# Patient Record
Sex: Female | Born: 1960
Health system: Southern US, Community
[De-identification: ages and names within clinical notes are randomized; demographics above are authoritative.]

## PROBLEM LIST (undated history)

## (undated) DIAGNOSIS — K219 Gastro-esophageal reflux disease without esophagitis: Secondary | ICD-10-CM

## (undated) DIAGNOSIS — G35 Multiple sclerosis: Secondary | ICD-10-CM

## (undated) DIAGNOSIS — K859 Acute pancreatitis without necrosis or infection, unspecified: Secondary | ICD-10-CM

## (undated) DIAGNOSIS — H9190 Unspecified hearing loss, unspecified ear: Secondary | ICD-10-CM

## (undated) DIAGNOSIS — H539 Unspecified visual disturbance: Secondary | ICD-10-CM

## (undated) DIAGNOSIS — K209 Esophagitis, unspecified without bleeding: Secondary | ICD-10-CM

## (undated) DIAGNOSIS — J45909 Unspecified asthma, uncomplicated: Secondary | ICD-10-CM

## (undated) DIAGNOSIS — R51 Headache: Secondary | ICD-10-CM

## (undated) DIAGNOSIS — I10 Essential (primary) hypertension: Secondary | ICD-10-CM

## (undated) DIAGNOSIS — G35D Multiple sclerosis, unspecified: Secondary | ICD-10-CM

## (undated) DIAGNOSIS — F1010 Alcohol abuse, uncomplicated: Secondary | ICD-10-CM

## (undated) DIAGNOSIS — R519 Headache, unspecified: Secondary | ICD-10-CM

## (undated) HISTORY — DX: Essential (primary) hypertension: I10

## (undated) HISTORY — DX: Gastro-esophageal reflux disease without esophagitis: K21.9

## (undated) HISTORY — DX: Multiple sclerosis: G35

## (undated) HISTORY — PX: CHOLECYSTECTOMY: SHX55

## (undated) HISTORY — PX: CARPAL TUNNEL RELEASE: SHX101

## (undated) HISTORY — DX: Unspecified hearing loss, unspecified ear: H91.90

## (undated) HISTORY — PX: ULNAR NERVE REPAIR: SHX2594

## (undated) HISTORY — DX: Multiple sclerosis, unspecified: G35.D

## (undated) HISTORY — DX: Headache, unspecified: R51.9

## (undated) HISTORY — PX: ANKLE FRACTURE SURGERY: SHX122

## (undated) HISTORY — DX: Unspecified visual disturbance: H53.9

## (undated) HISTORY — DX: Headache: R51

---

## 2009-08-20 HISTORY — PX: LAPAROSCOPIC GASTRIC SLEEVE RESECTION: SHX5895

## 2014-01-13 DIAGNOSIS — H9190 Unspecified hearing loss, unspecified ear: Secondary | ICD-10-CM | POA: Insufficient documentation

## 2014-01-13 DIAGNOSIS — M62838 Other muscle spasm: Secondary | ICD-10-CM | POA: Insufficient documentation

## 2014-02-16 DIAGNOSIS — G35 Multiple sclerosis: Secondary | ICD-10-CM | POA: Insufficient documentation

## 2014-10-06 ENCOUNTER — Encounter: Payer: Self-pay | Admitting: *Deleted

## 2014-10-06 ENCOUNTER — Telehealth: Payer: Self-pay | Admitting: Neurology

## 2014-10-06 ENCOUNTER — Encounter: Payer: Self-pay | Admitting: Neurology

## 2014-10-06 ENCOUNTER — Telehealth: Payer: Self-pay | Admitting: *Deleted

## 2014-10-06 ENCOUNTER — Ambulatory Visit (INDEPENDENT_AMBULATORY_CARE_PROVIDER_SITE_OTHER): Payer: BLUE CROSS/BLUE SHIELD | Admitting: Neurology

## 2014-10-06 VITALS — BP 166/104 | HR 70 | Resp 14 | Ht 65.0 in | Wt 183.0 lb

## 2014-10-06 DIAGNOSIS — Z79899 Other long term (current) drug therapy: Secondary | ICD-10-CM | POA: Insufficient documentation

## 2014-10-06 DIAGNOSIS — R200 Anesthesia of skin: Secondary | ICD-10-CM | POA: Insufficient documentation

## 2014-10-06 DIAGNOSIS — G2581 Restless legs syndrome: Secondary | ICD-10-CM | POA: Insufficient documentation

## 2014-10-06 DIAGNOSIS — G35 Multiple sclerosis: Secondary | ICD-10-CM | POA: Insufficient documentation

## 2014-10-06 DIAGNOSIS — Z9884 Bariatric surgery status: Secondary | ICD-10-CM | POA: Insufficient documentation

## 2014-10-06 DIAGNOSIS — R260 Ataxic gait: Secondary | ICD-10-CM | POA: Insufficient documentation

## 2014-10-06 DIAGNOSIS — Z9889 Other specified postprocedural states: Secondary | ICD-10-CM | POA: Insufficient documentation

## 2014-10-06 DIAGNOSIS — F418 Other specified anxiety disorders: Secondary | ICD-10-CM | POA: Insufficient documentation

## 2014-10-06 DIAGNOSIS — G4700 Insomnia, unspecified: Secondary | ICD-10-CM

## 2014-10-06 DIAGNOSIS — R4189 Other symptoms and signs involving cognitive functions and awareness: Secondary | ICD-10-CM | POA: Insufficient documentation

## 2014-10-06 DIAGNOSIS — I10 Essential (primary) hypertension: Secondary | ICD-10-CM | POA: Insufficient documentation

## 2014-10-06 MED ORDER — ROPINIROLE HCL 0.5 MG PO TABS: ORAL_TABLET | ORAL | Status: DC

## 2014-10-06 MED ORDER — FLUOXETINE HCL 20 MG PO CAPS: 20.0000 mg | ORAL_CAPSULE | Freq: Every day | ORAL | Status: DC

## 2014-10-06 MED ORDER — MODAFINIL 200 MG PO TABS: 200.0000 mg | ORAL_TABLET | Freq: Every day | ORAL | Status: DC

## 2014-10-06 MED ORDER — INTERFERON BETA-1A 44 MCG/0.5ML ~~LOC~~ SOSY: 44.0000 ug | PREFILLED_SYRINGE | SUBCUTANEOUS | Status: DC

## 2014-10-06 MED ORDER — VALSARTAN 80 MG PO TABS: 80.0000 mg | ORAL_TABLET | Freq: Every day | ORAL | Status: DC

## 2014-10-06 NOTE — Telephone Encounter (Signed)
Patient called and stated she uses [facility] at [address] [place].  FYI

## 2014-10-06 NOTE — Telephone Encounter (Signed)
Request fax to [facility]  On 10/06/14 requesting records and MRi CD.

## 2014-10-06 NOTE — Telephone Encounter (Signed)
Noted in [facility] [address] in [place]/fim

## 2014-10-06 NOTE — Progress Notes (Signed)
[facility]  PATIENT: Lucita Lora DOB: [birth year]  REFERRING DOCTOR OR PCP:  No PCP, transferring care SOURCE: Patient  _________________________________   HISTORICAL  CHIEF COMPLAINT:  Chief Complaint  Patient presents with  . Multiple Sclerosis    Sts. dx. with MS approx. 2009--first followed by neuro in [place], most recently followed by Dr. Renne Crigler at [facility].  Presenting sx. were random numbness, intermittent dizziness.  MS was an incidental finding when she had an mri to investigate hearing loss.  Sts. mri an lp confirmed dx.  She initially started Avonex--she stopped this due to se (flu-like sx), and she just didn't feel any better on Avonex.  She then tried Copaxone, doesn't remember why she stopped this.  Next she tried Rebif--sts. ? ins.   . Numbness    stopped covering this just before Christmas 2015, so she has not been on any MS therapy since December.   Sts. no exacerbation of sx. since she has been off of Rebif./fim    HISTORY OF PRESENT ILLNESS:  [name] is a 54 year old woman who was diagnosed with multiple sclerosis in 2009. Before that time, she had noted some gait ataxia but had not had any imaging studies. In 2009, due to progressive hearing loss, she had an MRI of the brain. It was abnormal and consistent with multiple sclerosis. She then had a lumbar puncture that also confirmed the diagnosis. She was in [place] at the time. I have some of her early MRI reports there are several foci noted in the cervical and thoracic spinal cord in the brain, there were multiple hyperintense foci, mostly in the periventricular deep white matter many of these were contrast enhancing on 07/26/2008. There was another enhancing lesion adjacent to C2 in the spinal cord. The spinal fluid showed an increased IgG synthesis rate and oligoclonal bands. Initially, she was placed on Avonex. However, she did not feel good on that and was still in some denial about the MS  diagnosis so she stopped. She then tried Copaxone but also stopped after a while. [place] and saw Dr. Renne Crigler at [facility]. She was placed on Rebif. She tolerates Rebif well. She had her last shot in December as she is having some insurance difficulties getting her reauthorization. She has not had any shots for the past 6 weeks. She thinks back over the last 15 years she has noted several episodes where she would have numbness for a few days. Some of these episodes would be in the abdomen and upper legs. She also noted some episodes where her walking was more clumsy. I have MRI reports from 12/04/2012 from [place]. It showed multiple foci in the spinal cord at C2, C3-C4, and C4-C5. There were also foci at T4, T7, T9 and T10. The MRI of the brain showed 2 nonenhancing foci not present from studies in 2011. She reports having an MRI at [facility] sometime last year but we do not have those films.  Currently, she has several symptoms related to her multiple sclerosis. She reports gait ataxia, right leg weakness, tingling in her hands and face, fatigue, visual changes and mild cognitive impairment.  For many years she has felt her gait has been clumsy. At times this is worse. Her right leg seems to be weak at times and that contributes to some of her difficulties with her walking. She notes that sometimes she feels like she is being pushed to the left or the right  as she walks and may hit a piece of furniture or doorframe. Other times, she has noted that she has a right foot drop. Her left leg seems stronger. She notes some mild generalized weakness in her arms. Specifically, lifting something like a gallon of milk is more difficult than it used to be  She has noted tingling in her hands and face. The current numbness started yesterday and she gets episodes lasting a few days at a time several times a year. She does not have any painful numbness.  She has urinary incontinence and often  wears pads. She gets some frequency but also notes that there can be hesitancy. Often, after she urinates she feels she has to urinate again. She does not think that she completely empties the bladder. She has some nocturia.  She has noted some visual blurring that is usually worse in the afternoons. She might note difficulty with vision but then the next day she feels vision is better again. This is fairly symmetric. She has had some episodes of eye pain but it does not appear to be related to the visual changes.  She reports a lot of difficulty with cognition. Specifically she has some short-term memory issues and also has quite a bit of verbal fluency problems having difficulty coming up with the right word. She feels in a "fog" and notes that she is apathetic and has little ambition. She does not always complete tasks that she starts.  She notes problems with fatigue. This appears to be both cognitive and physical. Provigil in the past and felt that it helped both her cognitive and physical fatigue. However, she has had some difficulty getting it with insurance. She was tried on Ritalin once. She felt it helped her focus but she did not like the way that she felt when she took it.  She notes difficulty falling asleep and notes that she has restless legs. This will keep her up many nights. Gabapentin was not well tolerated and made her irritable. She was placed on a small pill recently specifically for restless legs and found that it helped some. She does not know the name of the medication.  She reports having depression and anxiety. She is very apathetic and worries about a lot of things.  She has been tried on Effexor and Celexa without much benefit.   She drinks alcohol about 2-8 drinks a day. She was drinking more after her son died a few years ago and feels she is doing better.  She has a history of a gastric bypass surgery in 2012.   She had hypertension before the gastric bypass surgery that  improved afterwards. She has not been on blood pressure medicine for several years.    REVIEW OF SYSTEMS: Constitutional: No fevers, chills, sweats, or change in appetite.    She reports fatigue and insomnia Eyes: see above.  No eye pain Ear, nose and throat: No hearing loss, ear pain, nasal congestion, sore throat Cardiovascular: No chest pain, palpitations Respiratory: No shortness of breath at rest or with exertion.   Some wheezes, once on asthma med's Gastrointestinal: No nausea, vomiting, diarrhea, abdominal pain, fecal incontinence. Genitourinary: see above Musculoskeletal: No neck pain, back pain Integumentary: No rash, pruritus, skin lesions Neurological: as above Psychiatric: as above Endocrine: No palpitations, diaphoresis, change in appetite, change in weigh or increased thirst.  Often feels cold Hematologic/Lymphatic: No anemia, purpura, petechiae. Allergic/Immunologic: No itchy/runny eyes, nasal congestion, recent allergic reactions, rashes  ALLERGIES: Allergies  Allergen  Reactions  . Percocet [Oxycodone-Acetaminophen] Nausea And Vomiting    HOME MEDICATIONS: No current outpatient prescriptions on file.  PAST MEDICAL HISTORY: Past Medical History  Diagnosis Date  . Multiple sclerosis   . Vision abnormalities   . Hypertension   . Headache   . Hearing loss     PAST SURGICAL HISTORY: Past Surgical History  Procedure Laterality Date  . Carpal tunnel release Bilateral   . Ulnar nerve repair Bilateral   . Bariatric sleeve    . Ankle fracture surgery Bilateral     FAMILY HISTORY: Family History  Problem Relation Age of Onset  . Cancer Mother   . Stroke Mother   . Cancer Father     SOCIAL HISTORY:  History   Social History  . Marital Status: Married    Spouse Name: N/A  . Number of Children: N/A  . Years of Education: N/A   Occupational History  . Not on file.   Social History Main Topics  . Smoking status: Former Games developer  . Smokeless  tobacco: Not on file  . Alcohol Use: Not on file     Comment: Sts. she drinks beer, liquor daily, varying amts/fim  . Drug Use: No  . Sexual Activity: No   Other Topics Concern  . Not on file   Social History Narrative  . No narrative on file     PHYSICAL EXAM  Filed Vitals:   10/06/14 1007  BP: 166/104  Pulse: 70  Resp: 14  Height: 5\' 5"  (1.651 m)  Weight: 183 lb (83.008 kg)    Body mass index is 30.45 kg/(m^2).   General: The patient is well-developed and well-nourished and in no acute distress  Eyes:  Funduscopic exam shows no papilledema or optic nerve pallor and retinal vessels are normal.  Neck: The neck is supple, no carotid bruits are noted.  The neck is non tender.  Cardiovascular: The heart has a regular rate and rhythm with a normal S1 and S2. There were no murmurs, gallops or rubs. Lungs are clear to auscultation.  Skin: Extremities are without significant edema.  Musculoskeletal:  Back is non tender  Neurologic Exam  Mental status: The patient is alert and oriented x 3 at the time of the examination. The patient has apparent normal recent and remote memory, with an apparently normal attention span and concentration ability.   Speech is normal.  Cranial nerves: Extraocular movements are full. Pupils are equal, round, and reactive to light and accomodation.  Visual fields are full.  Facial symmetry is present. There is reduced right facial sensation to soft touch.Facial strength is normal.  Trapezius and sternocleidomastoid strength is normal. No dysarthria is noted.  The tongue is midline, and the patient has symmetric elevation of the soft palate. She has reduced hearing on the right and Weber lateralizes right.  .  Motor:  Muscle bulk is normal.   Tone is normal. Strength is  5 / 5 in all 4 extremities.   Sensory: Sensory testing is intact to pinprick, soft touch sensation in all 4 extremities.  In arms, decreased vib sensation on the right and in legs,  decreased sensation on the right.   Further mild decrease vib superimposed both feet.    Coordination: Cerebellar testing reveals good finger-nose-finger and heel-to-shin bilaterally.  Gait and station: Station is normal.   Gait is minimally wide. Tandem gait is mildly wide. Romberg is negative.   Reflexes: Deep tendon reflexes are symmetric and normal bilaterally.   Plantar  responses are flexor.    DIAGNOSTIC DATA (LABS, IMAGING, TESTING) - I reviewed patient records, labs, notes, testing and imaging myself where available.     ASSESSMENT AND PLAN  Multiple sclerosis - Plan: Vitamin B12, Copper, Serum, CMP  Numbness  Ataxic gait - Plan: Vitamin B12, Copper, Serum  High risk medication use  Gastric bypass status for obesity - Plan: Vitamin B12, Copper, Serum, CMP, Ferritin  Cognitive changes  Depression with anxiety  Restless leg - Plan: Ferritin  Insomnia  Essential hypertension - Plan: CMP   In summary, [name] is a 54 year old woman with multiple sclerosis who was diagnosed in 2009 but likely had disease for at least a decade earlier. She feels she has been stable on Rebif and would like to get back on that medication. We will have her fill out a service request form and try to get this authorized. Any symptoms related to her MS that were addressed today. Her main problems are fatigue, cognitive impairment, gait disorder and numbness. I will have her get back on Provigil as she has benefited from this in the past. Over this will help both the fatigue and the cognitive impairment. Her gait disorder is not bad enough to recommend use of a cane or walker at this time but we will continue to evaluate this. She is advised to be careful special on uneven surfaces. He has some bladder dysfunction but would prefer not to start another medication at this time. She also has restless leg syndrome which might or might not be associated with her MS. I will check a ferritin level and  have her try ropinirole. She has mood disturbances and mild alcohol abuse. We addressed the alcohol issue and I advised her trying to limit her drinking more. I'll start Prozac and adjust as needed. Her numbness could be related to MS or to other factors. I will try to get the actual images of her MRI's from last year. Additionally because of her gastric bypass surgery in the past, she is at higher risk for deficiencies and we will check a B12 and copper today. She has untreated essential hypertension and I will start Diovan. I strongly encouraged her to get a local primary care provider to follow her hypertension is well as her history of asthma.  70 minute face-to-face evaluation with greater than half of the time counseling and coordinating care about her MS and related symptoms.  She will return to see me in 3-4 months or sooner if she has new or worsening neurologic symptoms.   Tennyson Wacha A. Epimenio Foot, MD, PhD 10/06/2014, 10:16 AM Certified in Neurology, Clinical Neurophysiology, Sleep Medicine, Pain Medicine and Neuroimaging  [facility] [address], [address] [place], [place] [address] [phone]   .

## 2014-10-09 LAB — COMPREHENSIVE METABOLIC PANEL
ALK PHOS: 70 IU/L (ref 39–117)
ALT: 13 IU/L (ref 0–32)
AST: 22 IU/L (ref 0–40)
Albumin/Globulin Ratio: 1.7 (ref 1.1–2.5)
Albumin: 4.3 g/dL (ref 3.5–5.5)
BUN / CREAT RATIO: 20 (ref 9–23)
BUN: 13 mg/dL (ref 6–24)
Bilirubin Total: 0.7 mg/dL (ref 0.0–1.2)
CHLORIDE: 100 mmol/L (ref 97–108)
CO2: 29 mmol/L (ref 18–29)
Calcium: 9.6 mg/dL (ref 8.7–10.2)
Creatinine, Ser: 0.66 mg/dL (ref 0.57–1.00)
GFR calc Af Amer: 116 mL/min/{1.73_m2} (ref >59)
GFR calc non Af Amer: 100 mL/min/{1.73_m2} (ref >59)
GLOBULIN, TOTAL: 2.5 g/dL (ref 1.5–4.5)
GLUCOSE: 87 mg/dL (ref 65–99)
Potassium: 4.2 mmol/L (ref 3.5–5.2)
SODIUM: 141 mmol/L (ref 134–144)
Total Protein: 6.8 g/dL (ref 6.0–8.5)

## 2014-10-09 LAB — VITAMIN B12

## 2014-10-09 LAB — FERRITIN: Ferritin: 57 ng/mL (ref 15–150)

## 2014-10-09 LAB — COPPER, SERUM: COPPER: 149 ug/dL (ref 72–166)

## 2014-10-11 ENCOUNTER — Telehealth: Payer: Self-pay | Admitting: Neurology

## 2014-10-11 ENCOUNTER — Telehealth: Payer: Self-pay | Admitting: *Deleted

## 2014-10-11 NOTE — Telephone Encounter (Signed)
LMTC./fim 

## 2014-10-11 NOTE — Telephone Encounter (Signed)
Spoke with Corrie Dandy and per RAS, adivised labwork is normal.  She verbalized understanding of same/fim

## 2014-10-11 NOTE — Telephone Encounter (Signed)
Info has been sent to ins.  Request is currently pending.  I called back. Got no answer.

## 2014-10-11 NOTE — Telephone Encounter (Signed)
Patient is returning Faith's call in regard to test results.  Please call.  Thanks!

## 2014-10-11 NOTE — Telephone Encounter (Signed)
Patient is calling in regard to Rx Provigal 20mg  once daily. Needs authoriztion from doctor for insurance [employer] to fill. Please call.

## 2014-10-12 ENCOUNTER — Telehealth: Payer: Self-pay

## 2014-10-12 NOTE — Telephone Encounter (Signed)
[employer] FEP has approved the request for coverage on Provigil effective until 10/12/2015 Ref Key: [id number] I called patient to advise.  Got no answer.  Left message.

## 2014-10-27 ENCOUNTER — Telehealth: Payer: Self-pay | Admitting: Neurology

## 2014-10-27 MED ORDER — MODAFINIL 200 MG PO TABS: ORAL_TABLET | ORAL | Status: DC

## 2014-10-27 NOTE — Telephone Encounter (Signed)
Spoke with Catherine Hubbard and per RAS, advised she increase Modafinil to 1 tab in the am and 1/2 tab at noon.  She is agreeable--rx. printed, signed, in Cassandra's office to go up front for pick up/fim

## 2014-10-27 NOTE — Telephone Encounter (Signed)
Try one in am and 1/2 at noon

## 2014-10-27 NOTE — Telephone Encounter (Signed)
Patient stated Rx modafinil (PROVIGIL) 200 MG tablet isn't helping.  Requesting an increase in dosage.  Please call and advise.

## 2014-12-25 ENCOUNTER — Emergency Department (HOSPITAL_COMMUNITY)
Admission: EM | Admit: 2014-12-25 | Discharge: 2014-12-25 | Disposition: A | Discharge status: Home / Self Care | Payer: Federal, State, Local not specified - PPO | Attending: Emergency Medicine | Admitting: Emergency Medicine

## 2014-12-25 ENCOUNTER — Encounter (HOSPITAL_COMMUNITY): Payer: Self-pay | Admitting: *Deleted

## 2014-12-25 ENCOUNTER — Emergency Department (HOSPITAL_COMMUNITY): Payer: Federal, State, Local not specified - PPO

## 2014-12-25 DIAGNOSIS — H9190 Unspecified hearing loss, unspecified ear: Secondary | ICD-10-CM | POA: Diagnosis not present

## 2014-12-25 DIAGNOSIS — Z79899 Other long term (current) drug therapy: Secondary | ICD-10-CM | POA: Diagnosis not present

## 2014-12-25 DIAGNOSIS — I10 Essential (primary) hypertension: Secondary | ICD-10-CM | POA: Diagnosis not present

## 2014-12-25 DIAGNOSIS — Z87891 Personal history of nicotine dependence: Secondary | ICD-10-CM | POA: Diagnosis not present

## 2014-12-25 DIAGNOSIS — M79672 Pain in left foot: Secondary | ICD-10-CM | POA: Diagnosis not present

## 2014-12-25 DIAGNOSIS — G35 Multiple sclerosis: Secondary | ICD-10-CM | POA: Diagnosis not present

## 2014-12-25 MED ORDER — HYDROCODONE-ACETAMINOPHEN 5-325 MG PO TABS: 1.0000 | ORAL_TABLET | Freq: Four times a day (QID) | ORAL | Status: DC | PRN

## 2014-12-25 NOTE — ED Notes (Signed)
The pt is c/o  Pain in her lt foot for 1-2 weeks after wearing  A size smaller flip-flop.  She cannot get rid of the pain

## 2014-12-25 NOTE — ED Provider Notes (Signed)
CSN: [id number]     Arrival date & time 12/25/14  1714 History  This chart was scribed for a non-physician practitioner, Roxy Horseman, PA-C working with Donnetta Hutching, MD by [place] Peace, ED Scribe. The patient was seen in [facility]. The patient's care was started at 5:42 PM.    Chief Complaint  Patient presents with  . Foot Pain      Patient is a 54 y.o. female presenting with lower extremity pain. The history is provided by the patient. No language interpreter was used.  Foot Pain    HPI Comments: Catherine Hubbard is a 54 y.o. female who presents to the Emergency Department complaining of intermittent left foot pain x 1-2 weeks that has gotten progressively worse. She notes pain is specifically to the top her foot and exacerbated with applied pressure and flexion of affected area. She denies any recent traumas or known mechanisms of injury. Pt explains that she has tried using heat and ice compressions, as well as different stretches and massages of affected area without relief. She reports history of bone spur to affected foot. Pt is former smoker.    Past Medical History  Diagnosis Date  . Multiple sclerosis   . Vision abnormalities   . Hypertension   . Headache   . Hearing loss    Past Surgical History  Procedure Laterality Date  . Carpal tunnel release Bilateral   . Ulnar nerve repair Bilateral   . Bariatric sleeve    . Ankle fracture surgery Bilateral    Family History  Problem Relation Age of Onset  . Cancer Mother   . Stroke Mother   . Cancer Father    History  Substance Use Topics  . Smoking status: Former Games developer  . Smokeless tobacco: Not on file  . Alcohol Use: Not on file     Comment: Sts. she drinks beer, liquor daily, varying amts/fim   OB History    No data available     Review of Systems  Constitutional: Negative for fever and chills.  Gastrointestinal: Negative for nausea and vomiting.  Musculoskeletal: Positive for arthralgias.       Left  foot pain.       Allergies  Percocet  Home Medications   Prior to Admission medications   Medication Sig Start Date End Date Taking? Authorizing Provider  FLUoxetine (PROZAC) 20 MG capsule Take 1 capsule (20 mg total) by mouth daily. 10/06/14   Asa Lente, MD  interferon beta-1a (REBIF) 44 MCG/0.5ML SOSY injection Inject 0.5 mLs (44 mcg total) into the skin 3 (three) times a week. 10/06/14   Asa Lente, MD  Interferon Beta-1a 44 MCG/0.5ML SOAJ Inject 44 mcg into the skin.    Historical Provider, MD  modafinil (PROVIGIL) 200 MG tablet Take 1 tablet in the morning and 1/2 tablet at noon. 10/27/14   Asa Lente, MD  rOPINIRole (REQUIP) 0.5 MG tablet One or two at bedtime 10/06/14   Asa Lente, MD  valsartan (DIOVAN) 80 MG tablet Take 1 tablet (80 mg total) by mouth daily. 10/06/14   Asa Lente, MD   BP 96/66 mmHg  Pulse 86  Temp(Src) 98.9 F (37.2 C) (Oral)  Resp 18  SpO2 97% Physical Exam  Constitutional: She is oriented to person, place, and time. She appears well-developed and well-nourished. No distress.  HENT:  Head: Normocephalic and atraumatic.  Eyes: Conjunctivae and EOM are normal.  Neck: Neck supple. No tracheal deviation present.  Cardiovascular: Normal rate  and intact distal pulses.   Intact distal pulses with brisk capillary refill  Pulmonary/Chest: Effort normal. No respiratory distress.  Musculoskeletal: Normal range of motion.  Left foot nontender to palpation, no bony abnormality or deformity, range of motion strength 5/5  Neurological: She is alert and oriented to person, place, and time.  Sensation and strength intact  Skin: Skin is warm and dry.  No erythema, no evidence of cellulitis or infectious process  Psychiatric: She has a normal mood and affect. Her behavior is normal.  Nursing note and vitals reviewed.   ED Course  Procedures (including critical care time) Labs Review Labs Reviewed - No data to display  Imaging Review Dg  Foot Complete Left  12/25/2014   CLINICAL DATA:  Generalized LEFT foot pain for 5 days since wearing new flipflops, no specific injury  EXAM: LEFT FOOT - COMPLETE 3+ VIEW  COMPARISON:  None  FINDINGS: Osseous mineralization grossly normal.  Joint spaces preserved.  Question prior surgery or trauma to medial malleolus.  On AP view curvilinear lucency is identified at the shaft of the first metatarsal, uncertain etiology ; no definite cortical disruption is identified.  No definite acute fracture, dislocation or bone destruction.  Degenerative changes at the TMT joints.  Plantar calcaneal spur.  IMPRESSION: Calcaneal spurring.  Degenerative changes at TMT joints.  Curvilinear lucency at the first metatarsal shaft, without cortical disruption, favor artifact of patient has pain at this site, consider MR imaging or radiographic followup.  No additional focal bony abnormalities.   Electronically Signed   By: Ulyses Southward M.D.   On: 12/25/2014 19:07     EKG Interpretation None     Medications - No data to display  5:45 PM- Treatment plan was discussed with patient who verbalizes understanding and agrees.   MDM   Final diagnoses:  Left foot pain    Patient with left foot pain. Plain films are negative for acute process, but does show degenerative changes. Could be nerve or overuse injury. Will give postop shoe, treat pain, and recommend orthopedic follow-up. Patient understands and agrees the plan. She is stable and ready for discharge.  I personally performed the services described in this documentation, which was scribed in my presence. The recorded information has been reviewed and is accurate.     Roxy Horseman, PA-C 12/25/14 1924  Donnetta Hutching, MD 12/26/14 [phone]

## 2014-12-25 NOTE — Discharge Instructions (Signed)

## 2015-01-11 ENCOUNTER — Encounter: Payer: Self-pay | Admitting: Neurology

## 2015-01-11 ENCOUNTER — Ambulatory Visit (INDEPENDENT_AMBULATORY_CARE_PROVIDER_SITE_OTHER): Payer: Federal, State, Local not specified - PPO | Admitting: Neurology

## 2015-01-11 VITALS — BP 136/90 | HR 82 | Resp 14 | Ht 65.0 in | Wt 173.6 lb

## 2015-01-11 DIAGNOSIS — R208 Other disturbances of skin sensation: Secondary | ICD-10-CM | POA: Diagnosis not present

## 2015-01-11 DIAGNOSIS — G2581 Restless legs syndrome: Secondary | ICD-10-CM

## 2015-01-11 DIAGNOSIS — G35 Multiple sclerosis: Secondary | ICD-10-CM

## 2015-01-11 DIAGNOSIS — R3915 Urgency of urination: Secondary | ICD-10-CM

## 2015-01-11 DIAGNOSIS — F418 Other specified anxiety disorders: Secondary | ICD-10-CM | POA: Diagnosis not present

## 2015-01-11 DIAGNOSIS — G4700 Insomnia, unspecified: Secondary | ICD-10-CM | POA: Diagnosis not present

## 2015-01-11 DIAGNOSIS — R5383 Other fatigue: Secondary | ICD-10-CM | POA: Diagnosis not present

## 2015-01-11 MED ORDER — AMITRIPTYLINE HCL 25 MG PO TABS: ORAL_TABLET | ORAL | Status: DC

## 2015-01-11 MED ORDER — HYDROCODONE-ACETAMINOPHEN 5-325 MG PO TABS: 1.0000 | ORAL_TABLET | Freq: Every day | ORAL | Status: DC | PRN

## 2015-01-11 NOTE — Progress Notes (Signed)
[facility]  PATIENT: Catherine Hubbard DOB: 04-04-1961  REFERRING DOCTOR OR PCP:  No PCP, transferring care SOURCE: Patient  _________________________________   HISTORICAL  CHIEF COMPLAINT:  Chief Complaint  Patient presents with  . Multiple Sclerosis    Sts. she tolerates Rebif well.  Today she has new c/o left foot pain with sudden onset in early May.  She thought pain was due to ill-fitting flip flops.  She was seen at [facility] ED for same,  X-rays were neg.  she was referred to Ortho--Dr. Roda Shutters, who rx'd Prednisone 10mg  dose pk and told her pain was probably neuropathic and referred her back to Dr. Epimenio Foot.  Mary sts. no relief of pain with Prednisone.  She also sts. h/a'a are more frequent and more severe.   Associated with visual disturbance,   . Foot Pain    photophobia.  Sts. she has been under more stress at home, knows she is staying tense in shoulders and neck.  Sts. no relief with Tylenol but Vicodin helps./fim  . Headaches    HISTORY OF PRESENT ILLNESS:  Catherine Hubbard is a 54 year old woman with MS.   Since the last visit, she has had a lot of left foot pain and she went to the ER.      Pain started one month ago and has progressively worsened.     Pain is on the top of the foot.   More recently, she has also noted buttock and back pain.    Pain worsens randomly.  No definite trigger and there was no definite preceding event.    Heat and ice have not helped.   Nothing really makes it better or worse.,     She went to the ER 12/25/14 and was referred to orthopedics and they told her to come back to see me as it was probably a nerve issue.        She has several other symptoms related to her multiple sclerosis. She reports gait ataxia, right leg weakness, tingling in her hands and face, fatigue, visual changes and mild cognitive impairment.  Gait/strength/sensation:   Gait is  Clumsy and at times the right leg seems to be weak .  Sometimes she feels like she  is being pushed to the left or the right as she walks.  Sometimes, she has noted that she has a right foot drop. Her left leg seems stronger. She notes some mild generalized weakness in her arms.  She has tingling in her hands and face.   More recently, the eft foot has been numb and painful.    Bladder:   She has urinary frequency and occasional incontinence.  Often, after she urinates she feels she has to urinate agai. She has some nocturia.  Vision:   She has noted some visual blurring that is usually worse in the afternoons. No diplopia  Mood/cognition:   She reports depression and anxiety. She is very apathetic and worries about a lot of things.  She gets some benefit from Prozac.    She drinks alcohol several drinks a day. She was drinking more after her son died a few years ago and feels she is doing better..  She reports difficulty with cognition. Specifically she has some short-term memory issues and also has quite a bit of verbal fluency problems having difficulty coming up with the right word.  She does not always complete tasks that she starts.  Fatigue/sleep:   She has a lot of  fatigue. This appears to be both cognitive and physical. Provigil helps some and she increased the dose to 1 1/2 a day.  She was tried on Ritalin once. She felt it helped her focus but she did not like the way that she felt when she took it.  She notes difficulty falling asleep and notes that she has restless legs. This will keep her up many nights. Gabapentin was not well tolerated and made her irritable. Ropinirole helps RLS some  MS History:   She was diagnosed with multiple sclerosis in 2009. Before that time, she had noted some gait ataxia but had not had any imaging studies. In 2009, due to progressive hearing loss, she had an MRI of the brain and  then had a lumbar puncture that also confirmed the diagnosis.  I have some of her early MRI reports there are several foci noted in the cervical and thoracic spinal  cord in the brain, there were multiple hyperintense foci, mostly in the periventricular deep white matter many of these were contrast enhancing on 07/26/2008. There was another enhancing lesion adjacent to C2 in the spinal cord.  Initially, she was placed on Avonex. She did not feel good and she stopped. She then tried Copaxone but also stopped after a while. She moved here in 2014 and saw Dr. Renne Crigler at [facility]. She was placed on Rebif. She tolerates Rebif well.  MRI reports from 12/04/2012  showed multiple foci in the spinal cord at C2, C3-C4, and C4-C5. There were also foci at T4, T7, T9 and T10. The MRI of the brain showed 2 nonenhancing foci not present from studies in 2011. She reports having an MRI at [facility] 2015 but we do not have those films.   REVIEW OF SYSTEMS: Constitutional: No fevers, chills, sweats, or change in appetite.    She reports fatigue and insomnia Eyes: see above.  No eye pain Ear, nose and throat: No hearing loss, ear pain, nasal congestion, sore throat Cardiovascular: No chest pain, palpitations Respiratory: No shortness of breath at rest or with exertion.   Some wheezes, once on asthma med's Gastrointestinal: No nausea, vomiting, diarrhea, abdominal pain, fecal incontinence. Genitourinary: see above Musculoskeletal: No neck pain, back pain Integumentary: No rash, pruritus, skin lesions Neurological: as above Psychiatric: as above Endocrine: No palpitations, diaphoresis, change in appetite, change in weigh or increased thirst.  Often feels cold Hematologic/Lymphatic: No anemia, purpura, petechiae. Allergic/Immunologic: No itchy/runny eyes, nasal congestion, recent allergic reactions, rashes  ALLERGIES: Allergies  Allergen Reactions  . Percocet [Oxycodone-Acetaminophen] Nausea And Vomiting    HOME MEDICATIONS:  Current outpatient prescriptions:  .  FLUoxetine (PROZAC) 20 MG capsule, Take 1 capsule (20 mg total) by mouth daily., Disp: 30 capsule, Rfl:  11 .  HYDROcodone-acetaminophen (NORCO/VICODIN) 5-325 MG per tablet, Take 1-2 tablets by mouth every 6 (six) hours as needed., Disp: 13 tablet, Rfl: 0 .  Interferon Beta-1a 44 MCG/0.5ML SOAJ, Inject 44 mcg into the skin., Disp: , Rfl:  .  modafinil (PROVIGIL) 200 MG tablet, Take 1 tablet in the morning and 1/2 tablet at noon., Disp: 45 tablet, Rfl: 5 .  rOPINIRole (REQUIP) 0.5 MG tablet, One or two at bedtime, Disp: 60 tablet, Rfl: 11 .  valsartan (DIOVAN) 80 MG tablet, Take 1 tablet (80 mg total) by mouth daily., Disp: 30 tablet, Rfl: 11  PAST MEDICAL HISTORY: Past Medical History  Diagnosis Date  . Multiple sclerosis   . Vision abnormalities   . Hypertension   . Headache   .  Hearing loss     PAST SURGICAL HISTORY: Past Surgical History  Procedure Laterality Date  . Carpal tunnel release Bilateral   . Ulnar nerve repair Bilateral   . Bariatric sleeve    . Ankle fracture surgery Bilateral     FAMILY HISTORY: Family History  Problem Relation Age of Onset  . Cancer Mother   . Stroke Mother   . Cancer Father     SOCIAL HISTORY:  History   Social History  . Marital Status: Married    Spouse Name: N/A  . Number of Children: N/A  . Years of Education: N/A   Occupational History  . Not on file.   Social History Main Topics  . Smoking status: Former Games developer  . Smokeless tobacco: Not on file  . Alcohol Use: Not on file     Comment: Sts. she drinks beer, liquor daily, varying amts/fim  . Drug Use: No  . Sexual Activity: No   Other Topics Concern  . Not on file   Social History Narrative     PHYSICAL EXAM  Filed Vitals:   01/11/15 1308  BP: 136/90  Pulse: 82  Resp: 14  Height:  (1.651 m)  Weight: 173 lb 9.6 oz (78.744 kg)    Body mass index is 28.89 kg/(m^2).   General: The patient is well-developed and well-nourished and in no acute distress  Neck: The neck is supple, no carotid bruits are noted.  The neck is non tender.  Skin: Extremities are  without significant edema.  Musculoskeletal:  Back is non tender  Neurologic Exam  Mental status: The patient is alert and oriented x 3 at the time of the examination. The patient has apparent normal recent and remote memory, with an apparently normal attention span and concentration ability.   Speech is normal.  Cranial nerves: Extraocular movements are full. Pupils are equal, round, and reactive to light and accomodation.  Visual fields are full.  Facial symmetry is present. There is reduced right facial sensation to soft touch.Facial strength is normal.  Trapezius and sternocleidomastoid strength is normal. No dysarthria is noted.  The tongue is midline, and the patient has symmetric elevation of the soft palate. She has reduced hearing on the right and Weber lateralizes right.  .  Motor:  Muscle bulk is normal.   Tone is normal. Strength is  5 / 5 in all 4 extremities.   Sensory:  In arms, she has decreased vibration sensation on the right.  In legs, mild decreased sensation to touch in a peroneal distribution on the right.   Further mild decrease vib superimposed both feet.    Coordination: Cerebellar testing reveals good finger-nose-finger and heel-to-shin bilaterally.  Gait and station: Station is normal.   Gait is minimally wide. Tandem gait is mildly wide. Romberg is negative.   Reflexes: Deep tendon reflexes are symmetric and normal bilaterally.   Plantar responses are flexor.    DIAGNOSTIC DATA (LABS, IMAGING, TESTING) - I reviewed patient records, labs, notes, testing and imaging myself where available.     ASSESSMENT AND PLAN  Multiple sclerosis  Insomnia  Depression with anxiety  Other fatigue  Restless leg  Dysesthesia  Urinary urgency   1.   Add amitriptyline 25 mg and increase to 50 mg if well tolerated.   Hopefully will help dysesthetic pain, sleep and bladder at night.  If she does not improve, consider any MRI of the lumbar spine and EMG/NCV testing.  I'll also write for a few  more hydrocodone tablets since it may take 2 weeks for the amitriptyline to kick in. 2.    Continue Rebif.   We will need to get an MRI to assess for subclinical progression later in year.    3.    Continue modafinil for fatigue/sleepiness and ropinirole for RLS (prn) She will return to see me in 3-4 months or sooner if she has new or worsening neurologic symptoms.   Karanveer Ramakrishnan A. Epimenio Foot, MD, PhD 01/11/2015, 1:33 PM Certified in Neurology, Clinical Neurophysiology, Sleep Medicine, Pain Medicine and Neuroimaging  [facility] [address] [place], [place] [address] [phone]   .

## 2015-02-04 ENCOUNTER — Ambulatory Visit: Payer: BLUE CROSS/BLUE SHIELD | Admitting: Neurology

## 2015-03-08 ENCOUNTER — Telehealth: Payer: Self-pay | Admitting: *Deleted

## 2015-03-08 NOTE — Telephone Encounter (Signed)
LMTC.  I spoke with Ezzie Dural (Rebif nurse) yesterday--she saw Catherine Hubbard for f/u yesterday and sts. Catherine Hubbard still feels depressed, despite taking Prozac and Amitriptyline.  Catherine Hubbard is scheduled for a f/u with Dr. Epimenio Foot, but I would like to offer her a sooner appt. to discuss depression./fim

## 2015-03-10 NOTE — Telephone Encounter (Signed)
Patient returned call and requested to speak with Faith RN. Please call and advise.  °

## 2015-03-10 NOTE — Telephone Encounter (Signed)
LMTC./fim 

## 2015-03-11 NOTE — Telephone Encounter (Signed)
LMTC./fim 

## 2015-03-14 NOTE — Telephone Encounter (Signed)
LMTC./fim 

## 2015-03-15 NOTE — Telephone Encounter (Signed)
I have spoken with Catherine Hubbard.  She sts. her depression is worse, partly due to children and grandchildren moving out of state. No SI or HI expressed.   She is agreeable to appt. with RAS to assess need for med change or psych referral.  I offered appt. with RAS tomorrow, but she is working. (part time Theatre stage manager at [employer]).   Appt. given for 03-21-15, and she will call me if she needs anything before then/fim

## 2015-03-21 ENCOUNTER — Ambulatory Visit (INDEPENDENT_AMBULATORY_CARE_PROVIDER_SITE_OTHER): Payer: Federal, State, Local not specified - PPO | Admitting: Neurology

## 2015-03-21 ENCOUNTER — Encounter: Payer: Self-pay | Admitting: Neurology

## 2015-03-21 VITALS — BP 128/91 | HR 86 | Temp 97.8°F | Ht 65.0 in | Wt 172.4 lb

## 2015-03-21 DIAGNOSIS — R260 Ataxic gait: Secondary | ICD-10-CM

## 2015-03-21 DIAGNOSIS — R208 Other disturbances of skin sensation: Secondary | ICD-10-CM | POA: Diagnosis not present

## 2015-03-21 DIAGNOSIS — G2581 Restless legs syndrome: Secondary | ICD-10-CM | POA: Diagnosis not present

## 2015-03-21 DIAGNOSIS — M5416 Radiculopathy, lumbar region: Secondary | ICD-10-CM

## 2015-03-21 DIAGNOSIS — R5383 Other fatigue: Secondary | ICD-10-CM

## 2015-03-21 DIAGNOSIS — F418 Other specified anxiety disorders: Secondary | ICD-10-CM | POA: Diagnosis not present

## 2015-03-21 DIAGNOSIS — G35 Multiple sclerosis: Secondary | ICD-10-CM | POA: Diagnosis not present

## 2015-03-21 DIAGNOSIS — R4189 Other symptoms and signs involving cognitive functions and awareness: Secondary | ICD-10-CM

## 2015-03-21 DIAGNOSIS — R3915 Urgency of urination: Secondary | ICD-10-CM

## 2015-03-21 MED ORDER — BUPROPION HCL ER (XL) 300 MG PO TB24: 300.0000 mg | ORAL_TABLET | Freq: Every day | ORAL | Status: DC

## 2015-03-21 NOTE — Progress Notes (Signed)
[facility]  PATIENT: Catherine Hubbard DOB: 11/03/1960  REFERRING DOCTOR OR PCP:  No PCP, transferring care SOURCE: Patient  _________________________________   HISTORICAL  CHIEF COMPLAINT:  Chief Complaint  Patient presents with  . Follow-up    Here by herself in Room 5. Rebif is working well per pt. Wants to discuss worsening depression. No motivation and does not feel like doing anything. She did clean her house before coming to appt today.     HISTORY OF PRESENT ILLNESS:  Catherine Hubbard is a 54 year old woman with MS ho is reporting more depression.    She also has dysesthetic left foot pain.  Amitriptyline has helped the dysesthetic pain some but she has noted strange dreams since starting.     Pain started 3 months ago.     Pain is on the top of the foot.   She now has more LBP and upper back pain.   More recently, she has also noted buttock and back pain.    Heat and ice have not helped.  It worsens randomly.     She is on Rebif for the MS and notes more pain in her mid back after her shots.      Gait/strength/sensation:   Gait is clumsy and at times the right leg has mild foot drop and she occasionally stumbles.    Sometimes she feels like she is being pushed to the left or the right as she walks.   She notes some mild generalized weakness in her arms.  She has tingling in her hands and face.   More recently, the left foot has been numb and painful.    Bladder:   She has urinary frequency and no recent incontinence.  Often, after she urinates she feels she has to urinate agai. She has some nocturia.  Vision:   She has noted some visual blurring that is usually worse in the afternoons. No diplopia  Mood/cognition:   She reports more depression and anxiety. She is having a lot more apathy and worries about a lot of things.  She gets some benefit from Prozac.   We discussed considering counseling and she might be interested.   She drinks alcohol (6 beers or  more)  drinks a day.   She reports difficulty with cognition. Specifically she has some short-term memory issues and also has quite a bit of verbal fluency problems having difficulty coming up with the right word.  She does not always complete tasks that she starts.  Fatigue/sleep:   She has a lot of  fatigue. This appears to be both cognitive and physical. Provigil helps some and she increased the dose to 1 1/2 a day.  She was tried on Ritalin or Adderall once but she did not like the way that she felt when she took it.  She notes difficulty falling asleep and notes that she has restless legs. This will keep her up many nights. Gabapentin was not well tolerated and made her irritable. Ropinirole helps RLS some  Right ear pain:  She hears a ringing sensation in the right ear and also notes  MS History:   She was diagnosed with multiple sclerosis in 2009. Before that time, she had noted some gait ataxia but had not had any imaging studies. In 2009, due to progressive hearing loss, she had an MRI of the brain and  then had a lumbar puncture that also confirmed the diagnosis.  I have some of her early MRI reports  there are several foci noted in the cervical and thoracic spinal cord in the brain, there were multiple hyperintense foci, mostly in the periventricular deep white matter many of these were contrast enhancing on 07/26/2008. There was another enhancing lesion adjacent to C2 in the spinal cord.  Initially, she was placed on Avonex. She did not feel good and she stopped. She then tried Copaxone but also stopped after a while. She moved here in 2014 and saw Dr. Renne Crigler at [facility]. She was placed on Rebif. She tolerates Rebif well.      MRI reports from 12/04/2012  showed multiple foci in the spinal cord at C2, C3-C4, and C4-C5. There were also foci at T4, T7, T9 and T10. The MRI of the brain showed 2 nonenhancing foci not present from studies in 2011. She reports having an MRI at [facility] 2015 but we do  not have those films.   REVIEW OF SYSTEMS: Constitutional: No fevers, chills, sweats, or change in appetite.    She reports fatigue and insomnia Eyes: see above.  No eye pain Ear, nose and throat: No hearing loss, ear pain, nasal congestion, sore throat Cardiovascular: No chest pain, palpitations Respiratory: No shortness of breath at rest or with exertion.   Some wheezes, once on asthma med's Gastrointestinal: No nausea, vomiting, diarrhea, abdominal pain, fecal incontinence. Genitourinary: see above Musculoskeletal: No neck pain, back pain Integumentary: No rash, pruritus, skin lesions Neurological: as above Psychiatric: as above Endocrine: No palpitations, diaphoresis, change in appetite, change in weigh or increased thirst.  Often feels cold Hematologic/Lymphatic: No anemia, purpura, petechiae. Allergic/Immunologic: No itchy/runny eyes, nasal congestion, recent allergic reactions, rashes  ALLERGIES: Allergies  Allergen Reactions  . Percocet [Oxycodone-Acetaminophen] Nausea And Vomiting    HOME MEDICATIONS:  Current outpatient prescriptions:  .  amitriptyline (ELAVIL) 25 MG tablet, One or two at bedtime, Disp: 60 tablet, Rfl: 5 .  FLUoxetine (PROZAC) 20 MG capsule, Take 1 capsule (20 mg total) by mouth daily., Disp: 30 capsule, Rfl: 11 .  HYDROcodone-acetaminophen (NORCO/VICODIN) 5-325 MG per tablet, Take 1 tablet by mouth daily as needed., Disp: 30 tablet, Rfl: 0 .  Interferon Beta-1a 44 MCG/0.5ML SOAJ, Inject 44 mcg into the skin., Disp: , Rfl:  .  modafinil (PROVIGIL) 200 MG tablet, Take 1 tablet in the morning and 1/2 tablet at noon., Disp: 45 tablet, Rfl: 5 .  rOPINIRole (REQUIP) 0.5 MG tablet, One or two at bedtime, Disp: 60 tablet, Rfl: 11 .  valsartan (DIOVAN) 80 MG tablet, Take 1 tablet (80 mg total) by mouth daily., Disp: 30 tablet, Rfl: 11  PAST MEDICAL HISTORY: Past Medical History  Diagnosis Date  . Multiple sclerosis   . Vision abnormalities   .  Hypertension   . Headache   . Hearing loss     PAST SURGICAL HISTORY: Past Surgical History  Procedure Laterality Date  . Carpal tunnel release Bilateral   . Ulnar nerve repair Bilateral   . Bariatric sleeve    . Ankle fracture surgery Bilateral     FAMILY HISTORY: Family History  Problem Relation Age of Onset  . Cancer Mother   . Stroke Mother   . Cancer Father     SOCIAL HISTORY:  History   Social History  . Marital Status: Married    Spouse Name: N/A  . Number of Children: N/A  . Years of Education: N/A   Occupational History  . Not on file.   Social History Main Topics  . Smoking status: Former  Smoker  . Smokeless tobacco: Not on file  . Alcohol Use: Not on file     Comment: Sts. she drinks beer, liquor daily, varying amts/fim  . Drug Use: No  . Sexual Activity: No   Other Topics Concern  . Not on file   Social History Narrative     PHYSICAL EXAM  Filed Vitals:   03/21/15 1053  BP: 128/91  Pulse: 86  Temp: 97.8 F (36.6 C)  TempSrc: Oral  Height:  (1.651 m)  Weight: 172 lb 6.4 oz (78.2 kg)    Body mass index is 28.69 kg/(m^2).   General: The patient is well-developed and well-nourished and in no acute distress  Neck/Head:  The neck is non tender.  Tympanic membranes intact   Musculoskeletal:   Mild lumbar tenderness.  Neurologic Exam  Mental status: The patient is alert and oriented x 3 at the time of the examination. The patient has apparent normal recent and remote memory, with an apparently normal attention span and concentration ability.   Speech is normal.  Cranial nerves: Extraocular movements are full.  . There is reduced right facial sensation to soft touch.Facial strength is normal.  Trapezius and sternocleidomastoid strength is normal. No dysarthria is noted.    She has reduced hearing on the right and Weber lateralizes right.  .  Motor:  Muscle bulk is normal.   Tone is normal. Strength is  5 / 5 in all 4 extremities.     Sensory:  In arms, she has decreased vibration sensation on the right.  In legs, mild decreased sensation to touch in a peroneal or L5 distribution on the right.      Coordination: Cerebellar testing reveals good finger-nose-finger  bilaterally.  Gait and station: Station is normal.   Gait is minimally wide. Tandem gait is mildly wide.   Reflexes: Deep tendon reflexes are symmetric and normal bilaterally.       DIAGNOSTIC DATA (LABS, IMAGING, TESTING) - I reviewed patient records, labs, notes, testing and imaging myself where available.     ASSESSMENT AND PLAN  Multiple sclerosis  Lumbar radicular pain  Depression with anxiety  Dysesthesia  Restless leg  Urinary urgency  Other fatigue  Cognitive changes  Ataxic gait    1.    Add buproprion for mood.   Referral to Behavioral Health - Dr. Andee Poles 2.    Continue Rebif.   If mood worsens further, consider another treatment.  We will need to get an MRI to assess for subclinical progression later in year.    3.    MRI lumbar spine for radiculopathy 4.   Continue modafinil for fatigue/sleepiness and ropinirole for RLS (prn) She will return to see me in 3-4 months or sooner if she has new or worsening neurologic symptoms.   Koty Anctil A. Epimenio Foot, MD, PhD 03/21/2015, 11:11 AM Certified in Neurology, Clinical Neurophysiology, Sleep Medicine, Pain Medicine and Neuroimaging  [facility] [address] [place], [place] [address] [phone]   .

## 2015-03-22 ENCOUNTER — Telehealth: Payer: Self-pay

## 2015-03-22 NOTE — Telephone Encounter (Signed)
Left message for patient. (Per last office visit with Dr. Epimenio Foot, patient wanted a psychiatry referral to Dr. Loralie Champagne office.) I stated on vm that their office requires her to call them and make the appointment. I left our call back number just incase she has any further questions.   I called Dr. Loralie Champagne office and they state that a referral is not required, they just require that patient calls them to make appt. I will send them some records.

## 2015-04-01 DIAGNOSIS — M5416 Radiculopathy, lumbar region: Secondary | ICD-10-CM | POA: Diagnosis not present

## 2015-04-04 ENCOUNTER — Telehealth: Payer: Self-pay | Admitting: *Deleted

## 2015-04-04 ENCOUNTER — Ambulatory Visit (INDEPENDENT_AMBULATORY_CARE_PROVIDER_SITE_OTHER): Payer: Self-pay

## 2015-04-04 DIAGNOSIS — Z0289 Encounter for other administrative examinations: Secondary | ICD-10-CM

## 2015-04-04 DIAGNOSIS — M5416 Radiculopathy, lumbar region: Secondary | ICD-10-CM

## 2015-04-04 NOTE — Telephone Encounter (Signed)
-----   Message from Asa Lente, MD sent at 04/04/2015  3:14 PM EDT ----- MRI of the lumbar spine shows degenerative changes that might put some pressure on the left L5 nerve root. If she is still having pain please call in a 6 day steroid pack and also etodolac 400 mg by mouth twice a day #60 #2  to take at the end of the steroids

## 2015-04-04 NOTE — Telephone Encounter (Signed)
LMTC./fim 

## 2015-04-05 MED ORDER — METHYLPREDNISOLONE 4 MG PO TBPK: ORAL_TABLET | ORAL | Status: DC

## 2015-04-05 NOTE — Telephone Encounter (Signed)
I have spoken with Catherine Hubbard this afternoon and advised that degen. changes noted on mri lumbar spine may be putting pressure on a nerve.  She sts. she still has lbp/spasms, some foot pain.  She is agreeable to medrol dose pk. and I have escribed this to [facility] in [place] per her request.  She also requests Modafinil be increased from 1.5 to 2 daily, and asks for a r/f of Hydrocodone.  I will check with RAS and let  her know tomorrow.  She is not able to take NSAIDS due to gi issues/fim

## 2015-04-05 NOTE — Telephone Encounter (Signed)
Pt returned your call.  

## 2015-04-06 MED ORDER — MODAFINIL 200 MG PO TABS: ORAL_TABLET | ORAL | Status: DC

## 2015-04-06 MED ORDER — HYDROCODONE-ACETAMINOPHEN 5-325 MG PO TABS: 1.0000 | ORAL_TABLET | Freq: Every day | ORAL | Status: DC | PRN

## 2015-04-06 NOTE — Telephone Encounter (Signed)
I spoke with Catherine Hubbard yesterday, and per RAS, advised that degen changes in lumbar spine may be putting pressure on a nerve--she sts. she still has intermittent foot pain, spasms.  She sts. she can't take NSAIDS due to gi issues (has had bariatric surgery and can't tolerate nsaids).  She is agreeable to trying a Medrol dose pk and I have [facility]rib[facility] [place] per her request.  She also requests r/f of Vicodin--per RAS, ok for #30.  She also requests increase in Modafinil from 1.5 daily to 2 daily, and per RAS, ok for this increase.  Vicodin and Modafinil rx's printed, up front for her to pick up/fim

## 2015-04-06 NOTE — Telephone Encounter (Signed)
-----   Message from Richard A Sater, MD sent at 04/04/2015  3:14 PM EDT ----- MRI of the lumbar spine shows degenerative changes that might put some pressure on the left L5 nerve root. If she is still having pain please call in a 6 day steroid pack and also etodolac 400 mg by mouth twice a day #60 #2  to take at the end of the steroids 

## 2015-04-24 ENCOUNTER — Encounter (HOSPITAL_COMMUNITY): Payer: Self-pay | Admitting: *Deleted

## 2015-04-24 DIAGNOSIS — Z87891 Personal history of nicotine dependence: Secondary | ICD-10-CM | POA: Insufficient documentation

## 2015-04-24 DIAGNOSIS — I10 Essential (primary) hypertension: Secondary | ICD-10-CM | POA: Insufficient documentation

## 2015-04-24 DIAGNOSIS — M545 Low back pain: Secondary | ICD-10-CM | POA: Diagnosis present

## 2015-04-24 DIAGNOSIS — M5416 Radiculopathy, lumbar region: Secondary | ICD-10-CM | POA: Insufficient documentation

## 2015-04-24 DIAGNOSIS — Z79899 Other long term (current) drug therapy: Secondary | ICD-10-CM | POA: Diagnosis not present

## 2015-04-24 DIAGNOSIS — H9190 Unspecified hearing loss, unspecified ear: Secondary | ICD-10-CM | POA: Diagnosis not present

## 2015-04-24 DIAGNOSIS — R42 Dizziness and giddiness: Secondary | ICD-10-CM | POA: Insufficient documentation

## 2015-04-24 DIAGNOSIS — R200 Anesthesia of skin: Secondary | ICD-10-CM | POA: Diagnosis not present

## 2015-04-24 LAB — I-STAT CHEM 8, ED
BUN: 16 mg/dL (ref 6–20)
CALCIUM ION: 1.15 mmol/L (ref 1.12–1.23)
Chloride: 99 mmol/L — ABNORMAL LOW (ref 101–111)
Creatinine, Ser: 0.8 mg/dL (ref 0.44–1.00)
Glucose, Bld: 90 mg/dL (ref 65–99)
HEMATOCRIT: 40 % (ref 36.0–46.0)
HEMOGLOBIN: 13.6 g/dL (ref 12.0–15.0)
Potassium: 3.4 mmol/L — ABNORMAL LOW (ref 3.5–5.1)
SODIUM: 137 mmol/L (ref 135–145)
TCO2: 23 mmol/L (ref 0–100)

## 2015-04-24 LAB — PROTIME-INR
INR: 1.19 (ref 0.00–1.49)
PROTHROMBIN TIME: 15.2 s (ref 11.6–15.2)

## 2015-04-24 LAB — APTT: aPTT: 27 seconds (ref 24–37)

## 2015-04-24 LAB — I-STAT TROPONIN, ED: TROPONIN I, POC: 0 ng/mL (ref 0.00–0.08)

## 2015-04-24 NOTE — ED Notes (Signed)
Per Suszanne Conners  Dr Ranae Palms wants the pt to get a stroke workup

## 2015-04-24 NOTE — ED Notes (Signed)
Patient states it feels like her whole body is "cramping" with burning and pain coming from her feet

## 2015-04-24 NOTE — ED Notes (Addendum)
Patient stated she was playing pool and went to take a shot started leaning to the left and her legs got weak  Stated it took 2 people to get her into the car.  C/o back spasms now.  Equal hand grips and equal feeling in face.  States she has trouble with her feet the left more than the right.

## 2015-04-25 ENCOUNTER — Encounter (HOSPITAL_COMMUNITY): Payer: Self-pay | Admitting: Radiology

## 2015-04-25 ENCOUNTER — Emergency Department (HOSPITAL_COMMUNITY): Payer: Federal, State, Local not specified - PPO

## 2015-04-25 ENCOUNTER — Emergency Department (HOSPITAL_COMMUNITY)
Admission: EM | Admit: 2015-04-25 | Discharge: 2015-04-25 | Disposition: A | Discharge status: Home / Self Care | Payer: Federal, State, Local not specified - PPO | Attending: Emergency Medicine | Admitting: Emergency Medicine

## 2015-04-25 DIAGNOSIS — M5416 Radiculopathy, lumbar region: Secondary | ICD-10-CM

## 2015-04-25 LAB — CBC
HCT: 37.6 % (ref 36.0–46.0)
Hemoglobin: 12.8 g/dL (ref 12.0–15.0)
MCH: 32.1 pg (ref 26.0–34.0)
MCHC: 34 g/dL (ref 30.0–36.0)
MCV: 94.2 fL (ref 78.0–100.0)
Platelets: 255 10*3/uL (ref 150–400)
RBC: 3.99 MIL/uL (ref 3.87–5.11)
RDW: 12.3 % (ref 11.5–15.5)
WBC: 7.8 10*3/uL (ref 4.0–10.5)

## 2015-04-25 LAB — DIFFERENTIAL
BASOS ABS: 0 10*3/uL (ref 0.0–0.1)
BASOS PCT: 0 % (ref 0–1)
EOS ABS: 0.1 10*3/uL (ref 0.0–0.7)
Eosinophils Relative: 1 % (ref 0–5)
Lymphocytes Relative: 33 % (ref 12–46)
Lymphs Abs: 2.6 10*3/uL (ref 0.7–4.0)
MONO ABS: 0.7 10*3/uL (ref 0.1–1.0)
Monocytes Relative: 9 % (ref 3–12)
Neutro Abs: 4.5 10*3/uL (ref 1.7–7.7)
Neutrophils Relative %: 57 % (ref 43–77)

## 2015-04-25 LAB — COMPREHENSIVE METABOLIC PANEL
ALT: 24 U/L (ref 14–54)
AST: 35 U/L (ref 15–41)
Albumin: 4.1 g/dL (ref 3.5–5.0)
Alkaline Phosphatase: 85 U/L (ref 38–126)
Anion gap: 11 (ref 5–15)
BUN: 14 mg/dL (ref 6–20)
CHLORIDE: 99 mmol/L — AB (ref 101–111)
CO2: 25 mmol/L (ref 22–32)
Calcium: 9.3 mg/dL (ref 8.9–10.3)
Creatinine, Ser: 0.55 mg/dL (ref 0.44–1.00)
Glucose, Bld: 96 mg/dL (ref 65–99)
POTASSIUM: 3.5 mmol/L (ref 3.5–5.1)
Sodium: 135 mmol/L (ref 135–145)
Total Bilirubin: 0.3 mg/dL (ref 0.3–1.2)
Total Protein: 7.2 g/dL (ref 6.5–8.1)

## 2015-04-25 LAB — CBG MONITORING, ED: GLUCOSE-CAPILLARY: 76 mg/dL (ref 65–99)

## 2015-04-25 MED ORDER — HYDROMORPHONE HCL 1 MG/ML IJ SOLN
1.0000 mg | Freq: Once | INTRAMUSCULAR | Status: AC
  Administered 2015-04-25: 1 mg via INTRAMUSCULAR
  Filled 2015-04-25: qty 1

## 2015-04-25 MED ORDER — HYDROCODONE-ACETAMINOPHEN 5-325 MG PO TABS: 1.0000 | ORAL_TABLET | ORAL | Status: DC | PRN

## 2015-04-25 MED ORDER — HYDROMORPHONE HCL 1 MG/ML IJ SOLN: 1.0000 mg | Freq: Once | INTRAMUSCULAR | Status: DC

## 2015-04-25 NOTE — ED Notes (Signed)
Ambulated pt in room. Pt took small steps but pt did well walking on her own. Pt stated that the Dilaudid helped her pain

## 2015-04-25 NOTE — Discharge Instructions (Signed)

## 2015-04-25 NOTE — ED Provider Notes (Signed)
CSN: [id number]   Arrival date & time 04/24/15 2236  History  This chart was scribed for Loren Racer, MD by Bethel Born, ED Scribe. This patient was seen in room A02C/A02C and the patient's care was started at 1:37 AM.  Chief Complaint  Patient presents with  . Painful to Stand     HPI The history is provided by the patient. No language interpreter was used.   Catherine Hubbard is a 54 y.o. female with PMHx of multiple sclerosis who presents to the Emergency Department complaining of an episode of light-headedness with sudden onset last night while shooting pool. The sensation has resolved. Pt states that she went to take a shot when she suddenly became dizzy before her legs got weak and she bent to the left side. Associated symptoms include diffuse right sided pain that is elicited with leaning left. She has chronic back and right foot pain. Pt denies change in vision and abdominal pain. Her last MRI was at [facility] last month that showed severe degenerative L5 disease.   Past Medical History  Diagnosis Date  . Multiple sclerosis   . Vision abnormalities   . Hypertension   . Headache   . Hearing loss     Past Surgical History  Procedure Laterality Date  . Carpal tunnel release Bilateral   . Ulnar nerve repair Bilateral   . Bariatric sleeve    . Ankle fracture surgery Bilateral     Family History  Problem Relation Age of Onset  . Cancer Mother   . Stroke Mother   . Cancer Father     Social History  Substance Use Topics  . Smoking status: Former Games developer  . Smokeless tobacco: Never Used  . Alcohol Use: 0.0 oz/week    0 Standard drinks or equivalent per week     Comment: Sts. she drinks beer, liquor daily, varying amts/fim     Review of Systems  Constitutional: Negative for fever and chills.  Eyes: Negative for visual disturbance.  Respiratory: Negative for shortness of breath.   Cardiovascular: Negative for chest pain.  Gastrointestinal: Positive for  abdominal pain. Negative for nausea, vomiting, diarrhea and constipation.  Genitourinary: Negative for dysuria, frequency and flank pain.  Musculoskeletal: Positive for back pain. Negative for neck pain and neck stiffness.  Skin: Negative for rash and wound.  Neurological: Positive for dizziness, light-headedness and numbness. Negative for speech difficulty, weakness and headaches.  All other systems reviewed and are negative.   Home Medications   Prior to Admission medications   Medication Sig Start Date End Date Taking? Authorizing Provider  amitriptyline (ELAVIL) 25 MG tablet One or two at bedtime 01/11/15   Asa Lente, MD  buPROPion (WELLBUTRIN XL) 300 MG 24 hr tablet Take 1 tablet (300 mg total) by mouth daily. 03/21/15   Asa Lente, MD  FLUoxetine (PROZAC) 20 MG capsule Take 1 capsule (20 mg total) by mouth daily. 10/06/14   Asa Lente, MD  HYDROcodone-acetaminophen (NORCO/VICODIN) 5-325 MG per tablet Take 1-2 tablets by mouth every 4 (four) hours as needed for severe pain. 04/25/15   Loren Racer, MD  Interferon Beta-1a 44 MCG/0.5ML SOAJ Inject 44 mcg into the skin.    Historical Provider, MD  methylPREDNISolone (MEDROL DOSEPAK) 4 MG TBPK tablet Take as directed 04/05/15   Asa Lente, MD  modafinil (PROVIGIL) 200 MG tablet Take 1 tablet in the morning and 1 tablet at noon. 04/06/15   Asa Lente, MD  rOPINIRole (REQUIP)  0.5 MG tablet One or two at bedtime 10/06/14   Asa Lente, MD  valsartan (DIOVAN) 80 MG tablet Take 1 tablet (80 mg total) by mouth daily. 10/06/14   Asa Lente, MD    Allergies  Percocet  Triage Vitals: BP 104/73 mmHg  Pulse 105  Temp(Src) 98 F (36.7 C) (Oral)  Resp 18  Ht 5\' 5"  (1.651 m)  Wt 170 lb (77.111 kg)  BMI 28.29 kg/m2  SpO2 96%  Physical Exam  Constitutional: She is oriented to person, place, and time. She appears well-developed and well-nourished. No distress.  HENT:  Head: Normocephalic and atraumatic.   Mouth/Throat: Oropharynx is clear and moist.  Eyes: EOM are normal. Pupils are equal, round, and reactive to light.  Neck: Normal range of motion. Neck supple.  Cardiovascular: Normal rate and regular rhythm.  Exam reveals no gallop and no friction rub.   No murmur heard. Pulmonary/Chest: Effort normal and breath sounds normal. No respiratory distress. She has no wheezes. She has no rales.  Abdominal: Soft. Bowel sounds are normal. She exhibits no distension and no mass. There is no tenderness. There is no rebound and no guarding.  Musculoskeletal: Normal range of motion. She exhibits no edema or tenderness.  No lower extremity swelling or pain. Distal pulses intact.  Neurological: She is alert and oriented to person, place, and time.  Patient is alert and oriented x3 with clear, goal oriented speech. Patient has 5/5 motor in all extremities. Sensation is intact to light touch. Bilateral finger-to-nose is normal with no signs of dysmetria.   Skin: Skin is warm and dry. No rash noted. No erythema.  Psychiatric: She has a normal mood and affect. Her behavior is normal.  Nursing note and vitals reviewed.   ED Course  Procedures   DIAGNOSTIC STUDIES: Oxygen Saturation is 96% on RA, normal by my interpretation.    COORDINATION OF CARE: 1:41 AM Discussed treatment plan which includes lab work, CT head without contrast, and EKG  with pt at bedside and pt agreed to plan.  Labs Reviewed  COMPREHENSIVE METABOLIC PANEL - Abnormal; Notable for the following:    Chloride 99 (*)    All other components within normal limits  I-STAT CHEM 8, ED - Abnormal; Notable for the following:    Potassium 3.4 (*)    Chloride 99 (*)    All other components within normal limits  PROTIME-INR  APTT  CBC  DIFFERENTIAL  I-STAT TROPOININ, ED  CBG MONITORING, ED    I, Loren Racer, MD, personally reviewed and evaluated these images and lab results as part of my medical decision-making.  Imaging Review  Ct Head Wo Contrast  04/25/2015   CLINICAL DATA:  Stroke symptoms. Sudden onset leaning to the left and leg weakness. Back spasms.  EXAM: CT HEAD WITHOUT CONTRAST  TECHNIQUE: Contiguous axial images were obtained from the base of the skull through the vertex without intravenous contrast.  COMPARISON:  None.  FINDINGS: Mild diffuse cerebral atrophy. Mild ventricular dilatation consistent with central atrophy. Patchy low-attenuation changes throughout the deep white matter bilaterally likely due to small vessel ischemia. No mass effect or midline shift. No abnormal extra-axial fluid collections. Gray-white matter junctions are distinct. Basal cisterns are not effaced. No evidence of acute intracranial hemorrhage. No depressed skull fractures. Visualized paranasal sinuses and mastoid air cells are not opacified.  IMPRESSION: No acute intracranial abnormalities. Chronic atrophy and small vessel ischemic changes.   Electronically Signed   By: Marisa Cyphers.D.  On: 04/25/2015 02:38    EKG Interpretation  Date/Time:    Ventricular Rate:    PR Interval:    QRS Duration:   QT Interval:    QTC Calculation:   R Axis:     Text Interpretation:         MDM   Final diagnoses:  Lumbar radicular pain     I personally performed the services described in this documentation, which was scribed in my presence. The recorded information has been reviewed and is accurate.   Patient's with improved pain after IM Dilaudid. Ambulatory in the emergency department. Reviewed recent MRI which showed severe left impingement of L5. Patient referred to spinal surgeon. Return precautions given.  Loren Racer, MD 04/25/15 [phone]

## 2015-04-25 NOTE — ED Notes (Signed)
Attempted to ambulate pt, unable to walk due to weakness and pain.

## 2015-04-25 NOTE — ED Notes (Signed)
Patient transported to CT 

## 2015-05-16 ENCOUNTER — Ambulatory Visit: Payer: Federal, State, Local not specified - PPO | Admitting: Neurology

## 2015-05-26 ENCOUNTER — Ambulatory Visit (INDEPENDENT_AMBULATORY_CARE_PROVIDER_SITE_OTHER): Payer: Federal, State, Local not specified - PPO | Admitting: Neurology

## 2015-05-26 ENCOUNTER — Encounter: Payer: Self-pay | Admitting: Neurology

## 2015-05-26 VITALS — BP 122/88 | HR 86 | Resp 14 | Ht 65.0 in | Wt 172.8 lb

## 2015-05-26 DIAGNOSIS — G2581 Restless legs syndrome: Secondary | ICD-10-CM | POA: Diagnosis not present

## 2015-05-26 DIAGNOSIS — R4189 Other symptoms and signs involving cognitive functions and awareness: Secondary | ICD-10-CM | POA: Diagnosis not present

## 2015-05-26 DIAGNOSIS — R3915 Urgency of urination: Secondary | ICD-10-CM

## 2015-05-26 DIAGNOSIS — R208 Other disturbances of skin sensation: Secondary | ICD-10-CM

## 2015-05-26 DIAGNOSIS — F418 Other specified anxiety disorders: Secondary | ICD-10-CM

## 2015-05-26 DIAGNOSIS — G35 Multiple sclerosis: Secondary | ICD-10-CM | POA: Diagnosis not present

## 2015-05-26 DIAGNOSIS — R5383 Other fatigue: Secondary | ICD-10-CM

## 2015-05-26 DIAGNOSIS — M5416 Radiculopathy, lumbar region: Secondary | ICD-10-CM

## 2015-05-26 DIAGNOSIS — G4700 Insomnia, unspecified: Secondary | ICD-10-CM

## 2015-05-26 DIAGNOSIS — R260 Ataxic gait: Secondary | ICD-10-CM | POA: Diagnosis not present

## 2015-05-26 DIAGNOSIS — Z79899 Other long term (current) drug therapy: Secondary | ICD-10-CM | POA: Diagnosis not present

## 2015-05-26 MED ORDER — BACLOFEN 10 MG PO TABS: ORAL_TABLET | ORAL | Status: DC

## 2015-05-26 NOTE — Progress Notes (Signed)
[facility]  PATIENT: Catherine Hubbard DOB: 11-04-1960  REFERRING DOCTOR OR PCP:  No PCP, transferring care SOURCE: Patient  _________________________________   HISTORICAL  CHIEF COMPLAINT:  Chief Complaint  Patient presents with  . Multiple Sclerosis    Sts. Catherine Hubbard continues to tolerate Rebif well.  Thinks fatigue is a little better since Modafinil was increased at last ov.  Also sts. depression is some better since starting Wellbutrin.  Catherine Hubbard has not f/u with behavioral health.  Sts. lbp, left foot is about the same--intermittent depending on activity.  Sts. Medrol dose pk. did not help./fim  . Depression  . Back Pain    HISTORY OF PRESENT ILLNESS:  Catherine Hubbard is a 54 year old woman with MS.   At the last visit Wellbutrin was added and Catherine Hubbard feels mood is better.    Catherine Hubbard canceled [facility] appointment.     Catherine Hubbard is on Rebif for the MS and notes more pain in Catherine Hubbard mid back after Catherine Hubbard shots.      LBP and leg pain:    Pain is worse in left foot but has occurred on right as well lately.  Pain started earlier this year.     Amitriptyline only helped slightly and Catherine Hubbard had strange dreams with it so Catherine Hubbard stopped.     Catherine Hubbard has LBP but this is better this month than at the last visit.     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    Catherine Hubbard has had right hip pain, as well.    This pain worsened after using the '[employer]' > one hor recently.   Gait/strength/sensation:   Gait is stable with stumbling at times but no falls.   Catherine Hubbard is clumsy and has mild right foot drop   Sometimes Catherine Hubbard feels like Catherine Hubbard is being pushed to the left or the right as Catherine Hubbard walks.   Catherine Hubbard notes some minimal generalized weakness in Catherine Hubbard arms.  Catherine Hubbard has tingling in Catherine Hubbard hands and face.    Bladder:   Catherine Hubbard has mild urinary frequency and no recent incontinence.  Often, after Catherine Hubbard urinates Catherine Hubbard feels Catherine Hubbard  has to urinate again --  bBut usually only in the morning.  Catherine Hubbard has occasional nocturia.  Vision:   Catherine Hubbard feels this is stable.    When hse lays on Catherine Hubbard side watching TV Catherine Hubbard sometimes has mild diplopia and/or blurry vision.    Mood:   Catherine Hubbard reports imprpoved depression and anxiety.    Apathy is only slightly better.    Catherine Hubbard gets some benefit from Wellbutrin with  Prozac.   Catherine Hubbard drinks alcohol (6 beers or more) drinks a day but some nights does not drink.     Cognition:   Catherine Hubbard reports mild difficulty with cognition, better since mood improved. Specifically Catherine Hubbard has some difficulty with short-term memory and verbal fluency.  Catherine Hubbard does not always complete tasks that Catherine Hubbard starts.  Fatigue/sleep:   Catherine Hubbard has fatigue but feels better with cooler weather and since starting Wellbutrin.   . This appears to be both cognitive and physical. Provigil helps some also.  Catherine Hubbard was tried on Ritalin or Adderall once but Catherine Hubbard did not like the way that Catherine Hubbard felt when Catherine Hubbard took it.  Catherine Hubbard had insomnia.   Catherine Hubbard notes difficulty falling asleep more than with sleep maintenance.    Catherine Hubbard notes that Catherine Hubbard has restless  legs. This will keep Catherine Hubbard up many nights. Gabapentin was not well tolerated and made Catherine Hubbard irritable. Ropinirole helps RLS some.    Baclofen helped some in the past.  MS History:   Catherine Hubbard was diagnosed with multiple sclerosis in 2009. Before that time, Catherine Hubbard had noted some gait ataxia but had not had any imaging studies. In 2009, due to progressive hearing loss, Catherine Hubbard had an MRI of the brain and  then had a lumbar puncture that also confirmed the diagnosis.  I have some of Catherine Hubbard early MRI reports there are several foci noted in the cervical and thoracic spinal cord in the brain, there were multiple hyperintense foci, mostly in the periventricular deep white matter many of these were contrast enhancing on 07/26/2008. There was another enhancing lesion adjacent to C2 in the spinal cord.  Initially, Catherine Hubbard was placed on Avonex. Catherine Hubbard did not feel good and Catherine Hubbard  stopped. Catherine Hubbard then tried Copaxone but also stopped after a while. Catherine Hubbard moved here in 2014 and saw Dr. Renne Crigler at [facility]. Catherine Hubbard was placed on Rebif. Catherine Hubbard tolerates Rebif well.      MRI reports from 12/04/2012  showed multiple foci in the spinal cord at C2, C3-C4, and C4-C5. There were also foci at T4, T7, T9 and T10. The MRI of the brain showed 2 nonenhancing foci not present from studies in 2011. Catherine Hubbard reports having an MRI at [facility] 2015 but we do not have those films.   REVIEW OF SYSTEMS: Constitutional: No fevers, chills, sweats, or change in appetite.    Catherine Hubbard reports fatigue and insomnia Eyes: see above.  No eye pain Ear, nose and throat: No hearing loss, ear pain, nasal congestion, sore throat Cardiovascular: No chest pain, palpitations Respiratory: No shortness of breath at rest or with exertion.   Some wheezes, once on asthma med's Gastrointestinal: No nausea, vomiting, diarrhea, abdominal pain, fecal incontinence. Genitourinary: see above Musculoskeletal: No neck pain, back pain Integumentary: No rash, pruritus, skin lesions Neurological: as above Psychiatric: as above Endocrine: No palpitations, diaphoresis, change in appetite, change in weigh or increased thirst.  Often feels cold Hematologic/Lymphatic: No anemia, purpura, petechiae. Allergic/Immunologic: No itchy/runny eyes, nasal congestion, recent allergic reactions, rashes  ALLERGIES: Allergies  Allergen Reactions  . Percocet [Oxycodone-Acetaminophen] Nausea And Vomiting    HOME MEDICATIONS:  Current outpatient prescriptions:  .  buPROPion (WELLBUTRIN XL) 300 MG 24 hr tablet, Take 1 tablet (300 mg total) by mouth daily., Disp: 30 tablet, Rfl: 5 .  FLUoxetine (PROZAC) 20 MG capsule, Take 1 capsule (20 mg total) by mouth daily., Disp: 30 capsule, Rfl: 11 .  HYDROcodone-acetaminophen (NORCO/VICODIN) 5-325 MG per tablet, Take 1-2 tablets by mouth every 4 (four) hours as needed for severe pain., Disp: 15 tablet, Rfl: 0 .   Interferon Beta-1a 44 MCG/0.5ML SOAJ, Inject 44 mcg into the skin., Disp: , Rfl:  .  modafinil (PROVIGIL) 200 MG tablet, Take 1 tablet in the morning and 1 tablet at noon., Disp: 60 tablet, Rfl: 5 .  valsartan (DIOVAN) 80 MG tablet, Take 1 tablet (80 mg total) by mouth daily., Disp: 30 tablet, Rfl: 11 .  amitriptyline (ELAVIL) 25 MG tablet, One or two at bedtime (Patient not taking: Reported on 05/26/2015), Disp: 60 tablet, Rfl: 5 .  methylPREDNISolone (MEDROL DOSEPAK) 4 MG TBPK tablet, Take as directed (Patient not taking: Reported on 05/26/2015), Disp: 21 tablet, Rfl: 0 .  rOPINIRole (REQUIP) 0.5 MG tablet, One or two at bedtime (Patient not taking: Reported on 05/26/2015), Disp:  60 tablet, Rfl: 11  PAST MEDICAL HISTORY: Past Medical History  Diagnosis Date  . Multiple sclerosis (HCC)   . Vision abnormalities   . Hypertension   . Headache   . Hearing loss     PAST SURGICAL HISTORY: Past Surgical History  Procedure Laterality Date  . Carpal tunnel release Bilateral   . Ulnar nerve repair Bilateral   . Bariatric sleeve    . Ankle fracture surgery Bilateral     FAMILY HISTORY: Family History  Problem Relation Age of Onset  . Cancer Mother   . Stroke Mother   . Cancer Father     SOCIAL HISTORY:  Social History   Social History  . Marital Status: Married    Spouse Name: N/A  . Number of Children: N/A  . Years of Education: N/A   Occupational History  . Not on file.   Social History Main Topics  . Smoking status: Former Games developer  . Smokeless tobacco: Never Used  . Alcohol Use: 0.0 oz/week    0 Standard drinks or equivalent per week     Comment: Sts. Catherine Hubbard drinks beer, liquor daily, varying amts/fim  . Drug Use: No  . Sexual Activity: No   Other Topics Concern  . Not on file   Social History Narrative     PHYSICAL EXAM  Filed Vitals:   05/26/15 1059  BP: 122/88  Pulse: 86  Resp: 14  Height:  (1.651 m)  Weight: 172 lb 12.8 oz (78.382 kg)    Body mass  index is 28.76 kg/(m^2).   General: The patient is well-developed and well-nourished and in no acute distress  Neck/Head:  The neck is non tender.     Musculoskeletal:   Mild lumbar tenderness.    Moderate right trochanteric tenderness  Neurologic Exam  Mental status: The patient is alert and oriented x 3 at the time of the examination. The patient has apparent normal recent and remote memory, with an apparently normal attention span and concentration ability.   Speech is normal.  Cranial nerves: Extraocular movements are full.  . There is reduced right facial sensation to soft touch. Facial strength is normal.  Trapezius and sternocleidomastoid strength is normal. No dysarthria is noted.    Catherine Hubbard has reduced hearing on the right and Weber lateralizes right.  .  Motor:  Muscle bulk is normal.   Tone is normal. Strength is  5 / 5 in all 4 extremities.   Sensory:  In arms, Catherine Hubbard has decreased vibration sensation on the right.  In legs, mild decreased sensation to touch in right L5 distribution    Coordination: Cerebellar testing reveals good finger-nose-finger  bilaterally.  Gait and station: Station is normal.   Gait is minimally wide. Tandem gait is mildly wide.   Reflexes: Deep tendon reflexes are symmetric and normal bilaterally.       DIAGNOSTIC DATA (LABS, IMAGING, TESTING) - I reviewed patient records, labs, notes, testing and imaging myself where available.     ASSESSMENT AND PLAN  Multiple sclerosis (HCC)  Ataxic gait  Cognitive changes  Depression with anxiety  Dysesthesia  High risk medication use  Insomnia  Lumbar radicular pain  Other fatigue  Restless leg  Urinary urgency   1.   Continue Rebif. I will check T's and CBC to make sure that there is no hepatotoxicity or lymphopenia. 2.   Catherine Hubbard will continue Provigil for Catherine Hubbard fatigue. 3.   Continue fluoxetine for mood issues.  Catherine Hubbard will return to  see me in 5-6 months or sooner if Catherine Hubbard has new or worsening  neurologic symptoms.   Savonna Birchmeier A. Epimenio Foot, MD, PhD 05/26/2015, 11:12 AM Certified in Neurology, Clinical Neurophysiology, Sleep Medicine, Pain Medicine and Neuroimaging  [facility] [address], [address] [place], [place] [address] [phone]   .

## 2015-05-27 ENCOUNTER — Telehealth: Payer: Self-pay | Admitting: *Deleted

## 2015-05-27 LAB — HEPATIC FUNCTION PANEL
ALBUMIN: 4.2 g/dL (ref 3.5–5.5)
ALK PHOS: 84 IU/L (ref 39–117)
ALT: 23 IU/L (ref 0–32)
AST: 45 IU/L — AB (ref 0–40)
BILIRUBIN TOTAL: 0.4 mg/dL (ref 0.0–1.2)
BILIRUBIN, DIRECT: 0.13 mg/dL (ref 0.00–0.40)
TOTAL PROTEIN: 6.8 g/dL (ref 6.0–8.5)

## 2015-05-27 LAB — CBC WITH DIFFERENTIAL/PLATELET
Basophils Absolute: 0 10*3/uL (ref 0.0–0.2)
Basos: 1 %
EOS (ABSOLUTE): 0.2 10*3/uL (ref 0.0–0.4)
EOS: 3 %
HEMATOCRIT: 38 % (ref 34.0–46.6)
HEMOGLOBIN: 12.6 g/dL (ref 11.1–15.9)
IMMATURE GRANS (ABS): 0 10*3/uL (ref 0.0–0.1)
Immature Granulocytes: 0 %
LYMPHS ABS: 1.6 10*3/uL (ref 0.7–3.1)
LYMPHS: 29 %
MCH: 31.7 pg (ref 26.6–33.0)
MCHC: 33.2 g/dL (ref 31.5–35.7)
MCV: 96 fL (ref 79–97)
MONOCYTES: 14 %
Monocytes Absolute: 0.8 10*3/uL (ref 0.1–0.9)
Neutrophils Absolute: 3 10*3/uL (ref 1.4–7.0)
Neutrophils: 53 %
Platelets: 278 10*3/uL (ref 150–379)
RBC: 3.98 x10E6/uL (ref 3.77–5.28)
RDW: 13.7 % (ref 12.3–15.4)
WBC: 5.6 10*3/uL (ref 3.4–10.8)

## 2015-05-27 NOTE — Telephone Encounter (Signed)
LMTC./fim 

## 2015-05-27 NOTE — Telephone Encounter (Signed)
-----   Message from Asa Lente, MD sent at 05/27/2015  8:22 AM EDT ----- Please let her know that the labs look okay

## 2015-06-06 ENCOUNTER — Telehealth: Payer: Self-pay | Admitting: *Deleted

## 2015-06-06 NOTE — Telephone Encounter (Signed)
I have spoken with Catherine Hubbard this afternoon and per RAS, advised that labwork was ok.  She verbalized understanding of same/fim

## 2015-06-06 NOTE — Telephone Encounter (Signed)
-----   Message from Richard A Sater, MD sent at 05/27/2015  8:22 AM EDT ----- Please let her know that the labs look okay 

## 2015-06-06 NOTE — Telephone Encounter (Signed)
Ok to refill #30

## 2015-06-07 MED ORDER — HYDROCODONE-ACETAMINOPHEN 5-325 MG PO TABS: 1.0000 | ORAL_TABLET | ORAL | Status: DC | PRN

## 2015-06-07 NOTE — Addendum Note (Signed)
Addended by: Candis Schatz I on: 06/07/2015 08:06 AM   Modules accepted: Orders

## 2015-06-07 NOTE — Telephone Encounter (Signed)
LMOM that RAS will r/f Hydrocodone, and rx. will be available to be picked up at [facility] today.  Rx. printed, signed, up front [facility]/fim

## 2015-06-24 ENCOUNTER — Other Ambulatory Visit: Payer: Self-pay | Admitting: Neurology

## 2015-07-07 ENCOUNTER — Telehealth: Payer: Self-pay | Admitting: Neurology

## 2015-07-07 NOTE — Telephone Encounter (Signed)
I have spoken with Catherine Hubbard who c/o worsening neck and lbp.  Sts. saw a chiropractor last week, sts. no relief with his treatment and would like to see RAS; discuss tx. options.  Appt. given for 07-13-15/fim

## 2015-07-07 NOTE — Telephone Encounter (Signed)
Patient called to advise she had a really bad episode last week and thinks it's time to go back on epidurals. Would like to talk to [facility] about this.

## 2015-07-13 ENCOUNTER — Encounter: Payer: Self-pay | Admitting: Neurology

## 2015-07-13 ENCOUNTER — Other Ambulatory Visit: Payer: Self-pay | Admitting: Neurology

## 2015-07-13 ENCOUNTER — Ambulatory Visit (INDEPENDENT_AMBULATORY_CARE_PROVIDER_SITE_OTHER): Payer: Federal, State, Local not specified - PPO | Admitting: Neurology

## 2015-07-13 VITALS — BP 159/99 | HR 75 | Wt 173.4 lb

## 2015-07-13 DIAGNOSIS — R200 Anesthesia of skin: Secondary | ICD-10-CM | POA: Diagnosis not present

## 2015-07-13 DIAGNOSIS — M5441 Lumbago with sciatica, right side: Secondary | ICD-10-CM | POA: Diagnosis not present

## 2015-07-13 DIAGNOSIS — R4189 Other symptoms and signs involving cognitive functions and awareness: Secondary | ICD-10-CM

## 2015-07-13 DIAGNOSIS — R260 Ataxic gait: Secondary | ICD-10-CM | POA: Diagnosis not present

## 2015-07-13 DIAGNOSIS — G2581 Restless legs syndrome: Secondary | ICD-10-CM | POA: Diagnosis not present

## 2015-07-13 DIAGNOSIS — F418 Other specified anxiety disorders: Secondary | ICD-10-CM | POA: Diagnosis not present

## 2015-07-13 DIAGNOSIS — M5416 Radiculopathy, lumbar region: Secondary | ICD-10-CM

## 2015-07-13 DIAGNOSIS — M25551 Pain in right hip: Secondary | ICD-10-CM | POA: Insufficient documentation

## 2015-07-13 DIAGNOSIS — G35 Multiple sclerosis: Secondary | ICD-10-CM

## 2015-07-13 DIAGNOSIS — M5442 Lumbago with sciatica, left side: Secondary | ICD-10-CM

## 2015-07-13 DIAGNOSIS — G4700 Insomnia, unspecified: Secondary | ICD-10-CM

## 2015-07-13 DIAGNOSIS — R5383 Other fatigue: Secondary | ICD-10-CM

## 2015-07-13 MED ORDER — CYCLOBENZAPRINE HCL 5 MG PO TABS: 5.0000 mg | ORAL_TABLET | Freq: Three times a day (TID) | ORAL | Status: DC | PRN

## 2015-07-13 NOTE — Progress Notes (Signed)
Catherine Hubbard  PATIENT: Catherine Hubbard DOB: 03-10-1961   _________________________________   HISTORICAL  CHIEF COMPLAINT:  Chief Complaint  Patient presents with  . Multiple sclerosis    rm  . Follow-up    last OV 05/26/2015    HISTORY OF PRESENT ILLNESS:  Catherine Hubbard is a 54 year old woman with MS.     She feels the MS si mostly stable.    She is on Rebif and tolerates it well.     LBP and leg pain:  She woke up one day a couple weeks ago with a lot more pain in her back and down her legs (bilateraleft but worse on left).    Compared to 2 weeks ago, she feels pain is about the pain.    Leg pain is up to the knees bilaterally.     She also feels both legs are weak.  Pain is worse than the LB and peg pain she had when I saw her last.   Sitting makes it worse and shifting around makes the pain better.    She neds to also move more at night to get comfortable.     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      Gait/strength/sensation:   Gait seems slightly worsewith stumbling at times but no falls. She is limping due tto pain in her left foot.     She is clumsy and has mild right foot drop.   Right hand also seems clumsy.      Sometimes she feels like she is being pushed to the left or the right as she walks.   She has tingling in her hands and face.    Bladder:   She has mild urinary frequency and no recent incontinence.  She feels it has not changed as the LBP worsened.  She has occasional incomplete emptying.   She has occasional nocturia.  Vision:   She feels this is stable with rare mild diplopia and/or blurry vision.    Mood:   She reported improved depression and anxiety on Wellbutrin and Prozac but the last 2 weeks she feels more irritable and mildly depressed.  She has some apathy.   She drinks alcohol most days (3-4 beers or more) but some  nights does not drink.     Cognition:   She reports mild difficulty with cognition, better since mood improved. Specifically she has some difficulty with short-term memory and verbal fluency.  She does not always complete tasks that she starts.  Fatigue:   She has more fatigue. Provigil helps some but seemed to help more in past .  She was tried on Ritalin or Adderall once but she did not like the way that she felt when she took it.    Sleep:   She had insomnia.   She notes difficulty falling asleep more than with sleep maintenance.    She notes that she has restless legs. This will keep her up many nights. Gabapentin was not well tolerated and made her irritable. Ropinirole helps RLS some.    Baclofen helped some in the past.  MS History:   She was diagnosed with multiple sclerosis in 2009. Before that time, she had noted some gait ataxia but had not had any imaging studies. In 2009, due to progressive hearing loss, she had an MRI of the brain and  then  had a lumbar puncture that also confirmed the diagnosis.  I have some of her early MRI reports there are several foci noted in the cervical and thoracic spinal cord in the brain, there were multiple hyperintense foci, mostly in the periventricular deep white matter many of these were contrast enhancing on 07/26/2008. There was another enhancing lesion adjacent to C2 in the spinal cord.  Initially, she was placed on Avonex. She did not feel good and she stopped. She then tried Copaxone but also stopped after a while. She moved here in 2014 and saw Dr. Renne Crigler at [facility]. She was placed on Rebif. She tolerates Rebif well.      MRI reports from 12/04/2012  showed multiple foci in the spinal cord at C2, C3-C4, and C4-C5. There were also foci at T4, T7, T9 and T10. The MRI of the brain showed 2 nonenhancing foci not present from studies in 2011. She reports having an MRI at [facility] 2015 but we do not have those films.   REVIEW OF  SYSTEMS: Constitutional: No fevers, chills, sweats, or change in appetite.    She reports fatigue and insomnia Eyes: see above.  No eye pain  But some light sensitivity. Ear, nose and throat: No hearing loss, ear pain, nasal congestion, sore throat.   tinnitus Cardiovascular: No chest pain, palpitations Respiratory: No shortness of breath at rest or with exertion.   Some wheezes, once on asthma med's Gastrointestinal: No nausea, vomiting, diarrhea, abdominal pain, fecal incontinence. Genitourinary: see above Musculoskeletal: see above  She also notes neck pain Integumentary: No rash, pruritus, skin lesions Neurological: as above Psychiatric: as above Endocrine: No palpitations, diaphoresis, change in appetite, change in weigh or increased thirst.  Often feels cold Hematologic/Lymphatic: No anemia, purpura, petechiae. Allergic/Immunologic: No itchy/runny eyes, nasal congestion, recent allergic reactions, rashes  ALLERGIES: Allergies  Allergen Reactions  . Percocet [Oxycodone-Acetaminophen] Nausea And Vomiting    HOME MEDICATIONS:  Current outpatient prescriptions:  .  baclofen (LIORESAL) 10 MG tablet, TAKE UP TO 3 TABLETS BY MOUTH AT NIGHT, Disp: 90 tablet, Rfl: 3 .  buPROPion (WELLBUTRIN XL) 300 MG 24 hr tablet, Take 1 tablet (300 mg total) by mouth daily., Disp: 30 tablet, Rfl: 5 .  FLUoxetine (PROZAC) 20 MG capsule, Take 1 capsule (20 mg total) by mouth daily., Disp: 30 capsule, Rfl: 11 .  HYDROcodone-acetaminophen (NORCO/VICODIN) 5-325 MG tablet, Take 1-2 tablets by mouth every 4 (four) hours as needed for severe pain., Disp: 30 tablet, Rfl: 0 .  Interferon Beta-1a 44 MCG/0.5ML SOAJ, Inject 44 mcg into the skin., Disp: , Rfl:  .  modafinil (PROVIGIL) 200 MG tablet, Take 1 tablet in the morning and 1 tablet at noon., Disp: 60 tablet, Rfl: 5 .  rOPINIRole (REQUIP) 0.5 MG tablet, One or two at bedtime, Disp: 60 tablet, Rfl: 11 .  valsartan (DIOVAN) 80 MG tablet, Take 1 tablet (80  mg total) by mouth daily., Disp: 30 tablet, Rfl: 11 .  amitriptyline (ELAVIL) 25 MG tablet, One or two at bedtime (Patient not taking: Reported on 05/26/2015), Disp: 60 tablet, Rfl: 5  PAST MEDICAL HISTORY: Past Medical History  Diagnosis Date  . Multiple sclerosis (HCC)   . Vision abnormalities   . Hypertension   . Headache   . Hearing loss     PAST SURGICAL HISTORY: Past Surgical History  Procedure Laterality Date  . Carpal tunnel release Bilateral   . Ulnar nerve repair Bilateral   . Bariatric sleeve    . Ankle  fracture surgery Bilateral     FAMILY HISTORY: Family History  Problem Relation Age of Onset  . Cancer Mother   . Stroke Mother   . Cancer Father     SOCIAL HISTORY:  Social History   Social History  . Marital Status: Married    Spouse Name: N/A  . Number of Children: N/A  . Years of Education: N/A   Occupational History  . Not on file.   Social History Main Topics  . Smoking status: Former Games developer  . Smokeless tobacco: Never Used  . Alcohol Use: 0.0 oz/week    0 Standard drinks or equivalent per week     Comment: Sts. she drinks beer, liquor daily, varying amts/fim  . Drug Use: No  . Sexual Activity: No   Other Topics Concern  . Not on file   Social History Narrative     PHYSICAL EXAM  Filed Vitals:   07/13/15 0909  BP: 159/99  Pulse: 75  Weight: 173 lb 6.4 oz (78.654 kg)    Body mass index is 28.86 kg/(m^2).   General: The patient is well-developed and well-nourished and in no acute distress  Neck/Head:  The neck is non tender.  Good ROM     Musculoskeletal:   Mild lumbar tenderness.    Moderate right trochanteric tenderness.   Moderate bilateral piriformis tenderness  Neurologic Exam  Mental status: The patient is alert and oriented x 3 at the time of the examination. The patient has apparent normal recent and remote memory, with an apparently normal attention span and concentration ability.   Speech is normal.  Cranial  nerves: Extraocular movements are full.  . There is reduced right facial sensation to soft touch. Facial strength is normal.  Trapezius and sternocleidomastoid strength is normal. No dysarthria is noted.    She has reduced hearing on the right and Weber lateralizes right.  .  Motor:  Muscle bulk is normal.   Tone is normal. Strength is  5 / 5 in all 4 extremities.   Sensory:  In arms, sensation was symmetric.  In legs, mild decreased sensation to touch in left L5 distribution +/- on right   Coordination: Cerebellar testing reveals good finger-nose-finger  bilaterally.  Gait and station: Station is normal.   Gait is minimally wide and arthritic. Tandem gait is mildly wide.   Reflexes: Deep tendon reflexes are symmetric and normal bilaterally.       DIAGNOSTIC DATA (LABS, IMAGING, TESTING) - I reviewed patient records, labs, notes, testing and imaging myself where available.     ASSESSMENT AND PLAN  Multiple sclerosis (HCC)  Lumbar radicular pain  Ataxic gait  Depression with anxiety  Cognitive changes  Insomnia  Other fatigue  Numbness  Restless leg  Bilateral low back pain with bilateral sciatica, unspecified chronicity  Right hip pain   1.   Right trochanteric bursa injection with 40 mg Depo-Medrol in Marcaine using sterile technique. 2.   Bilateral piriformis trigger point injections with 40 mg Depo-Medrol in Marcaine using sterile technique. 3.   Flexeril up to tid 4.   She will continue other medications.   Increase ropinirole to 2 at night 3.   She will return to see me in 4 months or sooner if she has new or worsening neurologic symptoms.   Carlise Stofer A. Epimenio Foot, MD, PhD 07/13/2015, 9:18 AM Certified in Neurology, Clinical Neurophysiology, Sleep Medicine, Pain Medicine and Neuroimaging  [facility] [address], [address] [place], [place] [address] [phone]   .

## 2015-07-20 ENCOUNTER — Encounter (HOSPITAL_COMMUNITY): Payer: Self-pay | Admitting: Emergency Medicine

## 2015-07-20 ENCOUNTER — Emergency Department (HOSPITAL_COMMUNITY): Payer: Federal, State, Local not specified - PPO

## 2015-07-20 ENCOUNTER — Emergency Department (HOSPITAL_COMMUNITY)
Admission: EM | Admit: 2015-07-20 | Discharge: 2015-07-20 | Disposition: A | Discharge status: Home / Self Care | Payer: Federal, State, Local not specified - PPO | Attending: Emergency Medicine | Admitting: Emergency Medicine

## 2015-07-20 DIAGNOSIS — Y92512 Supermarket, store or market as the place of occurrence of the external cause: Secondary | ICD-10-CM | POA: Insufficient documentation

## 2015-07-20 DIAGNOSIS — S2239XA Fracture of one rib, unspecified side, initial encounter for closed fracture: Secondary | ICD-10-CM

## 2015-07-20 DIAGNOSIS — W01198A Fall on same level from slipping, tripping and stumbling with subsequent striking against other object, initial encounter: Secondary | ICD-10-CM | POA: Diagnosis not present

## 2015-07-20 DIAGNOSIS — S299XXA Unspecified injury of thorax, initial encounter: Secondary | ICD-10-CM | POA: Diagnosis present

## 2015-07-20 DIAGNOSIS — Y998 Other external cause status: Secondary | ICD-10-CM | POA: Diagnosis not present

## 2015-07-20 DIAGNOSIS — G35 Multiple sclerosis: Secondary | ICD-10-CM | POA: Diagnosis not present

## 2015-07-20 DIAGNOSIS — Z87891 Personal history of nicotine dependence: Secondary | ICD-10-CM | POA: Insufficient documentation

## 2015-07-20 DIAGNOSIS — I10 Essential (primary) hypertension: Secondary | ICD-10-CM | POA: Diagnosis not present

## 2015-07-20 DIAGNOSIS — Z79899 Other long term (current) drug therapy: Secondary | ICD-10-CM | POA: Insufficient documentation

## 2015-07-20 DIAGNOSIS — Y9389 Activity, other specified: Secondary | ICD-10-CM | POA: Insufficient documentation

## 2015-07-20 MED ORDER — ONDANSETRON 4 MG PO TBDP: ORAL_TABLET | ORAL | Status: DC

## 2015-07-20 MED ORDER — OXYCODONE-ACETAMINOPHEN 5-325 MG PO TABS: 1.0000 | ORAL_TABLET | ORAL | Status: DC | PRN

## 2015-07-20 MED ORDER — KETOROLAC TROMETHAMINE 60 MG/2ML IM SOLN
60.0000 mg | Freq: Once | INTRAMUSCULAR | Status: AC
  Administered 2015-07-20: 60 mg via INTRAMUSCULAR
  Filled 2015-07-20: qty 2

## 2015-07-20 MED ORDER — DIAZEPAM 5 MG PO TABS
5.0000 mg | ORAL_TABLET | Freq: Once | ORAL | Status: AC
  Administered 2015-07-20: 5 mg via ORAL
  Filled 2015-07-20: qty 1

## 2015-07-20 NOTE — ED Notes (Addendum)
Per pt, pt states she fell at the store at 1115am this morning, tripped over something, fell face down on a hard floor. Denies LOC. Pt c/o R posterior rib/back pain and tenderness and sore all over. Denies hitting head, not on blood thinners. AAOX4, ambulatory with steady gait.

## 2015-07-20 NOTE — Discharge Instructions (Signed)
Your evaluated in the ED today for your back pain and found to have 2 broken ribs on your right side, the sixth in the eighth. You'll be treated for this with pain medicines, do not take these medications before driving or operating machinery. Please follow-up with your doctor next week for reevaluation. Return to ED for any new or worsening symptoms including shortness of breath, worsening chest pain, difficulties coughing, fever.  Rib Fracture A rib fracture is a break or crack in one of the bones of the ribs. The ribs are a group of long, curved bones that wrap around your chest and attach to your spine. They protect your lungs and other organs in the chest cavity. A broken or cracked rib is often painful, but most do not cause other problems. Most rib fractures heal on their own over time. However, rib fractures can be more serious if multiple ribs are broken or if broken ribs move out of place and push against other structures. CAUSES   A direct blow to the chest. For example, this could happen during contact sports, a car accident, or a fall against a hard object.  Repetitive movements with high force, such as pitching a baseball or having severe coughing spells. SYMPTOMS   Pain when you breathe in or cough.  Pain when someone presses on the injured area. DIAGNOSIS  Your caregiver will perform a physical exam. Various imaging tests may be ordered to confirm the diagnosis and to look for related injuries. These tests may include a chest X-ray, computed tomography (CT), magnetic resonance imaging (MRI), or a bone scan. TREATMENT  Rib fractures usually heal on their own in 1-3 months. The longer healing period is often associated with a continued cough or other aggravating activities. During the healing period, pain control is very important. Medication is usually given to control pain. Hospitalization or surgery may be needed for more severe injuries, such as those in which multiple ribs are  broken or the ribs have moved out of place.  HOME CARE INSTRUCTIONS   Avoid strenuous activity and any activities or movements that cause pain. Be careful during activities and avoid bumping the injured rib.  Gradually increase activity as directed by your caregiver.  Only take over-the-counter or prescription medications as directed by your caregiver. Do not take other medications without asking your caregiver first.  Apply ice to the injured area for the first 1-2 days after you have been treated or as directed by your caregiver. Applying ice helps to reduce inflammation and pain.  Put ice in a plastic bag.  Place a towel between your skin and the bag.   Leave the ice on for 15-20 minutes at a time, every 2 hours while you are awake.  Perform deep breathing as directed by your caregiver. This will help prevent pneumonia, which is a common complication of a broken rib. Your caregiver may instruct you to:  Take deep breaths several times a day.  Try to cough several times a day, holding a pillow against the injured area.  Use a device called an incentive spirometer to practice deep breathing several times a day.  Drink enough fluids to keep your urine clear or pale yellow. This will help you avoid constipation.   Do not wear a rib belt or binder. These restrict breathing, which can lead to pneumonia.  SEEK IMMEDIATE MEDICAL CARE IF:   You have a fever.   You have difficulty breathing or shortness of breath.   You  develop a continual cough, or you cough up thick or bloody sputum.  You feel sick to your stomach (nausea), throw up (vomit), or have abdominal pain.   You have worsening pain not controlled with medications.  MAKE SURE YOU:  Understand these instructions.  Will watch your condition.  Will get help right away if you are not doing well or get worse.   This information is not intended to replace advice given to you by your health care provider. Make sure  you discuss any questions you have with your health care provider.   Document Released: 08/06/2005 Document Revised: 04/08/2013 Document Reviewed: 10/08/2012 Elsevier Interactive Patient Education [employer]

## 2015-07-20 NOTE — ED Provider Notes (Signed)
CSN: [id number]     Arrival date & time 07/20/15  2128 History  By signing my name below, I, Catherine Hubbard, attest that this documentation has been prepared under the direction and in the presence of [employer], PA-C. Electronically Signed: Tanda Hubbard, ED Scribe. 07/20/2015. 10:04 PM.  Chief Complaint  Patient presents with  . Back Pain   The history is provided by the patient. No language interpreter was used.     HPI Comments: Catherine Hubbard is a 54 y.o. female who presents to the Emergency Department complaining of gradual onset, constant, stabbing, mid back pain and anterior rib pain bilaterally s/p ground level fall that occurred this morning at 11:15 AM. Pt states that she tripped and fell, landing flat onto her stomach, causing the rib pain. No LOC. The pain is exacerbated with deep breathing. She took Tylenol and Vicodin without relief.  Pain reported as moderate now.Denies shortness of breath or any other associated symptoms.   Past Medical History  Diagnosis Date  . Multiple sclerosis (HCC)   . Vision abnormalities   . Hypertension   . Headache   . Hearing loss    Past Surgical History  Procedure Laterality Date  . Carpal tunnel release Bilateral   . Ulnar nerve repair Bilateral   . Bariatric sleeve    . Ankle fracture surgery Bilateral    Family History  Problem Relation Age of Onset  . Cancer Mother   . Stroke Mother   . Cancer Father    Social History  Substance Use Topics  . Smoking status: Former Games developer  . Smokeless tobacco: Never Used  . Alcohol Use: 0.0 oz/week    0 Standard drinks or equivalent per week     Comment: Sts. she drinks beer, liquor daily, varying amts/fim   OB History    No data available     Review of Systems  Respiratory: Negative for shortness of breath.   Cardiovascular: Negative for chest pain.  Musculoskeletal: Positive for back pain and arthralgias (Anterior rib pain).  All other systems reviewed and are  negative.     Allergies  Percocet  Home Medications   Prior to Admission medications   Medication Sig Start Date End Date Taking? Authorizing Provider  amitriptyline (ELAVIL) 25 MG tablet One or two at bedtime Patient not taking: Reported on 05/26/2015 01/11/15   Asa Lente, MD  baclofen (LIORESAL) 10 MG tablet TAKE UP TO 3 TABLETS BY MOUTH AT NIGHT 06/24/15   Asa Lente, MD  buPROPion (WELLBUTRIN XL) 300 MG 24 hr tablet TAKE 1 TABLET (300 MG TOTAL) BY MOUTH DAILY. 07/13/15   Asa Lente, MD  cyclobenzaprine (FLEXERIL) 5 MG tablet Take 1 tablet (5 mg total) by mouth every 8 (eight) hours as needed for muscle spasms. 07/13/15   Asa Lente, MD  FLUoxetine (PROZAC) 20 MG capsule Take 1 capsule (20 mg total) by mouth daily. 10/06/14   Asa Lente, MD  HYDROcodone-acetaminophen (NORCO/VICODIN) 5-325 MG tablet Take 1-2 tablets by mouth every 4 (four) hours as needed for severe pain. 06/07/15   Asa Lente, MD  Interferon Beta-1a 44 MCG/0.5ML SOAJ Inject 44 mcg into the skin.    Historical Provider, MD  modafinil (PROVIGIL) 200 MG tablet Take 1 tablet in the morning and 1 tablet at noon. 04/06/15   Asa Lente, MD  ondansetron (ZOFRAN ODT) 4 MG disintegrating tablet 4mg  ODT q4 hours prn nausea/vomit 07/20/15   Joycie Peek, PA-C  oxyCODONE-acetaminophen (  PERCOCET) 5-325 MG tablet Take 1-2 tablets by mouth every 4 (four) hours as needed. 07/20/15   Joycie Peek, PA-C  rOPINIRole (REQUIP) 0.5 MG tablet One or two at bedtime 10/06/14   Asa Lente, MD  valsartan (DIOVAN) 80 MG tablet Take 1 tablet (80 mg total) by mouth daily. 10/06/14   Asa Lente, MD   Triage Vitals: BP 116/88 mmHg  Pulse 86  Temp(Src) 97.9 F (36.6 C) (Oral)  Resp 16  Wt 175 lb 9.6 oz (79.652 kg)  SpO2 96%   Physical Exam  Constitutional: She is oriented to person, place, and time. She appears well-developed and well-nourished. No distress.  HENT:  Head: Normocephalic and  atraumatic.  Eyes: Conjunctivae and EOM are normal.  Neck: Neck supple. No tracheal deviation present.  Cardiovascular: Normal rate, regular rhythm and normal heart sounds.   Pulmonary/Chest: Effort normal and breath sounds normal. No respiratory distress. She has no wheezes. She has no rhonchi. She has no rales. She exhibits tenderness.  TTP anterior inferior rib cage  Abdominal: Soft. There is no tenderness.  Musculoskeletal: Normal range of motion. She exhibits tenderness.  Diffuse tenderness throughout para spinal thoracic musculature   Neurological: She is alert and oriented to person, place, and time.  Skin: Skin is warm and dry.  Psychiatric: She has a normal mood and affect. Her behavior is normal.  Nursing note and vitals reviewed.   ED Course  Procedures (including critical care time)  DIAGNOSTIC STUDIES: Oxygen Saturation is 96% on RA, normal by my interpretation.    COORDINATION OF CARE: 10:01 PM-Discussed treatment plan which includes CXR with pt at bedside and pt agreed to plan.   Imaging Review Dg Chest 2 View  07/20/2015  CLINICAL DATA:  Status post fall. No shortness of breath. Lower chest pain. EXAM: CHEST  2 VIEW COMPARISON:  None. FINDINGS: There is no focal parenchymal opacity. There is no pleural effusion or pneumothorax. The heart and mediastinal contours are unremarkable. Right lateral eighth and left lateral sixth rib deformity concerning for a nondisplaced fractures. IMPRESSION: No active cardiopulmonary disease. Right lateral eighth and left lateral sixth rib deformity concerning for a nondisplaced fractures. Electronically Signed   By: Elige Ko   On: 07/20/2015 22:43   I have personally reviewed and evaluated these images as part of my medical decision-making.   EKG Interpretation None      Meds given in ED:  Medications  ketorolac (TORADOL) injection 60 mg (60 mg Intramuscular Given 07/20/15 2214)  diazepam (VALIUM) tablet 5 mg (5 mg Oral Given  07/20/15 2214)    New Prescriptions   ONDANSETRON (ZOFRAN ODT) 4 MG DISINTEGRATING TABLET     ODT q4 hours prn nausea/vomit   OXYCODONE-ACETAMINOPHEN (PERCOCET) 5-325 MG TABLET    Take 1-2 tablets by mouth every 4 (four) hours as needed.   Filed Vitals:   07/20/15 2133 07/20/15 2137  BP:  116/88  Pulse:  86  Temp:  97.9 F (36.6 C)  TempSrc:  Oral  Resp:  16  Weight: 79.652 kg   SpO2:  96%    MDM  Evelena Asa is a 54 y.o. female who presents for evaluation of back pain after a mechanical fall forward. Patient with diffuse tenderness throughout right ribs. Benign cardiopulmonary exam. X-ray shows concern for lateral sixth and eighth rib fracture. No pneumothorax. Patient overall appears healthy and is appropriate for outpatient follow-up. Will DC with pain medicines. No evidence of other acute or emergent pathology at  this time.  Final diagnoses:  Closed rib fracture, unspecified laterality, initial encounter   I personally performed the services described in this documentation, which was scribed in my presence. The recorded information has been reviewed and is accurate.    Joycie Peek, PA-C 07/20/15 2303  Rolland Porter, MD 07/27/15 [phone]

## 2015-07-30 ENCOUNTER — Emergency Department (HOSPITAL_COMMUNITY)
Admission: EM | Admit: 2015-07-30 | Discharge: 2015-07-30 | Disposition: A | Discharge status: Home / Self Care | Payer: Federal, State, Local not specified - PPO | Attending: Emergency Medicine | Admitting: Emergency Medicine

## 2015-07-30 ENCOUNTER — Encounter (HOSPITAL_COMMUNITY): Payer: Self-pay | Admitting: Adult Health

## 2015-07-30 ENCOUNTER — Emergency Department (HOSPITAL_COMMUNITY): Payer: Federal, State, Local not specified - PPO

## 2015-07-30 DIAGNOSIS — I10 Essential (primary) hypertension: Secondary | ICD-10-CM | POA: Diagnosis not present

## 2015-07-30 DIAGNOSIS — R0789 Other chest pain: Secondary | ICD-10-CM | POA: Insufficient documentation

## 2015-07-30 DIAGNOSIS — R079 Chest pain, unspecified: Secondary | ICD-10-CM | POA: Diagnosis present

## 2015-07-30 DIAGNOSIS — G35 Multiple sclerosis: Secondary | ICD-10-CM | POA: Diagnosis not present

## 2015-07-30 DIAGNOSIS — W19XXXD Unspecified fall, subsequent encounter: Secondary | ICD-10-CM | POA: Insufficient documentation

## 2015-07-30 DIAGNOSIS — Z87891 Personal history of nicotine dependence: Secondary | ICD-10-CM | POA: Insufficient documentation

## 2015-07-30 DIAGNOSIS — Z79899 Other long term (current) drug therapy: Secondary | ICD-10-CM | POA: Diagnosis not present

## 2015-07-30 DIAGNOSIS — S2231XD Fracture of one rib, right side, subsequent encounter for fracture with routine healing: Secondary | ICD-10-CM | POA: Insufficient documentation

## 2015-07-30 LAB — CBC
HCT: 35.8 % — ABNORMAL LOW (ref 36.0–46.0)
Hemoglobin: 12.5 g/dL (ref 12.0–15.0)
MCH: 32.9 pg (ref 26.0–34.0)
MCHC: 34.9 g/dL (ref 30.0–36.0)
MCV: 94.2 fL (ref 78.0–100.0)
PLATELETS: 227 10*3/uL (ref 150–400)
RBC: 3.8 MIL/uL — AB (ref 3.87–5.11)
RDW: 11.9 % (ref 11.5–15.5)
WBC: 5.7 10*3/uL (ref 4.0–10.5)

## 2015-07-30 LAB — BASIC METABOLIC PANEL
ANION GAP: 11 (ref 5–15)
BUN: 11 mg/dL (ref 6–20)
CALCIUM: 9.4 mg/dL (ref 8.9–10.3)
CO2: 25 mmol/L (ref 22–32)
CREATININE: 0.49 mg/dL (ref 0.44–1.00)
Chloride: 95 mmol/L — ABNORMAL LOW (ref 101–111)
GLUCOSE: 85 mg/dL (ref 65–99)
Potassium: 3.6 mmol/L (ref 3.5–5.1)
Sodium: 131 mmol/L — ABNORMAL LOW (ref 135–145)

## 2015-07-30 LAB — I-STAT TROPONIN, ED: TROPONIN I, POC: 0 ng/mL (ref 0.00–0.08)

## 2015-07-30 MED ORDER — HYDROMORPHONE HCL 1 MG/ML IJ SOLN
0.5000 mg | Freq: Once | INTRAMUSCULAR | Status: AC
  Administered 2015-07-30: 0.5 mg via INTRAVENOUS
  Filled 2015-07-30: qty 1

## 2015-07-30 MED ORDER — ONDANSETRON HCL 4 MG PO TABS: 4.0000 mg | ORAL_TABLET | Freq: Four times a day (QID) | ORAL | Status: DC

## 2015-07-30 MED ORDER — OXYCODONE-ACETAMINOPHEN 5-325 MG PO TABS: 1.0000 | ORAL_TABLET | ORAL | Status: DC | PRN

## 2015-07-30 MED ORDER — HYDROCODONE-ACETAMINOPHEN 5-325 MG PO TABS: 1.0000 | ORAL_TABLET | ORAL | Status: DC | PRN

## 2015-07-30 MED ORDER — FENTANYL CITRATE (PF) 100 MCG/2ML IJ SOLN
INTRAMUSCULAR | Status: AC
  Filled 2015-07-30: qty 2

## 2015-07-30 MED ORDER — FENTANYL CITRATE (PF) 100 MCG/2ML IJ SOLN
50.0000 ug | Freq: Once | INTRAMUSCULAR | Status: AC
  Administered 2015-07-30: 50 ug via INTRAVENOUS

## 2015-07-30 NOTE — ED Provider Notes (Signed)
CSN: [id number]     Arrival date & time 07/30/15  0033 History   First MD Initiated Contact with Patient 07/30/15 0124     Chief Complaint  Patient presents with  . Chest Pain     (Consider location/radiation/quality/duration/timing/severity/associated sxs/prior Treatment) HPI Comments: Patient here for evaluation of right posterior shoulder pain that is sharp and severe, and extends to right lateral chest wall. She was seen on 07/20/15 after a fall with diagnosis of right rib fracture, but reports current pain is "different and worse." No fever. The pain is worse with certain movements and with deep breathing and has episodes of intense, sharp pain that takes away her breath. No nausea or vomiting.   Patient is a 54 y.o. female presenting with chest pain. The history is provided by the patient. No language interpreter was used.  Chest Pain Associated symptoms: no abdominal pain, no cough, no fever, no nausea, no shortness of breath and not vomiting     Past Medical History  Diagnosis Date  . Multiple sclerosis (HCC)   . Vision abnormalities   . Hypertension   . Headache   . Hearing loss    Past Surgical History  Procedure Laterality Date  . Carpal tunnel release Bilateral   . Ulnar nerve repair Bilateral   . Bariatric sleeve    . Ankle fracture surgery Bilateral    Family History  Problem Relation Age of Onset  . Cancer Mother   . Stroke Mother   . Cancer Father    Social History  Substance Use Topics  . Smoking status: Former Games developer  . Smokeless tobacco: Never Used  . Alcohol Use: 0.0 oz/week    0 Standard drinks or equivalent per week     Comment: Sts. she drinks beer, liquor daily, varying amts/fim   OB History    No data available     Review of Systems  Constitutional: Negative for fever.  Respiratory: Negative for cough and shortness of breath.   Cardiovascular: Positive for chest pain.  Gastrointestinal: Negative for nausea, vomiting and abdominal pain.   Musculoskeletal:       See HPI.  Skin: Negative for color change.      Allergies  Percocet  Home Medications   Prior to Admission medications   Medication Sig Start Date End Date Taking? Authorizing Provider  amitriptyline (ELAVIL) 25 MG tablet One or two at bedtime Patient not taking: Reported on 05/26/2015 01/11/15   Asa Lente, MD  baclofen (LIORESAL) 10 MG tablet TAKE UP TO 3 TABLETS BY MOUTH AT NIGHT 06/24/15   Asa Lente, MD  buPROPion (WELLBUTRIN XL) 300 MG 24 hr tablet TAKE 1 TABLET (300 MG TOTAL) BY MOUTH DAILY. 07/13/15   Asa Lente, MD  cyclobenzaprine (FLEXERIL) 5 MG tablet Take 1 tablet (5 mg total) by mouth every 8 (eight) hours as needed for muscle spasms. 07/13/15   Asa Lente, MD  FLUoxetine (PROZAC) 20 MG capsule Take 1 capsule (20 mg total) by mouth daily. 10/06/14   Asa Lente, MD  HYDROcodone-acetaminophen (NORCO/VICODIN) 5-325 MG tablet Take 1-2 tablets by mouth every 4 (four) hours as needed for severe pain. 06/07/15   Asa Lente, MD  Interferon Beta-1a 44 MCG/0.5ML SOAJ Inject 44 mcg into the skin.    Historical Provider, MD  modafinil (PROVIGIL) 200 MG tablet Take 1 tablet in the morning and 1 tablet at noon. 04/06/15   Asa Lente, MD  ondansetron (ZOFRAN ODT) 4 MG disintegrating  tablet 4mg  ODT q4 hours prn nausea/vomit 07/20/15   Joycie Peek, PA-C  oxyCODONE-acetaminophen (PERCOCET) 5-325 MG tablet Take 1-2 tablets by mouth every 4 (four) hours as needed. 07/20/15   Joycie Peek, PA-C  rOPINIRole (REQUIP) 0.5 MG tablet One or two at bedtime 10/06/14   Asa Lente, MD  valsartan (DIOVAN) 80 MG tablet Take 1 tablet (80 mg total) by mouth daily. 10/06/14   Asa Lente, MD   BP 127/86 mmHg  Pulse 75  Temp(Src) 98.3 F (36.8 C) (Oral)  Resp 16  SpO2 99% Physical Exam  Constitutional: She is oriented to person, place, and time. She appears well-developed and well-nourished. No distress.  Alert, awake, non-toxic,  ambulatory.  HENT:  Head: Atraumatic.  Eyes: Conjunctivae are normal.  Neck: Normal range of motion.  Cardiovascular: Normal rate.   No murmur heard. Pulmonary/Chest: Effort normal. She has no wheezes. She has no rales.  Musculoskeletal: Normal range of motion.  Neurological: She is alert and oriented to person, place, and time.  Skin: Skin is warm and dry.    ED Course  Procedures (including critical care time) Labs Review Labs Reviewed  BASIC METABOLIC PANEL - Abnormal; Notable for the following:    Sodium 131 (*)    Chloride 95 (*)    All other components within normal limits  CBC - Abnormal; Notable for the following:    RBC 3.80 (*)    HCT 35.8 (*)    All other components within normal limits  I-STAT TROPOININ, ED   Results for orders placed or performed during the hospital encounter of 07/30/15  Basic metabolic panel  Result Value Ref Range   Sodium 131 (L) 135 - 145 mmol/L   Potassium 3.6 3.5 - 5.1 mmol/L   Chloride 95 (L) 101 - 111 mmol/L   CO2 25 22 - 32 mmol/L   Glucose, Bld 85 65 - 99 mg/dL   BUN 11 6 - 20 mg/dL   Creatinine, Ser 0.72 0.44 - 1.00 mg/dL   Calcium 9.4 8.9 - 25.7 mg/dL   GFR calc non Af Amer >60 >60 mL/min   GFR calc Af Amer >60 >60 mL/min   Anion gap 11 5 - 15  CBC  Result Value Ref Range   WBC 5.7 4.0 - 10.5 K/uL   RBC 3.80 (L) 3.87 - 5.11 MIL/uL   Hemoglobin 12.5 12.0 - 15.0 g/dL   HCT 50.5 (L) 18.3 - 35.8 %   MCV 94.2 78.0 - 100.0 fL   MCH 32.9 26.0 - 34.0 pg   MCHC 34.9 30.0 - 36.0 g/dL   RDW 25.1 89.8 - 42.1 %   Platelets 227 150 - 400 K/uL  I-stat troponin, ED (not at [facility], [facility])  Result Value Ref Range   Troponin i, poc 0.00 0.00 - 0.08 ng/mL   Comment 3           Dg Chest 2 View  07/30/2015  CLINICAL DATA:  Right-sided chest pain. EXAM: CHEST  2 VIEW COMPARISON:  Chest radiographs 07/20/2015 FINDINGS: Cardiomediastinal contours are unchanged with mild tortuosity of the thoracic aorta. Pulmonary vasculature is normal. No  consolidation, pleural effusion, or pneumothorax. No acute osseous abnormalities are seen. Unchanged cortical irregularity about right lateral eighth and left sixth ribs. Chronic change about the right shoulder. IMPRESSION: 1.  No acute pulmonary process. 2. Bilateral rib fractures are unchanged from prior exam. Electronically Signed   By: Rubye Oaks M.D.   On: 07/30/2015 02:05   Dg  Chest 2 View  07/20/2015  CLINICAL DATA:  Status post fall. No shortness of breath. Lower chest pain. EXAM: CHEST  2 VIEW COMPARISON:  None. FINDINGS: There is no focal parenchymal opacity. There is no pleural effusion or pneumothorax. The heart and mediastinal contours are unremarkable. Right lateral eighth and left lateral sixth rib deformity concerning for a nondisplaced fractures. IMPRESSION: No active cardiopulmonary disease. Right lateral eighth and left lateral sixth rib deformity concerning for a nondisplaced fractures. Electronically Signed   By: Elige Ko   On: 07/20/2015 22:43    Imaging Review No results found. I have personally reviewed and evaluated these images and lab results as part of my medical decision-making.   EKG Interpretation   Date/Time:  Saturday July 30 2015 00:59:53 EST Ventricular Rate:  82 PR Interval:  164 QRS Duration: 101 QT Interval:  439 QTC Calculation: 513 R Axis:   -55 Text Interpretation:  Sinus rhythm Ventricular premature complex Left axis  deviation Prolonged QT interval No significant change since last tracing  Sept 2016 Confirmed by WARD,  DO, KRISTEN ([id number]) on 07/30/2015 1:59:28 AM      MDM   Final diagnoses:  None    1. Chest wall pain 2. Known rib fractures  The patient returns to the emergency department with sharp, episodic chest pain since yesterday. Known rib fractures, diagnosed on 11/30 when seen and evaluated after a fall. Repeat CXR negative, troponin and lab tests tonight are unremarkable. Suspect spasm type pain associated with  fracture injury. She reports she is out of medication for pain. Will refill and encourage PCP follow up.    Elpidio Anis, PA-C 07/30/15 0235  Layla Maw Ward, DO 07/30/15 [phone]

## 2015-07-30 NOTE — Discharge Instructions (Signed)

## 2015-07-30 NOTE — ED Notes (Signed)
Presents with right sided shoulder blade and chest pain that took breath away and occurred 2-3 times over the past 24 hours. Pain is described as intense and associated with SOB and dizziness-hx of fractured ribs on rigth side-but this pain feels different and is more intense.

## 2015-10-05 ENCOUNTER — Telehealth: Payer: Self-pay | Admitting: Neurology

## 2015-10-05 MED ORDER — BUPROPION HCL ER (XL) 300 MG PO TB24: ORAL_TABLET | ORAL | Status: DC

## 2015-10-05 MED ORDER — FLUOXETINE HCL 20 MG PO CAPS: 20.0000 mg | ORAL_CAPSULE | Freq: Every day | ORAL | Status: DC

## 2015-10-05 NOTE — Telephone Encounter (Signed)
Pt called requesting refill for FLUoxetine (PROZAC) 20 MG capsule, buPROPion (WELLBUTRIN XL) 300 MG 24 hr tablet to [facility] [address]

## 2015-10-24 ENCOUNTER — Telehealth: Payer: Self-pay | Admitting: Neurology

## 2015-10-24 ENCOUNTER — Encounter: Payer: Self-pay | Admitting: Neurology

## 2015-10-24 ENCOUNTER — Ambulatory Visit (INDEPENDENT_AMBULATORY_CARE_PROVIDER_SITE_OTHER): Payer: Federal, State, Local not specified - PPO | Admitting: Neurology

## 2015-10-24 DIAGNOSIS — R3915 Urgency of urination: Secondary | ICD-10-CM | POA: Diagnosis not present

## 2015-10-24 DIAGNOSIS — G35 Multiple sclerosis: Secondary | ICD-10-CM | POA: Diagnosis not present

## 2015-10-24 DIAGNOSIS — G2581 Restless legs syndrome: Secondary | ICD-10-CM

## 2015-10-24 DIAGNOSIS — R260 Ataxic gait: Secondary | ICD-10-CM | POA: Diagnosis not present

## 2015-10-24 DIAGNOSIS — R5383 Other fatigue: Secondary | ICD-10-CM

## 2015-10-24 DIAGNOSIS — G4700 Insomnia, unspecified: Secondary | ICD-10-CM

## 2015-10-24 DIAGNOSIS — F418 Other specified anxiety disorders: Secondary | ICD-10-CM

## 2015-10-24 DIAGNOSIS — R200 Anesthesia of skin: Secondary | ICD-10-CM | POA: Diagnosis not present

## 2015-10-24 MED ORDER — CYCLOBENZAPRINE HCL 5 MG PO TABS: 5.0000 mg | ORAL_TABLET | Freq: Three times a day (TID) | ORAL | Status: DC | PRN

## 2015-10-24 MED ORDER — ALPRAZOLAM 0.5 MG PO TABS: ORAL_TABLET | ORAL | Status: DC

## 2015-10-24 MED ORDER — FLUOXETINE HCL 20 MG PO CAPS: 20.0000 mg | ORAL_CAPSULE | Freq: Every day | ORAL | Status: DC

## 2015-10-24 MED ORDER — HYDROCODONE-ACETAMINOPHEN 5-325 MG PO TABS: 1.0000 | ORAL_TABLET | Freq: Every day | ORAL | Status: DC | PRN

## 2015-10-24 MED ORDER — BUPROPION HCL ER (XL) 300 MG PO TB24: ORAL_TABLET | ORAL | Status: DC

## 2015-10-24 NOTE — Telephone Encounter (Signed)
LMOM that rx. faxed to [facility] in [place]/fim

## 2015-10-24 NOTE — Progress Notes (Signed)
[facility]  PATIENT: Catherine Hubbard DOB: [birth year]   _________________________________   HISTORICAL  CHIEF COMPLAINT:  Chief Complaint  Patient presents with  . Multiple Sclerosis    Sts. she continues to tolerate Rebif well.  Is fighting recurrent uti right now--on her 2nd antibiotic.  Sts. balance is off; she thinks due to lights.  No more falls than usual--last was a month ago.  Sts. she also fell in November 2016 and sts. she suffered 2 rib fx's (right side) with that fall/fim  . RLS    Sts. for the last 2 weeks she has only been taking Benadryl at night because she sleeps well with this.  Sts.  her husband tells her she still moves her legs in her sleep, but movement doesn't wake her up/fim    HISTORY OF PRESENT ILLNESS:  Catherine Hubbard is a 55 year old woman with MS.    She is on Rebif and tolerates it well.     She denies any exacerbations or significant changes in her neurologic status.     LBP and leg pain:  Last visit, she had a lot of lower back/buttock/hip pain and we did piriformis muscle injecitons and right trochanteric bursa injection.    She felt much better  Afterwards and pain is still doing better.     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      Gait/strength/sensation:   Gait is usually okay though she stumbles at times.    She tripped and broke 2 ribs recently.     Sometimes she feels like she is being pushed to the right as she walks.   She has tingling in her hands and face.  She denies weakness but has had some spasticity and rarely takes baclofen.     Bladder:   She has a recent bad UTI and is on her second antibiotic course and finally doing better.   At baseline, she has mild urinary frequency.   With the UTI, she had one episode of incontinence. .  She has occasional incomplete emptying.   She has occasional  nocturia.  Vision:   She feels this is stable with rare mild diplopia and/or blurry vision when she reads a while or when very tired.  Mood:   She has less depression and anxiety on Wellbutrin and Prozac.   She still sometimes feels mildly depressed.  She was without her antidepressants x 1 week and felt much worse.   She has some apathy.   She drinks alcohol most days (3-4 beers or more) but some nights does not drink.     Cognition:   She reports mild difficulty with cognition, mostly occasional difficulty with short-term memory and verbal fluency.  She does not always complete tasks that she starts.  Fatigue:   She has fatigue and takes Provigil has needed .  She was tried on Ritalin or Adderall once but she did not like the way that she felt when she took it.    Sleep:   She had insomnia and RLS.  Ropinirole helps RLS some when it is worse (rarely takes it as mild most of the time).   Gabapentin did not help She notes difficulty falling asleep more than with sleep maintenance.  Baclofen helped some in the past.  MS History:   She was diagnosed with multiple sclerosis in 2009. Before that  time, she had noted some gait ataxia but had not had any imaging studies. In 2009, due to progressive hearing loss, she had an MRI of the brain and  then had a lumbar puncture that also confirmed the diagnosis.  I have some of her early MRI reports there are several foci noted in the cervical and thoracic spinal cord in the brain, there were multiple hyperintense foci, mostly in the periventricular deep white matter many of these were contrast enhancing on 07/26/2008. There was another enhancing lesion adjacent to C2 in the spinal cord.  Initially, she was placed on Avonex. She did not feel good and she stopped. She then tried Copaxone but also stopped after a while. She moved here in 2014 and saw Dr. Renne Crigler at [facility]. She was placed on Rebif. She tolerates Rebif well.      MRI reports from 12/04/2012  showed  multiple foci in the spinal cord at C2, C3-C4, and C4-C5. There were also foci at T4, T7, T9 and T10. The MRI of the brain showed 2 nonenhancing foci not present from studies in 2011. She reports having an MRI at [facility] 2015 but we do not have those films.   REVIEW OF SYSTEMS: Constitutional: No fevers, chills, sweats, or change in appetite.    She reports fatigue and insomnia Eyes: see above.  No eye pain  But some light sensitivity. Ear, nose and throat: No hearing loss, ear pain, nasal congestion, sore throat.   tinnitus Cardiovascular: No chest pain, palpitations Respiratory: No shortness of breath at rest or with exertion.   Some wheezes, once on asthma med's Gastrointestinal: No nausea, vomiting, diarrhea, abdominal pain, fecal incontinence. Genitourinary: see above Musculoskeletal: see above  She also notes neck pain Integumentary: No rash, pruritus, skin lesions Neurological: as above Psychiatric: as above Endocrine: No palpitations, diaphoresis, change in appetite, change in weigh or increased thirst.  Often feels cold Hematologic/Lymphatic: No anemia, purpura, petechiae. Allergic/Immunologic: No itchy/runny eyes, nasal congestion, recent allergic reactions, rashes  ALLERGIES: Allergies  Allergen Reactions  . Percocet [Oxycodone-Acetaminophen] Nausea And Vomiting    HOME MEDICATIONS:  Current outpatient prescriptions:  .  amitriptyline (ELAVIL) 25 MG tablet, One or two at bedtime, Disp: 60 tablet, Rfl: 5 .  baclofen (LIORESAL) 10 MG tablet, TAKE UP TO 3 TABLETS BY MOUTH AT NIGHT, Disp: 90 tablet, Rfl: 3 .  buPROPion (WELLBUTRIN XL) 300 MG 24 hr tablet, TAKE 1 TABLET (300 MG TOTAL) BY MOUTH DAILY., Disp: 30 tablet, Rfl: 11 .  cyclobenzaprine (FLEXERIL) 5 MG tablet, Take 1 tablet (5 mg total) by mouth every 8 (eight) hours as needed for muscle spasms., Disp: 90 tablet, Rfl: 1 .  FLUoxetine (PROZAC) 20 MG capsule, Take 1 capsule (20 mg total) by mouth daily., Disp: 30  capsule, Rfl: 11 .  HYDROcodone-acetaminophen (NORCO/VICODIN) 5-325 MG tablet, Take 1-2 tablets by mouth daily as needed., Disp: 30 tablet, Rfl: 0 .  Interferon Beta-1a 44 MCG/0.5ML SOAJ, Inject 44 mcg into the skin., Disp: , Rfl:  .  modafinil (PROVIGIL) 200 MG tablet, Take 1 tablet in the morning and 1 tablet at noon., Disp: 60 tablet, Rfl: 5 .  rOPINIRole (REQUIP) 0.5 MG tablet, One or two at bedtime, Disp: 60 tablet, Rfl: 11 .  valsartan (DIOVAN) 80 MG tablet, Take 1 tablet (80 mg total) by mouth daily., Disp: 30 tablet, Rfl: 11  PAST MEDICAL HISTORY: Past Medical History  Diagnosis Date  . Multiple sclerosis (HCC)   . Vision abnormalities   .  Hypertension   . Headache   . Hearing loss     PAST SURGICAL HISTORY: Past Surgical History  Procedure Laterality Date  . Carpal tunnel release Bilateral   . Ulnar nerve repair Bilateral   . Bariatric sleeve    . Ankle fracture surgery Bilateral     FAMILY HISTORY: Family History  Problem Relation Age of Onset  . Cancer Mother   . Stroke Mother   . Cancer Father     SOCIAL HISTORY:  Social History   Social History  . Marital Status: Married    Spouse Name: N/A  . Number of Children: N/A  . Years of Education: N/A   Occupational History  . Not on file.   Social History Main Topics  . Smoking status: Former Games developer  . Smokeless tobacco: Never Used  . Alcohol Use: 0.0 oz/week    0 Standard drinks or equivalent per week     Comment: Sts. she drinks beer, liquor daily, varying amts/fim  . Drug Use: No  . Sexual Activity: No   Other Topics Concern  . Not on file   Social History Narrative     PHYSICAL EXAM  There were no vitals filed for this visit.  There is no weight on file to calculate BMI.   General: The patient is well-developed and well-nourished and in no acute distress  Neck/HEENT:  The neck is non tender.  Good ROM  In neck.    Funduscopic examination shows normal optic discs and retinal vessels.    Oropharynx is normal   Musculoskeletal:   Mild lumbar tenderness.    No trochanteric tenderness.   Mild bilateral piriformis tenderness  Neurologic Exam  Mental status: The patient is alert and oriented x 3 at the time of the examination. The patient has apparent normal recent and remote memory, with an apparently normal attention span and concentration ability.   Speech is normal.  Cranial nerves: Extraocular movements are full.  . There is reduced right facial sensation to soft touch. Facial strength is normal.  Trapezius and sternocleidomastoid strength is normal. No dysarthria is noted.    She has reduced hearing on the right and Weber lateralizes right.  .  Motor:  Muscle bulk is normal.   Tone is normal. Strength is  5 / 5 in all 4 extremities.   Sensory:  She has symmetric sensation to touch and vibration in all 4 limbs.   Coordination: Cerebellar testing reveals good finger-nose-finger  bilaterally.  Gait and station: Station is normal.   Gait is minimally wide and arthritic. Tandem gait is mildly wide.   Reflexes: Deep tendon reflexes are symmetric and mildly increased in legs bilaterally.       DIAGNOSTIC DATA (LABS, IMAGING, TESTING) - I reviewed patient records, labs, notes, testing and imaging myself where available.     ASSESSMENT AND PLAN  Multiple sclerosis (HCC) - Plan: MR Brain W Wo Contrast, Ambulatory referral to Physical Therapy  Ataxic gait - Plan: MR Brain W Wo Contrast, Ambulatory referral to Physical Therapy  Numbness  Depression with anxiety  Restless leg  Insomnia  Other fatigue  Urinary urgency    1.   Renew Flexeril and hydrocodone. 2.   Continue  Rebif, check MRI to make sure no subclinical progression.   If present, consider change in DMT.   3.   Take combination of fluoxetine with bupropion for mood.   We discussed her alcohol use. This is better than in the past.  If possible, she should try to wean herself off alcohol further 4.    Continue ropinirole at night 5.   She will return to see me in 4 months or sooner if she has new or worsening neurologic symptoms.   Dechelle Attaway A. Epimenio Foot, MD, PhD 10/24/2015, 11:51 AM Certified in Neurology, Clinical Neurophysiology, Sleep Medicine, Pain Medicine and Neuroimaging  [facility] [address] [place], [place] [address] [phone]   .

## 2015-10-24 NOTE — Telephone Encounter (Signed)
Pt is requesting medication to assist with her MRI for claustrophobia.

## 2015-11-05 ENCOUNTER — Emergency Department (HOSPITAL_COMMUNITY): Payer: Federal, State, Local not specified - PPO

## 2015-11-05 ENCOUNTER — Emergency Department (HOSPITAL_COMMUNITY)
Admission: EM | Admit: 2015-11-05 | Discharge: 2015-11-06 | Disposition: A | Discharge status: Home / Self Care | Payer: Federal, State, Local not specified - PPO | Attending: Emergency Medicine | Admitting: Emergency Medicine

## 2015-11-05 ENCOUNTER — Encounter (HOSPITAL_COMMUNITY): Payer: Self-pay | Admitting: *Deleted

## 2015-11-05 DIAGNOSIS — I10 Essential (primary) hypertension: Secondary | ICD-10-CM | POA: Diagnosis not present

## 2015-11-05 DIAGNOSIS — M62830 Muscle spasm of back: Secondary | ICD-10-CM | POA: Diagnosis not present

## 2015-11-05 DIAGNOSIS — M549 Dorsalgia, unspecified: Secondary | ICD-10-CM | POA: Diagnosis present

## 2015-11-05 DIAGNOSIS — M940 Chondrocostal junction syndrome [Tietze]: Secondary | ICD-10-CM

## 2015-11-05 DIAGNOSIS — Z8781 Personal history of (healed) traumatic fracture: Secondary | ICD-10-CM | POA: Diagnosis not present

## 2015-11-05 DIAGNOSIS — Z8659 Personal history of other mental and behavioral disorders: Secondary | ICD-10-CM | POA: Diagnosis not present

## 2015-11-05 DIAGNOSIS — Z79899 Other long term (current) drug therapy: Secondary | ICD-10-CM | POA: Insufficient documentation

## 2015-11-05 DIAGNOSIS — R110 Nausea: Secondary | ICD-10-CM | POA: Diagnosis not present

## 2015-11-05 DIAGNOSIS — Z8669 Personal history of other diseases of the nervous system and sense organs: Secondary | ICD-10-CM | POA: Diagnosis not present

## 2015-11-05 DIAGNOSIS — Z87891 Personal history of nicotine dependence: Secondary | ICD-10-CM | POA: Insufficient documentation

## 2015-11-05 MED ORDER — KETOROLAC TROMETHAMINE 60 MG/2ML IM SOLN
60.0000 mg | Freq: Once | INTRAMUSCULAR | Status: AC
  Administered 2015-11-06: 60 mg via INTRAMUSCULAR
  Filled 2015-11-05: qty 2

## 2015-11-05 NOTE — ED Notes (Signed)
The pt is c/o rt low ribs since she fell in November.  Today the pain has increased in that area  No new injury

## 2015-11-06 MED ORDER — IBUPROFEN 600 MG PO TABS: 600.0000 mg | ORAL_TABLET | Freq: Four times a day (QID) | ORAL | Status: DC | PRN

## 2015-11-06 MED ORDER — DIAZEPAM 5 MG PO TABS: 5.0000 mg | ORAL_TABLET | Freq: Two times a day (BID) | ORAL | Status: DC | PRN

## 2015-11-06 NOTE — ED Provider Notes (Signed)
CSN: [id number]     Arrival date & time 11/05/15  2134 History   First MD Initiated Contact with Patient 11/05/15 2326     Chief Complaint  Patient presents with  . Rib Injury     (Consider location/radiation/quality/duration/timing/severity/associated sxs/prior Treatment) HPI Comments: 55 year old female with a history of MS, hypertension, and headaches presents to the emergency department for evaluation of back pain. Patient states that she feels pain in the right midportion of her back which radiates to the left and around to her left upper quadrant. She also feels radiation of the pain up towards her neck and down to her hips. She states that her pain has been present since yesterday after cutting some wood with a saw. Pain is waxing and waning in severity and will occasionally "pinch" her at the site where the pain originates. She states that deep breathing will occasionally aggravate her pain, though she denies any shortness of breath. Pain has not been relieved with Norco and Flexeril. She does report a history of similar symptoms following diagnosis of right rib fractures in November. She denies any new trauma or injury. No associated fevers or syncope. Patient denies vomiting or bowel changes. No abdominal pain. Patient has follow-up with a primary care doctor in 4 days. She has not yet established care with a PCP since moving to the area 3 years ago.  The history is provided by the patient. No language interpreter was used.    Past Medical History  Diagnosis Date  . Multiple sclerosis (HCC)   . Vision abnormalities   . Hypertension   . Headache   . Hearing loss    Past Surgical History  Procedure Laterality Date  . Carpal tunnel release Bilateral   . Ulnar nerve repair Bilateral   . Bariatric sleeve    . Ankle fracture surgery Bilateral    Family History  Problem Relation Age of Onset  . Cancer Mother   . Stroke Mother   . Cancer Father    Social History  Substance Use  Topics  . Smoking status: Former Games developer  . Smokeless tobacco: Never Used  . Alcohol Use: 0.0 oz/week    0 Standard drinks or equivalent per week     Comment: Sts. she drinks beer, liquor daily, varying amts/fim   OB History    No data available      Review of Systems  Constitutional: Negative for fever.  Respiratory: Negative for shortness of breath.   Gastrointestinal: Positive for nausea. Negative for vomiting.  Musculoskeletal: Positive for back pain.  Neurological: Negative for syncope and weakness.  All other systems reviewed and are negative. Ten systems reviewed and are negative for acute change, except as noted in the HPI.    Allergies  Percocet  Home Medications   Prior to Admission medications   Medication Sig Start Date End Date Taking? Authorizing Provider  ALPRAZolam Prudy Feeler) 0.5 MG tablet Take one tablet 30 minutes prior to mri.  May repeat once if needed.  You may not drive, operate dangerous equipment or drink alcohol while taking this medication. 10/24/15   Asa Lente, MD  amitriptyline (ELAVIL) 25 MG tablet One or two at bedtime 01/11/15   Asa Lente, MD  baclofen (LIORESAL) 10 MG tablet TAKE UP TO 3 TABLETS BY MOUTH AT NIGHT 06/24/15   Asa Lente, MD  buPROPion (WELLBUTRIN XL) 300 MG 24 hr tablet TAKE 1 TABLET (300 MG TOTAL) BY MOUTH DAILY. 10/24/15   Richard Aida Puffer,  MD  cyclobenzaprine (FLEXERIL) 5 MG tablet Take 1 tablet (5 mg total) by mouth every 8 (eight) hours as needed for muscle spasms. 10/24/15   Asa Lente, MD  FLUoxetine (PROZAC) 20 MG capsule Take 1 capsule (20 mg total) by mouth daily. 10/24/15   Asa Lente, MD  HYDROcodone-acetaminophen (NORCO/VICODIN) 5-325 MG tablet Take 1-2 tablets by mouth daily as needed. 10/24/15   Asa Lente, MD  Interferon Beta-1a 44 MCG/0.5ML SOAJ Inject 44 mcg into the skin.    Historical Provider, MD  modafinil (PROVIGIL) 200 MG tablet Take 1 tablet in the morning and 1 tablet at noon. 04/06/15    Asa Lente, MD  rOPINIRole (REQUIP) 0.5 MG tablet One or two at bedtime 10/06/14   Asa Lente, MD  valsartan (DIOVAN) 80 MG tablet Take 1 tablet (80 mg total) by mouth daily. 10/06/14   Asa Lente, MD   BP 137/95 mmHg  Pulse 85  Temp(Src) 98 F (36.7 C) (Oral)  Resp 11  SpO2 96%   Physical Exam  Constitutional: She is oriented to person, place, and time. She appears well-developed and well-nourished. No distress.  Nontoxic/nonseptic appearing  HENT:  Head: Normocephalic and atraumatic.  Eyes: Conjunctivae and EOM are normal. No scleral icterus.  Neck: Normal range of motion.  Cardiovascular: Normal rate, regular rhythm and intact distal pulses.   Pulmonary/Chest: Effort normal. No respiratory distress. She has no wheezes.  Chest expansion symmetric. Lungs CTAB.   Abdominal: Soft. She exhibits no distension. There is no tenderness. There is no rebound.  Soft, nontender abdomen. No masses or peritoneal signs.  Musculoskeletal: Normal range of motion.       Cervical back: Normal.       Thoracic back: She exhibits tenderness and pain. She exhibits normal range of motion, no swelling, no deformity and no spasm.       Lumbar back: Normal.       Back:  No C/T/L midline TTP. No bony deformities, step offs, or crepitus.  Neurological: She is alert and oriented to person, place, and time. She exhibits normal muscle tone. Coordination normal.  Patient moving all extremities.  Skin: Skin is warm and dry. No rash noted. She is not diaphoretic. No erythema. No pallor.  Psychiatric: She has a normal mood and affect. Her behavior is normal.  Nursing note and vitals reviewed.   ED Course  Procedures (including critical care time) Labs Review Labs Reviewed - No data to display  Imaging Review Dg Chest 2 View  11/05/2015  CLINICAL DATA:  The pt is c/o rt low ribs since she fell in November. Yesterday the pain had increased in that area after using a power saw to cut fire wood.  No new injury EXAM: CHEST  2 VIEW COMPARISON:  07/30/2015 FINDINGS: Heart size is normal. Lungs are clear. No pulmonary edema. Remote bilateral rib fractures are noted. Mild mid thoracic spondylosis. IMPRESSION: No evidence for acute  abnormality. Electronically Signed   By: Norva Pavlov M.D.   On: 11/05/2015 22:52   I have personally reviewed and evaluated these images and lab results as part of my medical decision-making.   EKG Interpretation None      MDM   Final diagnoses:  Acute costochondritis  Muscle spasm of back    55 year old female presents to the emergency department for evaluation of right mid back pain. Pain is reproducible on palpation of b/l shoulder blades; R>L. Onset was after patient was sawing wood yesterday.  No hx of recent trauma/injury. No c/o dyspnea. No hypoxia today. No tachycardia since roomed in ED. Suspect MSK etiology. Patient with PCP f/u in 4 days. I do not believe further emergent work up is indicated at this time. Have advised continued use of Norco PRN which was recently prescribed by the patient's neurologist. Will add Valium for spasm as Flexeril providing little relief. Return precautions discussed and provided. Patient agreeable to plan with no unaddressed concerns; discharged in good condition.    Antony Madura, PA-C 11/06/15 0050  Raeford Razor, MD 11/10/15 [place] [facility]

## 2015-11-06 NOTE — Discharge Instructions (Signed)
Muscle Cramps and Spasms Muscle cramps and spasms occur when a muscle or muscles tighten and you have no control over this tightening (involuntary muscle contraction). They are a common problem and can develop in any muscle. The most common place is in the calf muscles of the leg. Both muscle cramps and muscle spasms are involuntary muscle contractions, but they also have differences:   Muscle cramps are sporadic and painful. They may last a few seconds to a quarter of an hour. Muscle cramps are often more forceful and last longer than muscle spasms.  Muscle spasms may or may not be painful. They may also last just a few seconds or much longer. CAUSES  It is uncommon for cramps or spasms to be due to a serious underlying problem. In many cases, the cause of cramps or spasms is unknown. Some common causes are:   Overexertion.   Overuse from repetitive motions (doing the same thing over and over).   Remaining in a certain position for a long period of time.   Improper preparation, form, or technique while performing a sport or activity.   Dehydration.   Injury.   Side effects of some medicines.   Abnormally low levels of the salts and ions in your blood (electrolytes), especially potassium and calcium. This could happen if you are taking water pills (diuretics) or you are pregnant.  Some underlying medical problems can make it more likely to develop cramps or spasms. These include, but are not limited to:   Diabetes.   Parkinson disease.   Hormone disorders, such as thyroid problems.   Alcohol abuse.   Diseases specific to muscles, joints, and bones.   Blood vessel disease where not enough blood is getting to the muscles.  HOME CARE INSTRUCTIONS   Stay well hydrated. Drink enough water and fluids to keep your urine clear or pale yellow.  It may be helpful to massage, stretch, and relax the affected muscle.  For tight or tense muscles, use a warm towel, heating  pad, or hot shower water directed to the affected area.  If you are sore or have pain after a cramp or spasm, applying ice to the affected area may relieve discomfort.  Put ice in a plastic bag.  Place a towel between your skin and the bag.  Leave the ice on for 15-20 minutes, 03-04 times a day.  Medicines used to treat a known cause of cramps or spasms may help reduce their frequency or severity. Only take over-the-counter or prescription medicines as directed by your caregiver. SEEK MEDICAL CARE IF:  Your cramps or spasms get more severe, more frequent, or do not improve over time.  MAKE SURE YOU:   Understand these instructions.  Will watch your condition.  Will get help right away if you are not doing well or get worse.   This information is not intended to replace advice given to you by your health care provider. Make sure you discuss any questions you have with your health care provider.   Document Released: 01/26/2002 Document Revised: 12/01/2012 Document Reviewed: 07/23/2012 Elsevier Interactive Patient Education 2016 [employer]. Costochondritis Costochondritis is a condition in which the tissue (cartilage) that connects your ribs with your breastbone (sternum) becomes irritated. It causes pain in the chest and rib area. It usually goes away on its own over time. HOME CARE  Avoid activities that wear you out.  Do not strain your ribs. Avoid activities that use your:  Chest.  Belly.  Side muscles.  Put ice on the area for the first 2 days after the pain starts.  Put ice in a plastic bag.  Place a towel between your skin and the bag.  Leave the ice on for 20 minutes, 2-3 times a day.  Only take medicine as told by your doctor. GET HELP IF:  You have redness or puffiness (swelling) in the rib area.  Your pain does not go away with rest or medicine. GET HELP RIGHT AWAY IF:   Your pain gets worse.  You are very uncomfortable.  You have trouble  breathing.  You cough up blood.  You start sweating or throwing up (vomiting).  You have a fever or lasting symptoms for more than 2-3 days.  You have a fever and your symptoms suddenly get worse. MAKE SURE YOU:   Understand these instructions.  Will watch your condition.  Will get help right away if you are not doing well or get worse.   This information is not intended to replace advice given to you by your health care provider. Make sure you discuss any questions you have with your health care provider.   Document Released: 01/23/2008 Document Revised: 04/08/2013 Document Reviewed: 03/10/2013 Elsevier Interactive Patient Education [employer]

## 2015-11-10 DIAGNOSIS — Z1211 Encounter for screening for malignant neoplasm of colon: Secondary | ICD-10-CM | POA: Insufficient documentation

## 2015-11-10 DIAGNOSIS — Z0000 Encounter for general adult medical examination without abnormal findings: Secondary | ICD-10-CM | POA: Insufficient documentation

## 2015-11-10 DIAGNOSIS — J4520 Mild intermittent asthma, uncomplicated: Secondary | ICD-10-CM | POA: Insufficient documentation

## 2015-11-15 ENCOUNTER — Ambulatory Visit
Admission: RE | Admit: 2015-11-15 | Discharge: 2015-11-15 | Disposition: A | Discharge status: Home / Self Care | Payer: Federal, State, Local not specified - PPO | Source: Ambulatory Visit | Attending: Neurology | Admitting: Neurology

## 2015-11-15 DIAGNOSIS — R260 Ataxic gait: Secondary | ICD-10-CM

## 2015-11-15 DIAGNOSIS — G35 Multiple sclerosis: Secondary | ICD-10-CM | POA: Diagnosis not present

## 2015-11-15 MED ORDER — GADOBENATE DIMEGLUMINE 529 MG/ML IV SOLN
15.0000 mL | Freq: Once | INTRAVENOUS | Status: AC | PRN
  Administered 2015-11-15: 15 mL via INTRAVENOUS

## 2015-11-18 ENCOUNTER — Telehealth: Payer: Self-pay | Admitting: *Deleted

## 2015-11-18 NOTE — Telephone Encounter (Signed)
-----   Message from Asa Lente, MD sent at 11/18/2015  9:00 AM EDT ----- Please let her know the MRI looks unchanged compared to prior MRI

## 2015-11-18 NOTE — Telephone Encounter (Signed)
LMOM that per RAS,  mri showed no new MS changes when compared to last mri.  She does not need to return this call unless she has questions/fim

## 2015-11-22 ENCOUNTER — Ambulatory Visit: Payer: Federal, State, Local not specified - PPO | Attending: Neurology | Admitting: Physical Therapy

## 2015-11-22 ENCOUNTER — Encounter: Payer: Self-pay | Admitting: Physical Therapy

## 2015-11-22 DIAGNOSIS — Z9181 History of falling: Secondary | ICD-10-CM | POA: Diagnosis present

## 2015-11-22 DIAGNOSIS — R2681 Unsteadiness on feet: Secondary | ICD-10-CM | POA: Diagnosis present

## 2015-11-22 DIAGNOSIS — R2689 Other abnormalities of gait and mobility: Secondary | ICD-10-CM | POA: Diagnosis not present

## 2015-11-22 DIAGNOSIS — M6281 Muscle weakness (generalized): Secondary | ICD-10-CM | POA: Insufficient documentation

## 2015-11-23 NOTE — Therapy (Signed)
[facility] [address] [place], [place], [address] Phone: [phone]   Fax:  [phone]  Physical Therapy Evaluation  Patient Details  Name: Catherine Hubbard MRN: [id number] Date of Birth: 10-30-1960 Referring Provider: Despina Arias, MD  Encounter Date: 11/22/2015      PT End of Session - 11/23/15 0806    Visit Number 1   Number of Visits 17   Authorization Type [employer] Federal   Authorization Time Period 50 visit combo limit   PT Start Time 1015   PT Stop Time 1100   PT Time Calculation (min) 45 min   Equipment Utilized During Treatment Gait belt   Activity Tolerance Patient tolerated treatment well   Behavior During Therapy WFL for tasks assessed/performed      Past Medical History  Diagnosis Date  . Multiple sclerosis (HCC)   . Vision abnormalities   . Hypertension   . Headache   . Hearing loss     Past Surgical History  Procedure Laterality Date  . Carpal tunnel release Bilateral   . Ulnar nerve repair Bilateral   . Bariatric sleeve    . Ankle fracture surgery Bilateral     There were no vitals filed for this visit.  Visit Diagnosis:  Other abnormalities of gait and mobility  Muscle weakness (generalized)  Unsteadiness on feet  History of falling     11/22/15 1025  Symptoms/Limitations  Subjective This 54yo was diagnosed with Multiple Sclerosis in 2009. She has had decline in strength & balance. She was referred to PT for evaluation.   Pertinent History HTN, carpal tunnel release, Ulnar nerve repair bilaterally, bariatric sleeve, bil. ankle fx surgery, restless leg syndrome  Limitations Standing;Walking  Patient Stated Goals to get stronger, flexibility and improve balance, walk straight  Pain Assessment  Currently in Pain? Yes  Pain Score 4 (in last week, worst 10/10, best 4-5/10)  Pain Location Back  Pain Orientation Mid;Lower (thoracic to lumbar regions)  Pain Descriptors /  Indicators Spasm;Burning;Aching  Pain Type Chronic pain  Pain Radiating Towards usually right leg if it does  Pain Onset More than a month ago  Pain Frequency Constant  Aggravating Factors  heavy lifting  Pain Relieving Factors rest & medication  Effect of Pain on Daily Activities limits standing & moving  Multiple Pain Sites Yes  2nd Pain Site  Pain Score 7 (in last week, worst 10/10, best 1/10)  Pain Location Other (Comment) (hands & feet)  Pain Orientation Left;Right  Pain Descriptors / Indicators Pins and needles;Numbness;Aching  Pain Type Chronic pain  Pain Onset More than a month ago  Pain Frequency Constant  Aggravating Factors  unknown  Pain Relieving Factors unknown     11/22/15 1015  Assessment  Medical Diagnosis MS  Referring Provider Despina Arias, MD  Onset Date/Surgical Date 10/24/15 (MD appt w/ PT need identified)  Precautions  Precautions Fall  Restrictions  Weight Bearing Restrictions No  Balance Screen  Has the patient fallen in the past 6 months Yes  How many times? 1 (multiple balance losses)  Has the patient had a decrease in activity level because of a fear of falling?  No  Is the patient reluctant to leave their home because of a fear of falling?  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  Leisure hiking & walking  Posture/Postural Control  Posture/Postural Control Postural limitations  Postural Limitations Rounded Shoulders;Forward head;Flexed trunk  ROM / Strength  AROM / PROM / Strength AROM;Strength  AROM   Overall AROM  Within functional limits for tasks performed  Strength  Overall Strength Deficits  Strength Assessment Site Hip;Knee;Ankle  Right/Left Hip Right;Left  Right/Left Knee Right;Left  Right/Left Ankle Right;Left  Right Hip Flexion 4-/5  Right Hip Extension 3-/5  Right Hip External Rotation  4/5  Right Hip Internal Rotation 4/5  Right Hip ABduction 3+/5  Left Hip Flexion 3+/5  Left Hip Extension 3-/5  Left Hip External Rotation 4-/5  Left Hip Internal Rotation 4-/5  Left Hip ABduction 3/5  Right Knee Flexion 4/5  Right Knee Extension 5/5  Left Knee Flexion 4/5  Left Knee Extension 5/5  Right Ankle Dorsiflexion 4/5  Right Ankle Plantar Flexion 3/5  Left Ankle Dorsiflexion 4/5  Left Ankle Plantar Flexion 3/5  Transfers  Transfers Sit to Stand;Stand to Sit  Sit to Stand 5: Supervision;From chair/3-in-1 (if no UE use pushes back of legs against mat or needs UE use)  Stand to Sit 5: Supervision;To chair/3-in-1 (uncontrolled descent & back of legs against chair if no UEs)  Ambulation/Gait  Ambulation/Gait Yes  Ambulation/Gait Assistance 5: Supervision  Ambulation Distance (Feet) 250 Feet  Assistive device None  Gait Pattern Step-through pattern;Decreased stride length  Ambulation Surface Indoor;Level  Gait velocity 2.83 ft/sec  Stairs Yes  Stairs Assistance 5: Supervision  Stair Management Technique One rail Right;Alternating pattern;Forwards;Step to pattern (ascend alternating, descend step-to)  Standardized Balance Assessment  Standardized Balance Assessment Berg Balance Test;TUG  Berg Balance Test  Sit to Stand 3  Standing Unsupported 4  Sitting with Back Unsupported but Feet Supported on Floor or Stool 4  Stand to Sit 3  Transfers 4  Standing Unsupported with Eyes Closed 3  Standing Ubsupported with Feet Together 3  From Standing, Reach Forward with Outstretched Arm 3  From Standing Position, Pick up Object from Floor 4  From Standing Position, Turn to  Look Behind Over each Shoulder 2  Turn 360 Degrees 2  Standing Unsupported, Alternately Place Feet on Step/Stool 2  Standing Unsupported, One Foot in Front 1  Standing on One Leg 1  Total Score 39  Timed Up and Go Test  Normal TUG (seconds) 11.4  Cognitive TUG (seconds) 12.38 (<10% increase & <15 sec indicate low fall risk)  Functional Gait  Assessment  Gait assessed  Yes  Gait Level Surface 1  Change in Gait Speed 1  Gait with Horizontal Head Turns 1  Gait with Vertical Head Turns 1  Gait and Pivot Turn 2  Step Over Obstacle 1  Gait with Narrow Base of Support 0  Gait with Eyes Closed 1  Ambulating Backwards 1  Steps 1  Total Score 10                         11/22/15 1100  Plan  Clinical Impression Statement This 55yo female with MS has history of weakness, falls and pain. She has increased weakness which has led to falls. Berg Balance 39/56 indicates fall risk. Functional Gait Assessment of 10/30 also indicates high fall risk. Patient's condition is evolving and plan  of care is moderate complexity.   Pt will benefit from skilled therapeutic intervention in order to improve on the following deficits Abnormal gait;Decreased activity tolerance;Decreased balance;Decreased mobility;Decreased strength;Impaired flexibility;Postural dysfunction  Rehab Potential Good  PT Frequency 2x / week  PT Duration Other (comment) (10 weeks)  PT Treatment/Interventions ADLs/Self Care Home Management;DME Instruction;Gait training;Stair training;Functional mobility training;Therapeutic activities;Therapeutic exercise;Balance training;Neuromuscular re-education;Patient/family education  PT Next Visit Plan HEP for core stabilization and flexibility, hip strength, balance. Give info on MS including MS society.   Consulted and Agree with Plan of Care Patient           PT Short Term Goals - 11/22/15 1100    PT SHORT TERM GOAL #1   Title Patient demonstrates understanding of  initial HEP.  (Target Date: 12/22/2015)   Time 1   Period Months   Status New   PT SHORT TERM GOAL #2   Title Berg Balance >42/56.  (Target Date: 12/22/2015)   Time 1   Period Months   Status New   PT SHORT TERM GOAL #3   Title Patient ambulates with head turns to scan environment with ability to maintain path.  (Target Date: 12/22/2015)   Time 1   Period Months   Status New   PT SHORT TERM GOAL #4   Title Patient verbalizes understanding of fall prevention strategies.  (Target Date: 12/22/2015)   Time 1   Period Months   Status New           PT Long Term Goals - 11/22/15 1100    PT LONG TERM GOAL #1   Title Patient verbalizes & demonstrates understanding of ongoing HEP / fitness plan. (Target Date: 01/27/2016)   Time 10   Period Weeks   Status New   PT LONG TERM GOAL #2   Title Berg Balance > 45/56 to indicate lower fall risk.  (Target Date: 01/27/2016)   Time 10   Period Weeks   Status New   PT LONG TERM GOAL #3   Title Functional Gait Assessment >/= 19/30 to indicate fall risk.  (Target Date: 01/27/2016)   Time 10   Period Weeks   Status New   PT LONG TERM GOAL #4   Title Patient ambulates 1000' including grass, ramps, curbs without device independently to enable community access.  (Target Date: 01/27/2016)   Time 10   Period Weeks   Status New   PT LONG TERM GOAL #5   Title Patient verbalizes energy expenditure & Multiple Sclerosis recommendations for activity tolerance.  (Target Date: 01/27/2016)   Time 10   Period Weeks   Status New                Problem List Patient Active Problem List   Diagnosis Date Noted  . Bilateral low back pain with bilateral sciatica 07/13/2015  . Right hip pain 07/13/2015  . Lumbar radicular pain 03/21/2015  . Other fatigue 01/11/2015  . Dysesthesia 01/11/2015  . Urinary urgency 01/11/2015  . Multiple sclerosis (HCC) 10/06/2014  . Numbness 10/06/2014  . Ataxic gait 10/06/2014  . High risk medication use 10/06/2014  . Gastric  bypass status for obesity 10/06/2014  . Cognitive changes 10/06/2014  . Depression with anxiety 10/06/2014  . Restless leg 10/06/2014  . Insomnia 10/06/2014  . Essential hypertension 10/06/2014  . Difficulty hearing 01/13/2014  . Other muscle spasm 01/13/2014    Marcia Lepera PT, DPT 11/23/2015, 10:30 PM  [facility] [address] [place],  [place], [address] Phone: [phone]   Fax:  [phone]  Name: Catherine Hubbard MRN: [id number] Date of Birth: 1961/06/13

## 2015-11-28 ENCOUNTER — Ambulatory Visit: Payer: Federal, State, Local not specified - PPO

## 2015-11-28 DIAGNOSIS — R2689 Other abnormalities of gait and mobility: Secondary | ICD-10-CM

## 2015-11-28 DIAGNOSIS — M6281 Muscle weakness (generalized): Secondary | ICD-10-CM

## 2015-11-28 NOTE — Patient Instructions (Addendum)
Bridge    Lie back, legs bent. Tuck in stomach and then lift hips up towards ceiling, hold for 2 seconds and then slowly lower back down. Repeat _10___ times, perform 2 sets. Do __3-4__ sessions per week. Progress to 3 sets when 2 sets become easy.  http://pm.exer.us/55   Copyright  VHI. All rights reserved.   Abduction: Clam- Side-Lying    Lie on side with knees bent. Lift top knee, keeping feet together. Keep trunk steady. Slowly lower back down. __10_ reps per set, __2_ sets per day, _3-4__ days per week. Progress to 3 sets when 2 sets becomes easy.  http://ecce.exer.us/65   Copyright  VHI. All rights reserved.   AMBULATION: Walk Backward    Walk 10 feet backward, holding counter as needed.. Take large steps, do not drag feet. __4_ reps per set, _1__ sets per day, __7_ days per week.  Copyright  VHI. All rights reserved.   Energy Conservation Techniques   Sit for as many activities as possible.  Use slow, smooth movements.  Rushing increases discomfort.  Determine the necessity of performing the task.  Simplify those tasks that are necessary.  (Get clothes out of the dryer when they are warm instead of ironing, let dishes air dry, etc.)  Take frequent rests both during and between activities.  Avoid repetitive tasks.  Pre-plan your activities; try a daily and/or weekly schedule.  Spread out the activities that are most fatiguing (break up cleaning tasks over multiple days).  Remember to plan a balance of work, rest and recreation.  Consider the best time for each activity.  Do the most exertive task when you have the most energy.  Don't carry items if you can push them.  Slide, don't lift. Push, don't pull.  Utilize two hands when appropriate.  Maintain good posture and use proper body mechanics.  Avoid remaining in one position for too long.  When lifting, bend at the knees, not at the waist.  Exhale when bending down, inhale when straightening  up.  Carry objects as close to your body and as near to the center of the pelvis.  11. Avoid wasted body movements (position yourself for the task so that you avoid bending, twisting, etc. when possible). 12. Select the best working environment.  Consider lighting, ventilation, clothing, and equipment. 13. Organize your storage areas, making the items you use daily convenient.  Store heaviest items at waist height.  Store frequently used items between shoulders and knee height.  Consider leaving frequently used       items on countertops.  (You can organize in storage baskets based on time used/purpose). 14. Feelings and emotions can be real causes of fatigue.  Try to avoid unnecessary worry, irritation, or frustration.  Avoid stress, it can also be a source of fatigue. 15. Get help from other people for difficult tasks. 16. Explore equipment or items that may be able to do the job for you with greater ease.  (Electric can openers, blenders, lightweight items for cleaning, etc.)  MS Society website: Become a member and receive newsletter. https://www.villegas.net/    Fall Prevention in the Home  Falls can cause injuries and can affect people from all age groups. There are many simple things that you can do to make your home safe and to help prevent falls. WHAT CAN I DO ON THE OUTSIDE OF MY HOME?  Regularly repair the edges of walkways and driveways and fix any cracks.  Remove high doorway thresholds.  Trim any shrubbery  on the main path into your home.  Use bright outdoor lighting.  Clear walkways of debris and clutter, including tools and rocks.  Regularly check that handrails are securely fastened and in good repair. Both sides of any steps should have handrails.  Install guardrails along the edges of any raised decks or porches.  Have leaves, snow, and ice cleared regularly.  Use sand or salt on walkways during winter months.  In the garage, clean up any spills right  away, including grease or oil spills. WHAT CAN I DO IN THE BATHROOM?  Use night lights.  Install grab bars by the toilet and in the tub and shower. Do not use towel bars as grab bars.  Use non-skid mats or decals on the floor of the tub or shower.  If you need to sit down while you are in the shower, use a plastic, non-slip stool.Marland Kitchen  Keep the floor dry. Immediately clean up any water that spills on the floor.  Remove soap buildup in the tub or shower on a regular basis.  Attach bath mats securely with double-sided non-slip rug tape.  Remove throw rugs and other tripping hazards from the floor. WHAT CAN I DO IN THE BEDROOM?  Use night lights.  Make sure that a bedside light is easy to reach.  Do not use oversized bedding that drapes onto the floor.  Have a firm chair that has side arms to use for getting dressed.  Remove throw rugs and other tripping hazards from the floor. WHAT CAN I DO IN THE KITCHEN?   Clean up any spills right away.  Avoid walking on wet floors.  Place frequently used items in easy-to-reach places.  If you need to reach for something above you, use a sturdy step stool that has a grab bar.  Keep electrical cables out of the way.  Do not use floor polish or wax that makes floors slippery. If you have to use wax, make sure that it is non-skid floor wax.  Remove throw rugs and other tripping hazards from the floor. WHAT CAN I DO IN THE STAIRWAYS?  Do not leave any items on the stairs.  Make sure that there are handrails on both sides of the stairs. Fix handrails that are broken or loose. Make sure that handrails are as long as the stairways.  Check any carpeting to make sure that it is firmly attached to the stairs. Fix any carpet that is loose or worn.  Avoid having throw rugs at the top or bottom of stairways, or secure the rugs with carpet tape to prevent them from moving.  Make sure that you have a light switch at the top of the stairs and the  bottom of the stairs. If you do not have them, have them installed. WHAT ARE SOME OTHER FALL PREVENTION TIPS?  Wear closed-toe shoes that fit well and support your feet. Wear shoes that have rubber soles or low heels.  When you use a stepladder, make sure that it is completely opened and that the sides are firmly locked. Have someone hold the ladder while you are using it. Do not climb a closed stepladder.  Add color or contrast paint or tape to grab bars and handrails in your home. Place contrasting color strips on the first and last steps.  Use mobility aids as needed, such as canes, walkers, scooters, and crutches.  Turn on lights if it is dark. Replace any light bulbs that burn out.  Set up furniture so  that there are clear paths. Keep the furniture in the same spot.  Fix any uneven floor surfaces.  Choose a carpet design that does not hide the edge of steps of a stairway.  Be aware of any and all pets.  Review your medicines with your healthcare provider. Some medicines can cause dizziness or changes in blood pressure, which increase your risk of falling. Talk with your health care provider about other ways that you can decrease your risk of falls. This may include working with a physical therapist or trainer to improve your strength, balance, and endurance.   This information is not intended to replace advice given to you by your health care provider. Make sure you discuss any questions you have with your health care provider.   Document Released: 07/27/2002 Document Revised: 12/21/2014 Document Reviewed: 09/10/2014 Elsevier Interactive Patient Education [employer]

## 2015-11-28 NOTE — Therapy (Addendum)
[facility] [address] [place], [place], [address] Phone: [phone]   Fax:  [phone]  Physical Therapy Treatment  Patient Details  Name: Catherine Hubbard MRN: [id number] Date of Birth: January 23, 1961 Referring Provider: Despina Arias, MD  Encounter Date: 11/28/2015      PT End of Session - 11/28/15 1147    Visit Number 2   Number of Visits 17   Date for PT Re-Evaluation 01/31/16   Authorization Type [employer] Federal   Authorization Time Period 50 visit combo limit   PT Start Time 1101   PT Stop Time 1142   PT Time Calculation (min) 41 min   Activity Tolerance Patient tolerated treatment well   Behavior During Therapy [facility] for tasks assessed/performed      Past Medical History  Diagnosis Date  . Multiple sclerosis (HCC)   . Vision abnormalities   . Hypertension   . Headache   . Hearing loss     Past Surgical History  Procedure Laterality Date  . Carpal tunnel release Bilateral   . Ulnar nerve repair Bilateral   . Bariatric sleeve    . Ankle fracture surgery Bilateral     There were no vitals filed for this visit.      Subjective Assessment - 11/28/15 1104    Subjective Pt denied falls or changes since last visit.    Pertinent History HTN, carpal tunnel release, Ulnar nerve repair bilaterally, bariatric sleeve, bil. ankle fx surgery, restless leg syndrome   Patient Stated Goals to get stronger, flexibility and improve balance, walk straight   Currently in Pain? Yes   Pain Score 5    Pain Location Leg   Pain Orientation Right;Left  (R>L)   Pain Descriptors / Indicators Aching;Burning   Pain Type Chronic pain   Pain Onset More than a month ago   Pain Frequency Constant   Aggravating Factors  walking   Pain Relieving Factors rest and medication          Neuro re-ed: Amb. Backwards at counter with cues for technique. Please see pt instructions for details.  Therex: Pt performed  strengthening HEP and standing B hip abd. 2x10sets. Cues for technique. Please see pt instructions for details.                  Self care:     PT Education - 11/28/15 1140    Education provided Yes   Education Details PT initiated strengthening/balance HEP. PT educated pt on falls prevention handout, energy conservation handout, and MS society (including website). PT educated pt on using stepper at gym for now vs. treadmill, as pt reports her foot catches on treadmill. Pt stated she uses heat for pain, and PT educated pt that with MS, heat could incr. Sx's. PT recommended ice after HEP to reduce soreness, for no greater then 10 minutes, with layers between skin and ice.   Person(s) Educated Patient   Methods Explanation;Demonstration;Tactile cues;Verbal cues;Handout   Comprehension Returned demonstration;Verbalized understanding          PT Short Term Goals - 11/28/15 1152    PT SHORT TERM GOAL #1   Title Patient demonstrates understanding of initial HEP.  (Target Date: 12/22/2015)   Time 1   Period Months   Status On-going   PT SHORT TERM GOAL #2   Title Programmer, systems >42/56.  (Target Date: 12/22/2015)   Time 1   Period Months   Status On-going   PT SHORT TERM GOAL #  3   Title Patient ambulates with head turns to scan environment with ability to maintain path.  (Target Date: 12/22/2015)   Time 1   Period Months   Status On-going   PT SHORT TERM GOAL #4   Title Patient verbalizes understanding of fall prevention strategies.  (Target Date: 12/22/2015)   Time 1   Period Months   Status On-going           PT Long Term Goals - 11/28/15 1153    PT LONG TERM GOAL #1   Title Patient verbalizes & demonstrates understanding of ongoing HEP / fitness plan. (Target Date: 01/27/2016)   Time 10   Period Weeks   Status On-going   PT LONG TERM GOAL #2   Title Berg Balance > 45/56 to indicate lower fall risk.  (Target Date: 01/27/2016)   Time 10   Period Weeks   Status On-going    PT LONG TERM GOAL #3   Title Functional Gait Assessment >/= 19/30 to indicate fall risk.  (Target Date: 01/27/2016)   Time 10   Period Weeks   Status On-going   PT LONG TERM GOAL #4   Title Patient ambulates 1000' including grass, ramps, curbs without device independently to enable community access.  (Target Date: 01/27/2016)   Time 10   Period Weeks   Status On-going   PT LONG TERM GOAL #5   Title Patient verbalizes energy expenditure & Multiple Sclerosis recommendations for activity tolerance.  (Target Date: 01/27/2016)   Time 10   Period Weeks   Status On-going               Plan - 11/28/15 1148    Clinical Impression Statement Pt tolerated HEP well, but did require rest breaks in between sets to ensure pt did not fatigue 2/2 MS. PT will assess walking endurance next session, to establish walking program. PT had pt perform 2 sets of strengthening HEP vs. 3 sets, as research states 2 sets is 83% as effective when compared to 3 sets, and pt does fatigue easily 2/2 MS. continue with POC.    Rehab Potential Good   PT Frequency 2x / week   PT Duration Other (comment)  10 weeks   PT Treatment/Interventions ADLs/Self Care Home Management;DME Instruction;Gait training;Stair training;Functional mobility training;Therapeutic activities;Therapeutic exercise;Balance training;Neuromuscular re-education;Patient/family education   PT Next Visit Plan Add core stab. and flexibility exercises to HEP. Add to balance HEP. Establish walking program.   PT Home Exercise Plan Strengthening and balance HEP   Consulted and Agree with Plan of Care Patient      Patient will benefit from skilled therapeutic intervention in order to improve the following deficits and impairments:  Abnormal gait, Decreased activity tolerance, Decreased balance, Decreased mobility, Decreased strength, Impaired flexibility, Postural dysfunction  Visit Diagnosis: Muscle weakness (generalized)  Other abnormalities of gait  and mobility     Problem List Patient Active Problem List   Diagnosis Date Noted  . Bilateral low back pain with bilateral sciatica 07/13/2015  . Right hip pain 07/13/2015  . Lumbar radicular pain 03/21/2015  . Other fatigue 01/11/2015  . Dysesthesia 01/11/2015  . Urinary urgency 01/11/2015  . Multiple sclerosis (HCC) 10/06/2014  . Numbness 10/06/2014  . Ataxic gait 10/06/2014  . High risk medication use 10/06/2014  . Gastric bypass status for obesity 10/06/2014  . Cognitive changes 10/06/2014  . Depression with anxiety 10/06/2014  . Restless leg 10/06/2014  . Insomnia 10/06/2014  . Essential hypertension 10/06/2014  . Difficulty  hearing 01/13/2014  . Other muscle spasm 01/13/2014    Oral Hallgren L 11/28/2015, 11:54 AM  [facility] [address] [place], [place], [address] Phone: [phone]   Fax:  [phone]  Name: Catherine Hubbard MRN: [id number] Date of Birth: 28-Feb-1961    Zerita Boers, PT,DPT 11/28/2015 11:54 AM Phone: [phone] Fax: [phone]

## 2015-11-29 ENCOUNTER — Ambulatory Visit: Payer: Federal, State, Local not specified - PPO

## 2015-12-05 ENCOUNTER — Ambulatory Visit: Payer: Federal, State, Local not specified - PPO | Admitting: Physical Therapy

## 2015-12-05 DIAGNOSIS — R2689 Other abnormalities of gait and mobility: Secondary | ICD-10-CM | POA: Diagnosis not present

## 2015-12-05 NOTE — Therapy (Signed)
[facility] [address] [place], [place], [address] Phone: [phone]   Fax:  [phone]  Physical Therapy Treatment  Patient Details  Name: Catherine Hubbard MRN: [id number] Date of Birth: 22-Apr-1961 Referring Provider: Despina Arias, MD  Encounter Date: 12/05/2015    Past Medical History  Diagnosis Date  . Multiple sclerosis (HCC)   . Vision abnormalities   . Hypertension   . Headache   . Hearing loss     Past Surgical History  Procedure Laterality Date  . Carpal tunnel release Bilateral   . Ulnar nerve repair Bilateral   . Bariatric sleeve    . Ankle fracture surgery Bilateral     There were no vitals filed for this visit.      Subjective Assessment - 12/05/15 1024    Subjective Pr requested going down to 1 visit per week due to finances.  " I know the exercises are working." Able to get up from bed and chairs easier. Started to walk 2x last week.   Currently in Pain? Yes   Pain Score 5    Pain Location Back   Pain Orientation Lower   Pain Descriptors / Indicators Aching;Burning   Pain Type Chronic pain   Pain Radiating Towards Down LEG   Pain Onset More than a month ago   Pain Frequency Constant                         [facility] Adult PT Treatment/Exercise - 12/05/15 0001    Ambulation/Gait   Ambulation/Gait Yes   Ambulation/Gait Assistance 7: Independent   Ambulation Distance (Feet) 600 Feet   Assistive device None   Gait Pattern Step-through pattern  no toe drag, steady   Ambulation Surface Level;Indoor   Gait Comments  walking test= 133.5 meters     Knee/Hip Exercises: Aerobic   Elliptical L1.0 5 min   very challanging for pt; stopped and rested when reported activity level as "hard".   Ankle Exercises: Seated   Heel Raises 20 reps;3 seconds   Toe Raise 20 reps;3 seconds   Ankle Exercises: Standing   Heel Raises 20 reps;3 seconds   Toe Raise 20 reps;3 seconds                 PT Education - 12/05/15 1128    Education provided Yes   Education Details Initiated Walking Program   Person(s) Educated Patient   Methods Explanation;Handout   Comprehension Verbalized understanding;Need further instruction          PT Short Term Goals - 11/28/15 1152    PT SHORT TERM GOAL #1   Title Patient demonstrates understanding of initial HEP.  (Target Date: 12/22/2015)   Time 1   Period Months   Status On-going   PT SHORT TERM GOAL #2   Title Programmer, systems >42/56.  (Target Date: 12/22/2015)   Time 1   Period Months   Status On-going   PT SHORT TERM GOAL #3   Title Patient ambulates with head turns to scan environment with ability to maintain path.  (Target Date: 12/22/2015)   Time 1   Period Months   Status On-going   PT SHORT TERM GOAL #4   Title Patient verbalizes understanding of fall prevention strategies.  (Target Date: 12/22/2015)   Time 1   Period Months   Status On-going           PT Long Term Goals - 11/28/15 1153  PT LONG TERM GOAL #1   Title Patient verbalizes & demonstrates understanding of ongoing HEP / fitness plan. (Target Date: 01/27/2016)   Time 10   Period Weeks   Status On-going   PT LONG TERM GOAL #2   Title Berg Balance > 45/56 to indicate lower fall risk.  (Target Date: 01/27/2016)   Time 10   Period Weeks   Status On-going   PT LONG TERM GOAL #3   Title Functional Gait Assessment >/= 19/30 to indicate fall risk.  (Target Date: 01/27/2016)   Time 10   Period Weeks   Status On-going   PT LONG TERM GOAL #4   Title Patient ambulates 1000' including grass, ramps, curbs without device independently to enable community access.  (Target Date: 01/27/2016)   Time 10   Period Weeks   Status On-going   PT LONG TERM GOAL #5   Title Patient verbalizes energy expenditure & Multiple Sclerosis recommendations for activity tolerance.  (Target Date: 01/27/2016)   Time 10   Period Weeks   Status On-going                Plan - 12/05/15 1103    Clinical Impression Statement Pt verbalized  understanding of HEP given 11/23/15 and reports compliance.  Initiated Walking Program- pt had started to walk 2x last week.  Pt feels encouraged with progress to functional activity tolerance already made.   Rehab Potential Good   PT Frequency 2x / week   PT Duration Other (comment)  10 weeks   PT Treatment/Interventions ADLs/Self Care Home Management;DME Instruction;Gait training;Stair training;Functional mobility training;Therapeutic activities;Therapeutic exercise;Balance training;Neuromuscular re-education;Patient/family education   PT Next Visit Plan Add core stab. and flexibility exercises to HEP. Add to balance HEP. Review walking HEP; balance on negotiating inclines.   PT Home Exercise Plan Strengthening and balance HEP   Consulted and Agree with Plan of Care Patient      Patient will benefit from skilled therapeutic intervention in order to improve the following deficits and impairments:  Abnormal gait, Decreased activity tolerance, Decreased balance, Decreased mobility, Decreased strength, Impaired flexibility, Postural dysfunction  Visit Diagnosis: Other abnormalities of gait and mobility     Problem List Patient Active Problem List   Diagnosis Date Noted  . Bilateral low back pain with bilateral sciatica 07/13/2015  . Right hip pain 07/13/2015  . Lumbar radicular pain 03/21/2015  . Other fatigue 01/11/2015  . Dysesthesia 01/11/2015  . Urinary urgency 01/11/2015  . Multiple sclerosis (HCC) 10/06/2014  . Numbness 10/06/2014  . Ataxic gait 10/06/2014  . High risk medication use 10/06/2014  . Gastric bypass status for obesity 10/06/2014  . Cognitive changes 10/06/2014  . Depression with anxiety 10/06/2014  . Restless leg 10/06/2014  . Insomnia 10/06/2014  . Essential hypertension 10/06/2014  . Difficulty hearing 01/13/2014  . Other muscle spasm 01/13/2014    Hortencia Conradi, PTA  12/05/2015, 12:16  PM [facility] [address] [place], [place], [address] Phone: [phone]   Fax:  [phone]  Name: Catherine Hubbard MRN: [id number] Date of Birth: Mar 13, 1961

## 2015-12-05 NOTE — Patient Instructions (Signed)
Walking Program:  Begin walking for exercise for *20** minutes, *1** times/day, **3* days/week.   Progress your walking program by adding *1-2** minutes to your routine each week, as tolerated. Be sure to wear good walking shoes, walk in a safe environment and only progress to your tolerance.      Speed should challenging but not hard Perform heel-toe raises seated and standing 10-20x with 3 second holds.

## 2015-12-07 ENCOUNTER — Ambulatory Visit: Payer: Federal, State, Local not specified - PPO | Admitting: Physical Therapy

## 2015-12-14 ENCOUNTER — Ambulatory Visit: Payer: Federal, State, Local not specified - PPO | Admitting: Physical Therapy

## 2015-12-15 ENCOUNTER — Ambulatory Visit: Payer: Federal, State, Local not specified - PPO | Admitting: Physical Therapy

## 2015-12-19 ENCOUNTER — Encounter: Payer: Self-pay | Admitting: Physical Therapy

## 2015-12-19 ENCOUNTER — Ambulatory Visit: Payer: Federal, State, Local not specified - PPO | Attending: Neurology | Admitting: Physical Therapy

## 2015-12-19 DIAGNOSIS — Z9181 History of falling: Secondary | ICD-10-CM

## 2015-12-19 DIAGNOSIS — M6281 Muscle weakness (generalized): Secondary | ICD-10-CM | POA: Diagnosis present

## 2015-12-19 DIAGNOSIS — R2681 Unsteadiness on feet: Secondary | ICD-10-CM | POA: Diagnosis present

## 2015-12-19 DIAGNOSIS — R2689 Other abnormalities of gait and mobility: Secondary | ICD-10-CM

## 2015-12-19 NOTE — Therapy (Signed)
[facility] [address] [place], [place], [address] Phone: [phone]   Fax:  [phone]  Physical Therapy Treatment  Patient Details  Name: Catherine Hubbard MRN: [id number] Date of Birth: July 15, 1961 Referring Provider: Despina Arias, MD  Encounter Date: 12/19/2015      PT End of Session - 12/19/15 1158    Visit Number 4   Number of Visits 17   Date for PT Re-Evaluation 01/31/16   Authorization Type [employer] Federal   Authorization Time Period 50 visit combo limit   PT Start Time 1021   PT Stop Time 1100   PT Time Calculation (min) 39 min   Activity Tolerance Patient tolerated treatment well   Behavior During Therapy [facility] for tasks assessed/performed      Past Medical History  Diagnosis Date  . Multiple sclerosis (HCC)   . Vision abnormalities   . Hypertension   . Headache   . Hearing loss     Past Surgical History  Procedure Laterality Date  . Carpal tunnel release Bilateral   . Ulnar nerve repair Bilateral   . Bariatric sleeve    . Ankle fracture surgery Bilateral     There were no vitals filed for this visit.      Subjective Assessment - 12/19/15 1026    Subjective Patient fell 6 days ago. She went to Urgent Care and reports she has hairline fracture of distal humerus.    Pertinent History HTN, carpal tunnel release, Ulnar nerve repair bilaterally, bariatric sleeve, bil. ankle fx surgery, restless leg syndrome   Limitations Standing;Walking   Patient Stated Goals to get stronger, flexibility and improve balance, walk straight   Currently in Pain? Yes   Pain Score 4    Pain Location Back   Pain Orientation Mid   Pain Descriptors / Indicators Burning   Pain Type Chronic pain   Pain Onset More than a month ago   Pain Frequency Constant   Aggravating Factors  walking   Pain Relieving Factors     Multiple Pain Sites Yes   Pain Score 8   Pain Location Hip   Pain Orientation Right   Pain  Descriptors / Indicators Stabbing   Pain Type Acute pain   Pain Onset In the past 7 days   Pain Frequency Intermittent   Aggravating Factors  fall    Pain Relieving Factors rest     Therapeutic Exercise: PT instructed in components of fitness program: endurance, strength, flexibility & balance PT instructed to modify reps depending on energy level & activity level planned for day with MS. Pt verbalized understanding. Back on Track program: Posterior pelvic tilt (Ppt) 10 reps Single knee to chest 2 reps each LE Double knee to chest 2 reps Trunk rotation 2 reps each Bridging 10 reps Abdominal crunch 10 reps Ppt with hooklying rotation 10 reps Ppt with heel slides 10 reps Ppt with marching / hip flexion 10 reps Prone on elbows upper back push-up 10 reps Seated hamstring stretch with sheet 2 reps each  Pelvic rocking seated 10 reps Squats as if "Dirty Toilet" 10 reps Back bends 10 reps Standing calf stretch 2 reps each leg                             PT Short Term Goals - 11/28/15 1152    PT SHORT TERM GOAL #1   Title Patient demonstrates understanding of initial HEP.  (Target Date:  12/22/2015)   Time 1   Period Months   Status On-going   PT SHORT TERM GOAL #2   Title Berg Balance >42/56.  (Target Date: 12/22/2015)   Time 1   Period Months   Status On-going   PT SHORT TERM GOAL #3   Title Patient ambulates with head turns to scan environment with ability to maintain path.  (Target Date: 12/22/2015)   Time 1   Period Months   Status On-going   PT SHORT TERM GOAL #4   Title Patient verbalizes understanding of fall prevention strategies.  (Target Date: 12/22/2015)   Time 1   Period Months   Status On-going           PT Long Term Goals - 11/28/15 1153    PT LONG TERM GOAL #1   Title Patient verbalizes & demonstrates understanding of ongoing HEP / fitness plan. (Target Date: 01/27/2016)   Time 10   Period Weeks   Status On-going   PT LONG TERM GOAL  #2   Title Berg Balance > 45/56 to indicate lower fall risk.  (Target Date: 01/27/2016)   Time 10   Period Weeks   Status On-going   PT LONG TERM GOAL #3   Title Functional Gait Assessment >/= 19/30 to indicate fall risk.  (Target Date: 01/27/2016)   Time 10   Period Weeks   Status On-going   PT LONG TERM GOAL #4   Title Patient ambulates 1000' including grass, ramps, curbs without device independently to enable community access.  (Target Date: 01/27/2016)   Time 10   Period Weeks   Status On-going   PT LONG TERM GOAL #5   Title Patient verbalizes energy expenditure & Multiple Sclerosis recommendations for activity tolerance.  (Target Date: 01/27/2016)   Time 10   Period Weeks   Status On-going               Plan - 12/19/15 1200    Clinical Impression Statement Patient seems to understand Back On Track HEP. She verbalizes understanding of adjusting exercises & activities with energy conservation with MS.   Rehab Potential Good   PT Frequency 2x / week   PT Duration Other (comment)  10 weeks   PT Treatment/Interventions ADLs/Self Care Home Management;DME Instruction;Gait training;Stair training;Functional mobility training;Therapeutic activities;Therapeutic exercise;Balance training;Neuromuscular re-education;Patient/family education   PT Next Visit Plan check current HEP. Add to balance HEP. Review walking HEP; balance on negotiating inclines.   PT Home Exercise Plan Strengthening and balance HEP   Consulted and Agree with Plan of Care Patient      Patient will benefit from skilled therapeutic intervention in order to improve the following deficits and impairments:  Abnormal gait, Decreased activity tolerance, Decreased balance, Decreased mobility, Decreased strength, Impaired flexibility, Postural dysfunction  Visit Diagnosis: Other abnormalities of gait and mobility  Muscle weakness (generalized)  Unsteadiness on feet  History of falling     Problem List Patient  Active Problem List   Diagnosis Date Noted  . Bilateral low back pain with bilateral sciatica 07/13/2015  . Right hip pain 07/13/2015  . Lumbar radicular pain 03/21/2015  . Other fatigue 01/11/2015  . Dysesthesia 01/11/2015  . Urinary urgency 01/11/2015  . Multiple sclerosis (HCC) 10/06/2014  . Numbness 10/06/2014  . Ataxic gait 10/06/2014  . High risk medication use 10/06/2014  . Gastric bypass status for obesity 10/06/2014  . Cognitive changes 10/06/2014  . Depression with anxiety 10/06/2014  . Restless leg 10/06/2014  . Insomnia 10/06/2014  .  Essential hypertension 10/06/2014  . Difficulty hearing 01/13/2014  . Other muscle spasm 01/13/2014    Korban Shearer PT, DPT 12/19/2015, 12:03 PM  [facility] [address] [place], [place], [address] Phone: [phone]   Fax:  [phone]  Name: Catherine Hubbard MRN: [id number] Date of Birth: 11-18-1960

## 2015-12-21 ENCOUNTER — Ambulatory Visit: Payer: Federal, State, Local not specified - PPO | Admitting: Physical Therapy

## 2015-12-26 ENCOUNTER — Ambulatory Visit: Payer: Federal, State, Local not specified - PPO | Admitting: Physical Therapy

## 2015-12-28 ENCOUNTER — Ambulatory Visit: Payer: Federal, State, Local not specified - PPO | Admitting: Physical Therapy

## 2016-01-02 ENCOUNTER — Ambulatory Visit: Payer: Federal, State, Local not specified - PPO | Admitting: Physical Therapy

## 2016-01-04 ENCOUNTER — Ambulatory Visit: Payer: Federal, State, Local not specified - PPO | Admitting: Physical Therapy

## 2016-01-09 ENCOUNTER — Ambulatory Visit: Payer: Federal, State, Local not specified - PPO | Admitting: Physical Therapy

## 2016-01-11 ENCOUNTER — Ambulatory Visit: Payer: Federal, State, Local not specified - PPO | Admitting: Physical Therapy

## 2016-01-17 ENCOUNTER — Ambulatory Visit: Payer: Federal, State, Local not specified - PPO | Admitting: Physical Therapy

## 2016-01-19 ENCOUNTER — Ambulatory Visit: Payer: Federal, State, Local not specified - PPO | Admitting: Physical Therapy

## 2016-01-23 ENCOUNTER — Ambulatory Visit: Payer: Federal, State, Local not specified - PPO | Admitting: Physical Therapy

## 2016-01-25 ENCOUNTER — Telehealth: Payer: Self-pay | Admitting: Neurology

## 2016-01-25 ENCOUNTER — Ambulatory Visit: Payer: Federal, State, Local not specified - PPO | Admitting: Physical Therapy

## 2016-01-25 NOTE — Telephone Encounter (Signed)
LMTC.  Need more details regarding her pain. If it is not MS related, she should f/u with her pcp for eval of rib pain/fim

## 2016-01-25 NOTE — Telephone Encounter (Signed)
Pt called requesting appt to be seen for pain in the ribs, starting on the rt side and is now on the left side. She said it is severe pain. Appt has been scheduled for 6/8 @ 3pm.

## 2016-01-25 NOTE — Telephone Encounter (Signed)
I have spoken with Catherine Hubbard this afternoon.  She c/o intermittent sharp bilat rib pain, positional, some relief with ice. No resp. distress.   Hx. of rib fx's. No new injury that she is aware of, but sts. she was told by the ER that there is damage to the "ligaments that attach the ribs to the sternum."  Advised she f/u with her pcp or ortho, as they would be in a better position to manage rib pain.  She is agreeable.  Appt. with RAS tomorrow cancelled/fim

## 2016-01-26 ENCOUNTER — Ambulatory Visit: Payer: Federal, State, Local not specified - PPO | Admitting: Neurology

## 2016-02-07 ENCOUNTER — Other Ambulatory Visit: Payer: Self-pay

## 2016-02-07 ENCOUNTER — Emergency Department (HOSPITAL_COMMUNITY): Payer: Federal, State, Local not specified - PPO

## 2016-02-07 ENCOUNTER — Encounter (HOSPITAL_COMMUNITY): Payer: Self-pay | Admitting: Emergency Medicine

## 2016-02-07 DIAGNOSIS — M62838 Other muscle spasm: Secondary | ICD-10-CM | POA: Insufficient documentation

## 2016-02-07 DIAGNOSIS — Z87891 Personal history of nicotine dependence: Secondary | ICD-10-CM | POA: Insufficient documentation

## 2016-02-07 DIAGNOSIS — M79621 Pain in right upper arm: Secondary | ICD-10-CM | POA: Diagnosis not present

## 2016-02-07 DIAGNOSIS — I10 Essential (primary) hypertension: Secondary | ICD-10-CM | POA: Insufficient documentation

## 2016-02-07 DIAGNOSIS — R079 Chest pain, unspecified: Secondary | ICD-10-CM | POA: Diagnosis present

## 2016-02-07 LAB — BASIC METABOLIC PANEL
Anion gap: 11 (ref 5–15)
BUN: <5 mg/dL — ABNORMAL LOW (ref 6–20)
CALCIUM: 9.6 mg/dL (ref 8.9–10.3)
CO2: 26 mmol/L (ref 22–32)
CREATININE: 0.48 mg/dL (ref 0.44–1.00)
Chloride: 99 mmol/L — ABNORMAL LOW (ref 101–111)
GFR calc Af Amer: >60 mL/min (ref >60)
GFR calc non Af Amer: >60 mL/min (ref >60)
GLUCOSE: 93 mg/dL (ref 65–99)
Potassium: 3.6 mmol/L (ref 3.5–5.1)
Sodium: 136 mmol/L (ref 135–145)

## 2016-02-07 LAB — CBC
HCT: 36.1 % (ref 36.0–46.0)
Hemoglobin: 12.3 g/dL (ref 12.0–15.0)
MCH: 31.8 pg (ref 26.0–34.0)
MCHC: 34.1 g/dL (ref 30.0–36.0)
MCV: 93.3 fL (ref 78.0–100.0)
PLATELETS: 267 10*3/uL (ref 150–400)
RBC: 3.87 MIL/uL (ref 3.87–5.11)
RDW: 12.3 % (ref 11.5–15.5)
WBC: 6.3 10*3/uL (ref 4.0–10.5)

## 2016-02-07 LAB — I-STAT TROPONIN, ED: TROPONIN I, POC: 0.02 ng/mL (ref 0.00–0.08)

## 2016-02-07 NOTE — ED Notes (Signed)
Pt states CP on R side of her chest that travels down R side of rib to her back. Pt states it also travels down her right arm. Denies vomiting.

## 2016-02-08 ENCOUNTER — Emergency Department (HOSPITAL_COMMUNITY)
Admission: EM | Admit: 2016-02-08 | Discharge: 2016-02-08 | Disposition: A | Discharge status: Home / Self Care | Payer: Federal, State, Local not specified - PPO | Attending: Emergency Medicine | Admitting: Emergency Medicine

## 2016-02-08 DIAGNOSIS — M62838 Other muscle spasm: Secondary | ICD-10-CM

## 2016-02-08 MED ORDER — ACETAMINOPHEN 325 MG PO TABS
650.0000 mg | ORAL_TABLET | Freq: Once | ORAL | Status: AC
  Administered 2016-02-08: 650 mg via ORAL
  Filled 2016-02-08: qty 2

## 2016-02-08 MED ORDER — DIAZEPAM 5 MG PO TABS: 5.0000 mg | ORAL_TABLET | Freq: Two times a day (BID) | ORAL | Status: DC | PRN

## 2016-02-08 MED ORDER — IBUPROFEN 200 MG PO TABS
400.0000 mg | ORAL_TABLET | Freq: Once | ORAL | Status: DC
  Filled 2016-02-08: qty 2

## 2016-02-08 MED ORDER — DIAZEPAM 5 MG PO TABS
5.0000 mg | ORAL_TABLET | Freq: Once | ORAL | Status: AC
  Administered 2016-02-08: 5 mg via ORAL
  Filled 2016-02-08: qty 1

## 2016-02-08 NOTE — ED Provider Notes (Signed)
CSN: [id number]     Arrival date & time 02/07/16  2219 History  By signing my name below, I, Arianna Nassar, attest that this documentation has been prepared under the direction and in the presence of Shon Baton, MD.  Electronically Signed: Octavia Heir, ED Scribe. 02/08/2016. 1:32 AM.    Chief Complaint  Patient presents with  . Chest Pain      The history is provided by the patient. No language interpreter was used.   HPI Comments: Catherine Hubbard is a 55 y.o. female who has a PMhx of multiple sclerosis, HTN, headache and hearing loss presents to the Emergency Department complaining of waxing and waning, gradual worsening, 5-6/10 right scapula pain onset last night. She notes associated right sided chest pain below the breast. Pt says she has had similar pain in the past. Pt says she typically gets chest pain when she has back spasm episodes. Pt says the area are tender when touched. There are no aggravating or modifying factors. Pt has not taken any medication to alleviate her pain. She denies rash.  The patient was seen and evaluated in March with similar complaints. She improved with Valium and muscle relaxant.  Past Medical History  Diagnosis Date  . Multiple sclerosis (HCC)   . Vision abnormalities   . Hypertension   . Headache   . Hearing loss    Past Surgical History  Procedure Laterality Date  . Carpal tunnel release Bilateral   . Ulnar nerve repair Bilateral   . Bariatric sleeve    . Ankle fracture surgery Bilateral    Family History  Problem Relation Age of Onset  . Cancer Mother   . Stroke Mother   . Cancer Father    Social History  Substance Use Topics  . Smoking status: Former Games developer  . Smokeless tobacco: Never Used  . Alcohol Use: 0.0 oz/week    0 Standard drinks or equivalent per week     Comment: Sts. she drinks beer, liquor daily, varying amts/fim   OB History    No data available     Review of Systems  Constitutional: Negative for  fever.  Respiratory: Negative for shortness of breath.   Cardiovascular: Positive for chest pain (right sided).  Musculoskeletal: Positive for arthralgias.  Skin: Negative for rash.  All other systems reviewed and are negative.     Allergies  Percocet  Home Medications   Prior to Admission medications   Medication Sig Start Date End Date Taking? Authorizing Provider  buPROPion (WELLBUTRIN XL) 300 MG 24 hr tablet TAKE 1 TABLET (300 MG TOTAL) BY MOUTH DAILY. 10/24/15  Yes Asa Lente, MD  FLUoxetine (PROZAC) 20 MG capsule Take 1 capsule (20 mg total) by mouth daily. 10/24/15  Yes Asa Lente, MD  valsartan (DIOVAN) 80 MG tablet Take 80 mg by mouth daily.   Yes Historical Provider, MD  cyclobenzaprine (FLEXERIL) 5 MG tablet Take 1 tablet (5 mg total) by mouth every 8 (eight) hours as needed for muscle spasms. Patient not taking: Reported on 02/08/2016 10/24/15   Asa Lente, MD  diazepam (VALIUM) 5 MG tablet Take 1 tablet (5 mg total) by mouth every 12 (twelve) hours as needed for muscle spasms. 02/08/16   Shon Baton, MD  HYDROcodone-acetaminophen (NORCO/VICODIN) 5-325 MG tablet Take 1-2 tablets by mouth daily as needed. Patient not taking: Reported on 02/08/2016 10/24/15   Asa Lente, MD  modafinil (PROVIGIL) 200 MG tablet Take 1 tablet in the morning  and 1 tablet at noon. Patient not taking: Reported on 02/08/2016 04/06/15   Asa Lente, MD   Triage vitals: BP 118/90 mmHg  Pulse 88  Temp(Src) 98.5 F (36.9 C) (Oral)  Resp 18  SpO2 99% Physical Exam  Constitutional: She is oriented to person, place, and time. She appears well-developed and well-nourished. No distress.  HENT:  Head: Normocephalic and atraumatic.  Cardiovascular: Normal rate, regular rhythm and normal heart sounds.   Pulmonary/Chest: Effort normal. No respiratory distress. She has no wheezes. She exhibits tenderness.  Abdominal: Soft. Bowel sounds are normal. There is no tenderness. There is no  rebound.  Musculoskeletal:  Tenderness palpation right scapula, no overlying skin changes  Neurological: She is alert and oriented to person, place, and time.  Skin: Skin is warm and dry.  Psychiatric: She has a normal mood and affect.  Nursing note and vitals reviewed.   ED Course  Procedures  DIAGNOSTIC STUDIES: Oxygen Saturation is 99% on RA, normal by my interpretation.  COORDINATION OF CARE:  1:31 AM Discussed treatment plan  with pt at bedside and pt agreed to plan.  Labs Review Labs Reviewed  BASIC METABOLIC PANEL - Abnormal; Notable for the following:    Chloride 99 (*)    BUN <5 (*)    All other components within normal limits  CBC  I-STAT TROPOININ, ED    Imaging Review Dg Chest 2 View  02/07/2016  CLINICAL DATA:  55 year old female with chest pain EXAM: CHEST  2 VIEW COMPARISON:  Chest radiograph dated 11/05/2015 FINDINGS: Two views of chest demonstrates emphysematous changes of the lungs. There is no focal consolidation, pleural effusion, or pneumothorax. The cardiac silhouette is within normal limits. Stable old healed fracture of the lateral aspect of the right eighth rib. No acute fracture. IMPRESSION: No active cardiopulmonary disease. Electronically Signed   By: Elgie Collard M.D.   On: 02/07/2016 22:58   I have personally reviewed and evaluated these images and lab results as part of my medical decision-making.   EKG Interpretation   Date/Time:  Tuesday February 07 2016 22:23:46 EDT Ventricular Rate:  88 PR Interval:  154 QRS Duration: 94 QT Interval:  410 QTC Calculation: 496 R Axis:   33 Text Interpretation:  Normal sinus rhythm Prolonged QT Abnormal ECG  Confirmed by HORTON  MD, COURTNEY ([id number]) on 02/08/2016 1:12:37 AM      MDM   Final diagnoses:  Muscle spasm    Patient presents with pain over the right scapula and right chest. It is reproducible on exam. Low risk ACS.  EKG is reassuring. Basic lab work including troponin is negative. Patient  was given Valium and Tylenol with improvement of her symptoms. Will discharge home with a short course of Valium for muscle relaxation. Follow-up with primary physician.  After history, exam, and medical workup I feel the patient has been appropriately medically screened and is safe for discharge home. Pertinent diagnoses were discussed with the patient. Patient was given return precautions.  I personally performed the services described in this documentation, which was scribed in my presence. The recorded information has been reviewed and is accurate.   Shon Baton, MD 02/08/16 [phone]

## 2016-02-08 NOTE — Discharge Instructions (Signed)

## 2016-02-09 ENCOUNTER — Telehealth: Payer: Self-pay | Admitting: Neurology

## 2016-02-09 MED ORDER — MODAFINIL 200 MG PO TABS: ORAL_TABLET | ORAL | Status: DC

## 2016-02-09 NOTE — Telephone Encounter (Signed)
Modafinil rx. faxed to [facility]/fim

## 2016-02-09 NOTE — Telephone Encounter (Signed)
Rx. awaiting RAS sig/fim 

## 2016-02-09 NOTE — Telephone Encounter (Signed)
Patient called to request renewal/refill of modafinil (PROVIGIL) 200 MG tablet, current Rx has expired.

## 2016-02-13 NOTE — Telephone Encounter (Signed)
Patient is calling and says the [facility] states she needs prior-authorization for the Rx Modafinil. Thanks!

## 2016-02-13 NOTE — Telephone Encounter (Signed)
Noted, will watch for pa form/fim

## 2016-03-13 ENCOUNTER — Telehealth: Payer: Self-pay | Admitting: Neurology

## 2016-03-13 NOTE — Telephone Encounter (Signed)
[facility] [phone] called to request Prior Auth for modafinil (PROVIGIL) 200 MG tablet. Please call [phone].

## 2016-03-13 NOTE — Telephone Encounter (Signed)
PA for Modafinil completed by phone, and has been approved for dates 01-13-16 t hru 03-13-17.  No PA# given, pharmacy should just rerun rx.  Confirmation fax to follow w/i 72 hours.  I have spoken with Selena Batten and advised this has been done/fim

## 2016-03-15 NOTE — Telephone Encounter (Signed)
Provigil PA approved by [employer] FEP [phone]) through 03/13/17 - pt [id number].

## 2016-03-28 ENCOUNTER — Ambulatory Visit (INDEPENDENT_AMBULATORY_CARE_PROVIDER_SITE_OTHER): Payer: Federal, State, Local not specified - PPO | Admitting: Neurology

## 2016-03-28 ENCOUNTER — Encounter: Payer: Self-pay | Admitting: Neurology

## 2016-03-28 VITALS — BP 136/94 | Resp 16 | Ht 65.0 in | Wt 160.0 lb

## 2016-03-28 DIAGNOSIS — M549 Dorsalgia, unspecified: Secondary | ICD-10-CM | POA: Insufficient documentation

## 2016-03-28 DIAGNOSIS — M7062 Trochanteric bursitis, left hip: Secondary | ICD-10-CM

## 2016-03-28 DIAGNOSIS — R260 Ataxic gait: Secondary | ICD-10-CM | POA: Diagnosis not present

## 2016-03-28 DIAGNOSIS — M7061 Trochanteric bursitis, right hip: Secondary | ICD-10-CM | POA: Insufficient documentation

## 2016-03-28 DIAGNOSIS — G35 Multiple sclerosis: Secondary | ICD-10-CM | POA: Diagnosis not present

## 2016-03-28 DIAGNOSIS — F418 Other specified anxiety disorders: Secondary | ICD-10-CM | POA: Diagnosis not present

## 2016-03-28 DIAGNOSIS — M5442 Lumbago with sciatica, left side: Secondary | ICD-10-CM | POA: Diagnosis not present

## 2016-03-28 DIAGNOSIS — M546 Pain in thoracic spine: Secondary | ICD-10-CM | POA: Diagnosis not present

## 2016-03-28 DIAGNOSIS — R5383 Other fatigue: Secondary | ICD-10-CM | POA: Diagnosis not present

## 2016-03-28 DIAGNOSIS — R208 Other disturbances of skin sensation: Secondary | ICD-10-CM

## 2016-03-28 DIAGNOSIS — M5441 Lumbago with sciatica, right side: Secondary | ICD-10-CM | POA: Diagnosis not present

## 2016-03-28 DIAGNOSIS — G4700 Insomnia, unspecified: Secondary | ICD-10-CM

## 2016-03-28 DIAGNOSIS — G2581 Restless legs syndrome: Secondary | ICD-10-CM | POA: Diagnosis not present

## 2016-03-28 MED ORDER — DIAZEPAM 5 MG PO TABS: 5.0000 mg | ORAL_TABLET | Freq: Two times a day (BID) | ORAL | 3 refills | Status: DC | PRN

## 2016-03-28 MED ORDER — BACLOFEN 10 MG PO TABS: 10.0000 mg | ORAL_TABLET | Freq: Three times a day (TID) | ORAL | 11 refills | Status: DC

## 2016-03-28 NOTE — Progress Notes (Signed)
[facility]  PATIENT: Catherine Hubbard DOB: 1960/09/02   _________________________________   HISTORICAL  CHIEF COMPLAINT:  Chief Complaint  Patient presents with  . Multiple Sclerosis    Sts. she continues to tolerate Rebif well.  She c/o increased spasms in bilat legs, worse at  night.  She also c/o right shoulder pain onset about 5 mos. ago with no known injury.  No relief with chiropractic tx's.  She also c/o worsening depression due to pain/fim    HISTORY OF PRESENT ILLNESS:  Catherine Hubbard is a 55 year old woman with MS.    She is on Rebif and tolerates it well.     She denies any exacerbations But is feeling worse..   She is reporting more spasticity and pain.  Spasticity:   She is having more leg spasms.   They occur night and day but are worse at night.   She feels the right leg is dragging more.    The worse spasms are in her legs but she has spasms in her back as well (lower and shoulder lade region).   She has difficulty getting comfortable at night.    Sometimes ice pack as or heating pads help.    Flexeril has not helped.    She has not tried baclofen recently  LBP and leg pain:  She also has buttock pain radiating into the legs.   In the past, we did piriformis muscle injecitons and right trochanteric bursa injection and pain was much better.   Right proximal leg worse than left but left foot worse than right.   She felt much better  Afterwards and pain is still doing better.     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      Gait/strength/sensation:   Gait is worse with more pain recently.    She feels slightly weaker.    Sometimes she feels like she is being pushed to the right as she walks.   She has tingling in her hands and face.  She denies weakness but has had some spasticity and rarely takes baclofen.     Bladder:   She feels  bladder function is stable.,   No recent UTI    At baseline, she has mild urinary frequency.    She has occasional incomplete emptying.   She has occasional nocturia.  Vision:   She feels this is stable with rare mild diplopia and/or blurry vision when she reads a while or when very tired.  Mood/EtOH:   She has less depression and anxiety on Wellbutrin and Prozac.   She still sometimes feels mildly depressed.  She was without her antidepressants x 1 week and felt much worse.   She has some apathy.   She drinks alcohol less (3 beers 2-3 days a weeknow)      Cognition:   She reports mild difficulty with cognition, mostly occasional difficulty with short-term memory and verbal fluency.  She does not always complete tasks that she starts.  Fatigue:   She has fatigue and takes Provigil has needed .  She was tried on Ritalin or Adderall once but she did not like the way that she felt when she took it.    Sleep:   She is sleeping worse due to more pain.   She has some RLS but has not taken anything in maonths for it.  She notes difficulty falling asleep more than with sleep maintenance.  Baclofen helped some in the past.  MS History:   She was diagnosed with multiple sclerosis in 2009. Before that time, she had noted some gait ataxia but had not had any imaging studies. In 2009, due to progressive hearing loss, she had an MRI of the brain and  then had a lumbar puncture that also confirmed the diagnosis.  I have some of her early MRI reports there are several foci noted in the cervical and thoracic spinal cord in the brain, there were multiple hyperintense foci, mostly in the periventricular deep white matter many of these were contrast enhancing on 07/26/2008. There was another enhancing lesion adjacent to C2 in the spinal cord.  Initially, she was placed on Avonex. She did not feel good and she stopped. She then tried Copaxone but also stopped after a while. She moved here in 2014 and saw Dr. Renne Crigler at [facility]. She was placed on Rebif. She tolerates Rebif well.      MRI reports from 12/04/2012  showed multiple foci in the spinal cord at C2, C3-C4, and C4-C5. There were also foci at T4, T7, T9 and T10. The MRI of the brain showed 2 nonenhancing foci not present from studies in 2011. She reports having an MRI at [facility] 2015 but we do not have those films.   REVIEW OF SYSTEMS: Constitutional: No fevers, chills, sweats, or change in appetite.    She reports fatigue and insomnia Eyes: see above.  No eye pain  But some light sensitivity. Ear, nose and throat: No hearing loss, ear pain, nasal congestion, sore throat.   tinnitus Cardiovascular: No chest pain, palpitations Respiratory: No shortness of breath at rest or with exertion.   Some wheezes, once on asthma med's Gastrointestinal: No nausea, vomiting, diarrhea, abdominal pain, fecal incontinence. Genitourinary: see above Musculoskeletal: see above  She also notes neck pain Integumentary: No rash, pruritus, skin lesions Neurological: as above Psychiatric: as above Endocrine: No palpitations, diaphoresis, change in appetite, change in weigh or increased thirst.  Often feels cold Hematologic/Lymphatic: No anemia, purpura, petechiae. Allergic/Immunologic: No itchy/runny eyes, nasal congestion, recent allergic reactions, rashes  ALLERGIES: Allergies  Allergen Reactions  . Percocet [Oxycodone-Acetaminophen] Nausea And Vomiting    HOME MEDICATIONS:  Current Outpatient Prescriptions:  .  buPROPion (WELLBUTRIN XL) 300 MG 24 hr tablet, TAKE 1 TABLET (300 MG TOTAL) BY MOUTH DAILY., Disp: 30 tablet, Rfl: 11 .  FLUoxetine (PROZAC) 20 MG capsule, Take 1 capsule (20 mg total) by mouth daily., Disp: 30 capsule, Rfl: 11 .  modafinil (PROVIGIL) 200 MG tablet, Take 1 tablet in the morning and 1 tablet at noon., Disp: 60 tablet, Rfl: 5 .  valsartan (DIOVAN) 80 MG tablet, Take 80 mg by mouth daily., Disp: , Rfl:  .  baclofen (LIORESAL) 10 MG  tablet, Take 1 tablet (10 mg total) by mouth 3 (three) times daily., Disp: 90 each, Rfl: 11 .  cyclobenzaprine (FLEXERIL) 5 MG tablet, Take 1 tablet (5 mg total) by mouth every 8 (eight) hours as needed for muscle spasms. (Patient not taking: Reported on 02/08/2016), Disp: 90 tablet, Rfl: 1 .  diazepam (VALIUM) 5 MG tablet, Take 1 tablet (5 mg total) by mouth every 12 (twelve) hours as needed for muscle spasms., Disp: 30 tablet, Rfl: 3 .  HYDROcodone-acetaminophen (NORCO/VICODIN) 5-325 MG tablet, Take 1-2 tablets by mouth daily as needed. (Patient not taking: Reported on 02/08/2016), Disp: 30 tablet, Rfl: 0  PAST MEDICAL HISTORY: Past Medical History:  Diagnosis Date  . Headache   . Hearing loss   . Hypertension   . Multiple sclerosis (HCC)   . Vision abnormalities     PAST SURGICAL HISTORY: Past Surgical History:  Procedure Laterality Date  . ANKLE FRACTURE SURGERY Bilateral   . bariatric sleeve    . CARPAL TUNNEL RELEASE Bilateral   . ULNAR NERVE REPAIR Bilateral     FAMILY HISTORY: Family History  Problem Relation Age of Onset  . Cancer Mother   . Stroke Mother   . Cancer Father     SOCIAL HISTORY:  Social History   Social History  . Marital status: Married    Spouse name: N/A  . Number of children: N/A  . Years of education: N/A   Occupational History  . Not on file.   Social History Main Topics  . Smoking status: Former Games developer  . Smokeless tobacco: Never Used  . Alcohol use 0.0 oz/week     Comment: Sts. she drinks beer, liquor daily, varying amts/fim  . Drug use: No  . Sexual activity: No   Other Topics Concern  . Not on file   Social History Narrative  . No narrative on file     PHYSICAL EXAM  Vitals:   03/28/16 1508  BP: (!) 136/94  Resp: 16  Weight: 160 lb (72.6 kg)  Height:  (1.651 m)    Body mass index is 26.63 kg/m.   General: The patient is well-developed and well-nourished and in no acute distress  Neck/HEENT:  The neck is  mildly tender.  Good ROM  In neck.     Oropharynx is normal   Musculoskeletal:   Mild lumbar tenderness.    R > L  trochanteric bursa tenderness.   Moderate bilateral piriformis tenderness.   Moderate upper back/rhomboid tenderness.   Shoulders with good ROM.  Shoulders nontendr  Neurologic Exam  Mental status: The patient is alert and oriented x 3 at the time of the examination. The patient has apparent normal recent and remote memory, with an apparently normal attention span and concentration ability.   Speech is normal.  Cranial nerves: Extraocular movements are full.  . There is reduced right facial sensation to soft touch. Facial strength is normal.  Trapezius and sternocleidomastoid strength is normal. No dysarthria is noted.    She has reduced hearing on the right and Weber lateralizes right.  .  Motor:  Muscle bulk is normal.   Tone is normal. Strength is  5 / 5 in all 4 extremities.   Sensory:  She has symmetric sensation to touch and vibration in all 4 limbs.   Coordination: Cerebellar testing reveals good finger-nose-finger  bilaterally.  Gait and station: Station is normal.   Gait is minimally wide and arthritic. Tandem gait is mildly wide.   Reflexes: Deep tendon reflexes are symmetric and mildly increased in legs bilaterally.       DIAGNOSTIC DATA (LABS, IMAGING, TESTING) - I reviewed patient records, labs, notes, testing and imaging myself where available.     ASSESSMENT AND PLAN  Multiple sclerosis (HCC)  Ataxic gait  Bilateral low back pain with bilateral sciatica, unspecified chronicity  Trochanteric bursitis of both hips  Upper back pain  Dysesthesia  Other fatigue  Restless leg  Insomnia  Depression with anxiety   1.    Continue  Rebif 2.    Bilateral trochanteric bursa injections (hip) with a total of 30 mg Depo-Medrol in  5 mL Marcaine using sterile technique. She tolerated the procedure well and there were no complications. 3.     Bilateral  piriformis, bilateral rhomboid and bilateral T3-T4 paraspinal muscle trigger point injections with 50 mg Depo-Medrol in 6 mL Marcaine using sterile technique. She tolerated the procedure well and there were no complications. 4.   Add baclofen 10 mg po tid for spasticity.    Diazepam 5 mg po nightly  5.   Continue fluoxetine with bupropion for mood. Try to cut alcohol use down further (much better tan last year) 6.   She will return to see me in 4 months or sooner if she has new or worsening neurologic symptoms.   Richard A. Epimenio Foot, MD, PhD 03/28/2016, 3:45 PM Certified in Neurology, Clinical Neurophysiology, Sleep Medicine, Pain Medicine and Neuroimaging  [facility] [address], [address] [place], [place] [address] [phone]   .

## 2016-04-05 ENCOUNTER — Telehealth: Payer: Self-pay | Admitting: Neurology

## 2016-04-05 NOTE — Telephone Encounter (Signed)
Pt called said the pain in her chest is getting worse, not getting better. It is now in both sides. Please call

## 2016-04-05 NOTE — Telephone Encounter (Signed)
LMTC./fim 

## 2016-04-11 ENCOUNTER — Ambulatory Visit: Payer: Federal, State, Local not specified - PPO | Admitting: Neurology

## 2016-04-11 ENCOUNTER — Encounter (HOSPITAL_COMMUNITY): Payer: Self-pay | Admitting: Emergency Medicine

## 2016-04-11 DIAGNOSIS — R0789 Other chest pain: Secondary | ICD-10-CM | POA: Insufficient documentation

## 2016-04-11 DIAGNOSIS — Z87891 Personal history of nicotine dependence: Secondary | ICD-10-CM | POA: Diagnosis not present

## 2016-04-11 DIAGNOSIS — I10 Essential (primary) hypertension: Secondary | ICD-10-CM | POA: Insufficient documentation

## 2016-04-11 DIAGNOSIS — Z79899 Other long term (current) drug therapy: Secondary | ICD-10-CM | POA: Diagnosis not present

## 2016-04-11 DIAGNOSIS — R079 Chest pain, unspecified: Secondary | ICD-10-CM | POA: Diagnosis present

## 2016-04-11 NOTE — ED Triage Notes (Signed)
Patient with 6 month history of chest pain that comes and goes.  She states that it completely goes away and it has come back out of the blue.  She does have some shortness of breath with the pain, but she states she does have asthma also.  Patient is CAOx4 in triage, speaking in full sentences.

## 2016-04-12 ENCOUNTER — Emergency Department (HOSPITAL_COMMUNITY): Payer: Federal, State, Local not specified - PPO

## 2016-04-12 ENCOUNTER — Emergency Department (HOSPITAL_COMMUNITY)
Admission: EM | Admit: 2016-04-12 | Discharge: 2016-04-12 | Disposition: A | Discharge status: Home / Self Care | Payer: Federal, State, Local not specified - PPO | Attending: Emergency Medicine | Admitting: Emergency Medicine

## 2016-04-12 DIAGNOSIS — R0789 Other chest pain: Secondary | ICD-10-CM

## 2016-04-12 LAB — BASIC METABOLIC PANEL
Anion gap: 9 (ref 5–15)
BUN: 7 mg/dL (ref 6–20)
CO2: 25 mmol/L (ref 22–32)
CREATININE: 0.47 mg/dL (ref 0.44–1.00)
Calcium: 9.7 mg/dL (ref 8.9–10.3)
Chloride: 105 mmol/L (ref 101–111)
GFR calc Af Amer: >60 mL/min (ref >60)
GLUCOSE: 106 mg/dL — AB (ref 65–99)
POTASSIUM: 3.5 mmol/L (ref 3.5–5.1)
SODIUM: 139 mmol/L (ref 135–145)

## 2016-04-12 LAB — HEPATIC FUNCTION PANEL
ALK PHOS: 90 U/L (ref 38–126)
ALT: 28 U/L (ref 14–54)
AST: 43 U/L — ABNORMAL HIGH (ref 15–41)
Albumin: 3.9 g/dL (ref 3.5–5.0)
Total Bilirubin: 0.6 mg/dL (ref 0.3–1.2)
Total Protein: 7.4 g/dL (ref 6.5–8.1)

## 2016-04-12 LAB — I-STAT TROPONIN, ED: Troponin i, poc: 0 ng/mL (ref 0.00–0.08)

## 2016-04-12 LAB — CBC
HEMATOCRIT: 39.7 % (ref 36.0–46.0)
Hemoglobin: 13.6 g/dL (ref 12.0–15.0)
MCH: 33.6 pg (ref 26.0–34.0)
MCHC: 34.3 g/dL (ref 30.0–36.0)
MCV: 98 fL (ref 78.0–100.0)
PLATELETS: 291 10*3/uL (ref 150–400)
RBC: 4.05 MIL/uL (ref 3.87–5.11)
RDW: 12.1 % (ref 11.5–15.5)
WBC: 6.1 10*3/uL (ref 4.0–10.5)

## 2016-04-12 MED ORDER — TRAMADOL HCL 50 MG PO TABS: 50.0000 mg | ORAL_TABLET | Freq: Two times a day (BID) | ORAL | 0 refills | Status: DC | PRN

## 2016-04-12 MED ORDER — KETOROLAC TROMETHAMINE 60 MG/2ML IM SOLN
60.0000 mg | Freq: Once | INTRAMUSCULAR | Status: AC
  Administered 2016-04-12: 60 mg via INTRAMUSCULAR
  Filled 2016-04-12: qty 2

## 2016-04-12 MED ORDER — TRAMADOL HCL 50 MG PO TABS
50.0000 mg | ORAL_TABLET | Freq: Once | ORAL | Status: AC
  Administered 2016-04-12: 50 mg via ORAL
  Filled 2016-04-12: qty 1

## 2016-04-12 NOTE — ED Provider Notes (Signed)
[facility]-EMERGENCY DEPT Provider Note   CSN: [id number] Arrival date & time: 04/11/16  2352  By signing my name below, I, Catherine Hubbard, attest that this documentation has been prepared under the direction and in the presence of Tomasita CrumbleAdeleke Anthon Harpole, MD. Electronically Signed: Angelene GiovanniEmmanuella Hubbard, ED Scribe. 04/12/16. 3:25 AM.   History   Chief Complaint Chief Complaint  Patient presents with  . Chest Pain    HPI Comments: Catherine Hubbard is a 55 y.o. female with a hx of MS and hypertension who presents to the Emergency Department complaining of gradually worsening intermittent cramping, burning pain to the middle of her right breast that radiates to her right back onset 5 months ago. She notes her pain as if someone "stabbing and twisting the knife". She reports associated right shoulder pain that is now beginning in her left shoulder and intermittent SOB. She states that she is unsure of anything specific that exacerbates the pain. She states that she has tried heating pads with no relief. Pt has tried Baclofen with no relief. [place] to [place] onset of her pain. No alleviating factors noted. Pt has not tried any medications PTA. She denies any fever, generalized rash, or any open wounds.    The history is provided by the patient. No language interpreter was used.  Chest Pain  Associated symptoms include back pain and shortness of breath. Pertinent negatives include no fever.    Past Medical History:  Diagnosis Date  . Headache   . Hearing loss   . Hypertension   . Multiple sclerosis (HCC)   . Vision abnormalities     Patient Active Problem List   Diagnosis Date Noted  . Trochanteric bursitis of both hips 03/28/2016  . Upper back pain 03/28/2016  . Bilateral low back pain with bilateral sciatica 07/13/2015  . Right hip pain 07/13/2015  . Lumbar radicular pain 03/21/2015  . Other fatigue 01/11/2015  . Dysesthesia 01/11/2015  . Urinary  urgency 01/11/2015  . Multiple sclerosis (HCC) 10/06/2014  . Numbness 10/06/2014  . Ataxic gait 10/06/2014  . High risk medication use 10/06/2014  . Gastric bypass status for obesity 10/06/2014  . Cognitive changes 10/06/2014  . Depression with anxiety 10/06/2014  . Restless leg 10/06/2014  . Insomnia 10/06/2014  . Essential hypertension 10/06/2014  . Difficulty hearing 01/13/2014  . Other muscle spasm 01/13/2014    Past Surgical History:  Procedure Laterality Date  . ANKLE FRACTURE SURGERY Bilateral   . bariatric sleeve    . CARPAL TUNNEL RELEASE Bilateral   . ULNAR NERVE REPAIR Bilateral     OB History    No data available       Home Medications    Prior to Admission medications   Medication Sig Start Date End Date Taking? Authorizing Provider  baclofen (LIORESAL) 10 MG tablet Take 1 tablet (10 mg total) by mouth 3 (three) times daily. 03/28/16  Yes Richard Aida PufferA Sater, MD  buPROPion (WELLBUTRIN XL) 300 MG 24 hr tablet TAKE 1 TABLET (300 MG TOTAL) BY MOUTH DAILY. 10/24/15  Yes Asa Lenteichard A Sater, MD  diazepam (VALIUM) 5 MG tablet Take 1 tablet (5 mg total) by mouth every 12 (twelve) hours as needed for muscle spasms. 03/28/16  Yes Asa Lenteichard A Sater, MD  FLUoxetine (PROZAC) 20 MG capsule Take 1 capsule (20 mg total) by mouth daily. 10/24/15  Yes Asa Lenteichard A Sater, MD  valsartan (DIOVAN) 80 MG tablet Take 80 mg by mouth daily.  Yes Historical Provider, MD  cyclobenzaprine (FLEXERIL) 5 MG tablet Take 1 tablet (5 mg total) by mouth every 8 (eight) hours as needed for muscle spasms. Patient not taking: Reported on 02/08/2016 10/24/15   Asa Lente, MD  HYDROcodone-acetaminophen (NORCO/VICODIN) 5-325 MG tablet Take 1-2 tablets by mouth daily as needed. Patient not taking: Reported on 02/08/2016 10/24/15   Asa Lente, MD  modafinil (PROVIGIL) 200 MG tablet Take 1 tablet in the morning and 1 tablet at noon. Patient not taking: Reported on 04/12/2016 02/09/16   Asa Lente, MD    Family  History Family History  Problem Relation Age of Onset  . Cancer Mother   . Stroke Mother   . Cancer Father     Social History Social History  Substance Use Topics  . Smoking status: Former Games developer  . Smokeless tobacco: Never Used  . Alcohol use 0.0 oz/week     Comment: Sts. she drinks beer, liquor daily, varying amts/fim     Allergies   Percocet [oxycodone-acetaminophen]   Review of Systems Review of Systems  Constitutional: Negative for fever.  Respiratory: Positive for shortness of breath.   Musculoskeletal: Positive for arthralgias, back pain and myalgias.  Skin: Negative for rash and wound.  All other systems reviewed and are negative.    Physical Exam Updated Vital Signs BP 143/84   Pulse 89   Temp 97.9 F (36.6 C) (Oral)   Resp 17   SpO2 98%   Physical Exam  Constitutional: She is oriented to person, place, and time. She appears well-developed and well-nourished. No distress.  HENT:  Head: Normocephalic and atraumatic.  Nose: Nose normal.  Mouth/Throat: Oropharynx is clear and moist. No oropharyngeal exudate.  Eyes: Conjunctivae and EOM are normal. Pupils are equal, round, and reactive to light. No scleral icterus.  Neck: Normal range of motion. Neck supple. No JVD present. No tracheal deviation present. No thyromegaly present.  Cardiovascular: Normal rate, regular rhythm and normal heart sounds.  Exam reveals no gallop and no friction rub.   No murmur heard. Pulmonary/Chest: Effort normal and breath sounds normal. No respiratory distress. She has no wheezes. She exhibits no tenderness.  Abdominal: Soft. Bowel sounds are normal. She exhibits no distension and no mass. There is no tenderness. There is no rebound and no guarding.  Musculoskeletal: Normal range of motion. She exhibits no edema or tenderness.  Lymphadenopathy:    She has no cervical adenopathy.  Neurological: She is alert and oriented to person, place, and time. No cranial nerve deficit. She  exhibits normal muscle tone.  Skin: Skin is warm and dry. No rash noted. No erythema. No pallor.  Nursing note and vitals reviewed.    ED Treatments / Results  DIAGNOSTIC STUDIES: Oxygen Saturation is 98% on RA, normal by my interpretation.    COORDINATION OF CARE: 3:22 AM- Pt advised of plan for treatment and pt agrees. Pt informed of her lab results and x-ray results. She will receive additional lab work for further evaluation.    Labs (all labs ordered are listed, but only abnormal results are displayed) Labs Reviewed  BASIC METABOLIC PANEL - Abnormal; Notable for the following:       Result Value   Glucose, Bld 106 (*)    All other components within normal limits  CBC  I-STAT TROPOININ, ED    EKG  EKG Interpretation None       Radiology Dg Chest 2 View  Result Date: 04/12/2016 CLINICAL DATA:  55 y/o  F; six-month history of chest pain that comes in close with shortness of breath. History of asthma and broken ribs. EXAM: CHEST  2 VIEW COMPARISON:  Chest radiograph 02/07/2016. FINDINGS: Stable cardiomediastinal silhouette given positioning and technique. Clear lungs. No pneumothorax. No pleural effusion. Chronic right lateral rib fractures. No acute osseous abnormality is identified. IMPRESSION: No active cardiopulmonary disease. Electronically Signed   By: Mitzi Hansen M.D.   On: 04/12/2016 00:34    Procedures Procedures (including critical care time)  Medications Ordered in ED Medications - No data to display   Initial Impression / Assessment and Plan / ED Course  Tomasita Crumble, MD has reviewed the triage vital signs and the nursing notes.  Pertinent labs & imaging results that were available during my care of the patient were reviewed by me and considered in my medical decision making (see chart for details).  Clinical Course    Patient presents to the ED for R chest wall pain under her breast with radiation to her bilateral shoulders for 5 months.   History is not consistent with ACS or PE.  Likely chest wall pain.  EKG here does not show ischemic changes. Labs unremarkable. She was given tramadol and toradol for pain control.  Will Dc with tramadol for severe pain. PCP fu advised within 3 days. She appears well and in NAD. VS remain within her normal limits and she is safe for DC.  Final Clinical Impressions(s) / ED Diagnoses   Final diagnoses:  None    New Prescriptions New Prescriptions   No medications on file     I personally performed the services described in this documentation, which was scribed in my presence. The recorded information has been reviewed and is accurate.      Tomasita Crumble, MD 04/12/16 [phone]

## 2016-04-17 DIAGNOSIS — G8929 Other chronic pain: Secondary | ICD-10-CM | POA: Insufficient documentation

## 2016-04-20 ENCOUNTER — Emergency Department (HOSPITAL_COMMUNITY)
Admission: EM | Admit: 2016-04-20 | Discharge: 2016-04-20 | Disposition: A | Discharge status: Home / Self Care | Payer: Federal, State, Local not specified - PPO | Attending: Emergency Medicine | Admitting: Emergency Medicine

## 2016-04-20 ENCOUNTER — Encounter (HOSPITAL_COMMUNITY): Payer: Self-pay

## 2016-04-20 ENCOUNTER — Emergency Department (HOSPITAL_COMMUNITY): Payer: Federal, State, Local not specified - PPO

## 2016-04-20 DIAGNOSIS — R079 Chest pain, unspecified: Secondary | ICD-10-CM | POA: Diagnosis present

## 2016-04-20 DIAGNOSIS — I10 Essential (primary) hypertension: Secondary | ICD-10-CM | POA: Diagnosis not present

## 2016-04-20 DIAGNOSIS — Z79899 Other long term (current) drug therapy: Secondary | ICD-10-CM | POA: Diagnosis not present

## 2016-04-20 DIAGNOSIS — Z87891 Personal history of nicotine dependence: Secondary | ICD-10-CM | POA: Diagnosis not present

## 2016-04-20 DIAGNOSIS — R0789 Other chest pain: Secondary | ICD-10-CM | POA: Diagnosis not present

## 2016-04-20 LAB — CBC
HEMATOCRIT: 36.2 % (ref 36.0–46.0)
HEMOGLOBIN: 12.3 g/dL (ref 12.0–15.0)
MCH: 33.2 pg (ref 26.0–34.0)
MCHC: 34 g/dL (ref 30.0–36.0)
MCV: 97.8 fL (ref 78.0–100.0)
Platelets: 283 10*3/uL (ref 150–400)
RBC: 3.7 MIL/uL — ABNORMAL LOW (ref 3.87–5.11)
RDW: 12 % (ref 11.5–15.5)
WBC: 5.1 10*3/uL (ref 4.0–10.5)

## 2016-04-20 LAB — BASIC METABOLIC PANEL
ANION GAP: 9 (ref 5–15)
BUN: 7 mg/dL (ref 6–20)
CHLORIDE: 100 mmol/L — AB (ref 101–111)
CO2: 26 mmol/L (ref 22–32)
Calcium: 9 mg/dL (ref 8.9–10.3)
Creatinine, Ser: 0.47 mg/dL (ref 0.44–1.00)
GFR calc Af Amer: >60 mL/min (ref >60)
GLUCOSE: 99 mg/dL (ref 65–99)
POTASSIUM: 3.7 mmol/L (ref 3.5–5.1)
Sodium: 135 mmol/L (ref 135–145)

## 2016-04-20 LAB — I-STAT TROPONIN, ED: Troponin i, poc: 0.03 ng/mL (ref 0.00–0.08)

## 2016-04-20 MED ORDER — TRAMADOL HCL 50 MG PO TABS: 50.0000 mg | ORAL_TABLET | Freq: Four times a day (QID) | ORAL | 0 refills | Status: DC | PRN

## 2016-04-20 MED ORDER — KETOROLAC TROMETHAMINE 60 MG/2ML IM SOLN
60.0000 mg | Freq: Once | INTRAMUSCULAR | Status: AC
  Administered 2016-04-20: 60 mg via INTRAMUSCULAR
  Filled 2016-04-20: qty 2

## 2016-04-20 NOTE — ED Triage Notes (Signed)
Pt c/o of CP that has been going on and off for about six months, seen last week for the same and followed up with her PCP who told her she had costochondritis. Pt states CP came back this afternoon, r sided and radiated around the R side and down R arm. Pt also c/o of SOB, denies n/v.

## 2016-04-20 NOTE — ED Provider Notes (Signed)
[facility]-EMERGENCY DEPT Provider Note   CSN: [id number] Arrival date & time: 04/20/16  0006     History   Chief Complaint Chief Complaint  Patient presents with  . Chest Pain    HPI Catherine AsaMary J Moehle is a 55 y.o. female.  HPI   Catherine Hubbard is a 55 y.o. female who has a PMhx of multiple sclerosis, HTN, headache and hearing loss presents to the Emergency Department complaining of waxing and waning, gradual worsening, 5-6/10 right scapula pain onset last night. She notes associated right sided chest pain below the breast. Pt says she has had similar pain in the past. Pt says she typically gets chest pain when she has back spasm episodes. Pt says the area are tender when touched. There are no aggravating or modifying factors. Pt has not taken any medication to alleviate her pain. She denies rash.  The patient was seen and evaluated in March with similar complaints. She improved with Valium and muscle relaxant.   Past Medical History:  Diagnosis Date  . Headache   . Hearing loss   . Hypertension   . Multiple sclerosis (HCC)   . Vision abnormalities     Patient Active Problem List   Diagnosis Date Noted  . Trochanteric bursitis of both hips 03/28/2016  . Upper back pain 03/28/2016  . Bilateral low back pain with bilateral sciatica 07/13/2015  . Right hip pain 07/13/2015  . Lumbar radicular pain 03/21/2015  . Other fatigue 01/11/2015  . Dysesthesia 01/11/2015  . Urinary urgency 01/11/2015  . Multiple sclerosis (HCC) 10/06/2014  . Numbness 10/06/2014  . Ataxic gait 10/06/2014  . High risk medication use 10/06/2014  . Gastric bypass status for obesity 10/06/2014  . Cognitive changes 10/06/2014  . Depression with anxiety 10/06/2014  . Restless leg 10/06/2014  . Insomnia 10/06/2014  . Essential hypertension 10/06/2014  . Difficulty hearing 01/13/2014  . Other muscle spasm 01/13/2014    Past Surgical History:  Procedure Laterality Date  . ANKLE FRACTURE SURGERY  Bilateral   . bariatric sleeve    . CARPAL TUNNEL RELEASE Bilateral   . ULNAR NERVE REPAIR Bilateral     OB History    No data available       Home Medications    Prior to Admission medications   Medication Sig Start Date End Date Taking? Authorizing Provider  baclofen (LIORESAL) 10 MG tablet Take 1 tablet (10 mg total) by mouth 3 (three) times daily. 03/28/16   Asa Lenteichard A Sater, MD  buPROPion (WELLBUTRIN XL) 300 MG 24 hr tablet TAKE 1 TABLET (300 MG TOTAL) BY MOUTH DAILY. 10/24/15   Asa Lenteichard A Sater, MD  diazepam (VALIUM) 5 MG tablet Take 1 tablet (5 mg total) by mouth every 12 (twelve) hours as needed for muscle spasms. 03/28/16   Asa Lenteichard A Sater, MD  FLUoxetine (PROZAC) 20 MG capsule Take 1 capsule (20 mg total) by mouth daily. 10/24/15   Asa Lenteichard A Sater, MD  traMADol (ULTRAM) 50 MG tablet Take 1 tablet (50 mg total) by mouth every 12 (twelve) hours as needed. 04/12/16   Tomasita CrumbleAdeleke Oni, MD  traMADol (ULTRAM) 50 MG tablet Take 1 tablet (50 mg total) by mouth every 6 (six) hours as needed. 04/20/16   Deaaron Fulghum Neva SeatGreene, PA-C  valsartan (DIOVAN) 80 MG tablet Take 80 mg by mouth daily.    Historical Provider, MD    Family History Family History  Problem Relation Age of Onset  . Cancer Mother   . Stroke Mother   .  Cancer Father     Social History Social History  Substance Use Topics  . Smoking status: Former Games developer  . Smokeless tobacco: Never Used  . Alcohol use 0.0 oz/week     Comment: Sts. she drinks beer, liquor daily, varying amts/fim     Allergies   Percocet [oxycodone-acetaminophen]   Review of Systems Review of Systems Review of Systems All other systems negative except as documented in the HPI. All pertinent positives and negatives as reviewed in the HPI.   Physical Exam Updated Vital Signs BP 120/85 (BP Location: Right Arm)   Pulse 98   Temp 98.6 F (37 C) (Oral)   Resp 20   Ht 5\' 5"  (1.651 m)   Wt 68.9 kg   SpO2 98%   BMI 25.29 kg/m   Physical Exam    Constitutional: She appears well-developed and well-nourished. No distress.  HENT:  Head: Normocephalic and atraumatic.  Right Ear: Tympanic membrane and ear canal normal.  Left Ear: Tympanic membrane and ear canal normal.  Nose: Nose normal.  Mouth/Throat: Uvula is midline, oropharynx is clear and moist and mucous membranes are normal.  Eyes: Pupils are equal, round, and reactive to light.  Neck: Normal range of motion. Neck supple.  Cardiovascular: Normal rate and regular rhythm.   Pulmonary/Chest: Effort normal and breath sounds normal. No accessory muscle usage. No tachypnea and no bradypnea. No respiratory distress. She exhibits tenderness (pin point tenderness between right lower ribs). She exhibits no swelling.    Abdominal: Soft.  No signs of abdominal distention  Musculoskeletal:  No LE swelling  Neurological: She is alert.  Acting at baseline  Skin: Skin is warm and dry. No rash noted.  Nursing note and vitals reviewed.    ED Treatments / Results  Labs (all labs ordered are listed, but only abnormal results are displayed) Labs Reviewed  BASIC METABOLIC PANEL - Abnormal; Notable for the following:       Result Value   Chloride 100 (*)    All other components within normal limits  CBC - Abnormal; Notable for the following:    RBC 3.70 (*)    All other components within normal limits  I-STAT TROPOININ, ED    EKG  EKG Interpretation None       Radiology Dg Chest 2 View  Result Date: 04/20/2016 CLINICAL DATA:  Chest pain tonight. Symptoms intermittently for 6 months. History of hypertension. Smoker. History of asthma. EXAM: CHEST  2 VIEW COMPARISON:  04/12/2016 FINDINGS: Normal heart size and pulmonary vascularity. No focal airspace disease or consolidation in the lungs. No blunting of costophrenic angles. No pneumothorax. Mediastinal contours appear intact. Tortuous aorta. Degenerative changes in the shoulders. Old bilateral rib fractures. IMPRESSION: No active  cardiopulmonary disease. Electronically Signed   By: Burman Nieves M.D.   On: 04/20/2016 00:55    Procedures Procedures (including critical care time)  Medications Ordered in ED Medications  ketorolac (TORADOL) injection 60 mg (60 mg Intramuscular Given 04/20/16 0148)     Initial Impression / Assessment and Plan / ED Course  I have reviewed the triage vital signs and the nursing notes.  Pertinent labs & imaging results that were available during my care of the patient were reviewed by me and considered in my medical decision making (see chart for details).  Clinical Course    Patient is to be discharged with recommendation to follow up with PCP in regards to today's hospital visit. Chest pain is not likely of cardiac or pulmonary etiology  d/t presentation, perc negative, VSS, no tracheal deviation, no JVD or new murmur, RRR, breath sounds equal bilaterally, EKG without acute abnormalities, negative troponin, and negative CXR. Pt has been advised start Tramadol and return to the ED is CP becomes exertional, associated with diaphoresis or nausea, radiates to left jaw/arm, worsens or becomes concerning in any way. Pt appears reliable for follow up and is agreeable to discharge.     Final Clinical Impressions(s) / ED Diagnoses   Final diagnoses:  Chest wall pain    New Prescriptions New Prescriptions   TRAMADOL (ULTRAM) 50 MG TABLET    Take 1 tablet (50 mg total) by mouth every 6 (six) hours as needed.     Marlon Pel, PA-C 04/20/16 0304    Cy Blamer, MD 04/20/16 [phone]

## 2016-04-24 ENCOUNTER — Telehealth: Payer: Self-pay | Admitting: Neurology

## 2016-04-24 NOTE — Telephone Encounter (Signed)
Patient called to advise, had cortisone injections in back and hips at August 9th visit with Dr. Epimenio Foot, initially did okay, last week went to Emergency Room for spasms and pain, saw PCP Friday who recommended she call Dr. Epimenio Foot.

## 2016-04-24 NOTE — Telephone Encounter (Signed)
LMOM for pt. to call for appt. with RAS if she would like to be seen for back pain again/fim

## 2016-04-25 ENCOUNTER — Encounter: Payer: Self-pay | Admitting: *Deleted

## 2016-04-25 NOTE — Telephone Encounter (Signed)
I believe I have lmom for Catherine Hubbard to call.  It sounds as if her ans. machine may be broken, or either cutting off part of my message, so I am not sure how much of it, if any, Catherine Hubbard is getting.  For this reason, I have mailed an unable to contact letter to her  home address on file/fim

## 2016-04-26 ENCOUNTER — Ambulatory Visit: Payer: Federal, State, Local not specified - PPO | Admitting: Neurology

## 2016-05-09 NOTE — Telephone Encounter (Signed)
Pt returned RN's call °

## 2016-05-09 NOTE — Telephone Encounter (Signed)
I have spoken with Catherine Hubbard this afternoon.  She sts. she was seen in the ER 2 weeks ago, dx. with costrochondritis, and told to f/u with RAS.  Sts. back pain is no better over the last 2 weeks.  Appt. given with RAS on 05-11-16 at 0920/fim

## 2016-05-11 ENCOUNTER — Encounter: Payer: Self-pay | Admitting: Neurology

## 2016-05-11 ENCOUNTER — Ambulatory Visit (INDEPENDENT_AMBULATORY_CARE_PROVIDER_SITE_OTHER): Payer: Federal, State, Local not specified - PPO | Admitting: Neurology

## 2016-05-11 VITALS — BP 148/92 | HR 86 | Resp 16 | Ht 65.0 in | Wt 159.5 lb

## 2016-05-11 DIAGNOSIS — G35 Multiple sclerosis: Secondary | ICD-10-CM | POA: Diagnosis not present

## 2016-05-11 DIAGNOSIS — M62838 Other muscle spasm: Secondary | ICD-10-CM

## 2016-05-11 DIAGNOSIS — R5383 Other fatigue: Secondary | ICD-10-CM | POA: Diagnosis not present

## 2016-05-11 DIAGNOSIS — R208 Other disturbances of skin sensation: Secondary | ICD-10-CM

## 2016-05-11 DIAGNOSIS — M940 Chondrocostal junction syndrome [Tietze]: Secondary | ICD-10-CM | POA: Diagnosis not present

## 2016-05-11 DIAGNOSIS — F418 Other specified anxiety disorders: Secondary | ICD-10-CM

## 2016-05-11 DIAGNOSIS — R260 Ataxic gait: Secondary | ICD-10-CM

## 2016-05-11 MED ORDER — PREDNISONE 10 MG (21) PO TBPK: ORAL_TABLET | ORAL | 0 refills | Status: DC

## 2016-05-11 MED ORDER — NABUMETONE 500 MG PO TABS: 500.0000 mg | ORAL_TABLET | Freq: Two times a day (BID) | ORAL | 2 refills | Status: DC

## 2016-05-11 NOTE — Progress Notes (Signed)
[facility]  PATIENT: Catherine Hubbard DOB: 19-Feb-1961   _________________________________   HISTORICAL  CHIEF COMPLAINT:  Chief Complaint  Patient presents with  . Multiple Sclerosis    Sts. she continues to toleratee Rebif well.  Sts. she was in the ER on 04-20-16 with increased right sided chest and right sided upper back pain, and was dx. with Costochondritis; advised to f/u with RAS.  Sts. she was given rx. for Tramadol but is out of this/fim  . Back Pain    HISTORY OF PRESENT ILLNESS:  Catherine Hubbard is a 55 year old woman with MS.      MS:   She is on Rebif and tolerates it well.     She denies any exacerbations.  She feels stable.     LBP and hip pain are better after she received injections at the last visit.   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  Chest pain:   She reports chest pain on the right.   Pain is worse with a deep breath.   She was diagnosed with costochondritis and given a Toradol shot (with benefit) and was prescribed tramadol.   Pain was better x 2 days then returned.   Motrin did not help  Spasticity:  he has difficulty getting comfortable at night.    Sometimes ice pack as or heating pads help.    Flexeril has not helped.    She has not tried baclofen recently      Gait/strength/sensation:   Gait is stable with occasional stumbles.  She has spasticity.   Spasms occur night and day but are worse at night.   She feels the right leg is dragging more.   .    She feels slightly weaker.    Sometimes she feels like she is being pushed to the right as she walks.   She has tingling in her hands and face.  Baclofen has helped spasticity some.     Bladder:   She feels bladder function is stable.,   No recent UTI    At baseline, she has mild urinary frequency.    She has occasional incomplete emptying.   She has occasional  nocturia.  Vision:   She feels this is stable with rare mild diplopia and/or blurry vision when she reads a while or when very tired.  Mood/EtOH:   Ddepression and anxiety are worse with her increased pain.    She is on Wellbutrin and Prozac.   She still sometimes feels mildly depressed.     She has some apathy.   She drinks alcohol more with her increased paon    Cognition:   She reports mild difficulty with cognition, mostly occasional difficulty with short-term memory and verbal fluency.  She does not always complete tasks that she starts.  Fatigue:   She has fatigue and takes Provigil has needed .  She was tried on Ritalin or Adderall once but she did not like the way that she felt when she took it.    Sleep:   She is sleeping poorly due to more pain.   She has some RLS but has not taken anything in maonths for it.   She notes difficulty falling asleep more than with sleep maintenance.  B  MS History:   She was diagnosed with multiple sclerosis in 2009. Before that time, she had noted some  gait ataxia but had not had any imaging studies. In 2009, due to progressive hearing loss, she had an MRI of the brain and  then had a lumbar puncture that also confirmed the diagnosis.  I have some of her early MRI reports there are several foci noted in the cervical and thoracic spinal cord in the brain, there were multiple hyperintense foci, mostly in the periventricular deep white matter many of these were contrast enhancing on 07/26/2008. There was another enhancing lesion adjacent to C2 in the spinal cord.  Initially, she was placed on Avonex. She did not feel good and she stopped. She then tried Copaxone but also stopped after a while. She moved here in 2014 and saw Dr. Renne Crigler at [facility]. She was placed on Rebif. She tolerates Rebif well.      MRI reports from 12/04/2012  showed multiple foci in the spinal cord at C2, C3-C4, and C4-C5. There were also foci at T4, T7, T9 and T10. The MRI of the brain  showed 2 nonenhancing foci not present from studies in 2011. She reports having an MRI at [facility] 2015 but we do not have those films.   REVIEW OF SYSTEMS: Constitutional: No fevers, chills, sweats, or change in appetite.    She reports fatigue and insomnia Eyes: see above.  No eye pain  But some light sensitivity. Ear, nose and throat: No hearing loss, ear pain, nasal congestion, sore throat.   tinnitus Cardiovascular: No chest pain, palpitations Respiratory: No shortness of breath at rest or with exertion.   Some wheezes, once on asthma med's Gastrointestinal: No nausea, vomiting, diarrhea, abdominal pain, fecal incontinence. Genitourinary: see above Musculoskeletal: see above  She also notes neck pain Integumentary: No rash, pruritus, skin lesions Neurological: as above Psychiatric: as above Endocrine: No palpitations, diaphoresis, change in appetite, change in weigh or increased thirst.  Often feels cold Hematologic/Lymphatic: No anemia, purpura, petechiae. Allergic/Immunologic: No itchy/runny eyes, nasal congestion, recent allergic reactions, rashes  ALLERGIES: Allergies  Allergen Reactions  . Percocet [Oxycodone-Acetaminophen] Nausea And Vomiting    HOME MEDICATIONS:  Current Outpatient Prescriptions:  .  baclofen (LIORESAL) 10 MG tablet, Take 1 tablet (10 mg total) by mouth 3 (three) times daily., Disp: 90 each, Rfl: 11 .  buPROPion (WELLBUTRIN XL) 300 MG 24 hr tablet, TAKE 1 TABLET (300 MG TOTAL) BY MOUTH DAILY., Disp: 30 tablet, Rfl: 11 .  diazepam (VALIUM) 5 MG tablet, Take 1 tablet (5 mg total) by mouth every 12 (twelve) hours as needed for muscle spasms., Disp: 30 tablet, Rfl: 3 .  FLUoxetine (PROZAC) 20 MG capsule, Take 1 capsule (20 mg total) by mouth daily., Disp: 30 capsule, Rfl: 11 .  valsartan (DIOVAN) 80 MG tablet, Take 80 mg by mouth daily., Disp: , Rfl:  .  nabumetone (RELAFEN) 500 MG tablet, Take 1 tablet (500 mg total) by mouth 2 (two) times daily.,  Disp: 60 tablet, Rfl: 2 .  predniSONE (STERAPRED UNI-PAK 21 TAB) 10 MG (21) TBPK tablet, 6 pills the first day po, 5 pills po next day, 4 pills po next day, 3 pills po next day, 2 pills po the next day, 1 pill po next day, Disp: 21 tablet, Rfl: 0 .  traMADol (ULTRAM) 50 MG tablet, Take 1 tablet (50 mg total) by mouth every 12 (twelve) hours as needed. (Patient not taking: Reported on 05/11/2016), Disp: 10 tablet, Rfl: 0 .  traMADol (ULTRAM) 50 MG tablet, Take 1 tablet (50 mg total) by mouth every 6 (  six) hours as needed. (Patient not taking: Reported on 05/11/2016), Disp: 10 tablet, Rfl: 0  PAST MEDICAL HISTORY: Past Medical History:  Diagnosis Date  . Headache   . Hearing loss   . Hypertension   . Multiple sclerosis (HCC)   . Vision abnormalities     PAST SURGICAL HISTORY: Past Surgical History:  Procedure Laterality Date  . ANKLE FRACTURE SURGERY Bilateral   . bariatric sleeve    . CARPAL TUNNEL RELEASE Bilateral   . ULNAR NERVE REPAIR Bilateral     FAMILY HISTORY: Family History  Problem Relation Age of Onset  . Cancer Mother   . Stroke Mother   . Cancer Father     SOCIAL HISTORY:  Social History   Social History  . Marital status: Married    Spouse name: N/A  . Number of children: N/A  . Years of education: N/A   Occupational History  . Not on file.   Social History Main Topics  . Smoking status: Former Games developer  . Smokeless tobacco: Never Used  . Alcohol use 0.0 oz/week     Comment: Sts. she drinks beer, liquor daily, varying amts/fim  . Drug use: No  . Sexual activity: No   Other Topics Concern  . Not on file   Social History Narrative  . No narrative on file     PHYSICAL EXAM  Vitals:   05/11/16 0931  BP: (!) 148/92  Pulse: 86  Resp: 16  Weight: 159 lb 8 oz (72.3 kg)  Height: 5\' 5"  (1.651 m)    Body mass index is 26.54 kg/m.   General: The patient is well-developed and well-nourished and in no acute distress  Neck/HEENT:  The neck is  mildly tender.  Good ROM  In neck.       Musculoskeletal:   Moderately severe sternal/costochondral pain on the right.  Mild lumbar tenderness.    Shoulders with good ROM.  Shoulders nontender  Neurologic Exam  Mental status: The patient is alert and oriented x 3 at the time of the examination. The patient has apparent normal recent and remote memory, with an apparently normal attention span and concentration ability.   Speech is normal.  Cranial nerves: Extraocular movements are full.  . There is reduced right facial sensation to soft touch. Facial strength is normal.  Trapezius and sternocleidomastoid strength is normal. No dysarthria is noted.    She has reduced hearing on the right and Weber lateralizes right.  .  Motor:  Muscle bulk is normal.   Tone is normal. Strength is  5 / 5 in all 4 extremities.   Sensory:  She has symmetric sensation to touch and vibration in all 4 limbs.   Coordination: Cerebellar testing reveals good finger-nose-finger  bilaterally.  Gait and station: Station is normal.   Gait is minimally wide and arthritic. Tandem gait is mildly wide.   Reflexes: Deep tendon reflexes are symmetric and mildly increased in legs bilaterally.       DIAGNOSTIC DATA (LABS, IMAGING, TESTING) - I reviewed patient records, labs, notes, testing and imaging myself where available.     ASSESSMENT AND PLAN  Multiple sclerosis (HCC)  Costochondritis  Ataxic gait  Depression with anxiety  Other fatigue  Dysesthesia  Other muscle spasm   1.    Continue  Rebif 2.    Steroid pack and nabumetone for costochondritis.  Add OTC pepcid bid 3.   Continue baclofen 10 mg po tid for spasticity.    Diazepam 5  mg po nightly  4.   Continue fluoxetine with bupropion for mood. Try to cut alcohol use down further (much better tan last year) 5.   Toradol 60 mg IM x 1 She will return to see me in 4 months or sooner if she has new or worsening neurologic symptoms.   Edwing Figley A. Epimenio FootSater,  MD, PhD 05/11/2016, 10:08 AM Certified in Neurology, Clinical Neurophysiology, Sleep Medicine, Pain Medicine and Neuroimaging  [facility] [address], [address] [place], [place] [address] [phone]   .

## 2016-05-21 DIAGNOSIS — M5136 Other intervertebral disc degeneration, lumbar region: Secondary | ICD-10-CM | POA: Diagnosis not present

## 2016-05-21 DIAGNOSIS — M9904 Segmental and somatic dysfunction of sacral region: Secondary | ICD-10-CM | POA: Diagnosis not present

## 2016-05-21 DIAGNOSIS — M5417 Radiculopathy, lumbosacral region: Secondary | ICD-10-CM | POA: Diagnosis not present

## 2016-05-21 DIAGNOSIS — M9903 Segmental and somatic dysfunction of lumbar region: Secondary | ICD-10-CM | POA: Diagnosis not present

## 2016-05-22 ENCOUNTER — Emergency Department (HOSPITAL_COMMUNITY)
Admission: EM | Admit: 2016-05-22 | Discharge: 2016-05-22 | Disposition: A | Discharge status: Home / Self Care | Payer: Federal, State, Local not specified - PPO | Attending: Emergency Medicine | Admitting: Emergency Medicine

## 2016-05-22 ENCOUNTER — Emergency Department (HOSPITAL_COMMUNITY): Payer: Federal, State, Local not specified - PPO

## 2016-05-22 ENCOUNTER — Encounter (HOSPITAL_COMMUNITY): Payer: Self-pay

## 2016-05-22 DIAGNOSIS — I10 Essential (primary) hypertension: Secondary | ICD-10-CM | POA: Diagnosis not present

## 2016-05-22 DIAGNOSIS — R079 Chest pain, unspecified: Secondary | ICD-10-CM | POA: Insufficient documentation

## 2016-05-22 DIAGNOSIS — Z5321 Procedure and treatment not carried out due to patient leaving prior to being seen by health care provider: Secondary | ICD-10-CM | POA: Diagnosis not present

## 2016-05-22 DIAGNOSIS — Z87891 Personal history of nicotine dependence: Secondary | ICD-10-CM | POA: Insufficient documentation

## 2016-05-22 LAB — I-STAT TROPONIN, ED: TROPONIN I, POC: 0.02 ng/mL (ref 0.00–0.08)

## 2016-05-22 LAB — BASIC METABOLIC PANEL
Anion gap: 12 (ref 5–15)
BUN: 6 mg/dL (ref 6–20)
CALCIUM: 9.4 mg/dL (ref 8.9–10.3)
CHLORIDE: 98 mmol/L — AB (ref 101–111)
CO2: 24 mmol/L (ref 22–32)
CREATININE: 0.42 mg/dL — AB (ref 0.44–1.00)
GFR calc non Af Amer: >60 mL/min (ref >60)
Glucose, Bld: 101 mg/dL — ABNORMAL HIGH (ref 65–99)
Potassium: 3.6 mmol/L (ref 3.5–5.1)
SODIUM: 134 mmol/L — AB (ref 135–145)

## 2016-05-22 LAB — CBC
HCT: 35.7 % — ABNORMAL LOW (ref 36.0–46.0)
Hemoglobin: 11.9 g/dL — ABNORMAL LOW (ref 12.0–15.0)
MCH: 32.6 pg (ref 26.0–34.0)
MCHC: 33.3 g/dL (ref 30.0–36.0)
MCV: 97.8 fL (ref 78.0–100.0)
PLATELETS: 317 10*3/uL (ref 150–400)
RBC: 3.65 MIL/uL — AB (ref 3.87–5.11)
RDW: 11.9 % (ref 11.5–15.5)
WBC: 3.7 10*3/uL — ABNORMAL LOW (ref 4.0–10.5)

## 2016-05-22 NOTE — ED Notes (Signed)
Pt leaving at this time, Pt was told and informed about wait time and encouraged to stay, pt was next to go back but declined further wait and left.

## 2016-05-22 NOTE — ED Triage Notes (Signed)
Pt states that about 2-3 hours ago, started having sharp R sided CP, under her breast, radiation to back, with n/v. Denies SOB.

## 2016-05-29 DIAGNOSIS — M9903 Segmental and somatic dysfunction of lumbar region: Secondary | ICD-10-CM | POA: Diagnosis not present

## 2016-05-29 DIAGNOSIS — M9904 Segmental and somatic dysfunction of sacral region: Secondary | ICD-10-CM | POA: Diagnosis not present

## 2016-05-29 DIAGNOSIS — M5417 Radiculopathy, lumbosacral region: Secondary | ICD-10-CM | POA: Diagnosis not present

## 2016-05-29 DIAGNOSIS — M5136 Other intervertebral disc degeneration, lumbar region: Secondary | ICD-10-CM | POA: Diagnosis not present

## 2016-06-18 ENCOUNTER — Encounter (HOSPITAL_COMMUNITY): Payer: Self-pay | Admitting: Emergency Medicine

## 2016-06-18 DIAGNOSIS — J45909 Unspecified asthma, uncomplicated: Secondary | ICD-10-CM | POA: Diagnosis not present

## 2016-06-18 DIAGNOSIS — W01198A Fall on same level from slipping, tripping and stumbling with subsequent striking against other object, initial encounter: Secondary | ICD-10-CM | POA: Diagnosis not present

## 2016-06-18 DIAGNOSIS — S20222A Contusion of left back wall of thorax, initial encounter: Secondary | ICD-10-CM | POA: Insufficient documentation

## 2016-06-18 DIAGNOSIS — Y939 Activity, unspecified: Secondary | ICD-10-CM | POA: Diagnosis not present

## 2016-06-18 DIAGNOSIS — S2242XA Multiple fractures of ribs, left side, initial encounter for closed fracture: Secondary | ICD-10-CM | POA: Insufficient documentation

## 2016-06-18 DIAGNOSIS — R0789 Other chest pain: Secondary | ICD-10-CM | POA: Diagnosis not present

## 2016-06-18 DIAGNOSIS — Y92009 Unspecified place in unspecified non-institutional (private) residence as the place of occurrence of the external cause: Secondary | ICD-10-CM | POA: Insufficient documentation

## 2016-06-18 DIAGNOSIS — Y999 Unspecified external cause status: Secondary | ICD-10-CM | POA: Insufficient documentation

## 2016-06-18 DIAGNOSIS — Z87891 Personal history of nicotine dependence: Secondary | ICD-10-CM | POA: Insufficient documentation

## 2016-06-18 DIAGNOSIS — S299XXA Unspecified injury of thorax, initial encounter: Secondary | ICD-10-CM | POA: Diagnosis present

## 2016-06-18 DIAGNOSIS — I10 Essential (primary) hypertension: Secondary | ICD-10-CM | POA: Insufficient documentation

## 2016-06-18 DIAGNOSIS — S4992XA Unspecified injury of left shoulder and upper arm, initial encounter: Secondary | ICD-10-CM | POA: Diagnosis not present

## 2016-06-18 MED ORDER — ACETAMINOPHEN 325 MG PO TABS
650.0000 mg | ORAL_TABLET | Freq: Once | ORAL | Status: AC
  Administered 2016-06-19: 650 mg via ORAL

## 2016-06-18 NOTE — ED Triage Notes (Signed)
Pt. slipped and fell at home this evening , she hit her back against a dresser , denies LOC / ambulatory , reports left upper/lower back pain , no hematuria , drank  2 beers this evening .

## 2016-06-19 ENCOUNTER — Emergency Department (HOSPITAL_COMMUNITY): Payer: Federal, State, Local not specified - PPO

## 2016-06-19 ENCOUNTER — Emergency Department (HOSPITAL_COMMUNITY)
Admission: EM | Admit: 2016-06-19 | Discharge: 2016-06-19 | Disposition: A | Discharge status: Home / Self Care | Payer: Federal, State, Local not specified - PPO | Attending: Emergency Medicine | Admitting: Emergency Medicine

## 2016-06-19 DIAGNOSIS — R0789 Other chest pain: Secondary | ICD-10-CM | POA: Diagnosis not present

## 2016-06-19 DIAGNOSIS — S2242XA Multiple fractures of ribs, left side, initial encounter for closed fracture: Secondary | ICD-10-CM

## 2016-06-19 DIAGNOSIS — S4992XA Unspecified injury of left shoulder and upper arm, initial encounter: Secondary | ICD-10-CM | POA: Diagnosis not present

## 2016-06-19 DIAGNOSIS — W19XXXA Unspecified fall, initial encounter: Secondary | ICD-10-CM

## 2016-06-19 HISTORY — DX: Unspecified asthma, uncomplicated: J45.909

## 2016-06-19 LAB — URINALYSIS, ROUTINE W REFLEX MICROSCOPIC
BILIRUBIN URINE: NEGATIVE
Glucose, UA: NEGATIVE mg/dL
Hgb urine dipstick: NEGATIVE
Ketones, ur: NEGATIVE mg/dL
Leukocytes, UA: NEGATIVE
NITRITE: NEGATIVE
PROTEIN: NEGATIVE mg/dL
SPECIFIC GRAVITY, URINE: 1.004 — AB (ref 1.005–1.030)
pH: 5.5 (ref 5.0–8.0)

## 2016-06-19 MED ORDER — OXYCODONE-ACETAMINOPHEN 5-325 MG PO TABS
1.0000 | ORAL_TABLET | Freq: Once | ORAL | Status: AC
  Administered 2016-06-19: 1 via ORAL
  Filled 2016-06-19: qty 1

## 2016-06-19 MED ORDER — ACETAMINOPHEN 325 MG PO TABS
ORAL_TABLET | ORAL | Status: AC
  Filled 2016-06-19: qty 2

## 2016-06-19 MED ORDER — OXYCODONE-ACETAMINOPHEN 5-325 MG PO TABS: 1.0000 | ORAL_TABLET | ORAL | 0 refills | Status: DC | PRN

## 2016-06-19 MED ORDER — ONDANSETRON HCL 4 MG PO TABS: 4.0000 mg | ORAL_TABLET | Freq: Four times a day (QID) | ORAL | 0 refills | Status: DC

## 2016-06-19 MED ORDER — ONDANSETRON 4 MG PO TBDP
8.0000 mg | ORAL_TABLET | Freq: Once | ORAL | Status: AC
  Administered 2016-06-19: 8 mg via ORAL
  Filled 2016-06-19: qty 2

## 2016-06-19 MED ORDER — OXYCODONE HCL 5 MG PO TABS
5.0000 mg | ORAL_TABLET | Freq: Once | ORAL | Status: AC
  Administered 2016-06-19: 5 mg via ORAL
  Filled 2016-06-19: qty 1

## 2016-06-19 NOTE — ED Provider Notes (Signed)
[facility]-EM[facility]ENCY DEPT Provider Note   CSN: [id number] Arrival date & time: 06/18/16  2119     History   Chief Complaint Chief Complaint  Patient presents with  . Fall    HPI Catherine Hubbard is a 55 y.o. female.  Slipped while wearing socks on slick surface and fell backward, hitting her left upper back on corner of dresser and sliding down to sitting position. She complains of severe pain to left lateral upper back that is worse with breathing or any movement. No SOB, abdominal pain, head injury, nausea/vomiting. No neck pain or LE involvement.    The history is provided by the patient. No language interpreter was used.  Fall  Pertinent negatives include no chest pain, no abdominal pain, no headaches and no shortness of breath.    Past Medical History:  Diagnosis Date  . Asthma   . Headache   . Hearing loss   . Hypertension   . Multiple sclerosis (HCC)   . Vision abnormalities     Patient Active Problem List   Diagnosis Date Noted  . Costochondritis 05/11/2016  . Trochanteric bursitis of both hips 03/28/2016  . Upper back pain 03/28/2016  . Bilateral low back pain with bilateral sciatica 07/13/2015  . Right hip pain 07/13/2015  . Lumbar radicular pain 03/21/2015  . Other fatigue 01/11/2015  . Dysesthesia 01/11/2015  . Urinary urgency 01/11/2015  . Multiple sclerosis (HCC) 10/06/2014  . Numbness 10/06/2014  . Ataxic gait 10/06/2014  . High risk medication use 10/06/2014  . Gastric bypass status for obesity 10/06/2014  . Cognitive changes 10/06/2014  . Depression with anxiety 10/06/2014  . Restless leg 10/06/2014  . Insomnia 10/06/2014  . Essential hypertension 10/06/2014  . Difficulty hearing 01/13/2014  . Other muscle spasm 01/13/2014    Past Surgical History:  Procedure Laterality Date  . ANKLE FRACTURE SURGERY Bilateral   . bariatric sleeve    . CARPAL TUNNEL RELEASE Bilateral   . ULNAR NERVE REPAIR Bilateral     OB History    No data  available       Home Medications    Prior to Admission medications   Medication Sig Start Date End Date Taking? Authorizing Provider  albuterol (PROVENTIL HFA;VENTOLIN HFA) 108 (90 Base) MCG/ACT inhaler Inhale 2 puffs into the lungs every 6 (six) hours as needed for wheezing or shortness of breath.   Yes Historical Provider, MD  buPROPion (WELLBUTRIN XL) 300 MG 24 hr tablet TAKE 1 TABLET (300 MG TOTAL) BY MOUTH DAILY. Patient taking differently: Take 300 mg by mouth daily.  10/24/15  Yes Hubbard Lente, MD  cyclobenzaprine (FLEXERIL) 5 MG tablet Take 5 mg by mouth every 8 (eight) hours as needed for muscle spasms.   Yes Historical Provider, MD  diazepam (VALIUM) 5 MG tablet Take 1 tablet (5 mg total) by mouth every 12 (twelve) hours as needed for muscle spasms. 03/28/16  Yes Hubbard Lente, MD  FLUoxetine (PROZAC) 20 MG capsule Take 1 capsule (20 mg total) by mouth daily. 10/24/15  Yes Hubbard Lente, MD  Interferon Beta-1a (REBIF) 22 MCG/0.5ML SOSY Inject 22 mcg into the skin every Monday, Wednesday, and Friday.    Yes Historical Provider, MD  Liniments (DEEP BLUE RELIEF EX) Apply 1 application topically 2 (two) times daily as needed (pain).   Yes Historical Provider, MD  valsartan (DIOVAN) 80 MG tablet Take 80 mg by mouth daily.   Yes Historical Provider, MD  baclofen (LIORESAL) 10 MG tablet Take  1 tablet (10 mg total) by mouth 3 (three) times daily. Patient not taking: Reported on 06/18/2016 03/28/16   Hubbard Lente, MD  nabumetone (RELAFEN) 500 MG tablet Take 1 tablet (500 mg total) by mouth 2 (two) times daily. Patient not taking: Reported on 06/18/2016 05/11/16   Hubbard Lente, MD  traMADol (ULTRAM) 50 MG tablet Take 1 tablet (50 mg total) by mouth every 12 (twelve) hours as needed. Patient not taking: Reported on 06/18/2016 04/12/16   Tomasita Crumble, MD  traMADol (ULTRAM) 50 MG tablet Take 1 tablet (50 mg total) by mouth every 6 (six) hours as needed. Patient not taking: Reported on  06/18/2016 04/20/16   Marlon Pel, PA-C    Family History Family History  Problem Relation Age of Onset  . Cancer Mother   . Stroke Mother   . Cancer Father     Social History Social History  Substance Use Topics  . Smoking status: Former Games developer  . Smokeless tobacco: Never Used  . Alcohol use 0.0 oz/week     Comment: Sts. she drinks beer, liquor daily, varying amts/fim     Allergies   Ibuprofen and Percocet [oxycodone-acetaminophen]   Review of Systems Review of Systems  Constitutional: Negative for chills and fever.  Respiratory: Negative for shortness of breath.        See HPI.  Cardiovascular: Negative.  Negative for chest pain.  Gastrointestinal: Negative.  Negative for abdominal pain and nausea.  Musculoskeletal: Positive for back pain. Negative for neck pain.       See HPI.  Skin: Negative.  Negative for wound.  Neurological: Negative.  Negative for syncope, weakness, light-headedness and headaches.     Physical Exam Updated Vital Signs BP (!) 132/102 (BP Location: Right Arm)   Pulse 102   Temp 98.3 F (36.8 C) (Oral)   SpO2 99%   Physical Exam  Constitutional: She is oriented to person, place, and time. She appears well-developed and well-nourished.  HENT:  Head: Normocephalic and atraumatic.  Neck: Normal range of motion. Neck supple.  Cardiovascular: Normal rate and regular rhythm.   Pulmonary/Chest: Breath sounds normal.  Splinting.  Abdominal: Soft. Bowel sounds are normal. There is no tenderness (specifically, no LUQ tenderness. ). There is no rebound and no guarding.  Genitourinary:  Genitourinary Comments: There is left CVA area tenderness.  Musculoskeletal: Normal range of motion.  Neurological: She is alert and oriented to person, place, and time.  Skin: Skin is warm and dry. No rash noted.  Ecchymotic changes to left lateral upper back without laceration.  Psychiatric: She has a normal mood and affect.     ED Treatments / Results    Labs (all labs ordered are listed, but only abnormal results are displayed) Labs Reviewed - No data to display  EKG  EKG Interpretation None       Radiology No results found. Dg Chest 2 View  Result Date: 05/22/2016 CLINICAL DATA:  55 year old female with right-sided chest pain. EXAM: CHEST  2 VIEW COMPARISON:  Chest radiograph dated 04/20/2016 FINDINGS: Two views of the chest do not demonstrate a focal consolidation. There is no pleural effusion or pneumothorax. The cardiac silhouette is within normal limits. Old appearing right lateral rib fracture similar to prior radiograph. No acute fracture. IMPRESSION: No active cardiopulmonary disease. Electronically Signed   By: Elgie Collard M.D.   On: 05/22/2016 01:15   Dg Ribs Unilateral W/chest Left  Result Date: 06/19/2016 CLINICAL DATA:  Larey Seat, striking a cabinet. Shoulder and  chest wall pain on the left. EXAM: LEFT RIBS AND CHEST - 3+ VIEW COMPARISON:  05/22/2016 FINDINGS: Healed fracture deformity of the left sixth rib. Acute mildly displaced fractures of the left seventh and eighth ribs posterolaterally. No pneumothorax. No significant effusion. The lungs are clear. Mediastinal contours are normal. IMPRESSION: Left seventh and eighth rib fractures, mildly displaced. No pneumothorax or effusion. The lungs are clear. Electronically Signed   By: Ellery Plunkaniel R Mitchell M.D.   On: 06/19/2016 02:35   Dg Shoulder Left  Result Date: 06/19/2016 CLINICAL DATA:  Larey SeatFell, striking a cabinet. Shoulder and chest wall pain on the left. EXAM: LEFT SHOULDER - 2+ VIEW COMPARISON:  None. FINDINGS: There is no evidence of fracture or dislocation. There is no evidence of arthropathy or other focal bone abnormality. Soft tissues are unremarkable. IMPRESSION: Negative. Electronically Signed   By: Ellery Plunkaniel R Mitchell M.D.   On: 06/19/2016 02:33    Procedures Procedures (including critical care time)  Medications Ordered in ED Medications  acetaminophen (TYLENOL)  325 MG tablet (not administered)  acetaminophen (TYLENOL) tablet 650 mg (650 mg Oral Given 06/19/16 0005)     Initial Impression / Assessment and Plan / ED Course  I have reviewed the triage vital signs and the nursing notes.  Pertinent labs & imaging results that were available during my care of the patient were reviewed by me and considered in my medical decision making (see chart for details).  Clinical Course   1. Left 7th and 8th rib fractures  Patient presents for evaluation of back pain after a fall earlier tonight. She is found to have fractures of ribs 7 and 8, mild displacement, negative PTX. Urine is negative for blood. Suspect injuries limited to rib fractures.   Incentive spirometer provided. Will management pain with Percocet - listed on her allergy list but only causes N/V.   Final Clinical Impressions(s) / ED Diagnoses   Final diagnoses:  None    New Prescriptions New Prescriptions   No medications on file     Elpidio AnisShari Talita Recht, PA-C 06/19/16 [id number]    Shon Batonourtney F Horton, MD 06/20/16 [phone]

## 2016-06-21 ENCOUNTER — Other Ambulatory Visit: Payer: Self-pay | Admitting: Neurology

## 2016-06-29 DIAGNOSIS — W010XXD Fall on same level from slipping, tripping and stumbling without subsequent striking against object, subsequent encounter: Secondary | ICD-10-CM | POA: Diagnosis not present

## 2016-06-29 DIAGNOSIS — S2242XD Multiple fractures of ribs, left side, subsequent encounter for fracture with routine healing: Secondary | ICD-10-CM | POA: Diagnosis not present

## 2016-07-09 ENCOUNTER — Encounter: Payer: Self-pay | Admitting: Neurology

## 2016-07-10 ENCOUNTER — Other Ambulatory Visit: Payer: Self-pay | Admitting: Neurology

## 2016-07-11 ENCOUNTER — Other Ambulatory Visit: Payer: Self-pay | Admitting: Neurology

## 2016-07-17 DIAGNOSIS — G35 Multiple sclerosis: Secondary | ICD-10-CM | POA: Diagnosis not present

## 2016-07-17 DIAGNOSIS — J4520 Mild intermittent asthma, uncomplicated: Secondary | ICD-10-CM | POA: Diagnosis not present

## 2016-07-17 DIAGNOSIS — I10 Essential (primary) hypertension: Secondary | ICD-10-CM | POA: Diagnosis not present

## 2016-07-17 DIAGNOSIS — S2242XA Multiple fractures of ribs, left side, initial encounter for closed fracture: Secondary | ICD-10-CM | POA: Diagnosis not present

## 2016-07-30 ENCOUNTER — Ambulatory Visit: Payer: Federal, State, Local not specified - PPO | Admitting: Neurology

## 2016-08-09 ENCOUNTER — Encounter: Payer: Self-pay | Admitting: Physical Therapy

## 2016-08-09 NOTE — Therapy (Signed)
[facility] [address] [place], [place], [address] Phone: [phone]   Fax:  [phone]  Patient Details  Name: Catherine Hubbard MRN: [id number] Date of Birth: 1961/02/05 Referring Provider:  Arlice Colt, MD  Encounter Date: 08/09/2016  PHYSICAL THERAPY DISCHARGE SUMMARY  Visits from Start of Care: 4  Current functional level related to goals / functional outcomes:     PT Long Term Goals - 11/28/15 1153      PT LONG TERM GOAL #1   Title Patient verbalizes & demonstrates understanding of ongoing HEP / fitness plan. (Target Date: 01/27/2016)   Time 10   Period Weeks   Status On-going     PT LONG TERM GOAL #2   Title Berg Balance > 45/56 to indicate lower fall risk.  (Target Date: 01/27/2016)   Time 10   Period Weeks   Status On-going     PT LONG TERM GOAL #3   Title Functional Gait Assessment >/= 19/30 to indicate fall risk.  (Target Date: 01/27/2016)   Time 10   Period Weeks   Status On-going     PT LONG TERM GOAL #4   Title Patient ambulates 1000' including grass, ramps, curbs without device independently to enable community access.  (Target Date: 01/27/2016)   Time 10   Period Weeks   Status On-going     PT LONG TERM GOAL #5   Title Patient verbalizes energy expenditure & Multiple Sclerosis recommendations for activity tolerance.  (Target Date: 01/27/2016)   Time 10   Period Weeks   Status On-going        Remaining deficits: Patient did not return so unknown deficits.   Education / Equipment: HEP Plan: Patient agrees to discharge.  Patient goals were not met. Patient is being discharged due to not returning since the last visit.  ?????         Jaquitta Dupriest PT, DPT 08/09/2016, 12:12 PM  [facility] [address] [place], [place], [address] Phone: [phone]   Fax:  [phone]

## 2016-08-10 DIAGNOSIS — M5417 Radiculopathy, lumbosacral region: Secondary | ICD-10-CM | POA: Diagnosis not present

## 2016-08-10 DIAGNOSIS — M9903 Segmental and somatic dysfunction of lumbar region: Secondary | ICD-10-CM | POA: Diagnosis not present

## 2016-08-10 DIAGNOSIS — M9904 Segmental and somatic dysfunction of sacral region: Secondary | ICD-10-CM | POA: Diagnosis not present

## 2016-08-10 DIAGNOSIS — M5136 Other intervertebral disc degeneration, lumbar region: Secondary | ICD-10-CM | POA: Diagnosis not present

## 2016-08-15 ENCOUNTER — Telehealth: Payer: Self-pay | Admitting: Neurology

## 2016-08-15 NOTE — Telephone Encounter (Signed)
Pt request refill for Interferon Beta-1a (REBIF) 22 MCG/0.5ML SOSY sent to [facility] (she does not remember the name)

## 2016-08-28 ENCOUNTER — Telehealth: Payer: Self-pay | Admitting: Neurology

## 2016-08-28 MED ORDER — INTERFERON BETA-1A 22 MCG/0.5ML ~~LOC~~ SOSY: 22.0000 ug | PREFILLED_SYRINGE | SUBCUTANEOUS | 3 refills | Status: DC

## 2016-08-28 NOTE — Telephone Encounter (Signed)
Rebif escribed to [facility] as requested/fim

## 2016-08-28 NOTE — Telephone Encounter (Signed)
Catherine Hubbard with [facility] is calling in reference to Interferon Beta-1a (REBIF) 22 MCG/0.5ML SOSY needing clarification on dosage.  Order needs to be faxed to [facility] [phone]

## 2016-08-28 NOTE — Addendum Note (Signed)
Addended by: Candis Schatz I on: 08/28/2016 04:19 PM   Modules accepted: Orders

## 2016-09-04 ENCOUNTER — Other Ambulatory Visit: Payer: Self-pay | Admitting: Neurology

## 2016-09-10 ENCOUNTER — Ambulatory Visit (INDEPENDENT_AMBULATORY_CARE_PROVIDER_SITE_OTHER): Payer: Federal, State, Local not specified - PPO | Admitting: Neurology

## 2016-09-10 ENCOUNTER — Other Ambulatory Visit: Payer: Self-pay | Admitting: *Deleted

## 2016-09-10 ENCOUNTER — Encounter: Payer: Self-pay | Admitting: Neurology

## 2016-09-10 VITALS — BP 112/84 | HR 78 | Resp 16 | Ht 65.0 in | Wt 160.0 lb

## 2016-09-10 DIAGNOSIS — Z79899 Other long term (current) drug therapy: Secondary | ICD-10-CM | POA: Diagnosis not present

## 2016-09-10 DIAGNOSIS — R200 Anesthesia of skin: Secondary | ICD-10-CM | POA: Diagnosis not present

## 2016-09-10 DIAGNOSIS — Z9884 Bariatric surgery status: Secondary | ICD-10-CM | POA: Diagnosis not present

## 2016-09-10 DIAGNOSIS — G35 Multiple sclerosis: Secondary | ICD-10-CM | POA: Diagnosis not present

## 2016-09-10 DIAGNOSIS — F418 Other specified anxiety disorders: Secondary | ICD-10-CM

## 2016-09-10 DIAGNOSIS — R260 Ataxic gait: Secondary | ICD-10-CM | POA: Diagnosis not present

## 2016-09-10 DIAGNOSIS — G2581 Restless legs syndrome: Secondary | ICD-10-CM | POA: Diagnosis not present

## 2016-09-10 MED ORDER — HYDROCODONE-ACETAMINOPHEN 5-325 MG PO TABS: 1.0000 | ORAL_TABLET | Freq: Every day | ORAL | 0 refills | Status: DC | PRN

## 2016-09-10 MED ORDER — ETODOLAC 400 MG PO TABS: 400.0000 mg | ORAL_TABLET | Freq: Two times a day (BID) | ORAL | 5 refills | Status: DC

## 2016-09-10 MED ORDER — DULOXETINE HCL 60 MG PO CPEP: 60.0000 mg | ORAL_CAPSULE | Freq: Every day | ORAL | 11 refills | Status: DC

## 2016-09-10 MED ORDER — INTERFERON BETA-1A 44 MCG/0.5ML ~~LOC~~ SOAJ: 44.0000 ug | SUBCUTANEOUS | 3 refills | Status: DC

## 2016-09-10 NOTE — Progress Notes (Signed)
[facility]  PATIENT: Catherine Hubbard DOB: 11-08-1960   _________________________________   HISTORICAL  CHIEF COMPLAINT:  Chief Complaint  Patient presents with  . Multiple Sclerosis    Sts. has been out of Rebif since 08-17-16.  Thinks left leg is weaker since then./fim    HISTORY OF PRESENT ILLNESS:  Catherine Hubbard is a 56 year old woman with MS.    MS:   She is on Rebif and tolerates it well.    However, her last injection was 08/17/2016 and she has not yet gotten refill.  She denies any definite exacerbations but the left leg feels weaker.   Gait/strength/sensation:   Gait is about the same but she feels the left leg is doing worse than least visit (right leg continues to bother her more).   She has spasticity in both legs, worse on the right and she sometimes gets spasms that are worse at night.      Sometimes she feels like she is being pushed to the right as she walks.   She has tingling in her hands and face.  Baclofen has helped spasticity some.   Cyclobenzaprine has not helped spasms.    LBP/ Costochondritis:   LBP and hip pain are better after she received injections at the last visit.   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   Tramadol did not help much.   She has costochondritis, worse with cough.   Last visit a Toradol sot helped a few days.    Motrin did not help  Bladder:   She feels bladder function is stable.,   No recent UTI    At baseline, she has mild urinary frequency.    She has occasional incomplete emptying.   She has occasional nocturia.  Vision/swallow:   She feels this is stable.   She has diplopia laying on either side but not when sitting up.   Also some she reads a while or when very tired.   She notes dysphagia, especially with 'sticky' pills like fish oil.  Mood/EtOH/Cognition:   Ddepression and anxiety are worse since the last visit.    She is on Wellbutrin and Prozac.   She  still sometimes feels mildly depressed.     She has some apathy.   She drinks less alcohol than last year   She has mild difficulty with cognition, mostly occasional difficulty with short-term memory and verbal fluency (often gets syllables reversed).  She has trouble completing complicated tasks.  .  Fatigue:   She has fatigue and takes Provigil but it is helping less .  She was tried on Ritalin or Adderall once but she did not like the way that she felt when she took it.    Sleep:   She is sleeping poorly due to more pain.   She has some RLS but has not taken anything in maonths for it.   She notes difficulty falling asleep more than with sleep maintenance.   MS History:   She was diagnosed with multiple sclerosis in 2009. Before that time, she had noted some gait ataxia but had not had any imaging studies. In 2009, due to progressive hearing loss, she had an MRI of the brain and  then had a lumbar puncture that also confirmed the diagnosis.  I have some of her early MRI reports there are several foci noted in the cervical and thoracic spinal cord in the brain, there were  multiple hyperintense foci, mostly in the periventricular deep white matter many of these were contrast enhancing on 07/26/2008. There was another enhancing lesion adjacent to C2 in the spinal cord.  Initially, she was placed on Avonex. She did not feel good and she stopped. She then tried Copaxone but also stopped after a while. She moved here in 2014 and saw Dr. Renne Crigler at [facility]. She was placed on Rebif. She tolerates Rebif well.      MRI reports from 12/04/2012  showed multiple foci in the spinal cord at C2, C3-C4, and C4-C5. There were also foci at T4, T7, T9 and T10. The MRI of the brain showed 2 nonenhancing foci not present from studies in 2011. She reports having an MRI at [facility] 2015 but we do not have those films.   REVIEW OF SYSTEMS: Constitutional: No fevers, chills, sweats, or change in appetite.    She reports  fatigue and insomnia Eyes: see above.  No eye pain  But some light sensitivity. Ear, nose and throat: No hearing loss, ear pain, nasal congestion, sore throat.   tinnitus Cardiovascular: No chest pain, palpitations Respiratory: No shortness of breath at rest or with exertion.   Some wheezes, once on asthma med's Gastrointestinal: No nausea, vomiting, diarrhea, abdominal pain, fecal incontinence. Genitourinary: see above Musculoskeletal: see above  She also notes neck pain Integumentary: No rash, pruritus, skin lesions Neurological: as above Psychiatric: as above Endocrine: No palpitations, diaphoresis, change in appetite, change in weigh or increased thirst.  Often feels cold Hematologic/Lymphatic: No anemia, purpura, petechiae. Allergic/Immunologic: No itchy/runny eyes, nasal congestion, recent allergic reactions, rashes  ALLERGIES: Allergies  Allergen Reactions  . 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 Percocet [Oxycodone-Acetaminophen] Nausea And Vomiting    Tolerates with nausea medication    HOME MEDICATIONS:  Current Outpatient Prescriptions:  .  albuterol (PROVENTIL HFA;VENTOLIN HFA) 108 (90 Base) MCG/ACT inhaler, Inhale 2 puffs into the lungs every 6 (six) hours as needed for wheezing or shortness of breath., Disp: , Rfl:  .  baclofen (LIORESAL) 10 MG tablet, Take 1 tablet (10 mg total) by mouth 3 (three) times daily., Disp: 90 each, Rfl: 11 .  buPROPion (WELLBUTRIN XL) 300 MG 24 hr tablet, TAKE 1 TABLET (300 MG TOTAL) BY MOUTH DAILY. (Patient taking differently: Take 300 mg by mouth daily. ), Disp: 30 tablet, Rfl: 11 .  cyclobenzaprine (FLEXERIL) 5 MG tablet, Take 5 mg by mouth every 8 (eight) hours as needed for muscle spasms., Disp: , Rfl:  .  diazepam (VALIUM) 5 MG tablet, TAKE 1 TABLET BY MOUTH EVERY 12 HOURS AS NEEDED MUSCLE SPASMS, Disp: 30 tablet, Rfl: 1 .  nabumetone (RELAFEN) 500 MG tablet, Take 1 tablet (500 mg total) by mouth 2  (two) times daily., Disp: 60 tablet, Rfl: 2 .  valsartan (DIOVAN) 80 MG tablet, Take 80 mg by mouth daily., Disp: , Rfl:  .  DULoxetine (CYMBALTA) 60 MG capsule, Take 1 capsule (60 mg total) by mouth daily., Disp: 30 capsule, Rfl: 11 .  etodolac (LODINE) 400 MG tablet, Take 1 tablet (400 mg total) by mouth 2 (two) times daily., Disp: 60 tablet, Rfl: 5 .  HYDROcodone-acetaminophen (NORCO) 5-325 MG tablet, Take 1 tablet by mouth daily as needed for moderate pain., Disp: 30 tablet, Rfl: 0 .  Interferon Beta-1a (REBIF REBIDOSE) 44 MCG/0.5ML SOAJ, Inject 44 mcg into the skin 3 (three) times a week., Disp: 36 Syringe, Rfl: 3  PAST  MEDICAL HISTORY: Past Medical History:  Diagnosis Date  . Asthma   . Headache   . Hearing loss   . Hypertension   . Multiple sclerosis (HCC)   . Vision abnormalities     PAST SURGICAL HISTORY: Past Surgical History:  Procedure Laterality Date  . ANKLE FRACTURE SURGERY Bilateral   . bariatric sleeve    . CARPAL TUNNEL RELEASE Bilateral   . ULNAR NERVE REPAIR Bilateral     FAMILY HISTORY: Family History  Problem Relation Age of Onset  . Cancer Mother   . Stroke Mother   . Cancer Father     SOCIAL HISTORY:  Social History   Social History  . Marital status: Married    Spouse name: N/A  . Number of children: N/A  . Years of education: N/A   Occupational History  . Not on file.   Social History Main Topics  . Smoking status: Former Games developer  . Smokeless tobacco: Never Used  . Alcohol use 0.0 oz/week     Comment: Sts. she drinks beer, liquor daily, varying amts/fim  . Drug use: No  . Sexual activity: No   Other Topics Concern  . Not on file   Social History Narrative  . No narrative on file     PHYSICAL EXAM  Vitals:   09/10/16 1114  BP: 112/84  Pulse: 78  Resp: 16  Weight: 160 lb (72.6 kg)  Height: 5\' 5"  (1.651 m)    Body mass index is 26.63 kg/m.   General: The patient is well-developed and well-nourished and in no acute  distress  Neck/HEENT:  The neck is mildly tender.  Good ROM  In neck.       Musculoskeletal:   Moderately severe sternal/costochondral pain on the right.  Mild lumbar tenderness.    Shoulders with good ROM.  Shoulders nontender  Neurologic Exam  Mental status: The patient is alert and oriented x 3 at the time of the examination. The patient has apparent normal recent and remote memory, with an apparently normal attention span and concentration ability.   Speech is normal.  Cranial nerves: Extraocular movements are full.  . There is reduced right facial sensation to soft touch. Facial strength is normal.  Trapezius and sternocleidomastoid strength is normal. No dysarthria is noted.    She has reduced hearing on the right and Weber lateralizes right.  .  Motor:  Muscle bulk is normal.   Tone is normal. Strength is  5 / 5 in all 4 extremities.   Sensory:  She has symmetric sensation to touch and vibration in all 4 limbs.   Coordination: Cerebellar testing reveals good finger-nose-finger  bilaterally.  Gait and station: Station is normal.   Gait is minimally wide and arthritic. Tandem gait is mildly wide.   Reflexes: Deep tendon reflexes are symmetric and mildly increased in legs bilaterally.       DIAGNOSTIC DATA (LABS, IMAGING, TESTING) - I reviewed patient records, labs, notes, testing and imaging myself where available.     ASSESSMENT AND PLAN  Multiple sclerosis (HCC)  Ataxic gait  High risk medication use  Gastric bypass status for obesity  Depression with anxiety  Restless leg  Numbness   1.    Continue  Rebif.    Our office will contact EMD Serono to try to get next shipment rapidly 2.    Etodolac for costochondritis.  Add OTC pepcid bid 3.   Continue baclofen 10 mg po tid for spasticity.  Diazepam 5 mg po nightly .    Hydrocodone, up to one a day prn spasticity pain or LBP 4.   Change fluoxetine to duloxetine (continue bupropion) for mood. Try to cut alcohol  use down further  5.   She will return to see me in 4 months or sooner if she has new or worsening neurologic symptoms.  40 minutes face-to-face evaluation with greater than one half the time counseling or coordinating care about her multiple sclerosis and related symptoms.  Richard A. Epimenio Foot, MD, PhD 09/10/2016, 1:03 PM Certified in Neurology, Clinical Neurophysiology, Sleep Medicine, Pain Medicine and Neuroimaging  [facility] [address] [place], [place] [address] [phone]   .

## 2016-09-13 ENCOUNTER — Ambulatory Visit: Payer: Federal, State, Local not specified - PPO | Admitting: Neurology

## 2016-09-19 ENCOUNTER — Encounter: Payer: Self-pay | Admitting: *Deleted

## 2016-09-20 ENCOUNTER — Telehealth: Payer: Self-pay | Admitting: Neurology

## 2016-09-20 NOTE — Telephone Encounter (Signed)
I have spoken with Catherine Hubbard this morning and advised that pt. does not need to retitrate/fim

## 2016-09-20 NOTE — Telephone Encounter (Signed)
Victorino Dike with [facility] called in reference to patients Interferon Beta-1a (REBIF REBIDOSE) 44 MCG/0.5ML SOAJ has questions if patient is needing to re-titrate due to being off medication since December.  Please call

## 2016-09-23 ENCOUNTER — Other Ambulatory Visit: Payer: Self-pay | Admitting: Neurology

## 2016-10-16 DIAGNOSIS — M9903 Segmental and somatic dysfunction of lumbar region: Secondary | ICD-10-CM | POA: Diagnosis not present

## 2016-10-16 DIAGNOSIS — M5136 Other intervertebral disc degeneration, lumbar region: Secondary | ICD-10-CM | POA: Diagnosis not present

## 2016-10-16 DIAGNOSIS — M5417 Radiculopathy, lumbosacral region: Secondary | ICD-10-CM | POA: Diagnosis not present

## 2016-10-16 DIAGNOSIS — M9904 Segmental and somatic dysfunction of sacral region: Secondary | ICD-10-CM | POA: Diagnosis not present

## 2016-10-19 ENCOUNTER — Other Ambulatory Visit: Payer: Self-pay | Admitting: Neurology

## 2016-10-22 ENCOUNTER — Emergency Department (HOSPITAL_COMMUNITY): Payer: Federal, State, Local not specified - PPO

## 2016-10-22 ENCOUNTER — Encounter (HOSPITAL_COMMUNITY): Payer: Self-pay | Admitting: Oncology

## 2016-10-22 ENCOUNTER — Emergency Department (HOSPITAL_COMMUNITY)
Admission: EM | Admit: 2016-10-22 | Discharge: 2016-10-22 | Disposition: A | Discharge status: Home / Self Care | Payer: Federal, State, Local not specified - PPO | Attending: Emergency Medicine | Admitting: Emergency Medicine

## 2016-10-22 DIAGNOSIS — I10 Essential (primary) hypertension: Secondary | ICD-10-CM | POA: Diagnosis not present

## 2016-10-22 DIAGNOSIS — R0789 Other chest pain: Secondary | ICD-10-CM | POA: Diagnosis not present

## 2016-10-22 DIAGNOSIS — R1013 Epigastric pain: Secondary | ICD-10-CM | POA: Diagnosis not present

## 2016-10-22 DIAGNOSIS — R0781 Pleurodynia: Secondary | ICD-10-CM | POA: Diagnosis not present

## 2016-10-22 DIAGNOSIS — R072 Precordial pain: Secondary | ICD-10-CM | POA: Insufficient documentation

## 2016-10-22 DIAGNOSIS — Z87891 Personal history of nicotine dependence: Secondary | ICD-10-CM | POA: Diagnosis not present

## 2016-10-22 DIAGNOSIS — R1010 Upper abdominal pain, unspecified: Secondary | ICD-10-CM | POA: Diagnosis not present

## 2016-10-22 DIAGNOSIS — J45909 Unspecified asthma, uncomplicated: Secondary | ICD-10-CM | POA: Insufficient documentation

## 2016-10-22 DIAGNOSIS — Z79899 Other long term (current) drug therapy: Secondary | ICD-10-CM | POA: Diagnosis not present

## 2016-10-22 DIAGNOSIS — R109 Unspecified abdominal pain: Secondary | ICD-10-CM | POA: Diagnosis not present

## 2016-10-22 LAB — URINALYSIS, ROUTINE W REFLEX MICROSCOPIC
Bilirubin Urine: NEGATIVE
Glucose, UA: NEGATIVE mg/dL
Hgb urine dipstick: NEGATIVE
KETONES UR: NEGATIVE mg/dL
Nitrite: NEGATIVE
PH: 6 (ref 5.0–8.0)
Protein, ur: NEGATIVE mg/dL
SQUAMOUS EPITHELIAL / LPF: NONE SEEN
Specific Gravity, Urine: 1.002 — ABNORMAL LOW (ref 1.005–1.030)

## 2016-10-22 LAB — COMPREHENSIVE METABOLIC PANEL
ALBUMIN: 3.7 g/dL (ref 3.5–5.0)
ALT: 20 U/L (ref 14–54)
AST: 31 U/L (ref 15–41)
Alkaline Phosphatase: 104 U/L (ref 38–126)
Anion gap: 14 (ref 5–15)
BILIRUBIN TOTAL: 0.6 mg/dL (ref 0.3–1.2)
BUN: 10 mg/dL (ref 6–20)
CHLORIDE: 95 mmol/L — AB (ref 101–111)
CO2: 22 mmol/L (ref 22–32)
CREATININE: 0.52 mg/dL (ref 0.44–1.00)
Calcium: 9.3 mg/dL (ref 8.9–10.3)
GFR calc Af Amer: >60 mL/min (ref >60)
GLUCOSE: 98 mg/dL (ref 65–99)
POTASSIUM: 3.2 mmol/L — AB (ref 3.5–5.1)
Sodium: 131 mmol/L — ABNORMAL LOW (ref 135–145)
TOTAL PROTEIN: 6.8 g/dL (ref 6.5–8.1)

## 2016-10-22 LAB — LIPASE, BLOOD: Lipase: 33 U/L (ref 11–51)

## 2016-10-22 LAB — CBC
HEMATOCRIT: 34.1 % — AB (ref 36.0–46.0)
Hemoglobin: 11.6 g/dL — ABNORMAL LOW (ref 12.0–15.0)
MCH: 31.8 pg (ref 26.0–34.0)
MCHC: 34 g/dL (ref 30.0–36.0)
MCV: 93.4 fL (ref 78.0–100.0)
PLATELETS: 271 10*3/uL (ref 150–400)
RBC: 3.65 MIL/uL — ABNORMAL LOW (ref 3.87–5.11)
RDW: 12.3 % (ref 11.5–15.5)
WBC: 6.4 10*3/uL (ref 4.0–10.5)

## 2016-10-22 LAB — I-STAT BETA HCG BLOOD, ED (MC, WL, AP ONLY): I-stat hCG, quantitative: <5 m[IU]/mL (ref <5)

## 2016-10-22 MED ORDER — ONDANSETRON HCL 4 MG/2ML IJ SOLN
4.0000 mg | Freq: Once | INTRAMUSCULAR | Status: AC
  Administered 2016-10-22: 4 mg via INTRAVENOUS
  Filled 2016-10-22: qty 2

## 2016-10-22 MED ORDER — MORPHINE SULFATE (PF) 4 MG/ML IV SOLN
4.0000 mg | Freq: Once | INTRAVENOUS | Status: AC
  Administered 2016-10-22: 4 mg via INTRAVENOUS
  Filled 2016-10-22: qty 1

## 2016-10-22 MED ORDER — OXYCODONE-ACETAMINOPHEN 5-325 MG PO TABS: 1.0000 | ORAL_TABLET | Freq: Four times a day (QID) | ORAL | 0 refills | Status: DC | PRN

## 2016-10-22 MED ORDER — SODIUM CHLORIDE 0.9 % IV BOLUS (SEPSIS)
1000.0000 mL | Freq: Once | INTRAVENOUS | Status: AC
Start: 2016-10-22 — End: 2016-10-22
  Administered 2016-10-22: 1000 mL via INTRAVENOUS

## 2016-10-22 MED ORDER — IOPAMIDOL (ISOVUE-300) INJECTION 61%
INTRAVENOUS | Status: AC
  Administered 2016-10-22: 100 mL
  Filled 2016-10-22: qty 100

## 2016-10-22 MED ORDER — PANTOPRAZOLE SODIUM 40 MG PO TBEC: 40.0000 mg | DELAYED_RELEASE_TABLET | Freq: Every day | ORAL | 0 refills | Status: DC

## 2016-10-22 MED ORDER — ONDANSETRON HCL 4 MG PO TABS: 4.0000 mg | ORAL_TABLET | Freq: Three times a day (TID) | ORAL | 0 refills | Status: DC | PRN

## 2016-10-22 MED ORDER — POTASSIUM CHLORIDE CRYS ER 20 MEQ PO TBCR
40.0000 meq | EXTENDED_RELEASE_TABLET | Freq: Once | ORAL | Status: AC
  Administered 2016-10-22: 40 meq via ORAL
  Filled 2016-10-22: qty 2

## 2016-10-22 NOTE — ED Notes (Signed)
Patient transported to CT 

## 2016-10-22 NOTE — Discharge Instructions (Addendum)
There was evidence of inflammation to the esophagus. This will need to be evaluated further by a gastroenterologist. Call the number provided to set up an appointment. Please also follow-up with your primary care provider.  Avoid these things: Do not take any NSAIDs such as ibuprofen or naproxen, or medications such as aspirin. Avoid alcohol and tobacco use. Avoid spicy or irritating foods.  Pain management: May take Tylenol for pain. Alternatively, you may take Percocet for severe pain. Do not drive or perform other dangerous activities while taking the Percocet. Zofran: Take the Zofran, as needed, for nausea and vomiting.  Protonix: This medication is meant to provide protection to your stomach and esophagus. Take this medication daily, regardless of how you feel. This medication is best taken 20-30 minutes before your first meal of the day.   SEEK IMMEDIATE MEDICAL ATTENTION IF: The pain does not go away or becomes severe.  A temperature above 101 develops.  Repeated vomiting occurs (multiple episodes).  The pain becomes localized to portions of the abdomen. The right side could possibly be appendicitis. In an adult, the left lower portion of the abdomen could be colitis or diverticulitis.  Blood is being passed in stools or vomit (bright red or black tarry stools).  Return also if you develop chest pain, difficulty breathing, dizziness or fainting, or become confused, poorly responsive, or inconsolable (young children).

## 2016-10-22 NOTE — ED Notes (Signed)
Pt sleeping soundly at this time.  Will hold morphine and zofran until pt is awake.

## 2016-10-22 NOTE — ED Notes (Signed)
CT notified that pts HCG was resulted and negative

## 2016-10-22 NOTE — ED Notes (Signed)
Patient transported to X-ray 

## 2016-10-22 NOTE — ED Triage Notes (Signed)
Pt c/o upper abdominal pain that radiates around to her back.  Pt rates the pain 5/10.  +nausea.  Last BM yesterday and was normal.
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
## 2016-11-03 ENCOUNTER — Other Ambulatory Visit: Payer: Self-pay | Admitting: Neurology

## 2016-11-18 HISTORY — PX: ESOPHAGOGASTRODUODENOSCOPY: SHX1529

## 2016-11-18 HISTORY — PX: COLONOSCOPY: SHX174

## 2016-11-28 DIAGNOSIS — K8012 Calculus of gallbladder with acute and chronic cholecystitis without obstruction: Secondary | ICD-10-CM | POA: Insufficient documentation

## 2016-11-28 DIAGNOSIS — R1011 Right upper quadrant pain: Secondary | ICD-10-CM | POA: Diagnosis not present

## 2016-11-28 DIAGNOSIS — K429 Umbilical hernia without obstruction or gangrene: Secondary | ICD-10-CM | POA: Insufficient documentation

## 2016-11-28 DIAGNOSIS — R112 Nausea with vomiting, unspecified: Secondary | ICD-10-CM | POA: Diagnosis not present

## 2016-11-28 DIAGNOSIS — R300 Dysuria: Secondary | ICD-10-CM | POA: Diagnosis not present

## 2016-11-28 DIAGNOSIS — R1310 Dysphagia, unspecified: Secondary | ICD-10-CM | POA: Diagnosis not present

## 2016-11-28 DIAGNOSIS — R3915 Urgency of urination: Secondary | ICD-10-CM | POA: Diagnosis not present

## 2016-11-29 ENCOUNTER — Telehealth: Payer: Self-pay | Admitting: Neurology

## 2016-11-29 DIAGNOSIS — R3915 Urgency of urination: Secondary | ICD-10-CM | POA: Diagnosis not present

## 2016-11-29 DIAGNOSIS — R300 Dysuria: Secondary | ICD-10-CM | POA: Diagnosis not present

## 2016-11-29 MED ORDER — HYDROCODONE-ACETAMINOPHEN 5-325 MG PO TABS: 1.0000 | ORAL_TABLET | Freq: Every day | ORAL | 0 refills | Status: DC | PRN

## 2016-11-29 NOTE — Telephone Encounter (Signed)
Rx. awaiting RAS sig/fim 

## 2016-11-29 NOTE — Addendum Note (Signed)
Addended by: Candis Schatz I on: 11/29/2016 04:53 PM   Modules accepted: Orders

## 2016-11-29 NOTE — Telephone Encounter (Signed)
Pt said that she would like to see if Dr Epimenio Foot will give her another Rx for Vicodin

## 2016-11-29 NOTE — Telephone Encounter (Signed)
Pt requesting Faith gives her a call to discuss medications, pt mentioned wanting to know if some are available in a liquid form.  Pt said that she has been vomiting for some time now.

## 2016-11-29 NOTE — Telephone Encounter (Deleted)
error 

## 2016-11-29 NOTE — Telephone Encounter (Signed)
LMOM that [facility] will fill Hydrocodone. She may pick rx. up tomorrow am between 0900-1200/fim

## 2016-11-29 NOTE — Telephone Encounter (Signed)
error 

## 2016-11-30 DIAGNOSIS — R1084 Generalized abdominal pain: Secondary | ICD-10-CM | POA: Diagnosis not present

## 2016-11-30 DIAGNOSIS — R110 Nausea: Secondary | ICD-10-CM | POA: Diagnosis not present

## 2016-11-30 DIAGNOSIS — K8020 Calculus of gallbladder without cholecystitis without obstruction: Secondary | ICD-10-CM | POA: Diagnosis not present

## 2016-11-30 DIAGNOSIS — R1310 Dysphagia, unspecified: Secondary | ICD-10-CM | POA: Diagnosis not present

## 2016-11-30 NOTE — Telephone Encounter (Signed)
Rx. up front [facility]/fim 

## 2016-12-04 DIAGNOSIS — R1314 Dysphagia, pharyngoesophageal phase: Secondary | ICD-10-CM | POA: Diagnosis not present

## 2016-12-04 DIAGNOSIS — Z86010 Personal history of colonic polyps: Secondary | ICD-10-CM | POA: Diagnosis not present

## 2016-12-04 DIAGNOSIS — N390 Urinary tract infection, site not specified: Secondary | ICD-10-CM | POA: Diagnosis not present

## 2016-12-05 DIAGNOSIS — K8020 Calculus of gallbladder without cholecystitis without obstruction: Secondary | ICD-10-CM | POA: Diagnosis not present

## 2016-12-05 DIAGNOSIS — K8012 Calculus of gallbladder with acute and chronic cholecystitis without obstruction: Secondary | ICD-10-CM | POA: Diagnosis not present

## 2016-12-05 NOTE — Telephone Encounter (Signed)
error 

## 2016-12-08 DIAGNOSIS — M5417 Radiculopathy, lumbosacral region: Secondary | ICD-10-CM | POA: Diagnosis not present

## 2016-12-08 DIAGNOSIS — M9904 Segmental and somatic dysfunction of sacral region: Secondary | ICD-10-CM | POA: Diagnosis not present

## 2016-12-08 DIAGNOSIS — M9903 Segmental and somatic dysfunction of lumbar region: Secondary | ICD-10-CM | POA: Diagnosis not present

## 2016-12-08 DIAGNOSIS — M5136 Other intervertebral disc degeneration, lumbar region: Secondary | ICD-10-CM | POA: Diagnosis not present

## 2016-12-13 DIAGNOSIS — K5730 Diverticulosis of large intestine without perforation or abscess without bleeding: Secondary | ICD-10-CM | POA: Diagnosis not present

## 2016-12-13 DIAGNOSIS — D125 Benign neoplasm of sigmoid colon: Secondary | ICD-10-CM | POA: Diagnosis not present

## 2016-12-13 DIAGNOSIS — K648 Other hemorrhoids: Secondary | ICD-10-CM | POA: Diagnosis not present

## 2016-12-13 DIAGNOSIS — K2210 Ulcer of esophagus without bleeding: Secondary | ICD-10-CM | POA: Diagnosis not present

## 2016-12-13 DIAGNOSIS — Z1211 Encounter for screening for malignant neoplasm of colon: Secondary | ICD-10-CM | POA: Diagnosis not present

## 2016-12-13 DIAGNOSIS — K209 Esophagitis, unspecified: Secondary | ICD-10-CM | POA: Diagnosis not present

## 2016-12-13 DIAGNOSIS — R1314 Dysphagia, pharyngoesophageal phase: Secondary | ICD-10-CM | POA: Diagnosis not present

## 2016-12-13 DIAGNOSIS — Z86010 Personal history of colonic polyps: Secondary | ICD-10-CM | POA: Diagnosis not present

## 2016-12-17 DIAGNOSIS — K8020 Calculus of gallbladder without cholecystitis without obstruction: Secondary | ICD-10-CM | POA: Diagnosis not present

## 2017-01-05 ENCOUNTER — Other Ambulatory Visit: Payer: Self-pay | Admitting: Neurology

## 2017-01-08 ENCOUNTER — Ambulatory Visit: Payer: Federal, State, Local not specified - PPO | Admitting: Neurology

## 2017-01-08 DIAGNOSIS — K811 Chronic cholecystitis: Secondary | ICD-10-CM | POA: Diagnosis not present

## 2017-01-08 DIAGNOSIS — K8020 Calculus of gallbladder without cholecystitis without obstruction: Secondary | ICD-10-CM | POA: Diagnosis not present

## 2017-01-23 ENCOUNTER — Ambulatory Visit: Payer: Federal, State, Local not specified - PPO | Admitting: Neurology

## 2017-01-24 ENCOUNTER — Encounter: Payer: Self-pay | Admitting: Neurology

## 2017-01-29 ENCOUNTER — Emergency Department (HOSPITAL_COMMUNITY): Payer: Federal, State, Local not specified - PPO

## 2017-01-29 ENCOUNTER — Encounter (HOSPITAL_COMMUNITY): Payer: Self-pay

## 2017-01-29 ENCOUNTER — Inpatient Hospital Stay (HOSPITAL_COMMUNITY)
Admission: EM | Admit: 2017-01-29 | Discharge: 2017-02-04 | DRG: 391 | Disposition: A | Discharge status: Home / Self Care | Payer: Federal, State, Local not specified - PPO | Attending: Family Medicine | Admitting: Family Medicine

## 2017-01-29 DIAGNOSIS — E878 Other disorders of electrolyte and fluid balance, not elsewhere classified: Secondary | ICD-10-CM

## 2017-01-29 DIAGNOSIS — Z9884 Bariatric surgery status: Secondary | ICD-10-CM | POA: Diagnosis not present

## 2017-01-29 DIAGNOSIS — R1310 Dysphagia, unspecified: Secondary | ICD-10-CM | POA: Diagnosis not present

## 2017-01-29 DIAGNOSIS — F419 Anxiety disorder, unspecified: Secondary | ICD-10-CM | POA: Diagnosis present

## 2017-01-29 DIAGNOSIS — T18198A Other foreign object in esophagus causing other injury, initial encounter: Secondary | ICD-10-CM | POA: Diagnosis not present

## 2017-01-29 DIAGNOSIS — K2210 Ulcer of esophagus without bleeding: Secondary | ICD-10-CM | POA: Diagnosis present

## 2017-01-29 DIAGNOSIS — E43 Unspecified severe protein-calorie malnutrition: Secondary | ICD-10-CM | POA: Diagnosis not present

## 2017-01-29 DIAGNOSIS — M940 Chondrocostal junction syndrome [Tietze]: Secondary | ICD-10-CM | POA: Diagnosis present

## 2017-01-29 DIAGNOSIS — R1110 Vomiting, unspecified: Secondary | ICD-10-CM

## 2017-01-29 DIAGNOSIS — E860 Dehydration: Secondary | ICD-10-CM

## 2017-01-29 DIAGNOSIS — I10 Essential (primary) hypertension: Secondary | ICD-10-CM | POA: Diagnosis present

## 2017-01-29 DIAGNOSIS — G35 Multiple sclerosis: Secondary | ICD-10-CM | POA: Diagnosis not present

## 2017-01-29 DIAGNOSIS — K222 Esophageal obstruction: Secondary | ICD-10-CM | POA: Diagnosis not present

## 2017-01-29 DIAGNOSIS — Z87891 Personal history of nicotine dependence: Secondary | ICD-10-CM

## 2017-01-29 DIAGNOSIS — E876 Hypokalemia: Secondary | ICD-10-CM

## 2017-01-29 DIAGNOSIS — G8929 Other chronic pain: Secondary | ICD-10-CM | POA: Diagnosis present

## 2017-01-29 DIAGNOSIS — N179 Acute kidney failure, unspecified: Secondary | ICD-10-CM

## 2017-01-29 DIAGNOSIS — R1030 Lower abdominal pain, unspecified: Secondary | ICD-10-CM | POA: Diagnosis not present

## 2017-01-29 DIAGNOSIS — R109 Unspecified abdominal pain: Secondary | ICD-10-CM | POA: Diagnosis not present

## 2017-01-29 DIAGNOSIS — Z79899 Other long term (current) drug therapy: Secondary | ICD-10-CM

## 2017-01-29 DIAGNOSIS — X58XXXA Exposure to other specified factors, initial encounter: Secondary | ICD-10-CM | POA: Diagnosis not present

## 2017-01-29 DIAGNOSIS — N390 Urinary tract infection, site not specified: Secondary | ICD-10-CM

## 2017-01-29 DIAGNOSIS — N3090 Cystitis, unspecified without hematuria: Secondary | ICD-10-CM | POA: Diagnosis present

## 2017-01-29 DIAGNOSIS — Z6822 Body mass index (BMI) 22.0-22.9, adult: Secondary | ICD-10-CM

## 2017-01-29 DIAGNOSIS — H9190 Unspecified hearing loss, unspecified ear: Secondary | ICD-10-CM | POA: Diagnosis not present

## 2017-01-29 DIAGNOSIS — J45909 Unspecified asthma, uncomplicated: Secondary | ICD-10-CM | POA: Diagnosis present

## 2017-01-29 DIAGNOSIS — R51 Headache: Secondary | ICD-10-CM | POA: Diagnosis not present

## 2017-01-29 DIAGNOSIS — G43A1 Cyclical vomiting, intractable: Secondary | ICD-10-CM | POA: Diagnosis not present

## 2017-01-29 DIAGNOSIS — K210 Gastro-esophageal reflux disease with esophagitis: Secondary | ICD-10-CM | POA: Diagnosis not present

## 2017-01-29 DIAGNOSIS — R112 Nausea with vomiting, unspecified: Secondary | ICD-10-CM | POA: Diagnosis present

## 2017-01-29 DIAGNOSIS — Y92239 Unspecified place in hospital as the place of occurrence of the external cause: Secondary | ICD-10-CM | POA: Diagnosis not present

## 2017-01-29 DIAGNOSIS — T18108A Unspecified foreign body in esophagus causing other injury, initial encounter: Secondary | ICD-10-CM | POA: Diagnosis not present

## 2017-01-29 DIAGNOSIS — F329 Major depressive disorder, single episode, unspecified: Secondary | ICD-10-CM | POA: Diagnosis not present

## 2017-01-29 DIAGNOSIS — S2243XA Multiple fractures of ribs, bilateral, initial encounter for closed fracture: Secondary | ICD-10-CM | POA: Diagnosis not present

## 2017-01-29 HISTORY — DX: Esophagitis, unspecified without bleeding: K20.90

## 2017-01-29 HISTORY — DX: Esophagitis, unspecified: K20.9

## 2017-01-29 LAB — BASIC METABOLIC PANEL WITH GFR
Anion gap: 17 — ABNORMAL HIGH (ref 5–15)
BUN: 21 mg/dL — ABNORMAL HIGH (ref 6–20)
CO2: 27 mmol/L (ref 22–32)
Calcium: 9.7 mg/dL (ref 8.9–10.3)
Chloride: 90 mmol/L — ABNORMAL LOW (ref 101–111)
Creatinine, Ser: 1.46 mg/dL — ABNORMAL HIGH (ref 0.44–1.00)
GFR calc Af Amer: 45 mL/min — ABNORMAL LOW (ref >60)
GFR calc non Af Amer: 39 mL/min — ABNORMAL LOW (ref >60)
Glucose, Bld: 122 mg/dL — ABNORMAL HIGH (ref 65–99)
Potassium: 2.4 mmol/L — CL (ref 3.5–5.1)
Sodium: 134 mmol/L — ABNORMAL LOW (ref 135–145)

## 2017-01-29 LAB — HEPATIC FUNCTION PANEL
ALT: 13 U/L — ABNORMAL LOW (ref 14–54)
AST: 27 U/L (ref 15–41)
Albumin: 3.9 g/dL (ref 3.5–5.0)
Alkaline Phosphatase: 62 U/L (ref 38–126)
BILIRUBIN TOTAL: 1.2 mg/dL (ref 0.3–1.2)
Total Protein: 7.8 g/dL (ref 6.5–8.1)

## 2017-01-29 LAB — CBC
HCT: 45.2 % (ref 36.0–46.0)
Hemoglobin: 15.5 g/dL — ABNORMAL HIGH (ref 12.0–15.0)
MCH: 32.3 pg (ref 26.0–34.0)
MCHC: 34.3 g/dL (ref 30.0–36.0)
MCV: 94.2 fL (ref 78.0–100.0)
PLATELETS: 392 10*3/uL (ref 150–400)
RBC: 4.8 MIL/uL (ref 3.87–5.11)
RDW: 12.4 % (ref 11.5–15.5)
WBC: 7.4 10*3/uL (ref 4.0–10.5)

## 2017-01-29 LAB — CBG MONITORING, ED: GLUCOSE-CAPILLARY: 106 mg/dL — AB (ref 65–99)

## 2017-01-29 LAB — LIPASE, BLOOD: Lipase: 59 U/L — ABNORMAL HIGH (ref 11–51)

## 2017-01-29 LAB — MAGNESIUM: Magnesium: 1.7 mg/dL (ref 1.7–2.4)

## 2017-01-29 MED ORDER — SODIUM CHLORIDE 0.9 % IV BOLUS (SEPSIS)
1000.0000 mL | Freq: Once | INTRAVENOUS | Status: AC
  Administered 2017-01-29: 1000 mL via INTRAVENOUS

## 2017-01-29 MED ORDER — POTASSIUM CHLORIDE 10 MEQ/100ML IV SOLN
10.0000 meq | INTRAVENOUS | Status: AC
  Administered 2017-01-29 – 2017-01-30 (×3): 10 meq via INTRAVENOUS
  Filled 2017-01-29 (×3): qty 100

## 2017-01-29 NOTE — ED Notes (Signed)
Patient transported to X-ray 

## 2017-01-29 NOTE — ED Triage Notes (Signed)
Pt presents with 5 month h/o nausea and vomiting, reports x 3 days, she has had dizziness and lightheadedness with syncopal episode today while coming in from her garden.

## 2017-01-29 NOTE — ED Provider Notes (Signed)
[facility]-EMERGENCY DEPT Provider Note   CSN: [id number] Arrival date & time: 01/29/17  4098     History   Chief Complaint Chief Complaint  Patient presents with  . Emesis    HPI Catherine Hubbard is a 56 y.o. female.  HPI  Patient presents to ED for persistent nausea, vomiting and generalized weakness for the past 2 months and an episode of syncope earlier today. She states that she has been experiencing lightheadedness and dizziness. Her episode of syncope occurred when she was trying to sit on her couch and states that "found myself half on the floor." She has tried Zofran and scopolamine patch with no improvement in nausea. States that she has been trying to drink water but has still been having vomiting. She also complains of foul-smelling urine and dysuria for the past few months as well. States she is scheduled for an appointment with a urologist this month. She denies chest pain, trouble breathing, hemoptysis, leg swelling, bowel changes, vaginal complaints, head injury, vision changes.  Past Medical History:  Diagnosis Date  . Asthma   . Headache   . Hearing loss   . Hypertension   . Multiple sclerosis (HCC)   . Vision abnormalities     Patient Active Problem List   Diagnosis Date Noted  . Costochondritis 05/11/2016  . Trochanteric bursitis of both hips 03/28/2016  . Upper back pain 03/28/2016  . Bilateral low back pain with bilateral sciatica 07/13/2015  . Right hip pain 07/13/2015  . Lumbar radicular pain 03/21/2015  . Other fatigue 01/11/2015  . Dysesthesia 01/11/2015  . Urinary urgency 01/11/2015  . Multiple sclerosis (HCC) 10/06/2014  . Numbness 10/06/2014  . Ataxic gait 10/06/2014  . High risk medication use 10/06/2014  . Gastric bypass status for obesity 10/06/2014  . Cognitive changes 10/06/2014  . Depression with anxiety 10/06/2014  . Restless leg 10/06/2014  . Insomnia 10/06/2014  . Essential hypertension 10/06/2014  . Difficulty hearing 01/13/2014   . Other muscle spasm 01/13/2014    Past Surgical History:  Procedure Laterality Date  . ANKLE FRACTURE SURGERY Bilateral   . bariatric sleeve    . CARPAL TUNNEL RELEASE Bilateral   . ULNAR NERVE REPAIR Bilateral     OB History    No data available       Home Medications    Prior to Admission medications   Medication Sig Start Date End Date Taking? Authorizing Provider  albuterol (PROVENTIL HFA;VENTOLIN HFA) 108 (90 Base) MCG/ACT inhaler Inhale 2 puffs into the lungs every 6 (six) hours as needed for wheezing or shortness of breath.   Yes [provider]  buPROPion (WELLBUTRIN XL) 300 MG 24 hr tablet TAKE 1 TABLET BY MOUTH DAILY 11/05/16  Yes Sater, Pearletha Furl, MD  diazepam (VALIUM) 5 MG tablet TAKE 1 TABLET BY MOUTH EVERY 12 HOURS AS NEEDED MUSCLE SPASMS 10/22/16  Yes Sater, Pearletha Furl, MD  DULoxetine (CYMBALTA) 60 MG capsule Take 1 capsule (60 mg total) by mouth daily. 09/10/16  Yes Sater, Pearletha Furl, MD  HYDROcodone-acetaminophen (NORCO) 5-325 MG tablet Take 1 tablet by mouth daily as needed for moderate pain. 11/29/16  Yes Sater, Pearletha Furl, MD  Interferon Beta-1a (REBIF REBIDOSE) 44 MCG/0.5ML SOAJ Inject 44 mcg into the skin 3 (three) times a week. 09/10/16  Yes Sater, Pearletha Furl, MD  omeprazole (PRILOSEC) 20 MG capsule Take 40 mg by mouth 2 (two) times daily.   Yes [provider]  scopolamine (TRANSDERM-SCOP) 1 MG/3DAYS Place 1 patch onto  the skin every 3 (three) days.   Yes [provider]  valsartan (DIOVAN) 80 MG tablet Take 80 mg by mouth daily.   Yes [provider]  baclofen (LIORESAL) 10 MG tablet Take 1 tablet (10 mg total) by mouth 3 (three) times daily. Patient not taking: Reported on 01/29/2017 03/28/16   Sater, Pearletha Furl, MD  cyclobenzaprine (FLEXERIL) 5 MG tablet TAKE 1 TABLET BY MOUTH EVERY 8 HOURS AS NEEDED FOR MUSCLE SPASMS Patient not taking: Reported on 01/29/2017 09/24/16   Sater, Pearletha Furl, MD  etodolac (LODINE) 400 MG tablet Take 1  tablet (400 mg total) by mouth 2 (two) times daily. Patient not taking: Reported on 01/29/2017 09/10/16   Sater, Pearletha Furl, MD  modafinil (PROVIGIL) 200 MG tablet TAKE 1 TABLET BY MOUTH IN THE MORNING, AND TAKE 1 TABLET AT NOON Patient not taking: Reported on 01/29/2017 01/07/17   Sater, Pearletha Furl, MD  nabumetone (RELAFEN) 500 MG tablet Take 1 tablet (500 mg total) by mouth 2 (two) times daily. 05/11/16   Sater, Pearletha Furl, MD  ondansetron (ZOFRAN) 4 MG tablet Take 1 tablet (4 mg total) by mouth every 8 (eight) hours as needed for nausea or vomiting. Patient not taking: Reported on 01/29/2017 10/22/16   Arthor Captain, PA-C  oxyCODONE-acetaminophen (PERCOCET) 5-325 MG tablet Take 1-2 tablets by mouth every 6 (six) hours as needed. Patient not taking: Reported on 01/29/2017 10/22/16   Arthor Captain, PA-C  pantoprazole (PROTONIX) 40 MG tablet Take 1 tablet (40 mg total) by mouth daily. Take this medication 20-30 minutes before the first meal of the day. Patient not taking: Reported on 01/29/2017 10/22/16   Anselm Pancoast, PA-C    Family History Family History  Problem Relation Age of Onset  . Cancer Mother   . Stroke Mother   . Cancer Father     Social History Social History  Substance Use Topics  . Smoking status: Former Games developer  . Smokeless tobacco: Never Used  . Alcohol use 0.0 oz/week     Comment: Sts. she drinks beer, liquor daily, varying amts/fim     Allergies   Ibuprofen and Percocet [oxycodone-acetaminophen]   Review of Systems Review of Systems  Constitutional: Positive for appetite change, chills and fatigue. Negative for fever.  HENT: Negative for ear pain, rhinorrhea, sneezing and sore throat.   Eyes: Negative for photophobia and visual disturbance.  Respiratory: Positive for shortness of breath. Negative for cough, chest tightness and wheezing.   Cardiovascular: Negative for chest pain and palpitations.  Gastrointestinal: Positive for abdominal pain, nausea and vomiting.  Negative for blood in stool, constipation and diarrhea.  Genitourinary: Positive for dysuria, flank pain and hematuria. Negative for urgency.  Musculoskeletal: Negative for myalgias.  Skin: Negative for rash.  Neurological: Positive for syncope and light-headedness. Negative for dizziness, weakness, numbness and headaches.     Physical Exam Updated Vital Signs BP 125/84   Pulse 64   Temp 98.1 F (36.7 C) (Oral)   Resp 12   Ht 5\' 5"  (1.651 m)   Wt 61.2 kg (135 lb)   SpO2 100%   BMI 22.47 kg/m   Physical Exam  Constitutional: She appears well-developed and well-nourished. No distress.  HENT:  Head: Normocephalic and atraumatic.  Nose: Nose normal.  Dry mucous membranes  Eyes: Conjunctivae and EOM are normal. Right eye exhibits discharge. Left eye exhibits no discharge. No scleral icterus.  Neck: Normal range of motion. Neck supple.  Cardiovascular: Normal rate, regular rhythm, normal heart sounds  and intact distal pulses.  Exam reveals no gallop and no friction rub.   No murmur heard. Pulmonary/Chest: Effort normal and breath sounds normal. No respiratory distress.  Abdominal: Soft. Bowel sounds are normal. She exhibits no distension. There is tenderness (Suprapubic). There is no guarding.  Bilateral CVA tenderness  Musculoskeletal: Normal range of motion. She exhibits no edema.  Neurological: She is alert. She exhibits normal muscle tone. Coordination normal.  Skin: Skin is warm and dry. No rash noted.  Psychiatric: She has a normal mood and affect.  Nursing note and vitals reviewed.    ED Treatments / Results  Labs (all labs ordered are listed, but only abnormal results are displayed) Labs Reviewed  BASIC METABOLIC PANEL - Abnormal; Notable for the following:       Result Value   Sodium 134 (*)    Potassium 2.4 (*)    Chloride 90 (*)    Glucose, Bld 122 (*)    BUN 21 (*)    Creatinine, Ser 1.46 (*)    GFR calc non Af Amer 39 (*)    GFR calc Af Amer 45 (*)     Anion gap 17 (*)    All other components within normal limits  CBC - Abnormal; Notable for the following:    Hemoglobin 15.5 (*)    All other components within normal limits  URINALYSIS, ROUTINE W REFLEX MICROSCOPIC - Abnormal; Notable for the following:    Color, Urine AMBER (*)    APPearance CLOUDY (*)    Bilirubin Urine SMALL (*)    Ketones, ur 5 (*)    Protein, ur 100 (*)    Leukocytes, UA MODERATE (*)    Bacteria, UA MANY (*)    Squamous Epithelial / LPF 0-5 (*)    Non Squamous Epithelial 0-5 (*)    All other components within normal limits  LIPASE, BLOOD - Abnormal; Notable for the following:    Lipase 59 (*)    All other components within normal limits  HEPATIC FUNCTION PANEL - Abnormal; Notable for the following:    ALT 13 (*)    Bilirubin, Direct <0.1 (*)    All other components within normal limits  CBG MONITORING, ED - Abnormal; Notable for the following:    Glucose-Capillary 106 (*)    All other components within normal limits  URINE CULTURE  MAGNESIUM  POC URINE PREG, ED    EKG  EKG Interpretation  Date/Time:  Tuesday January 29 2017 19:18:26 EDT Ventricular Rate:  109 PR Interval:  144 QRS Duration: 90 QT Interval:  366 QTC Calculation: 492 R Axis:   85 Text Interpretation:  Sinus tachycardia Right atrial enlargement Borderline ECG Since last tracing rate faster nonspecific t wave changes Confirmed by Linwood Dibbles [phone]) on 01/29/2017 8:47:36 PM       Radiology Dg Chest 2 View  Result Date: 01/29/2017 CLINICAL DATA:  Nausea and vomiting over the past 3 days with dizziness. Syncope. EXAM: CHEST  2 VIEW COMPARISON:  Multiple exams, including 10/22/2016 FINDINGS: Numerous old bilateral rib deformities related to healed fractures. Cardiac and mediastinal margins appear normal. The lungs appear clear. IMPRESSION: 1.  No active cardiopulmonary disease is radiographically apparent. 2. Multiple bilateral rib fractures. Electronically Signed   By: Gaylyn Rong  M.D.   On: 01/29/2017 21:16   Ct Abdomen Pelvis W Contrast  Result Date: 01/30/2017 CLINICAL DATA:  Abdominal pain. History of gastric bypass and multiple sclerosis. EXAM: CT ABDOMEN AND PELVIS WITH CONTRAST TECHNIQUE: Multidetector  CT imaging of the abdomen and pelvis was performed using the standard protocol following bolus administration of intravenous contrast. CONTRAST:  80mL ISOVUE-300 IOPAMIDOL (ISOVUE-300) INJECTION 61% COMPARISON:  CT abdomen and pelvis October 22, 2016 FINDINGS: LOWER CHEST: Lingular atelectasis/scarring. Heart size is normal. No pericardial effusions HEPATOBILIARY: Interval cholecystectomy. Similar focal fatty infiltration about the falciform ligament, liver is otherwise unremarkable. PANCREAS: Normal. SPLEEN: Normal. ADRENALS/URINARY TRACT: Kidneys are orthotopic, demonstrating symmetric enhancement. No nephrolithiasis, hydronephrosis or solid renal masses. The unopacified ureters are normal in course and caliber. Delayed imaging through the kidneys demonstrates symmetric prompt contrast excretion within the proximal urinary collecting system. Urinary bladder is decompressed with greater than expected wall thickening and intraluminal gas. Normal adrenal glands. STOMACH/BOWEL: Similar distal esophageal wall thickening, cyst status post gastric sleeve with small hiatal hernia. Moderate sigmoid diverticulosis. VASCULAR/LYMPHATIC: Aortoiliac vessels are normal in course and caliber. No lymphadenopathy by CT size criteria. REPRODUCTIVE: 2.8 cm homogeneously hypodense benign appearing RIGHT ovarian cyst, no indicated follow-up. OTHER: No intraperitoneal free fluid or free air. MUSCULOSKELETAL: Nonacute. Old Rib fracture deformities in the included chest. Old LEFT superior and inferior pubic rami fractures. Surgical clips LEFT inguinal soft tissues most compatible with prior vascular access. Small fat containing paraumbilical hernia. Mild scoliosis. Severe L5-S1 degenerative disc. IMPRESSION:  Bladder wall thickening concerning for cystitis with intraluminal gas, recommend correlation with instrumentation. No CT findings of pyelonephritis. Chronic esophageal wall thickening, status post gastric sleeve. Endoscopy again recommended on a nonemergent basis. Electronically Signed   By: Awilda Metro M.D.   On: 01/30/2017 01:44    Procedures Procedures (including critical care time)  Medications Ordered in ED Medications  iopamidol (ISOVUE-M) 61 % intrathecal injection 15 mL (not administered)  sodium chloride 0.9 % bolus 1,000 mL (0 mLs Intravenous Stopped 01/30/17 0024)  potassium chloride 10 mEq in 100 mL IVPB (10 mEq Intravenous New Bag/Given 01/30/17 0024)  iopamidol (ISOVUE-300) 61 % injection 80 mL (80 mLs Intravenous Contrast Given 01/30/17 0059)     Initial Impression / Assessment and Plan / ED Course  I have reviewed the triage vital signs and the nursing notes.  Pertinent labs & imaging results that were available during my care of the patient were reviewed by me and considered in my medical decision making (see chart for details).     Patient presents to ED for complaints of nausea and vomiting and generalized weakness for the past 2 months. She also had an episode of syncope earlier today. She states that despite her use of Zofran and scopolamine patch she continues to have vomiting. She reports generalized weakness and lightheadedness and less energy than usual. On physical exam patient is tender to palpation in the suprapubic region. CBC unremarkable. BMP revealed a potassium level of 2.4, an increase in creatinine to 1.46. Prior to this her most recent creatinine was 0.5 to approximately 3 months ago. This was repleted both orally and IV. Magnesium level normal. UA showed evidence of leukocytes and bacteria. CT of abdomen and pelvis was obtained and showed bladder wall thickening concerning for cystitis with intraluminal gas. Patient continues to have abdominal pain. We  will admit her for management of her AKI, nausea and vomiting and generalized weakness. Spoke to hospitalist Toniann Fail) who states that we should consult urology regarding the patient's CT findings. Care signed out to Harolyn Rutherford PA-C pending urology consult.  Final Clinical Impressions(s) / ED Diagnoses   Final diagnoses:  Acute kidney injury (HCC)  Dehydration  Lower abdominal pain  Lower urinary  tract infectious disease  Hypokalemia    New Prescriptions New Prescriptions   No medications on file     Dietrich Pates, Cordelia Poche 01/30/17 0226    Linwood Dibbles, MD 02/01/17 [phone]

## 2017-01-30 ENCOUNTER — Emergency Department (HOSPITAL_COMMUNITY): Payer: Federal, State, Local not specified - PPO

## 2017-01-30 ENCOUNTER — Encounter (HOSPITAL_COMMUNITY): Payer: Self-pay | Admitting: Internal Medicine

## 2017-01-30 DIAGNOSIS — I10 Essential (primary) hypertension: Secondary | ICD-10-CM

## 2017-01-30 DIAGNOSIS — N179 Acute kidney failure, unspecified: Secondary | ICD-10-CM

## 2017-01-30 DIAGNOSIS — R109 Unspecified abdominal pain: Secondary | ICD-10-CM | POA: Diagnosis not present

## 2017-01-30 DIAGNOSIS — R112 Nausea with vomiting, unspecified: Secondary | ICD-10-CM | POA: Diagnosis present

## 2017-01-30 DIAGNOSIS — E860 Dehydration: Secondary | ICD-10-CM

## 2017-01-30 DIAGNOSIS — E878 Other disorders of electrolyte and fluid balance, not elsewhere classified: Secondary | ICD-10-CM

## 2017-01-30 DIAGNOSIS — G43A1 Cyclical vomiting, intractable: Secondary | ICD-10-CM | POA: Diagnosis not present

## 2017-01-30 DIAGNOSIS — N3090 Cystitis, unspecified without hematuria: Secondary | ICD-10-CM | POA: Diagnosis not present

## 2017-01-30 DIAGNOSIS — E876 Hypokalemia: Secondary | ICD-10-CM

## 2017-01-30 LAB — RAPID URINE DRUG SCREEN, HOSP PERFORMED
AMPHETAMINES: NOT DETECTED
BENZODIAZEPINES: POSITIVE — AB
Barbiturates: NOT DETECTED
Cocaine: NOT DETECTED
OPIATES: NOT DETECTED
Tetrahydrocannabinol: NOT DETECTED

## 2017-01-30 LAB — URINALYSIS, ROUTINE W REFLEX MICROSCOPIC
GLUCOSE, UA: NEGATIVE mg/dL
Hgb urine dipstick: NEGATIVE
KETONES UR: 5 mg/dL — AB
Nitrite: NEGATIVE
PH: 5 (ref 5.0–8.0)
Protein, ur: 100 mg/dL — AB
SPECIFIC GRAVITY, URINE: 1.018 (ref 1.005–1.030)

## 2017-01-30 LAB — BASIC METABOLIC PANEL
Anion gap: 9 (ref 5–15)
BUN: 20 mg/dL (ref 6–20)
CO2: 27 mmol/L (ref 22–32)
CREATININE: 1.11 mg/dL — AB (ref 0.44–1.00)
Calcium: 8.7 mg/dL — ABNORMAL LOW (ref 8.9–10.3)
Chloride: 100 mmol/L — ABNORMAL LOW (ref 101–111)
GFR calc Af Amer: >60 mL/min (ref >60)
GFR, EST NON AFRICAN AMERICAN: 54 mL/min — AB (ref >60)
GLUCOSE: 63 mg/dL — AB (ref 65–99)
POTASSIUM: 3.2 mmol/L — AB (ref 3.5–5.1)
SODIUM: 136 mmol/L (ref 135–145)

## 2017-01-30 LAB — CREATININE, SERUM
Creatinine, Ser: 1.31 mg/dL — ABNORMAL HIGH (ref 0.44–1.00)
GFR calc non Af Amer: 45 mL/min — ABNORMAL LOW (ref >60)
GFR, EST AFRICAN AMERICAN: 52 mL/min — AB (ref >60)

## 2017-01-30 LAB — HIV ANTIBODY (ROUTINE TESTING W REFLEX): HIV Screen 4th Generation wRfx: NONREACTIVE

## 2017-01-30 LAB — POC URINE PREG, ED: PREG TEST UR: NEGATIVE

## 2017-01-30 LAB — CBC
HEMATOCRIT: 38.8 % (ref 36.0–46.0)
Hemoglobin: 13.3 g/dL (ref 12.0–15.0)
MCH: 32.3 pg (ref 26.0–34.0)
MCHC: 34.3 g/dL (ref 30.0–36.0)
MCV: 94.2 fL (ref 78.0–100.0)
Platelets: 290 10*3/uL (ref 150–400)
RBC: 4.12 MIL/uL (ref 3.87–5.11)
RDW: 12.3 % (ref 11.5–15.5)
WBC: 5.7 10*3/uL (ref 4.0–10.5)

## 2017-01-30 MED ORDER — ENOXAPARIN SODIUM 40 MG/0.4ML ~~LOC~~ SOLN
40.0000 mg | Freq: Every day | SUBCUTANEOUS | Status: DC
  Administered 2017-01-30 – 2017-01-31 (×2): 40 mg via SUBCUTANEOUS
  Filled 2017-01-30 (×2): qty 0.4

## 2017-01-30 MED ORDER — POTASSIUM CHLORIDE 10 MEQ/100ML IV SOLN
10.0000 meq | INTRAVENOUS | Status: AC
  Administered 2017-01-30 (×3): 10 meq via INTRAVENOUS
  Filled 2017-01-30 (×2): qty 100

## 2017-01-30 MED ORDER — DIAZEPAM 5 MG PO TABS
5.0000 mg | ORAL_TABLET | Freq: Two times a day (BID) | ORAL | Status: DC | PRN
  Administered 2017-02-03: 5 mg via ORAL
  Filled 2017-01-30: qty 1

## 2017-01-30 MED ORDER — SCOPOLAMINE 1 MG/3DAYS TD PT72
1.0000 | MEDICATED_PATCH | TRANSDERMAL | Status: DC
  Administered 2017-01-31 – 2017-02-03 (×2): 1.5 mg via TRANSDERMAL
  Filled 2017-01-30 (×2): qty 1

## 2017-01-30 MED ORDER — ALBUTEROL SULFATE (2.5 MG/3ML) 0.083% IN NEBU: 2.5000 mg | INHALATION_SOLUTION | Freq: Four times a day (QID) | RESPIRATORY_TRACT | Status: DC | PRN

## 2017-01-30 MED ORDER — IOPAMIDOL (ISOVUE-M 300) INJECTION 61%: 15.0000 mL | Freq: Once | INTRAMUSCULAR | Status: DC | PRN

## 2017-01-30 MED ORDER — BUPROPION HCL ER (XL) 150 MG PO TB24
300.0000 mg | ORAL_TABLET | Freq: Every day | ORAL | Status: DC
  Administered 2017-01-30 – 2017-02-03 (×5): 300 mg via ORAL
  Filled 2017-01-30 (×5): qty 2

## 2017-01-30 MED ORDER — HYDRALAZINE HCL 20 MG/ML IJ SOLN: 10.0000 mg | INTRAMUSCULAR | Status: DC | PRN

## 2017-01-30 MED ORDER — DULOXETINE HCL 60 MG PO CPEP
60.0000 mg | ORAL_CAPSULE | Freq: Every day | ORAL | Status: DC
  Administered 2017-01-30 – 2017-02-04 (×6): 60 mg via ORAL
  Filled 2017-01-30 (×6): qty 1

## 2017-01-30 MED ORDER — DEXTROSE 5 % IV SOLN
1.0000 g | Freq: Every day | INTRAVENOUS | Status: DC
  Administered 2017-01-30 – 2017-02-04 (×6): 1 g via INTRAVENOUS
  Filled 2017-01-30 (×7): qty 10

## 2017-01-30 MED ORDER — PANTOPRAZOLE SODIUM 40 MG PO TBEC
40.0000 mg | DELAYED_RELEASE_TABLET | Freq: Two times a day (BID) | ORAL | Status: DC
  Administered 2017-01-30: 40 mg via ORAL
  Filled 2017-01-30: qty 1

## 2017-01-30 MED ORDER — ONDANSETRON HCL 4 MG PO TABS: 4.0000 mg | ORAL_TABLET | Freq: Four times a day (QID) | ORAL | Status: DC | PRN

## 2017-01-30 MED ORDER — POTASSIUM CHLORIDE 10 MEQ/100ML IV SOLN
INTRAVENOUS | Status: AC
  Administered 2017-01-30: 10 meq
  Filled 2017-01-30: qty 100

## 2017-01-30 MED ORDER — ONDANSETRON HCL 4 MG/2ML IJ SOLN
4.0000 mg | Freq: Four times a day (QID) | INTRAMUSCULAR | Status: DC | PRN
  Administered 2017-01-30: 4 mg via INTRAVENOUS
  Filled 2017-01-30: qty 2

## 2017-01-30 MED ORDER — IOPAMIDOL (ISOVUE-300) INJECTION 61%
80.0000 mL | Freq: Once | INTRAVENOUS | Status: AC | PRN
  Administered 2017-01-30: 80 mL via INTRAVENOUS

## 2017-01-30 MED ORDER — POTASSIUM CHLORIDE IN NACL 20-0.9 MEQ/L-% IV SOLN
INTRAVENOUS | Status: AC
  Administered 2017-01-30 – 2017-01-31 (×2): via INTRAVENOUS
  Filled 2017-01-30 (×3): qty 1000

## 2017-01-30 MED ORDER — PANTOPRAZOLE SODIUM 40 MG PO TBEC
40.0000 mg | DELAYED_RELEASE_TABLET | Freq: Once | ORAL | Status: AC
  Administered 2017-01-30: 40 mg via ORAL

## 2017-01-30 MED ORDER — PANTOPRAZOLE SODIUM 40 MG PO TBEC
80.0000 mg | DELAYED_RELEASE_TABLET | Freq: Two times a day (BID) | ORAL | Status: DC
  Administered 2017-01-30 – 2017-01-31 (×3): 80 mg via ORAL
  Filled 2017-01-30 (×3): qty 2

## 2017-01-30 NOTE — Progress Notes (Signed)
I was asked to review the imaging by Harolyn Rutherford, PA-C at the time of admission at the request of the admitting team. Urinalysis suggested UTI and a CT was pursued to mainly evaluate GI system which revealed locules of air in the bladder.   I reviewed the CT imaging around 2:30AM and discussed it with Dr. Ronne Binning this morning. There are only a few tiny locules of possible intraluminal air. I do not suspect emphysematous cystitis based on this finding. Recommend routine treatment of UTI following cultures. If patient does not improve expectedly on antibiotics, at that time would recommend formal urology consultation, likely to include repeat imaging and foley catheterization.  Buck Mam, MD PGY4 Urologic Surgery

## 2017-01-30 NOTE — Progress Notes (Signed)
Patient seen and examined at bedside, patient admitted after midnight, please see earlier detailed admission note by Eduard Clos, MD. Briefly, patient presented with intractable nausea and vomiting with associated AKI and hypokalemia. IV fluids in addition to potassium given. Antiemetics as needed. Will consider a gastric emptying study if no improvement today. She is also being treated for a UTI.   Jacquelin Hawking, MD [facility] 01/30/2017, 12:11 PM Pager: [phone]

## 2017-01-30 NOTE — H&P (Addendum)
History and Physical    Catherine Hubbard GNF:[id number] DOB: 1960-09-29 DOA: 01/29/2017  PCP: Darrick Grinder, [facility]  Patient coming from: Home.  Chief Complaint: Nausea vomiting.  HPI: Catherine Hubbard is a 56 y.o. female with history of hypertension, asthma, multiple sclerosis presented to the ER because of worsening nausea vomiting. Patient states she has been having persistent nausea vomiting since February of this year. It started happening after she went to [place]. Patient had a cholecystectomy for the same reason last month despite which patient is still having vomiting. Patient vomits every time she eats. Denies any diarrhea. Has been having crampy epigastric abdominal pain along with vomiting. Since patient's symptoms acutely worsened patient came to the ER. Patient states she is originally scheduled for gastroenterology consult in 2 weeks from now with Dr. Lonna Cobb.   ED Course: In the ER labs reveal possible UTI with acute renal failure, hypokalemia with mildly elevated lipase. CT of the abdomen and pelvis shows cystitis with intraluminal air. ER physician had discussed with Dr. Judie Grieve on-call urologist who at this time advised no surgical indications. Patient is being admitted for persistent nausea vomiting with hypokalemia and acute renal failure.  Review of Systems: As per HPI, rest all negative.   Past Medical History:  Diagnosis Date  . Asthma   . Headache   . Hearing loss   . Hypertension   . Multiple sclerosis (HCC)   . Vision abnormalities     Past Surgical History:  Procedure Laterality Date  . ANKLE FRACTURE SURGERY Bilateral   . bariatric sleeve    . CARPAL TUNNEL RELEASE Bilateral   . ULNAR NERVE REPAIR Bilateral      reports that she has quit smoking. She has never used smokeless tobacco. She reports that she drinks alcohol. She reports that she does not use drugs.  Allergies  Allergen Reactions  . Ibuprofen Other (See  Comments)    Cannot take due to gastric bypass  . Percocet [Oxycodone-Acetaminophen] Nausea And Vomiting    Tolerates with nausea medication    Family History  Problem Relation Age of Onset  . Cancer Mother   . Stroke Mother   . Cancer Father     Prior to Admission medications   Medication Sig Start Date End Date Taking? Authorizing Provider  albuterol (PROVENTIL HFA;VENTOLIN HFA) 108 (90 Base) MCG/ACT inhaler Inhale 2 puffs into the lungs every 6 (six) hours as needed for wheezing or shortness of breath.   Yes [provider]  buPROPion (WELLBUTRIN XL) 300 MG 24 hr tablet TAKE 1 TABLET BY MOUTH DAILY 11/05/16  Yes Sater, Pearletha Furl, MD  diazepam (VALIUM) 5 MG tablet TAKE 1 TABLET BY MOUTH EVERY 12 HOURS AS NEEDED MUSCLE SPASMS 10/22/16  Yes Sater, Pearletha Furl, MD  DULoxetine (CYMBALTA) 60 MG capsule Take 1 capsule (60 mg total) by mouth daily. 09/10/16  Yes Sater, Pearletha Furl, MD  HYDROcodone-acetaminophen (NORCO) 5-325 MG tablet Take 1 tablet by mouth daily as needed for moderate pain. 11/29/16  Yes Sater, Pearletha Furl, MD  Interferon Beta-1a (REBIF REBIDOSE) 44 MCG/0.5ML SOAJ Inject 44 mcg into the skin 3 (three) times a week. 09/10/16  Yes Sater, Pearletha Furl, MD  omeprazole (PRILOSEC) 20 MG capsule Take 40 mg by mouth 2 (two) times daily.   Yes [provider]  scopolamine (TRANSDERM-SCOP) 1 MG/3DAYS Place 1 patch onto the skin every 3 (three) days.   Yes [provider]  valsartan (DIOVAN) 80 MG tablet Take 80 mg  by mouth daily.   Yes [provider]  baclofen (LIORESAL) 10 MG tablet Take 1 tablet (10 mg total) by mouth 3 (three) times daily. Patient not taking: Reported on 01/29/2017 03/28/16   Sater, Pearletha Furl, MD  cyclobenzaprine (FLEXERIL) 5 MG tablet TAKE 1 TABLET BY MOUTH EVERY 8 HOURS AS NEEDED FOR MUSCLE SPASMS Patient not taking: Reported on 01/29/2017 09/24/16   Sater, Pearletha Furl, MD  etodolac (LODINE) 400 MG tablet Take 1 tablet (400 mg total) by mouth 2 (two)  times daily. Patient not taking: Reported on 01/29/2017 09/10/16   Sater, Pearletha Furl, MD  modafinil (PROVIGIL) 200 MG tablet TAKE 1 TABLET BY MOUTH IN THE MORNING, AND TAKE 1 TABLET AT NOON Patient not taking: Reported on 01/29/2017 01/07/17   Sater, Pearletha Furl, MD  nabumetone (RELAFEN) 500 MG tablet Take 1 tablet (500 mg total) by mouth 2 (two) times daily. 05/11/16   Sater, Pearletha Furl, MD  ondansetron (ZOFRAN) 4 MG tablet Take 1 tablet (4 mg total) by mouth every 8 (eight) hours as needed for nausea or vomiting. Patient not taking: Reported on 01/29/2017 10/22/16   Arthor Captain, PA-C  oxyCODONE-acetaminophen (PERCOCET) 5-325 MG tablet Take 1-2 tablets by mouth every 6 (six) hours as needed. Patient not taking: Reported on 01/29/2017 10/22/16   Arthor Captain, PA-C  pantoprazole (PROTONIX) 40 MG tablet Take 1 tablet (40 mg total) by mouth daily. Take this medication 20-30 minutes before the first meal of the day. Patient not taking: Reported on 01/29/2017 10/22/16   Anselm Pancoast, PA-C    Physical Exam: Vitals:   01/30/17 0200 01/30/17 0230 01/30/17 0300 01/30/17 0346  BP: 127/80 135/87 114/68 (!) 135/91  Pulse: (!) 55 (!) 55 (!) 137 60  Resp: 15 13 12 16   Temp:    98 F (36.7 C)  TempSrc:    Oral  SpO2: 100% 100% 98% 100%  Weight:    60.9 kg (134 lb 4.8 oz)  Height:          Constitutional: Moderately built and nourished. Vitals:   01/30/17 0200 01/30/17 0230 01/30/17 0300 01/30/17 0346  BP: 127/80 135/87 114/68 (!) 135/91  Pulse: (!) 55 (!) 55 (!) 137 60  Resp: 15 13 12 16   Temp:    98 F (36.7 C)  TempSrc:    Oral  SpO2: 100% 100% 98% 100%  Weight:    60.9 kg (134 lb 4.8 oz)  Height:       Eyes: Anicteric no pallor. ENMT: No discharge from the ears eyes nose and mouth. Neck: No mass felt. No neck rigidity. Respiratory: No rhonchi or crepitations. Cardiovascular: S1-S2 heard no murmurs appreciated. Abdomen: Soft nontender bowel sounds present. Musculoskeletal: No edema. No joint  effusion. Skin: No rash. Skin appears warm. Neurologic: Alert awake oriented to time place and person. Moves all extremities. Psychiatric: Appears normal. Normal affect.   Labs on Admission: I have personally reviewed following labs and imaging studies  CBC:  Recent Labs Lab 01/29/17 1932  WBC 7.4  HGB 15.5*  HCT 45.2  MCV 94.2  PLT 392   Basic Metabolic Panel:  Recent Labs Lab 01/29/17 1932  NA 134*  K 2.4*  CL 90*  CO2 27  GLUCOSE 122*  BUN 21*  CREATININE 1.46*  CALCIUM 9.7  MG 1.7   GFR: Estimated Creatinine Clearance: 38.7 mL/min (A) (by C-G formula based on SCr of 1.46 mg/dL (H)). Liver Function Tests:  Recent Labs Lab 01/29/17 1932  AST 27  ALT 13*  ALKPHOS 62  BILITOT 1.2  PROT 7.8  ALBUMIN 3.9    Recent Labs Lab 01/29/17 1932  LIPASE 59*   No results for input(s): AMMONIA in the last 168 hours. Coagulation Profile: No results for input(s): INR, PROTIME in the last 168 hours. Cardiac Enzymes: No results for input(s): CKTOTAL, CKMB, CKMBINDEX, TROPONINI in the last 168 hours. BNP (last 3 results) No results for input(s): PROBNP in the last 8760 hours. HbA1C: No results for input(s): HGBA1C in the last 72 hours. CBG:  Recent Labs Lab 01/29/17 2002  GLUCAP 106*   Lipid Profile: No results for input(s): CHOL, HDL, LDLCALC, TRIG, CHOLHDL, LDLDIRECT in the last 72 hours. Thyroid Function Tests: No results for input(s): TSH, T4TOTAL, FREET4, T3FREE, THYROIDAB in the last 72 hours. Anemia Panel: No results for input(s): VITAMINB12, FOLATE, FERRITIN, TIBC, IRON, RETICCTPCT in the last 72 hours. Urine analysis:    Component Value Date/Time   COLORURINE AMBER (A) 01/30/2017 0017   APPEARANCEUR CLOUDY (A) 01/30/2017 0017   LABSPEC 1.018 01/30/2017 0017   PHURINE 5.0 01/30/2017 0017   GLUCOSEU NEGATIVE 01/30/2017 0017   HGBUR NEGATIVE 01/30/2017 0017   BILIRUBINUR SMALL (A) 01/30/2017 0017   KETONESUR 5 (A) 01/30/2017 0017   PROTEINUR  100 (A) 01/30/2017 0017   NITRITE NEGATIVE 01/30/2017 0017   LEUKOCYTESUR MODERATE (A) 01/30/2017 0017   Sepsis Labs: @LABRCNTIP (procalcitonin:4,lacticidven:4) )No results found for this or any previous visit (from the past 240 hour(s)).   Radiological Exams on Admission: Dg Chest 2 View  Result Date: 01/29/2017 CLINICAL DATA:  Nausea and vomiting over the past 3 days with dizziness. Syncope. EXAM: CHEST  2 VIEW COMPARISON:  Multiple exams, including 10/22/2016 FINDINGS: Numerous old bilateral rib deformities related to healed fractures. Cardiac and mediastinal margins appear normal. The lungs appear clear. IMPRESSION: 1.  No active cardiopulmonary disease is radiographically apparent. 2. Multiple bilateral rib fractures. Electronically Signed   By: Gaylyn Rong M.D.   On: 01/29/2017 21:16   Ct Abdomen Pelvis W Contrast  Result Date: 01/30/2017 CLINICAL DATA:  Abdominal pain. History of gastric bypass and multiple sclerosis. EXAM: CT ABDOMEN AND PELVIS WITH CONTRAST TECHNIQUE: Multidetector CT imaging of the abdomen and pelvis was performed using the standard protocol following bolus administration of intravenous contrast. CONTRAST:  80mL ISOVUE-300 IOPAMIDOL (ISOVUE-300) INJECTION 61% COMPARISON:  CT abdomen and pelvis October 22, 2016 FINDINGS: LOWER CHEST: Lingular atelectasis/scarring. Heart size is normal. No pericardial effusions HEPATOBILIARY: Interval cholecystectomy. Similar focal fatty infiltration about the falciform ligament, liver is otherwise unremarkable. PANCREAS: Normal. SPLEEN: Normal. ADRENALS/URINARY TRACT: Kidneys are orthotopic, demonstrating symmetric enhancement. No nephrolithiasis, hydronephrosis or solid renal masses. The unopacified ureters are normal in course and caliber. Delayed imaging through the kidneys demonstrates symmetric prompt contrast excretion within the proximal urinary collecting system. Urinary bladder is decompressed with greater than expected wall  thickening and intraluminal gas. Normal adrenal glands. STOMACH/BOWEL: Similar distal esophageal wall thickening, cyst status post gastric sleeve with small hiatal hernia. Moderate sigmoid diverticulosis. VASCULAR/LYMPHATIC: Aortoiliac vessels are normal in course and caliber. No lymphadenopathy by CT size criteria. REPRODUCTIVE: 2.8 cm homogeneously hypodense benign appearing RIGHT ovarian cyst, no indicated follow-up. OTHER: No intraperitoneal free fluid or free air. MUSCULOSKELETAL: Nonacute. Old Rib fracture deformities in the included chest. Old LEFT superior and inferior pubic rami fractures. Surgical clips LEFT inguinal soft tissues most compatible with prior vascular access. Small fat containing paraumbilical hernia. Mild scoliosis. Severe L5-S1 degenerative disc. IMPRESSION: Bladder wall thickening concerning for cystitis with  intraluminal gas, recommend correlation with instrumentation. No CT findings of pyelonephritis. Chronic esophageal wall thickening, status post gastric sleeve. Endoscopy again recommended on a nonemergent basis. Electronically Signed   By: Awilda Metro M.D.   On: 01/30/2017 01:44    EKG: Independently reviewed. Normal sinus rhythm with nonspecific ST-T changes.  Assessment/Plan Active Problems:   Essential hypertension   Nausea & vomiting   Hypokalemia   Acute kidney injury (HCC)   Cystitis    1. Intractable nausea vomiting - cause not clear. Lipase is mildly elevated but CAT scan does not show any definite evidence of pancreatitis. UA showing possible UTI which could cause his symptoms but has been going on for a few months now. If symptoms persist consider GI consult in a.m. For now we will keep patient on clear liquid diet and antiemetics and IV fluids. 2. Severe hypokalemia probably from vomiting - I have ordered for more doses of IV KCl following which please order a metabolic panel to recheck potassium and also check medication levels. 3. Cystitis with  intraluminal air - patient is placed on ceftriaxone and follow urine cultures. ER physician did discussed with urologist who advised at this time no surgical indications. 4. Acute kidney injury is secondary to vomiting. Will hold ARB for now. Continue hydration. Follow metabolic panel. 5. Hypertension - since patient has been having persistent vomiting and also acute renal failure we'll hold ARB and keep patient on when necessary IV hydralazine. 6. History of multiple sclerosis on Rebif. 7. History of depression on bupropion and Cymbalta.   DVT prophylaxis: Lovenox. Code Status: Full code.  Family Communication: Discussed patient.  Disposition Plan: Home.  Consults called: None.  Admission status: Observation.    Eduard Clos MD [facility] Pager [phone].  If 7PM-7AM, please contact night-coverage [email or web address] Password [email or web address]  01/30/2017, 3:54 AM

## 2017-01-30 NOTE — ED Provider Notes (Signed)
Took patient care handoff report from [facility], [place]. Patient is being admitted via hospitalist for intractable nausea and vomiting as well as signs of cystitis on UA. CT confirms cystitis, but also notes intraluminal gas. Hospitalist requested that we consult urology for their opinion.   Results for orders placed or performed during the hospital encounter of 01/29/17  Basic metabolic panel  Result Value Ref Range   Sodium 134 (L) 135 - 145 mmol/L   Potassium 2.4 (LL) 3.5 - 5.1 mmol/L   Chloride 90 (L) 101 - 111 mmol/L   CO2 27 22 - 32 mmol/L   Glucose, Bld 122 (H) 65 - 99 mg/dL   BUN 21 (H) 6 - 20 mg/dL   Creatinine, Ser 1.61 (H) 0.44 - 1.00 mg/dL   Calcium 9.7 8.9 - 09.6 mg/dL   GFR calc non Af Amer 39 (L) >60 mL/min   GFR calc Af Amer 45 (L) >60 mL/min   Anion gap 17 (H) 5 - 15  CBC  Result Value Ref Range   WBC 7.4 4.0 - 10.5 K/uL   RBC 4.80 3.87 - 5.11 MIL/uL   Hemoglobin 15.5 (H) 12.0 - 15.0 g/dL   HCT 04.5 40.9 - 81.1 %   MCV 94.2 78.0 - 100.0 fL   MCH 32.3 26.0 - 34.0 pg   MCHC 34.3 30.0 - 36.0 g/dL   RDW 91.4 78.2 - 95.6 %   Platelets 392 150 - 400 K/uL  Urinalysis, Routine w reflex microscopic  Result Value Ref Range   Color, Urine AMBER (A) YELLOW   APPearance CLOUDY (A) CLEAR   Specific Gravity, Urine 1.018 1.005 - 1.030   pH 5.0 5.0 - 8.0   Glucose, UA NEGATIVE NEGATIVE mg/dL   Hgb urine dipstick NEGATIVE NEGATIVE   Bilirubin Urine SMALL (A) NEGATIVE   Ketones, ur 5 (A) NEGATIVE mg/dL   Protein, ur 213 (A) NEGATIVE mg/dL   Nitrite NEGATIVE NEGATIVE   Leukocytes, UA MODERATE (A) NEGATIVE   RBC / HPF 0-5 0 - 5 RBC/hpf   WBC, UA TOO NUMEROUS TO COUNT 0 - 5 WBC/hpf   Bacteria, UA MANY (A) NONE SEEN   Squamous Epithelial / LPF 0-5 (A) NONE SEEN   WBC Clumps PRESENT    Mucous PRESENT    Hyaline Casts, UA PRESENT    Non Squamous Epithelial 0-5 (A) NONE SEEN  Lipase, blood  Result Value Ref Range   Lipase 59 (H) 11 - 51 U/L  Hepatic function panel  Result  Value Ref Range   Total Protein 7.8 6.5 - 8.1 g/dL   Albumin 3.9 3.5 - 5.0 g/dL   AST 27 15 - 41 U/L   ALT 13 (L) 14 - 54 U/L   Alkaline Phosphatase 62 38 - 126 U/L   Total Bilirubin 1.2 0.3 - 1.2 mg/dL   Bilirubin, Direct <0.8 (L) 0.1 - 0.5 mg/dL   Indirect Bilirubin NOT CALCULATED 0.3 - 0.9 mg/dL  Magnesium  Result Value Ref Range   Magnesium 1.7 1.7 - 2.4 mg/dL  CBG monitoring, ED  Result Value Ref Range   Glucose-Capillary 106 (H) 65 - 99 mg/dL  POC Urine Pregnancy, ED (do NOT order at [facility])  Result Value Ref Range   Preg Test, Ur NEGATIVE NEGATIVE   Dg Chest 2 View  Result Date: 01/29/2017 CLINICAL DATA:  Nausea and vomiting over the past 3 days with dizziness. Syncope. EXAM: CHEST  2 VIEW COMPARISON:  Multiple exams, including 10/22/2016 FINDINGS: Numerous old bilateral rib  deformities related to healed fractures. Cardiac and mediastinal margins appear normal. The lungs appear clear. IMPRESSION: 1.  No active cardiopulmonary disease is radiographically apparent. 2. Multiple bilateral rib fractures. Electronically Signed   By: Gaylyn Rong M.D.   On: 01/29/2017 21:16   Ct Abdomen Pelvis W Contrast  Result Date: 01/30/2017 CLINICAL DATA:  Abdominal pain. History of gastric bypass and multiple sclerosis. EXAM: CT ABDOMEN AND PELVIS WITH CONTRAST TECHNIQUE: Multidetector CT imaging of the abdomen and pelvis was performed using the standard protocol following bolus administration of intravenous contrast. CONTRAST:  19mL ISOVUE-300 IOPAMIDOL (ISOVUE-300) INJECTION 61% COMPARISON:  CT abdomen and pelvis October 22, 2016 FINDINGS: LOWER CHEST: Lingular atelectasis/scarring. Heart size is normal. No pericardial effusions HEPATOBILIARY: Interval cholecystectomy. Similar focal fatty infiltration about the falciform ligament, liver is otherwise unremarkable. PANCREAS: Normal. SPLEEN: Normal. ADRENALS/URINARY TRACT: Kidneys are orthotopic, demonstrating symmetric enhancement. No nephrolithiasis,  hydronephrosis or solid renal masses. The unopacified ureters are normal in course and caliber. Delayed imaging through the kidneys demonstrates symmetric prompt contrast excretion within the proximal urinary collecting system. Urinary bladder is decompressed with greater than expected wall thickening and intraluminal gas. Normal adrenal glands. STOMACH/BOWEL: Similar distal esophageal wall thickening, cyst status post gastric sleeve with small hiatal hernia. Moderate sigmoid diverticulosis. VASCULAR/LYMPHATIC: Aortoiliac vessels are normal in course and caliber. No lymphadenopathy by CT size criteria. REPRODUCTIVE: 2.8 cm homogeneously hypodense benign appearing RIGHT ovarian cyst, no indicated follow-up. OTHER: No intraperitoneal free fluid or free air. MUSCULOSKELETAL: Nonacute. Old Rib fracture deformities in the included chest. Old LEFT superior and inferior pubic rami fractures. Surgical clips LEFT inguinal soft tissues most compatible with prior vascular access. Small fat containing paraumbilical hernia. Mild scoliosis. Severe L5-S1 degenerative disc. IMPRESSION: Bladder wall thickening concerning for cystitis with intraluminal gas, recommend correlation with instrumentation. No CT findings of pyelonephritis. Chronic esophageal wall thickening, status post gastric sleeve. Endoscopy again recommended on a nonemergent basis. Electronically Signed   By: Awilda Metro M.D.   On: 01/30/2017 01:44    Clinical Course as of Jan 31 236  Wed Jan 30, 2017  0228 Spoke with Dr. Judie Grieve, urologist, who states that he will consult with the admitting team, if necessary. Admitting team can coordinate this consult in the morning as long as patient is stable.   [SJ]  0236 Dr. Toniann Fail, hospitalist, was updated.  [SJ]    Clinical Course User Index [SJ] Anselm Pancoast, [place]   Vitals:   01/29/17 2300 01/29/17 2330 01/30/17 0000 01/30/17 0130  BP: 128/87 121/86 126/81 125/84  Pulse: 66 73 64   Resp: 12 (!) 25 15  12   Temp:      TempSrc:      SpO2: (!) 86% 100% 100%   Weight:      Height:          Anselm Pancoast, PA-C 01/30/17 0237    Dione Booze, MD 01/30/17 [phone]

## 2017-01-30 NOTE — Progress Notes (Signed)
New Admission Note: Pt admitted to room 6E29 from [facility]  Arrival Method: via stretcher Mental Orientation: Alert and oriented x 4 Telemetry: Placed on box 29 Assessment: Completed Skin: Intact IV: R AC  Pain: Denies Tubes: None Safety Measures: Safety Fall Prevention Plan has been discussed  Admission: to be completed [address] [place] Orientation: Patient has been orientated to the room, unit and staff.  Family: None at bedside at this time  Orders to be reviewed and implemented. Will continue to monitor the patient. Call light has been placed within reach and bed alarm has been activated.   Burley Saver, BSN, RN-BC Phone: [phone]

## 2017-01-30 NOTE — Progress Notes (Signed)
Patient stated to this nurse that she was having increased episodes of "vomiting".  She was not having nausea prior to vomiting episodes.  Zofran was ineffective.  Regurgitation of large bolus of chewed food was accompanied by mod to large amts of belching.  It appears that patient is unable to pass bolus into the stomach for digestion.  Advised patient to hold off of further dietary intake until seen by MD in am.  MD notified.  Patient had soft stool x 1 accompanied by large amts of flatus.

## 2017-01-31 ENCOUNTER — Encounter (HOSPITAL_COMMUNITY): Payer: Self-pay | Admitting: Internal Medicine

## 2017-01-31 DIAGNOSIS — Z87891 Personal history of nicotine dependence: Secondary | ICD-10-CM | POA: Diagnosis not present

## 2017-01-31 DIAGNOSIS — R112 Nausea with vomiting, unspecified: Secondary | ICD-10-CM | POA: Diagnosis not present

## 2017-01-31 DIAGNOSIS — J45909 Unspecified asthma, uncomplicated: Secondary | ICD-10-CM | POA: Diagnosis present

## 2017-01-31 DIAGNOSIS — E860 Dehydration: Secondary | ICD-10-CM | POA: Diagnosis not present

## 2017-01-31 DIAGNOSIS — E876 Hypokalemia: Secondary | ICD-10-CM | POA: Diagnosis not present

## 2017-01-31 DIAGNOSIS — T18108A Unspecified foreign body in esophagus causing other injury, initial encounter: Secondary | ICD-10-CM | POA: Diagnosis not present

## 2017-01-31 DIAGNOSIS — T18198A Other foreign object in esophagus causing other injury, initial encounter: Secondary | ICD-10-CM | POA: Diagnosis not present

## 2017-01-31 DIAGNOSIS — K222 Esophageal obstruction: Secondary | ICD-10-CM | POA: Diagnosis not present

## 2017-01-31 DIAGNOSIS — N179 Acute kidney failure, unspecified: Secondary | ICD-10-CM | POA: Diagnosis not present

## 2017-01-31 DIAGNOSIS — Z6822 Body mass index (BMI) 22.0-22.9, adult: Secondary | ICD-10-CM | POA: Diagnosis not present

## 2017-01-31 DIAGNOSIS — F329 Major depressive disorder, single episode, unspecified: Secondary | ICD-10-CM | POA: Diagnosis present

## 2017-01-31 DIAGNOSIS — M940 Chondrocostal junction syndrome [Tietze]: Secondary | ICD-10-CM | POA: Diagnosis present

## 2017-01-31 DIAGNOSIS — G35 Multiple sclerosis: Secondary | ICD-10-CM | POA: Diagnosis present

## 2017-01-31 DIAGNOSIS — G43A1 Cyclical vomiting, intractable: Secondary | ICD-10-CM | POA: Diagnosis not present

## 2017-01-31 DIAGNOSIS — G8929 Other chronic pain: Secondary | ICD-10-CM | POA: Diagnosis present

## 2017-01-31 DIAGNOSIS — Z79899 Other long term (current) drug therapy: Secondary | ICD-10-CM | POA: Diagnosis not present

## 2017-01-31 DIAGNOSIS — E43 Unspecified severe protein-calorie malnutrition: Secondary | ICD-10-CM | POA: Diagnosis not present

## 2017-01-31 DIAGNOSIS — N3090 Cystitis, unspecified without hematuria: Secondary | ICD-10-CM | POA: Diagnosis not present

## 2017-01-31 DIAGNOSIS — R1310 Dysphagia, unspecified: Secondary | ICD-10-CM | POA: Diagnosis not present

## 2017-01-31 DIAGNOSIS — Z9884 Bariatric surgery status: Secondary | ICD-10-CM | POA: Diagnosis not present

## 2017-01-31 DIAGNOSIS — K2210 Ulcer of esophagus without bleeding: Secondary | ICD-10-CM | POA: Diagnosis present

## 2017-01-31 DIAGNOSIS — F419 Anxiety disorder, unspecified: Secondary | ICD-10-CM | POA: Diagnosis present

## 2017-01-31 DIAGNOSIS — I10 Essential (primary) hypertension: Secondary | ICD-10-CM | POA: Diagnosis not present

## 2017-01-31 DIAGNOSIS — K210 Gastro-esophageal reflux disease with esophagitis: Secondary | ICD-10-CM | POA: Diagnosis present

## 2017-01-31 DIAGNOSIS — H9190 Unspecified hearing loss, unspecified ear: Secondary | ICD-10-CM | POA: Diagnosis present

## 2017-01-31 DIAGNOSIS — X58XXXA Exposure to other specified factors, initial encounter: Secondary | ICD-10-CM | POA: Diagnosis not present

## 2017-01-31 DIAGNOSIS — Y92239 Unspecified place in hospital as the place of occurrence of the external cause: Secondary | ICD-10-CM | POA: Diagnosis not present

## 2017-01-31 LAB — BASIC METABOLIC PANEL
ANION GAP: 8 (ref 5–15)
BUN: 15 mg/dL (ref 6–20)
CALCIUM: 8.7 mg/dL — AB (ref 8.9–10.3)
CO2: 27 mmol/L (ref 22–32)
Chloride: 104 mmol/L (ref 101–111)
Creatinine, Ser: 0.85 mg/dL (ref 0.44–1.00)
Glucose, Bld: 75 mg/dL (ref 65–99)
Potassium: 3.5 mmol/L (ref 3.5–5.1)
SODIUM: 139 mmol/L (ref 135–145)

## 2017-01-31 MED ORDER — PROMETHAZINE HCL 25 MG/ML IJ SOLN
12.5000 mg | Freq: Four times a day (QID) | INTRAMUSCULAR | Status: DC
  Administered 2017-01-31 – 2017-02-02 (×7): 12.5 mg via INTRAVENOUS
  Filled 2017-01-31 (×9): qty 1

## 2017-01-31 MED ORDER — SODIUM CHLORIDE 0.9 % IV SOLN
INTRAVENOUS | Status: DC
  Administered 2017-01-31 – 2017-02-02 (×3): via INTRAVENOUS

## 2017-01-31 NOTE — Plan of Care (Signed)
Problem: Education: Goal: Knowledge of [facility] Education information/materials will improve Outcome: Progressing POC reviewed with pt.   

## 2017-01-31 NOTE — Consult Note (Addendum)
[facility] Consult: 5:13 PM 01/31/2017  LOS: 0 days    Referring Provider: Dr Caleb Popp  Primary Care Physician:  Brandon Melnick at [facility].  Primary Gastroenterologist:   [facility].  Dr Elveria Rising.      Reason for Consultation:  Nausea and vomiting   IMPRESSION:   *  Nausea and vomiting, chronic Ongoing for 5 months. Severe esophagitis on EGD. Status post laparoscopic cholecystectomy. Symptoms have not improved since the surgery nor with the addition of twice daily PPI. Previous sleeve gastrectomy bypass procedure in 2011 but had no problems until this year. Question gastroparesis, question esophageal dysmotility in a patient with well controlled multiple sclerosis..    *  Urinary tract infection, on Rocephin.  PLAN:     *  Dr. Leone Payor will see the patient. At this point question should we repeat the EGD versus pursue a gastric emptying study versus pursue barium esophagram/upper GI series.  *  Zofran hasn't been helping  We'll add scheduled so we will trial scheduled Phenergan.   Jennye Moccasin  01/31/2017, 2:21 PM Pager: [phone]     [facility] Attending   I have taken an interval history, reviewed the chart and examined the patient. I agree with the Advanced Practitioner's note, impression and recommendations.   Its a bit difficult to sort this out I.e. When what sxs started. Sounds like dysphagia preceded things and she still has that but also vomits. I am going to repeat an EGD and possibly dilate the esophagus. The risks and benefits as well as alternatives of endoscopic procedure(s) have been discussed and reviewed. All questions answered. The patient agrees to proceed.  Don't think this is related to MS  She alsop has a severe protein calorie malnutrition  - will have dietitian see her  DC Lovenox for tomorrowand will restart after  Iva Boop, MD, [facility] [phone] (pager) [phone] after 5 PM, weekends and holidays  01/31/2017 5:13 PM    HPI: Catherine Hubbard is a 56 y.o. female.  PMH multiple sclerosis, on beta interferon.  Hearing loss.  Anxiety/depression.  Costochondritis with chronic chest pain.  Dysphagia and esophageal stricture.  s/p sleeve gastrectomy. S/p hernia repair.  s/p Lap chole 01/08/17 for symptomatic cholelithiasis.  S/p multiple orthopedic surgeries.  Colon polyps ~ 2011 (in [place]) 12/05/16 HIDA scan:   1. Initial lack of filling of the gallbladder which resolved with morphine administration. The cystic duct and common bile duct are patent. 2. Normal hepatocellular uptake of radiopharmaceutical from the blood pool. 12/14/16 Colonoscopy: reported as normal, actual op note not found.    12/14/16 EGD: grade D esophagitis for which she was placed on Protonix twice a day.  Bxs neg fo CMV  Since 10/07/16, patient has had chronic nausea and vomiting along with dysphagia. It's gotten to the point where she can't keep any by mouth down, including her medications. If she is not eating or drinking she does not have symptoms. She was referred to GI and ultimately to surgery and underwent laparoscopic cholecystectomy but the symptoms  persist. Since the endoscopy, when she started on omeprazole twice a day, she has been compliant but it, along with Zofran (po as well as oral) and scopolamine patches have been of no benefit. Prior to February she never had symptoms like this although she does have a history of dysphagia and apparently of esophageal stricture but is not aware that she's never had esophageal dilatation.  She denies new medicines or changes in doses of medicine near the time that her symptoms came on. Her gastric bypass was performed in [place] in 06/2010. Her weight prior to surgery had  reached into the 2 fifties. She lost weight down to the 1 fifties/1 sixties but in recent months has dropped 25-35 pounds due to inability to PO.  List of recent GI studies below. She has not undergone gastric emptying study or barium esophagram. She lives in [place] and she came to [facility] ED for unrelenting, nonbloody, nausea and vomiting. What she describes is a sense that she has burning in the lower esophagus and it makes it difficult for PO to pass beyond this area. No bloody stools, no diarrhea.  Does not use NSAIDs as she was warned not to use these following her gastric bypass.  Laboratories initially showed a low potassium of 3.2, it has now corrected. AK AI present which has resolved with hydration.  No elevated white blood cell count. No anemia, no microcytosis. Urine confirms a UTI and she has been started on Rocephin. Patient describes her MS as well controlled. When it is symptomatic it manifests as slight confusion and word finding difficulty as well as weakness in the hands. She is not in a wheelchair or dependent on cane or walker in order to ambulate. Family hx negative for colorectal cancer or polyps, no IBD, no liver dz.  her grandmother did have swallowing issues and required dilation of the esophagus many many years ago 18 cans beer per week but in recent months she hasn't been drinking any beer or other alcohol.    Past Medical History:  Diagnosis Date  . Asthma   . Esophagitis   . Headache   . Hearing loss   . Hypertension   . Multiple sclerosis (HCC)   . Vision abnormalities     Past Surgical History:  Procedure Laterality Date  . ANKLE FRACTURE SURGERY Bilateral   . CARPAL TUNNEL RELEASE Bilateral   . COLONOSCOPY  11/2016   multiple  . ESOPHAGOGASTRODUODENOSCOPY  11/2016  . LAPAROSCOPIC GASTRIC SLEEVE RESECTION  2011  . ULNAR NERVE REPAIR Bilateral     Prior to Admission medications   Medication Sig Start Date End Date Taking? Authorizing Provider    albuterol (PROVENTIL HFA;VENTOLIN HFA) 108 (90 Base) MCG/ACT inhaler Inhale 2 puffs into the lungs every 6 (six) hours as needed for wheezing or shortness of breath.   Yes [provider]  buPROPion (WELLBUTRIN XL) 300 MG 24 hr tablet TAKE 1 TABLET BY MOUTH DAILY 11/05/16  Yes Sater, Pearletha Furl, MD  diazepam (VALIUM) 5 MG tablet TAKE 1 TABLET BY MOUTH EVERY 12 HOURS AS NEEDED MUSCLE SPASMS 10/22/16  Yes Sater, Pearletha Furl, MD  DULoxetine (CYMBALTA) 60 MG capsule Take 1 capsule (60 mg total) by mouth daily. 09/10/16  Yes Sater, Pearletha Furl, MD  HYDROcodone-acetaminophen (NORCO) 5-325 MG tablet Take 1 tablet by mouth daily as needed for moderate pain. 11/29/16  Yes Sater, Pearletha Furl, MD  Interferon Beta-1a (REBIF REBIDOSE) 44 MCG/0.5ML SOAJ Inject 44 mcg into the skin 3 (three) times  a week. 09/10/16  Yes Sater, Pearletha Furl, MD  omeprazole (PRILOSEC) 20 MG capsule Take 40 mg by mouth 2 (two) times daily.   Yes [provider]  scopolamine (TRANSDERM-SCOP) 1 MG/3DAYS Place 1 patch onto the skin every 3 (three) days.   Yes [provider]  valsartan (DIOVAN) 80 MG tablet Take 80 mg by mouth daily.   Yes [provider]  baclofen (LIORESAL) 10 MG tablet Take 1 tablet (10 mg total) by mouth 3 (three) times daily. Patient not taking: Reported on 01/29/2017 03/28/16   Sater, Pearletha Furl, MD  cyclobenzaprine (FLEXERIL) 5 MG tablet TAKE 1 TABLET BY MOUTH EVERY 8 HOURS AS NEEDED FOR MUSCLE SPASMS Patient not taking: Reported on 01/29/2017 09/24/16   Sater, Pearletha Furl, MD  etodolac (LODINE) 400 MG tablet Take 1 tablet (400 mg total) by mouth 2 (two) times daily. Patient not taking: Reported on 01/29/2017 09/10/16   Sater, Pearletha Furl, MD  modafinil (PROVIGIL) 200 MG tablet TAKE 1 TABLET BY MOUTH IN THE MORNING, AND TAKE 1 TABLET AT NOON Patient not taking: Reported on 01/29/2017 01/07/17   Sater, Pearletha Furl, MD  nabumetone (RELAFEN) 500 MG tablet Take 1 tablet (500 mg total) by mouth 2 (two) times  daily. 05/11/16   Sater, Pearletha Furl, MD  ondansetron (ZOFRAN) 4 MG tablet Take 1 tablet (4 mg total) by mouth every 8 (eight) hours as needed for nausea or vomiting. Patient not taking: Reported on 01/29/2017 10/22/16   Arthor Captain, PA-C  oxyCODONE-acetaminophen (PERCOCET) 5-325 MG tablet Take 1-2 tablets by mouth every 6 (six) hours as needed. Patient not taking: Reported on 01/29/2017 10/22/16   Arthor Captain, PA-C  pantoprazole (PROTONIX) 40 MG tablet Take 1 tablet (40 mg total) by mouth daily. Take this medication 20-30 minutes before the first meal of the day. Patient not taking: Reported on 01/29/2017 10/22/16   Anselm Pancoast, PA-C    Scheduled Meds: . buPROPion  300 mg Oral Daily  . DULoxetine  60 mg Oral Daily  . enoxaparin (LOVENOX) injection  40 mg Subcutaneous Daily  . pantoprazole  80 mg Oral BID  . promethazine  12.5 mg Intravenous Q6H  . scopolamine  1 patch Transdermal Q72H   Infusions: . sodium chloride 100 mL/hr at 01/31/17 1604  . cefTRIAXone (ROCEPHIN)  IV Stopped (01/31/17 0617)   PRN Meds: albuterol, diazepam, hydrALAZINE, ondansetron **OR** ondansetron (ZOFRAN) IV   Allergies as of 01/29/2017 - Review Complete 01/29/2017  Allergen Reaction Noted  . Ibuprofen Other (See Comments) 06/18/2016  . Percocet [oxycodone-acetaminophen] Nausea And Vomiting 10/06/2014    Family History  Problem Relation Age of Onset  . Cancer Mother   . Stroke Mother   . Cancer Father     Social History   Social History  . Marital status: Married    Spouse name: N/A  . Number of children: N/A  . Years of education: N/A   Occupational History  . Not on file.   Social History Main Topics  . Smoking status: Former Games developer  . Smokeless tobacco: Never Used  . Alcohol use 0.0 oz/week     Comment: Sts. she drinks beer, liquor daily, varying amts/fim  . Drug use: No  . Sexual activity: No   Other Topics Concern  . Not on file   Social History Narrative  . No narrative on file     REVIEW OF SYSTEMS: Constitutional:  Generally feels tired and weak due to lack of oral nutrition and hydration. ENT:  No nose bleeds Pulm:  No cough or dyspnea. CV:  No palpitations, no LE edema.  No chest pain although the GI symptoms are in the epigastrium and sometimes she'll feel the same burning pain in her lower sternum  GU:  Burning on urination. Some frequency. GI:  Per HPI Heme:  She bruises fairly easily but she has no instances of excessive bleeding.   No previous diagnosis of anemia. Transfusions:  No previous transfusions. Neuro:  No headaches, no peripheral tingling or numbness Derm:  No itching, no rash or sores.  Endocrine:  No sweats or chills.   Immunization:  Did not inquire as to recent immunizations. Travel:  None beyond local counties in last few months.    PHYSICAL EXAM: Vital signs in last 24 hours: Vitals:   01/31/17 0427 01/31/17 0855  BP: (!) 143/72 (!) 131/94  Pulse: (!) 58 67  Resp: 18 19  Temp: 97.9 F (36.6 C) 97.9 F (36.6 C)   Wt Readings from Last 3 Encounters:  01/30/17 134 lb 4.8 oz (60.9 kg)  10/22/16 155 lb (70.3 kg)  09/10/16 160 lb (72.6 kg)    General: Pleasant, a bit pale looking and mildly chronically ill appearing white female who is fully alert and comfortable. Head:  No facial asymmetry or swelling. No signs of head trauma.  Eyes:  No scleral icterus, no conjunctival pallor. EOMI. Ears:  Not hard of hearing.  Nose:  No congestion, no discharge. Mouth:  His membranes are moist and clear. Tongue is midline. Neck:  No JVD, no masses, no thyromegaly. Lungs:  Clear bilaterally. No cough. No labored breathing. No dyspnea with speech. Heart: RRR. No MRG. S1, S2 present. Abdomen:  Soft. Not tender or distended.   Rectal: Deferred   Musc/Skeltl: No gross joint deformities, redness or swelling. Extremities:  No CCE.  Neurologic:  Alert. Oriented times 3. Good historian. No tremor. Moves all 4 limbs, limb strength was not  tested. Skin:  No telangiectasia or rash. No sores or suspicious lesions.   Psych:  Pleasant, calm. Does not appear depressed.   LAB RESULTS:  Recent Labs  01/29/17 1932 01/30/17 0423  WBC 7.4 5.7  HGB 15.5* 13.3  HCT 45.2 38.8  PLT 392 290   BMET Lab Results  Component Value Date   NA 139 01/31/2017   NA 136 01/30/2017   NA 134 (L) 01/29/2017   K 3.5 01/31/2017   K 3.2 (L) 01/30/2017   K 2.4 (LL) 01/29/2017   CL 104 01/31/2017   CL 100 (L) 01/30/2017   CL 90 (L) 01/29/2017   CO2 27 01/31/2017   CO2 27 01/30/2017   CO2 27 01/29/2017   GLUCOSE 75 01/31/2017   GLUCOSE 63 (L) 01/30/2017   GLUCOSE 122 (H) 01/29/2017   BUN 15 01/31/2017   BUN 20 01/30/2017   BUN 21 (H) 01/29/2017   CREATININE 0.85 01/31/2017   CREATININE 1.11 (H) 01/30/2017   CREATININE 1.31 (H) 01/30/2017   CALCIUM 8.7 (L) 01/31/2017   CALCIUM 8.7 (L) 01/30/2017   CALCIUM 9.7 01/29/2017   LFT  Recent Labs  01/29/17 1932  PROT 7.8  ALBUMIN 3.9  AST 27  ALT 13*  ALKPHOS 62  BILITOT 1.2  BILIDIR <0.1*  IBILI NOT CALCULATED   Lipase     Component Value Date/Time   LIPASE 59 (H) 01/29/2017 1932    Drugs of Abuse     Component Value Date/Time   LABOPIA NONE DETECTED 01/30/2017 0541   COCAINSCRNUR  NONE DETECTED 01/30/2017 0541   LABBENZ POSITIVE (A) 01/30/2017 0541   AMPHETMU NONE DETECTED 01/30/2017 0541   THCU NONE DETECTED 01/30/2017 0541   LABBARB NONE DETECTED 01/30/2017 0541     RADIOLOGY STUDIES: Dg Chest 2 View  Result Date: 01/29/2017 CLINICAL DATA:  Nausea and vomiting over the past 3 days with dizziness. Syncope. EXAM: CHEST  2 VIEW COMPARISON:  Multiple exams, including 10/22/2016 FINDINGS: Numerous old bilateral rib deformities related to healed fractures. Cardiac and mediastinal margins appear normal. The lungs appear clear. IMPRESSION: 1.  No active cardiopulmonary disease is radiographically apparent. 2. Multiple bilateral rib fractures. Electronically Signed   By:  Gaylyn Rong M.D.   On: 01/29/2017 21:16   Ct Abdomen Pelvis W Contrast  Result Date: 01/30/2017 CLINICAL DATA:  Abdominal pain. History of gastric bypass and multiple sclerosis. EXAM: CT ABDOMEN AND PELVIS WITH CONTRAST TECHNIQUE: Multidetector CT imaging of the abdomen and pelvis was performed using the standard protocol following bolus administration of intravenous contrast. CONTRAST:  80mL ISOVUE-300 IOPAMIDOL (ISOVUE-300) INJECTION 61% COMPARISON:  CT abdomen and pelvis October 22, 2016 FINDINGS: LOWER CHEST: Lingular atelectasis/scarring. Heart size is normal. No pericardial effusions HEPATOBILIARY: Interval cholecystectomy. Similar focal fatty infiltration about the falciform ligament, liver is otherwise unremarkable. PANCREAS: Normal. SPLEEN: Normal. ADRENALS/URINARY TRACT: Kidneys are orthotopic, demonstrating symmetric enhancement. No nephrolithiasis, hydronephrosis or solid renal masses. The unopacified ureters are normal in course and caliber. Delayed imaging through the kidneys demonstrates symmetric prompt contrast excretion within the proximal urinary collecting system. Urinary bladder is decompressed with greater than expected wall thickening and intraluminal gas. Normal adrenal glands. STOMACH/BOWEL: Similar distal esophageal wall thickening, cyst status post gastric sleeve with small hiatal hernia. Moderate sigmoid diverticulosis. VASCULAR/LYMPHATIC: Aortoiliac vessels are normal in course and caliber. No lymphadenopathy by CT size criteria. REPRODUCTIVE: 2.8 cm homogeneously hypodense benign appearing RIGHT ovarian cyst, no indicated follow-up. OTHER: No intraperitoneal free fluid or free air. MUSCULOSKELETAL: Nonacute. Old Rib fracture deformities in the included chest. Old LEFT superior and inferior pubic rami fractures. Surgical clips LEFT inguinal soft tissues most compatible with prior vascular access. Small fat containing paraumbilical hernia. Mild scoliosis. Severe L5-S1 degenerative  disc. IMPRESSION: Bladder wall thickening concerning for cystitis with intraluminal gas, recommend correlation with instrumentation. No CT findings of pyelonephritis. Chronic esophageal wall thickening, status post gastric sleeve. Endoscopy again recommended on a nonemergent basis. Electronically Signed   By: Awilda Metro M.D.   On: 01/30/2017 01:44

## 2017-01-31 NOTE — Progress Notes (Signed)
PROGRESS NOTE    Catherine Hubbard  UEA:[id number] DOB: 1960/09/20 DOA: 01/29/2017 PCP: Darrick Grinder, [facility]   Brief Narrative: Catherine Hubbard is a 56 y.o. female with a history of hypertension, asthma, MS. She presented with worsening nausea and vomiting. She is being treated for intractable nausea and vomiting. She has been worked up by GI as an outpatient and found to have an esophageal ulcer with negative biopsies. She was also found to have evidence of cystitis with intraluminal air; urology consulted with no recommendations. Vomiting persistent so GI was consulted.   Assessment & Plan:   Active Problems:   Essential hypertension   Nausea & vomiting   Hypokalemia   Acute kidney injury (HCC)   Cystitis   Intractable nausea/vomiting Chronic issue with significant exacerbation. Still having significant emesis -GI consultation -hold on gastric emptying study until GI evaluates -continue antiemetics -continue scopolamine -NPO  Essential hypertension Moderately controlled. -continue to hold antihypertensives  Esophageal ulcer Seen on EGD per chart review from [facility]. Patient started on high dose PPI. -continue Protonix  Hypokalemia Resolved with repletion  Cystitis With associated intraluminal air. Urology consulted in the ED and advised no surgical interventions -continue ceftriaxone -urine culture pending  Multiple sclerosis -continue Rebif  Depression -continue Bupropion and Cymbalta   DVT prophylaxis: Lovenox Code Status: Full code Family Communication: None at bedside Disposition Plan: Discharge pending improvement of emesis and ability to tolerate diet   Consultants:   Gastroenterologist  Procedures:   None  Antimicrobials:   Ceftriaxone  (6/13>>   Subjective: Patient reports continued nausea and vomiting. She has an associated feeling of stuck food. She reports some burning pain in her  epigastrium.  Objective: Vitals:   01/30/17 1700 01/30/17 2109 01/31/17 0427 01/31/17 0855  BP: 135/90 126/79 (!) 143/72 (!) 131/94  Pulse: 71 64 (!) 58 67  Resp: 18 18 18 19   Temp: 97.4 F (36.3 C) 98.1 F (36.7 C) 97.9 F (36.6 C) 97.9 F (36.6 C)  TempSrc:  Oral Oral Oral  SpO2: 100% 97% 99% 99%  Weight:      Height:        Intake/Output Summary (Last 24 hours) at 01/31/17 1203 Last data filed at 01/31/17 1143  Gross per 24 hour  Intake             1850 ml  Output              450 ml  Net             1400 ml   Filed Weights   01/29/17 1906 01/30/17 0346  Weight: 61.2 kg (135 lb) 60.9 kg (134 lb 4.8 oz)    Examination:  General exam: Appears calm and comfortable  Respiratory system: Clear to auscultation. Respiratory effort normal. Cardiovascular system: S1 & S2 heard, RRR. No murmurs. Gastrointestinal system: Abdomen is nondistended, soft and nontender. Normal bowel sounds heard. Central nervous system: Alert and oriented. No focal neurological deficits. Extremities: No edema. No calf tenderness Skin: No cyanosis. No rashes Psychiatry: Judgement and insight appear normal. Mood & affect appropriate.     Data Reviewed: I have personally reviewed following labs and imaging studies  CBC:  Recent Labs Lab 01/29/17 1932 01/30/17 0423  WBC 7.4 5.7  HGB 15.5* 13.3  HCT 45.2 38.8  MCV 94.2 94.2  PLT 392 290   Basic Metabolic Panel:  Recent Labs Lab 01/29/17 1932 01/30/17 0423 01/30/17 1123 01/31/17 0243  NA 134*  --  136 139  K 2.4*  --  3.2* 3.5  CL 90*  --  100* 104  CO2 27  --  27 27  GLUCOSE 122*  --  63* 75  BUN 21*  --  20 15  CREATININE 1.46* 1.31* 1.11* 0.85  CALCIUM 9.7  --  8.7* 8.7*  MG 1.7  --   --   --    GFR: Estimated Creatinine Clearance: 66.5 mL/min (by C-G formula based on SCr of 0.85 mg/dL). Liver Function Tests:  Recent Labs Lab 01/29/17 1932  AST 27  ALT 13*  ALKPHOS 62  BILITOT 1.2  PROT 7.8  ALBUMIN 3.9     Recent Labs Lab 01/29/17 1932  LIPASE 59*   No results for input(s): AMMONIA in the last 168 hours. Coagulation Profile: No results for input(s): INR, PROTIME in the last 168 hours. Cardiac Enzymes: No results for input(s): CKTOTAL, CKMB, CKMBINDEX, TROPONINI in the last 168 hours. BNP (last 3 results) No results for input(s): PROBNP in the last 8760 hours. HbA1C: No results for input(s): HGBA1C in the last 72 hours. CBG:  Recent Labs Lab 01/29/17 2002  GLUCAP 106*   Lipid Profile: No results for input(s): CHOL, HDL, LDLCALC, TRIG, CHOLHDL, LDLDIRECT in the last 72 hours. Thyroid Function Tests: No results for input(s): TSH, T4TOTAL, FREET4, T3FREE, THYROIDAB in the last 72 hours. Anemia Panel: No results for input(s): VITAMINB12, FOLATE, FERRITIN, TIBC, IRON, RETICCTPCT in the last 72 hours. Sepsis Labs: No results for input(s): PROCALCITON, LATICACIDVEN in the last 168 hours.  Recent Results (from the past 240 hour(s))  Urine culture     Status: Abnormal (Preliminary result)   Collection Time: 01/30/17  5:41 AM  Result Value Ref Range Status   Specimen Description URINE, RANDOM  Final   Special Requests NONE  Final   Culture 80,000 COLONIES/mL GRAM NEGATIVE RODS (A)  Final   Report Status PENDING  Incomplete         Radiology Studies: Dg Chest 2 View  Result Date: 01/29/2017 CLINICAL DATA:  Nausea and vomiting over the past 3 days with dizziness. Syncope. EXAM: CHEST  2 VIEW COMPARISON:  Multiple exams, including 10/22/2016 FINDINGS: Numerous old bilateral rib deformities related to healed fractures. Cardiac and mediastinal margins appear normal. The lungs appear clear. IMPRESSION: 1.  No active cardiopulmonary disease is radiographically apparent. 2. Multiple bilateral rib fractures. Electronically Signed   By: Gaylyn Rong M.D.   On: 01/29/2017 21:16   Ct Abdomen Pelvis W Contrast  Result Date: 01/30/2017 CLINICAL DATA:  Abdominal pain. History of  gastric bypass and multiple sclerosis. EXAM: CT ABDOMEN AND PELVIS WITH CONTRAST TECHNIQUE: Multidetector CT imaging of the abdomen and pelvis was performed using the standard protocol following bolus administration of intravenous contrast. CONTRAST:  80mL ISOVUE-300 IOPAMIDOL (ISOVUE-300) INJECTION 61% COMPARISON:  CT abdomen and pelvis October 22, 2016 FINDINGS: LOWER CHEST: Lingular atelectasis/scarring. Heart size is normal. No pericardial effusions HEPATOBILIARY: Interval cholecystectomy. Similar focal fatty infiltration about the falciform ligament, liver is otherwise unremarkable. PANCREAS: Normal. SPLEEN: Normal. ADRENALS/URINARY TRACT: Kidneys are orthotopic, demonstrating symmetric enhancement. No nephrolithiasis, hydronephrosis or solid renal masses. The unopacified ureters are normal in course and caliber. Delayed imaging through the kidneys demonstrates symmetric prompt contrast excretion within the proximal urinary collecting system. Urinary bladder is decompressed with greater than expected wall thickening and intraluminal gas. Normal adrenal glands. STOMACH/BOWEL: Similar distal esophageal wall thickening, cyst status post gastric sleeve with small hiatal hernia. Moderate sigmoid diverticulosis. VASCULAR/LYMPHATIC: Aortoiliac vessels  are normal in course and caliber. No lymphadenopathy by CT size criteria. REPRODUCTIVE: 2.8 cm homogeneously hypodense benign appearing RIGHT ovarian cyst, no indicated follow-up. OTHER: No intraperitoneal free fluid or free air. MUSCULOSKELETAL: Nonacute. Old Rib fracture deformities in the included chest. Old LEFT superior and inferior pubic rami fractures. Surgical clips LEFT inguinal soft tissues most compatible with prior vascular access. Small fat containing paraumbilical hernia. Mild scoliosis. Severe L5-S1 degenerative disc. IMPRESSION: Bladder wall thickening concerning for cystitis with intraluminal gas, recommend correlation with instrumentation. No CT findings  of pyelonephritis. Chronic esophageal wall thickening, status post gastric sleeve. Endoscopy again recommended on a nonemergent basis. Electronically Signed   By: Awilda Metro M.D.   On: 01/30/2017 01:44        Scheduled Meds: . buPROPion  300 mg Oral Daily  . DULoxetine  60 mg Oral Daily  . enoxaparin (LOVENOX) injection  40 mg Subcutaneous Daily  . pantoprazole  80 mg Oral BID  . scopolamine  1 patch Transdermal Q72H   Continuous Infusions: . cefTRIAXone (ROCEPHIN)  IV 1 g (01/31/17 0547)     LOS: 0 days     Jacquelin Hawking, MD [facility] 01/31/2017, 12:03 PM Pager: [phone]  If 7PM-7AM, please contact night-coverage [email or web address] Password  [facility] 01/31/2017, 12:03 PM

## 2017-02-01 ENCOUNTER — Encounter (HOSPITAL_COMMUNITY)
Admission: EM | Disposition: A | Discharge status: Home / Self Care | Payer: Self-pay | Source: Home / Self Care | Attending: Family Medicine

## 2017-02-01 ENCOUNTER — Encounter (HOSPITAL_COMMUNITY): Payer: Self-pay

## 2017-02-01 DIAGNOSIS — N390 Urinary tract infection, site not specified: Secondary | ICD-10-CM

## 2017-02-01 DIAGNOSIS — K222 Esophageal obstruction: Secondary | ICD-10-CM

## 2017-02-01 HISTORY — PX: ESOPHAGOGASTRODUODENOSCOPY: SHX5428

## 2017-02-01 LAB — URINE CULTURE: Culture: 80000 — AB

## 2017-02-01 LAB — BASIC METABOLIC PANEL
Anion gap: 8 (ref 5–15)
BUN: 6 mg/dL (ref 6–20)
CALCIUM: 8.6 mg/dL — AB (ref 8.9–10.3)
CO2: 28 mmol/L (ref 22–32)
CREATININE: 0.74 mg/dL (ref 0.44–1.00)
Chloride: 104 mmol/L (ref 101–111)
GFR calc non Af Amer: >60 mL/min (ref >60)
Glucose, Bld: 82 mg/dL (ref 65–99)
Potassium: 2.8 mmol/L — ABNORMAL LOW (ref 3.5–5.1)
SODIUM: 140 mmol/L (ref 135–145)

## 2017-02-01 LAB — MAGNESIUM: Magnesium: 1.3 mg/dL — ABNORMAL LOW (ref 1.7–2.4)

## 2017-02-01 SURGERY — EGD (ESOPHAGOGASTRODUODENOSCOPY)
Anesthesia: Moderate Sedation

## 2017-02-01 MED ORDER — PREMIER PROTEIN SHAKE
11.0000 [oz_av] | Freq: Three times a day (TID) | ORAL | Status: DC
  Administered 2017-02-02 – 2017-02-04 (×3): 11 [oz_av] via ORAL
  Filled 2017-02-01 (×4): qty 325.31

## 2017-02-01 MED ORDER — FENTANYL CITRATE (PF) 100 MCG/2ML IJ SOLN
INTRAMUSCULAR | Status: AC
  Filled 2017-02-01: qty 2

## 2017-02-01 MED ORDER — BUTAMBEN-TETRACAINE-BENZOCAINE 2-2-14 % EX AERO
INHALATION_SPRAY | CUTANEOUS | Status: DC | PRN
  Administered 2017-02-01: 2 via TOPICAL

## 2017-02-01 MED ORDER — HYDROMORPHONE HCL 1 MG/ML IJ SOLN
0.5000 mg | Freq: Once | INTRAMUSCULAR | Status: AC | PRN
  Administered 2017-02-01: 1 mg via INTRAVENOUS
  Filled 2017-02-01: qty 1

## 2017-02-01 MED ORDER — PANTOPRAZOLE SODIUM 40 MG IV SOLR
40.0000 mg | Freq: Two times a day (BID) | INTRAVENOUS | Status: DC
  Administered 2017-02-01 – 2017-02-04 (×7): 40 mg via INTRAVENOUS
  Filled 2017-02-01 (×7): qty 40

## 2017-02-01 MED ORDER — MIDAZOLAM HCL 10 MG/2ML IJ SOLN
INTRAMUSCULAR | Status: DC | PRN
  Administered 2017-02-01: 2 mg via INTRAVENOUS
  Administered 2017-02-01: 1 mg via INTRAVENOUS

## 2017-02-01 MED ORDER — FENTANYL CITRATE (PF) 100 MCG/2ML IJ SOLN
INTRAMUSCULAR | Status: DC | PRN
  Administered 2017-02-01: 25 ug via INTRAVENOUS
  Administered 2017-02-01: 12.5 ug via INTRAVENOUS

## 2017-02-01 MED ORDER — LACTATED RINGERS IV SOLN: INTRAVENOUS | Status: DC

## 2017-02-01 MED ORDER — POTASSIUM CHLORIDE 10 MEQ/100ML IV SOLN
10.0000 meq | INTRAVENOUS | Status: AC
  Administered 2017-02-01 (×6): 10 meq via INTRAVENOUS
  Filled 2017-02-01 (×6): qty 100

## 2017-02-01 MED ORDER — SUCRALFATE 1 GM/10ML PO SUSP
1.0000 g | Freq: Three times a day (TID) | ORAL | Status: DC
  Administered 2017-02-01 – 2017-02-04 (×12): 1 g via ORAL
  Filled 2017-02-01 (×13): qty 10

## 2017-02-01 MED ORDER — ENOXAPARIN SODIUM 40 MG/0.4ML ~~LOC~~ SOLN
40.0000 mg | SUBCUTANEOUS | Status: AC
  Administered 2017-02-01 – 2017-02-03 (×3): 40 mg via SUBCUTANEOUS
  Filled 2017-02-01 (×3): qty 0.4

## 2017-02-01 MED ORDER — MIDAZOLAM HCL 5 MG/ML IJ SOLN
INTRAMUSCULAR | Status: AC
  Filled 2017-02-01: qty 2

## 2017-02-01 MED ORDER — BOOST / RESOURCE BREEZE PO LIQD
1.0000 | Freq: Three times a day (TID) | ORAL | Status: AC
  Administered 2017-02-01: 1 via ORAL

## 2017-02-01 MED ORDER — SODIUM CHLORIDE 0.9 % IV SOLN
INTRAVENOUS | Status: DC
  Administered 2017-02-01: 04:00:00 via INTRAVENOUS

## 2017-02-01 NOTE — H&P (View-Only) (Signed)
[facility] Consult: 5:13 PM 01/31/2017  LOS: 0 days    Referring Provider: Dr Caleb Popp  Primary Care Physician:  Brandon Melnick at [facility].  Primary Gastroenterologist:   [facility].  Dr Elveria Rising.      Reason for Consultation:  Nausea and vomiting   IMPRESSION:   *  Nausea and vomiting, chronic Ongoing for 5 months. Severe esophagitis on EGD. Status post laparoscopic cholecystectomy. Symptoms have not improved since the surgery nor with the addition of twice daily PPI. Previous sleeve gastrectomy bypass procedure in 2011 but had no problems until this year. Question gastroparesis, question esophageal dysmotility in a patient with well controlled multiple sclerosis..    *  Urinary tract infection, on Rocephin.  PLAN:     *  Dr. Leone Payor will see the patient. At this point question should we repeat the EGD versus pursue a gastric emptying study versus pursue barium esophagram/upper GI series.  *  Zofran hasn't been helping  We'll add scheduled so we will trial scheduled Phenergan.   Jennye Moccasin  01/31/2017, 2:21 PM Pager: [phone]     [facility] Attending   I have taken an interval history, reviewed the chart and examined the patient. I agree with the Advanced Practitioner's note, impression and recommendations.   Its a bit difficult to sort this out I.e. When what sxs started. Sounds like dysphagia preceded things and she still has that but also vomits. I am going to repeat an EGD and possibly dilate the esophagus. The risks and benefits as well as alternatives of endoscopic procedure(s) have been discussed and reviewed. All questions answered. The patient agrees to proceed.  Don't think this is related to MS  She alsop has a severe protein calorie malnutrition  - will have dietitian see her  DC Lovenox for tomorrowand will restart after  Iva Boop, MD, [facility] [phone] (pager) [phone] after 5 PM, weekends and holidays  01/31/2017 5:13 PM    HPI: Catherine Hubbard is a 56 y.o. female.  PMH multiple sclerosis, on beta interferon.  Hearing loss.  Anxiety/depression.  Costochondritis with chronic chest pain.  Dysphagia and esophageal stricture.  s/p sleeve gastrectomy. S/p hernia repair.  s/p Lap chole 01/08/17 for symptomatic cholelithiasis.  S/p multiple orthopedic surgeries.  Colon polyps ~ 2011 (in [place]) 12/05/16 HIDA scan:   1. Initial lack of filling of the gallbladder which resolved with morphine administration. The cystic duct and common bile duct are patent. 2. Normal hepatocellular uptake of radiopharmaceutical from the blood pool. 12/14/16 Colonoscopy: reported as normal, actual op note not found.    12/14/16 EGD: grade D esophagitis for which she was placed on Protonix twice a day.  Bxs neg fo CMV  Since 10/07/16, patient has had chronic nausea and vomiting along with dysphagia. It's gotten to the point where she can't keep any by mouth down, including her medications. If she is not eating or drinking she does not have symptoms. She was referred to GI and ultimately to surgery and underwent laparoscopic cholecystectomy but the symptoms  persist. Since the endoscopy, when she started on omeprazole twice a day, she has been compliant but it, along with Zofran (po as well as oral) and scopolamine patches have been of no benefit. Prior to February she never had symptoms like this although she does have a history of dysphagia and apparently of esophageal stricture but is not aware that she's never had esophageal dilatation.  She denies new medicines or changes in doses of medicine near the time that her symptoms came on. Her gastric bypass was performed in [place] in 06/2010. Her weight prior to surgery had  reached into the 2 fifties. She lost weight down to the 1 fifties/1 sixties but in recent months has dropped 25-35 pounds due to inability to PO.  List of recent GI studies below. She has not undergone gastric emptying study or barium esophagram. She lives in [place] and she came to [facility] ED for unrelenting, nonbloody, nausea and vomiting. What she describes is a sense that she has burning in the lower esophagus and it makes it difficult for PO to pass beyond this area. No bloody stools, no diarrhea.  Does not use NSAIDs as she was warned not to use these following her gastric bypass.  Laboratories initially showed a low potassium of 3.2, it has now corrected. AK AI present which has resolved with hydration.  No elevated white blood cell count. No anemia, no microcytosis. Urine confirms a UTI and she has been started on Rocephin. Patient describes her MS as well controlled. When it is symptomatic it manifests as slight confusion and word finding difficulty as well as weakness in the hands. She is not in a wheelchair or dependent on cane or walker in order to ambulate. Family hx negative for colorectal cancer or polyps, no IBD, no liver dz.  her grandmother did have swallowing issues and required dilation of the esophagus many many years ago 18 cans beer per week but in recent months she hasn't been drinking any beer or other alcohol.    Past Medical History:  Diagnosis Date  . Asthma   . Esophagitis   . Headache   . Hearing loss   . Hypertension   . Multiple sclerosis (HCC)   . Vision abnormalities     Past Surgical History:  Procedure Laterality Date  . ANKLE FRACTURE SURGERY Bilateral   . CARPAL TUNNEL RELEASE Bilateral   . COLONOSCOPY  11/2016   multiple  . ESOPHAGOGASTRODUODENOSCOPY  11/2016  . LAPAROSCOPIC GASTRIC SLEEVE RESECTION  2011  . ULNAR NERVE REPAIR Bilateral     Prior to Admission medications   Medication Sig Start Date End Date Taking? Authorizing Provider    albuterol (PROVENTIL HFA;VENTOLIN HFA) 108 (90 Base) MCG/ACT inhaler Inhale 2 puffs into the lungs every 6 (six) hours as needed for wheezing or shortness of breath.   Yes [provider]  buPROPion (WELLBUTRIN XL) 300 MG 24 hr tablet TAKE 1 TABLET BY MOUTH DAILY 11/05/16  Yes Sater, Pearletha Furl, MD  diazepam (VALIUM) 5 MG tablet TAKE 1 TABLET BY MOUTH EVERY 12 HOURS AS NEEDED MUSCLE SPASMS 10/22/16  Yes Sater, Pearletha Furl, MD  DULoxetine (CYMBALTA) 60 MG capsule Take 1 capsule (60 mg total) by mouth daily. 09/10/16  Yes Sater, Pearletha Furl, MD  HYDROcodone-acetaminophen (NORCO) 5-325 MG tablet Take 1 tablet by mouth daily as needed for moderate pain. 11/29/16  Yes Sater, Pearletha Furl, MD  Interferon Beta-1a (REBIF REBIDOSE) 44 MCG/0.5ML SOAJ Inject 44 mcg into the skin 3 (three) times  a week. 09/10/16  Yes Sater, Pearletha Furl, MD  omeprazole (PRILOSEC) 20 MG capsule Take 40 mg by mouth 2 (two) times daily.   Yes [provider]  scopolamine (TRANSDERM-SCOP) 1 MG/3DAYS Place 1 patch onto the skin every 3 (three) days.   Yes [provider]  valsartan (DIOVAN) 80 MG tablet Take 80 mg by mouth daily.   Yes [provider]  baclofen (LIORESAL) 10 MG tablet Take 1 tablet (10 mg total) by mouth 3 (three) times daily. Patient not taking: Reported on 01/29/2017 03/28/16   Sater, Pearletha Furl, MD  cyclobenzaprine (FLEXERIL) 5 MG tablet TAKE 1 TABLET BY MOUTH EVERY 8 HOURS AS NEEDED FOR MUSCLE SPASMS Patient not taking: Reported on 01/29/2017 09/24/16   Sater, Pearletha Furl, MD  etodolac (LODINE) 400 MG tablet Take 1 tablet (400 mg total) by mouth 2 (two) times daily. Patient not taking: Reported on 01/29/2017 09/10/16   Sater, Pearletha Furl, MD  modafinil (PROVIGIL) 200 MG tablet TAKE 1 TABLET BY MOUTH IN THE MORNING, AND TAKE 1 TABLET AT NOON Patient not taking: Reported on 01/29/2017 01/07/17   Sater, Pearletha Furl, MD  nabumetone (RELAFEN) 500 MG tablet Take 1 tablet (500 mg total) by mouth 2 (two) times  daily. 05/11/16   Sater, Pearletha Furl, MD  ondansetron (ZOFRAN) 4 MG tablet Take 1 tablet (4 mg total) by mouth every 8 (eight) hours as needed for nausea or vomiting. Patient not taking: Reported on 01/29/2017 10/22/16   Arthor Captain, PA-C  oxyCODONE-acetaminophen (PERCOCET) 5-325 MG tablet Take 1-2 tablets by mouth every 6 (six) hours as needed. Patient not taking: Reported on 01/29/2017 10/22/16   Arthor Captain, PA-C  pantoprazole (PROTONIX) 40 MG tablet Take 1 tablet (40 mg total) by mouth daily. Take this medication 20-30 minutes before the first meal of the day. Patient not taking: Reported on 01/29/2017 10/22/16   Anselm Pancoast, PA-C    Scheduled Meds: . buPROPion  300 mg Oral Daily  . DULoxetine  60 mg Oral Daily  . enoxaparin (LOVENOX) injection  40 mg Subcutaneous Daily  . pantoprazole  80 mg Oral BID  . promethazine  12.5 mg Intravenous Q6H  . scopolamine  1 patch Transdermal Q72H   Infusions: . sodium chloride 100 mL/hr at 01/31/17 1604  . cefTRIAXone (ROCEPHIN)  IV Stopped (01/31/17 0617)   PRN Meds: albuterol, diazepam, hydrALAZINE, ondansetron **OR** ondansetron (ZOFRAN) IV   Allergies as of 01/29/2017 - Review Complete 01/29/2017  Allergen Reaction Noted  . Ibuprofen Other (See Comments) 06/18/2016  . Percocet [oxycodone-acetaminophen] Nausea And Vomiting 10/06/2014    Family History  Problem Relation Age of Onset  . Cancer Mother   . Stroke Mother   . Cancer Father     Social History   Social History  . Marital status: Married    Spouse name: N/A  . Number of children: N/A  . Years of education: N/A   Occupational History  . Not on file.   Social History Main Topics  . Smoking status: Former Games developer  . Smokeless tobacco: Never Used  . Alcohol use 0.0 oz/week     Comment: Sts. she drinks beer, liquor daily, varying amts/fim  . Drug use: No  . Sexual activity: No   Other Topics Concern  . Not on file   Social History Narrative  . No narrative on file     REVIEW OF SYSTEMS: Constitutional:  Generally feels tired and weak due to lack of oral nutrition and hydration. ENT:  No nose bleeds Pulm:  No cough or dyspnea. CV:  No palpitations, no LE edema.  No chest pain although the GI symptoms are in the epigastrium and sometimes she'll feel the same burning pain in her lower sternum  GU:  Burning on urination. Some frequency. GI:  Per HPI Heme:  She bruises fairly easily but she has no instances of excessive bleeding.   No previous diagnosis of anemia. Transfusions:  No previous transfusions. Neuro:  No headaches, no peripheral tingling or numbness Derm:  No itching, no rash or sores.  Endocrine:  No sweats or chills.   Immunization:  Did not inquire as to recent immunizations. Travel:  None beyond local counties in last few months.    PHYSICAL EXAM: Vital signs in last 24 hours: Vitals:   01/31/17 0427 01/31/17 0855  BP: (!) 143/72 (!) 131/94  Pulse: (!) 58 67  Resp: 18 19  Temp: 97.9 F (36.6 C) 97.9 F (36.6 C)   Wt Readings from Last 3 Encounters:  01/30/17 134 lb 4.8 oz (60.9 kg)  10/22/16 155 lb (70.3 kg)  09/10/16 160 lb (72.6 kg)    General: Pleasant, a bit pale looking and mildly chronically ill appearing white female who is fully alert and comfortable. Head:  No facial asymmetry or swelling. No signs of head trauma.  Eyes:  No scleral icterus, no conjunctival pallor. EOMI. Ears:  Not hard of hearing.  Nose:  No congestion, no discharge. Mouth:  His membranes are moist and clear. Tongue is midline. Neck:  No JVD, no masses, no thyromegaly. Lungs:  Clear bilaterally. No cough. No labored breathing. No dyspnea with speech. Heart: RRR. No MRG. S1, S2 present. Abdomen:  Soft. Not tender or distended.   Rectal: Deferred   Musc/Skeltl: No gross joint deformities, redness or swelling. Extremities:  No CCE.  Neurologic:  Alert. Oriented times 3. Good historian. No tremor. Moves all 4 limbs, limb strength was not  tested. Skin:  No telangiectasia or rash. No sores or suspicious lesions.   Psych:  Pleasant, calm. Does not appear depressed.   LAB RESULTS:  Recent Labs  01/29/17 1932 01/30/17 0423  WBC 7.4 5.7  HGB 15.5* 13.3  HCT 45.2 38.8  PLT 392 290   BMET Lab Results  Component Value Date   NA 139 01/31/2017   NA 136 01/30/2017   NA 134 (L) 01/29/2017   K 3.5 01/31/2017   K 3.2 (L) 01/30/2017   K 2.4 (LL) 01/29/2017   CL 104 01/31/2017   CL 100 (L) 01/30/2017   CL 90 (L) 01/29/2017   CO2 27 01/31/2017   CO2 27 01/30/2017   CO2 27 01/29/2017   GLUCOSE 75 01/31/2017   GLUCOSE 63 (L) 01/30/2017   GLUCOSE 122 (H) 01/29/2017   BUN 15 01/31/2017   BUN 20 01/30/2017   BUN 21 (H) 01/29/2017   CREATININE 0.85 01/31/2017   CREATININE 1.11 (H) 01/30/2017   CREATININE 1.31 (H) 01/30/2017   CALCIUM 8.7 (L) 01/31/2017   CALCIUM 8.7 (L) 01/30/2017   CALCIUM 9.7 01/29/2017   LFT  Recent Labs  01/29/17 1932  PROT 7.8  ALBUMIN 3.9  AST 27  ALT 13*  ALKPHOS 62  BILITOT 1.2  BILIDIR <0.1*  IBILI NOT CALCULATED   Lipase     Component Value Date/Time   LIPASE 59 (H) 01/29/2017 1932    Drugs of Abuse     Component Value Date/Time   LABOPIA NONE DETECTED 01/30/2017 0541   COCAINSCRNUR  NONE DETECTED 01/30/2017 0541   LABBENZ POSITIVE (A) 01/30/2017 0541   AMPHETMU NONE DETECTED 01/30/2017 0541   THCU NONE DETECTED 01/30/2017 0541   LABBARB NONE DETECTED 01/30/2017 0541     RADIOLOGY STUDIES: Dg Chest 2 View  Result Date: 01/29/2017 CLINICAL DATA:  Nausea and vomiting over the past 3 days with dizziness. Syncope. EXAM: CHEST  2 VIEW COMPARISON:  Multiple exams, including 10/22/2016 FINDINGS: Numerous old bilateral rib deformities related to healed fractures. Cardiac and mediastinal margins appear normal. The lungs appear clear. IMPRESSION: 1.  No active cardiopulmonary disease is radiographically apparent. 2. Multiple bilateral rib fractures. Electronically Signed   By:  Gaylyn Rong M.D.   On: 01/29/2017 21:16   Ct Abdomen Pelvis W Contrast  Result Date: 01/30/2017 CLINICAL DATA:  Abdominal pain. History of gastric bypass and multiple sclerosis. EXAM: CT ABDOMEN AND PELVIS WITH CONTRAST TECHNIQUE: Multidetector CT imaging of the abdomen and pelvis was performed using the standard protocol following bolus administration of intravenous contrast. CONTRAST:  80mL ISOVUE-300 IOPAMIDOL (ISOVUE-300) INJECTION 61% COMPARISON:  CT abdomen and pelvis October 22, 2016 FINDINGS: LOWER CHEST: Lingular atelectasis/scarring. Heart size is normal. No pericardial effusions HEPATOBILIARY: Interval cholecystectomy. Similar focal fatty infiltration about the falciform ligament, liver is otherwise unremarkable. PANCREAS: Normal. SPLEEN: Normal. ADRENALS/URINARY TRACT: Kidneys are orthotopic, demonstrating symmetric enhancement. No nephrolithiasis, hydronephrosis or solid renal masses. The unopacified ureters are normal in course and caliber. Delayed imaging through the kidneys demonstrates symmetric prompt contrast excretion within the proximal urinary collecting system. Urinary bladder is decompressed with greater than expected wall thickening and intraluminal gas. Normal adrenal glands. STOMACH/BOWEL: Similar distal esophageal wall thickening, cyst status post gastric sleeve with small hiatal hernia. Moderate sigmoid diverticulosis. VASCULAR/LYMPHATIC: Aortoiliac vessels are normal in course and caliber. No lymphadenopathy by CT size criteria. REPRODUCTIVE: 2.8 cm homogeneously hypodense benign appearing RIGHT ovarian cyst, no indicated follow-up. OTHER: No intraperitoneal free fluid or free air. MUSCULOSKELETAL: Nonacute. Old Rib fracture deformities in the included chest. Old LEFT superior and inferior pubic rami fractures. Surgical clips LEFT inguinal soft tissues most compatible with prior vascular access. Small fat containing paraumbilical hernia. Mild scoliosis. Severe L5-S1 degenerative  disc. IMPRESSION: Bladder wall thickening concerning for cystitis with intraluminal gas, recommend correlation with instrumentation. No CT findings of pyelonephritis. Chronic esophageal wall thickening, status post gastric sleeve. Endoscopy again recommended on a nonemergent basis. Electronically Signed   By: Awilda Metro M.D.   On: 01/30/2017 01:44

## 2017-02-01 NOTE — Progress Notes (Signed)
Initial Nutrition Assessment  DOCUMENTATION CODES:   Severe malnutrition in context of acute illness/injury  INTERVENTION:   -Boost Breeze po TID, each supplement provides 250 kcal and 9 grams of protein -Once diet is advanced, add: Premier Protein TID  NUTRITION DIAGNOSIS:   Malnutrition (severe) related to acute illness (esophageal stricture) as evidenced by moderate depletion of body fat, severe depletion of body fat, moderate depletions of muscle mass, severe depletion of muscle mass, percent weight loss, energy intake < 75% for > or equal to 1 month.  GOAL:   Patient will meet greater than or equal to 90% of their needs  MONITOR:   PO intake, Supplement acceptance, Diet advancement, Labs, Weight trends, Skin, I & O's  REASON FOR ASSESSMENT:   Consult Assessment of nutrition requirement/status  ASSESSMENT:   Catherine Hubbard is a 56 y.o. female with history of hypertension, asthma, multiple sclerosis presented to the ER because of worsening nausea vomiting.  Pt admitted with persistent nausea and vomiting.   6/15- s/p EGD, which revealed severe stricture at GD junction, per GI notes, plan repeat EGD on 02/04/17  Spoke with pt at bedside, who reports she has a hx of bariatric surgery (done approximately 7 years ago). She generally has a good appetite and is very compliant with protein and vitamin intake. She shares she consumes 2-3 Premier Protein shakes at home and orders specialty high protein soups and food products online ([email or web address].[email or web address]). She reports UBW is around 175#.   Pt reports a general decline in health over the past 4 months. She has been experiencing nausea and vomiting with intake. She shares that it is has become so severe that she has been unable to take vitamins or medications. She reports improvement since EGD, noted pt consumed water, Boost Breeze, gingerale, and ice pop without difficulty.   Reviewed wt hx. Pt has experienced a 10.3% wt loss  over the past 3 months.   Nutrition-Focused physical exam completed. Findings are moderate to severe fat depletion, moderate to severe muscle depletion, and no edema. Pt describes herself as very active at baseline- she was assisting a friend in [place] recover from surgery prior to acute illness. Now, she has mainly bedbound due to weakness.   Discussed supplement options as well as potential for diet progression. Pt very knowledgeable about protein intake, need to take vitamin and mineral supplements, and importance of nutrition for healing.   Labs reviewed: K: 2.8.   Diet Order:  Diet bariatric full liquid Room service appropriate? Yes; Fluid consistency: Thin Diet NPO time specified Diet bariatric clear liquid Room service appropriate? Yes; Fluid consistency: Thin  Skin:  Reviewed, no issues  Last BM:  01/30/17  Height:   Ht Readings from Last 1 Encounters:  02/01/17 5\' 5"  (1.651 m)    Weight:   Wt Readings from Last 1 Encounters:  02/01/17 139 lb (63 kg)    Ideal Body Weight:  56.8 kg  BMI:  Body mass index is 23.13 kg/m.  Estimated Nutritional Needs:   Kcal:  1700-1900  Protein:  95-110 grams  Fluid:  1.7-1.9 L  EDUCATION NEEDS:   Education needs addressed  Catherine Hubbard A. Mayford Knife, RD, LDN, CDE Pager: [phone] After hours Pager: [phone]

## 2017-02-01 NOTE — Op Note (Signed)
[facility] Patient Name: Catherine Hubbard Procedure Date : 02/01/2017 MRN: [id number] Attending MD: Iva Boop , MD Date of Birth: 12-Apr-1961 CSN: [id number] Age: 56 Admit Type: Outpatient Procedure:                Upper GI endoscopy Indications:              Dysphagia Providers:                Iva Boop, MD, Jacquiline Doe, RN, Jacqulyn Liner, Technician Referring MD:              Medicines:                Midazolam 3 mg IV, Fentanyl 37.5 micrograms IV,                            Cetacaine spray Complications:            No immediate complications. Estimated Blood Loss:     Estimated blood loss was minimal. Procedure:                Pre-Anesthesia Assessment:                           - Prior to the procedure, a History and Physical                            was performed, and patient medications and                            allergies were reviewed. The patient's tolerance of                            previous anesthesia was also reviewed. The risks                            and benefits of the procedure and the sedation                            options and risks were discussed with the patient.                            All questions were answered, and informed consent                            was obtained. Prior Anticoagulants: The patient                            last took Lovenox (enoxaparin) 1 day prior to the                            procedure. ASA Grade Assessment: II - A patient  with mild systemic disease. After reviewing the                            risks and benefits, the patient was deemed in                            satisfactory condition to undergo the procedure.                           After obtaining informed consent, the endoscope was                            passed under direct vision. Throughout the                            procedure, the patient's blood pressure,  pulse, and                            oxygen saturations were monitored continuously. The                            was introduced through the mouth, and advanced to                            the lower third of esophagus. The patient tolerated                            the procedure well. The upper GI endoscopy was                            technically difficult and complex due to stricture. Scope In: Scope Out: Findings:      One severe benign-appearing, intrinsic stenosis was found at the       gastroesophageal junction. This measured 5 mm (inner diameter) and was       not traversed. A TTS dilator was passed through the scope. Dilation with       a 6-7-8 mm balloon and an 03-28-09 mm balloon dilator was performed to 9       mm. The dilation site was examined and showed moderate improvement in       luminal narrowing. Estimated blood loss was minimal. Impression:               - Benign-appearing esophageal stenosis. Dilated.                            IOnfallamtory-appearing severe stricture - dilated                            to 9 mm. Cause not clear presune reflux but why a                            problem after years of doing ok after surgery - Up  to Date says can be early or late complication and                            if refractory to medical Tx conversion to lap                            roux-en-y is answer. I cannot say she has failed                            medical Tx as was on capsules of omeprazole and she                            herself started opening them into applesauce when                            dysphagia worsened.                           - No specimens collected. Moderate Sedation:      Moderate (conscious) sedation was administered by the endoscopy nurse       and supervised by the endoscopist. The following parameters were       monitored: oxygen saturation, heart rate, blood pressure, respiratory       rate, EKG,  adequacy of pulmonary ventilation, and response to care.       Total physician intraservice time was 19 minutes. Recommendation:           - Return patient to hospital ward for ongoing care.                           - Clear liquid diet.                           - Switch to IV PPI                           Try bariatric full liquid tomorrow                           Add som carafate slurry also                           repeat dilation 6./18 and hopefully home after that                            and would anticipate an outpatient dilation again                            1-2 weeks after that                           If we can I would put her on Zegerid poqwder 40 mg                            bid as outpatient or Prevacid solutab 30  mg bid                           - Continue present medications.                           - Resume Eliquis (apixaban) at prior dose today.                            Last dose again Sunday 6/17 in anticipation of                            repeat dilation 6/18 Procedure Code(s):        --- Professional ---                           43220, Esophagoscopy, flexible, transoral; with                            transendoscopic balloon dilation (less than 30 mm                            diameter)                           99 152, Moderate sedation services provided by the                            same physician or other qualified health care                            professional performing the diagnostic or                            therapeutic service that the sedation supports,                            requiring the presence of an independent trained                            observer to assist in the monitoring of the                            patient's level of consciousness and physiological                            status; initial 15 minutes of intraservice time,                            patient age 44 years or older Diagnosis Code(s):         --- Professional ---                           K22.2, Esophageal obstruction  R13.10, Dysphagia, unspecified CPT copyright 2016 [employer]. All rights reserved. The codes documented in this report are preliminary and upon coder review may  be revised to meet current compliance requirements. Iva Boop, MD 02/01/2017 10:17:43 AM This report has been signed electronically. Number of Addenda: 0

## 2017-02-01 NOTE — Progress Notes (Signed)
PROGRESS NOTE    Catherine Hubbard  GSU:[id number] DOB: 05-08-1961 DOA: 01/29/2017 PCP: Darrick Grinder, [facility]   Brief Narrative: Catherine Hubbard is a 56 y.o. female with a history of hypertension, asthma, MS. She presented with worsening nausea and vomiting. She is being treated for intractable nausea and vomiting. She has been worked up by GI as an outpatient and found to have an esophageal ulcer with negative biopsies. She was also found to have evidence of cystitis with intraluminal air; urology consulted with no recommendations. Vomiting persistent so GI was consulted. Underwent EGD with dilatation on 6/15 with plan for repeat on 6/18.   Assessment & Plan:   Active Problems:   Essential hypertension   Nausea & vomiting   Hypokalemia   Acute kidney injury (HCC)   Cystitis   Intractable nausea and vomiting   Dysphagia   Severe protein-calorie malnutrition (HCC)   Esophageal stricture   Intractable nausea/vomiting Chronic issue with significant exacerbation. S/p dilatation 6/15.  -GI recommendations: dilatation today and repeat planned for 6/18 -continue antiemetics -continue scopolamine -diet per GI  Essential hypertension Moderately controlled. -continue to hold antihypertensives  Esophageal ulcer Seen on EGD per chart review from [facility]. Patient started on high dose PPI. -continue Protonix  Hypokalemia Recurrent. -replete -check magnesium  Cystitis With associated intraluminal air. Urology consulted in the ED and advised no surgical interventions. Urine culture significant for pan-sensitive e. coli -continue ceftriaxone until able to consistently tolerate oral nutrition  Multiple sclerosis -continue Rebif  Depression -continue Bupropion and Cymbalta   DVT prophylaxis: Lovenox Code Status: Full code Family Communication: None at bedside Disposition Plan: Discharge pending GI workup   Consultants:    Gastroenterology  Procedures:   EGD w/ dilatation (6/15)  Antimicrobials:   Ceftriaxone  (6/13>>   Subjective: Patient reports no nausea today with limited clear intake. Some mild throat discomfort.  Objective: Vitals:   02/01/17 0859 02/01/17 0900 02/01/17 0910 02/01/17 0915  BP: (!) 151/91 (!) 153/91 (!) 139/96 (!) 137/94  Pulse: 66 66 65 79  Resp: 15 12 15  (!) 24  Temp:      TempSrc:  Oral    SpO2: (!) 85% 93% 91% 94%  Weight:      Height:        Intake/Output Summary (Last 24 hours) at 02/01/17 1030 Last data filed at 02/01/17 0643  Gross per 24 hour  Intake          1597.66 ml  Output              500 ml  Net          1097.66 ml   Filed Weights   01/30/17 0346 01/31/17 2107 02/01/17 0735  Weight: 60.9 kg (134 lb 4.8 oz) 63.5 kg (139 lb 14.4 oz) 63 kg (139 lb)    Examination:  General exam: Appears calm and comfortable  Respiratory system: Clear to auscultation. Respiratory effort normal. Cardiovascular system: S1 & S2 heard, RRR. No murmurs. Gastrointestinal system: Abdomen is nondistended, soft and nontender. Normal bowel sounds heard. Central nervous system: Alert and oriented. No focal neurological deficits. Extremities: No edema. No calf tenderness Skin: No cyanosis. No rashes Psychiatry: Judgement and insight appear normal. Mood & affect appropriate.     Data Reviewed: I have personally reviewed following labs and imaging studies  CBC:  Recent Labs Lab 01/29/17 1932 01/30/17 0423  WBC 7.4 5.7  HGB 15.5* 13.3  HCT 45.2 38.8  MCV 94.2 94.2  PLT 392 290   Basic Metabolic Panel:  Recent Labs Lab 01/29/17 1932 01/30/17 0423 01/30/17 1123 01/31/17 0243 02/01/17 0430  NA 134*  --  136 139 140  K 2.4*  --  3.2* 3.5 2.8*  CL 90*  --  100* 104 104  CO2 27  --  27 27 28   GLUCOSE 122*  --  63* 75 82  BUN 21*  --  20 15 6   CREATININE 1.46* 1.31* 1.11* 0.85 0.74  CALCIUM 9.7  --  8.7* 8.7* 8.6*  MG 1.7  --   --   --   --     GFR: Estimated Creatinine Clearance: 70.7 mL/min (by C-G formula based on SCr of 0.74 mg/dL). Liver Function Tests:  Recent Labs Lab 01/29/17 1932  AST 27  ALT 13*  ALKPHOS 62  BILITOT 1.2  PROT 7.8  ALBUMIN 3.9    Recent Labs Lab 01/29/17 1932  LIPASE 59*   No results for input(s): AMMONIA in the last 168 hours. Coagulation Profile: No results for input(s): INR, PROTIME in the last 168 hours. Cardiac Enzymes: No results for input(s): CKTOTAL, CKMB, CKMBINDEX, TROPONINI in the last 168 hours. BNP (last 3 results) No results for input(s): PROBNP in the last 8760 hours. HbA1C: No results for input(s): HGBA1C in the last 72 hours. CBG:  Recent Labs Lab 01/29/17 2002  GLUCAP 106*   Lipid Profile: No results for input(s): CHOL, HDL, LDLCALC, TRIG, CHOLHDL, LDLDIRECT in the last 72 hours. Thyroid Function Tests: No results for input(s): TSH, T4TOTAL, FREET4, T3FREE, THYROIDAB in the last 72 hours. Anemia Panel: No results for input(s): VITAMINB12, FOLATE, FERRITIN, TIBC, IRON, RETICCTPCT in the last 72 hours. Sepsis Labs: No results for input(s): PROCALCITON, LATICACIDVEN in the last 168 hours.  Recent Results (from the past 240 hour(s))  Urine culture     Status: Abnormal   Collection Time: 01/30/17  5:41 AM  Result Value Ref Range Status   Specimen Description URINE, RANDOM  Final   Special Requests NONE  Final   Culture 80,000 COLONIES/mL ESCHERICHIA COLI (A)  Final   Report Status 02/01/2017 FINAL  Final   Organism ID, Bacteria ESCHERICHIA COLI (A)  Final      Susceptibility   Escherichia coli - MIC*    AMPICILLIN 4 SENSITIVE Sensitive     CEFAZOLIN <=4 SENSITIVE Sensitive     CEFTRIAXONE <=1 SENSITIVE Sensitive     CIPROFLOXACIN <=0.25 SENSITIVE Sensitive     GENTAMICIN <=1 SENSITIVE Sensitive     IMIPENEM <=0.25 SENSITIVE Sensitive     NITROFURANTOIN <=16 SENSITIVE Sensitive     TRIMETH/SULFA <=20 SENSITIVE Sensitive     AMPICILLIN/SULBACTAM <=2  SENSITIVE Sensitive     PIP/TAZO <=4 SENSITIVE Sensitive     Extended ESBL NEGATIVE Sensitive     * 80,000 COLONIES/mL ESCHERICHIA COLI         Radiology Studies: No results found.      Scheduled Meds: . buPROPion  300 mg Oral Daily  . DULoxetine  60 mg Oral Daily  . enoxaparin (LOVENOX) injection  40 mg Subcutaneous Q24H  . pantoprazole (PROTONIX) IV  40 mg Intravenous Q12H  . promethazine  12.5 mg Intravenous Q6H  . scopolamine  1 patch Transdermal Q72H  . sucralfate  1 g Oral TID WC & HS   Continuous Infusions: . sodium chloride 100 mL/hr at 02/01/17 1006  . cefTRIAXone (ROCEPHIN)  IV Stopped (02/01/17 0532)     LOS: 1 day  Jacquelin Hawking, MD [facility] 02/01/2017, 10:30 AM Pager: [phone]  If 7PM-7AM, please contact night-coverage [email or web address] Password [email or web address] 02/01/2017, 10:30 AM

## 2017-02-01 NOTE — Brief Op Note (Signed)
01/29/2017 - 02/01/2017  8:57 AM  PATIENT:  Catherine Hubbard  56 y.o. female  PRE-OPERATIVE DIAGNOSIS:  dysphagia and vomiting  POST-OPERATIVE DIAGNOSIS:  Severe stricture, dilated up to 9.  Unable to pass stricture  PROCEDURE:  Procedure(s): ESOPHAGOGASTRODUODENOSCOPY (EGD) (N/A)  SURGEON:  Surgeon(s) and Role:    * Iva Boop, MD - Primary   ANESTHESIA:   IV sedation and topical  Cetacaine spray, Fentanyl 37.5 ug and Versed 3 mg IV - 19 mins sedation time  EBL:  No intake/output data recorded.   Severe inflammatory stricture at GE junction - dilated 6,7,8,9 mm w/ balloon. Sig better but still tight. Could then see into sleeve/stomach but could not pass even w/ pediatric gastroscope.  Plan for IV PPI May need Zegerid powder as outpt Repeat dilation 6/18 Dr. Russella Dar Clears today ? Full liquid bariatric tomorrow

## 2017-02-01 NOTE — Interval H&P Note (Signed)
History and Physical Interval Note:  02/01/2017 8:02 AM  Catherine Hubbard  has presented today for surgery, with the diagnosis of dysphagia and vomiting  The various methods of treatment have been discussed with the patient and family. After consideration of risks, benefits and other options for treatment, the patient has consented to  Procedure(s): ESOPHAGOGASTRODUODENOSCOPY (EGD) (N/A) as a surgical intervention .  The patient's history has been reviewed, patient examined, no change in status, stable for surgery.  I have reviewed the patient's chart and labs.  Questions were answered to the patient's satisfaction.     Stan Head

## 2017-02-02 ENCOUNTER — Encounter (HOSPITAL_COMMUNITY): Payer: Self-pay | Admitting: Internal Medicine

## 2017-02-02 DIAGNOSIS — R51 Headache: Secondary | ICD-10-CM

## 2017-02-02 LAB — BASIC METABOLIC PANEL
Anion gap: 4 — ABNORMAL LOW (ref 5–15)
CALCIUM: 8.1 mg/dL — AB (ref 8.9–10.3)
CO2: 28 mmol/L (ref 22–32)
CREATININE: 0.77 mg/dL (ref 0.44–1.00)
Chloride: 107 mmol/L (ref 101–111)
GFR calc Af Amer: >60 mL/min (ref >60)
Glucose, Bld: 86 mg/dL (ref 65–99)
POTASSIUM: 3.5 mmol/L (ref 3.5–5.1)
Sodium: 139 mmol/L (ref 135–145)

## 2017-02-02 MED ORDER — SODIUM CHLORIDE 0.45 % IV SOLN
INTRAVENOUS | Status: DC
  Administered 2017-02-02 – 2017-02-04 (×5): via INTRAVENOUS

## 2017-02-02 MED ORDER — TRAMADOL HCL 50 MG PO TABS
50.0000 mg | ORAL_TABLET | Freq: Four times a day (QID) | ORAL | Status: DC | PRN
  Administered 2017-02-02 (×2): 50 mg via ORAL
  Filled 2017-02-02 (×3): qty 1

## 2017-02-02 MED ORDER — MAGNESIUM SULFATE 2 GM/50ML IV SOLN
2.0000 g | Freq: Once | INTRAVENOUS | Status: AC
  Administered 2017-02-02: 2 g via INTRAVENOUS
  Filled 2017-02-02: qty 50

## 2017-02-02 NOTE — Progress Notes (Signed)
Patient is complaining of H/A. No current PRN order. MD notified. Will continue to monitor.

## 2017-02-02 NOTE — Progress Notes (Signed)
PROGRESS NOTE    Catherine Cole Dinino  ZOX:[id number] DOB: Mar 10, 1961 DOA: 01/29/2017 PCP: Darrick Grinder, [facility]   Brief Narrative: Catherine Hubbard is a 56 y.o. female with a history of hypertension, asthma, MS. She presented with worsening nausea and vomiting. She is being treated for intractable nausea and vomiting. She has been worked up by GI as an outpatient and found to have an esophageal ulcer with negative biopsies. She was also found to have evidence of cystitis with intraluminal air; urology consulted with no recommendations. Vomiting persistent so GI was consulted. Underwent EGD with dilatation on 6/15 with plan for repeat on 6/18.   Assessment & Plan:   Active Problems:   Essential hypertension   Nausea & vomiting   Hypokalemia   Acute kidney injury (HCC)   Cystitis   Intractable nausea and vomiting   Dysphagia   Severe protein-calorie malnutrition (HCC)   Esophageal stricture   Intractable nausea/vomiting Chronic issue with significant exacerbation. S/p dilatation 6/15. Symptoms improved. -GI recommendations: repeat dilatation planned for 6/18 -continue antiemetics -continue scopolamine -diet per GI  Essential hypertension Mostly controlled. -continue to hold antihypertensives  Esophageal ulcer Seen on EGD per chart review from [facility]. Patient started on high dose PPI. -continue Protonix  Hypokalemia Hypomagnesemia Replace magnesium  Cystitis With associated intraluminal air. Urology consulted in the ED and advised no surgical interventions. Urine culture significant for pan-sensitive e. coli -continue ceftriaxone  Multiple sclerosis -continue Rebif  Depression -continue Bupropion and Cymbalta  Headache -Tramadol prn   DVT prophylaxis: Lovenox Code Status: Full code Family Communication: None at bedside Disposition Plan: Discharge home likely in 48-72 hours pending GI workup   Consultants:    Gastroenterology  Procedures:   EGD w/ dilatation (6/15)  Antimicrobials:   Ceftriaxone  (6/13>>   Subjective: Mild headache. No nausea or vomiting today. No abdominal pain  Objective: Vitals:   02/01/17 0915 02/01/17 2130 02/02/17 0449 02/02/17 0848  BP: (!) 137/94 (!) 145/90 115/77 (!) 122/41  Pulse: 79 71 75 81  Resp: (!) 24 17 16 16   Temp:  98.8 F (37.1 C) 97.5 F (36.4 C) 98.1 F (36.7 C)  TempSrc:  Oral Oral Oral  SpO2: 94% 100% 97% 97%  Weight:  64.1 kg (141 lb 5 oz)    Height:        Intake/Output Summary (Last 24 hours) at 02/02/17 1133 Last data filed at 02/02/17 8119  Gross per 24 hour  Intake             2290 ml  Output              700 ml  Net             1590 ml   Filed Weights   01/31/17 2107 02/01/17 0735 02/01/17 2130  Weight: 63.5 kg (139 lb 14.4 oz) 63 kg (139 lb) 64.1 kg (141 lb 5 oz)    Examination:  General exam: Appears calm and comfortable  Gastrointestinal system: Abdomen is nondistended, soft and nontender. Normal bowel sounds heard. Central nervous system: Alert and oriented. No focal neurological deficits. Extremities: No edema. No calf tenderness Skin: No cyanosis. No rashes Psychiatry: Judgement and insight appear normal. Mood & affect appropriate.     Data Reviewed: I have personally reviewed following labs and imaging studies  CBC:  Recent Labs Lab 01/29/17 1932 01/30/17 0423  WBC 7.4 5.7  HGB 15.5* 13.3  HCT 45.2 38.8  MCV 94.2 94.2  PLT 392  290   Basic Metabolic Panel:  Recent Labs Lab 01/29/17 1932 01/30/17 0423 01/30/17 1123 01/31/17 0243 02/01/17 0430 02/01/17 1039 02/02/17 0437  NA 134*  --  136 139 140  --  139  K 2.4*  --  3.2* 3.5 2.8*  --  3.5  CL 90*  --  100* 104 104  --  107  CO2 27  --  27 27 28   --  28  GLUCOSE 122*  --  63* 75 82  --  86  BUN 21*  --  20 15 6   --  <5*  CREATININE 1.46* 1.31* 1.11* 0.85 0.74  --  0.77  CALCIUM 9.7  --  8.7* 8.7* 8.6*  --  8.1*  MG 1.7  --   --    --   --  1.3*  --    GFR: Estimated Creatinine Clearance: 70.7 mL/min (by C-G formula based on SCr of 0.77 mg/dL). Liver Function Tests:  Recent Labs Lab 01/29/17 1932  AST 27  ALT 13*  ALKPHOS 62  BILITOT 1.2  PROT 7.8  ALBUMIN 3.9    Recent Labs Lab 01/29/17 1932  LIPASE 59*   No results for input(s): AMMONIA in the last 168 hours. Coagulation Profile: No results for input(s): INR, PROTIME in the last 168 hours. Cardiac Enzymes: No results for input(s): CKTOTAL, CKMB, CKMBINDEX, TROPONINI in the last 168 hours. BNP (last 3 results) No results for input(s): PROBNP in the last 8760 hours. HbA1C: No results for input(s): HGBA1C in the last 72 hours. CBG:  Recent Labs Lab 01/29/17 2002  GLUCAP 106*   Lipid Profile: No results for input(s): CHOL, HDL, LDLCALC, TRIG, CHOLHDL, LDLDIRECT in the last 72 hours. Thyroid Function Tests: No results for input(s): TSH, T4TOTAL, FREET4, T3FREE, THYROIDAB in the last 72 hours. Anemia Panel: No results for input(s): VITAMINB12, FOLATE, FERRITIN, TIBC, IRON, RETICCTPCT in the last 72 hours. Sepsis Labs: No results for input(s): PROCALCITON, LATICACIDVEN in the last 168 hours.  Recent Results (from the past 240 hour(s))  Urine culture     Status: Abnormal   Collection Time: 01/30/17  5:41 AM  Result Value Ref Range Status   Specimen Description URINE, RANDOM  Final   Special Requests NONE  Final   Culture 80,000 COLONIES/mL ESCHERICHIA COLI (A)  Final   Report Status 02/01/2017 FINAL  Final   Organism ID, Bacteria ESCHERICHIA COLI (A)  Final      Susceptibility   Escherichia coli - MIC*    AMPICILLIN 4 SENSITIVE Sensitive     CEFAZOLIN <=4 SENSITIVE Sensitive     CEFTRIAXONE <=1 SENSITIVE Sensitive     CIPROFLOXACIN <=0.25 SENSITIVE Sensitive     GENTAMICIN <=1 SENSITIVE Sensitive     IMIPENEM <=0.25 SENSITIVE Sensitive     NITROFURANTOIN <=16 SENSITIVE Sensitive     TRIMETH/SULFA <=20 SENSITIVE Sensitive      AMPICILLIN/SULBACTAM <=2 SENSITIVE Sensitive     PIP/TAZO <=4 SENSITIVE Sensitive     Extended ESBL NEGATIVE Sensitive     * 80,000 COLONIES/mL ESCHERICHIA COLI         Radiology Studies: No results found.      Scheduled Meds: . buPROPion  300 mg Oral Daily  . DULoxetine  60 mg Oral Daily  . enoxaparin (LOVENOX) injection  40 mg Subcutaneous Q24H  . feeding supplement  1 Container Oral TID BM  . pantoprazole (PROTONIX) IV  40 mg Intravenous Q12H  . promethazine  12.5 mg Intravenous Q6H  .  protein supplement shake  11 oz Oral TID BM  . scopolamine  1 patch Transdermal Q72H  . sucralfate  1 g Oral TID WC & HS   Continuous Infusions: . sodium chloride    . cefTRIAXone (ROCEPHIN)  IV Stopped (02/02/17 0703)  . magnesium sulfate 1 - 4 g bolus IVPB       LOS: 2 days     Jacquelin Hawking, MD [facility] 02/02/2017, 11:33 AM Pager: [phone]  If 7PM-7AM, please contact night-coverage [email or web address] Password [email or web address] 02/02/2017, 11:33 AM

## 2017-02-02 NOTE — Progress Notes (Signed)
Patient complained of headache - rating as 8/10.  She felt that she could not take any medications by mouth.  MD called.  Received order for Dilaudid IV x 1.  This was given at 2247 with excellent relief.  Will continue to monitor patient.  Bernie Covey RN-BC, [employer]

## 2017-02-03 MED ORDER — BUTALBITAL-APAP-CAFFEINE 50-325-40 MG PO TABS
1.0000 | ORAL_TABLET | Freq: Four times a day (QID) | ORAL | Status: DC | PRN
  Administered 2017-02-03: 1 via ORAL
  Filled 2017-02-03: qty 1

## 2017-02-03 MED ORDER — BUTALBITAL-APAP-CAFFEINE 50-325-40 MG PO TABS
1.0000 | ORAL_TABLET | ORAL | Status: AC | PRN
  Administered 2017-02-03 (×2): 1 via ORAL
  Filled 2017-02-03 (×2): qty 1

## 2017-02-03 NOTE — Progress Notes (Signed)
PROGRESS NOTE    Catherine Hubbard  ZOX:[id number] DOB: 1960/09/16 DOA: 01/29/2017 PCP: Darrick Grinder, [facility]   Brief Narrative: Catherine Hubbard is a 56 y.o. female with a history of hypertension, asthma, MS. She presented with worsening nausea and vomiting. She is being treated for intractable nausea and vomiting. She has been worked up by GI as an outpatient and found to have an esophageal ulcer with negative biopsies. She was also found to have evidence of cystitis with intraluminal air; urology consulted with no recommendations. Vomiting persistent so GI was consulted. Underwent EGD with dilatation on 6/15 with plan for repeat on 6/18.   Assessment & Plan:   Active Problems:   Essential hypertension   Nausea & vomiting   Hypokalemia   Acute kidney injury (HCC)   Cystitis   Intractable nausea and vomiting   Dysphagia   Severe protein-calorie malnutrition (HCC)   Esophageal stricture   Intractable nausea/vomiting Chronic issue with significant exacerbation. S/p dilatation 6/15. Symptoms improved. -GI recommendations: repeat dilatation planned for 6/18 -continue antiemetics -continue scopolamine -diet per GI  Essential hypertension Mostly controlled. -continue to hold antihypertensives  Esophageal ulcer Seen on EGD per chart review from [facility]. Patient started on high dose PPI. -continue Protonix  Hypokalemia Hypomagnesemia Replace magnesium  Cystitis With associated intraluminal air. Urology consulted in the ED and advised no surgical interventions. Urine culture significant for pan-sensitive e. coli -continue ceftriaxone  Multiple sclerosis -continue Rebif  Depression -continue Bupropion and Cymbalta  Headache -Tramadol prn   DVT prophylaxis: Lovenox Code Status: Full code Family Communication: husband at bedside Disposition Plan: Discharge home likely in 24-48 hours pending GI workup   Consultants:    Gastroenterology  Procedures:   EGD w/ dilatation (6/15)  Antimicrobials:   Ceftriaxone  (6/13>>   Subjective: Resolution of nausea. Tolerating more by mouth.  Objective: Vitals:   02/02/17 1851 02/02/17 2148 02/03/17 0617 02/03/17 0939  BP: (!) 144/89 (!) 139/92 123/79 138/85  Pulse: 77 72 63 61  Resp: 16 17 18 18   Temp: 98.4 F (36.9 C) 98.1 F (36.7 C) 98.2 F (36.8 C) 98.4 F (36.9 C)  TempSrc: Oral Oral Oral Oral  SpO2: 98% 99% 99% 99%  Weight:  64.5 kg (142 lb 3.2 oz)    Height:        Intake/Output Summary (Last 24 hours) at 02/03/17 1418 Last data filed at 02/03/17 1210  Gross per 24 hour  Intake          3821.67 ml  Output             1125 ml  Net          2696.67 ml   Filed Weights   02/01/17 0735 02/01/17 2130 02/02/17 2148  Weight: 63 kg (139 lb) 64.1 kg (141 lb 5 oz) 64.5 kg (142 lb 3.2 oz)    Examination:  General exam: Appears calm and comfortable  Central nervous system: Alert and oriented. No focal neurological deficits. Skin: No cyanosis. No rashes Psychiatry: Judgement and insight appear normal. Mood & affect appropriate.     Data Reviewed: I have personally reviewed following labs and imaging studies  CBC:  Recent Labs Lab 01/29/17 1932 01/30/17 0423  WBC 7.4 5.7  HGB 15.5* 13.3  HCT 45.2 38.8  MCV 94.2 94.2  PLT 392 290   Basic Metabolic Panel:  Recent Labs Lab 01/29/17 1932 01/30/17 0423 01/30/17 1123 01/31/17 0243 02/01/17 0430 02/01/17 1039 02/02/17 0437  NA 134*  --  136 139 140  --  139  K 2.4*  --  3.2* 3.5 2.8*  --  3.5  CL 90*  --  100* 104 104  --  107  CO2 27  --  27 27 28   --  28  GLUCOSE 122*  --  63* 75 82  --  86  BUN 21*  --  20 15 6   --  <5*  CREATININE 1.46* 1.31* 1.11* 0.85 0.74  --  0.77  CALCIUM 9.7  --  8.7* 8.7* 8.6*  --  8.1*  MG 1.7  --   --   --   --  1.3*  --    GFR: Estimated Creatinine Clearance: 70.7 mL/min (by C-G formula based on SCr of 0.77 mg/dL). Liver Function  Tests:  Recent Labs Lab 01/29/17 1932  AST 27  ALT 13*  ALKPHOS 62  BILITOT 1.2  PROT 7.8  ALBUMIN 3.9    Recent Labs Lab 01/29/17 1932  LIPASE 59*   No results for input(s): AMMONIA in the last 168 hours. Coagulation Profile: No results for input(s): INR, PROTIME in the last 168 hours. Cardiac Enzymes: No results for input(s): CKTOTAL, CKMB, CKMBINDEX, TROPONINI in the last 168 hours. BNP (last 3 results) No results for input(s): PROBNP in the last 8760 hours. HbA1C: No results for input(s): HGBA1C in the last 72 hours. CBG:  Recent Labs Lab 01/29/17 2002  GLUCAP 106*   Lipid Profile: No results for input(s): CHOL, HDL, LDLCALC, TRIG, CHOLHDL, LDLDIRECT in the last 72 hours. Thyroid Function Tests: No results for input(s): TSH, T4TOTAL, FREET4, T3FREE, THYROIDAB in the last 72 hours. Anemia Panel: No results for input(s): VITAMINB12, FOLATE, FERRITIN, TIBC, IRON, RETICCTPCT in the last 72 hours. Sepsis Labs: No results for input(s): PROCALCITON, LATICACIDVEN in the last 168 hours.  Recent Results (from the past 240 hour(s))  Urine culture     Status: Abnormal   Collection Time: 01/30/17  5:41 AM  Result Value Ref Range Status   Specimen Description URINE, RANDOM  Final   Special Requests NONE  Final   Culture 80,000 COLONIES/mL ESCHERICHIA COLI (A)  Final   Report Status 02/01/2017 FINAL  Final   Organism ID, Bacteria ESCHERICHIA COLI (A)  Final      Susceptibility   Escherichia coli - MIC*    AMPICILLIN 4 SENSITIVE Sensitive     CEFAZOLIN <=4 SENSITIVE Sensitive     CEFTRIAXONE <=1 SENSITIVE Sensitive     CIPROFLOXACIN <=0.25 SENSITIVE Sensitive     GENTAMICIN <=1 SENSITIVE Sensitive     IMIPENEM <=0.25 SENSITIVE Sensitive     NITROFURANTOIN <=16 SENSITIVE Sensitive     TRIMETH/SULFA <=20 SENSITIVE Sensitive     AMPICILLIN/SULBACTAM <=2 SENSITIVE Sensitive     PIP/TAZO <=4 SENSITIVE Sensitive     Extended ESBL NEGATIVE Sensitive     * 80,000  COLONIES/mL ESCHERICHIA COLI         Radiology Studies: No results found.      Scheduled Meds: . buPROPion  300 mg Oral Daily  . DULoxetine  60 mg Oral Daily  . pantoprazole (PROTONIX) IV  40 mg Intravenous Q12H  . promethazine  12.5 mg Intravenous Q6H  . protein supplement shake  11 oz Oral TID BM  . scopolamine  1 patch Transdermal Q72H  . sucralfate  1 g Oral TID WC & HS   Continuous Infusions: . sodium chloride 100 mL/hr at 02/03/17 0916  . cefTRIAXone (ROCEPHIN)  IV Stopped (02/03/17 0854)  LOS: 3 days     Jacquelin Hawking, MD [facility] 02/03/2017, 2:18 PM Pager: [phone]  If 7PM-7AM, please contact night-coverage [email or web address] Password [email or web address] 02/03/2017, 2:18 PM

## 2017-02-03 NOTE — Progress Notes (Signed)
Patient is complaining of headache unrelieved by Tramadol. MD notified X2. Orders placed. Will continue to monitor.

## 2017-02-03 NOTE — Progress Notes (Signed)
Subjective: Swallowing is much better, but she had some problems with a pill today.  Objective: Vital signs in last 24 hours: Temp:  [98.1 F (36.7 C)-98.4 F (36.9 C)] 98.4 F (36.9 C) (06/17 0939) Pulse Rate:  [61-77] 61 (06/17 0939) Resp:  [16-18] 18 (06/17 0939) BP: (123-144)/(79-92) 138/85 (06/17 0939) SpO2:  [98 %-99 %] 99 % (06/17 0939) Weight:  [64.5 kg (142 lb 3.2 oz)] 64.5 kg (142 lb 3.2 oz) (06/16 2148) Last BM Date: 02/02/17  Intake/Output from previous day: 06/16 0701 - 06/17 0700 In: 3345 [P.O.:1500; I.V.:1845] Out: 1125 [Urine:1125] Intake/Output this shift: Total I/O In: 1203.3 [P.O.:240; I.V.:963.3] Out: -   General appearance: alert and no distress GI: soft, non-tender; bowel sounds normal; no masses,  no organomegaly  Lab Results: No results for input(s): WBC, HGB, HCT, PLT in the last 72 hours. BMET  Recent Labs  02/01/17 0430 02/02/17 0437  NA 140 139  K 2.8* 3.5  CL 104 107  CO2 28 28  GLUCOSE 82 86  BUN 6 <5*  CREATININE 0.74 0.77  CALCIUM 8.6* 8.1*   LFT No results for input(s): PROT, ALBUMIN, AST, ALT, ALKPHOS, BILITOT, BILIDIR, IBILI in the last 72 hours. PT/INR No results for input(s): LABPROT, INR in the last 72 hours. Hepatitis Panel No results for input(s): HEPBSAG, HCVAB, HEPAIGM, HEPBIGM in the last 72 hours. C-Diff No results for input(s): CDIFFTOX in the last 72 hours. Fecal Lactopherrin No results for input(s): FECLLACTOFRN in the last 72 hours.  Studies/Results: No results found.  Medications:  Scheduled: . buPROPion  300 mg Oral Daily  . DULoxetine  60 mg Oral Daily  . pantoprazole (PROTONIX) IV  40 mg Intravenous Q12H  . promethazine  12.5 mg Intravenous Q6H  . protein supplement shake  11 oz Oral TID BM  . scopolamine  1 patch Transdermal Q72H  . sucralfate  1 g Oral TID WC & HS   Continuous: . sodium chloride 100 mL/hr at 02/03/17 0916  . cefTRIAXone (ROCEPHIN)  IV Stopped (02/03/17 0854)     Assessment/Plan: 1) Distal esophageal stricture. 2) Dysphagia.   She is clinically improved, but further dilation is required.  Plan: 1) EGD tomorrow as scheduled.  LOS: 3 days   Loghan Subia D 02/03/2017, 4:03 PM  

## 2017-02-04 ENCOUNTER — Inpatient Hospital Stay (HOSPITAL_COMMUNITY): Payer: Federal, State, Local not specified - PPO | Admitting: Anesthesiology

## 2017-02-04 ENCOUNTER — Encounter (HOSPITAL_COMMUNITY): Payer: Self-pay

## 2017-02-04 ENCOUNTER — Encounter (HOSPITAL_COMMUNITY)
Admission: EM | Disposition: A | Discharge status: Home / Self Care | Payer: Self-pay | Source: Home / Self Care | Attending: Family Medicine

## 2017-02-04 DIAGNOSIS — T18108A Unspecified foreign body in esophagus causing other injury, initial encounter: Secondary | ICD-10-CM

## 2017-02-04 HISTORY — PX: ESOPHAGOGASTRODUODENOSCOPY (EGD) WITH PROPOFOL: SHX5813

## 2017-02-04 SURGERY — ESOPHAGOGASTRODUODENOSCOPY (EGD) WITH PROPOFOL
Anesthesia: Monitor Anesthesia Care

## 2017-02-04 MED ORDER — SUCRALFATE 1 GM/10ML PO SUSP: 1.0000 g | Freq: Three times a day (TID) | ORAL | 0 refills | Status: DC

## 2017-02-04 MED ORDER — LACTATED RINGERS IV SOLN
INTRAVENOUS | Status: DC | PRN
  Administered 2017-02-04: 10:00:00 via INTRAVENOUS

## 2017-02-04 MED ORDER — BUTAMBEN-TETRACAINE-BENZOCAINE 2-2-14 % EX AERO
INHALATION_SPRAY | CUTANEOUS | Status: DC | PRN
  Administered 2017-02-04: 2 via TOPICAL

## 2017-02-04 MED ORDER — PROPOFOL 500 MG/50ML IV EMUL
INTRAVENOUS | Status: DC | PRN
  Administered 2017-02-04: 100 ug/kg/min via INTRAVENOUS

## 2017-02-04 MED ORDER — PREMIER PROTEIN SHAKE: 11.0000 [oz_av] | Freq: Three times a day (TID) | ORAL | 0 refills | Status: DC

## 2017-02-04 MED ORDER — BUPROPION HCL 100 MG PO TABS: 100.0000 mg | ORAL_TABLET | Freq: Three times a day (TID) | ORAL | 0 refills | Status: DC

## 2017-02-04 MED ORDER — PROPOFOL 10 MG/ML IV BOLUS
INTRAVENOUS | Status: DC | PRN
  Administered 2017-02-04 (×3): 20 mg via INTRAVENOUS

## 2017-02-04 MED ORDER — FLUOXETINE HCL 20 MG PO TABS: 20.0000 mg | ORAL_TABLET | Freq: Every day | ORAL | 0 refills | Status: DC

## 2017-02-04 SURGICAL SUPPLY — 14 items

## 2017-02-04 NOTE — Discharge Instructions (Signed)
Catherine Hubbard,  You were admitted for trouble swallowing. Your esophagus was dilated. Please stick to a full liquid diet in addition to crushing tablets and opening capsule medications until you see your gastroenterologist.

## 2017-02-04 NOTE — Op Note (Addendum)
[facility] Patient Name: Catherine Hubbard Procedure Date : 02/04/2017 MRN: [id number] Attending MD: Ladene Artist , MD Date of Birth: 01/22/1961 CSN: [id number] Age: 56 Admit Type: Inpatient Procedure:                Upper GI endoscopy Indications:              Dysphagia, For therapy of esophageal stricture Providers:                Pricilla Riffle. Fuller Plan, MD, Cleda Daub, RN, Alan Mulder, Technician Referring MD:             [facility] Medicines:                Monitored Anesthesia Care Complications:            No immediate complications. Estimated blood loss:                            None. Estimated Blood Loss:     Estimated blood loss was minimal. Procedure:                Pre-Anesthesia Assessment:                           - Prior to the procedure, a History and Physical                            was performed, and patient medications and                            allergies were reviewed. The patient's tolerance of                            previous anesthesia was also reviewed. The risks                            and benefits of the procedure and the sedation                            options and risks were discussed with the patient.                            All questions were answered, and informed consent                            was obtained. Prior Anticoagulants: The patient has                            taken Eliquis (apixaban), last dose was 4 days                            prior to procedure. ASA Grade Assessment: II - A  patient with mild systemic disease. After reviewing                            the risks and benefits, the patient was deemed in                            satisfactory condition to undergo the procedure.                           After obtaining informed consent, the endoscope was                            passed under direct vision. Throughout the                 procedure, the patient's blood pressure, pulse, and                            oxygen saturations were monitored continuously. The                            Endosonoscope was introduced through the mouth, and                            advanced to the lower third of esophagus. The upper                            GI endoscopy was technically difficult and complex                            due to stricture. The patient tolerated the                            procedure well. Scope In: Scope Out: Findings:      LA Grade C (one or more mucosal breaks continuous between tops of 2 or       more mucosal folds, less than 75% circumference) esophagitis with no       bleeding was found in the distal esophagus.      One severe benign-appearing, intrinsic stenosis was found at the       gastroesophageal junction. This measured 7 mm (inner diameter) and was       not traversed. A TTS dilator was passed through the scope. Dilation with       a 05-31-11 mm balloon dilator was performed to 12 mm. Small amount of       heme noted. The stricture measured about 9 mm post dilation however the       adult endoscope would not easily pass.      Pills were found in the distal esophagus. Removal was accomplished with       a Roth net.      The exam of the esophagus was otherwise normal. Impression:               - LA Grade C reflux esophagitis.                           -  Benign-appearing esophageal stenosis. Dilated.                           - Pills were found in the esophagus. Removal was                            successful. Moderate Sedation:      none/MAC Recommendation:           - Return patient to hospital ward for ongoing care.                           - Clear liquid diet for 2 hours, then advance as                            tolerated to full liquids. Do not advance diet                            beyond full liquids until cleared by her                            [facility], Dr. Margaretmary Bayley                           - Return to GI clinic in 1 week for repeat                            dilation, per Dr. Rolm Bookbinder                           - PPI bid, need to open capsule in applesauce, or                            use Prevacid Solutabs                           - NO pill medications until cleared by her                            gastroenterologist - use only opened capsules or                            liquids medications.                           - OK resume Eliquis or another anticoagulant as                            indicated in 2 days..                           - OK for discharge later today.                           - OK to resume present medications in opened  capsule or liquid form. Procedure Code(s):        --- Professional ---                           [phone], Esophagoscopy, flexible, transoral; with                            removal of foreign body(s)                           43220, Esophagoscopy, flexible, transoral; with                            transendoscopic balloon dilation (less than 30 mm                            diameter) Diagnosis Code(s):        --- Professional ---                           K21.0, Gastro-esophageal reflux disease with                            esophagitis                           K22.2, Esophageal obstruction                           T18.198A, Other foreign object in esophagus causing                            other injury, initial encounter                           R13.10, Dysphagia, unspecified CPT copyright 2016 [employer]. All rights reserved. The codes documented in this report are preliminary and upon coder review may  be revised to meet current compliance requirements. Ladene Artist, MD 02/04/2017 10:37:48 AM This report has been signed electronically. Number of Addenda: 0

## 2017-02-04 NOTE — Progress Notes (Signed)
Patient to be discharged home. Discharge papers and prescription given to patient. Patient made aware to call MD for follow-up,phone number provided in the d/c paper. Prescriptions given. Patient verbalized understanding. 18:40 Patient discharged home accompanied by husband. Jream Broyles, Drinda Butts, Charity fundraiser

## 2017-02-04 NOTE — Progress Notes (Deleted)
Called tele # [phone].  Pt alert and oriented.  BP 177 systollic,  Amlodipine 10 mg po given.  Denies pain. Pleasant.

## 2017-02-04 NOTE — H&P (View-Only) (Signed)
Subjective: Swallowing is much better, but she had some problems with a pill today.  Objective: Vital signs in last 24 hours: Temp:  [98.1 F (36.7 C)-98.4 F (36.9 C)] 98.4 F (36.9 C) (06/17 0939) Pulse Rate:  [61-77] 61 (06/17 0939) Resp:  [16-18] 18 (06/17 0939) BP: (123-144)/(79-92) 138/85 (06/17 0939) SpO2:  [98 %-99 %] 99 % (06/17 0939) Weight:  [64.5 kg (142 lb 3.2 oz)] 64.5 kg (142 lb 3.2 oz) (06/16 2148) Last BM Date: 02/02/17  Intake/Output from previous day: 06/16 0701 - 06/17 0700 In: 3345 [P.O.:1500; I.V.:1845] Out: 1125 [Urine:1125] Intake/Output this shift: Total I/O In: 1203.3 [P.O.:240; I.V.:963.3] Out: -   General appearance: alert and no distress GI: soft, non-tender; bowel sounds normal; no masses,  no organomegaly  Lab Results: No results for input(s): WBC, HGB, HCT, PLT in the last 72 hours. BMET  Recent Labs  02/01/17 0430 02/02/17 0437  NA 140 139  K 2.8* 3.5  CL 104 107  CO2 28 28  GLUCOSE 82 86  BUN 6 <5*  CREATININE 0.74 0.77  CALCIUM 8.6* 8.1*   LFT No results for input(s): PROT, ALBUMIN, AST, ALT, ALKPHOS, BILITOT, BILIDIR, IBILI in the last 72 hours. PT/INR No results for input(s): LABPROT, INR in the last 72 hours. Hepatitis Panel No results for input(s): HEPBSAG, HCVAB, HEPAIGM, HEPBIGM in the last 72 hours. C-Diff No results for input(s): CDIFFTOX in the last 72 hours. Fecal Lactopherrin No results for input(s): FECLLACTOFRN in the last 72 hours.  Studies/Results: No results found.  Medications:  Scheduled: . buPROPion  300 mg Oral Daily  . DULoxetine  60 mg Oral Daily  . pantoprazole (PROTONIX) IV  40 mg Intravenous Q12H  . promethazine  12.5 mg Intravenous Q6H  . protein supplement shake  11 oz Oral TID BM  . scopolamine  1 patch Transdermal Q72H  . sucralfate  1 g Oral TID WC & HS   Continuous: . sodium chloride 100 mL/hr at 02/03/17 0916  . cefTRIAXone (ROCEPHIN)  IV Stopped (02/03/17 0973)     Assessment/Plan: 1) Distal esophageal stricture. 2) Dysphagia.   She is clinically improved, but further dilation is required.  Plan: 1) EGD tomorrow as scheduled.  LOS: 3 days   Catherine Hubbard D 02/03/2017, 4:03 PM

## 2017-02-04 NOTE — Progress Notes (Signed)
Pt gone down for EGD dilatation.

## 2017-02-04 NOTE — Anesthesia Preprocedure Evaluation (Signed)
Anesthesia Evaluation  Patient identified by MRN, date of birth, ID band Patient awake    Reviewed: Allergy & Precautions, H&P , Patient's Chart, lab work & pertinent test results, reviewed documented beta blocker date and time   Airway Mallampati: II  TM Distance: >3 FB Neck ROM: full    Dental no notable dental hx.    Pulmonary former smoker,    Pulmonary exam normal breath sounds clear to auscultation       Cardiovascular hypertension,  Rhythm:regular Rate:Normal     Neuro/Psych    GI/Hepatic   Endo/Other    Renal/GU      Musculoskeletal   Abdominal   Peds  Hematology   Anesthesia Other Findings Multiple sclerosis     Hypertension      Asthma      Reproductive/Obstetrics                             Anesthesia Physical Anesthesia Plan  ASA: II  Anesthesia Plan: MAC   Post-op Pain Management:    Induction: Intravenous  PONV Risk Score and Plan:   Airway Management Planned: Mask and Natural Airway  Additional Equipment:   Intra-op Plan:   Post-operative Plan:   Informed Consent: I have reviewed the patients History and Physical, chart, labs and discussed the procedure including the risks, benefits and alternatives for the proposed anesthesia with the patient or authorized representative who has indicated his/her understanding and acceptance.   Dental Advisory Given  Plan Discussed with: CRNA and Surgeon  Anesthesia Plan Comments:         Anesthesia Quick Evaluation

## 2017-02-04 NOTE — Progress Notes (Signed)
Paged Dr. Mal Misty for medications need to be changed to liquid or crushable form.  Dr. Mal Misty is working with pharmacy to change forms of medications.

## 2017-02-04 NOTE — Interval H&P Note (Signed)
History and Physical Interval Note:  02/04/2017 9:35 AM  Catherine Hubbard  has presented today for surgery, with the diagnosis of Esophageal stricture  The various methods of treatment have been discussed with the patient and family. After consideration of risks, benefits and other options for treatment, the patient has consented to  Procedure(s): ESOPHAGOGASTRODUODENOSCOPY (EGD) WITH PROPOFOL (N/A) as a surgical intervention .  The patient's history has been reviewed, patient examined, no change in status, stable for surgery.  I have reviewed the patient's chart and labs.  Questions were answered to the patient's satisfaction.     Venita Lick. Russella Dar

## 2017-02-04 NOTE — Transfer of Care (Signed)
Immediate Anesthesia Transfer of Care Note  Patient: Catherine Hubbard  Procedure(s) Performed: Procedure(s): ESOPHAGOGASTRODUODENOSCOPY (EGD) WITH PROPOFOL (N/A)  Patient Location: Endoscopy Unit  Anesthesia Type:MAC  Level of Consciousness: awake, alert  and oriented  Airway & Oxygen Therapy: Patient Spontanous Breathing and Patient connected to nasal cannula oxygen  Post-op Assessment: Report given to RN and Post -op Vital signs reviewed and stable  Post vital signs: Reviewed and stable  Last Vitals:  Vitals:   02/03/17 2025 02/04/17 0856  BP: (!) 143/93 (!) 164/97  Pulse: 83 (!) 56  Resp: 18 16  Temp: 37.2 C 36.8 C    Last Pain:  Vitals:   02/04/17 0856  TempSrc: Oral  PainSc:       Patients Stated Pain Goal: 0 (27/67/01 1003)  Complications: No apparent anesthesia complications

## 2017-02-04 NOTE — Discharge Summary (Signed)
Physician Discharge Summary  Catherine Hubbard ZOX:[id number] DOB: 05/25/1961 DOA: 01/29/2017  PCP: Darrick Grinder, [facility]  Admit date: 01/29/2017 Discharge date: 02/06/2017  Admitted From: Home Disposition: Home  Recommendations for Outpatient Follow-up:  1. Follow up with gastroenterologist in one week 2. Full liquid diet 3. No whole pills or capsule medications  Home Health: None Equipment/Devices: None  Discharge Condition: Stable CODE STATUS: Full code Diet recommendation: Full liquid   Brief/Interim Summary:  Admission HPI written by Eduard Clos, MD   Chief Complaint: Nausea vomiting.  HPI: Catherine Hubbard is a 56 y.o. female with history of hypertension, asthma, multiple sclerosis presented to the ER because of worsening nausea vomiting. Patient states she has been having persistent nausea vomiting since February of this year. It started happening after she went to [place]. Patient had a cholecystectomy for the same reason last month despite which patient is still having vomiting. Patient vomits every time she eats. Denies any diarrhea. Has been having crampy epigastric abdominal pain along with vomiting. Since patient's symptoms acutely worsened patient came to the ER. Patient states she is originally scheduled for gastroenterology consult in 2 weeks from now with Dr. Lonna Cobb.   ED Course: In the ER labs reveal possible UTI with acute renal failure, hypokalemia with mildly elevated lipase. CT of the abdomen and pelvis shows cystitis with intraluminal air. ER physician had discussed with Dr. Judie Grieve on-call urologist who at this time advised no surgical indications. Patient is being admitted for persistent nausea vomiting with hypokalemia and acute renal failure.    Hospital course:  Intractable nausea/vomiting Chronic issue with significant exacerbation. S/p dilatation 6/15. Symptoms improved. Underwent subsequent dilatation with foreign  body retrieval (medication pills) on 6/18. Full liquid diet until follows up with her outpatient gastroenterologist.  Essential hypertension Mostly controlled. Continued on discharge.  Esophageal ulcer Seen on EGD per chart review from [facility]. Patient started on high dose PPI. Continued PPI.  Hypokalemia Hypomagnesemia Supplementation given.  Cystitis With associated intraluminal air. Urology consulted in the ED and advised no surgical interventions. Urine culture significant for pan-sensitive e. Coli. Treated with 5 days of ceftriaxone.  Multiple sclerosis Continued Rebif  Depression Continued Bupropion and Cymbalta. Cymbalta discontinued on discharge and switched to Prozac. Bupropion switched to immediate release formulation.  Headache Tramadol as needed. Fioricet added.  Discharge Diagnoses:  Active Problems:   Essential hypertension   Nausea & vomiting   Hypokalemia   Acute kidney injury (HCC)   Cystitis   Intractable nausea and vomiting   Dysphagia   Severe protein-calorie malnutrition (HCC)   Esophageal stricture   Foreign body in esophagus    Discharge Instructions  Discharge Instructions    Call MD for:  difficulty breathing, headache or visual disturbances    Complete by:  As directed    Call MD for:  persistant dizziness or light-headedness    Complete by:  As directed    Call MD for:  persistant nausea and vomiting    Complete by:  As directed    Call MD for:  severe uncontrolled pain    Complete by:  As directed    Increase activity slowly    Complete by:  As directed      Allergies as of 02/04/2017      Reactions   Ibuprofen Other (See Comments)   Cannot take due to gastric bypass   Percocet [oxycodone-acetaminophen] Nausea And Vomiting   Tolerates with nausea medication      Medication  List    STOP taking these medications   baclofen 10 MG tablet Commonly known as:  LIORESAL   buPROPion 300 MG 24 hr tablet Commonly  known as:  WELLBUTRIN XL Replaced by:  buPROPion 100 MG tablet   cyclobenzaprine 5 MG tablet Commonly known as:  FLEXERIL   DULoxetine 60 MG capsule Commonly known as:  CYMBALTA   etodolac 400 MG tablet Commonly known as:  LODINE   modafinil 200 MG tablet Commonly known as:  PROVIGIL   ondansetron 4 MG tablet Commonly known as:  ZOFRAN   oxyCODONE-acetaminophen 5-325 MG tablet Commonly known as:  PERCOCET   pantoprazole 40 MG tablet Commonly known as:  PROTONIX     TAKE these medications   albuterol 108 (90 Base) MCG/ACT inhaler Commonly known as:  PROVENTIL HFA;VENTOLIN HFA Inhale 2 puffs into the lungs every 6 (six) hours as needed for wheezing or shortness of breath.   buPROPion 100 MG tablet Commonly known as:  WELLBUTRIN Take 1 tablet (100 mg total) by mouth 3 (three) times daily. Crush pills Replaces:  buPROPion 300 MG 24 hr tablet   diazepam 5 MG tablet Commonly known as:  VALIUM TAKE 1 TABLET BY MOUTH EVERY 12 HOURS AS NEEDED MUSCLE SPASMS   FLUoxetine 20 MG tablet Commonly known as:  PROZAC Take 1 tablet (20 mg total) by mouth daily.   HYDROcodone-acetaminophen 5-325 MG tablet Commonly known as:  NORCO Take 1 tablet by mouth daily as needed for moderate pain.   Interferon Beta-1a 44 MCG/0.5ML Soaj Commonly known as:  REBIF REBIDOSE Inject 44 mcg into the skin 3 (three) times a week.   nabumetone 500 MG tablet Commonly known as:  RELAFEN Take 1 tablet (500 mg total) by mouth 2 (two) times daily.   omeprazole 20 MG capsule Commonly known as:  PRILOSEC Take 40 mg by mouth 2 (two) times daily.   protein supplement shake Liqd Commonly known as:  PREMIER PROTEIN Take 325 mLs (11 oz total) by mouth 3 (three) times daily between meals.   scopolamine 1 MG/3DAYS Commonly known as:  TRANSDERM-SCOP Place 1 patch onto the skin every 3 (three) days.   sucralfate 1 GM/10ML suspension Commonly known as:  CARAFATE Take 10 mLs (1 g total) by mouth 4 (four)  times daily -  with meals and at bedtime.   valsartan 80 MG tablet Commonly known as:  DIOVAN Take 80 mg by mouth daily.      Follow-up Information    Dorna Leitz, MD Follow up.   Specialty:  Internal Medicine Why:  [phone].  for follow up of swallowing issues Contact information: [address] [place] [address] [phone]          Allergies  Allergen Reactions  . 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 Percocet [Oxycodone-Acetaminophen] Nausea And Vomiting    Tolerates with nausea medication    Consultations:  Gastroenterology   Procedures/Studies: Dg Chest 2 View  Result Date: 01/29/2017 CLINICAL DATA:  Nausea and vomiting over the past 3 days with dizziness. Syncope. EXAM: CHEST  2 VIEW COMPARISON:  Multiple exams, including 10/22/2016 FINDINGS: Numerous old bilateral rib deformities related to healed fractures. Cardiac and mediastinal margins appear normal. The lungs appear clear. IMPRESSION: 1.  No active cardiopulmonary disease is radiographically apparent. 2. Multiple bilateral rib fractures. Electronically Signed   By: Gaylyn Rong M.D.   On: 01/29/2017 21:16   Ct Abdomen Pelvis  W Contrast  Result Date: 01/30/2017 CLINICAL DATA:  Abdominal pain. History of gastric bypass and multiple sclerosis. EXAM: CT ABDOMEN AND PELVIS WITH CONTRAST TECHNIQUE: Multidetector CT imaging of the abdomen and pelvis was performed using the standard protocol following bolus administration of intravenous contrast. CONTRAST:  80mL ISOVUE-300 IOPAMIDOL (ISOVUE-300) INJECTION 61% COMPARISON:  CT abdomen and pelvis October 22, 2016 FINDINGS: LOWER CHEST: Lingular atelectasis/scarring. Heart size is normal. No pericardial effusions HEPATOBILIARY: Interval cholecystectomy. Similar focal fatty infiltration about the falciform ligament, liver is otherwise unremarkable. PANCREAS: Normal. SPLEEN: Normal. ADRENALS/URINARY TRACT: Kidneys  are orthotopic, demonstrating symmetric enhancement. No nephrolithiasis, hydronephrosis or solid renal masses. The unopacified ureters are normal in course and caliber. Delayed imaging through the kidneys demonstrates symmetric prompt contrast excretion within the proximal urinary collecting system. Urinary bladder is decompressed with greater than expected wall thickening and intraluminal gas. Normal adrenal glands. STOMACH/BOWEL: Similar distal esophageal wall thickening, cyst status post gastric sleeve with small hiatal hernia. Moderate sigmoid diverticulosis. VASCULAR/LYMPHATIC: Aortoiliac vessels are normal in course and caliber. No lymphadenopathy by CT size criteria. REPRODUCTIVE: 2.8 cm homogeneously hypodense benign appearing RIGHT ovarian cyst, no indicated follow-up. OTHER: No intraperitoneal free fluid or free air. MUSCULOSKELETAL: Nonacute. Old Rib fracture deformities in the included chest. Old LEFT superior and inferior pubic rami fractures. Surgical clips LEFT inguinal soft tissues most compatible with prior vascular access. Small fat containing paraumbilical hernia. Mild scoliosis. Severe L5-S1 degenerative disc. IMPRESSION: Bladder wall thickening concerning for cystitis with intraluminal gas, recommend correlation with instrumentation. No CT findings of pyelonephritis. Chronic esophageal wall thickening, status post gastric sleeve. Endoscopy again recommended on a nonemergent basis. Electronically Signed   By: Awilda Metro M.D.   On: 01/30/2017 01:44    EGD (02/01/17)  Findings:      One severe benign-appearing, intrinsic stenosis was found at the       gastroesophageal junction. This measured 5 mm (inner diameter) and was       not traversed. A TTS dilator was passed through the scope. Dilation with       a 6-7-8 mm balloon and an 03-28-09 mm balloon dilator was performed to 9       mm. The dilation site was examined and showed moderate improvement in       luminal narrowing.  Estimated blood loss was minimal. Impression:               - Benign-appearing esophageal stenosis. Dilated.                            IOnfallamtory-appearing severe stricture - dilated                            to 9 mm. Cause not clear presune reflux but why a                            problem after years of doing ok after surgery - Up                            to Date says can be early or late complication and                            if refractory to medical Tx conversion to lap  roux-en-y is answer. I cannot say she has failed                            medical Tx as was on capsules of omeprazole and she                            herself started opening them into applesauce when                            dysphagia worsened.                           - No specimens collected.   EGD (02/04/17)  Findings:      LA Grade C (one or more mucosal breaks continuous between tops of 2 or       more mucosal folds, less than 75% circumference) esophagitis with no       bleeding was found in the distal esophagus.      One severe benign-appearing, intrinsic stenosis was found at the       gastroesophageal junction. This measured 7 mm (inner diameter) and was       not traversed. A TTS dilator was passed through the scope. Dilation with       a 05-31-11 mm balloon dilator was performed to 12 mm. Small amount of       heme noted. The stricture measured about 9 mm post dilation however the       adult endoscope would not easily pass.      Pills were found in the distal esophagus. Removal was accomplished with       a Roth net.      The exam of the esophagus was otherwise normal. Impression:               - LA Grade C reflux esophagitis.                           - Benign-appearing esophageal stenosis. Dilated.                           - Pills were found in the esophagus. Removal was                            successful.   Subjective: Patient reports no nausea  or vomiting with her meals.  Discharge Exam: Vitals:   02/04/17 1131 02/04/17 1704  BP: (!) 155/85 136/83  Pulse: (!) 58 65  Resp: 16 17  Temp: 97.6 F (36.4 C) 98.7 F (37.1 C)   Vitals:   02/04/17 1040 02/04/17 1050 02/04/17 1131 02/04/17 1704  BP: (!) 162/92 (!) 157/81 (!) 155/85 136/83  Pulse: 67 (!) 59 (!) 58 65  Resp: 16 13 16 17   Temp:   97.6 F (36.4 C) 98.7 F (37.1 C)  TempSrc:   Oral Oral  SpO2: 96% 97% 95% 95%  Weight:      Height:        General: Pt is alert, awake, not in acute distress Cardiovascular: RRR, S1/S2 +, no rubs, no gallops Respiratory: CTA bilaterally, no wheezing, no rhonchi Abdominal: Soft, NT, ND, bowel sounds + Extremities: no edema, no  cyanosis    The results of significant diagnostics from this hospitalization (including imaging, microbiology, ancillary and laboratory) are listed below for reference.     Microbiology: Recent Results (from the past 240 hour(s))  Urine culture     Status: Abnormal   Collection Time: 01/30/17  5:41 AM  Result Value Ref Range Status   Specimen Description URINE, RANDOM  Final   Special Requests NONE  Final   Culture 80,000 COLONIES/mL ESCHERICHIA COLI (A)  Final   Report Status 02/01/2017 FINAL  Final   Organism ID, Bacteria ESCHERICHIA COLI (A)  Final      Susceptibility   Escherichia coli - MIC*    AMPICILLIN 4 SENSITIVE Sensitive     CEFAZOLIN <=4 SENSITIVE Sensitive     CEFTRIAXONE <=1 SENSITIVE Sensitive     CIPROFLOXACIN <=0.25 SENSITIVE Sensitive     GENTAMICIN <=1 SENSITIVE Sensitive     IMIPENEM <=0.25 SENSITIVE Sensitive     NITROFURANTOIN <=16 SENSITIVE Sensitive     TRIMETH/SULFA <=20 SENSITIVE Sensitive     AMPICILLIN/SULBACTAM <=2 SENSITIVE Sensitive     PIP/TAZO <=4 SENSITIVE Sensitive     Extended ESBL NEGATIVE Sensitive     * 80,000 COLONIES/mL ESCHERICHIA COLI     Labs: BNP (last 3 results) No results for input(s): BNP in the last 8760 hours. Basic Metabolic  Panel:  Recent Labs Lab 01/30/17 1123 01/31/17 0243 02/01/17 0430 02/01/17 1039 02/02/17 0437  NA 136 139 140  --  139  K 3.2* 3.5 2.8*  --  3.5  CL 100* 104 104  --  107  CO2 27 27 28   --  28  GLUCOSE 63* 75 82  --  86  BUN 20 15 6   --  <5*  CREATININE 1.11* 0.85 0.74  --  0.77  CALCIUM 8.7* 8.7* 8.6*  --  8.1*  MG  --   --   --  1.3*  --    Liver Function Tests: No results for input(s): AST, ALT, ALKPHOS, BILITOT, PROT, ALBUMIN in the last 168 hours. No results for input(s): LIPASE, AMYLASE in the last 168 hours. No results for input(s): AMMONIA in the last 168 hours. CBC: No results for input(s): WBC, NEUTROABS, HGB, HCT, MCV, PLT in the last 168 hours. Cardiac Enzymes: No results for input(s): CKTOTAL, CKMB, CKMBINDEX, TROPONINI in the last 168 hours. BNP: Invalid input(s): POCBNP CBG: No results for input(s): GLUCAP in the last 168 hours. D-Dimer No results for input(s): DDIMER in the last 72 hours. Hgb A1c No results for input(s): HGBA1C in the last 72 hours. Lipid Profile No results for input(s): CHOL, HDL, LDLCALC, TRIG, CHOLHDL, LDLDIRECT in the last 72 hours. Thyroid function studies No results for input(s): TSH, T4TOTAL, T3FREE, THYROIDAB in the last 72 hours.  Invalid input(s): FREET3 Anemia work up No results for input(s): VITAMINB12, FOLATE, FERRITIN, TIBC, IRON, RETICCTPCT in the last 72 hours. Urinalysis    Component Value Date/Time   COLORURINE AMBER (A) 01/30/2017 0017   APPEARANCEUR CLOUDY (A) 01/30/2017 0017   LABSPEC 1.018 01/30/2017 0017   PHURINE 5.0 01/30/2017 0017   GLUCOSEU NEGATIVE 01/30/2017 0017   HGBUR NEGATIVE 01/30/2017 0017   BILIRUBINUR SMALL (A) 01/30/2017 0017   KETONESUR 5 (A) 01/30/2017 0017   PROTEINUR 100 (A) 01/30/2017 0017   NITRITE NEGATIVE 01/30/2017 0017   LEUKOCYTESUR MODERATE (A) 01/30/2017 0017   Sepsis Labs Invalid input(s): PROCALCITONIN,  WBC,  LACTICIDVEN Microbiology Recent Results (from the past 240  hour(s))  Urine culture  Status: Abnormal   Collection Time: 01/30/17  5:41 AM  Result Value Ref Range Status   Specimen Description URINE, RANDOM  Final   Special Requests NONE  Final   Culture 80,000 COLONIES/mL ESCHERICHIA COLI (A)  Final   Report Status 02/01/2017 FINAL  Final   Organism ID, Bacteria ESCHERICHIA COLI (A)  Final      Susceptibility   Escherichia coli - MIC*    AMPICILLIN 4 SENSITIVE Sensitive     CEFAZOLIN <=4 SENSITIVE Sensitive     CEFTRIAXONE <=1 SENSITIVE Sensitive     CIPROFLOXACIN <=0.25 SENSITIVE Sensitive     GENTAMICIN <=1 SENSITIVE Sensitive     IMIPENEM <=0.25 SENSITIVE Sensitive     NITROFURANTOIN <=16 SENSITIVE Sensitive     TRIMETH/SULFA <=20 SENSITIVE Sensitive     AMPICILLIN/SULBACTAM <=2 SENSITIVE Sensitive     PIP/TAZO <=4 SENSITIVE Sensitive     Extended ESBL NEGATIVE Sensitive     * 80,000 COLONIES/mL ESCHERICHIA COLI     Time coordinating discharge: Over 30 minutes  SIGNED:   Jacquelin Hawking, MD [facility] 02/06/2017, 9:18 AM Pager [phone]  If 7PM-7AM, please contact night-coverage [email or web address] Password [email or web address]

## 2017-02-06 ENCOUNTER — Encounter (HOSPITAL_COMMUNITY): Payer: Self-pay | Admitting: Gastroenterology

## 2017-02-11 NOTE — Anesthesia Postprocedure Evaluation (Signed)
Anesthesia Post Note  Patient: Cresenciano Lick Gramlich  Procedure(s) Performed: Procedure(s) (LRB): ESOPHAGOGASTRODUODENOSCOPY (EGD) WITH PROPOFOL (N/A)     Patient location during evaluation: PACU Anesthesia Type: MAC Level of consciousness: awake and alert Pain management: pain level controlled Vital Signs Assessment: post-procedure vital signs reviewed and stable Respiratory status: spontaneous breathing, nonlabored ventilation, respiratory function stable and patient connected to nasal cannula oxygen Cardiovascular status: stable and blood pressure returned to baseline Anesthetic complications: no    Last Vitals:  Vitals:   02/04/17 1131 02/04/17 1704  BP: (!) 155/85 136/83  Pulse: (!) 58 65  Resp: 16 17  Temp: 36.4 C 37.1 C    Last Pain:  Vitals:   02/04/17 1704  TempSrc: Oral  PainSc:                  Riccardo Dubin

## 2017-02-22 ENCOUNTER — Other Ambulatory Visit: Payer: Self-pay | Admitting: Neurology

## 2017-02-22 ENCOUNTER — Telehealth: Payer: Self-pay | Admitting: Neurology

## 2017-02-22 NOTE — Telephone Encounter (Signed)
LMTC./fim 

## 2017-02-22 NOTE — Telephone Encounter (Signed)
Patient called and requested a follow up apt. I offered her the first available but she stated that she would really like to be seen before then. She has recently been in the hospitalized and has changed some of her meds so she wants to discuss this with him. Please call and advise.

## 2017-02-26 DIAGNOSIS — R350 Frequency of micturition: Secondary | ICD-10-CM | POA: Diagnosis not present

## 2017-02-26 DIAGNOSIS — R300 Dysuria: Secondary | ICD-10-CM | POA: Diagnosis not present

## 2017-02-26 DIAGNOSIS — G35 Multiple sclerosis: Secondary | ICD-10-CM | POA: Diagnosis not present

## 2017-02-26 NOTE — Telephone Encounter (Signed)
I have spoken with  Catherine Hubbard this afternoon.  She sts. she was recently hospitalized, meds were changed (not MS dmt).  She is confused and would like to see RAS to discuss.  Appt. given tomorrow am/fim

## 2017-02-27 ENCOUNTER — Ambulatory Visit: Payer: Self-pay | Admitting: Neurology

## 2017-02-28 ENCOUNTER — Encounter: Payer: Self-pay | Admitting: Neurology

## 2017-02-28 ENCOUNTER — Other Ambulatory Visit: Payer: Self-pay

## 2017-02-28 ENCOUNTER — Emergency Department (HOSPITAL_COMMUNITY): Payer: Federal, State, Local not specified - PPO

## 2017-02-28 ENCOUNTER — Encounter (HOSPITAL_COMMUNITY): Payer: Self-pay

## 2017-02-28 ENCOUNTER — Emergency Department (HOSPITAL_COMMUNITY)
Admission: EM | Admit: 2017-02-28 | Discharge: 2017-03-01 | Disposition: A | Discharge status: Home / Self Care | Payer: Federal, State, Local not specified - PPO | Attending: Emergency Medicine | Admitting: Emergency Medicine

## 2017-02-28 DIAGNOSIS — S7001XA Contusion of right hip, initial encounter: Secondary | ICD-10-CM | POA: Insufficient documentation

## 2017-02-28 DIAGNOSIS — W19XXXA Unspecified fall, initial encounter: Secondary | ICD-10-CM | POA: Insufficient documentation

## 2017-02-28 DIAGNOSIS — R202 Paresthesia of skin: Secondary | ICD-10-CM | POA: Insufficient documentation

## 2017-02-28 DIAGNOSIS — Y93H2 Activity, gardening and landscaping: Secondary | ICD-10-CM | POA: Insufficient documentation

## 2017-02-28 DIAGNOSIS — N390 Urinary tract infection, site not specified: Secondary | ICD-10-CM | POA: Diagnosis not present

## 2017-02-28 DIAGNOSIS — S20211A Contusion of right front wall of thorax, initial encounter: Secondary | ICD-10-CM | POA: Insufficient documentation

## 2017-02-28 DIAGNOSIS — W1839XA Other fall on same level, initial encounter: Secondary | ICD-10-CM | POA: Diagnosis not present

## 2017-02-28 DIAGNOSIS — I10 Essential (primary) hypertension: Secondary | ICD-10-CM | POA: Insufficient documentation

## 2017-02-28 DIAGNOSIS — R079 Chest pain, unspecified: Secondary | ICD-10-CM | POA: Diagnosis not present

## 2017-02-28 DIAGNOSIS — Y998 Other external cause status: Secondary | ICD-10-CM | POA: Insufficient documentation

## 2017-02-28 DIAGNOSIS — S79911A Unspecified injury of right hip, initial encounter: Secondary | ICD-10-CM | POA: Diagnosis not present

## 2017-02-28 DIAGNOSIS — Z8739 Personal history of other diseases of the musculoskeletal system and connective tissue: Secondary | ICD-10-CM | POA: Diagnosis not present

## 2017-02-28 DIAGNOSIS — R0789 Other chest pain: Secondary | ICD-10-CM | POA: Diagnosis present

## 2017-02-28 DIAGNOSIS — S0990XA Unspecified injury of head, initial encounter: Secondary | ICD-10-CM | POA: Diagnosis not present

## 2017-02-28 DIAGNOSIS — J45909 Unspecified asthma, uncomplicated: Secondary | ICD-10-CM | POA: Diagnosis not present

## 2017-02-28 DIAGNOSIS — Y92007 Garden or yard of unspecified non-institutional (private) residence as the place of occurrence of the external cause: Secondary | ICD-10-CM | POA: Insufficient documentation

## 2017-02-28 DIAGNOSIS — Z87891 Personal history of nicotine dependence: Secondary | ICD-10-CM | POA: Insufficient documentation

## 2017-02-28 DIAGNOSIS — S299XXA Unspecified injury of thorax, initial encounter: Secondary | ICD-10-CM | POA: Diagnosis not present

## 2017-02-28 DIAGNOSIS — S199XXA Unspecified injury of neck, initial encounter: Secondary | ICD-10-CM | POA: Diagnosis not present

## 2017-02-28 LAB — BASIC METABOLIC PANEL
ANION GAP: 11 (ref 5–15)
BUN: 8 mg/dL (ref 6–20)
CALCIUM: 9.2 mg/dL (ref 8.9–10.3)
CO2: 25 mmol/L (ref 22–32)
CREATININE: 0.5 mg/dL (ref 0.44–1.00)
Chloride: 97 mmol/L — ABNORMAL LOW (ref 101–111)
Glucose, Bld: 99 mg/dL (ref 65–99)
Potassium: 3.5 mmol/L (ref 3.5–5.1)
SODIUM: 133 mmol/L — AB (ref 135–145)

## 2017-02-28 LAB — CBC
HCT: 37.5 % (ref 36.0–46.0)
Hemoglobin: 12.8 g/dL (ref 12.0–15.0)
MCH: 31.9 pg (ref 26.0–34.0)
MCHC: 34.1 g/dL (ref 30.0–36.0)
MCV: 93.5 fL (ref 78.0–100.0)
PLATELETS: 222 10*3/uL (ref 150–400)
RBC: 4.01 MIL/uL (ref 3.87–5.11)
RDW: 13.2 % (ref 11.5–15.5)
WBC: 5.3 10*3/uL (ref 4.0–10.5)

## 2017-02-28 LAB — URINALYSIS, ROUTINE W REFLEX MICROSCOPIC
BILIRUBIN URINE: NEGATIVE
Glucose, UA: NEGATIVE mg/dL
Ketones, ur: NEGATIVE mg/dL
Nitrite: POSITIVE — AB
PROTEIN: NEGATIVE mg/dL
SQUAMOUS EPITHELIAL / LPF: NONE SEEN
Specific Gravity, Urine: 1.004 — ABNORMAL LOW (ref 1.005–1.030)
pH: 5 (ref 5.0–8.0)

## 2017-02-28 LAB — I-STAT TROPONIN, ED: TROPONIN I, POC: 0.02 ng/mL (ref 0.00–0.08)

## 2017-02-28 MED ORDER — DEXTROSE 5 % IV SOLN
1.0000 g | Freq: Once | INTRAVENOUS | Status: AC
  Administered 2017-03-01: 1 g via INTRAVENOUS
  Filled 2017-02-28: qty 10

## 2017-02-28 MED ORDER — HYDROMORPHONE HCL 1 MG/ML IJ SOLN
1.0000 mg | Freq: Once | INTRAMUSCULAR | Status: AC
  Administered 2017-02-28: 1 mg via INTRAMUSCULAR
  Filled 2017-02-28: qty 1

## 2017-02-28 NOTE — ED Notes (Signed)
Patient feels two front teeth are loose from fall

## 2017-02-28 NOTE — ED Triage Notes (Signed)
PerPt, Pt is coming from home with complaints of fall earlier today. Pt reports that she was gardening and fell over some material that she didn't see. Pt hit mouth and chest. Unsure if she hit her head. Complains of right chest pain and numbness to the right face with right arm numbness. Pt reports being told one pupil is larger than the other many years ago, but hasn't been told since then.

## 2017-02-28 NOTE — ED Provider Notes (Signed)
[facility]-EMERGENCY DEPT Provider Note   CSN: [id number] Arrival date & time: 02/28/17  1827     History   Chief Complaint Chief Complaint  Patient presents with  . Fall    HPI Catherine Hubbard is a 56 y.o. female with a hx of MS presents to the Emergency Department complaining of gradual, persistent, progressively worsening central and right sided chest pain worsened with movement, palpation and deep inspiration onset around noon today after a mechanical fall.  Pt reports she was out watering her garden when she stumbled and fell.  Unknown LOC.  Pt reports she awoke on the ground.  She was able to get up and ambulate home, but has had increasing pain since that time.  Pt also reports paresthesias of the right face and bilateral hands.  She states her incisors feel "loose."  No treatments PTA.  Nothing seems to make her symptoms better.  She reports hx of multiple broken ribs.  Pt denies SOB, fever, chills, cough, abd pain, N/V/D.    Pt also c/o foul smelling and dark urine.  She has had some increased frequency and reports this feels similar to previous bladder infections. Pt reports seeing her PCP yesterday who called in an abx tonight which she has not yet filled.  She denies back pain, flank pain.     The history is provided by the patient and medical records. No language interpreter was used.    Past Medical History:  Diagnosis Date  . Asthma   . Esophagitis   . Headache   . Hearing loss   . Hypertension   . Multiple sclerosis (HCC)   . Vision abnormalities     Patient Active Problem List   Diagnosis Date Noted  . Foreign body in esophagus   . Esophageal stricture   . Intractable nausea and vomiting 01/31/2017  . Dysphagia   . Severe protein-calorie malnutrition (HCC)   . Nausea & vomiting 01/30/2017  . Hypokalemia 01/30/2017  . Acute kidney injury (HCC) 01/30/2017  . Cystitis 01/30/2017  . Costochondritis 05/11/2016  . Trochanteric bursitis of both hips 03/28/2016    . Upper back pain 03/28/2016  . Bilateral low back pain with bilateral sciatica 07/13/2015  . Right hip pain 07/13/2015  . Lumbar radicular pain 03/21/2015  . Other fatigue 01/11/2015  . Dysesthesia 01/11/2015  . Urinary urgency 01/11/2015  . Multiple sclerosis (HCC) 10/06/2014  . Numbness 10/06/2014  . Ataxic gait 10/06/2014  . High risk medication use 10/06/2014  . Gastric bypass status for obesity 10/06/2014  . Cognitive changes 10/06/2014  . Depression with anxiety 10/06/2014  . Restless leg 10/06/2014  . Insomnia 10/06/2014  . Essential hypertension 10/06/2014  . Difficulty hearing 01/13/2014  . Other muscle spasm 01/13/2014    Past Surgical History:  Procedure Laterality Date  . ANKLE FRACTURE SURGERY Bilateral   . CARPAL TUNNEL RELEASE Bilateral   . COLONOSCOPY  11/2016   multiple  . ESOPHAGOGASTRODUODENOSCOPY  11/2016  . ESOPHAGOGASTRODUODENOSCOPY N/A 02/01/2017   Procedure: ESOPHAGOGASTRODUODENOSCOPY (EGD);  Surgeon: Iva Boop, MD;  Location[facility] ENDOSCOPY;  Service: Endoscopy;  Laterality: N/A;  . ESOPHAGOGASTRODUODENOSCOPY (EGD) WITH PROPOFOL N/A 02/04/2017   Procedure: ESOPHAGOGASTRODUODENOSCOPY (EGD) WITH PROPOFOL;  Surgeon: Meryl Dare, MD;  Location: [facility] ENDOSCOPY;  Service: Endoscopy;  Laterality: N/A;  . LAPAROSCOPIC GASTRIC SLEEVE RESECTION  2011  . ULNAR NERVE REPAIR Bilateral     OB History    No data available       Home Medications  Prior to Admission medications   Medication Sig Start Date End Date Taking? Authorizing Provider  albuterol (PROVENTIL HFA;VENTOLIN HFA) 108 (90 Base) MCG/ACT inhaler Inhale 2 puffs into the lungs every 6 (six) hours as needed for wheezing or shortness of breath.   Yes [provider]  buPROPion (WELLBUTRIN) 100 MG tablet Take 1 tablet (100 mg total) by mouth 3 (three) times daily. Crush pills 02/04/17 02/04/18 Yes Narda Bonds, MD  diazepam (VALIUM) 5 MG tablet TAKE 1 TABLET BY MOUTH EVERY 12  HOURS AS NEEDED FOR MUSCLE SPASMS 02/26/17  Yes Sater, Pearletha Furl, MD  FLUoxetine (PROZAC) 20 MG tablet Take 1 tablet (20 mg total) by mouth daily. 02/04/17  Yes Narda Bonds, MD  Interferon Beta-1a (REBIF REBIDOSE) 44 MCG/0.5ML SOAJ Inject 44 mcg into the skin 3 (three) times a week. 09/10/16  Yes Sater, Pearletha Furl, MD  nabumetone (RELAFEN) 500 MG tablet Take 1 tablet (500 mg total) by mouth 2 (two) times daily. 05/11/16  Yes Sater, Pearletha Furl, MD  omeprazole (PRILOSEC) 20 MG capsule Take 40 mg by mouth 2 (two) times daily.   Yes [provider]  protein supplement shake (PREMIER PROTEIN) LIQD Take 325 mLs (11 oz total) by mouth 3 (three) times daily between meals. 02/04/17  Yes Narda Bonds, MD  scopolamine (TRANSDERM-SCOP) 1 MG/3DAYS Place 1 patch onto the skin every 3 (three) days.   Yes [provider]  sucralfate (CARAFATE) 1 GM/10ML suspension Take 10 mLs (1 g total) by mouth 4 (four) times daily -  with meals and at bedtime. 02/04/17  Yes Narda Bonds, MD  valsartan (DIOVAN) 80 MG tablet Take 80 mg by mouth daily.   Yes [provider]  cephALEXin (KEFLEX) 500 MG capsule Take 1 capsule (500 mg total) by mouth 4 (four) times daily. 03/01/17   Carsen Leaf, Dahlia Client, PA-C  HYDROcodone-acetaminophen (NORCO/VICODIN) 5-325 MG tablet Take 1-2 tablets by mouth every 6 (six) hours as needed for severe pain. 03/01/17   Carnetta Losada, Dahlia Client, PA-C    Family History Family History  Problem Relation Age of Onset  . Cancer Mother   . Stroke Mother   . Cancer Father     Social History Social History  Substance Use Topics  . Smoking status: Former Games developer  . Smokeless tobacco: Never Used  . Alcohol use 0.0 oz/week     Comment: Sts. she drinks beer, liquor daily, varying amts/fim     Allergies   Ibuprofen and Percocet [oxycodone-acetaminophen]   Review of Systems Review of Systems  Constitutional: Negative for chills and fever.  HENT: Positive for dental problem  (tooth pain). Negative for facial swelling.   Eyes: Negative for redness.  Respiratory: Negative for shortness of breath.   Cardiovascular: Positive for chest pain.  Gastrointestinal: Negative for abdominal pain, nausea and vomiting.  Endocrine: Negative for polydipsia, polyphagia and polyuria.  Genitourinary: Negative for hematuria.  Musculoskeletal: Positive for arthralgias (left knee and elbow) and back pain.  Skin: Negative for wound.  Neurological: Negative for facial asymmetry, weakness and light-headedness.  Hematological: Negative for adenopathy.  Psychiatric/Behavioral: The patient is not nervous/anxious.   All other systems reviewed and are negative.    Physical Exam Updated Vital Signs BP (!) 156/98 (BP Location: Right Arm)   Pulse 93   Temp 98.1 F (36.7 C) (Oral)   Resp 16   Ht 5\' 5"  (1.651 m)   Wt 61.2 kg (135 lb)   SpO2 99%   BMI 22.47 kg/m  Physical Exam  Constitutional: She is oriented to person, place, and time. She appears well-developed and well-nourished. No distress.  Awake, alert, nontoxic appearance  HENT:  Head: Normocephalic and atraumatic.  Mouth/Throat: Oropharynx is clear and moist. Abnormal dentition (incisors and tooth #10 are loose). No oropharyngeal exudate.  No intraoral lacerations No trismus or malocclusion  Eyes: Pupils are equal, round, and reactive to light. Conjunctivae and EOM are normal. No scleral icterus.  No horizontal, vertical or rotational nystagmus  Neck: Normal range of motion. Neck supple.  Full active and passive ROM without pain No midline or paraspinal tenderness No nuchal rigidity or meningeal signs  Cardiovascular: Normal rate, regular rhythm and intact distal pulses.   Pulmonary/Chest: Effort normal and breath sounds normal. No respiratory distress. She has no wheezes. She has no rales. She exhibits tenderness.    Equal chest expansion Significant tenderness along the right chest without palpable deformity;  contusion noted to that site  Abdominal: Soft. Bowel sounds are normal. She exhibits no mass. There is no tenderness. There is no rebound and no guarding.  Musculoskeletal: Normal range of motion. She exhibits no edema.  FROM of the bialateral hips, but large contusion to the right trochanter with significant TTP. Small abrasions to the bilateral elbows, but FROM of bilateral elbows without pain  Lymphadenopathy:    She has no cervical adenopathy.  Neurological: She is alert and oriented to person, place, and time. No cranial nerve deficit. She exhibits normal muscle tone. Coordination normal.  Mental Status:  Alert, oriented, thought content appropriate. Speech fluent without evidence of aphasia. Able to follow 2 step commands without difficulty.  Cranial Nerves:  II:  Left pupil greater than right (pt reports baseline anisocoria), pupils reactive to light III,IV, VI: ptosis not present, extra-ocular motions intact bilaterally  V,VII: smile symmetric, facial light touch sensation equal VIII: hearing grossly normal bilaterally  IX,X: midline uvula rise  XI: bilateral shoulder shrug equal and strong XII: midline tongue extension  Motor:  5/5 in upper and lower extremities bilaterally including strong and equal grip strength and dorsiflexion/plantar flexion Sensory: light touch normal in all extremities.  Gait: normal gait and balance CV: distal pulses palpable throughout   Skin: Skin is warm and dry. No rash noted. She is not diaphoretic.  Psychiatric: She has a normal mood and affect. Her behavior is normal. Judgment and thought content normal.  Nursing note and vitals reviewed.    ED Treatments / Results  Labs (all labs ordered are listed, but only abnormal results are displayed) Labs Reviewed  BASIC METABOLIC PANEL - Abnormal; Notable for the following:       Result Value   Sodium 133 (*)    Chloride 97 (*)    All other components within normal limits  URINALYSIS, ROUTINE W  REFLEX MICROSCOPIC - Abnormal; Notable for the following:    APPearance HAZY (*)    Specific Gravity, Urine 1.004 (*)    Hgb urine dipstick SMALL (*)    Nitrite POSITIVE (*)    Leukocytes, UA LARGE (*)    Bacteria, UA MANY (*)    All other components within normal limits  URINE CULTURE  CBC  I-STAT TROPOININ, ED    EKG  EKG Interpretation  Date/Time:  Thursday February 28 2017 21:47:10 EDT Ventricular Rate:  86 PR Interval:    QRS Duration: 102 QT Interval:  409 QTC Calculation: 490 R Axis:   117 Text Interpretation:  Sinus rhythm Ventricular premature complex Right axis deviation Abnrm  T, consider ischemia, anterolateral lds since previous tracing, rate has slowed, occasional PVC Confirmed by Frederick Peers [phone]) on 02/28/2017 11:16:14 PM        Radiology Dg Chest 2 View  Result Date: 02/28/2017 CLINICAL DATA:  Fall today.  Severe right-sided chest pain. EXAM: CHEST  2 VIEW COMPARISON:  01/29/2017 FINDINGS: The heart size and mediastinal contours are within normal limits. Both lungs are clear. No evidence of pneumothorax or hemothorax. Stable mild hyperinflation. Old bilateral rib fracture deformities are again seen. IMPRESSION: No active cardiopulmonary disease. Electronically Signed   By: Myles Rosenthal M.D.   On: 02/28/2017 19:44   Ct Head Wo Contrast  Result Date: 02/28/2017 CLINICAL DATA:  Fall during gardening today. Head injury. Initial encounter. EXAM: CT HEAD WITHOUT CONTRAST TECHNIQUE: Contiguous axial images were obtained from the base of the skull through the vertex without intravenous contrast. COMPARISON:  04/25/2015 FINDINGS: Brain: No evidence of acute infarction, hemorrhage, hydrocephalus, extra-axial collection, or mass lesion/mass effect. Stable appearance of mild diffuse cerebral atrophy and moderate chronic small vessel disease. Vascular:  No hyperdense vessel or other acute findings. Skull: No evidence of fracture or other significant bone abnormality.  Sinuses/Orbits:  No acute findings. Other: None. IMPRESSION: No acute intracranial abnormality. Stable cerebral atrophy and chronic small vessel disease. Electronically Signed   By: Myles Rosenthal M.D.   On: 02/28/2017 20:00   Ct Cervical Spine Wo Contrast  Result Date: 02/28/2017 CLINICAL DATA:  Fall EXAM: CT CERVICAL SPINE WITHOUT CONTRAST TECHNIQUE: Multidetector CT imaging of the cervical spine was performed without intravenous contrast. Multiplanar CT image reconstructions were also generated. COMPARISON:  CT brain 02/28/2017 FINDINGS: Alignment: No subluxation.  Facet alignment within normal limits. Skull base and vertebrae: No acute fracture. No primary bone lesion or focal pathologic process. Soft tissues and spinal canal: No prevertebral fluid or swelling. No visible canal hematoma. Disc levels: Moderate degenerative disc changes at C5-C6, moderate severe changes at C6-C7 and C7-T1. Upper chest: Lung apices clear. Small hypodense nodules in the thyroid gland Other: None IMPRESSION: Degenerative changes.  No definite acute osseous abnormality. Electronically Signed   By: Jasmine Pang M.D.   On: 02/28/2017 23:49   Dg Hip Unilat W Or Wo Pelvis 2-3 Views Right  Result Date: 02/28/2017 CLINICAL DATA:  56 year old female with fall and right hip hematoma. EXAM: DG HIP (WITH OR WITHOUT PELVIS) 2-3V RIGHT COMPARISON:  None. FINDINGS: There is no acute fracture or dislocation. The bones are mildly osteopenic. Mild arthritic changes of the hips. Surgical clips noted in the left groin. The soft tissues appear unremarkable. IMPRESSION: No fracture or dislocation. Electronically Signed   By: Elgie Collard M.D.   On: 02/28/2017 23:53    Procedures Procedures (including critical care time)  Medications Ordered in ED Medications  HYDROmorphone (DILAUDID) injection 1 mg (1 mg Intramuscular Given 02/28/17 2316)  cefTRIAXone (ROCEPHIN) 1 g in dextrose 5 % 50 mL IVPB (1 g Intravenous New Bag/Given 03/01/17 0005)      Initial Impression / Assessment and Plan / ED Course  I have reviewed the triage vital signs and the nursing notes.  Pertinent labs & imaging results that were available during my care of the patient were reviewed by me and considered in my medical decision making (see chart for details).     Patient presents with a mechanical fall. She does complain of some paresthesias to the upper extremities however has a normal neurologic exam. Patient with complaints of urinary tract symptoms. UA consistent with  same. CT neck and head are without acute abnormalities. Plain films are reassuring. Suspect paresthesias are secondary to MS in the setting of infection. With normal neurologic exam will hold on steroids at this time however request patient to see her neurologist and 3-5 days or return to emergency department for worsening symptoms. She states understanding and is in agreement with the plan. Urine culture pending. Patient has received Rocephin here in the emergency department.   The patient was discussed with and seen by Dr. Clarene Duke who agrees with the treatment plan.  Final Clinical Impressions(s) / ED Diagnoses   Final diagnoses:  Fall, initial encounter  Contusion of right chest wall, initial encounter  Contusion of right hip, initial encounter  Urinary tract infection without hematuria, site unspecified    New Prescriptions New Prescriptions   CEPHALEXIN (KEFLEX) 500 MG CAPSULE    Take 1 capsule (500 mg total) by mouth 4 (four) times daily.   HYDROCODONE-ACETAMINOPHEN (NORCO/VICODIN) 5-325 MG TABLET    Take 1-2 tablets by mouth every 6 (six) hours as needed for severe pain.     Morrell Fluke, Boyd Kerbs 03/01/17 0207    Little, Ambrose Finland, MD 03/01/17 [id number]

## 2017-03-01 MED ORDER — HYDROCODONE-ACETAMINOPHEN 5-325 MG PO TABS: 1.0000 | ORAL_TABLET | Freq: Four times a day (QID) | ORAL | 0 refills | Status: DC | PRN

## 2017-03-01 MED ORDER — CEPHALEXIN 500 MG PO CAPS: 500.0000 mg | ORAL_CAPSULE | Freq: Four times a day (QID) | ORAL | 0 refills | Status: DC

## 2017-03-01 NOTE — ED Notes (Signed)
Patient able to ambulate independently  

## 2017-03-01 NOTE — Discharge Instructions (Signed)
1. Medications: keflex, vicodin for severe pain, usual home medications 2. Treatment: rest, drink plenty of fluids, incentive spirometry 3. Follow Up: Please followup with your primary doctor and/or neurology in 3-5 days for discussion of your diagnoses and further evaluation after today's visit; if you do not have a primary care doctor use the resource guide provided to find one; Please return to the ER as needed if you develop shortness of breath, fever, worsening pain or other concerns

## 2017-03-03 LAB — URINE CULTURE: Culture: >=100000 — AB

## 2017-03-04 ENCOUNTER — Telehealth: Payer: Self-pay | Admitting: *Deleted

## 2017-03-04 NOTE — Telephone Encounter (Signed)
Post ED Visit - Positive Culture Follow-up  Culture report reviewed by antimicrobial stewardship pharmacist:  []  Enzo Bi, Pharm.D. []  Celedonio Miyamoto, Pharm.D., BCPS AQ-ID []  Garvin Fila, Pharm.D., BCPS [x]  Georgina Pillion, Pharm.D., BCPS []  [place], [place].D., BCPS, AAHIVP []  Estella Husk, Pharm.D., BCPS, AAHIVP []  Lysle Pearl, PharmD, BCPS []  Casilda Carls, PharmD, BCPS []  Pollyann Samples, PharmD, BCPS  Positive urine culture Treated with Cephalexin, organism sensitive to the same and no further patient follow-up is required at this time.  Virl Axe [facility] 03/04/2017, 9:53 AM

## 2017-03-11 DIAGNOSIS — K222 Esophageal obstruction: Secondary | ICD-10-CM | POA: Diagnosis not present

## 2017-03-13 DIAGNOSIS — K222 Esophageal obstruction: Secondary | ICD-10-CM | POA: Diagnosis not present

## 2017-03-20 DIAGNOSIS — M9905 Segmental and somatic dysfunction of pelvic region: Secondary | ICD-10-CM | POA: Diagnosis not present

## 2017-03-20 DIAGNOSIS — M5414 Radiculopathy, thoracic region: Secondary | ICD-10-CM | POA: Diagnosis not present

## 2017-03-20 DIAGNOSIS — M9902 Segmental and somatic dysfunction of thoracic region: Secondary | ICD-10-CM | POA: Diagnosis not present

## 2017-03-20 DIAGNOSIS — M9903 Segmental and somatic dysfunction of lumbar region: Secondary | ICD-10-CM | POA: Diagnosis not present

## 2017-03-21 ENCOUNTER — Ambulatory Visit (INDEPENDENT_AMBULATORY_CARE_PROVIDER_SITE_OTHER): Payer: Federal, State, Local not specified - PPO | Admitting: Neurology

## 2017-03-21 ENCOUNTER — Other Ambulatory Visit: Payer: Self-pay | Admitting: Neurology

## 2017-03-21 ENCOUNTER — Encounter: Payer: Self-pay | Admitting: Neurology

## 2017-03-21 VITALS — BP 141/85 | HR 68 | Resp 16 | Ht 65.0 in | Wt 142.0 lb

## 2017-03-21 DIAGNOSIS — G35 Multiple sclerosis: Secondary | ICD-10-CM

## 2017-03-21 DIAGNOSIS — R3915 Urgency of urination: Secondary | ICD-10-CM | POA: Diagnosis not present

## 2017-03-21 DIAGNOSIS — R1310 Dysphagia, unspecified: Secondary | ICD-10-CM

## 2017-03-21 DIAGNOSIS — Z79899 Other long term (current) drug therapy: Secondary | ICD-10-CM

## 2017-03-21 DIAGNOSIS — R260 Ataxic gait: Secondary | ICD-10-CM

## 2017-03-21 DIAGNOSIS — R1319 Other dysphagia: Secondary | ICD-10-CM

## 2017-03-21 DIAGNOSIS — N3090 Cystitis, unspecified without hematuria: Secondary | ICD-10-CM

## 2017-03-21 DIAGNOSIS — F418 Other specified anxiety disorders: Secondary | ICD-10-CM

## 2017-03-21 DIAGNOSIS — R200 Anesthesia of skin: Secondary | ICD-10-CM | POA: Diagnosis not present

## 2017-03-21 DIAGNOSIS — R208 Other disturbances of skin sensation: Secondary | ICD-10-CM | POA: Diagnosis not present

## 2017-03-21 DIAGNOSIS — K222 Esophageal obstruction: Secondary | ICD-10-CM

## 2017-03-21 MED ORDER — VENLAFAXINE HCL ER 75 MG PO CP24: 75.0000 mg | ORAL_CAPSULE | Freq: Every day | ORAL | 11 refills | Status: DC

## 2017-03-21 NOTE — Progress Notes (Signed)
[facility]  PATIENT: Catherine Hubbard DOB: [birth year]   _________________________________   HISTORICAL  CHIEF COMPLAINT:  Chief Complaint  Patient presents with  . Multiple Sclerosis    Sts. she continues to tolerate Rebif well. Falling more, she thinks because she gets too fatigued, moving to fast, not paying enough attention to what she'd dong.  Recent hospitalization for fall.    HISTORY OF PRESENT ILLNESS:  Catherine Hubbard is a 56 y.o. woman with MS.    MS:   She is on Rebif and tolerates it well.    However, her last injection was 08/17/2016 and she has not yet gotten refill.  She denies any definite exacerbations but she feels balance, footdrop and writing are worse.   Gait/strength/sensation:   She notes a right foot drop and over last year the left leg is also bothering her more.   She reports poor balance .   She does not need a cane.  She has spasticity in both legs, worse on the right and she sometimes gets spasms that are worse at night.      Sometimes she feels like she is being pushed to the right as she walks.   She has tingling in her hands and face.  Baclofen has helped spasticity some.   Cyclobenzaprine has not helped spasms.     Bladder:   She reports urinary frequency and occasional incontinence.   She feels bladder function is worse with recent bladder infection.   stable.,  She was placed on Cephalexin and then switched to doxycycline.   She felt she did better at first on cephalexin but then did worse again.   Cx and Sensitivity shows E. Coli resistance to cephalosporines (also to Bactrim and Cipro) and she is sensitive to tetracycline and nitrofurantoin and gentamicin and meropenem.       Vision:swallow:   She feels this is stable.   She has diplopia laying on either side but not when sitting up.   Also some she reads a while or when very tired.     Dysphagia:   She notes dysphagia, especially with 'sticky' pills.   With EGD, she had some  capsules stucjk in esophagus and some med's were discontinued.    Strictures are being stretched.    Mood/EtOH/Cognition:   Cymbalta was stopped and she was placed on Wellbutrin and Prozac but is doing worse.   The switch was made due to difficulty swallowing.    She still sometimes feels moderately depressed.     She has some apathy.   She drinks less alcohol than last year   She has mild difficulty with cognition, mostly occasional difficulty with short-term memory and verbal fluency (often gets syllables reversed).  She has trouble completing complicated tasks.  .  Fatigue:   She has fatigue and takes Provigil but it was stopped.  She was tried on Ritalin or Adderall once but she did not like the way that she felt when she took it.    Sleep:   She is sleeping poorly due to more pain.   She has some RLS but has not taken anything in maonths for it.   She notes difficulty falling asleep more than with sleep maintenance.   MS History:   She was diagnosed with multiple sclerosis in 2009. Before that time, she had noted some gait ataxia but had not had any imaging studies. In 2009, due to progressive hearing loss, she had an MRI of the brain  and  then had a lumbar puncture that also confirmed the diagnosis.  I have some of her early MRI reports there are several foci noted in the cervical and thoracic spinal cord in the brain, there were multiple hyperintense foci, mostly in the periventricular deep white matter many of these were contrast enhancing on 07/26/2008. There was another enhancing lesion adjacent to C2 in the spinal cord.  Initially, she was placed on Avonex. She did not feel good and she stopped. She then tried Copaxone but also stopped after a while. She moved here in 2014 and saw Dr. Renne Crigler at [facility]. She was placed on Rebif. She tolerates Rebif well.      MRI reports from 12/04/2012  showed multiple foci in the spinal cord at C2, C3-C4, and C4-C5. There were also foci at T4, T7, T9 and T10.  The MRI of the brain showed 2 nonenhancing foci not present from studies in 2011. She reports having an MRI at [facility] 2015 but we do not have those films.   REVIEW OF SYSTEMS: Constitutional: No fevers, chills, sweats, or change in appetite.    She reports fatigue and insomnia Eyes: see above.  No eye pain  But some light sensitivity. Ear, nose and throat: No hearing loss, ear pain, nasal congestion, sore throat.   tinnitus Cardiovascular: No chest pain, palpitations Respiratory: No shortness of breath at rest or with exertion.   Some wheezes, once on asthma med's Gastrointestinal: No nausea, vomiting, diarrhea, abdominal pain, fecal incontinence. Genitourinary: see above Musculoskeletal: see above  She also notes neck pain Integumentary: No rash, pruritus, skin lesions Neurological: as above Psychiatric: as above Endocrine: No palpitations, diaphoresis, change in appetite, change in weigh or increased thirst.  Often feels cold Hematologic/Lymphatic: No anemia, purpura, petechiae. Allergic/Immunologic: No itchy/runny eyes, nasal congestion, recent allergic reactions, rashes  ALLERGIES: Allergies  Allergen Reactions  . 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 Percocet [Oxycodone-Acetaminophen] Nausea And Vomiting    Tolerates with nausea medication    HOME MEDICATIONS:  Current Outpatient Prescriptions:  .  albuterol (PROVENTIL HFA;VENTOLIN HFA) 108 (90 Base) MCG/ACT inhaler, Inhale 2 puffs into the lungs every 6 (six) hours as needed for wheezing or shortness of breath., Disp: , Rfl:  .  buPROPion (WELLBUTRIN) 100 MG tablet, Take 1 tablet (100 mg total) by mouth 3 (three) times daily. Crush pills, Disp: 90 tablet, Rfl: 0 .  Interferon Beta-1a (REBIF REBIDOSE) 44 MCG/0.5ML SOAJ, Inject 44 mcg into the skin 3 (three) times a week., Disp: 36 Syringe, Rfl: 3 .  nabumetone (RELAFEN) 500 MG tablet, Take 1 tablet (500 mg total) by mouth 2 (two) times  daily., Disp: 60 tablet, Rfl: 2 .  omeprazole (PRILOSEC) 20 MG capsule, Take 40 mg by mouth 2 (two) times daily., Disp: , Rfl:  .  protein supplement shake (PREMIER PROTEIN) LIQD, Take 325 mLs (11 oz total) by mouth 3 (three) times daily between meals., Disp: 30 Can, Rfl: 0 .  valsartan (DIOVAN) 80 MG tablet, Take 80 mg by mouth daily., Disp: , Rfl:  .  diazepam (VALIUM) 5 MG tablet, TAKE 1 TABLET BY MOUTH EVERY 12 HOURS AS NEEDED FOR MUSCLE SPASMS (Patient not taking: Reported on 03/21/2017), Disp: 30 tablet, Rfl: 1 .  HYDROcodone-acetaminophen (NORCO/VICODIN) 5-325 MG tablet, Take 1-2 tablets by mouth every 6 (six) hours as needed for severe pain. (Patient not taking: Reported on 03/21/2017), Disp: 6 tablet, Rfl: 0 .  venlafaxine  XR (EFFEXOR XR) 75 MG 24 hr capsule, Take 1 capsule (75 mg total) by mouth daily with breakfast., Disp: 30 capsule, Rfl: 11  PAST MEDICAL HISTORY: Past Medical History:  Diagnosis Date  . Asthma   . Esophagitis   . Headache   . Hearing loss   . Hypertension   . Multiple sclerosis (HCC)   . Vision abnormalities     PAST SURGICAL HISTORY: Past Surgical History:  Procedure Laterality Date  . ANKLE FRACTURE SURGERY Bilateral   . CARPAL TUNNEL RELEASE Bilateral   . COLONOSCOPY  11/2016   multiple  . ESOPHAGOGASTRODUODENOSCOPY  11/2016  . ESOPHAGOGASTRODUODENOSCOPY N/A 02/01/2017   Procedure: ESOPHAGOGASTRODUODENOSCOPY (EGD);  Surgeon: Iva Boop, MD;  Location[facility] ENDOSCOPY;  Service: Endoscopy;  Laterality: N/A;  . ESOPHAGOGASTRODUODENOSCOPY (EGD) WITH PROPOFOL N/A 02/04/2017   Procedure: ESOPHAGOGASTRODUODENOSCOPY (EGD) WITH PROPOFOL;  Surgeon: Meryl Dare, MD;  Location: [facility] ENDOSCOPY;  Service: Endoscopy;  Laterality: N/A;  . LAPAROSCOPIC GASTRIC SLEEVE RESECTION  2011  . ULNAR NERVE REPAIR Bilateral     FAMILY HISTORY: Family History  Problem Relation Age of Onset  . Cancer Mother   . Stroke Mother   . Cancer Father     SOCIAL  HISTORY:  Social History   Social History  . Marital status: Married    Spouse name: N/A  . Number of children: N/A  . Years of education: N/A   Occupational History  . Not on file.   Social History Main Topics  . Smoking status: Former Games developer  . Smokeless tobacco: Never Used  . Alcohol use 0.0 oz/week     Comment: Sts. she drinks beer, liquor daily, varying amts/fim  . Drug use: No  . Sexual activity: No   Other Topics Concern  . Not on file   Social History Narrative  . No narrative on file     PHYSICAL EXAM  Vitals:   03/21/17 1059  BP: (!) 141/85  Pulse: 68  Resp: 16  Weight: 142 lb (64.4 kg)  Height: 5\' 5"  (1.651 m)    Body mass index is 23.63 kg/m.   General: The patient is well-developed and well-nourished and in no acute distress  Neck/HEENT:  The neck is mildly tender.  Good ROM  In neck.       Musculoskeletal:   Moderately severe sternal/costochondral pain on the right.  Mild lumbar tenderness.    Shoulders with good ROM.  Shoulders nontender  Neurologic Exam  Mental status: The patient is alert and oriented x 3 at the time of the examination. The patient has apparent normal recent and remote memory, with an apparently normal attention span and concentration ability.   Speech is normal.  Cranial nerves: Extraocular movements are full.  . There is reduced right facial sensation to soft touch. Facial strength is normal.  Trapezius and sternocleidomastoid strength is normal. No dysarthria is noted.    She has reduced hearing on the right and Weber lateralizes right.  .  Motor:  Muscle bulk is normal.   Tone is normal. Strength is  5 / 5 in all 4 extremities.   Sensory:  She has symmetric sensation to touch and vibration in all 4 limbs.   Coordination: Cerebellar testing reveals good finger-nose-finger  bilaterally.  Gait and station: Station is normal.   Gait is minimally wide and arthritic. She has a mild right foot drop. Tandem gait is mildly  wide.   Reflexes: Deep tendon reflexes are symmetric and mildly increased in  legs bilaterally.       DIAGNOSTIC DATA (LABS, IMAGING, TESTING) - I reviewed patient records, labs, notes, testing and imaging myself where available.     ASSESSMENT AND PLAN  Multiple sclerosis (HCC) - Plan: CBC with Differential/Platelet, Hepatitis B core antibody, total, Hepatitis B surface antigen, Quantiferon tb gold assay (blood), Stratify JCV Antibody Test ([facility]), Comprehensive metabolic panel  High risk medication use - Plan: CBC with Differential/Platelet, Hepatitis B core antibody, total, Hepatitis B surface antigen, Quantiferon tb gold assay (blood), Stratify JCV Antibody Test ([facility]), Comprehensive metabolic panel  Ataxic gait - Plan: Vitamin B12  Depression with anxiety  Dysesthesia  Urinary urgency  Esophageal stricture  Esophageal dysphagia  Cystitis  Numbness - Plan: Vitamin B12   1.   She asked about alternatives to Rebif. We went over options as she was most interested in either ocrelizumab or Tysabri as a may be more efficacious. Over the risks and benefits of both. She will give this further thought and we will check the necessary blood work to determine if she is JCV antibody positive or negative and also to make sure that she does not have hepatitis B or TB.  Continue  Rebif until decision 2.   Take the Precribed doxycycline.   If not better nitrofurantoin.   If not better reculture, may need IV.   3.   Continue baclofen  Diazepam and other med's for spasticity and pain 4.   Change fluoxetine to venlafaxine ER 150 mg (continue bupropion) for mood.   5.   She will return to see me in 4 months or sooner if she has new or worsening neurologic symptoms.  40 minutes face-to-face evaluation with greater than one half the time counseling or coordinating care about her multiple sclerosis and related symptoms.  Richard A. Epimenio Foot, MD, PhD 03/21/2017, 12:52 PM Certified in Neurology,  Clinical Neurophysiology, Sleep Medicine, Pain Medicine and Neuroimaging  [facility] [address] [place], [place] [address] [phone]   .

## 2017-03-21 NOTE — Patient Instructions (Signed)
Stop the Prozac. Start venlafaxine instead.  Make sure to take the doxycycline as prescribed.  If the urinary tract infection is not better in a few days let us know.  We discussed switching from Rebif to either Tysabri or ocrelizumab we will be checking some blood work.

## 2017-03-22 LAB — CBC WITH DIFFERENTIAL/PLATELET
BASOS ABS: 0.1 10*3/uL (ref 0.0–0.2)
Basos: 1 %
EOS (ABSOLUTE): 0.2 10*3/uL (ref 0.0–0.4)
EOS: 3 %
HEMOGLOBIN: 13.1 g/dL (ref 11.1–15.9)
Hematocrit: 39.2 % (ref 34.0–46.6)
IMMATURE GRANULOCYTES: 0 %
Immature Grans (Abs): 0 10*3/uL (ref 0.0–0.1)
LYMPHS ABS: 1.2 10*3/uL (ref 0.7–3.1)
LYMPHS: 28 %
MCH: 31.3 pg (ref 26.6–33.0)
MCHC: 33.4 g/dL (ref 31.5–35.7)
MCV: 94 fL (ref 79–97)
Monocytes Absolute: 1 10*3/uL — ABNORMAL HIGH (ref 0.1–0.9)
Monocytes: 22 %
NEUTROS PCT: 46 %
Neutrophils Absolute: 2.1 10*3/uL (ref 1.4–7.0)
PLATELETS: 243 10*3/uL (ref 150–379)
RBC: 4.18 x10E6/uL (ref 3.77–5.28)
RDW: 14 % (ref 12.3–15.4)
WBC: 4.5 10*3/uL (ref 3.4–10.8)

## 2017-03-22 LAB — COMPREHENSIVE METABOLIC PANEL
A/G RATIO: 1.5 (ref 1.2–2.2)
ALK PHOS: 106 IU/L (ref 39–117)
ALT: 17 IU/L (ref 0–32)
AST: 32 IU/L (ref 0–40)
Albumin: 4.3 g/dL (ref 3.5–5.5)
BUN/Creatinine Ratio: 15 (ref 9–23)
BUN: 8 mg/dL (ref 6–24)
Bilirubin Total: 0.7 mg/dL (ref 0.0–1.2)
CALCIUM: 9.5 mg/dL (ref 8.7–10.2)
CO2: 28 mmol/L (ref 20–29)
Chloride: 96 mmol/L (ref 96–106)
Creatinine, Ser: 0.54 mg/dL — ABNORMAL LOW (ref 0.57–1.00)
GFR, EST AFRICAN AMERICAN: 122 mL/min/{1.73_m2} (ref >59)
GFR, EST NON AFRICAN AMERICAN: 106 mL/min/{1.73_m2} (ref >59)
GLOBULIN, TOTAL: 2.9 g/dL (ref 1.5–4.5)
Glucose: 81 mg/dL (ref 65–99)
POTASSIUM: 4.7 mmol/L (ref 3.5–5.2)
SODIUM: 139 mmol/L (ref 134–144)
Total Protein: 7.2 g/dL (ref 6.0–8.5)

## 2017-03-22 LAB — VITAMIN B12: Vitamin B-12: 541 pg/mL (ref 232–1245)

## 2017-03-22 LAB — HEPATITIS B SURFACE ANTIGEN: HEP B S AG: NEGATIVE

## 2017-03-22 LAB — HEPATITIS B CORE ANTIBODY, TOTAL: HEP B C TOTAL AB: NEGATIVE

## 2017-03-27 LAB — QUANTIFERON IN TUBE
QFT TB AG MINUS NIL VALUE: 0.01 IU/mL
QUANTIFERON NIL VALUE: 0.12 [IU]/mL
QUANTIFERON TB AG VALUE: 0.13 IU/mL
QUANTIFERON TB GOLD: NEGATIVE

## 2017-03-27 LAB — QUANTIFERON TB GOLD ASSAY (BLOOD)

## 2017-03-29 ENCOUNTER — Telehealth: Payer: Self-pay | Admitting: Neurology

## 2017-03-29 NOTE — Telephone Encounter (Signed)
I reach voicemail when I tried to call her with the lab results.  ------------------------------------------------------------------------------ She is JCV antibody negative. Hep B and TB labs were fine.  Since she is JCV antibody negative, Tysabri may be her best option for her MS and we can send in that form if she likes. Ocrelizumab will be ok if she prefers.  I will try to reach her one more time today. If I cannot reach her please call her on Monday.   Please turn in the appropriate service request form after she makes her decision.

## 2017-03-29 NOTE — Telephone Encounter (Signed)
She would like to go on ocrelizumab. We consented the service request form.

## 2017-04-01 ENCOUNTER — Encounter: Payer: Self-pay | Admitting: *Deleted

## 2017-04-01 NOTE — Telephone Encounter (Signed)
Ocrevus srf completed and given to [facility] in the infusion suite/fim

## 2017-04-10 ENCOUNTER — Other Ambulatory Visit: Payer: Self-pay | Admitting: Neurology

## 2017-04-16 DIAGNOSIS — Z9884 Bariatric surgery status: Secondary | ICD-10-CM | POA: Diagnosis not present

## 2017-04-16 DIAGNOSIS — R1314 Dysphagia, pharyngoesophageal phase: Secondary | ICD-10-CM | POA: Diagnosis not present

## 2017-04-16 DIAGNOSIS — K222 Esophageal obstruction: Secondary | ICD-10-CM | POA: Diagnosis not present

## 2017-04-16 DIAGNOSIS — K449 Diaphragmatic hernia without obstruction or gangrene: Secondary | ICD-10-CM | POA: Diagnosis not present

## 2017-04-18 ENCOUNTER — Telehealth: Payer: Self-pay | Admitting: *Deleted

## 2017-04-18 NOTE — Telephone Encounter (Signed)
Fax received from [place] [employer] [place].  Ocrevus PA approved beginning 03/17/17 and will remain in effect for as long as the patient is eligible under this plan./fim

## 2017-05-07 ENCOUNTER — Ambulatory Visit: Payer: Federal, State, Local not specified - PPO | Admitting: Neurology

## 2017-05-28 DIAGNOSIS — K222 Esophageal obstruction: Secondary | ICD-10-CM | POA: Diagnosis not present

## 2017-06-06 DIAGNOSIS — G35 Multiple sclerosis: Secondary | ICD-10-CM | POA: Diagnosis not present

## 2017-06-21 DIAGNOSIS — G35 Multiple sclerosis: Secondary | ICD-10-CM | POA: Diagnosis not present

## 2017-07-04 ENCOUNTER — Other Ambulatory Visit: Payer: Self-pay | Admitting: Neurology

## 2017-07-22 ENCOUNTER — Other Ambulatory Visit: Payer: Self-pay

## 2017-07-22 ENCOUNTER — Encounter: Payer: Self-pay | Admitting: Neurology

## 2017-07-22 ENCOUNTER — Ambulatory Visit: Payer: Federal, State, Local not specified - PPO | Admitting: Neurology

## 2017-07-22 VITALS — BP 146/93 | HR 73 | Resp 14 | Ht 65.0 in | Wt 141.5 lb

## 2017-07-22 DIAGNOSIS — R260 Ataxic gait: Secondary | ICD-10-CM

## 2017-07-22 DIAGNOSIS — R5383 Other fatigue: Secondary | ICD-10-CM | POA: Diagnosis not present

## 2017-07-22 DIAGNOSIS — Z79899 Other long term (current) drug therapy: Secondary | ICD-10-CM

## 2017-07-22 DIAGNOSIS — R200 Anesthesia of skin: Secondary | ICD-10-CM

## 2017-07-22 DIAGNOSIS — G35 Multiple sclerosis: Secondary | ICD-10-CM

## 2017-07-22 DIAGNOSIS — F418 Other specified anxiety disorders: Secondary | ICD-10-CM | POA: Diagnosis not present

## 2017-07-22 NOTE — Progress Notes (Signed)
[facility]  PATIENT: Catherine Hubbard DOB: June 17, 1961   _________________________________   HISTORICAL  CHIEF COMPLAINT:  Chief Complaint  Patient presents with  . Multiple Sclerosis    Has had parts A and B of first Ocrelizumab infusion (in November).  Next infusion due in April, not yet scheduled.  Sts. she had difficulty with word finding last week, but that has resolved./fim    HISTORY OF PRESENT ILLNESS:  Catherine Hubbard is a 56 y.o. woman with MS.    Update 07/22/2017:   She had her first dose of ocrelizumab in November. She tolerated it well and there were no complications. Her next infusion will be at the end of April. For the most part, she feels the MS has been stable. However, she did note some word finding difficulties last week. This is better this week.    She felt mildly stronger since the infusion.   She feels off balanced, especially if she turns fast or someone bumps into her.   She needs to hold on in the shower.  Her right leg is weaker than her left and sometimes has a foot drop, esp if walking longer distances.     She is doing Geophysicist/field seismologist yoga.   She notes spasticity in legs at night, helped by valium.    Late last month,  she noted a few days where she had trouble coming up with the right word.   She feels she is doing better than last week ans almost back to baseline.    Her bladder is about the same.     Vision is mostly stable.   She lost her prescription glasses and uses readers to read.    She notes pain, mostly in the mid back.   Yoga has not helped the pain much.    She notes sometimes bruising more easily, especially after scratching.    Mood is doing ok, helped by duloxetine and Wellbutrin.   Drinking is better.       From 10/09/2016:  MS:   She is on Rebif and tolerates it well.    However, her last injection was 08/17/2016 and she has not yet gotten refill.  She denies any definite exacerbations but she feels balance,  footdrop and writing are worse.   Gait/strength/sensation:   She notes a right foot drop and over last year the left leg is also bothering her more.   She reports poor balance .   She does not need a cane.  She has spasticity in both legs, worse on the right and she sometimes gets spasms that are worse at night.      Sometimes she feels like she is being pushed to the right as she walks.   She has tingling in her hands and face.  Baclofen has helped spasticity some.   Cyclobenzaprine has not helped spasms.     Bladder:   She reports urinary frequency and occasional incontinence.   She feels bladder function is worse with recent bladder infection.   stable.,  She was placed on Cephalexin and then switched to doxycycline.   She felt she did better at first on cephalexin but then did worse again.   Cx and Sensitivity shows E. Coli resistance to cephalosporines (also to Bactrim and Cipro) and she is sensitive to tetracycline and nitrofurantoin and gentamicin and meropenem.       Vision:swallow:   She feels this is stable.   She has diplopia laying on either  side but not when sitting up.   Also some she reads a while or when very tired.     Dysphagia:   She notes dysphagia, especially with 'sticky' pills.   With EGD, she had some capsules stucjk in esophagus and some med's were discontinued.    Strictures are being stretched.    Mood/EtOH/Cognition:   Cymbalta was stopped and she was placed on Wellbutrin and Prozac but is doing worse.   The switch was made due to difficulty swallowing.    She still sometimes feels moderately depressed.     She has some apathy.   She drinks less alcohol than last year   She has mild difficulty with cognition, mostly occasional difficulty with short-term memory and verbal fluency (often gets syllables reversed).  She has trouble completing complicated tasks.  .  Fatigue:   She has fatigue and takes Provigil but it was stopped.  She was tried on Ritalin or Adderall once but she  did not like the way that she felt when she took it.    Sleep:   She is sleeping poorly due to more pain.   She has some RLS but has not taken anything in maonths for it.   She notes difficulty falling asleep more than with sleep maintenance.   MS History:   She was diagnosed with multiple sclerosis in 2009. Before that time, she had noted some gait ataxia but had not had any imaging studies. In 2009, due to progressive hearing loss, she had an MRI of the brain and  then had a lumbar puncture that also confirmed the diagnosis.  I have some of her early MRI reports there are several foci noted in the cervical and thoracic spinal cord in the brain, there were multiple hyperintense foci, mostly in the periventricular deep white matter many of these were contrast enhancing on 07/26/2008. There was another enhancing lesion adjacent to C2 in the spinal cord.  Initially, she was placed on Avonex. She did not feel good and she stopped. She then tried Copaxone but also stopped after a while. She moved here in 2014 and saw Dr. Renne Crigler at [facility]. She was placed on Rebif. She tolerates Rebif well.      MRI reports from 12/04/2012  showed multiple foci in the spinal cord at C2, C3-C4, and C4-C5. There were also foci at T4, T7, T9 and T10. The MRI of the brain showed 2 nonenhancing foci not present from studies in 2011. She reports having an MRI at [facility] 2015 but we do not have those films.   REVIEW OF SYSTEMS: Constitutional: No fevers, chills, sweats, or change in appetite.    She reports fatigue and insomnia Eyes: see above.  No eye pain  But some light sensitivity. Ear, nose and throat: No hearing loss, ear pain, nasal congestion, sore throat.   tinnitus Cardiovascular: No chest pain, palpitations Respiratory: No shortness of breath at rest or with exertion.   Some wheezes, once on asthma med's Gastrointestinal: No nausea, vomiting, diarrhea, abdominal pain, fecal incontinence. Genitourinary: see  above Musculoskeletal: see above  She also notes neck pain Integumentary: No rash, pruritus, skin lesions Neurological: as above Psychiatric: as above Endocrine: No palpitations, diaphoresis, change in appetite, change in weigh or increased thirst.  Often feels cold Hematologic/Lymphatic: No anemia, purpura, petechiae. Allergic/Immunologic: No itchy/runny eyes, nasal congestion, recent allergic reactions, rashes  ALLERGIES: Allergies  Allergen Reactions  . Ibuprofen Other (See Comments)    Cannot take due to  gastric bypass  . Percocet [Oxycodone-Acetaminophen] Nausea And Vomiting    Tolerates with nausea medication    HOME MEDICATIONS:  Current Outpatient Medications:  .  albuterol (PROVENTIL HFA;VENTOLIN HFA) 108 (90 Base) MCG/ACT inhaler, Inhale 2 puffs into the lungs every 6 (six) hours as needed for wheezing or shortness of breath., Disp: , Rfl:  .  buPROPion (WELLBUTRIN) 100 MG tablet, Take 1 tablet (100 mg total) by mouth 3 (three) times daily. Crush pills, Disp: 90 tablet, Rfl: 0 .  diazepam (VALIUM) 5 MG tablet, TAKE 1 TABLET BY MOUTH EVERY 12 HOURS AS NEEDED FOR MUSCLE SPASMS, Disp: 30 tablet, Rfl: 1 .  ocrelizumab 600 mg in sodium chloride 0.9 % 500 mL, Inject 600 mg into the vein every 6 (six) months., Disp: , Rfl:  .  omeprazole (PRILOSEC) 20 MG capsule, Take 40 mg by mouth 2 (two) times daily., Disp: , Rfl:  .  protein supplement shake (PREMIER PROTEIN) LIQD, Take 325 mLs (11 oz total) by mouth 3 (three) times daily between meals., Disp: 30 Can, Rfl: 0 .  valsartan (DIOVAN) 80 MG tablet, Take 80 mg by mouth daily., Disp: , Rfl:  .  DULoxetine (CYMBALTA) 60 MG capsule, Take 60 mg by mouth., Disp: , Rfl:  .  etodolac (LODINE) 400 MG tablet, Take 400 mg by mouth., Disp: , Rfl:  .  HYDROcodone-acetaminophen (NORCO/VICODIN) 5-325 MG tablet, Take 1-2 tablets by mouth every 6 (six) hours as needed for severe pain. (Patient not taking: Reported on 03/21/2017), Disp: 6 tablet, Rfl:  0  PAST MEDICAL HISTORY: Past Medical History:  Diagnosis Date  . Asthma   . Esophagitis   . Headache   . Hearing loss   . Hypertension   . Multiple sclerosis (HCC)   . Vision abnormalities     PAST SURGICAL HISTORY: Past Surgical History:  Procedure Laterality Date  . ANKLE FRACTURE SURGERY Bilateral   . CARPAL TUNNEL RELEASE Bilateral   . COLONOSCOPY  11/2016   multiple  . ESOPHAGOGASTRODUODENOSCOPY  11/2016  . ESOPHAGOGASTRODUODENOSCOPY N/A 02/01/2017   Procedure: ESOPHAGOGASTRODUODENOSCOPY (EGD);  Surgeon: Iva Boop, MD;  Location: [facility] ENDOSCOPY;  Service: Endoscopy;  Laterality: N/A;  . ESOPHAGOGASTRODUODENOSCOPY (EGD) WITH PROPOFOL N/A 02/04/2017   Procedure: ESOPHAGOGASTRODUODENOSCOPY (EGD) WITH PROPOFOL;  Surgeon: Meryl Dare, MD;  Location: [facility] ENDOSCOPY;  Service: Endoscopy;  Laterality: N/A;  . LAPAROSCOPIC GASTRIC SLEEVE RESECTION  2011  . ULNAR NERVE REPAIR Bilateral     FAMILY HISTORY: Family History  Problem Relation Age of Onset  . Cancer Mother   . Stroke Mother   . Cancer Father     SOCIAL HISTORY:  Social History   Socioeconomic History  . Marital status: Married    Spouse name: Not on file  . Number of children: Not on file  . Years of education: Not on file  . Highest education level: Not on file  Social Needs  . Financial resource strain: Not on file  . Food insecurity - worry: Not on file  . Food insecurity - inability: Not on file  . Transportation needs - medical: Not on file  . Transportation needs - non-medical: Not on file  Occupational History  . Not on file  Tobacco Use  . Smoking status: Former Games developer  . Smokeless tobacco: Never Used  Substance and Sexual Activity  . Alcohol use: Yes    Alcohol/week: 0.0 oz    Comment: Sts. she drinks beer, liquor daily, varying amts/fim  . Drug use: No  .  Sexual activity: No  Other Topics Concern  . Not on file  Social History Narrative  . Not on file     PHYSICAL  EXAM  Vitals:   07/22/17 1124  BP: (!) 146/93  Pulse: 73  Resp: 14  Weight: 141 lb 8 oz (64.2 kg)  Height: 5\' 5"  (1.651 m)    Body mass index is 23.55 kg/m.   General: The patient is well-developed and well-nourished and in no acute distress  Neck/HEENT:  The neck is mildly tender.  Good ROM  In neck.       Musculoskeletal:   Moderately severe sternal/costochondral pain on the right.  Mild lumbar tenderness.    Shoulders with good ROM.  Shoulders nontender  Neurologic Exam  Mental status: The patient is alert and oriented x 3 at the time of the examination. The patient has apparent normal recent and remote memory, with an apparently normal attention span and concentration ability.   Speech is normal.  Cranial nerves: Extraocular movements are full.  . There is reduced right facial sensation to soft touch. Facial strength is normal.  Trapezius and sternocleidomastoid strength is normal. No dysarthria is noted.    She has reduced hearing on the right and Weber lateralizes right.  .  Motor:  Muscle bulk is normal.   Tone is normal. Strength is  5 / 5 in all 4 extremities.   Sensory:  She has symmetric sensation to touch and vibration in all 4 limbs.   Coordination: Cerebellar testing reveals good finger-nose-finger  bilaterally.  Gait and station: Station is normal.  The gait is slightly wide. There is a mild foot drop.. Tandem gait is mildly wide.   Reflexes: Deep tendon reflexes are symmetric and mildly increased in legs bilaterally.       DIAGNOSTIC DATA (LABS, IMAGING, TESTING) - I reviewed patient records, labs, notes, testing and imaging myself where available.     ASSESSMENT AND PLAN  Multiple sclerosis (HCC) - Plan: CBC with Differential/Platelet, Comprehensive metabolic panel, MR BRAIN W WO CONTRAST  Numbness - Plan: Vitamin B12  Ataxic gait - Plan: Vitamin B12  High risk medication use  Depression with anxiety  Other fatigue - Plan: TSH   1.     Continue ocrelizumab. Check labs.    MRI of the brain to see if any subclinical MS progression.   If present, consider a different DMT. 2.    Continue Diazepam and other med's for spasticity and pain 3.    Try to stay active and exercise 4.    Cymbalta and bupropion for mood.   5.   She will return to see me in 4 months or sooner if she has new or worsening neurologic symptoms.  40 minutes face-to-face evaluation with greater than one half the time counseling or coordinating care about her multiple sclerosis and related symptoms.  Karel Mowers A. Epimenio FootSater, MD, PhD 07/22/2017, 2:20 PM Certified in Neurology, Clinical Neurophysiology, Sleep Medicine, Pain Medicine and Neuroimaging  [facility] [address], [address] [place], [place] [address] [phone]   .

## 2017-07-23 ENCOUNTER — Telehealth: Payer: Self-pay | Admitting: *Deleted

## 2017-07-23 LAB — CBC WITH DIFFERENTIAL/PLATELET
Basophils Absolute: 0.1 10*3/uL (ref 0.0–0.2)
Basos: 2 %
EOS (ABSOLUTE): 0.1 10*3/uL (ref 0.0–0.4)
Eos: 3 %
HEMOGLOBIN: 11.9 g/dL (ref 11.1–15.9)
Hematocrit: 35.6 % (ref 34.0–46.6)
Immature Grans (Abs): 0 10*3/uL (ref 0.0–0.1)
Immature Granulocytes: 0 %
LYMPHS ABS: 1 10*3/uL (ref 0.7–3.1)
LYMPHS: 32 %
MCH: 32.8 pg (ref 26.6–33.0)
MCHC: 33.4 g/dL (ref 31.5–35.7)
MCV: 98 fL — AB (ref 79–97)
MONOS ABS: 0.6 10*3/uL (ref 0.1–0.9)
Monocytes: 21 %
NEUTROS PCT: 42 %
Neutrophils Absolute: 1.3 10*3/uL — ABNORMAL LOW (ref 1.4–7.0)
Platelets: 269 10*3/uL (ref 150–379)
RBC: 3.63 x10E6/uL — ABNORMAL LOW (ref 3.77–5.28)
RDW: 12.8 % (ref 12.3–15.4)
WBC: 3 10*3/uL — ABNORMAL LOW (ref 3.4–10.8)

## 2017-07-23 LAB — COMPREHENSIVE METABOLIC PANEL
A/G RATIO: 1.6 (ref 1.2–2.2)
ALBUMIN: 4.1 g/dL (ref 3.5–5.5)
ALT: 16 IU/L (ref 0–32)
AST: 36 IU/L (ref 0–40)
Alkaline Phosphatase: 89 IU/L (ref 39–117)
BILIRUBIN TOTAL: 0.6 mg/dL (ref 0.0–1.2)
BUN / CREAT RATIO: 9 (ref 9–23)
BUN: 5 mg/dL — AB (ref 6–24)
CHLORIDE: 94 mmol/L — AB (ref 96–106)
CO2: 33 mmol/L — ABNORMAL HIGH (ref 20–29)
Calcium: 9.6 mg/dL (ref 8.7–10.2)
Creatinine, Ser: 0.56 mg/dL — ABNORMAL LOW (ref 0.57–1.00)
GFR calc non Af Amer: 104 mL/min/{1.73_m2} (ref >59)
GFR, EST AFRICAN AMERICAN: 120 mL/min/{1.73_m2} (ref >59)
GLOBULIN, TOTAL: 2.5 g/dL (ref 1.5–4.5)
Glucose: 84 mg/dL (ref 65–99)
POTASSIUM: 5.1 mmol/L (ref 3.5–5.2)
SODIUM: 141 mmol/L (ref 134–144)
TOTAL PROTEIN: 6.6 g/dL (ref 6.0–8.5)

## 2017-07-23 LAB — TSH: TSH: 1.9 u[IU]/mL (ref 0.450–4.500)

## 2017-07-23 LAB — VITAMIN B12: Vitamin B-12: 871 pg/mL (ref 232–1245)

## 2017-07-23 NOTE — Telephone Encounter (Signed)
-----   Message from Asa Lente, MD sent at 07/23/2017 10:50 AM EST ----- Please let her know that the lab work is okay. Her white blood cells were a little low, but not low enough to worry about.   Thyroid function and B12 were normal

## 2017-07-23 NOTE — Telephone Encounter (Signed)
Spoke with Catherine Hubbard ClientMary Jo and reviewed below lab results with her.  She verbalized understanding of same/fim

## 2017-08-04 ENCOUNTER — Emergency Department (HOSPITAL_COMMUNITY)
Admission: EM | Admit: 2017-08-04 | Discharge: 2017-08-04 | Disposition: A | Discharge status: Home / Self Care | Payer: Federal, State, Local not specified - PPO | Attending: Emergency Medicine | Admitting: Emergency Medicine

## 2017-08-04 ENCOUNTER — Emergency Department (HOSPITAL_COMMUNITY): Payer: Federal, State, Local not specified - PPO

## 2017-08-04 ENCOUNTER — Other Ambulatory Visit: Payer: Self-pay

## 2017-08-04 ENCOUNTER — Encounter (HOSPITAL_COMMUNITY): Payer: Self-pay | Admitting: *Deleted

## 2017-08-04 DIAGNOSIS — J45909 Unspecified asthma, uncomplicated: Secondary | ICD-10-CM | POA: Insufficient documentation

## 2017-08-04 DIAGNOSIS — Z79899 Other long term (current) drug therapy: Secondary | ICD-10-CM | POA: Insufficient documentation

## 2017-08-04 DIAGNOSIS — S0990XA Unspecified injury of head, initial encounter: Secondary | ICD-10-CM | POA: Insufficient documentation

## 2017-08-04 DIAGNOSIS — W01198A Fall on same level from slipping, tripping and stumbling with subsequent striking against other object, initial encounter: Secondary | ICD-10-CM | POA: Diagnosis not present

## 2017-08-04 DIAGNOSIS — Y999 Unspecified external cause status: Secondary | ICD-10-CM | POA: Insufficient documentation

## 2017-08-04 DIAGNOSIS — Y939 Activity, unspecified: Secondary | ICD-10-CM | POA: Diagnosis not present

## 2017-08-04 DIAGNOSIS — Z885 Allergy status to narcotic agent status: Secondary | ICD-10-CM | POA: Diagnosis not present

## 2017-08-04 DIAGNOSIS — R110 Nausea: Secondary | ICD-10-CM | POA: Diagnosis not present

## 2017-08-04 DIAGNOSIS — I10 Essential (primary) hypertension: Secondary | ICD-10-CM | POA: Insufficient documentation

## 2017-08-04 DIAGNOSIS — F0781 Postconcussional syndrome: Secondary | ICD-10-CM | POA: Insufficient documentation

## 2017-08-04 DIAGNOSIS — R4182 Altered mental status, unspecified: Secondary | ICD-10-CM | POA: Diagnosis not present

## 2017-08-04 DIAGNOSIS — R4020 Unspecified coma: Secondary | ICD-10-CM | POA: Diagnosis not present

## 2017-08-04 DIAGNOSIS — Z8669 Personal history of other diseases of the nervous system and sense organs: Secondary | ICD-10-CM | POA: Insufficient documentation

## 2017-08-04 DIAGNOSIS — Z87891 Personal history of nicotine dependence: Secondary | ICD-10-CM | POA: Insufficient documentation

## 2017-08-04 DIAGNOSIS — Y929 Unspecified place or not applicable: Secondary | ICD-10-CM | POA: Insufficient documentation

## 2017-08-04 DIAGNOSIS — G35 Multiple sclerosis: Secondary | ICD-10-CM

## 2017-08-04 DIAGNOSIS — R51 Headache: Secondary | ICD-10-CM | POA: Diagnosis not present

## 2017-08-04 LAB — I-STAT CHEM 8, ED
BUN: 11 mg/dL (ref 6–20)
CREATININE: 0.7 mg/dL (ref 0.44–1.00)
Calcium, Ion: 1.08 mmol/L — ABNORMAL LOW (ref 1.15–1.40)
Chloride: 99 mmol/L — ABNORMAL LOW (ref 101–111)
GLUCOSE: 90 mg/dL (ref 65–99)
HEMATOCRIT: 34 % — AB (ref 36.0–46.0)
HEMOGLOBIN: 11.6 g/dL — AB (ref 12.0–15.0)
Potassium: 3.1 mmol/L — ABNORMAL LOW (ref 3.5–5.1)
Sodium: 141 mmol/L (ref 135–145)
TCO2: 30 mmol/L (ref 22–32)

## 2017-08-04 LAB — CBC
HEMATOCRIT: 34.7 % — AB (ref 36.0–46.0)
HEMOGLOBIN: 12.1 g/dL (ref 12.0–15.0)
MCH: 33.7 pg (ref 26.0–34.0)
MCHC: 34.9 g/dL (ref 30.0–36.0)
MCV: 96.7 fL (ref 78.0–100.0)
Platelets: 256 10*3/uL (ref 150–400)
RBC: 3.59 MIL/uL — ABNORMAL LOW (ref 3.87–5.11)
RDW: 12.4 % (ref 11.5–15.5)
WBC: 4.3 10*3/uL (ref 4.0–10.5)

## 2017-08-04 LAB — RAPID URINE DRUG SCREEN, HOSP PERFORMED
Amphetamines: NOT DETECTED
BARBITURATES: NOT DETECTED
Benzodiazepines: POSITIVE — AB
Cocaine: NOT DETECTED
OPIATES: NOT DETECTED
Tetrahydrocannabinol: NOT DETECTED

## 2017-08-04 LAB — DIFFERENTIAL
BASOS ABS: 0 10*3/uL (ref 0.0–0.1)
BASOS PCT: 1 %
EOS ABS: 0.1 10*3/uL (ref 0.0–0.7)
EOS PCT: 2 %
LYMPHS ABS: 1 10*3/uL (ref 0.7–4.0)
Lymphocytes Relative: 24 %
MONO ABS: 0.4 10*3/uL (ref 0.1–1.0)
MONOS PCT: 9 %
Neutro Abs: 2.8 10*3/uL (ref 1.7–7.7)
Neutrophils Relative %: 64 %

## 2017-08-04 LAB — COMPREHENSIVE METABOLIC PANEL
ALT: 22 U/L (ref 14–54)
AST: 56 U/L — AB (ref 15–41)
Albumin: 3.2 g/dL — ABNORMAL LOW (ref 3.5–5.0)
Alkaline Phosphatase: 81 U/L (ref 38–126)
Anion gap: 8 (ref 5–15)
BILIRUBIN TOTAL: 0.7 mg/dL (ref 0.3–1.2)
BUN: 9 mg/dL (ref 6–20)
CO2: 28 mmol/L (ref 22–32)
Calcium: 8.1 mg/dL — ABNORMAL LOW (ref 8.9–10.3)
Chloride: 102 mmol/L (ref 101–111)
Creatinine, Ser: 0.45 mg/dL (ref 0.44–1.00)
Glucose, Bld: 87 mg/dL (ref 65–99)
POTASSIUM: 3 mmol/L — AB (ref 3.5–5.1)
Sodium: 138 mmol/L (ref 135–145)
TOTAL PROTEIN: 5.9 g/dL — AB (ref 6.5–8.1)

## 2017-08-04 LAB — I-STAT BETA HCG BLOOD, ED (MC, WL, AP ONLY)

## 2017-08-04 LAB — I-STAT TROPONIN, ED: TROPONIN I, POC: 0.01 ng/mL (ref 0.00–0.08)

## 2017-08-04 LAB — APTT: aPTT: 32 seconds (ref 24–36)

## 2017-08-04 LAB — ETHANOL: Alcohol, Ethyl (B): 76 mg/dL — ABNORMAL HIGH (ref <10)

## 2017-08-04 LAB — PROTIME-INR
INR: 1.37
Prothrombin Time: 16.8 seconds — ABNORMAL HIGH (ref 11.4–15.2)

## 2017-08-04 MED ORDER — GADOBENATE DIMEGLUMINE 529 MG/ML IV SOLN: 15.0000 mL | Freq: Once | INTRAVENOUS | Status: DC | PRN

## 2017-08-04 MED ORDER — ONDANSETRON HCL 4 MG PO TABS: 4.0000 mg | ORAL_TABLET | Freq: Three times a day (TID) | ORAL | 0 refills | Status: DC | PRN

## 2017-08-04 MED ORDER — HYDROCODONE-ACETAMINOPHEN 5-325 MG PO TABS: 1.0000 | ORAL_TABLET | Freq: Three times a day (TID) | ORAL | 0 refills | Status: DC | PRN

## 2017-08-04 MED ORDER — ONDANSETRON 4 MG PO TBDP
4.0000 mg | ORAL_TABLET | Freq: Once | ORAL | Status: AC
  Administered 2017-08-04: 4 mg via ORAL
  Filled 2017-08-04: qty 1

## 2017-08-04 MED ORDER — POTASSIUM CHLORIDE CRYS ER 20 MEQ PO TBCR
40.0000 meq | EXTENDED_RELEASE_TABLET | Freq: Once | ORAL | Status: AC
  Administered 2017-08-04: 40 meq via ORAL
  Filled 2017-08-04: qty 2

## 2017-08-04 MED ORDER — OXYCODONE-ACETAMINOPHEN 5-325 MG PO TABS
1.0000 | ORAL_TABLET | Freq: Once | ORAL | Status: AC
  Administered 2017-08-04: 1 via ORAL
  Filled 2017-08-04: qty 1

## 2017-08-04 NOTE — ED Notes (Signed)
Equal grips unsteady on her feet  Needs a little assistance

## 2017-08-04 NOTE — ED Notes (Signed)
Patient transported to CT 

## 2017-08-04 NOTE — ED Triage Notes (Signed)
The pt has multiple complaints hx ms  Since yesterday she has had a headache speech has been different  Stumbling more than usual  She fel several days ago and she thinks some of her symptoms started  Then   Headache for over a week  Her speech is slow but able to understand her.  I smell alcohol in the room both husband and the pt denies that she has been drinking alcohol

## 2017-08-04 NOTE — ED Provider Notes (Signed)
[facility] Provider Note   CSN: [id number] Arrival date & time: 08/04/17  1523     History   Chief Complaint Chief Complaint  Patient presents with  . Altered Mental Status    HPI Catherine Hubbard is a 56 y.o. female with a history of MS who presents the emergency department today for multiple complaints.  Patient is followed by Dr. Epimenio [facility]ater [facility].  Last visit was on 07/22/2017.  Per chart review vision first diagnosed with MS in 2009.  Patient's MS has been stable since new medication of ocrelizumab that she started in novemeber.  At that time the patient admitted to 1 week of difficulty finding words, gait imbalances, weakness of the right leg, intermittent foot drop after a long distances walking.  She is currently on baclofen for leg spasms.  She has a history of diplopia when lying on either side but not with sitting up.  She has a MRI of the brain scheduled to see if any subclinical MS progression has occurred.  She has not had this.  Patient is present with husband.  He notes that the patient fell hitting her head on bricks on Friday, 08/02/2017.  No loss of consciousness.  Since then the patient has been having a dull, achy headache.  She is not on any blood thinners.  She notes that this morning when she awoke she had increased slurred speech.  She reports difficulty in finding words.  She notes that she has poor gait and when driving as she could not stay in her lean due to blurring of vision and ongoing diplopia of her vision (horizontal).  While at work she had episodes of difficulty finding words.  She admits to intermittent bouts of numbness/tingling of her face including the left and right side.  She states she has some twitching of her right lip corner.  No current numbness or tingling of the extremities.  She denies any increased weakness from baseline.  No facial asymmetry.  She denies any alcohol or drug use.  She does  have benzos as needed but has not taken this recently.  No other new medications then ocrelizumab.  She denies any urinary symptoms.  No fever, chills, chest pain, shortness of breath, abdominal pain, nausea/vomiting/diarrhea.  HPI  Past Medical History:  Diagnosis Date  . Asthma   . Esophagitis   . Headache   . Hearing loss   . Hypertension   . Multiple sclerosis (HCC)   . Vision abnormalities     Patient Active Problem List   Diagnosis Date Noted  . Foreign body in esophagus   . Esophageal stricture   . Intractable nausea and vomiting 01/31/2017  . Dysphagia   . Severe protein-calorie malnutrition (HCC)   . Nausea & vomiting 01/30/2017  . Hypokalemia 01/30/2017  . Acute kidney injury (HCC) 01/30/2017  . Cystitis 01/30/2017  . Costochondritis 05/11/2016  . Trochanteric bursitis of both hips 03/28/2016  . Upper back pain 03/28/2016  . Bilateral low back pain with bilateral sciatica 07/13/2015  . Right hip pain 07/13/2015  . Lumbar radicular pain 03/21/2015  . Other fatigue 01/11/2015  . Dysesthesia 01/11/2015  . Urinary urgency 01/11/2015  . Multiple sclerosis (HCC) 10/06/2014  . Numbness 10/06/2014  . Ataxic gait 10/06/2014  . High risk medication use 10/06/2014  . Gastric bypass status for obesity 10/06/2014  . Cognitive changes 10/06/2014  . Depression with anxiety 10/06/2014  . Restless leg 10/06/2014  . Insomnia  10/06/2014  . Essential hypertension 10/06/2014  . Difficulty hearing 01/13/2014  . Other muscle spasm 01/13/2014    Past Surgical History:  Procedure Laterality Date  . ANKLE FRACTURE SURGERY Bilateral   . CARPAL TUNNEL RELEASE Bilateral   . COLONOSCOPY  11/2016   multiple  . ESOPHAGOGASTRODUODENOSCOPY  11/2016  . ESOPHAGOGASTRODUODENOSCOPY N/A 02/01/2017   Procedure: ESOPHAGOGASTRODUODENOSCOPY (EGD);  Surgeon: Iva Boop, MD;  Location: [facility] ENDOSCOPY;  Service: Endoscopy;  Laterality: N/A;  . ESOPHAGOGASTRODUODENOSCOPY (EGD) WITH PROPOFOL  N/A 02/04/2017   Procedure: ESOPHAGOGASTRODUODENOSCOPY (EGD) WITH PROPOFOL;  Surgeon: Meryl Dare, MD;  Location: [facility] ENDOSCOPY;  Service: Endoscopy;  Laterality: N/A;  . LAPAROSCOPIC GASTRIC SLEEVE RESECTION  2011  . ULNAR NERVE REPAIR Bilateral     OB History    No data available       Home Medications    Prior to Admission medications   Medication Sig Start Date End Date Taking? Authorizing Provider  albuterol (PROVENTIL HFA;VENTOLIN HFA) 108 (90 Base) MCG/ACT inhaler Inhale 2 puffs into the lungs every 6 (six) hours as needed for wheezing or shortness of breath.    [provider]  buPROPion (WELLBUTRIN) 100 MG tablet Take 1 tablet (100 mg total) by mouth 3 (three) times daily. Crush pills 02/04/17 02/04/18  Narda Bonds, MD  diazepam (VALIUM) 5 MG tablet TAKE 1 TABLET BY MOUTH EVERY 12 HOURS AS NEEDED FOR MUSCLE SPASMS 07/04/17   Sater, Pearletha Furl, MD  DULoxetine (CYMBALTA) 60 MG capsule Take 60 mg by mouth.    [provider]  etodolac (LODINE) 400 MG tablet Take 400 mg by mouth.    [provider]  HYDROcodone-acetaminophen (NORCO/VICODIN) 5-325 MG tablet Take 1-2 tablets by mouth every 6 (six) hours as needed for severe pain. Patient not taking: Reported on 03/21/2017 03/01/17   Muthersbaugh, Dahlia Client, PA-C  ocrelizumab 600 mg in sodium chloride 0.9 % 500 mL Inject 600 mg into the vein every 6 (six) months.    [provider]  omeprazole (PRILOSEC) 20 MG capsule Take 40 mg by mouth 2 (two) times daily.    [provider]  protein supplement shake (PREMIER PROTEIN) LIQD Take 325 mLs (11 oz total) by mouth 3 (three) times daily between meals. 02/04/17   Narda Bonds, MD  valsartan (DIOVAN) 80 MG tablet Take 80 mg by mouth daily.    [provider]    Family History Family History  Problem Relation Age of Onset  . Cancer Mother   . Stroke Mother   . Cancer Father     Social History Social History   Tobacco Use  .  Smoking status: Former Games developer  . Smokeless tobacco: Never Used  Substance Use Topics  . Alcohol use: Yes    Alcohol/week: 0.0 oz    Comment: Sts. she drinks beer, liquor daily, varying amts/fim  . Drug use: No     Allergies   Ibuprofen and Percocet [oxycodone-acetaminophen]   Review of Systems Review of Systems  All other systems reviewed and are negative.    Physical Exam Updated Vital Signs BP (!) 133/98 (BP Location: Right Arm)   Pulse 89   Temp 98.2 F (36.8 C) (Oral)   Resp 16   Ht 5\' 5"  (1.651 m)   Wt 63.5 kg (140 lb)   SpO2 98%   BMI 23.30 kg/m   Physical Exam  Constitutional: She appears well-developed and well-nourished.  HENT:  Head: Normocephalic and atraumatic.  Right Ear: External ear normal.  Left Ear: External ear normal.  Nose: Nose normal.  Mouth/Throat: Uvula is midline, oropharynx is clear and moist and mucous membranes are normal. No tonsillar exudate.  Eyes: Pupils are equal, round, and reactive to light. Right eye exhibits no discharge. Left eye exhibits no discharge. No scleral icterus.  Neck: Trachea normal. Neck supple. No spinous process tenderness present. No neck rigidity. Normal range of motion present.  Cardiovascular: Normal rate, regular rhythm and intact distal pulses.  No murmur heard. Pulses:      Radial pulses are 2+ on the right side, and 2+ on the left side.       Dorsalis pedis pulses are 2+ on the right side, and 2+ on the left side.       Posterior tibial pulses are 2+ on the right side, and 2+ on the left side.  No lower extremity swelling or edema. Calves symmetric in size bilaterally.  Pulmonary/Chest: Effort normal and breath sounds normal. She exhibits no tenderness.  Abdominal: Soft. Bowel sounds are normal. There is no tenderness. There is no rebound and no guarding.  Musculoskeletal: She exhibits no edema.  Lymphadenopathy:    She has no cervical adenopathy.  Neurological: She is alert.  Mental Status:  Alert,  oriented, thought content appropriate, able to give a coherent history. Speech fluent, possible slurred but without evidence of aphasia. Able to follow 2 step commands without difficulty.  Cranial Nerves:  II:  Peripheral visual fields grossly normal, pupils equal, round, reactive to light. No Afferent Pupillary Defect. Does not horizontal diplopia and blurring. There is  2-3 beats of horizontal Nystagmus.  III,IV, VI: ptosis not present, extra-ocular motions intact bilaterally. V,VII: smile symmetric, eyebrows raise symmetric, facial light touch sensation equal VIII: hearing grossly normal to voice  X: uvula elevates symmetrically  XI: bilateral shoulder shrug symmetric and strong XII: midline tongue extension without fassiculations Motor:  Normal tone. 5/5 in upper and lower extremities bilaterally including strong and equal grip strength and dorsiflexion/plantar flexion Sensory: Sensation intact to light touch in all extremities. Negative Romberg.  Deep Tendon Reflexes: Increased DTR of the lower extremities compared to upper. No clonus.   Cerebellar: normal finger-to-nose with bilateral upper extremities. Normal heel-to -shin balance bilaterally of the lower extremity. No pronator drift.  Gait: wide gait but able. No obvious foot drop.  CV: distal pulses palpable throughout   Skin: Skin is warm and dry. No rash noted. She is not diaphoretic.  Psychiatric: She has a normal mood and affect.  Nursing note and vitals reviewed.    ED Treatments / Results  Labs (all labs ordered are listed, but only abnormal results are displayed) Labs Reviewed  PROTIME-INR - Abnormal; Notable for the following components:      Result Value   Prothrombin Time 16.8 (*)    All other components within normal limits  CBC - Abnormal; Notable for the following components:   RBC 3.59 (*)    HCT 34.7 (*)    All other components within normal limits  COMPREHENSIVE METABOLIC PANEL - Abnormal; Notable for the  following components:   Potassium 3.0 (*)    Calcium 8.1 (*)    Total Protein 5.9 (*)    Albumin 3.2 (*)    AST 56 (*)    All other components within normal limits  RAPID URINE DRUG SCREEN, HOSP PERFORMED - Abnormal; Notable for the following components:   Benzodiazepines POSITIVE (*)    All other components within normal limits  ETHANOL - Abnormal;  Notable for the following components:   Alcohol, Ethyl (B) 76 (*)    All other components within normal limits  I-STAT CHEM 8, ED - Abnormal; Notable for the following components:   Potassium 3.1 (*)    Chloride 99 (*)    Calcium, Ion 1.08 (*)    Hemoglobin 11.6 (*)    HCT 34.0 (*)    All other components within normal limits  APTT  DIFFERENTIAL  I-STAT TROPONIN, ED  CBG MONITORING, ED  I-STAT BETA HCG BLOOD, ED ([facility], [facility], [facility] ONLY)    EKG  EKG Interpretation None       Radiology Ct Head Wo Contrast  Result Date: 08/04/2017 CLINICAL DATA:  Altered level of consciousness. EXAM: CT HEAD WITHOUT CONTRAST TECHNIQUE: Contiguous axial images were obtained from the base of the skull through the vertex without intravenous contrast. COMPARISON:  02/28/2017. FINDINGS: Brain: No evidence of acute infarction, hemorrhage, hydrocephalus, extra-axial collection or mass lesion/mass effect. Multifocal areas of low attenuation within the subcortical and periventricular white matter compatible with chronic small vessel ischemic disease. Mild prominence of the sulci and ventricles noted. Vascular: No hyperdense vessel or unexpected calcification. Skull: Normal. Negative for fracture or focal lesion. Sinuses/Orbits: No acute finding. Other: None. IMPRESSION: 1. No acute intracranial abnormalities. 2. Chronic small vessel ischemic change and brain atrophy. Electronically Signed   By: Signa Kell M.D.   On: 08/04/2017 17:32    Procedures Procedures (including critical care time)  Medications Ordered in ED Medications  gadobenate dimeglumine  (MULTIHANCE) injection 15 mL (not administered)  potassium chloride SA (K-DUR,KLOR-CON) CR tablet 40 mEq (40 mEq Oral Given 08/04/17 2057)  oxyCODONE-acetaminophen (PERCOCET/ROXICET) 5-325 MG per tablet 1 tablet (1 tablet Oral Given 08/04/17 2204)  ondansetron (ZOFRAN-ODT) disintegrating tablet 4 mg (4 mg Oral Given 08/04/17 2204)     Initial Impression / Assessment and Plan / ED Course  I have reviewed the triage vital signs and the nursing notes.  Pertinent labs & imaging results that were available during my care of the patient were reviewed by me and considered in my medical decision making (see chart for details).      56 year old female with a history of MS diagnosed in 2009 and followed by Dr. Epimenio Foot at [facility] neurology presenting to the emergency department today for headache, nausea, slurred speech, difficulty with gait, difficulty finding words, diplopia/visual blurring that is worsened from baseline this a.m.  Patient does admit to fall on Friday, 08/02/2017, with head trauma (without LOC) and has had a headache and nausea since then.  Patient's vital signs are reassuring on presentation.  Her exam is at baseline when compared to previous neurology notes.  No focal neurologic deficits.  Labs are reassuring besides hypokalemia of 3.1.  This was replaced with oral potassium.  CT head showed no acute intracranial abnormalities.  There was chronic small vessel ischemic changes and brain atrophy.  Case discussed with Dr. Amada Jupiter of Neurology who feels this may be related to MS or post concussive in nature.  He asked [place] to obtain a MRI brain with and without contrast.    In the meantime of MRI ethanol did return and was elevated.  MRI shows extensive white matter disease consistent with advanced multiple sclerosis.  It says that this is stable to mildly progressed from 2017.  There is no evidence of acute demyelination.  Spoke with Dr. Wilford Corner who personally looked at the MRI and feels the  symptoms are more consistent with postconcussive syndrome.  Will  treat as so.  Patient cannot have any NSAIDs so will give short supply of Percocet for breakthrough pain and have her take Tylenol as needed otherwise for pain.  She is to follow with her neurologist this week.  I advised brain rest.  Advised her to avoid alcohol, activities that could involve head injury and reasons to return.  Return precautions discussed.  Patient appears safe for discharge.  Final Clinical Impressions(s) / ED Diagnoses   Final diagnoses:  Post concussion syndrome  History of multiple sclerosis    ED Discharge Orders        Ordered    ondansetron (ZOFRAN) 4 MG tablet  Every 8 hours PRN     08/04/17 2155    HYDROcodone-acetaminophen (NORCO/VICODIN) 5-325 MG tablet  Every 8 hours PRN     08/04/17 2203       Princella Pellegrini 08/04/17 2326    Mesner, Barbara Cower, MD 08/05/17 [phone]

## 2017-08-04 NOTE — ED Notes (Addendum)
Patient transported to MRI 

## 2017-08-04 NOTE — ED Notes (Signed)
Delay in collecting etoh,  Pt not in room at this time.

## 2017-08-04 NOTE — ED Notes (Signed)
Delay in lab draw,  Pt not in room 

## 2017-08-04 NOTE — Discharge Instructions (Signed)
Your MRI did not show any evidence of MS Flare. Your symptoms are consistent with post-concussive syndrome. I discussed this with the neurologists here.  Please follow attached handout on your syndrome.  Please follow-up with your neurologist this week. If you develop worsening or new concerning symptoms you can return to the emergency department for re-evaluation.   For pain control you may take: Acetaminophen 650mg  (this is 2 normal strength, 325mg , over the counter pills) up to four times a day. Please do not take more than this. Do not drink alcohol or combine with other medications that have acetaminophen or Ibuprofen as an ingredient (Read the labels!).    For breakthrough pain you may take Norco. Do not drink alcohol drive or operate heavy machinery when taking. You are being provided a prescription for opiates (also known as narcotics) for pain control on an ?as needed? basis.  Opiates can be addictive and should only be used when absolutely necessary for pain control when other alternatives do not work.  We recommend you only use them for the recommended amount of time and only as prescribed.  Please do not take with other sedative medications or alcohol.  Please do not drive, operate machinery, or make important decisions while taking opiates.  Please note that these medications can be addictive and have high abuse potential.  Please keep these medications locked away from children, teenagers or any family members with history of substance abuse. Additionally, these medications may cause constipation - take over the counter stool softeners or add fiber to your diet to treat this (Metamucil, Psyllium Fiber, Colace, Miralax) Further refills will need to be obtained from your primary care doctor and will not be prescribed through the Emergency Department. You will test positive on most drug tests while taking this medication.

## 2017-08-06 ENCOUNTER — Telehealth: Payer: Self-pay | Admitting: Neurology

## 2017-08-06 NOTE — Telephone Encounter (Signed)
LMTC, but her phone made a couple of odd beeping sounds, so I can't be sure if my message actually recorded/fim

## 2017-08-06 NOTE — Telephone Encounter (Signed)
Pt has called and is requesting a call back from RN Faith, pt declined to go into detail re: reason for call.

## 2017-08-08 ENCOUNTER — Other Ambulatory Visit: Payer: Self-pay | Admitting: Neurology

## 2017-08-08 NOTE — Telephone Encounter (Signed)
2nd message left--again, odd beeping noise, so I am not sure message is recording./fim

## 2017-08-22 ENCOUNTER — Telehealth: Payer: Self-pay

## 2017-08-22 NOTE — Telephone Encounter (Signed)
I received a prior auth request for modafinil. I have completed and submitted the PA and should have a determination within 48-72 hours.   Key: [id number]

## 2017-08-22 NOTE — Telephone Encounter (Signed)
Fax received from [facility]EP.(phone# [phone]).  Modafinil PA has beenapproved for dates 07/23/17 thru 08/22/18. No case id or pa # given/fim

## 2017-08-22 NOTE — Telephone Encounter (Signed)
Your PA request has been approved. Additional information will be provided in the approval communication.

## 2017-09-18 DIAGNOSIS — M9905 Segmental and somatic dysfunction of pelvic region: Secondary | ICD-10-CM | POA: Diagnosis not present

## 2017-09-18 DIAGNOSIS — M9902 Segmental and somatic dysfunction of thoracic region: Secondary | ICD-10-CM | POA: Diagnosis not present

## 2017-09-18 DIAGNOSIS — M9903 Segmental and somatic dysfunction of lumbar region: Secondary | ICD-10-CM | POA: Diagnosis not present

## 2017-09-18 DIAGNOSIS — M5414 Radiculopathy, thoracic region: Secondary | ICD-10-CM | POA: Diagnosis not present

## 2017-09-23 ENCOUNTER — Other Ambulatory Visit: Payer: Self-pay | Admitting: Neurology

## 2017-09-23 NOTE — Telephone Encounter (Signed)
I believe I lmom for pt. to call back, but her ans. machine made an odd repetitive beeping noise, so I am not sure a message recorded/fim

## 2017-09-23 NOTE — Telephone Encounter (Signed)
Patient calling to discuss medication Modafinil and also an appointment. She would not go into detail.

## 2017-09-25 DIAGNOSIS — M9903 Segmental and somatic dysfunction of lumbar region: Secondary | ICD-10-CM | POA: Diagnosis not present

## 2017-09-25 DIAGNOSIS — M9902 Segmental and somatic dysfunction of thoracic region: Secondary | ICD-10-CM | POA: Diagnosis not present

## 2017-09-25 DIAGNOSIS — M5414 Radiculopathy, thoracic region: Secondary | ICD-10-CM | POA: Diagnosis not present

## 2017-09-25 DIAGNOSIS — M9905 Segmental and somatic dysfunction of pelvic region: Secondary | ICD-10-CM | POA: Diagnosis not present

## 2017-10-09 ENCOUNTER — Ambulatory Visit: Payer: Federal, State, Local not specified - PPO | Admitting: Neurology

## 2017-10-10 ENCOUNTER — Encounter: Payer: Self-pay | Admitting: Neurology

## 2017-11-08 DIAGNOSIS — M9902 Segmental and somatic dysfunction of thoracic region: Secondary | ICD-10-CM | POA: Diagnosis not present

## 2017-11-08 DIAGNOSIS — M5414 Radiculopathy, thoracic region: Secondary | ICD-10-CM | POA: Diagnosis not present

## 2017-11-08 DIAGNOSIS — M9903 Segmental and somatic dysfunction of lumbar region: Secondary | ICD-10-CM | POA: Diagnosis not present

## 2017-11-08 DIAGNOSIS — M9905 Segmental and somatic dysfunction of pelvic region: Secondary | ICD-10-CM | POA: Diagnosis not present

## 2017-11-09 ENCOUNTER — Other Ambulatory Visit: Payer: Self-pay | Admitting: Neurology

## 2017-11-24 ENCOUNTER — Emergency Department (HOSPITAL_COMMUNITY): Payer: Federal, State, Local not specified - PPO

## 2017-11-24 ENCOUNTER — Emergency Department (HOSPITAL_BASED_OUTPATIENT_CLINIC_OR_DEPARTMENT_OTHER): Payer: Federal, State, Local not specified - PPO

## 2017-11-24 ENCOUNTER — Emergency Department (HOSPITAL_BASED_OUTPATIENT_CLINIC_OR_DEPARTMENT_OTHER)
Admission: EM | Admit: 2017-11-24 | Discharge: 2017-11-24 | Disposition: A | Discharge status: Home / Self Care | Payer: Federal, State, Local not specified - PPO | Attending: Emergency Medicine | Admitting: Emergency Medicine

## 2017-11-24 ENCOUNTER — Other Ambulatory Visit: Payer: Self-pay

## 2017-11-24 ENCOUNTER — Encounter (HOSPITAL_BASED_OUTPATIENT_CLINIC_OR_DEPARTMENT_OTHER): Payer: Self-pay

## 2017-11-24 DIAGNOSIS — Y999 Unspecified external cause status: Secondary | ICD-10-CM | POA: Insufficient documentation

## 2017-11-24 DIAGNOSIS — S299XXA Unspecified injury of thorax, initial encounter: Secondary | ICD-10-CM | POA: Diagnosis not present

## 2017-11-24 DIAGNOSIS — M549 Dorsalgia, unspecified: Secondary | ICD-10-CM | POA: Diagnosis not present

## 2017-11-24 DIAGNOSIS — J45909 Unspecified asthma, uncomplicated: Secondary | ICD-10-CM | POA: Diagnosis not present

## 2017-11-24 DIAGNOSIS — Y9389 Activity, other specified: Secondary | ICD-10-CM | POA: Diagnosis not present

## 2017-11-24 DIAGNOSIS — S3210XA Unspecified fracture of sacrum, initial encounter for closed fracture: Secondary | ICD-10-CM | POA: Insufficient documentation

## 2017-11-24 DIAGNOSIS — S79911A Unspecified injury of right hip, initial encounter: Secondary | ICD-10-CM | POA: Diagnosis not present

## 2017-11-24 DIAGNOSIS — Z79899 Other long term (current) drug therapy: Secondary | ICD-10-CM | POA: Insufficient documentation

## 2017-11-24 DIAGNOSIS — W06XXXA Fall from bed, initial encounter: Secondary | ICD-10-CM | POA: Insufficient documentation

## 2017-11-24 DIAGNOSIS — I10 Essential (primary) hypertension: Secondary | ICD-10-CM | POA: Insufficient documentation

## 2017-11-24 DIAGNOSIS — Y92003 Bedroom of unspecified non-institutional (private) residence as the place of occurrence of the external cause: Secondary | ICD-10-CM | POA: Diagnosis not present

## 2017-11-24 DIAGNOSIS — S79912A Unspecified injury of left hip, initial encounter: Secondary | ICD-10-CM | POA: Diagnosis not present

## 2017-11-24 DIAGNOSIS — S22080A Wedge compression fracture of T11-T12 vertebra, initial encounter for closed fracture: Secondary | ICD-10-CM | POA: Diagnosis not present

## 2017-11-24 DIAGNOSIS — S3992XA Unspecified injury of lower back, initial encounter: Secondary | ICD-10-CM | POA: Diagnosis not present

## 2017-11-24 DIAGNOSIS — Z87891 Personal history of nicotine dependence: Secondary | ICD-10-CM | POA: Insufficient documentation

## 2017-11-24 LAB — CBC WITH DIFFERENTIAL/PLATELET
Basophils Absolute: 0 10*3/uL (ref 0.0–0.1)
Basophils Relative: 1 %
Eosinophils Absolute: 0.1 10*3/uL (ref 0.0–0.7)
Eosinophils Relative: 2 %
HCT: 37.1 % (ref 36.0–46.0)
HEMOGLOBIN: 12.5 g/dL (ref 12.0–15.0)
LYMPHS ABS: 1.3 10*3/uL (ref 0.7–4.0)
Lymphocytes Relative: 28 %
MCH: 32.2 pg (ref 26.0–34.0)
MCHC: 33.7 g/dL (ref 30.0–36.0)
MCV: 95.6 fL (ref 78.0–100.0)
MONOS PCT: 17 %
Monocytes Absolute: 0.8 10*3/uL (ref 0.1–1.0)
NEUTROS PCT: 52 %
Neutro Abs: 2.4 10*3/uL (ref 1.7–7.7)
Platelets: 343 10*3/uL (ref 150–400)
RBC: 3.88 MIL/uL (ref 3.87–5.11)
RDW: 11.5 % (ref 11.5–15.5)
WBC: 4.7 10*3/uL (ref 4.0–10.5)

## 2017-11-24 LAB — BASIC METABOLIC PANEL
Anion gap: 9 (ref 5–15)
BUN: 8 mg/dL (ref 6–20)
CHLORIDE: 95 mmol/L — AB (ref 101–111)
CO2: 30 mmol/L (ref 22–32)
Calcium: 9.2 mg/dL (ref 8.9–10.3)
Creatinine, Ser: 0.53 mg/dL (ref 0.44–1.00)
GFR calc non Af Amer: >60 mL/min (ref >60)
Glucose, Bld: 90 mg/dL (ref 65–99)
Potassium: 4.1 mmol/L (ref 3.5–5.1)
Sodium: 134 mmol/L — ABNORMAL LOW (ref 135–145)

## 2017-11-24 MED ORDER — ONDANSETRON HCL 4 MG/2ML IJ SOLN
4.0000 mg | Freq: Once | INTRAMUSCULAR | Status: AC
  Administered 2017-11-24: 4 mg via INTRAVENOUS
  Filled 2017-11-24: qty 2

## 2017-11-24 MED ORDER — MORPHINE SULFATE (PF) 4 MG/ML IV SOLN
4.0000 mg | Freq: Once | INTRAVENOUS | Status: AC
  Administered 2017-11-24: 4 mg via INTRAVENOUS
  Filled 2017-11-24: qty 1

## 2017-11-24 MED ORDER — HYDROCODONE-ACETAMINOPHEN 5-325 MG PO TABS
2.0000 | ORAL_TABLET | Freq: Once | ORAL | Status: AC
  Administered 2017-11-24: 2 via ORAL
  Filled 2017-11-24: qty 2

## 2017-11-24 MED ORDER — HYDROCODONE-ACETAMINOPHEN 5-325 MG PO TABS: 2.0000 | ORAL_TABLET | ORAL | 0 refills | Status: DC | PRN

## 2017-11-24 MED ORDER — FENTANYL CITRATE (PF) 100 MCG/2ML IJ SOLN
50.0000 ug | Freq: Once | INTRAMUSCULAR | Status: AC
  Administered 2017-11-24: 50 ug via INTRAVENOUS
  Filled 2017-11-24: qty 2

## 2017-11-24 NOTE — ED Notes (Signed)
Called [facility] and spoke with Cassie for transport to [facility] ED.  Accepting Physician Dr. Daiva Huge

## 2017-11-24 NOTE — ED Notes (Signed)
Report given to Asher Muir, Forensic scientist. Charge Nurse is very pleasant.

## 2017-11-24 NOTE — ED Triage Notes (Addendum)
PT reports bilateral hip pain since Tuesday. Greater on the right. Denies injury. Feels similar to previous pinched nerves with associated associated paraesthesias in bilateral legs. PT does have MS and reports incontinence related to that. Last fall 2 weeks ago (fell out of bed after trying to get up).

## 2017-11-24 NOTE — Discharge Instructions (Signed)
Your Mri shows an old fracture to your lumbar spine.  You have a new fracture to your sacrum that is most likely the cause of your pain.  Schedule to Dr Dion Saucier for evaluation and treatment.  Pain medication as needed.

## 2017-11-24 NOTE — ED Notes (Signed)
Patient sent by [facility] for MRI due to hip pain/spinal pain. Hx of MS. No acute trauma

## 2017-11-24 NOTE — ED Notes (Signed)
Attempted to call for report to the charge nurse.

## 2017-11-24 NOTE — ED Provider Notes (Signed)
Pt reports she fell and has pain in her lower back and into the buttock area. Pt was seen at [facility] and sent here for evaluation.  Pt reports some increased pain from transport in ambulance.    Results for orders placed or performed during the hospital encounter of 11/24/17  Basic metabolic panel  Result Value Ref Range   Sodium 134 (L) 135 - 145 mmol/L   Potassium 4.1 3.5 - 5.1 mmol/L   Chloride 95 (L) 101 - 111 mmol/L   CO2 30 22 - 32 mmol/L   Glucose, Bld 90 65 - 99 mg/dL   BUN 8 6 - 20 mg/dL   Creatinine, Ser 1.61 0.44 - 1.00 mg/dL   Calcium 9.2 8.9 - 09.6 mg/dL   GFR calc non Af Amer >60 >60 mL/min   GFR calc Af Amer >60 >60 mL/min   Anion gap 9 5 - 15  CBC with Differential  Result Value Ref Range   WBC 4.7 4.0 - 10.5 K/uL   RBC 3.88 3.87 - 5.11 MIL/uL   Hemoglobin 12.5 12.0 - 15.0 g/dL   HCT 04.5 40.9 - 81.1 %   MCV 95.6 78.0 - 100.0 fL   MCH 32.2 26.0 - 34.0 pg   MCHC 33.7 30.0 - 36.0 g/dL   RDW 91.4 78.2 - 95.6 %   Platelets 343 150 - 400 K/uL   Neutrophils Relative % 52 %   Neutro Abs 2.4 1.7 - 7.7 K/uL   Lymphocytes Relative 28 %   Lymphs Abs 1.3 0.7 - 4.0 K/uL   Monocytes Relative 17 %   Monocytes Absolute 0.8 0.1 - 1.0 K/uL   Eosinophils Relative 2 %   Eosinophils Absolute 0.1 0.0 - 0.7 K/uL   Basophils Relative 1 %   Basophils Absolute 0.0 0.0 - 0.1 K/uL   Ct Thoracic Spine Wo Contrast  Result Date: 11/24/2017 CLINICAL DATA:  Back pain for 2 weeks after a fall. Left posterior hip pain and left leg pain. History of multiple sclerosis. EXAM: CT THORACIC AND LUMBAR SPINE WITHOUT CONTRAST TECHNIQUE: Multidetector CT imaging of the thoracic and lumbar spine was performed without contrast. Multiplanar CT image reconstructions were also generated. COMPARISON:  Multiple exams, including abdomen CT from 01/30/2017 and chest radiograph from 02/28/2017 FINDINGS: CT THORACIC SPINE FINDINGS Alignment: No abnormal subluxation.  Gentle midthoracic kyphosis. Vertebrae: No  thoracic spine fracture is identified. Lower cervical spondylosis and degenerative disc disease at C6-7 and C7-T1. Paraspinal and other soft tissues: Postoperative findings in the stomach. Cholecystectomy. Old healed fractures of the left eighth, ninth, tenth, and eleventh ribs posteriorly. Disc levels: No appreciable bony impingement in the thoracic spine. CT LUMBAR SPINE FINDINGS Segmentation: The lowest lumbar type non-rib-bearing vertebra is labeled as L5. Alignment: There is mild levoconvex lumbar scoliosis but no appreciable subluxation. Vertebrae: 25% superior endplate compression fracture at T12 with 7 mm posterior retropulsion of the superior endplate shown on image 37/12. There is a transverse fracture at the S2-3 level in the sacrum on image 30/12, likely subacute or acute, but without a significant degree of presacral edema. This was not present on 01/30/2017. Paraspinal and other soft tissues: No significant paraspinal hematoma is identified. 3.2 by 2.8 cm cystic lesion of the left ovary. Disc levels: Mild bilateral foraminal impingement at the L5-S1 level due to facet and intervertebral spurring. Loss of disc height at this level. IMPRESSION: CT THORACIC SPINE IMPRESSION 1. No fracture in the thoracic spine is identified. There are old healed fractures of  the left eighth, ninth, tenth, and eleventh ribs. CT LUMBAR SPINE IMPRESSION 1. 25% superior endplate compression fracture at T12 with 7 mm of posterior bony retropulsion along the superior endplate. This is likely subacute although there is little in the way of surrounding paraspinal hematoma. 2. Transverse fracture at the S2-3 level, also likely subacute. 3. 3.2 cm cystic lesion of the left ovary. When clinically feasible, follow up sonography of the pelvis is recommended to further assess. This recommendation follows ACR consensus guidelines: White Paper of the ACR Incidental Findings Committee II on Adnexal Findings. J Am Coll Radiol  2013:10:675-681. 4. Mild bilateral foraminal impingement at L5-S1 primarily due to spurring. Electronically Signed   By: Gaylyn Rong M.D.   On: 11/24/2017 11:16   Ct Lumbar Spine Wo Contrast  Result Date: 11/24/2017 CLINICAL DATA:  Back pain for 2 weeks after a fall. Left posterior hip pain and left leg pain. History of multiple sclerosis. EXAM: CT THORACIC AND LUMBAR SPINE WITHOUT CONTRAST TECHNIQUE: Multidetector CT imaging of the thoracic and lumbar spine was performed without contrast. Multiplanar CT image reconstructions were also generated. COMPARISON:  Multiple exams, including abdomen CT from 01/30/2017 and chest radiograph from 02/28/2017 FINDINGS: CT THORACIC SPINE FINDINGS Alignment: No abnormal subluxation.  Gentle midthoracic kyphosis. Vertebrae: No thoracic spine fracture is identified. Lower cervical spondylosis and degenerative disc disease at C6-7 and C7-T1. Paraspinal and other soft tissues: Postoperative findings in the stomach. Cholecystectomy. Old healed fractures of the left eighth, ninth, tenth, and eleventh ribs posteriorly. Disc levels: No appreciable bony impingement in the thoracic spine. CT LUMBAR SPINE FINDINGS Segmentation: The lowest lumbar type non-rib-bearing vertebra is labeled as L5. Alignment: There is mild levoconvex lumbar scoliosis but no appreciable subluxation. Vertebrae: 25% superior endplate compression fracture at T12 with 7 mm posterior retropulsion of the superior endplate shown on image 37/12. There is a transverse fracture at the S2-3 level in the sacrum on image 30/12, likely subacute or acute, but without a significant degree of presacral edema. This was not present on 01/30/2017. Paraspinal and other soft tissues: No significant paraspinal hematoma is identified. 3.2 by 2.8 cm cystic lesion of the left ovary. Disc levels: Mild bilateral foraminal impingement at the L5-S1 level due to facet and intervertebral spurring. Loss of disc height at this level.  IMPRESSION: CT THORACIC SPINE IMPRESSION 1. No fracture in the thoracic spine is identified. There are old healed fractures of the left eighth, ninth, tenth, and eleventh ribs. CT LUMBAR SPINE IMPRESSION 1. 25% superior endplate compression fracture at T12 with 7 mm of posterior bony retropulsion along the superior endplate. This is likely subacute although there is little in the way of surrounding paraspinal hematoma. 2. Transverse fracture at the S2-3 level, also likely subacute. 3. 3.2 cm cystic lesion of the left ovary. When clinically feasible, follow up sonography of the pelvis is recommended to further assess. This recommendation follows ACR consensus guidelines: White Paper of the ACR Incidental Findings Committee II on Adnexal Findings. J Am Coll Radiol 2013:10:675-681. 4. Mild bilateral foraminal impingement at L5-S1 primarily due to spurring. Electronically Signed   By: Gaylyn Rong M.D.   On: 11/24/2017 11:16   Mr Thoracic Spine Wo Contrast  Result Date: 11/24/2017 CLINICAL DATA:  Bilateral hip pain beginning 5 days ago. Paresthesias in the legs. History of multiple sclerosis. Fell 2 weeks ago. EXAM: MRI THORACIC AND LUMBAR SPINE WITHOUT CONTRAST TECHNIQUE: Multiplanar and multiecho pulse sequences of the thoracic and lumbar spine were obtained without intravenous contrast.  COMPARISON:  Lumbar spine CT and thoracic spine CT same day. FINDINGS: MRI THORACIC SPINE FINDINGS Alignment:  Normal Vertebrae: Normal.  No thoracic fracture. Cord: No thoracic cord abnormality. Spinal canal is quite large with ample subarachnoid space surrounding the cord. Paraspinal and other soft tissues: No acute finding. Disc levels: No significant disc degeneration in the thoracic region. Minimal non-compressive bulging of the disc at T11-12. MRI LUMBAR SPINE FINDINGS Segmentation:  5 lumbar type vertebral bodies. Alignment:  Normal Vertebrae: There is a superior endplate fracture at L1 which does not look acute by  MRI. There is loss of height of about 20%. There is retropulsion of the posterosuperior corner of the vertebral body by about 6 mm, indenting thecal sac but not causing apparent neural compression. There is only minimal residual edema, indicating this is nearly completely healed. No other lumbar fracture. There is a sacral fracture which on the CT looked healed, but on the MRI shows pronounced edema, extending to both sides of the sacrum, right more than left. This is quite likely to be the cause of the patient's pain. This could be an insufficiency fracture with chronic and acute components. Conus medullaris and cauda equina: Conus extends to the L1 level. Conus and cauda equina appear normal. Paraspinal and other soft tissues: 2.8 cm right ovarian cyst. Disc levels: No significant disc pathology in the lumbar region. Mild disc bulges. Mild lower lumbar facet arthritis. No stenosis or neural compression. IMPRESSION: MR THORACIC SPINE IMPRESSION Negative evaluation of the thoracic spine. MR LUMBAR SPINE IMPRESSION I think the superior endplate fracture at L1 is not acute but late subacute and nearly completely healed. Retropulsion of the posterosuperior corner of the vertebral body by 6 mm, indenting the thecal sac but not causing neural compression. The significant finding is probably a sacral fracture with extension to both sacral ala regions, right more than left. There is extensive abnormal edema associated with this so there is certainly an acute component, possibly superimposed upon a chronic component. The finding is most consistent with sacral insufficiency fracture. Electronically Signed   By: Paulina Fusi M.D.   On: 11/24/2017 17:36   Mr Lumbar Spine Wo Contrast  Result Date: 11/24/2017 CLINICAL DATA:  Bilateral hip pain beginning 5 days ago. Paresthesias in the legs. History of multiple sclerosis. Fell 2 weeks ago. EXAM: MRI THORACIC AND LUMBAR SPINE WITHOUT CONTRAST TECHNIQUE: Multiplanar and  multiecho pulse sequences of the thoracic and lumbar spine were obtained without intravenous contrast. COMPARISON:  Lumbar spine CT and thoracic spine CT same day. FINDINGS: MRI THORACIC SPINE FINDINGS Alignment:  Normal Vertebrae: Normal.  No thoracic fracture. Cord: No thoracic cord abnormality. Spinal canal is quite large with ample subarachnoid space surrounding the cord. Paraspinal and other soft tissues: No acute finding. Disc levels: No significant disc degeneration in the thoracic region. Minimal non-compressive bulging of the disc at T11-12. MRI LUMBAR SPINE FINDINGS Segmentation:  5 lumbar type vertebral bodies. Alignment:  Normal Vertebrae: There is a superior endplate fracture at L1 which does not look acute by MRI. There is loss of height of about 20%. There is retropulsion of the posterosuperior corner of the vertebral body by about 6 mm, indenting thecal sac but not causing apparent neural compression. There is only minimal residual edema, indicating this is nearly completely healed. No other lumbar fracture. There is a sacral fracture which on the CT looked healed, but on the MRI shows pronounced edema, extending to both sides of the sacrum, right more than left.  This is quite likely to be the cause of the patient's pain. This could be an insufficiency fracture with chronic and acute components. Conus medullaris and cauda equina: Conus extends to the L1 level. Conus and cauda equina appear normal. Paraspinal and other soft tissues: 2.8 cm right ovarian cyst. Disc levels: No significant disc pathology in the lumbar region. Mild disc bulges. Mild lower lumbar facet arthritis. No stenosis or neural compression. IMPRESSION: MR THORACIC SPINE IMPRESSION Negative evaluation of the thoracic spine. MR LUMBAR SPINE IMPRESSION I think the superior endplate fracture at L1 is not acute but late subacute and nearly completely healed. Retropulsion of the posterosuperior corner of the vertebral body by 6 mm,  indenting the thecal sac but not causing neural compression. The significant finding is probably a sacral fracture with extension to both sacral ala regions, right more than left. There is extensive abnormal edema associated with this so there is certainly an acute component, possibly superimposed upon a chronic component. The finding is most consistent with sacral insufficiency fracture. Electronically Signed   By: Paulina Fusi M.D.   On: 11/24/2017 17:36   Dg Hips Bilat W Or Wo Pelvis 3-4 Views  Result Date: 11/24/2017 CLINICAL DATA:  Patient fell out of bed approximately 2 weeks ago, falling on her buttocks. Persistent BILATERAL hip pain. Initial imaging encounter. EXAM: DG HIP (WITH OR WITHOUT PELVIS) 3-4V BILAT COMPARISON:  Bone window images from CT abdomen and pelvis 10/22/2016. FINDINGS: No acute or subacute fractures involving the bony pelvis or the proximal RIGHT or LEFT femur. Old healed fracture involving the LEFT pubic symphysis, unchanged. Joint spaces in both hips well preserved. Bone mineral density well-preserved. Sacroiliac joints and symphysis pubis intact. Degenerative changes at the lumbosacral junction. IMPRESSION: No acute or subacute osseous abnormality. Old healed fracture involving the LEFT pubic symphysis. Electronically Signed   By: Hulan Saas M.D.   On: 11/24/2017 11:14   Pt counseled on results. Pt given hydrocodone for pain.  Pt advised to follow up with Dr. Dion Saucier for evavaluation.  Pt does have a walker at home.  Current Meds  Medication Sig  . albuterol (PROVENTIL HFA;VENTOLIN HFA) 108 (90 Base) MCG/ACT inhaler Inhale 2 puffs into the lungs every 6 (six) hours as needed for wheezing or shortness of breath.  Marland Kitchen buPROPion (WELLBUTRIN XL) 300 MG 24 hr tablet TAKE 1 TABLET BY MOUTH DAILY  . DULoxetine (CYMBALTA) 60 MG capsule TAKE 1 CAPSULE (60 MG TOTAL) BY MOUTH DAILY.  . modafinil (PROVIGIL) 200 MG tablet TAKE 1 TABLET BY MOUTH EVERY MORNING AND 1 AT NOON  .  ocrelizumab 600 mg in sodium chloride 0.9 % 500 mL Inject 600 mg into the vein every 6 (six) months.  Marland Kitchen omeprazole (PRILOSEC) 20 MG capsule Take 40 mg by mouth 2 (two) times daily.  . ondansetron (ZOFRAN) 4 MG tablet Take 1 tablet (4 mg total) by mouth every 8 (eight) hours as needed for nausea or vomiting.  . protein supplement shake (PREMIER PROTEIN) LIQD Take 325 mLs (11 oz total) by mouth 3 (three) times daily between meals.  An After Visit Summary was printed and given to the patient.    Elson Areas, [place] 11/24/17 Jerene Bears    Arby Barrette, MD 11/29/17 1524

## 2017-11-24 NOTE — ED Provider Notes (Signed)
[facility] EMERGENCY DEPARTMENT Provider Note   CSN: [id number] Arrival date & time: 11/24/17  0913     History   Chief Complaint Chief Complaint  Patient presents with  . Hip Pain    HPI Catherine Hubbard is a 57 y.o. female with PMH/o MS, Asthma, Esophageal stricture who presents for evaluation of lower back and bilateral hip pain that has been progressively worsening over the last 2 weeks. Patient reports that pain initially began after she was trying to get out of bed and slipped, causing her to slide off the bed and hit the ground. Patient reports that she did not her hit her head or have any LOC at the time. Patient reports that since then, she has experienced pain to the lower back and hip. She states that the pain is worsened with movement, with attempting to stand up from a sitting position, with movement of BLE. She has been taking tylenol with minimal improvement in symptoms. She states that she has been able to ambulate but states that the pain is significantly worsened with ambulating and bearing weight. Patient also reports that she has been having some shooting pains down the LLE. She also has been having some intermittent numbness/tingling sensation to the LLE. Patient reports that she has some mild episodes of incontinence that are baseline from her MS. She does report that these episodes have been more frequent since the fall. She has not had any saddle anesthesia. Patient denies any fever, SOB, CP, abdominal pain, nausea/vomiting.   The history is provided by the patient.    Past Medical History:  Diagnosis Date  . Asthma   . Esophagitis   . Headache   . Hearing loss   . Hypertension   . Multiple sclerosis (HCC)   . Vision abnormalities     Patient Active Problem List   Diagnosis Date Noted  . Foreign body in esophagus   . Esophageal stricture   . Intractable nausea and vomiting 01/31/2017  . Dysphagia   . Severe protein-calorie  malnutrition (HCC)   . Nausea & vomiting 01/30/2017  . Hypokalemia 01/30/2017  . Acute kidney injury (HCC) 01/30/2017  . Cystitis 01/30/2017  . Costochondritis 05/11/2016  . Trochanteric bursitis of both hips 03/28/2016  . Upper back pain 03/28/2016  . Bilateral low back pain with bilateral sciatica 07/13/2015  . Right hip pain 07/13/2015  . Lumbar radicular pain 03/21/2015  . Other fatigue 01/11/2015  . Dysesthesia 01/11/2015  . Urinary urgency 01/11/2015  . Multiple sclerosis (HCC) 10/06/2014  . Numbness 10/06/2014  . Ataxic gait 10/06/2014  . High risk medication use 10/06/2014  . Gastric bypass status for obesity 10/06/2014  . Cognitive changes 10/06/2014  . Depression with anxiety 10/06/2014  . Restless leg 10/06/2014  . Insomnia 10/06/2014  . Essential hypertension 10/06/2014  . Difficulty hearing 01/13/2014  . Other muscle spasm 01/13/2014    Past Surgical History:  Procedure Laterality Date  . ANKLE FRACTURE SURGERY Bilateral   . CARPAL TUNNEL RELEASE Bilateral   . COLONOSCOPY  11/2016   multiple  . ESOPHAGOGASTRODUODENOSCOPY  11/2016  . ESOPHAGOGASTRODUODENOSCOPY N/A 02/01/2017   Procedure: ESOPHAGOGASTRODUODENOSCOPY (EGD);  Surgeon: Iva Boop, MD;  Location: [facility] ENDOSCOPY;  Service: Endoscopy;  Laterality: N/A;  . ESOPHAGOGASTRODUODENOSCOPY (EGD) WITH PROPOFOL N/A 02/04/2017   Procedure: ESOPHAGOGASTRODUODENOSCOPY (EGD) WITH PROPOFOL;  Surgeon: Meryl Dare, MD;  Location: [facility] ENDOSCOPY;  Service: Endoscopy;  Laterality: N/A;  . LAPAROSCOPIC GASTRIC SLEEVE RESECTION  2011  .  ULNAR NERVE REPAIR Bilateral      OB History   None      Home Medications    Prior to Admission medications   Medication Sig Start Date End Date Taking? Authorizing Provider  albuterol (PROVENTIL HFA;VENTOLIN HFA) 108 (90 Base) MCG/ACT inhaler Inhale 2 puffs into the lungs every 6 (six) hours as needed for wheezing or shortness of breath.   Yes [provider]    buPROPion (WELLBUTRIN XL) 300 MG 24 hr tablet TAKE 1 TABLET BY MOUTH DAILY 11/11/17  Yes Sater, Pearletha Furl, MD  DULoxetine (CYMBALTA) 60 MG capsule TAKE 1 CAPSULE (60 MG TOTAL) BY MOUTH DAILY. 09/24/17  Yes Sater, Pearletha Furl, MD  modafinil (PROVIGIL) 200 MG tablet TAKE 1 TABLET BY MOUTH EVERY MORNING AND 1 AT NOON 08/09/17  Yes Sater, Pearletha Furl, MD  ocrelizumab 600 mg in sodium chloride 0.9 % 500 mL Inject 600 mg into the vein every 6 (six) months.   Yes [provider]  omeprazole (PRILOSEC) 20 MG capsule Take 40 mg by mouth 2 (two) times daily.   Yes [provider]  ondansetron (ZOFRAN) 4 MG tablet Take 1 tablet (4 mg total) by mouth every 8 (eight) hours as needed for nausea or vomiting. 08/04/17  Yes Maczis, Elmer Sow, PA-C  protein supplement shake (PREMIER PROTEIN) LIQD Take 325 mLs (11 oz total) by mouth 3 (three) times daily between meals. 02/04/17  Yes Narda Bonds, MD  diazepam (VALIUM) 5 MG tablet TAKE 1 TABLET BY MOUTH EVERY 12 HOURS AS NEEDED FOR MUSCLE SPASMS 07/04/17   Sater, Pearletha Furl, MD  HYDROcodone-acetaminophen (NORCO/VICODIN) 5-325 MG tablet Take 2 tablets by mouth every 4 (four) hours as needed. 11/24/17   Elson Areas, PA-C  valsartan (DIOVAN) 80 MG tablet Take 80 mg by mouth daily.    [provider]    Family History Family History  Problem Relation Age of Onset  . Cancer Mother   . Stroke Mother   . Cancer Father     Social History Social History   Tobacco Use  . Smoking status: Former Games developer  . Smokeless tobacco: Never Used  Substance Use Topics  . Alcohol use: Yes    Alcohol/week: 0.0 oz    Comment: Sts. she drinks beer, liquor daily, varying amts/fim  . Drug use: No     Allergies   Ibuprofen and Percocet [oxycodone-acetaminophen]   Review of Systems Review of Systems  Constitutional: Negative for chills and fever.  HENT: Negative for congestion.   Eyes: Negative for visual disturbance.  Respiratory: Negative for cough  and shortness of breath.   Cardiovascular: Negative for chest pain.  Gastrointestinal: Negative for abdominal pain, diarrhea, nausea and vomiting.  Genitourinary: Negative for dysuria and hematuria.  Musculoskeletal: Positive for back pain. Negative for neck pain.       Hip Pain  Skin: Negative for rash.  Neurological: Positive for numbness. Negative for dizziness, weakness and headaches.  Psychiatric/Behavioral: Negative for confusion.     Physical Exam Updated Vital Signs BP (!) 149/101   Pulse 68   Temp 98.3 F (36.8 C) (Oral)   Resp 17   Ht 5\' 5"  (1.651 m)   Wt 58.1 kg (128 lb)   SpO2 98%   BMI 21.30 kg/m   Physical Exam  Constitutional: She is oriented to person, place, and time. She appears well-developed and well-nourished.  HENT:  Head: Normocephalic and atraumatic.  Mouth/Throat: Oropharynx is clear and moist and mucous membranes are  normal.  Eyes: Pupils are equal, round, and reactive to light. Conjunctivae, EOM and lids are normal.  Neck: Full passive range of motion without pain.  Cardiovascular: Normal rate, regular rhythm, normal heart sounds and normal pulses. Exam reveals no gallop and no friction rub.  No murmur heard. Pulses:      Dorsalis pedis pulses are 2+ on the right side, and 2+ on the left side.  Pulmonary/Chest: Effort normal and breath sounds normal.  Abdominal: Soft. Normal appearance. There is no tenderness. There is no rigidity and no guarding.  Musculoskeletal: Normal range of motion.       Arms:      Legs: Tenderness noted to the anterior and posterior left hip. No deformity or crepitus. No overlying ecchymosis, soft tissue swelling, warmth, erythema. Flexion/extension intact but with pain. Pain with internal ROM of left hip. Tenderness noted to the anterior and posterior right hip. No deformity or crepitus. No overlying ecchymosis, soft tissue swelling, warmth, erythema. Flexion/extension intact but with pain. Pain with internal ROM of right  hip.   Neurological: She is alert and oriented to person, place, and time.  Cranial nerves III-XII intact Follows commands, Moves all extremities  5/5 strength to BUE. Limited strength BLE, with left being worse than right. Difficulty ascertaining if this secondary to patient's pain as movement of her extremities causes pain of if this is true neuro deficits.  Patient does reports some decreased sensation from the mid left tib/fib area that extends distally.  Normal finger to nose. No dysdiadochokinesia. No pronator drift. Unable to assess gait secondary to patients pain.  No slurred speech. No facial droop.   Skin: Skin is warm and dry. Capillary refill takes less than 2 seconds.  Good distal cap refill. BLE are not dusky in appearance or cool to touch.  Psychiatric: She has a normal mood and affect. Her speech is normal.  Nursing note and vitals reviewed.    ED Treatments / Results  Labs (all labs ordered are listed, but only abnormal results are displayed) Labs Reviewed  BASIC METABOLIC PANEL - Abnormal; Notable for the following components:      Result Value   Sodium 134 (*)    Chloride 95 (*)    All other components within normal limits  CBC WITH DIFFERENTIAL/PLATELET    EKG None  Radiology Ct Thoracic Spine Wo Contrast  Result Date: 11/24/2017 CLINICAL DATA:  Back pain for 2 weeks after a fall. Left posterior hip pain and left leg pain. History of multiple sclerosis. EXAM: CT THORACIC AND LUMBAR SPINE WITHOUT CONTRAST TECHNIQUE: Multidetector CT imaging of the thoracic and lumbar spine was performed without contrast. Multiplanar CT image reconstructions were also generated. COMPARISON:  Multiple exams, including abdomen CT from 01/30/2017 and chest radiograph from 02/28/2017 FINDINGS: CT THORACIC SPINE FINDINGS Alignment: No abnormal subluxation.  Gentle midthoracic kyphosis. Vertebrae: No thoracic spine fracture is identified. Lower cervical spondylosis and degenerative disc  disease at C6-7 and C7-T1. Paraspinal and other soft tissues: Postoperative findings in the stomach. Cholecystectomy. Old healed fractures of the left eighth, ninth, tenth, and eleventh ribs posteriorly. Disc levels: No appreciable bony impingement in the thoracic spine. CT LUMBAR SPINE FINDINGS Segmentation: The lowest lumbar type non-rib-bearing vertebra is labeled as L5. Alignment: There is mild levoconvex lumbar scoliosis but no appreciable subluxation. Vertebrae: 25% superior endplate compression fracture at T12 with 7 mm posterior retropulsion of the superior endplate shown on image 37/12. There is a transverse fracture at the S2-3 level in the  sacrum on image 30/12, likely subacute or acute, but without a significant degree of presacral edema. This was not present on 01/30/2017. Paraspinal and other soft tissues: No significant paraspinal hematoma is identified. 3.2 by 2.8 cm cystic lesion of the left ovary. Disc levels: Mild bilateral foraminal impingement at the L5-S1 level due to facet and intervertebral spurring. Loss of disc height at this level. IMPRESSION: CT THORACIC SPINE IMPRESSION 1. No fracture in the thoracic spine is identified. There are old healed fractures of the left eighth, ninth, tenth, and eleventh ribs. CT LUMBAR SPINE IMPRESSION 1. 25% superior endplate compression fracture at T12 with 7 mm of posterior bony retropulsion along the superior endplate. This is likely subacute although there is little in the way of surrounding paraspinal hematoma. 2. Transverse fracture at the S2-3 level, also likely subacute. 3. 3.2 cm cystic lesion of the left ovary. When clinically feasible, follow up sonography of the pelvis is recommended to further assess. This recommendation follows ACR consensus guidelines: White Paper of the ACR Incidental Findings Committee II on Adnexal Findings. J Am Coll Radiol 2013:10:675-681. 4. Mild bilateral foraminal impingement at L5-S1 primarily due to spurring.  Electronically Signed   By: Gaylyn Rong M.D.   On: 11/24/2017 11:16   Ct Lumbar Spine Wo Contrast  Result Date: 11/24/2017 CLINICAL DATA:  Back pain for 2 weeks after a fall. Left posterior hip pain and left leg pain. History of multiple sclerosis. EXAM: CT THORACIC AND LUMBAR SPINE WITHOUT CONTRAST TECHNIQUE: Multidetector CT imaging of the thoracic and lumbar spine was performed without contrast. Multiplanar CT image reconstructions were also generated. COMPARISON:  Multiple exams, including abdomen CT from 01/30/2017 and chest radiograph from 02/28/2017 FINDINGS: CT THORACIC SPINE FINDINGS Alignment: No abnormal subluxation.  Gentle midthoracic kyphosis. Vertebrae: No thoracic spine fracture is identified. Lower cervical spondylosis and degenerative disc disease at C6-7 and C7-T1. Paraspinal and other soft tissues: Postoperative findings in the stomach. Cholecystectomy. Old healed fractures of the left eighth, ninth, tenth, and eleventh ribs posteriorly. Disc levels: No appreciable bony impingement in the thoracic spine. CT LUMBAR SPINE FINDINGS Segmentation: The lowest lumbar type non-rib-bearing vertebra is labeled as L5. Alignment: There is mild levoconvex lumbar scoliosis but no appreciable subluxation. Vertebrae: 25% superior endplate compression fracture at T12 with 7 mm posterior retropulsion of the superior endplate shown on image 37/12. There is a transverse fracture at the S2-3 level in the sacrum on image 30/12, likely subacute or acute, but without a significant degree of presacral edema. This was not present on 01/30/2017. Paraspinal and other soft tissues: No significant paraspinal hematoma is identified. 3.2 by 2.8 cm cystic lesion of the left ovary. Disc levels: Mild bilateral foraminal impingement at the L5-S1 level due to facet and intervertebral spurring. Loss of disc height at this level. IMPRESSION: CT THORACIC SPINE IMPRESSION 1. No fracture in the thoracic spine is identified.  There are old healed fractures of the left eighth, ninth, tenth, and eleventh ribs. CT LUMBAR SPINE IMPRESSION 1. 25% superior endplate compression fracture at T12 with 7 mm of posterior bony retropulsion along the superior endplate. This is likely subacute although there is little in the way of surrounding paraspinal hematoma. 2. Transverse fracture at the S2-3 level, also likely subacute. 3. 3.2 cm cystic lesion of the left ovary. When clinically feasible, follow up sonography of the pelvis is recommended to further assess. This recommendation follows ACR consensus guidelines: White Paper of the ACR Incidental Findings Committee II on Adnexal Findings. J Am  Coll Radiol 2013:10:675-681. 4. Mild bilateral foraminal impingement at L5-S1 primarily due to spurring. Electronically Signed   By: Gaylyn Rong M.D.   On: 11/24/2017 11:16   Mr Thoracic Spine Wo Contrast  Result Date: 11/24/2017 CLINICAL DATA:  Bilateral hip pain beginning 5 days ago. Paresthesias in the legs. History of multiple sclerosis. Fell 2 weeks ago. EXAM: MRI THORACIC AND LUMBAR SPINE WITHOUT CONTRAST TECHNIQUE: Multiplanar and multiecho pulse sequences of the thoracic and lumbar spine were obtained without intravenous contrast. COMPARISON:  Lumbar spine CT and thoracic spine CT same day. FINDINGS: MRI THORACIC SPINE FINDINGS Alignment:  Normal Vertebrae: Normal.  No thoracic fracture. Cord: No thoracic cord abnormality. Spinal canal is quite large with ample subarachnoid space surrounding the cord. Paraspinal and other soft tissues: No acute finding. Disc levels: No significant disc degeneration in the thoracic region. Minimal non-compressive bulging of the disc at T11-12. MRI LUMBAR SPINE FINDINGS Segmentation:  5 lumbar type vertebral bodies. Alignment:  Normal Vertebrae: There is a superior endplate fracture at L1 which does not look acute by MRI. There is loss of height of about 20%. There is retropulsion of the posterosuperior corner  of the vertebral body by about 6 mm, indenting thecal sac but not causing apparent neural compression. There is only minimal residual edema, indicating this is nearly completely healed. No other lumbar fracture. There is a sacral fracture which on the CT looked healed, but on the MRI shows pronounced edema, extending to both sides of the sacrum, right more than left. This is quite likely to be the cause of the patient's pain. This could be an insufficiency fracture with chronic and acute components. Conus medullaris and cauda equina: Conus extends to the L1 level. Conus and cauda equina appear normal. Paraspinal and other soft tissues: 2.8 cm right ovarian cyst. Disc levels: No significant disc pathology in the lumbar region. Mild disc bulges. Mild lower lumbar facet arthritis. No stenosis or neural compression. IMPRESSION: MR THORACIC SPINE IMPRESSION Negative evaluation of the thoracic spine. MR LUMBAR SPINE IMPRESSION I think the superior endplate fracture at L1 is not acute but late subacute and nearly completely healed. Retropulsion of the posterosuperior corner of the vertebral body by 6 mm, indenting the thecal sac but not causing neural compression. The significant finding is probably a sacral fracture with extension to both sacral ala regions, right more than left. There is extensive abnormal edema associated with this so there is certainly an acute component, possibly superimposed upon a chronic component. The finding is most consistent with sacral insufficiency fracture. Electronically Signed   By: Paulina Fusi M.D.   On: 11/24/2017 17:36   Mr Lumbar Spine Wo Contrast  Result Date: 11/24/2017 CLINICAL DATA:  Bilateral hip pain beginning 5 days ago. Paresthesias in the legs. History of multiple sclerosis. Fell 2 weeks ago. EXAM: MRI THORACIC AND LUMBAR SPINE WITHOUT CONTRAST TECHNIQUE: Multiplanar and multiecho pulse sequences of the thoracic and lumbar spine were obtained without intravenous contrast.  COMPARISON:  Lumbar spine CT and thoracic spine CT same day. FINDINGS: MRI THORACIC SPINE FINDINGS Alignment:  Normal Vertebrae: Normal.  No thoracic fracture. Cord: No thoracic cord abnormality. Spinal canal is quite large with ample subarachnoid space surrounding the cord. Paraspinal and other soft tissues: No acute finding. Disc levels: No significant disc degeneration in the thoracic region. Minimal non-compressive bulging of the disc at T11-12. MRI LUMBAR SPINE FINDINGS Segmentation:  5 lumbar type vertebral bodies. Alignment:  Normal Vertebrae: There is a superior endplate fracture  at L1 which does not look acute by MRI. There is loss of height of about 20%. There is retropulsion of the posterosuperior corner of the vertebral body by about 6 mm, indenting thecal sac but not causing apparent neural compression. There is only minimal residual edema, indicating this is nearly completely healed. No other lumbar fracture. There is a sacral fracture which on the CT looked healed, but on the MRI shows pronounced edema, extending to both sides of the sacrum, right more than left. This is quite likely to be the cause of the patient's pain. This could be an insufficiency fracture with chronic and acute components. Conus medullaris and cauda equina: Conus extends to the L1 level. Conus and cauda equina appear normal. Paraspinal and other soft tissues: 2.8 cm right ovarian cyst. Disc levels: No significant disc pathology in the lumbar region. Mild disc bulges. Mild lower lumbar facet arthritis. No stenosis or neural compression. IMPRESSION: MR THORACIC SPINE IMPRESSION Negative evaluation of the thoracic spine. MR LUMBAR SPINE IMPRESSION I think the superior endplate fracture at L1 is not acute but late subacute and nearly completely healed. Retropulsion of the posterosuperior corner of the vertebral body by 6 mm, indenting the thecal sac but not causing neural compression. The significant finding is probably a sacral  fracture with extension to both sacral ala regions, right more than left. There is extensive abnormal edema associated with this so there is certainly an acute component, possibly superimposed upon a chronic component. The finding is most consistent with sacral insufficiency fracture. Electronically Signed   By: Paulina Fusi M.D.   On: 11/24/2017 17:36   Dg Hips Bilat W Or Wo Pelvis 3-4 Views  Result Date: 11/24/2017 CLINICAL DATA:  Patient fell out of bed approximately 2 weeks ago, falling on her buttocks. Persistent BILATERAL hip pain. Initial imaging encounter. EXAM: DG HIP (WITH OR WITHOUT PELVIS) 3-4V BILAT COMPARISON:  Bone window images from CT abdomen and pelvis 10/22/2016. FINDINGS: No acute or subacute fractures involving the bony pelvis or the proximal RIGHT or LEFT femur. Old healed fracture involving the LEFT pubic symphysis, unchanged. Joint spaces in both hips well preserved. Bone mineral density well-preserved. Sacroiliac joints and symphysis pubis intact. Degenerative changes at the lumbosacral junction. IMPRESSION: No acute or subacute osseous abnormality. Old healed fracture involving the LEFT pubic symphysis. Electronically Signed   By: Hulan Saas M.D.   On: 11/24/2017 11:14    Procedures Procedures (including critical care time)  Medications Ordered in ED Medications  ondansetron (ZOFRAN) injection 4 mg (4 mg Intravenous Given 11/24/17 1106)  morphine 4 MG/ML injection 4 mg (4 mg Intravenous Given 11/24/17 1106)  fentaNYL (SUBLIMAZE) injection 50 mcg (50 mcg Intravenous Given 11/24/17 1412)  HYDROcodone-acetaminophen (NORCO/VICODIN) 5-325 MG per tablet 2 tablet (2 tablets Oral Given 11/24/17 1832)     Initial Impression / Assessment and Plan / ED Course  I have reviewed the triage vital signs and the nursing notes.  Pertinent labs & imaging results that were available during my care of the patient were reviewed by me and considered in my medical decision making (see chart for  details).     57 year old female with past medical history of MS who presents for evaluation of bilateral hip and lower back pain that began 2 weeks after mechanical fall where she slid off her bed.  Has been having worsening pain since then.  Patient also reports that she has been having worsening urinary incontinence.  She states she has incontinence secondary to  her MS but states that over the last 2 weeks since the incident, and has become more frequent and more severe.  Also reports she is having some left lower extremity numbness.  No fevers, chest pain, difficulty breathing. Patient is afebrile, non-toxic appearing, sitting comfortably on examination table. Vital signs reviewed and stable.  On exam, patient does exhibit some left lower extremity weakness but this is hard to determine if it is secondary to true neuro deficit or because of the pain she is experiencing in her right hip.  She does experience pain whenever she tries to flex and extend the left lower extremity.  She does report some decreased sensation noted to the distal left lower extremity, that ranges all the way down to her toes.  Concern for fracture versus dislocation versus spinal injury.  Will plan for CT L and T-spine, hip x-ray and basic labs.  Hip x-ray negative for any acute fracture or dislocation.  Given that patient has been able to ambulate and bear weight, do not feel that further imaging is indicated for evaluation of occult fracture.  CT is reviewed.  There is a compression fracture noted at T12 that appears to be subacute.  There is also a transverse fracture at the S2-S3 level also likely subacute.  There is bilateral foraminal impingement at the L5-S1 area that appears to be primarily due to sprain.  Given that patient's fall, happened 2 weeks ago, his fractures could be a result of it.  Concern if symptoms are from trauma or MS flare.    Discussed patient with Dr. Amada Jupiter (Neuro) regarding patient's symptoms.  He  recommends getting an MRI of T and L-spine with and without contrast for evaluation of traumatic injury.  If patient's MRIs of L and T-spine are completely negative, he would then evaluate the MRI brain for evaluation of MS flare.  If MRI of T and L-spine are positive, no indication that she would need further MRI of brain for eval of MS flare.  Discussed with Dr. Juleen China (ED).  Accepts patient to transfer to [facility] emergency department for further MRI testing.   Final Clinical Impressions(s) / ED Diagnoses   Final diagnoses:  Closed fracture of sacrum, unspecified portion of sacrum, initial encounter [facility])    ED Discharge Orders        Ordered    HYDROcodone-acetaminophen (NORCO/VICODIN) 5-325 MG tablet  Every 4 hours PRN     11/24/17 1836       Maxwell Caul, PA-C 11/24/17 2011    Tegeler, Canary Brim, MD 11/24/17 2016

## 2017-12-05 ENCOUNTER — Other Ambulatory Visit: Payer: Self-pay

## 2017-12-05 ENCOUNTER — Encounter (HOSPITAL_BASED_OUTPATIENT_CLINIC_OR_DEPARTMENT_OTHER): Payer: Self-pay | Admitting: *Deleted

## 2017-12-05 ENCOUNTER — Emergency Department (HOSPITAL_BASED_OUTPATIENT_CLINIC_OR_DEPARTMENT_OTHER)
Admission: EM | Admit: 2017-12-05 | Discharge: 2017-12-05 | Disposition: A | Discharge status: Home / Self Care | Payer: Federal, State, Local not specified - PPO | Attending: Emergency Medicine | Admitting: Emergency Medicine

## 2017-12-05 DIAGNOSIS — M545 Low back pain, unspecified: Secondary | ICD-10-CM

## 2017-12-05 DIAGNOSIS — Z87891 Personal history of nicotine dependence: Secondary | ICD-10-CM | POA: Diagnosis not present

## 2017-12-05 DIAGNOSIS — J45909 Unspecified asthma, uncomplicated: Secondary | ICD-10-CM | POA: Insufficient documentation

## 2017-12-05 DIAGNOSIS — Z79899 Other long term (current) drug therapy: Secondary | ICD-10-CM | POA: Diagnosis not present

## 2017-12-05 MED ORDER — CYCLOBENZAPRINE HCL 10 MG PO TABS: 10.0000 mg | ORAL_TABLET | Freq: Two times a day (BID) | ORAL | 0 refills | Status: DC | PRN

## 2017-12-05 MED ORDER — HYDROCODONE-ACETAMINOPHEN 5-325 MG PO TABS: 1.0000 | ORAL_TABLET | Freq: Four times a day (QID) | ORAL | 0 refills | Status: DC | PRN

## 2017-12-05 MED ORDER — HYDROCODONE-ACETAMINOPHEN 5-325 MG PO TABS
1.0000 | ORAL_TABLET | Freq: Once | ORAL | Status: AC
  Administered 2017-12-05: 1 via ORAL
  Filled 2017-12-05: qty 1

## 2017-12-05 MED FILL — CYCLOBENZAPRINE HCL 10 MG T: 10 | 5 days supply | Qty: 10 | Fill #0

## 2017-12-05 MED FILL — HYDROCODON-APAP 5-325: 5-325 | 4 days supply | Qty: 16 | Fill #0

## 2017-12-05 NOTE — Discharge Instructions (Addendum)
You may take hydrocodone as needed for severe pain but do not drive, drink alcohol, or operate heavy machinery on this medicine.  You may take Flexeril up to twice daily as needed for muscle spasm but the same restrictions applied.  Do not take any other muscle relaxers while on this medicine including Valium.  Apply ice or heat for comfort, 20 minutes on, 20 minutes off.  Continue to use your walker to get around.  Follow-up with the orthopedic physician for reevaluation of your symptoms as scheduled on the 24th.  We will not be able to refill any more prescriptions for pain medicine, and your primary care physician or the orthopedic physician can continue prescribing this medicine as needed.  Return to the emergency department if any concerning signs or symptoms develop such as fevers, worsening pain or weakness, or loss of pulses

## 2017-12-05 NOTE — ED Provider Notes (Signed)
[facility] EMERGENCY DEPARTMENT Provider Note   CSN: [id number] Arrival date & time: 12/05/17  1517     History   Chief Complaint Chief Complaint  Patient presents with  . Back Pain    HPI Catherine Hubbard is a 57 y.o. female with history of asthma, multiple sclerosis, hypertension presents for evaluation of acute onset,  progressively worsening low back and sacral pain.  She was seen and evaluated on 11/24/17 status post fall with low back and hip pain.  She required MRI at that time which showed subacute L1 superior endplate fracture with 6 mm of retropulsion of the posterior-superior corner of the vertebral body which indented the thecal sac but no neural compression.  More significantly, it showed an acute on chronic sacral fracture with extension of both the sacral alla regions, right more than left.  This is consistent with sacral insufficiency fracture.  She was discharged with 16 tablets of hydrocodone-acetaminophen 5/325 mg and was instructed to follow-up with neurosurgery/spine surgery.  She called Dr. Shelba Flake office with [facility] and has an appointment scheduled with Dr. Charlett Blake on 12/11/17.  She states that she had some relief in pain for 3 days after her visit but pain then worsened.  She states pain is constant, sharp and radiates from both sides of the hips up to the low back.  Pain worsens with bending and improves with laying and ice.  She has been using a walker to ambulate.  She has tried icy hot with mild relief of symptoms as well.  No medications other than hydrocodone.  She states she feels very tired and does not want to move around.  She notes chronic numbness of bilateral feet but states this is unchanged from her baseline.  She does have urinary incontinence and saddle anesthesia at baseline due to her MS which is also chronic and unchanged.  No stool incontinence.  No fevers, IV drug use, or history of cancer.  The history is provided by the patient.     Past Medical History:  Diagnosis Date  . Asthma   . Esophagitis   . Headache   . Hearing loss   . Hypertension   . Multiple sclerosis (HCC)   . Vision abnormalities     Patient Active Problem List   Diagnosis Date Noted  . Foreign body in esophagus   . Esophageal stricture   . Intractable nausea and vomiting 01/31/2017  . Dysphagia   . Severe protein-calorie malnutrition (HCC)   . Nausea & vomiting 01/30/2017  . Hypokalemia 01/30/2017  . Acute kidney injury (HCC) 01/30/2017  . Cystitis 01/30/2017  . Costochondritis 05/11/2016  . Trochanteric bursitis of both hips 03/28/2016  . Upper back pain 03/28/2016  . Bilateral low back pain with bilateral sciatica 07/13/2015  . Right hip pain 07/13/2015  . Lumbar radicular pain 03/21/2015  . Other fatigue 01/11/2015  . Dysesthesia 01/11/2015  . Urinary urgency 01/11/2015  . Multiple sclerosis (HCC) 10/06/2014  . Numbness 10/06/2014  . Ataxic gait 10/06/2014  . High risk medication use 10/06/2014  . Gastric bypass status for obesity 10/06/2014  . Cognitive changes 10/06/2014  . Depression with anxiety 10/06/2014  . Restless leg 10/06/2014  . Insomnia 10/06/2014  . Essential hypertension 10/06/2014  . Difficulty hearing 01/13/2014  . Other muscle spasm 01/13/2014    Past Surgical History:  Procedure Laterality Date  . ANKLE FRACTURE SURGERY Bilateral   . CARPAL TUNNEL RELEASE Bilateral   . COLONOSCOPY  11/2016  multiple  . ESOPHAGOGASTRODUODENOSCOPY  11/2016  . ESOPHAGOGASTRODUODENOSCOPY N/A 02/01/2017   Procedure: ESOPHAGOGASTRODUODENOSCOPY (EGD);  Surgeon: Iva Boop, MD;  Location: [facility] ENDOSCOPY;  Service: Endoscopy;  Laterality: N/A;  . ESOPHAGOGASTRODUODENOSCOPY (EGD) WITH PROPOFOL N/A 02/04/2017   Procedure: ESOPHAGOGASTRODUODENOSCOPY (EGD) WITH PROPOFOL;  Surgeon: Meryl Dare, MD;  Location: [facility] ENDOSCOPY;  Service: Endoscopy;  Laterality: N/A;  . LAPAROSCOPIC GASTRIC SLEEVE RESECTION  2011  . ULNAR  NERVE REPAIR Bilateral      OB History   None      Home Medications    Prior to Admission medications   Medication Sig Start Date End Date Taking? Authorizing Provider  albuterol (PROVENTIL HFA;VENTOLIN HFA) 108 (90 Base) MCG/ACT inhaler Inhale 2 puffs into the lungs every 6 (six) hours as needed for wheezing or shortness of breath.    [provider]  buPROPion (WELLBUTRIN XL) 300 MG 24 hr tablet TAKE 1 TABLET BY MOUTH DAILY 11/11/17   Sater, Pearletha Furl, MD  cyclobenzaprine (FLEXERIL) 10 MG tablet Take 1 tablet (10 mg total) by mouth 2 (two) times daily as needed for muscle spasms. 12/05/17   Porcha Deblanc A, PA-C  diazepam (VALIUM) 5 MG tablet TAKE 1 TABLET BY MOUTH EVERY 12 HOURS AS NEEDED FOR MUSCLE SPASMS 07/04/17   Sater, Pearletha Furl, MD  DULoxetine (CYMBALTA) 60 MG capsule TAKE 1 CAPSULE (60 MG TOTAL) BY MOUTH DAILY. 09/24/17   Sater, Pearletha Furl, MD  HYDROcodone-acetaminophen (NORCO/VICODIN) 5-325 MG tablet Take 1-2 tablets by mouth every 6 (six) hours as needed for severe pain. 12/05/17   Nera Haworth A, PA-C  modafinil (PROVIGIL) 200 MG tablet TAKE 1 TABLET BY MOUTH EVERY MORNING AND 1 AT NOON 08/09/17   Sater, Pearletha Furl, MD  ocrelizumab 600 mg in sodium chloride 0.9 % 500 mL Inject 600 mg into the vein every 6 (six) months.    [provider]  omeprazole (PRILOSEC) 20 MG capsule Take 40 mg by mouth 2 (two) times daily.    [provider]  ondansetron (ZOFRAN) 4 MG tablet Take 1 tablet (4 mg total) by mouth every 8 (eight) hours as needed for nausea or vomiting. 08/04/17   Maczis, Elmer Sow, PA-C  protein supplement shake (PREMIER PROTEIN) LIQD Take 325 mLs (11 oz total) by mouth 3 (three) times daily between meals. 02/04/17   Narda Bonds, MD  valsartan (DIOVAN) 80 MG tablet Take 80 mg by mouth daily.    [provider]    Family History Family History  Problem Relation Age of Onset  . Cancer Mother   . Stroke Mother   . Cancer Father     Social  History Social History   Tobacco Use  . Smoking status: Former Games developer  . Smokeless tobacco: Never Used  Substance Use Topics  . Alcohol use: Yes    Alcohol/week: 0.0 oz    Comment: Sts. she drinks beer, liquor daily, varying amts/fim  . Drug use: No     Allergies   Ibuprofen and Percocet [oxycodone-acetaminophen]   Review of Systems Review of Systems  Constitutional: Negative for chills and fever.  Musculoskeletal: Positive for back pain.  Neurological: Positive for numbness (Chronic, unchanged). Negative for syncope and weakness.     Physical Exam Updated Vital Signs BP (!) 135/105 (BP Location: Right Arm)   Pulse 97   Temp 98.1 F (36.7 C) (Oral)   Resp 20   Ht 5\' 5"  (1.651 m)   Wt 58.1 kg (128 lb)   SpO2  99%   BMI 21.30 kg/m   Physical Exam  Constitutional: She appears well-developed and well-nourished. No distress.  HENT:  Head: Normocephalic and atraumatic.  Eyes: Conjunctivae are normal. Right eye exhibits no discharge. Left eye exhibits no discharge.  Neck: No JVD present. No tracheal deviation present.  Cardiovascular: Normal rate and intact distal pulses.  2+ DP/PT pulses bilaterally, no lower extremity edema  Pulmonary/Chest: Effort normal.  Abdominal: She exhibits no distension.  Musculoskeletal: She exhibits no edema.  Diffuse nonspecific tenderness to palpation of the lower thoracic and lumbar spine and para spinal musculature.  No deformity, crepitus, or step-off noted.  There is also generalized tenderness to palpation of the sacrum, the SI joints, and the ischial tuberosities.  5/5 strength of BUE major muscle groups.  Pain worsens with flexion and extension of the lumbar spine.  Neurological: She is alert.  Fluent speech with no evidence of dysarthria or aphasia, no facial droop, sensation intact to soft touch of bilateral lower extremities.  Slow, antalgic gait with a walker but patient able to Heel Walk and Toe Walk without difficulty.  Skin:  Skin is warm and dry. No erythema.  No skin breakdown or decubitus ulcers noted  Psychiatric: She has a normal mood and affect. Her behavior is normal.  Nursing note and vitals reviewed.    ED Treatments / Results  Labs (all labs ordered are listed, but only abnormal results are displayed) Labs Reviewed - No data to display  EKG None  Radiology No results found.  Procedures Procedures (including critical care time)  Medications Ordered in ED Medications  HYDROcodone-acetaminophen (NORCO/VICODIN) 5-325 MG per tablet 1 tablet (has no administration in time range)     Initial Impression / Assessment and Plan / ED Course  I have reviewed the triage vital signs and the nursing notes.  Pertinent labs & imaging results that were available during my care of the patient were reviewed by me and considered in my medical decision making (see chart for details).     Patient presented with ongoing back pain from known sacral fracture secondary to fall.  She is afebrile, vital signs are stable.  She is nontoxic in appearance.  No red flag signs concerning for cauda equina or spinal abscess.  Doubt MS flare or dissection.  Her neurological status is at her baseline she has no focal deficits on my examination today.  She is ambulatory with the assistance of her walker.  She does have follow-up scheduled with orthopedics in 6 days.  We will refill her Norco which she tolerated and give her Flexeril which she states has been helpful in the past.  Instructed her to discontinue her Valium if she is going to take the Flexeril.  I advised her of the appropriate use of these medications and encouraged her to continue with ice and heat therapy.  I explained to the patient that we cannot continue to refill her pain medicine as this may be a chronic problem and that she must follow-up with orthopedics for reevaluation and further medication refills if needed.  Discussed strict ED return precautions. Pt  verbalized understanding of and agreement with plan and is safe for discharge home at this time. discussed case with Dr. Fayrene Fearing who agrees with assessment and plan at this time.  Final Clinical Impressions(s) / ED Diagnoses   Final diagnoses:  Acute bilateral low back pain without sciatica    ED Discharge Orders        Ordered  HYDROcodone-acetaminophen (NORCO/VICODIN) 5-325 MG tablet  Every 6 hours PRN     12/05/17 1719    cyclobenzaprine (FLEXERIL) 10 MG tablet  2 times daily PRN     12/05/17 1719       Jeanie Sewer, PA-C 12/05/17 1733    Rolland Porter, MD 12/06/17 2022

## 2017-12-05 NOTE — ED Triage Notes (Signed)
Back pain. Hx of fx in her lower back as a result of a fall 10 days ago.

## 2017-12-11 DIAGNOSIS — S32010A Wedge compression fracture of first lumbar vertebra, initial encounter for closed fracture: Secondary | ICD-10-CM | POA: Diagnosis not present

## 2017-12-11 DIAGNOSIS — S3210XA Unspecified fracture of sacrum, initial encounter for closed fracture: Secondary | ICD-10-CM | POA: Diagnosis not present

## 2017-12-14 ENCOUNTER — Emergency Department (HOSPITAL_BASED_OUTPATIENT_CLINIC_OR_DEPARTMENT_OTHER): Payer: Federal, State, Local not specified - PPO

## 2017-12-14 ENCOUNTER — Other Ambulatory Visit: Payer: Self-pay

## 2017-12-14 ENCOUNTER — Emergency Department (HOSPITAL_BASED_OUTPATIENT_CLINIC_OR_DEPARTMENT_OTHER)
Admission: EM | Admit: 2017-12-14 | Discharge: 2017-12-14 | Disposition: A | Discharge status: Home / Self Care | Payer: Federal, State, Local not specified - PPO | Attending: Emergency Medicine | Admitting: Emergency Medicine

## 2017-12-14 ENCOUNTER — Encounter (HOSPITAL_BASED_OUTPATIENT_CLINIC_OR_DEPARTMENT_OTHER): Payer: Self-pay | Admitting: Emergency Medicine

## 2017-12-14 DIAGNOSIS — K209 Esophagitis, unspecified without bleeding: Secondary | ICD-10-CM

## 2017-12-14 DIAGNOSIS — Z79899 Other long term (current) drug therapy: Secondary | ICD-10-CM | POA: Diagnosis not present

## 2017-12-14 DIAGNOSIS — J45909 Unspecified asthma, uncomplicated: Secondary | ICD-10-CM | POA: Diagnosis not present

## 2017-12-14 DIAGNOSIS — N3000 Acute cystitis without hematuria: Secondary | ICD-10-CM | POA: Diagnosis not present

## 2017-12-14 DIAGNOSIS — R1013 Epigastric pain: Secondary | ICD-10-CM | POA: Diagnosis not present

## 2017-12-14 DIAGNOSIS — N3001 Acute cystitis with hematuria: Secondary | ICD-10-CM | POA: Diagnosis not present

## 2017-12-14 DIAGNOSIS — I10 Essential (primary) hypertension: Secondary | ICD-10-CM | POA: Insufficient documentation

## 2017-12-14 DIAGNOSIS — E876 Hypokalemia: Secondary | ICD-10-CM | POA: Insufficient documentation

## 2017-12-14 DIAGNOSIS — R1110 Vomiting, unspecified: Secondary | ICD-10-CM | POA: Insufficient documentation

## 2017-12-14 DIAGNOSIS — Z87891 Personal history of nicotine dependence: Secondary | ICD-10-CM | POA: Insufficient documentation

## 2017-12-14 DIAGNOSIS — K210 Gastro-esophageal reflux disease with esophagitis: Secondary | ICD-10-CM | POA: Diagnosis not present

## 2017-12-14 DIAGNOSIS — R109 Unspecified abdominal pain: Secondary | ICD-10-CM | POA: Diagnosis not present

## 2017-12-14 DIAGNOSIS — R112 Nausea with vomiting, unspecified: Secondary | ICD-10-CM | POA: Diagnosis not present

## 2017-12-14 LAB — CBC WITH DIFFERENTIAL/PLATELET
BASOS PCT: 1 %
Basophils Absolute: 0.1 10*3/uL (ref 0.0–0.1)
EOS PCT: 2 %
Eosinophils Absolute: 0.1 10*3/uL (ref 0.0–0.7)
HEMATOCRIT: 35.7 % — AB (ref 36.0–46.0)
Hemoglobin: 13.1 g/dL (ref 12.0–15.0)
LYMPHS ABS: 1.6 10*3/uL (ref 0.7–4.0)
Lymphocytes Relative: 22 %
MCH: 33.9 pg (ref 26.0–34.0)
MCHC: 36.7 g/dL — ABNORMAL HIGH (ref 30.0–36.0)
MCV: 92.2 fL (ref 78.0–100.0)
MONO ABS: 1 10*3/uL (ref 0.1–1.0)
MONOS PCT: 14 %
Neutro Abs: 4.6 10*3/uL (ref 1.7–7.7)
Neutrophils Relative %: 61 %
PLATELETS: 297 10*3/uL (ref 150–400)
RBC: 3.87 MIL/uL (ref 3.87–5.11)
RDW: 11.3 % — AB (ref 11.5–15.5)
WBC: 7.4 10*3/uL (ref 4.0–10.5)

## 2017-12-14 LAB — LIPASE, BLOOD: Lipase: 74 U/L — ABNORMAL HIGH (ref 11–51)

## 2017-12-14 LAB — URINALYSIS, MICROSCOPIC (REFLEX): RBC / HPF: NONE SEEN RBC/hpf (ref 0–5)

## 2017-12-14 LAB — URINALYSIS, ROUTINE W REFLEX MICROSCOPIC
Bilirubin Urine: NEGATIVE
GLUCOSE, UA: NEGATIVE mg/dL
HGB URINE DIPSTICK: NEGATIVE
Ketones, ur: NEGATIVE mg/dL
Nitrite: NEGATIVE
PROTEIN: NEGATIVE mg/dL
Specific Gravity, Urine: <1.005 — ABNORMAL LOW (ref 1.005–1.030)
pH: 6.5 (ref 5.0–8.0)

## 2017-12-14 LAB — COMPREHENSIVE METABOLIC PANEL
ALT: 15 U/L (ref 14–54)
AST: 27 U/L (ref 15–41)
Albumin: 3.8 g/dL (ref 3.5–5.0)
Alkaline Phosphatase: 133 U/L — ABNORMAL HIGH (ref 38–126)
Anion gap: 10 (ref 5–15)
BILIRUBIN TOTAL: 0.7 mg/dL (ref 0.3–1.2)
BUN: 12 mg/dL (ref 6–20)
CHLORIDE: 90 mmol/L — AB (ref 101–111)
CO2: 30 mmol/L (ref 22–32)
CREATININE: 0.41 mg/dL — AB (ref 0.44–1.00)
Calcium: 9.2 mg/dL (ref 8.9–10.3)
Glucose, Bld: 99 mg/dL (ref 65–99)
POTASSIUM: 3.3 mmol/L — AB (ref 3.5–5.1)
Sodium: 130 mmol/L — ABNORMAL LOW (ref 135–145)
TOTAL PROTEIN: 6.6 g/dL (ref 6.5–8.1)

## 2017-12-14 LAB — TROPONIN I: Troponin I: <0.03 ng/mL (ref <0.03)

## 2017-12-14 MED ORDER — POTASSIUM CHLORIDE 20 MEQ/15ML (10%) PO SOLN
40.0000 meq | Freq: Once | ORAL | Status: AC
  Administered 2017-12-14: 40 meq via ORAL

## 2017-12-14 MED ORDER — FAMOTIDINE IN NACL 20-0.9 MG/50ML-% IV SOLN
20.0000 mg | Freq: Once | INTRAVENOUS | Status: AC
  Administered 2017-12-14: 20 mg via INTRAVENOUS
  Filled 2017-12-14: qty 50

## 2017-12-14 MED ORDER — FAMOTIDINE 20 MG PO TABS: 20.0000 mg | ORAL_TABLET | Freq: Two times a day (BID) | ORAL | 0 refills | Status: DC | PRN

## 2017-12-14 MED ORDER — IOPAMIDOL (ISOVUE-300) INJECTION 61%
100.0000 mL | Freq: Once | INTRAVENOUS | Status: AC | PRN
  Administered 2017-12-14: 100 mL via INTRAVENOUS

## 2017-12-14 MED ORDER — CIPROFLOXACIN HCL 500 MG PO TABS
500.0000 mg | ORAL_TABLET | Freq: Once | ORAL | Status: AC
  Administered 2017-12-14: 500 mg via ORAL
  Filled 2017-12-14: qty 1

## 2017-12-14 MED ORDER — ONDANSETRON 4 MG PO TBDP: ORAL_TABLET | ORAL | 0 refills | Status: DC

## 2017-12-14 MED ORDER — POTASSIUM CHLORIDE CRYS ER 20 MEQ PO TBCR
40.0000 meq | EXTENDED_RELEASE_TABLET | Freq: Once | ORAL | Status: DC
  Filled 2017-12-14: qty 2

## 2017-12-14 MED ORDER — PANTOPRAZOLE SODIUM 40 MG PO TBEC
40.0000 mg | DELAYED_RELEASE_TABLET | Freq: Once | ORAL | Status: AC
  Administered 2017-12-14: 40 mg via ORAL
  Filled 2017-12-14: qty 1

## 2017-12-14 MED ORDER — CIPROFLOXACIN HCL 500 MG PO TABS: 500.0000 mg | ORAL_TABLET | Freq: Two times a day (BID) | ORAL | 0 refills | Status: DC

## 2017-12-14 MED ORDER — MORPHINE SULFATE (PF) 4 MG/ML IV SOLN
4.0000 mg | Freq: Once | INTRAVENOUS | Status: AC
  Administered 2017-12-14: 4 mg via INTRAVENOUS
  Filled 2017-12-14: qty 1

## 2017-12-14 MED ORDER — POTASSIUM CHLORIDE 20 MEQ/15ML (10%) PO SOLN
ORAL | Status: AC
  Filled 2017-12-14: qty 30

## 2017-12-14 MED ORDER — ONDANSETRON HCL 4 MG/2ML IJ SOLN
4.0000 mg | Freq: Once | INTRAMUSCULAR | Status: AC
  Administered 2017-12-14: 4 mg via INTRAVENOUS
  Filled 2017-12-14: qty 2

## 2017-12-14 MED ORDER — SODIUM CHLORIDE 0.9 % IV BOLUS
1000.0000 mL | Freq: Once | INTRAVENOUS | Status: AC
  Administered 2017-12-14: 1000 mL via INTRAVENOUS

## 2017-12-14 NOTE — ED Provider Notes (Signed)
[facility] EMERGENCY DEPARTMENT Provider Note   CSN: [id number] Arrival date & time: 12/14/17  0554     History   Chief Complaint Chief Complaint  Patient presents with  . Abdominal Pain    HPI Catherine Hubbard is a 57 y.o. female hx of multiple sclerosis, previous esophageal stricture and esophagitis, HTN, previous gastric sleeve, here with abdominal pain, vomiting. Patient states that she had acute onset of epigastric pain that started yesterday. Pain is crampy and radiate to RUQ. Associated with about 8 episodes of dark brown vomit that is not bilious or frankly bloody. Denies being on blood thinners or taking NSAIDs and she is taking prilosec at baseline. She denies any fevers or diarrhea. Denies sick contact or recent travels or bad food. Patient has hx of multiple sclerosis but denies being weaker than usual. Had a fall a month ago and had spinal fractures on MRI and had seen ortho outpatient already and is managed conservatively.    The history is provided by the patient.    Past Medical History:  Diagnosis Date  . Asthma   . Esophagitis   . Headache   . Hearing loss   . Hypertension   . Multiple sclerosis (HCC)   . Vision abnormalities     Patient Active Problem List   Diagnosis Date Noted  . Foreign body in esophagus   . Esophageal stricture   . Intractable nausea and vomiting 01/31/2017  . Dysphagia   . Severe protein-calorie malnutrition (HCC)   . Nausea & vomiting 01/30/2017  . Hypokalemia 01/30/2017  . Acute kidney injury (HCC) 01/30/2017  . Cystitis 01/30/2017  . Costochondritis 05/11/2016  . Trochanteric bursitis of both hips 03/28/2016  . Upper back pain 03/28/2016  . Bilateral low back pain with bilateral sciatica 07/13/2015  . Right hip pain 07/13/2015  . Lumbar radicular pain 03/21/2015  . Other fatigue 01/11/2015  . Dysesthesia 01/11/2015  . Urinary urgency 01/11/2015  . Multiple sclerosis (HCC) 10/06/2014  . Numbness  10/06/2014  . Ataxic gait 10/06/2014  . High risk medication use 10/06/2014  . Gastric bypass status for obesity 10/06/2014  . Cognitive changes 10/06/2014  . Depression with anxiety 10/06/2014  . Restless leg 10/06/2014  . Insomnia 10/06/2014  . Essential hypertension 10/06/2014  . Difficulty hearing 01/13/2014  . Other muscle spasm 01/13/2014    Past Surgical History:  Procedure Laterality Date  . ANKLE FRACTURE SURGERY Bilateral   . CARPAL TUNNEL RELEASE Bilateral   . COLONOSCOPY  11/2016   multiple  . ESOPHAGOGASTRODUODENOSCOPY  11/2016  . ESOPHAGOGASTRODUODENOSCOPY N/A 02/01/2017   Procedure: ESOPHAGOGASTRODUODENOSCOPY (EGD);  Surgeon: Iva Boop, MD;  Location: [facility] ENDOSCOPY;  Service: Endoscopy;  Laterality: N/A;  . ESOPHAGOGASTRODUODENOSCOPY (EGD) WITH PROPOFOL N/A 02/04/2017   Procedure: ESOPHAGOGASTRODUODENOSCOPY (EGD) WITH PROPOFOL;  Surgeon: Meryl Dare, MD;  Location: [facility] ENDOSCOPY;  Service: Endoscopy;  Laterality: N/A;  . LAPAROSCOPIC GASTRIC SLEEVE RESECTION  2011  . ULNAR NERVE REPAIR Bilateral      OB History   None      Home Medications    Prior to Admission medications   Medication Sig Start Date End Date Taking? Authorizing Provider  albuterol (PROVENTIL HFA;VENTOLIN HFA) 108 (90 Base) MCG/ACT inhaler Inhale 2 puffs into the lungs every 6 (six) hours as needed for wheezing or shortness of breath.    [provider]  buPROPion (WELLBUTRIN XL) 300 MG 24 hr tablet TAKE 1 TABLET BY MOUTH DAILY 11/11/17   Sater, Pearletha Furl,  MD  cyclobenzaprine (FLEXERIL) 10 MG tablet Take 1 tablet (10 mg total) by mouth 2 (two) times daily as needed for muscle spasms. 12/05/17   Fawze, Mina A, PA-C  diazepam (VALIUM) 5 MG tablet TAKE 1 TABLET BY MOUTH EVERY 12 HOURS AS NEEDED FOR MUSCLE SPASMS 07/04/17   Sater, Pearletha Furl, MD  DULoxetine (CYMBALTA) 60 MG capsule TAKE 1 CAPSULE (60 MG TOTAL) BY MOUTH DAILY. 09/24/17   Sater, Pearletha Furl, MD  HYDROcodone-acetaminophen  (NORCO/VICODIN) 5-325 MG tablet Take 1-2 tablets by mouth every 6 (six) hours as needed for severe pain. 12/05/17   Fawze, Mina A, PA-C  modafinil (PROVIGIL) 200 MG tablet TAKE 1 TABLET BY MOUTH EVERY MORNING AND 1 AT NOON 08/09/17   Sater, Pearletha Furl, MD  ocrelizumab 600 mg in sodium chloride 0.9 % 500 mL Inject 600 mg into the vein every 6 (six) months.    [provider]  omeprazole (PRILOSEC) 20 MG capsule Take 40 mg by mouth 2 (two) times daily.    [provider]  ondansetron (ZOFRAN) 4 MG tablet Take 1 tablet (4 mg total) by mouth every 8 (eight) hours as needed for nausea or vomiting. 08/04/17   Maczis, Elmer Sow, PA-C  protein supplement shake (PREMIER PROTEIN) LIQD Take 325 mLs (11 oz total) by mouth 3 (three) times daily between meals. 02/04/17   Narda Bonds, MD  valsartan (DIOVAN) 80 MG tablet Take 80 mg by mouth daily.    [provider]    Family History Family History  Problem Relation Age of Onset  . Cancer Mother   . Stroke Mother   . Cancer Father     Social History Social History   Tobacco Use  . Smoking status: Former Games developer  . Smokeless tobacco: Never Used  Substance Use Topics  . Alcohol use: Yes    Alcohol/week: 0.0 oz    Comment: Sts. she drinks beer, liquor daily, varying amts/fim  . Drug use: No     Allergies   Ibuprofen   Review of Systems Review of Systems  Gastrointestinal: Positive for abdominal pain and vomiting.  All other systems reviewed and are negative.    Physical Exam Updated Vital Signs BP (!) 139/97   Pulse 80   Temp 98.3 F (36.8 C) (Oral)   Resp 17   Ht 5\' 5"  (1.651 m)   Wt 58.1 kg (128 lb)   SpO2 94%   BMI 21.30 kg/m   Physical Exam  Constitutional:  Uncomfortable, vomiting   HENT:  Head: Normocephalic.  MM slightly dry   Eyes: Pupils are equal, round, and reactive to light. EOM are normal.  Cardiovascular: Normal rate and regular rhythm.  Pulmonary/Chest: Effort normal and breath  sounds normal.  Abdominal: Normal appearance and bowel sounds are normal. There is tenderness in the right upper quadrant and epigastric area.  + epigastric and mild RUQ tenderness, no rebound   Neurological: She is alert.  Skin: Skin is warm. Capillary refill takes less than 2 seconds.  Psychiatric: She has a normal mood and affect.  Nursing note and vitals reviewed.    ED Treatments / Results  Labs (all labs ordered are listed, but only abnormal results are displayed) Labs Reviewed  CBC WITH DIFFERENTIAL/PLATELET - Abnormal; Notable for the following components:      Result Value   HCT 35.7 (*)    MCHC 36.7 (*)    RDW 11.3 (*)    All other components within normal limits  COMPREHENSIVE  METABOLIC PANEL - Abnormal; Notable for the following components:   Sodium 130 (*)    Potassium 3.3 (*)    Chloride 90 (*)    Creatinine, Ser 0.41 (*)    Alkaline Phosphatase 133 (*)    All other components within normal limits  LIPASE, BLOOD - Abnormal; Notable for the following components:   Lipase 74 (*)    All other components within normal limits  URINALYSIS, ROUTINE W REFLEX MICROSCOPIC - Abnormal; Notable for the following components:   Specific Gravity, Urine <1.005 (*)    Leukocytes, UA SMALL (*)    All other components within normal limits  URINALYSIS, MICROSCOPIC (REFLEX) - Abnormal; Notable for the following components:   Bacteria, UA MANY (*)    All other components within normal limits  TROPONIN I    EKG EKG Interpretation  Date/Time:  Saturday December 14 2017 07:21:26 EDT Ventricular Rate:  83 PR Interval:    QRS Duration: 113 QT Interval:  444 QTC Calculation: 522 R Axis:   -14 Text Interpretation:  Sinus rhythm Borderline intraventricular conduction delay Minimal ST elevation, inferior leads No significant change since last tracing Confirmed by Richardean Canal [phone]) on 12/14/2017 7:26:45 AM   Radiology Dg Chest 2 View  Result Date: 12/14/2017 CLINICAL DATA:   Epigastric pain with nausea and vomiting. EXAM: CHEST - 2 VIEW COMPARISON:  Chest x-ray dated February 28, 2017. FINDINGS: The heart size and mediastinal contours are within normal limits. Normal pulmonary vascularity. No focal consolidation, pleural effusion, or pneumothorax. No acute osseous abnormality. IMPRESSION: No active cardiopulmonary disease. Electronically Signed   By: Obie Dredge M.D.   On: 12/14/2017 08:37   Ct Abdomen Pelvis W Contrast  Result Date: 12/14/2017 CLINICAL DATA:  Epigastric pain for 1 day.  Nausea and vomiting. EXAM: CT ABDOMEN AND PELVIS WITH CONTRAST TECHNIQUE: Multidetector CT imaging of the abdomen and pelvis was performed using the standard protocol following bolus administration of intravenous contrast. CONTRAST:  ISOVUE-300 IOPAMIDOL (ISOVUE-300) INJECTION 61% COMPARISON:  01/30/2017. FINDINGS: Lower chest: Distal esophageal wall thickening. No pleural effusion or consolidation. Gastric sleeve. Lingular atelectasis/scarring is improved. Hepatobiliary: Cholecystectomy.  Focal fatty infiltration. Pancreas: Normal. Spleen: Normal. Adrenals/Urinary Tract: Adrenal glands are unremarkable. Kidneys are normal, without renal calculi, focal lesion, or hydronephrosis. Bladder is moderately distended, but otherwise unremarkable. Stomach/Bowel: Gastric sleeve. Hiatal hernia. Sigmoid diverticulosis. No appendiceal inflammation. Moderate stool burden. Vascular/Lymphatic: No significant vascular findings are present. No enlarged abdominal or pelvic lymph nodes. Reproductive: Approximate 3 cm RIGHT ovarian cyst is stable. Other: No free fluid or free air.  Fat containing umbilical hernia. Musculoskeletal: Spondylosis. Surgical clips LEFT groin stable. Old pelvic fractures. IMPRESSION: No bowel obstruction or air. Status post gastric sleeve with distal esophageal thickening, and hiatal hernia, stable from priors. Status post cholecystectomy. Electronically Signed   By: Elsie Stain M.D.    On: 12/14/2017 08:42    Procedures Procedures (including critical care time)  Medications Ordered in ED Medications  sodium chloride 0.9 % bolus 1,000 mL (0 mLs Intravenous Stopped 12/14/17 0846)  ondansetron (ZOFRAN) injection 4 mg (4 mg Intravenous Given 12/14/17 0713)  pantoprazole (PROTONIX) EC tablet 40 mg (40 mg Oral Given 12/14/17 0804)  famotidine (PEPCID) IVPB 20 mg premix (0 mg Intravenous Stopped 12/14/17 0804)  morphine 4 MG/ML injection 4 mg (4 mg Intravenous Given 12/14/17 0713)  iopamidol (ISOVUE-300) 61 % injection 100 mL (100 mLs Intravenous Contrast Given 12/14/17 0743)  ciprofloxacin (CIPRO) tablet 500 mg (500 mg Oral Given 12/14/17  6789)  potassium chloride 20 MEQ/15ML (10%) solution 40 mEq (40 mEq Oral Given 12/14/17 0900)     Initial Impression / Assessment and Plan / ED Course  I have reviewed the triage vital signs and the nursing notes.  Pertinent labs & imaging results that were available during my care of the patient were reviewed by me and considered in my medical decision making (see chart for details).     Catherine Hubbard is a 57 y.o. female here with epigastric pain and RUQ pain. Had previous gastric sleeve and cholecystectomy. Consider gastro vs gastric sleeve dysfunction vs partial SBO vs duodenitis vs pancreatitis. Will get labs, CT ab/pel. Will give IVF, PPI, zofran.   9:01 AM Labs showed K 3.3, supplemented. Lipase 74. CT showed no pancreatitis or SBO. There is mild esophageal thickening. She has hx of esophagitis in the past and esophageal strictures. She is able to tolerate PO and follows up with [facility]. Will increase prilosec to 40 mg daily, add pepcid prn. UA + UTI so will give cipro. Will have her call her GI doctor for repeat endoscopy.    Final Clinical Impressions(s) / ED Diagnoses   Final diagnoses:  None    ED Discharge Orders    None       Charlynne Pander, MD 12/14/17 [phone]

## 2017-12-14 NOTE — ED Triage Notes (Signed)
Pt reports intermittent upper abd pain with vomiting pt. States vomiting reliefs pain. Pt reports emesis is dark brown in color.

## 2017-12-14 NOTE — Discharge Instructions (Signed)
Your esophagus is slightly inflamed.  Increase prilosec to 40 mg daily.   Take pepcid twice daily as needed.   Take zofran for nausea.   You have mild urinary tract infection so take cipro 500 mg twice daily for 5 days.   Call your GI doctor for repeat endoscopy   Return to ER if you have severe abdominal pain, vomiting, fever, dehydration, severe chest pain

## 2017-12-23 ENCOUNTER — Telehealth: Payer: Self-pay | Admitting: Neurology

## 2017-12-23 DIAGNOSIS — G35 Multiple sclerosis: Secondary | ICD-10-CM | POA: Diagnosis not present

## 2017-12-23 MED ORDER — LOSARTAN POTASSIUM 50 MG PO TABS: 50.0000 mg | ORAL_TABLET | Freq: Every day | ORAL | 2 refills | Status: DC

## 2017-12-23 NOTE — Telephone Encounter (Signed)
She is here today for her Ocrevus infusion.  Blood pressure is elevated at 165/110.  She had been on valsartan 80 mg daily but it was recalled and she never got an alternative medication.  I will call in losartan 50 mg.   I did advise her to try to find a primary care physician to better manage her hypertension.  We will give her clonidine before the Ocrevus infusion.

## 2018-01-20 ENCOUNTER — Ambulatory Visit: Payer: Federal, State, Local not specified - PPO | Admitting: Neurology

## 2018-01-20 ENCOUNTER — Encounter: Payer: Self-pay | Admitting: Neurology

## 2018-01-20 ENCOUNTER — Other Ambulatory Visit: Payer: Self-pay

## 2018-01-20 VITALS — BP 142/92 | HR 91 | Resp 16 | Ht 65.0 in | Wt 140.0 lb

## 2018-01-20 DIAGNOSIS — G35 Multiple sclerosis: Secondary | ICD-10-CM | POA: Diagnosis not present

## 2018-01-20 DIAGNOSIS — M549 Dorsalgia, unspecified: Secondary | ICD-10-CM

## 2018-01-20 DIAGNOSIS — M542 Cervicalgia: Secondary | ICD-10-CM | POA: Diagnosis not present

## 2018-01-20 DIAGNOSIS — R260 Ataxic gait: Secondary | ICD-10-CM

## 2018-01-20 DIAGNOSIS — Z79899 Other long term (current) drug therapy: Secondary | ICD-10-CM

## 2018-01-20 DIAGNOSIS — N3090 Cystitis, unspecified without hematuria: Secondary | ICD-10-CM

## 2018-01-20 DIAGNOSIS — R200 Anesthesia of skin: Secondary | ICD-10-CM

## 2018-01-20 MED ORDER — MODAFINIL 200 MG PO TABS: 200.0000 mg | ORAL_TABLET | Freq: Every day | ORAL | 5 refills | Status: DC

## 2018-01-20 MED ORDER — DULOXETINE HCL 60 MG PO CPEP: 60.0000 mg | ORAL_CAPSULE | Freq: Every day | ORAL | 3 refills | Status: DC

## 2018-01-20 MED ORDER — ETODOLAC 400 MG PO TABS: 400.0000 mg | ORAL_TABLET | Freq: Two times a day (BID) | ORAL | 2 refills | Status: DC | PRN

## 2018-01-20 MED ORDER — BUPROPION HCL ER (XL) 300 MG PO TB24: 300.0000 mg | ORAL_TABLET | Freq: Every day | ORAL | 1 refills | Status: DC

## 2018-01-20 NOTE — Progress Notes (Signed)
[facility]  PATIENT: Catherine Hubbard DOB: 12/05/1960   _________________________________   HISTORICAL  CHIEF COMPLAINT:  Chief Complaint  Patient presents with  . Multiple Sclerosis    Last Ocrevus infusion was 12/23/17.  Sts. she has had more trouble with falls in the last few months, but thinks this is due to inattention (for ex.--she wasn't paying attention and tripped over a trash can).  Has been seen in the ER after a couple of falls. Sts. she is having more trouble with  urinary incontinence without warning.  Thinks she has a UTI--c/o strong odor to urine/fim  . Nicotine Dependence    HISTORY OF PRESENT ILLNESS:  Catherine Hubbard is a 57 y.o. woman with MS.    Update 01/20/2018: She has been on ocrelizumab and just had her second dose of the medication.   She has tolerated it well and has not had any exacerbations.    She feels stable for the most part but has had more falls, usually due to stumbling or tripping.   Gait does not seem that different to her.    She denies any change in strength or sensation.  She is noting urinary frequency with some burning and is concerned about a urinary tract infection.  She has had these in the past.  She gets LBP helped by muscle relaxants.  The pain is midline and radiates to the proximal legs.  She also has pain in her shoulders and arms.   She notes spasms in hr neck.       She has esophagitis and is supposed to have a stretch procedure soon.      She also has a h/o gastric bypass  Update 07/22/2017:   She had her first dose of ocrelizumab in November. She tolerated it well and there were no complications. Her next infusion will be at the end of April. For the most part, she feels the MS has been stable. However, she did note some word finding difficulties last week. This is better this week.    She felt mildly stronger since the infusion.   She feels off balanced, especially if she turns fast or someone bumps into her.    She needs to hold on in the shower.  Her right leg is weaker than her left and sometimes has a foot drop, esp if walking longer distances.     She is doing Geophysicist/field seismologist yoga.   She notes spasticity in legs at night, helped by valium.    Late last month,  she noted a few days where she had trouble coming up with the right word.   She feels she is doing better than last week ans almost back to baseline.    Her bladder is about the same.     Vision is mostly stable.   She lost her prescription glasses and uses readers to read.    She notes pain, mostly in the mid back.   Yoga has not helped the pain much.    She notes sometimes bruising more easily, especially after scratching.    Mood is doing ok, helped by duloxetine and Wellbutrin.   Drinking is better.       From 10/09/2016:  MS:   She is on Rebif and tolerates it well.    However, her last injection was 08/17/2016 and she has not yet gotten refill.  She denies any definite exacerbations but she feels balance, footdrop and writing are worse.   Gait/strength/sensation:  She notes a right foot drop and over last year the left leg is also bothering her more.   She reports poor balance .   She does not need a cane.  She has spasticity in both legs, worse on the right and she sometimes gets spasms that are worse at night.      Sometimes she feels like she is being pushed to the right as she walks.   She has tingling in her hands and face.  Baclofen has helped spasticity some.   Cyclobenzaprine has not helped spasms.     Bladder:   She reports urinary frequency and occasional incontinence.   She feels bladder function is worse with recent bladder infection.   stable.,  She was placed on Cephalexin and then switched to doxycycline.   She felt she did better at first on cephalexin but then did worse again.   Cx and Sensitivity shows E. Coli resistance to cephalosporines (also to Bactrim and Cipro) and she is sensitive to tetracycline and nitrofurantoin  and gentamicin and meropenem.       Vision:swallow:   She feels this is stable.   She has diplopia laying on either side but not when sitting up.   Also some she reads a while or when very tired.     Dysphagia:   She notes dysphagia, especially with 'sticky' pills.   With EGD, she had some capsules stucjk in esophagus and some med's were discontinued.    Strictures are being stretched.    Mood/EtOH/Cognition:   Cymbalta was stopped and she was placed on Wellbutrin and Prozac but is doing worse.   The switch was made due to difficulty swallowing.    She still sometimes feels moderately depressed.     She has some apathy.   She drinks less alcohol than last year   She has mild difficulty with cognition, mostly occasional difficulty with short-term memory and verbal fluency (often gets syllables reversed).  She has trouble completing complicated tasks.  .  Fatigue:   She has fatigue and takes Provigil but it was stopped.  She was tried on Ritalin or Adderall once but she did not like the way that she felt when she took it.    Sleep:   She is sleeping poorly due to more pain.   She has some RLS but has not taken anything in maonths for it.   She notes difficulty falling asleep more than with sleep maintenance.   MS History:   She was diagnosed with multiple sclerosis in 2009. Before that time, she had noted some gait ataxia but had not had any imaging studies. In 2009, due to progressive hearing loss, she had an MRI of the brain and  then had a lumbar puncture that also confirmed the diagnosis.  I have some of her early MRI reports there are several foci noted in the cervical and thoracic spinal cord in the brain, there were multiple hyperintense foci, mostly in the periventricular deep white matter many of these were contrast enhancing on 07/26/2008. There was another enhancing lesion adjacent to C2 in the spinal cord.  Initially, she was placed on Avonex. She did not feel good and she stopped. She then  tried Copaxone but also stopped after a while. She moved here in 2014 and saw Dr. Renne Crigler at [facility]. She was placed on Rebif. She tolerates Rebif well.      MRI reports from 12/04/2012  showed multiple foci in the spinal cord at  C2, C3-C4, and C4-C5. There were also foci at T4, T7, T9 and T10. The MRI of the brain showed 2 nonenhancing foci not present from studies in 2011. She reports having an MRI at [facility] 2015 but we do not have those films.   REVIEW OF SYSTEMS:  Constitutional: No fevers, chills, sweats, or change in appetite.    She reports fatigue and insomnia Eyes: see above.  No eye pain  But some light sensitivity. Ear, nose and throat: No hearing loss, ear pain, nasal congestion, sore throat.   tinnitus Cardiovascular: No chest pain, palpitations Respiratory: No shortness of breath at rest or with exertion.   Some wheezes, once on asthma med's Gastrointestinal: No nausea, vomiting, diarrhea, abdominal pain, fecal incontinence.  She has esophagitis Genitourinary: see above Musculoskeletal: see above  She also notes neck pain Integumentary: No rash, pruritus, skin lesions Neurological: as above Psychiatric: as above Endocrine: No palpitations, diaphoresis, change in appetite, change in weigh or increased thirst.  Often feels cold Hematologic/Lymphatic: No anemia, purpura, petechiae. Allergic/Immunologic: No itchy/runny eyes, nasal congestion, recent allergic reactions, rashes  ALLERGIES: Allergies  Allergen Reactions  . Ibuprofen Other (See Comments)    Cannot take due to gastric bypass    HOME MEDICATIONS:  Current Outpatient Medications:  .  albuterol (PROVENTIL HFA;VENTOLIN HFA) 108 (90 Base) MCG/ACT inhaler, Inhale 2 puffs into the lungs every 6 (six) hours as needed for wheezing or shortness of breath., Disp: , Rfl:  .  buPROPion (WELLBUTRIN XL) 300 MG 24 hr tablet, Take 1 tablet (300 mg total) by mouth daily., Disp: 90 tablet, Rfl: 1 .  cyclobenzaprine  (FLEXERIL) 10 MG tablet, Take 1 tablet (10 mg total) by mouth 2 (two) times daily as needed for muscle spasms., Disp: 10 tablet, Rfl: 0 .  DULoxetine (CYMBALTA) 60 MG capsule, Take 1 capsule (60 mg total) by mouth daily., Disp: 90 capsule, Rfl: 3 .  losartan (COZAAR) 50 MG tablet, Take 1 tablet (50 mg total) by mouth daily., Disp: 30 tablet, Rfl: 2 .  modafinil (PROVIGIL) 200 MG tablet, Take 1 tablet (200 mg total) by mouth daily., Disp: 60 tablet, Rfl: 5 .  Multiple Vitamin (MULTIVITAMIN) tablet, Take 1 tablet by mouth daily., Disp: , Rfl:  .  ocrelizumab 600 mg in sodium chloride 0.9 % 500 mL, Inject 600 mg into the vein every 6 (six) months., Disp: , Rfl:  .  omeprazole (PRILOSEC) 20 MG capsule, Take 40 mg by mouth 2 (two) times daily., Disp: , Rfl:  .  protein supplement shake (PREMIER PROTEIN) LIQD, Take 325 mLs (11 oz total) by mouth 3 (three) times daily between meals., Disp: 30 Can, Rfl: 0 .  diazepam (VALIUM) 5 MG tablet, TAKE 1 TABLET BY MOUTH EVERY 12 HOURS AS NEEDED FOR MUSCLE SPASMS (Patient not taking: Reported on 01/20/2018), Disp: 30 tablet, Rfl: 1 .  etodolac (LODINE) 400 MG tablet, Take 1 tablet (400 mg total) by mouth 2 (two) times daily as needed., Disp: 60 tablet, Rfl: 2  PAST MEDICAL HISTORY: Past Medical History:  Diagnosis Date  . Asthma   . Esophagitis   . Headache   . Hearing loss   . Hypertension   . Multiple sclerosis (HCC)   . Vision abnormalities     PAST SURGICAL HISTORY: Past Surgical History:  Procedure Laterality Date  . ANKLE FRACTURE SURGERY Bilateral   . CARPAL TUNNEL RELEASE Bilateral   . COLONOSCOPY  11/2016   multiple  . ESOPHAGOGASTRODUODENOSCOPY  11/2016  . ESOPHAGOGASTRODUODENOSCOPY  N/A 02/01/2017   Procedure: ESOPHAGOGASTRODUODENOSCOPY (EGD);  Surgeon: Iva Boop, MD;  Location: [facility] ENDOSCOPY;  Service: Endoscopy;  Laterality: N/A;  . ESOPHAGOGASTRODUODENOSCOPY (EGD) WITH PROPOFOL N/A 02/04/2017   Procedure: ESOPHAGOGASTRODUODENOSCOPY  (EGD) WITH PROPOFOL;  Surgeon: Meryl Dare, MD;  Location: [facility] ENDOSCOPY;  Service: Endoscopy;  Laterality: N/A;  . LAPAROSCOPIC GASTRIC SLEEVE RESECTION  2011  . ULNAR NERVE REPAIR Bilateral     FAMILY HISTORY: Family History  Problem Relation Age of Onset  . Cancer Mother   . Stroke Mother   . Cancer Father     SOCIAL HISTORY:  Social History   Socioeconomic History  . Marital status: Married    Spouse name: Not on file  . Number of children: Not on file  . Years of education: Not on file  . Highest education level: Not on file  Occupational History  . Not on file  Social Needs  . Financial resource strain: Not on file  . Food insecurity:    Worry: Not on file    Inability: Not on file  . Transportation needs:    Medical: Not on file    Non-medical: Not on file  Tobacco Use  . Smoking status: Former Games developer  . Smokeless tobacco: Never Used  Substance and Sexual Activity  . Alcohol use: Yes    Alcohol/week: 0.0 oz    Comment: Sts. she drinks beer, liquor daily, varying amts/fim  . Drug use: No  . Sexual activity: Never  Lifestyle  . Physical activity:    Days per week: Not on file    Minutes per session: Not on file  . Stress: Not on file  Relationships  . Social connections:    Talks on phone: Not on file    Gets together: Not on file    Attends religious service: Not on file    Active member of club or organization: Not on file    Attends meetings of clubs or organizations: Not on file    Relationship status: Not on file  . Intimate partner violence:    Fear of current or ex partner: Not on file    Emotionally abused: Not on file    Physically abused: Not on file    Forced sexual activity: Not on file  Other Topics Concern  . Not on file  Social History Narrative  . Not on file     PHYSICAL EXAM  Vitals:   01/20/18 1122  BP: (!) 142/92  Pulse: 91  Resp: 16  Weight: 140 lb (63.5 kg)  Height: 5\' 5"  (1.651 m)    Body mass index is 23.3  kg/m.   General: The patient is well-developed and well-nourished and in no acute distress.   Mild pedal edema  Neck/HEENT:  The neck is moderately severely  Tender to palpation.  Good ROM  In neck.       Musculoskeletal:   She has sacral tenderness .   Shoulders with good ROM.  Shoulders mildly ttender  Neurologic Exam  Mental status: The patient is alert and oriented x 3 at the time of the examination. The patient has apparent normal recent and remote memory, with an apparently normal attention span and concentration ability.   Speech is normal.  Cranial nerves: Extraocular movements are full.  . There is reduced right facial sensation to soft touch facial strength is normal.  The trapezius strength is normal.    She has reduced hearing on the right and Weber lateralizes right.  Marland Kitchen  Motor:  Muscle bulk is normal.   Tone is normal. Strength is  5 / 5 in all 4 extremities.   Sensory: She has normal sensation to touch and vibration in the arms and legs.  Coordination: Cerebellar testing shows good finger-nose-finger.  Gait and station: Station is normal.  The gait is slightly wide.  The tandem gait is wide.and difficult for her.   Romberg is negative..  Reflexes: Deep tendon reflexes are symmetric and mildly increased in legs bilaterally.       DIAGNOSTIC DATA (LABS, IMAGING, TESTING) - I reviewed patient records, labs, notes, testing and imaging myself where available.     ASSESSMENT AND PLAN  Multiple sclerosis (HCC) - Plan: CBC with Differential/Platelet, IgG, IgA, IgM, Urinalysis, Routine w reflex microscopic, Culture, Urine  Ataxic gait  Numbness  High risk medication use - Plan: CBC with Differential/Platelet, IgG, IgA, IgM  Cystitis - Plan: Urinalysis, Routine w reflex microscopic, Culture, Urine  Upper back pain  Neck pain   1.    Continue ocrelizumab.  We will check a CBC with differential and the IgG/IgM  2.    Continue Diazepam and other med's for  spasticity and pain.   Continue Cymbalta and bupropion for mood.    Modafinil for fatigue 3.    Trigger point injection of bilateral trapezius, rhomboid and C7-T1 paraspinal muscles with 80 mg Depo-Medrol and Marcaine.  She tolerated the procedure well and pain was better afterwards. 4.    Urinalysis and urine culture for probable urinary tract infection. 5.   She will return to see me in 4 months or sooner if she has new or worsening neurologic symptoms.  40 minutes face-to-face evaluation with greater than one half the time counseling or coordinating care about her multiple sclerosis and related symptoms.  Richard A. Epimenio Foot, MD, PhD 01/20/2018, 1:30 PM Certified in Neurology, Clinical Neurophysiology, Sleep Medicine, Pain Medicine and Neuroimaging  [facility] [address] [place], [place] [address] [phone]   .

## 2018-01-21 LAB — CBC WITH DIFFERENTIAL/PLATELET
BASOS: 1 %
Basophils Absolute: 0.1 10*3/uL (ref 0.0–0.2)
EOS (ABSOLUTE): 0.1 10*3/uL (ref 0.0–0.4)
EOS: 1 %
HEMOGLOBIN: 11.1 g/dL (ref 11.1–15.9)
Hematocrit: 33.6 % — ABNORMAL LOW (ref 34.0–46.6)
IMMATURE GRANS (ABS): 0 10*3/uL (ref 0.0–0.1)
Immature Granulocytes: 0 %
Lymphocytes Absolute: 1.6 10*3/uL (ref 0.7–3.1)
Lymphs: 33 %
MCH: 32.6 pg (ref 26.6–33.0)
MCHC: 33 g/dL (ref 31.5–35.7)
MCV: 99 fL — AB (ref 79–97)
MONOCYTES: 13 %
Monocytes Absolute: 0.6 10*3/uL (ref 0.1–0.9)
Neutrophils Absolute: 2.5 10*3/uL (ref 1.4–7.0)
Neutrophils: 52 %
Platelets: 251 10*3/uL (ref 150–450)
RBC: 3.4 x10E6/uL — ABNORMAL LOW (ref 3.77–5.28)
RDW: 12.9 % (ref 12.3–15.4)
WBC: 4.8 10*3/uL (ref 3.4–10.8)

## 2018-01-21 LAB — URINALYSIS, ROUTINE W REFLEX MICROSCOPIC

## 2018-01-21 LAB — IGG, IGA, IGM
IGG (IMMUNOGLOBIN G), SERUM: 1121 mg/dL (ref 700–1600)
IgA/Immunoglobulin A, Serum: 313 mg/dL (ref 87–352)
IgM (Immunoglobulin M), Srm: 201 mg/dL (ref 26–217)

## 2018-01-22 ENCOUNTER — Telehealth: Payer: Self-pay | Admitting: Neurology

## 2018-01-22 NOTE — Telephone Encounter (Signed)
Message printed and given to Tosha in the infusion suite/fim

## 2018-01-22 NOTE — Telephone Encounter (Signed)
Noreene Larsson with [employer] called stating they are currently out of stock for Ocrevus, stating they will call later this will to schedule delivery. FYI

## 2018-01-23 ENCOUNTER — Telehealth: Payer: Self-pay | Admitting: *Deleted

## 2018-01-23 NOTE — Telephone Encounter (Signed)
LMOM with below lab results, and to please call if she has any questions/fim

## 2018-01-23 NOTE — Telephone Encounter (Signed)
-----   Message from Asa Lente, MD sent at 01/22/2018  5:25 PM EDT ----- Please let the patient know that the lab work is fine.

## 2018-01-30 LAB — URINE CULTURE

## 2018-02-07 ENCOUNTER — Other Ambulatory Visit: Payer: Self-pay

## 2018-02-07 ENCOUNTER — Emergency Department (HOSPITAL_COMMUNITY): Payer: Federal, State, Local not specified - PPO

## 2018-02-07 ENCOUNTER — Encounter (HOSPITAL_COMMUNITY): Payer: Self-pay | Admitting: Emergency Medicine

## 2018-02-07 ENCOUNTER — Inpatient Hospital Stay (HOSPITAL_COMMUNITY)
Admission: EM | Admit: 2018-02-07 | Discharge: 2018-02-12 | DRG: 641 | Disposition: A | Discharge status: Home / Self Care | Payer: Federal, State, Local not specified - PPO | Attending: Internal Medicine | Admitting: Internal Medicine

## 2018-02-07 DIAGNOSIS — E878 Other disorders of electrolyte and fluid balance, not elsewhere classified: Secondary | ICD-10-CM | POA: Diagnosis present

## 2018-02-07 DIAGNOSIS — Z79899 Other long term (current) drug therapy: Secondary | ICD-10-CM

## 2018-02-07 DIAGNOSIS — E876 Hypokalemia: Secondary | ICD-10-CM | POA: Diagnosis not present

## 2018-02-07 DIAGNOSIS — R55 Syncope and collapse: Secondary | ICD-10-CM | POA: Diagnosis not present

## 2018-02-07 DIAGNOSIS — R197 Diarrhea, unspecified: Secondary | ICD-10-CM | POA: Diagnosis present

## 2018-02-07 DIAGNOSIS — R402413 Glasgow coma scale score 13-15, at hospital admission: Secondary | ICD-10-CM | POA: Diagnosis not present

## 2018-02-07 DIAGNOSIS — N390 Urinary tract infection, site not specified: Secondary | ICD-10-CM | POA: Diagnosis not present

## 2018-02-07 DIAGNOSIS — Z9884 Bariatric surgery status: Secondary | ICD-10-CM | POA: Diagnosis not present

## 2018-02-07 DIAGNOSIS — G35 Multiple sclerosis: Secondary | ICD-10-CM | POA: Diagnosis not present

## 2018-02-07 DIAGNOSIS — S199XXA Unspecified injury of neck, initial encounter: Secondary | ICD-10-CM | POA: Diagnosis not present

## 2018-02-07 DIAGNOSIS — I951 Orthostatic hypotension: Secondary | ICD-10-CM | POA: Diagnosis not present

## 2018-02-07 DIAGNOSIS — W19XXXA Unspecified fall, initial encounter: Secondary | ICD-10-CM | POA: Diagnosis present

## 2018-02-07 DIAGNOSIS — I10 Essential (primary) hypertension: Secondary | ICD-10-CM | POA: Diagnosis not present

## 2018-02-07 DIAGNOSIS — S299XXA Unspecified injury of thorax, initial encounter: Secondary | ICD-10-CM | POA: Diagnosis not present

## 2018-02-07 DIAGNOSIS — M542 Cervicalgia: Secondary | ICD-10-CM | POA: Diagnosis not present

## 2018-02-07 DIAGNOSIS — E871 Hypo-osmolality and hyponatremia: Secondary | ICD-10-CM | POA: Diagnosis present

## 2018-02-07 DIAGNOSIS — J45909 Unspecified asthma, uncomplicated: Secondary | ICD-10-CM | POA: Diagnosis present

## 2018-02-07 DIAGNOSIS — Z886 Allergy status to analgesic agent status: Secondary | ICD-10-CM

## 2018-02-07 DIAGNOSIS — Y9269 Other specified industrial and construction area as the place of occurrence of the external cause: Secondary | ICD-10-CM

## 2018-02-07 DIAGNOSIS — R0902 Hypoxemia: Secondary | ICD-10-CM | POA: Diagnosis not present

## 2018-02-07 DIAGNOSIS — S0003XA Contusion of scalp, initial encounter: Secondary | ICD-10-CM | POA: Diagnosis present

## 2018-02-07 DIAGNOSIS — S0990XA Unspecified injury of head, initial encounter: Secondary | ICD-10-CM | POA: Diagnosis not present

## 2018-02-07 DIAGNOSIS — H9190 Unspecified hearing loss, unspecified ear: Secondary | ICD-10-CM | POA: Diagnosis not present

## 2018-02-07 DIAGNOSIS — F329 Major depressive disorder, single episode, unspecified: Secondary | ICD-10-CM | POA: Diagnosis not present

## 2018-02-07 DIAGNOSIS — G35A Relapsing-remitting multiple sclerosis: Secondary | ICD-10-CM | POA: Diagnosis present

## 2018-02-07 DIAGNOSIS — E162 Hypoglycemia, unspecified: Secondary | ICD-10-CM | POA: Diagnosis not present

## 2018-02-07 DIAGNOSIS — Z87891 Personal history of nicotine dependence: Secondary | ICD-10-CM

## 2018-02-07 DIAGNOSIS — E860 Dehydration: Principal | ICD-10-CM | POA: Diagnosis present

## 2018-02-07 LAB — CBC WITH DIFFERENTIAL/PLATELET
ABS IMMATURE GRANULOCYTES: 0 10*3/uL (ref 0.0–0.1)
Basophils Absolute: 0.1 10*3/uL (ref 0.0–0.1)
Basophils Relative: 1 %
EOS PCT: 1 %
Eosinophils Absolute: 0 10*3/uL (ref 0.0–0.7)
HEMATOCRIT: 33.2 % — AB (ref 36.0–46.0)
HEMOGLOBIN: 11.3 g/dL — AB (ref 12.0–15.0)
Immature Granulocytes: 1 %
LYMPHS ABS: 1.4 10*3/uL (ref 0.7–4.0)
LYMPHS PCT: 18 %
MCH: 33.4 pg (ref 26.0–34.0)
MCHC: 34 g/dL (ref 30.0–36.0)
MCV: 98.2 fL (ref 78.0–100.0)
Monocytes Absolute: 0.9 10*3/uL (ref 0.1–1.0)
Monocytes Relative: 12 %
NEUTROS ABS: 5.5 10*3/uL (ref 1.7–7.7)
Neutrophils Relative %: 69 %
Platelets: 327 10*3/uL (ref 150–400)
RBC: 3.38 MIL/uL — AB (ref 3.87–5.11)
RDW: 12.5 % (ref 11.5–15.5)
WBC: 8 10*3/uL (ref 4.0–10.5)

## 2018-02-07 LAB — BASIC METABOLIC PANEL
ANION GAP: 11 (ref 5–15)
BUN: 11 mg/dL (ref 6–20)
CHLORIDE: 95 mmol/L — AB (ref 101–111)
CO2: 24 mmol/L (ref 22–32)
Calcium: 8.5 mg/dL — ABNORMAL LOW (ref 8.9–10.3)
Creatinine, Ser: 0.71 mg/dL (ref 0.44–1.00)
GFR calc Af Amer: >60 mL/min (ref >60)
GFR calc non Af Amer: >60 mL/min (ref >60)
GLUCOSE: 64 mg/dL — AB (ref 65–99)
POTASSIUM: 3.2 mmol/L — AB (ref 3.5–5.1)
Sodium: 130 mmol/L — ABNORMAL LOW (ref 135–145)

## 2018-02-07 LAB — TROPONIN I

## 2018-02-07 LAB — CBG MONITORING, ED: Glucose-Capillary: 64 mg/dL — ABNORMAL LOW (ref 65–99)

## 2018-02-07 LAB — I-STAT BETA HCG BLOOD, ED (MC, WL, AP ONLY): HCG, QUANTITATIVE: 6.9 m[IU]/mL — AB (ref <5)

## 2018-02-07 MED ORDER — SODIUM CHLORIDE 0.9 % IV SOLN
INTRAVENOUS | Status: DC
  Administered 2018-02-07: 23:00:00 via INTRAVENOUS

## 2018-02-07 MED ORDER — ONDANSETRON HCL 4 MG/2ML IJ SOLN
4.0000 mg | Freq: Once | INTRAMUSCULAR | Status: AC
  Administered 2018-02-07: 4 mg via INTRAVENOUS
  Filled 2018-02-07: qty 2

## 2018-02-07 MED ORDER — POTASSIUM CHLORIDE CRYS ER 20 MEQ PO TBCR
40.0000 meq | EXTENDED_RELEASE_TABLET | Freq: Once | ORAL | Status: AC
  Administered 2018-02-08: 40 meq via ORAL
  Filled 2018-02-07: qty 2

## 2018-02-07 MED ORDER — SODIUM CHLORIDE 0.9 % IV BOLUS
1000.0000 mL | Freq: Once | INTRAVENOUS | Status: AC
  Administered 2018-02-07: 1000 mL via INTRAVENOUS

## 2018-02-07 MED ORDER — MORPHINE SULFATE (PF) 4 MG/ML IV SOLN
4.0000 mg | Freq: Once | INTRAVENOUS | Status: AC
  Administered 2018-02-07: 4 mg via INTRAVENOUS
  Filled 2018-02-07: qty 1

## 2018-02-07 NOTE — ED Notes (Signed)
Pt is asking for pain meds for head.  Pain scale is 7 when on ice, 10 when not in contact with ice.  RN notified

## 2018-02-07 NOTE — ED Notes (Signed)
Pt. Ambulated to bathroom with steady gait.  

## 2018-02-07 NOTE — ED Provider Notes (Signed)
[facility] EMERGENCY DEPARTMENT Provider Note   CSN: [id number] Arrival date & time: 02/07/18  1951     History   Chief Complaint Chief Complaint  Patient presents with  . Near Syncope    HPI Catherine Hubbard is a 57 y.o. female.  57 year old female brought in by EMS for syncopal episode.  Patient states that she was out shopping today and stopped to look at a flower and states the next thing she remembers is lying on the ground staring up the people waving at her.  Patient denies feeling weak, dizzy, lightheaded prior to the event.  Denies chest pain or shortness of breath.  Reports pain and swelling to the back of her head with mild neck pain, notes that her neck pain his chronic nature due to her MS.  No history of prior syncopal episodes, does not take blood thinners.  Patient reports diarrhea x1 week described as loose green stools.  No other injuries or complaints or concerns.     Past Medical History:  Diagnosis Date  . Asthma   . Esophagitis   . Headache   . Hearing loss   . Hypertension   . Multiple sclerosis (HCC)   . Vision abnormalities     Patient Active Problem List   Diagnosis Date Noted  . Syncope 02/07/2018  . Neck pain 01/20/2018  . Foreign body in esophagus   . Esophageal stricture   . Intractable nausea and vomiting 01/31/2017  . Dysphagia   . Severe protein-calorie malnutrition (HCC)   . Nausea & vomiting 01/30/2017  . Hypokalemia 01/30/2017  . Acute kidney injury (HCC) 01/30/2017  . Cystitis 01/30/2017  . Costochondritis 05/11/2016  . Trochanteric bursitis of both hips 03/28/2016  . Upper back pain 03/28/2016  . Bilateral low back pain with bilateral sciatica 07/13/2015  . Right hip pain 07/13/2015  . Lumbar radicular pain 03/21/2015  . Other fatigue 01/11/2015  . Dysesthesia 01/11/2015  . Urinary urgency 01/11/2015  . Multiple sclerosis (HCC) 10/06/2014  . Numbness 10/06/2014  . Ataxic gait 10/06/2014  . High  risk medication use 10/06/2014  . Gastric bypass status for obesity 10/06/2014  . Cognitive changes 10/06/2014  . Depression with anxiety 10/06/2014  . Restless leg 10/06/2014  . Insomnia 10/06/2014  . Essential hypertension 10/06/2014  . Difficulty hearing 01/13/2014  . Other muscle spasm 01/13/2014    Past Surgical History:  Procedure Laterality Date  . ANKLE FRACTURE SURGERY Bilateral   . CARPAL TUNNEL RELEASE Bilateral   . COLONOSCOPY  11/2016   multiple  . ESOPHAGOGASTRODUODENOSCOPY  11/2016  . ESOPHAGOGASTRODUODENOSCOPY N/A 02/01/2017   Procedure: ESOPHAGOGASTRODUODENOSCOPY (EGD);  Surgeon: Iva Boop, MD;  Location[facility] ENDOSCOPY;  Service: Endoscopy;  Laterality: N/A;  . ESOPHAGOGASTRODUODENOSCOPY (EGD) WITH PROPOFOL N/A 02/04/2017   Procedure: ESOPHAGOGASTRODUODENOSCOPY (EGD) WITH PROPOFOL;  Surgeon: Meryl Dare, MD;  Location: [facility] ENDOSCOPY;  Service: Endoscopy;  Laterality: N/A;  . LAPAROSCOPIC GASTRIC SLEEVE RESECTION  2011  . ULNAR NERVE REPAIR Bilateral      OB History   None      Home Medications    Prior to Admission medications   Medication Sig Start Date End Date Taking? Authorizing Provider  albuterol (PROVENTIL HFA;VENTOLIN HFA) 108 (90 Base) MCG/ACT inhaler Inhale 2 puffs into the lungs every 6 (six) hours as needed for wheezing or shortness of breath.   Yes [provider]  buPROPion (WELLBUTRIN XL) 300 MG 24 hr tablet Take 1 tablet (300 mg total) by  mouth daily. 01/20/18  Yes Sater, Pearletha Furl, MD  cyclobenzaprine (FLEXERIL) 10 MG tablet Take 1 tablet (10 mg total) by mouth 2 (two) times daily as needed for muscle spasms. 12/05/17  Yes Fawze, Mina A, PA-C  DULoxetine (CYMBALTA) 60 MG capsule Take 1 capsule (60 mg total) by mouth daily. 01/20/18  Yes Sater, Pearletha Furl, MD  etodolac (LODINE) 400 MG tablet Take 1 tablet (400 mg total) by mouth 2 (two) times daily as needed. Patient taking differently: Take 400 mg by mouth daily as needed (pain).   01/20/18  Yes Sater, Pearletha Furl, MD  HYDROcodone-acetaminophen (NORCO/VICODIN) 5-325 MG tablet Take 0.5 tablets by mouth every 6 (six) hours as needed (pain).   Yes [provider]  losartan (COZAAR) 50 MG tablet Take 1 tablet (50 mg total) by mouth daily. 12/23/17  Yes Sater, Pearletha Furl, MD  modafinil (PROVIGIL) 200 MG tablet Take 1 tablet (200 mg total) by mouth daily. Patient taking differently: Take 100-200 mg by mouth See admin instructions. Take one tablet (200 mg) by mouth in the morning or 1/2 tablet (100 mg) in the afternoon - as needed to focus 01/20/18  Yes Sater, Pearletha Furl, MD  Multiple Vitamin (MULTIVITAMIN WITH MINERALS) TABS tablet Take 2 tablets by mouth daily.   Yes [provider]  Multiple Vitamins-Minerals (HAIR/SKIN/NAILS/BIOTIN) TABS Take 2 tablets by mouth daily.   Yes [provider]  ocrelizumab 600 mg in sodium chloride 0.9 % 500 mL Inject 600 mg into the vein every 6 (six) months. Last infusion May 2019 - due in December 2019   Yes [provider]  omeprazole (PRILOSEC) 20 MG capsule Take 40 mg by mouth 2 (two) times daily.   Yes [provider]  protein supplement shake (PREMIER PROTEIN) LIQD Take 325 mLs (11 oz total) by mouth 3 (three) times daily between meals. 02/04/17  Yes Narda Bonds, MD  venlafaxine XR (EFFEXOR-XR) 75 MG 24 hr capsule Take 75 mg by mouth daily with breakfast. 01/24/18  Yes [provider]  diazepam (VALIUM) 5 MG tablet TAKE 1 TABLET BY MOUTH EVERY 12 HOURS AS NEEDED FOR MUSCLE SPASMS Patient not taking: Reported on 01/20/2018 07/04/17   Asa Lente, MD    Family History Family History  Problem Relation Age of Onset  . Cancer Mother   . Stroke Mother   . Cancer Father     Social History Social History   Tobacco Use  . Smoking status: Former Games developer  . Smokeless tobacco: Never Used  Substance Use Topics  . Alcohol use: Yes    Alcohol/week: 0.0 oz    Comment: Sts. she drinks beer, liquor  daily, varying amts/fim  . Drug use: No     Allergies   Ibuprofen   Review of Systems Review of Systems  Constitutional: Negative for chills, diaphoresis and fever.  Eyes: Negative for visual disturbance.  Respiratory: Negative for shortness of breath.   Cardiovascular: Negative for chest pain.  Gastrointestinal: Positive for diarrhea. Negative for abdominal pain, blood in stool, nausea and vomiting.  Genitourinary: Negative for dysuria, frequency and urgency.  Musculoskeletal: Positive for neck pain. Negative for gait problem and joint swelling.  Skin: Negative for rash and wound.  Allergic/Immunologic: Positive for immunocompromised state.  Neurological: Positive for headaches. Negative for dizziness, speech difficulty, weakness and light-headedness.  Hematological: Does not bruise/bleed easily.  Psychiatric/Behavioral: Negative for confusion.  All other systems reviewed and are negative.    Physical Exam Updated Vital Signs BP Marland Kitchen)  141/90   Pulse (!) 102   Temp 98 F (36.7 C) (Oral)   Resp (!) 30   SpO2 100%   Physical Exam  Constitutional: She is oriented to person, place, and time. She appears well-developed and well-nourished.  HENT:  Head: Normocephalic.    Right Ear: External ear normal.  Left Ear: External ear normal.  Mouth/Throat: Oropharynx is clear and moist.  Eyes: Pupils are equal, round, and reactive to light. Conjunctivae and EOM are normal.  Neck: No JVD present.  Cardiovascular: Normal rate, regular rhythm, normal heart sounds and intact distal pulses.  No murmur heard. Pulmonary/Chest: Effort normal and breath sounds normal. No respiratory distress.  Abdominal: Soft. She exhibits no distension. There is no tenderness.  Musculoskeletal:  Vague posterior neck pain, no crepitus or step offs   Neurological: She is alert and oriented to person, place, and time. She has normal strength. No cranial nerve deficit or sensory deficit. GCS eye subscore is  4. GCS verbal subscore is 5. GCS motor subscore is 6.  Skin: Skin is warm and dry. No rash noted.  Psychiatric: She has a normal mood and affect. Her behavior is normal.  Nursing note and vitals reviewed.    ED Treatments / Results  Labs (all labs ordered are listed, but only abnormal results are displayed) Labs Reviewed  BASIC METABOLIC PANEL - Abnormal; Notable for the following components:      Result Value   Sodium 130 (*)    Potassium 3.2 (*)    Chloride 95 (*)    Glucose, Bld 64 (*)    Calcium 8.5 (*)    All other components within normal limits  CBC WITH DIFFERENTIAL/PLATELET - Abnormal; Notable for the following components:   RBC 3.38 (*)    Hemoglobin 11.3 (*)    HCT 33.2 (*)    All other components within normal limits  CBG MONITORING, ED - Abnormal; Notable for the following components:   Glucose-Capillary 64 (*)    All other components within normal limits  I-STAT BETA HCG BLOOD, ED ([facility], [facility], [facility] ONLY) - Abnormal; Notable for the following components:   I-stat hCG, quantitative 6.9 (*)    All other components within normal limits  TROPONIN I  URINALYSIS, ROUTINE W REFLEX MICROSCOPIC    EKG EKG Interpretation  Date/Time:  Friday February 07 2018 20:03:39 EDT Ventricular Rate:  86 PR Interval:    QRS Duration: 100 QT Interval:  403 QTC Calculation: 482 R Axis:   -23 Text Interpretation:  Sinus rhythm Ventricular premature complex Probable left ventricular hypertrophy Anterior Q waves, possibly due to LVH since last tracing no significant change Confirmed by Eber Hong ([id number]) on 02/07/2018 8:23:42 PM   Radiology Ct Head Wo Contrast  Result Date: 02/07/2018 CLINICAL DATA:  Pt BIB [facility] after a near syncopal episode and fall tonight. Pt can recall the whole incident, was leaving a restaurant when she felt dizzy, pt denies LOC, did hit her head EXAM: CT HEAD WITHOUT CONTRAST CT CERVICAL SPINE WITHOUT CONTRAST TECHNIQUE: Multidetector CT imaging of the head and  cervical spine was performed following the standard protocol without intravenous contrast. Multiplanar CT image reconstructions of the cervical spine were also generated. COMPARISON:  08/04/2017 FINDINGS: CT HEAD FINDINGS Brain: No evidence of acute infarction, hemorrhage, hydrocephalus, extra-axial collection or mass lesion/mass effect. There is ventricular and sulcal enlargement reflecting atrophy advanced for age. Patchy white matter hypoattenuation is noted bilaterally consistent with mild to moderate chronic microvascular ischemic change. Vascular: No  hyperdense vessel or unexpected calcification. Skull: Normal. Negative for fracture or focal lesion. Sinuses/Orbits: Globes and orbits are unremarkable. Sinuses and mastoid air cells are clear. Other: None. CT CERVICAL SPINE FINDINGS Alignment: Normal. Skull base and vertebrae: No acute fracture. No primary bone lesion or focal pathologic process. Soft tissues and spinal canal: No prevertebral fluid or swelling. No visible canal hematoma. Disc levels: Moderate loss of disc height at C5-C6, C6-C7 and C7-T1. Mild spondylotic disc bulging and endplate spurring at these levels. No convincing disc herniation. Facet degenerative changes are noted, greater on the right. Upper chest: No acute findings. No masses or enlarged lymph nodes. Clear lung apices. Other: None. IMPRESSION: HEAD CT 1. No acute intracranial abnormalities. 2. Atrophy advanced for age. Mild to moderate chronic microvascular ischemic change. CERVICAL CT 1. No fracture or acute finding. Electronically Signed   By: Amie Portland M.D.   On: 02/07/2018 20:57   Ct Cervical Spine Wo Contrast  Result Date: 02/07/2018 CLINICAL DATA:  Pt BIB [facility] after a near syncopal episode and fall tonight. Pt can recall the whole incident, was leaving a restaurant when she felt dizzy, pt denies LOC, did hit her head EXAM: CT HEAD WITHOUT CONTRAST CT CERVICAL SPINE WITHOUT CONTRAST TECHNIQUE: Multidetector CT imaging of  the head and cervical spine was performed following the standard protocol without intravenous contrast. Multiplanar CT image reconstructions of the cervical spine were also generated. COMPARISON:  08/04/2017 FINDINGS: CT HEAD FINDINGS Brain: No evidence of acute infarction, hemorrhage, hydrocephalus, extra-axial collection or mass lesion/mass effect. There is ventricular and sulcal enlargement reflecting atrophy advanced for age. Patchy white matter hypoattenuation is noted bilaterally consistent with mild to moderate chronic microvascular ischemic change. Vascular: No hyperdense vessel or unexpected calcification. Skull: Normal. Negative for fracture or focal lesion. Sinuses/Orbits: Globes and orbits are unremarkable. Sinuses and mastoid air cells are clear. Other: None. CT CERVICAL SPINE FINDINGS Alignment: Normal. Skull base and vertebrae: No acute fracture. No primary bone lesion or focal pathologic process. Soft tissues and spinal canal: No prevertebral fluid or swelling. No visible canal hematoma. Disc levels: Moderate loss of disc height at C5-C6, C6-C7 and C7-T1. Mild spondylotic disc bulging and endplate spurring at these levels. No convincing disc herniation. Facet degenerative changes are noted, greater on the right. Upper chest: No acute findings. No masses or enlarged lymph nodes. Clear lung apices. Other: None. IMPRESSION: HEAD CT 1. No acute intracranial abnormalities. 2. Atrophy advanced for age. Mild to moderate chronic microvascular ischemic change. CERVICAL CT 1. No fracture or acute finding. Electronically Signed   By: Amie Portland M.D.   On: 02/07/2018 20:57   Dg Chest Port 1 View  Result Date: 02/07/2018 CLINICAL DATA:  Pt BIB [facility] after a near syncopal episode and fall tonight. Pt can recall the whole incident, was leaving a restaurant when she felt dizzy, pt denies LOC, did hit her head. Pt reports that she fell on Tuesday, hitting her head then as well. AANDO x 4 EXAM: PORTABLE CHEST 1  VIEW COMPARISON:  12/14/2017 FINDINGS: Cardiac silhouette is normal in size. No mediastinal or hilar masses. No evidence of adenopathy. Clear lungs.  No pleural effusion or pneumothorax. Bilateral old healed rib fractures. Skeletal structures are diffusely demineralized. IMPRESSION: No acute cardiopulmonary disease. Electronically Signed   By: Amie Portland M.D.   On: 02/07/2018 20:51    Procedures Procedures (including critical care time)  Medications Ordered in ED Medications  sodium chloride 0.9 % bolus 1,000 mL (0 mLs Intravenous Stopped  02/07/18 2207)    And  0.9 %  sodium chloride infusion ( Intravenous New Bag/Given 02/07/18 2325)  morphine 4 MG/ML injection 4 mg (4 mg Intravenous Given 02/07/18 2146)  ondansetron (ZOFRAN) injection 4 mg (4 mg Intravenous Given 02/07/18 2146)     Initial Impression / Assessment and Plan / ED Course  I have reviewed the triage vital signs and the nursing notes.  Pertinent labs & imaging results that were available during my care of the patient were reviewed by me and considered in my medical decision making (see chart for details).  Clinical Course as of Feb 07 2342  Fri Feb 07, 2018  2321 57 yo female brought in by EMS for syncope. Patient states she was shopping today and stopped to look at a flower and then woke up lying on the floor. Patient was not assisted to the ground, fell and struck the back of her head on the hard flooring. Patient's husband is here with her, he did not witness the event but states it occurred around 6:45PM today. Denies any symptoms leading up to the syncopal event, no prior history of syncope. Complains of a headache and slight neck pain, no other complaints or concerns. Review of labs, patient has a mild hypokalemia at 3.2, hypoglycemia with glucose of 64 (plan is to repeat her glucose level and give her juice if still low). UA pending. CT head and c-spine negative for traumatic injury. Troponin negative. Mild QT prolongation  on EKG. [place] Syncope Risk score of 4 (QTC >480 and cardiac syncope), high risk. Discussed with Dr. Hyacinth Meeker, ER attending. Dr. Hyacinth Meeker has seen the patient and recommends consult to hospitalist for further evaluation/monitoring. Discussed plan of care with patient, agreeable to stay, no current PCP (her [facility] PCP retired and she has not seen a new PCP).    [LM]  2343 Discussed with the hospitalist who will see the patient.    [LM]    Clinical Course User Index [LM] Jeannie Fend, PA-C    Final Clinical Impressions(s) / ED Diagnoses   Final diagnoses:  Syncope and collapse  Hypoglycemia  Hematoma of scalp, initial encounter    ED Discharge Orders    None       Alden Hipp 02/07/18 Ouida Sills    Eber Hong, MD 02/07/18 [phone]

## 2018-02-07 NOTE — ED Triage Notes (Signed)
Pt BIB [facility] after a near syncopal episode and fall tonight. Pt can recall the whole incident, was leaving a restaurant when she felt dizzy, pt denies LOC, did hit her head. Pt reports that she fell on Tuesday, hitting her head then as well. A&O x 4.

## 2018-02-07 NOTE — ED Notes (Signed)
Pt walked to and from restroom and to nursing station multiple times without difficulty

## 2018-02-08 ENCOUNTER — Encounter (HOSPITAL_COMMUNITY): Payer: Self-pay | Admitting: Internal Medicine

## 2018-02-08 ENCOUNTER — Other Ambulatory Visit: Payer: Self-pay

## 2018-02-08 DIAGNOSIS — R55 Syncope and collapse: Secondary | ICD-10-CM | POA: Diagnosis not present

## 2018-02-08 DIAGNOSIS — G35 Multiple sclerosis: Secondary | ICD-10-CM | POA: Diagnosis not present

## 2018-02-08 DIAGNOSIS — S0003XA Contusion of scalp, initial encounter: Secondary | ICD-10-CM | POA: Diagnosis not present

## 2018-02-08 DIAGNOSIS — R197 Diarrhea, unspecified: Secondary | ICD-10-CM | POA: Diagnosis present

## 2018-02-08 DIAGNOSIS — Z9884 Bariatric surgery status: Secondary | ICD-10-CM | POA: Diagnosis not present

## 2018-02-08 DIAGNOSIS — I10 Essential (primary) hypertension: Secondary | ICD-10-CM

## 2018-02-08 DIAGNOSIS — N390 Urinary tract infection, site not specified: Secondary | ICD-10-CM | POA: Diagnosis present

## 2018-02-08 DIAGNOSIS — E876 Hypokalemia: Secondary | ICD-10-CM | POA: Diagnosis not present

## 2018-02-08 LAB — HEPATIC FUNCTION PANEL
ALBUMIN: 3.1 g/dL — AB (ref 3.5–5.0)
ALT: 30 U/L (ref 14–54)
AST: 62 U/L — AB (ref 15–41)
Alkaline Phosphatase: 80 U/L (ref 38–126)
BILIRUBIN TOTAL: 0.9 mg/dL (ref 0.3–1.2)
Bilirubin, Direct: 0.2 mg/dL (ref 0.1–0.5)
Indirect Bilirubin: 0.7 mg/dL (ref 0.3–0.9)
Total Protein: 5.5 g/dL — ABNORMAL LOW (ref 6.5–8.1)

## 2018-02-08 LAB — CBC
HCT: 32.3 % — ABNORMAL LOW (ref 36.0–46.0)
HEMOGLOBIN: 11 g/dL — AB (ref 12.0–15.0)
MCH: 33.2 pg (ref 26.0–34.0)
MCHC: 34.1 g/dL (ref 30.0–36.0)
MCV: 97.6 fL (ref 78.0–100.0)
PLATELETS: 282 10*3/uL (ref 150–400)
RBC: 3.31 MIL/uL — AB (ref 3.87–5.11)
RDW: 12.9 % (ref 11.5–15.5)
WBC: 6 10*3/uL (ref 4.0–10.5)

## 2018-02-08 LAB — HIV ANTIBODY (ROUTINE TESTING W REFLEX): HIV SCREEN 4TH GENERATION: NONREACTIVE

## 2018-02-08 LAB — URINALYSIS, ROUTINE W REFLEX MICROSCOPIC
BILIRUBIN URINE: NEGATIVE
GLUCOSE, UA: NEGATIVE mg/dL
HGB URINE DIPSTICK: NEGATIVE
Ketones, ur: NEGATIVE mg/dL
NITRITE: NEGATIVE
PH: 5 (ref 5.0–8.0)
Protein, ur: NEGATIVE mg/dL
SPECIFIC GRAVITY, URINE: 1.006 (ref 1.005–1.030)

## 2018-02-08 LAB — BASIC METABOLIC PANEL
ANION GAP: 8 (ref 5–15)
BUN: 9 mg/dL (ref 6–20)
CALCIUM: 8.5 mg/dL — AB (ref 8.9–10.3)
CO2: 28 mmol/L (ref 22–32)
CREATININE: 0.56 mg/dL (ref 0.44–1.00)
Chloride: 102 mmol/L (ref 101–111)
GFR calc Af Amer: >60 mL/min (ref >60)
Glucose, Bld: 88 mg/dL (ref 65–99)
Potassium: 4 mmol/L (ref 3.5–5.1)
Sodium: 138 mmol/L (ref 135–145)

## 2018-02-08 LAB — D-DIMER, QUANTITATIVE (NOT AT ARMC): D DIMER QUANT: 0.39 ug{FEU}/mL (ref 0.00–0.50)

## 2018-02-08 LAB — TROPONIN I

## 2018-02-08 LAB — MAGNESIUM: MAGNESIUM: 1.7 mg/dL (ref 1.7–2.4)

## 2018-02-08 MED ORDER — PANTOPRAZOLE SODIUM 40 MG PO TBEC
40.0000 mg | DELAYED_RELEASE_TABLET | Freq: Every day | ORAL | Status: DC
  Administered 2018-02-08 – 2018-02-12 (×5): 40 mg via ORAL
  Filled 2018-02-08 (×5): qty 1

## 2018-02-08 MED ORDER — CYCLOBENZAPRINE HCL 10 MG PO TABS: 10.0000 mg | ORAL_TABLET | Freq: Two times a day (BID) | ORAL | Status: DC | PRN

## 2018-02-08 MED ORDER — LOSARTAN POTASSIUM 50 MG PO TABS
50.0000 mg | ORAL_TABLET | Freq: Every day | ORAL | Status: DC
  Administered 2018-02-08 – 2018-02-12 (×5): 50 mg via ORAL
  Filled 2018-02-08 (×5): qty 1

## 2018-02-08 MED ORDER — POTASSIUM CHLORIDE IN NACL 20-0.9 MEQ/L-% IV SOLN
INTRAVENOUS | Status: AC
  Administered 2018-02-08 – 2018-02-09 (×3): via INTRAVENOUS
  Filled 2018-02-08 (×3): qty 1000

## 2018-02-08 MED ORDER — SODIUM CHLORIDE 0.9 % IV SOLN
1.0000 g | Freq: Every day | INTRAVENOUS | Status: AC
  Administered 2018-02-08 – 2018-02-11 (×5): 1 g via INTRAVENOUS
  Filled 2018-02-08 (×5): qty 10

## 2018-02-08 MED ORDER — ACETAMINOPHEN 650 MG RE SUPP: 650.0000 mg | Freq: Four times a day (QID) | RECTAL | Status: DC | PRN

## 2018-02-08 MED ORDER — ACETAMINOPHEN 325 MG PO TABS
650.0000 mg | ORAL_TABLET | Freq: Four times a day (QID) | ORAL | Status: DC | PRN
  Filled 2018-02-08: qty 2

## 2018-02-08 MED ORDER — HYDROCODONE-ACETAMINOPHEN 5-325 MG PO TABS
1.0000 | ORAL_TABLET | ORAL | Status: DC | PRN
  Administered 2018-02-08 – 2018-02-12 (×10): 1 via ORAL
  Filled 2018-02-08 (×10): qty 1

## 2018-02-08 MED ORDER — DULOXETINE HCL 60 MG PO CPEP
60.0000 mg | ORAL_CAPSULE | Freq: Every day | ORAL | Status: DC
  Administered 2018-02-08 – 2018-02-12 (×5): 60 mg via ORAL
  Filled 2018-02-08 (×5): qty 1

## 2018-02-08 MED ORDER — ADULT MULTIVITAMIN W/MINERALS CH
1.0000 | ORAL_TABLET | Freq: Every day | ORAL | Status: DC
  Administered 2018-02-08 – 2018-02-12 (×5): 1 via ORAL
  Filled 2018-02-08 (×5): qty 1

## 2018-02-08 MED ORDER — VENLAFAXINE HCL ER 75 MG PO CP24
75.0000 mg | ORAL_CAPSULE | Freq: Every day | ORAL | Status: DC
  Administered 2018-02-08 – 2018-02-12 (×5): 75 mg via ORAL
  Filled 2018-02-08 (×6): qty 1

## 2018-02-08 MED ORDER — BUPROPION HCL ER (XL) 150 MG PO TB24
300.0000 mg | ORAL_TABLET | Freq: Every day | ORAL | Status: DC
  Administered 2018-02-08 – 2018-02-12 (×5): 300 mg via ORAL
  Filled 2018-02-08 (×5): qty 2

## 2018-02-08 MED ORDER — PREMIER PROTEIN SHAKE
11.0000 [oz_av] | Freq: Three times a day (TID) | ORAL | Status: DC
  Administered 2018-02-08 – 2018-02-11 (×9): 11 [oz_av] via ORAL
  Filled 2018-02-08 (×26): qty 325.31

## 2018-02-08 MED ORDER — ALBUTEROL SULFATE (2.5 MG/3ML) 0.083% IN NEBU: 2.5000 mg | INHALATION_SOLUTION | Freq: Four times a day (QID) | RESPIRATORY_TRACT | Status: DC | PRN

## 2018-02-08 MED ORDER — KETOROLAC TROMETHAMINE 30 MG/ML IJ SOLN
30.0000 mg | Freq: Four times a day (QID) | INTRAMUSCULAR | Status: AC | PRN
  Administered 2018-02-08 – 2018-02-09 (×2): 30 mg via INTRAVENOUS
  Filled 2018-02-08 (×2): qty 1

## 2018-02-08 NOTE — H&P (Signed)
History and Physical    Catherine Hubbard ZDG:[id number] DOB: [birth year] DOA: 02/07/2018  PCP: Patient, No Pcp Per  Patient coming from: Home.  Chief Complaint: Loss of consciousness.  HPI: Catherine Hubbard is a 57 y.o. female with history of multiple sclerosis, hypertension, hearing loss presents to the ER after patient had a syncopal episode.  Patient states she was at her workplace and was about to come home when she suddenly lost consciousness without any prodrome.  Denies any palpitation chest pain shortness of breath.  Patient states she has been having some diarrhea about 3 episodes every day for last 3 weeks.  Denies any abdominal pain or nausea vomiting.  Patient states she may have lost consciousness for about a minute but not sure.  Did not have any incontinence of urine or tongue bite.  ED Course: In the ER on exam patient has scalp hematoma.  CT head and C-spine did not show anything acute.  EKG shows normal sinus rhythm with LVH and QTC of 482 ms.  UA shows possibility of UTI.  On exam patient appears nonfocal.  Patient admitted for further work-up of syncope.  Review of Systems: As per HPI, rest all negative.   Past Medical History:  Diagnosis Date  . Asthma   . Esophagitis   . Headache   . Hearing loss   . Hypertension   . Multiple sclerosis (HCC)   . Vision abnormalities     Past Surgical History:  Procedure Laterality Date  . ANKLE FRACTURE SURGERY Bilateral   . CARPAL TUNNEL RELEASE Bilateral   . COLONOSCOPY  11/2016   multiple  . ESOPHAGOGASTRODUODENOSCOPY  11/2016  . ESOPHAGOGASTRODUODENOSCOPY N/A 02/01/2017   Procedure: ESOPHAGOGASTRODUODENOSCOPY (EGD);  Surgeon: Iva Boop, MD;  Location: [facility] ENDOSCOPY;  Service: Endoscopy;  Laterality: N/A;  . ESOPHAGOGASTRODUODENOSCOPY (EGD) WITH PROPOFOL N/A 02/04/2017   Procedure: ESOPHAGOGASTRODUODENOSCOPY (EGD) WITH PROPOFOL;  Surgeon: Meryl Dare, MD;  Location: [facility] ENDOSCOPY;  Service:  Endoscopy;  Laterality: N/A;  . LAPAROSCOPIC GASTRIC SLEEVE RESECTION  2011  . ULNAR NERVE REPAIR Bilateral      reports that she has quit smoking. She has never used smokeless tobacco. She reports that she drinks alcohol. She reports that she does not use drugs.  Allergies  Allergen Reactions  . Ibuprofen Other (See Comments)    Cannot take due to gastric bypass    Family History  Problem Relation Age of Onset  . Cancer Mother   . Stroke Mother   . Cancer Father     Prior to Admission medications   Medication Sig Start Date End Date Taking? Authorizing Provider  albuterol (PROVENTIL HFA;VENTOLIN HFA) 108 (90 Base) MCG/ACT inhaler Inhale 2 puffs into the lungs every 6 (six) hours as needed for wheezing or shortness of breath.   Yes [provider]  buPROPion (WELLBUTRIN XL) 300 MG 24 hr tablet Take 1 tablet (300 mg total) by mouth daily. 01/20/18  Yes Sater, Pearletha Furl, MD  cyclobenzaprine (FLEXERIL) 10 MG tablet Take 1 tablet (10 mg total) by mouth 2 (two) times daily as needed for muscle spasms. 12/05/17  Yes Fawze, Mina A, PA-C  DULoxetine (CYMBALTA) 60 MG capsule Take 1 capsule (60 mg total) by mouth daily. 01/20/18  Yes Sater, Pearletha Furl, MD  etodolac (LODINE) 400 MG tablet Take 1 tablet (400 mg total) by mouth 2 (two) times daily as needed. Patient taking differently: Take 400 mg by mouth daily as needed (pain).  01/20/18  Yes Sater,  Pearletha Furl, MD  HYDROcodone-acetaminophen (NORCO/VICODIN) 5-325 MG tablet Take 0.5 tablets by mouth every 6 (six) hours as needed (pain).   Yes [provider]  losartan (COZAAR) 50 MG tablet Take 1 tablet (50 mg total) by mouth daily. 12/23/17  Yes Sater, Pearletha Furl, MD  modafinil (PROVIGIL) 200 MG tablet Take 1 tablet (200 mg total) by mouth daily. Patient taking differently: Take 100-200 mg by mouth See admin instructions. Take one tablet (200 mg) by mouth in the morning or 1/2 tablet (100 mg) in the afternoon - as needed to focus 01/20/18  Yes  Sater, Pearletha Furl, MD  Multiple Vitamin (MULTIVITAMIN WITH MINERALS) TABS tablet Take 2 tablets by mouth daily.   Yes [provider]  Multiple Vitamins-Minerals (HAIR/SKIN/NAILS/BIOTIN) TABS Take 2 tablets by mouth daily.   Yes [provider]  ocrelizumab 600 mg in sodium chloride 0.9 % 500 mL Inject 600 mg into the vein every 6 (six) months. Last infusion May 2019 - due in December 2019   Yes [provider]  omeprazole (PRILOSEC) 20 MG capsule Take 40 mg by mouth 2 (two) times daily.   Yes [provider]  protein supplement shake (PREMIER PROTEIN) LIQD Take 325 mLs (11 oz total) by mouth 3 (three) times daily between meals. 02/04/17  Yes Narda Bonds, MD  venlafaxine XR (EFFEXOR-XR) 75 MG 24 hr capsule Take 75 mg by mouth daily with breakfast. 01/24/18  Yes [provider]  diazepam (VALIUM) 5 MG tablet TAKE 1 TABLET BY MOUTH EVERY 12 HOURS AS NEEDED FOR MUSCLE SPASMS Patient not taking: Reported on 01/20/2018 07/04/17   Asa Lente, MD    Physical Exam: Vitals:   02/07/18 2215 02/07/18 2230 02/07/18 2345 02/08/18 0033  BP: (!) 141/90  116/84 111/79  Pulse:   91 86  Resp: 15 (!) 30 17 18   Temp:    99.1 F (37.3 C)  TempSrc:    Oral  SpO2:   100% 98%      Constitutional: Moderately built and nourished. Vitals:   02/07/18 2215 02/07/18 2230 02/07/18 2345 02/08/18 0033  BP: (!) 141/90  116/84 111/79  Pulse:   91 86  Resp: 15 (!) 30 17 18   Temp:    99.1 F (37.3 C)  TempSrc:    Oral  SpO2:   100% 98%   Eyes: Anicteric no pallor. ENMT: No discharge from the ears eyes nose or mouth. Neck: No mass felt.  No neck rigidity. Respiratory: No rhonchi or crepitations. Cardiovascular: S1-S2 heard no murmurs appreciated. Abdomen: Soft nontender bowel sounds present. Musculoskeletal: No edema.  No joint effusion. Skin: No rash. Neurologic: Alert awake oriented to time place and person.  Moves all extremities. Psychiatric: Appears normal  per normal affect.   Labs on Admission: I have personally reviewed following labs and imaging studies  CBC: Recent Labs  Lab 02/07/18 2018  WBC 8.0  NEUTROABS 5.5  HGB 11.3*  HCT 33.2*  MCV 98.2  PLT 327   Basic Metabolic Panel: Recent Labs  Lab 02/07/18 2018  NA 130*  K 3.2*  CL 95*  CO2 24  GLUCOSE 64*  BUN 11  CREATININE 0.71  CALCIUM 8.5*   GFR: CrCl cannot be calculated (Unknown ideal weight.). Liver Function Tests: No results for input(s): AST, ALT, ALKPHOS, BILITOT, PROT, ALBUMIN in the last 168 hours. No results for input(s): LIPASE, AMYLASE in the last 168 hours. No results for input(s): AMMONIA in the last 168 hours. Coagulation Profile: No  results for input(s): INR, PROTIME in the last 168 hours. Cardiac Enzymes: Recent Labs  Lab 02/07/18 2029  TROPONINI <0.03   BNP (last 3 results) No results for input(s): PROBNP in the last 8760 hours. HbA1C: No results for input(s): HGBA1C in the last 72 hours. CBG: Recent Labs  Lab 02/07/18 2338  GLUCAP 64*   Lipid Profile: No results for input(s): CHOL, HDL, LDLCALC, TRIG, CHOLHDL, LDLDIRECT in the last 72 hours. Thyroid Function Tests: No results for input(s): TSH, T4TOTAL, FREET4, T3FREE, THYROIDAB in the last 72 hours. Anemia Panel: No results for input(s): VITAMINB12, FOLATE, FERRITIN, TIBC, IRON, RETICCTPCT in the last 72 hours. Urine analysis:    Component Value Date/Time   COLORURINE YELLOW 02/07/2018 2019   APPEARANCEUR HAZY (A) 02/07/2018 2019   LABSPEC 1.006 02/07/2018 2019   PHURINE 5.0 02/07/2018 2019   GLUCOSEU NEGATIVE 02/07/2018 2019   HGBUR NEGATIVE 02/07/2018 2019   BILIRUBINUR NEGATIVE 02/07/2018 2019   KETONESUR NEGATIVE 02/07/2018 2019   PROTEINUR NEGATIVE 02/07/2018 2019   NITRITE NEGATIVE 02/07/2018 2019   LEUKOCYTESUR TRACE (A) 02/07/2018 2019   Sepsis Labs: @LABRCNTIP (procalcitonin:4,lacticidven:4) )No results found for this or any previous visit (from the past 240  hour(s)).   Radiological Exams on Admission: Ct Head Wo Contrast  Result Date: 02/07/2018 CLINICAL DATA:  Pt BIB [facility] after a near syncopal episode and fall tonight. Pt can recall the whole incident, was leaving a restaurant when she felt dizzy, pt denies LOC, did hit her head EXAM: CT HEAD WITHOUT CONTRAST CT CERVICAL SPINE WITHOUT CONTRAST TECHNIQUE: Multidetector CT imaging of the head and cervical spine was performed following the standard protocol without intravenous contrast. Multiplanar CT image reconstructions of the cervical spine were also generated. COMPARISON:  08/04/2017 FINDINGS: CT HEAD FINDINGS Brain: No evidence of acute infarction, hemorrhage, hydrocephalus, extra-axial collection or mass lesion/mass effect. There is ventricular and sulcal enlargement reflecting atrophy advanced for age. Patchy white matter hypoattenuation is noted bilaterally consistent with mild to moderate chronic microvascular ischemic change. Vascular: No hyperdense vessel or unexpected calcification. Skull: Normal. Negative for fracture or focal lesion. Sinuses/Orbits: Globes and orbits are unremarkable. Sinuses and mastoid air cells are clear. Other: None. CT CERVICAL SPINE FINDINGS Alignment: Normal. Skull base and vertebrae: No acute fracture. No primary bone lesion or focal pathologic process. Soft tissues and spinal canal: No prevertebral fluid or swelling. No visible canal hematoma. Disc levels: Moderate loss of disc height at C5-C6, C6-C7 and C7-T1. Mild spondylotic disc bulging and endplate spurring at these levels. No convincing disc herniation. Facet degenerative changes are noted, greater on the right. Upper chest: No acute findings. No masses or enlarged lymph nodes. Clear lung apices. Other: None. IMPRESSION: HEAD CT 1. No acute intracranial abnormalities. 2. Atrophy advanced for age. Mild to moderate chronic microvascular ischemic change. CERVICAL CT 1. No fracture or acute finding. Electronically Signed    By: Amie Portland M.D.   On: 02/07/2018 20:57   Ct Cervical Spine Wo Contrast  Result Date: 02/07/2018 CLINICAL DATA:  Pt BIB [facility] after a near syncopal episode and fall tonight. Pt can recall the whole incident, was leaving a restaurant when she felt dizzy, pt denies LOC, did hit her head EXAM: CT HEAD WITHOUT CONTRAST CT CERVICAL SPINE WITHOUT CONTRAST TECHNIQUE: Multidetector CT imaging of the head and cervical spine was performed following the standard protocol without intravenous contrast. Multiplanar CT image reconstructions of the cervical spine were also generated. COMPARISON:  08/04/2017 FINDINGS: CT HEAD FINDINGS Brain: No evidence of  acute infarction, hemorrhage, hydrocephalus, extra-axial collection or mass lesion/mass effect. There is ventricular and sulcal enlargement reflecting atrophy advanced for age. Patchy white matter hypoattenuation is noted bilaterally consistent with mild to moderate chronic microvascular ischemic change. Vascular: No hyperdense vessel or unexpected calcification. Skull: Normal. Negative for fracture or focal lesion. Sinuses/Orbits: Globes and orbits are unremarkable. Sinuses and mastoid air cells are clear. Other: None. CT CERVICAL SPINE FINDINGS Alignment: Normal. Skull base and vertebrae: No acute fracture. No primary bone lesion or focal pathologic process. Soft tissues and spinal canal: No prevertebral fluid or swelling. No visible canal hematoma. Disc levels: Moderate loss of disc height at C5-C6, C6-C7 and C7-T1. Mild spondylotic disc bulging and endplate spurring at these levels. No convincing disc herniation. Facet degenerative changes are noted, greater on the right. Upper chest: No acute findings. No masses or enlarged lymph nodes. Clear lung apices. Other: None. IMPRESSION: HEAD CT 1. No acute intracranial abnormalities. 2. Atrophy advanced for age. Mild to moderate chronic microvascular ischemic change. CERVICAL CT 1. No fracture or acute finding.  Electronically Signed   By: Amie Portland M.D.   On: 02/07/2018 20:57   Dg Chest Port 1 View  Result Date: 02/07/2018 CLINICAL DATA:  Pt BIB [facility] after a near syncopal episode and fall tonight. Pt can recall the whole incident, was leaving a restaurant when she felt dizzy, pt denies LOC, did hit her head. Pt reports that she fell on Tuesday, hitting her head then as well. AANDO x 4 EXAM: PORTABLE CHEST 1 VIEW COMPARISON:  12/14/2017 FINDINGS: Cardiac silhouette is normal in size. No mediastinal or hilar masses. No evidence of adenopathy. Clear lungs.  No pleural effusion or pneumothorax. Bilateral old healed rib fractures. Skeletal structures are diffusely demineralized. IMPRESSION: No acute cardiopulmonary disease. Electronically Signed   By: Amie Portland M.D.   On: 02/07/2018 20:51    EKG: Independently reviewed.  Normal sinus rhythm with LVH and QTC of 42 ms.  Assessment/Plan Principal Problem:   Syncope Active Problems:   Multiple sclerosis (HCC)   Gastric bypass status for obesity   Essential hypertension   Hypokalemia   Diarrhea   Acute lower UTI   Hematoma of scalp    1. Syncope -cause not clear.  Patient did not have any prodrome so concerning for any cardiac event.  We will closely monitor in telemetry check 2D echo cycle cardiac markers check d-dimer.  Check orthostatics in a.m.  Patient is mildly hypokalemic so replace and recheck.  Check magnesium levels. 2. Hypokalemia from diarrhea.  Replace recheck.  Check magnesium levels. 3. Diarrhea -has been going on for last 3 weeks.  If there is any further episodes we will order stool studies. 4. Hypertension on ARB. 5. History of depression on Cymbalta Effexor Wellbutrin.  None of which are new medications. 6. History of multiple sclerosis on Orcelizumab.  Follows with Dr. Epimenio Foot.   DVT prophylaxis: SCDs since patient has scalp hematoma. Code Status: Full code. Family Communication: Discussed with patient. Disposition Plan:  Home. Consults called: Physical therapy. Admission status: Observation.   Eduard Clos MD [facility] Pager [phone].  If 7PM-7AM, please contact night-coverage [email or web address] Password [facility]  02/08/2018, 2:48 AM

## 2018-02-08 NOTE — Evaluation (Addendum)
Physical Therapy Evaluation Patient Details Name: Catherine Hubbard MRN: [id number] DOB: May 02, 1961 Today's Date: 02/08/2018   History of Present Illness  Pt is a 57 y.o. F with significant PMH of multiple sclerosis, hypertension, hearing loss who presents following a syncopal episode. States she has been having some diarrhea about 3 episodes every day for last 3 weeks.  Clinical Impression  Pt admitted with above diagnosis. Pt currently with functional limitations due to the deficits listed below (see PT Problem List). Patient presenting at baseline with functional mobility. However, does present with high balance deficits secondary to Multiple Sclerosis. Reports history of frequent falling. Scored 14/24 on the Dynamic Gait Index, indicating she is at risk for falls. Also has orthostatic hypotension and complaints of mild dizziness in standing: Supine - 157/96, Sitting: 143/98, Standing: 135/92.  Pt will benefit from skilled PT to increase their independence and safety with mobility to allow discharge to the venue listed below.       Follow Up Recommendations Outpatient PT;Supervision for mobility/OOB   Patient concerned about cost of outpatient PT so provided with information on [facility] clinic. Phone: [phone], Fax for referrals: [phone]    Equipment Recommendations  None recommended by PT    Recommendations for Other Services       Precautions / Restrictions Precautions Precautions: Fall Restrictions Weight Bearing Restrictions: No      Mobility  Bed Mobility Overal bed mobility: Modified Independent                Transfers Overall transfer level: Modified independent                  Ambulation/Gait Ambulation/Gait assistance: Min guard;Min assist Gait Distance (Feet): 400 Feet Assistive device: None Gait Pattern/deviations: Step-through pattern;Decreased dorsiflexion - right;Decreased dorsiflexion - left;Decreased stride  length Gait velocity: decreased   General Gait Details: Patient requiring up to min assist for dynamic gait. Displays unsteadiness with high level balance tasks including head turns, pivots, and stepping over/around obstacles  Stairs            Wheelchair Mobility    Modified Rankin (Stroke Patients Only)       Balance Overall balance assessment: Needs assistance Sitting-balance support: No upper extremity supported;Feet supported Sitting balance-Leahy Scale: Normal     Standing balance support: No upper extremity supported;During functional activity Standing balance-Leahy Scale: Good Standing balance comment: Able to statically stand independently but needs additional support for dynamic balance                 Standardized Balance Assessment Standardized Balance Assessment : Dynamic Gait Index   Dynamic Gait Index Level Surface: Mild Impairment Change in Gait Speed: Mild Impairment Gait with Horizontal Head Turns: Mild Impairment Gait with Vertical Head Turns: Mild Impairment Gait and Pivot Turn: Moderate Impairment Step Over Obstacle: Moderate Impairment Step Around Obstacles: Mild Impairment Steps: Mild Impairment Total Score: 14       Pertinent Vitals/Pain Pain Assessment: No/denies pain    Home Living Family/patient expects to be discharged to:: Private residence Living Arrangements: Spouse/significant other Available Help at Discharge: Family Type of Home: House Home Access: Stairs to enter Entrance Stairs-Rails: None Entrance Stairs-Number of Steps: 2 Home Layout: One level Home Equipment: Environmental consultant - 2 wheels      Prior Function Level of Independence: Independent         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 from deficits related to MS  Hand Dominance        Extremity/Trunk Assessment   Upper Extremity Assessment Upper Extremity Assessment: Overall WFL for tasks assessed    Lower  Extremity Assessment Lower Extremity Assessment: Overall WFL for tasks assessed    Cervical / Trunk Assessment Cervical / Trunk Assessment: Normal  Communication   Communication: No difficulties  Cognition Arousal/Alertness: Awake/alert Behavior During Therapy: WFL for tasks assessed/performed Overall Cognitive Status: Within Functional Limits for tasks assessed                                        General Comments      Exercises     Assessment/Plan    PT Assessment Patient needs continued PT services  PT Problem List Decreased balance;Decreased mobility       PT Treatment Interventions Gait training;Functional mobility training;Stair training;Therapeutic activities;Therapeutic exercise;Balance training;Patient/family education    PT Goals (Current goals can be found in the Care Plan section)  Acute Rehab PT Goals Patient Stated Goal: go home PT Goal Formulation: With patient Time For Goal Achievement: 02/22/18 Potential to Achieve Goals: Good    Frequency Min 3X/week   Barriers to discharge        Co-evaluation               AM-PAC PT "6 Clicks" Daily Activity  Outcome Measure Difficulty turning over in bed (including adjusting bedclothes, sheets and blankets)?: None Difficulty moving from lying on back to sitting on the side of the bed? : None Difficulty sitting down on and standing up from a chair with arms (e.g., wheelchair, bedside commode, etc,.)?: None Help needed moving to and from a bed to chair (including a wheelchair)?: A Little Help needed walking in hospital room?: A Little Help needed climbing 3-5 steps with a railing? : A Little 6 Click Score: 21    End of Session Equipment Utilized During Treatment: Gait belt Activity Tolerance: Patient tolerated treatment well Patient left: in bed;with call bell/phone within reach;with bed alarm set Nurse Communication: Mobility status PT Visit Diagnosis: Unsteadiness on feet  (R26.81);Difficulty in walking, not elsewhere classified (R26.2)    Time: 9147-8295 PT Time Calculation (min) (ACUTE ONLY): 33 min   Charges:   PT Evaluation $PT Eval Low Complexity: 1 Low PT Treatments $Therapeutic Activity: 8-22 mins   PT G Codes:        Laurina Bustle, PT, DPT Acute Rehabilitation Services  Pager: [phone]   Vanetta Mulders 02/08/2018, 1:16 PM

## 2018-02-08 NOTE — Progress Notes (Signed)
PROGRESS NOTE    MAYANI HATTAN  XNA:[id number] DOB: 10/17/1960 DOA: 02/07/2018 PCP: Patient, No Pcp Per    Brief Narrative:  57 y.o. female with history of multiple sclerosis, hypertension, hearing loss presents to the ER after patient had a syncopal episode.  Patient states she was at her workplace and was about to come home when she suddenly lost consciousness without any prodrome.  Denies any palpitation chest pain shortness of breath.  Patient states she has been having some diarrhea about 3 episodes every day for last 3 weeks.  Denies any abdominal pain or nausea vomiting.  Patient states she may have lost consciousness for about a minute but not sure.  Did not have any incontinence of urine or tongue bite.  ED Course: In the ER on exam patient has scalp hematoma.  CT head and C-spine did not show anything acute.  EKG shows normal sinus rhythm with LVH and QTC of 482 ms.  UA shows possibility of UTI.  On exam patient appears nonfocal.  Patient admitted for further work-up of syncope  Assessment & Plan:   Principal Problem:   Syncope Active Problems:   Multiple sclerosis (HCC)   Gastric bypass status for obesity   Essential hypertension   Hypokalemia   Diarrhea   Acute lower UTI   Hematoma of scalp  1. Orthostatic Syncope  1. Orthostatics reviewed. Patient is orthostatic 2. Patient with several week hx of diarrhea, likely etiology of dehydration 3. Clinically improved with IVF hydration, however still mildly orthostatic. 4. Will continue IVF hydration 5. Repeat BMET and orthostatics in AM 2. Hypokalemia from diarrhea.   1. Replaced.   2. Check magnesium levels. 3. Diarrhea  1. Patient stable thus far 2. No stool reported thus far 4. Hypertension 1. BP stable on ARB 2. Will continue 5. History of depression  1. Will continue on Cymbalta Effexor Wellbutrin. 6. History of multiple sclerosis on Orcelizumab.   1. Patient is followed by Dr. Epimenio Foot. 2. Appears  stable  DVT prophylaxis: SCD's Code Status: Full Family Communication: Pt in room, family at bedside Disposition Plan: Possible d/c in 24-48hrs  Consultants:     Procedures:     Antimicrobials: Anti-infectives (From admission, onward)   Start     Dose/Rate Route Frequency Ordered Stop   02/08/18 0345  cefTRIAXone (ROCEPHIN) 1 g in sodium chloride 0.9 % 100 mL IVPB     1 g 200 mL/hr over 30 Minutes Intravenous Daily at bedtime 02/08/18 0249         Subjective: Reports feeling better. Tolerating regular diet  Objective: Vitals:   02/08/18 0441 02/08/18 0603 02/08/18 0604 02/08/18 0745  BP: 119/85   (!) 141/92  Pulse: 79   70  Resp: 18   18  Temp: 98.3 F (36.8 C)   97.9 F (36.6 C)  TempSrc: Oral   Oral  SpO2: 97%   97%  Weight:   63 kg (138 lb 14.2 oz)   Height:  5\' 5"  (1.651 m)      Intake/Output Summary (Last 24 hours) at 02/08/2018 1450 Last data filed at 02/08/2018 1308 Gross per 24 hour  Intake 883.97 ml  Output -  Net 883.97 ml   Filed Weights   02/08/18 0604  Weight: 63 kg (138 lb 14.2 oz)    Examination:  General exam: Appears calm and comfortable  Respiratory system: Clear to auscultation. Respiratory effort normal. Cardiovascular system: S1 & S2 heard, RRR. Gastrointestinal system: Abdomen is nondistended, soft and  nontender. No organomegaly or masses felt. Normal bowel sounds heard. Central nervous system: Alert and oriented. No focal neurological deficits. Extremities: Symmetric 5 x 5 power. Skin: No rashes, lesions  Psychiatry: Judgement and insight appear normal. Mood & affect appropriate.   Data Reviewed: I have personally reviewed following labs and imaging studies  CBC: Recent Labs  Lab 02/07/18 2018 02/08/18 0433  WBC 8.0 6.0  NEUTROABS 5.5  --   HGB 11.3* 11.0*  HCT 33.2* 32.3*  MCV 98.2 97.6  PLT 327 282   Basic Metabolic Panel: Recent Labs  Lab 02/07/18 2018 02/08/18 0433  NA 130* 138  K 3.2* 4.0  CL 95* 102    CO2 24 28  GLUCOSE 64* 88  BUN 11 9  CREATININE 0.71 0.56  CALCIUM 8.5* 8.5*  MG  --  1.7   GFR: Estimated Creatinine Clearance: 69.8 mL/min (by C-G formula based on SCr of 0.56 mg/dL). Liver Function Tests: Recent Labs  Lab 02/08/18 0433  AST 62*  ALT 30  ALKPHOS 80  BILITOT 0.9  PROT 5.5*  ALBUMIN 3.1*   No results for input(s): LIPASE, AMYLASE in the last 168 hours. No results for input(s): AMMONIA in the last 168 hours. Coagulation Profile: No results for input(s): INR, PROTIME in the last 168 hours. Cardiac Enzymes: Recent Labs  Lab 02/07/18 2029 02/08/18 0433 02/08/18 1008  TROPONINI <0.03 <0.03 <0.03   BNP (last 3 results) No results for input(s): PROBNP in the last 8760 hours. HbA1C: No results for input(s): HGBA1C in the last 72 hours. CBG: Recent Labs  Lab 02/07/18 2338  GLUCAP 64*   Lipid Profile: No results for input(s): CHOL, HDL, LDLCALC, TRIG, CHOLHDL, LDLDIRECT in the last 72 hours. Thyroid Function Tests: No results for input(s): TSH, T4TOTAL, FREET4, T3FREE, THYROIDAB in the last 72 hours. Anemia Panel: No results for input(s): VITAMINB12, FOLATE, FERRITIN, TIBC, IRON, RETICCTPCT in the last 72 hours. Sepsis Labs: No results for input(s): PROCALCITON, LATICACIDVEN in the last 168 hours.  No results found for this or any previous visit (from the past 240 hour(s)).   Radiology Studies: Ct Head Wo Contrast  Result Date: 02/07/2018 CLINICAL DATA:  Pt BIB [facility] after a near syncopal episode and fall tonight. Pt can recall the whole incident, was leaving a restaurant when she felt dizzy, pt denies LOC, did hit her head EXAM: CT HEAD WITHOUT CONTRAST CT CERVICAL SPINE WITHOUT CONTRAST TECHNIQUE: Multidetector CT imaging of the head and cervical spine was performed following the standard protocol without intravenous contrast. Multiplanar CT image reconstructions of the cervical spine were also generated. COMPARISON:  08/04/2017 FINDINGS: CT HEAD  FINDINGS Brain: No evidence of acute infarction, hemorrhage, hydrocephalus, extra-axial collection or mass lesion/mass effect. There is ventricular and sulcal enlargement reflecting atrophy advanced for age. Patchy white matter hypoattenuation is noted bilaterally consistent with mild to moderate chronic microvascular ischemic change. Vascular: No hyperdense vessel or unexpected calcification. Skull: Normal. Negative for fracture or focal lesion. Sinuses/Orbits: Globes and orbits are unremarkable. Sinuses and mastoid air cells are clear. Other: None. CT CERVICAL SPINE FINDINGS Alignment: Normal. Skull base and vertebrae: No acute fracture. No primary bone lesion or focal pathologic process. Soft tissues and spinal canal: No prevertebral fluid or swelling. No visible canal hematoma. Disc levels: Moderate loss of disc height at C5-C6, C6-C7 and C7-T1. Mild spondylotic disc bulging and endplate spurring at these levels. No convincing disc herniation. Facet degenerative changes are noted, greater on the right. Upper chest: No acute findings. No  masses or enlarged lymph nodes. Clear lung apices. Other: None. IMPRESSION: HEAD CT 1. No acute intracranial abnormalities. 2. Atrophy advanced for age. Mild to moderate chronic microvascular ischemic change. CERVICAL CT 1. No fracture or acute finding. Electronically Signed   By: Amie Portland M.D.   On: 02/07/2018 20:57   Ct Cervical Spine Wo Contrast  Result Date: 02/07/2018 CLINICAL DATA:  Pt BIB [facility] after a near syncopal episode and fall tonight. Pt can recall the whole incident, was leaving a restaurant when she felt dizzy, pt denies LOC, did hit her head EXAM: CT HEAD WITHOUT CONTRAST CT CERVICAL SPINE WITHOUT CONTRAST TECHNIQUE: Multidetector CT imaging of the head and cervical spine was performed following the standard protocol without intravenous contrast. Multiplanar CT image reconstructions of the cervical spine were also generated. COMPARISON:  08/04/2017  FINDINGS: CT HEAD FINDINGS Brain: No evidence of acute infarction, hemorrhage, hydrocephalus, extra-axial collection or mass lesion/mass effect. There is ventricular and sulcal enlargement reflecting atrophy advanced for age. Patchy white matter hypoattenuation is noted bilaterally consistent with mild to moderate chronic microvascular ischemic change. Vascular: No hyperdense vessel or unexpected calcification. Skull: Normal. Negative for fracture or focal lesion. Sinuses/Orbits: Globes and orbits are unremarkable. Sinuses and mastoid air cells are clear. Other: None. CT CERVICAL SPINE FINDINGS Alignment: Normal. Skull base and vertebrae: No acute fracture. No primary bone lesion or focal pathologic process. Soft tissues and spinal canal: No prevertebral fluid or swelling. No visible canal hematoma. Disc levels: Moderate loss of disc height at C5-C6, C6-C7 and C7-T1. Mild spondylotic disc bulging and endplate spurring at these levels. No convincing disc herniation. Facet degenerative changes are noted, greater on the right. Upper chest: No acute findings. No masses or enlarged lymph nodes. Clear lung apices. Other: None. IMPRESSION: HEAD CT 1. No acute intracranial abnormalities. 2. Atrophy advanced for age. Mild to moderate chronic microvascular ischemic change. CERVICAL CT 1. No fracture or acute finding. Electronically Signed   By: Amie Portland M.D.   On: 02/07/2018 20:57   Dg Chest Port 1 View  Result Date: 02/07/2018 CLINICAL DATA:  Pt BIB [facility] after a near syncopal episode and fall tonight. Pt can recall the whole incident, was leaving a restaurant when she felt dizzy, pt denies LOC, did hit her head. Pt reports that she fell on Tuesday, hitting her head then as well. AANDO x 4 EXAM: PORTABLE CHEST 1 VIEW COMPARISON:  12/14/2017 FINDINGS: Cardiac silhouette is normal in size. No mediastinal or hilar masses. No evidence of adenopathy. Clear lungs.  No pleural effusion or pneumothorax. Bilateral old healed  rib fractures. Skeletal structures are diffusely demineralized. IMPRESSION: No acute cardiopulmonary disease. Electronically Signed   By: Amie Portland M.D.   On: 02/07/2018 20:51    Scheduled Meds: . buPROPion  300 mg Oral Daily  . DULoxetine  60 mg Oral Daily  . losartan  50 mg Oral Daily  . multivitamin with minerals  1 tablet Oral Daily  . pantoprazole  40 mg Oral Daily  . protein supplement shake  11 oz Oral TID BM  . venlafaxine XR  75 mg Oral Q breakfast   Continuous Infusions: . 0.9 % NaCl with KCl 20 mEq / L 100 mL/hr at 02/08/18 0659  . cefTRIAXone (ROCEPHIN)  IV Stopped (02/08/18 0444)     LOS: 0 days   Rickey Barbara, MD [facility] Pager [phone]  If 7PM-7AM, please contact night-coverage [email or web address] Password [email or web address] 02/08/2018, 2:50 PM

## 2018-02-08 NOTE — Progress Notes (Signed)
Called ER RN for report. Room ready.  

## 2018-02-09 DIAGNOSIS — N390 Urinary tract infection, site not specified: Secondary | ICD-10-CM | POA: Diagnosis not present

## 2018-02-09 DIAGNOSIS — I10 Essential (primary) hypertension: Secondary | ICD-10-CM | POA: Diagnosis not present

## 2018-02-09 DIAGNOSIS — S0003XA Contusion of scalp, initial encounter: Secondary | ICD-10-CM

## 2018-02-09 DIAGNOSIS — Z9884 Bariatric surgery status: Secondary | ICD-10-CM | POA: Diagnosis not present

## 2018-02-09 LAB — BASIC METABOLIC PANEL
Anion gap: 6 (ref 5–15)
CALCIUM: 9.1 mg/dL (ref 8.9–10.3)
CO2: 32 mmol/L (ref 22–32)
CREATININE: 0.54 mg/dL (ref 0.44–1.00)
Chloride: 102 mmol/L (ref 101–111)
GFR calc Af Amer: >60 mL/min (ref >60)
GFR calc non Af Amer: >60 mL/min (ref >60)
GLUCOSE: 86 mg/dL (ref 65–99)
POTASSIUM: 4.2 mmol/L (ref 3.5–5.1)
Sodium: 140 mmol/L (ref 135–145)

## 2018-02-09 MED ORDER — LACTATED RINGERS IV SOLN
INTRAVENOUS | Status: DC
Start: 2018-02-09 — End: 2018-02-12
  Administered 2018-02-09 – 2018-02-11 (×5): via INTRAVENOUS

## 2018-02-09 NOTE — Progress Notes (Signed)
Physical Therapy Treatment Patient Details Name: Catherine Hubbard MRN: [id number] DOB: 1960-11-19 Today's Date: 02/09/2018    History of Present Illness Pt is a 57 y.o. F with significant PMH of multiple sclerosis, hypertension, hearing loss who presents following a syncopal episode. States she has been having some diarrhea about 3 episodes every day for last 3 weeks.    PT Comments    Patient with improvement in dynamic balance, only requiring supervision for ambulation in hallway with high balance activities including side stepping, backwards walking, and turning. No complaints of dizziness, but did note patient was orthostatic this AM. Rest of session focused on standing therapeutic exercise HEP for proximal strengthening. Patient remains an excellent candidate for outpatient PT.    Follow Up Recommendations  Outpatient PT;Supervision for mobility/OOB     Equipment Recommendations  None recommended by PT    Recommendations for Other Services       Precautions / Restrictions Precautions Precautions: Fall Restrictions Weight Bearing Restrictions: No    Mobility  Bed Mobility Overal bed mobility: Independent                Transfers Overall transfer level: Modified independent                  Ambulation/Gait Ambulation/Gait assistance: Supervision Gait Distance (Feet): 200 Feet Assistive device: None Gait Pattern/deviations: Step-through pattern;Decreased dorsiflexion - right;Decreased dorsiflexion - left;Decreased stride length Gait velocity: decreased   General Gait Details: patient requiring supervision for safety with dynamic ambulation   Stairs             Wheelchair Mobility    Modified Rankin (Stroke Patients Only)       Balance Overall balance assessment: Needs assistance Sitting-balance support: No upper extremity supported;Feet supported Sitting balance-Leahy Scale: Normal     Standing balance support: No upper  extremity supported;During functional activity Standing balance-Leahy Scale: Good Standing balance comment: Able to statically stand independently but needs additional support for dynamic balance                 Standardized Balance Assessment Standardized Balance Assessment : Dynamic Gait Index   Dynamic Gait Index Level Surface: Mild Impairment Change in Gait Speed: Mild Impairment Gait with Horizontal Head Turns: Mild Impairment Gait with Vertical Head Turns: Mild Impairment Gait and Pivot Turn: Moderate Impairment Step Over Obstacle: Moderate Impairment Step Around Obstacles: Mild Impairment Steps: Mild Impairment Total Score: 14      Cognition Arousal/Alertness: Awake/alert Behavior During Therapy: WFL for tasks assessed/performed Overall Cognitive Status: Within Functional Limits for tasks assessed                                        Exercises Other Exercises Other Exercises: Standing bilateral hip abduction x 10 Other Exercises: Standing bilateral hip extension x 10 Other Exercises: Standing marching x 40 Other Exercises: Standing calf "runner's" stretch x 1 minute bilaterally Other Exercises: dynamic gait including side stepping, turns, and backwards walking    General Comments        Pertinent Vitals/Pain Pain Assessment: No/denies pain    Home Living                      Prior Function            PT Goals (current goals can now be found in the care plan section) Acute Rehab PT Goals  Patient Stated Goal: go home PT Goal Formulation: With patient Time For Goal Achievement: 02/22/18 Potential to Achieve Goals: Good Progress towards PT goals: Progressing toward goals    Frequency    Min 3X/week      PT Plan Current plan remains appropriate    Co-evaluation              AM-PAC PT "6 Clicks" Daily Activity  Outcome Measure  Difficulty turning over in bed (including adjusting bedclothes, sheets and  blankets)?: None Difficulty moving from lying on back to sitting on the side of the bed? : None Difficulty sitting down on and standing up from a chair with arms (e.g., wheelchair, bedside commode, etc,.)?: None Help needed moving to and from a bed to chair (including a wheelchair)?: A Little Help needed walking in hospital room?: A Little Help needed climbing 3-5 steps with a railing? : A Little 6 Click Score: 21    End of Session Equipment Utilized During Treatment: Gait belt Activity Tolerance: Patient tolerated treatment well Patient left: in bed;with call bell/phone within reach;with bed alarm set Nurse Communication: Mobility status PT Visit Diagnosis: Unsteadiness on feet (R26.81);Difficulty in walking, not elsewhere classified (R26.2)     Time: 1624-4695 PT Time Calculation (min) (ACUTE ONLY): 16 min  Charges:  $Therapeutic Exercise: 8-22 mins                    G Codes:     Laurina Bustle, PT, DPT Acute Rehabilitation Services  Pager: [phone]    Vanetta Mulders 02/09/2018, 12:25 PM

## 2018-02-09 NOTE — Progress Notes (Signed)
PROGRESS NOTE    Catherine Hubbard  ZOX:[id number] DOB: Jan 03, 1961 DOA: 02/07/2018 PCP: Patient, No Pcp Per    Brief Narrative:  57 y.o. female with history of multiple sclerosis, hypertension, hearing loss presents to the ER after patient had a syncopal episode.  Patient states she was at her workplace and was about to come home when she suddenly lost consciousness without any prodrome.  Denies any palpitation chest pain shortness of breath.  Patient states she has been having some diarrhea about 3 episodes every day for last 3 weeks.  Denies any abdominal pain or nausea vomiting.  Patient states she may have lost consciousness for about a minute but not sure.  Did not have any incontinence of urine or tongue bite.  ED Course: In the ER on exam patient has scalp hematoma.  CT head and C-spine did not show anything acute.  EKG shows normal sinus rhythm with LVH and QTC of 482 ms.  UA shows possibility of UTI.  On exam patient appears nonfocal.  Patient admitted for further work-up of syncope  Assessment & Plan:   Principal Problem:   Syncope Active Problems:   Multiple sclerosis (HCC)   Gastric bypass status for obesity   Essential hypertension   Hypokalemia   Diarrhea   Acute lower UTI   Hematoma of scalp  1. Orthostatic Syncope  1. Orthostatics reviewed. Patient remains orthostatic this AM (sbp drop from 120's to 99 from sitting to standing associated with dizziness) 2. Patient with several week hx of diarrhea, likely etiology of dehydration 3. Clinically improved with IVF hydration, however still mildly orthostatic per above. 4. Patient continued on IVF, tolerating thus far 5. Will repeat bmet in AM 2. Hypokalemia from diarrhea.   1. Normalized   2. Check magnesium levels. 3. Diarrhea  1. Patient stable thus far 2. No further episodes since hospital visit 4. Hypertension 1. Will continue on cozaar as tolerated 2. bp stable, albeit suboptimally controlled 5. History  of depression  1. Will continue on Cymbalta Effexor Wellbutrin as tolerated. 6. History of multiple sclerosis on Orcelizumab.   1. Patient is followed by Dr. Epimenio Foot as outpatient. 2. Presently stable  DVT prophylaxis: SCD's Code Status: Full Family Communication: Pt in room, family at bedside Disposition Plan: Possible d/c in 24-48hrs  Consultants:     Procedures:     Antimicrobials: Anti-infectives (From admission, onward)   Start     Dose/Rate Route Frequency Ordered Stop   02/08/18 0345  cefTRIAXone (ROCEPHIN) 1 g in sodium chloride 0.9 % 100 mL IVPB     1 g 200 mL/hr over 30 Minutes Intravenous Daily at bedtime 02/08/18 0249        Subjective: Tolerating diet well. Voiding well. Somewhat dizzy upon sitting up this AM  Objective: Vitals:   02/08/18 2149 02/08/18 2151 02/09/18 0444 02/09/18 0836  BP: (!) 145/98 (!) 149/99 (!) 155/106 136/85  Pulse: 75 78 74 78  Resp: 16   18  Temp: 98.2 F (36.8 C)  98.1 F (36.7 C) 98 F (36.7 C)  TempSrc: Oral  Oral Oral  SpO2: 99% 100% 99% 100%  Weight:  63.5 kg (139 lb 15.9 oz)    Height:        Intake/Output Summary (Last 24 hours) at 02/09/2018 1437 Last data filed at 02/09/2018 1431 Gross per 24 hour  Intake 1445 ml  Output 200 ml  Net 1245 ml   Filed Weights   02/08/18 0604 02/08/18 2151  Weight: 63  kg (138 lb 14.2 oz) 63.5 kg (139 lb 15.9 oz)    Examination: General exam: Awake, laying in bed, in nad Respiratory system: Normal respiratory effort, no wheezing Cardiovascular system: regular rate, s1, s2 Gastrointestinal system: Soft, nondistended, positive BS Central nervous system: CN2-12 grossly intact, strength intact Extremities: Perfused, no clubbing Skin: Normal skin turgor, no notable skin lesions seen Psychiatry: Mood normal // no visual hallucinations   Data Reviewed: I have personally reviewed following labs and imaging studies  CBC: Recent Labs  Lab 02/07/18 2018 02/08/18 0433  WBC 8.0 6.0    NEUTROABS 5.5  --   HGB 11.3* 11.0*  HCT 33.2* 32.3*  MCV 98.2 97.6  PLT 327 282   Basic Metabolic Panel: Recent Labs  Lab 02/07/18 2018 02/08/18 0433 02/09/18 0656  NA 130* 138 140  K 3.2* 4.0 4.2  CL 95* 102 102  CO2 24 28 32  GLUCOSE 64* 88 86  BUN 11 9 <5*  CREATININE 0.71 0.56 0.54  CALCIUM 8.5* 8.5* 9.1  MG  --  1.7  --    GFR: Estimated Creatinine Clearance: 69.8 mL/min (by C-G formula based on SCr of 0.54 mg/dL). Liver Function Tests: Recent Labs  Lab 02/08/18 0433  AST 62*  ALT 30  ALKPHOS 80  BILITOT 0.9  PROT 5.5*  ALBUMIN 3.1*   No results for input(s): LIPASE, AMYLASE in the last 168 hours. No results for input(s): AMMONIA in the last 168 hours. Coagulation Profile: No results for input(s): INR, PROTIME in the last 168 hours. Cardiac Enzymes: Recent Labs  Lab 02/07/18 2029 02/08/18 0433 02/08/18 1008 02/08/18 2119  TROPONINI <0.03 <0.03 <0.03 <0.03   BNP (last 3 results) No results for input(s): PROBNP in the last 8760 hours. HbA1C: No results for input(s): HGBA1C in the last 72 hours. CBG: Recent Labs  Lab 02/07/18 2338  GLUCAP 64*   Lipid Profile: No results for input(s): CHOL, HDL, LDLCALC, TRIG, CHOLHDL, LDLDIRECT in the last 72 hours. Thyroid Function Tests: No results for input(s): TSH, T4TOTAL, FREET4, T3FREE, THYROIDAB in the last 72 hours. Anemia Panel: No results for input(s): VITAMINB12, FOLATE, FERRITIN, TIBC, IRON, RETICCTPCT in the last 72 hours. Sepsis Labs: No results for input(s): PROCALCITON, LATICACIDVEN in the last 168 hours.  No results found for this or any previous visit (from the past 240 hour(s)).   Radiology Studies: Ct Head Wo Contrast  Result Date: 02/07/2018 CLINICAL DATA:  Pt BIB [facility] after a near syncopal episode and fall tonight. Pt can recall the whole incident, was leaving a restaurant when she felt dizzy, pt denies LOC, did hit her head EXAM: CT HEAD WITHOUT CONTRAST CT CERVICAL SPINE WITHOUT  CONTRAST TECHNIQUE: Multidetector CT imaging of the head and cervical spine was performed following the standard protocol without intravenous contrast. Multiplanar CT image reconstructions of the cervical spine were also generated. COMPARISON:  08/04/2017 FINDINGS: CT HEAD FINDINGS Brain: No evidence of acute infarction, hemorrhage, hydrocephalus, extra-axial collection or mass lesion/mass effect. There is ventricular and sulcal enlargement reflecting atrophy advanced for age. Patchy white matter hypoattenuation is noted bilaterally consistent with mild to moderate chronic microvascular ischemic change. Vascular: No hyperdense vessel or unexpected calcification. Skull: Normal. Negative for fracture or focal lesion. Sinuses/Orbits: Globes and orbits are unremarkable. Sinuses and mastoid air cells are clear. Other: None. CT CERVICAL SPINE FINDINGS Alignment: Normal. Skull base and vertebrae: No acute fracture. No primary bone lesion or focal pathologic process. Soft tissues and spinal canal: No prevertebral fluid or swelling.  No visible canal hematoma. Disc levels: Moderate loss of disc height at C5-C6, C6-C7 and C7-T1. Mild spondylotic disc bulging and endplate spurring at these levels. No convincing disc herniation. Facet degenerative changes are noted, greater on the right. Upper chest: No acute findings. No masses or enlarged lymph nodes. Clear lung apices. Other: None. IMPRESSION: HEAD CT 1. No acute intracranial abnormalities. 2. Atrophy advanced for age. Mild to moderate chronic microvascular ischemic change. CERVICAL CT 1. No fracture or acute finding. Electronically Signed   By: Amie Portland M.D.   On: 02/07/2018 20:57   Ct Cervical Spine Wo Contrast  Result Date: 02/07/2018 CLINICAL DATA:  Pt BIB [facility] after a near syncopal episode and fall tonight. Pt can recall the whole incident, was leaving a restaurant when she felt dizzy, pt denies LOC, did hit her head EXAM: CT HEAD WITHOUT CONTRAST CT CERVICAL  SPINE WITHOUT CONTRAST TECHNIQUE: Multidetector CT imaging of the head and cervical spine was performed following the standard protocol without intravenous contrast. Multiplanar CT image reconstructions of the cervical spine were also generated. COMPARISON:  08/04/2017 FINDINGS: CT HEAD FINDINGS Brain: No evidence of acute infarction, hemorrhage, hydrocephalus, extra-axial collection or mass lesion/mass effect. There is ventricular and sulcal enlargement reflecting atrophy advanced for age. Patchy white matter hypoattenuation is noted bilaterally consistent with mild to moderate chronic microvascular ischemic change. Vascular: No hyperdense vessel or unexpected calcification. Skull: Normal. Negative for fracture or focal lesion. Sinuses/Orbits: Globes and orbits are unremarkable. Sinuses and mastoid air cells are clear. Other: None. CT CERVICAL SPINE FINDINGS Alignment: Normal. Skull base and vertebrae: No acute fracture. No primary bone lesion or focal pathologic process. Soft tissues and spinal canal: No prevertebral fluid or swelling. No visible canal hematoma. Disc levels: Moderate loss of disc height at C5-C6, C6-C7 and C7-T1. Mild spondylotic disc bulging and endplate spurring at these levels. No convincing disc herniation. Facet degenerative changes are noted, greater on the right. Upper chest: No acute findings. No masses or enlarged lymph nodes. Clear lung apices. Other: None. IMPRESSION: HEAD CT 1. No acute intracranial abnormalities. 2. Atrophy advanced for age. Mild to moderate chronic microvascular ischemic change. CERVICAL CT 1. No fracture or acute finding. Electronically Signed   By: Amie Portland M.D.   On: 02/07/2018 20:57   Dg Chest Port 1 View  Result Date: 02/07/2018 CLINICAL DATA:  Pt BIB [facility] after a near syncopal episode and fall tonight. Pt can recall the whole incident, was leaving a restaurant when she felt dizzy, pt denies LOC, did hit her head. Pt reports that she fell on Tuesday,  hitting her head then as well. AANDO x 4 EXAM: PORTABLE CHEST 1 VIEW COMPARISON:  12/14/2017 FINDINGS: Cardiac silhouette is normal in size. No mediastinal or hilar masses. No evidence of adenopathy. Clear lungs.  No pleural effusion or pneumothorax. Bilateral old healed rib fractures. Skeletal structures are diffusely demineralized. IMPRESSION: No acute cardiopulmonary disease. Electronically Signed   By: Amie Portland M.D.   On: 02/07/2018 20:51    Scheduled Meds: . buPROPion  300 mg Oral Daily  . DULoxetine  60 mg Oral Daily  . losartan  50 mg Oral Daily  . multivitamin with minerals  1 tablet Oral Daily  . pantoprazole  40 mg Oral Daily  . protein supplement shake  11 oz Oral TID BM  . venlafaxine XR  75 mg Oral Q breakfast   Continuous Infusions: . cefTRIAXone (ROCEPHIN)  IV 1 g (02/08/18 2140)  . lactated ringers 75 mL/hr  at 02/09/18 1214     LOS: 0 days   Rickey Barbara, MD [facility] Pager [phone]  If 7PM-7AM, please contact night-coverage [email or web address] Password [email or web address] 02/09/2018, 2:37 PM

## 2018-02-10 ENCOUNTER — Ambulatory Visit (HOSPITAL_COMMUNITY): Payer: Federal, State, Local not specified - PPO

## 2018-02-10 ENCOUNTER — Observation Stay (HOSPITAL_COMMUNITY): Payer: Federal, State, Local not specified - PPO

## 2018-02-10 DIAGNOSIS — E860 Dehydration: Secondary | ICD-10-CM | POA: Diagnosis present

## 2018-02-10 DIAGNOSIS — Z79899 Other long term (current) drug therapy: Secondary | ICD-10-CM | POA: Diagnosis not present

## 2018-02-10 DIAGNOSIS — R197 Diarrhea, unspecified: Secondary | ICD-10-CM | POA: Diagnosis not present

## 2018-02-10 DIAGNOSIS — Y9269 Other specified industrial and construction area as the place of occurrence of the external cause: Secondary | ICD-10-CM | POA: Diagnosis not present

## 2018-02-10 DIAGNOSIS — Z886 Allergy status to analgesic agent status: Secondary | ICD-10-CM | POA: Diagnosis not present

## 2018-02-10 DIAGNOSIS — I951 Orthostatic hypotension: Secondary | ICD-10-CM | POA: Diagnosis not present

## 2018-02-10 DIAGNOSIS — Z9884 Bariatric surgery status: Secondary | ICD-10-CM | POA: Diagnosis not present

## 2018-02-10 DIAGNOSIS — F329 Major depressive disorder, single episode, unspecified: Secondary | ICD-10-CM | POA: Diagnosis present

## 2018-02-10 DIAGNOSIS — Z87891 Personal history of nicotine dependence: Secondary | ICD-10-CM | POA: Diagnosis not present

## 2018-02-10 DIAGNOSIS — H9190 Unspecified hearing loss, unspecified ear: Secondary | ICD-10-CM | POA: Diagnosis present

## 2018-02-10 DIAGNOSIS — N390 Urinary tract infection, site not specified: Secondary | ICD-10-CM | POA: Diagnosis not present

## 2018-02-10 DIAGNOSIS — R55 Syncope and collapse: Secondary | ICD-10-CM | POA: Diagnosis not present

## 2018-02-10 DIAGNOSIS — E162 Hypoglycemia, unspecified: Secondary | ICD-10-CM | POA: Diagnosis present

## 2018-02-10 DIAGNOSIS — I10 Essential (primary) hypertension: Secondary | ICD-10-CM | POA: Diagnosis not present

## 2018-02-10 DIAGNOSIS — G35 Multiple sclerosis: Secondary | ICD-10-CM | POA: Diagnosis not present

## 2018-02-10 DIAGNOSIS — J45909 Unspecified asthma, uncomplicated: Secondary | ICD-10-CM | POA: Diagnosis present

## 2018-02-10 DIAGNOSIS — W19XXXA Unspecified fall, initial encounter: Secondary | ICD-10-CM | POA: Diagnosis present

## 2018-02-10 DIAGNOSIS — E876 Hypokalemia: Secondary | ICD-10-CM | POA: Diagnosis present

## 2018-02-10 DIAGNOSIS — S0003XA Contusion of scalp, initial encounter: Secondary | ICD-10-CM | POA: Diagnosis not present

## 2018-02-10 DIAGNOSIS — R402413 Glasgow coma scale score 13-15, at hospital admission: Secondary | ICD-10-CM | POA: Diagnosis present

## 2018-02-10 DIAGNOSIS — E871 Hypo-osmolality and hyponatremia: Secondary | ICD-10-CM | POA: Diagnosis present

## 2018-02-10 LAB — BASIC METABOLIC PANEL
ANION GAP: 7 (ref 5–15)
BUN: 8 mg/dL (ref 6–20)
CALCIUM: 8.7 mg/dL — AB (ref 8.9–10.3)
CO2: 30 mmol/L (ref 22–32)
CREATININE: 0.46 mg/dL (ref 0.44–1.00)
Chloride: 101 mmol/L (ref 101–111)
GFR calc Af Amer: >60 mL/min (ref >60)
GFR calc non Af Amer: >60 mL/min (ref >60)
GLUCOSE: 86 mg/dL (ref 65–99)
Potassium: 4.9 mmol/L (ref 3.5–5.1)
Sodium: 138 mmol/L (ref 135–145)

## 2018-02-10 LAB — URINE CULTURE: CULTURE: NO GROWTH

## 2018-02-10 MED ORDER — LACTATED RINGERS IV BOLUS
500.0000 mL | Freq: Once | INTRAVENOUS | Status: AC
  Administered 2018-02-10: 500 mL via INTRAVENOUS

## 2018-02-10 MED ORDER — HYDRALAZINE HCL 20 MG/ML IJ SOLN: 5.0000 mg | INTRAMUSCULAR | Status: DC | PRN

## 2018-02-10 NOTE — Plan of Care (Signed)
  Problem: Education: Goal: Knowledge of General Education information will improve Outcome: Progressing   Problem: Health Behavior/Discharge Planning: Goal: Ability to manage health-related needs will improve Outcome: Progressing   Problem: Clinical Measurements: Goal: Ability to maintain clinical measurements within normal limits will improve Outcome: Progressing Goal: Will remain free from infection Outcome: Progressing Goal: Diagnostic test results will improve Outcome: Progressing Goal: Respiratory complications will improve Outcome: Progressing Goal: Cardiovascular complication will be avoided Outcome: Progressing   Problem: Activity: Goal: Risk for activity intolerance will decrease Outcome: Progressing   Problem: Coping: Goal: Level of anxiety will decrease Outcome: Progressing   Problem: Elimination: Goal: Will not experience complications related to bowel motility Outcome: Progressing Goal: Will not experience complications related to urinary retention Outcome: Progressing   Problem: Pain Managment: Goal: General experience of comfort will improve Outcome: Progressing   Problem: Safety: Goal: Ability to remain free from injury will improve Outcome: Progressing   Problem: Skin Integrity: Goal: Risk for impaired skin integrity will decrease Outcome: Progressing   

## 2018-02-10 NOTE — Progress Notes (Signed)
PROGRESS NOTE    Catherine Hubbard  ZDG:[id number] DOB: 03-31-1961 DOA: 02/07/2018 PCP: Patient, No Pcp Per    Brief Narrative:  57 y.o. female with history of multiple sclerosis, hypertension, hearing loss presents to the ER after patient had a syncopal episode.  Patient states she was at her workplace and was about to come home when she suddenly lost consciousness without any prodrome.  Denies any palpitation chest pain shortness of breath.  Patient states she has been having some diarrhea about 3 episodes every day for last 3 weeks.  Denies any abdominal pain or nausea vomiting.  Patient states she may have lost consciousness for about a minute but not sure.  Did not have any incontinence of urine or tongue bite.  ED Course: In the ER on exam patient has scalp hematoma.  CT head and C-spine did not show anything acute.  EKG shows normal sinus rhythm with LVH and QTC of 482 ms.  UA shows possibility of UTI.  On exam patient appears nonfocal.  Patient admitted for further work-up of syncope  Assessment & Plan:   Principal Problem:   Syncope Active Problems:   Multiple sclerosis (HCC)   Gastric bypass status for obesity   Essential hypertension   Hypokalemia   Diarrhea   Acute lower UTI   Hematoma of scalp  1. Orthostatic Syncope  1. Remains orthostatic this morning despite basal IVF overnight (see vital signs) 2. Ordered 500cc LR bolus x1 followed by increasing basal fluid rate to 100cc/hr 3. Skin turgor is poor. Patient reports dizziness with standing 4. Will continue with above hydration 5. Repeat orthostatics in AM 6. Patient with several week hx of diarrhea, likely etiology of dehydration 7. Will repeat bmet in AM 2. Hypokalemia from diarrhea.   1. Normalized   2. Follow bmet 3. Diarrhea  1. Patient stable thus far 2. No further episodes since current hospital visit 4. Hypertension 1. Will continue on cozaar as tolerated 2. BP remains stable 5. History of  depression  1. Will continue on Cymbalta Effexor Wellbutrin as tolerated. 6. History of multiple sclerosis on Orcelizumab.   1. Patient is followed by Dr. Epimenio Foot as outpatient. 2. Currently stable  DVT prophylaxis: SCD's Code Status: Full Family Communication: Pt in room, family at bedside Disposition Plan: Possible d/c in 24-48hrs when no longer orthostatic  Consultants:     Procedures:     Antimicrobials: Anti-infectives (From admission, onward)   Start     Dose/Rate Route Frequency Ordered Stop   02/08/18 0345  cefTRIAXone (ROCEPHIN) 1 g in sodium chloride 0.9 % 100 mL IVPB     1 g 200 mL/hr over 30 Minutes Intravenous Daily at bedtime 02/08/18 0249        Subjective: Reports feeling dizzy when standing  Objective: Vitals:   02/09/18 1606 02/09/18 2136 02/10/18 0326 02/10/18 0747  BP: (!) 151/101 (!) 124/98 120/84 (!) 128/95  Pulse: 90 93 86 77  Resp: 18 18 17 18   Temp: 98.3 F (36.8 C) 98.1 F (36.7 C) 98 F (36.7 C)   TempSrc:  Oral Oral   SpO2: 100% 100% 99% 99%  Weight:  62.2 kg (137 lb 2 oz)    Height:        Intake/Output Summary (Last 24 hours) at 02/10/2018 1521 Last data filed at 02/10/2018 5956 Gross per 24 hour  Intake 1331.63 ml  Output -  Net 1331.63 ml   Filed Weights   02/08/18 0604 02/08/18 2151 02/09/18 2136  Weight: 63 kg (138 lb 14.2 oz) 63.5 kg (139 lb 15.9 oz) 62.2 kg (137 lb 2 oz)    Examination: General exam: Conversant, in no acute distress Respiratory system: normal chest rise, clear, no audible wheezing Cardiovascular system: regular rhythm, s1-s2 Gastrointestinal system: Nondistended, nontender, pos BS Central nervous system: No seizures, no tremors Extremities: No cyanosis, no joint deformities Skin: No rashes, no pallor, decreased skin turgor Psychiatry: Affect normal // no auditory hallucinations    Data Reviewed: I have personally reviewed following labs and imaging studies  CBC: Recent Labs  Lab 02/07/18 2018  02/08/18 0433  WBC 8.0 6.0  NEUTROABS 5.5  --   HGB 11.3* 11.0*  HCT 33.2* 32.3*  MCV 98.2 97.6  PLT 327 282   Basic Metabolic Panel: Recent Labs  Lab 02/07/18 2018 02/08/18 0433 02/09/18 0656 02/10/18 0356  NA 130* 138 140 138  K 3.2* 4.0 4.2 4.9  CL 95* 102 102 101  CO2 24 28 32 30  GLUCOSE 64* 88 86 86  BUN 11 9 <5* 8  CREATININE 0.71 0.56 0.54 0.46  CALCIUM 8.5* 8.5* 9.1 8.7*  MG  --  1.7  --   --    GFR: Estimated Creatinine Clearance: 69.8 mL/min (by C-G formula based on SCr of 0.46 mg/dL). Liver Function Tests: Recent Labs  Lab 02/08/18 0433  AST 62*  ALT 30  ALKPHOS 80  BILITOT 0.9  PROT 5.5*  ALBUMIN 3.1*   No results for input(s): LIPASE, AMYLASE in the last 168 hours. No results for input(s): AMMONIA in the last 168 hours. Coagulation Profile: No results for input(s): INR, PROTIME in the last 168 hours. Cardiac Enzymes: Recent Labs  Lab 02/07/18 2029 02/08/18 0433 02/08/18 1008 02/08/18 2119  TROPONINI <0.03 <0.03 <0.03 <0.03   BNP (last 3 results) No results for input(s): PROBNP in the last 8760 hours. HbA1C: No results for input(s): HGBA1C in the last 72 hours. CBG: Recent Labs  Lab 02/07/18 2338  GLUCAP 64*   Lipid Profile: No results for input(s): CHOL, HDL, LDLCALC, TRIG, CHOLHDL, LDLDIRECT in the last 72 hours. Thyroid Function Tests: No results for input(s): TSH, T4TOTAL, FREET4, T3FREE, THYROIDAB in the last 72 hours. Anemia Panel: No results for input(s): VITAMINB12, FOLATE, FERRITIN, TIBC, IRON, RETICCTPCT in the last 72 hours. Sepsis Labs: No results for input(s): PROCALCITON, LATICACIDVEN in the last 168 hours.  Recent Results (from the past 240 hour(s))  Culture, Urine     Status: None   Collection Time: 02/09/18  8:45 AM  Result Value Ref Range Status   Specimen Description URINE, RANDOM  Final   Special Requests NONE  Final   Culture   Final    NO GROWTH Performed at [facility], [address]  [place], [place] [address]    Report Status 02/10/2018 FINAL  Final     Radiology Studies: No results found.  Scheduled Meds: . buPROPion  300 mg Oral Daily  . DULoxetine  60 mg Oral Daily  . losartan  50 mg Oral Daily  . multivitamin with minerals  1 tablet Oral Daily  . pantoprazole  40 mg Oral Daily  . protein supplement shake  11 oz Oral TID BM  . venlafaxine XR  75 mg Oral Q breakfast   Continuous Infusions: . cefTRIAXone (ROCEPHIN)  IV Stopped (02/09/18 2124)  . lactated ringers 100 mL/hr at 02/10/18 1308     LOS: 0 days   Rickey Barbara, MD [facility] Pager [phone]  If 7PM-7AM, please contact night-coverage [email or web address] Password [email or web address] 02/10/2018, 3:21 PM

## 2018-02-11 ENCOUNTER — Inpatient Hospital Stay (HOSPITAL_COMMUNITY): Payer: Federal, State, Local not specified - PPO

## 2018-02-11 DIAGNOSIS — R55 Syncope and collapse: Secondary | ICD-10-CM

## 2018-02-11 LAB — ECHOCARDIOGRAM COMPLETE
Height: 65 in
WEIGHTICAEL: 2194.02 [oz_av]

## 2018-02-11 LAB — CORTISOL: Cortisol, Plasma: 13.5 ug/dL

## 2018-02-11 LAB — BASIC METABOLIC PANEL
ANION GAP: 9 (ref 5–15)
BUN: <5 mg/dL — ABNORMAL LOW (ref 6–20)
CHLORIDE: 98 mmol/L (ref 98–111)
CO2: 33 mmol/L — AB (ref 22–32)
Calcium: 9 mg/dL (ref 8.9–10.3)
Creatinine, Ser: 0.48 mg/dL (ref 0.44–1.00)
GFR calc Af Amer: >60 mL/min (ref >60)
GFR calc non Af Amer: >60 mL/min (ref >60)
GLUCOSE: 89 mg/dL (ref 70–99)
Potassium: 3.9 mmol/L (ref 3.5–5.1)
Sodium: 140 mmol/L (ref 135–145)

## 2018-02-11 LAB — TSH: TSH: 1.83 u[IU]/mL (ref 0.350–4.500)

## 2018-02-11 NOTE — Care Management Note (Signed)
Case Management Note  Patient Details  Name: MAESA PAINE MRN: [id number] Date of Birth: [birth year]  Subjective/Objective:                 Patient from home admitted w syncope. Referral placed to OP PT, neuro rehab center. Instructions provided on AVS for patient to contact insurance for providers who are accepting new patients.    Action/Plan:   Expected Discharge Date:                  Expected Discharge Plan:  Home/Self Care  In-House Referral:     Discharge planning Services  CM Consult  Post Acute Care Choice:    Choice offered to:     DME Arranged:    DME Agency:     HH Arranged:    HH Agency:     Status of Service:  Completed, signed off  If discussed at [employer] of Stay Meetings, dates discussed:    Additional Comments:  Lawerance Sabal, RN 02/11/2018, 8:27 AM

## 2018-02-11 NOTE — Progress Notes (Signed)
PROGRESS NOTE    Catherine Hubbard  CZY:[id number] DOB: Dec 13, 1960 DOA: 02/07/2018 PCP: Patient, No Pcp Per    Brief Narrative:  57 y.o. female with history of multiple sclerosis, hypertension, hearing loss presents to the ER after patient had a syncopal episode.  Patient states she was at her workplace and was about to come home when she suddenly lost consciousness without any prodrome.  Denies any palpitation chest pain shortness of breath.  Patient states she has been having some diarrhea about 3 episodes every day for last 3 weeks.  Denies any abdominal pain or nausea vomiting.  Patient states she may have lost consciousness for about a minute but not sure.  Did not have any incontinence of urine or tongue bite.  ED Course: In the ER on exam patient has scalp hematoma.  CT head and C-spine did not show anything acute.  EKG shows normal sinus rhythm with LVH and QTC of 482 ms.  UA shows possibility of UTI.  On exam patient appears nonfocal.  Patient admitted for further work-up of syncope  Assessment & Plan:   Principal Problem:   Syncope Active Problems:   Multiple sclerosis (HCC)   Gastric bypass status for obesity   Essential hypertension   Hypokalemia   Diarrhea   Acute lower UTI   Hematoma of scalp  1. Orthostatic Syncope  1. Continues to be orthostatic this morning despite continued IVF 2. Pt is continued on basal ivf and is s/p 500cc bolus yesterday 3. Skin turgor appears improved although mucus membranes are dry. Patient complains of increased thirst 4. Patient with several week hx of diarrhea, likely etiology of dehydration 5. Recheck bmet and orthostatics in AM 2. Hypokalemia from diarrhea.   1. Normalized   2. Continue to follow bmet 3. Diarrhea  1. Patient stable thus far 2. No further episodes since admission 4. Hypertension 1. Will continue on cozaar as tolerated 2. BP presently stable 5. History of depression  1. Will continue on Cymbalta Effexor  Wellbutrin as tolerated. 6. History of multiple sclerosis on Orcelizumab.   1. Patient is followed by Dr. Epimenio Foot as outpatient. 2. Currently stable  DVT prophylaxis: SCD's Code Status: Full Family Communication: Pt in room, family not at bedside Disposition Plan: Possible d/c in 24-48hrs when no longer orthostatic  Consultants:     Procedures:     Antimicrobials: Anti-infectives (From admission, onward)   Start     Dose/Rate Route Frequency Ordered Stop   02/08/18 0345  cefTRIAXone (ROCEPHIN) 1 g in sodium chloride 0.9 % 100 mL IVPB     1 g 200 mL/hr over 30 Minutes Intravenous Daily at bedtime 02/08/18 0249        Subjective: Reports feeling thirsty, dizzy when standing  Objective: Vitals:   02/10/18 1614 02/10/18 2033 02/11/18 0501 02/11/18 0829  BP: (!) 155/113 (!) 155/100 134/81 (!) 146/106  Pulse: 80 79 76 81  Resp: 18 16 16 18   Temp: 98.4 F (36.9 C) 98 F (36.7 C) 98.1 F (36.7 C) 97.9 F (36.6 C)  TempSrc: Oral Oral Oral Oral  SpO2: 100% 100% 100% 100%  Weight:      Height:        Intake/Output Summary (Last 24 hours) at 02/11/2018 1323 Last data filed at 02/11/2018 1221 Gross per 24 hour  Intake 1604.4 ml  Output 300 ml  Net 1304.4 ml   Filed Weights   02/08/18 0604 02/08/18 2151 02/09/18 2136  Weight: 63 kg (138 lb 14.2 oz)  63.5 kg (139 lb 15.9 oz) 62.2 kg (137 lb 2 oz)    Examination: General exam: Awake, laying in bed, in nad, dry mucus membranes Respiratory system: Normal respiratory effort, no wheezing Cardiovascular system: regular rate, s1, s2 Gastrointestinal system: Soft, nondistended, positive BS Central nervous system: CN2-12 grossly intact, strength intact Extremities: Perfused, no clubbing Skin: Normal skin turgor, no notable skin lesions seen Psychiatry: Mood normal // no visual hallucinations    Data Reviewed: I have personally reviewed following labs and imaging studies  CBC: Recent Labs  Lab 02/07/18 2018  02/08/18 0433  WBC 8.0 6.0  NEUTROABS 5.5  --   HGB 11.3* 11.0*  HCT 33.2* 32.3*  MCV 98.2 97.6  PLT 327 282   Basic Metabolic Panel: Recent Labs  Lab 02/07/18 2018 02/08/18 0433 02/09/18 0656 02/10/18 0356 02/11/18 0340  NA 130* 138 140 138 140  K 3.2* 4.0 4.2 4.9 3.9  CL 95* 102 102 101 98  CO2 24 28 32 30 33*  GLUCOSE 64* 88 86 86 89  BUN 11 9 <5* 8 <5*  CREATININE 0.71 0.56 0.54 0.46 0.48  CALCIUM 8.5* 8.5* 9.1 8.7* 9.0  MG  --  1.7  --   --   --    GFR: Estimated Creatinine Clearance: 69.8 mL/min (by C-G formula based on SCr of 0.48 mg/dL). Liver Function Tests: Recent Labs  Lab 02/08/18 0433  AST 62*  ALT 30  ALKPHOS 80  BILITOT 0.9  PROT 5.5*  ALBUMIN 3.1*   No results for input(s): LIPASE, AMYLASE in the last 168 hours. No results for input(s): AMMONIA in the last 168 hours. Coagulation Profile: No results for input(s): INR, PROTIME in the last 168 hours. Cardiac Enzymes: Recent Labs  Lab 02/07/18 2029 02/08/18 0433 02/08/18 1008 02/08/18 2119  TROPONINI <0.03 <0.03 <0.03 <0.03   BNP (last 3 results) No results for input(s): PROBNP in the last 8760 hours. HbA1C: No results for input(s): HGBA1C in the last 72 hours. CBG: Recent Labs  Lab 02/07/18 2338  GLUCAP 64*   Lipid Profile: No results for input(s): CHOL, HDL, LDLCALC, TRIG, CHOLHDL, LDLDIRECT in the last 72 hours. Thyroid Function Tests: Recent Labs    02/11/18 1203  TSH 1.830   Anemia Panel: No results for input(s): VITAMINB12, FOLATE, FERRITIN, TIBC, IRON, RETICCTPCT in the last 72 hours. Sepsis Labs: No results for input(s): PROCALCITON, LATICACIDVEN in the last 168 hours.  Recent Results (from the past 240 hour(s))  Culture, Urine     Status: None   Collection Time: 02/09/18  8:45 AM  Result Value Ref Range Status   Specimen Description URINE, RANDOM  Final   Special Requests NONE  Final   Culture   Final    NO GROWTH Performed at [facility], [address] [place], [place] [address]    Report Status 02/10/2018 FINAL  Final     Radiology Studies: No results found.  Scheduled Meds: . buPROPion  300 mg Oral Daily  . DULoxetine  60 mg Oral Daily  . losartan  50 mg Oral Daily  . multivitamin with minerals  1 tablet Oral Daily  . pantoprazole  40 mg Oral Daily  . protein supplement shake  11 oz Oral TID BM  . venlafaxine XR  75 mg Oral Q breakfast   Continuous Infusions: . cefTRIAXone (ROCEPHIN)  IV 1 g (02/10/18 2105)  . lactated ringers 100 mL/hr at 02/11/18 1221     LOS: 1 day  Rickey Barbara, MD [facility] Pager [phone]  If 7PM-7AM, please contact night-coverage [email or web address] Password [email or web address] 02/11/2018, 1:23 PM

## 2018-02-11 NOTE — Progress Notes (Signed)
  Echocardiogram 2D Echocardiogram has been performed.  Catherine Hubbard 02/11/2018, 9:05 AM

## 2018-02-12 DIAGNOSIS — N390 Urinary tract infection, site not specified: Secondary | ICD-10-CM

## 2018-02-12 DIAGNOSIS — R55 Syncope and collapse: Secondary | ICD-10-CM

## 2018-02-12 DIAGNOSIS — R197 Diarrhea, unspecified: Secondary | ICD-10-CM

## 2018-02-12 DIAGNOSIS — E876 Hypokalemia: Secondary | ICD-10-CM

## 2018-02-12 DIAGNOSIS — I951 Orthostatic hypotension: Secondary | ICD-10-CM

## 2018-02-12 LAB — BASIC METABOLIC PANEL
ANION GAP: 7 (ref 5–15)
BUN: 6 mg/dL (ref 6–20)
CALCIUM: 9.1 mg/dL (ref 8.9–10.3)
CO2: 33 mmol/L — ABNORMAL HIGH (ref 22–32)
Chloride: 102 mmol/L (ref 98–111)
Creatinine, Ser: 0.51 mg/dL (ref 0.44–1.00)
GFR calc Af Amer: >60 mL/min (ref >60)
GLUCOSE: 84 mg/dL (ref 70–99)
POTASSIUM: 4.1 mmol/L (ref 3.5–5.1)
Sodium: 142 mmol/L (ref 135–145)

## 2018-02-12 NOTE — Discharge Summary (Signed)
Physician Discharge Summary  MABEL ROLL ZOX:[id number] DOB: 01/08/1961 DOA: 02/07/2018  PCP: Patient, No Pcp Per  Admit date: 02/07/2018 Discharge date: 02/12/2018  Admitted From: home Disposition:  home  Recommendations for Outpatient Follow-up:  -Follow up with neurology in 2 weeks.  Please evaluate adjusting dose for Provigil or discontinuing it if patient still orthostatic during outpatient visit. -Outpatient PT.  Home Health: Outpatient PT Equipment/Devices: None  Discharge Condition: Fair CODE STATUS: Full code Diet recommendation: Regular   Discharge Diagnoses:  Principal Problem:   Syncope due to orthostatic hypotension  Active Problems:   Multiple sclerosis (HCC)   Gastric bypass status for obesity   Essential hypertension   Hypokalemia   Diarrhea   Acute lower UTI   Hematoma of scalp  Brief narrative/HPI Please refer to admission H&P for details, in brief, [age]-year-old female with history of multiple sclerosis, hypertension, hearing loss presented after syncopal episode.  Patient suddenly lost consciousness while at work without a prodrome.  Denies any chest pain, shortness of breath, palpitations.  Reports having some diarrhea for the past 3 weeks of almost 3 episodes daily.  No bowel or urinary incontinence or tongue bite during the episode.  In the ED she was found to have a scalp hematoma CT of the head and cervical spine was negative for acute findings.  EKG unremarkable.  UA suggestive of UTI.  Patient was found to be significantly orthostatic and monitored.  Hospital course Orthostatic hypotension  Suspect due to dehydration with recent diarrhea and UTI.  Given IV hydration.  Still orthostatic but patient is asymptomatic.  Ambulated with PT who recommended outpatient therapy. We will discontinue her losartan which was started recently by her neurologist. TSH and a.m. cortisol were normal.  Patient is also on Modafinil  for fatigue which I will hold  since it can contribute to orthostasis.  2D echo with normal EF, no wall motion abnormality or valvular dysfunction. Encouraged hydration.  Should follow-up with her neurologist.  UTI Asymptomatic at present.  Received 5 days of IV Rocephin.  Diarrhea Now resolved.  Essential hypertension We will discontinue losartan given orthostasis.  Depression Continue Cymbalta, Effexor and Wellbutrin.  History of multiple sclerosis on orceluzimab Follows with Dr. Epimenio Foot.  Procedure: CT head, cervical spine and 2D echo  Consults: None Disposition: Home  Discharge Instructions  Discharge Instructions    Ambulatory referral to Physical Therapy   Complete by:  As directed      Allergies as of 02/12/2018      Reactions   Ibuprofen Other (See Comments)   Cannot take due to gastric bypass      Medication List    STOP taking these medications   diazepam 5 MG tablet Commonly known as:  VALIUM   losartan 50 MG tablet Commonly known as:  COZAAR modafinil 200 MG tablet Commonly known as:  PROVIGIL      TAKE these medications   albuterol 108 (90 Base) MCG/ACT inhaler Commonly known as:  PROVENTIL HFA;VENTOLIN HFA Inhale 2 puffs into the lungs every 6 (six) hours as needed for wheezing or shortness of breath.   buPROPion 300 MG 24 hr tablet Commonly known as:  WELLBUTRIN XL Take 1 tablet (300 mg total) by mouth daily.   cyclobenzaprine 10 MG tablet Commonly known as:  FLEXERIL Take 1 tablet (10 mg total) by mouth 2 (two) times daily as needed for muscle spasms.   DULoxetine 60 MG capsule Commonly known as:  CYMBALTA Take 1 capsule (60 mg total)  by mouth daily.   etodolac 400 MG tablet Commonly known as:  LODINE Take 1 tablet (400 mg total) by mouth 2 (two) times daily as needed. What changed:    when to take this  reasons to take this   HAIR/SKIN/NAILS/BIOTIN Tabs Take 2 tablets by mouth daily.   HYDROcodone-acetaminophen 5-325 MG tablet Commonly known as:   NORCO/VICODIN Take 0.5 tablets by mouth every 6 (six) hours as needed (pain).       multivitamin with minerals Tabs tablet Take 2 tablets by mouth daily.   ocrelizumab 600 mg in sodium chloride 0.9 % 500 mL Inject 600 mg into the vein every 6 (six) months. Last infusion May 2019 - due in December 2019   omeprazole 20 MG capsule Commonly known as:  PRILOSEC Take 40 mg by mouth 2 (two) times daily.   protein supplement shake Liqd Commonly known as:  PREMIER PROTEIN Take 325 mLs (11 oz total) by mouth 3 (three) times daily between meals.   venlafaxine XR 75 MG 24 hr capsule Commonly known as:  EFFEXOR-XR Take 75 mg by mouth daily with breakfast.      Follow-up Information    [facility] Follow up.   Why:  Call this number, the number on your insurance card, or your insurance website to be connected to a primary care provider in your area who is accepting new patients.  Contact information: [phone]       [facility] Follow up.   Specialty:  Rehabilitation Why:  They will be in contact with you in approx 5 business days. If you do not hear from them please call to schedule your follow up appointment. A referral has been placed for outpatient therapy. Contact information: [address] [id number] [place] [address] [phone]       Sater, Pearletha Furl, MD. Schedule an appointment as soon as possible for a visit in 2 week(s).   Specialty:  Neurology Contact information: [address] [place] [address] [phone]          Allergies  Allergen Reactions  . Ibuprofen Other (See Comments)    Cannot take due to gastric bypass      Procedures/Studies: Ct Head Wo Contrast  Result Date: 02/07/2018 CLINICAL DATA:  Pt BIB [facility] after a near syncopal episode and fall tonight. Pt can recall the whole incident, was leaving a restaurant when she felt dizzy, pt denies LOC, did hit her  head EXAM: CT HEAD WITHOUT CONTRAST CT CERVICAL SPINE WITHOUT CONTRAST TECHNIQUE: Multidetector CT imaging of the head and cervical spine was performed following the standard protocol without intravenous contrast. Multiplanar CT image reconstructions of the cervical spine were also generated. COMPARISON:  08/04/2017 FINDINGS: CT HEAD FINDINGS Brain: No evidence of acute infarction, hemorrhage, hydrocephalus, extra-axial collection or mass lesion/mass effect. There is ventricular and sulcal enlargement reflecting atrophy advanced for age. Patchy white matter hypoattenuation is noted bilaterally consistent with mild to moderate chronic microvascular ischemic change. Vascular: No hyperdense vessel or unexpected calcification. Skull: Normal. Negative for fracture or focal lesion. Sinuses/Orbits: Globes and orbits are unremarkable. Sinuses and mastoid air cells are clear. Other: None. CT CERVICAL SPINE FINDINGS Alignment: Normal. Skull base and vertebrae: No acute fracture. No primary bone lesion or focal pathologic process. Soft tissues and spinal canal: No prevertebral fluid or swelling. No visible canal hematoma. Disc levels: Moderate loss of disc height at C5-C6, C6-C7 and C7-T1. Mild spondylotic disc bulging and endplate spurring at these levels.  No convincing disc herniation. Facet degenerative changes are noted, greater on the right. Upper chest: No acute findings. No masses or enlarged lymph nodes. Clear lung apices. Other: None. IMPRESSION: HEAD CT 1. No acute intracranial abnormalities. 2. Atrophy advanced for age. Mild to moderate chronic microvascular ischemic change. CERVICAL CT 1. No fracture or acute finding. Electronically Signed   By: Amie Portland M.D.   On: 02/07/2018 20:57   Ct Cervical Spine Wo Contrast  Result Date: 02/07/2018 CLINICAL DATA:  Pt BIB [facility] after a near syncopal episode and fall tonight. Pt can recall the whole incident, was leaving a restaurant when she felt dizzy, pt denies LOC,  did hit her head EXAM: CT HEAD WITHOUT CONTRAST CT CERVICAL SPINE WITHOUT CONTRAST TECHNIQUE: Multidetector CT imaging of the head and cervical spine was performed following the standard protocol without intravenous contrast. Multiplanar CT image reconstructions of the cervical spine were also generated. COMPARISON:  08/04/2017 FINDINGS: CT HEAD FINDINGS Brain: No evidence of acute infarction, hemorrhage, hydrocephalus, extra-axial collection or mass lesion/mass effect. There is ventricular and sulcal enlargement reflecting atrophy advanced for age. Patchy white matter hypoattenuation is noted bilaterally consistent with mild to moderate chronic microvascular ischemic change. Vascular: No hyperdense vessel or unexpected calcification. Skull: Normal. Negative for fracture or focal lesion. Sinuses/Orbits: Globes and orbits are unremarkable. Sinuses and mastoid air cells are clear. Other: None. CT CERVICAL SPINE FINDINGS Alignment: Normal. Skull base and vertebrae: No acute fracture. No primary bone lesion or focal pathologic process. Soft tissues and spinal canal: No prevertebral fluid or swelling. No visible canal hematoma. Disc levels: Moderate loss of disc height at C5-C6, C6-C7 and C7-T1. Mild spondylotic disc bulging and endplate spurring at these levels. No convincing disc herniation. Facet degenerative changes are noted, greater on the right. Upper chest: No acute findings. No masses or enlarged lymph nodes. Clear lung apices. Other: None. IMPRESSION: HEAD CT 1. No acute intracranial abnormalities. 2. Atrophy advanced for age. Mild to moderate chronic microvascular ischemic change. CERVICAL CT 1. No fracture or acute finding. Electronically Signed   By: Amie Portland M.D.   On: 02/07/2018 20:57   Dg Chest Port 1 View  Result Date: 02/07/2018 CLINICAL DATA:  Pt BIB [facility] after a near syncopal episode and fall tonight. Pt can recall the whole incident, was leaving a restaurant when she felt dizzy, pt denies  LOC, did hit her head. Pt reports that she fell on Tuesday, hitting her head then as well. AANDO x 4 EXAM: PORTABLE CHEST 1 VIEW COMPARISON:  12/14/2017 FINDINGS: Cardiac silhouette is normal in size. No mediastinal or hilar masses. No evidence of adenopathy. Clear lungs.  No pleural effusion or pneumothorax. Bilateral old healed rib fractures. Skeletal structures are diffusely demineralized. IMPRESSION: No acute cardiopulmonary disease. Electronically Signed   By: Amie Portland M.D.   On: 02/07/2018 20:51    2D echo Study Conclusions  - Left ventricle: The cavity size was normal. Wall thickness was   increased in a pattern of moderate LVH. Systolic function was   vigorous. The estimated ejection fraction was in the range of 65%   to 70%. Wall motion was normal; there were no regional wall   motion abnormalities. Doppler parameters are consistent with   abnormal left ventricular relaxation (grade 1 diastolic   dysfunction). - Aortic valve: There was mild regurgitation. Valve area (VTI):   2.59 cm^2. Valve area (Vmax): 2.5 cm^2. Valve area (Vmean): 2.53   cm^2.   Subjective: Feels better.  Denies dizziness  or lightheadedness.  No overnight events.  Discharge Exam: Vitals:   02/12/18 0544 02/12/18 0755  BP: 128/84 126/80  Pulse: 74 70  Resp: 18 18  Temp: 98.5 F (36.9 C) 98.2 F (36.8 C)  SpO2: 97% 100%   Vitals:   02/11/18 1546 02/11/18 2157 02/12/18 0544 02/12/18 0755  BP: 137/88 (!) 149/101 128/84 126/80  Pulse: 84 87 74 70  Resp: 18 18 18 18   Temp: 97.8 F (36.6 C) 98.5 F (36.9 C) 98.5 F (36.9 C) 98.2 F (36.8 C)  TempSrc: Oral Oral Oral Oral  SpO2: 100% 99% 97% 100%  Weight:      Height:        General: Middle-aged female not in distress HEENT: Moist mucosa, supple neck Chest: Clear bilaterally CVS: Normal S1-S2, no murmurs GI: Soft, nondistended, nontender Musculoskeletal: Warm, no edema    The results of significant diagnostics from this  hospitalization (including imaging, microbiology, ancillary and laboratory) are listed below for reference.     Microbiology: Recent Results (from the past 240 hour(s))  Culture, Urine     Status: None   Collection Time: 02/09/18  8:45 AM  Result Value Ref Range Status   Specimen Description URINE, RANDOM  Final   Special Requests NONE  Final   Culture   Final    NO GROWTH Performed at [facility], [address] [place], [place] [address]    Report Status 02/10/2018 FINAL  Final     Labs: BNP (last 3 results) No results for input(s): BNP in the last 8760 hours. Basic Metabolic Panel: Recent Labs  Lab 02/08/18 0433 02/09/18 0656 02/10/18 0356 02/11/18 0340 02/12/18 0605  NA 138 140 138 140 142  K 4.0 4.2 4.9 3.9 4.1  CL 102 102 101 98 102  CO2 28 32 30 33* 33*  GLUCOSE 88 86 86 89 84  BUN 9 <5* 8 <5* 6  CREATININE 0.56 0.54 0.46 0.48 0.51  CALCIUM 8.5* 9.1 8.7* 9.0 9.1  MG 1.7  --   --   --   --    Liver Function Tests: Recent Labs  Lab 02/08/18 0433  AST 62*  ALT 30  ALKPHOS 80  BILITOT 0.9  PROT 5.5*  ALBUMIN 3.1*   No results for input(s): LIPASE, AMYLASE in the last 168 hours. No results for input(s): AMMONIA in the last 168 hours. CBC: Recent Labs  Lab 02/07/18 2018 02/08/18 0433  WBC 8.0 6.0  NEUTROABS 5.5  --   HGB 11.3* 11.0*  HCT 33.2* 32.3*  MCV 98.2 97.6  PLT 327 282   Cardiac Enzymes: Recent Labs  Lab 02/07/18 2029 02/08/18 0433 02/08/18 1008 02/08/18 2119  TROPONINI <0.03 <0.03 <0.03 <0.03   BNP: Invalid input(s): POCBNP CBG: Recent Labs  Lab 02/07/18 2338  GLUCAP 64*   D-Dimer No results for input(s): DDIMER in the last 72 hours. Hgb A1c No results for input(s): HGBA1C in the last 72 hours. Lipid Profile No results for input(s): CHOL, HDL, LDLCALC, TRIG, CHOLHDL, LDLDIRECT in the last 72 hours. Thyroid function studies Recent Labs    02/11/18 1203  TSH 1.830   Anemia work up No results for input(s):  VITAMINB12, FOLATE, FERRITIN, TIBC, IRON, RETICCTPCT in the last 72 hours. Urinalysis    Component Value Date/Time   COLORURINE YELLOW 02/07/2018 2019   APPEARANCEUR HAZY (A) 02/07/2018 2019   LABSPEC 1.006 02/07/2018 2019   PHURINE 5.0 02/07/2018 2019   GLUCOSEU NEGATIVE 02/07/2018 2019  HGBUR NEGATIVE 02/07/2018 2019   BILIRUBINUR NEGATIVE 02/07/2018 2019   KETONESUR NEGATIVE 02/07/2018 2019   PROTEINUR NEGATIVE 02/07/2018 2019   NITRITE NEGATIVE 02/07/2018 2019   LEUKOCYTESUR TRACE (A) 02/07/2018 2019   Sepsis Labs Invalid input(s): PROCALCITONIN,  WBC,  LACTICIDVEN Microbiology Recent Results (from the past 240 hour(s))  Culture, Urine     Status: None   Collection Time: 02/09/18  8:45 AM  Result Value Ref Range Status   Specimen Description URINE, RANDOM  Final   Special Requests NONE  Final   Culture   Final    NO GROWTH Performed at [facility], [address] [place], [place] [address]    Report Status 02/10/2018 FINAL  Final     Time coordinating discharge: <30 minutes  SIGNED:   Eddie North, MD  [facility] 02/12/2018, 12:01 PM Pager   If 7PM-7AM, please contact night-coverage [email or web address] Password [email or web address]

## 2018-02-12 NOTE — Progress Notes (Signed)
Discharge instructions given.  Pt verbalized understanding.  VSS.  Denies pain.  Pt waiting for transportation home.

## 2018-02-12 NOTE — Progress Notes (Signed)
Physical Therapy Treatment Patient Details Name: Catherine Hubbard MRN: [id number] DOB: [birth year] Today's Date: 02/12/2018    History of Present Illness Pt is a 57 y.o. F with significant PMH of multiple sclerosis, hypertension, hearing loss who presents following a syncopal episode. States she has been having some diarrhea about 3 episodes every day for last 3 weeks.    PT Comments    Patient progressing towards goals, although still remains orthostatic (orthostatics listed below). Denies dizziness but does state she has "blurry vision" and diplopia which she associated with multiple sclerosis symptoms. Session focused on continued high level, dynamic gait activities including tandem walking and varying gait speeds. Requiring mostly supervision for safety. Encouraged patient to sit up in recliner for extended period, as patient reports she has been mostly lying in bed since admission.  Orthostatic vital signs:  Supine: 129/88, HR 76 Sitting: 108/86, HR 85 Standing: 96/82, HR 99   Follow Up Recommendations  Outpatient PT;Supervision for mobility/OOB     Equipment Recommendations  None recommended by PT    Recommendations for Other Services       Precautions / Restrictions Precautions Precautions: Fall Restrictions Weight Bearing Restrictions: No    Mobility  Bed Mobility Overal bed mobility: Independent                Transfers Overall transfer level: Independent               General transfer comment: Independent with transfers from edge of bed and toilet  Ambulation/Gait Ambulation/Gait assistance: Supervision;Min guard Gait Distance (Feet): 350 Feet Assistive device: None Gait Pattern/deviations: Step-through pattern;Decreased dorsiflexion - right;Decreased dorsiflexion - left;Decreased stride length Gait velocity: decreased   General Gait Details: patient requiring supervision to min guard for safety with dynamic ambulation   Stairs             Wheelchair Mobility    Modified Rankin (Stroke Patients Only)       Balance Overall balance assessment: Needs assistance Sitting-balance support: No upper extremity supported;Feet supported Sitting balance-Leahy Scale: Normal     Standing balance support: No upper extremity supported;During functional activity Standing balance-Leahy Scale: Good Standing balance comment: Able to statically stand independently but needs additional support for dynamic balance                 Standardized Balance Assessment Standardized Balance Assessment : Dynamic Gait Index   Dynamic Gait Index Level Surface: Mild Impairment Change in Gait Speed: Mild Impairment Gait with Horizontal Head Turns: Mild Impairment Gait with Vertical Head Turns: Mild Impairment Gait and Pivot Turn: Moderate Impairment Step Over Obstacle: Moderate Impairment Step Around Obstacles: Mild Impairment Steps: Mild Impairment Total Score: 14      Cognition Arousal/Alertness: Awake/alert Behavior During Therapy: WFL for tasks assessed/performed Overall Cognitive Status: Within Functional Limits for tasks assessed                                        Exercises Other Exercises Other Exercises: Dynamic gait including tandem walking and varying gait speeds  Other Exercises: Dynamic balance activiites at sink including brushing teeth and hair     General Comments        Pertinent Vitals/Pain Pain Assessment: No/denies pain    Home Living                      Prior Function  PT Goals (current goals can now be found in the care plan section) Acute Rehab PT Goals Patient Stated Goal: go home PT Goal Formulation: With patient Time For Goal Achievement: 02/22/18 Potential to Achieve Goals: Good Progress towards PT goals: Progressing toward goals    Frequency    Min 3X/week      PT Plan Current plan remains appropriate    Co-evaluation               AM-PAC PT "6 Clicks" Daily Activity  Outcome Measure  Difficulty turning over in bed (including adjusting bedclothes, sheets and blankets)?: None Difficulty moving from lying on back to sitting on the side of the bed? : None Difficulty sitting down on and standing up from a chair with arms (e.g., wheelchair, bedside commode, etc,.)?: None Help needed moving to and from a bed to chair (including a wheelchair)?: A Little Help needed walking in hospital room?: A Little Help needed climbing 3-5 steps with a railing? : A Little 6 Click Score: 21    End of Session Equipment Utilized During Treatment: Gait belt Activity Tolerance: Patient tolerated treatment well Patient left: in bed;with call bell/phone within reach;with bed alarm set Nurse Communication: Mobility status PT Visit Diagnosis: Unsteadiness on feet (R26.81);Difficulty in walking, not elsewhere classified (R26.2)     Time: 1610-9604 PT Time Calculation (min) (ACUTE ONLY): 34 min  Charges:  $Therapeutic Activity: 23-37 mins                    G Codes:      Laurina Bustle, PT, DPT Acute Rehabilitation Services  Pager: [phone]    Vanetta Mulders 02/12/2018, 10:08 AM

## 2018-02-12 NOTE — Discharge Instructions (Signed)
Orthostatic Hypotension Orthostatic hypotension is a sudden drop in blood pressure that happens when you quickly change positions, such as when you get up from a seated or lying position. Blood pressure is a measurement of how strongly, or weakly, your blood is pressing against the walls of your arteries. Arteries are blood vessels that carry blood from your heart throughout your body. When blood pressure is too low, you may not get enough blood to your brain or to the rest of your organs. This can cause weakness, light-headedness, rapid heartbeat, and fainting. This can last for just a few seconds or for up to a few minutes. Orthostatic hypotension is usually not a serious problem. However, if it happens frequently or gets worse, it may be a sign of something more serious. What are the causes? This condition may be caused by:  Sudden changes in posture, such as standing up quickly after you have been sitting or lying down.  Blood loss.  Loss of body fluids (dehydration).  Heart problems.  Hormone (endocrine) problems.  Pregnancy.  Severe infection.  Lack of certain nutrients.  Severe allergic reactions (anaphylaxis).  Certain medicines, such as blood pressure medicine or medicines that make the body lose excess fluids (diuretics). Sometimes, this condition can be caused by not taking medicine as directed, such as taking too much of a certain medicine.  What increases the risk? Certain factors can make you more likely to develop orthostatic hypotension, including:  Age. Risk increases as you get older.  Conditions that affect the heart or the central nervous system.  Taking certain medicines, such as blood pressure medicine or diuretics.  Being pregnant.  What are the signs or symptoms? Symptoms of this condition may include:  Weakness.  Light-headedness.  Dizziness.  Blurred vision.  Fatigue.  Rapid heartbeat.  Fainting, in severe cases.  How is this  diagnosed? This condition is diagnosed based on:  Your medical history.  Your symptoms.  Your blood pressure measurement. Your health care provider will check your blood pressure when you are: ? Lying down. ? Sitting. ? Standing.  A blood pressure reading is recorded as two numbers, such as "120 over 80" (or 120/80). The first ("top") number is called the systolic pressure. It is a measure of the pressure in your arteries as your heart beats. The second ("bottom") number is called the diastolic pressure. It is a measure of the pressure in your arteries when your heart relaxes between beats. Blood pressure is measured in a unit called mm Hg. Healthy blood pressure for adults is 120/80. If your blood pressure is below 90/60, you may be diagnosed with hypotension. Other information or tests that may be used to diagnose orthostatic hypotension include:  Your other vital signs, such as your heart rate and temperature.  Blood tests.  Tilt table test. For this test, you will be safely secured to a table that moves you from a lying position to an upright position. Your heart rhythm and blood pressure will be monitored during the test.  How is this treated? Treatment for this condition may include:  Changing your diet. This may involve eating more salt (sodium) or drinking more water.  Taking medicines to raise your blood pressure.  Changing the dosage of certain medicines you are taking that might be lowering your blood pressure.  Wearing compression stockings. These stockings help to prevent blood clots and reduce swelling in your legs.  In some cases, you may need to go to the hospital for:    Fluid replacement. This means you will receive fluids through an IV tube.  Blood replacement. This means you will receive donated blood through an IV tube (transfusion).  Treating an infection or heart problems, if this applies.  Monitoring. You may need to be monitored while medicines that you  are taking wear off.  Follow these instructions at home: Eating and drinking   Drink enough fluid to keep your urine clear or pale yellow.  Eat a healthy diet and follow instructions from your health care provider about eating or drinking restrictions. A healthy diet includes: ? Fresh fruits and vegetables. ? Whole grains. ? Lean meats. ? Low-fat dairy products.  Eat extra salt only as directed. Do not add extra salt to your diet unless your health care provider told you to do that.  Eat frequent, small meals.  Avoid standing up suddenly after eating. Medicines  Take over-the-counter and prescription medicines only as told by your health care provider. ? Follow instructions from your health care provider about changing the dosage of your current medicines, if this applies. ? Do not stop or adjust any of your medicines on your own. General instructions  Wear compression stockings as told by your health care provider.  Get up slowly from lying down or sitting positions. This gives your blood pressure a chance to adjust.  Avoid hot showers and excessive heat as directed by your health care provider.  Return to your normal activities as told by your health care provider. Ask your health care provider what activities are safe for you.  Do not use any products that contain nicotine or tobacco, such as cigarettes and e-cigarettes. If you need help quitting, ask your health care provider.  Keep all follow-up visits as told by your health care provider. This is important. Contact a health care provider if:  You vomit.  You have diarrhea.  You have a fever for more than 2-3 days.  You feel more thirsty than usual.  You feel weak and tired. Get help right away if:  You have chest pain.  You have a fast or irregular heartbeat.  You develop numbness in any part of your body.  You cannot move your arms or your legs.  You have trouble speaking.  You become sweaty or feel  lightheaded.  You faint.  You feel short of breath.  You have trouble staying awake.  You feel confused. This information is not intended to replace advice given to you by your health care provider. Make sure you discuss any questions you have with your health care provider. Document Released: 07/27/2002 Document Revised: 04/24/2016 Document Reviewed: 01/27/2016 Elsevier Interactive Patient Education  2018 Elsevier Inc.  

## 2018-02-12 NOTE — Progress Notes (Signed)
Pt worked with patient ambulated in hall with gait belt.  Orthos positive with PT.  Paged Dr. Callie Fielding.

## 2018-02-17 ENCOUNTER — Telehealth: Payer: Self-pay | Admitting: Neurology

## 2018-02-17 NOTE — Telephone Encounter (Signed)
Spoke with Catherine Hubbard. She sts. she had diarrhea for 2 wks, became dehydrated and had a syncopal episode.  Was hospitalized for several days, rehydrated and dc/.  Still sometimes has dizziness with position changes but continues to improve.  Diarrhea resolved and she continues to push fluids, including electrolyte solutions like Propel. She will keep the 8/1 appt. with RAS and I will call her if he has a sooner cancellation/fim

## 2018-02-17 NOTE — Telephone Encounter (Signed)
Pt was just released from the hospital and told to f/u in 2 weeks. The 1st available appointment is 8-8 for 3:30(check in 3:00) pt has accepted and is on wait list.  Pt is asking if there is a way to come in before to please call

## 2018-03-01 ENCOUNTER — Emergency Department (HOSPITAL_COMMUNITY)
Admission: EM | Admit: 2018-03-01 | Discharge: 2018-03-02 | Disposition: A | Discharge status: Home / Self Care | Payer: Federal, State, Local not specified - PPO | Attending: Emergency Medicine | Admitting: Emergency Medicine

## 2018-03-01 ENCOUNTER — Emergency Department (HOSPITAL_COMMUNITY): Payer: Federal, State, Local not specified - PPO

## 2018-03-01 ENCOUNTER — Other Ambulatory Visit: Payer: Self-pay

## 2018-03-01 ENCOUNTER — Encounter (HOSPITAL_COMMUNITY): Payer: Self-pay | Admitting: Emergency Medicine

## 2018-03-01 DIAGNOSIS — W19XXXA Unspecified fall, initial encounter: Secondary | ICD-10-CM

## 2018-03-01 DIAGNOSIS — J45909 Unspecified asthma, uncomplicated: Secondary | ICD-10-CM | POA: Diagnosis not present

## 2018-03-01 DIAGNOSIS — Y999 Unspecified external cause status: Secondary | ICD-10-CM | POA: Diagnosis not present

## 2018-03-01 DIAGNOSIS — Z79899 Other long term (current) drug therapy: Secondary | ICD-10-CM | POA: Diagnosis not present

## 2018-03-01 DIAGNOSIS — I10 Essential (primary) hypertension: Secondary | ICD-10-CM | POA: Diagnosis not present

## 2018-03-01 DIAGNOSIS — Y92014 Private driveway to single-family (private) house as the place of occurrence of the external cause: Secondary | ICD-10-CM | POA: Diagnosis not present

## 2018-03-01 DIAGNOSIS — Y939 Activity, unspecified: Secondary | ICD-10-CM | POA: Diagnosis not present

## 2018-03-01 DIAGNOSIS — Z87891 Personal history of nicotine dependence: Secondary | ICD-10-CM | POA: Insufficient documentation

## 2018-03-01 DIAGNOSIS — S2241XA Multiple fractures of ribs, right side, initial encounter for closed fracture: Secondary | ICD-10-CM | POA: Diagnosis not present

## 2018-03-01 DIAGNOSIS — W01198A Fall on same level from slipping, tripping and stumbling with subsequent striking against other object, initial encounter: Secondary | ICD-10-CM | POA: Diagnosis not present

## 2018-03-01 DIAGNOSIS — R0789 Other chest pain: Secondary | ICD-10-CM | POA: Diagnosis not present

## 2018-03-01 NOTE — ED Provider Notes (Signed)
[facility] EMERGENCY DEPARTMENT Provider Note   CSN: [id number] Arrival date & time: 03/01/18  2100     History   Chief Complaint Chief Complaint  Patient presents with  . Fall    HPI Catherine Hubbard is a 57 y.o. female.  58yo female presents with right chest wall pain after a fall. Patient states she went to get something out of her car and fell, landing on the driveway at the edge of the grass injuring her ribs. Reports multiple rib fractures in the past from falls, cough, denies known osteoporosis. Patient is not on blood thinners, states she bruises easily and has thin skin, is concerned she has a broken rib again. Denies abdominal pain, nausea, vomiting, LOC, shortness of breath, did not hit head. No other injuries or concerns.      Past Medical History:  Diagnosis Date  . Asthma   . Esophagitis   . Headache   . Hearing loss   . Hypertension   . Multiple sclerosis (HCC)   . Vision abnormalities     Patient Active Problem List   Diagnosis Date Noted  . Syncope due to orthostatic hypotension 02/12/2018  . Diarrhea 02/08/2018  . Acute lower UTI 02/08/2018  . Hematoma of scalp 02/08/2018  . Neck pain 01/20/2018  . Foreign body in esophagus   . Esophageal stricture   . Intractable nausea and vomiting 01/31/2017  . Dysphagia   . Severe protein-calorie malnutrition (HCC)   . Nausea & vomiting 01/30/2017  . Hypokalemia 01/30/2017  . Acute kidney injury (HCC) 01/30/2017  . Cystitis 01/30/2017  . Costochondritis 05/11/2016  . Trochanteric bursitis of both hips 03/28/2016  . Upper back pain 03/28/2016  . Bilateral low back pain with bilateral sciatica 07/13/2015  . Right hip pain 07/13/2015  . Lumbar radicular pain 03/21/2015  . Other fatigue 01/11/2015  . Dysesthesia 01/11/2015  . Urinary urgency 01/11/2015  . Multiple sclerosis (HCC) 10/06/2014  . Numbness 10/06/2014  . Ataxic gait 10/06/2014  . High risk medication use 10/06/2014    . Gastric bypass status for obesity 10/06/2014  . Cognitive changes 10/06/2014  . Depression with anxiety 10/06/2014  . Restless leg 10/06/2014  . Insomnia 10/06/2014  . Essential hypertension 10/06/2014  . Difficulty hearing 01/13/2014  . Other muscle spasm 01/13/2014    Past Surgical History:  Procedure Laterality Date  . ANKLE FRACTURE SURGERY Bilateral   . CARPAL TUNNEL RELEASE Bilateral   . COLONOSCOPY  11/2016   multiple  . ESOPHAGOGASTRODUODENOSCOPY  11/2016  . ESOPHAGOGASTRODUODENOSCOPY N/A 02/01/2017   Procedure: ESOPHAGOGASTRODUODENOSCOPY (EGD);  Surgeon: Iva Boop, MD;  [facility];  Service: Endoscopy;  Laterality: N/A;  . ESOPHAGOGASTRODUODENOSCOPY (EGD) WITH PROPOFOL N/A 02/04/2017   Procedure: ESOPHAGOGASTRODUODENOSCOPY (EGD) WITH PROPOFOL;  Surgeon: Meryl Dare, MD;  Location[facility] ENDOSCOPY;  Service: Endoscopy;  Laterality: N/A;  . LAPAROSCOPIC GASTRIC SLEEVE RESECTION  2011  . ULNAR NERVE REPAIR Bilateral      OB History   None      Home Medications    Prior to Admission medications   Medication Sig Start Date End Date Taking? Authorizing Provider  albuterol (PROVENTIL HFA;VENTOLIN HFA) 108 (90 Base) MCG/ACT inhaler Inhale 2 puffs into the lungs every 6 (six) hours as needed for wheezing or shortness of breath.    [provider]  buPROPion (WELLBUTRIN XL) 300 MG 24 hr tablet Take 1 tablet (300 mg total) by mouth daily. 01/20/18   Sater, Pearletha Furl, MD  cyclobenzaprine (FLEXERIL) 10 MG tablet Take 1 tablet (10 mg total) by mouth 2 (two) times daily as needed for muscle spasms. 12/05/17   Fawze, Mina A, PA-C  DULoxetine (CYMBALTA) 60 MG capsule Take 1 capsule (60 mg total) by mouth daily. 01/20/18   Sater, Pearletha Furl, MD  HYDROcodone-acetaminophen (NORCO/VICODIN) 5-325 MG tablet Take 1 tablet by mouth every 4 (four) hours as needed. 03/02/18   Jeannie Fend, PA-C  Multiple Vitamin (MULTIVITAMIN WITH MINERALS) TABS tablet Take 2 tablets  by mouth daily.    [provider]  Multiple Vitamins-Minerals (HAIR/SKIN/NAILS/BIOTIN) TABS Take 2 tablets by mouth daily.    [provider]  ocrelizumab 600 mg in sodium chloride 0.9 % 500 mL Inject 600 mg into the vein every 6 (six) months. Last infusion May 2019 - due in December 2019    [provider]  omeprazole (PRILOSEC) 20 MG capsule Take 40 mg by mouth 2 (two) times daily.    [provider]  protein supplement shake (PREMIER PROTEIN) LIQD Take 325 mLs (11 oz total) by mouth 3 (three) times daily between meals. 02/04/17   Narda Bonds, MD  venlafaxine XR (EFFEXOR-XR) 75 MG 24 hr capsule Take 75 mg by mouth daily with breakfast. 01/24/18   [provider]    Family History Family History  Problem Relation Age of Onset  . Cancer Mother   . Stroke Mother   . Cancer Father     Social History Social History   Tobacco Use  . Smoking status: Former Games developer  . Smokeless tobacco: Never Used  Substance Use Topics  . Alcohol use: Yes    Alcohol/week: 0.0 oz    Comment: Sts. she drinks beer, liquor daily, varying amts/fim  . Drug use: No     Allergies   Ibuprofen   Review of Systems Review of Systems  Constitutional: Negative for chills and fever.  Respiratory: Negative for cough and shortness of breath.   Cardiovascular: Negative for chest pain.  Gastrointestinal: Negative for abdominal pain, nausea and vomiting.  Musculoskeletal: Negative for back pain and neck pain.  Skin: Negative for rash and wound.  Allergic/Immunologic: Positive for immunocompromised state.  Neurological: Negative for dizziness and weakness.  Hematological: Bruises/bleeds easily.  Psychiatric/Behavioral: Negative for confusion.  All other systems reviewed and are negative.    Physical Exam Updated Vital Signs BP (!) 144/102 (BP Location: Right Arm)   Pulse 99   Temp 97.9 F (36.6 C) (Oral)   Resp 18   Ht 5\' 5"  (1.651 m)   Wt 59 kg (130 lb)    LMP 08/13/2014 Comment: very few periods  SpO2 100%   BMI 21.63 kg/m   Physical Exam  Constitutional: She is oriented to person, place, and time. She appears well-developed and well-nourished.  HENT:  Head: Normocephalic and atraumatic.  Cardiovascular: Normal rate, regular rhythm, normal heart sounds and intact distal pulses.  Pulmonary/Chest: Effort normal and breath sounds normal. No respiratory distress.     She exhibits tenderness.  Right mid to lower chest wall pain mid clavicular to posterior     Abdominal: Soft. She exhibits no distension. There is no tenderness.  Musculoskeletal:       Cervical back: She exhibits no bony tenderness.       Thoracic back: She exhibits no bony tenderness.       Lumbar back: She exhibits no bony tenderness.  Scoliosis   Neurological: She is alert and oriented to person, place, and time.  Skin: Skin is warm and dry. No rash noted.  Psychiatric: She has a normal mood and affect. Her behavior is normal.  Nursing note and vitals reviewed.    ED Treatments / Results  Labs (all labs ordered are listed, but only abnormal results are displayed) Labs Reviewed - No data to display  EKG None  Radiology Dg Ribs Unilateral W/chest Right  Result Date: 03/01/2018 CLINICAL DATA:  57 year old female with fall and trauma to the right chest. EXAM: RIGHT RIBS AND CHEST - 3+ VIEW COMPARISON:  Chest radiograph dated 02/07/2018 FINDINGS: The lungs are clear. There is no pleural effusion or pneumothorax. The cardiac silhouette is within normal limits. The aorta is tortuous. Acute fractures of the right posterior fifth, sixth ribs and old-appearing fracture of the lateral seventh rib. IMPRESSION: Right posterior fifth and sixth rib fractures.  No pneumothorax. Electronically Signed   By: Elgie Collard M.D.   On: 03/01/2018 23:50    Procedures Procedures (including critical care time)  Medications Ordered in ED Medications  HYDROcodone-acetaminophen  (NORCO/VICODIN) 5-325 MG per tablet 1 tablet (has no administration in time range)     Initial Impression / Assessment and Plan / ED Course  I have reviewed the triage vital signs and the nursing notes.  Pertinent labs & imaging results that were available during my care of the patient were reviewed by me and considered in my medical decision making (see chart for details).  Clinical Course as of Mar 02 8  Sun Mar 02, 2018  1928 57 year old female presents with complaint of right-sided rib pain after fall tonight landing on her driveway.  Patient denies feeling weak or dizzy prior to the fall, did not hit head, no loss of consciousness, normal blood pressures.  Patient reports multiple rib fractures in the past and is concerned she has never fractured tonight.  On exam patient has diffuse right-sided chest wall tenderness specifically from the back to her mid axillary right chest, no open wounds, no ecchymosis, no crepitus.  Lung sounds are normal.  X-ray shows posterior right fifth and sixth rib fractures.  Patient states that she took half of a leftover hydrocodone before coming tonight which helped with her pain somewhat however she is now out of this prescription.  I reviewed the narcotics database, prescription last filled for 5/325 hydrocodone on December 13, 2017.  Patient is familiar with rib fracture treatment, she has incentive spirometer at home that she will use, I will give her a prescription for Norco.  Patient states her PCP recently retired and does not have a provider currently.  Advised her to call her insurance and find in network provider, given referrals to local community resources.  Return to the urgent care or emergency room at any point as needed.  Patient and spouse verbalized understanding of discharge instructions and plan, patient is ready for discharge home.   [LM]    Clinical Course User Index [LM] Jeannie Fend, PA-C    Final Clinical Impressions(s) / ED Diagnoses     Final diagnoses:  Fall, initial encounter  Closed fracture of multiple ribs of right side, initial encounter    ED Discharge Orders        Ordered    HYDROcodone-acetaminophen (NORCO/VICODIN) 5-325 MG tablet  Every 4 hours PRN     03/02/18 0003       Jeannie Fend, PA-C 03/02/18 0010    Dione Booze, MD 03/02/18 [phone]

## 2018-03-01 NOTE — ED Notes (Signed)
Pt to xray at this time.

## 2018-03-01 NOTE — ED Notes (Signed)
Called x's 3 without response 

## 2018-03-01 NOTE — ED Notes (Signed)
Pt st's she fell earlier tonight hitting her right side on the sidewalk.  Pt st's she does not know what caused her to fall.  Pt denies LOC

## 2018-03-01 NOTE — ED Triage Notes (Signed)
Patient fell while getting things out of her car this evening. States she fell on her right side and is complaining of rib pain. Pain 10/10, intermittent. VSS in triage. No visible injuries noted during assessment.

## 2018-03-01 NOTE — Discharge Instructions (Addendum)
Recheck with your PCP Monday, return to ER for worsening or concerning symptoms. Take Norco as needed as prescribed for pain, do not drive or operate machinery while taking Norco. Use your incentive spirometer throughout the day to be sure to get pulmonary expansion to prevent pneumonia. Referrals given for local PCP, you may also check with your insurance to find a provider in network.  Recheck with urgent care or ER as needed.

## 2018-03-02 MED ORDER — HYDROCODONE-ACETAMINOPHEN 5-325 MG PO TABS: 1.0000 | ORAL_TABLET | ORAL | 0 refills | Status: DC | PRN

## 2018-03-02 MED ORDER — HYDROCODONE-ACETAMINOPHEN 5-325 MG PO TABS
1.0000 | ORAL_TABLET | Freq: Once | ORAL | Status: AC
  Administered 2018-03-02: 1 via ORAL
  Filled 2018-03-02: qty 1

## 2018-03-06 ENCOUNTER — Telehealth: Payer: Self-pay | Admitting: Neurology

## 2018-03-06 NOTE — Telephone Encounter (Signed)
Spoke with Catherine Hubbard.  She saw a specialist after her 4/7 fall.  Sts. that physician didn't take her out of work, but Catherine Hubbard told her employer that the doctor preferred her not work.  Now the employer is requesting clearance to return to work.  She will get this from the ? orthopaedist that she saw/fim

## 2018-03-06 NOTE — Telephone Encounter (Signed)
Pt fell on 4/7, was seen at [facility] and then tranferred to [facility]. Pt said she fx her tailbone and was unable to work and her job understood she would be out until she felt better. Pt has called today stating she feels better is now wanting to return to work but her job is needing a letter stating she is released to work. Pt said she did not see Dr Epimenio Foot but is wanting a note from any doctor to return to work. Pt said a doctor did not take her out of work and she really did not see a doctor outside of the hospital. She was referred to a doctor "but it was the wrong kind and let it go after that". Phone rep did advise her Dr Epimenio Foot may not be able to provide this letter of release as he has not seen for this condition. Please call to advise

## 2018-03-13 DIAGNOSIS — S32010D Wedge compression fracture of first lumbar vertebra, subsequent encounter for fracture with routine healing: Secondary | ICD-10-CM | POA: Diagnosis not present

## 2018-03-13 DIAGNOSIS — S3210XD Unspecified fracture of sacrum, subsequent encounter for fracture with routine healing: Secondary | ICD-10-CM | POA: Diagnosis not present

## 2018-03-19 ENCOUNTER — Encounter: Payer: Self-pay | Admitting: Medical

## 2018-03-19 ENCOUNTER — Telehealth: Payer: Self-pay | Admitting: Medical

## 2018-03-19 ENCOUNTER — Ambulatory Visit (HOSPITAL_BASED_OUTPATIENT_CLINIC_OR_DEPARTMENT_OTHER)
Admission: RE | Admit: 2018-03-19 | Discharge: 2018-03-19 | Disposition: A | Discharge status: Home / Self Care | Payer: Federal, State, Local not specified - PPO | Source: Ambulatory Visit | Attending: Medical | Admitting: Medical

## 2018-03-19 ENCOUNTER — Ambulatory Visit: Payer: Federal, State, Local not specified - PPO | Admitting: Medical

## 2018-03-19 VITALS — BP 158/93 | HR 98 | Temp 98.4°F | Resp 16 | Ht 65.0 in | Wt 147.2 lb

## 2018-03-19 DIAGNOSIS — I10 Essential (primary) hypertension: Secondary | ICD-10-CM

## 2018-03-19 DIAGNOSIS — R062 Wheezing: Secondary | ICD-10-CM | POA: Diagnosis not present

## 2018-03-19 DIAGNOSIS — I771 Stricture of artery: Secondary | ICD-10-CM | POA: Insufficient documentation

## 2018-03-19 DIAGNOSIS — R0600 Dyspnea, unspecified: Secondary | ICD-10-CM | POA: Insufficient documentation

## 2018-03-19 DIAGNOSIS — I8391 Asymptomatic varicose veins of right lower extremity: Secondary | ICD-10-CM | POA: Diagnosis not present

## 2018-03-19 DIAGNOSIS — I700 Atherosclerosis of aorta: Secondary | ICD-10-CM | POA: Diagnosis not present

## 2018-03-19 DIAGNOSIS — J4520 Mild intermittent asthma, uncomplicated: Secondary | ICD-10-CM

## 2018-03-19 DIAGNOSIS — R600 Localized edema: Secondary | ICD-10-CM | POA: Diagnosis not present

## 2018-03-19 DIAGNOSIS — M7989 Other specified soft tissue disorders: Secondary | ICD-10-CM | POA: Diagnosis not present

## 2018-03-19 DIAGNOSIS — K219 Gastro-esophageal reflux disease without esophagitis: Secondary | ICD-10-CM

## 2018-03-19 LAB — CBC WITH DIFFERENTIAL/PLATELET
BASOS ABS: 0.1 10*3/uL (ref 0.0–0.1)
Basophils Relative: 1.1 % (ref 0.0–3.0)
Eosinophils Absolute: 0.2 10*3/uL (ref 0.0–0.7)
Eosinophils Relative: 3 % (ref 0.0–5.0)
HEMATOCRIT: 36.1 % (ref 36.0–46.0)
Hemoglobin: 12.1 g/dL (ref 12.0–15.0)
LYMPHS PCT: 22.2 % (ref 12.0–46.0)
Lymphs Abs: 1.3 10*3/uL (ref 0.7–4.0)
MCHC: 33.6 g/dL (ref 30.0–36.0)
MCV: 100.9 fl — AB (ref 78.0–100.0)
MONOS PCT: 13.4 % — AB (ref 3.0–12.0)
Monocytes Absolute: 0.8 10*3/uL (ref 0.1–1.0)
NEUTROS ABS: 3.6 10*3/uL (ref 1.4–7.7)
Neutrophils Relative %: 60.3 % (ref 43.0–77.0)
PLATELETS: 370 10*3/uL (ref 150.0–400.0)
RBC: 3.58 Mil/uL — AB (ref 3.87–5.11)
RDW: 12.9 % (ref 11.5–15.5)
WBC: 6.1 10*3/uL (ref 4.0–10.5)

## 2018-03-19 LAB — COMPREHENSIVE METABOLIC PANEL
ALK PHOS: 120 U/L — AB (ref 39–117)
ALT: 12 U/L (ref 0–35)
AST: 25 U/L (ref 0–37)
Albumin: 3.9 g/dL (ref 3.5–5.2)
BILIRUBIN TOTAL: 0.6 mg/dL (ref 0.2–1.2)
BUN: 12 mg/dL (ref 6–23)
CALCIUM: 9.6 mg/dL (ref 8.4–10.5)
CO2: 34 mEq/L — ABNORMAL HIGH (ref 19–32)
Chloride: 103 mEq/L (ref 96–112)
Creatinine, Ser: 0.51 mg/dL (ref 0.40–1.20)
GFR: 131.77 mL/min (ref >60.00)
Glucose, Bld: 98 mg/dL (ref 70–99)
Potassium: 5.3 mEq/L — ABNORMAL HIGH (ref 3.5–5.1)
Sodium: 142 mEq/L (ref 135–145)
TOTAL PROTEIN: 6.9 g/dL (ref 6.0–8.3)

## 2018-03-19 LAB — TROPONIN I: TNIDX: 0.01 ug/L (ref 0.00–0.06)

## 2018-03-19 LAB — BRAIN NATRIURETIC PEPTIDE: PRO B NATRI PEPTIDE: 93 pg/mL (ref 0.0–100.0)

## 2018-03-19 MED ORDER — HYDROCHLOROTHIAZIDE 25 MG PO TABS: 25.0000 mg | ORAL_TABLET | Freq: Every day | ORAL | 0 refills | Status: DC

## 2018-03-19 NOTE — Progress Notes (Signed)
Subjective:    Patient ID: Catherine Hubbard, female    DOB: [birth year], 57 y.o.   MRN: [id number]  HPI  Pt in for first time.  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   Pt states over last weeks her left foot and calf has gradually swollen. Worse swelling at end of the day. Pt states nylon socks have been very tight. No fall or trauma. Left lower ext swelling of leg is worse than rt side. Pt reports mild intermittent sob. More relates to heat and humidity. She uses inhaler and seems to help. On patient flow sheet it looks like she has gained 17 pounds. When walks she not really reporting sob. No orthopnea. No chest pain.  Pt has history of asthma. She states uses inhaler during very hot weather, spring with allergies or following colds. Usually states at most when has to use albuterol will use twice in one day.    Hx of reflux and she does use prilosec.  She does have history esophageal stenosis. Pt had dilation in past.   History of hypertension. She was seen in ED. She fell and thought was related to orhostatic hypertension. She had rib fractures and given norco. Pt state bp meds stopped when in ED.  Pt had admission for syncope end of June. She states end of June is when bp meds stopped. But she did have 3 weeks of diarrhea before syncopal episode. Also had uti at time of syncope.   On discharge losartan was discontinued.  She also has history of MS. See neurologist.  Pt heavy second hand smoker exposure. Moderate alcohol use every other day.   Review of Systems  Constitutional: Positive for unexpected weight change. Negative for chills, fatigue and fever.  HENT: Negative for congestion and ear discharge.   Respiratory: Positive for shortness of breath and wheezing. Negative for cough.   Cardiovascular: Negative for chest pain and palpitations.  Gastrointestinal: Negative for abdominal pain.    Genitourinary: Negative for dysuria.  Musculoskeletal: Negative for back pain.       Pedal edema  Skin: Negative for rash.  Neurological: Negative for dizziness, speech difficulty, weakness, light-headedness and headaches.  Hematological: Negative for adenopathy. Does not bruise/bleed easily.  Psychiatric/Behavioral: Negative for behavioral problems, confusion, sleep disturbance and suicidal ideas.   Past Medical History:  Diagnosis Date  . Asthma   . Esophagitis   . GERD (gastroesophageal reflux disease)   . Headache   . Hearing loss   . Hypertension   . Multiple sclerosis (HCC)   . Vision abnormalities      Social History   Socioeconomic History  . Marital status: Married    Spouse name: Not on file  . Number of children: Not on file  . Years of education: Not on file  . Highest education level: Not on file  Occupational History  . Not on file  Social Needs  . Financial resource strain: Not on file  . Food insecurity:    Worry: Not on file    Inability: Not on file  . Transportation needs:    Medical: Not on file    Non-medical: Not on file  Tobacco Use  . Smoking status: Never Smoker  . Smokeless tobacco: Never Used  . Tobacco comment: second hand smoker when young. Heavy exposure.  Substance and Sexual Activity  . Alcohol use: Yes    Alcohol/week: 0.0 oz  Comment: Sts. she drinks beer, liquor daily, varying amts/fim  . Drug use: No  . Sexual activity: Never  Lifestyle  . Physical activity:    Days per week: Not on file    Minutes per session: Not on file  . Stress: Not on file  Relationships  . Social connections:    Talks on phone: Not on file    Gets together: Not on file    Attends religious service: Not on file    Active member of club or organization: Not on file    Attends meetings of clubs or organizations: Not on file    Relationship status: Not on file  . Intimate partner violence:    Fear of current or ex partner: Not on file     Emotionally abused: Not on file    Physically abused: Not on file    Forced sexual activity: Not on file  Other Topics Concern  . Not on file  Social History Narrative  . Not on file    Past Surgical History:  Procedure Laterality Date  . ANKLE FRACTURE SURGERY Bilateral   . CARPAL TUNNEL RELEASE Bilateral   . COLONOSCOPY  11/2016   multiple  . ESOPHAGOGASTRODUODENOSCOPY  11/2016  . ESOPHAGOGASTRODUODENOSCOPY N/A 02/01/2017   Procedure: ESOPHAGOGASTRODUODENOSCOPY (EGD);  Surgeon: Iva Boop, MD;  Location: [facility] ENDOSCOPY;  Service: Endoscopy;  Laterality: N/A;  . ESOPHAGOGASTRODUODENOSCOPY (EGD) WITH PROPOFOL N/A 02/04/2017   Procedure: ESOPHAGOGASTRODUODENOSCOPY (EGD) WITH PROPOFOL;  Surgeon: Meryl Dare, MD;  Location[facility] ENDOSCOPY;  Service: Endoscopy;  Laterality: N/A;  . LAPAROSCOPIC GASTRIC SLEEVE RESECTION  2011  . ULNAR NERVE REPAIR Bilateral     Family History  Problem Relation Age of Onset  . Cancer Mother   . Stroke Mother   . Cancer Father     Allergies  Allergen Reactions  . Ibuprofen Other (See Comments)    Cannot take due to gastric bypass    Current Outpatient Medications on File Prior to Visit  Medication Sig Dispense Refill  . albuterol (PROVENTIL HFA;VENTOLIN HFA) 108 (90 Base) MCG/ACT inhaler Inhale 2 puffs into the lungs every 6 (six) hours as needed for wheezing or shortness of breath.    . cyclobenzaprine (FLEXERIL) 10 MG tablet Take 1 tablet (10 mg total) by mouth 2 (two) times daily as needed for muscle spasms. 10 tablet 0  . buPROPion (WELLBUTRIN XL) 300 MG 24 hr tablet Take 1 tablet (300 mg total) by mouth daily. (Patient not taking: Reported on 03/19/2018) 90 tablet 1  . DULoxetine (CYMBALTA) 60 MG capsule Take 1 capsule (60 mg total) by mouth daily. (Patient not taking: Reported on 03/19/2018) 90 capsule 3  . Multiple Vitamin (MULTIVITAMIN WITH MINERALS) TABS tablet Take 2 tablets by mouth daily.    . Multiple Vitamins-Minerals  (HAIR/SKIN/NAILS/BIOTIN) TABS Take 2 tablets by mouth daily.    Marland Kitchen ocrelizumab 600 mg in sodium chloride 0.9 % 500 mL Inject 600 mg into the vein every 6 (six) months. Last infusion May 2019 - due in December 2019    . omeprazole (PRILOSEC) 20 MG capsule Take 40 mg by mouth 2 (two) times daily.    . protein supplement shake (PREMIER PROTEIN) LIQD Take 325 mLs (11 oz total) by mouth 3 (three) times daily between meals. (Patient not taking: Reported on 03/19/2018) 30 Can 0  . venlafaxine XR (EFFEXOR-XR) 75 MG 24 hr capsule Take 75 mg by mouth daily with breakfast.  11   No current facility-administered medications on file  prior to visit.     BP (!) 158/93   Pulse 98   Temp 98.4 F (36.9 C) (Oral)   Resp 16   Ht 5\' 5"  (1.651 m)   Wt 147 lb 3.2 oz (66.8 kg)   LMP 08/13/2014 Comment: very few periods  SpO2 99%   BMI 24.50 kg/m       Objective:   Physical Exam  General Mental Status- Alert. General Appearance- Not in acute distress.   Skin General: Color- Normal Color. Moisture- Normal Moisture.  Neck Carotid Arteries- Normal color. Moisture- Normal Moisture. No carotid bruits. No JVD.  Chest and Lung Exam Auscultation: Breath Sounds:-Normal.  Cardiovascular Auscultation:Rythm- Regular. Murmurs & Other Heart Sounds:Auscultation of the heart reveals- No Murmurs.  Abdomen Inspection:-Inspeection Normal. Palpation/Percussion:Note:No mass. Palpation and Percussion of the abdomen reveal- Non Tender, Non Distended + BS, no rebound or guarding.   Neurologic Cranial Nerve exam:- CN III-XII intact(No nystagmus), symmetric smile. Strength:- 5/5 equal and symmetric strength both upper and lower extremities.   Lower ext- 1+ pedal edema left side. 2+ pedal edema on rt side. Neg homan sign bilaterally.    Assessment & Plan:  You do have hypertension history and blood pressures moderately elevated presently.  You had recent syncopal episode that appeared to be related to dehydration.   I do need to restart you on a blood pressure medication but first want to get work-up labs and imaging back since you might need to be on diuretic in addition to blood pressure medication.  Have not decided yet if we will restart your former losartan or give other medication.  For mild transient wheezing, continue with your albuterol.  This wheezing might be related to mild asthma flare versus CHF.  Your pedal edema might be related to CHF.  We will follow chest x-ray and BNP results.  But also decided to get bilateral ultrasound studies today.  For history of reflux continue your omeprazole.  Labs ordered today will include CMP, BNP, CBC, chest x-ray and bilateral lower extremity ultrasounds.  Please keep your cell phone on.  We will need to go over results with you.  If you have any acute worsening signs or symptoms then recommend ED evaluation.  It is possible that I might need to refer you to the ED depending on what studies show.  Follow-up date to be determined after lab review.  45 minutes spent with patient today.  She is a new patient with a complicated medical history as well as recent ED evaluation and hospitalization.  50% of time spent counseling patient regarding work-up for recent symptoms.  Her condition could be CHF, DVTs and possible asthma flare as well.  [name], PA-C

## 2018-03-19 NOTE — Patient Instructions (Addendum)
You do have hypertension history and blood pressures moderately elevated presently.  You had recent syncopal episode that appeared to be related to dehydration.  I do need to restart you on a blood pressure medication but first want to get work-up labs and imaging back since you might need to be on diuretic in addition to blood pressure medication.  Have not decided yet if we will restart your former losartan or give other medication.  For mild transient wheezing, continue with your albuterol.  This wheezing might be related to mild asthma flare versus CHF.  Your pedal edema might be related to CHF.  We will follow chest x-ray and BNP results.  But also decided to get bilateral ultrasound studies today.  For history of reflux continue your omeprazole.  Labs ordered today will include CMP, BNP, CBC, troponin chest x-ray and bilateral lower extremity ultrasounds.(labs stat)  Please keep your cell phone on.  We will need to go over results with you.  If you have any acute worsening signs or symptoms then recommend ED evaluation.  It is possible that I might need to refer you to the ED depending on what studies show.  Follow-up date to be determined after lab review.  [place] scheduled at 3:30 today. Hold and call.

## 2018-03-19 NOTE — Telephone Encounter (Signed)
Prescription of HCTZ sent to patient's pharmacy.

## 2018-03-26 ENCOUNTER — Ambulatory Visit: Payer: Federal, State, Local not specified - PPO | Admitting: Family Medicine

## 2018-03-26 ENCOUNTER — Encounter: Payer: Self-pay | Admitting: Family Medicine

## 2018-03-26 VITALS — BP 128/88 | HR 91 | Temp 98.3°F | Ht 65.0 in | Wt 140.1 lb

## 2018-03-26 DIAGNOSIS — I10 Essential (primary) hypertension: Secondary | ICD-10-CM | POA: Diagnosis not present

## 2018-03-26 DIAGNOSIS — G4762 Sleep related leg cramps: Secondary | ICD-10-CM | POA: Diagnosis not present

## 2018-03-26 LAB — BASIC METABOLIC PANEL
BUN: 17 mg/dL (ref 6–23)
CO2: 36 mEq/L — ABNORMAL HIGH (ref 19–32)
Calcium: 10.2 mg/dL (ref 8.4–10.5)
Chloride: 98 mEq/L (ref 96–112)
Creatinine, Ser: 0.64 mg/dL (ref 0.40–1.20)
GFR: 101.39 mL/min (ref >60.00)
GLUCOSE: 99 mg/dL (ref 70–99)
POTASSIUM: 4.9 meq/L (ref 3.5–5.1)
Sodium: 139 mEq/L (ref 135–145)

## 2018-03-26 LAB — MAGNESIUM: Magnesium: 1.8 mg/dL (ref 1.5–2.5)

## 2018-03-26 NOTE — Progress Notes (Signed)
Chief Complaint  Patient presents with  . Follow-up    blood pressure and weight    Subjective Catherine Hubbard is a 58 y.o. female who presents for hypertension follow up. She does not monitor home blood pressures. She is compliant with medication- started on HCTZ 25 mg/d. Patient has these side effects of medication: none She is adhering to a healthy diet overall. Current exercise: walking, physically active at home Swelling has improved.  Echo on 01/2018 showed EF for 65-70% w grade 1 diastolic dysfunction.  Has been having nighttime leg cramps. This has been going on for longer than she has been on diuretic.   Past Medical History:  Diagnosis Date  . Asthma   . Esophagitis   . GERD (gastroesophageal reflux disease)   . Headache   . Hearing loss   . Hypertension   . Multiple sclerosis (HCC)   . Vision abnormalities     Review of Systems Cardiovascular: no chest pain Respiratory:  no shortness of breath  Exam BP 128/88 (BP Location: Left Arm, Patient Position: Sitting, Cuff Size: Normal)   Pulse 91   Temp 98.3 F (36.8 C) (Oral)   Ht 5\' 5"  (1.651 m)   Wt 140 lb 2 oz (63.6 kg)   LMP 08/13/2014 Comment: very few periods  SpO2 97%   BMI 23.32 kg/m  General:  well developed, well nourished, in no apparent distress Heart: RRR, no bruits, 2+ pitting LE edema b/l up to prox 1/3 of tibia Lungs: clear to auscultation, no accessory muscle use Psych: well oriented with normal range of affect and appropriate judgment/insight  Essential hypertension - Plan: Basic metabolic panel  Nocturnal leg cramps - Plan: Magnesium  Orders as above. Mind salt intake, elevate legs, stay active, use compression. This is improved, stay on HCTZ.  Counseled on diet and exercise. F/u in in 6 weeks with reg PCP. The patient voiced understanding and agreement to the plan.  Jilda Roche [place], DO 03/26/18  11:28 AM

## 2018-03-26 NOTE — Patient Instructions (Signed)
Things look good today. No change in your medication. Let us know need refills.  Try a spoonful of pickle juice before bed to see if it helps with your cramps.  Stay hydrated.  Mind salt intake, use compression stockings, elevate legs, stay active.  Let us know if you need anything.

## 2018-03-26 NOTE — Progress Notes (Signed)
Pre visit review using our clinic review tool, if applicable. No additional management support is needed unless otherwise documented below in the visit note. 

## 2018-03-27 ENCOUNTER — Other Ambulatory Visit: Payer: Self-pay

## 2018-03-27 ENCOUNTER — Encounter: Payer: Self-pay | Admitting: Neurology

## 2018-03-27 ENCOUNTER — Encounter

## 2018-03-27 ENCOUNTER — Ambulatory Visit: Payer: Federal, State, Local not specified - PPO | Admitting: Neurology

## 2018-03-27 VITALS — BP 154/86 | HR 105 | Resp 18 | Ht 65.0 in | Wt 142.5 lb

## 2018-03-27 DIAGNOSIS — R5383 Other fatigue: Secondary | ICD-10-CM

## 2018-03-27 DIAGNOSIS — R3915 Urgency of urination: Secondary | ICD-10-CM

## 2018-03-27 DIAGNOSIS — G35 Multiple sclerosis: Secondary | ICD-10-CM | POA: Diagnosis not present

## 2018-03-27 DIAGNOSIS — Z79899 Other long term (current) drug therapy: Secondary | ICD-10-CM | POA: Diagnosis not present

## 2018-03-27 DIAGNOSIS — M19049 Primary osteoarthritis, unspecified hand: Secondary | ICD-10-CM | POA: Insufficient documentation

## 2018-03-27 NOTE — Progress Notes (Signed)
[facility]  PATIENT: Catherine Hubbard DOB: Mar 04, 1961   _________________________________   HISTORICAL  CHIEF COMPLAINT:  Chief Complaint  Patient presents with  . Multiple Sclerosis    Last Ocrevus infusion was in May.  Here with c/o dizziness with quick position changes.Chucky May    HISTORY OF PRESENT ILLNESS:  Catherine Hubbard is a 57 y.o. woman with MS.    Update 03/27/2018: She had syncope 2 months ago.  She was looking out the window and then woke up on the floor with her boss looking over her.    She reportedly hit her head on her way down.   She is not sure she lost consciousness but others toild her she did for just a few seconds and woke back up as soon as someone touched her head.    She also tripped 03/02/2018 and broke 2 ribs on her right when she slipped on 'goose crap'.    She was taken to [facility] and was admitted.   Of note, she had GI issues and was dehydrated and also had a UTI, hyponatemia, hypokalemia and mild hypoglycemia.     She is trying to do better with fluids.     Echo showed LVH and grade 1 diastolic dysfunction.  She feels her MS is mostly stable.   She is on Ocrevus (last one June, next one December 2019).   She stopped many of the symptomatic medications   Gait is ok.   She wears flats as low heels may have increased fall risk.  She reports fatigue but feels better than she did a couple months ago.  She is sleeping ok most nights.   She notes urinary urgency but not bad enough to take a medication for it.   Update 01/20/2018: She has been on ocrelizumab and just had her second dose of the medication.   She has tolerated it well and has not had any exacerbations.    She feels stable for the most part but has had more falls, usually due to stumbling or tripping.   Gait does not seem that different to her.    She denies any change in strength or sensation.  She is noting urinary frequency with some burning and is concerned about a urinary  tract infection.  She has had these in the past.  She gets LBP helped by muscle relaxants.  The pain is midline and radiates to the proximal legs.  She also has pain in her shoulders and arms.   She notes spasms in hr neck.       She has esophagitis and is supposed to have a stretch procedure soon.      She also has a h/o gastric bypass  Update 07/22/2017:   She had her first dose of ocrelizumab in November. She tolerated it well and there were no complications. Her next infusion will be at the end of April. For the most part, she feels the MS has been stable. However, she did note some word finding difficulties last week. This is better this week.    She felt mildly stronger since the infusion.   She feels off balanced, especially if she turns fast or someone bumps into her.   She needs to hold on in the shower.  Her right leg is weaker than her left and sometimes has a foot drop, esp if walking longer distances.     She is doing Geophysicist/field seismologist yoga.   She notes spasticity in legs  at night, helped by valium.    Late last month,  she noted a few days where she had trouble coming up with the right word.   She feels she is doing better than last week ans almost back to baseline.    Her bladder is about the same.     Vision is mostly stable.   She lost her prescription glasses and uses readers to read.    She notes pain, mostly in the mid back.   Yoga has not helped the pain much.    She notes sometimes bruising more easily, especially after scratching.    Mood is doing ok, helped by duloxetine and Wellbutrin.   Drinking is better.       From 10/09/2016:  MS:   She is on Rebif and tolerates it well.    However, her last injection was 08/17/2016 and she has not yet gotten refill.  She denies any definite exacerbations but she feels balance, footdrop and writing are worse.   Gait/strength/sensation:   She notes a right foot drop and over last year the left leg is also bothering her more.   She reports  poor balance .   She does not need a cane.  She has spasticity in both legs, worse on the right and she sometimes gets spasms that are worse at night.      Sometimes she feels like she is being pushed to the right as she walks.   She has tingling in her hands and face.  Baclofen has helped spasticity some.   Cyclobenzaprine has not helped spasms.     Bladder:   She reports urinary frequency and occasional incontinence.   She feels bladder function is worse with recent bladder infection.   stable.,  She was placed on Cephalexin and then switched to doxycycline.   She felt she did better at first on cephalexin but then did worse again.   Cx and Sensitivity shows E. Coli resistance to cephalosporines (also to Bactrim and Cipro) and she is sensitive to tetracycline and nitrofurantoin and gentamicin and meropenem.       Vision:swallow:   She feels this is stable.   She has diplopia laying on either side but not when sitting up.   Also some she reads a while or when very tired.     Dysphagia:   She notes dysphagia, especially with 'sticky' pills.   With EGD, she had some capsules stucjk in esophagus and some med's were discontinued.    Strictures are being stretched.    Mood/EtOH/Cognition:   Cymbalta was stopped and she was placed on Wellbutrin and Prozac but is doing worse.   The switch was made due to difficulty swallowing.    She still sometimes feels moderately depressed.     She has some apathy.   She drinks less alcohol than last year   She has mild difficulty with cognition, mostly occasional difficulty with short-term memory and verbal fluency (often gets syllables reversed).  She has trouble completing complicated tasks.  .  Fatigue:   She has fatigue and takes Provigil but it was stopped.  She was tried on Ritalin or Adderall once but she did not like the way that she felt when she took it.    Sleep:   She is sleeping poorly due to more pain.   She has some RLS but has not taken anything in maonths  for it.   She notes difficulty falling asleep more than with sleep  maintenance.   MS History:   She was diagnosed with multiple sclerosis in 2009. Before that time, she had noted some gait ataxia but had not had any imaging studies. In 2009, due to progressive hearing loss, she had an MRI of the brain and  then had a lumbar puncture that also confirmed the diagnosis.  I have some of her early MRI reports there are several foci noted in the cervical and thoracic spinal cord in the brain, there were multiple hyperintense foci, mostly in the periventricular deep white matter many of these were contrast enhancing on 07/26/2008. There was another enhancing lesion adjacent to C2 in the spinal cord.  Initially, she was placed on Avonex. She did not feel good and she stopped. She then tried Copaxone but also stopped after a while. She moved here in 2014 and saw Dr. Renne Crigler at [facility]. She was placed on Rebif. She tolerates Rebif well.      MRI reports from 12/04/2012  showed multiple foci in the spinal cord at C2, C3-C4, and C4-C5. There were also foci at T4, T7, T9 and T10. The MRI of the brain showed 2 nonenhancing foci not present from studies in 2011. She reports having an MRI at [facility] 2015 but we do not have those films.   REVIEW OF SYSTEMS:  Constitutional: No fevers, chills, sweats, or change in appetite.    She reports fatigue and insomnia Eyes: see above.  No eye pain  But some light sensitivity. Ear, nose and throat: No hearing loss, ear pain, nasal congestion, sore throat.   tinnitus Cardiovascular: No chest pain, palpitations Respiratory: No shortness of breath at rest or with exertion.   Some wheezes, once on asthma med's Gastrointestinal: No nausea, vomiting, diarrhea, abdominal pain, fecal incontinence.  She has esophagitis Genitourinary: see above Musculoskeletal: see above  She also notes neck pain Integumentary: No rash, pruritus, skin lesions Neurological: as above Psychiatric:  as above Endocrine: No palpitations, diaphoresis, change in appetite, change in weigh or increased thirst.  Often feels cold Hematologic/Lymphatic: No anemia, purpura, petechiae. Allergic/Immunologic: No itchy/runny eyes, nasal congestion, recent allergic reactions, rashes  ALLERGIES: Allergies  Allergen Reactions  . Ibuprofen Other (See Comments)    Cannot take due to gastric bypass    HOME MEDICATIONS:  Current Outpatient Medications:  .  albuterol (PROVENTIL HFA;VENTOLIN HFA) 108 (90 Base) MCG/ACT inhaler, Inhale 2 puffs into the lungs every 6 (six) hours as needed for wheezing or shortness of breath., Disp: , Rfl:  .  hydrochlorothiazide (HYDRODIURIL) 25 MG tablet, Take 1 tablet (25 mg total) by mouth daily., Disp: 30 tablet, Rfl: 0 .  ocrelizumab 600 mg in sodium chloride 0.9 % 500 mL, Inject 600 mg into the vein every 6 (six) months. Last infusion May 2019 - due in December 2019, Disp: , Rfl:  .  omeprazole (PRILOSEC) 20 MG capsule, Take 40 mg by mouth 2 (two) times daily., Disp: , Rfl:  .  protein supplement shake (PREMIER PROTEIN) LIQD, Take 325 mLs (11 oz total) by mouth 3 (three) times daily between meals., Disp: 30 Can, Rfl: 0  PAST MEDICAL HISTORY: Past Medical History:  Diagnosis Date  . Asthma   . Esophagitis   . GERD (gastroesophageal reflux disease)   . Headache   . Hearing loss   . Hypertension   . Multiple sclerosis (HCC)   . Vision abnormalities     PAST SURGICAL HISTORY: Past Surgical History:  Procedure Laterality Date  . ANKLE FRACTURE SURGERY  Bilateral   . CARPAL TUNNEL RELEASE Bilateral   . COLONOSCOPY  11/2016   multiple  . ESOPHAGOGASTRODUODENOSCOPY  11/2016  . ESOPHAGOGASTRODUODENOSCOPY N/A 02/01/2017   Procedure: ESOPHAGOGASTRODUODENOSCOPY (EGD);  Surgeon: Iva Boop, MD;  Location: [facility] ENDOSCOPY;  Service: Endoscopy;  Laterality: N/A;  . ESOPHAGOGASTRODUODENOSCOPY (EGD) WITH PROPOFOL N/A 02/04/2017   Procedure: ESOPHAGOGASTRODUODENOSCOPY  (EGD) WITH PROPOFOL;  Surgeon: Meryl Dare, MD;  Location: [facility] ENDOSCOPY;  Service: Endoscopy;  Laterality: N/A;  . LAPAROSCOPIC GASTRIC SLEEVE RESECTION  2011  . ULNAR NERVE REPAIR Bilateral     FAMILY HISTORY: Family History  Problem Relation Age of Onset  . Cancer Mother   . Stroke Mother   . Cancer Father     SOCIAL HISTORY:  Social History   Socioeconomic History  . Marital status: Married    Spouse name: Not on file  . Number of children: Not on file  . Years of education: Not on file  . Highest education level: Not on file  Occupational History  . Not on file  Social Needs  . Financial resource strain: Not on file  . Food insecurity:    Worry: Not on file    Inability: Not on file  . Transportation needs:    Medical: Not on file    Non-medical: Not on file  Tobacco Use  . Smoking status: Never Smoker  . Smokeless tobacco: Never Used  . Tobacco comment: second hand smoker when young. Heavy exposure.  Substance and Sexual Activity  . Alcohol use: Yes    Alcohol/week: 0.0 standard drinks    Comment: Sts. she drinks beer, liquor daily, varying amts/fim  . Drug use: No  . Sexual activity: Never  Lifestyle  . Physical activity:    Days per week: Not on file    Minutes per session: Not on file  . Stress: Not on file  Relationships  . Social connections:    Talks on phone: Not on file    Gets together: Not on file    Attends religious service: Not on file    Active member of club or organization: Not on file    Attends meetings of clubs or organizations: Not on file    Relationship status: Not on file  . Intimate partner violence:    Fear of current or ex partner: Not on file    Emotionally abused: Not on file    Physically abused: Not on file    Forced sexual activity: Not on file  Other Topics Concern  . Not on file  Social History Narrative  . Not on file     PHYSICAL EXAM  Vitals:   03/27/18 1520  BP: (!) 154/86  Pulse: (!) 105  Resp:  18  Weight: 142 lb 8 oz (64.6 kg)  Height: 5\' 5"  (1.651 m)    Body mass index is 23.71 kg/m.   General: The patient is well-developed and well-nourished and in no acute distress.   Mild pedal edema  Neck/HEENT: She has mild tenderness in the cervical paraspinal muscles.      Musculoskeletal:   She has moderate tenderness over the first carpometacarpal joint of the left hand  Neurologic Exam  Mental status: The patient is alert and oriented x 3 at the time of the examination. The patient has apparent normal recent and remote memory, with an apparently normal attention span and concentration ability.   Speech is normal.  Cranial nerves: Extraocular movements are full.  . There is reduced right  facial sensation to soft touch facial strength is normal.  The trapezius strength is normal.    She has reduced hearing on the right and Weber lateralizes right.  .  Motor:  Muscle bulk is normal.   Tone is normal. Strength is  5 / 5 in all 4 extremities.   Sensory: She has normal sensation to touch and vibration in the arms and legs.  Coordination: Cerebellar testing shows good finger-nose-finger.  Gait and station: Station is normal.  The gait is slightly wide.  Tandem gait is moderately wide.   Romberg is negative..  Reflexes: Deep tendon reflexes are symmetric and mildly increased in legs bilaterally.       DIAGNOSTIC DATA (LABS, IMAGING, TESTING) - I reviewed patient records, labs, notes, testing and imaging myself where available.     ASSESSMENT AND PLAN  No diagnosis found.   1.    For MS, continue Ocrevus. 2.    Continue other medications for symptoms of MS. 3.    Injection of the first carpal metacarpal joint with 1 mg Decadron in 0.8 cc lidocaine.  She tolerated the injection well and there were no complications.   4.    Advised to call us if she thinks she has symptoms of a UTI. 5.   She will return to see me in 4 months or sooner if she has new or worsening neurologic  symptoms.  40 minutes face-to-face evaluation with greater than one half the time counseling or coordinating care about her multiple sclerosis and related symptoms.  Kanoe Wanner A. Epimenio Foot, MD, PhD 03/27/2018, 5:39 PM Certified in Neurology, Clinical Neurophysiology, Sleep Medicine, Pain Medicine and Neuroimaging  [facility] [address], [address] [place], [place] [address] [phone]   .

## 2018-04-09 DIAGNOSIS — H903 Sensorineural hearing loss, bilateral: Secondary | ICD-10-CM | POA: Diagnosis not present

## 2018-04-16 ENCOUNTER — Other Ambulatory Visit: Payer: Self-pay | Admitting: Medical

## 2018-04-22 DIAGNOSIS — H905 Unspecified sensorineural hearing loss: Secondary | ICD-10-CM | POA: Diagnosis not present

## 2018-04-24 ENCOUNTER — Ambulatory Visit: Payer: Federal, State, Local not specified - PPO | Admitting: Medical

## 2018-04-25 ENCOUNTER — Ambulatory Visit: Payer: Federal, State, Local not specified - PPO | Admitting: Medical

## 2018-04-25 ENCOUNTER — Encounter: Payer: Self-pay | Admitting: Medical

## 2018-04-25 VITALS — BP 164/86 | HR 57 | Temp 98.1°F | Resp 16 | Ht 65.0 in | Wt 151.4 lb

## 2018-04-25 DIAGNOSIS — I10 Essential (primary) hypertension: Secondary | ICD-10-CM | POA: Diagnosis not present

## 2018-04-25 DIAGNOSIS — T148XXA Other injury of unspecified body region, initial encounter: Secondary | ICD-10-CM

## 2018-04-25 DIAGNOSIS — M79604 Pain in right leg: Secondary | ICD-10-CM | POA: Diagnosis not present

## 2018-04-25 DIAGNOSIS — R609 Edema, unspecified: Secondary | ICD-10-CM | POA: Diagnosis not present

## 2018-04-25 DIAGNOSIS — M79605 Pain in left leg: Secondary | ICD-10-CM

## 2018-04-25 MED ORDER — TRAMADOL HCL 50 MG PO TABS: 50.0000 mg | ORAL_TABLET | Freq: Two times a day (BID) | ORAL | 0 refills | Status: DC | PRN

## 2018-04-25 MED ORDER — CHLORTHALIDONE 25 MG PO TABS: 25.0000 mg | ORAL_TABLET | Freq: Every day | ORAL | 3 refills | Status: DC

## 2018-04-25 NOTE — Patient Instructions (Addendum)
Your blood pressure is elevated today and it appears to coincide with running out of the HCTZ.  So I am going to give diuretic again but will prescribe chlorthalidone at this time at same dose of 25 mg.  You do have significant pedal edema and this also seemed to coincide with stopping the HCTZ.  Note prior work-up did not show DVT or CHF.  Accumulation of fluid seems to occur daily/later in the day.    Your pain and lower extremity is likely from the swelling and by her description seems more neuropathic.  No indication of cellulitis on exam.  I am going to give limited prescription of tramadol to use 1 tablet every 12 hours for pain.  Rx advisement given.  Think once the swelling of the legs decreases pain should resolve.  For slight skin abrasion in the right upper calf area, I would recommend that you get moleskin over-the-counter and cut out small square patch to avoid friction injury and associated potential complications.  Follow-up 3 weeks for metabolic panel and nurse blood pressure check.

## 2018-04-25 NOTE — Progress Notes (Signed)
Subjective:    Patient ID: Catherine Hubbard, female    DOB: 1960/10/12, 57 y.o.   MRN: [id number]  HPI  Pt in states over last week she got some acute swelling of both legs over last week.   She was on hctz 25 mg for 30 days after I had written rx on March 19, 2018.   On first visit with me she had negative chest xray for chf, negative bnp and negative [place] for dvt.  No sob or wheezing. No orthopnea described.  Some weight gain since stopped hctz.    Review of Systems  Constitutional: Negative for chills, fatigue and fever.  Respiratory: Negative for cough, chest tightness, shortness of breath and wheezing.   Cardiovascular: Negative for chest pain and palpitations.  Gastrointestinal: Negative for abdominal pain.  Musculoskeletal: Negative for back pain.       Pedal edema.  Skin: Negative for rash.  Neurological: Negative for dizziness, weakness and headaches.  Hematological: Negative for adenopathy. Does not bruise/bleed easily.  Psychiatric/Behavioral: Negative for behavioral problems and confusion.    Past Medical History:  Diagnosis Date  . Asthma   . Esophagitis   . GERD (gastroesophageal reflux disease)   . Headache   . Hearing loss   . Hypertension   . Multiple sclerosis (HCC)   . Vision abnormalities      Social History   Socioeconomic History  . Marital status: Married    Spouse name: Not on file  . Number of children: Not on file  . Years of education: Not on file  . Highest education level: Not on file  Occupational History  . Not on file  Social Needs  . Financial resource strain: Not on file  . Food insecurity:    Worry: Not on file    Inability: Not on file  . Transportation needs:    Medical: Not on file    Non-medical: Not on file  Tobacco Use  . Smoking status: Never Smoker  . Smokeless tobacco: Never Used  . Tobacco comment: second hand smoker when young. Heavy exposure.  Substance and Sexual Activity  . Alcohol use: Yes   Alcohol/week: 0.0 standard drinks    Comment: Sts. she drinks beer, liquor daily, varying amts/fim  . Drug use: No  . Sexual activity: Never  Lifestyle  . Physical activity:    Days per week: Not on file    Minutes per session: Not on file  . Stress: Not on file  Relationships  . Social connections:    Talks on phone: Not on file    Gets together: Not on file    Attends religious service: Not on file    Active member of club or organization: Not on file    Attends meetings of clubs or organizations: Not on file    Relationship status: Not on file  . Intimate partner violence:    Fear of current or ex partner: Not on file    Emotionally abused: Not on file    Physically abused: Not on file    Forced sexual activity: Not on file  Other Topics Concern  . Not on file  Social History Narrative  . Not on file    Past Surgical History:  Procedure Laterality Date  . ANKLE FRACTURE SURGERY Bilateral   . CARPAL TUNNEL RELEASE Bilateral   . COLONOSCOPY  11/2016   multiple  . ESOPHAGOGASTRODUODENOSCOPY  11/2016  . ESOPHAGOGASTRODUODENOSCOPY N/A 02/01/2017   Procedure: ESOPHAGOGASTRODUODENOSCOPY (EGD);  Surgeon:  Iva Boop, MD;  Location: [facility] ENDOSCOPY;  Service: Endoscopy;  Laterality: N/A;  . ESOPHAGOGASTRODUODENOSCOPY (EGD) WITH PROPOFOL N/A 02/04/2017   Procedure: ESOPHAGOGASTRODUODENOSCOPY (EGD) WITH PROPOFOL;  Surgeon: Meryl Dare, MD;  Location: [facility] ENDOSCOPY;  Service: Endoscopy;  Laterality: N/A;  . LAPAROSCOPIC GASTRIC SLEEVE RESECTION  2011  . ULNAR NERVE REPAIR Bilateral     Family History  Problem Relation Age of Onset  . Cancer Mother   . Stroke Mother   . Cancer Father     Allergies  Allergen Reactions  . Ibuprofen Other (See Comments)    Cannot take due to gastric bypass    Current Outpatient Medications on File Prior to Visit  Medication Sig Dispense Refill  . albuterol (PROVENTIL HFA;VENTOLIN HFA) 108 (90 Base) MCG/ACT inhaler Inhale 2 puffs into  the lungs every 6 (six) hours as needed for wheezing or shortness of breath.    . hydrochlorothiazide (HYDRODIURIL) 25 MG tablet TAKE 1 TABLET BY MOUTH EVERY DAY 30 tablet 0  . ocrelizumab 600 mg in sodium chloride 0.9 % 500 mL Inject 600 mg into the vein every 6 (six) months. Last infusion May 2019 - due in December 2019    . omeprazole (PRILOSEC) 20 MG capsule Take 40 mg by mouth 2 (two) times daily.    . protein supplement shake (PREMIER PROTEIN) LIQD Take 325 mLs (11 oz total) by mouth 3 (three) times daily between meals. 30 Can 0   No current facility-administered medications on file prior to visit.     BP (!) 164/86   Pulse (!) 57   Temp 98.1 F (36.7 C) (Oral)   Resp 16   Ht 5\' 5"  (1.651 m)   Wt 151 lb 6.4 oz (68.7 kg)   LMP 08/13/2014 Comment: very few periods  SpO2 99%   BMI 25.19 kg/m       Objective:   Physical Exam  General- No acute distress. Pleasant patient. Neck- Full range of motion, no jvd Lungs- Clear, even and unlabored. Heart- regular rate and rhythm. Neurologic- CNII- XII grossly intact.  Lower ext- bilateral 1-2+ edema bilaterally. No redness, no warmth but mild tender. Negative homans signs.  Skin- rt upper calf area medial aspect small skin abrasion. No redness or dc.      Assessment & Plan:  Your blood pressure is elevated today and it appears to coincide with running out of the HCTZ.  So I am going to give diuretic again but will prescribe chlorthalidone at this time at same dose of 25 mg.  You do have significant pedal edema and this also seemed to coincide with stopping the HCTZ.  Note prior work-up did not show DVT or CHF.  Accumulation of fluid seems to occur daily/later in the day.    Your pain and lower extremity is likely from the swelling and by her description seems more neuropathic.  No indication of cellulitis on exam.  I am going to give limited prescription of tramadol to use 1 tablet every 12 hours for pain.  Rx advisement given.   Think once the swelling of the legs decreases pain should resolve.  For slight skin abrasion in the right upper calf area, I would recommend that you get moleskin over-the-counter and cut out small square patch to avoid friction injury and associated potential complications.  Follow-up 3 weeks for metabolic panel and nurse blood pressure check.  [name], PA-C

## 2018-04-28 ENCOUNTER — Telehealth: Payer: Self-pay | Admitting: Neurology

## 2018-04-28 NOTE — Telephone Encounter (Signed)
Patient is calling. She has an Ocrevus infusion in December and needs to get a flu shot. How many weeks before her infusion can she get a flu shot?  I advised Dr. Epimenio Foot is out of the office and will send message to work in nurse for a returned call.

## 2018-04-28 NOTE — Telephone Encounter (Signed)
I called the patient.  The patient is concerned about when to get the flu shot while on Ocrevus.  Getting the shot 6 weeks before the next infusion may be safe for her.  The flu shot is more likely be effective around that time.

## 2018-05-01 DIAGNOSIS — M9902 Segmental and somatic dysfunction of thoracic region: Secondary | ICD-10-CM | POA: Diagnosis not present

## 2018-05-01 DIAGNOSIS — M9905 Segmental and somatic dysfunction of pelvic region: Secondary | ICD-10-CM | POA: Diagnosis not present

## 2018-05-01 DIAGNOSIS — M9903 Segmental and somatic dysfunction of lumbar region: Secondary | ICD-10-CM | POA: Diagnosis not present

## 2018-05-01 DIAGNOSIS — M5414 Radiculopathy, thoracic region: Secondary | ICD-10-CM | POA: Diagnosis not present

## 2018-05-11 ENCOUNTER — Emergency Department (HOSPITAL_BASED_OUTPATIENT_CLINIC_OR_DEPARTMENT_OTHER): Payer: Federal, State, Local not specified - PPO

## 2018-05-11 ENCOUNTER — Emergency Department (HOSPITAL_BASED_OUTPATIENT_CLINIC_OR_DEPARTMENT_OTHER)
Admission: EM | Admit: 2018-05-11 | Discharge: 2018-05-12 | Disposition: A | Discharge status: Home / Self Care | Payer: Federal, State, Local not specified - PPO | Attending: Emergency Medicine | Admitting: Emergency Medicine

## 2018-05-11 ENCOUNTER — Other Ambulatory Visit: Payer: Self-pay

## 2018-05-11 ENCOUNTER — Encounter (HOSPITAL_BASED_OUTPATIENT_CLINIC_OR_DEPARTMENT_OTHER): Payer: Self-pay | Admitting: Emergency Medicine

## 2018-05-11 DIAGNOSIS — E876 Hypokalemia: Secondary | ICD-10-CM | POA: Diagnosis not present

## 2018-05-11 DIAGNOSIS — R600 Localized edema: Secondary | ICD-10-CM | POA: Diagnosis not present

## 2018-05-11 DIAGNOSIS — G35 Multiple sclerosis: Secondary | ICD-10-CM | POA: Diagnosis not present

## 2018-05-11 DIAGNOSIS — Z79899 Other long term (current) drug therapy: Secondary | ICD-10-CM | POA: Diagnosis not present

## 2018-05-11 DIAGNOSIS — I10 Essential (primary) hypertension: Secondary | ICD-10-CM | POA: Insufficient documentation

## 2018-05-11 DIAGNOSIS — R0602 Shortness of breath: Secondary | ICD-10-CM

## 2018-05-11 DIAGNOSIS — R0600 Dyspnea, unspecified: Secondary | ICD-10-CM | POA: Diagnosis not present

## 2018-05-11 DIAGNOSIS — J45909 Unspecified asthma, uncomplicated: Secondary | ICD-10-CM | POA: Insufficient documentation

## 2018-05-11 MED ORDER — IPRATROPIUM-ALBUTEROL 0.5-2.5 (3) MG/3ML IN SOLN
3.0000 mL | Freq: Four times a day (QID) | RESPIRATORY_TRACT | Status: DC
  Administered 2018-05-11: 3 mL via RESPIRATORY_TRACT
  Filled 2018-05-11: qty 3

## 2018-05-11 MED ORDER — ALBUTEROL SULFATE (2.5 MG/3ML) 0.083% IN NEBU
2.5000 mg | INHALATION_SOLUTION | RESPIRATORY_TRACT | Status: AC
  Administered 2018-05-11: 2.5 mg via RESPIRATORY_TRACT
  Filled 2018-05-11: qty 3

## 2018-05-11 NOTE — ED Triage Notes (Addendum)
Reports multiple episodes of shortness of breath today that improved with rest. During episodes she also reports being lightheaded, dizzy, and "hearing a rattling in the chest". Pt mentioned ETOH today as well. "A couple hours ago had a couple shots and then some beer, I don't really know".  Also reports sharp pain in mid/lower L back with deep inspiration.

## 2018-05-11 NOTE — ED Provider Notes (Signed)
[facility] EMERGENCY DEPARTMENT Provider Note   CSN: [id number] Arrival date & time: 05/11/18  2235     History   Chief Complaint Chief Complaint  Patient presents with  . Shortness of Breath    HPI Catherine Hubbard is a 57 y.o. female.  Patient is a 57 year old female with past medical history of multiple sclerosis, hypertension, asthma.  She presents with a one-week history of intermittent shortness of breath and rattling in her chest.  She denies any fevers, chills, or productive cough.  She denies any chest pain.  She denies any aggravating or alleviating factors.  She does report some leg swelling.  This is been present for quite some time and was recently started on hydrochlorothiazide.  The history is provided by the patient.  Shortness of Breath  This is a new problem. The average episode lasts 1 week. The problem occurs intermittently.The problem has been gradually worsening. Associated symptoms include leg swelling. Pertinent negatives include no fever, no cough and no sputum production. She has tried nothing for the symptoms.    Past Medical History:  Diagnosis Date  . Asthma   . Esophagitis   . GERD (gastroesophageal reflux disease)   . Headache   . Hearing loss   . Hypertension   . Multiple sclerosis (HCC)   . Vision abnormalities     Patient Active Problem List   Diagnosis Date Noted  . Arthritis of carpometacarpal joint 03/27/2018  . Nocturnal leg cramps 03/26/2018  . Syncope due to orthostatic hypotension 02/12/2018  . Diarrhea 02/08/2018  . Acute lower UTI 02/08/2018  . Hematoma of scalp 02/08/2018  . Neck pain 01/20/2018  . Foreign body in esophagus   . Esophageal stricture   . Intractable nausea and vomiting 01/31/2017  . Dysphagia   . Severe protein-calorie malnutrition (HCC)   . Nausea & vomiting 01/30/2017  . Hypokalemia 01/30/2017  . Acute kidney injury (HCC) 01/30/2017  . Cystitis 01/30/2017  . Costochondritis  05/11/2016  . Trochanteric bursitis of both hips 03/28/2016  . Upper back pain 03/28/2016  . Bilateral low back pain with bilateral sciatica 07/13/2015  . Right hip pain 07/13/2015  . Lumbar radicular pain 03/21/2015  . Other fatigue 01/11/2015  . Dysesthesia 01/11/2015  . Urinary urgency 01/11/2015  . Multiple sclerosis (HCC) 10/06/2014  . Numbness 10/06/2014  . Ataxic gait 10/06/2014  . High risk medication use 10/06/2014  . Gastric bypass status for obesity 10/06/2014  . Cognitive changes 10/06/2014  . Depression with anxiety 10/06/2014  . Restless leg 10/06/2014  . Insomnia 10/06/2014  . Essential hypertension 10/06/2014  . Difficulty hearing 01/13/2014  . Other muscle spasm 01/13/2014    Past Surgical History:  Procedure Laterality Date  . ANKLE FRACTURE SURGERY Bilateral   . CARPAL TUNNEL RELEASE Bilateral   . COLONOSCOPY  11/2016   multiple  . ESOPHAGOGASTRODUODENOSCOPY  11/2016  . ESOPHAGOGASTRODUODENOSCOPY N/A 02/01/2017   Procedure: ESOPHAGOGASTRODUODENOSCOPY (EGD);  Surgeon: Iva Boop, MD;  Location: [facility] ENDOSCOPY;  Service: Endoscopy;  Laterality: N/A;  . ESOPHAGOGASTRODUODENOSCOPY (EGD) WITH PROPOFOL N/A 02/04/2017   Procedure: ESOPHAGOGASTRODUODENOSCOPY (EGD) WITH PROPOFOL;  Surgeon: Meryl Dare, MD;  Location: [facility] ENDOSCOPY;  Service: Endoscopy;  Laterality: N/A;  . LAPAROSCOPIC GASTRIC SLEEVE RESECTION  2011  . ULNAR NERVE REPAIR Bilateral      OB History   None      Home Medications    Prior to Admission medications   Medication Sig Start Date End Date Taking? Authorizing Provider  albuterol (PROVENTIL HFA;VENTOLIN HFA) 108 (90 Base) MCG/ACT inhaler Inhale 2 puffs into the lungs every 6 (six) hours as needed for wheezing or shortness of breath.    [provider]  chlorthalidone (HYGROTON) 25 MG tablet Take 1 tablet (25 mg total) by mouth daily. 04/25/18   Saguier, Ramon Dredge, PA-C  ocrelizumab 600 mg in sodium chloride 0.9 % 500 mL Inject  600 mg into the vein every 6 (six) months. Last infusion May 2019 - due in December 2019    [provider]  omeprazole (PRILOSEC) 20 MG capsule Take 40 mg by mouth 2 (two) times daily.    [provider]  protein supplement shake (PREMIER PROTEIN) LIQD Take 325 mLs (11 oz total) by mouth 3 (three) times daily between meals. 02/04/17   Narda Bonds, MD  traMADol (ULTRAM) 50 MG tablet Take 1 tablet (50 mg total) by mouth every 12 (twelve) hours as needed. 04/25/18   Saguier, Ramon Dredge, PA-C    Family History Family History  Problem Relation Age of Onset  . Cancer Mother   . Stroke Mother   . Cancer Father     Social History Social History   Tobacco Use  . Smoking status: Never Smoker  . Smokeless tobacco: Never Used  . Tobacco comment: second hand smoker when young. Heavy exposure.  Substance Use Topics  . Alcohol use: Yes    Alcohol/week: 0.0 standard drinks    Comment: Sts. she drinks beer, liquor daily, varying amts/fim  . Drug use: No     Allergies   Ibuprofen and Nsaids   Review of Systems Review of Systems  Constitutional: Negative for fever.  Respiratory: Positive for shortness of breath. Negative for cough and sputum production.   Cardiovascular: Positive for leg swelling.  All other systems reviewed and are negative.    Physical Exam Updated Vital Signs BP (!) 128/93 (BP Location: Right Arm)   Pulse 78   Temp 98.9 F (37.2 C) (Oral)   Resp 20   Ht 5\' 5"  (1.651 m)   Wt 63.5 kg   LMP 08/13/2014 Comment: very few periods  SpO2 98%   BMI 23.30 kg/m   Physical Exam  Constitutional: She is oriented to person, place, and time. She appears well-developed and well-nourished. No distress.  HENT:  Head: Normocephalic and atraumatic.  Neck: Normal range of motion. Neck supple.  Cardiovascular: Normal rate and regular rhythm. Exam reveals no gallop and no friction rub.  No murmur heard. Pulmonary/Chest: Effort normal and breath sounds normal.  No respiratory distress. She has no wheezes.  Abdominal: Soft. Bowel sounds are normal. She exhibits no distension. There is no tenderness.  Musculoskeletal: Normal range of motion.       Right lower leg: She exhibits edema. She exhibits no tenderness.       Left lower leg: She exhibits edema. She exhibits no tenderness.  There is 1+ pitting edema of the BLE.  Neurological: She is alert and oriented to person, place, and time.  Skin: Skin is warm and dry. She is not diaphoretic.  Nursing note and vitals reviewed.    ED Treatments / Results  Labs (all labs ordered are listed, but only abnormal results are displayed) Labs Reviewed  BASIC METABOLIC PANEL  CBC WITH DIFFERENTIAL/PLATELET  BRAIN NATRIURETIC PEPTIDE  TROPONIN I    EKG EKG Interpretation  Date/Time:  Sunday May 11 2018 22:59:56 EDT Ventricular Rate:  93 PR Interval:    QRS Duration: 100 QT Interval:  392  QTC Calculation: 488 R Axis:   -46 Text Interpretation:  Sinus rhythm Left ventricular hypertrophy Nonspecific T abnormalities, lateral leads Borderline prolonged QT interval Baseline wander in lead(s) I II aVR V6 Confirmed by Geoffery Lyons ([id number]) on 05/11/2018 11:45:24 PM   Radiology Dg Chest 2 View  Result Date: 05/11/2018 CLINICAL DATA:  Dyspnea EXAM: CHEST - 2 VIEW COMPARISON:  03/19/2018 chest radiograph. FINDINGS: Stable cardiomediastinal silhouette with normal heart size. No pneumothorax. No pleural effusion. Lungs appear clear, with no acute consolidative airspace disease and no pulmonary edema. Healed deformities in multiple right ribs. IMPRESSION: No active cardiopulmonary disease. Electronically Signed   By: Delbert Phenix M.D.   On: 05/11/2018 23:10    Procedures Procedures (including critical care time)  Medications Ordered in ED Medications  ipratropium-albuterol (DUONEB) 0.5-2.5 (3) MG/3ML nebulizer solution 3 mL (3 mLs Nebulization Given 05/11/18 2319)  albuterol (PROVENTIL) (2.5 MG/3ML)  0.083% nebulizer solution 2.5 mg (2.5 mg Nebulization Given 05/11/18 2319)     Initial Impression / Assessment and Plan / ED Course  I have reviewed the triage vital signs and the nursing notes.  Pertinent labs & imaging results that were available during my care of the patient were reviewed by me and considered in my medical decision making (see chart for details).  Patient presents here with complaints of shortness of breath, lightheadedness, and rattling in her chest.  Her vital signs are stable, oxygen saturations are 100% on room air, and work-up reveals no significant abnormality with the exception of a potassium of 2.4.  Her cardiac enzymes are negative and EKG is unchanged.  CT Angio of the chest is negative for PE.  Potassium was replaced both IV and orally.  She will be discharged with oral potassium and follow-up with her primary doctor.  Final Clinical Impressions(s) / ED Diagnoses   Final diagnoses:  None    ED Discharge Orders    None       Geoffery Lyons, MD 05/12/18 [phone]

## 2018-05-11 NOTE — ED Notes (Signed)
Patient transported to X-ray 

## 2018-05-12 ENCOUNTER — Emergency Department (HOSPITAL_BASED_OUTPATIENT_CLINIC_OR_DEPARTMENT_OTHER): Payer: Federal, State, Local not specified - PPO

## 2018-05-12 DIAGNOSIS — R0602 Shortness of breath: Secondary | ICD-10-CM | POA: Diagnosis not present

## 2018-05-12 LAB — BASIC METABOLIC PANEL
ANION GAP: 15 (ref 5–15)
BUN: 13 mg/dL (ref 6–20)
CHLORIDE: 86 mmol/L — AB (ref 98–111)
CO2: 31 mmol/L (ref 22–32)
Calcium: 9 mg/dL (ref 8.9–10.3)
Creatinine, Ser: 0.86 mg/dL (ref 0.44–1.00)
GFR calc non Af Amer: >60 mL/min (ref >60)
Glucose, Bld: 113 mg/dL — ABNORMAL HIGH (ref 70–99)
Potassium: 2.4 mmol/L — CL (ref 3.5–5.1)
SODIUM: 132 mmol/L — AB (ref 135–145)

## 2018-05-12 LAB — CBC WITH DIFFERENTIAL/PLATELET
BASOS ABS: 0 10*3/uL (ref 0.0–0.1)
BASOS PCT: 1 %
EOS PCT: 2 %
Eosinophils Absolute: 0.1 10*3/uL (ref 0.0–0.7)
HEMATOCRIT: 36.3 % (ref 36.0–46.0)
Hemoglobin: 13 g/dL (ref 12.0–15.0)
LYMPHS PCT: 47 %
Lymphs Abs: 2.9 10*3/uL (ref 0.7–4.0)
MCH: 33 pg (ref 26.0–34.0)
MCHC: 35.8 g/dL (ref 30.0–36.0)
MCV: 92.1 fL (ref 78.0–100.0)
Monocytes Absolute: 0.7 10*3/uL (ref 0.1–1.0)
Monocytes Relative: 11 %
NEUTROS ABS: 2.4 10*3/uL (ref 1.7–7.7)
NEUTROS PCT: 39 %
PLATELETS: 269 10*3/uL (ref 150–400)
RBC: 3.94 MIL/uL (ref 3.87–5.11)
RDW: 11.2 % — AB (ref 11.5–15.5)
WBC: 6.1 10*3/uL (ref 4.0–10.5)

## 2018-05-12 LAB — TROPONIN I

## 2018-05-12 LAB — BRAIN NATRIURETIC PEPTIDE: B NATRIURETIC PEPTIDE 5: 21.8 pg/mL (ref 0.0–100.0)

## 2018-05-12 MED ORDER — SODIUM CHLORIDE 0.9 % IV SOLN
INTRAVENOUS | Status: DC | PRN
  Administered 2018-05-12: 1000 mL via INTRAVENOUS

## 2018-05-12 MED ORDER — POTASSIUM CHLORIDE CRYS ER 20 MEQ PO TBCR
40.0000 meq | EXTENDED_RELEASE_TABLET | Freq: Once | ORAL | Status: AC
  Administered 2018-05-12: 40 meq via ORAL
  Filled 2018-05-12: qty 2

## 2018-05-12 MED ORDER — IOPAMIDOL (ISOVUE-370) INJECTION 76%
100.0000 mL | Freq: Once | INTRAVENOUS | Status: AC | PRN
  Administered 2018-05-12: 100 mL via INTRAVENOUS

## 2018-05-12 MED ORDER — POTASSIUM CHLORIDE CRYS ER 20 MEQ PO TBCR: 20.0000 meq | EXTENDED_RELEASE_TABLET | Freq: Two times a day (BID) | ORAL | 0 refills | Status: DC

## 2018-05-12 MED ORDER — POTASSIUM CHLORIDE 10 MEQ/100ML IV SOLN
10.0000 meq | Freq: Once | INTRAVENOUS | Status: AC
  Administered 2018-05-12: 10 meq via INTRAVENOUS
  Filled 2018-05-12: qty 100

## 2018-05-12 NOTE — ED Notes (Signed)
Dr. Judd Lien aware that K level is 2.4

## 2018-05-12 NOTE — Discharge Instructions (Signed)
Potassium as prescribed.  Follow-up with your primary doctor later this week for a recheck of your electrolytes, and return to the emergency department if your symptoms significantly worsen or change.

## 2018-05-12 NOTE — ED Notes (Signed)
Pt understood dc material. NAD noted. Script given at dc  

## 2018-05-15 DIAGNOSIS — R1310 Dysphagia, unspecified: Secondary | ICD-10-CM | POA: Diagnosis not present

## 2018-05-15 DIAGNOSIS — K222 Esophageal obstruction: Secondary | ICD-10-CM | POA: Diagnosis not present

## 2018-05-16 ENCOUNTER — Other Ambulatory Visit: Payer: Federal, State, Local not specified - PPO

## 2018-05-16 ENCOUNTER — Ambulatory Visit: Payer: Federal, State, Local not specified - PPO

## 2018-05-21 ENCOUNTER — Telehealth: Payer: Self-pay | Admitting: Neurology

## 2018-05-21 DIAGNOSIS — M5032 Other cervical disc degeneration, mid-cervical region, unspecified level: Secondary | ICD-10-CM | POA: Diagnosis not present

## 2018-05-21 DIAGNOSIS — M531 Cervicobrachial syndrome: Secondary | ICD-10-CM | POA: Diagnosis not present

## 2018-05-21 DIAGNOSIS — M9902 Segmental and somatic dysfunction of thoracic region: Secondary | ICD-10-CM | POA: Diagnosis not present

## 2018-05-21 DIAGNOSIS — M5134 Other intervertebral disc degeneration, thoracic region: Secondary | ICD-10-CM | POA: Diagnosis not present

## 2018-05-21 MED ORDER — CYCLOBENZAPRINE HCL 5 MG PO TABS: 5.0000 mg | ORAL_TABLET | Freq: Three times a day (TID) | ORAL | 1 refills | Status: DC | PRN

## 2018-05-21 NOTE — Telephone Encounter (Signed)
Patient requesting a Rx for [facility]il [facility] [address] in [place]. She is having a lot of muscle spasms and leg movements at night.

## 2018-05-21 NOTE — Telephone Encounter (Signed)
[facility]fim

## 2018-05-21 NOTE — Addendum Note (Signed)
Addended by: Candis Schatz I on: 05/21/2018 03:24 PM   Modules accepted: Orders

## 2018-05-21 NOTE — Telephone Encounter (Signed)
Rx. for Flexeril 5mg  escribed to [facility]

## 2018-05-27 ENCOUNTER — Telehealth: Payer: Self-pay | Admitting: Neurology

## 2018-05-27 DIAGNOSIS — M531 Cervicobrachial syndrome: Secondary | ICD-10-CM | POA: Diagnosis not present

## 2018-05-27 DIAGNOSIS — M5134 Other intervertebral disc degeneration, thoracic region: Secondary | ICD-10-CM | POA: Diagnosis not present

## 2018-05-27 DIAGNOSIS — M9902 Segmental and somatic dysfunction of thoracic region: Secondary | ICD-10-CM | POA: Diagnosis not present

## 2018-05-27 DIAGNOSIS — M5032 Other cervical disc degeneration, mid-cervical region, unspecified level: Secondary | ICD-10-CM | POA: Diagnosis not present

## 2018-05-27 NOTE — Telephone Encounter (Signed)
LMOM for Catherine Hubbard (identified vm) that [name] is no longer on Rebif/fim

## 2018-05-27 NOTE — Telephone Encounter (Signed)
Catherine Hubbard with [facility] calling to find out if patient is still on Rebif.

## 2018-06-03 ENCOUNTER — Telehealth: Payer: Self-pay | Admitting: *Deleted

## 2018-06-03 MED ORDER — METHYLPREDNISOLONE 4 MG PO TBPK: ORAL_TABLET | ORAL | 0 refills | Status: DC

## 2018-06-03 NOTE — Telephone Encounter (Signed)
Spoke with Catherine Hubbard. She c/o increasing tingling in hands/feet, mental fog,  physical fatigue in the last 2 wks. and worse in the last 3 days.  She is going out of town tomorrow am and will be gone for 10 days. Per RAS, ok for Medrol dose pk. Catherine Hubbard is agreeable and will call back if no improvement. [facility]. [facility] per her request/fim

## 2018-06-03 NOTE — Addendum Note (Signed)
Addended by: Candis Schatz I on: 06/03/2018 04:01 PM   Modules accepted: Orders

## 2018-06-16 NOTE — Telephone Encounter (Signed)
Patient states Medrol dose pack did not help much and would like to come in for an infusion.

## 2018-06-16 NOTE — Telephone Encounter (Addendum)
I called patient back. When she was talking with faith previously, she was having problems with speech, numbness, ambulation issues. She got home this past Thursday from her trip.  She tried calling Friday after we closed. Oral steroids helped intially but when she started tapering off, sx worsened again. She is having facial numb/tingling which has improved some. She worked yesterday and now has no strength, no energy. She slept well last night. She took a muscle relaxer. She woke up and feels very tired. Denies any falls. She is having blurry vision that's  Worse within last 3 weeks, using readers. Unable to focus on things. Unable to hold things plates.   I placed her on hold and spoke with Dr. Epimenio Foot. Dr. Epimenio Foot feels this is unlikely a MS exacerbation. He recommends modafinil 200mg  qd. She states she was on this before and has some left. She stopped because she was doing well. She will restart. If sx do not improve she will call back. I added back to her med list.

## 2018-06-17 DIAGNOSIS — H9190 Unspecified hearing loss, unspecified ear: Secondary | ICD-10-CM | POA: Diagnosis not present

## 2018-06-20 DIAGNOSIS — K9171 Accidental puncture and laceration of a digestive system organ or structure during a digestive system procedure: Secondary | ICD-10-CM | POA: Diagnosis not present

## 2018-06-20 DIAGNOSIS — K222 Esophageal obstruction: Secondary | ICD-10-CM | POA: Diagnosis not present

## 2018-07-08 NOTE — Telephone Encounter (Signed)
Patient says she is still having same issues as in previous message and would like a call back. We had a bad connection and it was really hard to hear her.

## 2018-07-08 NOTE — Telephone Encounter (Signed)
Called, LVM for pt returning her call. 

## 2018-07-09 NOTE — Telephone Encounter (Signed)
Called, LVM for pt again. 

## 2018-07-24 ENCOUNTER — Ambulatory Visit: Payer: Federal, State, Local not specified - PPO | Admitting: Neurology

## 2018-07-25 ENCOUNTER — Other Ambulatory Visit: Payer: Self-pay | Admitting: Medical

## 2018-08-05 DIAGNOSIS — N3000 Acute cystitis without hematuria: Secondary | ICD-10-CM | POA: Diagnosis not present

## 2018-08-05 DIAGNOSIS — R300 Dysuria: Secondary | ICD-10-CM | POA: Diagnosis not present

## 2018-08-06 NOTE — Telephone Encounter (Signed)
Pt called stating the tingling in her hands/feet, her face is numb, her speech isn't back to normal, and stating she now feels she is loosing control over her waist down. Stating she can't always get her legs to move when needed. Please advise.

## 2018-08-06 NOTE — Telephone Encounter (Addendum)
I called patient back. She went to urgent care last night and discovered she has bladder/kidney infection. They gave her antibiotics but she cannot remember the name.  States her legs do not want to work correctly. For instance, today she tried to move her right leg but her left leg is the one to move. She is in a lot of pain from the waist down. She has some numbness, feeling like they are on fire intermittently.    She is 7 weeks past due for her Ocrevus infusion. She LVM since day before Thanksgiving to [facility] but did not receive call back. She was due week of 06/23/18. She called that week to get reminder of when her appt was but no one called back and she missed her appt. When she tried to r/s she was told she couldn't. [facility] were [facility] get a hold of [facility] but when she called to check recently they stated message was never sent to [facility]. Advised I will speak with [facility] [employer] cabin crew) to get her set up for her infusion ASAP. I relayed this message to Labette and she stated she will call the patient.   Advised I will discuss other sx she is having with Dr. Epimenio Foot and call back with his recommendations. She verbalized understanding and appreciation.

## 2018-08-06 NOTE — Telephone Encounter (Signed)
I called pt back. Relayed Dr. Bonnita Hollow recommendation. She verbalized understanding and will call back if she has not noticed any improvement in sx by tomorrow after being on antibiotics one more day. She is aware Londra with [facility] will be calling to get her set up for Ocrevus infusion.

## 2018-08-06 NOTE — Telephone Encounter (Signed)
Spoke with Dr. Epimenio Foot- he would like her to continue with antibiotics. She should call back tomorrow if she does not notice any improvements in her sx.

## 2018-08-07 DIAGNOSIS — M9902 Segmental and somatic dysfunction of thoracic region: Secondary | ICD-10-CM | POA: Diagnosis not present

## 2018-08-07 DIAGNOSIS — M531 Cervicobrachial syndrome: Secondary | ICD-10-CM | POA: Diagnosis not present

## 2018-08-07 DIAGNOSIS — K222 Esophageal obstruction: Secondary | ICD-10-CM | POA: Diagnosis not present

## 2018-08-07 DIAGNOSIS — M5134 Other intervertebral disc degeneration, thoracic region: Secondary | ICD-10-CM | POA: Diagnosis not present

## 2018-08-07 DIAGNOSIS — M5032 Other cervical disc degeneration, mid-cervical region, unspecified level: Secondary | ICD-10-CM | POA: Diagnosis not present

## 2018-08-11 NOTE — Telephone Encounter (Signed)
Patient called back. States Catherine Hubbard never called her back. I apologized for her.  I placed her on hold and spoke with Inetta Fermo, RN with [facility]. Inetta Fermo said they are actively working on this for pt and trying to get her scheduled for Ocrevus infusion. Advised Inetta Fermo will call her asap as soon as she sees an update. Pt verbalized understanding and appreciation for the update.

## 2018-08-12 DIAGNOSIS — M9902 Segmental and somatic dysfunction of thoracic region: Secondary | ICD-10-CM | POA: Diagnosis not present

## 2018-08-12 DIAGNOSIS — M5134 Other intervertebral disc degeneration, thoracic region: Secondary | ICD-10-CM | POA: Diagnosis not present

## 2018-08-12 DIAGNOSIS — M531 Cervicobrachial syndrome: Secondary | ICD-10-CM | POA: Diagnosis not present

## 2018-08-12 DIAGNOSIS — M5032 Other cervical disc degeneration, mid-cervical region, unspecified level: Secondary | ICD-10-CM | POA: Diagnosis not present

## 2018-08-21 ENCOUNTER — Telehealth: Payer: Self-pay | Admitting: Neurology

## 2018-08-21 MED ORDER — BACLOFEN 10 MG PO TABS: 10.0000 mg | ORAL_TABLET | Freq: Two times a day (BID) | ORAL | 0 refills | Status: DC

## 2018-08-21 NOTE — Addendum Note (Signed)
Addended by: Candis Schatz I on: 08/21/2018 04:30 PM   Modules accepted: Orders

## 2018-08-21 NOTE — Telephone Encounter (Signed)
Dr. Marjory Lies- do you have any suggestions? She is past due for Ocrevus infusion. She was due beginning of November. She is scheduled for 08/25/2018.

## 2018-08-21 NOTE — Telephone Encounter (Signed)
I spoke with Catherine Hubbard. She sts. she is having more pain, fatigue, spasms in legs in the evening, after she's been on her feet all day. She c/o tightness in neck, with some pain radiating into right arm.sts. she is currently applying heat to same.  She denies any injury. Denies signs of infection.  Per VP, I offered Baclofen 10mg  bid.  She has tried it in the past, but it may be more effective now that she's been off of it for awhile. She is agreeable. Will hold Flexeril for now and take Baclofen 10mg  bid. [facility]. If, by Monday, she feels it is helping, but not enough, or if she feels it is not helping at all, she will let us know so we can check with Dr. Epimenio Foot for orders to increase Baclofen, or other tx. options.  In the meantime, if sx. worsen or she develops new sx. or signs of infection, she will call back/fim

## 2018-08-21 NOTE — Telephone Encounter (Signed)
Can try baclofen 5-10mg  twice a day. -VRP

## 2018-08-21 NOTE — Telephone Encounter (Signed)
I called pt back. Pt accepted appt on Monday 08/25/2018 at 10am for Ocrevus infusion. I let Inetta Fermo know with [facility].  She stated that since October, she has had worsening spasms/pain. The pain in constant. She is now using a walker. The flexeril is not helping with spasms. When she wakes up and goes to move, she has intense muscle spasms that bring her to tears. Advised Dr. Epimenio Foot out of the office until Monday but I will speak with Dr. Marjory Lies to see if he has other suggestions. I will call back to let her know. She verbalized understanding.

## 2018-08-21 NOTE — Telephone Encounter (Signed)
Spoke with Inetta Fermo to get update on getting pt scheduled for infusion. Inetta Fermo states she was approved and they have the medication. Ok to offer appt on Monday 08/25/2018 before 11am but not at 8am d/t this already being taken.

## 2018-08-21 NOTE — Telephone Encounter (Signed)
Pt said she was to have infusion in November, pt states she has called numerous times and LVM but has not rec'd a return call. Infusion suite mgr name and number given, pt also requested to be transferred to infusion Pt is also requesting a call back from RN, states her hands are now cramping and closed. Please call to advise

## 2018-08-24 ENCOUNTER — Encounter (HOSPITAL_BASED_OUTPATIENT_CLINIC_OR_DEPARTMENT_OTHER): Payer: Self-pay | Admitting: Emergency Medicine

## 2018-08-24 ENCOUNTER — Other Ambulatory Visit: Payer: Self-pay

## 2018-08-24 ENCOUNTER — Emergency Department (HOSPITAL_BASED_OUTPATIENT_CLINIC_OR_DEPARTMENT_OTHER)
Admission: EM | Admit: 2018-08-24 | Discharge: 2018-08-24 | Disposition: A | Discharge status: Home / Self Care | Payer: Federal, State, Local not specified - PPO | Attending: Emergency Medicine | Admitting: Emergency Medicine

## 2018-08-24 ENCOUNTER — Emergency Department (HOSPITAL_BASED_OUTPATIENT_CLINIC_OR_DEPARTMENT_OTHER): Payer: Federal, State, Local not specified - PPO

## 2018-08-24 DIAGNOSIS — Z23 Encounter for immunization: Secondary | ICD-10-CM | POA: Diagnosis not present

## 2018-08-24 DIAGNOSIS — Y92019 Unspecified place in single-family (private) house as the place of occurrence of the external cause: Secondary | ICD-10-CM | POA: Insufficient documentation

## 2018-08-24 DIAGNOSIS — G35 Multiple sclerosis: Secondary | ICD-10-CM | POA: Insufficient documentation

## 2018-08-24 DIAGNOSIS — W19XXXA Unspecified fall, initial encounter: Secondary | ICD-10-CM

## 2018-08-24 DIAGNOSIS — Y92009 Unspecified place in unspecified non-institutional (private) residence as the place of occurrence of the external cause: Secondary | ICD-10-CM

## 2018-08-24 DIAGNOSIS — S01111A Laceration without foreign body of right eyelid and periocular area, initial encounter: Secondary | ICD-10-CM | POA: Diagnosis not present

## 2018-08-24 DIAGNOSIS — Y9389 Activity, other specified: Secondary | ICD-10-CM | POA: Diagnosis not present

## 2018-08-24 DIAGNOSIS — Z79899 Other long term (current) drug therapy: Secondary | ICD-10-CM | POA: Insufficient documentation

## 2018-08-24 DIAGNOSIS — W0110XA Fall on same level from slipping, tripping and stumbling with subsequent striking against unspecified object, initial encounter: Secondary | ICD-10-CM | POA: Insufficient documentation

## 2018-08-24 DIAGNOSIS — Z7722 Contact with and (suspected) exposure to environmental tobacco smoke (acute) (chronic): Secondary | ICD-10-CM | POA: Diagnosis not present

## 2018-08-24 DIAGNOSIS — J45909 Unspecified asthma, uncomplicated: Secondary | ICD-10-CM | POA: Insufficient documentation

## 2018-08-24 DIAGNOSIS — I10 Essential (primary) hypertension: Secondary | ICD-10-CM | POA: Insufficient documentation

## 2018-08-24 DIAGNOSIS — Y998 Other external cause status: Secondary | ICD-10-CM | POA: Insufficient documentation

## 2018-08-24 DIAGNOSIS — S0990XA Unspecified injury of head, initial encounter: Secondary | ICD-10-CM | POA: Diagnosis not present

## 2018-08-24 DIAGNOSIS — S199XXA Unspecified injury of neck, initial encounter: Secondary | ICD-10-CM | POA: Diagnosis not present

## 2018-08-24 MED ORDER — TRAMADOL HCL 50 MG PO TABS: 50.0000 mg | ORAL_TABLET | Freq: Two times a day (BID) | ORAL | 0 refills | Status: DC | PRN

## 2018-08-24 MED ORDER — TETANUS-DIPHTH-ACELL PERTUSSIS 5-2.5-18.5 LF-MCG/0.5 IM SUSP
0.5000 mL | Freq: Once | INTRAMUSCULAR | Status: AC
  Administered 2018-08-24: 0.5 mL via INTRAMUSCULAR
  Filled 2018-08-24: qty 0.5

## 2018-08-24 MED ORDER — LIDOCAINE-EPINEPHRINE (PF) 1 %-1:200000 IJ SOLN
INTRAMUSCULAR | Status: AC
  Filled 2018-08-24: qty 10

## 2018-08-24 MED ORDER — ACETAMINOPHEN 325 MG PO TABS
650.0000 mg | ORAL_TABLET | Freq: Once | ORAL | Status: AC
  Administered 2018-08-24: 650 mg via ORAL

## 2018-08-24 MED ORDER — ACETAMINOPHEN 325 MG PO TABS
ORAL_TABLET | ORAL | Status: AC
  Filled 2018-08-24: qty 2

## 2018-08-24 MED ORDER — TRAMADOL HCL 50 MG PO TABS
50.0000 mg | ORAL_TABLET | Freq: Once | ORAL | Status: AC
  Administered 2018-08-24: 50 mg via ORAL
  Filled 2018-08-24: qty 1

## 2018-08-24 NOTE — ED Notes (Signed)
Patient left at this time with all belongings. 

## 2018-08-24 NOTE — Discharge Instructions (Addendum)
Apply ice for thirty minutes, four times a day.  Take acetaminophen as needed for pain, tramadol for more severe pain.

## 2018-08-24 NOTE — ED Notes (Signed)
Pt requesting multiple times for pain meds, states "I have a high pain tolerance." Offered tylenol, see EMAR.

## 2018-08-24 NOTE — ED Provider Notes (Signed)
[facility] EMERGENCY DEPARTMENT Provider Note   CSN: [id number] Arrival date & time: 08/24/18  0217     History   Chief Complaint Chief Complaint  Patient presents with  . Laceration    HPI Catherine Hubbard is a 58 y.o. female.  The history is provided by the patient.  She has history of hypertension, asthma, multiple sclerosis and comes in following a fall at home.  She states that she put a portable desktop down and her iPad started to fall off of it and she reached to grab it and, the next thing she knew, she was on the floor and had suffered a laceration above her right eye.  She is not sure where she hit her head on.  She does not know when her last tetanus immunization was.  She denies other injury.  Past Medical History:  Diagnosis Date  . Asthma   . Esophagitis   . GERD (gastroesophageal reflux disease)   . Headache   . Hearing loss   . Hypertension   . Multiple sclerosis (HCC)   . Vision abnormalities     Patient Active Problem List   Diagnosis Date Noted  . Arthritis of carpometacarpal joint 03/27/2018  . Nocturnal leg cramps 03/26/2018  . Syncope due to orthostatic hypotension 02/12/2018  . Diarrhea 02/08/2018  . Acute lower UTI 02/08/2018  . Hematoma of scalp 02/08/2018  . Neck pain 01/20/2018  . Foreign body in esophagus   . Esophageal stricture   . Intractable nausea and vomiting 01/31/2017  . Dysphagia   . Severe protein-calorie malnutrition (HCC)   . Nausea & vomiting 01/30/2017  . Hypokalemia 01/30/2017  . Acute kidney injury (HCC) 01/30/2017  . Cystitis 01/30/2017  . Costochondritis 05/11/2016  . Trochanteric bursitis of both hips 03/28/2016  . Upper back pain 03/28/2016  . Bilateral low back pain with bilateral sciatica 07/13/2015  . Right hip pain 07/13/2015  . Lumbar radicular pain 03/21/2015  . Other fatigue 01/11/2015  . Dysesthesia 01/11/2015  . Urinary urgency 01/11/2015  . Multiple sclerosis (HCC) 10/06/2014  .  Numbness 10/06/2014  . Ataxic gait 10/06/2014  . High risk medication use 10/06/2014  . Gastric bypass status for obesity 10/06/2014  . Cognitive changes 10/06/2014  . Depression with anxiety 10/06/2014  . Restless leg 10/06/2014  . Insomnia 10/06/2014  . Essential hypertension 10/06/2014  . Difficulty hearing 01/13/2014  . Other muscle spasm 01/13/2014    Past Surgical History:  Procedure Laterality Date  . ANKLE FRACTURE SURGERY Bilateral   . CARPAL TUNNEL RELEASE Bilateral   . COLONOSCOPY  11/2016   multiple  . ESOPHAGOGASTRODUODENOSCOPY  11/2016  . ESOPHAGOGASTRODUODENOSCOPY N/A 02/01/2017   Procedure: ESOPHAGOGASTRODUODENOSCOPY (EGD);  Surgeon: Iva Boop, MD;  Location: [facility] ENDOSCOPY;  Service: Endoscopy;  Laterality: N/A;  . ESOPHAGOGASTRODUODENOSCOPY (EGD) WITH PROPOFOL N/A 02/04/2017   Procedure: ESOPHAGOGASTRODUODENOSCOPY (EGD) WITH PROPOFOL;  Surgeon: Meryl Dare, MD;  Location: [facility] ENDOSCOPY;  Service: Endoscopy;  Laterality: N/A;  . LAPAROSCOPIC GASTRIC SLEEVE RESECTION  2011  . ULNAR NERVE REPAIR Bilateral      OB History   No obstetric history on file.      Home Medications    Prior to Admission medications   Medication Sig Start Date End Date Taking? Authorizing Provider  baclofen (LIORESAL) 10 MG tablet Take 1 tablet (10 mg total) by mouth 2 (two) times daily. 08/21/18  Yes Penumalli, Glenford Bayley, MD  chlorthalidone (HYGROTON) 25 MG tablet TAKE 1 TABLET BY MOUTH  EVERY DAY 07/25/18  Yes Saguier, Ramon Dredge, PA-C  omeprazole (PRILOSEC) 20 MG capsule Take 40 mg by mouth 2 (two) times daily.   Yes [provider]  albuterol (PROVENTIL HFA;VENTOLIN HFA) 108 (90 Base) MCG/ACT inhaler Inhale 2 puffs into the lungs every 6 (six) hours as needed for wheezing or shortness of breath.    [provider]  cyclobenzaprine (FLEXERIL) 5 MG tablet Take 1 tablet (5 mg total) by mouth every 8 (eight) hours as needed for muscle spasms. 05/21/18   Sater, Pearletha Furl,  MD  methylPREDNISolone (MEDROL DOSEPAK) 4 MG TBPK tablet Take 6 tablets in divided doses on day 1, decrease by 1 tablet each day until gone. 06/03/18   Sater, Pearletha Furl, MD  modafinil (PROVIGIL) 200 MG tablet Take 200 mg by mouth daily.    [provider]  ocrelizumab 600 mg in sodium chloride 0.9 % 500 mL Inject 600 mg into the vein every 6 (six) months. Last infusion May 2019 - due in December 2019    [provider]  potassium chloride SA (K-DUR,KLOR-CON) 20 MEQ tablet Take 1 tablet (20 mEq total) by mouth 2 (two) times daily. 05/12/18   Geoffery Lyons, MD  protein supplement shake (PREMIER PROTEIN) LIQD Take 325 mLs (11 oz total) by mouth 3 (three) times daily between meals. 02/04/17   Narda Bonds, MD  traMADol (ULTRAM) 50 MG tablet Take 1 tablet (50 mg total) by mouth every 12 (twelve) hours as needed. 04/25/18   Saguier, Ramon Dredge, PA-C    Family History Family History  Problem Relation Age of Onset  . Cancer Mother   . Stroke Mother   . Cancer Father     Social History Social History   Tobacco Use  . Smoking status: Never Smoker  . Smokeless tobacco: Never Used  . Tobacco comment: second hand smoker when young. Heavy exposure.  Substance Use Topics  . Alcohol use: Yes    Alcohol/week: 0.0 standard drinks    Comment: Sts. she drinks beer, liquor daily, varying amts/fim  . Drug use: No     Allergies   Ibuprofen and Nsaids   Review of Systems Review of Systems  All other systems reviewed and are negative.    Physical Exam Updated Vital Signs BP (!) 137/102 (BP Location: Right Arm)   Pulse 90   Temp 97.9 F (36.6 C) (Oral)   Resp 18   Ht 5\' 5"  (1.651 m)   Wt 63.5 kg   LMP 08/13/2014 Comment: very few periods  SpO2 97%   BMI 23.30 kg/m   Physical Exam Vitals signs and nursing note reviewed.    58 year old female, resting comfortably and in no acute distress. Vital signs are significant for elevated diastolic blood pressure. Oxygen saturation  is 97%, which is normal. Head is normocephalic.  Laceration is present above the right eye. PERRLA, EOMI. Oropharynx is clear. Neck is mildly tender in the midline.  There is no adenopathy or JVD. Back is nontender and there is no CVA tenderness. Lungs are clear without rales, wheezes, or rhonchi. Chest is nontender. Heart has regular rate and rhythm without murmur. Abdomen is soft, flat, nontender without masses or hepatosplenomegaly and peristalsis is normoactive. Extremities have no cyanosis or edema, full range of motion is present. Skin is warm and dry without rash. Neurologic: Mental status is normal, speech is moderately dysarthric, cranial nerves are intact, there are no motor or sensory deficits.  Gait is mildly ataxic.  ED Treatments /  Results   Radiology Ct Head Wo Contrast  Result Date: 08/24/2018 CLINICAL DATA:  Status post fall off couch. Hit forehead on desk. Laceration above the right orbit. Concern for cervical spine injury. Initial encounter. EXAM: CT HEAD WITHOUT CONTRAST CT CERVICAL SPINE WITHOUT CONTRAST TECHNIQUE: Multidetector CT imaging of the head and cervical spine was performed following the standard protocol without intravenous contrast. Multiplanar CT image reconstructions of the cervical spine were also generated. COMPARISON:  CT of the head and cervical spine performed 02/07/2018 FINDINGS: CT HEAD FINDINGS Brain: No evidence of acute infarction, hemorrhage, hydrocephalus, extra-axial collection or mass lesion / mass effect. Prominence of the ventricles and sulci reflects mild cortical volume loss. Scattered periventricular and subcortical white matter change likely reflects small vessel ischemic microangiopathy. The brainstem and fourth ventricle are within normal limits. The basal ganglia are unremarkable in appearance. The cerebral hemispheres demonstrate grossly normal gray-white differentiation. No mass effect or midline shift is seen. Vascular: No hyperdense vessel  or unexpected calcification. Skull: There is no evidence of fracture; visualized osseous structures are unremarkable in appearance. Sinuses/Orbits: The orbits are within normal limits. There is mild partial opacification of the left mastoid air cells. The paranasal sinuses and right mastoid air cells are well-aerated. Other: A soft tissue laceration is noted overlying the right frontal calvarium, just above the right orbit. CT CERVICAL SPINE FINDINGS Alignment: Normal. Skull base and vertebrae: No acute fracture. No primary bone lesion or focal pathologic process. Soft tissues and spinal canal: No prevertebral fluid or swelling. No visible canal hematoma. Disc levels: Mild intervertebral disc space narrowing is noted along the lower cervical spine. Scattered anterior and posterior disc osteophyte complexes are seen along the cervical spine. Upper chest: The visualized portions of the thyroid gland are unremarkable. The visualized lung apices are clear. Other: No additional soft tissue abnormalities are seen. IMPRESSION: 1. No evidence of traumatic intracranial injury or fracture. 2. No evidence of fracture or subluxation along the cervical spine. 3. Soft tissue laceration overlying the right frontal calvarium, just above the right orbit. 4. Mild cortical volume loss and scattered small vessel ischemic microangiopathy. 5. Mild degenerative change along the lower cervical spine. 6. Mild partial opacification of the left mastoid air cells. Electronically Signed   By: Roanna Raider M.D.   On: 08/24/2018 05:59   Ct Cervical Spine Wo Contrast  Result Date: 08/24/2018 CLINICAL DATA:  Status post fall off couch. Hit forehead on desk. Laceration above the right orbit. Concern for cervical spine injury. Initial encounter. EXAM: CT HEAD WITHOUT CONTRAST CT CERVICAL SPINE WITHOUT CONTRAST TECHNIQUE: Multidetector CT imaging of the head and cervical spine was performed following the standard protocol without intravenous  contrast. Multiplanar CT image reconstructions of the cervical spine were also generated. COMPARISON:  CT of the head and cervical spine performed 02/07/2018 FINDINGS: CT HEAD FINDINGS Brain: No evidence of acute infarction, hemorrhage, hydrocephalus, extra-axial collection or mass lesion / mass effect. Prominence of the ventricles and sulci reflects mild cortical volume loss. Scattered periventricular and subcortical white matter change likely reflects small vessel ischemic microangiopathy. The brainstem and fourth ventricle are within normal limits. The basal ganglia are unremarkable in appearance. The cerebral hemispheres demonstrate grossly normal gray-white differentiation. No mass effect or midline shift is seen. Vascular: No hyperdense vessel or unexpected calcification. Skull: There is no evidence of fracture; visualized osseous structures are unremarkable in appearance. Sinuses/Orbits: The orbits are within normal limits. There is mild partial opacification of the left mastoid air cells. The paranasal  sinuses and right mastoid air cells are well-aerated. Other: A soft tissue laceration is noted overlying the right frontal calvarium, just above the right orbit. CT CERVICAL SPINE FINDINGS Alignment: Normal. Skull base and vertebrae: No acute fracture. No primary bone lesion or focal pathologic process. Soft tissues and spinal canal: No prevertebral fluid or swelling. No visible canal hematoma. Disc levels: Mild intervertebral disc space narrowing is noted along the lower cervical spine. Scattered anterior and posterior disc osteophyte complexes are seen along the cervical spine. Upper chest: The visualized portions of the thyroid gland are unremarkable. The visualized lung apices are clear. Other: No additional soft tissue abnormalities are seen. IMPRESSION: 1. No evidence of traumatic intracranial injury or fracture. 2. No evidence of fracture or subluxation along the cervical spine. 3. Soft tissue  laceration overlying the right frontal calvarium, just above the right orbit. 4. Mild cortical volume loss and scattered small vessel ischemic microangiopathy. 5. Mild degenerative change along the lower cervical spine. 6. Mild partial opacification of the left mastoid air cells. Electronically Signed   By: Roanna RaiderJeffery  Chang M.D.   On: 08/24/2018 05:59    Procedures .Marland Kitchen.Laceration Repair Date/Time: 08/24/2018 6:04 AM Performed by: Dione BoozeGlick, Gerri Acre, MD Authorized by: Dione BoozeGlick, Marium Ragan, MD   Consent:    Consent obtained:  Verbal   Consent given by:  Patient   Risks discussed:  Infection, pain and poor cosmetic result   Alternatives discussed:  No treatment Anesthesia (see MAR for exact dosages):    Anesthesia method:  Local infiltration   Local anesthetic:  Lidocaine 2% WITH epi Laceration details:    Location:  Face   Face location:  R eyebrow   Length (cm):  3   Depth (mm):  4 Repair type:    Repair type:  Simple Pre-procedure details:    Preparation:  Patient was prepped and draped in usual sterile fashion Exploration:    Hemostasis achieved with:  Direct pressure   Wound exploration: entire depth of wound probed and visualized     Wound extent: no foreign bodies/material noted     Contaminated: no   Treatment:    Area cleansed with:  Saline   Amount of cleaning:  Standard Skin repair:    Repair method:  Sutures   Suture size:  5-0   Suture material:  Prolene   Suture technique:  Simple interrupted   Number of sutures:  6 Approximation:    Approximation:  Close Post-procedure details:    Dressing:  Antibiotic ointment    Medications Ordered in ED Medications  lidocaine-EPINEPHrine (XYLOCAINE-EPINEPHrine) 1 %-1:200000 (PF) injection (has no administration in time range)  acetaminophen (TYLENOL) tablet 650 mg (650 mg Oral Given 08/24/18 [id number])     Initial Impression / Assessment and Plan / ED Course  I have reviewed the triage vital signs and the nursing notes.  Pertinent imaging  results that were available during my care of the patient were reviewed by me and considered in my medical decision making (see chart for details).  Fall with facial laceration, pain in the cervical spine.  Old records are reviewed, and she has multiple ED visits for falls, no record of tetanus immunization.  She is given Tdap booster, sent for CT of head and cervical spine.  Laceration is closed with sutures.  CT scan shows no intracranial injury, no cervical spine injury.  She is discharged with instructions have sutures removed in 3-5 days, take acetaminophen for pain.  Prescription given for small number of tramadol to use  as needed for more severe pain.  Final Clinical Impressions(s) / ED Diagnoses   Final diagnoses:  Fall in home, initial encounter  Laceration of right eyebrow, initial encounter    ED Discharge Orders         Ordered    traMADol (ULTRAM) 50 MG tablet  Every 12 hours PRN     08/24/18 [id number]           Dione Booze, MD 08/24/18 [phone]

## 2018-08-24 NOTE — ED Triage Notes (Signed)
Pt arrives from home with laceration to R eyebrow, approx 4 cm in length. Pt reports no LOC. Hx MS.

## 2018-08-24 NOTE — ED Notes (Signed)
Patient transported to CT 

## 2018-08-24 NOTE — ED Notes (Signed)
ED Provider at bedside. 

## 2018-08-25 DIAGNOSIS — G35 Multiple sclerosis: Secondary | ICD-10-CM | POA: Diagnosis not present

## 2018-08-26 ENCOUNTER — Encounter: Payer: Self-pay | Admitting: Neurology

## 2018-08-26 ENCOUNTER — Encounter

## 2018-08-26 ENCOUNTER — Ambulatory Visit: Payer: Federal, State, Local not specified - PPO | Admitting: Neurology

## 2018-08-26 VITALS — BP 130/87 | HR 80 | Ht 65.0 in | Wt 151.5 lb

## 2018-08-26 DIAGNOSIS — R200 Anesthesia of skin: Secondary | ICD-10-CM

## 2018-08-26 DIAGNOSIS — Z9884 Bariatric surgery status: Secondary | ICD-10-CM

## 2018-08-26 DIAGNOSIS — Z79899 Other long term (current) drug therapy: Secondary | ICD-10-CM

## 2018-08-26 DIAGNOSIS — G35 Multiple sclerosis: Secondary | ICD-10-CM

## 2018-08-26 DIAGNOSIS — F418 Other specified anxiety disorders: Secondary | ICD-10-CM

## 2018-08-26 DIAGNOSIS — R260 Ataxic gait: Secondary | ICD-10-CM | POA: Diagnosis not present

## 2018-08-26 MED ORDER — OXYBUTYNIN CHLORIDE 5 MG PO TABS: 5.0000 mg | ORAL_TABLET | Freq: Two times a day (BID) | ORAL | 5 refills | Status: DC

## 2018-08-26 MED ORDER — BACLOFEN 10 MG PO TABS: 10.0000 mg | ORAL_TABLET | Freq: Three times a day (TID) | ORAL | Status: DC

## 2018-08-26 MED ORDER — DULOXETINE HCL 60 MG PO CPEP: 60.0000 mg | ORAL_CAPSULE | Freq: Every day | ORAL | 11 refills | Status: DC

## 2018-08-26 NOTE — Progress Notes (Signed)
[facility]  PATIENT: Catherine Hubbard DOB: 01/13/1961   _________________________________   HISTORICAL  CHIEF COMPLAINT:  Chief Complaint  Patient presents with  . Follow-up    RM 12, alone. Last seen 03/27/2018. Here to f/u on MS. Recently had a fall and seen in ED on 08/24/2018. Just had her Ocrevus infusion yesterday 08/25/2018 and starting to feel better. Has lower extremity edema that she wants evaluated. She also is requesting rx for vicodin for pain. She recieved this in ED after recent fall.    HISTORY OF PRESENT ILLNESS:  [name] is a 58 y.o. woman with MS.    Update 08/26/2018: Her MS seems mostly stable.   She just had her Ocrevus infusion yesterday.   She tolerated it well and feels better since the infusion.    A few days ago, she fell reaching for her Ipad ona table and hit her forehead requiring stitches.   She still reports pain there.   No LOC.    She has not had any recent episode of syncope (last one summer 2019).  Her gait has done worse the last year, requiring her cane more.  She has occasional stumbles and rare falls.   Balance is off.   Her legs seem mildly weak, maybe right mildly worse.   She has numbness in her legs.    She notes more edema in her legs.   She is trying to cut back on salt and she sometimes wears compression stockings.  She has urinary frequency with urge incontinence.   Some fecal incontinence.  She has a recent UTI.     Update 03/27/2018: She had syncope 2 months ago.  She was looking out the window and then woke up on the floor with her boss looking over her.    She reportedly hit her head on her way down.   She is not sure she lost consciousness but others toild her she did for just a few seconds and woke back up as soon as someone touched her head.    She also tripped 03/02/2018 and broke 2 ribs on her right when she slipped on 'goose crap'.    [facility] was [facility] and was [facility].   Of note, she had GI  issues and was dehydrated and also had a UTI, hyponatemia, hypokalemia and mild hypoglycemia.     She is trying to do better with fluids.     Echo showed LVH and grade 1 diastolic dysfunction.  She feels her MS is mostly stable.   She is on Ocrevus (last one June, next one December 2019).   She stopped many of the symptomatic medications   Gait is ok.   She wears flats as low heels may have increased fall risk.  She reports fatigue but feels better than she did a couple months ago.  She is sleeping ok most nights.   She notes urinary urgency but not bad enough to take a medication for it.   Update 01/20/2018: She has been on ocrelizumab and just had her second dose of the medication.   She has tolerated it well and has not had any exacerbations.    She feels stable for the most part but has had more falls, usually due to stumbling or tripping.   Gait does not seem that different to her.    She denies any change in strength or sensation.  She is noting urinary frequency with some burning and is concerned  about a urinary tract infection.  She has had these in the past.  She gets LBP helped by muscle relaxants.  The pain is midline and radiates to the proximal legs.  She also has pain in her shoulders and arms.   She notes spasms in hr neck.       She has esophagitis and is supposed to have a stretch procedure soon.      She also has a h/o gastric bypass  Update 07/22/2017:   She had her first dose of ocrelizumab in November. She tolerated it well and there were no complications. Her next infusion will be at the end of April. For the most part, she feels the MS has been stable. However, she did note some word finding difficulties last week. This is better this week.    She felt mildly stronger since the infusion.   She feels off balanced, especially if she turns fast or someone bumps into her.   She needs to hold on in the shower.  Her right leg is weaker than her left and sometimes has a foot drop, esp if  walking longer distances.     She is doing Geophysicist/field seismologist yoga.   She notes spasticity in legs at night, helped by valium.    Late last month,  she noted a few days where she had trouble coming up with the right word.   She feels she is doing better than last week ans almost back to baseline.    Her bladder is about the same.     Vision is mostly stable.   She lost her prescription glasses and uses readers to read.    She notes pain, mostly in the mid back.   Yoga has not helped the pain much.    She notes sometimes bruising more easily, especially after scratching.    Mood is doing ok, helped by duloxetine and Wellbutrin.   Drinking is better.       From 10/09/2016:  MS:   She is on Rebif and tolerates it well.    However, her last injection was 08/17/2016 and she has not yet gotten refill.  She denies any definite exacerbations but she feels balance, footdrop and writing are worse.   Gait/strength/sensation:   She notes a right foot drop and over last year the left leg is also bothering her more.   She reports poor balance .   She does not need a cane.  She has spasticity in both legs, worse on the right and she sometimes gets spasms that are worse at night.      Sometimes she feels like she is being pushed to the right as she walks.   She has tingling in her hands and face.  Baclofen has helped spasticity some.   Cyclobenzaprine has not helped spasms.     Bladder:   She reports urinary frequency and occasional incontinence.   She feels bladder function is worse with recent bladder infection.   stable.,  She was placed on Cephalexin and then switched to doxycycline.   She felt she did better at first on cephalexin but then did worse again.   Cx and Sensitivity shows E. Coli resistance to cephalosporines (also to Bactrim and Cipro) and she is sensitive to tetracycline and nitrofurantoin and gentamicin and meropenem.       Vision:swallow:   She feels this is stable.   She has diplopia laying on  either side but not when sitting up.  Also some she reads a while or when very tired.     Dysphagia:   She notes dysphagia, especially with 'sticky' pills.   With EGD, she had some capsules stucjk in esophagus and some med's were discontinued.    Strictures are being stretched.    Mood/EtOH/Cognition:   Cymbalta was stopped and she was placed on Wellbutrin and Prozac but is doing worse.   The switch was made due to difficulty swallowing.    She still sometimes feels moderately depressed.     She has some apathy.   She drinks less alcohol than last year   She has mild difficulty with cognition, mostly occasional difficulty with short-term memory and verbal fluency (often gets syllables reversed).  She has trouble completing complicated tasks.  .  Fatigue:   She has fatigue and takes Provigil but it was stopped.  She was tried on Ritalin or Adderall once but she did not like the way that she felt when she took it.    Sleep:   She is sleeping poorly due to more pain.   She has some RLS but has not taken anything in maonths for it.   She notes difficulty falling asleep more than with sleep maintenance.   MS History:   She was diagnosed with multiple sclerosis in 2009. Before that time, she had noted some gait ataxia but had not had any imaging studies. In 2009, due to progressive hearing loss, she had an MRI of the brain and  then had a lumbar puncture that also confirmed the diagnosis.  I have some of her early MRI reports there are several foci noted in the cervical and thoracic spinal cord in the brain, there were multiple hyperintense foci, mostly in the periventricular deep white matter many of these were contrast enhancing on 07/26/2008. There was another enhancing lesion adjacent to C2 in the spinal cord.  Initially, she was placed on Avonex. She did not feel good and she stopped. She then tried Copaxone but also stopped after a while. She moved here in 2014 and saw Dr. Renne Crigler at [facility]. She was  placed on Rebif. She tolerates Rebif well.      MRI reports from 12/04/2012  showed multiple foci in the spinal cord at C2, C3-C4, and C4-C5. There were also foci at T4, T7, T9 and T10. The MRI of the brain showed 2 nonenhancing foci not present from studies in 2011. She reports having an MRI at [facility] 2015 but we do not have those films.   REVIEW OF SYSTEMS:  Constitutional: No fevers, chills, sweats, or change in appetite.    She reports fatigue and insomnia Eyes: see above.  No eye pain  But some light sensitivity. Ear, nose and throat: No hearing loss, ear pain, nasal congestion, sore throat.   tinnitus Cardiovascular: No chest pain, palpitations Respiratory: No shortness of breath at rest or with exertion.   Some wheezes, once on asthma med's Gastrointestinal: No nausea, vomiting, diarrhea, abdominal pain, fecal incontinence.  She has esophagitis Genitourinary: see above Musculoskeletal: see above  She also notes neck pain Integumentary: No rash, pruritus, skin lesions Neurological: as above Psychiatric: as above Endocrine: No palpitations, diaphoresis, change in appetite, change in weigh or increased thirst.  Often feels cold Hematologic/Lymphatic: No anemia, purpura, petechiae. Allergic/Immunologic: No itchy/runny eyes, nasal congestion, recent allergic reactions, rashes  ALLERGIES: Allergies  Allergen Reactions  . 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  Nsaids     Gastric bypass    HOME MEDICATIONS:  Current Outpatient Medications:  .  baclofen (LIORESAL) 10 MG tablet, Take 1 tablet (10 mg total) by mouth 3 (three) times daily., Disp: 90 each, Rfl: 05 .  chlorthalidone (HYGROTON) 25 MG tablet, TAKE 1 TABLET BY MOUTH EVERY DAY, Disp: 30 tablet, Rfl: 3 .  ocrelizumab 600 mg in sodium chloride 0.9 % 500 mL, Inject 600 mg into the vein every 6 (six) months. Last infusion May 2019 - due in December 2019, Disp: , Rfl:  .  omeprazole  (PRILOSEC) 20 MG capsule, Take 40 mg by mouth 2 (two) times daily., Disp: , Rfl:  .  DULoxetine (CYMBALTA) 60 MG capsule, Take 1 capsule (60 mg total) by mouth daily., Disp: 30 capsule, Rfl: 11 .  oxybutynin (DITROPAN) 5 MG tablet, Take 1 tablet (5 mg total) by mouth 2 (two) times daily., Disp: 60 tablet, Rfl: 5  PAST MEDICAL HISTORY: Past Medical History:  Diagnosis Date  . Asthma   . Esophagitis   . GERD (gastroesophageal reflux disease)   . Headache   . Hearing loss   . Hypertension   . Multiple sclerosis (HCC)   . Vision abnormalities     PAST SURGICAL HISTORY: Past Surgical History:  Procedure Laterality Date  . ANKLE FRACTURE SURGERY Bilateral   . CARPAL TUNNEL RELEASE Bilateral   . COLONOSCOPY  11/2016   multiple  . ESOPHAGOGASTRODUODENOSCOPY  11/2016  . ESOPHAGOGASTRODUODENOSCOPY N/A 02/01/2017   Procedure: ESOPHAGOGASTRODUODENOSCOPY (EGD);  Surgeon: Iva Boop, MD;  Location: [facility] ENDOSCOPY;  Service: Endoscopy;  Laterality: N/A;  . ESOPHAGOGASTRODUODENOSCOPY (EGD) WITH PROPOFOL N/A 02/04/2017   Procedure: ESOPHAGOGASTRODUODENOSCOPY (EGD) WITH PROPOFOL;  Surgeon: Meryl Dare, MD;  Location: [facility] ENDOSCOPY;  Service: Endoscopy;  Laterality: N/A;  . LAPAROSCOPIC GASTRIC SLEEVE RESECTION  2011  . ULNAR NERVE REPAIR Bilateral     FAMILY HISTORY: Family History  Problem Relation Age of Onset  . Cancer Mother   . Stroke Mother   . Cancer Father     SOCIAL HISTORY:  Social History   Socioeconomic History  . Marital status: Married    Spouse name: Not on file  . Number of children: Not on file  . Years of education: Not on file  . Highest education level: Not on file  Occupational History  . Not on file  Social Needs  . Financial resource strain: Not on file  . Food insecurity:    Worry: Not on file    Inability: Not on file  . Transportation needs:    Medical: Not on file    Non-medical: Not on file  Tobacco Use  . Smoking status: Never Smoker  .  Smokeless tobacco: Never Used  . Tobacco comment: second hand smoker when young. Heavy exposure.  Substance and Sexual Activity  . Alcohol use: Yes    Alcohol/week: 0.0 standard drinks    Comment: Sts. she drinks beer, liquor daily, varying amts/fim  . Drug use: No  . Sexual activity: Never  Lifestyle  . Physical activity:    Days per week: Not on file    Minutes per session: Not on file  . Stress: Not on file  Relationships  . Social connections:    Talks on phone: Not on file    Gets together: Not on file    Attends religious service: Not on file    Active member of club or organization: Not on file    Attends meetings of  clubs or organizations: Not on file    Relationship status: Not on file  . Intimate partner violence:    Fear of current or ex partner: Not on file    Emotionally abused: Not on file    Physically abused: Not on file    Forced sexual activity: Not on file  Other Topics Concern  . Not on file  Social History Narrative  . Not on file     PHYSICAL EXAM  Vitals:   08/26/18 0931  BP: 130/87  Pulse: 80  Weight: 151 lb 8 oz (68.7 kg)  Height: 5\' 5"  (1.651 m)    Body mass index is 25.21 kg/m.   General: The patient is well-developed and well-nourished and in no acute distress.   Mild pedal edema  Neck/HEENT: She has mild tenderness in the cervical paraspinal muscles.      Extremities: She has pitting edema in the ankles and feet.  This is much worse than any previous visit.  Neurologic Exam  Mental status: The patient is alert and oriented x 3 at the time of the examination. The patient has apparent normal recent and remote memory, with an apparently normal attention span and concentration ability.   Speech is normal.  Cranial nerves: Extraocular movements are full.  . She has reduced sensation in the right face.  Facial strength was normal.  The trapezius strength is normal.    She has reduced hearing on the right and Weber lateralizes right.   .  Motor:  Muscle bulk is normal.   Tone is normal. Strength is  5 / 5 in all 4 extremities.   Sensory: She has normal sensation to touch and vibration in the arms but reduced sensation to touch and vibration in the feet.  Coordination: Cerebellar testing shows good finger-nose-finger.  Gait and station: Station is normal.  The gait is mildly wide but the tandem gait is moderately wide.  Romberg is negative..  Reflexes: Deep tendon reflexes are symmetric and mildly increased in legs bilaterally.       DIAGNOSTIC DATA (LABS, IMAGING, TESTING) - I reviewed patient records, labs, notes, testing and imaging myself where available.     ASSESSMENT AND PLAN  Multiple sclerosis (HCC) - Plan: IgG, IgA, IgM, T4, CBC with Differential/Platelet, Comprehensive metabolic panel, TSH  Numbness - Plan: Vitamin B12, Copper, serum  Ataxic gait - Plan: Vitamin B12, Copper, serum  High risk medication use - Plan: IgG, IgA, IgM, T4, CBC with Differential/Platelet, Comprehensive metabolic panel, TSH  Gastric bypass status for obesity - Plan: Vitamin B12, Copper, serum  Depression with anxiety      1.    For MS, continue Ocrevus.   Check CBC, CMP, IgG/IgM/IgA, TSH/T4 2.    Continue baclofen but increase to tid and restart Cymbalta.. 3.    She has much more edema in her legs.. she should use compression stockingsdiscuss with her PCP. 4.    Trial of oxybutynin. Advised to call us or PCP if she thinks she has symptoms of a UTI. 5.   Due to increased numbness/falls and gastric bypass status, we need to check B12 and copper levels.  6.   She will return to see me in 4 months or sooner if she has new or worsening neurologic symptoms.  45-minute face-to-face evaluation with greater than one half the time counseling or coordinating care about her multiple sclerosis and related symptoms.  Mariah Gerstenberger A. Epimenio Foot, MD, PhD, FAAN Certified in Neurology, Clinical Neurophysiology, Sleep Medicine, Pain  Medicine  and Neuroimaging Director, [facility]  [facility] [address], [address] [place], [place] [address] [phone]

## 2018-08-28 ENCOUNTER — Ambulatory Visit: Payer: Federal, State, Local not specified - PPO | Admitting: Medical

## 2018-08-28 ENCOUNTER — Ambulatory Visit (HOSPITAL_BASED_OUTPATIENT_CLINIC_OR_DEPARTMENT_OTHER)
Admission: RE | Admit: 2018-08-28 | Discharge: 2018-08-28 | Disposition: A | Discharge status: Home / Self Care | Payer: Federal, State, Local not specified - PPO | Source: Ambulatory Visit | Attending: Medical | Admitting: Medical

## 2018-08-28 ENCOUNTER — Telehealth: Payer: Self-pay | Admitting: *Deleted

## 2018-08-28 ENCOUNTER — Encounter: Payer: Self-pay | Admitting: Medical

## 2018-08-28 VITALS — BP 120/70 | HR 75 | Temp 97.4°F | Resp 16 | Ht 65.0 in | Wt 158.0 lb

## 2018-08-28 DIAGNOSIS — R600 Localized edema: Secondary | ICD-10-CM

## 2018-08-28 DIAGNOSIS — M79661 Pain in right lower leg: Secondary | ICD-10-CM

## 2018-08-28 DIAGNOSIS — I10 Essential (primary) hypertension: Secondary | ICD-10-CM | POA: Diagnosis not present

## 2018-08-28 DIAGNOSIS — M7989 Other specified soft tissue disorders: Secondary | ICD-10-CM | POA: Diagnosis not present

## 2018-08-28 DIAGNOSIS — M79662 Pain in left lower leg: Secondary | ICD-10-CM

## 2018-08-28 DIAGNOSIS — R5383 Other fatigue: Secondary | ICD-10-CM | POA: Diagnosis not present

## 2018-08-28 LAB — COMPREHENSIVE METABOLIC PANEL
ALT: 79 IU/L — AB (ref 0–32)
ALT: 56 U/L — AB (ref 0–35)
AST: 126 IU/L — AB (ref 0–40)
AST: 79 U/L — AB (ref 0–37)
Albumin/Globulin Ratio: 1.5 (ref 1.2–2.2)
Albumin: 3.8 g/dL (ref 3.5–5.5)
Albumin: 3.6 g/dL (ref 3.5–5.2)
Alkaline Phosphatase: 176 IU/L — ABNORMAL HIGH (ref 39–117)
Alkaline Phosphatase: 134 U/L — ABNORMAL HIGH (ref 39–117)
BILIRUBIN TOTAL: 2.6 mg/dL — AB (ref 0.2–1.2)
BUN/Creatinine Ratio: 27 — ABNORMAL HIGH (ref 9–23)
BUN: 20 mg/dL (ref 6–24)
BUN: 16 mg/dL (ref 6–23)
Bilirubin Total: 1.8 mg/dL — ABNORMAL HIGH (ref 0.0–1.2)
CALCIUM: 9.8 mg/dL (ref 8.7–10.2)
CHLORIDE: 86 mmol/L — AB (ref 96–106)
CO2: 32 mmol/L — ABNORMAL HIGH (ref 20–29)
CO2: 38 meq/L — AB (ref 19–32)
CREATININE: 0.56 mg/dL (ref 0.40–1.20)
Calcium: 9.6 mg/dL (ref 8.4–10.5)
Chloride: 85 mEq/L — ABNORMAL LOW (ref 96–112)
Creatinine, Ser: 0.74 mg/dL (ref 0.57–1.00)
GFR, EST AFRICAN AMERICAN: 103 mL/min/{1.73_m2} (ref >59)
GFR, EST NON AFRICAN AMERICAN: 90 mL/min/{1.73_m2} (ref >59)
GFR: 118.11 mL/min (ref >60.00)
GLUCOSE: 72 mg/dL (ref 65–99)
Globulin, Total: 2.6 g/dL (ref 1.5–4.5)
Glucose, Bld: 84 mg/dL (ref 70–99)
POTASSIUM: 3.8 mmol/L (ref 3.5–5.2)
Potassium: 3.3 mEq/L — ABNORMAL LOW (ref 3.5–5.1)
Sodium: 135 mmol/L (ref 134–144)
Sodium: 131 mEq/L — ABNORMAL LOW (ref 135–145)
TOTAL PROTEIN: 6.4 g/dL (ref 6.0–8.5)
Total Protein: 6.4 g/dL (ref 6.0–8.3)

## 2018-08-28 LAB — CBC WITH DIFFERENTIAL/PLATELET
BASOS ABS: 0 10*3/uL (ref 0.0–0.2)
BASOS ABS: 0 10*3/uL (ref 0.0–0.1)
BASOS: 1 %
Basophils Relative: 0.7 % (ref 0.0–3.0)
EOS (ABSOLUTE): 0 10*3/uL (ref 0.0–0.4)
EOS ABS: 0.1 10*3/uL (ref 0.0–0.7)
Eos: 1 %
Eosinophils Relative: 1.2 % (ref 0.0–5.0)
HEMATOCRIT: 32 % — AB (ref 36.0–46.0)
Hematocrit: 33.3 % — ABNORMAL LOW (ref 34.0–46.6)
Hemoglobin: 11.4 g/dL (ref 11.1–15.9)
Hemoglobin: 11.6 g/dL — ABNORMAL LOW (ref 12.0–15.0)
IMMATURE GRANS (ABS): 0 10*3/uL (ref 0.0–0.1)
IMMATURE GRANULOCYTES: 0 %
LYMPHS PCT: 24 % (ref 12.0–46.0)
LYMPHS: 24 %
Lymphocytes Absolute: 1.6 10*3/uL (ref 0.7–3.1)
Lymphs Abs: 1.1 10*3/uL (ref 0.7–4.0)
MCH: 35.2 pg — ABNORMAL HIGH (ref 26.6–33.0)
MCHC: 34.2 g/dL (ref 31.5–35.7)
MCHC: 36.1 g/dL — ABNORMAL HIGH (ref 30.0–36.0)
MCV: 103 fL — ABNORMAL HIGH (ref 79–97)
MCV: 101.8 fl — AB (ref 78.0–100.0)
MONOS ABS: 0.7 10*3/uL (ref 0.1–0.9)
MONOS PCT: 12.7 % — AB (ref 3.0–12.0)
Monocytes Absolute: 0.6 10*3/uL (ref 0.1–1.0)
Monocytes: 11 %
NEUTROS PCT: 63 %
NEUTROS PCT: 61.4 % (ref 43.0–77.0)
Neutro Abs: 2.9 10*3/uL (ref 1.4–7.7)
Neutrophils Absolute: 4.3 10*3/uL (ref 1.4–7.0)
PLATELETS: 228 10*3/uL (ref 150–450)
Platelets: 213 10*3/uL (ref 150.0–400.0)
RBC: 3.24 x10E6/uL — AB (ref 3.77–5.28)
RBC: 3.14 Mil/uL — AB (ref 3.87–5.11)
RDW: 12.8 % (ref 11.7–15.4)
RDW: 14.3 % (ref 11.5–15.5)
WBC: 6.6 10*3/uL (ref 3.4–10.8)
WBC: 4.7 10*3/uL (ref 4.0–10.5)

## 2018-08-28 LAB — IGG, IGA, IGM
IGM (IMMUNOGLOBULIN M), SRM: 290 mg/dL — AB (ref 26–217)
IgA/Immunoglobulin A, Serum: 414 mg/dL — ABNORMAL HIGH (ref 87–352)
IgG (Immunoglobin G), Serum: 1291 mg/dL (ref 700–1600)

## 2018-08-28 LAB — TSH: TSH: 0.973 u[IU]/mL (ref 0.450–4.500)

## 2018-08-28 LAB — COPPER, SERUM: COPPER: 72 ug/dL (ref 72–166)

## 2018-08-28 LAB — VITAMIN B12
VITAMIN B 12: 1406 pg/mL — AB (ref 232–1245)
Vitamin B-12: 1149 pg/mL — ABNORMAL HIGH (ref 211–911)

## 2018-08-28 LAB — T4: T4 TOTAL: 5.2 ug/dL (ref 4.5–12.0)

## 2018-08-28 LAB — BRAIN NATRIURETIC PEPTIDE: Pro B Natriuretic peptide (BNP): 129 pg/mL — ABNORMAL HIGH (ref 0.0–100.0)

## 2018-08-28 NOTE — Telephone Encounter (Signed)
Received Lab Report results from [facility]; forwarded to provider/SLS 01/09

## 2018-08-28 NOTE — Progress Notes (Signed)
   Subjective:    Patient ID: Catherine Hubbard, female    DOB: [birth year], 59 y.o.   MRN: [id number]  HPI  Pt in for some bilateral lower leg swelling. Pt states swelling for about 3 days.   Pt 02-2018 cxr did not show enlarged heart(also bilateral US lower ext were negative). Swelling is worse at end of the day. No shortness of breath. No direct popliteal pain.   Pt states that she gained about 7 lbs since Monday. She weighed 141 lb on Monday. Today on her scale at home and here weighted 148. She has hx of htn. Pt has had minimal swelling in the past. I had prescribed chorthalidone in past and she has compression stockings. In September for milder swelling did bnp which was not elevated.   Pt states recently started cymbalta. Rx'd neurologist. Also started ditropan just recently.     Review of Systems  Constitutional: Negative for chills, diaphoresis and fatigue.  Respiratory: Negative for cough, choking, shortness of breath and wheezing.   Cardiovascular: Negative for chest pain and palpitations.  Gastrointestinal: Negative for abdominal pain.  Musculoskeletal: Negative for back pain.       See hpi. Pedal edema.  Skin: Negative for rash.  Hematological: Negative for adenopathy. Does not bruise/bleed easily.       Objective:   Physical Exam  General Mental Status- Alert. General Appearance- Not in acute distress.   Skin General: Color- Normal Color. Moisture- Normal Moisture.  Neck Carotid Arteries- Normal color. Moisture- Normal Moisture. No carotid bruits. No JVD.  Chest and Lung Exam Auscultation: Breath Sounds:-Normal.  Cardiovascular Auscultation:Rythm- Regular. Murmurs & Other Heart Sounds:Auscultation of the heart reveals- No Murmurs.  Abdomen Inspection:-Inspeection Normal. Palpation/Percussion:Note:No mass. Palpation and Percussion of the abdomen reveal- Non Tender, Non Distended + BS, no rebound or guarding.    Neurologic Cranial Nerve exam:- CN  III-XII intact(No nystagmus), symmetric smile. Strength:- 5/5 equal and symmetric strength both upper and lower extremities.  Lower ext- bilateral symmetric 2-3+ pedal edema. No redness, faint equal mild warmth. No induration. Negative homans signs bilateraly      Assessment & Plan:  You do have some moderate to severe pedal edema bilaterally today.  Some recent weight gain since Monday as well.  We need to get a chest x-ray to evaluate any indicators of possible CHF.  Also will get BNP/heart failure protein study, metabolic panel, d-dimer and CBC.  Continue to use your compression stockings and elevate your legs daily.  If any indication of the CHF might need to change her diuretic.  Your blood pressure is actually at a good level today after recheck.  So if we need to make any med changes would need to be cautious.  For fatigue, added CBC, B1, B12 and vitamin D.  Follow-up date to be determined after lab and imaging review.  [name], PA-C

## 2018-08-28 NOTE — Telephone Encounter (Signed)
Called, LVM for pt about lab results per Dr. Sater note. Gave [facility] phone number if she has further questions.  

## 2018-08-28 NOTE — Telephone Encounter (Signed)
-----   Message from Asa Lente, MD sent at 08/28/2018  4:47 PM EST ----- Please let the patient know that the lab work is ok.  No B12 or copper deficiency

## 2018-08-28 NOTE — Patient Instructions (Signed)
You do have some moderate to severe pedal edema bilaterally today.  Some recent weight gain since Monday as well.  We need to get a chest x-ray to evaluate any indicators of possible CHF.  Also will get BNP/heart failure protein study, metabolic panel, d-dimer and CBC.  Continue to use your compression stockings and elevate your legs daily.  If any indication of the CHF might need to change her diuretic.  Your blood pressure is actually at a good level today after recheck.  So if we need to make any med changes would need to be cautious.  For fatigue, added CBC, B1, B12 and vitamin D.  Follow-up date to be determined after lab and imaging review.

## 2018-08-30 ENCOUNTER — Telehealth: Payer: Self-pay | Admitting: Medical

## 2018-08-30 DIAGNOSIS — Z1211 Encounter for screening for malignant neoplasm of colon: Secondary | ICD-10-CM

## 2018-08-30 NOTE — Telephone Encounter (Signed)
Referral to GI for colonosocopy.

## 2018-09-01 LAB — D-DIMER, QUANTITATIVE: D-Dimer, Quant: 0.44 mcg/mL FEU (ref <0.50)

## 2018-09-01 LAB — VITAMIN D 1,25 DIHYDROXY
VITAMIN D 1, 25 (OH) TOTAL: 22 pg/mL (ref 18–72)
Vitamin D2 1, 25 (OH)2: <8 pg/mL
Vitamin D3 1, 25 (OH)2: 22 pg/mL

## 2018-09-01 LAB — VITAMIN B1: Vitamin B1 (Thiamine): <6 nmol/L — ABNORMAL LOW (ref 8–30)

## 2018-09-03 ENCOUNTER — Telehealth: Payer: Self-pay | Admitting: Medical

## 2018-09-03 ENCOUNTER — Encounter: Payer: Self-pay | Admitting: Medical

## 2018-09-03 ENCOUNTER — Ambulatory Visit: Payer: Federal, State, Local not specified - PPO | Admitting: Medical

## 2018-09-03 ENCOUNTER — Other Ambulatory Visit (HOSPITAL_BASED_OUTPATIENT_CLINIC_OR_DEPARTMENT_OTHER): Payer: Federal, State, Local not specified - PPO

## 2018-09-03 VITALS — BP 100/74 | HR 90 | Temp 97.8°F | Resp 16 | Ht 65.0 in | Wt 144.8 lb

## 2018-09-03 DIAGNOSIS — D649 Anemia, unspecified: Secondary | ICD-10-CM | POA: Diagnosis not present

## 2018-09-03 DIAGNOSIS — I10 Essential (primary) hypertension: Secondary | ICD-10-CM | POA: Diagnosis not present

## 2018-09-03 DIAGNOSIS — K760 Fatty (change of) liver, not elsewhere classified: Secondary | ICD-10-CM

## 2018-09-03 DIAGNOSIS — M79662 Pain in left lower leg: Secondary | ICD-10-CM | POA: Diagnosis not present

## 2018-09-03 DIAGNOSIS — R600 Localized edema: Secondary | ICD-10-CM | POA: Diagnosis not present

## 2018-09-03 DIAGNOSIS — R748 Abnormal levels of other serum enzymes: Secondary | ICD-10-CM | POA: Diagnosis not present

## 2018-09-03 DIAGNOSIS — E519 Thiamine deficiency, unspecified: Secondary | ICD-10-CM

## 2018-09-03 DIAGNOSIS — E876 Hypokalemia: Secondary | ICD-10-CM

## 2018-09-03 LAB — CBC WITH DIFFERENTIAL/PLATELET
Basophils Absolute: 0.1 10*3/uL (ref 0.0–0.1)
Basophils Relative: 1.2 % (ref 0.0–3.0)
EOS ABS: 0.1 10*3/uL (ref 0.0–0.7)
Eosinophils Relative: 1.1 % (ref 0.0–5.0)
HCT: 35.1 % — ABNORMAL LOW (ref 36.0–46.0)
HEMOGLOBIN: 12.4 g/dL (ref 12.0–15.0)
Lymphocytes Relative: 19.8 % (ref 12.0–46.0)
Lymphs Abs: 1.3 10*3/uL (ref 0.7–4.0)
MCHC: 35.3 g/dL (ref 30.0–36.0)
MCV: 102.3 fl — ABNORMAL HIGH (ref 78.0–100.0)
Monocytes Absolute: 1.3 10*3/uL — ABNORMAL HIGH (ref 0.1–1.0)
Monocytes Relative: 18.6 % — ABNORMAL HIGH (ref 3.0–12.0)
Neutro Abs: 4 10*3/uL (ref 1.4–7.7)
Neutrophils Relative %: 59.3 % (ref 43.0–77.0)
Platelets: 304 10*3/uL (ref 150.0–400.0)
RBC: 3.43 Mil/uL — AB (ref 3.87–5.11)
RDW: 13.7 % (ref 11.5–15.5)
WBC: 6.8 10*3/uL (ref 4.0–10.5)

## 2018-09-03 LAB — COMPREHENSIVE METABOLIC PANEL
ALBUMIN: 3.7 g/dL (ref 3.5–5.2)
ALT: 28 U/L (ref 0–35)
AST: 50 U/L — AB (ref 0–37)
Alkaline Phosphatase: 105 U/L (ref 39–117)
BUN: 12 mg/dL (ref 6–23)
CHLORIDE: 80 meq/L — AB (ref 96–112)
CO2: 39 mEq/L — ABNORMAL HIGH (ref 19–32)
CREATININE: 0.68 mg/dL (ref 0.40–1.20)
Calcium: 9.8 mg/dL (ref 8.4–10.5)
GFR: 94.39 mL/min (ref >60.00)
GLUCOSE: 92 mg/dL (ref 70–99)
Potassium: 3.7 mEq/L (ref 3.5–5.1)
Sodium: 127 mEq/L — ABNORMAL LOW (ref 135–145)
TOTAL PROTEIN: 6.5 g/dL (ref 6.0–8.3)
Total Bilirubin: 1.3 mg/dL — ABNORMAL HIGH (ref 0.2–1.2)

## 2018-09-03 LAB — IRON, TOTAL/TOTAL IRON BINDING CAP
%SAT: 25 % (calc) (ref 16–45)
Iron: 71 ug/dL (ref 45–160)
TIBC: 284 mcg/dL (calc) (ref 250–450)

## 2018-09-03 LAB — GAMMA GT: GGT: 293 U/L — AB (ref 7–51)

## 2018-09-03 NOTE — Progress Notes (Signed)
Subjective:    Patient ID: Catherine Hubbard, female    DOB: [birth year], 59 y.o.   MRN: [id number]  HPI  Pt in for follow up.  In for pedal edema. Pt cxr did not show chf. No weight gain. She states actually lost some since last visit. No dyspnea. No orthopnea. bnp mild elevated 129. Pt used compression stocks for 2 days and helped a lot.   Pt has some low k on labs minimal  Mild liver enzyme elevation. She has known elevated liver enzymes.  Pt has mild anemia. No black or bloody stools. Level very close to normal.  Pt has low b1 and she just got b1 supplemantation.  Pt last egd and colonoscopy with Dr.  Kathline Magic with [facility])  Pt points to distal medial calf. Mild tender for years. But little worse and feel mild different.  Pt has surgery in that lower ext. She had plate and screw.  Review of Systems  Constitutional: Negative for chills, fatigue and fever.  Respiratory: Negative for cough, chest tightness, shortness of breath and wheezing.   Cardiovascular: Negative for chest pain and palpitations.  Gastrointestinal: Negative for abdominal distention, abdominal pain, constipation, diarrhea and vomiting.  Musculoskeletal: Negative for back pain, gait problem, myalgias and neck stiffness.  Skin: Negative for rash.  Neurological: Negative for dizziness, syncope, weakness and headaches.  Hematological: Negative for adenopathy. Does not bruise/bleed easily.  Psychiatric/Behavioral: Negative for behavioral problems, confusion and sleep disturbance. The patient is not nervous/anxious.     Past Medical History:  Diagnosis Date  . Asthma   . Esophagitis   . GERD (gastroesophageal reflux disease)   . Headache   . Hearing loss   . Hypertension   . Multiple sclerosis (HCC)   . Vision abnormalities      Social History   Socioeconomic History  . Marital status: Married    Spouse name: Not on file  . Number of children: Not on file  . Years of education: Not  on file  . Highest education level: Not on file  Occupational History  . Not on file  Social Needs  . Financial resource strain: Not on file  . Food insecurity:    Worry: Not on file    Inability: Not on file  . Transportation needs:    Medical: Not on file    Non-medical: Not on file  Tobacco Use  . Smoking status: Never Smoker  . Smokeless tobacco: Never Used  . Tobacco comment: second hand smoker when young. Heavy exposure.  Substance and Sexual Activity  . Alcohol use: Yes    Alcohol/week: 0.0 standard drinks    Comment: Sts. she drinks beer, liquor daily, varying amts/fim  . Drug use: No  . Sexual activity: Never  Lifestyle  . Physical activity:    Days per week: Not on file    Minutes per session: Not on file  . Stress: Not on file  Relationships  . Social connections:    Talks on phone: Not on file    Gets together: Not on file    Attends religious service: Not on file    Active member of club or organization: Not on file    Attends meetings of clubs or organizations: Not on file    Relationship status: Not on file  . Intimate partner violence:    Fear of current or ex partner: Not on file    Emotionally abused: Not on file    Physically abused: Not  on file    Forced sexual activity: Not on file  Other Topics Concern  . Not on file  Social History Narrative  . Not on file    Past Surgical History:  Procedure Laterality Date  . ANKLE FRACTURE SURGERY Bilateral   . CARPAL TUNNEL RELEASE Bilateral   . COLONOSCOPY  11/2016   multiple  . ESOPHAGOGASTRODUODENOSCOPY  11/2016  . ESOPHAGOGASTRODUODENOSCOPY N/A 02/01/2017   Procedure: ESOPHAGOGASTRODUODENOSCOPY (EGD);  Surgeon: Iva Boop, MD;  Location: [facility] ENDOSCOPY;  Service: Endoscopy;  Laterality: N/A;  . ESOPHAGOGASTRODUODENOSCOPY (EGD) WITH PROPOFOL N/A 02/04/2017   Procedure: ESOPHAGOGASTRODUODENOSCOPY (EGD) WITH PROPOFOL;  Surgeon: Meryl Dare, MD;  Location[facility] ENDOSCOPY;  Service: Endoscopy;   Laterality: N/A;  . LAPAROSCOPIC GASTRIC SLEEVE RESECTION  2011  . ULNAR NERVE REPAIR Bilateral     Family History  Problem Relation Age of Onset  . Cancer Mother   . Stroke Mother   . Cancer Father     Allergies  Allergen Reactions  . 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 Nsaids     Gastric bypass    Current Outpatient Medications on File Prior to Visit  Medication Sig Dispense Refill  . baclofen (LIORESAL) 10 MG tablet Take 1 tablet (10 mg total) by mouth 3 (three) times daily. 90 each 05  . chlorthalidone (HYGROTON) 25 MG tablet TAKE 1 TABLET BY MOUTH EVERY DAY 30 tablet 3  . DULoxetine (CYMBALTA) 60 MG capsule Take 1 capsule (60 mg total) by mouth daily. 30 capsule 11  . nitrofurantoin, macrocrystal-monohydrate, (MACROBID) 100 MG capsule Take 100 mg by mouth 2 (two) times daily.    Marland Kitchen ocrelizumab 600 mg in sodium chloride 0.9 % 500 mL Inject 600 mg into the vein every 6 (six) months. Last infusion May 2019 - due in December 2019    . omeprazole (PRILOSEC) 20 MG capsule Take 40 mg by mouth 2 (two) times daily.    Marland Kitchen oxybutynin (DITROPAN) 5 MG tablet Take 1 tablet (5 mg total) by mouth 2 (two) times daily. 60 tablet 5  . phenazopyridine (PYRIDIUM) 200 MG tablet TAKE 1 TABLET BY MOUTH THREE TIMES A DAY MAY TURN URINE ORANGE     No current facility-administered medications on file prior to visit.     BP 100/74   Pulse 90   Temp 97.8 F (36.6 C) (Oral)   Resp 16   Ht 5\' 5"  (1.651 m)   Wt 144 lb 12.8 oz (65.7 kg)   LMP 08/13/2014 Comment: very few periods  SpO2 99%   BMI 24.10 kg/m       Objective:   Physical Exam  General Mental Status- Alert. General Appearance- Not in acute distress.   Skin General: Color- Normal Color. Moisture- Normal Moisture.  Neck Carotid Arteries- Normal color. Moisture- Normal Moisture. No carotid bruits. No JVD.  Chest and Lung Exam Auscultation: Breath  Sounds:-Normal.  Cardiovascular Auscultation:Rythm- Regular. Murmurs & Other Heart Sounds:Auscultation of the heart reveals- No Murmurs.  Abdomen Inspection:-Inspeection Normal. Palpation/Percussion:Note:No mass. Palpation and Percussion of the abdomen reveal- Non Tender, Non Distended + BS, no rebound or guarding.    Neurologic Cranial Nerve exam:- CN III-XII intact(No nystagmus), symmetric smile. Strength:- 5/5 equal and symmetric strength both upper and lower extremities.  Lower ext- no pedal edema today. Negative homans sign. But  Faint mild tenderness to palpation medial distal calf area. Small faint dilated vein. Not indurated. No warmth.  Assessment & Plan:  Your blood pressure is a little on the low side today.  We will recommend that you stay well-hydrated.  Cut back on any excess alcohol use as we discussed today.  You do have some mild anemia on recent labs.  Your blood pressure is on the lower side.  You are on a blood pressure medication but I do want to repeat your CBC and get iron level.  Make sure blood volume drop not accounting for slight hypotension.  Pedal edema has improved.  Appeared to improve with iron compression stockings.  Chest x-ray did not show heart failure.  Only slight BNP elevation.  If clinically you get recurrent pedal edema, weight gain or shortness of breath and would repeat chest x-ray and BNP.  For low potassium recently, will repeat metabolic panel.  If still low then would recommend just 1 tablet of over-the-counter potassium once a day for 3 days.  This should bring your potassium in line.  History of some fatty liver and a low B1.  Recommend cutting back or stopping alcohol use.  Continue with B1 vitamin over-the-counter.  For mild left lower extremity pain left distal calf region, I did go ahead and place ultrasound order of the lower extremity.  On exam you might have small dilated vein.  Will evaluate for superficial phlebitis and make  sure you do not have any DVT.  You can apply warm compresses to that area twice daily.(sceduled for today/tonight)  Follow-up date to be determined after lab and imaging review.  40 minutes spent with pt. 50% of time counseling on tx and work up various dx and answering question.  [name], PA-C

## 2018-09-03 NOTE — Telephone Encounter (Signed)
Referral to gi placed. Pt wants to see Dr. Lasandra Beech at [facility]. I put referral in already for screening colonoscopy.

## 2018-09-03 NOTE — Patient Instructions (Addendum)
Your blood pressure is a little on the low side today.  We will recommend that you stay well-hydrated.  Cut back on any excess alcohol use as we discussed today.  You do have some mild anemia on recent labs.  Your blood pressure is on the lower side.  You are on a blood pressure medication but I do want to repeat your CBC and get iron level.  Make sure blood volume drop not accounting for slight hypotension.  Pedal edema has improved.  Appeared to improve with iron compression stockings.  Chest x-ray did not show heart failure.  Only slight BNP elevation.  If clinically you get recurrent pedal edema, weight gain or shortness of breath and would repeat chest x-ray and BNP.  For low potassium recently, will repeat metabolic panel.  If still low then would recommend just 1 tablet of over-the-counter potassium once a day for 3 days.  This should bring your potassium in line.  History of some fatty liver and a low B1.  Recommend cutting back or stopping alcohol use.  Continue with B1 vitamin over-the-counter.  For mild left lower extremity pain left distal calf region, I did go ahead and place ultrasound order of the lower extremity.  On exam you might have small dilated vein.  Will evaluate for superficial phlebitis and make sure you do not have any DVT.  You can apply warm compresses to that area twice daily.  Follow-up date to be determined after lab and imaging review.  You are scheduled for 7:30 pm. [place] tonight.

## 2018-09-04 ENCOUNTER — Ambulatory Visit (HOSPITAL_BASED_OUTPATIENT_CLINIC_OR_DEPARTMENT_OTHER)
Admission: RE | Admit: 2018-09-04 | Discharge: 2018-09-04 | Disposition: A | Discharge status: Home / Self Care | Payer: Federal, State, Local not specified - PPO | Source: Ambulatory Visit | Attending: Medical | Admitting: Medical

## 2018-09-04 DIAGNOSIS — R600 Localized edema: Secondary | ICD-10-CM | POA: Diagnosis not present

## 2018-09-04 DIAGNOSIS — M79662 Pain in left lower leg: Secondary | ICD-10-CM | POA: Diagnosis not present

## 2018-09-11 DIAGNOSIS — M5134 Other intervertebral disc degeneration, thoracic region: Secondary | ICD-10-CM | POA: Diagnosis not present

## 2018-09-11 DIAGNOSIS — M531 Cervicobrachial syndrome: Secondary | ICD-10-CM | POA: Diagnosis not present

## 2018-09-11 DIAGNOSIS — M9902 Segmental and somatic dysfunction of thoracic region: Secondary | ICD-10-CM | POA: Diagnosis not present

## 2018-09-11 DIAGNOSIS — M5032 Other cervical disc degeneration, mid-cervical region, unspecified level: Secondary | ICD-10-CM | POA: Diagnosis not present

## 2018-09-13 ENCOUNTER — Other Ambulatory Visit: Payer: Self-pay | Admitting: Diagnostic Neuroimaging

## 2018-09-22 ENCOUNTER — Other Ambulatory Visit: Payer: Self-pay | Admitting: Neurology

## 2018-09-22 ENCOUNTER — Other Ambulatory Visit: Payer: Self-pay | Admitting: Diagnostic Neuroimaging

## 2018-09-22 MED ORDER — MODAFINIL 200 MG PO TABS: 200.0000 mg | ORAL_TABLET | Freq: Every day | ORAL | 0 refills | Status: DC

## 2018-09-22 NOTE — Telephone Encounter (Signed)
Pt is needing a refill on her phenazopyridine (PYRIDIUM) 200 MG tablet  And her pharmacy,  [facility] in [place] states she is needing an approval. Please advise.

## 2018-09-22 NOTE — Telephone Encounter (Signed)
I called pt back. She was requesting refill on modafinil NOT pyridium. I checked OV note and per Dr. Bonnita Hollow note, she had stopped modafinil. She states she restarted because she had some at home. She takes prn. Advised I will send request to Dr. Epimenio Foot. She would [facility] to [facility] on [facility].  I checked drug registry and she last refilled modafinil 01/20/18 #60, not receiving from other MD's. Last seen 08/26/18 and next f/u 12/25/18.

## 2018-09-23 ENCOUNTER — Telehealth: Payer: Self-pay | Admitting: *Deleted

## 2018-09-23 NOTE — Telephone Encounter (Signed)
Submitted PA modafinil on covermymeds. Key: [id number].  PA approved 08/24/2018 through 09/23/2019 via [facility].

## 2018-09-24 ENCOUNTER — Ambulatory Visit: Payer: Self-pay

## 2018-09-24 NOTE — Telephone Encounter (Signed)
Returned call to patient who states she has had a cough for a couple weeks.  She states she has taken robitussin Dm. Today her cough is productive. She says the phlegm is green with blood streaks. She denies fever. She states her sinuses are stuffy. Appointment scheduled per protocol. Care advice read to patient. Pt verbalized understanding of all instructions. Reason for Disposition . Coughing up rusty-colored (reddish-brown) sputum  Answer Assessment - Initial Assessment Questions 1. ONSET: "When did the cough begin?"      Couple weeks 2. SEVERITY: "How bad is the cough today?"      bad 3. RESPIRATORY DISTRESS: "Describe your breathing."      Ok just coughing a lot 4. FEVER: "Do you have a fever?" If so, ask: "What is your temperature, how was it measured, and when did it start?"     no 5. SPUTUM: "Describe the color of your sputum" (clear, white, yellow, green)     green 6. HEMOPTYSIS: "Are you coughing up any blood?" If so ask: "How much?" (flecks, streaks, tablespoons, etc.)     Blood streaks 7. CARDIAC HISTORY: "Do you have any history of heart disease?" (e.g., heart attack, congestive heart failure)      no 8. LUNG HISTORY: "Do you have any history of lung disease?"  (e.g., pulmonary embolus, asthma, emphysema)     athma 9. PE RISK FACTORS: "Do you have a history of blood clots?" (or: recent major surgery, recent prolonged travel, bedridden)     no 10. OTHER SYMPTOMS: "Do you have any other symptoms?" (e.g., runny nose, wheezing, chest pain)       Stuffy nose 11. PREGNANCY: "Is there any chance you are pregnant?" "When was your last menstrual period?"       No  12. TRAVEL: "Have you traveled out of the country in the last month?" (e.g., travel history, exposures)       no  Protocols used: COUGH - ACUTE PRODUCTIVE-A-AH

## 2018-09-25 ENCOUNTER — Emergency Department (HOSPITAL_BASED_OUTPATIENT_CLINIC_OR_DEPARTMENT_OTHER): Payer: Federal, State, Local not specified - PPO

## 2018-09-25 ENCOUNTER — Ambulatory Visit (INDEPENDENT_AMBULATORY_CARE_PROVIDER_SITE_OTHER): Payer: Federal, State, Local not specified - PPO | Admitting: Medical

## 2018-09-25 ENCOUNTER — Encounter (HOSPITAL_BASED_OUTPATIENT_CLINIC_OR_DEPARTMENT_OTHER): Payer: Self-pay | Admitting: Emergency Medicine

## 2018-09-25 ENCOUNTER — Other Ambulatory Visit: Payer: Self-pay

## 2018-09-25 ENCOUNTER — Inpatient Hospital Stay (HOSPITAL_BASED_OUTPATIENT_CLINIC_OR_DEPARTMENT_OTHER)
Admission: EM | Admit: 2018-09-25 | Discharge: 2018-09-28 | DRG: 195 | Disposition: A | Discharge status: Home / Self Care | Payer: Federal, State, Local not specified - PPO | Attending: Internal Medicine | Admitting: Internal Medicine

## 2018-09-25 ENCOUNTER — Encounter: Payer: Self-pay | Admitting: Medical

## 2018-09-25 VITALS — BP 123/82 | HR 82 | Temp 97.7°F | Resp 14 | Wt 147.0 lb

## 2018-09-25 DIAGNOSIS — R0602 Shortness of breath: Secondary | ICD-10-CM | POA: Diagnosis not present

## 2018-09-25 DIAGNOSIS — Z23 Encounter for immunization: Secondary | ICD-10-CM

## 2018-09-25 DIAGNOSIS — E876 Hypokalemia: Secondary | ICD-10-CM | POA: Diagnosis not present

## 2018-09-25 DIAGNOSIS — R05 Cough: Secondary | ICD-10-CM

## 2018-09-25 DIAGNOSIS — R778 Other specified abnormalities of plasma proteins: Secondary | ICD-10-CM | POA: Diagnosis not present

## 2018-09-25 DIAGNOSIS — J4520 Mild intermittent asthma, uncomplicated: Secondary | ICD-10-CM | POA: Diagnosis not present

## 2018-09-25 DIAGNOSIS — R0789 Other chest pain: Secondary | ICD-10-CM | POA: Diagnosis not present

## 2018-09-25 DIAGNOSIS — G2581 Restless legs syndrome: Secondary | ICD-10-CM | POA: Diagnosis not present

## 2018-09-25 DIAGNOSIS — I10 Essential (primary) hypertension: Secondary | ICD-10-CM | POA: Diagnosis present

## 2018-09-25 DIAGNOSIS — R059 Cough, unspecified: Secondary | ICD-10-CM

## 2018-09-25 DIAGNOSIS — J189 Pneumonia, unspecified organism: Secondary | ICD-10-CM | POA: Diagnosis present

## 2018-09-25 DIAGNOSIS — G35 Multiple sclerosis: Secondary | ICD-10-CM | POA: Diagnosis present

## 2018-09-25 DIAGNOSIS — Z809 Family history of malignant neoplasm, unspecified: Secondary | ICD-10-CM | POA: Diagnosis not present

## 2018-09-25 DIAGNOSIS — Z9884 Bariatric surgery status: Secondary | ICD-10-CM | POA: Diagnosis not present

## 2018-09-25 DIAGNOSIS — M791 Myalgia, unspecified site: Secondary | ICD-10-CM | POA: Diagnosis not present

## 2018-09-25 DIAGNOSIS — R7989 Other specified abnormal findings of blood chemistry: Secondary | ICD-10-CM | POA: Diagnosis present

## 2018-09-25 DIAGNOSIS — K210 Gastro-esophageal reflux disease with esophagitis: Secondary | ICD-10-CM | POA: Diagnosis not present

## 2018-09-25 DIAGNOSIS — J159 Unspecified bacterial pneumonia: Secondary | ICD-10-CM | POA: Diagnosis not present

## 2018-09-25 DIAGNOSIS — Z886 Allergy status to analgesic agent status: Secondary | ICD-10-CM | POA: Diagnosis not present

## 2018-09-25 DIAGNOSIS — M940 Chondrocostal junction syndrome [Tietze]: Secondary | ICD-10-CM

## 2018-09-25 DIAGNOSIS — J0100 Acute maxillary sinusitis, unspecified: Secondary | ICD-10-CM

## 2018-09-25 DIAGNOSIS — H9190 Unspecified hearing loss, unspecified ear: Secondary | ICD-10-CM | POA: Diagnosis present

## 2018-09-25 DIAGNOSIS — J209 Acute bronchitis, unspecified: Secondary | ICD-10-CM | POA: Diagnosis not present

## 2018-09-25 DIAGNOSIS — F418 Other specified anxiety disorders: Secondary | ICD-10-CM | POA: Diagnosis present

## 2018-09-25 DIAGNOSIS — Z823 Family history of stroke: Secondary | ICD-10-CM | POA: Diagnosis not present

## 2018-09-25 DIAGNOSIS — R079 Chest pain, unspecified: Secondary | ICD-10-CM | POA: Diagnosis not present

## 2018-09-25 DIAGNOSIS — J40 Bronchitis, not specified as acute or chronic: Secondary | ICD-10-CM

## 2018-09-25 DIAGNOSIS — G35A Relapsing-remitting multiple sclerosis: Secondary | ICD-10-CM | POA: Diagnosis present

## 2018-09-25 LAB — COMPREHENSIVE METABOLIC PANEL
ALT: 32 U/L (ref 0–44)
AST: 92 U/L — ABNORMAL HIGH (ref 15–41)
Albumin: 3.7 g/dL (ref 3.5–5.0)
Alkaline Phosphatase: 101 U/L (ref 38–126)
Anion gap: 12 (ref 5–15)
BILIRUBIN TOTAL: 0.7 mg/dL (ref 0.3–1.2)
BUN: 9 mg/dL (ref 6–20)
CO2: 36 mmol/L — ABNORMAL HIGH (ref 22–32)
Calcium: 8.5 mg/dL — ABNORMAL LOW (ref 8.9–10.3)
Chloride: 84 mmol/L — ABNORMAL LOW (ref 98–111)
Creatinine, Ser: 0.44 mg/dL (ref 0.44–1.00)
GFR calc non Af Amer: >60 mL/min (ref >60)
Glucose, Bld: 95 mg/dL (ref 70–99)
Potassium: 2 mmol/L — CL (ref 3.5–5.1)
Sodium: 132 mmol/L — ABNORMAL LOW (ref 135–145)
Total Protein: 7 g/dL (ref 6.5–8.1)

## 2018-09-25 LAB — CBC WITH DIFFERENTIAL/PLATELET
Abs Immature Granulocytes: 0.01 10*3/uL (ref 0.00–0.07)
BASOS PCT: 1 %
Basophils Absolute: 0.1 10*3/uL (ref 0.0–0.1)
Eosinophils Absolute: 0 10*3/uL (ref 0.0–0.5)
Eosinophils Relative: 1 %
HCT: 35.6 % — ABNORMAL LOW (ref 36.0–46.0)
Hemoglobin: 12.5 g/dL (ref 12.0–15.0)
Immature Granulocytes: 0 %
Lymphocytes Relative: 36 %
Lymphs Abs: 1.6 10*3/uL (ref 0.7–4.0)
MCH: 34.1 pg — ABNORMAL HIGH (ref 26.0–34.0)
MCHC: 35.1 g/dL (ref 30.0–36.0)
MCV: 97 fL (ref 80.0–100.0)
Monocytes Absolute: 0.6 10*3/uL (ref 0.1–1.0)
Monocytes Relative: 14 %
NRBC: 0 % (ref 0.0–0.2)
Neutro Abs: 2.1 10*3/uL (ref 1.7–7.7)
Neutrophils Relative %: 48 %
Platelets: 287 10*3/uL (ref 150–400)
RBC: 3.67 MIL/uL — ABNORMAL LOW (ref 3.87–5.11)
RDW: 12.1 % (ref 11.5–15.5)
WBC: 4.4 10*3/uL (ref 4.0–10.5)

## 2018-09-25 LAB — POCT INFLUENZA A/B
Influenza A, POC: NEGATIVE
Influenza B, POC: NEGATIVE

## 2018-09-25 LAB — BRAIN NATRIURETIC PEPTIDE: B Natriuretic Peptide: 201.8 pg/mL — ABNORMAL HIGH (ref 0.0–100.0)

## 2018-09-25 LAB — TROPONIN I
TROPONIN I: 0.07 ng/mL — AB (ref <0.03)
Troponin I: 0.07 ng/mL (ref <0.03)

## 2018-09-25 MED ORDER — AZITHROMYCIN 500 MG IV SOLR
INTRAVENOUS | Status: AC
  Filled 2018-09-25: qty 500

## 2018-09-25 MED ORDER — POTASSIUM CHLORIDE 10 MEQ/100ML IV SOLN
10.0000 meq | Freq: Once | INTRAVENOUS | Status: AC
  Administered 2018-09-25: 10 meq via INTRAVENOUS
  Filled 2018-09-25: qty 100

## 2018-09-25 MED ORDER — SODIUM CHLORIDE 0.9 % IV SOLN
INTRAVENOUS | Status: DC | PRN
  Administered 2018-09-25: 500 mL via INTRAVENOUS

## 2018-09-25 MED ORDER — BENZONATATE 100 MG PO CAPS: 100.0000 mg | ORAL_CAPSULE | Freq: Three times a day (TID) | ORAL | 0 refills | Status: DC | PRN

## 2018-09-25 MED ORDER — PREDNISONE 10 MG PO TABS: ORAL_TABLET | ORAL | 0 refills | Status: DC

## 2018-09-25 MED ORDER — FENTANYL CITRATE (PF) 100 MCG/2ML IJ SOLN
12.5000 ug | Freq: Once | INTRAMUSCULAR | Status: AC
  Administered 2018-09-25: 12.5 ug via INTRAVENOUS
  Filled 2018-09-25: qty 2

## 2018-09-25 MED ORDER — DOXYCYCLINE HYCLATE 100 MG PO TABS: ORAL_TABLET | ORAL | 0 refills | Status: DC

## 2018-09-25 MED ORDER — OSELTAMIVIR PHOSPHATE 75 MG PO CAPS: 75.0000 mg | ORAL_CAPSULE | Freq: Two times a day (BID) | ORAL | 0 refills | Status: DC

## 2018-09-25 MED ORDER — IOPAMIDOL (ISOVUE-370) INJECTION 76%
100.0000 mL | Freq: Once | INTRAVENOUS | Status: AC | PRN
  Administered 2018-09-25: 76 mL via INTRAVENOUS

## 2018-09-25 MED ORDER — SODIUM CHLORIDE 0.9 % IV SOLN
1.0000 g | Freq: Once | INTRAVENOUS | Status: AC
  Administered 2018-09-25: 1 g via INTRAVENOUS
  Filled 2018-09-25: qty 10

## 2018-09-25 MED ORDER — POTASSIUM CHLORIDE CRYS ER 20 MEQ PO TBCR
40.0000 meq | EXTENDED_RELEASE_TABLET | Freq: Once | ORAL | Status: AC
  Administered 2018-09-25: 40 meq via ORAL
  Filled 2018-09-25: qty 2

## 2018-09-25 MED ORDER — SODIUM CHLORIDE 0.9 % IV SOLN
500.0000 mg | Freq: Once | INTRAVENOUS | Status: AC
  Administered 2018-09-25: 500 mg via INTRAVENOUS
  Filled 2018-09-25: qty 500

## 2018-09-25 NOTE — Plan of Care (Signed)
Hospitalist Accept Note:  Catherine Hubbard is a 58 y.o. female with h/o MS, HTN and GERD who presented to [facility] ED after presenting to Urgent Care with 10 days of URI symptoms, followed by diffuse myalgias, weakness and pleuritic chest pain. Cough apparently productive with scant streaking of blood. Rapid flu negative. She was sent to the ED for further management. In the ED, EKG showed diffuse TWI, most prominent in the anterior leads. Potassium 2.0 on BMP. Initial troponin 0.07, second troponin stable at 0.07. No leukocytosis.  CTA chest was obtained showing no evidence for PE, but bilateral multifocal groundglass opacities and interlobular septal thickening suggestive of volume overload versus multifocal pneumonia. Would also consider vasculitis within the differential.  Patient received ceftriaxone and azithromycin in the ED, along with oral and IV potassium. Prior thyroid studies have been normal. She does have prior admissions for syncope, although potassium was normal at that time.  Will admit patient to [facility] as she will likely require Cardiology consultation. Presentation is concerning for pericarditis versus CAP. Doubt ACS although still need additional troponin and repeat EKG. Potassium is profoundly low; ensure that repeat is drawn. Would also give consideration to paraneoplastic syndrome or hypokalemic periodic paralysis if potassium is indeed accurate.  Shari Heritage [place]hertz 09/25/18 9:36 PM

## 2018-09-25 NOTE — ED Notes (Signed)
Date and time results received: 09/25/18 1643 (use smartphrase ".now" to insert current time)  TestTroponin Critical Value: 0.07  Name of Provider Notified: Dr. Deretha Emory  Orders Received? Or Actions Taken?:

## 2018-09-25 NOTE — ED Provider Notes (Signed)
[facility] EMERGENCY DEPARTMENT Provider Note   CSN: [id number] Arrival date & time: 09/25/18  1618     History   Chief Complaint Chief Complaint  Patient presents with  . Chest Pain    HPI Catherine Hubbard is a 58 y.o. female.  Patient sent down from primary care office upstairs.  She went there with concerns for productive cough that she coughed up some blood yesterday some body aches congestion and left-sided intermittent chest pain worse with breathing.  Patient has a history of MS.  Primary care doctor was thinking that she may have flu and was treating with Tamiflu but an evaluation for the left-sided chest wall pain had an EKG that had some T wave abnormalities the patient was referred down here for further evaluation.  Patient did have inverted T waves throughout all of her leads.     Past Medical History:  Diagnosis Date  . Asthma   . Esophagitis   . GERD (gastroesophageal reflux disease)   . Headache   . Hearing loss   . Hypertension   . Multiple sclerosis (HCC)   . Vision abnormalities     Patient Active Problem List   Diagnosis Date Noted  . Arthritis of carpometacarpal joint 03/27/2018  . Nocturnal leg cramps 03/26/2018  . Syncope due to orthostatic hypotension 02/12/2018  . Diarrhea 02/08/2018  . Acute lower UTI 02/08/2018  . Hematoma of scalp 02/08/2018  . Neck pain 01/20/2018  . Foreign body in esophagus   . Esophageal stricture   . Intractable nausea and vomiting 01/31/2017  . Dysphagia   . Severe protein-calorie malnutrition (HCC)   . Nausea & vomiting 01/30/2017  . Hypokalemia 01/30/2017  . Acute kidney injury (HCC) 01/30/2017  . Cystitis 01/30/2017  . Costochondritis 05/11/2016  . Trochanteric bursitis of both hips 03/28/2016  . Upper back pain 03/28/2016  . Bilateral low back pain with bilateral sciatica 07/13/2015  . Right hip pain 07/13/2015  . Lumbar radicular pain 03/21/2015  . Other fatigue 01/11/2015  .  Dysesthesia 01/11/2015  . Urinary urgency 01/11/2015  . Multiple sclerosis (HCC) 10/06/2014  . Numbness 10/06/2014  . Ataxic gait 10/06/2014  . High risk medication use 10/06/2014  . Gastric bypass status for obesity 10/06/2014  . Cognitive changes 10/06/2014  . Depression with anxiety 10/06/2014  . Restless leg 10/06/2014  . Insomnia 10/06/2014  . Essential hypertension 10/06/2014  . Difficulty hearing 01/13/2014  . Other muscle spasm 01/13/2014    Past Surgical History:  Procedure Laterality Date  . ANKLE FRACTURE SURGERY Bilateral   . CARPAL TUNNEL RELEASE Bilateral   . COLONOSCOPY  11/2016   multiple  . ESOPHAGOGASTRODUODENOSCOPY  11/2016  . ESOPHAGOGASTRODUODENOSCOPY N/A 02/01/2017   Procedure: ESOPHAGOGASTRODUODENOSCOPY (EGD);  Surgeon: Iva Boop, MD;  Location: [facility] ENDOSCOPY;  Service: Endoscopy;  Laterality: N/A;  . ESOPHAGOGASTRODUODENOSCOPY (EGD) WITH PROPOFOL N/A 02/04/2017   Procedure: ESOPHAGOGASTRODUODENOSCOPY (EGD) WITH PROPOFOL;  Surgeon: Meryl Dare, MD;  Location: [facility] ENDOSCOPY;  Service: Endoscopy;  Laterality: N/A;  . LAPAROSCOPIC GASTRIC SLEEVE RESECTION  2011  . ULNAR NERVE REPAIR Bilateral      OB History   No obstetric history on file.      Home Medications    Prior to Admission medications   Medication Sig Start Date End Date Taking? Authorizing Provider  baclofen (LIORESAL) 10 MG tablet Take 1 tablet (10 mg total) by mouth 3 (three) times daily. 08/26/18  Yes Sater, Pearletha Furl, MD  chlorthalidone (HYGROTON) 25 MG  tablet TAKE 1 TABLET BY MOUTH EVERY DAY 07/25/18  Yes Saguier, Ramon Dredge, PA-C  DULoxetine (CYMBALTA) 60 MG capsule Take 1 capsule (60 mg total) by mouth daily. 08/26/18  Yes Sater, Pearletha Furl, MD  omeprazole (PRILOSEC) 20 MG capsule Take 40 mg by mouth 2 (two) times daily.   Yes [provider]  oxybutynin (DITROPAN) 5 MG tablet Take 1 tablet (5 mg total) by mouth 2 (two) times daily. 08/26/18  Yes Sater, Pearletha Furl, MD  benzonatate  (TESSALON) 100 MG capsule Take 1 capsule (100 mg total) by mouth 3 (three) times daily as needed for cough. 09/25/18   Saguier, Ramon Dredge, PA-C  doxycycline (VIBRA-TABS) 100 MG tablet 1 tab po bid x 10 days 09/25/18   Saguier, Ramon Dredge, PA-C  modafinil (PROVIGIL) 200 MG tablet Take 1 tablet (200 mg total) by mouth daily. 09/22/18   Sater, Pearletha Furl, MD  nitrofurantoin, macrocrystal-monohydrate, (MACROBID) 100 MG capsule Take 100 mg by mouth 2 (two) times daily. 08/05/18   [provider]  ocrelizumab 600 mg in sodium chloride 0.9 % 500 mL Inject 600 mg into the vein every 6 (six) months. Last infusion May 2019 - due in December 2019    [provider]  oseltamivir (TAMIFLU) 75 MG capsule Take 1 capsule (75 mg total) by mouth 2 (two) times daily. 09/25/18   Saguier, Ramon Dredge, PA-C  phenazopyridine (PYRIDIUM) 200 MG tablet TAKE 1 TABLET BY MOUTH THREE TIMES A DAY MAY TURN URINE ORANGE 08/05/18   [provider]  predniSONE (DELTASONE) 10 MG tablet 4 tab po day 1, 3 tab po day 2, 2 tab po day 3, 1 tab po day 4. 09/25/18   Saguier, Ramon Dredge, PA-C    Family History Family History  Problem Relation Age of Onset  . Cancer Mother   . Stroke Mother   . Cancer Father     Social History Social History   Tobacco Use  . Smoking status: Never Smoker  . Smokeless tobacco: Never Used  . Tobacco comment: second hand smoker when young. Heavy exposure.  Substance Use Topics  . Alcohol use: Yes    Alcohol/week: 0.0 standard drinks    Comment: Sts. she drinks beer, liquor daily, varying amts/fim  . Drug use: No     Allergies   Ibuprofen and Nsaids   Review of Systems Review of Systems  Constitutional: Negative for chills and fever.  HENT: Positive for congestion. Negative for rhinorrhea and sore throat.   Eyes: Negative for visual disturbance.  Respiratory: Positive for shortness of breath. Negative for cough.   Cardiovascular: Positive for chest pain and leg swelling.    Gastrointestinal: Negative for abdominal pain, diarrhea, nausea and vomiting.  Genitourinary: Negative for dysuria.  Musculoskeletal: Negative for back pain and neck pain.  Skin: Negative for rash.  Neurological: Negative for dizziness, light-headedness and headaches.  Hematological: Does not bruise/bleed easily.  Psychiatric/Behavioral: Negative for confusion.     Physical Exam Updated Vital Signs BP (!) 118/92   Pulse 92   Temp 98 F (36.7 C) (Oral)   Resp (!) 21   Wt 66.6 kg   LMP 08/13/2014 Comment: very few periods  SpO2 95%   BMI 24.43 kg/m   Physical Exam Vitals signs and nursing note reviewed.  Constitutional:      General: She is not in acute distress.    Appearance: She is well-developed.  HENT:     Head: Normocephalic and atraumatic.     Mouth/Throat:     Mouth:  Mucous membranes are moist.  Eyes:     Extraocular Movements: Extraocular movements intact.     Conjunctiva/sclera: Conjunctivae normal.     Pupils: Pupils are equal, round, and reactive to light.  Neck:     Musculoskeletal: Normal range of motion and neck supple.  Cardiovascular:     Rate and Rhythm: Normal rate and regular rhythm.     Heart sounds: Normal heart sounds. No murmur.  Pulmonary:     Effort: Pulmonary effort is normal. No respiratory distress.     Breath sounds: Normal breath sounds. No wheezing.  Abdominal:     General: Bowel sounds are normal.     Palpations: Abdomen is soft.     Tenderness: There is no abdominal tenderness.  Musculoskeletal:        General: Swelling present.     Right lower leg: Edema present.     Left lower leg: Edema present.  Skin:    General: Skin is warm and dry.  Neurological:     General: No focal deficit present.     Mental Status: She is alert and oriented to person, place, and time.      ED Treatments / Results  Labs (all labs ordered are listed, but only abnormal results are displayed) Labs Reviewed  COMPREHENSIVE METABOLIC PANEL -  Abnormal; Notable for the following components:      Result Value   Sodium 132 (*)    Potassium 2.0 (*)    Chloride 84 (*)    CO2 36 (*)    Calcium 8.5 (*)    AST 92 (*)    All other components within normal limits  CBC WITH DIFFERENTIAL/PLATELET - Abnormal; Notable for the following components:   RBC 3.67 (*)    HCT 35.6 (*)    MCH 34.1 (*)    All other components within normal limits  TROPONIN I - Abnormal; Notable for the following components:   Troponin I 0.07 (*)    All other components within normal limits  TROPONIN I - Abnormal; Notable for the following components:   Troponin I 0.07 (*)    All other components within normal limits  BRAIN NATRIURETIC PEPTIDE - Abnormal; Notable for the following components:   B Natriuretic Peptide 201.8 (*)    All other components within normal limits  INFLUENZA PANEL BY PCR (TYPE A & B)    EKG EKG Interpretation  Date/Time:  Thursday September 25 2018 16:32:52 EST Ventricular Rate:  86 PR Interval:    QRS Duration: 110 QT Interval:  462 QTC Calculation: 553 R Axis:   75 Text Interpretation:  Sinus rhythm Repol abnrm, global ischemia, diffuse leads Prolonged QT interval Baseline wander in lead(s) I III aVL Diffuse T wave inversion that is new Reconfirmed by Vanetta Mulders [phone]) on 09/25/2018 4:37:04 PM   Radiology Dg Chest 2 View  Result Date: 09/25/2018 CLINICAL DATA:  Chest pain, costal chondritis. History of multiple sclerosis. EXAM: CHEST - 2 VIEW COMPARISON:  Chest radiograph August 28, 2018 FINDINGS: Cardiomediastinal silhouette is normal. Tortuous aorta associated with chronic hypertension. No pleural effusions or focal consolidations. Trachea projects midline and there is no pneumothorax. Mild increased interstitial prominence. Soft tissue planes and included osseous structures are non-suspicious. Surgical clips in the included right abdomen compatible with cholecystectomy. Mild scoliosis. Multiple old bilateral rib fractures.  Old moderate L1 compression fracture. IMPRESSION: Mild interstitial prominence, possible atypical infection without focal consolidation. Multiple old rib fractures. Electronically Signed   By: Michel Santee.D.  On: 09/25/2018 18:09   Ct Angio Chest Pe W/cm &/or Wo Cm  Result Date: 09/25/2018 CLINICAL DATA:  Chest pain, cough, and body aches for 1 week. History of hypertension, gastroesophageal reflux disease, MS, asthma. EXAM: CT ANGIOGRAPHY CHEST WITH CONTRAST TECHNIQUE: Multidetector CT imaging of the chest was performed using the standard protocol during bolus administration of intravenous contrast. Multiplanar CT image reconstructions and MIPs were obtained to evaluate the vascular anatomy. CONTRAST:  76mL ISOVUE-370 IOPAMIDOL (ISOVUE-370) INJECTION 76% COMPARISON:  05/12/2018 FINDINGS: Cardiovascular: Good opacification of the central and segmental pulmonary arteries. No focal filling defects. No evidence of significant pulmonary embolus. Cardiac enlargement with left heart prominence. No pericardial effusions. Normal caliber thoracic aorta. No aortic aneurysm. Mediastinum/Nodes: Esophagus is decompressed. Small esophageal hiatal hernia. 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 This is incompletely included. No significant lymphadenopathy in the chest. Lungs/Pleura: There is diffuse interstitial pattern to the lungs with edema or infiltration throughout the interlobular septa. Additional airspace infiltration throughout the left lung and in the right lung base. This is most likely to represent edema although multifocal pneumonia would be another possibility. No pleural effusions. No pneumothorax. Airways are patent. Upper Abdomen: No acute abnormalities are demonstrated. Musculoskeletal: Mild degenerative changes in the spine. Old bilateral rib fractures. Review of the MIP images confirms the above findings. IMPRESSION: 1. No evidence of significant pulmonary embolus. 2.  Diffuse interstitial and airspace disease throughout both lungs most likely to represent edema although multifocal pneumonia would be another possibility. Electronically Signed   By: Burman NievesWilliam  Stevens M.D.   On: 09/25/2018 19:58    Procedures Procedures (including critical care time)  CRITICAL CARE Performed by: Vanetta MuldersScott Shabana Armentrout Total critical care time: 30 minutes Critical care time was exclusive of separately billable procedures and treating other patients. Critical care was necessary to treat or prevent imminent or life-threatening deterioration. Critical care was time spent personally by me on the following activities: development of treatment plan with patient and/or surrogate as well as nursing, discussions with consultants, evaluation of patient's response to treatment, examination of patient, obtaining history from patient or surrogate, ordering and performing treatments and interventions, ordering and review of laboratory studies, ordering and review of radiographic studies, pulse oximetry and re-evaluation of patient's condition.   Medications Ordered in ED Medications  cefTRIAXone (ROCEPHIN) 1 g in sodium chloride 0.9 % 100 mL IVPB (has no administration in time range)  azithromycin (ZITHROMAX) 500 mg in sodium chloride 0.9 % 250 mL IVPB (500 mg Intravenous New Bag/Given 09/25/18 2032)  azithromycin (ZITHROMAX) 500 MG injection (has no administration in time range)  azithromycin (ZITHROMAX) 500 MG injection (has no administration in time range)  potassium chloride 10 mEq in 100 mL IVPB (0 mEq Intravenous Stopped 09/25/18 2033)  potassium chloride 10 mEq in 100 mL IVPB (0 mEq Intravenous Stopped 09/25/18 1845)  potassium chloride SA (K-DUR,KLOR-CON) CR tablet 40 mEq (40 mEq Oral Given 09/25/18 1744)  iopamidol (ISOVUE-370) 76 % injection 100 mL (76 mLs Intravenous Contrast Given 09/25/18 1934)  fentaNYL (SUBLIMAZE) injection 12.5 mcg (12.5 mcg Intravenous Given 09/25/18 2030)     Initial  Impression / Assessment and Plan / ED Course  I have reviewed the triage vital signs and the nursing notes.  Pertinent labs & imaging results that were available during my care of the patient were reviewed by me and considered in my medical decision making (see chart for details).    Patient sent down from primary care office upstairs.  They were thinking that  she may have flulike symptoms.  She was given a prescription for Tamiflu but then they got concerned about the chest wall pain did an EKG which had some T wave changes so she was sent down here for further evaluation.  On our evaluation here patient certainly was having left-sided pleuritic chest pain intermittently.  That has been going on for about a week she had had some coughing and body ache and not coughing up some phlegm and actually yesterday she had some blood that she coughed up but no large amounts.  Patient otherwise without a lot of flu symptoms.  Also had a complaint of bilateral leg swelling and that is been ongoing for a while.  Patient had Doppler studies of her left lower leg the end of January which was negative for DVT.  Work-up here patient potassium was significantly low at 2.0.  Patient received 10 mEq of potassium IV x3 and 40 mg potassium p.o.  Initial troponin was elevated at 0.07.  Repeat troponin III hours later was still 0.07.  BNP slightly elevated 200 range.  Patient had CT angios of her chest did not show pulmonary embolism.  But will seem to be consistent with pneumonia.  For this patient was started on Rocephin and Zithromax IV.  Patient has not been recently admitted to the hospital in the past 3 months.  Had a CT angios the chest raise concern for either pulmonary edema or for multifocal pneumonia.  As stated patient was started on antibiotics for that particular based on her symptoms she had before.  Just could be an inflammatory process as well.  Discussed with the hospitalist at [facility] and patient  will be admitted.  Patient is EKG had inverted T waves throughout.  This may have been secondary to the hypokalemia.  Admitting physician aware.   Final Clinical Impressions(s) / ED Diagnoses   Final diagnoses:  Hypokalemia  Elevated troponin  Community acquired pneumonia, unspecified laterality    ED Discharge Orders    None       Vanetta Mulders, MD 09/26/18 0005

## 2018-09-25 NOTE — Patient Instructions (Addendum)
You have had about 10 days of preceding upper respiratory infection symptoms with some increasing symptoms indicating probable sinusitis and bronchitis.  Then yesterday began with diffuse myalgias.  We will give doxycycline antibiotic for sinusitis and bronchitis coverage.  For cough, prescribed benzonatate.  You describe productive cough with some blood present.  Will get chest x-ray to evaluate if you have pneumonia.  We are getting flu test today.  Since you muscle aches are diffuse and started within the last 24 hours, I do think it is best to go ahead and give you Tamiflu.  Rapid flu test was done today.  Treating based on early clinical presentation.  False negative rapid flu test are possible.  You have some costochondral junction tenderness on palpation.  Some pain on coughing as well.Marland Kitchen  Appears you have  costochondritis.    Providing you with a 4-day taper dose of prednisone to use for costochondritis.  Unfortunately you cannot take NSAIDs due to allergies.  ekg abnromal. t wave abnormality. II, III, v2, v3, v4, and v5.   I need to send you down stairs to do cardiac protein studies and to repeat ekg. Due to abnormal ekg  Follow-up in 7 days or as needed.

## 2018-09-25 NOTE — ED Notes (Signed)
Date and time results received: 09/25/18 1714 )  Test: potassium Critical Value: 2.0 Name of Provider Notified: Cheral Almas, RN  Orders Received? Or Actions Taken?: no orders given

## 2018-09-25 NOTE — ED Notes (Signed)
Janna Arch, Charity fundraiser and EDP notified of trop

## 2018-09-25 NOTE — ED Triage Notes (Signed)
Pt seen by PCP today and diagnosed with costochondritis but sent here for chest pain work up. Pt reports chest pain for 1 week. Also reports coughing and body aches.

## 2018-09-25 NOTE — Progress Notes (Signed)
Subjective:    Patient ID: Catherine Hubbard, female    DOB: 1961-07-07, 58 y.o.   MRN: [id number]  HPI  Pt states for about 10 days had nasal congestion, chest congestion and cough. She states sinus pain and pnd. Some mucus when blows nose. Cough for about one week. Mild productive but now blood tinged.  Pt has felt cold and hot intermittently.  Some muscle aches that started last night.  Pt has ms.  Pt did not have flu vaccine this year.  Yesterday she was coughing a lot and almost dry heaved she was coughing so much.  She had left side costochandral junction area pain on palpation. Pain on deep inspiration. Some left lower rib area.  Ros- see above. No jaw pain, no arm pain, no sob. Some myalgias diffuse including legs. No gross motor or sensory deficits.      Objective:   Physical Exam  General  Mental Status - Alert. General Appearance - Well groomed. Not in acute distress.  Skin Rashes- No Rashes.  HEENT Head- Normal. Ear Auditory Canal - Left- Normal. Right - Normal.Tympanic Membrane- Left- Normal. Right- Normal. Eye Sclera/Conjunctiva- Left- Normal. Right- Normal. Nose & Sinuses Nasal Mucosa- Left-  Boggy and Congested. Right-  Boggy and  Congested.Bilateral maxillary and frontal sinus pressure. Mouth & Throat Lips: Upper Lip- Normal: no dryness, cracking, pallor, cyanosis, or vesicular eruption. Lower Lip-Normal: no dryness, cracking, pallor, cyanosis or vesicular eruption. Buccal Mucosa- Bilateral- No Aphthous ulcers. Oropharynx- No Discharge or Erythema. Tonsils: Characteristics- Bilateral- No Erythema or Congestion. Size/Enlargement- Bilateral- No enlargement. Discharge- bilateral-None.  Neck Neck- Supple. No Masses.   Chest and Lung Exam Auscultation: Breath Sounds:-Clear even and unlabored.  Cardiovascular Auscultation:Rythm- Regular, rate and rhythm. Murmurs & Other Heart Sounds:Ausculatation of the heart reveal- No  Murmurs.  Lymphatic Head & Neck General Head & Neck Lymphatics: Bilateral: Description- No Localized lymphadenopathy.  Anterior thorax- moderate to severe tenderness on palpation of costochondral junction. Mild pain on coughing.      Assessment & Plan:  You have had about 10 days of preceding upper respiratory infection symptoms with some increasing symptoms indicating probable sinusitis and bronchitis.  Then yesterday began with diffuse myalgias.  We will give doxycycline antibiotic for sinusitis and bronchitis coverage.  For cough, prescribed benzonatate.  You describe productive cough with some blood present.  Will get chest x-ray to evaluate if you have pneumonia.  We are getting flu test today.  Since you muscle aches are diffuse and started within the last 24 hours, I do think it is best to go ahead and give you Tamiflu.  Rapid flu test was done today.  Treating based on early clinical presentation.  False negative rapid flu test are possible.  You have some costochondral junction tenderness on palpation.  Some pain on coughing as well.Marland Kitchen  Appears you have  costochondritis.    Providing you with a 4-day taper dose of prednisone to use for costochondritis.  Unfortunately you cannot take NSAIDs due to allergies.  ekg abnromal. t wave abnormality. II, III, v2, v3, v4, and v5.   I need to send you down stairs to do cardiac protein studies and to repeat ekg. Due to abnormal ekg  Follow-up in 7 days or as needed.  40 minutes spent with patient today.  50% of time spent counseling patient on plan going forward for her probable sinus infection, bronchitis, flulike syndrome and costochondritis.  Later got result to the EKG back and she had T  wave abnormalities which required further counseling and coordination of care sending patient down to the emergency department.  Called ED physician and explained patient's presentation.  Check later in patient's troponin was elevated.   [name], PA-C

## 2018-09-25 NOTE — ED Notes (Addendum)
[facility] notified (Tammy) - patient ready for transport 

## 2018-09-25 NOTE — ED Notes (Signed)
ED Provider at bedside. 

## 2018-09-25 NOTE — ED Notes (Signed)
Date and time results received: 09/25/18  Test: Troponin Critical Value: 0.07 Name of Provider Notified: [facility]

## 2018-09-26 ENCOUNTER — Inpatient Hospital Stay (HOSPITAL_COMMUNITY): Payer: Federal, State, Local not specified - PPO

## 2018-09-26 ENCOUNTER — Encounter (HOSPITAL_COMMUNITY): Payer: Self-pay | Admitting: Internal Medicine

## 2018-09-26 DIAGNOSIS — J189 Pneumonia, unspecified organism: Secondary | ICD-10-CM | POA: Diagnosis not present

## 2018-09-26 DIAGNOSIS — E876 Hypokalemia: Secondary | ICD-10-CM

## 2018-09-26 DIAGNOSIS — R0602 Shortness of breath: Secondary | ICD-10-CM | POA: Diagnosis not present

## 2018-09-26 DIAGNOSIS — R778 Other specified abnormalities of plasma proteins: Secondary | ICD-10-CM | POA: Diagnosis present

## 2018-09-26 DIAGNOSIS — I10 Essential (primary) hypertension: Secondary | ICD-10-CM

## 2018-09-26 DIAGNOSIS — R7989 Other specified abnormal findings of blood chemistry: Secondary | ICD-10-CM

## 2018-09-26 DIAGNOSIS — G35 Multiple sclerosis: Secondary | ICD-10-CM

## 2018-09-26 LAB — BASIC METABOLIC PANEL
Anion gap: 12 (ref 5–15)
BUN: 8 mg/dL (ref 6–20)
CO2: 30 mmol/L (ref 22–32)
Calcium: 8.1 mg/dL — ABNORMAL LOW (ref 8.9–10.3)
Chloride: 88 mmol/L — ABNORMAL LOW (ref 98–111)
Creatinine, Ser: 0.35 mg/dL — ABNORMAL LOW (ref 0.44–1.00)
GFR calc Af Amer: >60 mL/min (ref >60)
GFR calc non Af Amer: >60 mL/min (ref >60)
Glucose, Bld: 82 mg/dL (ref 70–99)
POTASSIUM: 2.8 mmol/L — AB (ref 3.5–5.1)
SODIUM: 130 mmol/L — AB (ref 135–145)

## 2018-09-26 LAB — INFLUENZA PANEL BY PCR (TYPE A & B)
Influenza A By PCR: NEGATIVE
Influenza B By PCR: NEGATIVE

## 2018-09-26 LAB — PHOSPHORUS: Phosphorus: 3.4 mg/dL (ref 2.5–4.6)

## 2018-09-26 LAB — ECHOCARDIOGRAM COMPLETE
Height: 65 in
Weight: 2323.2 [oz_av]

## 2018-09-26 LAB — MAGNESIUM: Magnesium: 1.2 mg/dL — ABNORMAL LOW (ref 1.7–2.4)

## 2018-09-26 LAB — EXPECTORATED SPUTUM ASSESSMENT W REFEX TO RESP CULTURE

## 2018-09-26 LAB — STREP PNEUMONIAE URINARY ANTIGEN: Strep Pneumo Urinary Antigen: NEGATIVE

## 2018-09-26 LAB — EXPECTORATED SPUTUM ASSESSMENT W GRAM STAIN, RFLX TO RESP C

## 2018-09-26 LAB — TROPONIN I
Troponin I: 0.09 ng/mL
Troponin I: 0.08 ng/mL

## 2018-09-26 MED ORDER — INFLUENZA VAC SPLIT QUAD 0.5 ML IM SUSY
0.5000 mL | PREFILLED_SYRINGE | INTRAMUSCULAR | Status: AC
  Administered 2018-09-27: 0.5 mL via INTRAMUSCULAR
  Filled 2018-09-26: qty 0.5

## 2018-09-26 MED ORDER — SODIUM CHLORIDE 0.9 % IV SOLN
INTRAVENOUS | Status: DC | PRN
  Administered 2018-09-26: 250 mL via INTRAVENOUS

## 2018-09-26 MED ORDER — GUAIFENESIN ER 600 MG PO TB12
1200.0000 mg | ORAL_TABLET | Freq: Two times a day (BID) | ORAL | Status: DC | PRN
  Filled 2018-09-26: qty 2

## 2018-09-26 MED ORDER — ACETAMINOPHEN 325 MG PO TABS
650.0000 mg | ORAL_TABLET | Freq: Four times a day (QID) | ORAL | Status: DC | PRN
  Administered 2018-09-26 – 2018-09-28 (×2): 650 mg via ORAL
  Filled 2018-09-26 (×2): qty 2

## 2018-09-26 MED ORDER — BACLOFEN 5 MG HALF TABLET
10.0000 mg | ORAL_TABLET | Freq: Every day | ORAL | Status: DC
  Administered 2018-09-26 – 2018-09-27 (×2): 10 mg via ORAL
  Filled 2018-09-26 (×2): qty 2

## 2018-09-26 MED ORDER — PNEUMOCOCCAL VAC POLYVALENT 25 MCG/0.5ML IJ INJ
0.5000 mL | INJECTION | INTRAMUSCULAR | Status: AC
  Administered 2018-09-27: 0.5 mL via INTRAMUSCULAR
  Filled 2018-09-26: qty 0.5

## 2018-09-26 MED ORDER — ENOXAPARIN SODIUM 40 MG/0.4ML ~~LOC~~ SOLN
40.0000 mg | SUBCUTANEOUS | Status: DC
  Administered 2018-09-26 – 2018-09-27 (×2): 40 mg via SUBCUTANEOUS
  Filled 2018-09-26 (×2): qty 0.4

## 2018-09-26 MED ORDER — OXYBUTYNIN CHLORIDE 5 MG PO TABS
5.0000 mg | ORAL_TABLET | Freq: Two times a day (BID) | ORAL | Status: DC
  Administered 2018-09-26 – 2018-09-28 (×4): 5 mg via ORAL
  Filled 2018-09-26 (×4): qty 1

## 2018-09-26 MED ORDER — MODAFINIL 100 MG PO TABS: 200.0000 mg | ORAL_TABLET | Freq: Every day | ORAL | Status: DC | PRN

## 2018-09-26 MED ORDER — BENZONATATE 100 MG PO CAPS
100.0000 mg | ORAL_CAPSULE | Freq: Three times a day (TID) | ORAL | Status: DC | PRN
  Administered 2018-09-26: 100 mg via ORAL
  Filled 2018-09-26: qty 1

## 2018-09-26 MED ORDER — ADULT MULTIVITAMIN W/MINERALS CH
1.0000 | ORAL_TABLET | Freq: Every day | ORAL | Status: DC
  Administered 2018-09-26 – 2018-09-28 (×3): 1 via ORAL
  Filled 2018-09-26 (×3): qty 1

## 2018-09-26 MED ORDER — POTASSIUM CHLORIDE CRYS ER 20 MEQ PO TBCR
40.0000 meq | EXTENDED_RELEASE_TABLET | Freq: Every day | ORAL | Status: DC
  Administered 2018-09-26 – 2018-09-28 (×3): 40 meq via ORAL
  Filled 2018-09-26 (×3): qty 2

## 2018-09-26 MED ORDER — AZITHROMYCIN 500 MG PO TABS
500.0000 mg | ORAL_TABLET | ORAL | Status: DC
  Administered 2018-09-26 – 2018-09-28 (×3): 500 mg via ORAL
  Filled 2018-09-26 (×3): qty 1

## 2018-09-26 MED ORDER — CYCLOBENZAPRINE HCL 10 MG PO TABS: 10.0000 mg | ORAL_TABLET | Freq: Two times a day (BID) | ORAL | Status: DC | PRN

## 2018-09-26 MED ORDER — GUAIFENESIN 200 MG PO TABS
400.0000 mg | ORAL_TABLET | ORAL | Status: DC | PRN
  Administered 2018-09-26: 400 mg via ORAL
  Filled 2018-09-26 (×2): qty 2

## 2018-09-26 MED ORDER — TRAZODONE HCL 50 MG PO TABS
50.0000 mg | ORAL_TABLET | Freq: Once | ORAL | Status: AC
  Administered 2018-09-26: 50 mg via ORAL
  Filled 2018-09-26: qty 1

## 2018-09-26 MED ORDER — VITAMIN B-1 100 MG PO TABS
100.0000 mg | ORAL_TABLET | Freq: Every day | ORAL | Status: DC
  Administered 2018-09-26 – 2018-09-28 (×3): 100 mg via ORAL
  Filled 2018-09-26 (×3): qty 1

## 2018-09-26 MED ORDER — BIOTIN 7500 MCG PO TABS: 7500.0000 ug | ORAL_TABLET | Freq: Every day | ORAL | Status: DC

## 2018-09-26 MED ORDER — SODIUM CHLORIDE 0.9 % IV SOLN
1.0000 g | INTRAVENOUS | Status: DC
  Administered 2018-09-26 – 2018-09-28 (×3): 1 g via INTRAVENOUS
  Filled 2018-09-26 (×3): qty 10

## 2018-09-26 MED ORDER — DULOXETINE HCL 60 MG PO CPEP
60.0000 mg | ORAL_CAPSULE | Freq: Every day | ORAL | Status: DC
  Administered 2018-09-26 – 2018-09-28 (×3): 60 mg via ORAL
  Filled 2018-09-26 (×3): qty 1

## 2018-09-26 MED ORDER — PANTOPRAZOLE SODIUM 40 MG PO TBEC
40.0000 mg | DELAYED_RELEASE_TABLET | Freq: Every day | ORAL | Status: DC
  Administered 2018-09-26 – 2018-09-28 (×3): 40 mg via ORAL
  Filled 2018-09-26 (×4): qty 1

## 2018-09-26 MED ORDER — ALBUTEROL SULFATE (2.5 MG/3ML) 0.083% IN NEBU: 2.5000 mg | INHALATION_SOLUTION | RESPIRATORY_TRACT | Status: DC | PRN

## 2018-09-26 NOTE — ED Notes (Signed)
Pt on auto VS  

## 2018-09-26 NOTE — H&P (Signed)
History and Physical    Catherine Hubbard VWP:[id number] DOB: [birth year] DOA: 09/25/2018  PCP: Catherine Richters, PA-C  Patient coming from: Home.  I have personally briefly reviewed patient's old medical records available.   Chief Complaint: Cough shortness of breath.  HPI: Catherine Hubbard is a 58 y.o. female with medical history significant of mild intermittent asthma, GERD, esophageal stricture, hypertension and multiple sclerosis who presented to the emergency room with about 10 days history of cough and shortness of breath.  Patient does have intermittent asthma but never had any problem to go to the emergency room.  About 10 days ago she started having some cough with yellow sputum, she will have some wheezing.  2 days ago she had sputum mixed with blood after multiple episodes of coughing that worried her so she went to see her primary care physician yesterday.  Patient was evaluated in the office, she was Prescribe doxycycline prednisone, she was also noted to have some chest pain.  Twelve-lead EKG showed T wave abnormality on anterior and lateral leads so she was sent to ER at med center.  At the [facility] ER, her troponins were 0.07.  EKG was nonischemic.  D-dimer was elevated, a CTA of the chest showed fluid in the fissure versus multifocal pneumonia.  She was treated with Rocephin and azithromycin and transferred to the hospital. Patient denies any fever.  Denies any flulike symptoms.  Denies any sick contacts or recent travel.  Does have some anterior chest pain but mostly related to coughing.  Her sputum is yellow, she had one episode of blood-tinged sputum and since then it is mostly yellowish and greenish sputum production. ED Course: Hemodynamically stable in the ER.  WBC count was normal.  Flu swab was negative.  Potassium was 2.proBNP 200.  CT of the chest with coarse interstitial thickening, no consolidation no PE.  Review of Systems: As per HPI otherwise 10 point  review of systems negative.    Past Medical History:  Diagnosis Date  . Asthma   . Esophagitis   . GERD (gastroesophageal reflux disease)   . Headache   . Hearing loss   . Hypertension   . Multiple sclerosis (HCC)   . Vision abnormalities     Past Surgical History:  Procedure Laterality Date  . ANKLE FRACTURE SURGERY Bilateral   . CARPAL TUNNEL RELEASE Bilateral   . COLONOSCOPY  11/2016   multiple  . ESOPHAGOGASTRODUODENOSCOPY  11/2016  . ESOPHAGOGASTRODUODENOSCOPY N/A 02/01/2017   Procedure: ESOPHAGOGASTRODUODENOSCOPY (EGD);  Surgeon: Iva Boop, MD;  Location: [facility] ENDOSCOPY;  Service: Endoscopy;  Laterality: N/A;  . ESOPHAGOGASTRODUODENOSCOPY (EGD) WITH PROPOFOL N/A 02/04/2017   Procedure: ESOPHAGOGASTRODUODENOSCOPY (EGD) WITH PROPOFOL;  Surgeon: Meryl Dare, MD;  Location: [facility] ENDOSCOPY;  Service: Endoscopy;  Laterality: N/A;  . LAPAROSCOPIC GASTRIC SLEEVE RESECTION  2011  . ULNAR NERVE REPAIR Bilateral      reports that she has never smoked. She has never used smokeless tobacco. She reports current alcohol use. She reports that she does not use drugs.  Allergies  Allergen Reactions  . 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 Nsaids     Gastric bypass    Family History  Problem Relation Age of Onset  . Cancer Mother   . Stroke Mother   . Cancer Father      Prior to Admission medications   Medication Sig Start Date End Date Taking? Authorizing Provider  baclofen (LIORESAL) 10  MG tablet Take 1 tablet (10 mg total) by mouth 3 (three) times daily. Patient taking differently: Take 10 mg by mouth at bedtime.  08/26/18  Yes Sater, Pearletha Furl, MD  Biotin 7500 MCG TABS Take 7,500 mcg by mouth daily.   Yes [provider]  chlorthalidone (HYGROTON) 25 MG tablet TAKE 1 TABLET BY MOUTH EVERY DAY 07/25/18  Yes Saguier, Ramon Dredge, PA-C  cyclobenzaprine (FLEXERIL) 10 MG tablet Take 10 mg by mouth 2 (two) times daily as needed for muscle  spasms. 05/19/18  Yes [provider]  DULoxetine (CYMBALTA) 60 MG capsule Take 1 capsule (60 mg total) by mouth daily. 08/26/18  Yes Sater, Pearletha Furl, MD  modafinil (PROVIGIL) 200 MG tablet Take 1 tablet (200 mg total) by mouth daily. Patient taking differently: Take 200 mg by mouth daily as needed (tired).  09/22/18  Yes Sater, Pearletha Furl, MD  Multiple Vitamin (MULTIVITAMIN WITH MINERALS) TABS tablet Take 1 tablet by mouth daily.   Yes [provider]  nitrofurantoin, macrocrystal-monohydrate, (MACROBID) 100 MG capsule Take 100 mg by mouth daily.  08/05/18  Yes [provider]  ocrelizumab 600 mg in sodium chloride 0.9 % 500 mL Inject 600 mg into the vein every 6 (six) months. Last infusion May 2019 - due in December 2019   Yes [provider]  omeprazole (PRILOSEC) 20 MG capsule Take 40 mg by mouth 2 (two) times daily.   Yes [provider]  oxybutynin (DITROPAN) 5 MG tablet Take 1 tablet (5 mg total) by mouth 2 (two) times daily. 08/26/18  Yes Sater, Pearletha Furl, MD  phenylephrine (SUDAFED PE) 10 MG TABS tablet Take 10 mg by mouth every 4 (four) hours as needed.   Yes [provider]  sodium chloride (OCEAN) 0.65 % SOLN nasal spray Place 1 spray into both nostrils as needed for congestion.   Yes [provider]  thiamine (VITAMIN B-1) 100 MG tablet Take 100 mg by mouth daily.   Yes [provider]  benzonatate (TESSALON) 100 MG capsule Take 1 capsule (100 mg total) by mouth 3 (three) times daily as needed for cough. 09/25/18   Saguier, Ramon Dredge, PA-C  doxycycline (VIBRA-TABS) 100 MG tablet 1 tab po bid x 10 days 09/25/18   Saguier, Ramon Dredge, PA-C  oseltamivir (TAMIFLU) 75 MG capsule Take 1 capsule (75 mg total) by mouth 2 (two) times daily. 09/25/18   Saguier, Ramon Dredge, PA-C  predniSONE (DELTASONE) 10 MG tablet 4 tab po day 1, 3 tab po day 2, 2 tab po day 3, 1 tab po day 4. 09/25/18   Catherine Richters, PA-C    Physical Exam: Vitals:   09/26/18  0715 09/26/18 0830 09/26/18 1051 09/26/18 1058  BP:  127/86  (!) 143/98  Pulse: (!) 103 (!) 103  (!) 108  Resp: (!) 27 (!) 28  20  Temp:    98 F (36.7 C)  TempSrc:    Oral  SpO2: 91% 94%  93%  Weight:   65.9 kg   Height:   5\' 5"  (1.651 m)     Constitutional: NAD, calm, comfortable Vitals:   09/26/18 0715 09/26/18 0830 09/26/18 1051 09/26/18 1058  BP:  127/86  (!) 143/98  Pulse: (!) 103 (!) 103  (!) 108  Resp: (!) 27 (!) 28  20  Temp:    98 F (36.7 C)  TempSrc:    Oral  SpO2: 91% 94%  93%  Weight:   65.9 kg   Height:   5\' 5"  (  1.651 m)    Eyes: PERRL, lids and conjunctivae normal ENMT: Mucous membranes are moist. Posterior pharynx clear of any exudate or lesions.Normal dentition.  Neck: normal, supple, no masses, no thyromegaly Respiratory: Occasional upper airway sounds and fine crackles on expiration.  no wheezing, no crackles. Normal respiratory effort. No accessory muscle use.  Cardiovascular: Regular rate and rhythm, no murmurs / rubs / gallops. No extremity edema. 2+ pedal pulses. No carotid bruits.  Abdomen: no tenderness, no masses palpated. No hepatosplenomegaly. Bowel sounds positive.  Musculoskeletal: no clubbing / cyanosis. No joint deformity upper and lower extremities. Good ROM, no contractures. Normal muscle tone.  Skin: no rashes, lesions, ulcers. No induration Neurologic: CN 2-12 grossly intact. Sensation intact, DTR normal. Strength 5/5 in all 4.  Psychiatric: Normal judgment and insight. Alert and oriented x 3. Normal mood.     Labs on Admission: I have personally reviewed following labs and imaging studies  CBC: Recent Labs  Lab 09/25/18 1643  WBC 4.4  NEUTROABS 2.1  HGB 12.5  HCT 35.6*  MCV 97.0  PLT 287   Basic Metabolic Panel: Recent Labs  Lab 09/25/18 1643 09/26/18 0618  NA 132* 130*  K 2.0* 2.8*  CL 84* 88*  CO2 36* 30  GLUCOSE 95 82  BUN 9 8  CREATININE 0.44 0.35*  CALCIUM 8.5* 8.1*   GFR: Estimated Creatinine Clearance: 69  mL/min (A) (by C-G formula based on SCr of 0.35 mg/dL (L)). Liver Function Tests: Recent Labs  Lab 09/25/18 1643  AST 92*  ALT 32  ALKPHOS 101  BILITOT 0.7  PROT 7.0  ALBUMIN 3.7   No results for input(s): LIPASE, AMYLASE in the last 168 hours. No results for input(s): AMMONIA in the last 168 hours. Coagulation Profile: No results for input(s): INR, PROTIME in the last 168 hours. Cardiac Enzymes: Recent Labs  Lab 09/25/18 1643 09/25/18 2021  TROPONINI 0.07* 0.07*   BNP (last 3 results) Recent Labs    03/19/18 1207 08/28/18 1021  PROBNP 93.0 129.0*   HbA1C: No results for input(s): HGBA1C in the last 72 hours. CBG: No results for input(s): GLUCAP in the last 168 hours. Lipid Profile: No results for input(s): CHOL, HDL, LDLCALC, TRIG, CHOLHDL, LDLDIRECT in the last 72 hours. Thyroid Function Tests: No results for input(s): TSH, T4TOTAL, FREET4, T3FREE, THYROIDAB in the last 72 hours. Anemia Panel: No results for input(s): VITAMINB12, FOLATE, FERRITIN, TIBC, IRON, RETICCTPCT in the last 72 hours. Urine analysis:    Component Value Date/Time   COLORURINE YELLOW 02/07/2018 2019   APPEARANCEUR HAZY (A) 02/07/2018 2019   LABSPEC 1.006 02/07/2018 2019   PHURINE 5.0 02/07/2018 2019   GLUCOSEU NEGATIVE 02/07/2018 2019   HGBUR NEGATIVE 02/07/2018 2019   BILIRUBINUR NEGATIVE 02/07/2018 2019   KETONESUR NEGATIVE 02/07/2018 2019   PROTEINUR NEGATIVE 02/07/2018 2019   NITRITE NEGATIVE 02/07/2018 2019   LEUKOCYTESUR TRACE (A) 02/07/2018 2019    Radiological Exams on Admission: Dg Chest 2 View  Result Date: 09/25/2018 CLINICAL DATA:  Chest pain, costal chondritis. History of multiple sclerosis. EXAM: CHEST - 2 VIEW COMPARISON:  Chest radiograph August 28, 2018 FINDINGS: Cardiomediastinal silhouette is normal. Tortuous aorta associated with chronic hypertension. No pleural effusions or focal consolidations. Trachea projects midline and there is no pneumothorax. Mild increased  interstitial prominence. Soft tissue planes and included osseous structures are non-suspicious. Surgical clips in the included right abdomen compatible with cholecystectomy. Mild scoliosis. Multiple old bilateral rib fractures. Old moderate L1 compression fracture. IMPRESSION: Mild interstitial prominence,  possible atypical infection without focal consolidation. Multiple old rib fractures. Electronically Signed   By: Awilda Metro M.D.   On: 09/25/2018 18:09   Ct Angio Chest Pe W/cm &/or Wo Cm  Result Date: 09/25/2018 CLINICAL DATA:  Chest pain, cough, and body aches for 1 week. History of hypertension, gastroesophageal reflux disease, MS, asthma. EXAM: CT ANGIOGRAPHY CHEST WITH CONTRAST TECHNIQUE: Multidetector CT imaging of the chest was performed using the standard protocol during bolus administration of intravenous contrast. Multiplanar CT image reconstructions and MIPs were obtained to evaluate the vascular anatomy. CONTRAST:  44mL ISOVUE-370 IOPAMIDOL (ISOVUE-370) INJECTION 76% COMPARISON:  05/12/2018 FINDINGS: Cardiovascular: Good opacification of the central and segmental pulmonary arteries. No focal filling defects. No evidence of significant pulmonary embolus. Cardiac enlargement with left heart prominence. No pericardial effusions. Normal caliber thoracic aorta. No aortic aneurysm. Mediastinum/Nodes: Esophagus is decompressed. Small esophageal hiatal hernia. 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 This is incompletely included. No significant lymphadenopathy in the chest. Lungs/Pleura: There is diffuse interstitial pattern to the lungs with edema or infiltration throughout the interlobular septa. Additional airspace infiltration throughout the left lung and in the right lung base. This is most likely to represent edema although multifocal pneumonia would be another possibility. No pleural effusions. No pneumothorax. Airways are patent. Upper Abdomen: No acute abnormalities  are demonstrated. Musculoskeletal: Mild degenerative changes in the spine. Old bilateral rib fractures. Review of the MIP images confirms the above findings. IMPRESSION: 1. No evidence of significant pulmonary embolus. 2. Diffuse interstitial and airspace disease throughout both lungs most likely to represent edema although multifocal pneumonia would be another possibility. Electronically Signed   By: Burman Nieves M.D.   On: 09/25/2018 19:58    EKG: Independently reviewed.  Sinus rhythm.  T wave inversion on also leads mostly consistent with hypokalemia.  Assessment/Plan Principal Problem:   Pneumonia Active Problems:   Multiple sclerosis (HCC)   Essential hypertension   Acute hypokalemia   Elevated troponin     1.  Pneumonia: Suspect atypical pneumonia.  Will treat as bacterial pneumonia with Rocephin and azithromycin.  Agree with admission given severity of symptoms. Antibiotics to treat bacterial pneumonia Chest physiotherapy, incentive spirometry, deep breathing exercises, sputum induction, mucolytic's and bronchodilators.Sputum cultures, blood cultures, Legionella and streptococcal antigen. Supplemental oxygen to keep saturations more than 90%.  2.  Hypokalemia: Severe and persisted.  Replaced aggressively with IV and oral potassium.  Will check magnesium and phosphorus.  Hold hydrochlorothiazide.  Keep on potassium supplement and recheck level in the morning.  3.  Abnormal EKG with borderline elevated troponin: EKG findings mostly consistent with hypokalemia.  Will replace and recheck EKG after correction of potassium.  Troponins are flat, suspect type II injury.  Will check 2D echocardiogram because of abnormal lung findings.  Currently chest pain-free.  4.  Hypertension: Fairly stable.  Holding hydrochlorothiazide.  Will monitor.  5.  Multiple sclerosis: Currently without active symptoms.  Followed outpatient.  PT OT and speech will be consulted.  DVT prophylaxis: Lovenox  subcu. Code Status: Full code. Family Communication: Patient with capacity to make medical decisions. Disposition Plan: Home. Consults called: None. Admission status: Inpatient telemetry.   Dorcas Carrow MD [facility] Pager [phone]  If 7PM-7AM, please contact night-coverage [email or web address] Password [facility]  09/26/2018, 11:38 AM

## 2018-09-26 NOTE — Progress Notes (Signed)
  Echocardiogram 2D Echocardiogram has been performed.  Tye Savoy 09/26/2018, 2:37 PM

## 2018-09-26 NOTE — Progress Notes (Signed)
CRITICAL VALUE ALERT  Critical Value:  Troponin of 0.09  Date & Time Notied:  09/26/2018 @ 12:20 pm  Provider Notified- paged MD  Orders Received/Actions taken:  Will continue to monitor Troponin at this time

## 2018-09-26 NOTE — ED Notes (Signed)
Patient is being transferred to [facility] via [facility].  No family present during this transfer.

## 2018-09-26 NOTE — Progress Notes (Signed)
SATURATION QUALIFICATIONS: (This note is used to comply with regulatory documentation for home oxygen)  Patient Saturations on Room Air at Rest = 94 %  Patient Saturations on Room Air while Ambulating = 93 %  Pt tolerated well

## 2018-09-26 NOTE — Progress Notes (Addendum)
Per Pt-  Pt has weakness in arms and hips due to MS.  Gets infusion every 6 month for it.   Per Pt- last infusion was on the 2nd of January.   Has never fallen, but has issues pulling up with arms.    Pt also states that she has a very difficult time swallowing medium/large size pills.  She gets choked up very easily.  Will have to take her time taking them 1 at a time.

## 2018-09-27 DIAGNOSIS — J189 Pneumonia, unspecified organism: Secondary | ICD-10-CM | POA: Diagnosis not present

## 2018-09-27 DIAGNOSIS — R7989 Other specified abnormal findings of blood chemistry: Secondary | ICD-10-CM | POA: Diagnosis not present

## 2018-09-27 DIAGNOSIS — I10 Essential (primary) hypertension: Secondary | ICD-10-CM | POA: Diagnosis not present

## 2018-09-27 DIAGNOSIS — E876 Hypokalemia: Secondary | ICD-10-CM | POA: Diagnosis not present

## 2018-09-27 LAB — CBC
HCT: 30.5 % — ABNORMAL LOW (ref 36.0–46.0)
HEMOGLOBIN: 10.6 g/dL — AB (ref 12.0–15.0)
MCH: 33.9 pg (ref 26.0–34.0)
MCHC: 34.8 g/dL (ref 30.0–36.0)
MCV: 97.4 fL (ref 80.0–100.0)
Platelets: 191 10*3/uL (ref 150–400)
RBC: 3.13 MIL/uL — ABNORMAL LOW (ref 3.87–5.11)
RDW: 12.2 % (ref 11.5–15.5)
WBC: 3.9 10*3/uL — ABNORMAL LOW (ref 4.0–10.5)
nRBC: 0 % (ref 0.0–0.2)

## 2018-09-27 LAB — PROCALCITONIN: Procalcitonin: <0.1 ng/mL

## 2018-09-27 LAB — BASIC METABOLIC PANEL
Anion gap: 8 (ref 5–15)
BUN: <5 mg/dL — ABNORMAL LOW (ref 6–20)
CO2: 34 mmol/L — ABNORMAL HIGH (ref 22–32)
Calcium: 8.4 mg/dL — ABNORMAL LOW (ref 8.9–10.3)
Chloride: 94 mmol/L — ABNORMAL LOW (ref 98–111)
Creatinine, Ser: 0.6 mg/dL (ref 0.44–1.00)
GFR calc non Af Amer: >60 mL/min (ref >60)
Glucose, Bld: 94 mg/dL (ref 70–99)
Potassium: 3.5 mmol/L (ref 3.5–5.1)
Sodium: 136 mmol/L (ref 135–145)

## 2018-09-27 LAB — TROPONIN I: Troponin I: 0.07 ng/mL (ref <0.03)

## 2018-09-27 MED ORDER — POTASSIUM CHLORIDE CRYS ER 20 MEQ PO TBCR
40.0000 meq | EXTENDED_RELEASE_TABLET | Freq: Once | ORAL | Status: AC
  Administered 2018-09-27: 40 meq via ORAL
  Filled 2018-09-27: qty 2

## 2018-09-27 NOTE — Progress Notes (Signed)
Patient drinks premier protein shakes (chocolate) and is wondering if she could get them while she's here.

## 2018-09-27 NOTE — Progress Notes (Signed)
Patient ambulated up and down the hallway with no complications.. Also doing well with incentive spirometer.

## 2018-09-27 NOTE — Plan of Care (Signed)
  Problem: Education: Goal: Knowledge of General Education information will improve Description Including pain rating scale, medication(s)/side effects and non-pharmacologic comfort measures Outcome: Progressing   Problem: Health Behavior/Discharge Planning: Goal: Ability to manage health-related needs will improve Outcome: Progressing   

## 2018-09-27 NOTE — Progress Notes (Signed)
PROGRESS NOTE    Catherine Hubbard  PYK:[id number] DOB: 1961/05/25 DOA: 09/25/2018 PCP: [name], PA-C  Outpatient Specialists:   Brief Narrative: Patient is a 58 year old female past medical history significant for asthma, hypertension, multiple sclerosis, esophagitis and GERD.  Patient presented to the hospital with 10-day history of cough said to be productive of yellowish-greenish sputum and shortness of breath.  CTA chest and chest x-ray reports noted.  Patient is currently on antibiotics for possible pneumonic process.  However, no fever or chills.  Cardiac BNP is elevated.  Echo is pending.  No prior history of congestive heart failure.  No orthopnea, dyspnea on exertion or paroxysmal nocturnal dyspnea.  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  According to the patient, the ankle edema has been present for 30 years.  Patient seems to be improving.  Will check procalcitonin level.  Will follow echocardiogram report.  We will continue antibiotics for now.  Further management will depend on hospital course.   Assessment & Plan:   Principal Problem:   Pneumonia Active Problems:   Multiple sclerosis (HCC)   Essential hypertension   Acute hypokalemia   Elevated troponin   Possible pneumonia versus acute tracheobronchitis: Suspect atypical pneumonia.  Will treat as bacterial pneumonia with Rocephin and azithromycin.  Agree with admission given severity of symptoms. Antibiotics to treat bacterial pneumonia Chest physiotherapy, incentive spirometry, deep breathing exercises, sputum induction, mucolytic's and bronchodilators.Sputum cultures, blood cultures, Legionella and streptococcal antigen. Supplemental oxygen to keep saturations more than 90%. -Continue antibiotics -Check procalcitonin -Follow culture results.  Hypokalemia: Severe and persisted.  Replaced aggressively with IV and oral potassium.  Will check magnesium and phosphorus.  Hold  hydrochlorothiazide.  Keep on potassium supplement and recheck level in the morning. 09/27/2018: Resolving.  Continue to monitor and replete.  Potassium level is 3.5 today.  Abnormal EKG with borderline elevated troponin: EKG findings mostly consistent with hypokalemia.  Will replace and recheck EKG after correction of potassium.  Troponins are flat, suspect type II injury.  Will check 2D echocardiogram because of abnormal lung findings.  Currently chest pain-free.  Hypertension: Fairly stable.  Holding hydrochlorothiazide.  Will monitor. 09/27/2018: Continue to optimize.  Multiple sclerosis: Currently without active symptoms.  Followed outpatient. 09/27/2018: Stable.  Continue to monitor closely.  PT OT and speech will be consulted.  DVT prophylaxis: Lovenox subcu. Code Status: Full code. Family Communication:  Disposition Plan: Home.  Consultants:   None  Procedures:   Echo is pending  Antimicrobials:   IV Rocephin  IV azithromycin   Subjective: No fever or chills. Cough is improving.  Objective: Vitals:   09/26/18 1659 09/26/18 1922 09/26/18 2305 09/27/18 0523  BP: (!) 136/96 115/83 (!) 131/92 114/72  Pulse: (!) 101 (!) 103 99 99  Resp: 18  20 20   Temp: 97.8 F (36.6 C) 98.2 F (36.8 C) 97.8 F (36.6 C) 98.7 F (37.1 C)  TempSrc: Oral Oral Oral Oral  SpO2: 92% 94% 100% 100%  Weight:    65.5 kg  Height:        Intake/Output Summary (Last 24 hours) at 09/27/2018 1017 Last data filed at 09/27/2018 1013 Gross per 24 hour  Intake 343.8 ml  Output 1800 ml  Net -1456.2 ml   Filed Weights   09/25/18 1624 09/26/18 1051 09/27/18 0523  Weight: 66.6 kg 65.9 kg 65.5 kg    Examination:  General exam: Appears calm and comfortable  Respiratory system: Clear to auscultation.  Cardiovascular system: S1 &  S2 heard. Gastrointestinal system: Abdomen is nondistended, soft and nontender. No organomegaly or masses felt. Normal bowel sounds heard. Central nervous system:  Alert and oriented. No focal neurological deficits. Extremities: Bilateral ankle edema.  Data Reviewed: I have personally reviewed following labs and imaging studies  CBC: Recent Labs  Lab 09/25/18 1643 09/27/18 0621  WBC 4.4 3.9*  NEUTROABS 2.1  --   HGB 12.5 10.6*  HCT 35.6* 30.5*  MCV 97.0 97.4  PLT 287 191   Basic Metabolic Panel: Recent Labs  Lab 09/25/18 1643 09/26/18 0618 09/26/18 1141 09/27/18 0621  NA 132* 130*  --  136  K 2.0* 2.8*  --  3.5  CL 84* 88*  --  94*  CO2 36* 30  --  34*  GLUCOSE 95 82  --  94  BUN 9 8  --  <5*  CREATININE 0.44 0.35*  --  0.60  CALCIUM 8.5* 8.1*  --  8.4*  MG  --   --  1.2*  --   PHOS  --   --  3.4  --    GFR: Estimated Creatinine Clearance: 69 mL/min (by C-G formula based on SCr of 0.6 mg/dL). Liver Function Tests: Recent Labs  Lab 09/25/18 1643  AST 92*  ALT 32  ALKPHOS 101  BILITOT 0.7  PROT 7.0  ALBUMIN 3.7   No results for input(s): LIPASE, AMYLASE in the last 168 hours. No results for input(s): AMMONIA in the last 168 hours. Coagulation Profile: No results for input(s): INR, PROTIME in the last 168 hours. Cardiac Enzymes: Recent Labs  Lab 09/25/18 1643 09/25/18 2021 09/26/18 1141 09/26/18 1618 09/26/18 2243  TROPONINI 0.07* 0.07* 0.09* 0.08* 0.07*   BNP (last 3 results) Recent Labs    03/19/18 1207 08/28/18 1021  PROBNP 93.0 129.0*   HbA1C: No results for input(s): HGBA1C in the last 72 hours. CBG: No results for input(s): GLUCAP in the last 168 hours. Lipid Profile: No results for input(s): CHOL, HDL, LDLCALC, TRIG, CHOLHDL, LDLDIRECT in the last 72 hours. Thyroid Function Tests: No results for input(s): TSH, T4TOTAL, FREET4, T3FREE, THYROIDAB in the last 72 hours. Anemia Panel: No results for input(s): VITAMINB12, FOLATE, FERRITIN, TIBC, IRON, RETICCTPCT in the last 72 hours. Urine analysis:    Component Value Date/Time   COLORURINE YELLOW 02/07/2018 2019   APPEARANCEUR HAZY (A) 02/07/2018  2019   LABSPEC 1.006 02/07/2018 2019   PHURINE 5.0 02/07/2018 2019   GLUCOSEU NEGATIVE 02/07/2018 2019   HGBUR NEGATIVE 02/07/2018 2019   BILIRUBINUR NEGATIVE 02/07/2018 2019   KETONESUR NEGATIVE 02/07/2018 2019   PROTEINUR NEGATIVE 02/07/2018 2019   NITRITE NEGATIVE 02/07/2018 2019   LEUKOCYTESUR TRACE (A) 02/07/2018 2019   Sepsis Labs: @LABRCNTIP (procalcitonin:4,lacticidven:4)  ) Recent Results (from the past 240 hour(s))  Culture, sputum-assessment     Status: None   Collection Time: 09/26/18 11:31 AM  Result Value Ref Range Status   Specimen Description SPU  Final   Special Requests NONE  Final   Sputum evaluation   Final    THIS SPECIMEN IS ACCEPTABLE FOR SPUTUM CULTURE Performed at [facility], [address], [place], [place] [address]    Report Status 09/26/2018 FINAL  Final  Culture, respiratory     Status: None (Preliminary result)   Collection Time: 09/26/18 11:31 AM  Result Value Ref Range Status   Specimen Description SPU  Final   Special Requests NONE Reflexed from [id number]  Final   Gram Stain   Final  MODERATE WBC PRESENT, PREDOMINANTLY PMN RARE GRAM POSITIVE COCCI Performed at [facility], [address] [place], [place] [address]    Culture PENDING  Incomplete   Report Status PENDING  Incomplete         Radiology Studies: Dg Chest 2 View  Result Date: 09/25/2018 CLINICAL DATA:  Chest pain, costal chondritis. History of multiple sclerosis. EXAM: CHEST - 2 VIEW COMPARISON:  Chest radiograph August 28, 2018 FINDINGS: Cardiomediastinal silhouette is normal. Tortuous aorta associated with chronic hypertension. No pleural effusions or focal consolidations. Trachea projects midline and there is no pneumothorax. Mild increased interstitial prominence. Soft tissue planes and included osseous structures are non-suspicious. Surgical clips in the included right abdomen compatible with cholecystectomy. Mild scoliosis. Multiple old bilateral rib  fractures. Old moderate L1 compression fracture. IMPRESSION: Mild interstitial prominence, possible atypical infection without focal consolidation. Multiple old rib fractures. Electronically Signed   By: Awilda Metroourtnay  Bloomer M.D.   On: 09/25/2018 18:09   Ct Angio Chest Pe W/cm &/or Wo Cm  Result Date: 09/25/2018 CLINICAL DATA:  Chest pain, cough, and body aches for 1 week. History of hypertension, gastroesophageal reflux disease, MS, asthma. EXAM: CT ANGIOGRAPHY CHEST WITH CONTRAST TECHNIQUE: Multidetector CT imaging of the chest was performed using the standard protocol during bolus administration of intravenous contrast. Multiplanar CT image reconstructions and MIPs were obtained to evaluate the vascular anatomy. CONTRAST:  76mL ISOVUE-370 IOPAMIDOL (ISOVUE-370) INJECTION 76% COMPARISON:  05/12/2018 FINDINGS: Cardiovascular: Good opacification of the central and segmental pulmonary arteries. No focal filling defects. No evidence of significant pulmonary embolus. Cardiac enlargement with left heart prominence. No pericardial effusions. Normal caliber thoracic aorta. No aortic aneurysm. Mediastinum/Nodes: Esophagus is decompressed. Small esophageal hiatal hernia. 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 This is incompletely included. No significant lymphadenopathy in the chest. Lungs/Pleura: There is diffuse interstitial pattern to the lungs with edema or infiltration throughout the interlobular septa. Additional airspace infiltration throughout the left lung and in the right lung base. This is most likely to represent edema although multifocal pneumonia would be another possibility. No pleural effusions. No pneumothorax. Airways are patent. Upper Abdomen: No acute abnormalities are demonstrated. Musculoskeletal: Mild degenerative changes in the spine. Old bilateral rib fractures. Review of the MIP images confirms the above findings. IMPRESSION: 1. No evidence of significant pulmonary  embolus. 2. Diffuse interstitial and airspace disease throughout both lungs most likely to represent edema although multifocal pneumonia would be another possibility. Electronically Signed   By: Burman NievesWilliam  Stevens M.D.   On: 09/25/2018 19:58        Scheduled Meds: . azithromycin  500 mg Oral Q24H  . baclofen  10 mg Oral QHS  . DULoxetine  60 mg Oral Daily  . enoxaparin (LOVENOX) injection  40 mg Subcutaneous Q24H  . multivitamin with minerals  1 tablet Oral Daily  . oxybutynin  5 mg Oral BID  . pantoprazole  40 mg Oral Daily  . potassium chloride  40 mEq Oral Daily  . thiamine  100 mg Oral Daily   Continuous Infusions: . sodium chloride Stopped (09/26/18 0018)  . sodium chloride Stopped (09/26/18 1435)  . cefTRIAXone (ROCEPHIN)  IV Stopped (09/26/18 1415)     LOS: 2 days    Time spent: 25 minutes.  Berton MountSylvester Blimie Vaness, MD  [facility] Pager #: [phone] 7PM-7AM contact night coverage as above

## 2018-09-28 DIAGNOSIS — I10 Essential (primary) hypertension: Secondary | ICD-10-CM | POA: Diagnosis not present

## 2018-09-28 DIAGNOSIS — E876 Hypokalemia: Secondary | ICD-10-CM | POA: Diagnosis not present

## 2018-09-28 DIAGNOSIS — R7989 Other specified abnormal findings of blood chemistry: Secondary | ICD-10-CM | POA: Diagnosis not present

## 2018-09-28 DIAGNOSIS — J189 Pneumonia, unspecified organism: Secondary | ICD-10-CM | POA: Diagnosis not present

## 2018-09-28 LAB — RENAL FUNCTION PANEL
Albumin: 2.8 g/dL — ABNORMAL LOW (ref 3.5–5.0)
Anion gap: 12 (ref 5–15)
BUN: <5 mg/dL — ABNORMAL LOW (ref 6–20)
CO2: 27 mmol/L (ref 22–32)
Calcium: 8.5 mg/dL — ABNORMAL LOW (ref 8.9–10.3)
Chloride: 92 mmol/L — ABNORMAL LOW (ref 98–111)
Creatinine, Ser: 0.59 mg/dL (ref 0.44–1.00)
GFR calc Af Amer: >60 mL/min (ref >60)
GFR calc non Af Amer: >60 mL/min (ref >60)
Glucose, Bld: 86 mg/dL (ref 70–99)
Phosphorus: 2.8 mg/dL (ref 2.5–4.6)
Potassium: 3.5 mmol/L (ref 3.5–5.1)
Sodium: 131 mmol/L — ABNORMAL LOW (ref 135–145)

## 2018-09-28 LAB — CULTURE, RESPIRATORY W GRAM STAIN

## 2018-09-28 LAB — CULTURE, RESPIRATORY

## 2018-09-28 LAB — LEGIONELLA PNEUMOPHILA SEROGP 1 UR AG: L. pneumophila Serogp 1 Ur Ag: NEGATIVE

## 2018-09-28 MED ORDER — TRAMADOL HCL 50 MG PO TABS
50.0000 mg | ORAL_TABLET | Freq: Four times a day (QID) | ORAL | Status: DC | PRN
  Administered 2018-09-28: 50 mg via ORAL
  Filled 2018-09-28: qty 1

## 2018-09-28 MED ORDER — AMOXICILLIN-POT CLAVULANATE 875-125 MG PO TABS: 1.0000 | ORAL_TABLET | Freq: Two times a day (BID) | ORAL | 0 refills | Status: DC

## 2018-09-28 NOTE — Progress Notes (Signed)
Patient had a run of SVT, HR went to 160, non sustaining, pt asymptomatic. Also a 13 beat run of vtach. Will notify on call provider.

## 2018-09-28 NOTE — Discharge Summary (Signed)
Physician Discharge Summary  Patient ID: Catherine Hubbard MRN: [id number] DOB/AGE: 59-11-1960 59 y.o.  Admit date: 09/25/2018 Discharge date: 09/28/2018  Admission Diagnoses:  Discharge Diagnoses:  Principal Problem:   Possible Pneumonia   Possible acute tracheobronchitis. Active Problems:   Multiple sclerosis (HCC)   Essential hypertension   Acute hypokalemia   Elevated troponin   Discharged Condition: stable  Hospital Course: Patient is a 58 year old female, with past medical history significant for asthma, hypertension, multiple sclerosis, esophagitis and GERD.  Patient presented to the hospital with 10-day history of cough said to be productive of yellowish-greenish sputum and shortness of breath.  CTA chest reported diffuse interstitial and airspace disease throughout both lungs, most likely representing edema, although, multifocal pneumonia was considered as a possibility. Chest x-ray reported mild interstitial prominence, possible atypical infection without focal consolidation.  Multiple old rib fractures were also noted on the chest x-ray.  Patient was admitted and managed for possible pneumonia versus acute tracheobronchitis.  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  Echocardiogram revealed EF of 60 to 65% with echo evidence of pseudonormalization and diastolic relaxation.  Cardiac BNP was elevated.  Patient improved significantly with IV antibiotics.  Patient will be discharged back home on oral antibiotics.  The sputum culture grew few MSSA.  Patient is eager to be discharged back home.    Possible pneumonia versus acute tracheobronchitis:  Patient was admitted and initially treated with IV Rocephin and azithromycin.   Sputum culture grew few MSSA.   Patient will be discharged on Augmentin.   Results of CT angios chest, chest x-ray and echocardiogram noted.  Procalcitonin was  less than 0.1. Patient will follow with primary care provider within 1 week of discharge.    Hypokalemia: Potassium was tolerated on presentation.   Patient was on chlorthalidone prior to presentation.   Low potassium was repleted.   Chlorthalidone has been discontinued. Continue to monitor potassium level. Continue to monitor and optimize blood pressure.  Abnormal EKG with borderline elevated troponin: Elevated troponin is likely type II elevation.   No chest pain or shortness of breath reported.   Echocardiogram result is as documented above.  Hypertension:  Optimize.   Continue to monitor.    Multiple sclerosis: Stable.    Consults: None  Significant Diagnostic Studies:  Sputum culture grew few MSSA. Procalcitonin was less than 0.1   Discharge Exam: Blood pressure 111/75, pulse 94, temperature 98.9 F (37.2 C), temperature source Oral, resp. rate 20, height 5\' 5"  (1.651 m), weight 66.4 kg, last menstrual period 08/13/2014, SpO2 (!) 88 %.  Disposition: Discharge disposition: 01-Home or Self Care   Discharge Instructions    Diet - low sodium heart healthy   Complete by:  As directed    Increase activity slowly   Complete by:  As directed      Allergies as of 09/28/2018      Reactions   Ibuprofen Other (See Comments)   Cannot take due to gastric bypass   Nsaids    Gastric bypass      Medication List    STOP taking these medications   benzonatate 100 MG capsule Commonly known as:  TESSALON   Biotin 7500 MCG Tabs   chlorthalidone 25 MG tablet Commonly known as:  HYGROTON   doxycycline 100 MG tablet Commonly known as:  VIBRA-TABS   nitrofurantoin (macrocrystal-monohydrate) 100 MG capsule Commonly known as:  MACROBID   oseltamivir  75 MG capsule Commonly known as:  TAMIFLU   phenylephrine 10 MG Tabs tablet Commonly known as:  SUDAFED PE   predniSONE 10 MG tablet Commonly known as:  DELTASONE     TAKE these medications    amoxicillin-clavulanate 875-125 MG tablet Commonly known as:  AUGMENTIN Take 1 tablet by mouth 2 (two) times daily for 7 days.   baclofen 10 MG tablet Commonly known as:  LIORESAL Take 1 tablet (10 mg total) by mouth 3 (three) times daily. What changed:  when to take this   cyclobenzaprine 10 MG tablet Commonly known as:  FLEXERIL Take 10 mg by mouth 2 (two) times daily as needed for muscle spasms.   DULoxetine 60 MG capsule Commonly known as:  CYMBALTA Take 1 capsule (60 mg total) by mouth daily.   modafinil 200 MG tablet Commonly known as:  PROVIGIL Take 1 tablet (200 mg total) by mouth daily. What changed:    when to take this  reasons to take this   multivitamin with minerals Tabs tablet Take 1 tablet by mouth daily.   ocrelizumab 600 mg in sodium chloride 0.9 % 500 mL Inject 600 mg into the vein every 6 (six) months. Last infusion May 2019 - due in December 2019   omeprazole 20 MG capsule Commonly known as:  PRILOSEC Take 40 mg by mouth 2 (two) times daily.   oxybutynin 5 MG tablet Commonly known as:  DITROPAN Take 1 tablet (5 mg total) by mouth 2 (two) times daily.   sodium chloride 0.65 % Soln nasal spray Commonly known as:  OCEAN Place 1 spray into both nostrils as needed for congestion.   thiamine 100 MG tablet Commonly known as:  VITAMIN B-1 Take 100 mg by mouth daily.        SignedBarnetta Chapel 09/28/2018, 1:25 PM

## 2018-09-28 NOTE — Progress Notes (Signed)
On call provider notified on heart rhythm changes.

## 2018-09-28 NOTE — Progress Notes (Signed)
Discharge instructions given to patient, all questions answered and concerns addressed. Patient asked for a note to return to work. MD Dartha Lodge is notified.

## 2018-09-29 ENCOUNTER — Telehealth: Payer: Self-pay | Admitting: Medical

## 2018-09-29 NOTE — Telephone Encounter (Signed)
Copied from CRM [id number]. Topic: Quick Communication - See Telephone Encounter >> Sep 29, 2018  4:14 PM Jens Som A wrote: CRM for notification. See Telephone encounter for: 09/29/18.  Patient is calling because she is unable to take large pills she was given a script of amoxicillin-clavulanate (AUGMENTIN) 875-125 MG tablet [[id number]] And the pills are too large for her. Is Ramon Dredge able to write  script for smaller pills for her? Please advise. [id number] [place], [place] - [address] [phone] (Phone) [phone] (Fax)

## 2018-09-30 ENCOUNTER — Ambulatory Visit: Payer: Federal, State, Local not specified - PPO | Admitting: Neurology

## 2018-09-30 NOTE — Telephone Encounter (Signed)
Patient called back to say it has been two days now that she have been trying to get a change in the antibiotics because the amoxicillin-clavulanate (AUGMENTIN) 875-125 MG tablet are too large and she cannot swallow them. Patient say that she have not taken any of this medication in 2 days and does not want to end up back in the hospital. Please advise

## 2018-09-30 NOTE — Telephone Encounter (Signed)
Pt called back to follow up in request for Ramon Dredge to send in new rx for smaller tablets of amoxicillin-clavulanate (AUGMENTIN) 875-125 MG tablet she was prescribed in the hospital. She has not been able to start her antibiotics to help with her pneumonia. Please advise patient. CB#[phone]

## 2018-10-01 ENCOUNTER — Inpatient Hospital Stay: Payer: Federal, State, Local not specified - PPO | Admitting: Medical

## 2018-10-01 LAB — CULTURE, BLOOD (ROUTINE X 2)
Culture: NO GROWTH
Culture: NO GROWTH
Special Requests: ADEQUATE

## 2018-10-01 MED ORDER — CEFDINIR 300 MG PO CAPS: 300.0000 mg | ORAL_CAPSULE | Freq: Two times a day (BID) | ORAL | 0 refills | Status: DC

## 2018-10-01 NOTE — Telephone Encounter (Signed)
Just saw this request today. Dc augmentin and start cefdnir. It is smaller tablet. Have her schedule hospital follow up as well.

## 2018-10-02 ENCOUNTER — Telehealth: Payer: Self-pay | Admitting: Medical

## 2018-10-02 NOTE — Telephone Encounter (Signed)
Not sure why got paper lab result. But her b1 vitamin is low. Advise pt she can get b1 otc and take daily.

## 2018-10-06 ENCOUNTER — Encounter: Payer: Self-pay | Admitting: Medical

## 2018-10-06 ENCOUNTER — Inpatient Hospital Stay: Payer: Federal, State, Local not specified - PPO | Admitting: Medical

## 2018-10-13 ENCOUNTER — Ambulatory Visit (HOSPITAL_BASED_OUTPATIENT_CLINIC_OR_DEPARTMENT_OTHER)
Admission: RE | Admit: 2018-10-13 | Discharge: 2018-10-13 | Disposition: A | Discharge status: Home / Self Care | Payer: Federal, State, Local not specified - PPO | Source: Ambulatory Visit | Attending: Medical | Admitting: Medical

## 2018-10-13 ENCOUNTER — Ambulatory Visit: Payer: Federal, State, Local not specified - PPO | Admitting: Medical

## 2018-10-13 ENCOUNTER — Encounter: Payer: Self-pay | Admitting: Medical

## 2018-10-13 VITALS — BP 130/80 | HR 80 | Temp 97.7°F | Resp 16 | Ht 65.0 in | Wt 147.6 lb

## 2018-10-13 DIAGNOSIS — I10 Essential (primary) hypertension: Secondary | ICD-10-CM | POA: Diagnosis not present

## 2018-10-13 DIAGNOSIS — R0902 Hypoxemia: Secondary | ICD-10-CM

## 2018-10-13 DIAGNOSIS — R600 Localized edema: Secondary | ICD-10-CM

## 2018-10-13 DIAGNOSIS — E876 Hypokalemia: Secondary | ICD-10-CM | POA: Diagnosis not present

## 2018-10-13 DIAGNOSIS — R7989 Other specified abnormal findings of blood chemistry: Secondary | ICD-10-CM

## 2018-10-13 DIAGNOSIS — J811 Chronic pulmonary edema: Secondary | ICD-10-CM | POA: Diagnosis not present

## 2018-10-13 DIAGNOSIS — Z8709 Personal history of other diseases of the respiratory system: Secondary | ICD-10-CM

## 2018-10-13 DIAGNOSIS — M940 Chondrocostal junction syndrome [Tietze]: Secondary | ICD-10-CM

## 2018-10-13 LAB — COMPREHENSIVE METABOLIC PANEL
ALT: 26 U/L (ref 0–35)
AST: 69 U/L — ABNORMAL HIGH (ref 0–37)
Albumin: 4.1 g/dL (ref 3.5–5.2)
Alkaline Phosphatase: 89 U/L (ref 39–117)
BUN: 10 mg/dL (ref 6–23)
CO2: 37 meq/L — AB (ref 19–32)
Calcium: 9.4 mg/dL (ref 8.4–10.5)
Chloride: 91 mEq/L — ABNORMAL LOW (ref 96–112)
Creatinine, Ser: 0.61 mg/dL (ref 0.40–1.20)
GFR: 100.63 mL/min (ref >60.00)
GLUCOSE: 82 mg/dL (ref 70–99)
Potassium: 4.1 mEq/L (ref 3.5–5.1)
SODIUM: 136 meq/L (ref 135–145)
Total Bilirubin: 0.6 mg/dL (ref 0.2–1.2)
Total Protein: 6.8 g/dL (ref 6.0–8.3)

## 2018-10-13 NOTE — Patient Instructions (Addendum)
You do appear to be doing much better after her hospitalization.  I did review discharge summary notes and follow your hospitalization as well.  Your costochondritis signs and symptoms are now resolved.  Your extensive T wave changes were follow-up to be due to severe low potassium.  Will check potassium levels today.  Your blood pressure is good today.  Continue current regimen but stay off chlorthalidone presently.  After lab review might prescribe you just low-dose hydrochlorothiazide to use intermittently for days when your pedal edema is worse.  With your elevated BNP in the past and some pedal edema presently, did order BNP today and will get chest x-ray.  Will make sure there is no indication of CHF flare on x-ray.  With history of asthma in the past and heavy secondhand smoke exposure, I will go ahead and refer you to a pulmonologist.  On discharge summary you had a low oxygen sat of 88% but today your oxygen on room air was between 96% and 98%.  On review, hospital to treat you for possible pneumonia.  Clinically you appear to be better.  I prescribed cefdinir in place of Augmentin since she could not swallow that post hospitalization.  Regarding your MS, I do want you to get neurologist to fill out the [facility] neurology sheet.  Want to have that filled out please drop that by our office and also off the rest of the form.  Follow-up date to be determined after lab review.

## 2018-10-13 NOTE — Progress Notes (Signed)
Subjective:    Patient ID: Catherine Hubbard, female    DOB: 1960-12-09, 58 y.o.   MRN: [id number]  HPI  Pt is in for hospital follow up visit from hospital admission on 09/25/2018. Discharge date was 09/28/2018.  Pt hospital course was below.  Hospital Course: Patient is a 58 year old female, with past medical history significant for asthma, hypertension, multiple sclerosis, esophagitis and GERD. Patient presented to the hospital with 10-day history of cough said to be productive of yellowish-greenish sputum and shortness of breath. CTA chest reported diffuse interstitial and airspace disease throughout both lungs, most likely representing edema, although, multifocal pneumonia was considered as a possibility. Chest x-ray reported mild interstitial prominence, possible atypical infection without focal consolidation.  Multiple old rib fractures were also noted on the chest x-ray.  Patient was admitted and managed for possible pneumonia versus acute tracheobronchitis.  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  Echocardiogram revealed EF of 60 to 65% with echo evidence of pseudonormalization and diastolic relaxation.  Cardiac BNP was elevated.  Patient improved significantly with IV antibiotics.  Patient will be discharged back home on oral antibiotics.  The sputum culture grew few MSSA.  Patient is eager to be discharged back home.    Possible pneumoniaversus acute tracheobronchitis:  Patient was admitted and initially treated with IV Rocephin and azithromycin.   Sputum culture grew few MSSA.   Patient will be discharged on Augmentin.   Results of CT angios chest, chest x-ray and echocardiogram noted.  Procalcitonin was less than 0.1. Patient will follow with primary care provider within 1 week of discharge.    Hypokalemia: Potassium was tolerated on presentation.   Patient was on  chlorthalidone prior to presentation.   Low potassium was repleted.   Chlorthalidone has been discontinued. Continue to monitor potassium level. Continue to monitor and optimize blood pressure.  Abnormal EKG with borderline elevated troponin: Elevated troponin is likely type II elevation.   No chest pain or shortness of breath reported.   Echocardiogram result is as documented above.  Hypertension:  Optimize.   Continue to monitor.    Multiple sclerosis: Stable.    Consults: None  Significant Diagnostic Studies:  Sputum culture grew few MSSA. Procalcitonin was less than 0.1  ON day of discharge her o2 sat was 88% per review. They mentioned oxygen use at home but patient states no rx given. Pt oxygen today is 98% on room air.  Pt discharged on augmentin. She states too large of tablet so I sent in cefdnir.   Review of Systems  Constitutional: Negative for chills, fatigue and fever.  Respiratory: Negative for cough, chest tightness, shortness of breath and wheezing.   Cardiovascular: Negative for chest pain and palpitations.  Gastrointestinal: Negative for abdominal pain.  Musculoskeletal: Negative for back pain.  Skin: Negative for rash.  Hematological: Negative for adenopathy. Does not bruise/bleed easily.  Psychiatric/Behavioral: Negative for decreased concentration and sleep disturbance. The patient is not nervous/anxious.     Past Medical History:  Diagnosis Date  . Asthma   . Esophagitis   . GERD (gastroesophageal reflux disease)   . Headache   . Hearing loss   . Hypertension   . Multiple sclerosis (HCC)   . Vision abnormalities      Social History   Socioeconomic History  . Marital status: Married    Spouse name: Not on file  . Number of children: Not on  file  . Years of education: Not on file  . Highest education level: Not on file  Occupational History  . Not on file  Social Needs  . Financial resource strain: Not on file  . Food  insecurity:    Worry: Not on file    Inability: Not on file  . Transportation needs:    Medical: Not on file    Non-medical: Not on file  Tobacco Use  . Smoking status: Never Smoker  . Smokeless tobacco: Never Used  . Tobacco comment: second hand smoker when young. Heavy exposure.  Substance and Sexual Activity  . Alcohol use: Yes    Alcohol/week: 0.0 standard drinks    Comment: Sts. she drinks beer, liquor daily, varying amts/fim  . Drug use: No  . Sexual activity: Never  Lifestyle  . Physical activity:    Days per week: Not on file    Minutes per session: Not on file  . Stress: Not on file  Relationships  . Social connections:    Talks on phone: Not on file    Gets together: Not on file    Attends religious service: Not on file    Active member of club or organization: Not on file    Attends meetings of clubs or organizations: Not on file    Relationship status: Not on file  . Intimate partner violence:    Fear of current or ex partner: Not on file    Emotionally abused: Not on file    Physically abused: Not on file    Forced sexual activity: Not on file  Other Topics Concern  . Not on file  Social History Narrative  . Not on file    Past Surgical History:  Procedure Laterality Date  . ANKLE FRACTURE SURGERY Bilateral   . CARPAL TUNNEL RELEASE Bilateral   . COLONOSCOPY  11/2016   multiple  . ESOPHAGOGASTRODUODENOSCOPY  11/2016  . ESOPHAGOGASTRODUODENOSCOPY N/A 02/01/2017   Procedure: ESOPHAGOGASTRODUODENOSCOPY (EGD);  Surgeon: Iva Boop, MD;  Location: [facility] ENDOSCOPY;  Service: Endoscopy;  Laterality: N/A;  . ESOPHAGOGASTRODUODENOSCOPY (EGD) WITH PROPOFOL N/A 02/04/2017   Procedure: ESOPHAGOGASTRODUODENOSCOPY (EGD) WITH PROPOFOL;  Surgeon: Meryl Dare, MD;  Location: [facility] ENDOSCOPY;  Service: Endoscopy;  Laterality: N/A;  . LAPAROSCOPIC GASTRIC SLEEVE RESECTION  2011  . ULNAR NERVE REPAIR Bilateral     Family History  Problem Relation Age of Onset  .  Cancer Mother   . Stroke Mother   . Cancer Father     Allergies  Allergen Reactions  . 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 Nsaids     Gastric bypass    Current Outpatient Medications on File Prior to Visit  Medication Sig Dispense Refill  . baclofen (LIORESAL) 10 MG tablet Take 1 tablet (10 mg total) by mouth 3 (three) times daily. (Patient taking differently: Take 10 mg by mouth at bedtime. ) 90 each 05  . cefdinir (OMNICEF) 300 MG capsule Take 1 capsule (300 mg total) by mouth 2 (two) times daily. 14 capsule 0  . cyclobenzaprine (FLEXERIL) 10 MG tablet Take 10 mg by mouth 2 (two) times daily as needed for muscle spasms.    . DULoxetine (CYMBALTA) 60 MG capsule Take 1 capsule (60 mg total) by mouth daily. 30 capsule 11  . modafinil (PROVIGIL) 200 MG tablet Take 1 tablet (200 mg total) by mouth daily. (Patient taking differently: Take 200 mg by mouth daily as needed (tired). )  30 tablet 0  . Multiple Vitamin (MULTIVITAMIN WITH MINERALS) TABS tablet Take 1 tablet by mouth daily.    Marland Kitchen ocrelizumab 600 mg in sodium chloride 0.9 % 500 mL Inject 600 mg into the vein every 6 (six) months. Last infusion May 2019 - due in December 2019    . omeprazole (PRILOSEC) 20 MG capsule Take 40 mg by mouth 2 (two) times daily.    Marland Kitchen oxybutynin (DITROPAN) 5 MG tablet Take 1 tablet (5 mg total) by mouth 2 (two) times daily. 60 tablet 5  . sodium chloride (OCEAN) 0.65 % SOLN nasal spray Place 1 spray into both nostrils as needed for congestion.    . thiamine (VITAMIN B-1) 100 MG tablet Take 100 mg by mouth daily.     No current facility-administered medications on file prior to visit.     BP (!) 143/91   Pulse 80   Temp 97.7 F (36.5 C) (Oral)   Resp 16   Ht 5\' 5"  (1.651 m)   Wt 147 lb 9.6 oz (67 kg)   LMP 08/13/2014 Comment: very few periods  SpO2 99%   BMI 24.56 kg/m       Objective:   Physical Exam  General Mental Status- Alert. General Appearance- Not in  acute distress.   Skin General: Color- Normal Color. Moisture- Normal Moisture.  Neck Carotid Arteries- Normal color. Moisture- Normal Moisture. No carotid bruits. No JVD.  Chest and Lung Exam Auscultation: Breath Sounds:-Normal.  Cardiovascular Auscultation:Rythm- Regular. Murmurs & Other Heart Sounds:Auscultation of the heart reveals- No Murmurs.  Abdomen Inspection:-Inspeection Normal. Palpation/Percussion:Note:No mass. Palpation and Percussion of the abdomen reveal- Non Tender, Non Distended + BS, no rebound or guarding.   Neurologic Cranial Nerve exam:- CN III-XII intact(No nystagmus), symmetric smile. Strength:- 5/5 equal and symmetric strength both upper and lower extremities.   Lower ext- 1+ pedal edema bilaterally. Symmetric. Negative homans sign.     Assessment & Plan:  You do appear to be doing much better after her hospitalization.  I did review discharge summary notes and follow your hospitalization as well.  Your costochondritis signs and symptoms are now resolved.  Your extensive T wave changes were follow-up to be due to severe low potassium.  Will check potassium levels today.  Your blood pressure is good today.  Continue current regimen but stay off chlorthalidone presently.  After lab review might prescribe you just low-dose hydrochlorothiazide to use intermittently for days when your pedal edema is worse.  With your elevated BNP in the past and some pedal edema presently, did order BNP today and will get chest x-ray.  Will make sure there is no indication of CHF flare on x-ray.  With history of asthma in the past and heavy secondhand smoke exposure, I will go ahead and refer you to a pulmonologist.  On discharge summary you had a low oxygen sat of 88% but today your oxygen on room air was between 96% and 98%.  On review, hospital to treat you for possible pneumonia.  Clinically you appear to be better.  I prescribed cefdinir in place of Augmentin since  she could not swallow that post hospitalization.  Regarding your MS, I do want you to get neurologist to fill out the [facility] neurology sheet.  Want to have that filled out please drop that by our office and also off the rest of the form.  Follow-up date to be determined after lab review.  40 minutes spent with patient today.  50% of time spent  counseling patient on reasons for hospitalization and the post hospitalization work-up that needs to be done to make sure conditions are stable.

## 2018-10-14 ENCOUNTER — Telehealth: Payer: Self-pay | Admitting: Medical

## 2018-10-14 NOTE — Telephone Encounter (Signed)
Attempted to call patient, no answer. Left message for patient to return call for lab results.

## 2018-10-14 NOTE — Telephone Encounter (Signed)
10/14/2018 12:23pm  Pt returned call for results. Please call back at [phone].  Copied from CRM [id number]. Topic: Quick Communication - Lab Results (Clinic Use ONLY) >> Oct 14, 2018 11:38 AM Orlene Och, [place] wrote: Called patient to inform them of 10/13/18 lab results. When patient returns call, triage nurse may disclose results.

## 2018-10-17 ENCOUNTER — Telehealth: Payer: Self-pay | Admitting: Neurology

## 2018-10-17 MED ORDER — METHYLPREDNISOLONE 4 MG PO TBPK: ORAL_TABLET | ORAL | 0 refills | Status: DC

## 2018-10-17 NOTE — Telephone Encounter (Signed)
I called pt back. She is agreeable to try steroid pak. She would like rx to go to [id number] - [place], [place] - [address]. I escribed rx.

## 2018-10-17 NOTE — Telephone Encounter (Signed)
Spoke with Dr. Epimenio Foot- can offer steroid pack to see if this will help with sx.

## 2018-10-17 NOTE — Telephone Encounter (Signed)
Pt called stating that she is in excruciating pain, she is having muscle spasms that bring her legs up off the floor. She is slurring her words. She would like to be advised on what to do to with this pain.

## 2018-10-23 DIAGNOSIS — K222 Esophageal obstruction: Secondary | ICD-10-CM | POA: Diagnosis not present

## 2018-10-24 ENCOUNTER — Telehealth: Payer: Self-pay | Admitting: *Deleted

## 2018-10-24 NOTE — Telephone Encounter (Signed)
Received [place] [employer] DMV Physical Condition Form, most conditions treated by Neurology; forwarded to provider/SLS 03/06

## 2018-10-27 ENCOUNTER — Emergency Department (HOSPITAL_COMMUNITY)
Admission: EM | Admit: 2018-10-27 | Discharge: 2018-10-28 | Disposition: A | Discharge status: Home / Self Care | Payer: Federal, State, Local not specified - PPO | Attending: Emergency Medicine | Admitting: Emergency Medicine

## 2018-10-27 ENCOUNTER — Other Ambulatory Visit: Payer: Self-pay

## 2018-10-27 ENCOUNTER — Encounter (HOSPITAL_COMMUNITY): Payer: Self-pay | Admitting: Emergency Medicine

## 2018-10-27 ENCOUNTER — Telehealth: Payer: Self-pay | Admitting: Neurology

## 2018-10-27 DIAGNOSIS — R1084 Generalized abdominal pain: Secondary | ICD-10-CM | POA: Diagnosis not present

## 2018-10-27 DIAGNOSIS — K209 Esophagitis, unspecified without bleeding: Secondary | ICD-10-CM

## 2018-10-27 DIAGNOSIS — E876 Hypokalemia: Secondary | ICD-10-CM | POA: Diagnosis not present

## 2018-10-27 DIAGNOSIS — R112 Nausea with vomiting, unspecified: Secondary | ICD-10-CM | POA: Diagnosis not present

## 2018-10-27 DIAGNOSIS — H539 Unspecified visual disturbance: Secondary | ICD-10-CM | POA: Diagnosis not present

## 2018-10-27 DIAGNOSIS — I10 Essential (primary) hypertension: Secondary | ICD-10-CM | POA: Insufficient documentation

## 2018-10-27 DIAGNOSIS — Z79899 Other long term (current) drug therapy: Secondary | ICD-10-CM | POA: Insufficient documentation

## 2018-10-27 DIAGNOSIS — G35 Multiple sclerosis: Secondary | ICD-10-CM | POA: Insufficient documentation

## 2018-10-27 DIAGNOSIS — K4090 Unilateral inguinal hernia, without obstruction or gangrene, not specified as recurrent: Secondary | ICD-10-CM | POA: Diagnosis not present

## 2018-10-27 DIAGNOSIS — S0990XA Unspecified injury of head, initial encounter: Secondary | ICD-10-CM | POA: Diagnosis not present

## 2018-10-27 DIAGNOSIS — R55 Syncope and collapse: Secondary | ICD-10-CM | POA: Diagnosis not present

## 2018-10-27 LAB — CBC
HCT: 41.2 % (ref 36.0–46.0)
Hemoglobin: 14.3 g/dL (ref 12.0–15.0)
MCH: 33.5 pg (ref 26.0–34.0)
MCHC: 34.7 g/dL (ref 30.0–36.0)
MCV: 96.5 fL (ref 80.0–100.0)
Platelets: 232 10*3/uL (ref 150–400)
RBC: 4.27 MIL/uL (ref 3.87–5.11)
RDW: 12.7 % (ref 11.5–15.5)
WBC: 6.3 10*3/uL (ref 4.0–10.5)
nRBC: 0 % (ref 0.0–0.2)

## 2018-10-27 LAB — COMPREHENSIVE METABOLIC PANEL
ALT: 55 U/L — AB (ref 0–44)
ANION GAP: 21 — AB (ref 5–15)
AST: 106 U/L — ABNORMAL HIGH (ref 15–41)
Albumin: 3.6 g/dL (ref 3.5–5.0)
Alkaline Phosphatase: 85 U/L (ref 38–126)
BUN: 18 mg/dL (ref 6–20)
CO2: 23 mmol/L (ref 22–32)
Calcium: 9.4 mg/dL (ref 8.9–10.3)
Chloride: 85 mmol/L — ABNORMAL LOW (ref 98–111)
Creatinine, Ser: 0.65 mg/dL (ref 0.44–1.00)
GFR calc non Af Amer: >60 mL/min (ref >60)
Glucose, Bld: 171 mg/dL — ABNORMAL HIGH (ref 70–99)
Potassium: 2.9 mmol/L — ABNORMAL LOW (ref 3.5–5.1)
Sodium: 129 mmol/L — ABNORMAL LOW (ref 135–145)
Total Bilirubin: 4.7 mg/dL — ABNORMAL HIGH (ref 0.3–1.2)
Total Protein: 6.8 g/dL (ref 6.5–8.1)

## 2018-10-27 LAB — I-STAT BETA HCG BLOOD, ED (MC, WL, AP ONLY): I-stat hCG, quantitative: <5 m[IU]/mL (ref <5)

## 2018-10-27 LAB — LIPASE, BLOOD: Lipase: 60 U/L — ABNORMAL HIGH (ref 11–51)

## 2018-10-27 MED ORDER — SODIUM CHLORIDE 0.9% FLUSH
3.0000 mL | Freq: Once | INTRAVENOUS | Status: AC
  Administered 2018-10-28: 3 mL via INTRAVENOUS

## 2018-10-27 NOTE — ED Triage Notes (Signed)
Pt presents after near syncopal episode x 2 today. Pt also c/o abdominal pain with nausea/vomiting after having an EGD done on Thursday.

## 2018-10-27 NOTE — Telephone Encounter (Signed)
Called pt and scheduled appt for tomorrow at 9am with Dr. Epimenio Foot. Pt verbalized understanding and appreciation.

## 2018-10-27 NOTE — Telephone Encounter (Signed)
Pt called to check status of [place] DOT form. She got a letter from [facility] stating they are going to suspend her license if not returned. It will be cancelled eff 11/02/2018. Please call to advise of update.  Call back # [phone]

## 2018-10-27 NOTE — Telephone Encounter (Signed)
I called patient back. Sx started last few days after finshing steroids. Having intermittent dizziness, legs are weak. She has fallen and hit her head but she is ok. Having tremors. Having facial numbness, intermittent.  She is more confused, gets agitated more quickly. Does not feel herself. No signs of infection. Has not started any new medication. Advised I will discuss with Dr. Epimenio Foot and call back to advise

## 2018-10-27 NOTE — Telephone Encounter (Signed)
Pt is asking for a call to discuss her having a lack of sleep, experiencing dizziness when she gets up, her legs have been week for a few days, pt states she has been feeling weakness throughout her entire body., her legs often give out.  Please call

## 2018-10-27 NOTE — Telephone Encounter (Signed)
Spoke with Dr. Epimenio Foot- he would like to bring her in tomorrow for a work in appt to be evaluated

## 2018-10-28 ENCOUNTER — Ambulatory Visit: Payer: Federal, State, Local not specified - PPO | Admitting: Neurology

## 2018-10-28 ENCOUNTER — Encounter (HOSPITAL_COMMUNITY): Payer: Self-pay | Admitting: Emergency Medicine

## 2018-10-28 ENCOUNTER — Emergency Department (HOSPITAL_COMMUNITY): Payer: Federal, State, Local not specified - PPO

## 2018-10-28 ENCOUNTER — Encounter: Payer: Self-pay | Admitting: Neurology

## 2018-10-28 DIAGNOSIS — G35 Multiple sclerosis: Secondary | ICD-10-CM

## 2018-10-28 DIAGNOSIS — H814 Vertigo of central origin: Secondary | ICD-10-CM

## 2018-10-28 DIAGNOSIS — K4090 Unilateral inguinal hernia, without obstruction or gangrene, not specified as recurrent: Secondary | ICD-10-CM | POA: Diagnosis not present

## 2018-10-28 DIAGNOSIS — H539 Unspecified visual disturbance: Secondary | ICD-10-CM | POA: Diagnosis not present

## 2018-10-28 DIAGNOSIS — S0990XA Unspecified injury of head, initial encounter: Secondary | ICD-10-CM | POA: Diagnosis not present

## 2018-10-28 LAB — URINALYSIS, ROUTINE W REFLEX MICROSCOPIC
BILIRUBIN URINE: NEGATIVE
Bacteria, UA: NONE SEEN
Glucose, UA: NEGATIVE mg/dL
Hgb urine dipstick: NEGATIVE
Ketones, ur: NEGATIVE mg/dL
Nitrite: NEGATIVE
Protein, ur: NEGATIVE mg/dL
Specific Gravity, Urine: 1.018 (ref 1.005–1.030)
pH: 6 (ref 5.0–8.0)

## 2018-10-28 MED ORDER — POTASSIUM CHLORIDE CRYS ER 20 MEQ PO TBCR: 20.0000 meq | EXTENDED_RELEASE_TABLET | Freq: Two times a day (BID) | ORAL | 0 refills | Status: DC

## 2018-10-28 MED ORDER — POTASSIUM CHLORIDE 10 MEQ/100ML IV SOLN
10.0000 meq | INTRAVENOUS | Status: DC
  Administered 2018-10-28: 10 meq via INTRAVENOUS
  Filled 2018-10-28 (×2): qty 100

## 2018-10-28 MED ORDER — IOHEXOL 300 MG/ML  SOLN
100.0000 mL | Freq: Once | INTRAMUSCULAR | Status: AC | PRN
  Administered 2018-10-28: 100 mL via INTRAVENOUS

## 2018-10-28 MED ORDER — ONDANSETRON 8 MG PO TBDP: ORAL_TABLET | ORAL | 0 refills | Status: DC

## 2018-10-28 MED ORDER — POTASSIUM CHLORIDE CRYS ER 20 MEQ PO TBCR
60.0000 meq | EXTENDED_RELEASE_TABLET | Freq: Once | ORAL | Status: AC
  Administered 2018-10-28: 60 meq via ORAL
  Filled 2018-10-28: qty 3

## 2018-10-28 MED ORDER — SODIUM CHLORIDE 0.9 % IV BOLUS
1000.0000 mL | Freq: Once | INTRAVENOUS | Status: AC
  Administered 2018-10-28: 1000 mL via INTRAVENOUS

## 2018-10-28 MED ORDER — SUCRALFATE 1 GM/10ML PO SUSP: 1.0000 g | Freq: Three times a day (TID) | ORAL | 0 refills | Status: DC

## 2018-10-28 MED ORDER — ONDANSETRON HCL 4 MG/2ML IJ SOLN
4.0000 mg | Freq: Once | INTRAMUSCULAR | Status: AC
  Administered 2018-10-28: 4 mg via INTRAVENOUS
  Filled 2018-10-28: qty 2

## 2018-10-28 MED ORDER — FOSFOMYCIN TROMETHAMINE 3 G PO PACK
3.0000 g | PACK | Freq: Once | ORAL | Status: AC
  Administered 2018-10-28: 3 g via ORAL
  Filled 2018-10-28: qty 3

## 2018-10-28 MED ORDER — ALUM & MAG HYDROXIDE-SIMETH 200-200-20 MG/5ML PO SUSP
30.0000 mL | Freq: Once | ORAL | Status: AC
  Administered 2018-10-28: 30 mL via ORAL
  Filled 2018-10-28: qty 30

## 2018-10-28 NOTE — ED Notes (Signed)
Patient transported to CT 

## 2018-10-28 NOTE — ED Notes (Signed)
Returned from CT.

## 2018-10-28 NOTE — Telephone Encounter (Signed)
Received [place] [employer] DMV Physical Condition Form, most conditions treated by Neurology; forwarded to provider/SLS 03/06  Still in provider's folder; forwarded to provider/SLS 03/10

## 2018-10-28 NOTE — ED Provider Notes (Signed)
[facility] EMERGENCY DEPARTMENT Provider Note   CSN: [id number] Arrival date & time: 10/27/18  1826    History   Chief Complaint Chief Complaint  Patient presents with  . Near Syncope  . Abdominal Pain    HPI Catherine Hubbard is a 58 y.o. female.     The history is provided by the patient.  Near Syncope  This is a new problem. The current episode started more than 2 days ago. The problem occurs rarely. The problem has been resolved. Associated symptoms include abdominal pain. Pertinent negatives include no chest pain, no headaches and no shortness of breath. Nothing aggravates the symptoms. Nothing relieves the symptoms. She has tried nothing for the symptoms. The treatment provided significant relief.  Abdominal Pain  Pain location:  Generalized Pain quality: cramping   Pain radiates to:  Does not radiate Pain severity:  Moderate Onset quality:  Gradual Duration:  5 days (since her EGD) Timing:  Constant Progression:  Unchanged Chronicity:  Recurrent Context: not alcohol use, not sick contacts, not suspicious food intake and not trauma   Relieved by:  Nothing Worsened by:  Nothing Ineffective treatments:  None tried Associated symptoms: nausea and vomiting   Associated symptoms: no anorexia, no belching, no chest pain, no chills, no constipation, no cough, no diarrhea, no dysuria, no fatigue, no fever, no flatus, no hematemesis, no hematochezia, no hematuria, no melena, no shortness of breath, no sore throat, no vaginal bleeding and no vaginal discharge   Risk factors: no alcohol abuse   Patient with MS and known esophagitis presents with nausea and vomiting and abdominal pain since her EGD.  Some foods and liquids are staying down but others are not.  No f/c/r.  No diarrhea nor constipation.  No urinary complaints    Past Medical History:  Diagnosis Date  . Asthma   . Esophagitis   . GERD (gastroesophageal reflux disease)   . Headache   .  Hearing loss   . Hypertension   . Multiple sclerosis (HCC)   . Vision abnormalities     Patient Active Problem List   Diagnosis Date Noted  . Pneumonia 09/26/2018  . Elevated troponin 09/26/2018  . Acute hypokalemia 09/25/2018  . Arthritis of carpometacarpal joint 03/27/2018  . Nocturnal leg cramps 03/26/2018  . Syncope due to orthostatic hypotension 02/12/2018  . Diarrhea 02/08/2018  . Acute lower UTI 02/08/2018  . Hematoma of scalp 02/08/2018  . Neck pain 01/20/2018  . Foreign body in esophagus   . Esophageal stricture   . Intractable nausea and vomiting 01/31/2017  . Dysphagia   . Severe protein-calorie malnutrition (HCC)   . Nausea & vomiting 01/30/2017  . Hypokalemia 01/30/2017  . Acute kidney injury (HCC) 01/30/2017  . Cystitis 01/30/2017  . Costochondritis 05/11/2016  . Trochanteric bursitis of both hips 03/28/2016  . Upper back pain 03/28/2016  . Bilateral low back pain with bilateral sciatica 07/13/2015  . Right hip pain 07/13/2015  . Lumbar radicular pain 03/21/2015  . Other fatigue 01/11/2015  . Dysesthesia 01/11/2015  . Urinary urgency 01/11/2015  . Multiple sclerosis (HCC) 10/06/2014  . Numbness 10/06/2014  . Ataxic gait 10/06/2014  . High risk medication use 10/06/2014  . Gastric bypass status for obesity 10/06/2014  . Cognitive changes 10/06/2014  . Depression with anxiety 10/06/2014  . Restless leg 10/06/2014  . Insomnia 10/06/2014  . Essential hypertension 10/06/2014  . Difficulty hearing 01/13/2014  . Other muscle spasm 01/13/2014    Past Surgical History:  Procedure Laterality Date  . ANKLE FRACTURE SURGERY Bilateral   . CARPAL TUNNEL RELEASE Bilateral   . COLONOSCOPY  11/2016   multiple  . ESOPHAGOGASTRODUODENOSCOPY  11/2016  . ESOPHAGOGASTRODUODENOSCOPY N/A 02/01/2017   Procedure: ESOPHAGOGASTRODUODENOSCOPY (EGD);  Surgeon: Iva Boop, MD;  Location: [facility] ENDOSCOPY;  Service: Endoscopy;  Laterality: N/A;  . ESOPHAGOGASTRODUODENOSCOPY  (EGD) WITH PROPOFOL N/A 02/04/2017   Procedure: ESOPHAGOGASTRODUODENOSCOPY (EGD) WITH PROPOFOL;  Surgeon: Meryl Dare, MD;  Location: [facility] ENDOSCOPY;  Service: Endoscopy;  Laterality: N/A;  . LAPAROSCOPIC GASTRIC SLEEVE RESECTION  2011  . ULNAR NERVE REPAIR Bilateral      OB History   No obstetric history on file.      Home Medications    Prior to Admission medications   Medication Sig Start Date End Date Taking? Authorizing Provider  baclofen (LIORESAL) 10 MG tablet Take 1 tablet (10 mg total) by mouth 3 (three) times daily. Patient taking differently: Take 10 mg by mouth at bedtime.  08/26/18  Yes Sater, Pearletha Furl, MD  chlorthalidone (HYGROTON) 25 MG tablet Take 25 mg by mouth daily.   Yes [provider]  DULoxetine (CYMBALTA) 60 MG capsule Take 1 capsule (60 mg total) by mouth daily. 08/26/18  Yes Sater, Pearletha Furl, MD  modafinil (PROVIGIL) 200 MG tablet Take 1 tablet (200 mg total) by mouth daily. Patient taking differently: Take 200 mg by mouth daily as needed (tired).  09/22/18  Yes Sater, Pearletha Furl, MD  Multiple Vitamin (MULTIVITAMIN WITH MINERALS) TABS tablet Take 1 tablet by mouth daily.   Yes [provider]  ocrelizumab 600 mg in sodium chloride 0.9 % 500 mL Inject 600 mg into the vein every 6 (six) months. Last infusion May 2019 - due in December 2019   Yes [provider]  omeprazole (PRILOSEC) 20 MG capsule Take 40 mg by mouth 2 (two) times daily.   Yes [provider]  oxybutynin (DITROPAN) 5 MG tablet Take 1 tablet (5 mg total) by mouth 2 (two) times daily. 08/26/18  Yes Sater, Pearletha Furl, MD    Family History Family History  Problem Relation Age of Onset  . Cancer Mother   . Stroke Mother   . Cancer Father     Social History Social History   Tobacco Use  . Smoking status: Never Smoker  . Smokeless tobacco: Never Used  . Tobacco comment: second hand smoker when young. Heavy exposure.  Substance Use Topics  . Alcohol use: Yes     Alcohol/week: 0.0 standard drinks    Comment: Sts. she drinks beer, liquor daily, varying amts/fim  . Drug use: No     Allergies   Ibuprofen and Nsaids   Review of Systems Review of Systems  Constitutional: Negative for chills, fatigue and fever.  HENT: Negative for sore throat.   Respiratory: Negative for cough and shortness of breath.   Cardiovascular: Positive for near-syncope. Negative for chest pain, palpitations and leg swelling.  Gastrointestinal: Positive for abdominal pain, nausea and vomiting. Negative for anorexia, constipation, diarrhea, flatus, hematemesis, hematochezia and melena.  Genitourinary: Negative for dysuria, flank pain, frequency, hematuria, pelvic pain, urgency, vaginal bleeding and vaginal discharge.  Neurological: Negative for headaches.  All other systems reviewed and are negative.    Physical Exam Updated Vital Signs BP (!) 140/104   Pulse 90   Temp 97.6 F (36.4 C) (Oral)   Resp 11   LMP 08/13/2014 Comment: very few periods  SpO2 97%   Physical Exam Vitals signs and  nursing note reviewed.  Constitutional:      General: She is not in acute distress.    Appearance: Normal appearance. She is normal weight.  HENT:     Head: Normocephalic and atraumatic.     Nose: Nose normal.     Mouth/Throat:     Mouth: Mucous membranes are moist.  Eyes:     Conjunctiva/sclera: Conjunctivae normal.     Pupils: Pupils are equal, round, and reactive to light.  Neck:     Musculoskeletal: Normal range of motion and neck supple.  Cardiovascular:     Rate and Rhythm: Normal rate and regular rhythm.     Pulses: Normal pulses.     Heart sounds: Normal heart sounds.  Pulmonary:     Effort: Pulmonary effort is normal. No respiratory distress.     Breath sounds: Normal breath sounds. No stridor. No wheezing, rhonchi or rales.  Abdominal:     General: Abdomen is flat. Bowel sounds are normal. There is no distension.     Palpations: Abdomen is soft. There is no  mass.     Tenderness: There is no abdominal tenderness. There is no guarding or rebound.     Hernia: No hernia is present.  Musculoskeletal: Normal range of motion.        General: No tenderness or deformity.     Right lower leg: No edema.     Left lower leg: No edema.  Skin:    General: Skin is warm and dry.     Capillary Refill: Capillary refill takes less than 2 seconds.  Neurological:     General: No focal deficit present.     Mental Status: She is alert and oriented to person, place, and time.     Deep Tendon Reflexes: Reflexes normal.  Psychiatric:        Mood and Affect: Mood normal.        Behavior: Behavior normal.      ED Treatments / Results  Labs (all labs ordered are listed, but only abnormal results are displayed) Results for orders placed or performed during the hospital encounter of 10/27/18  Lipase, blood  Result Value Ref Range   Lipase 60 (H) 11 - 51 U/L  Comprehensive metabolic panel  Result Value Ref Range   Sodium 129 (L) 135 - 145 mmol/L   Potassium 2.9 (L) 3.5 - 5.1 mmol/L   Chloride 85 (L) 98 - 111 mmol/L   CO2 23 22 - 32 mmol/L   Glucose, Bld 171 (H) 70 - 99 mg/dL   BUN 18 6 - 20 mg/dL   Creatinine, Ser 1.61 0.44 - 1.00 mg/dL   Calcium 9.4 8.9 - 09.6 mg/dL   Total Protein 6.8 6.5 - 8.1 g/dL   Albumin 3.6 3.5 - 5.0 g/dL   AST 045 (H) 15 - 41 U/L   ALT 55 (H) 0 - 44 U/L   Alkaline Phosphatase 85 38 - 126 U/L   Total Bilirubin 4.7 (H) 0.3 - 1.2 mg/dL   GFR calc non Af Amer >60 >60 mL/min   GFR calc Af Amer >60 >60 mL/min   Anion gap 21 (H) 5 - 15  CBC  Result Value Ref Range   WBC 6.3 4.0 - 10.5 K/uL   RBC 4.27 3.87 - 5.11 MIL/uL   Hemoglobin 14.3 12.0 - 15.0 g/dL   HCT 40.9 81.1 - 91.4 %   MCV 96.5 80.0 - 100.0 fL   MCH 33.5 26.0 - 34.0 pg   MCHC  34.7 30.0 - 36.0 g/dL   RDW 16.112.7 09.611.5 - 04.515.5 %   Platelets 232 150 - 400 K/uL   nRBC 0.0 0.0 - 0.2 %  Urinalysis, Routine w reflex microscopic  Result Value Ref Range   Color, Urine AMBER  (A) YELLOW   APPearance HAZY (A) CLEAR   Specific Gravity, Urine 1.018 1.005 - 1.030   pH 6.0 5.0 - 8.0   Glucose, UA NEGATIVE NEGATIVE mg/dL   Hgb urine dipstick NEGATIVE NEGATIVE   Bilirubin Urine NEGATIVE NEGATIVE   Ketones, ur NEGATIVE NEGATIVE mg/dL   Protein, ur NEGATIVE NEGATIVE mg/dL   Nitrite NEGATIVE NEGATIVE   Leukocytes,Ua TRACE (A) NEGATIVE   RBC / HPF 0-5 0 - 5 RBC/hpf   WBC, UA 21-50 0 - 5 WBC/hpf   Bacteria, UA NONE SEEN NONE SEEN   Squamous Epithelial / LPF 6-10 0 - 5   Mucus PRESENT    Hyaline Casts, UA PRESENT   I-Stat beta hCG blood, ED  Result Value Ref Range   I-stat hCG, quantitative <5.0 <5 mIU/mL   Comment 3           Dg Chest 2 View  Result Date: 10/13/2018 CLINICAL DATA:  Elevated BNP, edema EXAM: CHEST - 2 VIEW COMPARISON:  09/25/2018 FINDINGS: The heart size and mediastinal contours are within normal limits. Both lungs are clear. No acute osseous abnormality. Old right posterior rib fractures. IMPRESSION: No active cardiopulmonary disease. Electronically Signed   By: Elige KoHetal  Patel   On: 10/13/2018 11:18   Ct Head Wo Contrast  Result Date: 10/28/2018 CLINICAL DATA:  Fall, vision changes. History of multiple sclerosis. EXAM: CT HEAD WITHOUT CONTRAST TECHNIQUE: Contiguous axial images were obtained from the base of the skull through the vertex without intravenous contrast. COMPARISON:  CT HEAD August 24, 2018 and MRI head August 04, 2017 FINDINGS: BRAIN: No intraparenchymal hemorrhage, mass effect nor midline shift. Mild-to-moderate parenchymal brain volume loss. No hydrocephalus. Confluent supratentorial white matter hypodensities. VASCULAR: Trace calcific atherosclerosis. SKULL/SOFT TISSUES: No skull fracture. Small LEFT parietal scalp hematoma without subcutaneous gas or radiopaque foreign bodies. ORBITS/SINUSES: The included ocular globes and orbital contents are normal.Mild paranasal sinus mucosal thickening. Mastoid air cells are well aerated. OTHER: None.  IMPRESSION: 1. No acute intracranial process.  Small LEFT scalp hematoma. 2. Similar white matter changes previously characterized as chronic demyelination. 3. Stable mild-to-moderate parenchymal brain volume loss. Electronically Signed   By: Awilda Metroourtnay  Bloomer M.D.   On: 10/28/2018 05:57   Ct Abdomen Pelvis W Contrast  Result Date: 10/28/2018 CLINICAL DATA:  Abdominal pain with nausea and vomiting EXAM: CT ABDOMEN AND PELVIS WITH CONTRAST TECHNIQUE: Multidetector CT imaging of the abdomen and pelvis was performed using the standard protocol following bolus administration of intravenous contrast. CONTRAST:  100mL OMNIPAQUE 300 COMPARISON:  12/14/2017 FINDINGS: Lower chest: Inadvertent scanning of the entire chest was performed. The lungs are well aerated bilaterally. No focal infiltrate or sizable effusion is seen. No nodules are noted. Thoracic inlet demonstrates tiny hypodensity within the right lobe of the thyroid. No vascular abnormality is seen. Hepatobiliary: Fatty infiltration of the liver is noted. The gallbladder has been surgically removed. Pancreas: Unremarkable. No pancreatic ductal dilatation or surrounding inflammatory changes. Spleen: Normal in size without focal abnormality. Adrenals/Urinary Tract: Adrenal glands are within normal limits. Kidneys demonstrate a normal enhancement pattern bilaterally. No renal calculi or urinary tract obstructive changes are noted. Bladder is well distended. Stomach/Bowel: Mild diverticular change of the colon is noted without evidence of diverticulitis.  The appendix is within normal limits. No obstructive or inflammatory changes of the bowel are seen. Changes of prior gastric sleeve surgery are noted. Mild sliding-type hiatal hernia is noted. Vascular/Lymphatic: No significant vascular findings are present. No enlarged abdominal or pelvic lymph nodes. Reproductive: Uterus is within normal limits. Bilateral ovarian cystic change is noted and stable. The largest of  these lies on the right measuring 3.6 cm. Other: No free fluid is noted. Mild fat containing umbilical hernia is again noted and stable. Musculoskeletal: Degenerative changes of lumbar spine are seen. No acute bony abnormality is noted. Chronic compression deformity of L1 is noted. Old pubic rami fractures on the left are noted. IMPRESSION: Chronic changes as described above without acute abnormality. Electronically Signed   By: Alcide Clever M.D.   On: 10/28/2018 06:19    EKG None  Radiology Ct Head Wo Contrast  Result Date: 10/28/2018 CLINICAL DATA:  Fall, vision changes. History of multiple sclerosis. EXAM: CT HEAD WITHOUT CONTRAST TECHNIQUE: Contiguous axial images were obtained from the base of the skull through the vertex without intravenous contrast. COMPARISON:  CT HEAD August 24, 2018 and MRI head August 04, 2017 FINDINGS: BRAIN: No intraparenchymal hemorrhage, mass effect nor midline shift. Mild-to-moderate parenchymal brain volume loss. No hydrocephalus. Confluent supratentorial white matter hypodensities. VASCULAR: Trace calcific atherosclerosis. SKULL/SOFT TISSUES: No skull fracture. Small LEFT parietal scalp hematoma without subcutaneous gas or radiopaque foreign bodies. ORBITS/SINUSES: The included ocular globes and orbital contents are normal.Mild paranasal sinus mucosal thickening. Mastoid air cells are well aerated. OTHER: None. IMPRESSION: 1. No acute intracranial process.  Small LEFT scalp hematoma. 2. Similar white matter changes previously characterized as chronic demyelination. 3. Stable mild-to-moderate parenchymal brain volume loss. Electronically Signed   By: Awilda Metro M.D.   On: 10/28/2018 05:57   Ct Abdomen Pelvis W Contrast  Result Date: 10/28/2018 CLINICAL DATA:  Abdominal pain with nausea and vomiting EXAM: CT ABDOMEN AND PELVIS WITH CONTRAST TECHNIQUE: Multidetector CT imaging of the abdomen and pelvis was performed using the standard protocol following bolus  administration of intravenous contrast. CONTRAST:  OMNIPAQUE 300 COMPARISON:  12/14/2017 FINDINGS: Lower chest: Inadvertent scanning of the entire chest was performed. The lungs are well aerated bilaterally. No focal infiltrate or sizable effusion is seen. No nodules are noted. Thoracic inlet demonstrates tiny hypodensity within the right lobe of the thyroid. No vascular abnormality is seen. Hepatobiliary: Fatty infiltration of the liver is noted. The gallbladder has been surgically removed. Pancreas: Unremarkable. No pancreatic ductal dilatation or surrounding inflammatory changes. Spleen: Normal in size without focal abnormality. Adrenals/Urinary Tract: Adrenal glands are within normal limits. Kidneys demonstrate a normal enhancement pattern bilaterally. No renal calculi or urinary tract obstructive changes are noted. Bladder is well distended. Stomach/Bowel: Mild diverticular change of the colon is noted without evidence of diverticulitis. The appendix is within normal limits. No obstructive or inflammatory changes of the bowel are seen. Changes of prior gastric sleeve surgery are noted. Mild sliding-type hiatal hernia is noted. Vascular/Lymphatic: No significant vascular findings are present. No enlarged abdominal or pelvic lymph nodes. Reproductive: Uterus is within normal limits. Bilateral ovarian cystic change is noted and stable. The largest of these lies on the right measuring 3.6 cm. Other: No free fluid is noted. Mild fat containing umbilical hernia is again noted and stable. Musculoskeletal: Degenerative changes of lumbar spine are seen. No acute bony abnormality is noted. Chronic compression deformity of L1 is noted. Old pubic rami fractures on the left are noted. IMPRESSION: Chronic  changes as described above without acute abnormality. Electronically Signed   By: Alcide Clever M.D.   On: 10/28/2018 06:19    Procedures Procedures (including critical care time)  Medications Ordered in  ED Medications  potassium chloride SA (K-DUR,KLOR-CON) CR tablet 60 mEq (has no administration in time range)  fosfomycin (MONUROL) packet 3 g (has no administration in time range)  sodium chloride flush (NS) 0.9 % injection 3 mL (3 mLs Intravenous Given 10/28/18 0529)  ondansetron (ZOFRAN) injection 4 mg (4 mg Intravenous Given 10/28/18 0529)  alum & mag hydroxide-simeth (MAALOX/MYLANTA) 200-200-20 MG/5ML suspension 30 mL (30 mLs Oral Given 10/28/18 0531)  sodium chloride 0.9 % bolus 1,000 mL (1,000 mLs Intravenous New Bag/Given 10/28/18 0529)  iohexol (OMNIPAQUE) 300 MG/ML solution 100 mL (100 mLs Intravenous Contrast Given 10/28/18 0553)      PO challenged successfully in the ED>  Have treated for UTI, though the patient has no symptoms.  Patient will be started on carafate and zofran and referred back to the GI physician who did her EGD on Thursday.  Will also start potassium supplementation.  No trauma from her fall.   Stable for discharge with follow up    Final Clinical Impressions(s) / ED Diagnoses   Return for pain, intractable cough, fevers >100.4 unrelieved by medication, shortness of breath, intractable vomiting, chest pain, shortness of breath, weakness numbness, changes in speech, facial asymmetry,abdominal pain, passing out,Inability to tolerate liquids or food, cough, altered mental status or any concerns. No signs of systemic illness or infection. The patient is nontoxic-appearing on exam and vital signs are within normal limits.   I have reviewed the triage vital signs and the nursing notes. Pertinent labs &imaging results that were available during my care of the patient were reviewed by me and considered in my medical decision making (see chart for details).  After history, exam, and medical workup I feel the patient has been appropriately medically screened and is safe for discharge home. Pertinent diagnoses were discussed with the patient. Patient was given return  precautions.   Laila Myhre, MD 10/28/18 [phone]

## 2018-10-28 NOTE — Progress Notes (Signed)
[facility]  PATIENT: Catherine Hubbard DOB: 03/04/1961   _________________________________   HISTORICAL  CHIEF COMPLAINT:  Chief Complaint  Patient presents with  . Follow-up    RM 11, alone. Here for work in appt. [facility] last night. Was told to f/u with GI doctor. Had CT scan abdomen/head that looked ok per pt.  . other    Having intermittent dizziness, legs are weak. She has fallen and hit her head but she is ok. Having tremors. Having facial numbness, intermittent.     HISTORY OF PRESENT ILLNESS:  Catherine MinersMary Hubbard is a 58 y.o. woman with MS.    Update 10/28/2018: Yesterday, she went to the kitchen after getting up.  While walking back, she was veering and felt unsteady.   She went back to bed.  A little later she got up and while walking back from her shower, she had a rotational vertigo and she fell.     She hit her head and went to the ED.   There was no LOC before or with the fall.  CT showed a left parietal hematoma and stable volume loss and white matter changes form MS.     She has shooting pains in her face and numbness in her hands and feet.    No change in bladder and occasional urge incontinence.   She has had stomach pain since an EGD and also had a CT abdomen showing chronic DJD, chronic L1 compression deformity and old pubic rami fracture.  No free fluid or acute change.   She has had gastric sleeve surgery.      In January, copper was low normal at 72.     Update 08/26/2018: Her MS seems mostly stable.   She just had her Ocrevus infusion yesterday.   She tolerated it well and feels better since the infusion.    A few days ago, she fell reaching for her Ipad ona table and hit her forehead requiring stitches.   She still reports pain there.   No LOC.    She has not had any recent episode of syncope (last one summer 2019).  Her gait has done worse the last year, requiring her cane more.  She has occasional stumbles and  rare falls.   Balance is off.   Her legs seem mildly weak, maybe right mildly worse.   She has numbness in her legs.    She notes more edema in her legs.   She is trying to cut back on salt and she sometimes wears compression stockings.  She has urinary frequency with urge incontinence.   Some fecal incontinence.  She has a recent UTI.     Update 03/27/2018: She had syncope 2 months ago.  She was looking out the window and then woke up on the floor with her boss looking over her.    She reportedly hit her head on her way down.   She is not sure she lost consciousness but others toild her she did for just a few seconds and woke back up as soon as someone touched her head.    She also tripped 03/02/2018 and broke 2 ribs on her right when she slipped on 'goose crap'.    [facility] was [facility] and was admitted.   Of note, she had GI issues and was dehydrated and also had a UTI, hyponatemia, hypokalemia and mild hypoglycemia.     She is trying to  do better with fluids.     Echo showed LVH and grade 1 diastolic dysfunction.  She feels her MS is mostly stable.   She is on Ocrevus (last one June, next one December 2019).   She stopped many of the symptomatic medications   Gait is ok.   She wears flats as low heels may have increased fall risk.  She reports fatigue but feels better than she did a couple months ago.  She is sleeping ok most nights.   She notes urinary urgency but not bad enough to take a medication for it.   Update 01/20/2018: She has been on ocrelizumab and just had her second dose of the medication.   She has tolerated it well and has not had any exacerbations.    She feels stable for the most part but has had more falls, usually due to stumbling or tripping.   Gait does not seem that different to her.    She denies any change in strength or sensation.  She is noting urinary frequency with some burning and is concerned about a urinary tract infection.  She has had these in the past.  She gets  LBP helped by muscle relaxants.  The pain is midline and radiates to the proximal legs.  She also has pain in her shoulders and arms.   She notes spasms in hr neck.       She has esophagitis and is supposed to have a stretch procedure soon.      She also has a h/o gastric bypass  Update 07/22/2017:   She had her first dose of ocrelizumab in November. She tolerated it well and there were no complications. Her next infusion will be at the end of April. For the most part, she feels the MS has been stable. However, she did note some word finding difficulties last week. This is better this week.    She felt mildly stronger since the infusion.   She feels off balanced, especially if she turns fast or someone bumps into her.   She needs to hold on in the shower.  Her right leg is weaker than her left and sometimes has a foot drop, esp if walking longer distances.     She is doing Geophysicist/field seismologist yoga.   She notes spasticity in legs at night, helped by valium.    Late last month,  she noted a few days where she had trouble coming up with the right word.   She feels she is doing better than last week ans almost back to baseline.    Her bladder is about the same.     Vision is mostly stable.   She lost her prescription glasses and uses readers to read.    She notes pain, mostly in the mid back.   Yoga has not helped the pain much.    She notes sometimes bruising more easily, especially after scratching.    Mood is doing ok, helped by duloxetine and Wellbutrin.   Drinking is better.       From 10/09/2016:  MS:   She is on Rebif and tolerates it well.    However, her last injection was 08/17/2016 and she has not yet gotten refill.  She denies any definite exacerbations but she feels balance, footdrop and writing are worse.   Gait/strength/sensation:   She notes a right foot drop and over last year the left leg is also bothering her more.   She reports poor balance .  She does not need a cane.  She has  spasticity in both legs, worse on the right and she sometimes gets spasms that are worse at night.      Sometimes she feels like she is being pushed to the right as she walks.   She has tingling in her hands and face.  Baclofen has helped spasticity some.   Cyclobenzaprine has not helped spasms.     Bladder:   She reports urinary frequency and occasional incontinence.   She feels bladder function is worse with recent bladder infection.   stable.,  She was placed on Cephalexin and then switched to doxycycline.   She felt she did better at first on cephalexin but then did worse again.   Cx and Sensitivity shows E. Coli resistance to cephalosporines (also to Bactrim and Cipro) and she is sensitive to tetracycline and nitrofurantoin and gentamicin and meropenem.       Vision:swallow:   She feels this is stable.   She has diplopia laying on either side but not when sitting up.   Also some she reads a while or when very tired.     Dysphagia:   She notes dysphagia, especially with 'sticky' pills.   With EGD, she had some capsules stucjk in esophagus and some med's were discontinued.    Strictures are being stretched.    Mood/EtOH/Cognition:   Cymbalta was stopped and she was placed on Wellbutrin and Prozac but is doing worse.   The switch was made due to difficulty swallowing.    She still sometimes feels moderately depressed.     She has some apathy.   She drinks less alcohol than last year   She has mild difficulty with cognition, mostly occasional difficulty with short-term memory and verbal fluency (often gets syllables reversed).  She has trouble completing complicated tasks.  .  Fatigue:   She has fatigue and takes Provigil but it was stopped.  She was tried on Ritalin or Adderall once but she did not like the way that she felt when she took it.    Sleep:   She is sleeping poorly due to more pain.   She has some RLS but has not taken anything in maonths for it.   She notes difficulty falling asleep more  than with sleep maintenance.   MS History:   She was diagnosed with multiple sclerosis in 2009. Before that time, she had noted some gait ataxia but had not had any imaging studies. In 2009, due to progressive hearing loss, she had an MRI of the brain and  then had a lumbar puncture that also confirmed the diagnosis.  I have some of her early MRI reports there are several foci noted in the cervical and thoracic spinal cord in the brain, there were multiple hyperintense foci, mostly in the periventricular deep white matter many of these were contrast enhancing on 07/26/2008. There was another enhancing lesion adjacent to C2 in the spinal cord.  Initially, she was placed on Avonex. She did not feel good and she stopped. She then tried Copaxone but also stopped after a while. She moved here in 2014 and saw Dr. Renne Crigler at [facility]. She was placed on Rebif. She tolerates Rebif well.      MRI reports from 12/04/2012  showed multiple foci in the spinal cord at C2, C3-C4, and C4-C5. There were also foci at T4, T7, T9 and T10. The MRI of the brain showed 2 nonenhancing foci not present from studies  in 2011. She reports having an MRI at [facility] 2015 but we do not have those films.   REVIEW OF SYSTEMS:  Constitutional: No fevers, chills, sweats, or change in appetite.    She reports fatigue and insomnia Eyes: see above.  No eye pain  But some light sensitivity. Ear, nose and throat: No hearing loss, ear pain, nasal congestion, sore throat.   tinnitus Cardiovascular: No chest pain, palpitations Respiratory: No shortness of breath at rest or with exertion.   Some wheezes, once on asthma med's Gastrointestinal: No nausea, vomiting, diarrhea, abdominal pain, fecal incontinence.  She has esophagitis Genitourinary: see above Musculoskeletal: see above  She also notes neck pain Integumentary: No rash, pruritus, skin lesions Neurological: as above Psychiatric: as above Endocrine: No palpitations, diaphoresis,  change in appetite, change in weigh or increased thirst.  Often feels cold Hematologic/Lymphatic: No anemia, purpura, petechiae. Allergic/Immunologic: No itchy/runny eyes, nasal congestion, recent allergic reactions, rashes  ALLERGIES: Allergies  Allergen Reactions  . 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 Nsaids     Gastric bypass    HOME MEDICATIONS:  Current Outpatient Medications:  .  baclofen (LIORESAL) 10 MG tablet, Take 1 tablet (10 mg total) by mouth 3 (three) times daily. (Patient taking differently: Take 10 mg by mouth at bedtime. ), Disp: 90 each, Rfl: 05 .  chlorthalidone (HYGROTON) 25 MG tablet, Take 25 mg by mouth daily., Disp: , Rfl:  .  DULoxetine (CYMBALTA) 60 MG capsule, Take 1 capsule (60 mg total) by mouth daily., Disp: 30 capsule, Rfl: 11 .  modafinil (PROVIGIL) 200 MG tablet, Take 1 tablet (200 mg total) by mouth daily. (Patient taking differently: Take 200 mg by mouth daily as needed (tired). ), Disp: 30 tablet, Rfl: 0 .  Multiple Vitamin (MULTIVITAMIN WITH MINERALS) TABS tablet, Take 1 tablet by mouth daily., Disp: , Rfl:  .  ocrelizumab 600 mg in sodium chloride 0.9 % 500 mL, Inject 600 mg into the vein every 6 (six) months. Last infusion May 2019 - due in December 2019, Disp: , Rfl:  .  omeprazole (PRILOSEC) 20 MG capsule, Take 40 mg by mouth 2 (two) times daily., Disp: , Rfl:  .  ondansetron (ZOFRAN ODT) 8 MG disintegrating tablet,  ODT q8 hours prn nausea, Disp: 4 tablet, Rfl: 0 .  oxybutynin (DITROPAN) 5 MG tablet, Take 1 tablet (5 mg total) by mouth 2 (two) times daily., Disp: 60 tablet, Rfl: 5 .  potassium chloride SA (K-DUR,KLOR-CON) 20 MEQ tablet, Take 1 tablet (20 mEq total) by mouth 2 (two) times daily., Disp: 10 tablet, Rfl: 0 .  sucralfate (CARAFATE) 1 GM/10ML suspension, Take 10 mLs (1 g total) by mouth 4 (four) times daily -  with meals and at bedtime., Disp: 420 mL, Rfl: 0  PAST MEDICAL HISTORY: Past Medical  History:  Diagnosis Date  . Asthma   . Esophagitis   . GERD (gastroesophageal reflux disease)   . Headache   . Hearing loss   . Hypertension   . Multiple sclerosis (HCC)   . Vision abnormalities     PAST SURGICAL HISTORY: Past Surgical History:  Procedure Laterality Date  . ANKLE FRACTURE SURGERY Bilateral   . CARPAL TUNNEL RELEASE Bilateral   . COLONOSCOPY  11/2016   multiple  . ESOPHAGOGASTRODUODENOSCOPY  11/2016  . ESOPHAGOGASTRODUODENOSCOPY N/A 02/01/2017   Procedure: ESOPHAGOGASTRODUODENOSCOPY (EGD);  Surgeon: Iva Boop, MD;  Location: [facility] ENDOSCOPY;  Service: Endoscopy;  Laterality: N/A;  .  ESOPHAGOGASTRODUODENOSCOPY (EGD) WITH PROPOFOL N/A 02/04/2017   Procedure: ESOPHAGOGASTRODUODENOSCOPY (EGD) WITH PROPOFOL;  Surgeon: Meryl Dare, MD;  Location: [facility] ENDOSCOPY;  Service: Endoscopy;  Laterality: N/A;  . LAPAROSCOPIC GASTRIC SLEEVE RESECTION  2011  . ULNAR NERVE REPAIR Bilateral     FAMILY HISTORY: Family History  Problem Relation Age of Onset  . Cancer Mother   . Stroke Mother   . Cancer Father     SOCIAL HISTORY:  Social History   Socioeconomic History  . Marital status: Married    Spouse name: Not on file  . Number of children: Not on file  . Years of education: Not on file  . Highest education level: Not on file  Occupational History  . Not on file  Social Needs  . Financial resource strain: Not on file  . Food insecurity:    Worry: Not on file    Inability: Not on file  . Transportation needs:    Medical: Not on file    Non-medical: Not on file  Tobacco Use  . Smoking status: Never Smoker  . Smokeless tobacco: Never Used  . Tobacco comment: second hand smoker when young. Heavy exposure.  Substance and Sexual Activity  . Alcohol use: Yes    Alcohol/week: 0.0 standard drinks    Comment: Sts. she drinks beer, liquor daily, varying amts/fim  . Drug use: No  . Sexual activity: Never  Lifestyle  . Physical activity:    Days per week: Not  on file    Minutes per session: Not on file  . Stress: Not on file  Relationships  . Social connections:    Talks on phone: Not on file    Gets together: Not on file    Attends religious service: Not on file    Active member of club or organization: Not on file    Attends meetings of clubs or organizations: Not on file    Relationship status: Not on file  . Intimate partner violence:    Fear of current or ex partner: Not on file    Emotionally abused: Not on file    Physically abused: Not on file    Forced sexual activity: Not on file  Other Topics Concern  . Not on file  Social History Narrative  . Not on file     PHYSICAL EXAM  Vitals:   10/28/18 0856  BP: 125/70  Pulse: 86  Weight: 142 lb 8 oz (64.6 kg)  Height: 5\' 5"  (1.651 m)    Body mass index is 23.71 kg/m.   General: The patient is well-developed and well-nourished and in no acute distress.     Neck/HEENT: Small amount of wax in the ear canals.  Tympanic membranes are intact.  She has mild tenderness in the cervical paraspinal muscles.      Extremities: She has bruises on arms.   Mild pedal edema (less than last visit)  Neurologic Exam  Mental status: The patient is alert and oriented x 3 at the time of the examination. The patient has apparent normal recent and remote memory, with an apparently normal attention span and concentration ability.   Speech is normal.  Cranial nerves: Extraocular movements are full.  . She reports reduced sensation in the right face.  Facial strength was normal.  The trapezius strength is normal.    She has reduced hearing on the left and Weber lateralizes right. She wears hearing aids. .  Motor:  Muscle bulk is normal.   Tone is  normal.  Strength is 5/5 in the arms and legs.  Sensory: She reports reduced sensation to touch on the left .   Reduced vibration in toes.   Ok at ankles  Coordination: Cerebellar testing shows good finger-nose-finger.  Gait and station: Station is  normal.  The gait is mildly wide but the tandem gait is moderately wide.  Romberg is negative..  Reflexes: Deep tendon reflexes are symmetric and mildly increased in legs bilaterally.       DIAGNOSTIC DATA (LABS, IMAGING, TESTING) - I reviewed patient records, labs, notes, testing and imaging myself where available.     ASSESSMENT AND PLAN  Multiple sclerosis (HCC) - Plan: MR BRAIN W WO CONTRAST, MR CERVICAL SPINE W WO CONTRAST  Vertigo of central origin - Plan: MR BRAIN W WO CONTRAST, MR CERVICAL SPINE W WO CONTRAST     1.    For MS, continue Ocrevus.   We will check an MRI of the brain and cervical spine to determine if she has had subclinical progression of her MS. 2.    Continue baclofen but increase to tid and restart Cymbalta.. 3.    She has much more edema in her legs.. she should use compression stockingsdiscuss with her PCP. 4.    She will return to see me in 4 months or sooner if she has new or worsening neurologic symptoms.    A. Epimenio Foot, MD, PhD, FAAN Certified in Neurology, Clinical Neurophysiology, Sleep Medicine, Pain Medicine and Neuroimaging Director, [facility] at [facility]  [facility] [address], [address] [place], [place] [address] [phone]

## 2018-10-29 ENCOUNTER — Telehealth: Payer: Self-pay | Admitting: Neurology

## 2018-10-29 ENCOUNTER — Encounter (HOSPITAL_BASED_OUTPATIENT_CLINIC_OR_DEPARTMENT_OTHER): Payer: Self-pay | Admitting: Emergency Medicine

## 2018-10-29 ENCOUNTER — Emergency Department (HOSPITAL_BASED_OUTPATIENT_CLINIC_OR_DEPARTMENT_OTHER): Payer: Federal, State, Local not specified - PPO

## 2018-10-29 ENCOUNTER — Telehealth: Payer: Self-pay | Admitting: Medical

## 2018-10-29 ENCOUNTER — Other Ambulatory Visit: Payer: Self-pay

## 2018-10-29 ENCOUNTER — Emergency Department (HOSPITAL_BASED_OUTPATIENT_CLINIC_OR_DEPARTMENT_OTHER)
Admission: EM | Admit: 2018-10-29 | Discharge: 2018-10-29 | Disposition: A | Discharge status: Home / Self Care | Payer: Federal, State, Local not specified - PPO | Attending: Emergency Medicine | Admitting: Emergency Medicine

## 2018-10-29 DIAGNOSIS — R0602 Shortness of breath: Secondary | ICD-10-CM

## 2018-10-29 DIAGNOSIS — R42 Dizziness and giddiness: Secondary | ICD-10-CM | POA: Insufficient documentation

## 2018-10-29 DIAGNOSIS — Z79899 Other long term (current) drug therapy: Secondary | ICD-10-CM | POA: Diagnosis not present

## 2018-10-29 DIAGNOSIS — I10 Essential (primary) hypertension: Secondary | ICD-10-CM | POA: Diagnosis not present

## 2018-10-29 DIAGNOSIS — R531 Weakness: Secondary | ICD-10-CM | POA: Diagnosis not present

## 2018-10-29 DIAGNOSIS — J45909 Unspecified asthma, uncomplicated: Secondary | ICD-10-CM | POA: Diagnosis not present

## 2018-10-29 DIAGNOSIS — R55 Syncope and collapse: Secondary | ICD-10-CM | POA: Insufficient documentation

## 2018-10-29 DIAGNOSIS — H538 Other visual disturbances: Secondary | ICD-10-CM | POA: Diagnosis not present

## 2018-10-29 DIAGNOSIS — R9431 Abnormal electrocardiogram [ECG] [EKG]: Secondary | ICD-10-CM | POA: Diagnosis not present

## 2018-10-29 LAB — BASIC METABOLIC PANEL
Anion gap: 16 — ABNORMAL HIGH (ref 5–15)
BUN: 20 mg/dL (ref 6–20)
CHLORIDE: 85 mmol/L — AB (ref 98–111)
CO2: 26 mmol/L (ref 22–32)
Calcium: 9.3 mg/dL (ref 8.9–10.3)
Creatinine, Ser: 1.08 mg/dL — ABNORMAL HIGH (ref 0.44–1.00)
GFR calc Af Amer: >60 mL/min (ref >60)
GFR calc non Af Amer: 57 mL/min — ABNORMAL LOW (ref >60)
Glucose, Bld: 119 mg/dL — ABNORMAL HIGH (ref 70–99)
Potassium: 3.2 mmol/L — ABNORMAL LOW (ref 3.5–5.1)
Sodium: 127 mmol/L — ABNORMAL LOW (ref 135–145)

## 2018-10-29 LAB — URINALYSIS, ROUTINE W REFLEX MICROSCOPIC
Bilirubin Urine: NEGATIVE
Glucose, UA: NEGATIVE mg/dL
Hgb urine dipstick: NEGATIVE
Ketones, ur: 15 mg/dL — AB
Leukocytes,Ua: NEGATIVE
Nitrite: NEGATIVE
Protein, ur: NEGATIVE mg/dL
Specific Gravity, Urine: 1.01 (ref 1.005–1.030)
pH: 7.5 (ref 5.0–8.0)

## 2018-10-29 LAB — CBC WITH DIFFERENTIAL/PLATELET
Abs Immature Granulocytes: 0.04 10*3/uL (ref 0.00–0.07)
BASOS ABS: 0.1 10*3/uL (ref 0.0–0.1)
Basophils Relative: 1 %
Eosinophils Absolute: 0 10*3/uL (ref 0.0–0.5)
Eosinophils Relative: 0 %
HCT: 36.6 % (ref 36.0–46.0)
Hemoglobin: 12.1 g/dL (ref 12.0–15.0)
IMMATURE GRANULOCYTES: 1 %
LYMPHS ABS: 0.9 10*3/uL (ref 0.7–4.0)
LYMPHS PCT: 11 %
MCH: 33.2 pg (ref 26.0–34.0)
MCHC: 33.1 g/dL (ref 30.0–36.0)
MCV: 100.5 fL — ABNORMAL HIGH (ref 80.0–100.0)
MONOS PCT: 14 %
Monocytes Absolute: 1.2 10*3/uL — ABNORMAL HIGH (ref 0.1–1.0)
Neutro Abs: 6.1 10*3/uL (ref 1.7–7.7)
Neutrophils Relative %: 73 %
Platelets: 170 10*3/uL (ref 150–400)
RBC: 3.64 MIL/uL — ABNORMAL LOW (ref 3.87–5.11)
RDW: 12.9 % (ref 11.5–15.5)
WBC: 8.3 10*3/uL (ref 4.0–10.5)
nRBC: 0 % (ref 0.0–0.2)

## 2018-10-29 LAB — D-DIMER, QUANTITATIVE: D-Dimer, Quant: 0.36 ug/mL-FEU (ref 0.00–0.50)

## 2018-10-29 LAB — BRAIN NATRIURETIC PEPTIDE: B Natriuretic Peptide: 76.4 pg/mL (ref 0.0–100.0)

## 2018-10-29 LAB — TROPONIN I: Troponin I: <0.03 ng/mL (ref <0.03)

## 2018-10-29 LAB — PREGNANCY, URINE: Preg Test, Ur: NEGATIVE

## 2018-10-29 MED ORDER — SODIUM CHLORIDE 0.9 % IV BOLUS
1000.0000 mL | Freq: Once | INTRAVENOUS | Status: AC
  Administered 2018-10-29: 1000 mL via INTRAVENOUS

## 2018-10-29 MED ORDER — IPRATROPIUM-ALBUTEROL 0.5-2.5 (3) MG/3ML IN SOLN
3.0000 mL | Freq: Once | RESPIRATORY_TRACT | Status: AC
  Administered 2018-10-29: 3 mL via RESPIRATORY_TRACT
  Filled 2018-10-29: qty 3

## 2018-10-29 NOTE — Telephone Encounter (Signed)
lvm for pt to call back about scheduling mri  [employer] Auth: NPR Ref # [id number]

## 2018-10-29 NOTE — Telephone Encounter (Signed)
Pt called to inform us that she is now in the hospital with lightheadedness and trouble breathing.

## 2018-10-29 NOTE — Discharge Instructions (Signed)
You have been seen today for shortness of breath and dizziness. Please read and follow all provided instructions.   1. Medications: usual home medications 2. Treatment: rest, drink plenty of fluids 3. Follow Up: Please follow up with your neurologist. Please follow up with your primary doctor in 2 days for discussion of your diagnoses and further evaluation after today's visit; if you do not have a primary care doctor use the resource guide provided to find one; Please return to the ER for any new or worsening symptoms. Please obtain all of your results from medical records or have your doctors office obtain the results - share them with your doctor - you should be seen at your doctors office. Call today to arrange your follow up.   Take medications as prescribed. Please review all of the medicines and only take them if you do not have an allergy to them. Return to the emergency room for worsening condition or new concerning symptoms. Follow up with your regular doctor. If you don't have a regular doctor use one of the numbers below to establish a primary care doctor.  Please be aware that if you are taking birth control pills, taking other prescriptions, ESPECIALLY ANTIBIOTICS may make the birth control ineffective - if this is the case, either do not engage in sexual activity or use alternative methods of birth control such as condoms until you have finished the medicine and your family doctor says it is OK to restart them. If you are on a blood thinner such as COUMADIN, be aware that any other medicine that you take may cause the coumadin to either work too much, or not enough - you should have your coumadin level rechecked in next 7 days if this is the case.  ?  It is also a possibility that you have an allergic reaction to any of the medicines that you have been prescribed - Everybody reacts differently to medications and while MOST people have no trouble with most medicines, you may have a reaction  such as nausea, vomiting, rash, swelling, shortness of breath. If this is the case, please stop taking the medicine immediately and contact your physician.  ?  You should return to the ER if you develop severe or worsening symptoms.   Emergency Department Resource Guide 1) Find a Doctor and Pay Out of Pocket Although you won't have to find out who is covered by your insurance plan, it is a good idea to ask around and get recommendations. You will then need to call the office and see if the doctor you have chosen will accept you as a new patient and what types of options they offer for patients who are self-pay. Some doctors offer discounts or will set up payment plans for their patients who do not have insurance, but you will need to ask so you aren't surprised when you get to your appointment.  2) Contact Your Local Health Department Not all health departments have doctors that can see patients for sick visits, but many do, so it is worth a call to see if yours does. If you don't know where your local health department is, you can check in your phone book. The CDC also has a tool to help you locate your state's health department, and many state websites also have listings of all of their local health departments.  3) Find a Walk-in Clinic If your illness is not likely to be very severe or complicated, you may want to try a  walk in clinic. These are popping up all over the country in pharmacies, drugstores, and shopping centers. They're usually staffed by nurse practitioners or physician assistants that have been trained to treat common illnesses and complaints. They're usually fairly quick and inexpensive. However, if you have serious medical issues or chronic medical problems, these are probably not your best option.  No Primary Care Doctor: Call [facility] Connect at  [phone] - they can help you locate a primary care doctor that  accepts your insurance, provides certain services, etc. Physician  Referral [phone]  Emergency Department Resource Guide 1) Find a Doctor and Pay Out of Pocket Although you won't have to find out who is covered by your insurance plan, it is a good idea to ask around and get recommendations. You will then need to call the office and see if the doctor you have chosen will accept you as a new patient and what types of options they offer for patients who are self-pay. Some doctors offer discounts or will set up payment plans for their patients who do not have insurance, but you will need to ask so you aren't surprised when you get to your appointment.  2) Contact Your Local Health Department Not all health departments have doctors that can see patients for sick visits, but many do, so it is worth a call to see if yours does. If you don't know where your local health department is, you can check in your phone book. The CDC also has a tool to help you locate your state's health department, and many state websites also have listings of all of their local health departments.  3) Find a Walk-in Clinic If your illness is not likely to be very severe or complicated, you may want to try a walk in clinic. These are popping up all over the country in pharmacies, drugstores, and shopping centers. They're usually staffed by nurse practitioners or physician assistants that have been trained to treat common illnesses and complaints. They're usually fairly quick and inexpensive. However, if you have serious medical issues or chronic medical problems, these are probably not your best option.  No Primary Care Doctor: Call [facility] Connect at  [phone] - they can help you locate a primary care doctor that  accepts your insurance, provides certain services, etc. Physician Referral Service- [phone]  Chronic Pain Problems: Organization         Address  Phone   Notes  [facility]  [phone] Patients need to be referred by their primary care  doctor.   Medication Assistance: Organization         Address  Phone   Notes  [facility] Medication [facility] [address] [place] [place], [place] [address] [phone] --Must be a resident of [facility] -- Must have NO insurance coverage whatsoever (no Medicaid/ Medicare, etc.) -- The pt. MUST have a primary care doctor that directs their care regularly and follows them in the community   [facility]  [phone]   [employer]  [phone]    Agencies that provide inexpensive medical care: Organization         Address  Phone   Notes  [facility]  [phone]   [facility]    [phone]   [facility] [address] [place], [place] [address] [phone]   [facility] [address] [place], [place] [phone]   [facility]    [phone]   [facility]    [phone]   [facility]  [address], [place] Phone:  [phone], Fax:  [phone] Hours of Operation:  9 am - 6 pm, M-F.  Also accepts Medicaid/Medicare and self-pay.  [facility]  [address], [place] Phone: [phone], Fax: [phone]. Hours of Operation:  8:30 am - 5:30 pm, M-F.  Also accepts Medicaid and self-pay.  [facility] [place] [address] [place] Phone: [phone]   [facility] [address] Natasha Bence [place], [place] [phone], Ext. [phone] Mondays & Thursdays: 7-9 AM.  First 15 patients are seen on a first come, first serve basis.    Medicaid-accepting [facility] Providers:  Organization         Address  Phone   Notes  [facility] [address], [place] [phone] Also accepts self-pay patients.  [facility] [address] Laurell Josephs [place], [place]  [phone]   [facility] [address], [place] [phone]   [facility] [address] [place] [phone]   Renaye Rakers [address], [place]   [phone] Only accepts [place] patients after they have their name applied to their card.   Self-Pay (no insurance) in [facility]:  Organization         Address  Phone   Notes  Sickle Cell Patients, [facility] [address] [place], [place] [phone]   [facility] [address] [place], [place] [phone]   [facility] [place]  [address],  [phone]   [facility]/Dr. Osei-Bonsu  [address] [place] or [address], [place] [phone] Phone number for both [place] and [place] locations is the same.  [facility] [address], [place] [phone]   [facility] [address], [place] or [address] [phone]   [facility] [address], [place] [phone], phone; [phone], fax Sees patients 1st and 3rd Saturday of every month.  Must not qualify for public or private insurance (i.e. Medicaid, Medicare,  Health Choice, Veterans' Benefits)  Household income should be no more than 200% of the poverty level The clinic cannot treat you if you are pregnant or think you are pregnant  Sexually transmitted diseases are not treated at the clinic.

## 2018-10-29 NOTE — ED Triage Notes (Signed)
Shortness of breath since 1400 today, at work, pt breathing fast in triage. RT assessed, no wheezing. Able to speak full sentences.

## 2018-10-29 NOTE — Telephone Encounter (Signed)
Dr. Sater- FYI 

## 2018-10-29 NOTE — Telephone Encounter (Signed)
Patient's husband came into the office stating that the patient needs a document sent into the [facility] or they will suspend her license. Due March 15th 2020.  Patient is currently in the ED downstairs with tremors and trouble breathing.

## 2018-10-29 NOTE — ED Notes (Addendum)
PT STOOD AT THE SIDE OF HER BED WITHOUT DIFFICULTY. PA (ANA) NOTIFIED.

## 2018-10-29 NOTE — ED Provider Notes (Addendum)
[facility] EMERGENCY DEPARTMENT Provider Note   CSN: [id number] Arrival date & time: 10/29/18  1450  History   Chief Complaint Chief Complaint  Patient presents with   Shortness of Breath    HPI Catherine Hubbard is a 58 y.o. female with a PMH of Asthma, HTN, MS, and GERD presenting with intermittent shortness of breath and dizziness onset today at 2pm. Patient reports she lifted an item at work when symptoms began. Patient describes symptoms as lightheadedness and states she has diffuse weakness. Patient states she is not able to ambulate due to weakness. Patient reports vision changes and tremors. Patient reports near syncope, but denies LOC. Patient reports she fell 4 days ago and was evaluated in the ER. Patient denies chest pain, nausea, vomiting, diarrhea, or abdominal pain. Patient reports she sees Dr. Epimenio Foot by [facility] and was last seen yesterday. Patient denies cough, congestion, fever, sore throat, or rhinorrhea.     HPI  Past Medical History:  Diagnosis Date   Asthma    Esophagitis    GERD (gastroesophageal reflux disease)    Headache    Hearing loss    Hypertension    Multiple sclerosis (HCC)    Vision abnormalities     Patient Active Problem List   Diagnosis Date Noted   Pneumonia 09/26/2018   Elevated troponin 09/26/2018   Acute hypokalemia 09/25/2018   Arthritis of carpometacarpal joint 03/27/2018   Nocturnal leg cramps 03/26/2018   Syncope due to orthostatic hypotension 02/12/2018   Diarrhea 02/08/2018   Acute lower UTI 02/08/2018   Hematoma of scalp 02/08/2018   Neck pain 01/20/2018   Foreign body in esophagus    Esophageal stricture    Intractable nausea and vomiting 01/31/2017   Dysphagia    Severe protein-calorie malnutrition (HCC)    Nausea & vomiting 01/30/2017   Hypokalemia 01/30/2017   Acute kidney injury (HCC) 01/30/2017   Cystitis 01/30/2017   Costochondritis 05/11/2016     Trochanteric bursitis of both hips 03/28/2016   Upper back pain 03/28/2016   Bilateral low back pain with bilateral sciatica 07/13/2015   Right hip pain 07/13/2015   Lumbar radicular pain 03/21/2015   Other fatigue 01/11/2015   Dysesthesia 01/11/2015   Urinary urgency 01/11/2015   Multiple sclerosis (HCC) 10/06/2014   Numbness 10/06/2014   Ataxic gait 10/06/2014   High risk medication use 10/06/2014   Gastric bypass status for obesity 10/06/2014   Cognitive changes 10/06/2014   Depression with anxiety 10/06/2014   Restless leg 10/06/2014   Insomnia 10/06/2014   Essential hypertension 10/06/2014   Difficulty hearing 01/13/2014   Other muscle spasm 01/13/2014    Past Surgical History:  Procedure Laterality Date   ANKLE FRACTURE SURGERY Bilateral    CARPAL TUNNEL RELEASE Bilateral    COLONOSCOPY  11/2016   multiple   ESOPHAGOGASTRODUODENOSCOPY  11/2016   ESOPHAGOGASTRODUODENOSCOPY N/A 02/01/2017   Procedure: ESOPHAGOGASTRODUODENOSCOPY (EGD);  Surgeon: Iva Boop, MD;  Location: [facility] ENDOSCOPY;  Service: Endoscopy;  Laterality: N/A;   ESOPHAGOGASTRODUODENOSCOPY (EGD) WITH PROPOFOL N/A 02/04/2017   Procedure: ESOPHAGOGASTRODUODENOSCOPY (EGD) WITH PROPOFOL;  Surgeon: Meryl Dare, MD;  Location: [facility] ENDOSCOPY;  Service: Endoscopy;  Laterality: N/A;   LAPAROSCOPIC GASTRIC SLEEVE RESECTION  2011   ULNAR NERVE REPAIR Bilateral      OB History   No obstetric history on file.      Home Medications    Prior to Admission medications   Medication Sig Start Date End Date Taking? Authorizing Provider  baclofen (LIORESAL) 10 MG tablet Take 1 tablet (10 mg total) by mouth 3 (three) times daily. Patient taking differently: Take 10 mg by mouth at bedtime.  08/26/18   Sater, Pearletha Furl, MD  chlorthalidone (HYGROTON) 25 MG tablet Take 25 mg by mouth daily.    [provider]  DULoxetine (CYMBALTA) 60 MG capsule Take 1 capsule (60 mg total) by mouth  daily. 08/26/18   Sater, Pearletha Furl, MD  modafinil (PROVIGIL) 200 MG tablet Take 1 tablet (200 mg total) by mouth daily. Patient taking differently: Take 200 mg by mouth daily as needed (tired).  09/22/18   Sater, Pearletha Furl, MD  Multiple Vitamin (MULTIVITAMIN WITH MINERALS) TABS tablet Take 1 tablet by mouth daily.    [provider]  ocrelizumab 600 mg in sodium chloride 0.9 % 500 mL Inject 600 mg into the vein every 6 (six) months. Last infusion May 2019 - due in December 2019    [provider]  omeprazole (PRILOSEC) 20 MG capsule Take 40 mg by mouth 2 (two) times daily.    [provider]  ondansetron (ZOFRAN ODT) 8 MG disintegrating tablet 8mg  ODT q8 hours prn nausea 10/28/18   Palumbo, April, MD  oxybutynin (DITROPAN) 5 MG tablet Take 1 tablet (5 mg total) by mouth 2 (two) times daily. 08/26/18   Sater, Pearletha Furl, MD  potassium chloride SA (K-DUR,KLOR-CON) 20 MEQ tablet Take 1 tablet (20 mEq total) by mouth 2 (two) times daily. 10/28/18   Palumbo, April, MD  sucralfate (CARAFATE) 1 GM/10ML suspension Take 10 mLs (1 g total) by mouth 4 (four) times daily -  with meals and at bedtime. 10/28/18   Palumbo, April, MD    Family History Family History  Problem Relation Age of Onset   Cancer Mother    Stroke Mother    Cancer Father     Social History Social History   Tobacco Use   Smoking status: Never Smoker   Smokeless tobacco: Never Used   Tobacco comment: second hand smoker when young. Heavy exposure.  Substance Use Topics   Alcohol use: Yes    Alcohol/week: 0.0 standard drinks    Comment: Sts. she drinks beer, liquor daily, varying amts/fim   Drug use: No     Allergies   Ibuprofen and Nsaids   Review of Systems Review of Systems  Constitutional: Negative for chills, diaphoresis, fatigue, fever and unexpected weight change.  HENT: Negative for congestion, rhinorrhea, sore throat and trouble swallowing.   Eyes: Positive for visual disturbance.    Respiratory: Positive for shortness of breath. Negative for cough, chest tightness, wheezing and stridor.   Cardiovascular: Negative for chest pain, palpitations and leg swelling.  Gastrointestinal: Negative for abdominal pain, nausea and vomiting.  Endocrine: Negative for cold intolerance and heat intolerance.  Musculoskeletal: Positive for gait problem. Negative for neck pain.  Skin: Negative for pallor and rash.  Allergic/Immunologic: Positive for immunocompromised state. Negative for environmental allergies and food allergies.  Neurological: Positive for dizziness, tremors, weakness and light-headedness. Negative for syncope, speech difficulty, numbness and headaches.  Psychiatric/Behavioral: The patient is not nervous/anxious.     Physical Exam Updated Vital Signs BP 137/88 (BP Location: Right Arm)    Pulse 100    Temp 97.6 F (36.4 C) (Oral)    Resp 16    Ht 5\' 5"  (1.651 m)    Wt 64.4 kg    LMP 08/13/2014 Comment: very few periods   SpO2 100%    BMI 23.63 kg/m  Physical Exam Vitals signs and nursing note reviewed.  Constitutional:      General: She is not in acute distress.    Appearance: She is well-developed. She is not diaphoretic.  HENT:     Head: Normocephalic and atraumatic.  Eyes:     Extraocular Movements: Extraocular movements intact.     Pupils: Pupils are equal, round, and reactive to light.  Neck:     Musculoskeletal: Normal range of motion and neck supple.  Cardiovascular:     Rate and Rhythm: Normal rate and regular rhythm.     Heart sounds: Normal heart sounds. No murmur. No friction rub. No gallop.   Pulmonary:     Effort: Pulmonary effort is normal. No respiratory distress.     Breath sounds: Normal breath sounds. No decreased breath sounds, wheezing, rhonchi or rales.  Abdominal:     Palpations: Abdomen is soft.     Tenderness: There is no abdominal tenderness.  Musculoskeletal: Normal range of motion.     Right lower leg: She exhibits tenderness.  Edema present.     Left lower leg: She exhibits tenderness. Edema present.  Skin:    Findings: No erythema or rash.  Neurological:     Mental Status: She is alert and oriented to person, place, and time.    Mental Status:  Alert, oriented, thought content appropriate, able to give a coherent history. Speech fluent without evidence of aphasia. Able to follow 2 step commands without difficulty.  Cranial Nerves:  II:  Peripheral visual fields grossly normal, pupils equal, round, reactive to light III,IV, VI: ptosis not present, extra-ocular motions intact bilaterally  V,VII: smile symmetric, facial light touch sensation equal VIII: hearing grossly normal to voice  X: uvula elevates symmetrically  XI: bilateral shoulder shrug symmetric and strong XII: midline tongue extension without fassiculations Motor:  Normal tone. 4/5 in upper and lower extremities bilaterally including strong and equal grip strength and dorsiflexion/plantar flexion Sensory: light touch normal in all extremities.  Deep Tendon Reflexes: 2+ and symmetric in the biceps and patella Cerebellar: normal finger-to-nose with bilateral upper extremities Gait: patient is not able to ambulate due to weakness. CV: distal pulses palpable throughout    ED Treatments / Results  Labs (all labs ordered are listed, but only abnormal results are displayed) Labs Reviewed  BASIC METABOLIC PANEL - Abnormal; Notable for the following components:      Result Value   Sodium 127 (*)    Potassium 3.2 (*)    Chloride 85 (*)    Glucose, Bld 119 (*)    Creatinine, Ser 1.08 (*)    GFR calc non Af Amer 57 (*)    Anion gap 16 (*)    All other components within normal limits  CBC WITH DIFFERENTIAL/PLATELET - Abnormal; Notable for the following components:   RBC 3.64 (*)    MCV 100.5 (*)    Monocytes Absolute 1.2 (*)    All other components within normal limits  URINALYSIS, ROUTINE W REFLEX MICROSCOPIC - Abnormal; Notable for the  following components:   Ketones, ur 15 (*)    All other components within normal limits  BRAIN NATRIURETIC PEPTIDE  D-DIMER, QUANTITATIVE (NOT AT [facility])  PREGNANCY, URINE  TROPONIN I    EKG EKG Interpretation  Date/Time:  Wednesday October 29 2018 16:23:33 EDT Ventricular Rate:  97 PR Interval:    QRS Duration: 94 QT Interval:  364 QTC Calculation: 463 R Axis:   44 Text Interpretation:  Sinus rhythm Nonspecific T  abnrm, anterolateral leads Baseline wander in lead(s) V1 Since last tracing rate slower Confirmed by Linwood Dibbles [phone]) on 10/29/2018 4:49:00 PM   Radiology Dg Chest 2 View  Result Date: 10/29/2018 CLINICAL DATA:  Short of breath EXAM: CHEST - 2 VIEW COMPARISON:  10/13/2018 FINDINGS: Heart size and vascularity normal. Lungs are clear without infiltrate or effusion. Chronic rib fractures bilaterally. IMPRESSION: No active cardiopulmonary disease. Electronically Signed   By: Marlan Palau M.D.   On: 10/29/2018 17:13   Ct Head Wo Contrast  Result Date: 10/28/2018 CLINICAL DATA:  Fall, vision changes. History of multiple sclerosis. EXAM: CT HEAD WITHOUT CONTRAST TECHNIQUE: Contiguous axial images were obtained from the base of the skull through the vertex without intravenous contrast. COMPARISON:  CT HEAD August 24, 2018 and MRI head August 04, 2017 FINDINGS: BRAIN: No intraparenchymal hemorrhage, mass effect nor midline shift. Mild-to-moderate parenchymal brain volume loss. No hydrocephalus. Confluent supratentorial white matter hypodensities. VASCULAR: Trace calcific atherosclerosis. SKULL/SOFT TISSUES: No skull fracture. Small LEFT parietal scalp hematoma without subcutaneous gas or radiopaque foreign bodies. ORBITS/SINUSES: The included ocular globes and orbital contents are normal.Mild paranasal sinus mucosal thickening. Mastoid air cells are well aerated. OTHER: None. IMPRESSION: 1. No acute intracranial process.  Small LEFT scalp hematoma. 2. Similar white matter changes  previously characterized as chronic demyelination. 3. Stable mild-to-moderate parenchymal brain volume loss. Electronically Signed   By: Awilda Metro M.D.   On: 10/28/2018 05:57   Ct Abdomen Pelvis W Contrast  Result Date: 10/28/2018 CLINICAL DATA:  Abdominal pain with nausea and vomiting EXAM: CT ABDOMEN AND PELVIS WITH CONTRAST TECHNIQUE: Multidetector CT imaging of the abdomen and pelvis was performed using the standard protocol following bolus administration of intravenous contrast. CONTRAST:  OMNIPAQUE 300 COMPARISON:  12/14/2017 FINDINGS: Lower chest: Inadvertent scanning of the entire chest was performed. The lungs are well aerated bilaterally. No focal infiltrate or sizable effusion is seen. No nodules are noted. Thoracic inlet demonstrates tiny hypodensity within the right lobe of the thyroid. No vascular abnormality is seen. Hepatobiliary: Fatty infiltration of the liver is noted. The gallbladder has been surgically removed. Pancreas: Unremarkable. No pancreatic ductal dilatation or surrounding inflammatory changes. Spleen: Normal in size without focal abnormality. Adrenals/Urinary Tract: Adrenal glands are within normal limits. Kidneys demonstrate a normal enhancement pattern bilaterally. No renal calculi or urinary tract obstructive changes are noted. Bladder is well distended. Stomach/Bowel: Mild diverticular change of the colon is noted without evidence of diverticulitis. The appendix is within normal limits. No obstructive or inflammatory changes of the bowel are seen. Changes of prior gastric sleeve surgery are noted. Mild sliding-type hiatal hernia is noted. Vascular/Lymphatic: No significant vascular findings are present. No enlarged abdominal or pelvic lymph nodes. Reproductive: Uterus is within normal limits. Bilateral ovarian cystic change is noted and stable. The largest of these lies on the right measuring 3.6 cm. Other: No free fluid is noted. Mild fat containing umbilical  hernia is again noted and stable. Musculoskeletal: Degenerative changes of lumbar spine are seen. No acute bony abnormality is noted. Chronic compression deformity of L1 is noted. Old pubic rami fractures on the left are noted. IMPRESSION: Chronic changes as described above without acute abnormality. Electronically Signed   By: Alcide Clever M.D.   On: 10/28/2018 06:19    Procedures Procedures (including critical care time)  Medications Ordered in ED Medications  ipratropium-albuterol (DUONEB) 0.5-2.5 (3) MG/3ML nebulizer solution 3 mL (3 mLs Nebulization Given 10/29/18 1808)  sodium chloride 0.9 % bolus 1,000 mL (  Intravenous Rate/Dose Verify 10/29/18 1811)     Initial Impression / Assessment and Plan / ED Course  I have reviewed the triage vital signs and the nursing notes.  Pertinent labs & imaging results that were available during my care of the patient were reviewed by me and considered in my medical decision making (see chart for details).  Clinical Course as of Oct 28 1940  Wed Oct 29, 2018  1718 No active cardiopulmonary disease noted on CXR.  DG Chest 2 View [AH]  1718 Hyponatremia noted at 127. Patient appears to have chronic hyponatremia. Will provide IVF.  Sodium(!): 127 [AH]  1719 Creatinine elevated 1.08. Will provide IVF.  Creatinine(!): 1.08 [AH]  1829 Patient reports symptoms have improved while in the ER.   [AH]  1903 Patient denies any vision changes, dizziness, or shortness of breath while in the ER. Patient reports symptoms have significantly improved with IVF.   [AH]    Clinical Course User Index [AH] Leretha Dykes, [place]      Patient presents with shortness of breath and dizziness. CXR does not reveal active cardiopulmonary disease, troponin is negative, d dimer is negative, and heart is RRR without JVD or new murmur, breath sounds equal bilaterally, and EKG without acute abnormalities. Orthostatic vitals were positive and hyponatremia noted at 127. Provided  IVF. Provided a breathing treatment while in the ER. Symptoms resolved while in the ER. Patient is able to ambulate without difficulty prior to discharge. Pt has been advised to return to the ED if symptoms worsen or become concerning in any way. Advised patient to follow up with PCP in 2 days and follow up with neurologist. Pt appears reliable for follow up and is agreeable to discharge. Case has been discussed with Dr. Lynelle Doctor.  Final Clinical Impressions(s) / ED Diagnoses   Final diagnoses:  SOB (shortness of breath)  Dizziness    ED Discharge Orders    None       Leretha Dykes, [place] 10/29/18 1942    Linwood Dibbles, MD 11/02/18 [phone]

## 2018-10-29 NOTE — Telephone Encounter (Signed)
I have the forms and reviewed those. I have some concern about her recent repeat hospitalization, recent ED visit where she felt like could barefy stand. Abnormal labs. Need to discuss her fitness to drive. How often she drives. What distance. Recent events cause some concern. Please keep appointmenton Friday. What time is her appointment. We could fax form over on Friday cutting it close.   But I have to be responsible and evaluate her closely in light of recent reports I have been seeing.  Call pt and husband and explain importance of follow up appointment.

## 2018-10-29 NOTE — Telephone Encounter (Signed)
Please get her scheduled earlier in the day around 10:20 am. So I will have time to fill out form and fax over dot form if I clear her. Please let me know. Explain to her importance of getting her in sooner in day to allow time to fax form over if I send it clear for driving.

## 2018-10-30 NOTE — Telephone Encounter (Signed)
Tried to reach pt no answer.

## 2018-10-31 ENCOUNTER — Inpatient Hospital Stay: Payer: Federal, State, Local not specified - PPO | Admitting: Medical

## 2018-10-31 ENCOUNTER — Other Ambulatory Visit: Payer: Self-pay

## 2018-10-31 ENCOUNTER — Ambulatory Visit: Payer: Federal, State, Local not specified - PPO | Admitting: Medical

## 2018-10-31 ENCOUNTER — Other Ambulatory Visit: Payer: Self-pay | Admitting: Medical

## 2018-10-31 ENCOUNTER — Encounter: Payer: Self-pay | Admitting: Medical

## 2018-10-31 VITALS — BP 140/80 | HR 85 | Temp 98.0°F | Resp 16 | Ht 65.0 in | Wt 148.6 lb

## 2018-10-31 DIAGNOSIS — M5032 Other cervical disc degeneration, mid-cervical region, unspecified level: Secondary | ICD-10-CM | POA: Diagnosis not present

## 2018-10-31 DIAGNOSIS — E876 Hypokalemia: Secondary | ICD-10-CM

## 2018-10-31 DIAGNOSIS — I10 Essential (primary) hypertension: Secondary | ICD-10-CM | POA: Diagnosis not present

## 2018-10-31 DIAGNOSIS — J4520 Mild intermittent asthma, uncomplicated: Secondary | ICD-10-CM

## 2018-10-31 DIAGNOSIS — M9902 Segmental and somatic dysfunction of thoracic region: Secondary | ICD-10-CM | POA: Diagnosis not present

## 2018-10-31 DIAGNOSIS — M531 Cervicobrachial syndrome: Secondary | ICD-10-CM | POA: Diagnosis not present

## 2018-10-31 DIAGNOSIS — M5134 Other intervertebral disc degeneration, thoracic region: Secondary | ICD-10-CM | POA: Diagnosis not present

## 2018-10-31 LAB — COMPREHENSIVE METABOLIC PANEL
ALT: 33 U/L (ref 0–35)
AST: 52 U/L — ABNORMAL HIGH (ref 0–37)
Albumin: 3.9 g/dL (ref 3.5–5.2)
Alkaline Phosphatase: 81 U/L (ref 39–117)
BUN: 14 mg/dL (ref 6–23)
CO2: 34 mEq/L — ABNORMAL HIGH (ref 19–32)
Calcium: 9.6 mg/dL (ref 8.4–10.5)
Chloride: 89 mEq/L — ABNORMAL LOW (ref 96–112)
Creatinine, Ser: 0.68 mg/dL (ref 0.40–1.20)
GFR: 88.76 mL/min (ref >60.00)
Glucose, Bld: 86 mg/dL (ref 70–99)
Potassium: 4.7 mEq/L (ref 3.5–5.1)
Sodium: 131 mEq/L — ABNORMAL LOW (ref 135–145)
Total Bilirubin: 0.6 mg/dL (ref 0.2–1.2)
Total Protein: 6.5 g/dL (ref 6.0–8.3)

## 2018-10-31 MED ORDER — HYDROCHLOROTHIAZIDE 25 MG PO TABS: ORAL_TABLET | ORAL | 0 refills | Status: DC

## 2018-10-31 MED ORDER — LOSARTAN POTASSIUM 25 MG PO TABS: 25.0000 mg | ORAL_TABLET | Freq: Every day | ORAL | 0 refills | Status: DC

## 2018-10-31 MED ORDER — ALBUTEROL SULFATE (2.5 MG/3ML) 0.083% IN NEBU: 2.5000 mg | INHALATION_SOLUTION | Freq: Four times a day (QID) | RESPIRATORY_TRACT | 1 refills | Status: DC | PRN

## 2018-10-31 NOTE — Patient Instructions (Addendum)
Your blood pressure is borderline high today.  I am going to prescribe you low-dose losartan to take daily.  You have recently had ED visits for near syncope, dizziness and a low potassium/sodium as well.  These events are very likely due to the hydralazine.  I think is best for you to stop the hydralazine.  You have expressed frustration with intermittent pedal edema in the past.  So if this does occur and I just want to use 1 tablet of low dose HCTZ on occasion.  I am only giving you 4 tablets to have available to use over 1 month if needed.  We do get pedal edema also want you to use your TED hose compression stockings and elevate your legs.  You have intermittent asthma and lungs sound clear today.  I am going to provide albuterol solution available for you to use.  I have reviewed your recent neurologist note regarding MS visit and things appear stable.   I did fill out your DMV form today.  Initially I was hesitant as I was reviewing your recent hospitalization but all of those visits appear to be related to the electrolyte abnormalities related to likely diuretic use.  So I did go ahead and fill out the form to fax over but the neurology section was not filled out.  So please get them to fill out that part today so I can fax the form over later today.  Please get metabolic panel today so I can recheck your potassium, sodium and your kidney function.  Follow-up 10 days or as needed

## 2018-10-31 NOTE — Progress Notes (Signed)
Subjective:    Patient ID: Catherine Hubbard, female    DOB: [birth year], 59 y.o.   MRN: [id number]  HPI  Pt in for follow up.  She states she is stressed about current health issues.   Pt had recent  ED evaluation on 10-27-2018  She had low potassium and uti early February. She states near syncope before she went to the ED. Treated for both and released.  Also on Sep 25, 2018. She had tracheobronchitis vs pneumonia and potassium was low on that visit as well. Admitted due to elevated troponin. Pt had demand ischemia. Eventually discharged.   Pt recent had low potassium and low sodium when she went to ED again on 10-29-2018. Reviewed that note today.   Pt does have some asthma. Recently stable. She needs albutero solution to use if needed..   Pt she uses readers but otherwise does not wear glasses. Does not see optometrist.  Pt needs dmv form filled.  She was pulled over since she felt something wrong with her tire. She explained this to officer. Pt states she could not hear him well. Then next week dmv paperwork sent to her home.      Review of Systems  Constitutional: Negative for chills, fatigue and fever.  HENT: Negative for congestion, ear discharge, ear pain, mouth sores, nosebleeds, postnasal drip and sinus pressure.   Respiratory: Negative for cough, chest tightness, shortness of breath and wheezing.   Cardiovascular: Negative for chest pain and palpitations.  Gastrointestinal: Negative for abdominal pain.  Genitourinary: Negative for difficulty urinating, dysuria, flank pain and hematuria.  Musculoskeletal: Negative for back pain, joint swelling and neck pain.  Skin: Negative for rash.  Neurological: Negative for dizziness, seizures, weakness, light-headedness and numbness.  Hematological: Negative for adenopathy. Does not bruise/bleed easily.  Psychiatric/Behavioral: Negative for behavioral problems, decreased concentration, dysphoric mood and suicidal ideas. The  patient is not nervous/anxious.     Past Medical History:  Diagnosis Date  . Asthma   . Esophagitis   . GERD (gastroesophageal reflux disease)   . Headache   . Hearing loss   . Hypertension   . Multiple sclerosis (HCC)   . Vision abnormalities      Social History   Socioeconomic History  . Marital status: Married    Spouse name: Not on file  . Number of children: Not on file  . Years of education: Not on file  . Highest education level: Not on file  Occupational History  . Not on file  Social Needs  . Financial resource strain: Not on file  . Food insecurity:    Worry: Not on file    Inability: Not on file  . Transportation needs:    Medical: Not on file    Non-medical: Not on file  Tobacco Use  . Smoking status: Never Smoker  . Smokeless tobacco: Never Used  . Tobacco comment: second hand smoker when young. Heavy exposure.  Substance and Sexual Activity  . Alcohol use: Yes    Alcohol/week: 0.0 standard drinks    Comment: Sts. she drinks beer, liquor daily, varying amts/fim  . Drug use: No  . Sexual activity: Never  Lifestyle  . Physical activity:    Days per week: Not on file    Minutes per session: Not on file  . Stress: Not on file  Relationships  . Social connections:    Talks on phone: Not on file    Gets together: Not on file    Attends  religious service: Not on file    Active member of club or organization: Not on file    Attends meetings of clubs or organizations: Not on file    Relationship status: Not on file  . Intimate partner violence:    Fear of current or ex partner: Not on file    Emotionally abused: Not on file    Physically abused: Not on file    Forced sexual activity: Not on file  Other Topics Concern  . Not on file  Social History Narrative  . Not on file    Past Surgical History:  Procedure Laterality Date  . ANKLE FRACTURE SURGERY Bilateral   . CARPAL TUNNEL RELEASE Bilateral   . COLONOSCOPY  11/2016   multiple  .  ESOPHAGOGASTRODUODENOSCOPY  11/2016  . ESOPHAGOGASTRODUODENOSCOPY N/A 02/01/2017   Procedure: ESOPHAGOGASTRODUODENOSCOPY (EGD);  Surgeon: Iva Boop, MD;  Location[facility] ENDOSCOPY;  Service: Endoscopy;  Laterality: N/A;  . ESOPHAGOGASTRODUODENOSCOPY (EGD) WITH PROPOFOL N/A 02/04/2017   Procedure: ESOPHAGOGASTRODUODENOSCOPY (EGD) WITH PROPOFOL;  Surgeon: Meryl Dare, MD;  Location: [facility] ENDOSCOPY;  Service: Endoscopy;  Laterality: N/A;  . LAPAROSCOPIC GASTRIC SLEEVE RESECTION  2011  . ULNAR NERVE REPAIR Bilateral     Family History  Problem Relation Age of Onset  . Cancer Mother   . Stroke Mother   . Cancer Father     Allergies  Allergen Reactions  . 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 Nsaids     Gastric bypass    Current Outpatient Medications on File Prior to Visit  Medication Sig Dispense Refill  . baclofen (LIORESAL) 10 MG tablet Take 1 tablet (10 mg total) by mouth 3 (three) times daily. (Patient taking differently: Take 10 mg by mouth at bedtime. ) 90 each 05  . chlorthalidone (HYGROTON) 25 MG tablet Take 25 mg by mouth daily.    . DULoxetine (CYMBALTA) 60 MG capsule Take 1 capsule (60 mg total) by mouth daily. 30 capsule 11  . modafinil (PROVIGIL) 200 MG tablet Take 1 tablet (200 mg total) by mouth daily. (Patient taking differently: Take 200 mg by mouth daily as needed (tired). ) 30 tablet 0  . Multiple Vitamin (MULTIVITAMIN WITH MINERALS) TABS tablet Take 1 tablet by mouth daily.    Marland Kitchen ocrelizumab 600 mg in sodium chloride 0.9 % 500 mL Inject 600 mg into the vein every 6 (six) months. Last infusion May 2019 - due in December 2019    . omeprazole (PRILOSEC) 20 MG capsule Take 40 mg by mouth 2 (two) times daily.    . ondansetron (ZOFRAN ODT) 8 MG disintegrating tablet 8mg  ODT q8 hours prn nausea 4 tablet 0  . oxybutynin (DITROPAN) 5 MG tablet Take 1 tablet (5 mg total) by mouth 2 (two) times daily. 60 tablet 5  . potassium chloride SA  (K-DUR,KLOR-CON) 20 MEQ tablet Take 1 tablet (20 mEq total) by mouth 2 (two) times daily. 10 tablet 0  . sucralfate (CARAFATE) 1 GM/10ML suspension Take 10 mLs (1 g total) by mouth 4 (four) times daily -  with meals and at bedtime. 420 mL 0   No current facility-administered medications on file prior to visit.     BP (!) 144/102   Pulse 85   Temp 98 F (36.7 C) (Oral)   Resp 16   Ht 5\' 5"  (1.651 m)   Wt 148 lb 9.6 oz (67.4 kg)   LMP 08/13/2014 Comment: very few periods  SpO2 99%   BMI 24.73 kg/m       Objective:   Physical Exam  General Mental Status- Alert. General Appearance- Not in acute distress.   Skin General: Color- Normal Color. Moisture- Normal Moisture.  Neck Carotid Arteries- Normal color. Moisture- Normal Moisture. No carotid bruits. No JVD.  Chest and Lung Exam Auscultation: Breath Sounds:-Normal.  Cardiovascular Auscultation:Rythm- Regular. Murmurs & Other Heart Sounds:Auscultation of the heart reveals- No Murmurs.  Abdomen Inspection:-Inspeection Normal. Palpation/Percussion:Note:No mass. Palpation and Percussion of the abdomen reveal- Non Tender, Non Distended + BS, no rebound or guarding.   Neurologic Cranial Nerve exam:- CN III-XII intact(No nystagmus), symmetric smile. Strength:- 5/5 equal and symmetric strength both upper and lower extremities.  Lower ext- no pedal edema.      Assessment & Plan:  Your blood pressure is borderline high today.  I am going to prescribe you low-dose losartan to take daily.  You have recently had ED visits for near syncope, dizziness and a low potassium/sodium as well.  These events are very likely due to the hydralazine.  I think is best for you to stop the hydralazine.  You have expressed frustration with intermittent pedal edema in the past.  So if this does occur and I just want to use 1 tablet of low dose HCTZ on occasion.  I am only giving you 4 tablets to have available to use over 1 month if needed.  We  do get pedal edema also want you to use your TED hose compression stockings and elevate your legs.  You have intermittent asthma and lungs sound clear today.  I am going to provide albuterol solution available for you to use.  I have reviewed your recent neurologist note regarding MS visit and things appear stable.   I did fill out your DMV form today.  Initially I was hesitant as I was reviewing your recent hospitalization but all of those visits appear to be related to the electrolyte abnormalities related to likely diuretic use.  So I did go ahead and fill out the form to fax over but the neurology section was not filled out.  So please get them to fill out that part today so I can fax the form over later today.  Please get metabolic panel today so I can recheck your potassium, sodium and your kidney function.  Follow-up 10 days or as needed  40 minutes spent with pt. 50% of time spent counseling on how we will treat intermittent severe dependant edema. Avoid daily diuretic use. Also counseled on DMV form. Plan going forward and will need periodic evaluation again in 6 months. I had considered OT driver evaluation but then saw all of her recent ED evaluations were related electrolyte abnormality and not her MS. Reviewed neuro note and that appears treated/controlled.

## 2018-11-01 ENCOUNTER — Other Ambulatory Visit: Payer: Self-pay

## 2018-11-01 ENCOUNTER — Encounter (HOSPITAL_BASED_OUTPATIENT_CLINIC_OR_DEPARTMENT_OTHER): Payer: Self-pay | Admitting: Emergency Medicine

## 2018-11-01 ENCOUNTER — Emergency Department (HOSPITAL_BASED_OUTPATIENT_CLINIC_OR_DEPARTMENT_OTHER)
Admission: EM | Admit: 2018-11-01 | Discharge: 2018-11-02 | Disposition: A | Discharge status: Home / Self Care | Payer: Federal, State, Local not specified - PPO | Attending: Emergency Medicine | Admitting: Emergency Medicine

## 2018-11-01 ENCOUNTER — Emergency Department (HOSPITAL_BASED_OUTPATIENT_CLINIC_OR_DEPARTMENT_OTHER): Payer: Federal, State, Local not specified - PPO

## 2018-11-01 ENCOUNTER — Other Ambulatory Visit: Payer: Self-pay | Admitting: Neurology

## 2018-11-01 DIAGNOSIS — K209 Esophagitis, unspecified without bleeding: Secondary | ICD-10-CM

## 2018-11-01 DIAGNOSIS — R1010 Upper abdominal pain, unspecified: Secondary | ICD-10-CM | POA: Diagnosis not present

## 2018-11-01 DIAGNOSIS — Z79899 Other long term (current) drug therapy: Secondary | ICD-10-CM | POA: Insufficient documentation

## 2018-11-01 DIAGNOSIS — R1013 Epigastric pain: Secondary | ICD-10-CM | POA: Diagnosis not present

## 2018-11-01 DIAGNOSIS — R1011 Right upper quadrant pain: Secondary | ICD-10-CM | POA: Diagnosis not present

## 2018-11-01 DIAGNOSIS — K858 Other acute pancreatitis without necrosis or infection: Secondary | ICD-10-CM | POA: Diagnosis not present

## 2018-11-01 DIAGNOSIS — J45909 Unspecified asthma, uncomplicated: Secondary | ICD-10-CM | POA: Diagnosis not present

## 2018-11-01 DIAGNOSIS — I10 Essential (primary) hypertension: Secondary | ICD-10-CM | POA: Diagnosis not present

## 2018-11-01 LAB — COMPREHENSIVE METABOLIC PANEL
ALK PHOS: 69 U/L (ref 38–126)
ALT: 36 U/L (ref 0–44)
AST: 57 U/L — ABNORMAL HIGH (ref 15–41)
Albumin: 4.2 g/dL (ref 3.5–5.0)
Anion gap: 11 (ref 5–15)
BUN: 17 mg/dL (ref 6–20)
CHLORIDE: 88 mmol/L — AB (ref 98–111)
CO2: 30 mmol/L (ref 22–32)
CREATININE: 0.65 mg/dL (ref 0.44–1.00)
Calcium: 9.6 mg/dL (ref 8.9–10.3)
GFR calc Af Amer: >60 mL/min (ref >60)
GFR calc non Af Amer: >60 mL/min (ref >60)
Glucose, Bld: 84 mg/dL (ref 70–99)
Potassium: 2.8 mmol/L — ABNORMAL LOW (ref 3.5–5.1)
Sodium: 129 mmol/L — ABNORMAL LOW (ref 135–145)
Total Bilirubin: 0.7 mg/dL (ref 0.3–1.2)
Total Protein: 7.3 g/dL (ref 6.5–8.1)

## 2018-11-01 LAB — URINALYSIS, ROUTINE W REFLEX MICROSCOPIC
BILIRUBIN URINE: NEGATIVE
Glucose, UA: NEGATIVE mg/dL
Hgb urine dipstick: NEGATIVE
KETONES UR: NEGATIVE mg/dL
Leukocytes,Ua: NEGATIVE
Nitrite: NEGATIVE
Protein, ur: NEGATIVE mg/dL
Specific Gravity, Urine: 1.015 (ref 1.005–1.030)
pH: 6 (ref 5.0–8.0)

## 2018-11-01 LAB — LIPASE, BLOOD: Lipase: 158 U/L — ABNORMAL HIGH (ref 11–51)

## 2018-11-01 LAB — CBC
HCT: 37.8 % (ref 36.0–46.0)
Hemoglobin: 12.5 g/dL (ref 12.0–15.0)
MCH: 33.1 pg (ref 26.0–34.0)
MCHC: 33.1 g/dL (ref 30.0–36.0)
MCV: 100 fL (ref 80.0–100.0)
NRBC: 0 % (ref 0.0–0.2)
Platelets: 204 10*3/uL (ref 150–400)
RBC: 3.78 MIL/uL — ABNORMAL LOW (ref 3.87–5.11)
RDW: 13 % (ref 11.5–15.5)
WBC: 6.8 10*3/uL (ref 4.0–10.5)

## 2018-11-01 MED ORDER — SODIUM CHLORIDE 0.9% FLUSH
3.0000 mL | Freq: Once | INTRAVENOUS | Status: DC
  Filled 2018-11-01: qty 3

## 2018-11-01 MED ORDER — ONDANSETRON 4 MG PO TBDP
4.0000 mg | ORAL_TABLET | Freq: Once | ORAL | Status: AC | PRN
  Administered 2018-11-01: 4 mg via ORAL
  Filled 2018-11-01: qty 1

## 2018-11-01 MED ORDER — IOHEXOL 300 MG/ML  SOLN
100.0000 mL | Freq: Once | INTRAMUSCULAR | Status: AC | PRN
  Administered 2018-11-01: 100 mL via INTRAVENOUS

## 2018-11-01 MED ORDER — SODIUM CHLORIDE 0.9 % IV BOLUS
1000.0000 mL | Freq: Once | INTRAVENOUS | Status: AC
  Administered 2018-11-01: 1000 mL via INTRAVENOUS

## 2018-11-01 MED ORDER — ALUM & MAG HYDROXIDE-SIMETH 200-200-20 MG/5ML PO SUSP
30.0000 mL | Freq: Once | ORAL | Status: AC
  Administered 2018-11-01: 30 mL via ORAL
  Filled 2018-11-01: qty 30

## 2018-11-01 MED ORDER — HYDROMORPHONE HCL 1 MG/ML IJ SOLN
1.0000 mg | Freq: Once | INTRAMUSCULAR | Status: AC
  Administered 2018-11-01: 1 mg via INTRAVENOUS
  Filled 2018-11-01: qty 1

## 2018-11-01 NOTE — ED Provider Notes (Signed)
[facility] EMERGENCY DEPARTMENT Provider Note  CSN: [id number] Arrival date & time: 11/01/18 2028  Chief Complaint(s) Abdominal Pain  HPI Catherine Hubbard is a 58 y.o. female with a history of esophageal stricture requiring dilatation who had esophageal dilatation 2 weeks ago. Since has had nausea and intermittent abd pain. Now, pain is constant in the epigastrum for 1 day.  Pain is described as a aching pain.  Severe nature.  With nausea and emesis with streaky blood.  Patient reported a prior history of heavy alcohol use but states that she has not drank alcohol in 2 months.  She denies any associated fevers or chills.  No chest pain or shortness of breath.  No urinary symptoms.  No diarrhea.  HPI   Past Medical History Past Medical History:  Diagnosis Date   Asthma    Esophagitis    GERD (gastroesophageal reflux disease)    Headache    Hearing loss    Hypertension    Multiple sclerosis (HCC)    Vision abnormalities    Patient Active Problem List   Diagnosis Date Noted   Pneumonia 09/26/2018   Elevated troponin 09/26/2018   Acute hypokalemia 09/25/2018   Arthritis of carpometacarpal joint 03/27/2018   Nocturnal leg cramps 03/26/2018   Syncope due to orthostatic hypotension 02/12/2018   Diarrhea 02/08/2018   Acute lower UTI 02/08/2018   Hematoma of scalp 02/08/2018   Neck pain 01/20/2018   Foreign body in esophagus    Esophageal stricture    Intractable nausea and vomiting 01/31/2017   Dysphagia    Severe protein-calorie malnutrition (HCC)    Nausea & vomiting 01/30/2017   Hypokalemia 01/30/2017   Acute kidney injury (HCC) 01/30/2017   Cystitis 01/30/2017   Costochondritis 05/11/2016   Trochanteric bursitis of both hips 03/28/2016   Upper back pain 03/28/2016   Bilateral low back pain with bilateral sciatica 07/13/2015   Right hip pain 07/13/2015   Lumbar radicular pain 03/21/2015   Other fatigue 01/11/2015    Dysesthesia 01/11/2015   Urinary urgency 01/11/2015   Multiple sclerosis (HCC) 10/06/2014   Numbness 10/06/2014   Ataxic gait 10/06/2014   High risk medication use 10/06/2014   Gastric bypass status for obesity 10/06/2014   Cognitive changes 10/06/2014   Depression with anxiety 10/06/2014   Restless leg 10/06/2014   Insomnia 10/06/2014   Essential hypertension 10/06/2014   Difficulty hearing 01/13/2014   Other muscle spasm 01/13/2014   Home Medication(s) Prior to Admission medications   Medication Sig Start Date End Date Taking? Authorizing Provider  albuterol (PROVENTIL) (2.5 MG/3ML) 0.083% nebulizer solution Take 3 mLs (2.5 mg total) by nebulization every 6 (six) hours as needed for wheezing or shortness of breath. 10/31/18   Saguier, Ramon Dredge, PA-C  baclofen (LIORESAL) 10 MG tablet Take 1 tablet (10 mg total) by mouth 3 (three) times daily. Patient taking differently: Take 10 mg by mouth at bedtime.  08/26/18   Sater, Pearletha Furl, MD  DULoxetine (CYMBALTA) 60 MG capsule Take 1 capsule (60 mg total) by mouth daily. 08/26/18   Sater, Pearletha Furl, MD  hydrochlorothiazide (HYDRODIURIL) 25 MG tablet 1 tab po daily as needed severe pedal edema. 10/31/18   Saguier, Ramon Dredge, PA-C  losartan (COZAAR) 25 MG tablet Take 1 tablet (25 mg total) by mouth daily. 10/31/18   Saguier, Ramon Dredge, PA-C  modafinil (PROVIGIL) 200 MG tablet Take 1 tablet (200 mg total) by mouth daily. Patient taking differently: Take 200 mg by mouth daily as needed (tired).  09/22/18  Sater, Pearletha Furl, MD  Multiple Vitamin (MULTIVITAMIN WITH MINERALS) TABS tablet Take 1 tablet by mouth daily.    [provider]  ocrelizumab 600 mg in sodium chloride 0.9 % 500 mL Inject 600 mg into the vein every 6 (six) months. Last infusion May 2019 - due in December 2019    [provider]  omeprazole (PRILOSEC) 20 MG capsule Take 40 mg by mouth 2 (two) times daily.    [provider]  ondansetron (ZOFRAN ODT) 4 MG  disintegrating tablet Take 1 tablet (4 mg total) by mouth every 8 (eight) hours as needed for up to 3 days for nausea or vomiting. 11/02/18 11/05/18  Undrea Archbold, Amadeo Garnet, MD  oxybutynin (DITROPAN) 5 MG tablet Take 1 tablet (5 mg total) by mouth 2 (two) times daily. 08/26/18   Sater, Pearletha Furl, MD  potassium chloride SA (K-DUR,KLOR-CON) 20 MEQ tablet Take 1 tablet (20 mEq total) by mouth 2 (two) times daily. 10/28/18   Palumbo, April, MD  sucralfate (CARAFATE) 1 GM/10ML suspension Take 10 mLs (1 g total) by mouth 4 (four) times daily -  with meals and at bedtime. 10/28/18   Palumbo, April, MD  traMADol (ULTRAM) 50 MG tablet Take 1 tablet (50 mg total) by mouth every 8 (eight) hours as needed for up to 5 days. 11/02/18 11/07/18  Nira Conn, MD                                                                                                                                    Past Surgical History Past Surgical History:  Procedure Laterality Date   ANKLE FRACTURE SURGERY Bilateral    CARPAL TUNNEL RELEASE Bilateral    COLONOSCOPY  11/2016   multiple   ESOPHAGOGASTRODUODENOSCOPY  11/2016   ESOPHAGOGASTRODUODENOSCOPY N/A 02/01/2017   Procedure: ESOPHAGOGASTRODUODENOSCOPY (EGD);  Surgeon: Iva Boop, MD;  Location: [facility] ENDOSCOPY;  Service: Endoscopy;  Laterality: N/A;   ESOPHAGOGASTRODUODENOSCOPY (EGD) WITH PROPOFOL N/A 02/04/2017   Procedure: ESOPHAGOGASTRODUODENOSCOPY (EGD) WITH PROPOFOL;  Surgeon: Meryl Dare, MD;  Location: [facility] ENDOSCOPY;  Service: Endoscopy;  Laterality: N/A;   LAPAROSCOPIC GASTRIC SLEEVE RESECTION  2011   ULNAR NERVE REPAIR Bilateral    Family History Family History  Problem Relation Age of Onset   Cancer Mother    Stroke Mother    Cancer Father     Social History Social History   Tobacco Use   Smoking status: Never Smoker   Smokeless tobacco: Never Used   Tobacco comment: second hand smoker when young. Heavy exposure.  Substance Use Topics    Alcohol use: Not Currently    Alcohol/week: 0.0 standard drinks    Comment: Sts. she drinks beer, liquor daily, varying amts/fim   Drug use: No   Allergies Ibuprofen and Nsaids  Review of Systems Review of Systems All other systems are reviewed and are negative for acute change except as noted in the HPI  Physical Exam Vital Signs  I have reviewed the triage vital signs BP (!) 192/104 (BP Location: Left Arm)    Pulse 66    Temp 98.6 F (37 C)    Resp 18    LMP 08/13/2014 Comment: very few periods   SpO2 100%   Physical Exam Vitals signs reviewed.  Constitutional:      General: She is not in acute distress.    Appearance: She is well-developed. She is not diaphoretic.  HENT:     Head: Normocephalic and atraumatic.     Right Ear: External ear normal.     Left Ear: External ear normal.     Nose: Nose normal.  Eyes:     General: No scleral icterus.    Conjunctiva/sclera: Conjunctivae normal.  Neck:     Musculoskeletal: Normal range of motion.     Trachea: Phonation normal.  Cardiovascular:     Rate and Rhythm: Normal rate and regular rhythm.  Pulmonary:     Effort: Pulmonary effort is normal. No respiratory distress.     Breath sounds: No stridor.  Abdominal:     General: There is no distension.     Tenderness: There is abdominal tenderness in the right upper quadrant, epigastric area and left upper quadrant. There is guarding. There is no rebound.  Musculoskeletal: Normal range of motion.  Neurological:     Mental Status: She is alert and oriented to person, place, and time.  Psychiatric:        Behavior: Behavior normal.     ED Results and Treatments Labs (all labs ordered are listed, but only abnormal results are displayed) Labs Reviewed  LIPASE, BLOOD - Abnormal; Notable for the following components:      Result Value   Lipase 158 (*)    All other components within normal limits  COMPREHENSIVE METABOLIC PANEL - Abnormal; Notable for the following  components:   Sodium 129 (*)    Potassium 2.8 (*)    Chloride 88 (*)    AST 57 (*)    All other components within normal limits  CBC - Abnormal; Notable for the following components:   RBC 3.78 (*)    All other components within normal limits  LACTATE DEHYDROGENASE - Abnormal; Notable for the following components:   LDH 202 (*)    All other components within normal limits  URINALYSIS, ROUTINE W REFLEX MICROSCOPIC                                                                                                                         EKG  EKG Interpretation  Date/Time:    Ventricular Rate:    PR Interval:    QRS Duration:   QT Interval:    QTC Calculation:   R Axis:     Text Interpretation:        Radiology Ct Abdomen Pelvis W Contrast  Result Date: 11/01/2018 CLINICAL DATA:  Upper abdominal pain for 2 weeks EXAM: CT ABDOMEN  AND PELVIS WITH CONTRAST TECHNIQUE: Multidetector CT imaging of the abdomen and pelvis was performed using the standard protocol following bolus administration of intravenous contrast. CONTRAST:  OMNIPAQUE IOHEXOL 300 MG/ML  SOLN COMPARISON:  10/28/2018, 12/14/2017 CT FINDINGS: Lower chest: Lung bases demonstrate no acute consolidation or effusion. Heart size is normal. Postsurgical changes at the GE junction with evidence of small hiatal hernia. Distal esophageal thickening. Hepatobiliary: No focal liver abnormality is seen. Status post cholecystectomy. No biliary dilatation. Pancreas: Unremarkable. No pancreatic ductal dilatation or surrounding inflammatory changes. Spleen: Normal in size without focal abnormality. Adrenals/Urinary Tract: Adrenal glands are unremarkable. Kidneys are normal, without renal calculi, focal lesion, or hydronephrosis. Bladder is unremarkable. Stomach/Bowel: Stomach is within normal limits. Appendix appears normal. No evidence of bowel wall thickening, distention, or inflammatory changes. Large volume of stool in the colon.  Diverticular disease of the sigmoid colon Vascular/Lymphatic: Nonaneurysmal aorta. No significantly enlarged lymph nodes Reproductive: Uterus contains scattered calcifications. Stable 3.5 cm right adnexal cyst. Stable 13 mm left adnexal cyst. Other: Negative for free air or free fluid Musculoskeletal: Chronic superior endplate compression fracture L1. No acute or suspicious abnormality. Old left pubic rami fractures. IMPRESSION: 1. No CT evidence for acute intra-abdominal or pelvic abnormality. 2. Hiatal hernia. Mild distal esophageal thickening which may be secondary to reflux or esophagitis. 3. Stable adnexal cysts 4. Sigmoid colon diverticular disease without acute inflammatory change Electronically Signed   By: Jasmine Pang M.D.   On: 11/01/2018 23:44   Pertinent labs & imaging results that were available during my care of the patient were reviewed by me and considered in my medical decision making (see chart for details).  Medications Ordered in ED Medications  sodium chloride flush (NS) 0.9 % injection 3 mL (3 mLs Intravenous Not Given 11/01/18 2105)  ondansetron (ZOFRAN-ODT) disintegrating tablet 4 mg (4 mg Oral Given 11/01/18 2055)  sodium chloride 0.9 % bolus 1,000 mL (0 mLs Intravenous Stopped 11/02/18 0107)  HYDROmorphone (DILAUDID) injection 1 mg (1 mg Intravenous Given 11/01/18 2352)  iohexol (OMNIPAQUE) 300 MG/ML solution 100 mL (100 mLs Intravenous Contrast Given 11/01/18 2319)  alum & mag hydroxide-simeth (MAALOX/MYLANTA) 200-200-20 MG/5ML suspension 30 mL (30 mLs Oral Given 11/01/18 2352)  ondansetron (ZOFRAN) injection 4 mg (4 mg Intravenous Given 11/02/18 0013)  HYDROmorphone (DILAUDID) injection 0.5 mg (0.5 mg Intravenous Given 11/02/18 0221)  traMADol (ULTRAM) tablet 50 mg (50 mg Oral Given 11/02/18 0352)  potassium chloride 10 mEq in 100 mL IVPB (0 mEq Intravenous Stopped 11/02/18 0456)  sodium chloride 0.9 % bolus 1,000 mL (0 mLs Intravenous Stopped 11/02/18 0557)                                                                                                                                     Procedures Procedures  (including critical care time)  Medical Decision Making / ED Course I have reviewed the nursing notes for this encounter and  the patient's prior records (if available in EHR or on provided paperwork).    Work-up notable for clinical pancreatitis with elevated lipase.  Due to significant pain and recent esophageal dilatation, CT scan was obtained and did not reveal evidence of esophageal tear.  Did reveal evidence of esophagitis.  No pancreatic stranding or fluid collection.  No free air suspicious for posterior perforated peptic ulcer.  No leukocytosis.  Electrolyte similar to prior, no renal insufficiency.  LDH less than 350.  Patient treated with IV fluids and pain medicine.  Able to transition to oral pain medicine.  She was able to tolerate oral intake.  After several hours of observation, patient remained hemodynamically stable.  Felt she was appropriate for outpatient management with close GI follow-up.  Final Clinical Impression(s) / ED Diagnoses Final diagnoses:  Esophagitis  Other acute pancreatitis without infection or necrosis   Disposition: Discharge  Condition: Good  I have discussed the results, Dx and Tx plan with the patient who expressed understanding and agree(s) with the plan. Discharge instructions discussed at great length. The patient was given strict return precautions who verbalized understanding of the instructions. No further questions at time of discharge.    ED Discharge Orders         Ordered    traMADol (ULTRAM) 50 MG tablet  Every 8 hours PRN     11/02/18 0602    ondansetron (ZOFRAN ODT) 4 MG disintegrating tablet  Every 8 hours PRN     11/02/18 0602           Follow Up: Gastroenterologist  Schedule an appointment as soon as possible for a visit  in 5-7 days, If symptoms do not improve or  worsen  [place] [address] [place] [address] [phone]  Schedule an appointment as soon as possible for a visit  As needed for close follow up and additional pain control.      This chart was dictated using voice recognition software.  Despite best efforts to proofread,  errors can occur which can change the documentation meaning.   Nira Conn, MD 11/02/18 [phone]

## 2018-11-01 NOTE — ED Triage Notes (Signed)
Pt states that she is experiencing upper abdominal pain for approx 3 weeks since having an EGD. Pt states that yesterday that she was experiencing gas and noticed blood on the toilet paper. She thought it was a hemorrhoid. Pt had an episode of hematemesis. States " not a lot". Tried to follow up with gastro but cannot get in into May.

## 2018-11-02 LAB — LACTATE DEHYDROGENASE: LDH: 202 U/L — AB (ref 98–192)

## 2018-11-02 MED ORDER — TRAMADOL HCL 50 MG PO TABS: 50.0000 mg | ORAL_TABLET | Freq: Three times a day (TID) | ORAL | 0 refills | Status: AC | PRN

## 2018-11-02 MED ORDER — ONDANSETRON HCL 4 MG/2ML IJ SOLN
4.0000 mg | Freq: Once | INTRAMUSCULAR | Status: AC
  Administered 2018-11-02: 4 mg via INTRAVENOUS
  Filled 2018-11-02: qty 2

## 2018-11-02 MED ORDER — SODIUM CHLORIDE 0.9 % IV BOLUS
1000.0000 mL | Freq: Once | INTRAVENOUS | Status: AC
  Administered 2018-11-02: 1000 mL via INTRAVENOUS

## 2018-11-02 MED ORDER — POTASSIUM CHLORIDE 10 MEQ/100ML IV SOLN
10.0000 meq | Freq: Once | INTRAVENOUS | Status: AC
  Administered 2018-11-02: 10 meq via INTRAVENOUS
  Filled 2018-11-02: qty 100

## 2018-11-02 MED ORDER — ONDANSETRON 4 MG PO TBDP: 4.0000 mg | ORAL_TABLET | Freq: Three times a day (TID) | ORAL | 0 refills | Status: AC | PRN

## 2018-11-02 MED ORDER — HYDROMORPHONE HCL 1 MG/ML IJ SOLN
0.5000 mg | Freq: Once | INTRAMUSCULAR | Status: AC
  Administered 2018-11-02: 0.5 mg via INTRAVENOUS
  Filled 2018-11-02: qty 1

## 2018-11-02 MED ORDER — TRAMADOL HCL 50 MG PO TABS
50.0000 mg | ORAL_TABLET | Freq: Once | ORAL | Status: AC
  Administered 2018-11-02: 50 mg via ORAL
  Filled 2018-11-02: qty 1

## 2018-11-02 NOTE — ED Notes (Signed)
Called PAL line for consult to the hospitalist @ [facility].

## 2018-11-02 NOTE — Discharge Instructions (Addendum)

## 2018-11-02 NOTE — ED Notes (Addendum)
Pt c/o feeling nauseated denies vomiting

## 2018-11-02 NOTE — ED Notes (Signed)
Pt understood dc material. NAD noted. Scripts sent electronically. All questions answered to satisfaction. Pt and family member escorted to check out counter

## 2018-11-03 ENCOUNTER — Telehealth: Payer: Self-pay | Admitting: Medical

## 2018-11-03 NOTE — Telephone Encounter (Signed)
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  However I want you to get authorization from her whether or not we can attach the last neurologist office visit note to the DOT paperwork.  The neurology paperwork still needs to be filled out eventually by a neurologist.  But hopefully the last evaluation for neurologist would help them determine that you are stable.  Get an answer from her regarding this and then document her response.  Then fax paperwork over.

## 2018-11-03 NOTE — Telephone Encounter (Signed)
Catherine Hubbard, I sent you a note regarding her DMV letter.  That note is beside your computer.  Will you check and see if she is still hospitalized.  I can put in referral for her to see Dr. Chales Abrahams for the pancreatitis.  But typically I need to see her in the office to see how she is doing clinically.  Repeat pancreas enzymes and then make the referral.  Knowing how she is doing clinically will let me be specific as to the timeframe I am requesting her to be seen.

## 2018-11-03 NOTE — Telephone Encounter (Signed)
Opened to review 

## 2018-11-03 NOTE — Telephone Encounter (Signed)
Patient came by the office to discuss the paper work that was completed by [employer]. Patient dropped off forms at neurologist (they were closed on Friday 10/31/18 & today due to CoVID 19) and is requesting that we send in our portion of the documents to the [facility] ASAP.Patient also would like a referral to GI to Dr. Chales Abrahams. Provided phone number for GI.

## 2018-11-04 ENCOUNTER — Telehealth: Payer: Self-pay | Admitting: *Deleted

## 2018-11-04 ENCOUNTER — Telehealth: Payer: Self-pay | Admitting: Gastroenterology

## 2018-11-04 DIAGNOSIS — Z0289 Encounter for other administrative examinations: Secondary | ICD-10-CM

## 2018-11-04 MED ORDER — LOSARTAN POTASSIUM 25 MG PO TABS: ORAL_TABLET | ORAL | 3 refills | Status: DC

## 2018-11-04 NOTE — Telephone Encounter (Signed)
Dr. Chales Abrahams, pt is a current pt of Dr. Octaviano Glow at [facility] are in [facility].  Pt requested to switch GI provider because she was not content with the bureaucracy of that practice.  Pt requested you as a GI doctor.  Will you accept this pt?

## 2018-11-04 NOTE — Telephone Encounter (Signed)
Rx refill losartan sent to pt phramacy. Not sure why refill request sent to me already filled the other day.

## 2018-11-04 NOTE — Telephone Encounter (Signed)
Gave completed/signed DMV paperwork back to medical records to process for pt.

## 2018-11-05 ENCOUNTER — Telehealth: Payer: Self-pay | Admitting: *Deleted

## 2018-11-05 NOTE — Telephone Encounter (Signed)
No problems You can put on my schedule Preferred to be seen once " coronavirus scare is over" -unless any problems

## 2018-11-05 NOTE — Telephone Encounter (Signed)
LVM I was unable to reach pt.

## 2018-11-06 ENCOUNTER — Telehealth: Payer: Self-pay | Admitting: Medical

## 2018-11-06 MED ORDER — OLMESARTAN MEDOXOMIL 20 MG PO TABS: 20.0000 mg | ORAL_TABLET | Freq: Every day | ORAL | 3 refills | Status: DC

## 2018-11-06 NOTE — Telephone Encounter (Signed)
Rx benicar sent to pharmacy.

## 2018-11-06 NOTE — Telephone Encounter (Signed)
Rx benicar sent in since losartan not available.Dc losartan.

## 2018-11-10 ENCOUNTER — Telehealth: Payer: Self-pay | Admitting: Medical

## 2018-11-10 ENCOUNTER — Other Ambulatory Visit: Payer: Self-pay

## 2018-11-10 ENCOUNTER — Ambulatory Visit: Payer: Federal, State, Local not specified - PPO | Admitting: Medical

## 2018-11-10 ENCOUNTER — Encounter: Payer: Self-pay | Admitting: Medical

## 2018-11-10 VITALS — BP 134/79 | HR 65 | Temp 97.6°F | Resp 16 | Ht 65.0 in | Wt 149.4 lb

## 2018-11-10 DIAGNOSIS — R748 Abnormal levels of other serum enzymes: Secondary | ICD-10-CM | POA: Diagnosis not present

## 2018-11-10 DIAGNOSIS — I10 Essential (primary) hypertension: Secondary | ICD-10-CM | POA: Diagnosis not present

## 2018-11-10 DIAGNOSIS — R109 Unspecified abdominal pain: Secondary | ICD-10-CM | POA: Diagnosis not present

## 2018-11-10 DIAGNOSIS — R110 Nausea: Secondary | ICD-10-CM | POA: Diagnosis not present

## 2018-11-10 DIAGNOSIS — E876 Hypokalemia: Secondary | ICD-10-CM

## 2018-11-10 LAB — CBC WITH DIFFERENTIAL/PLATELET
Basophils Absolute: 0.1 10*3/uL (ref 0.0–0.1)
Basophils Relative: 3.5 % — ABNORMAL HIGH (ref 0.0–3.0)
Eosinophils Absolute: 0.2 10*3/uL (ref 0.0–0.7)
Eosinophils Relative: 4.3 % (ref 0.0–5.0)
HCT: 37.1 % (ref 36.0–46.0)
Hemoglobin: 12.8 g/dL (ref 12.0–15.0)
Lymphocytes Relative: 33.1 % (ref 12.0–46.0)
Lymphs Abs: 1.2 10*3/uL (ref 0.7–4.0)
MCHC: 34.4 g/dL (ref 30.0–36.0)
MCV: 98 fl (ref 78.0–100.0)
Monocytes Absolute: 0.6 10*3/uL (ref 0.1–1.0)
Monocytes Relative: 15.8 % — ABNORMAL HIGH (ref 3.0–12.0)
NEUTROS ABS: 1.5 10*3/uL (ref 1.4–7.7)
NEUTROS PCT: 43.3 % (ref 43.0–77.0)
PLATELETS: 481 10*3/uL — AB (ref 150.0–400.0)
RBC: 3.79 Mil/uL — ABNORMAL LOW (ref 3.87–5.11)
RDW: 14 % (ref 11.5–15.5)
WBC: 3.6 10*3/uL — ABNORMAL LOW (ref 4.0–10.5)

## 2018-11-10 LAB — COMPREHENSIVE METABOLIC PANEL
ALT: 23 U/L (ref 0–35)
AST: 36 U/L (ref 0–37)
Albumin: 3.8 g/dL (ref 3.5–5.2)
Alkaline Phosphatase: 64 U/L (ref 39–117)
BUN: 12 mg/dL (ref 6–23)
CO2: 36 mEq/L — ABNORMAL HIGH (ref 19–32)
Calcium: 9.6 mg/dL (ref 8.4–10.5)
Chloride: 95 mEq/L — ABNORMAL LOW (ref 96–112)
Creatinine, Ser: 0.57 mg/dL (ref 0.40–1.20)
GFR: 108.8 mL/min (ref >60.00)
GLUCOSE: 91 mg/dL (ref 70–99)
Potassium: 4.9 mEq/L (ref 3.5–5.1)
Sodium: 135 mEq/L (ref 135–145)
Total Bilirubin: 0.6 mg/dL (ref 0.2–1.2)
Total Protein: 6.5 g/dL (ref 6.0–8.3)

## 2018-11-10 LAB — AMYLASE: Amylase: 40 U/L (ref 27–131)

## 2018-11-10 LAB — LIPASE: Lipase: 43 U/L (ref 11.0–59.0)

## 2018-11-10 NOTE — Patient Instructions (Addendum)
Your blood pressure is well controlled today.  Continue just on the losartan 25 mg daily tablet.  Reminder regarding the HCTZ that is only for rare days of severe pedal edema.  If you do take 1 tablet HCTZ then make sure wearing your compression stockings and elevate your legs.  Not for daily use as in the past your potassium has been significantly low.  For recent abdomen pain, nausea and increased lipase level, we will repeat your amylase and lipase level as well as get a CBC.  Your lipase level was not very high and your CT abdomen showed normal-appearing pancreas.  Would recommend that you hydrate well as discussed and try to eat very little/rest your pancreas.  Particularly avoid any greasy and fatty foods.  Also very important not to drink any alcohol.(You do not any use but need to remind you as precaution.)  You already have established care with gastroenterologist.  They gave you appointment for May.  I would call their office and see if they are able to see you sooner than may/preferably this week.  In light of the covid virus situation I do think is better for you to be evaluated sooner rather than later.  Office appointments going forward will be probably more limited/restricted.  If you have any worsening signs and symptoms then I recommend ED evaluation.  Will prescribe 12 tablets of tramadol to help with the pain.  Also refill your Zofran.  Reminder that we did fax DMV paperwork to them but without filled out portion of neurologist paperwork.  We called their office for an update.  Follow-up date to be determined(difficult to say if or when you need to follow-up in light of the situation regarding the viral pandemic.)  Our office is trying to figure out a way we can take care of patient possibly via WebEx or phone calls.

## 2018-11-10 NOTE — Progress Notes (Signed)
Subjective:    Patient ID: Catherine Hubbard, female    DOB: [birth year], 58 y.o.   MRN: [id number]  HPI  Pt in for follow up.  Pt has hx of htn. BP is well controlled.   Pt has history of low potassium. I advised no further hctz use except for rare use of one hctz on occasion if she gets severe pedal edema. Pt understands this.(no daily use and I only rx'd 4 tabs)  Pt also had some recent esophagitis and clinical pancreatitis. ED note/ED course notes.  Work-up notable for clinical pancreatitis with elevated lipase.  Due to significant pain and recent esophageal dilatation, CT scan was obtained and did not reveal evidence of esophageal tear.  Did reveal evidence of esophagitis.  No pancreatic stranding or fluid collection.  No free air suspicious for posterior perforated peptic ulcer.  No leukocytosis.  Electrolyte similar to prior, no renal insufficiency.  LDH less than 350.  Patient treated with IV fluids and pain medicine.  Able to transition to oral pain medicine.  She was able to tolerate oral intake.   After several hours of observation, patient remained hemodynamically stable.  Felt she was appropriate for outpatient management with close GI follow-up.  CT report reads  FINDINGS: Lower chest: Lung bases demonstrate no acute consolidation or effusion. Heart size is normal. Postsurgical changes at the GE junction with evidence of small hiatal hernia. Distal esophageal thickening.  Hepatobiliary: No focal liver abnormality is seen. Status post cholecystectomy. No biliary dilatation.  Pancreas: Unremarkable. No pancreatic ductal dilatation or surrounding inflammatory changes.  Spleen: Normal in size without focal abnormality.  Adrenals/Urinary Tract: Adrenal glands are unremarkable. Kidneys are normal, without renal calculi, focal lesion, or hydronephrosis. Bladder is unremarkable.  Stomach/Bowel: Stomach is within normal limits. Appendix appears normal. No  evidence of bowel wall thickening, distention, or inflammatory changes. Large volume of stool in the colon. Diverticular disease of the sigmoid colon  Vascular/Lymphatic: Nonaneurysmal aorta. No significantly enlarged lymph nodes  Reproductive: Uterus contains scattered calcifications. Stable 3.5 cm right adnexal cyst. Stable 13 mm left adnexal cyst.  Other: Negative for free air or free fluid  Musculoskeletal: Chronic superior endplate compression fracture L1. No acute or suspicious abnormality. Old left pubic rami fractures.  IMPRESSION: 1. No CT evidence for acute intra-abdominal or pelvic abnormality. 2. Hiatal hernia. Mild distal esophageal thickening which may be secondary to reflux or esophagitis. 3. Stable adnexal cysts 4. Sigmoid colon diverticular disease without acute inflammatory change  Pt lipase is elevated. She is not drinking any alcohol.  Pt is using tramadol very sparingly.   Review of Systems  Constitutional: Negative for chills, fatigue and fever.  HENT: Negative for congestion, drooling, ear pain, mouth sores, postnasal drip, sinus pressure and sinus pain.   Respiratory: Negative for cough, chest tightness, shortness of breath and wheezing.   Cardiovascular: Negative for chest pain and palpitations.  Gastrointestinal: Positive for abdominal pain and nausea. Negative for abdominal distention and vomiting.       No vomiting. Just nausea. Pain less than was in ED.  Genitourinary: Negative for difficulty urinating and dysuria.  Musculoskeletal: Negative for back pain.  Skin: Negative for rash.  Hematological: Negative for adenopathy. Does not bruise/bleed easily.  Psychiatric/Behavioral: Negative for behavioral problems and decreased concentration.    Past Medical History:  Diagnosis Date  . Asthma   . Esophagitis   . GERD (gastroesophageal reflux disease)   . Headache   . Hearing loss   . Hypertension   .  Multiple sclerosis (HCC)   . Vision  abnormalities      Social History   Socioeconomic History  . Marital status: Married    Spouse name: Not on file  . Number of children: Not on file  . Years of education: Not on file  . Highest education level: Not on file  Occupational History  . Not on file  Social Needs  . Financial resource strain: Not on file  . Food insecurity:    Worry: Not on file    Inability: Not on file  . Transportation needs:    Medical: Not on file    Non-medical: Not on file  Tobacco Use  . Smoking status: Never Smoker  . Smokeless tobacco: Never Used  . Tobacco comment: second hand smoker when young. Heavy exposure.  Substance and Sexual Activity  . Alcohol use: Not Currently    Alcohol/week: 0.0 standard drinks    Comment: Sts. she drinks beer, liquor daily, varying amts/fim  . Drug use: No  . Sexual activity: Never  Lifestyle  . Physical activity:    Days per week: Not on file    Minutes per session: Not on file  . Stress: Not on file  Relationships  . Social connections:    Talks on phone: Not on file    Gets together: Not on file    Attends religious service: Not on file    Active member of club or organization: Not on file    Attends meetings of clubs or organizations: Not on file    Relationship status: Not on file  . Intimate partner violence:    Fear of current or ex partner: Not on file    Emotionally abused: Not on file    Physically abused: Not on file    Forced sexual activity: Not on file  Other Topics Concern  . Not on file  Social History Narrative  . Not on file    Past Surgical History:  Procedure Laterality Date  . ANKLE FRACTURE SURGERY Bilateral   . CARPAL TUNNEL RELEASE Bilateral   . COLONOSCOPY  11/2016   multiple  . ESOPHAGOGASTRODUODENOSCOPY  11/2016  . ESOPHAGOGASTRODUODENOSCOPY N/A 02/01/2017   Procedure: ESOPHAGOGASTRODUODENOSCOPY (EGD);  Surgeon: Iva Boop, MD;  [facility] ENDOSCOPY;  Service: Endoscopy;  Laterality: N/A;  .  ESOPHAGOGASTRODUODENOSCOPY (EGD) WITH PROPOFOL N/A 02/04/2017   Procedure: ESOPHAGOGASTRODUODENOSCOPY (EGD) WITH PROPOFOL;  Surgeon: Meryl Dare, MD;  Location: [facility] ENDOSCOPY;  Service: Endoscopy;  Laterality: N/A;  . LAPAROSCOPIC GASTRIC SLEEVE RESECTION  2011  . ULNAR NERVE REPAIR Bilateral     Family History  Problem Relation Age of Onset  . Cancer Mother   . Stroke Mother   . Cancer Father     Allergies  Allergen Reactions  . 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 Nsaids     Gastric bypass    Current Outpatient Medications on File Prior to Visit  Medication Sig Dispense Refill  . albuterol (PROVENTIL) (2.5 MG/3ML) 0.083% nebulizer solution Take 3 mLs (2.5 mg total) by nebulization every 6 (six) hours as needed for wheezing or shortness of breath. 150 mL 1  . baclofen (LIORESAL) 10 MG tablet Take 1 tablet (10 mg total) by mouth 3 (three) times daily. (Patient taking differently: Take 10 mg by mouth at bedtime. ) 90 each 05  . DULoxetine (CYMBALTA) 60 MG capsule Take 1 capsule (60 mg total) by mouth daily. 30 capsule 11  .  hydrochlorothiazide (HYDRODIURIL) 25 MG tablet 1 tab po daily as needed severe pedal edema. 4 tablet 0  . losartan (COZAAR) 25 MG tablet Take 1 tablet (25 mg total) by mouth daily. 30 tablet 0  . modafinil (PROVIGIL) 200 MG tablet TAKE 1 TABLET BY MOUTH EVERY DAY 30 tablet 0  . Multiple Vitamin (MULTIVITAMIN WITH MINERALS) TABS tablet Take 1 tablet by mouth daily.    Marland Kitchen ocrelizumab 600 mg in sodium chloride 0.9 % 500 mL Inject 600 mg into the vein every 6 (six) months. Last infusion May 2019 - due in December 2019    . olmesartan (BENICAR) 20 MG tablet Take 1 tablet (20 mg total) by mouth daily. 30 tablet 3  . omeprazole (PRILOSEC) 20 MG capsule Take 40 mg by mouth 2 (two) times daily.    Marland Kitchen oxybutynin (DITROPAN) 5 MG tablet Take 1 tablet (5 mg total) by mouth 2 (two) times daily. 60 tablet 5  . potassium chloride SA  (K-DUR,KLOR-CON) 20 MEQ tablet Take 1 tablet (20 mEq total) by mouth 2 (two) times daily. 10 tablet 0  . sucralfate (CARAFATE) 1 GM/10ML suspension Take 10 mLs (1 g total) by mouth 4 (four) times daily -  with meals and at bedtime. 420 mL 0   No current facility-administered medications on file prior to visit.     BP 134/79   Pulse 65   Temp 97.6 F (36.4 C) (Oral)   Resp 16   Ht 5\' 5"  (1.651 m)   Wt 149 lb 6.4 oz (67.8 kg)   LMP 08/13/2014 Comment: very few periods  SpO2 100%   BMI 24.86 kg/m       Objective:   Physical Exam  General Mental Status- Alert. General Appearance- Not in acute distress.   Skin General: Color- Normal Color. Moisture- Normal Moisture.  Neck Carotid Arteries- Normal color. Moisture- Normal Moisture. No carotid bruits. No JVD.  Chest and Lung Exam Auscultation: Breath Sounds:-Normal.  Cardiovascular Auscultation:Rythm- Regular. Murmurs & Other Heart Sounds:Auscultation of the heart reveals- No Murmurs.  Abdomen Inspection:-Inspeection Normal. Palpation/Percussion:Note:No mass. Palpation and Percussion of the abdomen reveal- Non Tender, Non Distended + BS, no rebound or guarding.  Neurologic Cranial Nerve exam:- CN III-XII intact(No nystagmus), symmetric smile. Strength:- 5/5 equal and symmetric strength both upper and lower extremities.      Assessment & Plan:  Your blood pressure is well controlled today.  Continue just on the losartan 25 mg daily tablet.  Reminder regarding the HCTZ that is only for rare days of severe pedal edema.  If you do take 1 tablet HCTZ then make sure wearing your compression stockings and elevate your legs.  Not for daily use as in the past your potassium has been significantly low.  For recent abdomen pain, nausea and increased lipase level, we will repeat your amylase and lipase level as well as get a CBC.  Your lipase level was not very high and your CT abdomen showed normal-appearing pancreas.  Would  recommend that you hydrate well as discussed and try to eat very little/rest your pancreas.  Particularly avoid any greasy and fatty foods.  Also very important not to drink any alcohol.(You do not any use but need to remind you as precaution.)  You already have established care with gastroenterologist.  They gave you appointment for May.  I would call their office and see if they are able to see you sooner than may/preferably this week.  In light of the covid virus situation I do  think is better for you to be evaluated sooner rather than later.  Office appointments going forward will be probably more limited/restricted.  If you have any worsening signs and symptoms then I recommend ED evaluation.  Will prescribe 12 tablets of tramadol to help with the pain.  Also refill your Zofran.  Reminder that we did fax DMV paperwork to them but without filled out portion of neurologist paperwork.  We called their office for an update.  Follow-up date to be determined.  Spent 40 minutes with pt. 50% of time spent counseling pt on plan going forward for her various conditions.  [name], PA-C

## 2018-11-10 NOTE — Telephone Encounter (Signed)
Open to review.  

## 2018-11-11 ENCOUNTER — Telehealth: Payer: Self-pay | Admitting: Medical

## 2018-11-11 MED ORDER — ONDANSETRON 8 MG PO TBDP: 8.0000 mg | ORAL_TABLET | Freq: Three times a day (TID) | ORAL | 0 refills | Status: DC | PRN

## 2018-11-11 NOTE — Telephone Encounter (Addendum)
I can refill patient zofran(rx sent in). . I think she was given this for pancreatitis in ED. Recent labs came back negative/improved for pancreatitis. I want her to take omeparzole twice daily to see if stomach pain decreases. I don't want to give narcotic to cover up abdomen pain. Want to know which meds might treat underying process.

## 2018-11-11 NOTE — Telephone Encounter (Signed)
Please advise 

## 2018-11-11 NOTE — Addendum Note (Signed)
Addended by: Gwenevere Abbot on: 11/11/2018 03:49 PM   Modules accepted: Orders

## 2018-11-11 NOTE — Telephone Encounter (Addendum)
Patient called requesting a refill on Tramadol 50mg  and Zofran 4mg .  Please send refill to [facility] [address] in [place].

## 2018-11-12 NOTE — Telephone Encounter (Signed)
Pt calling to see if a rx for  omeprazole (PRILOSEC) 20 MG capsule Can be sent to the pharmacy. Pt aware to take BID.  [id number] - [place], [place] - [address] [phone] (Phone) [phone] (Fax)   Pt aware no narcotics at this time

## 2018-11-13 DIAGNOSIS — Z86010 Personal history of colonic polyps: Secondary | ICD-10-CM | POA: Diagnosis not present

## 2018-11-13 DIAGNOSIS — R1010 Upper abdominal pain, unspecified: Secondary | ICD-10-CM | POA: Diagnosis not present

## 2018-11-13 DIAGNOSIS — K219 Gastro-esophageal reflux disease without esophagitis: Secondary | ICD-10-CM | POA: Diagnosis not present

## 2018-11-13 DIAGNOSIS — K222 Esophageal obstruction: Secondary | ICD-10-CM | POA: Diagnosis not present

## 2018-11-13 MED ORDER — OMEPRAZOLE 20 MG PO CPDR: 40.0000 mg | DELAYED_RELEASE_CAPSULE | Freq: Two times a day (BID) | ORAL | 2 refills | Status: DC

## 2018-11-13 NOTE — Addendum Note (Signed)
Addended by: Orlene Och on: 11/13/2018 09:51 AM   Modules accepted: Orders

## 2018-11-13 NOTE — Telephone Encounter (Signed)
Rx sent to pharmacy   

## 2018-11-16 ENCOUNTER — Inpatient Hospital Stay (HOSPITAL_COMMUNITY): Payer: Federal, State, Local not specified - PPO

## 2018-11-16 ENCOUNTER — Emergency Department (HOSPITAL_BASED_OUTPATIENT_CLINIC_OR_DEPARTMENT_OTHER): Payer: Federal, State, Local not specified - PPO

## 2018-11-16 ENCOUNTER — Other Ambulatory Visit: Payer: Self-pay

## 2018-11-16 ENCOUNTER — Other Ambulatory Visit (HOSPITAL_COMMUNITY): Payer: Self-pay

## 2018-11-16 ENCOUNTER — Encounter (HOSPITAL_BASED_OUTPATIENT_CLINIC_OR_DEPARTMENT_OTHER): Payer: Self-pay | Admitting: Emergency Medicine

## 2018-11-16 ENCOUNTER — Observation Stay (HOSPITAL_BASED_OUTPATIENT_CLINIC_OR_DEPARTMENT_OTHER)
Admission: EM | Admit: 2018-11-16 | Discharge: 2018-11-17 | Disposition: A | Discharge status: Home / Self Care | Payer: Federal, State, Local not specified - PPO | Attending: Internal Medicine | Admitting: Internal Medicine

## 2018-11-16 DIAGNOSIS — Z79899 Other long term (current) drug therapy: Secondary | ICD-10-CM | POA: Diagnosis not present

## 2018-11-16 DIAGNOSIS — G35A Relapsing-remitting multiple sclerosis: Secondary | ICD-10-CM | POA: Diagnosis present

## 2018-11-16 DIAGNOSIS — J45909 Unspecified asthma, uncomplicated: Secondary | ICD-10-CM | POA: Diagnosis not present

## 2018-11-16 DIAGNOSIS — N390 Urinary tract infection, site not specified: Secondary | ICD-10-CM

## 2018-11-16 DIAGNOSIS — M25552 Pain in left hip: Secondary | ICD-10-CM | POA: Diagnosis not present

## 2018-11-16 DIAGNOSIS — M549 Dorsalgia, unspecified: Secondary | ICD-10-CM

## 2018-11-16 DIAGNOSIS — Z9884 Bariatric surgery status: Secondary | ICD-10-CM | POA: Insufficient documentation

## 2018-11-16 DIAGNOSIS — I10 Essential (primary) hypertension: Secondary | ICD-10-CM | POA: Insufficient documentation

## 2018-11-16 DIAGNOSIS — R4781 Slurred speech: Secondary | ICD-10-CM | POA: Diagnosis not present

## 2018-11-16 DIAGNOSIS — K219 Gastro-esophageal reflux disease without esophagitis: Secondary | ICD-10-CM | POA: Diagnosis not present

## 2018-11-16 DIAGNOSIS — G35D Multiple sclerosis, unspecified: Secondary | ICD-10-CM | POA: Diagnosis present

## 2018-11-16 DIAGNOSIS — R29898 Other symptoms and signs involving the musculoskeletal system: Secondary | ICD-10-CM | POA: Diagnosis not present

## 2018-11-16 DIAGNOSIS — G35 Multiple sclerosis: Secondary | ICD-10-CM | POA: Diagnosis not present

## 2018-11-16 LAB — CREATININE, SERUM
Creatinine, Ser: 0.56 mg/dL (ref 0.44–1.00)
GFR calc Af Amer: >60 mL/min (ref >60)
GFR calc non Af Amer: >60 mL/min (ref >60)

## 2018-11-16 LAB — CBC WITH DIFFERENTIAL/PLATELET
Abs Immature Granulocytes: 0.01 10*3/uL (ref 0.00–0.07)
Basophils Absolute: 0.1 10*3/uL (ref 0.0–0.1)
Basophils Relative: 1 %
Eosinophils Absolute: 0.1 10*3/uL (ref 0.0–0.5)
Eosinophils Relative: 2 %
HCT: 35.8 % — ABNORMAL LOW (ref 36.0–46.0)
Hemoglobin: 11.7 g/dL — ABNORMAL LOW (ref 12.0–15.0)
Immature Granulocytes: 0 %
Lymphocytes Relative: 29 %
Lymphs Abs: 1.5 10*3/uL (ref 0.7–4.0)
MCH: 32.1 pg (ref 26.0–34.0)
MCHC: 32.7 g/dL (ref 30.0–36.0)
MCV: 98.1 fL (ref 80.0–100.0)
Monocytes Absolute: 0.5 10*3/uL (ref 0.1–1.0)
Monocytes Relative: 10 %
NEUTROS PCT: 58 %
Neutro Abs: 3 10*3/uL (ref 1.7–7.7)
Platelets: 420 10*3/uL — ABNORMAL HIGH (ref 150–400)
RBC: 3.65 MIL/uL — AB (ref 3.87–5.11)
RDW: 12.7 % (ref 11.5–15.5)
WBC: 5.1 10*3/uL (ref 4.0–10.5)
nRBC: 0 % (ref 0.0–0.2)

## 2018-11-16 LAB — BASIC METABOLIC PANEL
Anion gap: 8 (ref 5–15)
BUN: 16 mg/dL (ref 6–20)
CO2: 29 mmol/L (ref 22–32)
Calcium: 8.5 mg/dL — ABNORMAL LOW (ref 8.9–10.3)
Chloride: 99 mmol/L (ref 98–111)
Creatinine, Ser: 0.73 mg/dL (ref 0.44–1.00)
GFR calc Af Amer: >60 mL/min (ref >60)
GFR calc non Af Amer: >60 mL/min (ref >60)
Glucose, Bld: 100 mg/dL — ABNORMAL HIGH (ref 70–99)
Potassium: 3.6 mmol/L (ref 3.5–5.1)
SODIUM: 136 mmol/L (ref 135–145)

## 2018-11-16 LAB — CBC
HCT: 35.9 % — ABNORMAL LOW (ref 36.0–46.0)
HEMOGLOBIN: 12 g/dL (ref 12.0–15.0)
MCH: 32 pg (ref 26.0–34.0)
MCHC: 33.4 g/dL (ref 30.0–36.0)
MCV: 95.7 fL (ref 80.0–100.0)
Platelets: 421 10*3/uL — ABNORMAL HIGH (ref 150–400)
RBC: 3.75 MIL/uL — ABNORMAL LOW (ref 3.87–5.11)
RDW: 12.6 % (ref 11.5–15.5)
WBC: 5.5 10*3/uL (ref 4.0–10.5)
nRBC: 0 % (ref 0.0–0.2)

## 2018-11-16 LAB — URINALYSIS, ROUTINE W REFLEX MICROSCOPIC
Bilirubin Urine: NEGATIVE
Glucose, UA: NEGATIVE mg/dL
Ketones, ur: NEGATIVE mg/dL
Nitrite: POSITIVE — AB
Protein, ur: NEGATIVE mg/dL
Specific Gravity, Urine: <1.005 — ABNORMAL LOW (ref 1.005–1.030)
pH: 5.5 (ref 5.0–8.0)

## 2018-11-16 LAB — URINALYSIS, MICROSCOPIC (REFLEX)

## 2018-11-16 MED ORDER — HYDROCHLOROTHIAZIDE 25 MG PO TABS: 25.0000 mg | ORAL_TABLET | Freq: Every day | ORAL | Status: DC

## 2018-11-16 MED ORDER — SODIUM CHLORIDE 0.9 % IV SOLN
1.0000 g | INTRAVENOUS | Status: DC
  Administered 2018-11-16: 1 g via INTRAVENOUS
  Filled 2018-11-16 (×2): qty 10

## 2018-11-16 MED ORDER — ACETAMINOPHEN 325 MG PO TABS: 650.0000 mg | ORAL_TABLET | Freq: Four times a day (QID) | ORAL | Status: DC | PRN

## 2018-11-16 MED ORDER — ONDANSETRON 4 MG PO TBDP: 8.0000 mg | ORAL_TABLET | Freq: Three times a day (TID) | ORAL | Status: DC | PRN

## 2018-11-16 MED ORDER — ALBUTEROL SULFATE (2.5 MG/3ML) 0.083% IN NEBU: 2.5000 mg | INHALATION_SOLUTION | Freq: Four times a day (QID) | RESPIRATORY_TRACT | Status: DC | PRN

## 2018-11-16 MED ORDER — ENOXAPARIN SODIUM 40 MG/0.4ML ~~LOC~~ SOLN: 40.0000 mg | Freq: Every day | SUBCUTANEOUS | Status: DC

## 2018-11-16 MED ORDER — LOSARTAN POTASSIUM 25 MG PO TABS
25.0000 mg | ORAL_TABLET | Freq: Every day | ORAL | Status: DC
  Administered 2018-11-16 – 2018-11-17 (×2): 25 mg via ORAL
  Filled 2018-11-16 (×2): qty 1

## 2018-11-16 MED ORDER — TRAMADOL HCL 50 MG PO TABS
50.0000 mg | ORAL_TABLET | Freq: Four times a day (QID) | ORAL | Status: DC | PRN
  Administered 2018-11-16 – 2018-11-17 (×4): 50 mg via ORAL
  Filled 2018-11-16 (×4): qty 1

## 2018-11-16 MED ORDER — SODIUM CHLORIDE 0.9 % IV SOLN
INTRAVENOUS | Status: DC
  Administered 2018-11-16: 07:00:00 via INTRAVENOUS

## 2018-11-16 MED ORDER — OXYBUTYNIN CHLORIDE 5 MG PO TABS: 5.0000 mg | ORAL_TABLET | Freq: Two times a day (BID) | ORAL | Status: DC

## 2018-11-16 MED ORDER — GADOBUTROL 1 MMOL/ML IV SOLN
7.0000 mL | Freq: Once | INTRAVENOUS | Status: AC | PRN
  Administered 2018-11-16: 7 mL via INTRAVENOUS

## 2018-11-16 MED ORDER — POTASSIUM CHLORIDE CRYS ER 20 MEQ PO TBCR: 20.0000 meq | EXTENDED_RELEASE_TABLET | Freq: Two times a day (BID) | ORAL | Status: DC

## 2018-11-16 MED ORDER — SUCRALFATE 1 GM/10ML PO SUSP: 1.0000 g | Freq: Three times a day (TID) | ORAL | Status: DC

## 2018-11-16 MED ORDER — MODAFINIL 100 MG PO TABS
200.0000 mg | ORAL_TABLET | Freq: Every day | ORAL | Status: DC
  Filled 2018-11-16: qty 2

## 2018-11-16 MED ORDER — ADULT MULTIVITAMIN W/MINERALS CH
1.0000 | ORAL_TABLET | Freq: Every day | ORAL | Status: DC
  Administered 2018-11-16 – 2018-11-17 (×2): 1 via ORAL
  Filled 2018-11-16 (×2): qty 1

## 2018-11-16 MED ORDER — IBUPROFEN 200 MG PO TABS: 600.0000 mg | ORAL_TABLET | Freq: Four times a day (QID) | ORAL | Status: DC

## 2018-11-16 MED ORDER — PANTOPRAZOLE SODIUM 40 MG PO TBEC
40.0000 mg | DELAYED_RELEASE_TABLET | Freq: Every day | ORAL | Status: DC
  Administered 2018-11-16 – 2018-11-17 (×2): 40 mg via ORAL
  Filled 2018-11-16 (×2): qty 1

## 2018-11-16 MED ORDER — DULOXETINE HCL 60 MG PO CPEP
60.0000 mg | ORAL_CAPSULE | Freq: Every day | ORAL | Status: DC
  Administered 2018-11-16 – 2018-11-17 (×2): 60 mg via ORAL
  Filled 2018-11-16 (×2): qty 1

## 2018-11-16 MED ORDER — IRBESARTAN 75 MG PO TABS: 37.5000 mg | ORAL_TABLET | Freq: Every day | ORAL | Status: DC

## 2018-11-16 MED ORDER — BACLOFEN 10 MG PO TABS
10.0000 mg | ORAL_TABLET | Freq: Three times a day (TID) | ORAL | Status: DC
  Administered 2018-11-16 – 2018-11-17 (×3): 10 mg via ORAL
  Filled 2018-11-16 (×3): qty 1

## 2018-11-16 NOTE — ED Notes (Signed)
Assisted pt with bathroom, attempted to collect urine sample with hat. Pt missed hat, unable to collect sample.

## 2018-11-16 NOTE — Progress Notes (Signed)
SLP Cancellation Note  Patient Details Name: Catherine Hubbard MRN: [id number] DOB: 07/05/1961   Cancelled treatment:       Reason Eval/Treat Not Completed: Patient at procedure or test/unavailable. Pt care with CNA, will reattempt as schedule allows.  Rondel Baton, [place], CCC-SLP Speech-Language Pathologist Acute Rehabilitation Services Pager: [phone] Office: [phone]    Arlana Lindau 11/16/2018, 11:08 AM

## 2018-11-16 NOTE — Progress Notes (Signed)
SAME DAY PROGRESS NOTE  S: Patient seen and examined. No new complaints. Completed MRI of the brain, cervical spine and thoracic spine with and without contrast.  O: VSS Neurological exam She is alert awake oriented x3. Comfortably eating her sandwich for lunch.  Speech mildly dysarthric.   No aphasia. Cranial nerves: Pupils equal round react light, bilateral gaze nystagmus, face symmetric, facial sensation intact. Motor exam: Upper extremities are 5/5 bilaterally lower extremities are at least 4/5 bilaterally. 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  No enhancement.  Not much change from prior MRI. Cervical spine MRI shows extensive cervical spinal cord demyelinating lesions with no enhancement.  No comparison for C-spine MRIs. Thoracic spine MRI shows no involvement in terms of her MS.  No change from prior MRI.  No myelopathy.  A: Patient with a history of multiple sclerosis, on Ocrevus, admitted for true exacerbation versus pseudo-exacerbation in the setting of systemic infection. Imaging does not support acute exacerbation and it is likely that she has pseudo-progression-exacerbation of symptoms in the setting of systemic infection.  Recs: Continue treating the UTI. PT OT There is no indication versus steroid use at this time. She should follow-up with Dr. Epimenio Foot as outpatient in the next 4 to 6 weeks. Neurology will be available as needed Please call with questions  -- Milon Dikes, MD [facility] Pager: [phone] If 7pm to 7am, please call on call as listed on AMION.

## 2018-11-16 NOTE — ED Provider Notes (Addendum)
[facility]ENCY DEPT [facility] Provider Note: [name], MD, FACEP  CSN: [id number] MRN: [id number] ARRIVAL: 11/16/18 at 0307 ROOM: MHOTF/OTF   CHIEF COMPLAINT  Extremity Weakness   HISTORY OF PRESENT ILLNESS  11/16/18 3:29 AM Catherine Hubbard is a 58 y.o. female with multiple sclerosis.  She was here with increased difficulty speaking and increased lower extremity weakness that began yesterday about noon.  She has had difficulty walking and standing.  On several occasions she has fallen to the ground while attempting to stand or ambulate.  She denies injuring herself.  She states that she normally drags her feet but has never had these exact symptoms before.  She does not know what may have triggered this.   Past Medical History:  Diagnosis Date  . Asthma   . Esophagitis   . GERD (gastroesophageal reflux disease)   . Headache   . Hearing loss   . Hypertension   . Multiple sclerosis (HCC)   . Vision abnormalities     Past Surgical History:  Procedure Laterality Date  . ANKLE FRACTURE SURGERY Bilateral   . CARPAL TUNNEL RELEASE Bilateral   . COLONOSCOPY  11/2016   multiple  . ESOPHAGOGASTRODUODENOSCOPY  11/2016  . ESOPHAGOGASTRODUODENOSCOPY N/A 02/01/2017   Procedure: ESOPHAGOGASTRODUODENOSCOPY (EGD);  Surgeon: Iva Boop, MD;  Location: [facility] ENDOSCOPY;  Service: Endoscopy;  Laterality: N/A;  . ESOPHAGOGASTRODUODENOSCOPY (EGD) WITH PROPOFOL N/A 02/04/2017   Procedure: ESOPHAGOGASTRODUODENOSCOPY (EGD) WITH PROPOFOL;  Surgeon: Meryl Dare, MD;  Location: [facility] ENDOSCOPY;  Service: Endoscopy;  Laterality: N/A;  . LAPAROSCOPIC GASTRIC SLEEVE RESECTION  2011  . ULNAR NERVE REPAIR Bilateral     Family History  Problem Relation Age of Onset  . Cancer Mother   . Stroke Mother   . Cancer Father     Social History   Tobacco Use  . Smoking status: Never Smoker  . Smokeless tobacco: Never Used  . Tobacco comment: second hand smoker when young. Heavy exposure.   Substance Use Topics  . Alcohol use: Not Currently    Alcohol/week: 0.0 standard drinks    Comment: Sts. she drinks beer, liquor daily, varying amts/fim  . Drug use: No    Prior to Admission medications   Medication Sig Start Date End Date Taking? Authorizing Provider  amoxicillin-clavulanate (AUGMENTIN) 875-125 MG tablet  09/28/18  Yes [provider]  cefdinir (OMNICEF) 300 MG capsule  10/01/18  Yes [provider]  methylPREDNISolone (MEDROL DOSEPAK) 4 MG TBPK tablet  10/17/18  Yes [provider]  traMADol (ULTRAM) 50 MG tablet  11/02/18  Yes [provider]  albuterol (PROVENTIL) (2.5 MG/3ML) 0.083% nebulizer solution Take 3 mLs (2.5 mg total) by nebulization every 6 (six) hours as needed for wheezing or shortness of breath. 10/31/18   Saguier, Ramon Dredge, PA-C  baclofen (LIORESAL) 10 MG tablet Take 1 tablet (10 mg total) by mouth 3 (three) times daily. Patient taking differently: Take 10 mg by mouth at bedtime.  08/26/18   Sater, Pearletha Furl, MD  DULoxetine (CYMBALTA) 60 MG capsule Take 1 capsule (60 mg total) by mouth daily. 08/26/18   Sater, Pearletha Furl, MD  hydrochlorothiazide (HYDRODIURIL) 25 MG tablet 1 tab po daily as needed severe pedal edema. 10/31/18   Saguier, Ramon Dredge, PA-C  losartan (COZAAR) 25 MG tablet Take 1 tablet (25 mg total) by mouth daily. 10/31/18   Saguier, Ramon Dredge, PA-C  modafinil (PROVIGIL) 200 MG tablet TAKE 1 TABLET BY MOUTH EVERY DAY 11/03/18   Levert Feinstein, MD  Multiple  Vitamin (MULTIVITAMIN WITH MINERALS) TABS tablet Take 1 tablet by mouth daily.    [provider]  ocrelizumab 600 mg in sodium chloride 0.9 % 500 mL Inject 600 mg into the vein every 6 (six) months. Last infusion May 2019 - due in December 2019    [provider]  olmesartan (BENICAR) 20 MG tablet Take 1 tablet (20 mg total) by mouth daily. 11/06/18   Saguier, Ramon Dredge, PA-C  omeprazole (PRILOSEC) 20 MG capsule Take 2 capsules (40 mg total) by mouth 2 (two) times  daily. 11/13/18   Saguier, Ramon Dredge, PA-C  ondansetron (ZOFRAN ODT) 8 MG disintegrating tablet Take 1 tablet (8 mg total) by mouth every 8 (eight) hours as needed for nausea or vomiting. 11/11/18   Saguier, Ramon Dredge, PA-C  oxybutynin (DITROPAN) 5 MG tablet Take 1 tablet (5 mg total) by mouth 2 (two) times daily. 08/26/18   Sater, Pearletha Furl, MD  potassium chloride SA (K-DUR,KLOR-CON) 20 MEQ tablet Take 1 tablet (20 mEq total) by mouth 2 (two) times daily. 10/28/18   Palumbo, April, MD  sucralfate (CARAFATE) 1 GM/10ML suspension Take 10 mLs (1 g total) by mouth 4 (four) times daily -  with meals and at bedtime. 10/28/18   Palumbo, April, MD    Allergies Ibuprofen and Nsaids   REVIEW OF SYSTEMS  Negative except as noted here or in the History of Present Illness.   PHYSICAL EXAMINATION  Initial Vital Signs Blood pressure 121/83, pulse 77, temperature 97.8 F (36.6 C), temperature source Oral, resp. rate 16, height  (1.651 m), weight 68 kg, last menstrual period 08/13/2014, SpO2 97 %.  Examination General: Well-developed, well-nourished female in no acute distress; appearance consistent with age of record HENT: normocephalic; atraumatic Eyes: pupils equal, round and reactive to light; extraocular muscles intact Neck: supple Heart: regular rate and rhythm Lungs: clear to auscultation bilaterally Abdomen: soft; nondistended; nontender; bowel sounds present Extremities: No deformity; full range of motion; pulses normal; +1 edema of lower legs Neurologic: Awake, alert and oriented; dysarthria with slowness of speech; generalized but symmetric muscular weakness Skin: Warm and dry Psychiatric: Normal mood and affect   RESULTS  Summary of this visit's results, reviewed by myself:   EKG Interpretation  Date/Time:    Ventricular Rate:    PR Interval:    QRS Duration:   QT Interval:    QTC Calculation:   R Axis:     Text Interpretation:        Laboratory Studies: Results for orders  placed or performed during the hospital encounter of 11/16/18 (from the past 24 hour(s))  CBC with Differential/Platelet     Status: Abnormal   Collection Time: 11/16/18  3:48 AM  Result Value Ref Range   WBC 5.1 4.0 - 10.5 K/uL   RBC 3.65 (L) 3.87 - 5.11 MIL/uL   Hemoglobin 11.7 (L) 12.0 - 15.0 g/dL   HCT 40.9 (L) 81.1 - 91.4 %   MCV 98.1 80.0 - 100.0 fL   MCH 32.1 26.0 - 34.0 pg   MCHC 32.7 30.0 - 36.0 g/dL   RDW 78.2 95.6 - 21.3 %   Platelets 420 (H) 150 - 400 K/uL   nRBC 0.0 0.0 - 0.2 %   Neutrophils Relative % 58 %   Neutro Abs 3.0 1.7 - 7.7 K/uL   Lymphocytes Relative 29 %   Lymphs Abs 1.5 0.7 - 4.0 K/uL   Monocytes Relative 10 %   Monocytes Absolute 0.5 0.1 - 1.0 K/uL   Eosinophils  Relative 2 %   Eosinophils Absolute 0.1 0.0 - 0.5 K/uL   Basophils Relative 1 %   Basophils Absolute 0.1 0.0 - 0.1 K/uL   Immature Granulocytes 0 %   Abs Immature Granulocytes 0.01 0.00 - 0.07 K/uL  Basic metabolic panel     Status: Abnormal   Collection Time: 11/16/18  3:48 AM  Result Value Ref Range   Sodium 136 135 - 145 mmol/L   Potassium 3.6 3.5 - 5.1 mmol/L   Chloride 99 98 - 111 mmol/L   CO2 29 22 - 32 mmol/L   Glucose, Bld 100 (H) 70 - 99 mg/dL   BUN 16 6 - 20 mg/dL   Creatinine, Ser 8.18 0.44 - 1.00 mg/dL   Calcium 8.5 (L) 8.9 - 10.3 mg/dL   GFR calc non Af Amer >60 >60 mL/min   GFR calc Af Amer >60 >60 mL/min   Anion gap 8 5 - 15  Urinalysis, Routine w reflex microscopic     Status: Abnormal   Collection Time: 11/16/18  5:37 AM  Result Value Ref Range   Color, Urine COLORLESS (A) YELLOW   APPearance HAZY (A) CLEAR   Specific Gravity, Urine <1.005 (L) 1.005 - 1.030   pH 5.5 5.0 - 8.0   Glucose, UA NEGATIVE NEGATIVE mg/dL   Hgb urine dipstick SMALL (A) NEGATIVE   Bilirubin Urine NEGATIVE NEGATIVE   Ketones, ur NEGATIVE NEGATIVE mg/dL   Protein, ur NEGATIVE NEGATIVE mg/dL   Nitrite POSITIVE (A) NEGATIVE   Leukocytes,Ua LARGE (A) NEGATIVE  Urinalysis, Microscopic (reflex)      Status: Abnormal   Collection Time: 11/16/18  5:37 AM  Result Value Ref Range   RBC / HPF 0-5 0 - 5 RBC/hpf   WBC, UA 21-50 0 - 5 WBC/hpf   Bacteria, UA MANY (A) NONE SEEN   Squamous Epithelial / LPF 0-5 0 - 5   Imaging Studies: Dg Chest 2 View  Result Date: 11/16/2018 CLINICAL DATA:  Bilateral leg weakness EXAM: CHEST - 2 VIEW COMPARISON:  10/29/2018 FINDINGS: Lungs are clear.  No pleural effusion or pneumothorax. The heart is top-normal in size. Old bilateral rib fracture deformities. IMPRESSION: Normal chest radiographs. Electronically Signed   By: Charline Bills M.D.   On: 11/16/2018 04:41    ED COURSE and MDM  Nursing notes and initial vitals signs, including pulse oximetry, reviewed.  Vitals:   11/16/18 0325 11/16/18 0444 11/16/18 0536  BP: 121/83 115/75 121/76  Pulse: 77 80   Resp: 16 16   Temp: 97.8 F (36.6 C)    TempSrc: Oral    SpO2: 97% 100%   Weight: 68 kg    Height: 5\' 5"  (1.651 m)     4:23 AM Patient states she is not seeing double.  Discussed with Dr. Wilford Corner who recommends admission to the hospitalist service for presumed MS flare.  PROCEDURES    ED DIAGNOSES     ICD-10-CM   1. Multiple sclerosis exacerbation (HCC) G35   2. Lower urinary tract infectious disease N39.0        Anisten Tomassi, MD 11/16/18 0424    Paula Libra, MD 11/16/18 0446    Tiffany Talarico, Jonny Ruiz, MD 11/16/18 [id number]

## 2018-11-16 NOTE — Evaluation (Addendum)
Clinical/Bedside Swallow Evaluation Patient Details  Name: Catherine Hubbard MRN: [id number] Date of Birth: March 29, 1961  Today's Date: 11/16/2018 Time: SLP Start Time (ACUTE ONLY): 1122 SLP Stop Time (ACUTE ONLY): 1135 SLP Time Calculation (min) (ACUTE ONLY): 13 min  Past Medical History:  Past Medical History:  Diagnosis Date  . Asthma   . Esophagitis   . GERD (gastroesophageal reflux disease)   . Headache   . Hearing loss   . Hypertension   . Multiple sclerosis (HCC)   . Vision abnormalities    Past Surgical History:  Past Surgical History:  Procedure Laterality Date  . ANKLE FRACTURE SURGERY Bilateral   . CARPAL TUNNEL RELEASE Bilateral   . COLONOSCOPY  11/2016   multiple  . ESOPHAGOGASTRODUODENOSCOPY  11/2016  . ESOPHAGOGASTRODUODENOSCOPY N/A 02/01/2017   Procedure: ESOPHAGOGASTRODUODENOSCOPY (EGD);  Surgeon: Iva Boop, MD;  Location: [facility] ENDOSCOPY;  Service: Endoscopy;  Laterality: N/A;  . ESOPHAGOGASTRODUODENOSCOPY (EGD) WITH PROPOFOL N/A 02/04/2017   Procedure: ESOPHAGOGASTRODUODENOSCOPY (EGD) WITH PROPOFOL;  Surgeon: Meryl Dare, MD;  Location: [facility] ENDOSCOPY;  Service: Endoscopy;  Laterality: N/A;  . LAPAROSCOPIC GASTRIC SLEEVE RESECTION  2011  . ULNAR NERVE REPAIR Bilateral    HPI:  58 y.o. female with history of multiple sclerosis on Ocrevus for maintenance therapy, hypertension presented to [facility] after having increased weakness in both legs and difficulty walking since yesterday. Transferred to [facility] to obtain MRI brain, C and T-spine to evaluate whether she is having an MS exacerbation versus pseudo-exacerbation. Pt with PMHx of esophageal dysphagia (followed at [facility]), hiatal hernia, stricture with multiple dilations, grade D esophagitis, s/p gastric surgery. EGD 06/20/18 with benign intrinsic stricture at [facility] dilated to 30mm, and subsequent dilation to 106mm 08/07/18. Recent admission 2/6-2/9 possible acute tracheobronchitis.  CXR normal, MRI brain showed no progression of lesions since 2018.   Assessment / Plan / Recommendation Clinical Impression   Pt appears to have normal oropharyngeal swallow function and adequate airway protection based on bedside swallow assessment. Speech is mildly dysarthric (pt reports speech is 8/10, with 10 being her normal speech). Mastication, oral containment and clearance adequate for solids, swallow appears timely and there are no overt signs of aspiration with any consistency trialed. Pt able to swallow 3oz thin liquids consecutively via straw without difficulty. Given increased falls at home, mild dysarthria MD may wish to consider SLP cognitive-linguistic assessment to determine if SLP services may be beneficial post d/c. No further skilled ST needs identified for dysphagia. Recommend regular diet with thin liquids, meds whole with liquid. Pt remains at mild risk for aspiration given extensive esophageal history. Advise esophageal precautions.    SLP Visit Diagnosis: Dysphagia, unspecified (R13.10)    Aspiration Risk  Mild aspiration risk    Diet Recommendation Regular;Thin liquid   Liquid Administration via: Cup;Straw Medication Administration: Whole meds with liquid Supervision: Patient able to self feed Compensations: Slow rate;Small sips/bites;Follow solids with liquid Postural Changes: Seated upright at 90 degrees;Remain upright for at least 30 minutes after po intake    Other  Recommendations Oral Care Recommendations: Oral care BID   Follow up Recommendations None(consider ST cognitive-linguistic evaluation for dysarthria)      Frequency and Duration Other (Comment)(none for dysphagia)          Prognosis Prognosis for Safe Diet Advancement: Good      Swallow Study   General Date of Onset: 11/16/18 HPI: 58 y.o. female with history of multiple sclerosis on Ocrevus for maintenance therapy, hypertension presented  to [facility] after having increased  weakness in both legs and difficulty walking since yesterday. Transferred to [facility] to obtain MRI brain, C and T-spine to evaluate whether she is having an MS exacerbation versus pseudo-exacerbation. Pt with PMHx of esophageal dysphagia (followed at [facility]), hiatal hernia, stricture with multiple dilations, grade D esophagitis, s/p gastric surgery. EGD 06/20/18 with benign intrinsic stricture at [facility] dilated to 77mm, and subsequent dilation to 32mm 08/07/18. Recent admission 2/6-2/9 possible acute tracheobronchitis. CXR normal, MRI brain showed no progression of lesions since 2018. Type of Study: Bedside Swallow Evaluation Previous Swallow Assessment: see HPI Diet Prior to this Study: Thin liquids Temperature Spikes Noted: No Respiratory Status: Room air History of Recent Intubation: No Oral Cavity Assessment: Within Functional Limits Oral Care Completed by SLP: No Oral Cavity - Dentition: Adequate natural dentition Vision: Functional for self-feeding Self-Feeding Abilities: Able to feed self Patient Positioning: Upright in bed Baseline Vocal Quality: Normal(low pitch) Volitional Cough: Other (Comment)(did not test) Volitional Swallow: Able to elicit    Oral/Motor/Sensory Function Overall Oral Motor/Sensory Function: Within functional limits   Ice Chips Ice chips: Within functional limits Presentation: Spoon;Self Fed   Thin Liquid Thin Liquid: Within functional limits Presentation: Straw;Self Fed;Cup    Nectar Thick Nectar Thick Liquid: Not tested   Honey Thick Honey Thick Liquid: Not tested   Puree Puree: Within functional limits Presentation: Self Fed;Spoon   Solid     Solid: Within functional limits Presentation: Self Fed     Rondel Baton, MS, CCC-SLP Speech-Language Pathologist Acute Rehabilitation Services Pager: [phone] Office: [phone]  Arlana Lindau 11/16/2018,12:16 PM

## 2018-11-16 NOTE — Progress Notes (Signed)
Patient is not available at this time to have NIF performed. RT will check back at a later time.

## 2018-11-16 NOTE — ED Triage Notes (Signed)
Bilateral leg weakness starting tonight. Hx of MS. States she cannot walk but ambulates slowly with standby assistance.

## 2018-11-16 NOTE — Progress Notes (Signed)
NIF performed per physician's orders.    NIF -60

## 2018-11-16 NOTE — Consult Note (Signed)
Requesting Physician: Dr. Read Drivers    Chief Complaint: Increased weakness  History obtained from: Patient and Chart    HPI:                                                                                                                                       Catherine Hubbard is a 58 y.o. female with history of multiple sclerosis on Ocrevus for maintenance therapy, hypertension presents to [facility] after having increased weakness in both legs and difficulty walking since yesterday.  Normally can walk without support, with mild dragging of her feet but since yesterday afternoon, she has been having increased difficulty with falling to the ground multiple times while attempting to stand up and walk.  She also has noticed that she has double vision since yesterday.   She was diagnosed with MS in 2009 and has multiple lesions in the brain, cervical spine and thoracic spine.  She follows up with Dr. Epimenio Foot her last visit was on 3/10.  At the time of her visit she had no new symptoms.  He had planned an MRI brain and cervical spine to assess for disease progression.    Patient was transferred to [facility] to obtain MRI brain, C and T-spine to evaluate whether she is having an MS exacerbation versus pseudo-exacerbation.  Work-up in ED also revealed patient had a urinary tract infection   Past Medical History:  Diagnosis Date  . Asthma   . Esophagitis   . GERD (gastroesophageal reflux disease)   . Headache   . Hearing loss   . Hypertension   . Multiple sclerosis (HCC)   . Vision abnormalities     Past Surgical History:  Procedure Laterality Date  . ANKLE FRACTURE SURGERY Bilateral   . CARPAL TUNNEL RELEASE Bilateral   . COLONOSCOPY  11/2016   multiple  . ESOPHAGOGASTRODUODENOSCOPY  11/2016  . ESOPHAGOGASTRODUODENOSCOPY N/A 02/01/2017   Procedure: ESOPHAGOGASTRODUODENOSCOPY (EGD);  Surgeon: Iva Boop, MD;  Location: [facility] ENDOSCOPY;  Service: Endoscopy;   Laterality: N/A;  . ESOPHAGOGASTRODUODENOSCOPY (EGD) WITH PROPOFOL N/A 02/04/2017   Procedure: ESOPHAGOGASTRODUODENOSCOPY (EGD) WITH PROPOFOL;  Surgeon: Meryl Dare, MD;  Location: [facility] ENDOSCOPY;  Service: Endoscopy;  Laterality: N/A;  . LAPAROSCOPIC GASTRIC SLEEVE RESECTION  2011  . ULNAR NERVE REPAIR Bilateral     Family History  Problem Relation Age of Onset  . Cancer Mother   . Stroke Mother   . Cancer Father    Social History:  reports that she has never smoked. She has never used smokeless tobacco. She reports previous alcohol use. She reports that she does not use drugs.  Allergies:  Allergies  Allergen Reactions  . 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 Nsaids     Gastric bypass    Medications:  I reviewed home medications   ROS:                                                                                                                                     14 systems reviewed and negative except above    Examination:                                                                                                      General: Appears well-developed and well-nourished. Obese Psych: Affect appropriate to situation Eyes: No scleral injection HENT: No OP obstrucion Head: Normocephalic.  Cardiovascular: Normal rate and regular rhythm.  Respiratory: Effort normal and breath sounds normal to anterior ascultation GI: Soft.  No distension. There is no tenderness.  Skin: WDI   Neurological Examinatio Mental Status: Alert, oriented, thought content appropriate.  Speech fluent without evidence of aphasia. Mild dysarthria.  Able to follow 3 step commands without difficulty. Cranial Nerves: II: Visual fields grossly normal,  III,IV, VI: ptosis not present, extra-ocular motions intact bilaterally with horizontal end gaze  nystagmus, pupils equal, round, reactive to light and accommodation V,VII: smile symmetric, facial light touch sensation normal bilaterally VIII: hearing normal bilaterally IX,X: uvula rises symmetrically XI: bilateral shoulder shrug XII: midline tongue extension Motor: Right : Upper extremity   5/5    Left:     Upper extremity   5/5  Lower extremity   3+/5     Lower extremity   4/5 Tone and bulk:normal tone throughout; no atrophy noted Sensory: Pinprick and light touch intact throughout, bilaterally Deep Tendon Reflexes: slightly brisk patellar reflexes L>R Plantars: Right: downgoing   Left: downgoing Cerebellar: Impaired heel to shin in both legs Gait: normal gait and station     Lab Results: Basic Metabolic Panel: Recent Labs  Lab 11/10/18 1043 11/16/18 0348  NA 135 136  K 4.9 3.6  CL 95* 99  CO2 36* 29  GLUCOSE 91 100*  BUN 12 16  CREATININE 0.57 0.73  CALCIUM 9.6 8.5*    CBC: Recent Labs  Lab 11/10/18 1043 11/16/18 0348  WBC 3.6* 5.1  NEUTROABS 1.5 3.0  HGB 12.8 11.7*  HCT 37.1 35.8*  MCV 98.0 98.1  PLT 481.0* 420*    Coagulation Studies: No results for input(s): LABPROT, INR in the last 72 hours.  Imaging: Dg Chest 2 View  Result Date: 11/16/2018 CLINICAL DATA:  Bilateral leg weakness EXAM: CHEST - 2 VIEW COMPARISON:  10/29/2018 FINDINGS: Lungs are clear.  No pleural effusion  or pneumothorax. The heart is top-normal in size. Old bilateral rib fracture deformities. IMPRESSION: Normal chest radiographs. Electronically Signed   By: Charline Bills M.D.   On: 11/16/2018 04:41     I have reviewed the above imaging : Pending   ASSESSMENT AND PLAN  58 y.o. female with history of multiple sclerosis on Ocrevus for maintenance therapy, hypertension presents to [facility] after having increased weakness in both legs and difficulty walking since yesterday. Transferred to [facility] to obtain MRI brain, C and T-spine to evaluate whether  she is having an MS exacerbation versus pseudo-exacerbation.  Work-up in ED also revealed patient had a urinary tract infection.    MS exacerbation versus pseudo-exacerbation Urinary tract infection  Recommendations Obtain MRI brain C-spine and T-spine to look for enhancing lesions Hold steroids until MRI results, if positive for new enhancing lesions, then recommend IV Solu-Medrol 1 g daily to 5 days Continue to treat urinary tract infection, decision of antibiotic per admitting service. PT/OT    [facility] Pager Number [phone]

## 2018-11-16 NOTE — Progress Notes (Signed)
Patient performed a NIF of -60 with good effort and on first attempt.

## 2018-11-16 NOTE — ED Notes (Signed)
[facility] notified (Tammy) - patient ready for transport 

## 2018-11-16 NOTE — H&P (Addendum)
HPI  Catherine Hubbard HDQ:[id number] DOB: 11-27-1960 DOA: 11/16/2018  PCP: [name], PA-C   Chief Complaint: 58 year old  Dr. Denman George  HPI:  58 year old female with multiple sclerosis, HTN, bipolar, prior esophageal ulcer- Known history of gastric bypass 11 years ago Daily moderate to heavy drinker per her own admission  Recent admission 2/6-2/9 possible acute tracheobronchitis  Presents with overall weakness to med center [facility] was transferred over as a direct admit in a setting of probable UTI Neurology saw the patient recommended holding steroids Also recommended getting MRI brain  Neurology ordered NIF labs showed many bacteria positive nitrites.  Patient tells me that she has had 3 falls over the past day The last time she had falls like this was back in February when she was admitted as above She is having exquisite pain in both hips although I am not sure that this is bony in nature and I feel she just has bruising Patient is able to control bowel and bladder She also tells me that she has some abdominal pain which is pretty consistent with her previous UTIs just above the right now    ED Course:  Started on normal saline MRI of the back ordered to assess for lesions Neurology ordered NIF Speech therapy ordered Based on strict bedrest   Review of Systems:   Negative for fever, visual changes, sore throat, rash, new muscle aches, chest pain, SOB, dysuria, bleeding, n/v/abdominal pain.  Past Medical History:  Diagnosis Date  . Asthma   . Esophagitis   . GERD (gastroesophageal reflux disease)   . Headache   . Hearing loss   . Hypertension   . Multiple sclerosis (HCC)   . Vision abnormalities     Past Surgical History:  Procedure Laterality Date  . ANKLE FRACTURE SURGERY Bilateral   . CARPAL TUNNEL RELEASE Bilateral   . COLONOSCOPY  11/2016   multiple  . ESOPHAGOGASTRODUODENOSCOPY  11/2016  . ESOPHAGOGASTRODUODENOSCOPY N/A  02/01/2017   Procedure: ESOPHAGOGASTRODUODENOSCOPY (EGD);  Surgeon: Iva Boop, MD;  Location: [facility] ENDOSCOPY;  Service: Endoscopy;  Laterality: N/A;  . ESOPHAGOGASTRODUODENOSCOPY (EGD) WITH PROPOFOL N/A 02/04/2017   Procedure: ESOPHAGOGASTRODUODENOSCOPY (EGD) WITH PROPOFOL;  Surgeon: Meryl Dare, MD;  Location: [facility] ENDOSCOPY;  Service: Endoscopy;  Laterality: N/A;  . LAPAROSCOPIC GASTRIC SLEEVE RESECTION  2011  . ULNAR NERVE REPAIR Bilateral      reports that she has never smoked. She has never used smokeless tobacco. She reports previous alcohol use. She reports that she does not use drugs. Mobility: at baseline  Allergies  Allergen Reactions  . 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 Nsaids     Gastric bypass    Family History  Problem Relation Age of Onset  . Cancer Mother   . Stroke Mother   . Cancer Father      Prior to Admission medications   Medication Sig Start Date End Date Taking? Authorizing Provider  amoxicillin-clavulanate (AUGMENTIN) 875-125 MG tablet  09/28/18  Yes [provider]  cefdinir (OMNICEF) 300 MG capsule  10/01/18  Yes [provider]  methylPREDNISolone (MEDROL DOSEPAK) 4 MG TBPK tablet  10/17/18  Yes [provider]  traMADol (ULTRAM) 50 MG tablet  11/02/18  Yes [provider]  albuterol (PROVENTIL) (2.5 MG/3ML) 0.083% nebulizer solution Take 3 mLs (2.5 mg total) by nebulization every 6 (six) hours as needed for wheezing or shortness of breath. 10/31/18   Saguier, Ramon Dredge, PA-C  baclofen (LIORESAL) 10 MG tablet Take 1 tablet (10 mg total) by mouth 3 (three) times daily. Patient taking differently: Take 10 mg by mouth at bedtime.  08/26/18   Sater, Pearletha Furl, MD  DULoxetine (CYMBALTA) 60 MG capsule Take 1 capsule (60 mg total) by mouth daily. 08/26/18   Sater, Pearletha Furl, MD  hydrochlorothiazide (HYDRODIURIL) 25 MG tablet 1 tab po daily as needed severe pedal edema. 10/31/18   Saguier, Ramon Dredge,  PA-C  losartan (COZAAR) 25 MG tablet Take 1 tablet (25 mg total) by mouth daily. 10/31/18   Saguier, Ramon Dredge, PA-C  modafinil (PROVIGIL) 200 MG tablet TAKE 1 TABLET BY MOUTH EVERY DAY 11/03/18   Levert Feinstein, MD  Multiple Vitamin (MULTIVITAMIN WITH MINERALS) TABS tablet Take 1 tablet by mouth daily.    [provider]  ocrelizumab 600 mg in sodium chloride 0.9 % 500 mL Inject 600 mg into the vein every 6 (six) months. Last infusion May 2019 - due in December 2019    [provider]  olmesartan (BENICAR) 20 MG tablet Take 1 tablet (20 mg total) by mouth daily. 11/06/18   Saguier, Ramon Dredge, PA-C  omeprazole (PRILOSEC) 20 MG capsule Take 2 capsules (40 mg total) by mouth 2 (two) times daily. 11/13/18   Saguier, Ramon Dredge, PA-C  ondansetron (ZOFRAN ODT) 8 MG disintegrating tablet Take 1 tablet (8 mg total) by mouth every 8 (eight) hours as needed for nausea or vomiting. 11/11/18   Saguier, Ramon Dredge, PA-C  oxybutynin (DITROPAN) 5 MG tablet Take 1 tablet (5 mg total) by mouth 2 (two) times daily. 08/26/18   Sater, Pearletha Furl, MD  potassium chloride SA (K-DUR,KLOR-CON) 20 MEQ tablet Take 1 tablet (20 mEq total) by mouth 2 (two) times daily. 10/28/18   Palumbo, April, MD  sucralfate (CARAFATE) 1 GM/10ML suspension Take 10 mLs (1 g total) by mouth 4 (four) times daily -  with meals and at bedtime. 10/28/18   Palumbo, April, MD    Physical Exam:  Vitals:   11/16/18 0536 11/16/18 0623  BP: 121/76 131/87  Pulse:  86  Resp:  18  Temp:  98 F (36.7 C)  SpO2:  98%     Alert coherent pleasant able to raise hands above head EOMI NCAT no scleral icterus no pallor  GCS 15  Catherine Hubbard 1  S1-S2 no murmur rub or gallop  CTA B  Able to sit up but is somewhat weaker than her baslein  She does have some overlap of her second toe over her big toe, SLR neg, power 5/5,   She has no lower extremity edema  Reflexes are 4/5 knees  Sensory is grossly intact   I have personally reviewed following labs  and imaging studies  Labs:   White count 5.5 hemoglobin 12.0 platelets 421 creatinine 0.56  BUN/creatinine compared to 3/23 16/0.7  Imaging studies:   MRI of the spine is pending  Hip x-rays bilaterally are pending  Medical tests:   EKG independently reviewed: None performed  Test discussed with performing physician:  No  Decision to obtain old records:   Reviewed  Review and summation of old records:   Reviewed extensively  Assessment/Plan Exacerbation of MS Await MRI stat Continue NIF Steroids as per neurologist Clear liquid diet until seen by speech-nursing reports that she has been drinking liquids at med center [employer], [place] SLP input Cystitis Follow culture Empiric coverage at this time with ceftriaxone change ot cipro in am Probable asymptomatic bacteriuria in differential-she has a pure  wick and was not catheterized History of GOP status post procedure 11 years ago-unclear where Transition off of ibuprofen to Tylenol Tramadol for pain Follow B12 etc. etc. as outpatient if needed    Severity of Illness: The appropriate patient status for this patient is OBSERVATION. Observation status is judged to be reasonable and necessary in order to provide the required intensity of service to ensure the patient's safety. The patient's presenting symptoms, physical exam findings, and initial radiographic and laboratory data in the context of their medical condition is felt to place them at decreased risk for further clinical deterioration. Furthermore, it is anticipated that the patient will be medically stable for discharge from the hospital within 2 midnights of admission. The following factors support the patient status of observation.   " The patient's presenting symptoms include weak, ? UTI. " The physical exam findings include . " The initial radiographic and laboratory data are .      DVT prophylaxis:Lovenox Code Status: FUll COde Family Communication:  NONE Consults called: Neuro    Time spent: 55 minutes  Taneah Masri, MD  [facility] Direct contact: [phone] --Via amion app OR  --[email or web address]; password [email or web address]  7PM-7AM contact night coverage as above  11/16/2018, 7:24 AM

## 2018-11-17 ENCOUNTER — Telehealth: Payer: Self-pay | Admitting: Medical

## 2018-11-17 DIAGNOSIS — G35 Multiple sclerosis: Secondary | ICD-10-CM | POA: Diagnosis not present

## 2018-11-17 DIAGNOSIS — N390 Urinary tract infection, site not specified: Secondary | ICD-10-CM | POA: Diagnosis not present

## 2018-11-17 LAB — COMPREHENSIVE METABOLIC PANEL
ALT: 24 U/L (ref 0–44)
AST: 58 U/L — ABNORMAL HIGH (ref 15–41)
Albumin: 2.8 g/dL — ABNORMAL LOW (ref 3.5–5.0)
Alkaline Phosphatase: 67 U/L (ref 38–126)
Anion gap: 10 (ref 5–15)
BUN: 8 mg/dL (ref 6–20)
CO2: 28 mmol/L (ref 22–32)
Calcium: 8.4 mg/dL — ABNORMAL LOW (ref 8.9–10.3)
Chloride: 99 mmol/L (ref 98–111)
Creatinine, Ser: 0.55 mg/dL (ref 0.44–1.00)
GFR calc Af Amer: >60 mL/min (ref >60)
GFR calc non Af Amer: >60 mL/min (ref >60)
Glucose, Bld: 85 mg/dL (ref 70–99)
Potassium: 3.9 mmol/L (ref 3.5–5.1)
Sodium: 137 mmol/L (ref 135–145)
TOTAL PROTEIN: 5.3 g/dL — AB (ref 6.5–8.1)
Total Bilirubin: 1 mg/dL (ref 0.3–1.2)

## 2018-11-17 LAB — CBC
HCT: 34.7 % — ABNORMAL LOW (ref 36.0–46.0)
HEMOGLOBIN: 11.5 g/dL — AB (ref 12.0–15.0)
MCH: 31.9 pg (ref 26.0–34.0)
MCHC: 33.1 g/dL (ref 30.0–36.0)
MCV: 96.1 fL (ref 80.0–100.0)
Platelets: 389 10*3/uL (ref 150–400)
RBC: 3.61 MIL/uL — ABNORMAL LOW (ref 3.87–5.11)
RDW: 12.3 % (ref 11.5–15.5)
WBC: 6.6 10*3/uL (ref 4.0–10.5)
nRBC: 0 % (ref 0.0–0.2)

## 2018-11-17 MED ORDER — FOSFOMYCIN TROMETHAMINE 3 G PO PACK
3.0000 g | PACK | Freq: Once | ORAL | Status: AC
  Administered 2018-11-17: 3 g via ORAL
  Filled 2018-11-17: qty 3

## 2018-11-17 MED ORDER — SODIUM CHLORIDE 0.9 % IV SOLN
1.0000 g | INTRAVENOUS | Status: DC
  Administered 2018-11-17: 1 g via INTRAVENOUS
  Filled 2018-11-17: qty 10

## 2018-11-17 NOTE — Progress Notes (Signed)
NIF performed >-60

## 2018-11-17 NOTE — Evaluation (Signed)
Physical Therapy Evaluation Patient Details Name: Catherine Hubbard MRN: [id number] DOB: August 14, 1961 Today's Date: 11/17/2018   History of Present Illness  58 y.o. female with history of multiple sclerosis on Ocrevus for maintenance therapy, hypertension presents to [facility] after having increased weakness in both legs and difficulty walking since yesterday.  Normally can walk without support, with mild dragging of her feet but since yesterday afternoon, she has been having increased difficulty with falling to the ground multiple times while attempting to stand up and walk.  She also has noticed that she has double vision since yesterday. She was diagnosed with MS in 2009 and has multiple lesions in the brain, cervical spine and thoracic spine.  Clinical Impression  Pt admitted with above diagnosis. Pt currently with functional limitations due to the deficits listed below (see PT Problem List). Pt limited in safe mobility by decreased strength proximal to distal, associated decreased balance, and decreased endurance. Pt mod I for bed mobility, supervision for transfer to RW and hands on min guard for ambulation of 60 feet with RW. Pt will benefit from skilled PT acutely to increase their independence and safety. PT provided pt with HEP below. With adherence to HEP PT does not feel pt requires additional PT at d/c. Access Code: [facility]  URL: [email or web address]  Date: 11/17/2018  Prepared by: Verna Czech Fleet   Exercises  Supine Bridge - 10 reps - 3 sets - 1x daily - 7x weekly  Clamshell - 10 reps - 3 sets - 1x daily - 7x weekly  Sidelying Hip Abduction - 10 reps - 3 sets - 1x daily - 7x weekly  Sit to Stand with Arms Crossed - 10 reps - 3 sets - 1x daily - 7x weekly  Supine March - 10 reps - 3 sets - 1x daily - 7x weekly      Follow Up Recommendations No PT follow up;Supervision/Assistance - 24 hour    Equipment Recommendations  None recommended by PT        Precautions / Restrictions Precautions Precautions: Fall Precaution Comments: falling prior to admittance Restrictions Weight Bearing Restrictions: No      Mobility  Bed Mobility Overal bed mobility: Needs Assistance Bed Mobility: Supine to Sit     Supine to sit: Supervision     General bed mobility comments: supervision for safety, HOB elevated, use of bedrail  Transfers Overall transfer level: Needs assistance Equipment used: Rolling walker (2 wheeled) Transfers: Sit to/from Stand Sit to Stand: Supervision         General transfer comment: supervision for safety, good power,   Ambulation/Gait Ambulation/Gait assistance: Min guard Gait Distance (Feet): 60 Feet Assistive device: Rolling walker (2 wheeled) Gait Pattern/deviations: Step-through pattern;Decreased step length - right;Decreased step length - left;Shuffle;Trunk flexed Gait velocity: slowed Gait velocity interpretation: <1.8 ft/sec, indicate of risk for recurrent falls General Gait Details: hands on min guard for safety, no LoB, no knee buckling, pt easily fatigues and velocity decreased with distance         Balance Overall balance assessment: Needs assistance Sitting-balance support: No upper extremity supported;Feet supported Sitting balance-Leahy Scale: Good     Standing balance support: Bilateral upper extremity supported;Single extremity supported Standing balance-Leahy Scale: Fair Standing balance comment: able to static stand, however increased stability with UE support on RW                             Pertinent Vitals/Pain Pain  Assessment: 0-10 Pain Score: 8  Pain Location: right shoulder and abdomen Pain Descriptors / Indicators: Aching;Sore;Sharp;Shooting Pain Intervention(s): Limited activity within patient's tolerance;Monitored during session;Repositioned    Home Living Family/patient expects to be discharged to:: Private residence Living Arrangements:  Spouse/significant other;Other (Comment)(co-worker and son) Available Help at Discharge: Family;Available 24 hours/day Type of Home: House Home Access: Stairs to enter Entrance Stairs-Rails: None Entrance Stairs-Number of Steps: 2 Home Layout: One level Home Equipment: Walker - 2 wheels      Prior Function Level of Independence: Independent         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 from deficits related to MS        Extremity/Trunk Assessment   Upper Extremity Assessment Upper Extremity Assessment: Defer to OT evaluation    Lower Extremity Assessment Lower Extremity Assessment: RLE deficits/detail;LLE deficits/detail RLE Deficits / Details: ROM WFL, hip strength 4-/5, knee flex 4-/5, knee ext 4/5, ankle dorsi/plantar 4/5  RLE Sensation: WNL RLE Coordination: WNL LLE Deficits / Details: ROM WFL, hip strength 4-/5, knee flex 4-/5, knee ext 4/5, ankle dorsi/plantar 4/5  LLE Sensation: WNL LLE Coordination: WNL    Cervical / Trunk Assessment Cervical / Trunk Assessment: Normal  Communication   Communication: No difficulties  Cognition Arousal/Alertness: Awake/alert Behavior During Therapy: WFL for tasks assessed/performed Overall Cognitive Status: Within Functional Limits for tasks assessed                                           Exercises  HEP provided    Assessment/Plan    PT Assessment Patient needs continued PT services  PT Problem List Decreased strength;Decreased activity tolerance;Decreased balance;Decreased mobility;Pain       PT Treatment Interventions DME instruction;Gait training;Stair training;Functional mobility training;Therapeutic activities;Therapeutic exercise;Balance training;Cognitive remediation;Patient/family education;Neuromuscular re-education    PT Goals (Current goals can be found in the Care Plan section)  Acute Rehab PT Goals Patient Stated Goal: get strength back   PT Goal Formulation: With patient Time For Goal Achievement: 12/01/18 Potential to Achieve Goals: Fair    Frequency Min 4X/week    AM-PAC PT "6 Clicks" Mobility  Outcome Measure Help needed turning from your back to your side while in a flat bed without using bedrails?: None Help needed moving from lying on your back to sitting on the side of a flat bed without using bedrails?: None Help needed moving to and from a bed to a chair (including a wheelchair)?: A Little Help needed standing up from a chair using your arms (e.g., wheelchair or bedside chair)?: A Little Help needed to walk in hospital room?: A Little Help needed climbing 3-5 steps with a railing? : A Lot 6 Click Score: 19    End of Session Equipment Utilized During Treatment: Gait belt Activity Tolerance: Patient limited by fatigue Patient left: in bed;with call bell/phone within reach;with bed alarm set Nurse Communication: Mobility status;Precautions;Weight bearing status PT Visit Diagnosis: Unsteadiness on feet (R26.81);Other abnormalities of gait and mobility (R26.89);Muscle weakness (generalized) (M62.81);Repeated falls (R29.6);History of falling (Z91.81);Difficulty in walking, not elsewhere classified (R26.2);Pain Pain - Right/Left: Left Pain - part of body: Shoulder(and abdomen)    Time: 0488-8916 PT Time Calculation (min) (ACUTE ONLY): 21 min   Charges:   PT Evaluation $PT Eval Moderate Complexity: 1 Mod          Ltanya Bayley B. Beverely Risen PT, DPT Acute  Rehabilitation Services Pager [phone] Office [phone]   Elon Alas Fleet 11/17/2018, 1:39 PM

## 2018-11-17 NOTE — Discharge Instructions (Signed)
Follow with Primary MD Saguier, Ramon Dredge, PA-C in 7 days   Get CBC, CMP,checked  by Primary MD next visit.    Activity: As tolerated with Full fall precautions use walker/cane & assistance as needed   Disposition Home    Diet: Regular diet , with feeding assistance and aspiration precautions.  For Heart failure patients - Check your Weight same time everyday, if you gain over 2 pounds, or you develop in leg swelling, experience more shortness of breath or chest pain, call your Primary MD immediately. Follow Cardiac Low Salt Diet and 1.5 lit/day fluid restriction.   On your next visit with your primary care physician please Get Medicines reviewed and adjusted.   Please request your Prim.MD to go over all Hospital Tests and Procedure/Radiological results at the follow up, please get all Hospital records sent to your Prim MD by signing hospital release before you go home.   If you experience worsening of your admission symptoms, develop shortness of breath, life threatening emergency, suicidal or homicidal thoughts you must seek medical attention immediately by calling 911 or calling your MD immediately  if symptoms less severe.  You Must read complete instructions/literature along with all the possible adverse reactions/side effects for all the Medicines you take and that have been prescribed to you. Take any new Medicines after you have completely understood and accpet all the possible adverse reactions/side effects.   Do not drive, operating heavy machinery, perform activities at heights, swimming or participation in water activities or provide baby sitting services if your were admitted for syncope or siezures until you have seen by Primary MD or a Neurologist and advised to do so again.  Do not drive when taking Pain medications.    Do not take more than prescribed Pain, Sleep and Anxiety Medications  Special Instructions: If you have smoked or chewed Tobacco  in the last 2 yrs  please stop smoking, stop any regular Alcohol  and or any Recreational drug use.  Wear Seat belts while driving.   Please note  You were cared for by a hospitalist during your hospital stay. If you have any questions about your discharge medications or the care you received while you were in the hospital after you are discharged, you can call the unit and asked to speak with the hospitalist on call if the hospitalist that took care of you is not available. Once you are discharged, your primary care physician will handle any further medical issues. Please note that NO REFILLS for any discharge medications will be authorized once you are discharged, as it is imperative that you return to your primary care physician (or establish a relationship with a primary care physician if you do not have one) for your aftercare needs so that they can reassess your need for medications and monitor your lab values.

## 2018-11-17 NOTE — Evaluation (Signed)
Occupational Therapy Evaluation Patient Details Name: Catherine Hubbard MRN: [id number] DOB: 1961-05-06 Today's Date: 11/17/2018    History of Present Illness 58 y.o. female with history of multiple sclerosis on Ocrevus for maintenance therapy, hypertension presents to Med [facility] after having increased weakness in both legs and difficulty walking since yesterday.  Normally can walk without support, with mild dragging of her feet but since yesterday afternoon, she has been having increased difficulty with falling to the ground multiple times while attempting to stand up and walk.  She also has noticed that she has double vision since yesterday. She was diagnosed with MS in 2009 and has multiple lesions in the brain, cervical spine and thoracic spine.   Clinical Impression   Patient evaluated by Occupational Therapy with no further acute OT needs identified. All education has been completed and the patient has no further questions. See below for any follow-up Occupational Therapy or equipment needs. OT to sign off. Thank you for referral.      Follow Up Recommendations  No OT follow up    Equipment Recommendations  None recommended by OT    Recommendations for Other Services       Precautions / Restrictions Precautions Precautions: Fall Precaution Comments: falls prior to admission/ pt states "i have bruises everywhere" Restrictions Weight Bearing Restrictions: No      Mobility Bed Mobility Overal bed mobility: Modified Independent                Transfers Overall transfer level: Needs assistance   Transfers: Sit to/from Stand Sit to Stand: Supervision         General transfer comment: supervision for safety, good power,     Balance Overall balance assessment: Needs assistance Sitting-balance support: No upper extremity supported;Feet supported Sitting balance-Leahy Scale: Good     Standing balance support: Bilateral upper extremity  supported;Single extremity supported Standing balance-Leahy Scale: Fair Standing balance comment: requires bil Ue support at sink level                            ADL either performed or assessed with clinical judgement   ADL Overall ADL's : Modified independent                                             Vision Baseline Vision/History: Wears glasses Wears Glasses: Reading only Patient Visual Report: (diplopia when tired and denies now) Additional Comments: No diplopia noted with scanning assessment this session     Perception     Praxis      Pertinent Vitals/Pain Pain Location: right shoulder and abdomen Pain Descriptors / Indicators: Aching;Sore;Sharp;Shooting     Hand Dominance Right   Extremity/Trunk Assessment Upper Extremity Assessment Upper Extremity Assessment: Overall WFL for tasks assessed       Cervical / Trunk Assessment Cervical / Trunk Assessment: Normal   Communication Communication Communication: No difficulties   Cognition Arousal/Alertness: Awake/alert Behavior During Therapy: WFL for tasks assessed/performed Overall Cognitive Status: Within Functional Limits for tasks assessed                                     General Comments       Exercises     Shoulder Instructions  Home Living Family/patient expects to be discharged to:: Private residence Living Arrangements: Spouse/significant other;Other (Comment)(co-worker and son) Available Help at Discharge: Family;Available 24 hours/day Type of Home: House Home Access: Stairs to enter [employer] of Steps: 2 Entrance Stairs-Rails: None Home Layout: One level     Bathroom Shower/Tub: IT trainer: Standard Bathroom Accessibility: Yes   Home Equipment: Environmental consultant - 2 wheels   Additional Comments: (wears readers for vision and reports diplopia with fatigue)      Prior Functioning/Environment Level  of Independence: Independent        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 from deficits related to MS        OT Problem List:        OT Treatment/Interventions:      OT Goals(Current goals can be found in the care plan section) Acute Rehab OT Goals Patient Stated Goal: to go home  OT Frequency:     Barriers to D/C:            Co-evaluation              AM-PAC OT "6 Clicks" Daily Activity     Outcome Measure Help from another person eating meals?: None Help from another person taking care of personal grooming?: None Help from another person toileting, which includes using toliet, bedpan, or urinal?: None Help from another person bathing (including washing, rinsing, drying)?: None Help from another person to put on and taking off regular upper body clothing?: None Help from another person to put on and taking off regular lower body clothing?: None 6 Click Score: 24   End of Session Nurse Communication: Mobility status;Precautions  Activity Tolerance: Patient tolerated treatment well Patient left: in bed;with call bell/phone within reach;with nursing/sitter in room  OT Visit Diagnosis: Unsteadiness on feet (R26.81)                Time: 1405-1430 OT Time Calculation (min): 25 min Charges:  OT General Charges $OT Visit: 1 Visit OT Evaluation $OT Eval Moderate Complexity: 1 Mod   Mateo Flow, OTR/L  Acute Rehabilitation Services Pager: [phone] Office: [phone] .   Mateo Flow 11/17/2018, 3:42 PM

## 2018-11-17 NOTE — Discharge Summary (Signed)
Catherine Hubbard, is a 58 y.o. female  DOB [birth year]  MRN [id number].  Admission date:  11/16/2018  Admitting Physician  John Giovanni, MD  Discharge Date:  11/17/2018   Primary MD  Saguier, Ramon Dredge, PA-C  Recommendations for primary care physician for things to follow:  -Please check CBC, BMP during next visit -Low on the final results of drug sensitivity on the urine cultures  Admission Diagnosis  Lower urinary tract infectious disease [N39.0] Multiple sclerosis exacerbation (HCC) [G35]   Discharge Diagnosis  Lower urinary tract infectious disease [N39.0] Multiple sclerosis exacerbation (HCC) [G35]    Active Problems:   Multiple sclerosis (HCC)   Multiple sclerosis exacerbation (HCC)   Exacerbation of multiple sclerosis (HCC)      Past Medical History:  Diagnosis Date   Asthma    Esophagitis    GERD (gastroesophageal reflux disease)    Headache    Hearing loss    Hypertension    Multiple sclerosis (HCC)    Vision abnormalities     Past Surgical History:  Procedure Laterality Date   ANKLE FRACTURE SURGERY Bilateral    CARPAL TUNNEL RELEASE Bilateral    COLONOSCOPY  11/2016   multiple   ESOPHAGOGASTRODUODENOSCOPY  11/2016   ESOPHAGOGASTRODUODENOSCOPY N/A 02/01/2017   Procedure: ESOPHAGOGASTRODUODENOSCOPY (EGD);  Surgeon: Iva Boop, MD;  Location: [facility] ENDOSCOPY;  Service: Endoscopy;  Laterality: N/A;   ESOPHAGOGASTRODUODENOSCOPY (EGD) WITH PROPOFOL N/A 02/04/2017   Procedure: ESOPHAGOGASTRODUODENOSCOPY (EGD) WITH PROPOFOL;  Surgeon: Meryl Dare, MD;  Location: [facility] ENDOSCOPY;  Service: Endoscopy;  Laterality: N/A;   LAPAROSCOPIC GASTRIC SLEEVE RESECTION  2011   ULNAR NERVE REPAIR Bilateral        History of present illness and  Hospital Course:     Kindly see H&P for history of present illness and admission details, please review complete Labs,  Consult reports and Test reports for all details in brief  HPI  from the history and physical done on the day of admission 11/16/2018   HPI:  58 year old female with multiple sclerosis, HTN, bipolar, prior esophageal ulcer- Known history of gastric bypass 11 years ago Daily moderate to heavy drinker per her own admission  Recent admission 2/6-2/9 possible acute tracheobronchitis  Presents with overall weakness to med center [facility] was transferred over as a direct admit in a setting of probable UTI Neurology saw the patient recommended holding steroids Also recommended getting MRI brain  Neurology ordered NIF labs showed many bacteria positive nitrites.  Patient tells me that she has had 3 falls over the past day The last time she had falls like this was back in February when she was admitted as above She is having exquisite pain in both hips although I am not sure that this is bony in nature and I feel she just has bruising Patient is able to control bowel and bladder She also tells me that she has some abdominal pain which is pretty consistent with her previous UTIs just above the right now    Hospital Course  UTI -Reports dysuria, she is afebrile, leukocytosis, has positive UA, urine culture growing Citrobacter, sensitivity pending at time of discharge, but she did receive 2 doses of IV Rocephin, and fosfomycin prior to discharge which should be sufficient, she is afebrile, with no leukocytosis.  MS -Presents with worsening weakness, thought initially secondary to MS exacerbation, neurology input greatly appreciated, her weakness is more related to infection from UTI, and not true MS exacerbation, especially in the setting with no evidence of enhancement on her MRI, indication for steroids   Discharge Condition:  stable   Follow UP  Follow-up Information    Saguier, Ramon Dredge, PA-C Follow up in 1 week(s).   Specialties:  Internal Medicine, Family Medicine Contact  information: [address] [place] [address] [phone]             Discharge Instructions  and  Discharge Medications    Discharge Instructions    Discharge instructions   Complete by:  As directed    Follow with Primary MD Saguier, Ramon Dredge, PA-C in 7 days   Get CBC, CMP,checked  by Primary MD next visit.    Activity: As tolerated with Full fall precautions use walker/cane & assistance as needed   Disposition Home    Diet: Regular diet , with feeding assistance and aspiration precautions.  For Heart failure patients - Check your Weight same time everyday, if you gain over 2 pounds, or you develop in leg swelling, experience more shortness of breath or chest pain, call your Primary MD immediately. Follow Cardiac Low Salt Diet and 1.5 lit/day fluid restriction.   On your next visit with your primary care physician please Get Medicines reviewed and adjusted.   Please request your Prim.MD to go over all Hospital Tests and Procedure/Radiological results at the follow up, please get all Hospital records sent to your Prim MD by signing hospital release before you go home.   If you experience worsening of your admission symptoms, develop shortness of breath, life threatening emergency, suicidal or homicidal thoughts you must seek medical attention immediately by calling 911 or calling your MD immediately  if symptoms less severe.  You Must read complete instructions/literature along with all the possible adverse reactions/side effects for all the Medicines you take and that have been prescribed to you. Take any new Medicines after you have completely understood and accpet all the possible adverse reactions/side effects.   Do not drive, operating heavy machinery, perform activities at heights, swimming or participation in water activities or provide baby sitting services if your were admitted for syncope or siezures until you have seen by Primary MD or a  Neurologist and advised to do so again.  Do not drive when taking Pain medications.    Do not take more than prescribed Pain, Sleep and Anxiety Medications  Special Instructions: If you have smoked or chewed Tobacco  in the last 2 yrs please stop smoking, stop any regular Alcohol  and or any Recreational drug use.  Wear Seat belts while driving.   Please note  You were cared for by a hospitalist during your hospital stay. If you have any questions about your discharge medications or the care you received while you were in the hospital after you are discharged, you can call the unit and asked to speak with the hospitalist on call if the hospitalist that took care of you is not available. Once you are discharged, your primary care physician will handle any further medical issues. Please note  that NO REFILLS for any discharge medications will be authorized once you are discharged, as it is imperative that you return to your primary care physician (or establish a relationship with a primary care physician if you do not have one) for your aftercare needs so that they can reassess your need for medications and monitor your lab values.   Increase activity slowly   Complete by:  As directed      Allergies as of 11/17/2018      Reactions   Ibuprofen Other (See Comments)   Cannot take due to gastric bypass   Nsaids    Gastric bypass      Medication List    TAKE these medications   albuterol (2.5 MG/3ML) 0.083% nebulizer solution Commonly known as:  PROVENTIL Take 3 mLs (2.5 mg total) by nebulization every 6 (six) hours as needed for wheezing or shortness of breath.   baclofen 10 MG tablet Commonly known as:  LIORESAL Take 1 tablet (10 mg total) by mouth 3 (three) times daily. What changed:  when to take this   DULoxetine 60 MG capsule Commonly known as:  Cymbalta Take 1 capsule (60 mg total) by mouth daily.   hydrochlorothiazide 25 MG tablet Commonly known as:  HYDRODIURIL 1 tab po  daily as needed severe pedal edema.   losartan 25 MG tablet Commonly known as:  COZAAR Take 1 tablet (25 mg total) by mouth daily.   modafinil 200 MG tablet Commonly known as:  PROVIGIL TAKE 1 TABLET BY MOUTH EVERY DAY What changed:    when to take this  reasons to take this   multivitamin with minerals Tabs tablet Take 1 tablet by mouth daily.   ocrelizumab 600 mg in sodium chloride 0.9 % 500 mL Inject 600 mg into the vein every 6 (six) months. Last infusion May 2019 - due in December 2019   olmesartan 20 MG tablet Commonly known as:  BENICAR Take 1 tablet (20 mg total) by mouth daily.   omeprazole 20 MG capsule Commonly known as:  PRILOSEC Take 2 capsules (40 mg total) by mouth 2 (two) times daily.   ondansetron 8 MG disintegrating tablet Commonly known as:  Zofran ODT Take 1 tablet (8 mg total) by mouth every 8 (eight) hours as needed for nausea or vomiting.   oxybutynin 5 MG tablet Commonly known as:  DITROPAN Take 1 tablet (5 mg total) by mouth 2 (two) times daily.   potassium chloride SA 20 MEQ tablet Commonly known as:  K-DUR,KLOR-CON Take 1 tablet (20 mEq total) by mouth 2 (two) times daily.   sucralfate 1 GM/10ML suspension Commonly known as:  Carafate Take 10 mLs (1 g total) by mouth 4 (four) times daily -  with meals and at bedtime.   traMADol 50 MG tablet Commonly known as:  ULTRAM Take 25 mg by mouth every 6 (six) hours as needed for severe pain.         Diet and Activity recommendation: See Discharge Instructions above   Consults obtained -  neurology   Major procedures and Radiology Reports - PLEASE review detailed and final reports for all details, in brief -      Dg Chest 2 View  Result Date: 11/16/2018 CLINICAL DATA:  Bilateral leg weakness EXAM: CHEST - 2 VIEW COMPARISON:  10/29/2018 FINDINGS: Lungs are clear.  No pleural effusion or pneumothorax. The heart is top-normal in size. Old bilateral rib fracture deformities. IMPRESSION:  Normal chest radiographs. Electronically Signed   By: Lurlean Horns  Rito Ehrlich M.D.   On: 11/16/2018 04:41   Dg Chest 2 View  Result Date: 10/29/2018 CLINICAL DATA:  Short of breath EXAM: CHEST - 2 VIEW COMPARISON:  10/13/2018 FINDINGS: Heart size and vascularity normal. Lungs are clear without infiltrate or effusion. Chronic rib fractures bilaterally. IMPRESSION: No active cardiopulmonary disease. Electronically Signed   By: Marlan Palau M.D.   On: 10/29/2018 17:13   Ct Head Wo Contrast  Result Date: 10/28/2018 CLINICAL DATA:  Fall, vision changes. History of multiple sclerosis. EXAM: CT HEAD WITHOUT CONTRAST TECHNIQUE: Contiguous axial images were obtained from the base of the skull through the vertex without intravenous contrast. COMPARISON:  CT HEAD August 24, 2018 and MRI head August 04, 2017 FINDINGS: BRAIN: No intraparenchymal hemorrhage, mass effect nor midline shift. Mild-to-moderate parenchymal brain volume loss. No hydrocephalus. Confluent supratentorial white matter hypodensities. VASCULAR: Trace calcific atherosclerosis. SKULL/SOFT TISSUES: No skull fracture. Small LEFT parietal scalp hematoma without subcutaneous gas or radiopaque foreign bodies. ORBITS/SINUSES: The included ocular globes and orbital contents are normal.Mild paranasal sinus mucosal thickening. Mastoid air cells are well aerated. OTHER: None. IMPRESSION: 1. No acute intracranial process.  Small LEFT scalp hematoma. 2. Similar white matter changes previously characterized as chronic demyelination. 3. Stable mild-to-moderate parenchymal brain volume loss. Electronically Signed   By: Awilda Metro M.D.   On: 10/28/2018 05:57   Mr Laqueta Jean CW Contrast  Result Date: 11/16/2018 CLINICAL DATA:  Multiple sclerosis, increasing falls. Increasing weakness and difficulty walking. Double vision. EXAM: MRI HEAD WITHOUT AND WITH CONTRAST TECHNIQUE: Multiplanar, multiecho pulse sequences of the brain and surrounding structures were obtained  without and with intravenous contrast. CONTRAST:  Gadavist 7 mL. COMPARISON:  CT head performed 10/28/2018. MR head 08/04/2017. MRI cervical and thoracic spine reported separately. FINDINGS: Brain: No acute infarction, hemorrhage, hydrocephalus, extra-axial collection or mass lesion. Premature for age cerebral and cerebellar atrophy. Extensive T2 and FLAIR hyperintensities throughout the periventricular and subcortical white matter, both focal and confluent, periventricular predominant, greater than 10 black holes, consistent with the diagnosis of chronic multiple sclerosis. No restricted diffusion. No abnormal postcontrast enhancement. No significant progression compared with prior MR from 2018. Vascular: Normal flow voids. Skull and upper cervical spine: Normal marrow signal. MRI cervical spine described separately. Sinuses/Orbits: Negative. Other: LEFT mastoid fluid, likely effusion. IMPRESSION: Extensive chronic multiple sclerosis. No progression of new lesions since 2018. No features of acute plaque activity to suggest ongoing demyelination. Electronically Signed   By: Elsie Stain M.D.   On: 11/16/2018 10:24   Mr Cervical Spine W Wo Contrast  Result Date: 11/16/2018 CLINICAL DATA:  Chronic multiple sclerosis, with increased weakness in both legs and difficulty walking. EXAM: MRI CERVICAL SPINE WITHOUT AND WITH CONTRAST TECHNIQUE: Multiplanar and multiecho pulse sequences of the cervical spine, to include the craniocervical junction and cervicothoracic junction, were obtained without and with intravenous contrast. CONTRAST:  Gadavist 7 mL. COMPARISON:  CT cervical spine 08/24/2018 FINDINGS: Alignment: Physiologic. Vertebrae: No fracture, evidence of discitis, or bone lesion. Cord: The cord is mildly atrophic. There are extensive intramedullary plaques throughout the cervical spine, consistent with spinal multiple sclerosis, extending throughout all segments. These are most prominent at C2, C4, and C6.  There is no abnormal postcontrast enhancement. Posterior Fossa, vertebral arteries, paraspinal tissues: Unremarkable. Disc levels: C2-3: LEFT-sided facet arthropathy, possible LEFT C3 foraminal narrowing. C3-4:  Unremarkable. C4-5:  Shallow protrusion.  No definite impingement. C5-6: Disc space narrowing with central and leftward protrusion and osseous spurring. Borderline LEFT C6 foraminal narrowing. C6-7: Central  and rightward protrusion. Osseous spurring. RIGHT C7 foraminal narrowing. C7-T1: Facet arthropathy. Annular bulge. Trace anterolisthesis. No impingement. IMPRESSION: Findings most consistent with extensive cervical spinal cord chronic multiple sclerosis, although I do not have prior MRI of the cervical spine for correlation. The absence of cord swelling, and lack of postcontrast enhancement, are findings favoring chronicity. No cervical spine fracture or traumatic subluxation. Minor spondylosis does not result in significant spinal stenosis, and there are no features to suggest a compressive myelopathy. Electronically Signed   By: Elsie Stain M.D.   On: 11/16/2018 10:40   Mr Thoracic Spine W Wo Contrast  Result Date: 11/16/2018 CLINICAL DATA:  Chronic multiple sclerosis. Difficulty walking. Query pseudoprogression. EXAM: MRI THORACIC WITHOUT AND WITH CONTRAST TECHNIQUE: Multiplanar and multiecho pulse sequences of the thoracic spine were obtained without and with intravenous contrast. CONTRAST:  Gadavist 7 mL. COMPARISON:  MRI thoracic spine 11/24/2017. FINDINGS: MRI THORACIC SPINE FINDINGS Alignment:  Anatomic Vertebrae: No fracture, evidence of discitis, or bone lesion. Cord: No definite cord lesions. Normal cord volume. No abnormal postcontrast enhancement. Paraspinal and other soft tissues: Unremarkable. Disc levels: No compressive disc protrusion or spinal stenosis. Incidental chronic L1 compression deformity, slight retropulsion of bone, does not affect the conus. IMPRESSION: No evidence for  spinal multiple sclerosis affecting the thoracic spinal cord. No change from prior MR. No evidence for thoracic spondylosis resulting in compressive myelopathy can be identified. Electronically Signed   By: Elsie Stain M.D.   On: 11/16/2018 10:45   Ct Abdomen Pelvis W Contrast  Result Date: 11/01/2018 CLINICAL DATA:  Upper abdominal pain for 2 weeks EXAM: CT ABDOMEN AND PELVIS WITH CONTRAST TECHNIQUE: Multidetector CT imaging of the abdomen and pelvis was performed using the standard protocol following bolus administration of intravenous contrast. CONTRAST:  OMNIPAQUE IOHEXOL 300 MG/ML  SOLN COMPARISON:  10/28/2018, 12/14/2017 CT FINDINGS: Lower chest: Lung bases demonstrate no acute consolidation or effusion. Heart size is normal. Postsurgical changes at the GE junction with evidence of small hiatal hernia. Distal esophageal thickening. Hepatobiliary: No focal liver abnormality is seen. Status post cholecystectomy. No biliary dilatation. Pancreas: Unremarkable. No pancreatic ductal dilatation or surrounding inflammatory changes. Spleen: Normal in size without focal abnormality. Adrenals/Urinary Tract: Adrenal glands are unremarkable. Kidneys are normal, without renal calculi, focal lesion, or hydronephrosis. Bladder is unremarkable. Stomach/Bowel: Stomach is within normal limits. Appendix appears normal. No evidence of bowel wall thickening, distention, or inflammatory changes. Large volume of stool in the colon. Diverticular disease of the sigmoid colon Vascular/Lymphatic: Nonaneurysmal aorta. No significantly enlarged lymph nodes Reproductive: Uterus contains scattered calcifications. Stable 3.5 cm right adnexal cyst. Stable 13 mm left adnexal cyst. Other: Negative for free air or free fluid Musculoskeletal: Chronic superior endplate compression fracture L1. No acute or suspicious abnormality. Old left pubic rami fractures. IMPRESSION: 1. No CT evidence for acute intra-abdominal or pelvic abnormality.  2. Hiatal hernia. Mild distal esophageal thickening which may be secondary to reflux or esophagitis. 3. Stable adnexal cysts 4. Sigmoid colon diverticular disease without acute inflammatory change Electronically Signed   By: Jasmine Pang M.D.   On: 11/01/2018 23:44   Ct Abdomen Pelvis W Contrast  Result Date: 10/28/2018 CLINICAL DATA:  Abdominal pain with nausea and vomiting EXAM: CT ABDOMEN AND PELVIS WITH CONTRAST TECHNIQUE: Multidetector CT imaging of the abdomen and pelvis was performed using the standard protocol following bolus administration of intravenous contrast. CONTRAST:  OMNIPAQUE 300 COMPARISON:  12/14/2017 FINDINGS: Lower chest: Inadvertent scanning of the entire chest was performed.  The lungs are well aerated bilaterally. No focal infiltrate or sizable effusion is seen. No nodules are noted. Thoracic inlet demonstrates tiny hypodensity within the right lobe of the thyroid. No vascular abnormality is seen. Hepatobiliary: Fatty infiltration of the liver is noted. The gallbladder has been surgically removed. Pancreas: Unremarkable. No pancreatic ductal dilatation or surrounding inflammatory changes. Spleen: Normal in size without focal abnormality. Adrenals/Urinary Tract: Adrenal glands are within normal limits. Kidneys demonstrate a normal enhancement pattern bilaterally. No renal calculi or urinary tract obstructive changes are noted. Bladder is well distended. Stomach/Bowel: Mild diverticular change of the colon is noted without evidence of diverticulitis. The appendix is within normal limits. No obstructive or inflammatory changes of the bowel are seen. Changes of prior gastric sleeve surgery are noted. Mild sliding-type hiatal hernia is noted. Vascular/Lymphatic: No significant vascular findings are present. No enlarged abdominal or pelvic lymph nodes. Reproductive: Uterus is within normal limits. Bilateral ovarian cystic change is noted and stable. The largest of these lies on the right  measuring 3.6 cm. Other: No free fluid is noted. Mild fat containing umbilical hernia is again noted and stable. Musculoskeletal: Degenerative changes of lumbar spine are seen. No acute bony abnormality is noted. Chronic compression deformity of L1 is noted. Old pubic rami fractures on the left are noted. IMPRESSION: Chronic changes as described above without acute abnormality. Electronically Signed   By: Alcide Clever M.D.   On: 10/28/2018 06:19   Dg Hip Unilat With Pelvis 2-3 Views Left  Result Date: 11/16/2018 CLINICAL DATA:  Left hip pain. EXAM: DG HIP (WITH OR WITHOUT PELVIS) 2-3V LEFT COMPARISON:  None. FINDINGS: There is no evidence of hip fracture or dislocation. There is no evidence of arthropathy or other focal bone abnormality. IMPRESSION: Negative. Electronically Signed   By: Lupita Raider, M.D.   On: 11/16/2018 10:16    Micro Results    Recent Results (from the past 240 hour(s))  Urine culture     Status: Abnormal (Preliminary result)   Collection Time: 11/16/18  5:37 AM  Result Value Ref Range Status   Specimen Description   Final    URINE, RANDOM Performed at [facility], [address] [place], [place] [address]    Special Requests   Final    NONE Performed at [facility], [address] [place], [place] [address]    Culture >=100,000 COLONIES/mL CITROBACTER FREUNDII (A)  Final   Report Status PENDING  Incomplete       Today   Subjective:   Mary-Jo Butler-Taylor today has no headache,no chest abdominal pain, she is feeling better today,    Objective:   Blood pressure (!) 144/88, pulse 88, temperature 97.8 F (36.6 C), temperature source Oral, resp. rate 18, height  (1.651 m), weight 70.3 kg, last menstrual period 08/13/2014, SpO2 99 %.   Intake/Output Summary (Last 24 hours) at 11/17/2018 1308 Last data filed at 11/17/2018 1010 Gross per 24 hour  Intake 2086.73 ml  Output 1300 ml  Net 786.73 ml    Exam Awake Alert,  Oriented x 3, No new F.N deficits, Normal affect Symmetrical Chest wall movement, Good air movement bilaterally, CTAB RRR,No Gallops,Rubs or new Murmurs, No Parasternal Heave +ve B.Sounds, Abd Soft, No rebound -guarding or rigidity. No Cyanosis, Clubbing or edema, No new Rash or bruise  Data Review   CBC w Diff:  Lab Results  Component Value Date   WBC 6.6 11/17/2018   HGB 11.5 (L)  11/17/2018   HGB 11.4 08/26/2018   HCT 34.7 (L) 11/17/2018   HCT 33.3 (L) 08/26/2018   PLT 389 11/17/2018   PLT 228 08/26/2018   LYMPHOPCT 29 11/16/2018   MONOPCT 10 11/16/2018   EOSPCT 2 11/16/2018   BASOPCT 1 11/16/2018    CMP:  Lab Results  Component Value Date   NA 137 11/17/2018   NA 135 08/26/2018   K 3.9 11/17/2018   CL 99 11/17/2018   CO2 28 11/17/2018   BUN 8 11/17/2018   BUN 20 08/26/2018   CREATININE 0.55 11/17/2018   PROT 5.3 (L) 11/17/2018   PROT 6.4 08/26/2018   ALBUMIN 2.8 (L) 11/17/2018   ALBUMIN 3.8 08/26/2018   BILITOT 1.0 11/17/2018   BILITOT 1.8 (H) 08/26/2018   ALKPHOS 67 11/17/2018   AST 58 (H) 11/17/2018   ALT 24 11/17/2018  .   Total Time in preparing paper work, data evaluation and todays exam - 30 minutes  Huey Bienenstock M.D on 11/17/2018 at 1:08 PM  [facility]   Office  [phone]

## 2018-11-17 NOTE — TOC Initial Note (Signed)
Transition of Care [facility]) - Initial/Assessment Note    Patient Details  Name: Catherine Hubbard MRN: [id number] Date of Birth: 28-Dec-1960  Transition of Care [facility]) CM/SW Contact:    Kermit Balo, RN Phone Number: 11/17/2018, 1:03 PM  Clinical Narrative:                 Currently pt has 24 hour supervision at home.   Expected Discharge Plan: Home/Self Care Barriers to Discharge: Continued Medical Work up   Patient Goals and CMS Choice        Expected Discharge Plan and Services Expected Discharge Plan: Home/Self Care       Living arrangements for the past 2 months: Single Family Home(one level)                          Prior Living Arrangements/Services Living arrangements for the past 2 months: Single Family Home(one level) Lives with:: Significant Other Patient language and need for interpreter reviewed:: Yes(no needs) Do you feel safe going back to the place where you live?: Yes          Current home services: DME(walker) Criminal Activity/Legal Involvement Pertinent to Current Situation/Hospitalization: No - Comment as needed  Activities of Daily Living Home Assistive Devices/Equipment: None ADL Screening (condition at time of admission) Patient's cognitive ability adequate to safely complete daily activities?: Yes Is the patient deaf or have difficulty hearing?: Yes Does the patient have difficulty seeing, even when wearing glasses/contacts?: Yes Does the patient have difficulty concentrating, remembering, or making decisions?: No Patient able to express need for assistance with ADLs?: Yes Does the patient have difficulty dressing or bathing?: Yes Independently performs ADLs?: No Communication: Independent Dressing (OT): Needs assistance Is this a change from baseline?: Change from baseline, expected to last <3days Grooming: Needs assistance Is this a change from baseline?: Change from baseline, expected to last <3 days Feeding:  Independent Bathing: Needs assistance Is this a change from baseline?: Change from baseline, expected to last <3 days Toileting: Needs assistance Is this a change from baseline?: Change from baseline, expected to last <3 days In/Out Bed: Needs assistance Is this a change from baseline?: Change from baseline, expected to last <3 days Walks in Home: Independent Does the patient have difficulty walking or climbing stairs?: Yes Weakness of Legs: Both Weakness of Arms/Hands: None  Permission Sought/Granted                  Emotional Assessment Appearance:: Appears stated age Attitude/Demeanor/Rapport: Engaged Affect (typically observed): Accepting, Pleasant, Appropriate Orientation: : Oriented to Self, Oriented to Place, Oriented to  Time, Oriented to Situation   Psych Involvement: No (comment)  Admission diagnosis:  Lower urinary tract infectious disease [N39.0] Multiple sclerosis exacerbation (HCC) [G35] Patient Active Problem List   Diagnosis Date Noted  . Multiple sclerosis exacerbation (HCC) 11/16/2018  . Exacerbation of multiple sclerosis (HCC) 11/16/2018  . Pneumonia 09/26/2018  . Elevated troponin 09/26/2018  . Acute hypokalemia 09/25/2018  . Arthritis of carpometacarpal joint 03/27/2018  . Nocturnal leg cramps 03/26/2018  . Syncope due to orthostatic hypotension 02/12/2018  . Diarrhea 02/08/2018  . Acute lower UTI 02/08/2018  . Hematoma of scalp 02/08/2018  . Neck pain 01/20/2018  . Foreign body in esophagus   . Esophageal stricture   . Intractable nausea and vomiting 01/31/2017  . Dysphagia   . Severe protein-calorie malnutrition (HCC)   . Nausea & vomiting 01/30/2017  . Hypokalemia 01/30/2017  . Acute  kidney injury (HCC) 01/30/2017  . Cystitis 01/30/2017  . Costochondritis 05/11/2016  . Trochanteric bursitis of both hips 03/28/2016  . Upper back pain 03/28/2016  . Bilateral low back pain with bilateral sciatica 07/13/2015  . Right hip pain 07/13/2015  .  Lumbar radicular pain 03/21/2015  . Other fatigue 01/11/2015  . Dysesthesia 01/11/2015  . Urinary urgency 01/11/2015  . Multiple sclerosis (HCC) 10/06/2014  . Numbness 10/06/2014  . Ataxic gait 10/06/2014  . High risk medication use 10/06/2014  . Gastric bypass status for obesity 10/06/2014  . Cognitive changes 10/06/2014  . Depression with anxiety 10/06/2014  . Restless leg 10/06/2014  . Insomnia 10/06/2014  . Essential hypertension 10/06/2014  . Difficulty hearing 01/13/2014  . Other muscle spasm 01/13/2014   PCP:  [name], PA-C Pharmacy:   [facility] [address], [place] - [address] [place] [address] Phone: [phone] Fax: [phone]     Social Determinants of Health (SDOH) Interventions  Pt drives.  Pt denies issues with taking home meds or obtaining them.  Readmission Risk Interventions No flowsheet data found.

## 2018-11-17 NOTE — Telephone Encounter (Signed)
Pt recently in hospital again. They area advising her to get some repeat labs. Will you see how she is doing. Probably best to do a webex. See how she is doing. Then have her come in for labs they area requesting on follow up.

## 2018-11-17 NOTE — TOC Transition Note (Signed)
Transition of Care [facility]) - CM/SW Discharge Note   Patient Details  Name: Catherine Hubbard MRN: [id number] Date of Birth: 1960/10/20  Transition of Care [facility]) CM/SW Contact:  Kermit Balo, RN Phone Number: 11/17/2018, 3:07 PM   Clinical Narrative:    Pt has transportation home.   Final next level of care: Home w Home Health Services Barriers to Discharge: No Barriers Identified   Patient Goals and CMS Choice   CMS Medicare.gov Compare Post Acute Care list provided to:: Patient Choice offered to / list presented to : Patient  Discharge Placement                       Discharge Plan and Services   Discharge Planning Services: CM Consult Post Acute Care Choice: Home Health              HH Arranged: PT [facility] Agency: [facility]--Cory with [facility] accepted the referral   Social Determinants of Health (SDOH) Interventions     Readmission Risk Interventions No flowsheet data found.

## 2018-11-17 NOTE — Progress Notes (Signed)
AVS reviewed with patient and patient given a copy to take home. All lines removed, patient dressed, belongings packed, and patient taken by nurse via wheelchair to husband's car for discharge.

## 2018-11-18 LAB — URINE CULTURE: Culture: >=100000 — AB

## 2018-11-18 NOTE — Telephone Encounter (Signed)
Left pt a message to call back to schedule webex visit

## 2018-11-26 NOTE — Telephone Encounter (Signed)
Pt called and scheduled Doxy visit for tomorrow at 1pm. She will await text at [phone].

## 2018-11-27 ENCOUNTER — Other Ambulatory Visit: Payer: Self-pay

## 2018-11-27 ENCOUNTER — Encounter: Payer: Self-pay | Admitting: Medical

## 2018-11-27 ENCOUNTER — Other Ambulatory Visit: Payer: Federal, State, Local not specified - PPO

## 2018-11-27 ENCOUNTER — Ambulatory Visit (INDEPENDENT_AMBULATORY_CARE_PROVIDER_SITE_OTHER): Payer: Federal, State, Local not specified - PPO | Admitting: Medical

## 2018-11-27 ENCOUNTER — Telehealth: Payer: Self-pay | Admitting: Medical

## 2018-11-27 DIAGNOSIS — R300 Dysuria: Secondary | ICD-10-CM | POA: Diagnosis not present

## 2018-11-27 DIAGNOSIS — R600 Localized edema: Secondary | ICD-10-CM

## 2018-11-27 DIAGNOSIS — K858 Other acute pancreatitis without necrosis or infection: Secondary | ICD-10-CM

## 2018-11-27 DIAGNOSIS — G35 Multiple sclerosis: Secondary | ICD-10-CM

## 2018-11-27 DIAGNOSIS — I10 Essential (primary) hypertension: Secondary | ICD-10-CM

## 2018-11-27 NOTE — Progress Notes (Signed)
   Subjective:    Patient ID: Catherine Hubbard, female    DOB: [birth year], 58 y.o.   MRN: [id number]  HPI  Virtual Visit via Video Note  I connected with Catherine Hubbard on 11/27/18 at  1:00 PM EDT by a video enabled telemedicine application and verified that I am speaking with the correct person using two identifiers.   I discussed the limitations of evaluation and management by telemedicine and the availability of in person appointments. The patient expressed understanding and agreed to proceed.   History of Present Illness:   Pt had recent hospitalization and was discharged.   Pt had MS exacerbation and uti by culture. Pt was admitted on 11/16/2018 and discharged on 11/17/2018.  Pt was given rocephin antibiotic and fosamycin during hospitalization. Pt states just recently she started to have little pain again on urination as well as some odor on urination. I saw pt urine culture result post hospitalization. Staff tried to call her but never got to speak with her.   She states her energy is back to normal. She seems to have had possible uti as cause of weakness. Possible ms excacerbation.  Pt also had former hospitalization had pancreatitis in march. She admitted some alcohol use prior to pancreatitis. Pt told to follow up with her GI. She had Ct of abdomen which was negative. Looks like MRI mrcop done without contrast. But I don't see that report in epic. Pt see [facility] on [address]. [phone]. Or [phone].  Pt has hx of pedal edema. Pt states not swollen. She is only using hctz very rarely.   No vitals taken today.      Observations/Objective: No acute distress. Pt has no pedal edema on inspection by video.  Assessment and Plan:   Follow Up Instructions:    I discussed the assessment and treatment plan with the patient. The patient was provided an opportunity to ask questions and all were answered. The patient agreed with the plan and  demonstrated an understanding of the instructions.   The patient was advised to call back or seek an in-person evaluation if the symptoms worsen or if the condition fails to improve as anticipated.  I provided 40 minutes of non-face-to-face time during this encounter.  50% of time spent counseling patient on her various diagnoses.  Explained plan going forward for each condition.   [name], PA-C   Review of Systems  Constitutional: Negative for chills, fatigue and fever.  HENT: Negative for congestion, dental problem and ear pain.   Respiratory: Negative for cough, chest tightness, shortness of breath and wheezing.   Cardiovascular: Negative for chest pain and palpitations.  Gastrointestinal: Negative for abdominal pain.  Genitourinary: Positive for dysuria. Negative for decreased urine volume, difficulty urinating, frequency, hematuria, urgency, vaginal discharge and vaginal pain.       Odor to urine past 2 days.  Musculoskeletal: Negative for back pain.  Skin: Negative for rash.  Neurological: Negative for dizziness, facial asymmetry, speech difficulty and weakness.  Hematological: Negative for adenopathy. Does not bruise/bleed easily.  Psychiatric/Behavioral: Negative for agitation, confusion and dysphoric mood.       Objective:   Physical Exam See objective section.        Assessment & Plan:

## 2018-11-27 NOTE — Patient Instructions (Addendum)
You appear to have gotten over your pancreatitis completely.  On review I did not see any abnormal findings on CT.  However was unable to find the MRI report.  We will go ahead and refer you to your gastroenterologist for follow-up regarding the pancreatitis.  Reminder not to drink any alcohol as that is often common cause of pancreatitis.  By your report it does appear that your are having probable recurrent UTI since dysuria and urine odor reported.  You give a urine sample today in the office and will do a culture.  After culture dropped off can start Cipro antibiotic.  Rx advisement given.  Your MS appears stable presently.  Some of your previously reported symptoms might have been from the UTI and not necessarily from MS.  Pedal edema is well controlled recently.  Continue current medications and will monitor only to use diuretic very sparingly on occasion for 1 day at most.  We will get labs recommended on hospital discharge.  This would be a CBC, CMP, amylase and lipase.  Follow-up date to be determined after lab review.

## 2018-11-27 NOTE — Telephone Encounter (Signed)
Copied from CRM [id number]. Topic: Quick Communication - See Telephone Encounter >> Nov 27, 2018  4:43 PM Angela Nevin wrote: CRM for notification. See Telephone encounter for: 11/27/18.  Patient calling to check on status of antibiotic that she states was supposed to be sent into pharmacy after visit today. Attempted to contact office, office closed. Patient is requesting a call back as soon as possible.

## 2018-11-27 NOTE — Addendum Note (Signed)
Addended by: Mervin Kung A on: 11/27/2018 03:17 PM   Modules accepted: Orders

## 2018-11-28 LAB — LIPASE: Lipase: 45 U/L (ref 7–60)

## 2018-11-28 LAB — CBC WITH DIFFERENTIAL/PLATELET
Absolute Monocytes: 730 cells/uL (ref 200–950)
Basophils Absolute: 78 cells/uL (ref 0–200)
Basophils Relative: 1.9 %
Eosinophils Absolute: 78 cells/uL (ref 15–500)
Eosinophils Relative: 1.9 %
HCT: 35.3 % (ref 35.0–45.0)
Hemoglobin: 12.1 g/dL (ref 11.7–15.5)
Lymphs Abs: 1238 cells/uL (ref 850–3900)
MCH: 32.6 pg (ref 27.0–33.0)
MCHC: 34.3 g/dL (ref 32.0–36.0)
MCV: 95.1 fL (ref 80.0–100.0)
MPV: 9.9 fL (ref 7.5–12.5)
Monocytes Relative: 17.8 %
Neutro Abs: 1976 cells/uL (ref 1500–7800)
Neutrophils Relative %: 48.2 %
Platelets: 320 10*3/uL (ref 140–400)
RBC: 3.71 10*6/uL — ABNORMAL LOW (ref 3.80–5.10)
RDW: 13.3 % (ref 11.0–15.0)
Total Lymphocyte: 30.2 %
WBC: 4.1 10*3/uL (ref 3.8–10.8)

## 2018-11-28 LAB — COMPREHENSIVE METABOLIC PANEL
AG Ratio: 1.6 (calc) (ref 1.0–2.5)
ALT: 34 U/L — ABNORMAL HIGH (ref 6–29)
AST: 55 U/L — ABNORMAL HIGH (ref 10–35)
Albumin: 3.8 g/dL (ref 3.6–5.1)
Alkaline phosphatase (APISO): 76 U/L (ref 37–153)
BUN: 15 mg/dL (ref 7–25)
CO2: 31 mmol/L (ref 20–32)
Calcium: 8.9 mg/dL (ref 8.6–10.4)
Chloride: 100 mmol/L (ref 98–110)
Creat: 0.62 mg/dL (ref 0.50–1.05)
Globulin: 2.4 g/dL (calc) (ref 1.9–3.7)
Glucose, Bld: 76 mg/dL (ref 65–99)
Potassium: 5.1 mmol/L (ref 3.5–5.3)
Sodium: 140 mmol/L (ref 135–146)
Total Bilirubin: 0.7 mg/dL (ref 0.2–1.2)
Total Protein: 6.2 g/dL (ref 6.1–8.1)

## 2018-11-28 LAB — URINE CULTURE
MICRO NUMBER:: 385990
SPECIMEN QUALITY:: ADEQUATE

## 2018-11-28 LAB — AMYLASE: Amylase: 40 U/L (ref 21–101)

## 2018-11-30 ENCOUNTER — Emergency Department (HOSPITAL_BASED_OUTPATIENT_CLINIC_OR_DEPARTMENT_OTHER): Payer: Federal, State, Local not specified - PPO

## 2018-11-30 ENCOUNTER — Encounter (HOSPITAL_BASED_OUTPATIENT_CLINIC_OR_DEPARTMENT_OTHER): Payer: Self-pay | Admitting: Emergency Medicine

## 2018-11-30 ENCOUNTER — Emergency Department (HOSPITAL_BASED_OUTPATIENT_CLINIC_OR_DEPARTMENT_OTHER)
Admission: EM | Admit: 2018-11-30 | Discharge: 2018-11-30 | Disposition: A | Discharge status: Home / Self Care | Payer: Federal, State, Local not specified - PPO | Attending: Emergency Medicine | Admitting: Emergency Medicine

## 2018-11-30 ENCOUNTER — Other Ambulatory Visit: Payer: Self-pay

## 2018-11-30 DIAGNOSIS — R0789 Other chest pain: Secondary | ICD-10-CM | POA: Diagnosis not present

## 2018-11-30 DIAGNOSIS — S299XXA Unspecified injury of thorax, initial encounter: Secondary | ICD-10-CM | POA: Diagnosis present

## 2018-11-30 DIAGNOSIS — G35 Multiple sclerosis: Secondary | ICD-10-CM | POA: Insufficient documentation

## 2018-11-30 DIAGNOSIS — R2243 Localized swelling, mass and lump, lower limb, bilateral: Secondary | ICD-10-CM | POA: Insufficient documentation

## 2018-11-30 DIAGNOSIS — R296 Repeated falls: Secondary | ICD-10-CM | POA: Diagnosis not present

## 2018-11-30 DIAGNOSIS — Y999 Unspecified external cause status: Secondary | ICD-10-CM | POA: Diagnosis not present

## 2018-11-30 DIAGNOSIS — S5012XA Contusion of left forearm, initial encounter: Secondary | ICD-10-CM | POA: Diagnosis not present

## 2018-11-30 DIAGNOSIS — S2221XA Fracture of manubrium, initial encounter for closed fracture: Secondary | ICD-10-CM

## 2018-11-30 DIAGNOSIS — Y939 Activity, unspecified: Secondary | ICD-10-CM | POA: Diagnosis not present

## 2018-11-30 DIAGNOSIS — S40021A Contusion of right upper arm, initial encounter: Secondary | ICD-10-CM | POA: Diagnosis not present

## 2018-11-30 DIAGNOSIS — R8290 Unspecified abnormal findings in urine: Secondary | ICD-10-CM | POA: Diagnosis not present

## 2018-11-30 DIAGNOSIS — S199XXA Unspecified injury of neck, initial encounter: Secondary | ICD-10-CM | POA: Diagnosis not present

## 2018-11-30 DIAGNOSIS — M25511 Pain in right shoulder: Secondary | ICD-10-CM | POA: Insufficient documentation

## 2018-11-30 DIAGNOSIS — S40011A Contusion of right shoulder, initial encounter: Secondary | ICD-10-CM | POA: Diagnosis not present

## 2018-11-30 DIAGNOSIS — J45909 Unspecified asthma, uncomplicated: Secondary | ICD-10-CM | POA: Diagnosis not present

## 2018-11-30 DIAGNOSIS — I10 Essential (primary) hypertension: Secondary | ICD-10-CM | POA: Diagnosis not present

## 2018-11-30 DIAGNOSIS — S0003XA Contusion of scalp, initial encounter: Secondary | ICD-10-CM | POA: Diagnosis not present

## 2018-11-30 DIAGNOSIS — R82998 Other abnormal findings in urine: Secondary | ICD-10-CM | POA: Diagnosis not present

## 2018-11-30 DIAGNOSIS — R1013 Epigastric pain: Secondary | ICD-10-CM | POA: Insufficient documentation

## 2018-11-30 DIAGNOSIS — M542 Cervicalgia: Secondary | ICD-10-CM | POA: Diagnosis not present

## 2018-11-30 DIAGNOSIS — S40022A Contusion of left upper arm, initial encounter: Secondary | ICD-10-CM | POA: Insufficient documentation

## 2018-11-30 DIAGNOSIS — R748 Abnormal levels of other serum enzymes: Secondary | ICD-10-CM | POA: Insufficient documentation

## 2018-11-30 DIAGNOSIS — W19XXXA Unspecified fall, initial encounter: Secondary | ICD-10-CM | POA: Insufficient documentation

## 2018-11-30 DIAGNOSIS — Z7722 Contact with and (suspected) exposure to environmental tobacco smoke (acute) (chronic): Secondary | ICD-10-CM | POA: Insufficient documentation

## 2018-11-30 DIAGNOSIS — Z79899 Other long term (current) drug therapy: Secondary | ICD-10-CM | POA: Diagnosis not present

## 2018-11-30 DIAGNOSIS — Y929 Unspecified place or not applicable: Secondary | ICD-10-CM | POA: Insufficient documentation

## 2018-11-30 DIAGNOSIS — S0990XA Unspecified injury of head, initial encounter: Secondary | ICD-10-CM | POA: Diagnosis not present

## 2018-11-30 DIAGNOSIS — S20219A Contusion of unspecified front wall of thorax, initial encounter: Secondary | ICD-10-CM | POA: Diagnosis not present

## 2018-11-30 LAB — COMPREHENSIVE METABOLIC PANEL
ALT: 32 U/L (ref 0–44)
AST: 49 U/L — ABNORMAL HIGH (ref 15–41)
Albumin: 3.9 g/dL (ref 3.5–5.0)
Alkaline Phosphatase: 83 U/L (ref 38–126)
Anion gap: 7 (ref 5–15)
BUN: 22 mg/dL — ABNORMAL HIGH (ref 6–20)
CO2: 30 mmol/L (ref 22–32)
Calcium: 8.8 mg/dL — ABNORMAL LOW (ref 8.9–10.3)
Chloride: 101 mmol/L (ref 98–111)
Creatinine, Ser: 0.48 mg/dL (ref 0.44–1.00)
GFR calc Af Amer: >60 mL/min (ref >60)
GFR calc non Af Amer: >60 mL/min (ref >60)
Glucose, Bld: 76 mg/dL (ref 70–99)
Potassium: 3.6 mmol/L (ref 3.5–5.1)
Sodium: 138 mmol/L (ref 135–145)
Total Bilirubin: 0.6 mg/dL (ref 0.3–1.2)
Total Protein: 7.1 g/dL (ref 6.5–8.1)

## 2018-11-30 LAB — CBC WITH DIFFERENTIAL/PLATELET
Abs Immature Granulocytes: 0.01 10*3/uL (ref 0.00–0.07)
Basophils Absolute: 0.1 10*3/uL (ref 0.0–0.1)
Basophils Relative: 1 %
Eosinophils Absolute: 0.1 10*3/uL (ref 0.0–0.5)
Eosinophils Relative: 2 %
HCT: 36 % (ref 36.0–46.0)
Hemoglobin: 11.9 g/dL — ABNORMAL LOW (ref 12.0–15.0)
Immature Granulocytes: 0 %
Lymphocytes Relative: 25 %
Lymphs Abs: 1.4 10*3/uL (ref 0.7–4.0)
MCH: 32.7 pg (ref 26.0–34.0)
MCHC: 33.1 g/dL (ref 30.0–36.0)
MCV: 98.9 fL (ref 80.0–100.0)
Monocytes Absolute: 0.7 10*3/uL (ref 0.1–1.0)
Monocytes Relative: 13 %
Neutro Abs: 3.2 10*3/uL (ref 1.7–7.7)
Neutrophils Relative %: 59 %
Platelets: 289 10*3/uL (ref 150–400)
RBC: 3.64 MIL/uL — ABNORMAL LOW (ref 3.87–5.11)
RDW: 13.7 % (ref 11.5–15.5)
WBC: 5.5 10*3/uL (ref 4.0–10.5)
nRBC: 0 % (ref 0.0–0.2)

## 2018-11-30 LAB — URINALYSIS, ROUTINE W REFLEX MICROSCOPIC
Bilirubin Urine: NEGATIVE
Glucose, UA: NEGATIVE mg/dL
Hgb urine dipstick: NEGATIVE
Ketones, ur: NEGATIVE mg/dL
Leukocytes,Ua: NEGATIVE
Nitrite: NEGATIVE
Protein, ur: NEGATIVE mg/dL
Specific Gravity, Urine: 1.01 (ref 1.005–1.030)
pH: 6.5 (ref 5.0–8.0)

## 2018-11-30 LAB — TROPONIN I: Troponin I: <0.03 ng/mL (ref <0.03)

## 2018-11-30 LAB — LIPASE, BLOOD: Lipase: 75 U/L — ABNORMAL HIGH (ref 11–51)

## 2018-11-30 MED ORDER — TRAMADOL HCL 50 MG PO TABS: 50.0000 mg | ORAL_TABLET | Freq: Four times a day (QID) | ORAL | 0 refills | Status: DC | PRN

## 2018-11-30 NOTE — Discharge Instructions (Signed)
Evaluated today for fall.  You do have a fracture in your sternum.  We have given you an incentive spirometer.  Please use this 3-4 times a day.  I have also written you prescription for tramadol for pain.  Please only take as needed.  Your urinalysis did not show evidence of infection.  Your lab work was at your baseline.  If you do have additional swelling in your lower extremities I would suggest you take 1 dose of Lasix.  Follow-up with your PCP for reevaluation in 3 days.  Return to the ED for any new or worsening symptoms.

## 2018-11-30 NOTE — ED Notes (Signed)
Asked Pt. For urine sample again and she again said she still can not go at this time.  Pt. Did try earlier and has BSC in room with call bell on bedside to call for help.

## 2018-11-30 NOTE — ED Provider Notes (Signed)
[facility] EMERGENCY DEPARTMENT Provider Note   CSN: [id number] Arrival date & time: 11/30/18  7846  History   Chief Complaint Chief Complaint  Patient presents with   Chest Pain   Headache   Leg Swelling    HPI Catherine Hubbard is a 58 y.o. female with past medical history significant for MS, hypertension, elevated troponin, orthostatic hypotension, urinary incontinence, gastric bypass who presents for evaluation of body aches and pains.  Patient states she has had multiple falls at home secondary to weakness from her MS.  States she fell approximately 1 week ago and hit the back of her head as well as left side of her chest.  Patient denies LOC, preceding CP, Sob or dizziness.  She denies anticoagulation.  Patient admits to left-sided chest pain, head pain which she rates a 6/10.  Pain does not radiate.  Chest pain is nonexertional, no associated lightheadedness, dizziness, nausea, radiation to arms.  States pain is been present x1 week.  Pain is worse with movement such as sitting up from bed or twisting at the waist.  Patient states she also has right shoulder pain after the fall.  He denies decreased range of motion or numbness or tingling in her extremities.  She has taken Tylenol for her pain, however she did not take ibuprofen secondary to her gastric bypass surgery.  Patient states she has had some mild epigastric abdominal tenderness, however states that she was recently discharged from the hospital for acute pancreatitis.  Patient states she is a frequent beer drinker.  States that she drinks 2-3 beers every night.  Last drink yesterday evening.  Patient said pain is also been present x1 week.  Describes as feeling as "the skin on my head is sore."  Denies sudden onset thunderclap headache, slurred speech, unilateral weakness.  She states she is also had bilateral lower extremity swelling.  Patient states this is at her baseline is not worse than normal.  Denies  erythema, warmth, tenderness, rashes or lesions. No pain to posterior calves. States she has also had malodorous urine. Admits to hx of frequent UTI 2/2 incontinence. Denies fever, chills, nausea, vomiting, slurred speech, facial asymmetry, unilateral weakness, cough, shortness of breath lower abdominal pain, diarrhea, dysuria, constipation, pelvic pain, vaginal discharge, rashes, lesions, midline back or midline cervical neck pain.  Husband states that patient was supposed to be using a walker for ambulation, however does not use this causing her frequent falls.   She states she did see her PCP 3 days ago for the above symptoms.  Per records review patient's labs at baseline, lipase improving from hospitalization.  Leg swelling at patient's baseline.  He did write her a prescription for Lasix for her to take for any exacerbations of her lower extremity edema.  Patient states she has not taken this.  Her urinalysis and culture from 3 days ago was negative for UTI at the time.  History provided by patient and her husband.  No interpreter was used.     HPI  Past Medical History:  Diagnosis Date   Asthma    Esophagitis    GERD (gastroesophageal reflux disease)    Headache    Hearing loss    Hypertension    Multiple sclerosis (HCC)    Vision abnormalities     Patient Active Problem List   Diagnosis Date Noted   Multiple sclerosis exacerbation (HCC) 11/16/2018   Exacerbation of multiple sclerosis (HCC) 11/16/2018   Pneumonia 09/26/2018   Elevated troponin  09/26/2018   Acute hypokalemia 09/25/2018   Arthritis of carpometacarpal joint 03/27/2018   Nocturnal leg cramps 03/26/2018   Syncope due to orthostatic hypotension 02/12/2018   Diarrhea 02/08/2018   Acute lower UTI 02/08/2018   Hematoma of scalp 02/08/2018   Neck pain 01/20/2018   Foreign body in esophagus    Esophageal stricture    Intractable nausea and vomiting 01/31/2017   Dysphagia    Severe  protein-calorie malnutrition (HCC)    Nausea & vomiting 01/30/2017   Hypokalemia 01/30/2017   Acute kidney injury (HCC) 01/30/2017   Cystitis 01/30/2017   Costochondritis 05/11/2016   Trochanteric bursitis of both hips 03/28/2016   Upper back pain 03/28/2016   Bilateral low back pain with bilateral sciatica 07/13/2015   Right hip pain 07/13/2015   Lumbar radicular pain 03/21/2015   Other fatigue 01/11/2015   Dysesthesia 01/11/2015   Urinary urgency 01/11/2015   Multiple sclerosis (HCC) 10/06/2014   Numbness 10/06/2014   Ataxic gait 10/06/2014   High risk medication use 10/06/2014   Gastric bypass status for obesity 10/06/2014   Cognitive changes 10/06/2014   Depression with anxiety 10/06/2014   Restless leg 10/06/2014   Insomnia 10/06/2014   Essential hypertension 10/06/2014   Difficulty hearing 01/13/2014   Other muscle spasm 01/13/2014    Past Surgical History:  Procedure Laterality Date   ANKLE FRACTURE SURGERY Bilateral    CARPAL TUNNEL RELEASE Bilateral    COLONOSCOPY  11/2016   multiple   ESOPHAGOGASTRODUODENOSCOPY  11/2016   ESOPHAGOGASTRODUODENOSCOPY N/A 02/01/2017   Procedure: ESOPHAGOGASTRODUODENOSCOPY (EGD);  Surgeon: Iva Boop, MD;  Location: [facility] ENDOSCOPY;  Service: Endoscopy;  Laterality: N/A;   ESOPHAGOGASTRODUODENOSCOPY (EGD) WITH PROPOFOL N/A 02/04/2017   Procedure: ESOPHAGOGASTRODUODENOSCOPY (EGD) WITH PROPOFOL;  Surgeon: Meryl Dare, MD;  Location: [facility] ENDOSCOPY;  Service: Endoscopy;  Laterality: N/A;   LAPAROSCOPIC GASTRIC SLEEVE RESECTION  2011   ULNAR NERVE REPAIR Bilateral      OB History   No obstetric history on file.      Home Medications    Prior to Admission medications   Medication Sig Start Date End Date Taking? Authorizing Provider  albuterol (PROVENTIL) (2.5 MG/3ML) 0.083% nebulizer solution Take 3 mLs (2.5 mg total) by nebulization every 6 (six) hours as needed for wheezing or shortness of  breath. 10/31/18   Saguier, Ramon Dredge, PA-C  baclofen (LIORESAL) 10 MG tablet Take 1 tablet (10 mg total) by mouth 3 (three) times daily. Patient taking differently: Take 10 mg by mouth at bedtime.  08/26/18   Sater, Pearletha Furl, MD  DULoxetine (CYMBALTA) 60 MG capsule Take 1 capsule (60 mg total) by mouth daily. 08/26/18   Sater, Pearletha Furl, MD  hydrochlorothiazide (HYDRODIURIL) 25 MG tablet 1 tab po daily as needed severe pedal edema. Patient not taking: Reported on 11/16/2018 10/31/18   Saguier, Ramon Dredge, PA-C  losartan (COZAAR) 25 MG tablet Take 1 tablet (25 mg total) by mouth daily. 10/31/18   Saguier, Ramon Dredge, PA-C  modafinil (PROVIGIL) 200 MG tablet TAKE 1 TABLET BY MOUTH EVERY DAY Patient taking differently: Take 200 mg by mouth daily as needed (Sleepyness).  11/03/18   Levert Feinstein, MD  Multiple Vitamin (MULTIVITAMIN WITH MINERALS) TABS tablet Take 1 tablet by mouth daily.    [provider]  ocrelizumab 600 mg in sodium chloride 0.9 % 500 mL Inject 600 mg into the vein every 6 (six) months. Last infusion May 2019 - due in December 2019    [provider]  olmesartan [facility]) 20  MG tablet Take 1 tablet (20 mg total) by mouth daily. Patient not taking: Reported on 11/16/2018 11/06/18   Saguier, Ramon Dredge, PA-C  omeprazole (PRILOSEC) 20 MG capsule Take 2 capsules (40 mg total) by mouth 2 (two) times daily. 11/13/18   Saguier, Ramon Dredge, PA-C  ondansetron (ZOFRAN ODT) 8 MG disintegrating tablet Take 1 tablet (8 mg total) by mouth every 8 (eight) hours as needed for nausea or vomiting. 11/11/18   Saguier, Ramon Dredge, PA-C  oxybutynin (DITROPAN) 5 MG tablet Take 1 tablet (5 mg total) by mouth 2 (two) times daily. Patient not taking: Reported on 11/16/2018 08/26/18   Sater, Pearletha Furl, MD  potassium chloride SA (K-DUR,KLOR-CON) 20 MEQ tablet Take 1 tablet (20 mEq total) by mouth 2 (two) times daily. Patient not taking: Reported on 11/16/2018 10/28/18   Palumbo, April, MD  sucralfate (CARAFATE) 1 GM/10ML suspension  Take 10 mLs (1 g total) by mouth 4 (four) times daily -  with meals and at bedtime. Patient not taking: Reported on 11/16/2018 10/28/18   Palumbo, April, MD  traMADol (ULTRAM) 50 MG tablet Take 1 tablet (50 mg total) by mouth every 6 (six) hours as needed. 11/30/18   Cherilyn Sautter A, PA-C    Family History Family History  Problem Relation Age of Onset   Cancer Mother    Stroke Mother    Cancer Father     Social History Social History   Tobacco Use   Smoking status: Never Smoker   Smokeless tobacco: Never Used   Tobacco comment: second hand smoker when young. Heavy exposure.  Substance Use Topics   Alcohol use: Not Currently    Alcohol/week: 0.0 standard drinks    Comment: Sts. she drinks beer, liquor daily, varying amts/fim   Drug use: No     Allergies   Ibuprofen and Nsaids   Review of Systems Review of Systems  Constitutional: Negative.   HENT: Negative.   Respiratory: Negative.   Cardiovascular: Positive for chest pain and leg swelling. Negative for palpitations.  Gastrointestinal: Positive for abdominal pain. Negative for abdominal distention, anal bleeding, blood in stool, constipation, diarrhea, nausea, rectal pain and vomiting.  Genitourinary: Negative for decreased urine volume, difficulty urinating, dysuria, flank pain, frequency, genital sores, hematuria, pelvic pain, urgency, vaginal bleeding, vaginal discharge and vaginal pain.       Malodorous urine  Musculoskeletal: Positive for gait problem. Negative for arthralgias, neck pain and neck stiffness.  Skin: Positive for wound.  Neurological: Positive for headaches. Negative for dizziness, tremors, seizures, syncope, facial asymmetry, speech difficulty, weakness, light-headedness and numbness.  All other systems reviewed and are negative.   Physical Exam Updated Vital Signs BP 134/76    Pulse 72    Temp 98 F (36.7 C) (Oral)    Resp (!) 22    Ht 5\' 5"  (1.651 m)    Wt 70.3 kg    LMP 08/13/2014  Comment: very few periods   SpO2 99%    BMI 25.79 kg/m   Physical Exam Vitals signs and nursing note reviewed. Exam conducted with a chaperone present.  Constitutional:      General: She is not in acute distress.    Appearance: She is well-developed. She is not toxic-appearing or diaphoretic.     Comments: Chronically ill-appearing.  HENT:     Head: Normocephalic. Contusion present. No raccoon eyes, Battle's sign, abrasion, masses, right periorbital erythema, left periorbital erythema or laceration.      Comments: Ecchymosis to left parietal region of head.  Mildly  tender to palpation.  No step-offs.  No raccoon eyes, battle signs.  No lacerations.  Mild jaw occlusion without tenderness, trismus or swelling.  No facial bone tenderness or crepitus.    Ears:     Comments: No hemotympanum.    Nose: Nose normal.     Comments: No sinus tenderness.    Mouth/Throat:     Comments: Mucous membranes mildly dry.  Uvula midline without deviation.  Lips pink. Eyes:     Pupils: Pupils are equal, round, and reactive to light.     Comments: PERRLA.  Extraocular movements intact.  No nystagmus.  Conjunctival without pallor.  Neck:     Musculoskeletal: Full passive range of motion without pain and normal range of motion.     Comments: Mild paraspinal muscle tenderness bilaterally.  No midline cervical neck tenderness.  No stiffness or rigidity.  Phonation normal.  No meningismus. Cardiovascular:     Rate and Rhythm: Normal rate.     Pulses: Normal pulses.     Heart sounds: Normal heart sounds.     Comments: No murmurs, rubs or gallops. Pulmonary:     Effort: No respiratory distress.     Comments: Clear to auscultation bilaterally that wheeze, rhonchi or rales.  No accessory muscle usage.  Speaks in full sentences without difficulty. Chest:     Chest wall: Tenderness present. No mass, deformity, swelling, crepitus or edema. There is no dullness to percussion.     Breasts: Breasts are symmetrical.         Right: Normal. No swelling, bleeding, inverted nipple, mass, nipple discharge, skin change or tenderness.        Left: Tenderness present. No swelling, bleeding, inverted nipple, mass or nipple discharge.       Comments: Chest wall diffusely tender to palpation.  She has ecchymosis to left upper chest into left breast. Abdominal:     General: Bowel sounds are normal. There is no distension.     Palpations: Abdomen is soft. There is no shifting dullness, fluid wave, hepatomegaly or splenomegaly.     Tenderness: There is abdominal tenderness in the epigastric area. There is no right CVA tenderness, left CVA tenderness, guarding or rebound. Negative signs include Murphy's sign and Rovsing's sign.     Hernia: No hernia is present.       Comments: Soft without rebound or guarding.  She has mild tenderness to the epigastric region.  Normoactive bowel sounds.  Negative CVA tenderness.  Negative Murphy sign.  She has no abdominal wall ecchymosis, erythema, rashes or lesions.  Musculoskeletal: Normal range of motion.     Right shoulder: She exhibits tenderness. She exhibits normal range of motion, no swelling, no effusion, no crepitus, no deformity, no laceration, no pain, no spasm, normal pulse and normal strength.     Left shoulder: Normal.     Right elbow: Normal.    Left elbow: Normal.     Right wrist: Normal.     Left wrist: Normal.     Right hip: Normal.     Left hip: Normal.     Right knee: Normal.     Left knee: Normal.     Right ankle: Normal.     Left ankle: Normal.     Thoracic back: Normal.     Lumbar back: Normal.     Right upper arm: Normal.     Left upper arm: Normal.     Right forearm: Normal.     Left forearm: Normal.  Right upper leg: Normal.     Left upper leg: Normal.     Right lower leg: Normal.     Left lower leg: Normal.     Comments: Range of motion all 4 extremities without difficulty.  She does have tenderness to left anterior aspect of right shoulder.   Hawkins, empty can.  She does have bruising to forearm bilateral upper extremities.  She has no bony tenderness.  No lacerations to suture.  2+ radial as well as DP, PT pulses bilaterally.  No evidence of obvious deformities. See pictures in note.  No tenderness to bilateral hips.  Pelvis stable.  No shortening or rotation of extremities. No midline C/T/L tenderness palpation.  No paraspinal muscle tenderness to thoracic or lumbar spine.  Full range of motion spine without difficulty.  She does have difficulty sitting up in bed on her own secondary to her MS.  Husband states is at her baseline.  Feet:     Comments: Bilateral 2+ pitting edema to shins.  No evidence of skin breakdown, ulcers, erythema or warmth.  2+ DP, PT pulses bilaterally.  Full range of motion without difficulty. Skin:    General: Skin is warm and dry.     Findings: Abrasion, bruising, ecchymosis and signs of injury present. No abscess, acne, erythema, laceration, lesion, petechiae or rash.     Comments: Skin capillary refill.  She does have bruising as well as ecchymosis to multiple areas.  See picture in chart.  No vesicular, maculopapular, petechial or purpuric rash.  Neurological:     Mental Status: She is alert.     Comments: Mental Status:  Alert, oriented, thought content appropriate. Speech fluent without evidence of aphasia. Able to follow 2 step commands without difficulty.  Cranial Nerves:  II:  Peripheral visual fields grossly normal, pupils equal, round, reactive to light III,IV, VI: ptosis not present, extra-ocular motions intact bilaterally  V,VII: smile symmetric, facial light touch sensation equal VIII: hearing grossly normal bilaterally  IX,X: midline uvula rise  XI: bilateral shoulder shrug equal and strong XII: midline tongue extension  Motor:  5/5 in upper and lower extremities bilaterally including strong and equal grip strength and dorsiflexion/plantar flexion Sensory: Pinprick and light touch normal in  all extremities.  Deep Tendon Reflexes: 2+ and symmetric  Cerebellar: normal finger-to-nose with bilateral upper extremities Gait: normal gait and balance CV: distal pulses palpable throughout             ED Treatments / Results  Labs (all labs ordered are listed, but only abnormal results are displayed) Labs Reviewed  CBC WITH DIFFERENTIAL/PLATELET - Abnormal; Notable for the following components:      Result Value   RBC 3.64 (*)    Hemoglobin 11.9 (*)    All other components within normal limits  COMPREHENSIVE METABOLIC PANEL - Abnormal; Notable for the following components:   BUN 22 (*)    Calcium 8.8 (*)    AST 49 (*)    All other components within normal limits  LIPASE, BLOOD - Abnormal; Notable for the following components:   Lipase 75 (*)    All other components within normal limits  URINE CULTURE  URINALYSIS, ROUTINE W REFLEX MICROSCOPIC  TROPONIN I    EKG None  Radiology Dg Ribs Unilateral W/chest Left  Result Date: 11/30/2018 CLINICAL DATA:  Anterior superior right chest pain after falling 1 week ago. Left chest bruising. EXAM: LEFT RIBS AND CHEST - 3+ VIEW COMPARISON:  Chest radiographs 11/16/2018. FINDINGS: The  heart size and mediastinal contours are stable. There is aortic and brachiocephalic tortuosity. There are old rib fractures bilaterally. No evidence of acute left-sided rib fracture, pleural effusion or pneumothorax. There are postsurgical changes at the GE junction. IMPRESSION: No evidence of acute left-sided rib fracture, pleural effusion or pneumothorax. Old rib fractures are present bilaterally. Electronically Signed   By: Carey Bullocks M.D.   On: 11/30/2018 12:42   Dg Shoulder Right  Result Date: 11/30/2018 CLINICAL DATA:  Palpable knot with ongoing pain at the right shoulder. Patient fell 1 week ago. Bruising. EXAM: RIGHT SHOULDER - 2+ VIEW COMPARISON:  Right humerus radiographs 12/14/2015. FINDINGS: The bones are demineralized. No evidence of  acute fracture or dislocation. The subacromial space appears preserved. There are mild acromioclavicular degenerative changes. Old rib fractures are present on the right. No focal soft tissue swelling identified. IMPRESSION: No evidence of acute fracture or dislocation. Electronically Signed   By: Carey Bullocks M.D.   On: 11/30/2018 12:39   Ct Head Wo Contrast  Result Date: 11/30/2018 CLINICAL DATA:  Fall.  Neck pain.  History of multiple sclerosis. EXAM: CT HEAD WITHOUT CONTRAST CT CERVICAL SPINE WITHOUT CONTRAST TECHNIQUE: Multidetector CT imaging of the head and cervical spine was performed following the standard protocol without intravenous contrast. Multiplanar CT image reconstructions of the cervical spine were also generated. COMPARISON:  CT head 10/28/2018 FINDINGS: CT HEAD FINDINGS Brain: Moderate atrophy. Moderate chronic changes in the white matter consistent with multiple sclerosis. No acute infarct, hemorrhage, or mass. No midline shift. Vascular: Negative for hyperdense vessel Skull: Negative for skull fracture Sinuses/Orbits: Negative Other: None CT CERVICAL SPINE FINDINGS Alignment: Normal Skull base and vertebrae: Negative for fracture Soft tissues and spinal canal: 10 mm right thyroid nodule. No adenopathy or mass. Disc levels: Mild disc and facet degeneration throughout the cervical spine with disc space narrowing and spurring most prominent C5-6 and C6-7 Upper chest: Negative Other: None IMPRESSION: 1. No acute intracranial abnormality. Chronic white matter changes compatible with multiple sclerosis 2. Negative for cervical spine fracture.  Cervical spondylosis 3. 10 mm right thyroid nodule. Electronically Signed   By: Marlan Palau M.D.   On: 11/30/2018 12:08   Ct Chest Wo Contrast  Result Date: 11/30/2018 CLINICAL DATA:  Left anterior chest wall pain and bruising after falling 1 week ago. EXAM: CT CHEST WITHOUT CONTRAST TECHNIQUE: Multidetector CT imaging of the chest was performed  following the standard protocol without IV contrast. COMPARISON:  Radiographs today.  CT 09/25/2018. FINDINGS: Cardiovascular: No significant vascular findings on noncontrast imaging. The heart size is normal. There is no pericardial effusion. Mediastinum/Nodes: There are no enlarged mediastinal, hilar or axillary lymph nodes.Hilar assessment is limited by the lack of intravenous contrast, although the hilar contours appear unchanged. Tiny probable right thyroid cyst measuring 9 mm on image 36/2, unlikely to be clinically significant and not needing additional evaluation. Small hiatal hernia with postsurgical changes at the GE junction. Lungs/Pleura: There is no pleural effusion or pneumothorax. The septal thickening and patchy ground-glass opacities present on the prior examination have resolved. There is mild bronchiectasis in the right middle lobe and mild scarring in the lingula. Upper abdomen: Post bariatric surgical changes in the stomach. No acute findings. Musculoskeletal/Chest wall: Old rib fractures are again noted bilaterally. No definite acute rib fractures. There is a minimally displaced fracture of the sternal manubrium, best seen on the sagittal images (76/6). No significant retrosternal hematoma. Chronic superior endplate compression deformity at L1 is unchanged. IMPRESSION: 1. Minimally  displaced fracture of the sternal manubrium. 2. No evidence of acute rib fracture. There are old rib fractures bilaterally. 3. Resolution of inflammatory changes or edema seen on previous examination. The lungs are now clear. Electronically Signed   By: Carey Bullocks M.D.   On: 11/30/2018 13:55   Ct Cervical Spine Wo Contrast  Result Date: 11/30/2018 CLINICAL DATA:  Fall.  Neck pain.  History of multiple sclerosis. EXAM: CT HEAD WITHOUT CONTRAST CT CERVICAL SPINE WITHOUT CONTRAST TECHNIQUE: Multidetector CT imaging of the head and cervical spine was performed following the standard protocol without intravenous  contrast. Multiplanar CT image reconstructions of the cervical spine were also generated. COMPARISON:  CT head 10/28/2018 FINDINGS: CT HEAD FINDINGS Brain: Moderate atrophy. Moderate chronic changes in the white matter consistent with multiple sclerosis. No acute infarct, hemorrhage, or mass. No midline shift. Vascular: Negative for hyperdense vessel Skull: Negative for skull fracture Sinuses/Orbits: Negative Other: None CT CERVICAL SPINE FINDINGS Alignment: Normal Skull base and vertebrae: Negative for fracture Soft tissues and spinal canal: 10 mm right thyroid nodule. No adenopathy or mass. Disc levels: Mild disc and facet degeneration throughout the cervical spine with disc space narrowing and spurring most prominent C5-6 and C6-7 Upper chest: Negative Other: None IMPRESSION: 1. No acute intracranial abnormality. Chronic white matter changes compatible with multiple sclerosis 2. Negative for cervical spine fracture.  Cervical spondylosis 3. 10 mm right thyroid nodule. Electronically Signed   By: Marlan Palau M.D.   On: 11/30/2018 12:08    Procedures Procedures (including critical care time)  Medications Ordered in ED Medications - No data to display   Initial Impression / Assessment and Plan / ED Course  I have reviewed the triage vital signs and the nursing notes.  Pertinent labs & imaging results that were available during my care of the patient were reviewed by me and considered in my medical decision making (see chart for details).  58 year old female appears chronically ill presents for evaluation of mechanical fall and malodorous urine.  Afebrile, nonseptic appearing.  Patient with diagnosis of MS.  Husband states patient supposed to be using walker for ambulation, however she does not use this.  She also frequently uses alcohol, 2-3 beers daily.  Last used yesterday evening.  Not appear in withdrawal.  Husband states patient falls almost weekly secondary to lower extremity weakness.   Patient states that her weakness is at her baseline, however per husband is concerned that patient hit her head.  Most recent fall 1 week ago that he knows about.  Patient does have old ecchymosis to parietal region of scalp without the scalp deformity, laceration or abrasions.  No facial pain, facial step-offs or crepitus.  She also has ecchymosis to her right breast as well as her bilateral forearms.  She does have some bony tenderness to her right shoulder.  She has full range of motion bilateral upper and lower extremities without difficulty.  Her abdomen is soft without rebound or guarding.  Normoactive bowel sounds.  She does have some generalized tenderness to her epigastric region, however patient states she has chronic pancreatitis secondary to her alcohol use.  She denied any emesis or diarrhea.  Patient states she has incontinence at baseline from her MS.  She has had some malodorous urine x1 week.  Recently had urinalysis with culture 3 days ago which was negative for UTI.  States she does have mild head pain and points to the area with ecchymosis.  She denies anticoagulation.  She denies midline  cervical, thoracic or lumbar back pain.  She has full range of motion, however has difficulty at baseline with sitting up secondary to her MS. patient does not have any evidence of skin ulcerations or breakdown from limited mobilization.  She has 2+ bilateral pitting edema to her lower extremities up to mid shins which patient states is at her baseline.  No shortness of breath to suggest pulmonary edema.  Previously told she could take Lasix from PCP when she has additional swelling, however patient states she does not feel she needs to take Lasix at this time given this is "not bad compared usually is."  Patient with normal neurologic exam without neurologic deficits.  She has had chest pain, however states this is chest wall pain.  Pain is not exertional in nature, no associated lightheadedness, dizziness,  radiation to her left arm or jaw as well as no nausea. HEART score 3- Age, Non specific EKG, Risk factors, Wells criteria low risk.  Will obtain labs, imaging and reevaluate.  Urinalysis negative for infection, will culture.  CBC without leukocytosis, hemoglobin 11.9, previous 12.1, 11.5 at patient's baseline.  Metabolic panel without electrolyte abnormality, AST elevated at 49, previous 55, 58, likely due to patient's chronic alcohol use.  Troponin negative, given patient has had symptoms greater than 24 hours do not feel patient needs delta troponin at this time.  Feel her chest pain is likely due to chest wall pain from falling. EKG with T wave inversion however similar to previous EKG. No new EKG changes. No STEMI.  Lipase 75, elevated from 45- 3 days ago, likely due to patient chronic alcohol use. Low suspicion for complication from pancreatitis as she has minimal pain to her epigastric region.  Imaging with minimally displaced fracture of the sternal manubrium. No additional imaging findings. Discussed results with attending, Dr. Rush Landmark. Recommends Outpatient follow up. No evidence of lung contusion, pneumothorax or hemothorax. Will dc home with spirometer and strict return precautions. Given last fall 1 week ago do not feel patient needs inpatient management. Will follow up with Orthopedics outpatient. Patient requesting dc home at this time. She does not appear in fluid overload.  I do feel that patient would benefit from some sort of home health evaluation given her frequent falls and weakness secondary to her MS.  Discussed with patient husband to follow up with patient's PCP for evaluation for this. Has been able to tolerate p.o. intake in department without difficulty. States she does not plan on cessation of alcohol. Does not want Librium taper at this time.  Does not appear in active alcohol withdrawal.  Patient hemodynamicly stable and appropriate for DC home this time.  Normal neurologic exam.   Normal musculoskeletal exam.  Low suspicion for ACS, PE, dissection as cause of patient's chest pain.  Likely chest wall pain secondary to fall.  Discussed strict return precautions with patient and husband.  Patient voiced understanding and are agreeable to follow-up. Clinical Course as of Nov 30 1619  Sun Nov 30, 2018  1608 CT Cervical Spine Wo Contrast [BH]  1609 No acute abnormality.  CT Head Wo Contrast [BH]  1609 No acute abnormality.  DG Shoulder Right [BH]  1610 No acute abnormality. Old bilateral rib fractures  DG Ribs Unilateral W/Chest Left [BH]  1610 Minimally displaced sternal manubrium fracture. No acute rib fractures.  CT Chest Wo Contrast [BH]    Clinical Course User Index [BH] Jacaden Forbush A, PA-C     Final Clinical Impressions(s) / ED Diagnoses  Final diagnoses:  Fall, initial encounter  Closed fracture of manubrium, initial encounter  Chest wall pain  Acute pain of right shoulder  Malodorous urine  Elevated lipase    ED Discharge Orders         Ordered    traMADol (ULTRAM) 50 MG tablet  Every 6 hours PRN     11/30/18 1512           Crystelle Ferrufino A, PA-C 11/30/18 1621    Tegeler, Canary Brimhristopher J, MD 11/30/18 [id number]

## 2018-11-30 NOTE — ED Notes (Signed)
Attempted to obtain urine sample, tried bedpan and was unable then tried [facility] and still no sample collected.

## 2018-11-30 NOTE — ED Notes (Signed)
Pt. Reports she drinks every day and has not missed a drink.  Pt. Husband said she drinks every evening poss. Reason for falling.

## 2018-11-30 NOTE — ED Triage Notes (Addendum)
Pt brought in by family with c/o chest pain, head pain, and swelling to B/L legs x 2 weeks. Pt has history of falls and is not using her walking assistive devices at home. Pt has history of UTI that continues.

## 2018-11-30 NOTE — ED Notes (Signed)
Pt. Is clearly A&O has history of MS with multiple falls and alcohol use.  Pt. In no distress.

## 2018-11-30 NOTE — ED Notes (Signed)
Pt. Reports she is here due to chest pain after multiple falls and also has poss. UTI that has already poss. Been dx. By PMD but reports she has more burning and odor.  Pt. Has bruising noted over parts of upper chest and upper L arm.  Pt. Also has a bruise on the L side of her face and a bruise and knot on the back L side of her head.  Pt. Bruises are dated in nature by color of bruising..  the bruises are yellow and brown and black in color.

## 2018-12-01 LAB — URINE CULTURE

## 2018-12-01 MED ORDER — CIPROFLOXACIN HCL 250 MG PO TABS: 250.0000 mg | ORAL_TABLET | Freq: Two times a day (BID) | ORAL | 0 refills | Status: DC

## 2018-12-01 NOTE — Telephone Encounter (Signed)
I saw pt recent ED visit. Her urine culture the other day that I ordered was negative. Does she have worse urinary symptoms now? Based on culture the other day looked like did not need antiobiotics. Though things can change. How is she doing. If still as odor to urine can send in 3 days of low dose cipro pending ED culture results.

## 2018-12-04 ENCOUNTER — Telehealth: Payer: Self-pay | Admitting: Medical

## 2018-12-04 NOTE — Telephone Encounter (Signed)
Pt given lab results per notes of E Saguier on 11/27/2018. Pt verbalized understanding. Pt will not take antibiotic.

## 2018-12-10 ENCOUNTER — Telehealth: Payer: Self-pay | Admitting: Neurology

## 2018-12-10 DIAGNOSIS — F418 Other specified anxiety disorders: Secondary | ICD-10-CM

## 2018-12-10 NOTE — Telephone Encounter (Signed)
Pt called in and stated she would like to discuss her depression with someone, she is currently on cymbalta

## 2018-12-10 NOTE — Telephone Encounter (Signed)
Called, LVM returning pt call 

## 2018-12-11 NOTE — Telephone Encounter (Signed)
I called pt. Pt reports that cymbalta 60mg  daily initially helped with her depression but recently her depression has gotten worse. She doesn't feel like doing anything. She is wondering if there is anything else Dr. Epimenio Foot can offer her to help with her depression.

## 2018-12-11 NOTE — Telephone Encounter (Signed)
Left second message requesting patient to call back.  Provided our current office hours and phone number.

## 2018-12-11 NOTE — Telephone Encounter (Signed)
Pt returned call

## 2018-12-15 NOTE — Telephone Encounter (Signed)
I think we need to have her evaluated by behavioral health (amb referral) to see if something else might help her more

## 2018-12-16 NOTE — Telephone Encounter (Signed)
Called, LVM for pt to call office. 

## 2018-12-16 NOTE — Telephone Encounter (Signed)
Took call from phone staff. Spoke with pt. Relayed Dr. Bonnita Hollow recommendations. She is agreeable to this plan. She had no preference on where to be referred to. Advised her to call back if she does not get a call to schedule appt.

## 2018-12-20 DIAGNOSIS — M9903 Segmental and somatic dysfunction of lumbar region: Secondary | ICD-10-CM | POA: Diagnosis not present

## 2018-12-20 DIAGNOSIS — M9901 Segmental and somatic dysfunction of cervical region: Secondary | ICD-10-CM | POA: Diagnosis not present

## 2018-12-20 DIAGNOSIS — M5417 Radiculopathy, lumbosacral region: Secondary | ICD-10-CM | POA: Diagnosis not present

## 2018-12-20 DIAGNOSIS — M9902 Segmental and somatic dysfunction of thoracic region: Secondary | ICD-10-CM | POA: Diagnosis not present

## 2018-12-23 ENCOUNTER — Telehealth: Payer: Self-pay | Admitting: *Deleted

## 2018-12-23 NOTE — Telephone Encounter (Signed)
LVM for pt to call office. Wanted to know if she still needed appt on 12/25/18. She was just seen 10/28/18 by Dr. Epimenio Foot and he wanted her to f/u in 4 months after than. Appt for 12/25/18 was made back in January and appears was not cx.

## 2018-12-24 NOTE — Telephone Encounter (Signed)
Pt returned call and stated that she is ok with the appt for the 7th to be cancelled.

## 2018-12-24 NOTE — Telephone Encounter (Signed)
Jael- I cx appt on 12/25/18 but can you please call her back and schedule a follow up around 02/27/2019? Dr. Epimenio Foot wanted a 4 month f/u scheduled after she was seen on 10/28/18. I see that she now has no f/u scheduled, thank you!

## 2018-12-25 ENCOUNTER — Ambulatory Visit: Payer: Federal, State, Local not specified - PPO | Admitting: Neurology

## 2018-12-25 NOTE — Telephone Encounter (Signed)
Katie- Jael unable to call pt/having phone issues. Can you call pt and schedule a f/u around 02/27/19? Thank you

## 2018-12-25 NOTE — Telephone Encounter (Signed)
12/25/18 - LVM to schedule patient follow-up for around 02/27/19.

## 2019-01-05 ENCOUNTER — Other Ambulatory Visit: Payer: Self-pay | Admitting: Neurology

## 2019-01-05 ENCOUNTER — Ambulatory Visit: Payer: Self-pay | Admitting: *Deleted

## 2019-01-05 ENCOUNTER — Emergency Department (HOSPITAL_BASED_OUTPATIENT_CLINIC_OR_DEPARTMENT_OTHER): Payer: Federal, State, Local not specified - PPO

## 2019-01-05 ENCOUNTER — Encounter (HOSPITAL_BASED_OUTPATIENT_CLINIC_OR_DEPARTMENT_OTHER): Payer: Self-pay | Admitting: *Deleted

## 2019-01-05 ENCOUNTER — Emergency Department (HOSPITAL_BASED_OUTPATIENT_CLINIC_OR_DEPARTMENT_OTHER)
Admission: EM | Admit: 2019-01-05 | Discharge: 2019-01-05 | Disposition: A | Discharge status: Home / Self Care | Payer: Federal, State, Local not specified - PPO | Attending: Emergency Medicine | Admitting: Emergency Medicine

## 2019-01-05 ENCOUNTER — Other Ambulatory Visit: Payer: Self-pay

## 2019-01-05 DIAGNOSIS — R609 Edema, unspecified: Secondary | ICD-10-CM | POA: Diagnosis not present

## 2019-01-05 DIAGNOSIS — J45909 Unspecified asthma, uncomplicated: Secondary | ICD-10-CM | POA: Diagnosis not present

## 2019-01-05 DIAGNOSIS — N390 Urinary tract infection, site not specified: Secondary | ICD-10-CM | POA: Diagnosis not present

## 2019-01-05 DIAGNOSIS — R600 Localized edema: Secondary | ICD-10-CM | POA: Insufficient documentation

## 2019-01-05 DIAGNOSIS — I10 Essential (primary) hypertension: Secondary | ICD-10-CM | POA: Insufficient documentation

## 2019-01-05 DIAGNOSIS — Z79899 Other long term (current) drug therapy: Secondary | ICD-10-CM | POA: Diagnosis not present

## 2019-01-05 DIAGNOSIS — E876 Hypokalemia: Secondary | ICD-10-CM | POA: Diagnosis not present

## 2019-01-05 LAB — CBC WITH DIFFERENTIAL/PLATELET
Abs Immature Granulocytes: 0.02 10*3/uL (ref 0.00–0.07)
Basophils Absolute: 0.1 10*3/uL (ref 0.0–0.1)
Basophils Relative: 2 %
Eosinophils Absolute: 0.1 10*3/uL (ref 0.0–0.5)
Eosinophils Relative: 2 %
HCT: 32.9 % — ABNORMAL LOW (ref 36.0–46.0)
Hemoglobin: 10.9 g/dL — ABNORMAL LOW (ref 12.0–15.0)
Immature Granulocytes: 0 %
Lymphocytes Relative: 33 %
Lymphs Abs: 1.6 10*3/uL (ref 0.7–4.0)
MCH: 33.6 pg (ref 26.0–34.0)
MCHC: 33.1 g/dL (ref 30.0–36.0)
MCV: 101.5 fL — ABNORMAL HIGH (ref 80.0–100.0)
Monocytes Absolute: 0.7 10*3/uL (ref 0.1–1.0)
Monocytes Relative: 14 %
Neutro Abs: 2.3 10*3/uL (ref 1.7–7.7)
Neutrophils Relative %: 49 %
Platelets: 305 10*3/uL (ref 150–400)
RBC: 3.24 MIL/uL — ABNORMAL LOW (ref 3.87–5.11)
RDW: 15.8 % — ABNORMAL HIGH (ref 11.5–15.5)
WBC: 4.7 10*3/uL (ref 4.0–10.5)
nRBC: 0 % (ref 0.0–0.2)

## 2019-01-05 LAB — URINALYSIS, MICROSCOPIC (REFLEX)

## 2019-01-05 LAB — URINALYSIS, ROUTINE W REFLEX MICROSCOPIC
Bilirubin Urine: NEGATIVE
Glucose, UA: NEGATIVE mg/dL
Hgb urine dipstick: NEGATIVE
Ketones, ur: NEGATIVE mg/dL
Leukocytes,Ua: NEGATIVE
Nitrite: POSITIVE — AB
Protein, ur: NEGATIVE mg/dL
Specific Gravity, Urine: 1.02 (ref 1.005–1.030)
pH: 5.5 (ref 5.0–8.0)

## 2019-01-05 LAB — COMPREHENSIVE METABOLIC PANEL
ALT: 49 U/L — ABNORMAL HIGH (ref 0–44)
AST: 107 U/L — ABNORMAL HIGH (ref 15–41)
Albumin: 3.5 g/dL (ref 3.5–5.0)
Alkaline Phosphatase: 109 U/L (ref 38–126)
Anion gap: 9 (ref 5–15)
BUN: 28 mg/dL — ABNORMAL HIGH (ref 6–20)
CO2: 27 mmol/L (ref 22–32)
Calcium: 8.5 mg/dL — ABNORMAL LOW (ref 8.9–10.3)
Chloride: 96 mmol/L — ABNORMAL LOW (ref 98–111)
Creatinine, Ser: 0.74 mg/dL (ref 0.44–1.00)
GFR calc Af Amer: >60 mL/min (ref >60)
GFR calc non Af Amer: >60 mL/min (ref >60)
Glucose, Bld: 99 mg/dL (ref 70–99)
Potassium: 3.4 mmol/L — ABNORMAL LOW (ref 3.5–5.1)
Sodium: 132 mmol/L — ABNORMAL LOW (ref 135–145)
Total Bilirubin: 0.8 mg/dL (ref 0.3–1.2)
Total Protein: 6.4 g/dL — ABNORMAL LOW (ref 6.5–8.1)

## 2019-01-05 LAB — TROPONIN I: Troponin I: <0.03 ng/mL (ref <0.03)

## 2019-01-05 LAB — BRAIN NATRIURETIC PEPTIDE: B Natriuretic Peptide: 19.7 pg/mL (ref 0.0–100.0)

## 2019-01-05 MED ORDER — FUROSEMIDE 20 MG PO TABS: 20.0000 mg | ORAL_TABLET | Freq: Every day | ORAL | 0 refills | Status: DC

## 2019-01-05 MED ORDER — SULFAMETHOXAZOLE-TRIMETHOPRIM 800-160 MG PO TABS: 1.0000 | ORAL_TABLET | Freq: Two times a day (BID) | ORAL | 0 refills | Status: DC

## 2019-01-05 MED ORDER — POTASSIUM CHLORIDE CRYS ER 20 MEQ PO TBCR: 20.0000 meq | EXTENDED_RELEASE_TABLET | Freq: Two times a day (BID) | ORAL | 0 refills | Status: DC

## 2019-01-05 NOTE — Discharge Instructions (Signed)
You are carrying extra fluid, however your potassium is also low.  Take the 5 days of Lasix and increase the potassium.  Follow-up with your primary care doctor soon as possible.

## 2019-01-05 NOTE — ED Provider Notes (Signed)
[facility] EMERGENCY DEPARTMENT Provider Note   CSN: [id number] Arrival date & time: 01/05/19  1159    History   Chief Complaint Chief Complaint  Patient presents with  . Leg Swelling    HPI Catherine Hubbard is a 58 y.o. female.     HPI Patient presents with swelling to her lower legs and feet.  Has been going on for the last few days.  History of edema.  Has been on fluid pills plus had some adjustment due to electrolyte abnormalities.  Unknown likely swelling now.  Some pain.  Occasional mild shortness of breath.  No fevers.  No cough.  History of multiple sclerosis.  Not been running with sick.  No abdominal pain. Past Medical History:  Diagnosis Date  . Asthma   . Esophagitis   . GERD (gastroesophageal reflux disease)   . Headache   . Hearing loss   . Hypertension   . Multiple sclerosis (HCC)   . Vision abnormalities     Patient Active Problem List   Diagnosis Date Noted  . Multiple sclerosis exacerbation (HCC) 11/16/2018  . Exacerbation of multiple sclerosis (HCC) 11/16/2018  . Pneumonia 09/26/2018  . Elevated troponin 09/26/2018  . Acute hypokalemia 09/25/2018  . Arthritis of carpometacarpal joint 03/27/2018  . Nocturnal leg cramps 03/26/2018  . Syncope due to orthostatic hypotension 02/12/2018  . Diarrhea 02/08/2018  . Acute lower UTI 02/08/2018  . Hematoma of scalp 02/08/2018  . Neck pain 01/20/2018  . Foreign body in esophagus   . Esophageal stricture   . Intractable nausea and vomiting 01/31/2017  . Dysphagia   . Severe protein-calorie malnutrition (HCC)   . Nausea & vomiting 01/30/2017  . Hypokalemia 01/30/2017  . Acute kidney injury (HCC) 01/30/2017  . Cystitis 01/30/2017  . Costochondritis 05/11/2016  . Trochanteric bursitis of both hips 03/28/2016  . Upper back pain 03/28/2016  . Bilateral low back pain with bilateral sciatica 07/13/2015  . Right hip pain 07/13/2015  . Lumbar radicular pain 03/21/2015  . Other fatigue  01/11/2015  . Dysesthesia 01/11/2015  . Urinary urgency 01/11/2015  . Multiple sclerosis (HCC) 10/06/2014  . Numbness 10/06/2014  . Ataxic gait 10/06/2014  . High risk medication use 10/06/2014  . Gastric bypass status for obesity 10/06/2014  . Cognitive changes 10/06/2014  . Depression with anxiety 10/06/2014  . Restless leg 10/06/2014  . Insomnia 10/06/2014  . Essential hypertension 10/06/2014  . Difficulty hearing 01/13/2014  . Other muscle spasm 01/13/2014    Past Surgical History:  Procedure Laterality Date  . ANKLE FRACTURE SURGERY Bilateral   . CARPAL TUNNEL RELEASE Bilateral   . COLONOSCOPY  11/2016   multiple  . ESOPHAGOGASTRODUODENOSCOPY  11/2016  . ESOPHAGOGASTRODUODENOSCOPY N/A 02/01/2017   Procedure: ESOPHAGOGASTRODUODENOSCOPY (EGD);  Surgeon: Iva Boop, MD;  Location: [facility] ENDOSCOPY;  Service: Endoscopy;  Laterality: N/A;  . ESOPHAGOGASTRODUODENOSCOPY (EGD) WITH PROPOFOL N/A 02/04/2017   Procedure: ESOPHAGOGASTRODUODENOSCOPY (EGD) WITH PROPOFOL;  Surgeon: Meryl Dare, MD;  Location: [facility] ENDOSCOPY;  Service: Endoscopy;  Laterality: N/A;  . LAPAROSCOPIC GASTRIC SLEEVE RESECTION  2011  . ULNAR NERVE REPAIR Bilateral      OB History   No obstetric history on file.      Home Medications    Prior to Admission medications   Medication Sig Start Date End Date Taking? Authorizing Provider  albuterol (PROVENTIL) (2.5 MG/3ML) 0.083% nebulizer solution Take 3 mLs (2.5 mg total) by nebulization every 6 (six) hours as needed for wheezing or shortness of  breath. 10/31/18   Saguier, Ramon DredgeEdward, PA-C  baclofen (LIORESAL) 10 MG tablet Take 1 tablet (10 mg total) by mouth 3 (three) times daily. Patient taking differently: Take 10 mg by mouth at bedtime.  08/26/18   Sater, Pearletha Furlichard A, MD  ciprofloxacin (CIPRO) 250 MG tablet Take 1 tablet (250 mg total) by mouth 2 (two) times daily. 12/01/18   Saguier, Ramon DredgeEdward, PA-C  DULoxetine (CYMBALTA) 60 MG capsule Take 1 capsule (60 mg total)  by mouth daily. 08/26/18   Sater, Pearletha Furlichard A, MD  hydrochlorothiazide (HYDRODIURIL) 25 MG tablet 1 tab po daily as needed severe pedal edema. Patient not taking: Reported on 11/16/2018 10/31/18   Saguier, Ramon DredgeEdward, PA-C  losartan (COZAAR) 25 MG tablet Take 1 tablet (25 mg total) by mouth daily. 10/31/18   Saguier, Ramon DredgeEdward, PA-C  modafinil (PROVIGIL) 200 MG tablet TAKE 1 TABLET BY MOUTH EVERY DAY Patient taking differently: Take 200 mg by mouth daily as needed (Sleepyness).  11/03/18   Levert FeinsteinYan, Yijun, MD  Multiple Vitamin (MULTIVITAMIN WITH MINERALS) TABS tablet Take 1 tablet by mouth daily.    [provider]  ocrelizumab 600 mg in sodium chloride 0.9 % 500 mL Inject 600 mg into the vein every 6 (six) months. Last infusion May 2019 - due in December 2019    [provider]  olmesartan (BENICAR) 20 MG tablet Take 1 tablet (20 mg total) by mouth daily. Patient not taking: Reported on 11/16/2018 11/06/18   Saguier, Ramon DredgeEdward, PA-C  omeprazole (PRILOSEC) 20 MG capsule Take 2 capsules (40 mg total) by mouth 2 (two) times daily. 11/13/18   Saguier, Ramon DredgeEdward, PA-C  ondansetron (ZOFRAN ODT) 8 MG disintegrating tablet Take 1 tablet (8 mg total) by mouth every 8 (eight) hours as needed for nausea or vomiting. 11/11/18   Saguier, Ramon DredgeEdward, PA-C  oxybutynin (DITROPAN) 5 MG tablet Take 1 tablet (5 mg total) by mouth 2 (two) times daily. Patient not taking: Reported on 11/16/2018 08/26/18   Sater, Pearletha Furlichard A, MD  potassium chloride SA (K-DUR,KLOR-CON) 20 MEQ tablet Take 1 tablet (20 mEq total) by mouth 2 (two) times daily. Patient not taking: Reported on 11/16/2018 10/28/18   Palumbo, April, MD  sucralfate (CARAFATE) 1 GM/10ML suspension Take 10 mLs (1 g total) by mouth 4 (four) times daily -  with meals and at bedtime. Patient not taking: Reported on 11/16/2018 10/28/18   Palumbo, April, MD  traMADol (ULTRAM) 50 MG tablet Take 1 tablet (50 mg total) by mouth every 6 (six) hours as needed. 11/30/18   Henderly, Britni A, PA-C     Family History Family History  Problem Relation Age of Onset  . Cancer Mother   . Stroke Mother   . Cancer Father     Social History Social History   Tobacco Use  . Smoking status: Never Smoker  . Smokeless tobacco: Never Used  . Tobacco comment: second hand smoker when young. Heavy exposure.  Substance Use Topics  . Alcohol use: Not Currently    Alcohol/week: 0.0 standard drinks    Comment: Sts. she drinks beer, liquor daily, varying amts/fim  . Drug use: No     Allergies   Ibuprofen and Nsaids   Review of Systems Review of Systems  Constitutional: Negative for appetite change.  HENT: Negative for congestion.   Respiratory: Positive for shortness of breath.   Cardiovascular: Positive for leg swelling. Negative for chest pain.  Gastrointestinal: Negative for abdominal pain.  Genitourinary: Negative for dysuria.  Musculoskeletal: Negative for back pain.  Skin:  Negative for rash.  Neurological: Negative for weakness.  Hematological: Negative for adenopathy.  Psychiatric/Behavioral: Negative for confusion.     Physical Exam Updated Vital Signs BP 124/87   Pulse 87   Temp 98.2 F (36.8 C) (Oral)   Resp 18   Ht 5\' 5"  (1.651 m)   Wt 70 kg   LMP 08/13/2014 Comment: very few periods  SpO2 100%   BMI 25.69 kg/m   Physical Exam Vitals signs and nursing note reviewed.  HENT:     Head: Normocephalic.  Neck:     Musculoskeletal: No muscular tenderness.  Cardiovascular:     Rate and Rhythm: Normal rate.  Pulmonary:     Breath sounds: No wheezing, rhonchi or rales.  Abdominal:     Tenderness: There is no abdominal tenderness.  Musculoskeletal:     Right lower leg: Edema present.     Left lower leg: Edema present.  Neurological:     Mental Status: She is alert.      ED Treatments / Results  Labs (all labs ordered are listed, but only abnormal results are displayed) Labs Reviewed  URINALYSIS, ROUTINE W REFLEX MICROSCOPIC - Abnormal; Notable for the  following components:      Result Value   Nitrite POSITIVE (*)    All other components within normal limits  CBC WITH DIFFERENTIAL/PLATELET - Abnormal; Notable for the following components:   RBC 3.24 (*)    Hemoglobin 10.9 (*)    HCT 32.9 (*)    MCV 101.5 (*)    RDW 15.8 (*)    All other components within normal limits  URINALYSIS, MICROSCOPIC (REFLEX) - Abnormal; Notable for the following components:   Bacteria, UA MANY (*)    All other components within normal limits  URINE CULTURE  COMPREHENSIVE METABOLIC PANEL  TROPONIN I  BRAIN NATRIURETIC PEPTIDE    EKG None  Radiology Dg Chest Portable 1 View  Result Date: 01/05/2019 CLINICAL DATA:  Leg and foot swelling for 3 days. History of hypertension and asthma. EXAM: PORTABLE CHEST 1 VIEW COMPARISON:  Radiographs and CT 11/30/2018. FINDINGS: 1231 hours. Patient rotation to the left. The heart size and mediastinal contours are stable. The lungs appear clear. There is no pleural effusion or pneumothorax. Multiple old rib fractures are noted bilaterally. IMPRESSION: No active cardiopulmonary process.  Old rib fractures bilaterally. Electronically Signed   By: Carey Bullocks M.D.   On: 01/05/2019 13:00    Procedures Procedures (including critical care time)  Medications Ordered in ED Medications - No data to display   Initial Impression / Assessment and Plan / ED Course  I have reviewed the triage vital signs and the nursing notes.  Pertinent labs & imaging results that were available during my care of the patient were reviewed by me and considered in my medical decision making (see chart for details).        Patient bilateral peripheral edema.  Also some hypokalemia and hyponatremia.  Feels the patient would benefit from some diuresis.  We will give short course of Lasix and give more potassium.  Will need further supplementation of potassium due to the Lasix.  However has had issues with hyperkalemia in the past will need  outpatient follow-up.  We will follow-up with PCP as soon as possible.  Discharge home.  Also has UTI.  Culture sent.  Will treat with Bactrim.  Final Clinical Impressions(s) / ED Diagnoses   Final diagnoses:  None    ED Discharge Orders  None       Benjiman Core, MD 01/05/19 1520

## 2019-01-05 NOTE — Telephone Encounter (Signed)
Pt called with complaints of bilateral leg and foot swelling for 3 days; the pt says that she took one hctz on 01/04/19 but has no relief in symptoms; recommendations made per nurse triage protocol; the pt verbalizes understanding, and will proceed to the ED; she normally sees [facility], [employer]; will route to the office for notification.   Reason for Disposition . [1] Difficulty breathing with exertion (e.g., walking) AND [2] new onset or worsening  Answer Assessment - Initial Assessment Questions 1. ONSET: "When did the swelling start?" (e.g., minutes, hours, days)     01/02/2019 2. LOCATION: "What part of the leg is swollen?"  "Are both legs swollen or just one leg?"     Bilateral legs knee to foot 3. SEVERITY: "How bad is the swelling?" (e.g., localized; mild, moderate, severe)  - Localized - small area of swelling localized to one leg  - MILD pedal edema - swelling limited to foot and ankle, pitting edema < 1/4 inch (6 mm) deep, rest and elevation eliminate most or all swelling  - MODERATE edema - swelling of lower leg to knee, pitting edema > 1/4 inch (6 mm) deep, rest and elevation only partially reduce swelling  - SEVERE edema - swelling extends above knee, facial or hand swelling present      Moderate to severe 4. REDNESS: "Does the swelling look red or infected?"     "all kinds of funny colors", red and bluish purple 5. PAIN: "Is the swelling painful to touch?" If so, ask: "How painful is it?"   (Scale 1-10; mild, moderate or severe)    "15" out of 10 6. FEVER: "Do you have a fever?" If so, ask: "What is it, how was it measured, and when did it start?"      Sweating and feels hot temp 98.7 7. CAUSE: "What do you think is causing the leg swelling?"     Not sure; thought it was because sitting too much 8. MEDICAL HISTORY: "Do you have a history of heart failure, kidney disease, liver failure, or cancer?"     no 9. RECURRENT SYMPTOM: "Have you had leg swelling before?" If  so, ask: "When was the last time?" "What happened that time?"    Yes, off and on not sure why 10. OTHER SYMPTOMS: "Do you have any other symptoms?" (e.g., chest pain, difficulty breathing)       Shortness of breath, tired, chest heaviness (inhaler and nebulizer); also having allergy symptoms (sneezing, red eyes) 11. PREGNANCY: "Is there any chance you are pregnant?" "When was your last menstrual period?"      No, no  Protocols used: LEG SWELLING AND EDEMA-A-AH

## 2019-01-05 NOTE — Telephone Encounter (Signed)
Pt is up to date on her appts. Pt is due for a refill. [place] Controlled Substance Registry checked.

## 2019-01-05 NOTE — ED Triage Notes (Signed)
Swelling to her legs and feet x 3 days.

## 2019-01-06 ENCOUNTER — Emergency Department (HOSPITAL_BASED_OUTPATIENT_CLINIC_OR_DEPARTMENT_OTHER)
Admission: EM | Admit: 2019-01-06 | Discharge: 2019-01-06 | Disposition: A | Discharge status: Home / Self Care | Payer: Federal, State, Local not specified - PPO | Attending: Emergency Medicine | Admitting: Emergency Medicine

## 2019-01-06 ENCOUNTER — Encounter (HOSPITAL_BASED_OUTPATIENT_CLINIC_OR_DEPARTMENT_OTHER): Payer: Self-pay | Admitting: Emergency Medicine

## 2019-01-06 DIAGNOSIS — R252 Cramp and spasm: Secondary | ICD-10-CM | POA: Diagnosis not present

## 2019-01-06 DIAGNOSIS — K859 Acute pancreatitis without necrosis or infection, unspecified: Secondary | ICD-10-CM | POA: Insufficient documentation

## 2019-01-06 DIAGNOSIS — I10 Essential (primary) hypertension: Secondary | ICD-10-CM | POA: Diagnosis not present

## 2019-01-06 DIAGNOSIS — R1084 Generalized abdominal pain: Secondary | ICD-10-CM | POA: Diagnosis not present

## 2019-01-06 DIAGNOSIS — R748 Abnormal levels of other serum enzymes: Secondary | ICD-10-CM | POA: Diagnosis not present

## 2019-01-06 DIAGNOSIS — R0602 Shortness of breath: Secondary | ICD-10-CM | POA: Insufficient documentation

## 2019-01-06 DIAGNOSIS — J45909 Unspecified asthma, uncomplicated: Secondary | ICD-10-CM | POA: Diagnosis not present

## 2019-01-06 DIAGNOSIS — Z20828 Contact with and (suspected) exposure to other viral communicable diseases: Secondary | ICD-10-CM | POA: Insufficient documentation

## 2019-01-06 DIAGNOSIS — Z79899 Other long term (current) drug therapy: Secondary | ICD-10-CM | POA: Diagnosis not present

## 2019-01-06 DIAGNOSIS — R259 Unspecified abnormal involuntary movements: Secondary | ICD-10-CM | POA: Diagnosis not present

## 2019-01-06 DIAGNOSIS — Z03818 Encounter for observation for suspected exposure to other biological agents ruled out: Secondary | ICD-10-CM | POA: Diagnosis not present

## 2019-01-06 HISTORY — DX: Acute pancreatitis without necrosis or infection, unspecified: K85.90

## 2019-01-06 LAB — COMPREHENSIVE METABOLIC PANEL
ALT: 73 U/L — ABNORMAL HIGH (ref 0–44)
AST: 212 U/L — ABNORMAL HIGH (ref 15–41)
Albumin: 3.8 g/dL (ref 3.5–5.0)
Alkaline Phosphatase: 129 U/L — ABNORMAL HIGH (ref 38–126)
Anion gap: 9 (ref 5–15)
BUN: 33 mg/dL — ABNORMAL HIGH (ref 6–20)
CO2: 30 mmol/L (ref 22–32)
Calcium: 9 mg/dL (ref 8.9–10.3)
Chloride: 95 mmol/L — ABNORMAL LOW (ref 98–111)
Creatinine, Ser: 0.85 mg/dL (ref 0.44–1.00)
GFR calc Af Amer: >60 mL/min (ref >60)
GFR calc non Af Amer: >60 mL/min (ref >60)
Glucose, Bld: 110 mg/dL — ABNORMAL HIGH (ref 70–99)
Potassium: 3.9 mmol/L (ref 3.5–5.1)
Sodium: 134 mmol/L — ABNORMAL LOW (ref 135–145)
Total Bilirubin: 0.7 mg/dL (ref 0.3–1.2)
Total Protein: 7 g/dL (ref 6.5–8.1)

## 2019-01-06 LAB — SARS CORONAVIRUS 2 AG (30 MIN TAT): SARS Coronavirus 2 Ag: NEGATIVE

## 2019-01-06 LAB — LIPASE, BLOOD: Lipase: 117 U/L — ABNORMAL HIGH (ref 11–51)

## 2019-01-06 MED ORDER — FENTANYL CITRATE (PF) 100 MCG/2ML IJ SOLN
50.0000 ug | Freq: Once | INTRAMUSCULAR | Status: AC
  Administered 2019-01-06: 50 ug via INTRAVENOUS
  Filled 2019-01-06: qty 2

## 2019-01-06 MED ORDER — ONDANSETRON HCL 4 MG/2ML IJ SOLN
4.0000 mg | Freq: Once | INTRAMUSCULAR | Status: AC
  Administered 2019-01-06: 03:00:00 4 mg via INTRAVENOUS
  Filled 2019-01-06: qty 2

## 2019-01-06 MED ORDER — TRAMADOL HCL 50 MG PO TABS: 50.0000 mg | ORAL_TABLET | Freq: Four times a day (QID) | ORAL | 0 refills | Status: DC | PRN

## 2019-01-06 MED ORDER — ONDANSETRON 8 MG PO TBDP: 8.0000 mg | ORAL_TABLET | Freq: Three times a day (TID) | ORAL | 0 refills | Status: DC | PRN

## 2019-01-06 MED ORDER — SUCRALFATE 1 GM/10ML PO SUSP
1.0000 g | Freq: Once | ORAL | Status: AC
  Administered 2019-01-06: 1 g via ORAL
  Filled 2019-01-06: qty 10

## 2019-01-06 MED ORDER — PANTOPRAZOLE SODIUM 40 MG IV SOLR
40.0000 mg | Freq: Once | INTRAVENOUS | Status: AC
  Administered 2019-01-06: 40 mg via INTRAVENOUS
  Filled 2019-01-06: qty 40

## 2019-01-06 NOTE — ED Triage Notes (Signed)
Pt states she was here yesterday from about 1130 til 3 in the afternoon  Visitor states the pt lost track of time and thought it was morning instead of night time  Pt is c/o abdominal pain and pain in her legs  Pt has swelling noted to her lower extremities  Pt states her legs started swelling on Friday  Pt states she has a hx of pancreatitis  Pt states when she was here earlier she was diagnosed with a UTI, edema, hypokalemia and hyponatremia

## 2019-01-06 NOTE — ED Provider Notes (Signed)
[facility] Provider Note: Catherine Dell, MD, FACEP  CSN: [id number] MRN: [id number] ARRIVAL: 01/06/19 at 0221 ROOM: MH06/MH06   CHIEF COMPLAINT  Abdominal Pain   HISTORY OF PRESENT ILLNESS  01/06/19 2:35 AM Catherine Hubbard is a 58 y.o. female with a history of multiple sclerosis and pancreatitis.  The pancreatitis first occurred several months ago and she has had mildly elevated as well as normal lipase levels since.  She does not know the cause of her pancreatitis.    She was seen in this ED yesterday for lower extremity edema beginning 4 days ago.  She was diagnosed with lower extremity edema and a urinary tract infection and discharged on Bactrim and Lasix.  She also mentioned being short of breath.  Chest x-ray was negative for acute changes.  Dr. Arlington Calix note from yesterday indicates she denied abdominal pain but the friend who accompanies her states she is not always forthcoming about her symptoms.  She returns complaining of epigastric and right upper quadrant abdominal pain for the past 4 days.  The pain is been intermittent but became persistent this morning.  She rates her pain as a 10 out of 10 and describes it as feeling like previous pancreatitis as well as feeling like someone is pouring acid into her stomach.  It is worse when lying supine or with movement or palpation.  She is not vomiting.  She is also complaining of recurrent spasms in her lower extremities, again present for the past 4 days.  She states the air "feels thick" and requests to use her inhaler.   Past Medical History:  Diagnosis Date  . Asthma   . Esophagitis   . GERD (gastroesophageal reflux disease)   . Headache   . Hearing loss   . Hypertension   . Multiple sclerosis (HCC)   . Pancreatitis   . Vision abnormalities     Past Surgical History:  Procedure Laterality Date  . ANKLE FRACTURE SURGERY Bilateral   . CARPAL TUNNEL RELEASE Bilateral   . CHOLECYSTECTOMY    .  COLONOSCOPY  11/2016   multiple  . ESOPHAGOGASTRODUODENOSCOPY  11/2016  . ESOPHAGOGASTRODUODENOSCOPY N/A 02/01/2017   Procedure: ESOPHAGOGASTRODUODENOSCOPY (EGD);  Surgeon: Iva Boop, MD;  Location: [facility] ENDOSCOPY;  Service: Endoscopy;  Laterality: N/A;  . ESOPHAGOGASTRODUODENOSCOPY (EGD) WITH PROPOFOL N/A 02/04/2017   Procedure: ESOPHAGOGASTRODUODENOSCOPY (EGD) WITH PROPOFOL;  Surgeon: Meryl Dare, MD;  Location: [facility] ENDOSCOPY;  Service: Endoscopy;  Laterality: N/A;  . LAPAROSCOPIC GASTRIC SLEEVE RESECTION  2011  . ULNAR NERVE REPAIR Bilateral     Family History  Problem Relation Age of Onset  . Cancer Mother   . Stroke Mother   . Cancer Father     Social History   Tobacco Use  . Smoking status: Never Smoker  . Smokeless tobacco: Never Used  . Tobacco comment: second hand smoker when young. Heavy exposure.  Substance Use Topics  . Alcohol use: Not Currently    Alcohol/week: 0.0 standard drinks    Comment: Sts. she drinks beer, liquor daily, varying amts/fim  . Drug use: No    Prior to Admission medications   Medication Sig Start Date End Date Taking? Authorizing Provider  albuterol (PROVENTIL) (2.5 MG/3ML) 0.083% nebulizer solution Take 3 mLs (2.5 mg total) by nebulization every 6 (six) hours as needed for wheezing or shortness of breath. 10/31/18   Saguier, Ramon Dredge, PA-C  baclofen (LIORESAL) 10 MG tablet Take 1 tablet (10 mg total) by mouth 3 (three) times  daily. Patient taking differently: Take 10 mg by mouth at bedtime.  08/26/18   Sater, Pearletha Furl, MD  DULoxetine (CYMBALTA) 60 MG capsule Take 1 capsule (60 mg total) by mouth daily. 08/26/18   Sater, Pearletha Furl, MD  furosemide (LASIX) 20 MG tablet Take 1 tablet (20 mg total) by mouth daily. 01/05/19   Benjiman Core, MD  losartan (COZAAR) 25 MG tablet Take 1 tablet (25 mg total) by mouth daily. 10/31/18   Saguier, Ramon Dredge, PA-C  modafinil (PROVIGIL) 200 MG tablet TAKE 1 TABLET BY MOUTH EVERY DAY 01/05/19   Sater, Pearletha Furl,  MD  Multiple Vitamin (MULTIVITAMIN WITH MINERALS) TABS tablet Take 1 tablet by mouth daily.    [provider]  ocrelizumab 600 mg in sodium chloride 0.9 % 500 mL Inject 600 mg into the vein every 6 (six) months. Last infusion May 2019 - due in December 2019    [provider]  omeprazole (PRILOSEC) 20 MG capsule Take 2 capsules (40 mg total) by mouth 2 (two) times daily. 11/13/18   Saguier, Ramon Dredge, PA-C  ondansetron (ZOFRAN ODT) 8 MG disintegrating tablet Take 1 tablet (8 mg total) by mouth every 8 (eight) hours as needed for nausea or vomiting. 11/11/18   Saguier, Ramon Dredge, PA-C  potassium chloride SA (K-DUR) 20 MEQ tablet Take 1 tablet (20 mEq total) by mouth 2 (two) times daily. 01/05/19   Benjiman Core, MD  sulfamethoxazole-trimethoprim (BACTRIM DS) 800-160 MG tablet Take 1 tablet by mouth 2 (two) times daily. 01/05/19   Benjiman Core, MD  traMADol (ULTRAM) 50 MG tablet Take 1 tablet (50 mg total) by mouth every 6 (six) hours as needed. 11/30/18   Henderly, Britni A, PA-C    Allergies Ibuprofen and Nsaids   REVIEW OF SYSTEMS  Negative except as noted here or in the History of Present Illness.   PHYSICAL EXAMINATION  Initial Vital Signs Blood pressure (!) 123/91, pulse 86, temperature 97.6 F (36.4 C), temperature source Oral, resp. rate 18, height  (1.651 m), weight 69.4 kg, last menstrual period 08/13/2014, SpO2 95 %.  Examination General: Well-developed, well-nourished female in no acute distress; appearance consistent with age of record; examined standing as she does not wish to lie supine HENT: normocephalic; atraumatic Eyes: Normal appearance Neck: supple Heart: regular rate and rhythm Lungs: clear to auscultation bilaterally Abdomen: soft; nondistended; epigastric and right upper quadrant tenderness; bowel sounds present Extremities: No deformity; full range of motion; pulses normal; edema of lower legs; tenderness of calf muscles Neurologic: Awake,  alert and oriented; motor function intact in all extremities and symmetric; no facial droop Skin: Warm and dry Psychiatric: Flat affect   RESULTS  Summary of this visit's results, reviewed by myself:   EKG Interpretation  Date/Time:    Ventricular Rate:    PR Interval:    QRS Duration:   QT Interval:    QTC Calculation:   R Axis:     Text Interpretation:        Laboratory Studies: Results for orders placed or performed during the hospital encounter of 01/05/19 (from the past 24 hour(s))  Urinalysis, Routine w reflex microscopic     Status: Abnormal   Collection Time: 01/05/19 12:26 PM  Result Value Ref Range   Color, Urine YELLOW YELLOW   APPearance CLEAR CLEAR   Specific Gravity, Urine 1.020 1.005 - 1.030   pH 5.5 5.0 - 8.0   Glucose, UA NEGATIVE NEGATIVE mg/dL   Hgb urine dipstick NEGATIVE NEGATIVE   Bilirubin Urine  NEGATIVE NEGATIVE   Ketones, ur NEGATIVE NEGATIVE mg/dL   Protein, ur NEGATIVE NEGATIVE mg/dL   Nitrite POSITIVE (A) NEGATIVE   Leukocytes,Ua NEGATIVE NEGATIVE  Urinalysis, Microscopic (reflex)     Status: Abnormal   Collection Time: 01/05/19 12:26 PM  Result Value Ref Range   RBC / HPF 0-5 0 - 5 RBC/hpf   WBC, UA 21-50 0 - 5 WBC/hpf   Bacteria, UA MANY (A) NONE SEEN   Squamous Epithelial / LPF 0-5 0 - 5   WBC Clumps PRESENT   CBC with Differential     Status: Abnormal   Collection Time: 01/05/19  1:05 PM  Result Value Ref Range   WBC 4.7 4.0 - 10.5 K/uL   RBC 3.24 (L) 3.87 - 5.11 MIL/uL   Hemoglobin 10.9 (L) 12.0 - 15.0 g/dL   HCT 64.6 (L) 80.3 - 21.2 %   MCV 101.5 (H) 80.0 - 100.0 fL   MCH 33.6 26.0 - 34.0 pg   MCHC 33.1 30.0 - 36.0 g/dL   RDW 24.8 (H) 25.0 - 03.7 %   Platelets 305 150 - 400 K/uL   nRBC 0.0 0.0 - 0.2 %   Neutrophils Relative % 49 %   Neutro Abs 2.3 1.7 - 7.7 K/uL   Lymphocytes Relative 33 %   Lymphs Abs 1.6 0.7 - 4.0 K/uL   Monocytes Relative 14 %   Monocytes Absolute 0.7 0.1 - 1.0 K/uL   Eosinophils Relative 2 %    Eosinophils Absolute 0.1 0.0 - 0.5 K/uL   Basophils Relative 2 %   Basophils Absolute 0.1 0.0 - 0.1 K/uL   Immature Granulocytes 0 %   Abs Immature Granulocytes 0.02 0.00 - 0.07 K/uL  Comprehensive metabolic panel     Status: Abnormal   Collection Time: 01/05/19  1:05 PM  Result Value Ref Range   Sodium 132 (L) 135 - 145 mmol/L   Potassium 3.4 (L) 3.5 - 5.1 mmol/L   Chloride 96 (L) 98 - 111 mmol/L   CO2 27 22 - 32 mmol/L   Glucose, Bld 99 70 - 99 mg/dL   BUN 28 (H) 6 - 20 mg/dL   Creatinine, Ser 0.48 0.44 - 1.00 mg/dL   Calcium 8.5 (L) 8.9 - 10.3 mg/dL   Total Protein 6.4 (L) 6.5 - 8.1 g/dL   Albumin 3.5 3.5 - 5.0 g/dL   AST 889 (H) 15 - 41 U/L   ALT 49 (H) 0 - 44 U/L   Alkaline Phosphatase 109 38 - 126 U/L   Total Bilirubin 0.8 0.3 - 1.2 mg/dL   GFR calc non Af Amer >60 >60 mL/min   GFR calc Af Amer >60 >60 mL/min   Anion gap 9 5 - 15  Troponin I - Once     Status: None   Collection Time: 01/05/19  1:05 PM  Result Value Ref Range   Troponin I <0.03 <0.03 ng/mL  Brain natriuretic peptide     Status: None   Collection Time: 01/05/19  1:06 PM  Result Value Ref Range   B Natriuretic Peptide 19.7 0.0 - 100.0 pg/mL   Imaging Studies: Dg Chest Portable 1 View  Result Date: 01/05/2019 CLINICAL DATA:  Leg and foot swelling for 3 days. History of hypertension and asthma. EXAM: PORTABLE CHEST 1 VIEW COMPARISON:  Radiographs and CT 11/30/2018. FINDINGS: 1231 hours. Patient rotation to the left. The heart size and mediastinal contours are stable. The lungs appear clear. There is no pleural effusion or pneumothorax. Multiple old  rib fractures are noted bilaterally. IMPRESSION: No active cardiopulmonary process.  Old rib fractures bilaterally. Electronically Signed   By: Carey BullocksWilliam  Veazey M.D.   On: 01/05/2019 13:00   Labs:  Results for orders placed or performed during the hospital encounter of 01/06/19 (from the past 24 hour(s))  Lipase, blood     Status: Abnormal   Collection Time:  01/06/19  2:35 AM  Result Value Ref Range   Lipase 117 (H) 11 - 51 U/L  Comprehensive metabolic panel     Status: Abnormal   Collection Time: 01/06/19  2:35 AM  Result Value Ref Range   Sodium 134 (L) 135 - 145 mmol/L   Potassium 3.9 3.5 - 5.1 mmol/L   Chloride 95 (L) 98 - 111 mmol/L   CO2 30 22 - 32 mmol/L   Glucose, Bld 110 (H) 70 - 99 mg/dL   BUN 33 (H) 6 - 20 mg/dL   Creatinine, Ser 1.610.85 0.44 - 1.00 mg/dL   Calcium 9.0 8.9 - 09.610.3 mg/dL   Total Protein 7.0 6.5 - 8.1 g/dL   Albumin 3.8 3.5 - 5.0 g/dL   AST 045212 (H) 15 - 41 U/L   ALT 73 (H) 0 - 44 U/L   Alkaline Phosphatase 129 (H) 38 - 126 U/L   Total Bilirubin 0.7 0.3 - 1.2 mg/dL   GFR calc non Af Amer >60 >60 mL/min   GFR calc Af Amer >60 >60 mL/min   Anion gap 9 5 - 15  SARS Coronavirus 2 (Hosp order,Performed [facility] lab via [employer] [facility])     Status: None   Collection Time: 01/06/19  3:22 AM  Result Value Ref Range   SARS Coronavirus 2 ([employer] [facility] Now) NEGATIVE NEGATIVE    ED COURSE and MDM  Nursing notes and initial vitals signs, including pulse oximetry, reviewed.  Vitals:   01/06/19 0232 01/06/19 0233  BP: (!) 123/91   Pulse: 86   Resp: 18   Temp: 97.6 F (36.4 C)   TempSrc: Oral   SpO2: 95%   Weight:  69.4 kg  Height:  5\' 5"  (1.651 m)   4:06 AM Nominal pain significantly improved after IV medications and oral Carafate.  Patient complaining of itching but declines Benadryl as she states it would only "knock me out" and the itching helps distract her from the cramping in her legs.  4:31 AM Abdomen is soft, minimally tender in the right upper quadrant.  Patient's lipase and transaminases are mildly elevated.  The elevated lipase is consistent with previous episodes of pancreatitis.  That she got better with Carafate suggest she may have some gastric irritation; she is status post gastric sleeve placement.  Her present complaint is cramping in her legs but states that she has a topical medication at home that  works well and she was encouraged to use it.  We will refill her prescriptions for Zofran and tramadol and advised her to avoid alcohol.  She may be misrepresenting how much alcohol she drinks given her recurrent pancreatitis and elevated transaminases.  PROCEDURES    ED DIAGNOSES     ICD-10-CM   1. 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 Abnormal transaminases R74.8   3. Leg cramps R25.2        Paula LibraMolpus, Labrandon Knoch, MD 01/06/19 [phone]

## 2019-01-07 ENCOUNTER — Ambulatory Visit (INDEPENDENT_AMBULATORY_CARE_PROVIDER_SITE_OTHER): Payer: Federal, State, Local not specified - PPO | Admitting: Family Medicine

## 2019-01-07 ENCOUNTER — Other Ambulatory Visit: Payer: Self-pay

## 2019-01-07 DIAGNOSIS — I10 Essential (primary) hypertension: Secondary | ICD-10-CM | POA: Diagnosis not present

## 2019-01-07 LAB — URINE CULTURE: Culture: >=100000 — AB

## 2019-01-07 MED ORDER — LOSARTAN POTASSIUM 25 MG PO TABS: 25.0000 mg | ORAL_TABLET | Freq: Two times a day (BID) | ORAL | 0 refills | Status: DC

## 2019-01-07 NOTE — Progress Notes (Signed)
Noted. Agree with above.  Jilda Roche [place], DO 01/07/19 3:41 PM

## 2019-01-07 NOTE — Progress Notes (Signed)
Pt here for Blood pressure check-she walked in the office  Pt currently takes: losartan 25 mg   Pt reports compliance with medication.  BP today @ =136/95  HR =95  Pt advised per Dr. Carmelia Roller, take 2 tablets daily and follow up with edward in 2 weeks.   BP Readings from Last 3 Encounters:  01/06/19 (!) 143/90  01/05/19 122/85  11/30/18 134/76

## 2019-01-08 ENCOUNTER — Telehealth: Payer: Self-pay | Admitting: *Deleted

## 2019-01-08 ENCOUNTER — Ambulatory Visit (INDEPENDENT_AMBULATORY_CARE_PROVIDER_SITE_OTHER): Payer: Federal, State, Local not specified - PPO | Admitting: Medical

## 2019-01-08 ENCOUNTER — Telehealth: Payer: Self-pay | Admitting: Medical

## 2019-01-08 ENCOUNTER — Encounter: Payer: Self-pay | Admitting: Medical

## 2019-01-08 VITALS — BP 136/95 | HR 95

## 2019-01-08 DIAGNOSIS — K861 Other chronic pancreatitis: Secondary | ICD-10-CM

## 2019-01-08 DIAGNOSIS — E876 Hypokalemia: Secondary | ICD-10-CM | POA: Diagnosis not present

## 2019-01-08 DIAGNOSIS — K219 Gastro-esophageal reflux disease without esophagitis: Secondary | ICD-10-CM

## 2019-01-08 DIAGNOSIS — R748 Abnormal levels of other serum enzymes: Secondary | ICD-10-CM

## 2019-01-08 DIAGNOSIS — F419 Anxiety disorder, unspecified: Secondary | ICD-10-CM

## 2019-01-08 DIAGNOSIS — I10 Essential (primary) hypertension: Secondary | ICD-10-CM

## 2019-01-08 DIAGNOSIS — N390 Urinary tract infection, site not specified: Secondary | ICD-10-CM | POA: Diagnosis not present

## 2019-01-08 MED ORDER — CIPROFLOXACIN HCL 500 MG PO TABS: 500.0000 mg | ORAL_TABLET | Freq: Two times a day (BID) | ORAL | 0 refills | Status: DC

## 2019-01-08 MED ORDER — SUCRALFATE 1 G PO TABS: 1.0000 g | ORAL_TABLET | Freq: Three times a day (TID) | ORAL | 0 refills | Status: DC

## 2019-01-08 MED ORDER — BUSPIRONE HCL 7.5 MG PO TABS: 7.5000 mg | ORAL_TABLET | Freq: Two times a day (BID) | ORAL | 0 refills | Status: DC

## 2019-01-08 NOTE — Telephone Encounter (Signed)
LVM to call back to schedule 10 day f/u appointment from 01/08/19 visit

## 2019-01-08 NOTE — Telephone Encounter (Signed)
Post ED Visit - Positive Culture Follow-up  Culture report reviewed by antimicrobial stewardship pharmacist: [facility] []  Enzo Bi, Pharm.D. []  Celedonio Miyamoto, Pharm.D., BCPS AQ-ID []  Garvin Fila, Pharm.D., BCPS []  Georgina Pillion, Pharm.D., BCPS []  [place], [address].D., BCPS, AAHIVP []  Estella Husk, Pharm.D., BCPS, AAHIVP [x]  Lysle Pearl, PharmD, BCPS []  Phillips Climes, PharmD, BCPS []  Agapito Games, PharmD, BCPS []  Verlan Friends, PharmD []  Mervyn Gay, PharmD, BCPS []  Vinnie Level, PharmD  [facility] []  Len Childs, PharmD []  Greer Pickerel, PharmD []  Adalberto Cole, PharmD []  Perlie Gold, Rph []  Lonell Face) Jean Rosenthal, PharmD []  Earl Many, PharmD []  Junita Push, PharmD []  Dorna Leitz, PharmD []  Terrilee Files, PharmD []  Lynann Beaver, PharmD []  Keturah Barre, PharmD []  Loralee Pacas, PharmD []  Bernadene Person, PharmD   Positive urine culture Treated with Sulfamethoxazole-Trimethoprim, organism sensitive to the same and no further patient follow-up is required at this time.  Virl Axe [facility] 01/08/2019, 10:05 AM

## 2019-01-08 NOTE — Progress Notes (Signed)
Subjective:    Patient ID: Catherine Hubbard, female    DOB: 22-Jul-1961, 58 y.o.   MRN: [id number]  HPI   Virtual Visit via Video Note  I connected with Catherine Hubbard on 01/08/19 at 10:00 AM EDT by a video enabled telemedicine application and verified that I am speaking with the correct person using two identifiers.  Location: Patient: home Provider: office.   I discussed the limitations of evaluation and management by telemedicine and the availability of in person appointments. The patient expressed understanding and agreed to proceed.  History of Present Illness: I discussed the assessment and treatment plan with the patient. The patient was provided an opportunity to ask questions and all were answered. The patient agreed with the plan and demonstrated an understanding of the instructions.   The patient was advised to call back or seek an in-person evaluation if the symptoms worsen or if the condition fails to improve as anticipated.    Pt states she had 2 ED visits on consecutive days. 01-05-2019 and then on 01-06-2019.   01-07-19 ED notes area below  Catherine Hubbard is a 58 y.o. female with a history of multiple sclerosis and pancreatitis.  The pancreatitis first occurred several months ago and she has had mildly elevated as well as normal lipase levels since.  She does not know the cause of her pancreatitis.      4:06 AM Nominal pain significantly improved after IV medications and oral Carafate.  Patient complaining of itching but declines Benadryl as she states it would only "knock me out" and the itching helps distract her from the cramping in her legs.  4:31 AM Abdomen is soft, minimally tender in the right upper quadrant.  Patient's lipase and transaminases are mildly elevated.  The elevated lipase is consistent with previous episodes of pancreatitis.  That she got better with Carafate suggest she may have some gastric irritation; she is status  post gastric sleeve placement.  Her present complaint is cramping in her legs but states that she has a topical medication at home that works well and she was encouraged to use it.  We will refill her prescriptions for Zofran and tramadol and advised her to avoid alcohol.  She may be misrepresenting how much alcohol she drinks given her recurrent pancreatitis and elevated transaminases.  1. 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 Abnormal transaminases R74.8   3. Leg cramps R25.2     Overall on those 2 days dx with uti, edema and the above.  Bp is well controlled yesterday on check.  Pt states her stomach has been feeling better. She has nausea, vomiting before she went to ED. Today she is having a good day and able to eat/keep food down. In the past I was trying to get pt in with GI MD but issues scheduling due to pandemic.  Pt states 3 weeks ago she was drinking alcohol heavily for consecutive days but states not recently.   Observations/Objective: General- no acute distress. Pleasant. Oriented. Lungs- appears unlabored. Neuro- gross motor function appears intact.  Assessment and Plan: You to have uti recently. By culture and sensitivity review, I think cipro better option than bactrim ds. Stop bactrim ds and start cipro.  For pancreatitis, please stop alcohol use and avoid any greasy/fatty foods. Hydrate well and bland foods. Zofran rx'd by ED.    For gerd component rx carafate and use omeprazole.  Placed GI referral today  For htn continue current regimen.  For Anxiety  rx buspar. Hopefully if anxiety controlled will enable you not to drink alcohol.   If you progressively improve get labs done in 7 days.   Follow up in 10 days or sooner if needed  [employer], PA-C   25 minutes spent with pt. 50% of time spent counseling pt on diagnosis and plan going forward.  Follow Up Instructions:    I discussed the assessment and treatment plan with the patient. The patient was  provided an opportunity to ask questions and all were answered. The patient agreed with the plan and demonstrated an understanding of the instructions.   The patient was advised to call back or seek an in-person evaluation if the symptoms worsen or if the condition fails to improve as anticipated.     Catherine Richters, PA-C     Review of Systems  Constitutional: Negative for chills, fatigue and fever.  HENT: Negative for congestion.   Respiratory: Negative for cough, chest tightness, shortness of breath and wheezing.   Cardiovascular: Negative for chest pain and palpitations.  Gastrointestinal: Positive for nausea and vomiting. Negative for abdominal pain.       Good day today. No abd pain. No nausea and no vomiting.  Genitourinary: Positive for dysuria and urgency. Negative for difficulty urinating, frequency, vaginal discharge and vaginal pain.       Smell to urine.  Neurological: Negative for dizziness, tremors and headaches.  Hematological: Negative for adenopathy. Does not bruise/bleed easily.  Psychiatric/Behavioral: Negative for behavioral problems, confusion, hallucinations, sleep disturbance and suicidal ideas. The patient is nervous/anxious.        Pt thinks some anxiety contributing to her etoh use.       Objective:   Physical Exam        Assessment & Plan:

## 2019-01-08 NOTE — Patient Instructions (Addendum)
You to have uti recently. By culture and sensitivity review, I think cipro better option than bactrim ds. Stop bactrim ds and start cipro.  For pancreatitis, please stop alcohol use and avoid any greasy/fatty foods. Hydrate well and bland foods. Zofran rx'd by ED.    For gerd component rx carafate and use omeprazole.  Placed GI referral today  For htn continue current regimen.  For Anxiety rx buspar. Hopefully if anxiety controlled will enable you not to drink alcohol.   If you progressively improve get labs done in 7 days.   Follow up in 10 days or sooner if needed

## 2019-01-19 ENCOUNTER — Other Ambulatory Visit: Payer: Self-pay | Admitting: *Deleted

## 2019-01-19 DIAGNOSIS — G35 Multiple sclerosis: Secondary | ICD-10-CM

## 2019-01-19 MED ORDER — OCRELIZUMAB 300 MG/10ML IV SOLN: INTRAVENOUS | 1 refills | Status: DC

## 2019-01-21 ENCOUNTER — Other Ambulatory Visit: Payer: Self-pay | Admitting: Medical

## 2019-01-21 ENCOUNTER — Telehealth: Payer: Self-pay | Admitting: *Deleted

## 2019-01-21 NOTE — Telephone Encounter (Signed)
Called, LVM for pt to call office.  Pt last saw Dr. Epimenio Foot 10/28/2018. She has no pending f/u after seeing him. She needs f/u scheduled around 02/27/2019. Please schedule if she calls  She also needs to speak with Vickie Epley about scheduling MRI's ordered back on 10/28/18

## 2019-01-21 NOTE — Telephone Encounter (Signed)
Pt has returned the call to RN Kara Mead, she is scheduled for a f/u on 07-14.  Irving Burton was unavailable.  Message being sent asking that she calls pt back re: MRI

## 2019-01-21 NOTE — Telephone Encounter (Signed)
I eft a voicemail for patient to call back about scheduling mri.

## 2019-01-26 ENCOUNTER — Telehealth: Payer: Self-pay

## 2019-01-26 NOTE — Telephone Encounter (Signed)
Copied from [place] [id number]. Topic: Appointment Scheduling - Scheduling Inquiry for Clinic >> Jan 23, 2019  3:16 PM Margot Ables wrote: Reason for CRM: Pt called regarding no refills on Buspar. Pt was advised 5/21 OV to schedule 10 day f/u. Please call pt to schedule. No answer x3 calling office.

## 2019-01-26 NOTE — Telephone Encounter (Signed)
Left pt a message to call back to schedule follow up.

## 2019-01-29 ENCOUNTER — Emergency Department (HOSPITAL_COMMUNITY)
Admission: EM | Admit: 2019-01-29 | Discharge: 2019-01-29 | Disposition: A | Discharge status: Home / Self Care | Payer: Federal, State, Local not specified - PPO | Attending: Emergency Medicine | Admitting: Emergency Medicine

## 2019-01-29 ENCOUNTER — Emergency Department (HOSPITAL_COMMUNITY): Payer: Federal, State, Local not specified - PPO

## 2019-01-29 ENCOUNTER — Other Ambulatory Visit: Payer: Self-pay

## 2019-01-29 ENCOUNTER — Encounter (HOSPITAL_COMMUNITY): Payer: Self-pay | Admitting: Emergency Medicine

## 2019-01-29 DIAGNOSIS — W109XXA Fall (on) (from) unspecified stairs and steps, initial encounter: Secondary | ICD-10-CM | POA: Diagnosis not present

## 2019-01-29 DIAGNOSIS — Z79899 Other long term (current) drug therapy: Secondary | ICD-10-CM | POA: Diagnosis not present

## 2019-01-29 DIAGNOSIS — S0101XA Laceration without foreign body of scalp, initial encounter: Secondary | ICD-10-CM | POA: Diagnosis not present

## 2019-01-29 DIAGNOSIS — W19XXXA Unspecified fall, initial encounter: Secondary | ICD-10-CM

## 2019-01-29 DIAGNOSIS — S0285XA Fracture of orbit, unspecified, initial encounter for closed fracture: Secondary | ICD-10-CM

## 2019-01-29 DIAGNOSIS — Y999 Unspecified external cause status: Secondary | ICD-10-CM | POA: Diagnosis not present

## 2019-01-29 DIAGNOSIS — S0240EA Zygomatic fracture, right side, initial encounter for closed fracture: Secondary | ICD-10-CM

## 2019-01-29 DIAGNOSIS — Y929 Unspecified place or not applicable: Secondary | ICD-10-CM | POA: Insufficient documentation

## 2019-01-29 DIAGNOSIS — F10929 Alcohol use, unspecified with intoxication, unspecified: Secondary | ICD-10-CM

## 2019-01-29 DIAGNOSIS — R51 Headache: Secondary | ICD-10-CM | POA: Diagnosis not present

## 2019-01-29 DIAGNOSIS — S060X1A Concussion with loss of consciousness of 30 minutes or less, initial encounter: Secondary | ICD-10-CM

## 2019-01-29 DIAGNOSIS — S0280XA Fracture of other specified skull and facial bones, unspecified side, initial encounter for closed fracture: Secondary | ICD-10-CM

## 2019-01-29 DIAGNOSIS — Y939 Activity, unspecified: Secondary | ICD-10-CM | POA: Diagnosis not present

## 2019-01-29 DIAGNOSIS — S0990XA Unspecified injury of head, initial encounter: Secondary | ICD-10-CM | POA: Diagnosis not present

## 2019-01-29 DIAGNOSIS — S199XXA Unspecified injury of neck, initial encounter: Secondary | ICD-10-CM | POA: Diagnosis not present

## 2019-01-29 DIAGNOSIS — R404 Transient alteration of awareness: Secondary | ICD-10-CM | POA: Diagnosis not present

## 2019-01-29 DIAGNOSIS — S02841A Fracture of lateral orbital wall, right side, initial encounter for closed fracture: Secondary | ICD-10-CM | POA: Diagnosis not present

## 2019-01-29 DIAGNOSIS — G35 Multiple sclerosis: Secondary | ICD-10-CM | POA: Insufficient documentation

## 2019-01-29 DIAGNOSIS — I10 Essential (primary) hypertension: Secondary | ICD-10-CM | POA: Diagnosis not present

## 2019-01-29 DIAGNOSIS — R52 Pain, unspecified: Secondary | ICD-10-CM | POA: Diagnosis not present

## 2019-01-29 DIAGNOSIS — J45909 Unspecified asthma, uncomplicated: Secondary | ICD-10-CM | POA: Insufficient documentation

## 2019-01-29 DIAGNOSIS — R58 Hemorrhage, not elsewhere classified: Secondary | ICD-10-CM | POA: Diagnosis not present

## 2019-01-29 LAB — CBC WITH DIFFERENTIAL/PLATELET
Abs Immature Granulocytes: 0.03 10*3/uL (ref 0.00–0.07)
Basophils Absolute: 0.1 10*3/uL (ref 0.0–0.1)
Basophils Relative: 2 %
Eosinophils Absolute: 0.1 10*3/uL (ref 0.0–0.5)
Eosinophils Relative: 1 %
HCT: 38.6 % (ref 36.0–46.0)
Hemoglobin: 12.7 g/dL (ref 12.0–15.0)
Immature Granulocytes: 1 %
Lymphocytes Relative: 27 %
Lymphs Abs: 1.7 10*3/uL (ref 0.7–4.0)
MCH: 33.9 pg (ref 26.0–34.0)
MCHC: 32.9 g/dL (ref 30.0–36.0)
MCV: 102.9 fL — ABNORMAL HIGH (ref 80.0–100.0)
Monocytes Absolute: 0.5 10*3/uL (ref 0.1–1.0)
Monocytes Relative: 8 %
Neutro Abs: 3.9 10*3/uL (ref 1.7–7.7)
Neutrophils Relative %: 61 %
Platelets: 383 10*3/uL (ref 150–400)
RBC: 3.75 MIL/uL — ABNORMAL LOW (ref 3.87–5.11)
RDW: 14.1 % (ref 11.5–15.5)
WBC: 6.4 10*3/uL (ref 4.0–10.5)
nRBC: 0 % (ref 0.0–0.2)

## 2019-01-29 LAB — COMPREHENSIVE METABOLIC PANEL
ALT: 32 U/L (ref 0–44)
AST: 51 U/L — ABNORMAL HIGH (ref 15–41)
Albumin: 3.7 g/dL (ref 3.5–5.0)
Alkaline Phosphatase: 85 U/L (ref 38–126)
Anion gap: 13 (ref 5–15)
BUN: 18 mg/dL (ref 6–20)
CO2: 25 mmol/L (ref 22–32)
Calcium: 8.7 mg/dL — ABNORMAL LOW (ref 8.9–10.3)
Chloride: 103 mmol/L (ref 98–111)
Creatinine, Ser: 0.6 mg/dL (ref 0.44–1.00)
GFR calc Af Amer: >60 mL/min (ref >60)
GFR calc non Af Amer: >60 mL/min (ref >60)
Glucose, Bld: 104 mg/dL — ABNORMAL HIGH (ref 70–99)
Potassium: 4 mmol/L (ref 3.5–5.1)
Sodium: 141 mmol/L (ref 135–145)
Total Bilirubin: 0.4 mg/dL (ref 0.3–1.2)
Total Protein: 7 g/dL (ref 6.5–8.1)

## 2019-01-29 LAB — URINALYSIS, ROUTINE W REFLEX MICROSCOPIC
Bilirubin Urine: NEGATIVE
Glucose, UA: NEGATIVE mg/dL
Hgb urine dipstick: NEGATIVE
Ketones, ur: NEGATIVE mg/dL
Leukocytes,Ua: NEGATIVE
Nitrite: NEGATIVE
Protein, ur: NEGATIVE mg/dL
Specific Gravity, Urine: 1.011 (ref 1.005–1.030)
pH: 5 (ref 5.0–8.0)

## 2019-01-29 LAB — PROTIME-INR
INR: 1.1 (ref 0.8–1.2)
Prothrombin Time: 14.4 seconds (ref 11.4–15.2)

## 2019-01-29 LAB — AMMONIA: Ammonia: 12 umol/L (ref 9–35)

## 2019-01-29 LAB — CBG MONITORING, ED: Glucose-Capillary: 75 mg/dL (ref 70–99)

## 2019-01-29 LAB — ETHANOL: Alcohol, Ethyl (B): 281 mg/dL — ABNORMAL HIGH (ref <10)

## 2019-01-29 MED ORDER — OXYCODONE HCL 5 MG PO TABS
5.0000 mg | ORAL_TABLET | Freq: Once | ORAL | Status: AC
  Administered 2019-01-29: 5 mg via ORAL
  Filled 2019-01-29: qty 1

## 2019-01-29 MED ORDER — ONDANSETRON HCL 4 MG/2ML IJ SOLN
4.0000 mg | Freq: Once | INTRAMUSCULAR | Status: AC
  Administered 2019-01-29: 19:00:00 4 mg via INTRAVENOUS
  Filled 2019-01-29: qty 2

## 2019-01-29 MED ORDER — LIDOCAINE-EPINEPHRINE 2 %-1:100000 IJ SOLN
20.0000 mL | Freq: Once | INTRAMUSCULAR | Status: AC
  Administered 2019-01-29: 20 mL via INTRADERMAL
  Filled 2019-01-29: qty 20

## 2019-01-29 MED ORDER — OXYCODONE HCL 5 MG PO TABS: 2.5000 mg | ORAL_TABLET | Freq: Four times a day (QID) | ORAL | 0 refills | Status: DC | PRN

## 2019-01-29 NOTE — ED Notes (Signed)
Suture cart at bedside 

## 2019-01-29 NOTE — Discharge Instructions (Signed)
Do not consume alcohol for the next several weeks you have had a concussion in your brain needs time to rest.. Not drink alcohol with the pain medication I am prescribing.  This can cause very serious nervous system depression, can even lead to loss of ability or drive to breathe and death. Follow-up within the next week with Dr. Mancel Parsons for your facial fractures.  Please read all of the attached information regarding your diagnoses. Get help right away if: You have severe or worsening headaches. You have weakness or numbness in any part of your body. You are confused. Your coordination gets worse. You vomit repeatedly. You are sleepier than normal. You have convulsions. Your speech is slurred. You cannot recognize people or places. You have a seizure. It is difficult to wake you up. You have unusual behavior changes. You have changes in your vision. You lose consciousness.

## 2019-01-29 NOTE — ED Notes (Signed)
Pt refusing to keep bp cuff on at all times, pt keeps taking cuff and sp02 off. Will keep monitoring.

## 2019-01-29 NOTE — ED Notes (Signed)
Discharge instructions discussed with Pt and husband on phone. Pt and husband verbalized understanding. Pt stable and leaving via [place] with husband.

## 2019-01-29 NOTE — ED Provider Notes (Signed)
Care assumed at shift change from [place], [place], pending re-evaluation. See her note for full HPI and workup. Briefly, pt presenting after fall 2/t alcohol intoxication. Pt with right zygomatic and lateral orbit fracture. Also with questionable basilar skull fracture, however felt to be less likely per previous provider and neurosurgeon.  Neurosurgeon, Dr. Vertell Limber was consulted.  At time of handoff, pt still intoxicated, needs to be re-evaluated, likely concussion. Oriented to person, place and time. Anticipate d/c, recommend narcotic for pain.  Physical Exam  BP (!) 133/91   Pulse 96   Temp 97.9 F (36.6 C) (Oral)   Resp 14   LMP 08/13/2014 Comment: very few periods  SpO2 97%   Physical Exam Vitals signs and nursing note reviewed.  Constitutional:      General: She is not in acute distress.    Appearance: She is well-developed.  HENT:     Head: Normocephalic and atraumatic.  Eyes:     Conjunctiva/sclera: Conjunctivae normal.  Cardiovascular:     Rate and Rhythm: Normal rate.  Pulmonary:     Effort: Pulmonary effort is normal.  Abdominal:     Palpations: Abdomen is soft.  Skin:    General: Skin is warm.  Neurological:     Mental Status: She is alert.     Comments: Patient is alert and oriented.  Still is unable to recall the incident of the fall.  Cranial nerves grossly intact.  Fluent speech without aphasia.  No rhinorrhea.  Steady gait.  Psychiatric:        Behavior: Behavior normal.     ED Course/Procedures   Clinical Course as of Jan 29 2032  Thu Jan 29, 2019  1836 Evaluated patient.  Patient is alert and oriented.  She still is having amnesia to the events of her injury, however she feels as though she can ambulate.  Will p.o. challenge and ambulate with anticipated discharge with follow-up per previous provider.   [JR]  2033 Personally ambulated patient in the hallway to the restroom.  She did well.  She is ready for discharge.  Will discharge with instructions per  previous provider.   [JR]    Clinical Course User Index [JR] Daymien Goth, [place] N, PA-C    Procedures  MDM  Patient reevaluated once alert.  Ambulated by myself with steady gate to the restroom. Fluent speech, oriented. Pt still cannot recall events of the fall, likely secondary to concussion.  Discussed results with patient and instructions to follow-up.  Patient is agreeable to plan safe for discharge at this time.      Dariyah Garduno, [place] N, PA-C 01/29/19 2057    Little, Wenda Overland, MD 01/29/19 2128

## 2019-01-29 NOTE — ED Triage Notes (Addendum)
Patient in via [facility] from home after witnessed mechanical fall while stepping out of the house - per EMS, husband stated patient fell and hit the right side of her head. She also endorses bilateral shoulder pain and body pain that is chronic for her (history MS). Patient A&O x 2. Unknown LOC. Old hematomas to bilateral forearms. PERRL. Lac noted to R side of head, bleeding currently controlled.   EMS VS: 128/72, P 80, RR 16, CBG 140, 98% RA, 97.49F tympanic.  Upon undressing patient, found small airplane alcohol bottle in pocket. She states she was drinking earlier today.

## 2019-01-29 NOTE — ED Provider Notes (Signed)
[facility]   CSN: [id number] Arrival date & time: 01/29/19  1029     History   Chief Complaint Chief Complaint  Patient presents with  . Fall    HPI Catherine Hubbard is a 58 y.o. female.  With a past medical history of MS, pancreatitis who presents today with apparent mechanical fall and head laceration.   He patient is amnestic to the event.  There was a bottle of fireball found in her pocket.  She did admit to EMS to drinking several of these prior to her fall today.  According to EMS report the patient tripped coming down her stairs and hit the left side of her head.  Patient states that she does not remember any of this.  Patient is oriented to person place and time.  She is disoriented to the event states "I just woke up and here and there is all this blood on my head."  Is a level 5 caveat due to altered mental status.    HPI  Past Medical History:  Diagnosis Date  . Asthma   . Esophagitis   . GERD (gastroesophageal reflux disease)   . Headache   . Hearing loss   . Hypertension   . Multiple sclerosis (HCC)   . Pancreatitis   . Vision abnormalities     Patient Active Problem List   Diagnosis Date Noted  . Multiple sclerosis exacerbation (HCC) 11/16/2018  . Exacerbation of multiple sclerosis (HCC) 11/16/2018  . Pneumonia 09/26/2018  . Elevated troponin 09/26/2018  . Acute hypokalemia 09/25/2018  . Arthritis of carpometacarpal joint 03/27/2018  . Nocturnal leg cramps 03/26/2018  . Syncope due to orthostatic hypotension 02/12/2018  . Diarrhea 02/08/2018  . Acute lower UTI 02/08/2018  . Hematoma of scalp 02/08/2018  . Neck pain 01/20/2018  . Foreign body in esophagus   . Esophageal stricture   . Intractable nausea and vomiting 01/31/2017  . Dysphagia   . Severe protein-calorie malnutrition (HCC)   . Nausea & vomiting 01/30/2017  . Hypokalemia 01/30/2017  . Acute kidney injury (HCC) 01/30/2017  .  Cystitis 01/30/2017  . Costochondritis 05/11/2016  . Trochanteric bursitis of both hips 03/28/2016  . Upper back pain 03/28/2016  . Bilateral low back pain with bilateral sciatica 07/13/2015  . Right hip pain 07/13/2015  . Lumbar radicular pain 03/21/2015  . Other fatigue 01/11/2015  . Dysesthesia 01/11/2015  . Urinary urgency 01/11/2015  . Multiple sclerosis (HCC) 10/06/2014  . Numbness 10/06/2014  . Ataxic gait 10/06/2014  . High risk medication use 10/06/2014  . Gastric bypass status for obesity 10/06/2014  . Cognitive changes 10/06/2014  . Depression with anxiety 10/06/2014  . Restless leg 10/06/2014  . Insomnia 10/06/2014  . Essential hypertension 10/06/2014  . Difficulty hearing 01/13/2014  . Other muscle spasm 01/13/2014    Past Surgical History:  Procedure Laterality Date  . ANKLE FRACTURE SURGERY Bilateral   . CARPAL TUNNEL RELEASE Bilateral   . CHOLECYSTECTOMY    . COLONOSCOPY  11/2016   multiple  . ESOPHAGOGASTRODUODENOSCOPY  11/2016  . ESOPHAGOGASTRODUODENOSCOPY N/A 02/01/2017   Procedure: ESOPHAGOGASTRODUODENOSCOPY (EGD);  Surgeon: Iva BoopGessner, Carl E, MD;  [facility];  Service: Endoscopy;  Laterality: N/A;  . ESOPHAGOGASTRODUODENOSCOPY (EGD) WITH PROPOFOL N/A 02/04/2017   Procedure: ESOPHAGOGASTRODUODENOSCOPY (EGD) WITH PROPOFOL;  Surgeon: Meryl DareStark, Malcolm T, MD;  [facility];  Service: Endoscopy;  Laterality: N/A;  . LAPAROSCOPIC GASTRIC SLEEVE RESECTION  2011  . ULNAR NERVE REPAIR Bilateral  OB History   No obstetric history on file.      Home Medications    Prior to Admission medications   Medication Sig Start Date End Date Taking? Authorizing Provider  albuterol (PROVENTIL) (2.5 MG/3ML) 0.083% nebulizer solution Take 3 mLs (2.5 mg total) by nebulization every 6 (six) hours as needed for wheezing or shortness of breath. 10/31/18   Saguier, Percell Miller, PA-C  baclofen (LIORESAL) 10 MG tablet Take 1 tablet (10 mg total) by mouth 3 (three)  times daily. Patient taking differently: Take 10 mg by mouth at bedtime.  08/26/18   Sater, Nanine Means, MD  busPIRone (BUSPAR) 7.5 MG tablet Take 1 tablet (7.5 mg total) by mouth 2 (two) times daily. 01/08/19   Saguier, Percell Miller, PA-C  ciprofloxacin (CIPRO) 500 MG tablet Take 1 tablet (500 mg total) by mouth 2 (two) times daily. 01/08/19   Saguier, Percell Miller, PA-C  DULoxetine (CYMBALTA) 60 MG capsule Take 1 capsule (60 mg total) by mouth daily. 08/26/18   Sater, Nanine Means, MD  furosemide (LASIX) 20 MG tablet Take 1 tablet (20 mg total) by mouth daily. 01/05/19   Davonna Belling, MD  losartan (COZAAR) 25 MG tablet Take 1 tablet (25 mg total) by mouth 2 (two) times a day. 01/07/19   Shelda Pal, DO  modafinil (PROVIGIL) 200 MG tablet TAKE 1 TABLET BY MOUTH EVERY DAY 01/05/19   Sater, Nanine Means, MD  Multiple Vitamin (MULTIVITAMIN WITH MINERALS) TABS tablet Take 1 tablet by mouth daily.    [provider]  ocrelizumab (OCREVUS) 300 MG/10ML injection Inject a total of 600mg  IV every 6 months 01/19/19   Sater, Nanine Means, MD  omeprazole (PRILOSEC) 20 MG capsule Take 2 capsules (40 mg total) by mouth 2 (two) times daily. 11/13/18   Saguier, Percell Miller, PA-C  ondansetron (ZOFRAN-ODT) 8 MG disintegrating tablet TAKE 1 TABLET (8 MG TOTAL) BY MOUTH EVERY 8 (EIGHT) HOURS AS NEEDED FOR NAUSEA OR VOMITING. 01/22/19   Saguier, Percell Miller, PA-C  potassium chloride SA (K-DUR) 20 MEQ tablet Take 1 tablet (20 mEq total) by mouth 2 (two) times daily. 01/05/19   Davonna Belling, MD  sucralfate (CARAFATE) 1 g tablet Take 1 tablet (1 g total) by mouth 4 (four) times daily -  with meals and at bedtime. 01/08/19   Saguier, Percell Miller, PA-C  traMADol (ULTRAM) 50 MG tablet Take 1 tablet (50 mg total) by mouth every 6 (six) hours as needed (for pain). 01/06/19   Molpus, John, MD    Family History Family History  Problem Relation Age of Onset  . Cancer Mother   . Stroke Mother   . Cancer Father     Social History Social History    Tobacco Use  . Smoking status: Never Smoker  . Smokeless tobacco: Never Used  . Tobacco comment: second hand smoker when young. Heavy exposure.  Substance Use Topics  . Alcohol use: Not Currently    Alcohol/week: 0.0 standard drinks    Comment: Sts. she drinks beer, liquor daily, varying amts/fim  . Drug use: No     Allergies   Ibuprofen and Nsaids   Review of Systems Review of Systems Unable to review systems secondary to altered mentation  Physical Exam Updated Vital Signs BP 136/89 (BP Location: Right Arm)   Pulse 75   Temp 97.9 F (36.6 C) (Oral)   Resp 17   LMP 08/13/2014 Comment: very few periods  SpO2 98%   Physical Exam Vitals signs and nursing note reviewed.  Constitutional:  General: She is not in acute distress.    Appearance: She is well-developed. She is not diaphoretic.  HENT:     Head: Normocephalic.      Comments: Laceration to the R scalp Eyes:     General: No scleral icterus.    Extraocular Movements: Extraocular movements intact.     Conjunctiva/sclera: Conjunctivae normal.     Comments: Full range of motion of the bilateral eyes without severe pain or inability to move the eye.  Neck:     Musculoskeletal: Normal range of motion.  Cardiovascular:     Rate and Rhythm: Normal rate and regular rhythm.     Heart sounds: Normal heart sounds. No murmur. No friction rub. No gallop.   Pulmonary:     Effort: Pulmonary effort is normal. No respiratory distress.     Breath sounds: Normal breath sounds.  Abdominal:     General: Bowel sounds are normal. There is no distension.     Palpations: Abdomen is soft. There is no mass.     Tenderness: There is no abdominal tenderness. There is no guarding.  Musculoskeletal:     Comments: Full range of motion of the bilateral legs and upper extremities, normal grip strength bilaterally  Skin:    General: Skin is warm and dry.  Neurological:     Mental Status: She is alert and oriented to person, place,  and time.  Psychiatric:        Behavior: Behavior normal.      ED Treatments / Results  Labs (all labs ordered are listed, but only abnormal results are displayed) Labs Reviewed  CBC WITH DIFFERENTIAL/PLATELET  COMPREHENSIVE METABOLIC PANEL  URINALYSIS, ROUTINE W REFLEX MICROSCOPIC  AMMONIA  PROTIME-INR  ETHANOL  CBG MONITORING, ED    EKG    Radiology No results found.  Procedures .Marland KitchenLaceration Repair  Date/Time: 01/29/2019 4:38 PM Performed by: Arthor Captain, PA-C Authorized by: Arthor Captain, PA-C   Consent:    Consent obtained:  Verbal   Consent given by:  Patient   Risks discussed:  Infection, need for additional repair, pain, poor cosmetic result and poor wound healing   Alternatives discussed:  No treatment and delayed treatment Universal protocol:    Procedure explained and questions answered to patient or proxy's satisfaction: yes     Relevant documents present and verified: yes     Test results available and properly labeled: yes     Imaging studies available: yes     Required blood products, implants, devices, and special equipment available: yes     Site/side marked: yes     Immediately prior to procedure, a time out was called: yes     Patient identity confirmed:  Verbally with patient Anesthesia (see MAR for exact dosages):    Anesthesia method:  Local infiltration   Local anesthetic:  Lidocaine 2% WITH epi Laceration details:    Location:  Scalp   Scalp location:  R temporal   Length (cm):  4 Repair type:    Repair type:  Simple Pre-procedure details:    Preparation:  Patient was prepped and draped in usual sterile fashion Exploration:    Hemostasis achieved with:  Epinephrine   Wound exploration: wound explored through full range of motion   Treatment:    Area cleansed with:  Betadine   Amount of cleaning:  Standard   Irrigation solution:  Sterile saline Skin repair:    Repair method:  Staples   Number of staples:  3 Approximation:  Approximation:  Close Post-procedure details:    Dressing:  Open (no dressing)   (including critical care time)  Medications Ordered in ED Medications - No data to display   Initial Impression / Assessment and Plan / ED Course  I have reviewed the triage vital signs and the nursing notes.  Pertinent labs & imaging results that were available during my care of the patient were reviewed by me and considered in my medical decision making (see chart for details).        58 year old female with past medical history of MS, alcohol abuse.  Her husband and I discussed the fall today.  He states she has been drinking and she does drink frequently.  She did lose consciousness for approximately 3 minutes.  He states that she has a history of Anisa Correia and intermittent episodes of diplopia associated with disconjugate gaze that has been going on for several months.  He suspects that it is due to her MS and has been trying to follow-up with neurosurgery.  She does not have any of those symptoms here today.  The patient is still intoxicated.  I have repaired her scalp.  We have discussed the case with the patient's husband.  I have given signout to PA DeeringRobinson who will assume care.  Plan is to allow the patient to continue to sober up and then reassess her neurologic status.  I have also spoken with Dr. Kerri PerchesJody Stern about the patient's suspicion for potential basilar skull fracture.  She does not have any clear fluid coming from her nose or ears.  Is no hemotympanum.  Feel the patient does not have a basilar skull fracture.  Given the images he has low suspicion for this as well.  I reviewed the patient's labs which show microcytosis without anemia slightly elevated blood glucose normal ammonia, alcohol level of 281.  No other significant abnormalities.  Final Clinical Impressions(s) / ED Diagnoses   Final diagnoses:  None    ED Discharge Orders    None       Arthor CaptainHarris, Osby Sweetin, PA-C 01/29/19  1642    Cathren LaineSteinl, Kevin, MD 02/09/19 1526

## 2019-01-29 NOTE — ED Notes (Addendum)
Pt ambulated w/assistancex2 and taken to bathroom. Pt did c/o some light headedness, but was able to ambulate.

## 2019-02-01 ENCOUNTER — Other Ambulatory Visit: Payer: Self-pay | Admitting: Medical

## 2019-02-05 ENCOUNTER — Encounter: Payer: Self-pay | Admitting: Medical

## 2019-02-05 ENCOUNTER — Other Ambulatory Visit: Payer: Self-pay

## 2019-02-05 ENCOUNTER — Ambulatory Visit (INDEPENDENT_AMBULATORY_CARE_PROVIDER_SITE_OTHER): Payer: Federal, State, Local not specified - PPO | Admitting: Medical

## 2019-02-05 VITALS — BP 130/80 | HR 92 | Ht 65.0 in | Wt 156.2 lb

## 2019-02-05 DIAGNOSIS — R350 Frequency of micturition: Secondary | ICD-10-CM | POA: Diagnosis not present

## 2019-02-05 DIAGNOSIS — S0240EA Zygomatic fracture, right side, initial encounter for closed fracture: Secondary | ICD-10-CM

## 2019-02-05 LAB — POCT URINALYSIS DIPSTICK (MANUAL)
Nitrite, UA: POSITIVE — AB
Poct Bilirubin: NEGATIVE
Poct Blood: NEGATIVE
Poct Glucose: NORMAL mg/dL
Poct Urobilinogen: NORMAL mg/dL
Spec Grav, UA: 1.02 (ref 1.010–1.025)
pH, UA: 6 (ref 5.0–8.0)

## 2019-02-05 MED ORDER — HYDROCODONE-ACETAMINOPHEN 5-325 MG PO TABS: 1.0000 | ORAL_TABLET | Freq: Four times a day (QID) | ORAL | 0 refills | Status: DC | PRN

## 2019-02-05 MED ORDER — CIPROFLOXACIN HCL 250 MG PO TABS: 250.0000 mg | ORAL_TABLET | Freq: Two times a day (BID) | ORAL | 0 refills | Status: DC

## 2019-02-05 MED FILL — CIPROFLOXACIN HCL 250 MG TA: 250 | 3 days supply | Qty: 6 | Fill #0

## 2019-02-05 MED FILL — HYDROCODON-APAP 5-325: 5-325 | 4 days supply | Qty: 16 | Fill #0

## 2019-02-05 NOTE — Patient Instructions (Addendum)
You are diagnosed with alcohol intoxication emergency department, right zygomatic arch fracture, right scalp parietal region laceration and there was some debate of possible base of skull fracture though MD and ED thought that was not the case.  In addition looks like they diagnosed you with concussion.  Please stop drinking alcohol altogether.  With your MS and balance issues I am afraid that she may fall again.  Having a second head injury or concussion on top of concussion can lead to serious symptoms.   I am printing concussion education sheets today.  If you have any worsening or changing neurologic type signs symptoms then return to the emergency department.  For zygomatic arch fracture with pain on chewing, I did go ahead and place referral to the dentist that the emergency department recommended.  I removed staples right parietal region and the wound looks well-healed with no signs and symptoms of infection.  Thick scab is present and recommended to pat wash and dry the area over the next 7 days.  Recommend to use the final 2 tablets of the oxycodone am going to give you a limited number of Norco to use for pain in the zygomatic arch.  Remember alcohol and narcotics are not to be mixed.  Strong recommendation again not to drink any alcohol.  For frequent urination, will get urine POCT and urine culture.  This might be related to UTI but might be associated with MS.  Follow-up in 2 weeks or as needed.    Concussion, Adult A concussion is a brain injury from a direct hit (blow) to the head or body. This injury causes the brain to shake quickly back and forth inside the skull. It is caused by:  A hit to the head.  A quick and sudden movement (jolt) of the head or neck. How fast you will get better from a concussion depends on many things. Recovery can take time. It is important to wait to return to activity until a doctor says it is safe and your symptoms are all gone. Follow these  instructions at home: Activity  Limit activities that need a lot of thought or concentration. You may need to talk with your work Freight forwarder or teachers about this. Limit activities such as: ? Homework or work for your job. ? Watching TV. ? Computer work. ? Playing memory games and puzzles.  Rest. Rest helps the brain to heal. Make sure you: ? Get plenty of sleep at night. Do not stay up late. ? Rest during the day. Take naps or rest breaks when you feel tired.  Do not do activities that could cause a second concussion, such as riding a bike or playing sports. It can be dangerous if you get another concussion before the first one has healed.  Ask your doctor when you can return to your normal activities, like driving, riding a bike, or using machinery. Your ability to react may be slower. Do not do these activities if you are dizzy. Your doctor will likely give you a plan for slowly going back to activities. General instructions  Take over-the-counter and prescription medicines only as told by your doctor.  Do not drink alcohol until your doctor says you can.  Watch your symptoms and tell other people to do the same. Other problems (complications) can happen after a concussion. Older adults with a brain injury may have a higher risk of serious problems, such as a blood clot in the brain.  Tell your work Freight forwarder, teachers, school  nurse, school counselor, coach, or athletic trainer about your injury and symptoms. Tell them about what you can or cannot do. They should watch you for: ? More problems with attention or concentration. ? More trouble remembering or learning new information. ? More time needed to do tasks or assignments. ? Being more annoyed (irritable) or having a harder time dealing with stress. ? Any other symptoms that get worse.  Keep all follow-up visits as told by your doctor. This is important. Prevention  It is very important that you donot get another brain injury,  especially before you have healed. In rare cases, another injury can cause permanent brain damage, brain swelling, or death. You have the most risk if you get another head injury in the first 7-10 days after you were hurt before. To avoid injuries: ? Avoid activities that could make you get a second concussion, like contact sports. ? When you have returned to sports or activities:  Avoid plays or moves that can cause you to crash into another person. This is how most concussions happen.  Follow the rules and be respectful of other players. ? Get regular exercise that includes strength and balance training. ? Wear a helmet when you do activities like:  Biking.  Skiing.  Skateboarding.  Skating. ? Helmets can help protect you from serious skull and brain injuries, but they do not protect your from a concussion. Even when wearing a helmet, you should avoid being hit in the head. Contact a doctor if:  Your symptoms get worse or they do not get better.  You have new symptoms.  You have another injury. Get help right away if:  You have bad headaches or your headaches get worse.  You have weakness in any part of your body.  You are confused.  Your coordination gets worse.  You keep throwing up (vomiting).  You feel more sleepy than normal.  You twitch or shake violently (convulse) or have a seizure.  Your speech is not clear (is slurred).  You have strange behavior changes.  You have changes in how you see (vision).  You pass out (lose consciousness). Summary  A concussion is a brain injury from a direct hit (blow) to the head or body.  This condition is treated with rest and careful watching of symptoms.  If you keep having symptoms, call your doctor. This information is not intended to replace advice given to you by your health care provider. Make sure you discuss any questions you have with your health care provider. Document Released: 07/25/2009 Document Revised:  09/17/2017 Document Reviewed: 09/17/2017 Elsevier Interactive Patient Education  2019 [employer].

## 2019-02-05 NOTE — Progress Notes (Signed)
Subjective:    Patient ID: Catherine Hubbard, female    DOB: 10/28/1960, 58 y.o.   MRN: [id number]  HPI  Pt here for rt side scalp laceration/staple removal. Staple present for one week. Fall secondary to alcohol intoxication though pt denies this. Blood level 281. I advised this to patient and she still denies any etoh that day but states did drink night before. Pt fell and fx zygomatic arch and findings possible for occult basil skull fracture on ct. Also dx of concussion as she had loc briefly.   Pt coughing up some possible dark colored mucus. Maybe blood. She states can feel pnd. No chest congestion or fever.  Regarding potential skull fracture. ED got Dr. Kerri PerchesJody Stern to evaluate and  felt like pt did not have basilar skull fracture given the images low suspicioun  Some rt cheek area pain on chewing.  She has good neurologic exam in ED after sobering up.  She has hx of MS.  No lower ext edema today.  Pt has some frequent urination and she has concern for possible uti. She has ms and states at one point in past used ditropan but she stopped that in past. Over last week frequency occurred.   Pt given oxycodone for zygomatic arch fx. Only 2 tabs left.   Review of Systems  HENT: Negative for congestion, ear discharge, ear pain, nosebleeds, rhinorrhea, sinus pressure and sore throat.   Respiratory: Negative for cough, chest tightness and wheezing.   Cardiovascular: Negative for chest pain and palpitations.  Gastrointestinal: Positive for nausea.       2 times over past week but has hx of nausea easily. Both times occure after eating.  Genitourinary: Positive for frequency. Negative for decreased urine volume, difficulty urinating, menstrual problem and vaginal pain.  Musculoskeletal: Negative for back pain and joint swelling.       Rt xyzogmatic area pain.  Skin: Negative for rash.  Neurological: Negative for dizziness, speech difficulty, weakness, light-headedness and  headaches.  Psychiatric/Behavioral: Negative for behavioral problems, confusion, sleep disturbance and suicidal ideas. The patient is not nervous/anxious.     Past Medical History:  Diagnosis Date  . Asthma   . Esophagitis   . GERD (gastroesophageal reflux disease)   . Headache   . Hearing loss   . Hypertension   . Multiple sclerosis (HCC)   . Pancreatitis   . Vision abnormalities      Social History   Socioeconomic History  . Marital status: Married    Spouse name: Not on file  . Number of children: Not on file  . Years of education: Not on file  . Highest education level: Not on file  Occupational History  . Not on file  Social Needs  . Financial resource strain: Not on file  . Food insecurity    Worry: Not on file    Inability: Not on file  . Transportation needs    Medical: Not on file    Non-medical: Not on file  Tobacco Use  . Smoking status: Never Smoker  . Smokeless tobacco: Never Used  . Tobacco comment: second hand smoker when young. Heavy exposure.  Substance and Sexual Activity  . Alcohol use: Not Currently    Alcohol/week: 0.0 standard drinks    Comment: Sts. she drinks beer, liquor daily, varying amts/fim  . Drug use: No  . Sexual activity: Never  Lifestyle  . Physical activity    Days per week: Not on file  Minutes per session: Not on file  . Stress: Not on file  Relationships  . Social Musician on phone: Not on file    Gets together: Not on file    Attends religious service: Not on file    Active member of club or organization: Not on file    Attends meetings of clubs or organizations: Not on file    Relationship status: Not on file  . Intimate partner violence    Fear of current or ex partner: Not on file    Emotionally abused: Not on file    Physically abused: Not on file    Forced sexual activity: Not on file  Other Topics Concern  . Not on file  Social History Narrative  . Not on file    Past Surgical History:   Procedure Laterality Date  . ANKLE FRACTURE SURGERY Bilateral   . CARPAL TUNNEL RELEASE Bilateral   . CHOLECYSTECTOMY    . COLONOSCOPY  11/2016   multiple  . ESOPHAGOGASTRODUODENOSCOPY  11/2016  . ESOPHAGOGASTRODUODENOSCOPY N/A 02/01/2017   Procedure: ESOPHAGOGASTRODUODENOSCOPY (EGD);  Surgeon: Iva Boop, MD;  Location: [facility] ENDOSCOPY;  Service: Endoscopy;  Laterality: N/A;  . ESOPHAGOGASTRODUODENOSCOPY (EGD) WITH PROPOFOL N/A 02/04/2017   Procedure: ESOPHAGOGASTRODUODENOSCOPY (EGD) WITH PROPOFOL;  Surgeon: Meryl Dare, MD;  Location[facility] END[facility]COPY;  Service: Endoscopy;  Laterality: N/A;  . LAPAROSCOPIC GASTRIC SLEEVE RESECTION  2011  . ULNAR NERVE REPAIR Bilateral     Family History  Problem Relation Age of Onset  . Cancer Mother   . Stroke Mother   . Cancer Father     Allergies  Allergen Reactions  . 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 Nsaids     Gastric bypass    Current Outpatient Medications on File Prior to Visit  Medication Sig Dispense Refill  . albuterol (PROVENTIL) (2.5 MG/3ML) 0.083% nebulizer solution Take 3 mLs (2.5 mg total) by nebulization every 6 (six) hours as needed for wheezing or shortness of breath. (Patient not taking: Reported on 01/29/2019) 150 mL 1  . baclofen (LIORESAL) 10 MG tablet Take 1 tablet (10 mg total) by mouth 3 (three) times daily. 90 each 05  . busPIRone (BUSPAR) 7.5 MG tablet TAKE 1 TABLET (7.5 MG TOTAL) BY MOUTH 2 (TWO) TIMES DAILY. 60 tablet 0  . ciprofloxacin (CIPRO) 500 MG tablet Take 1 tablet (500 mg total) by mouth 2 (two) times daily. (Patient taking differently: Take 250 mg by mouth 2 (two) times daily. ) 14 tablet 0  . DULoxetine (CYMBALTA) 60 MG capsule Take 1 capsule (60 mg total) by mouth daily. 30 capsule 11  . furosemide (LASIX) 20 MG tablet Take 1 tablet (20 mg total) by mouth daily. (Patient not taking: Reported on 01/29/2019) 5 tablet 0  . hydrochlorothiazide (HYDRODIURIL) 25 MG tablet  Take 25 mg by mouth daily.    Marland Kitchen losartan (COZAAR) 25 MG tablet Take 1 tablet (25 mg total) by mouth 2 (two) times a day. (Patient taking differently: Take 25 mg by mouth daily. ) 60 tablet 0  . modafinil (PROVIGIL) 200 MG tablet TAKE 1 TABLET BY MOUTH EVERY DAY (Patient taking differently: Take 200 mg by mouth daily. ) 30 tablet 5  . ocrelizumab (OCREVUS) 300 MG/10ML injection Inject a total of 600mg  IV every 6 months (Patient not taking: Reported on 01/29/2019) 20 mL 1  . omeprazole (PRILOSEC) 20 MG capsule Take 2 capsules (40 mg total) by  mouth 2 (two) times daily. 60 capsule 2  . ondansetron (ZOFRAN-ODT) 8 MG disintegrating tablet TAKE 1 TABLET (8 MG TOTAL) BY MOUTH EVERY 8 (EIGHT) HOURS AS NEEDED FOR NAUSEA OR VOMITING. (Patient not taking: Reported on 01/29/2019) 17 tablet 1  . oxyCODONE (ROXICODONE) 5 MG immediate release tablet Take 0.5-1 tablets (2.5-5 mg total) by mouth every 6 (six) hours as needed for severe pain. 10 tablet 0  . potassium chloride SA (K-DUR) 20 MEQ tablet Take 1 tablet (20 mEq total) by mouth 2 (two) times daily. (Patient not taking: Reported on 01/29/2019) 10 tablet 0  . sucralfate (CARAFATE) 1 g tablet TAKE 1 TABLET (1 G TOTAL) BY MOUTH 4 (FOUR) TIMES DAILY - WITH MEALS AND AT BEDTIME. 120 tablet 0  . traMADol (ULTRAM) 50 MG tablet Take 1 tablet (50 mg total) by mouth every 6 (six) hours as needed (for pain). 16 tablet 0   No current facility-administered medications on file prior to visit.     BP 130/80   Pulse 92   Ht 5\' 5"  (1.651 m)   Wt 156 lb 3.2 oz (70.9 kg)   LMP 08/13/2014 Comment: very few periods  SpO2 98%   BMI 25.99 kg/m       Objective:   Physical Exam  General Mental Status- Alert. General Appearance- Not in acute distress.   Skin General: Color- Normal Color. Moisture- Normal Moisture.  Neck Carotid Arteries- Normal color. Moisture- Normal Moisture. No carotid bruits. No JVD.  Chest and Lung Exam Auscultation: Breath Sounds:-Normal.   Cardiovascular Auscultation:Rythm- Regular. Murmurs & Other Heart Sounds:Auscultation of the heart reveals- No Murmurs.  Abdomen Inspection:-Inspeection Normal. Palpation/Percussion:Note:No mass. Palpation and Percussion of the abdomen reveal- Non Tender, Non Distended + BS, no rebound or guarding.    Neurologic Cranial Nerve exam:- CN III-XII intact(No nystagmus), symmetric smile. Finger to nose intact. Symmetric smile. Strength:- 5/5 equal and symmetric strength both upper and lower extremities.  Rt side scalp parietal area- laceration with 3 stable. Clean and looks healed.      Assessment & Plan:  Please stop drinking alcohol altogether.  With your MS and balance issues I am afraid that she may fall again.  Having a second head injury or concussion on top of concussion can lead to serious symptoms.   I am printing concussion education sheets today.  If you have any worsening or changing neurologic type signs symptoms then return to the emergency department.  For zygomatic arch fracture with pain on chewing, I did go ahead and place referral to the dentist that the emergency department recommended.  I removed staples right parietal region and the wound looks well-healed with no signs and symptoms of infection.  Thick scab is present and recommended to pat wash and dry the area over the next 7 days.  Recommend to use the final 2 tablets of the oxycodone am going to give you a limited number of Norco to use for pain in the zygomatic arch.  Remember alcohol and narcotics are not to be mixed.  Strong recommendation again not to drink any alcohol.  For frequent urination, will get urine POCT and urine culture.  This might be related to UTI but might be associated with MS.  Follow-up in 2 weeks or as needed.  Canceled the [facility]co [facility]. Called and they took our rx. Sent to [facility].   40 minutes spent with pt. 50% of time spent counseling patient on plan going  forward.  Mackie Pai, PA-C

## 2019-02-08 LAB — URINE CULTURE
MICRO NUMBER:: 584006
SPECIMEN QUALITY:: ADEQUATE

## 2019-02-18 DIAGNOSIS — G35 Multiple sclerosis: Secondary | ICD-10-CM | POA: Diagnosis not present

## 2019-02-19 ENCOUNTER — Other Ambulatory Visit: Payer: Self-pay | Admitting: Medical

## 2019-03-03 ENCOUNTER — Ambulatory Visit: Payer: Federal, State, Local not specified - PPO | Admitting: Neurology

## 2019-03-03 DIAGNOSIS — M9903 Segmental and somatic dysfunction of lumbar region: Secondary | ICD-10-CM | POA: Diagnosis not present

## 2019-03-03 DIAGNOSIS — M791 Myalgia, unspecified site: Secondary | ICD-10-CM | POA: Diagnosis not present

## 2019-03-03 DIAGNOSIS — M9901 Segmental and somatic dysfunction of cervical region: Secondary | ICD-10-CM | POA: Diagnosis not present

## 2019-03-03 DIAGNOSIS — M9902 Segmental and somatic dysfunction of thoracic region: Secondary | ICD-10-CM | POA: Diagnosis not present

## 2019-03-07 ENCOUNTER — Emergency Department (HOSPITAL_BASED_OUTPATIENT_CLINIC_OR_DEPARTMENT_OTHER)
Admission: EM | Admit: 2019-03-07 | Discharge: 2019-03-07 | Disposition: A | Discharge status: Home / Self Care | Payer: Federal, State, Local not specified - PPO | Attending: Emergency Medicine | Admitting: Emergency Medicine

## 2019-03-07 ENCOUNTER — Emergency Department (HOSPITAL_BASED_OUTPATIENT_CLINIC_OR_DEPARTMENT_OTHER): Payer: Federal, State, Local not specified - PPO

## 2019-03-07 ENCOUNTER — Other Ambulatory Visit: Payer: Self-pay

## 2019-03-07 ENCOUNTER — Telehealth: Payer: Self-pay | Admitting: Medical

## 2019-03-07 ENCOUNTER — Encounter (HOSPITAL_BASED_OUTPATIENT_CLINIC_OR_DEPARTMENT_OTHER): Payer: Self-pay | Admitting: Emergency Medicine

## 2019-03-07 DIAGNOSIS — W19XXXA Unspecified fall, initial encounter: Secondary | ICD-10-CM | POA: Insufficient documentation

## 2019-03-07 DIAGNOSIS — S42211A Unspecified displaced fracture of surgical neck of right humerus, initial encounter for closed fracture: Secondary | ICD-10-CM

## 2019-03-07 DIAGNOSIS — Y939 Activity, unspecified: Secondary | ICD-10-CM | POA: Insufficient documentation

## 2019-03-07 DIAGNOSIS — Y92009 Unspecified place in unspecified non-institutional (private) residence as the place of occurrence of the external cause: Secondary | ICD-10-CM

## 2019-03-07 DIAGNOSIS — S4991XA Unspecified injury of right shoulder and upper arm, initial encounter: Secondary | ICD-10-CM | POA: Diagnosis not present

## 2019-03-07 DIAGNOSIS — Y929 Unspecified place or not applicable: Secondary | ICD-10-CM | POA: Diagnosis not present

## 2019-03-07 DIAGNOSIS — Y999 Unspecified external cause status: Secondary | ICD-10-CM | POA: Diagnosis not present

## 2019-03-07 DIAGNOSIS — G35 Multiple sclerosis: Secondary | ICD-10-CM | POA: Diagnosis not present

## 2019-03-07 DIAGNOSIS — I10 Essential (primary) hypertension: Secondary | ICD-10-CM | POA: Diagnosis not present

## 2019-03-07 DIAGNOSIS — S2241XA Multiple fractures of ribs, right side, initial encounter for closed fracture: Secondary | ICD-10-CM | POA: Diagnosis not present

## 2019-03-07 HISTORY — DX: Alcohol abuse, uncomplicated: F10.10

## 2019-03-07 MED ORDER — HYDROCODONE-ACETAMINOPHEN 5-325 MG PO TABS
1.0000 | ORAL_TABLET | Freq: Once | ORAL | Status: AC
  Administered 2019-03-07: 1 via ORAL

## 2019-03-07 MED ORDER — HYDROCODONE-ACETAMINOPHEN 5-325 MG PO TABS
1.0000 | ORAL_TABLET | Freq: Once | ORAL | Status: AC
  Administered 2019-03-07: 1 via ORAL
  Filled 2019-03-07: qty 1

## 2019-03-07 MED ORDER — OXYCODONE HCL 5 MG PO CAPS: 5.0000 mg | ORAL_CAPSULE | ORAL | 0 refills | Status: DC | PRN

## 2019-03-07 MED ORDER — HYDROCODONE-ACETAMINOPHEN 5-325 MG PO TABS
ORAL_TABLET | ORAL | Status: AC
  Administered 2019-03-07: 1 via ORAL
  Filled 2019-03-07: qty 1

## 2019-03-07 NOTE — ED Notes (Signed)
Patient transported to CT 

## 2019-03-07 NOTE — ED Triage Notes (Signed)
Pt fell tonight. Tonight c/o right shoulder pain. Pt has hx of MS.

## 2019-03-07 NOTE — Telephone Encounter (Signed)
Pt fell and has fracture. Seen in ED. Would you call pt on Monday and see how she is. Would you make sure pt aware of details of referral to ortho. If pt not aware or that is not set up then schedule her virtual follow up with me. As I might need to put in referral?

## 2019-03-07 NOTE — ED Notes (Signed)
Pt requested Ice pack. RN gave patient one

## 2019-03-07 NOTE — ED Provider Notes (Addendum)
[place] [facility] Provider Note: Georgena Spurling, MD, FACEP  CSN: [id number] MRN: [id number] ARRIVAL: 03/07/19 at [place]: [place]  Shoulder Injury   HISTORY OF PRESENT ILLNESS  03/07/19 2:03 AM Catherine Hubbard is a 58 y.o. female with multiple sclerosis and alcohol abuse.  She fell just prior to arrival and is complaining of severe pain in her right upper humerus.  She rates her pain as a 10 out of 10.  Pain is worse with palpation or attempted movement.  Movement of the right shoulder is limited.  There is no known functional deficit or numbness distal to the injury.  She denies other injury.  She admits to drinking earlier.   Past Medical History:  Diagnosis Date   Alcohol abuse    Asthma    Esophagitis    GERD (gastroesophageal reflux disease)    Headache    Hearing loss    Hypertension    Multiple sclerosis ([place])    Pancreatitis    Vision abnormalities     Past Surgical History:  Procedure Laterality Date   ANKLE FRACTURE SURGERY Bilateral    CARPAL TUNNEL RELEASE Bilateral    CHOLECYSTECTOMY     COLONOSCOPY  11/2016   multiple   ESOPHAGOGASTRODUODENOSCOPY  11/2016   ESOPHAGOGASTRODUODENOSCOPY N/A 02/01/2017   Procedure: ESOPHAGOGASTRODUODENOSCOPY (EGD);  Surgeon: Gatha Mayer, MD;  Location: [facility] ENDOSCOPY;  Service: Endoscopy;  Laterality: N/A;   ESOPHAGOGASTRODUODENOSCOPY (EGD) WITH PROPOFOL N/A 02/04/2017   Procedure: ESOPHAGOGASTRODUODENOSCOPY (EGD) WITH PROPOFOL;  Surgeon: Ladene Artist, MD;  Location: [facility] ENDOSCOPY;  Service: Endoscopy;  Laterality: N/A;   LAPAROSCOPIC GASTRIC SLEEVE RESECTION  2011   ULNAR NERVE REPAIR Bilateral     Family History  Problem Relation Age of Onset   Cancer Mother    Stroke Mother    Cancer Father     Social History   Tobacco Use   Smoking status: Never Smoker   Smokeless tobacco: Never Used   Tobacco comment: second hand smoker when young. Heavy  exposure.  Substance Use Topics   Alcohol use: Yes    Comment: Sts. she drinks beer, liquor daily, varying amts/fim   Drug use: No    Prior to Admission medications   Medication Sig Start Date End Date Taking? Authorizing Provider  albuterol (PROVENTIL) (2.5 MG/3ML) 0.083% nebulizer solution Take 3 mLs (2.5 mg total) by nebulization every 6 (six) hours as needed for wheezing or shortness of breath. Patient not taking: Reported on 01/29/2019 10/31/18   Saguier, Percell Miller, PA-C  baclofen (LIORESAL) 10 MG tablet Take 1 tablet (10 mg total) by mouth 3 (three) times daily. 08/26/18   Sater, Nanine Means, MD  busPIRone (BUSPAR) 7.5 MG tablet TAKE 1 TABLET (7.5 MG TOTAL) BY MOUTH 2 (TWO) TIMES DAILY. 02/02/19   Saguier, Percell Miller, PA-C  ciprofloxacin (CIPRO) 250 MG tablet Take 1 tablet (250 mg total) by mouth 2 (two) times daily. 02/05/19   Saguier, Percell Miller, PA-C  DULoxetine (CYMBALTA) 60 MG capsule Take 1 capsule (60 mg total) by mouth daily. 08/26/18   Sater, Nanine Means, MD  furosemide (LASIX) 20 MG tablet Take 1 tablet (20 mg total) by mouth daily. Patient not taking: Reported on 01/29/2019 01/05/19   Davonna Belling, MD  hydrochlorothiazide (HYDRODIURIL) 25 MG tablet Take 25 mg by mouth daily.    [provider]  HYDROcodone-acetaminophen (NORCO) 5-325 MG tablet Take 1 tablet by mouth every 6 (six) hours as needed for moderate pain. 02/05/19   Saguier,  Edward, PA-C  losartan (COZAAR) 25 MG tablet TAKE 1 TABLET BY MOUTH EVERY DAY 02/19/19   Saguier, Ramon DredgeEdward, PA-C  modafinil (PROVIGIL) 200 MG tablet TAKE 1 TABLET BY MOUTH EVERY DAY Patient taking differently: Take 200 mg by mouth daily.  01/05/19   Sater, Pearletha Furlichard A, MD  ocrelizumab (OCREVUS) 300 MG/10ML injection Inject a total of 600mg  IV every 6 months Patient not taking: Reported on 01/29/2019 01/19/19   Sater, Pearletha Furlichard A, MD  omeprazole (PRILOSEC) 20 MG capsule Take 2 capsules (40 mg total) by mouth 2 (two) times daily. 11/13/18   Saguier, Ramon DredgeEdward, PA-C    ondansetron (ZOFRAN-ODT) 8 MG disintegrating tablet TAKE 1 TABLET (8 MG TOTAL) BY MOUTH EVERY 8 (EIGHT) HOURS AS NEEDED FOR NAUSEA OR VOMITING. Patient not taking: Reported on 01/29/2019 01/22/19   Saguier, Ramon DredgeEdward, PA-C  potassium chloride SA (K-DUR) 20 MEQ tablet Take 1 tablet (20 mEq total) by mouth 2 (two) times daily. Patient not taking: Reported on 01/29/2019 01/05/19   Benjiman CorePickering, Nathan, MD  sucralfate (CARAFATE) 1 g tablet TAKE 1 TABLET (1 G TOTAL) BY MOUTH 4 (FOUR) TIMES DAILY - WITH MEALS AND AT BEDTIME. 02/02/19   Saguier, Ramon DredgeEdward, PA-C    Allergies Ibuprofen and Nsaids   REVIEW OF SYSTEMS  Negative except as noted here or in the History of Present Illness.   PHYSICAL EXAMINATION  Initial Vital Signs Blood pressure 125/86, pulse 83, temperature 97.6 F (36.4 C), temperature source Oral, resp. rate 16, height 5\' 5"  (1.651 m), weight 66.7 kg, last menstrual period 08/13/2014, SpO2 98 %.  Examination General: Well-developed, thin female in no acute distress; appears older than age of record HENT: normocephalic; atraumatic Eyes: pupils equal, round and reactive to light; extraocular muscles intact Neck: supple; nontender Heart: regular rate and rhythm Lungs: clear to auscultation bilaterally Chest: Nontender Abdomen: soft; nondistended; nontender; bowel sounds present Extremities: No deformity; tenderness right upper humerus, right shoulder held in abduction, right upper extremity distally neurovascularly intact Neurologic: Awake, alert and oriented; motor function intact in all extremities and symmetric; no facial droop; hard of hearing Skin: Warm and dry; multiple ecchymoses of varying ages on upper extremities Psychiatric: Normal mood and affect   RESULTS  Summary of this visit's results, reviewed by myself:   EKG Interpretation  Date/Time:    Ventricular Rate:    PR Interval:    QRS Duration:   QT Interval:    QTC Calculation:   R Axis:     Text Interpretation:         Laboratory Studies: No results found for this or any previous visit (from the past 24 hour(s)). Imaging Studies: Ct Chest Wo Contrast  Result Date: 03/07/2019 CLINICAL DATA:  Post fall with right arm pain. Questionable acute rib fractures on humerus radiograph. EXAM: CT CHEST WITHOUT CONTRAST TECHNIQUE: Multidetector CT imaging of the chest was performed following the standard protocol without IV contrast. COMPARISON:  Right humerus radiograph earlier this day. Chest x-ray 01/05/2019. Chest CT 11/30/2018 FINDINGS: Cardiovascular: Mild aortic atherosclerosis. No periaortic stranding. Descending aortic tortuosity. Heart is normal in size. No pericardial effusion. Mediastinum/Nodes: No mediastinal hemorrhage. No pneumomediastinum. Subcentimeter mediastinal lymph nodes. Mild distal esophageal wall thickening, small to moderate hiatal hernia. Chain sutures at the gastroesophageal junction. Small thyroid nodule on prior exam not as well visualized currently. Lungs/Pleura: No pneumothorax or pulmonary contusion. No focal airspace disease. Trachea and mainstem bronchi are patent. Mild chronic volume loss in the medial right middle lobe. Mild pleural fluid. Upper Abdomen: Postcholecystectomy. Chain  sutures about the greater curvature of the stomach. No acute findings. Musculoskeletal: Comminuted impacted fracture of the proximal humerus with mild displacement. No evidence of glenoid fracture is questioned on radiograph. Nondisplaced lateral right second and third rib fractures are acute. Additional fracture of the anterior right third rib as well as anterior fourth and fifth ribs are age indeterminate, but new from a April 2020 CT. Multiple additional bilateral rib fractures are chronic. Chronic manubrial fracture with increased displacement compared to prior exam. Chronic L1 superior endplate compression fracture, unchanged. Minor T12 superior endplate compression fracture is age indeterminate, but new from  prior CT. IMPRESSION: 1. Comminuted impacted proximal right humerus fracture. 2. Nondisplaced lateral right lateral second and third rib fractures are acute. Age indeterminate fractures of anterior third through fifth right ribs, however new from April 2020 CT. No pneumothorax or pulmonary complication. 3. Age indeterminate minor T12 superior endplate compression fracture, also new from April 2020 CT. Chronic L1 compression fracture. Electronically Signed   By: Narda Rutherford M.D.   On: 03/07/2019 03:24   Dg Humerus Right  Result Date: 03/07/2019 CLINICAL DATA:  Fall today with right arm injury. EXAM: RIGHT HUMERUS - 2+ VIEW COMPARISON:  Right shoulder radiograph 11/30/2018, humerus radiograph 12/14/2015 FINDINGS: Comminuted mildly displaced fracture through the surgical neck of the humerus. Fracture displacement of less than 1 cm. Small osseous fragments adjacent to the inferior glenoid, donor site uncertain. Distal humerus is intact. Remote right rib fractures, however fracture of upper right ribs may be acute. IMPRESSION: 1. Comminuted mildly displaced fracture through the surgical neck of the humerus. 2. Small osseous densities adjacent to the inferior glenoid, donor site uncertain, however inferior glenoid fracture is suspected. 3. Remote right rib fractures, however few upper right rib fractures may be acute tumor not definitively seen on prior. Recommend correlation with focal tenderness, consider dedicated rib exam. Electronically Signed   By: Narda Rutherford M.D.   On: 03/07/2019 02:28    ED COURSE and MDM  Nursing notes and initial vitals signs, including pulse oximetry, reviewed.  Vitals:   03/07/19 0157 03/07/19 0201  BP:  125/86  Pulse:  83  Resp:  16  Temp:  97.6 F (36.4 C)  TempSrc:  Oral  SpO2:  98%  Weight: 66.7 kg   Height: 5\' 5"  (1.651 m)    Radiographs show comminuted right humeral neck fracture with acute fractures of right second and third ribs.  The patient is not  complaining of back pain and is not tender at T12 so this is likely subacute.  Will place in a shoulder immobilizer and refer to orthopedics.  Patient advised not to drink alcohol when taking narcotic pain medication.  PROCEDURES    ED DIAGNOSES     ICD-10-CM   1. Fall in home, initial encounter  W19.XXXA    Y92.009   2. Closed fracture of neck of right humerus, initial encounter  S42.211A   3. Fracture of ribs, two, closed, right, initial encounter  S22.41XA        Dewan Emond, MD 03/07/19 0334    Paula Libra, MD 03/07/19 [phone]

## 2019-03-07 NOTE — ED Triage Notes (Signed)
Pt estimates drinking 6 beers and 1 shot of liquor tonight.

## 2019-03-09 ENCOUNTER — Telehealth: Payer: Self-pay | Admitting: Orthopaedic Surgery

## 2019-03-09 NOTE — Telephone Encounter (Signed)
No earlier appts at this time. Spoke with Artis Delay, ok for appt on 7/22 if nothing sooner becomes available.

## 2019-03-09 NOTE — Telephone Encounter (Signed)
Patient's husband called left voicemail message asking if patient can be put on a wait list for an earlier appointment. The number to contact patient is [phone]

## 2019-03-09 NOTE — Telephone Encounter (Signed)
Ok Thanks, °

## 2019-03-09 NOTE — Telephone Encounter (Signed)
Pt has appointment with ortho 03/11/19

## 2019-03-10 NOTE — Telephone Encounter (Signed)
No earlier appt times. Patient has appt scheduled for 03/11/2019. Will leave appt there.

## 2019-03-11 ENCOUNTER — Encounter: Payer: Self-pay | Admitting: Orthopaedic Surgery

## 2019-03-11 ENCOUNTER — Other Ambulatory Visit: Payer: Self-pay

## 2019-03-11 ENCOUNTER — Ambulatory Visit (INDEPENDENT_AMBULATORY_CARE_PROVIDER_SITE_OTHER): Payer: Federal, State, Local not specified - PPO | Admitting: Orthopaedic Surgery

## 2019-03-11 DIAGNOSIS — S42291A Other displaced fracture of upper end of right humerus, initial encounter for closed fracture: Secondary | ICD-10-CM | POA: Diagnosis not present

## 2019-03-11 MED ORDER — TIZANIDINE HCL 4 MG PO TABS: 4.0000 mg | ORAL_TABLET | Freq: Three times a day (TID) | ORAL | 0 refills | Status: DC | PRN

## 2019-03-11 MED ORDER — OXYCODONE HCL 5 MG PO TABS: 5.0000 mg | ORAL_TABLET | ORAL | 0 refills | Status: DC | PRN

## 2019-03-11 MED ORDER — IBUPROFEN 800 MG PO TABS: 800.0000 mg | ORAL_TABLET | Freq: Three times a day (TID) | ORAL | 0 refills | Status: DC | PRN

## 2019-03-11 NOTE — Progress Notes (Signed)
Office Visit Note   Patient: Catherine Hubbard           Date of Birth: 12/01/1960           MRN: [id number] Visit Date: 03/11/2019              Requested by: [name], PA-C [address] [place],  [place] [address] PCP: [name], PA-C   Assessment & Plan: Visit Diagnoses:  1. Closed 3-part fracture of proximal end of right humerus, initial encounter     Plan: Fortunately this is not an injury that we are recommending surgery on.  He will be a sling for comfort.  I will send in some more oxycodone and have counseled her about the use of this medication and reviewed her medications.  I am also going to try some Zanaflex and 800 mg ibuprofen.  Even though she is had gastric bypass surgery I feel that this would be appropriate for the short-term and I explained this to her as well.  We will see her back in 3 weeks.  At that visit I would like just an AP of the right shoulder.  Follow-Up Instructions: Return in about 3 weeks (around 04/01/2019).   Orders:  No orders of the defined types were placed in this encounter.  Meds ordered this encounter  Medications  . oxyCODONE (ROXICODONE) 5 MG immediate release tablet    Sig: Take 1 tablet (5 mg total) by mouth every 4 (four) hours as needed for severe pain.    Dispense:  30 tablet    Refill:  0  . tiZANidine (ZANAFLEX) 4 MG tablet    Sig: Take 1 tablet (4 mg total) by mouth every 8 (eight) hours as needed for muscle spasms.    Dispense:  30 tablet    Refill:  0  . ibuprofen (ADVIL) 800 MG tablet    Sig: Take 1 tablet (800 mg total) by mouth every 8 (eight) hours as needed.    Dispense:  30 tablet    Refill:  0      Procedures: No procedures performed   Clinical Data: No additional findings.   Subjective: Chief Complaint  Patient presents with  . Right Shoulder - Pain, Fracture    S/p fall on 03/07/2019 - fracture right humerus  . right rib fracture x 2  The patient is a very pleasant  58 year old female who is referred from emergency room after having a mechanical fall on 03/07/2019.  This was accidental fall down some steps.  She sustained a right proximal humerus fracture as well as at least 2 rib fractures on the right side.  She is placed in a sling and given follow-up in our office.  She denies numbness tingling in the hand.  She was already trying to recover from a concussion from another fall she had about 9 weeks ago.  She does have a history of multiple sclerosis as well.  She has had gastric bypass surgery in the past.  There is a long list of other medical issues that she has had as well.  She reports severe pain.  She was given oxycodone and says it does not really work for her.  I am certainly concerned there may be a high risk for other medication issues.  HPI  Review of Systems She currently denies any headache, shortness of breath, fever, chills, nausea, vomiting  Objective: Vital Signs: LMP 08/13/2014 Comment: very few periods  Physical Exam She  is alert and orient x3 and in no acute distress but obvious discomfort Ortho Exam Examination of her right shoulder shows that is well located but significantly swollen bruising around the shoulder girdle.  Her elbow hand and wrist exam on the right side are normal.  I did not put her shoulder through range of motion given the known fracture. Specialty Comments:  No specialty comments available.  Imaging: No results found. X-rays independently reviewed of her right proximal humerus and shoulder do show a three-part proximal humerus fracture that is nondisplaced.  There may be a fracture line of the glenoid.  There is at least 2 rib fractures on the right side of the proximal ribs.  PMFS History: Patient Active Problem List   Diagnosis Date Noted  . Multiple sclerosis exacerbation (HCC) 11/16/2018  . Exacerbation of multiple sclerosis (HCC) 11/16/2018  . Pneumonia 09/26/2018  . Elevated troponin 09/26/2018  .  Acute hypokalemia 09/25/2018  . Arthritis of carpometacarpal joint 03/27/2018  . Nocturnal leg cramps 03/26/2018  . Syncope due to orthostatic hypotension 02/12/2018  . Diarrhea 02/08/2018  . Acute lower UTI 02/08/2018  . Hematoma of scalp 02/08/2018  . Neck pain 01/20/2018  . Foreign body in esophagus   . Esophageal stricture   . Intractable nausea and vomiting 01/31/2017  . Dysphagia   . Severe protein-calorie malnutrition (HCC)   . Nausea & vomiting 01/30/2017  . Hypokalemia 01/30/2017  . Acute kidney injury (HCC) 01/30/2017  . Cystitis 01/30/2017  . Costochondritis 05/11/2016  . Trochanteric bursitis of both hips 03/28/2016  . Upper back pain 03/28/2016  . Bilateral low back pain with bilateral sciatica 07/13/2015  . Right hip pain 07/13/2015  . Lumbar radicular pain 03/21/2015  . Other fatigue 01/11/2015  . Dysesthesia 01/11/2015  . Urinary urgency 01/11/2015  . Multiple sclerosis (HCC) 10/06/2014  . Numbness 10/06/2014  . Ataxic gait 10/06/2014  . High risk medication use 10/06/2014  . Gastric bypass status for obesity 10/06/2014  . Cognitive changes 10/06/2014  . Depression with anxiety 10/06/2014  . Restless leg 10/06/2014  . Insomnia 10/06/2014  . Essential hypertension 10/06/2014  . Difficulty hearing 01/13/2014  . Other muscle spasm 01/13/2014   Past Medical History:  Diagnosis Date  . Alcohol abuse   . Asthma   . Esophagitis   . GERD (gastroesophageal reflux disease)   . Headache   . Hearing loss   . Hypertension   . Multiple sclerosis (HCC)   . Pancreatitis   . Vision abnormalities     Family History  Problem Relation Age of Onset  . Cancer Mother   . Stroke Mother   . Cancer Father     Past Surgical History:  Procedure Laterality Date  . ANKLE FRACTURE SURGERY Bilateral   . CARPAL TUNNEL RELEASE Bilateral   . CHOLECYSTECTOMY    . COLONOSCOPY  11/2016   multiple  . ESOPHAGOGASTRODUODENOSCOPY  11/2016  . ESOPHAGOGASTRODUODENOSCOPY N/A  02/01/2017   Procedure: ESOPHAGOGASTRODUODENOSCOPY (EGD);  Surgeon: Iva BoopGessner, Carl E, MD;  [facility];  Service: Endoscopy;  Laterality: N/A;  . ESOPHAGOGASTRODUODENOSCOPY (EGD) WITH PROPOFOL N/A 02/04/2017   Procedure: ESOPHAGOGASTRODUODENOSCOPY (EGD) WITH PROPOFOL;  Surgeon: Meryl DareStark, Malcolm T, MD;  [facility];  Service: Endoscopy;  Laterality: N/A;  . LAPAROSCOPIC GASTRIC SLEEVE RESECTION  2011  . ULNAR NERVE REPAIR Bilateral    Social History   Occupational History  . Not on file  Tobacco Use  . Smoking status: Never Smoker  . Smokeless tobacco: Never Used  .  Tobacco comment: second hand smoker when young. Heavy exposure.  Substance and Sexual Activity  . Alcohol use: Yes    Comment: Sts. she drinks beer, liquor daily, varying amts/fim  . Drug use: No  . Sexual activity: Never

## 2019-03-13 ENCOUNTER — Telehealth: Payer: Self-pay | Admitting: Medical

## 2019-03-13 MED ORDER — OLMESARTAN MEDOXOMIL 5 MG PO TABS: 10.0000 mg | ORAL_TABLET | Freq: Every day | ORAL | 1 refills | Status: DC

## 2019-03-13 NOTE — Addendum Note (Signed)
Addended by: Anabel Halon on: 03/13/2019 09:30 PM   Modules accepted: Orders

## 2019-03-13 NOTE — Telephone Encounter (Signed)
Copied from [place]. Topic: Quick Communication - Rx Refill/Question >> Mar 13, 2019  1:20 PM Nils Flack, Melissa J wrote: Medication: losartan 25 mg   Has the patient contacted their pharmacy? Yes.   (Agent: If no, request that the patient contact the pharmacy for the refill.) (Agent: If yes, when and what did the pharmacy advise?)  Preferred Pharmacy (with phone number or street name): [facility]  Med is on back order, needs something else called in Per pt, pharm has called multiple times   Agent: Please be advised that RX refills may take up to 3 business days. We ask that you follow-up with your pharmacy.

## 2019-03-13 NOTE — Telephone Encounter (Signed)
benicar sent to pt pharmacy in place of losartan since not available. Advise pt to check bp daily over next week. I advised take benicar 10 mg daily. If systolic/top number less than 120 over next week then just use 5 mg dose. Give [place] update how your blood pressure is in 7-10 days.Marland Kitchen

## 2019-03-16 NOTE — Telephone Encounter (Signed)
Left pt vm notifying her of message below.

## 2019-03-21 ENCOUNTER — Telehealth: Payer: Self-pay | Admitting: Medical

## 2019-03-21 NOTE — Telephone Encounter (Signed)
Opened to review 

## 2019-03-21 NOTE — Telephone Encounter (Signed)
benicar rx already sent.

## 2019-04-01 ENCOUNTER — Ambulatory Visit (INDEPENDENT_AMBULATORY_CARE_PROVIDER_SITE_OTHER): Payer: Federal, State, Local not specified - PPO

## 2019-04-01 ENCOUNTER — Encounter: Payer: Self-pay | Admitting: Orthopaedic Surgery

## 2019-04-01 ENCOUNTER — Ambulatory Visit (INDEPENDENT_AMBULATORY_CARE_PROVIDER_SITE_OTHER): Payer: Federal, State, Local not specified - PPO | Admitting: Orthopaedic Surgery

## 2019-04-01 DIAGNOSIS — M25511 Pain in right shoulder: Secondary | ICD-10-CM

## 2019-04-01 DIAGNOSIS — S42291D Other displaced fracture of upper end of right humerus, subsequent encounter for fracture with routine healing: Secondary | ICD-10-CM

## 2019-04-01 DIAGNOSIS — S42293D Other displaced fracture of upper end of unspecified humerus, subsequent encounter for fracture with routine healing: Secondary | ICD-10-CM | POA: Insufficient documentation

## 2019-04-01 NOTE — Progress Notes (Signed)
By the end of the week the patient will be 4 weeks status post a right proximal humerus fracture.  This was after mechanical fall.  The fracture was minimally displaced.  She has been wearing a sling and reports decreased pain.  On exam her shoulder moves as a unit on the right side and is clinically well located.  There is certainly stiffness to be expected.  A single AP view of the right shoulder shows is well located in the fracture is minimally displaced.  At this point she will continue her sling but coming in and out of it as comfort allows.  She will still avoid overhead activities for at least 2 more weeks.  I would like to see her back in 4 weeks.  At that visit I would like internal and external tender views of the right shoulder as well as an axillary view.  All question concerns were answered addressed.

## 2019-04-07 ENCOUNTER — Other Ambulatory Visit: Payer: Self-pay | Admitting: Medical

## 2019-04-10 DIAGNOSIS — M9901 Segmental and somatic dysfunction of cervical region: Secondary | ICD-10-CM | POA: Diagnosis not present

## 2019-04-10 DIAGNOSIS — M9903 Segmental and somatic dysfunction of lumbar region: Secondary | ICD-10-CM | POA: Diagnosis not present

## 2019-04-10 DIAGNOSIS — M9902 Segmental and somatic dysfunction of thoracic region: Secondary | ICD-10-CM | POA: Diagnosis not present

## 2019-04-10 DIAGNOSIS — M791 Myalgia, unspecified site: Secondary | ICD-10-CM | POA: Diagnosis not present

## 2019-04-12 ENCOUNTER — Other Ambulatory Visit: Payer: Self-pay

## 2019-04-12 ENCOUNTER — Emergency Department (HOSPITAL_BASED_OUTPATIENT_CLINIC_OR_DEPARTMENT_OTHER)
Admission: EM | Admit: 2019-04-12 | Discharge: 2019-04-12 | Disposition: A | Discharge status: Home / Self Care | Payer: Federal, State, Local not specified - PPO | Attending: Emergency Medicine | Admitting: Emergency Medicine

## 2019-04-12 ENCOUNTER — Encounter (HOSPITAL_BASED_OUTPATIENT_CLINIC_OR_DEPARTMENT_OTHER): Payer: Self-pay | Admitting: Emergency Medicine

## 2019-04-12 ENCOUNTER — Emergency Department (HOSPITAL_BASED_OUTPATIENT_CLINIC_OR_DEPARTMENT_OTHER): Payer: Federal, State, Local not specified - PPO

## 2019-04-12 DIAGNOSIS — S01512A Laceration without foreign body of oral cavity, initial encounter: Secondary | ICD-10-CM | POA: Diagnosis not present

## 2019-04-12 DIAGNOSIS — M79602 Pain in left arm: Secondary | ICD-10-CM | POA: Insufficient documentation

## 2019-04-12 DIAGNOSIS — S01419A Laceration without foreign body of unspecified cheek and temporomandibular area, initial encounter: Secondary | ICD-10-CM | POA: Diagnosis not present

## 2019-04-12 DIAGNOSIS — F1020 Alcohol dependence, uncomplicated: Secondary | ICD-10-CM | POA: Diagnosis not present

## 2019-04-12 DIAGNOSIS — I10 Essential (primary) hypertension: Secondary | ICD-10-CM | POA: Diagnosis not present

## 2019-04-12 DIAGNOSIS — Z79899 Other long term (current) drug therapy: Secondary | ICD-10-CM | POA: Diagnosis not present

## 2019-04-12 DIAGNOSIS — S0003XA Contusion of scalp, initial encounter: Secondary | ICD-10-CM | POA: Diagnosis not present

## 2019-04-12 DIAGNOSIS — W19XXXA Unspecified fall, initial encounter: Secondary | ICD-10-CM | POA: Diagnosis not present

## 2019-04-12 DIAGNOSIS — Y999 Unspecified external cause status: Secondary | ICD-10-CM | POA: Insufficient documentation

## 2019-04-12 DIAGNOSIS — S0990XA Unspecified injury of head, initial encounter: Secondary | ICD-10-CM | POA: Diagnosis not present

## 2019-04-12 DIAGNOSIS — Y9301 Activity, walking, marching and hiking: Secondary | ICD-10-CM | POA: Insufficient documentation

## 2019-04-12 DIAGNOSIS — Z886 Allergy status to analgesic agent status: Secondary | ICD-10-CM | POA: Insufficient documentation

## 2019-04-12 DIAGNOSIS — J45909 Unspecified asthma, uncomplicated: Secondary | ICD-10-CM | POA: Insufficient documentation

## 2019-04-12 DIAGNOSIS — G35 Multiple sclerosis: Secondary | ICD-10-CM | POA: Diagnosis not present

## 2019-04-12 DIAGNOSIS — S199XXA Unspecified injury of neck, initial encounter: Secondary | ICD-10-CM | POA: Diagnosis not present

## 2019-04-12 DIAGNOSIS — R58 Hemorrhage, not elsewhere classified: Secondary | ICD-10-CM

## 2019-04-12 DIAGNOSIS — S40022A Contusion of left upper arm, initial encounter: Secondary | ICD-10-CM | POA: Diagnosis not present

## 2019-04-12 DIAGNOSIS — S01511A Laceration without foreign body of lip, initial encounter: Secondary | ICD-10-CM | POA: Diagnosis not present

## 2019-04-12 DIAGNOSIS — Y92008 Other place in unspecified non-institutional (private) residence as the place of occurrence of the external cause: Secondary | ICD-10-CM | POA: Insufficient documentation

## 2019-04-12 MED ORDER — LIDOCAINE-EPINEPHRINE (PF) 1 %-1:200000 IJ SOLN
INTRAMUSCULAR | Status: AC
  Filled 2019-04-12: qty 10

## 2019-04-12 MED ORDER — LIDOCAINE 5 % EX PTCH: 1.0000 | MEDICATED_PATCH | CUTANEOUS | 0 refills | Status: DC

## 2019-04-12 MED ORDER — ACETAMINOPHEN 500 MG PO TABS
1000.0000 mg | ORAL_TABLET | Freq: Once | ORAL | Status: AC
  Administered 2019-04-12: 1000 mg via ORAL
  Filled 2019-04-12: qty 2

## 2019-04-12 NOTE — ED Provider Notes (Signed)
[facility] EMERGENCY DEPARTMENT Provider Note   CSN: [id number] Arrival date & time: 04/12/19  4098     History   Chief Complaint Chief Complaint  Patient presents with   Fall    HPI Catherine Hubbard is a 58 y.o. female.     The history is provided by the patient.  Fall This is a recurrent problem. The current episode started less than 1 hour ago. The problem occurs constantly. The problem has not changed since onset.Pertinent negatives include no chest pain, no abdominal pain, no headaches and no shortness of breath. Associated symptoms comments: Mouth laceration. Nothing aggravates the symptoms. Nothing relieves the symptoms. She has tried nothing for the symptoms. The treatment provided no relief.  Mouth laceration and scalp hematoma.  Hit the back of her head.    Past Medical History:  Diagnosis Date   Alcohol abuse    Asthma    Esophagitis    GERD (gastroesophageal reflux disease)    Headache    Hearing loss    Hypertension    Multiple sclerosis (HCC)    Pancreatitis    Vision abnormalities     Patient Active Problem List   Diagnosis Date Noted   Closed 3-part fracture of proximal humerus with routine healing 04/01/2019   Multiple sclerosis exacerbation (HCC) 11/16/2018   Exacerbation of multiple sclerosis (HCC) 11/16/2018   Pneumonia 09/26/2018   Elevated troponin 09/26/2018   Acute hypokalemia 09/25/2018   Arthritis of carpometacarpal joint 03/27/2018   Nocturnal leg cramps 03/26/2018   Syncope due to orthostatic hypotension 02/12/2018   Diarrhea 02/08/2018   Acute lower UTI 02/08/2018   Hematoma of scalp 02/08/2018   Neck pain 01/20/2018   Foreign body in esophagus    Esophageal stricture    Intractable nausea and vomiting 01/31/2017   Dysphagia    Severe protein-calorie malnutrition (HCC)    Nausea & vomiting 01/30/2017   Hypokalemia 01/30/2017   Acute kidney injury (HCC) 01/30/2017   Cystitis  01/30/2017   Costochondritis 05/11/2016   Trochanteric bursitis of both hips 03/28/2016   Upper back pain 03/28/2016   Bilateral low back pain with bilateral sciatica 07/13/2015   Right hip pain 07/13/2015   Lumbar radicular pain 03/21/2015   Other fatigue 01/11/2015   Dysesthesia 01/11/2015   Urinary urgency 01/11/2015   Multiple sclerosis (HCC) 10/06/2014   Numbness 10/06/2014   Ataxic gait 10/06/2014   High risk medication use 10/06/2014   Gastric bypass status for obesity 10/06/2014   Cognitive changes 10/06/2014   Depression with anxiety 10/06/2014   Restless leg 10/06/2014   Insomnia 10/06/2014   Essential hypertension 10/06/2014   Difficulty hearing 01/13/2014   Other muscle spasm 01/13/2014    Past Surgical History:  Procedure Laterality Date   ANKLE FRACTURE SURGERY Bilateral    CARPAL TUNNEL RELEASE Bilateral    CHOLECYSTECTOMY     COLONOSCOPY  11/2016   multiple   ESOPHAGOGASTRODUODENOSCOPY  11/2016   ESOPHAGOGASTRODUODENOSCOPY N/A 02/01/2017   Procedure: ESOPHAGOGASTRODUODENOSCOPY (EGD);  Surgeon: Iva Boop, MD;  Location: [facility] ENDOSCOPY;  Service: Endoscopy;  Laterality: N/A;   ESOPHAGOGASTRODUODENOSCOPY (EGD) WITH PROPOFOL N/A 02/04/2017   Procedure: ESOPHAGOGASTRODUODENOSCOPY (EGD) WITH PROPOFOL;  Surgeon: Meryl Dare, MD;  Location: [facility] ENDOSCOPY;  Service: Endoscopy;  Laterality: N/A;   LAPAROSCOPIC GASTRIC SLEEVE RESECTION  2011   ULNAR NERVE REPAIR Bilateral      OB History   No obstetric history on file.      Home Medications  Prior to Admission medications   Medication Sig Start Date End Date Taking? Authorizing Provider  baclofen (LIORESAL) 10 MG tablet Take 1 tablet (10 mg total) by mouth 3 (three) times daily. Patient not taking: Reported on 03/11/2019 08/26/18   Sater, Pearletha Furlichard A, MD  busPIRone (BUSPAR) 7.5 MG tablet TAKE 1 TABLET (7.5 MG TOTAL) BY MOUTH 2 (TWO) TIMES DAILY. Patient not taking: Reported on  03/11/2019 02/02/19   Saguier, Ramon DredgeEdward, PA-C  Cholecalciferol (D2000 ULTRA STRENGTH) 50 MCG (2000 UT) CAPS Take by mouth.    [provider]  DULoxetine (CYMBALTA) 60 MG capsule Take 1 capsule (60 mg total) by mouth daily. 08/26/18   Sater, Pearletha Furlichard A, MD  hydrochlorothiazide (HYDRODIURIL) 25 MG tablet TAKE 1 TABLET BY MOUTH EVERY DAY 04/07/19   Saguier, Ramon DredgeEdward, PA-C  HYDROcodone-acetaminophen (NORCO) 5-325 MG tablet Take 1 tablet by mouth every 6 (six) hours as needed for moderate pain. 02/05/19   Saguier, Ramon DredgeEdward, PA-C  ibuprofen (ADVIL) 800 MG tablet Take 1 tablet (800 mg total) by mouth every 8 (eight) hours as needed. 03/11/19   Kathryne HitchBlackman, Christopher Y, MD  modafinil (PROVIGIL) 200 MG tablet TAKE 1 TABLET BY MOUTH EVERY DAY Patient not taking: No sig reported 01/05/19   Sater, Pearletha Furlichard A, MD  olmesartan (BENICAR) 5 MG tablet Take 2 tablets (10 mg total) by mouth daily. 03/13/19   Saguier, Ramon DredgeEdward, PA-C  omeprazole (PRILOSEC) 20 MG capsule Take 2 capsules (40 mg total) by mouth 2 (two) times daily. 11/13/18   Saguier, Ramon DredgeEdward, PA-C  oxyCODONE (ROXICODONE) 5 MG immediate release tablet Take 1 tablet (5 mg total) by mouth every 4 (four) hours as needed for severe pain. 03/11/19   Kathryne HitchBlackman, Christopher Y, MD  sucralfate (CARAFATE) 1 g tablet TAKE 1 TABLET (1 G TOTAL) BY MOUTH 4 (FOUR) TIMES DAILY - WITH MEALS AND AT BEDTIME. Patient not taking: Reported on 03/11/2019 02/02/19   Saguier, Ramon DredgeEdward, PA-C  tiZANidine (ZANAFLEX) 4 MG tablet Take 1 tablet (4 mg total) by mouth every 8 (eight) hours as needed for muscle spasms. 03/11/19   Kathryne HitchBlackman, Christopher Y, MD  albuterol (PROVENTIL) (2.5 MG/3ML) 0.083% nebulizer solution Take 3 mLs (2.5 mg total) by nebulization every 6 (six) hours as needed for wheezing or shortness of breath. Patient not taking: Reported on 01/29/2019 10/31/18 03/07/19  Saguier, Ramon DredgeEdward, PA-C  furosemide (LASIX) 20 MG tablet Take 1 tablet (20 mg total) by mouth daily. Patient not taking: Reported  on 01/29/2019 01/05/19 03/07/19  Benjiman CorePickering, Nathan, MD  potassium chloride SA (K-DUR) 20 MEQ tablet Take 1 tablet (20 mEq total) by mouth 2 (two) times daily. Patient not taking: Reported on 01/29/2019 01/05/19 03/07/19  Benjiman CorePickering, Nathan, MD    Family History Family History  Problem Relation Age of Onset   Cancer Mother    Stroke Mother    Cancer Father     Social History Social History   Tobacco Use   Smoking status: Never Smoker   Smokeless tobacco: Never Used   Tobacco comment: second hand smoker when young. Heavy exposure.  Substance Use Topics   Alcohol use: Yes    Comment: Sts. she drinks beer, liquor daily, varying amts/fim   Drug use: No     Allergies   Ibuprofen and Nsaids   Review of Systems Review of Systems  Constitutional: Negative for fever.  HENT: Negative for congestion.   Eyes: Negative for visual disturbance.  Respiratory: Negative for shortness of breath.   Cardiovascular: Negative for chest pain.  Gastrointestinal: Negative for  abdominal pain.  Genitourinary: Negative for difficulty urinating.  Musculoskeletal: Negative for back pain, gait problem, joint swelling, neck pain and neck stiffness.  Neurological: Negative for weakness, numbness and headaches.  All other systems reviewed and are negative.    Physical Exam Updated Vital Signs BP 138/89 (BP Location: Left Arm)    Pulse 84    Temp 98.2 F (36.8 C) (Oral)    Resp 20    Ht 5\' 5"  (1.651 m)    Wt 68 kg    LMP 08/13/2014 Comment: very few periods   SpO2 95%    BMI 24.96 kg/m   Physical Exam Vitals signs and nursing note reviewed.  Constitutional:      Appearance: She is normal weight.  HENT:     Head: Normocephalic. No raccoon eyes or Battle's sign.      Right Ear: No mastoid tenderness. No hemotympanum.     Left Ear: No mastoid tenderness. No hemotympanum.     Nose: Nose normal.     Mouth/Throat:     Mouth: Mucous membranes are moist.     Pharynx: Oropharynx is clear.    Eyes:     Conjunctiva/sclera: Conjunctivae normal.     Pupils: Pupils are equal, round, and reactive to light.  Neck:     Musculoskeletal: Normal range of motion and neck supple.  Cardiovascular:     Rate and Rhythm: Normal rate and regular rhythm.     Pulses: Normal pulses.     Heart sounds: Normal heart sounds.  Pulmonary:     Effort: Pulmonary effort is normal.     Breath sounds: Normal breath sounds.  Abdominal:     General: Abdomen is flat. Bowel sounds are normal.     Tenderness: There is no abdominal tenderness. There is no guarding.  Neurological:     Mental Status: She is alert.      ED Treatments / Results  Labs (all labs ordered are listed, but only abnormal results are displayed) Labs Reviewed - No data to display  EKG None  Radiology Ct Head Wo Contrast  Result Date: 04/12/2019 CLINICAL DATA:  Striking head on floor. EXAM: CT HEAD WITHOUT CONTRAST CT MAXILLOFACIAL WITHOUT CONTRAST CT CERVICAL SPINE WITHOUT CONTRAST TECHNIQUE: Multidetector CT imaging of the head, cervical spine, and maxillofacial structures were performed using the standard protocol without intravenous contrast. Multiplanar CT image reconstructions of the cervical spine and maxillofacial structures were also generated. COMPARISON:  Head and cervical spine CT 01/29/2019 FINDINGS: CT HEAD FINDINGS Brain: Stable degree of atrophy and chronic small vessel ischemia, advanced for age. No intracranial hemorrhage, mass effect, or midline shift. No hydrocephalus. The basilar cisterns are patent. No evidence of territorial infarct or acute ischemia. No extra-axial or intracranial fluid collection. Vascular: No hyperdense vessel. Skull: No fracture or focal lesion. Other: Left occipital scalp hematoma. CT MAXILLOFACIAL FINDINGS Osseous: Zygomatic arches, nasal bone, mandibles are intact. Temporomandibular joints are congruent. Orbits: No orbital fracture. Both orbits and globes are intact. Leftward gaze.  Sinuses: No sinus fracture or fluid level. Paranasal sinuses are clear. Minimal chronic left mastoid air cell opacification. Soft tissues: Laceration overlying the midline of the mandible. No confluent hematoma. CT CERVICAL SPINE FINDINGS Alignment: Normal. Skull base and vertebrae: No acute fracture. Vertebral body heights are maintained. The dens and skull base are intact. Soft tissues and spinal canal: No prevertebral fluid or swelling. No visible canal hematoma. Disc levels: Mild diffuse disc space narrowing and endplate spurring. Scattered facet hypertrophy. Upper chest: Negative.  Other: None. IMPRESSION: 1. Left occipital scalp hematoma. No acute intracranial abnormality. No skull fracture. 2. Stable degree of atrophy and chronic small vessel ischemia, advanced for age. 3. No facial bone fracture. Laceration overlying the mid lower lip. 4. Mild degenerative change in the cervical spine without acute fracture or subluxation. Electronically Signed   By: Narda Rutherford M.D.   On: 04/12/2019 04:01   Ct Cervical Spine Wo Contrast  Result Date: 04/12/2019 CLINICAL DATA:  Striking head on floor. EXAM: CT HEAD WITHOUT CONTRAST CT MAXILLOFACIAL WITHOUT CONTRAST CT CERVICAL SPINE WITHOUT CONTRAST TECHNIQUE: Multidetector CT imaging of the head, cervical spine, and maxillofacial structures were performed using the standard protocol without intravenous contrast. Multiplanar CT image reconstructions of the cervical spine and maxillofacial structures were also generated. COMPARISON:  Head and cervical spine CT 01/29/2019 FINDINGS: CT HEAD FINDINGS Brain: Stable degree of atrophy and chronic small vessel ischemia, advanced for age. No intracranial hemorrhage, mass effect, or midline shift. No hydrocephalus. The basilar cisterns are patent. No evidence of territorial infarct or acute ischemia. No extra-axial or intracranial fluid collection. Vascular: No hyperdense vessel. Skull: No fracture or focal lesion. Other: Left  occipital scalp hematoma. CT MAXILLOFACIAL FINDINGS Osseous: Zygomatic arches, nasal bone, mandibles are intact. Temporomandibular joints are congruent. Orbits: No orbital fracture. Both orbits and globes are intact. Leftward gaze. Sinuses: No sinus fracture or fluid level. Paranasal sinuses are clear. Minimal chronic left mastoid air cell opacification. Soft tissues: Laceration overlying the midline of the mandible. No confluent hematoma. CT CERVICAL SPINE FINDINGS Alignment: Normal. Skull base and vertebrae: No acute fracture. Vertebral body heights are maintained. The dens and skull base are intact. Soft tissues and spinal canal: No prevertebral fluid or swelling. No visible canal hematoma. Disc levels: Mild diffuse disc space narrowing and endplate spurring. Scattered facet hypertrophy. Upper chest: Negative. Other: None. IMPRESSION: 1. Left occipital scalp hematoma. No acute intracranial abnormality. No skull fracture. 2. Stable degree of atrophy and chronic small vessel ischemia, advanced for age. 3. No facial bone fracture. Laceration overlying the mid lower lip. 4. Mild degenerative change in the cervical spine without acute fracture or subluxation. Electronically Signed   By: Narda Rutherford M.D.   On: 04/12/2019 04:01   Ct Maxillofacial Wo Contrast  Result Date: 04/12/2019 CLINICAL DATA:  Striking head on floor. EXAM: CT HEAD WITHOUT CONTRAST CT MAXILLOFACIAL WITHOUT CONTRAST CT CERVICAL SPINE WITHOUT CONTRAST TECHNIQUE: Multidetector CT imaging of the head, cervical spine, and maxillofacial structures were performed using the standard protocol without intravenous contrast. Multiplanar CT image reconstructions of the cervical spine and maxillofacial structures were also generated. COMPARISON:  Head and cervical spine CT 01/29/2019 FINDINGS: CT HEAD FINDINGS Brain: Stable degree of atrophy and chronic small vessel ischemia, advanced for age. No intracranial hemorrhage, mass effect, or midline shift. No  hydrocephalus. The basilar cisterns are patent. No evidence of territorial infarct or acute ischemia. No extra-axial or intracranial fluid collection. Vascular: No hyperdense vessel. Skull: No fracture or focal lesion. Other: Left occipital scalp hematoma. CT MAXILLOFACIAL FINDINGS Osseous: Zygomatic arches, nasal bone, mandibles are intact. Temporomandibular joints are congruent. Orbits: No orbital fracture. Both orbits and globes are intact. Leftward gaze. Sinuses: No sinus fracture or fluid level. Paranasal sinuses are clear. Minimal chronic left mastoid air cell opacification. Soft tissues: Laceration overlying the midline of the mandible. No confluent hematoma. CT CERVICAL SPINE FINDINGS Alignment: Normal. Skull base and vertebrae: No acute fracture. Vertebral body heights are maintained. The dens and skull base are intact. Soft  tissues and spinal canal: No prevertebral fluid or swelling. No visible canal hematoma. Disc levels: Mild diffuse disc space narrowing and endplate spurring. Scattered facet hypertrophy. Upper chest: Negative. Other: None. IMPRESSION: 1. Left occipital scalp hematoma. No acute intracranial abnormality. No skull fracture. 2. Stable degree of atrophy and chronic small vessel ischemia, advanced for age. 3. No facial bone fracture. Laceration overlying the mid lower lip. 4. Mild degenerative change in the cervical spine without acute fracture or subluxation. Electronically Signed   By: Narda RutherfordMelanie  Sanford M.D.   On: 04/12/2019 04:01    Procedures .Marland Kitchen.Laceration Repair  Date/Time: 04/12/2019 5:26 AM Performed by: Cy BlamerPalumbo, Berish Bohman, MD Authorized by: Cy BlamerPalumbo, Ishanvi Mcquitty, MD   Consent:    Consent obtained:  Verbal   Consent given by:  Patient   Risks discussed:  Infection, need for additional repair, nerve damage, pain, poor cosmetic result, poor wound healing, retained foreign body, tendon damage and vascular damage   Alternatives discussed:  No treatment Anesthesia (see MAR for exact  dosages):    Anesthesia method:  Local infiltration   Local anesthetic:  Lidocaine 1% WITH epi Laceration details:    Location: lower mouth mucosa between lower gum and mucosa.   Length (cm):  1.8   Depth (mm):  3 Repair type:    Repair type:  Simple Pre-procedure details:    Preparation:  Patient was prepped and draped in usual sterile fashion Exploration:    Hemostasis achieved with:  Direct pressure   Wound exploration: wound explored through full range of motion     Wound extent: no areolar tissue violation noted, no fascia violation noted, no foreign bodies/material noted, no muscle damage noted, no nerve damage noted, no tendon damage noted, no underlying fracture noted and no vascular damage noted     Contaminated: no   Treatment:    Amount of cleaning:  Standard   Irrigation solution:  Sterile saline Skin repair:    Repair method:  Sutures   Suture size:  3-0   Wound skin closure material used: vicryl.   Suture technique:  Simple interrupted   Number of sutures:  2 Approximation:    Approximation:  Close Post-procedure details:    Dressing:  Open (no dressing)   Patient tolerance of procedure:  Tolerated well, no immediate complications   (including critical care time)  Medications Ordered in ED Medications  lidocaine-EPINEPHrine (XYLOCAINE-EPINEPHrine) 1 %-1:200000 (PF) injection (has no administration in time range)  acetaminophen (TYLENOL) tablet 1,000 mg (has no administration in time range)     Multiple falls.  I do not think narcotics are beneficial in a patient with alcohol abuse and frequent falls.  Lidoderm patches prescribed.  Salt water to cleanse mouth 3 times a day.  Ice the ecchymosis of the chin  Catherine Hubbard was evaluated in Emergency Department on 04/12/2019 for the symptoms described in the history of present illness. She was evaluated in the context of the global COVID-19 pandemic, which necessitated consideration that the patient might  be at risk for infection with the SARS-CoV-2 virus that causes COVID-19. Institutional protocols and algorithms that pertain to the evaluation of patients at risk for COVID-19 are in a state of rapid change based on information released by regulatory bodies including the CDC and federal and state organizations. These policies and algorithms were followed during the patient's care in the ED.   Final Clinical Impressions(s) / ED Diagnoses   Return for intractable cough, coughing up blood,fevers >100.4 unrelieved by medication, shortness of breath,  intractable vomiting, chest pain, shortness of breath, weakness,numbness, changes in speech, facial asymmetry,abdominal pain, passing out,Inability to tolerate liquids or food, cough, altered mental status or any concerns. No signs of systemic illness or infection. The patient is nontoxic-appearing on exam and vital signs are within normal limits.   I have reviewed the triage vital signs and the nursing notes. Pertinent labs &imaging results that were available during my care of the patient were reviewed by me and considered in my medical decision making (see chart for details).  After history, exam, and medical workup I feel the patient has been appropriately medically screened and is safe for discharge home. Pertinent diagnoses were discussed with the patient. Patient was given return precautions   Tykee Heideman, MD 04/12/19 1914

## 2019-04-12 NOTE — ED Notes (Signed)
ED Provider at bedside. 

## 2019-04-12 NOTE — ED Triage Notes (Signed)
Pt states she got up to go to the bathroom and fell striking her head and lip on the floor. She fell outside after that striking her right arm also. Denies LOC and denies anticoaguatlion

## 2019-04-12 NOTE — ED Notes (Signed)
Pt requesting ice pack. Pt given ice pack. Upon entering the room EMT noticed pt's mask covered in blood from laceration. EMT cleaned pt up and gave pt new mask. Pt showed EMT laceration. Laceration is on the inside of the lower lip.

## 2019-04-12 NOTE — ED Notes (Signed)
Pt understood dc material. NAD Noted. All questions answered to satisfaction. Pt escorted to check out counter. 

## 2019-04-14 ENCOUNTER — Encounter (HOSPITAL_BASED_OUTPATIENT_CLINIC_OR_DEPARTMENT_OTHER): Payer: Self-pay | Admitting: Emergency Medicine

## 2019-04-14 ENCOUNTER — Telehealth: Payer: Self-pay | Admitting: Medical

## 2019-04-14 ENCOUNTER — Other Ambulatory Visit: Payer: Self-pay

## 2019-04-14 ENCOUNTER — Inpatient Hospital Stay (HOSPITAL_BASED_OUTPATIENT_CLINIC_OR_DEPARTMENT_OTHER)
Admission: EM | Admit: 2019-04-14 | Discharge: 2019-04-17 | DRG: 184 | Disposition: A | Discharge status: Home / Self Care | Payer: Federal, State, Local not specified - PPO | Attending: General Surgery | Admitting: General Surgery

## 2019-04-14 ENCOUNTER — Emergency Department (HOSPITAL_BASED_OUTPATIENT_CLINIC_OR_DEPARTMENT_OTHER): Payer: Federal, State, Local not specified - PPO

## 2019-04-14 DIAGNOSIS — J45909 Unspecified asthma, uncomplicated: Secondary | ICD-10-CM | POA: Diagnosis not present

## 2019-04-14 DIAGNOSIS — K219 Gastro-esophageal reflux disease without esophagitis: Secondary | ICD-10-CM | POA: Diagnosis not present

## 2019-04-14 DIAGNOSIS — S2242XA Multiple fractures of ribs, left side, initial encounter for closed fracture: Secondary | ICD-10-CM | POA: Diagnosis not present

## 2019-04-14 DIAGNOSIS — S42201D Unspecified fracture of upper end of right humerus, subsequent encounter for fracture with routine healing: Secondary | ICD-10-CM | POA: Diagnosis not present

## 2019-04-14 DIAGNOSIS — Z20828 Contact with and (suspected) exposure to other viral communicable diseases: Secondary | ICD-10-CM | POA: Diagnosis present

## 2019-04-14 DIAGNOSIS — Z823 Family history of stroke: Secondary | ICD-10-CM

## 2019-04-14 DIAGNOSIS — I10 Essential (primary) hypertension: Secondary | ICD-10-CM | POA: Diagnosis not present

## 2019-04-14 DIAGNOSIS — R296 Repeated falls: Secondary | ICD-10-CM | POA: Diagnosis not present

## 2019-04-14 DIAGNOSIS — S2221XA Fracture of manubrium, initial encounter for closed fracture: Secondary | ICD-10-CM

## 2019-04-14 DIAGNOSIS — S3991XA Unspecified injury of abdomen, initial encounter: Secondary | ICD-10-CM | POA: Diagnosis not present

## 2019-04-14 DIAGNOSIS — S2249XA Multiple fractures of ribs, unspecified side, initial encounter for closed fracture: Secondary | ICD-10-CM | POA: Diagnosis present

## 2019-04-14 DIAGNOSIS — S01511A Laceration without foreign body of lip, initial encounter: Secondary | ICD-10-CM | POA: Diagnosis not present

## 2019-04-14 DIAGNOSIS — G35 Multiple sclerosis: Secondary | ICD-10-CM

## 2019-04-14 DIAGNOSIS — S2243XA Multiple fractures of ribs, bilateral, initial encounter for closed fracture: Principal | ICD-10-CM | POA: Diagnosis present

## 2019-04-14 DIAGNOSIS — S2241XA Multiple fractures of ribs, right side, initial encounter for closed fracture: Secondary | ICD-10-CM | POA: Diagnosis not present

## 2019-04-14 DIAGNOSIS — W010XXA Fall on same level from slipping, tripping and stumbling without subsequent striking against object, initial encounter: Secondary | ICD-10-CM | POA: Diagnosis not present

## 2019-04-14 LAB — COMPREHENSIVE METABOLIC PANEL
ALT: 17 U/L (ref 0–44)
AST: 30 U/L (ref 15–41)
Albumin: 3.9 g/dL (ref 3.5–5.0)
Alkaline Phosphatase: 94 U/L (ref 38–126)
Anion gap: 12 (ref 5–15)
BUN: 15 mg/dL (ref 6–20)
CO2: 27 mmol/L (ref 22–32)
Calcium: 9.4 mg/dL (ref 8.9–10.3)
Chloride: 95 mmol/L — ABNORMAL LOW (ref 98–111)
Creatinine, Ser: 0.55 mg/dL (ref 0.44–1.00)
GFR calc Af Amer: >60 mL/min (ref >60)
GFR calc non Af Amer: >60 mL/min (ref >60)
Glucose, Bld: 97 mg/dL (ref 70–99)
Potassium: 3.5 mmol/L (ref 3.5–5.1)
Sodium: 134 mmol/L — ABNORMAL LOW (ref 135–145)
Total Bilirubin: 1.9 mg/dL — ABNORMAL HIGH (ref 0.3–1.2)
Total Protein: 6.9 g/dL (ref 6.5–8.1)

## 2019-04-14 LAB — CBC WITH DIFFERENTIAL/PLATELET
Abs Immature Granulocytes: 0.02 10*3/uL (ref 0.00–0.07)
Basophils Absolute: 0.1 10*3/uL (ref 0.0–0.1)
Basophils Relative: 1 %
Eosinophils Absolute: 0.4 10*3/uL (ref 0.0–0.5)
Eosinophils Relative: 5 %
HCT: 34.9 % — ABNORMAL LOW (ref 36.0–46.0)
Hemoglobin: 11.3 g/dL — ABNORMAL LOW (ref 12.0–15.0)
Immature Granulocytes: 0 %
Lymphocytes Relative: 11 %
Lymphs Abs: 0.9 10*3/uL (ref 0.7–4.0)
MCH: 31 pg (ref 26.0–34.0)
MCHC: 32.4 g/dL (ref 30.0–36.0)
MCV: 95.9 fL (ref 80.0–100.0)
Monocytes Absolute: 0.8 10*3/uL (ref 0.1–1.0)
Monocytes Relative: 10 %
Neutro Abs: 5.7 10*3/uL (ref 1.7–7.7)
Neutrophils Relative %: 73 %
Platelets: 242 10*3/uL (ref 150–400)
RBC: 3.64 MIL/uL — ABNORMAL LOW (ref 3.87–5.11)
RDW: 13.1 % (ref 11.5–15.5)
WBC: 7.7 10*3/uL (ref 4.0–10.5)
nRBC: 0 % (ref 0.0–0.2)

## 2019-04-14 LAB — SARS CORONAVIRUS 2 (TAT 6-24 HRS): SARS Coronavirus 2: NEGATIVE

## 2019-04-14 MED ORDER — DOCUSATE SODIUM 100 MG PO CAPS
100.0000 mg | ORAL_CAPSULE | Freq: Two times a day (BID) | ORAL | Status: DC
  Administered 2019-04-14 – 2019-04-17 (×6): 100 mg via ORAL
  Filled 2019-04-14 (×7): qty 1

## 2019-04-14 MED ORDER — ONDANSETRON HCL 4 MG/2ML IJ SOLN: 4.0000 mg | Freq: Four times a day (QID) | INTRAMUSCULAR | Status: DC | PRN

## 2019-04-14 MED ORDER — MORPHINE SULFATE (PF) 4 MG/ML IV SOLN
4.0000 mg | Freq: Once | INTRAVENOUS | Status: AC
  Administered 2019-04-14: 4 mg via INTRAVENOUS
  Filled 2019-04-14: qty 1

## 2019-04-14 MED ORDER — HYDROCHLOROTHIAZIDE 25 MG PO TABS
25.0000 mg | ORAL_TABLET | Freq: Every day | ORAL | Status: DC
  Administered 2019-04-14 – 2019-04-17 (×4): 25 mg via ORAL
  Filled 2019-04-14 (×4): qty 1

## 2019-04-14 MED ORDER — POTASSIUM CHLORIDE IN NACL 20-0.9 MEQ/L-% IV SOLN
INTRAVENOUS | Status: DC
  Administered 2019-04-14: 14:00:00 via INTRAVENOUS
  Filled 2019-04-14 (×2): qty 1000

## 2019-04-14 MED ORDER — ACETAMINOPHEN 325 MG PO TABS
650.0000 mg | ORAL_TABLET | Freq: Four times a day (QID) | ORAL | Status: DC
  Administered 2019-04-14 – 2019-04-17 (×12): 650 mg via ORAL
  Filled 2019-04-14 (×12): qty 2

## 2019-04-14 MED ORDER — IRBESARTAN 75 MG PO TABS
75.0000 mg | ORAL_TABLET | Freq: Every day | ORAL | Status: DC
  Administered 2019-04-14 – 2019-04-17 (×4): 75 mg via ORAL
  Filled 2019-04-14 (×5): qty 1

## 2019-04-14 MED ORDER — DULOXETINE HCL 60 MG PO CPEP
60.0000 mg | ORAL_CAPSULE | Freq: Every day | ORAL | Status: DC
  Administered 2019-04-14 – 2019-04-17 (×4): 60 mg via ORAL
  Filled 2019-04-14 (×4): qty 1

## 2019-04-14 MED ORDER — OXYCODONE HCL 5 MG PO TABS
10.0000 mg | ORAL_TABLET | ORAL | Status: DC | PRN
  Administered 2019-04-14 – 2019-04-17 (×9): 10 mg via ORAL
  Filled 2019-04-14 (×10): qty 2

## 2019-04-14 MED ORDER — METHOCARBAMOL 500 MG PO TABS
500.0000 mg | ORAL_TABLET | Freq: Three times a day (TID) | ORAL | Status: DC
  Administered 2019-04-14 – 2019-04-17 (×9): 500 mg via ORAL
  Filled 2019-04-14 (×9): qty 1

## 2019-04-14 MED ORDER — POLYETHYLENE GLYCOL 3350 17 G PO PACK
17.0000 g | PACK | Freq: Every day | ORAL | Status: DC | PRN
  Administered 2019-04-16: 17 g via ORAL
  Filled 2019-04-14: qty 1

## 2019-04-14 MED ORDER — ONDANSETRON HCL 4 MG/2ML IJ SOLN
4.0000 mg | Freq: Once | INTRAMUSCULAR | Status: AC
  Administered 2019-04-14: 4 mg via INTRAVENOUS
  Filled 2019-04-14: qty 2

## 2019-04-14 MED ORDER — PANTOPRAZOLE SODIUM 40 MG PO TBEC
40.0000 mg | DELAYED_RELEASE_TABLET | Freq: Every day | ORAL | Status: DC
  Administered 2019-04-14 – 2019-04-17 (×4): 40 mg via ORAL
  Filled 2019-04-14 (×4): qty 1

## 2019-04-14 MED ORDER — ENOXAPARIN SODIUM 40 MG/0.4ML ~~LOC~~ SOLN
40.0000 mg | SUBCUTANEOUS | Status: DC
  Administered 2019-04-14 – 2019-04-16 (×3): 40 mg via SUBCUTANEOUS
  Filled 2019-04-14 (×3): qty 0.4

## 2019-04-14 MED ORDER — IOHEXOL 300 MG/ML  SOLN
100.0000 mL | Freq: Once | INTRAMUSCULAR | Status: AC | PRN
  Administered 2019-04-14: 100 mL via INTRAVENOUS

## 2019-04-14 MED ORDER — METOPROLOL TARTRATE 5 MG/5ML IV SOLN: 5.0000 mg | Freq: Four times a day (QID) | INTRAVENOUS | Status: DC | PRN

## 2019-04-14 MED ORDER — IPRATROPIUM-ALBUTEROL 0.5-2.5 (3) MG/3ML IN SOLN: 3.0000 mL | Freq: Four times a day (QID) | RESPIRATORY_TRACT | Status: DC | PRN

## 2019-04-14 MED ORDER — TIZANIDINE HCL 2 MG PO TABS: 4.0000 mg | ORAL_TABLET | Freq: Three times a day (TID) | ORAL | Status: DC | PRN

## 2019-04-14 MED ORDER — HYDROMORPHONE HCL 1 MG/ML IJ SOLN: 0.5000 mg | INTRAMUSCULAR | Status: DC | PRN

## 2019-04-14 MED ORDER — ONDANSETRON 4 MG PO TBDP: 4.0000 mg | ORAL_TABLET | Freq: Four times a day (QID) | ORAL | Status: DC | PRN

## 2019-04-14 NOTE — Progress Notes (Signed)
Orthopedic Tech Progress Note Patient Details:  Catherine Hubbard 1961-04-14 [id number]  Ortho Devices Type of Ortho Device: Shoulder immobilizer Ortho Device/Splint Location: URE Ortho Device/Splint Interventions: Application, Ordered   Post Interventions Patient Tolerated: Well Instructions Provided: Care of device, Adjustment of device   Janit Pagan 04/14/2019, 2:20 PM

## 2019-04-14 NOTE — Telephone Encounter (Signed)
Opened to review old med list. DC old medications.

## 2019-04-14 NOTE — Progress Notes (Signed)
PT Cancellation Note  Patient Details Name: Catherine Hubbard MRN: [id number] DOB: 10-25-1960   Cancelled Treatment:    Reason Eval/Treat Not Completed: Other (comment) Pt just getting dinner, and requesting to eat. Will follow up as schedule allows.   Leighton Ruff, PT, DPT  Acute Rehabilitation Services  Pager: [phone] Office: [phone]    Rudean Hitt 04/14/2019, 5:01 PM

## 2019-04-14 NOTE — ED Notes (Signed)
Pt aware we need urine, pt ambulated to bathroom by family member and did not collect specimen.

## 2019-04-14 NOTE — ED Provider Notes (Signed)
[facility] EMERGENCY DEPARTMENT Provider Note   CSN: [id number] Arrival date & time: 04/14/19  [id number]     History   Chief Complaint Chief Complaint  Patient presents with   Chest Pain    HPI Catherine Hubbard is a 58 y.o. female.     Pt presents to the ED today with rib pain from fall on 8/23.  She has a hx of frequent falls.  She fell twice on the 23rd and hit the front of her face and her the back of her head.  She also hit her ribs and her left arm.  She required sutures.  She has an incentive spirometer and has not been able to do it as well as normal.  She denies any new falls.     Past Medical History:  Diagnosis Date   Alcohol abuse    Asthma    Esophagitis    GERD (gastroesophageal reflux disease)    Headache    Hearing loss    Hypertension    Multiple sclerosis (HCC)    Pancreatitis    Vision abnormalities     Patient Active Problem List   Diagnosis Date Noted   Closed 3-part fracture of proximal humerus with routine healing 04/01/2019   Multiple sclerosis exacerbation (HCC) 11/16/2018   Exacerbation of multiple sclerosis (HCC) 11/16/2018   Pneumonia 09/26/2018   Elevated troponin 09/26/2018   Acute hypokalemia 09/25/2018   Arthritis of carpometacarpal joint 03/27/2018   Nocturnal leg cramps 03/26/2018   Syncope due to orthostatic hypotension 02/12/2018   Diarrhea 02/08/2018   Acute lower UTI 02/08/2018   Hematoma of scalp 02/08/2018   Neck pain 01/20/2018   Foreign body in esophagus    Esophageal stricture    Intractable nausea and vomiting 01/31/2017   Dysphagia    Severe protein-calorie malnutrition (HCC)    Nausea & vomiting 01/30/2017   Hypokalemia 01/30/2017   Acute kidney injury (HCC) 01/30/2017   Cystitis 01/30/2017   Costochondritis 05/11/2016   Trochanteric bursitis of both hips 03/28/2016   Upper back pain 03/28/2016   Bilateral low back pain with bilateral sciatica 07/13/2015    Right hip pain 07/13/2015   Lumbar radicular pain 03/21/2015   Other fatigue 01/11/2015   Dysesthesia 01/11/2015   Urinary urgency 01/11/2015   Multiple sclerosis (HCC) 10/06/2014   Numbness 10/06/2014   Ataxic gait 10/06/2014   High risk medication use 10/06/2014   Gastric bypass status for obesity 10/06/2014   Cognitive changes 10/06/2014   Depression with anxiety 10/06/2014   Restless leg 10/06/2014   Insomnia 10/06/2014   Essential hypertension 10/06/2014   Difficulty hearing 01/13/2014   Other muscle spasm 01/13/2014    Past Surgical History:  Procedure Laterality Date   ANKLE FRACTURE SURGERY Bilateral    CARPAL TUNNEL RELEASE Bilateral    CHOLECYSTECTOMY     COLONOSCOPY  11/2016   multiple   ESOPHAGOGASTRODUODENOSCOPY  11/2016   ESOPHAGOGASTRODUODENOSCOPY N/A 02/01/2017   Procedure: ESOPHAGOGASTRODUODENOSCOPY (EGD);  Surgeon: Iva BoopGessner, Carl E, MD;  [facility];  Service: Endoscopy;  Laterality: N/A;   ESOPHAGOGASTRODUODENOSCOPY (EGD) WITH PROPOFOL N/A 02/04/2017   Procedure: ESOPHAGOGASTRODUODENOSCOPY (EGD) WITH PROPOFOL;  Surgeon: Meryl DareStark, Malcolm T, MD;  [facility];  Service: Endoscopy;  Laterality: N/A;   LAPAROSCOPIC GASTRIC SLEEVE RESECTION  2011   ULNAR NERVE REPAIR Bilateral      OB History   No obstetric history on file.      Home Medications    Prior to Admission medications  Medication Sig Start Date End Date Taking? Authorizing Provider  baclofen (LIORESAL) 10 MG tablet Take 1 tablet (10 mg total) by mouth 3 (three) times daily. Patient not taking: Reported on 03/11/2019 08/26/18   Sater, Nanine Means, MD  busPIRone (BUSPAR) 7.5 MG tablet TAKE 1 TABLET (7.5 MG TOTAL) BY MOUTH 2 (TWO) TIMES DAILY. Patient not taking: Reported on 03/11/2019 02/02/19   Saguier, Percell Miller, PA-C  Cholecalciferol (D2000 ULTRA STRENGTH) 50 MCG (2000 UT) CAPS Take by mouth.    [provider]  DULoxetine (CYMBALTA) 60 MG capsule  Take 1 capsule (60 mg total) by mouth daily. 08/26/18   Sater, Nanine Means, MD  hydrochlorothiazide (HYDRODIURIL) 25 MG tablet TAKE 1 TABLET BY MOUTH EVERY DAY 04/07/19   Saguier, Percell Miller, PA-C  HYDROcodone-acetaminophen (NORCO) 5-325 MG tablet Take 1 tablet by mouth every 6 (six) hours as needed for moderate pain. 02/05/19   Saguier, Percell Miller, PA-C  ibuprofen (ADVIL) 800 MG tablet Take 1 tablet (800 mg total) by mouth every 8 (eight) hours as needed. 03/11/19   Mcarthur Rossetti, MD  lidocaine (LIDODERM) 5 % Place 1 patch onto the skin daily. Remove & Discard patch within 12 hours or as directed by MD 04/12/19   Randal Buba, April, MD  modafinil (PROVIGIL) 200 MG tablet TAKE 1 TABLET BY MOUTH EVERY DAY Patient not taking: No sig reported 01/05/19   Sater, Nanine Means, MD  olmesartan (BENICAR) 5 MG tablet Take 2 tablets (10 mg total) by mouth daily. 03/13/19   Saguier, Percell Miller, PA-C  omeprazole (PRILOSEC) 20 MG capsule Take 2 capsules (40 mg total) by mouth 2 (two) times daily. 11/13/18   Saguier, Percell Miller, PA-C  oxyCODONE (ROXICODONE) 5 MG immediate release tablet Take 1 tablet (5 mg total) by mouth every 4 (four) hours as needed for severe pain. 03/11/19   Mcarthur Rossetti, MD  sucralfate (CARAFATE) 1 g tablet TAKE 1 TABLET (1 G TOTAL) BY MOUTH 4 (FOUR) TIMES DAILY - WITH MEALS AND AT BEDTIME. Patient not taking: Reported on 03/11/2019 02/02/19   Saguier, Percell Miller, PA-C  tiZANidine (ZANAFLEX) 4 MG tablet Take 1 tablet (4 mg total) by mouth every 8 (eight) hours as needed for muscle spasms. 03/11/19   Mcarthur Rossetti, MD  albuterol (PROVENTIL) (2.5 MG/3ML) 0.083% nebulizer solution Take 3 mLs (2.5 mg total) by nebulization every 6 (six) hours as needed for wheezing or shortness of breath. Patient not taking: Reported on 01/29/2019 10/31/18 03/07/19  Saguier, Percell Miller, PA-C  furosemide (LASIX) 20 MG tablet Take 1 tablet (20 mg total) by mouth daily. Patient not taking: Reported on 01/29/2019 01/05/19 03/07/19   Davonna Belling, MD  potassium chloride SA (K-DUR) 20 MEQ tablet Take 1 tablet (20 mEq total) by mouth 2 (two) times daily. Patient not taking: Reported on 01/29/2019 01/05/19 03/07/19  Davonna Belling, MD    Family History Family History  Problem Relation Age of Onset   Cancer Mother    Stroke Mother    Cancer Father     Social History Social History   Tobacco Use   Smoking status: Never Smoker   Smokeless tobacco: Never Used   Tobacco comment: second hand smoker when young. Heavy exposure.  Substance Use Topics   Alcohol use: Yes    Comment: Sts. she drinks beer, liquor daily, varying amts/fim   Drug use: No     Allergies   Ibuprofen and Nsaids   Review of Systems Review of Systems  Respiratory: Positive for cough.   Musculoskeletal:  Chest wall pain  All other systems reviewed and are negative.    Physical Exam Updated Vital Signs BP (!) 144/98    Pulse 90    Temp 97.8 F (36.6 C)    Resp 20    Ht 5\' 5"  (1.651 m)    Wt 68 kg    LMP 08/13/2014 Comment: very few periods   SpO2 95%    BMI 24.96 kg/m   Physical Exam Vitals signs and nursing note reviewed.  Constitutional:      Appearance: She is well-developed.  HENT:     Head: Normocephalic.     Comments: Ecchymosis to chin from recent fall Neck:     Musculoskeletal: Normal range of motion.  Cardiovascular:     Rate and Rhythm: Normal rate and regular rhythm.     Heart sounds: Normal heart sounds.  Pulmonary:     Effort: Pulmonary effort is normal.     Breath sounds: Normal breath sounds.  Chest:    Abdominal:     General: Bowel sounds are normal.     Palpations: Abdomen is soft.  Musculoskeletal: Normal range of motion.     Comments: Ecchymosis to both arms.  L>R  Skin:    General: Skin is warm.     Capillary Refill: Capillary refill takes less than 2 seconds.  Neurological:     General: No focal deficit present.     Mental Status: She is alert and oriented to person, place, and  time.  Psychiatric:        Mood and Affect: Mood normal.        Behavior: Behavior normal.      ED Treatments / Results  Labs (all labs ordered are listed, but only abnormal results are displayed) Labs Reviewed  CBC WITH DIFFERENTIAL/PLATELET - Abnormal; Notable for the following components:      Result Value   RBC 3.64 (*)    Hemoglobin 11.3 (*)    HCT 34.9 (*)    All other components within normal limits  COMPREHENSIVE METABOLIC PANEL - Abnormal; Notable for the following components:   Sodium 134 (*)    Chloride 95 (*)    Total Bilirubin 1.9 (*)    All other components within normal limits  SARS CORONAVIRUS 2 (TAT 6-12 HRS)  URINALYSIS, ROUTINE W REFLEX MICROSCOPIC    EKG None  Radiology Ct Chest W Contrast  Result Date: 04/14/2019 CLINICAL DATA:  Fall, abdominal trauma EXAM: CT CHEST, ABDOMEN, AND PELVIS WITH CONTRAST TECHNIQUE: Multidetector CT imaging of the chest, abdomen and pelvis was performed following the standard protocol during bolus administration of intravenous contrast. CONTRAST:  100mL OMNIPAQUE IOHEXOL 300 MG/ML  SOLN COMPARISON:  CT chest, 11/30/2018, CT abdomen pelvis, 11/01/2018 FINDINGS: CT CHEST FINDINGS Cardiovascular: No significant vascular findings. Mild cardiomegaly no pericardial effusion. Mediastinum/Nodes: No enlarged mediastinal, hilar, or axillary lymph nodes. Small hiatal hernia. Thyroid gland, trachea, and esophagus demonstrate no significant findings. Lungs/Pleura: Small bilateral pleural effusions and associated atelectasis or consolidation Musculoskeletal: No chest wall mass or suspicious bone lesions identified. There are acute, minimally displaced fractures of the anterior left second rib (series 6, image 52) and of the anterior right fourth, fifth, and sixth ribs (series 6, image 91). There are numerous additional subacute and chronic fracture deformities of the bilateral ribs. A previously seen cortical buckle fracture of the manubrium is now  displaced, possibly due to refracture and of uncertain acuity (series 5, image 82). CT ABDOMEN PELVIS FINDINGS Hepatobiliary: No focal liver abnormality  is seen. Status post cholecystectomy. No biliary dilatation. Pancreas: Unremarkable. No pancreatic ductal dilatation or surrounding inflammatory changes. Spleen: Normal in size without significant abnormality. Adrenals/Urinary Tract: Adrenal glands are unremarkable. Kidneys are normal, without renal calculi, solid lesion, or hydronephrosis. Bladder is unremarkable. Stomach/Bowel: Status post partial sleeve gastrectomy. Appendix appears normal. No evidence of bowel wall thickening, distention, or inflammatory changes. Sigmoid diverticulosis. Vascular/Lymphatic: No significant vascular findings are present. No enlarged abdominal or pelvic lymph nodes. Reproductive: 3.9 cm fluid attenuation lesion of the right ovary, likely a functional remnant and unchanged from prior. Other: No abdominal wall hernia or abnormality. No abdominopelvic ascites. Musculoskeletal: There is a nonacute superior endplate deformity of the L1 vertebral body, as seen on prior CT dated 11/30/2018. chronic fracture deformities of the left pubic rami. IMPRESSION: 1. There are numerous bilateral rib fractures of varying acuity, particularly acute fractures of anterior left second and anterior right fourth, fifth, and sixth ribs. No pneumothorax. Small bilateral pleural effusions. 2. A previously seen cortical buckle fracture of the manubrium is now displaced, possibly due to refracture and of uncertain acuity (series 5, image 82). 3. No CT evidence of acute traumatic injury to the organs of the chest, abdomen or pelvis. 4. Chronic, incidental, and postoperative findings as detailed above. Electronically Signed   By: Lauralyn Primes M.D.   On: 04/14/2019 08:59   Ct Abdomen Pelvis W Contrast  Result Date: 04/14/2019 CLINICAL DATA:  Fall, abdominal trauma EXAM: CT CHEST, ABDOMEN, AND PELVIS WITH  CONTRAST TECHNIQUE: Multidetector CT imaging of the chest, abdomen and pelvis was performed following the standard protocol during bolus administration of intravenous contrast. CONTRAST:  OMNIPAQUE IOHEXOL 300 MG/ML  SOLN COMPARISON:  CT chest, 11/30/2018, CT abdomen pelvis, 11/01/2018 FINDINGS: CT CHEST FINDINGS Cardiovascular: No significant vascular findings. Mild cardiomegaly no pericardial effusion. Mediastinum/Nodes: No enlarged mediastinal, hilar, or axillary lymph nodes. Small hiatal hernia. Thyroid gland, trachea, and esophagus demonstrate no significant findings. Lungs/Pleura: Small bilateral pleural effusions and associated atelectasis or consolidation Musculoskeletal: No chest wall mass or suspicious bone lesions identified. There are acute, minimally displaced fractures of the anterior left second rib (series 6, image 52) and of the anterior right fourth, fifth, and sixth ribs (series 6, image 91). There are numerous additional subacute and chronic fracture deformities of the bilateral ribs. A previously seen cortical buckle fracture of the manubrium is now displaced, possibly due to refracture and of uncertain acuity (series 5, image 82). CT ABDOMEN PELVIS FINDINGS Hepatobiliary: No focal liver abnormality is seen. Status post cholecystectomy. No biliary dilatation. Pancreas: Unremarkable. No pancreatic ductal dilatation or surrounding inflammatory changes. Spleen: Normal in size without significant abnormality. Adrenals/Urinary Tract: Adrenal glands are unremarkable. Kidneys are normal, without renal calculi, solid lesion, or hydronephrosis. Bladder is unremarkable. Stomach/Bowel: Status post partial sleeve gastrectomy. Appendix appears normal. No evidence of bowel wall thickening, distention, or inflammatory changes. Sigmoid diverticulosis. Vascular/Lymphatic: No significant vascular findings are present. No enlarged abdominal or pelvic lymph nodes. Reproductive: 3.9 cm fluid attenuation lesion  of the right ovary, likely a functional remnant and unchanged from prior. Other: No abdominal wall hernia or abnormality. No abdominopelvic ascites. Musculoskeletal: There is a nonacute superior endplate deformity of the L1 vertebral body, as seen on prior CT dated 11/30/2018. chronic fracture deformities of the left pubic rami. IMPRESSION: 1. There are numerous bilateral rib fractures of varying acuity, particularly acute fractures of anterior left second and anterior right fourth, fifth, and sixth ribs. No pneumothorax. Small bilateral pleural effusions. 2. A previously  seen cortical buckle fracture of the manubrium is now displaced, possibly due to refracture and of uncertain acuity (series 5, image 82). 3. No CT evidence of acute traumatic injury to the organs of the chest, abdomen or pelvis. 4. Chronic, incidental, and postoperative findings as detailed above. Electronically Signed   By: Lauralyn PrimesAlex  Bibbey M.D.   On: 04/14/2019 08:59    Procedures Procedures (including critical care time)  Medications Ordered in ED Medications  morphine 4 MG/ML injection 4 mg (4 mg Intravenous Given 04/14/19 0732)  ondansetron (ZOFRAN) injection 4 mg (4 mg Intravenous Given 04/14/19 0731)  iohexol (OMNIPAQUE) 300 MG/ML solution 100 mL (100 mLs Intravenous Contrast Given 04/14/19 0740)  morphine 4 MG/ML injection 4 mg (4 mg Intravenous Given 04/14/19 0927)     Initial Impression / Assessment and Plan / ED Course  I have reviewed the triage vital signs and the nursing notes.  Pertinent labs & imaging results that were available during my care of the patient were reviewed by me and considered in my medical decision making (see chart for details).       Pt's CT scans from 8/23 reviewed.  She is still having a lot of pain in her chest.  Pt has required multiple doses of IV medication.  Pt d/w Dr. Leonard SchwartzB. Janee Mornhompson (trauma) [facility]  Final Clinical Impressions(s) / ED Diagnoses   Final diagnoses:  Closed  fracture of multiple ribs of both sides, initial encounter  Closed fracture of manubrium, initial encounter    ED Discharge Orders    None       Jacalyn LefevreHaviland, Minh Jasper, MD 04/14/19 [phone]

## 2019-04-14 NOTE — Telephone Encounter (Signed)
Opened to review 

## 2019-04-14 NOTE — H&P (Addendum)
[facility] Admission Note  Catherine LabrumMary-Jo Hubbard Catherine Hubbard 11/23/1960  [id number].    Requesting MD: Jacalyn LefevreJulie Haviland Chief Complaint/Reason for Consult: Fall  HPI:  Catherine PeppersMary-Jo Hubbard Hubbard is a 58yo female PMH HTN and multiple sclerosis, [place] [facility] for admission to the trauma service after suffering a ground level fall. States that she was at home wearing socks on a hardwood floor 8/22 when she slipped and fell face forward, landing on her chest. No LOC. She was on her way to the ED when she slipped again outside and fell backwards, striking her head on a boulder. No LOC. [facility] head/neck. These were negative. She also sustained an inner lower lip laceration; this was repaired by EDP and she was discharged home.  Due to persistent pain in her chest she [facility] CT chest/ abdomen/ pelvis was obtained and revealed a manubrium fracture as well as multiple bilateral rib fractures (anterior left 2nd and right 4-6). [facility].  Currently complaining of pain in her anterior chest and bilateral ribs. Worse with deep inspiration. At times she feel short of breath. Pulling 1100 on IS. Denies headache, neck pain, back pain, abdominal pain, n/v. She reports significant bruising to her LUE but no pain. She does report pain in her right shoulder. States that she suffered a right proximal humerus fracture about 6 weeks ago. This was treated nonoperatively and is managed by Dr. Magnus IvanBlackman. The pain is no worse than prior to her fall.  Anticoagulants: none Lives at home with her husband Uses walking stick or walker as needed, uses these infrequently  ROS: Review of Systems  Constitutional: Negative.  Negative for chills and fever.  HENT: Positive for hearing loss.   Eyes: Negative.   Respiratory: Positive for shortness of breath. Negative for cough and wheezing.   Cardiovascular: Positive for chest pain and leg  swelling.  Gastrointestinal: Negative.  Negative for abdominal pain, nausea and vomiting.  Genitourinary: Negative.   Musculoskeletal: Positive for joint pain. Negative for back pain and neck pain.  Skin: Negative.   Neurological: Positive for weakness. Negative for dizziness, sensory change, loss of consciousness and headaches.  Endo/Heme/Allergies: Bruises/bleeds easily.   All systems reviewed and otherwise negative except for as above  Family History  Problem Relation Age of Onset   Cancer Mother    Stroke Mother    Cancer Father     Past Medical History:  Diagnosis Date   Alcohol abuse    Asthma    Esophagitis    GERD (gastroesophageal reflux disease)    Headache    Hearing loss    Hypertension    Multiple sclerosis (HCC)    Pancreatitis    Vision abnormalities     Past Surgical History:  Procedure Laterality Date   ANKLE FRACTURE SURGERY Bilateral    CARPAL TUNNEL RELEASE Bilateral    CHOLECYSTECTOMY     COLONOSCOPY  11/2016   multiple   ESOPHAGOGASTRODUODENOSCOPY  11/2016   ESOPHAGOGASTRODUODENOSCOPY N/A 02/01/2017   Procedure: ESOPHAGOGASTRODUODENOSCOPY (EGD);  Surgeon: Iva BoopGessner, Carl E, MD;  [facility];  Service: Endoscopy;  Laterality: N/A;   ESOPHAGOGASTRODUODENOSCOPY (EGD) WITH PROPOFOL N/A 02/04/2017   Procedure: ESOPHAGOGASTRODUODENOSCOPY (EGD) WITH PROPOFOL;  Surgeon: Meryl DareStark, Malcolm T, MD;  [facility];  Service: Endoscopy;  Laterality: N/A;   LAPAROSCOPIC GASTRIC SLEEVE RESECTION  2011   ULNAR NERVE REPAIR Bilateral     Social History:  reports that she  has never smoked. She has never used smokeless tobacco. She reports current alcohol use. She reports that she does not use drugs.  Allergies:  Allergies  Allergen Reactions   Ibuprofen Other (See Comments)    Cannot take due to gastric bypass   Nsaids     Gastric bypass    Medications Prior to Admission  Medication Sig Dispense Refill   baclofen  (LIORESAL) 10 MG tablet Take 1 tablet (10 mg total) by mouth 3 (three) times daily. (Patient not taking: Reported on 03/11/2019) 90 each 05   busPIRone (BUSPAR) 7.5 MG tablet TAKE 1 TABLET (7.5 MG TOTAL) BY MOUTH 2 (TWO) TIMES DAILY. (Patient not taking: Reported on 03/11/2019) 60 tablet 0   Cholecalciferol (D2000 ULTRA STRENGTH) 50 MCG (2000 UT) CAPS Take by mouth.     DULoxetine (CYMBALTA) 60 MG capsule Take 1 capsule (60 mg total) by mouth daily. 30 capsule 11   hydrochlorothiazide (HYDRODIURIL) 25 MG tablet TAKE 1 TABLET BY MOUTH EVERY DAY 30 tablet 0   HYDROcodone-acetaminophen (NORCO) 5-325 MG tablet Take 1 tablet by mouth every 6 (six) hours as needed for moderate pain. 16 tablet 0   ibuprofen (ADVIL) 800 MG tablet Take 1 tablet (800 mg total) by mouth every 8 (eight) hours as needed. 30 tablet 0   lidocaine (LIDODERM) 5 % Place 1 patch onto the skin daily. Remove & Discard patch within 12 hours or as directed by MD 30 patch 0   modafinil (PROVIGIL) 200 MG tablet TAKE 1 TABLET BY MOUTH EVERY DAY (Patient not taking: No sig reported) 30 tablet 5   olmesartan (BENICAR) 5 MG tablet Take 2 tablets (10 mg total) by mouth daily. 60 tablet 1   omeprazole (PRILOSEC) 20 MG capsule Take 2 capsules (40 mg total) by mouth 2 (two) times daily. 60 capsule 2   oxyCODONE (ROXICODONE) 5 MG immediate release tablet Take 1 tablet (5 mg total) by mouth every 4 (four) hours as needed for severe pain. 30 tablet 0   sucralfate (CARAFATE) 1 g tablet TAKE 1 TABLET (1 G TOTAL) BY MOUTH 4 (FOUR) TIMES DAILY - WITH MEALS AND AT BEDTIME. (Patient not taking: Reported on 03/11/2019) 120 tablet 0   tiZANidine (ZANAFLEX) 4 MG tablet Take 1 tablet (4 mg total) by mouth every 8 (eight) hours as needed for muscle spasms. 30 tablet 0    Prior to Admission medications   Medication Sig Start Date End Date Taking? Authorizing Provider  baclofen (LIORESAL) 10 MG tablet Take 1 tablet (10 mg total) by mouth 3 (three) times  daily. Patient not taking: Reported on 03/11/2019 08/26/18   Sater, Pearletha Furlichard A, MD  busPIRone (BUSPAR) 7.5 MG tablet TAKE 1 TABLET (7.5 MG TOTAL) BY MOUTH 2 (TWO) TIMES DAILY. Patient not taking: Reported on 03/11/2019 02/02/19   Saguier, Ramon DredgeEdward, PA-C  Cholecalciferol (D2000 ULTRA STRENGTH) 50 MCG (2000 UT) CAPS Take by mouth.    [provider]  DULoxetine (CYMBALTA) 60 MG capsule Take 1 capsule (60 mg total) by mouth daily. 08/26/18   Sater, Pearletha Furlichard A, MD  hydrochlorothiazide (HYDRODIURIL) 25 MG tablet TAKE 1 TABLET BY MOUTH EVERY DAY 04/07/19   Saguier, Ramon DredgeEdward, PA-C  HYDROcodone-acetaminophen (NORCO) 5-325 MG tablet Take 1 tablet by mouth every 6 (six) hours as needed for moderate pain. 02/05/19   Saguier, Ramon DredgeEdward, PA-C  ibuprofen (ADVIL) 800 MG tablet Take 1 tablet (800 mg total) by mouth every 8 (eight) hours as needed. 03/11/19   Kathryne HitchBlackman, Christopher Y, MD  lidocaine (LIDODERM)  5 % Place 1 patch onto the skin daily. Remove & Discard patch within 12 hours or as directed by MD 04/12/19   Nicanor Alcon, April, MD  modafinil (PROVIGIL) 200 MG tablet TAKE 1 TABLET BY MOUTH EVERY DAY Patient not taking: No sig reported 01/05/19   Sater, Pearletha Furl, MD  olmesartan (BENICAR) 5 MG tablet Take 2 tablets (10 mg total) by mouth daily. 03/13/19   Saguier, Ramon Dredge, PA-C  omeprazole (PRILOSEC) 20 MG capsule Take 2 capsules (40 mg total) by mouth 2 (two) times daily. 11/13/18   Saguier, Ramon Dredge, PA-C  oxyCODONE (ROXICODONE) 5 MG immediate release tablet Take 1 tablet (5 mg total) by mouth every 4 (four) hours as needed for severe pain. 03/11/19   Kathryne Hitch, MD  sucralfate (CARAFATE) 1 g tablet TAKE 1 TABLET (1 G TOTAL) BY MOUTH 4 (FOUR) TIMES DAILY - WITH MEALS AND AT BEDTIME. Patient not taking: Reported on 03/11/2019 02/02/19   Saguier, Ramon Dredge, PA-C  tiZANidine (ZANAFLEX) 4 MG tablet Take 1 tablet (4 mg total) by mouth every 8 (eight) hours as needed for muscle spasms. 03/11/19   Kathryne Hitch, MD    albuterol (PROVENTIL) (2.5 MG/3ML) 0.083% nebulizer solution Take 3 mLs (2.5 mg total) by nebulization every 6 (six) hours as needed for wheezing or shortness of breath. Patient not taking: Reported on 01/29/2019 10/31/18 03/07/19  Saguier, Ramon Dredge, PA-C  furosemide (LASIX) 20 MG tablet Take 1 tablet (20 mg total) by mouth daily. Patient not taking: Reported on 01/29/2019 01/05/19 03/07/19  Benjiman Core, MD  potassium chloride SA (K-DUR) 20 MEQ tablet Take 1 tablet (20 mEq total) by mouth 2 (two) times daily. Patient not taking: Reported on 01/29/2019 01/05/19 03/07/19  Benjiman Core, MD    Blood pressure (!) 148/103, pulse 98, temperature 97.8 F (36.6 C), resp. rate 20, height 5\' 5"  (1.651 m), weight 68 kg, last menstrual period 08/13/2014, SpO2 96 %. Physical Exam: Physical Exam  Constitutional: She is oriented to person, place, and time and well-developed, well-nourished, and in no distress. No distress.  HENT:  Head: Normocephalic and atraumatic.  Right Ear: External ear normal.  Left Ear: External ear normal.  Nose: Nose normal.  Mouth/Throat: Oropharynx is clear and moist.    Diffuse ecchymosis noted on chin Inner lower lip laceration s/p repair with vicryl suture in place  Eyes: Pupils are equal, round, and reactive to light. Conjunctivae and EOM are normal. No scleral icterus.  Neck: Normal range of motion. Neck supple. No tracheal tenderness present. No tracheal deviation present.  Cardiovascular: Normal rate, regular rhythm, normal heart sounds and intact distal pulses.  Pulmonary/Chest: Effort normal and breath sounds normal. No stridor. No respiratory distress. She has no wheezes. She has no rales. She exhibits no tenderness.  Abdominal: Soft. Bowel sounds are normal. She exhibits no distension and no mass. There is no abdominal tenderness. There is no rebound and no guarding.  Musculoskeletal:     Comments: Calves soft and nontender bilaterally with no gross deformity. 1+  edema BLE. No C, T, L spine TTP.  Diffuse ecchymosis noted to left upper arm but there is no bony tenderness or pain with passive arm ROM. Multiple bruises noted to bilateral upper and lower extremities  Neurological: She is alert and oriented to person, place, and time. No cranial nerve deficit. GCS score is 15.  Moving all 4 extremities, no gross sensory or motor deficits BUE/BLE  Skin: Skin is warm and dry. She is not diaphoretic.  Psychiatric: Affect  and judgment normal.  Nursing note and vitals reviewed.    Results for orders placed or performed during the hospital encounter of 04/14/19 (from the past 48 hour(s))  CBC with Differential     Status: Abnormal   Collection Time: 04/14/19  7:27 AM  Result Value Ref Range   WBC 7.7 4.0 - 10.5 K/uL   RBC 3.64 (L) 3.87 - 5.11 MIL/uL   Hemoglobin 11.3 (L) 12.0 - 15.0 g/dL   HCT 34.9 (L) 36.0 - 46.0 %   MCV 95.9 80.0 - 100.0 fL   MCH 31.0 26.0 - 34.0 pg   MCHC 32.4 30.0 - 36.0 g/dL   RDW 13.1 11.5 - 15.5 %   Platelets 242 150 - 400 K/uL   nRBC 0.0 0.0 - 0.2 %   Neutrophils Relative % 73 %   Neutro Abs 5.7 1.7 - 7.7 K/uL   Lymphocytes Relative 11 %   Lymphs Abs 0.9 0.7 - 4.0 K/uL   Monocytes Relative 10 %   Monocytes Absolute 0.8 0.1 - 1.0 K/uL   Eosinophils Relative 5 %   Eosinophils Absolute 0.4 0.0 - 0.5 K/uL   Basophils Relative 1 %   Basophils Absolute 0.1 0.0 - 0.1 K/uL   Immature Granulocytes 0 %   Abs Immature Granulocytes 0.02 0.00 - 0.07 K/uL    Comment: Performed at [facility], [address] [place], [place] [address]  Comprehensive metabolic panel     Status: Abnormal   Collection Time: 04/14/19  7:27 AM  Result Value Ref Range   Sodium 134 (L) 135 - 145 mmol/L   Potassium 3.5 3.5 - 5.1 mmol/L   Chloride 95 (L) 98 - 111 mmol/L   CO2 27 22 - 32 mmol/L   Glucose, Bld 97 70 - 99 mg/dL   BUN 15 6 - 20 mg/dL   Creatinine, Ser 0.55 0.44 - 1.00 mg/dL   Calcium 9.4 8.9 - 10.3 mg/dL   Total Protein 6.9 6.5  - 8.1 g/dL   Albumin 3.9 3.5 - 5.0 g/dL   AST 30 15 - 41 U/L   ALT 17 0 - 44 U/L   Alkaline Phosphatase 94 38 - 126 U/L   Total Bilirubin 1.9 (H) 0.3 - 1.2 mg/dL   GFR calc non Af Amer >60 >60 mL/min   GFR calc Af Amer >60 >60 mL/min   Anion gap 12 5 - 15    Comment: Performed at [facility], [address] [place], [place] [address]   Ct Chest W Contrast  Result Date: 04/14/2019 CLINICAL DATA:  Fall, abdominal trauma EXAM: CT CHEST, ABDOMEN, AND PELVIS WITH CONTRAST TECHNIQUE: Multidetector CT imaging of the chest, abdomen and pelvis was performed following the standard protocol during bolus administration of intravenous contrast. CONTRAST:  160mL OMNIPAQUE IOHEXOL 300 MG/ML  SOLN COMPARISON:  CT chest, 11/30/2018, CT abdomen pelvis, 11/01/2018 FINDINGS: CT CHEST FINDINGS Cardiovascular: No significant vascular findings. Mild cardiomegaly no pericardial effusion. Mediastinum/Nodes: No enlarged mediastinal, hilar, or axillary lymph nodes. Small hiatal hernia. Thyroid gland, trachea, and esophagus demonstrate no significant findings. Lungs/Pleura: Small bilateral pleural effusions and associated atelectasis or consolidation Musculoskeletal: No chest wall mass or suspicious bone lesions identified. There are acute, minimally displaced fractures of the anterior left second rib (series 6, image 52) and of the anterior right fourth, fifth, and sixth ribs (series 6, image 91). There are numerous additional subacute and chronic fracture deformities of the bilateral ribs. A previously seen cortical  buckle fracture of the manubrium is now displaced, possibly due to refracture and of uncertain acuity (series 5, image 82). CT ABDOMEN PELVIS FINDINGS Hepatobiliary: No focal liver abnormality is seen. Status post cholecystectomy. No biliary dilatation. Pancreas: Unremarkable. No pancreatic ductal dilatation or surrounding inflammatory changes. Spleen: Normal in size without significant abnormality.  Adrenals/Urinary Tract: Adrenal glands are unremarkable. Kidneys are normal, without renal calculi, solid lesion, or hydronephrosis. Bladder is unremarkable. Stomach/Bowel: Status post partial sleeve gastrectomy. Appendix appears normal. No evidence of bowel wall thickening, distention, or inflammatory changes. Sigmoid diverticulosis. Vascular/Lymphatic: No significant vascular findings are present. No enlarged abdominal or pelvic lymph nodes. Reproductive: 3.9 cm fluid attenuation lesion of the right ovary, likely a functional remnant and unchanged from prior. Other: No abdominal wall hernia or abnormality. No abdominopelvic ascites. Musculoskeletal: There is a nonacute superior endplate deformity of the L1 vertebral body, as seen on prior CT dated 11/30/2018. chronic fracture deformities of the left pubic rami. IMPRESSION: 1. There are numerous bilateral rib fractures of varying acuity, particularly acute fractures of anterior left second and anterior right fourth, fifth, and sixth ribs. No pneumothorax. Small bilateral pleural effusions. 2. A previously seen cortical buckle fracture of the manubrium is now displaced, possibly due to refracture and of uncertain acuity (series 5, image 82). 3. No CT evidence of acute traumatic injury to the organs of the chest, abdomen or pelvis. 4. Chronic, incidental, and postoperative findings as detailed above. Electronically Signed   By: Lauralyn PrimesAlex  Bibbey M.D.   On: 04/14/2019 08:59   Ct Abdomen Pelvis W Contrast  Result Date: 04/14/2019 CLINICAL DATA:  Fall, abdominal trauma EXAM: CT CHEST, ABDOMEN, AND PELVIS WITH CONTRAST TECHNIQUE: Multidetector CT imaging of the chest, abdomen and pelvis was performed following the standard protocol during bolus administration of intravenous contrast. CONTRAST:  100mL OMNIPAQUE IOHEXOL 300 MG/ML  SOLN COMPARISON:  CT chest, 11/30/2018, CT abdomen pelvis, 11/01/2018 FINDINGS: CT CHEST FINDINGS Cardiovascular: No significant vascular  findings. Mild cardiomegaly no pericardial effusion. Mediastinum/Nodes: No enlarged mediastinal, hilar, or axillary lymph nodes. Small hiatal hernia. Thyroid gland, trachea, and esophagus demonstrate no significant findings. Lungs/Pleura: Small bilateral pleural effusions and associated atelectasis or consolidation Musculoskeletal: No chest wall mass or suspicious bone lesions identified. There are acute, minimally displaced fractures of the anterior left second rib (series 6, image 52) and of the anterior right fourth, fifth, and sixth ribs (series 6, image 91). There are numerous additional subacute and chronic fracture deformities of the bilateral ribs. A previously seen cortical buckle fracture of the manubrium is now displaced, possibly due to refracture and of uncertain acuity (series 5, image 82). CT ABDOMEN PELVIS FINDINGS Hepatobiliary: No focal liver abnormality is seen. Status post cholecystectomy. No biliary dilatation. Pancreas: Unremarkable. No pancreatic ductal dilatation or surrounding inflammatory changes. Spleen: Normal in size without significant abnormality. Adrenals/Urinary Tract: Adrenal glands are unremarkable. Kidneys are normal, without renal calculi, solid lesion, or hydronephrosis. Bladder is unremarkable. Stomach/Bowel: Status post partial sleeve gastrectomy. Appendix appears normal. No evidence of bowel wall thickening, distention, or inflammatory changes. Sigmoid diverticulosis. Vascular/Lymphatic: No significant vascular findings are present. No enlarged abdominal or pelvic lymph nodes. Reproductive: 3.9 cm fluid attenuation lesion of the right ovary, likely a functional remnant and unchanged from prior. Other: No abdominal wall hernia or abnormality. No abdominopelvic ascites. Musculoskeletal: There is a nonacute superior endplate deformity of the L1 vertebral body, as seen on prior CT dated 11/30/2018. chronic fracture deformities of the left pubic rami. IMPRESSION: 1. There are  numerous  bilateral rib fractures of varying acuity, particularly acute fractures of anterior left second and anterior right fourth, fifth, and sixth ribs. No pneumothorax. Small bilateral pleural effusions. 2. A previously seen cortical buckle fracture of the manubrium is now displaced, possibly due to refracture and of uncertain acuity (series 5, image 82). 3. No CT evidence of acute traumatic injury to the organs of the chest, abdomen or pelvis. 4. Chronic, incidental, and postoperative findings as detailed above. Electronically Signed   By: Lauralyn Primes M.D.   On: 04/14/2019 08:59      Assessment/Plan Fall 04/11/2019 Anterior L 2nd and R 4-6 rib fxs - pain control and pulmonary toilet Manubrium fx - pain control Inner lip lac - s/p repair with vicryl sutures by EDP 8/23 Recent R proximal humerus fx - about 6 weeks out, treated nonop by Dr. Magnus Ivan. Last visit 8/12 he recommended continue sling PRN, avoid overhead activities at least 2 more weeks Multiple sclerosis HTN - home meds GERD - protonix Asthma - uses albuterol rarely at home  ID - none VTE - SCDs, lovenox FEN - IVF, HH diet Foley - none Follow up - TBD  Plan - Admit to med-surg for observation, pain control and pulmonary toilet. PT/OT. Covid pending  Franne Forts, [facility] 04/14/2019, 12:51 PM Pager: [phone] Mon-Thurs 7:00 am-4:30 pm Fri 7:00 am -11:30 AM Sat-Sun 7:00 am-11:30 am

## 2019-04-14 NOTE — ED Notes (Signed)
ED Provider at bedside. 

## 2019-04-15 DIAGNOSIS — S01511A Laceration without foreign body of lip, initial encounter: Secondary | ICD-10-CM | POA: Diagnosis present

## 2019-04-15 DIAGNOSIS — S2243XA Multiple fractures of ribs, bilateral, initial encounter for closed fracture: Secondary | ICD-10-CM | POA: Diagnosis not present

## 2019-04-15 DIAGNOSIS — G35 Multiple sclerosis: Secondary | ICD-10-CM | POA: Diagnosis not present

## 2019-04-15 DIAGNOSIS — S42201D Unspecified fracture of upper end of right humerus, subsequent encounter for fracture with routine healing: Secondary | ICD-10-CM | POA: Diagnosis not present

## 2019-04-15 DIAGNOSIS — R296 Repeated falls: Secondary | ICD-10-CM | POA: Diagnosis present

## 2019-04-15 DIAGNOSIS — K219 Gastro-esophageal reflux disease without esophagitis: Secondary | ICD-10-CM | POA: Diagnosis present

## 2019-04-15 DIAGNOSIS — W010XXA Fall on same level from slipping, tripping and stumbling without subsequent striking against object, initial encounter: Secondary | ICD-10-CM | POA: Diagnosis present

## 2019-04-15 DIAGNOSIS — Z823 Family history of stroke: Secondary | ICD-10-CM | POA: Diagnosis not present

## 2019-04-15 DIAGNOSIS — Z20828 Contact with and (suspected) exposure to other viral communicable diseases: Secondary | ICD-10-CM | POA: Diagnosis present

## 2019-04-15 DIAGNOSIS — S2221XA Fracture of manubrium, initial encounter for closed fracture: Secondary | ICD-10-CM | POA: Diagnosis not present

## 2019-04-15 DIAGNOSIS — I10 Essential (primary) hypertension: Secondary | ICD-10-CM | POA: Diagnosis not present

## 2019-04-15 DIAGNOSIS — J45909 Unspecified asthma, uncomplicated: Secondary | ICD-10-CM | POA: Diagnosis present

## 2019-04-15 LAB — HIV ANTIBODY (ROUTINE TESTING W REFLEX): HIV Screen 4th Generation wRfx: NONREACTIVE

## 2019-04-15 NOTE — Progress Notes (Addendum)
[facility] Progress Note     Subjective: CC-  Difficulty sleeping last night due to being in the hospital and pain. Continues to have pain in manubrium and bilateral ribs. Worse with deep inspiration. Currently O2 sats 100% on 3L [place]. Pulling 2000 on IS. Tolerating diet. Denies abdominal pain, n/v. Last BM 8/24.  Objective: Vital signs in last 24 hours: Temp:  [97.7 F (36.5 C)-98.7 F (37.1 C)] 97.9 F (36.6 C) (08/26 0542) Pulse Rate:  [85-104] 85 (08/26 0542) Resp:  [16-20] 17 (08/26 0542) BP: (108-148)/(73-103) 113/76 (08/26 0542) SpO2:  [95 %-98 %] 98 % (08/26 0542)    Intake/Output from previous day: 08/25 0701 - 08/26 0700 In: 886.5 [P.O.:240; I.V.:646.5] Out: -  Intake/Output this shift: No intake/output data recorded.  PE: Gen:  Alert, NAD, pleasant HEENT: EOM's intact, pupils equal and round. Diffuse ecchymosis on chin Card:  RRR Pulm:  CTAB, no W/R/R, effort normal on 3L, pulling 2000 on IS Abd: Soft, NT/ND, +BS Ext:  Calves soft and nontender, 1+ edema BLE. Significant ecchymosis noted to left upper arm Psych: A&Ox3  Neuro: moving all 4 extremities Skin: no rashes noted, warm and dry  Lab Results:  Recent Labs    04/14/19 0727  WBC 7.7  HGB 11.3*  HCT 34.9*  PLT 242   BMET Recent Labs    04/14/19 0727  NA 134*  K 3.5  CL 95*  CO2 27  GLUCOSE 97  BUN 15  CREATININE 0.55  CALCIUM 9.4   PT/INR No results for input(s): LABPROT, INR in the last 72 hours. CMP     Component Value Date/Time   NA 134 (L) 04/14/2019 0727   NA 135 08/26/2018 0959   K 3.5 04/14/2019 0727   CL 95 (L) 04/14/2019 0727   CO2 27 04/14/2019 0727   GLUCOSE 97 04/14/2019 0727   BUN 15 04/14/2019 0727   BUN 20 08/26/2018 0959   CREATININE 0.55 04/14/2019 0727   CREATININE 0.62 11/27/2018 1505   CALCIUM 9.4 04/14/2019 0727   PROT 6.9 04/14/2019 0727   PROT 6.4 08/26/2018 0959   ALBUMIN 3.9 04/14/2019 0727   ALBUMIN 3.8 08/26/2018 0959   AST 30  04/14/2019 0727   ALT 17 04/14/2019 0727   ALKPHOS 94 04/14/2019 0727   BILITOT 1.9 (H) 04/14/2019 0727   BILITOT 1.8 (H) 08/26/2018 0959   GFRNONAA >60 04/14/2019 0727   GFRAA >60 04/14/2019 0727   Lipase     Component Value Date/Time   LIPASE 117 (H) 01/06/2019 0235       Studies/Results: Ct Chest W Contrast  Result Date: 04/14/2019 CLINICAL DATA:  Fall, abdominal trauma EXAM: CT CHEST, ABDOMEN, AND PELVIS WITH CONTRAST TECHNIQUE: Multidetector CT imaging of the chest, abdomen and pelvis was performed following the standard protocol during bolus administration of intravenous contrast. CONTRAST:  100mL OMNIPAQUE IOHEXOL 300 MG/ML  SOLN COMPARISON:  CT chest, 11/30/2018, CT abdomen pelvis, 11/01/2018 FINDINGS: CT CHEST FINDINGS Cardiovascular: No significant vascular findings. Mild cardiomegaly no pericardial effusion. Mediastinum/Nodes: No enlarged mediastinal, hilar, or axillary lymph nodes. Small hiatal hernia. Thyroid gland, trachea, and esophagus demonstrate no significant findings. Lungs/Pleura: Small bilateral pleural effusions and associated atelectasis or consolidation Musculoskeletal: No chest wall mass or suspicious bone lesions identified. There are acute, minimally displaced fractures of the anterior left second rib (series 6, image 52) and of the anterior right fourth, fifth, and sixth ribs (series 6, image 91). There are numerous additional subacute and chronic fracture deformities of the  bilateral ribs. A previously seen cortical buckle fracture of the manubrium is now displaced, possibly due to refracture and of uncertain acuity (series 5, image 82). CT ABDOMEN PELVIS FINDINGS Hepatobiliary: No focal liver abnormality is seen. Status post cholecystectomy. No biliary dilatation. Pancreas: Unremarkable. No pancreatic ductal dilatation or surrounding inflammatory changes. Spleen: Normal in size without significant abnormality. Adrenals/Urinary Tract: Adrenal glands are unremarkable.  Kidneys are normal, without renal calculi, solid lesion, or hydronephrosis. Bladder is unremarkable. Stomach/Bowel: Status post partial sleeve gastrectomy. Appendix appears normal. No evidence of bowel wall thickening, distention, or inflammatory changes. Sigmoid diverticulosis. Vascular/Lymphatic: No significant vascular findings are present. No enlarged abdominal or pelvic lymph nodes. Reproductive: 3.9 cm fluid attenuation lesion of the right ovary, likely a functional remnant and unchanged from prior. Other: No abdominal wall hernia or abnormality. No abdominopelvic ascites. Musculoskeletal: There is a nonacute superior endplate deformity of the L1 vertebral body, as seen on prior CT dated 11/30/2018. chronic fracture deformities of the left pubic rami. IMPRESSION: 1. There are numerous bilateral rib fractures of varying acuity, particularly acute fractures of anterior left second and anterior right fourth, fifth, and sixth ribs. No pneumothorax. Small bilateral pleural effusions. 2. A previously seen cortical buckle fracture of the manubrium is now displaced, possibly due to refracture and of uncertain acuity (series 5, image 82). 3. No CT evidence of acute traumatic injury to the organs of the chest, abdomen or pelvis. 4. Chronic, incidental, and postoperative findings as detailed above. Electronically Signed   By: Lauralyn PrimesAlex  Bibbey M.D.   On: 04/14/2019 08:59   Ct Abdomen Pelvis W Contrast  Result Date: 04/14/2019 CLINICAL DATA:  Fall, abdominal trauma EXAM: CT CHEST, ABDOMEN, AND PELVIS WITH CONTRAST TECHNIQUE: Multidetector CT imaging of the chest, abdomen and pelvis was performed following the standard protocol during bolus administration of intravenous contrast. CONTRAST:  100mL OMNIPAQUE IOHEXOL 300 MG/ML  SOLN COMPARISON:  CT chest, 11/30/2018, CT abdomen pelvis, 11/01/2018 FINDINGS: CT CHEST FINDINGS Cardiovascular: No significant vascular findings. Mild cardiomegaly no pericardial effusion.  Mediastinum/Nodes: No enlarged mediastinal, hilar, or axillary lymph nodes. Small hiatal hernia. Thyroid gland, trachea, and esophagus demonstrate no significant findings. Lungs/Pleura: Small bilateral pleural effusions and associated atelectasis or consolidation Musculoskeletal: No chest wall mass or suspicious bone lesions identified. There are acute, minimally displaced fractures of the anterior left second rib (series 6, image 52) and of the anterior right fourth, fifth, and sixth ribs (series 6, image 91). There are numerous additional subacute and chronic fracture deformities of the bilateral ribs. A previously seen cortical buckle fracture of the manubrium is now displaced, possibly due to refracture and of uncertain acuity (series 5, image 82). CT ABDOMEN PELVIS FINDINGS Hepatobiliary: No focal liver abnormality is seen. Status post cholecystectomy. No biliary dilatation. Pancreas: Unremarkable. No pancreatic ductal dilatation or surrounding inflammatory changes. Spleen: Normal in size without significant abnormality. Adrenals/Urinary Tract: Adrenal glands are unremarkable. Kidneys are normal, without renal calculi, solid lesion, or hydronephrosis. Bladder is unremarkable. Stomach/Bowel: Status post partial sleeve gastrectomy. Appendix appears normal. No evidence of bowel wall thickening, distention, or inflammatory changes. Sigmoid diverticulosis. Vascular/Lymphatic: No significant vascular findings are present. No enlarged abdominal or pelvic lymph nodes. Reproductive: 3.9 cm fluid attenuation lesion of the right ovary, likely a functional remnant and unchanged from prior. Other: No abdominal wall hernia or abnormality. No abdominopelvic ascites. Musculoskeletal: There is a nonacute superior endplate deformity of the L1 vertebral body, as seen on prior CT dated 11/30/2018. chronic fracture deformities of the left pubic rami.  IMPRESSION: 1. There are numerous bilateral rib fractures of varying acuity,  particularly acute fractures of anterior left second and anterior right fourth, fifth, and sixth ribs. No pneumothorax. Small bilateral pleural effusions. 2. A previously seen cortical buckle fracture of the manubrium is now displaced, possibly due to refracture and of uncertain acuity (series 5, image 82). 3. No CT evidence of acute traumatic injury to the organs of the chest, abdomen or pelvis. 4. Chronic, incidental, and postoperative findings as detailed above. Electronically Signed   By: Eddie Candle M.D.   On: 04/14/2019 08:59    Anti-infectives: Anti-infectives (From admission, onward)   None       Assessment/Plan Fall 04/11/2019 Anterior L 2nd and R 4-6 rib fxs - pain control and pulmonary toilet Manubrium fx - pain control Inner lip lac - s/p repair with vicryl sutures by EDP 8/23 Recent R proximal humerus fx - about 6 weeks out, treated nonop by Dr. Ninfa Linden. Last visit 8/12 he recommended continue sling PRN, avoid overhead activities at least 2 more weeks Multiple sclerosis HTN - home meds GERD - protonix Asthma - uses albuterol rarely at home. Will add duonebs PRN wheezing  ID - none VTE - SCDs, lovenox FEN - d/c IVF, HH diet Foley - none Follow up - TBD  Plan - PT/OT evaluations today. Continue pain control and pulmonary toilet. Wean patient to room air.   LOS: 1 day    Catherine Hubbard , [facility] 04/15/2019, 7:53 AM Pager: [phone] Mon-Thurs 7:00 am-4:30 pm Fri 7:00 am -11:30 AM Sat-Sun 7:00 am-11:30 am

## 2019-04-15 NOTE — Evaluation (Signed)
Occupational Therapy Evaluation and Discharge Patient Details Name: Catherine Hubbard MRN: [id number] DOB: 1960/10/21 Today's Date: 04/15/2019    History of Present Illness Pt is a 58 y.o. female admitted 04/14/19 continued chest pain after recent falls (04/12/19; at that time imaging crevical spine and head negative for acute abnormality). Pt found to have anterior L 2nd and R 4-6 rib fxs, manubrium fx. Of note, pt also with recent R proximal humerus fx (~6 wks ago, nonoperative). PMH includes multiple sclerosis, HTN, asthma.   Clinical Impression   Pt is functioning at supervision level for safety. Recommended walker use, tub transfer and installation of rail at front door. Pt receptive. No further OT needs.    Follow Up Recommendations  No OT follow up    Equipment Recommendations  None recommended by OT    Recommendations for Other Services       Precautions / Restrictions Precautions Precautions: Fall;Shoulder Type of Shoulder Precautions: no overhead use, sling for comfort--R shoulder Shoulder Interventions: Shoulder sling/immobilizer;For comfort Precaution Booklet Issued: No Precaution Comments: Sternal precautions for comfort (manubrium/rib fxs) Restrictions Weight Bearing Restrictions: No Other Position/Activity Restrictions: Recent RUE fx, reportedly only order is no overhead lifting/movement      Mobility Bed Mobility               General bed mobility comments: Received seated in recliner  Transfers Overall transfer level: Modified independent Equipment used: None;Rolling walker (2 wheeled)                  Balance Overall balance assessment: Needs assistance   Sitting balance-Leahy Scale: Good       Standing balance-Leahy Scale: Fair Standing balance comment: fair static, poor dynamic                           ADL either performed or assessed with clinical judgement   ADL                                          General ADL Comments: overall functioning at a supervision level in ADL with RW, recommended tub transfer bench and installation of railing to outside door     Vision Baseline Vision/History: Wears glasses Wears Glasses: Reading only Patient Visual Report: No change from baseline       Perception     Praxis      Pertinent Vitals/Pain Pain Assessment: Faces Faces Pain Scale: Hurts little more Pain Location: R shoulder, R-side ribs Pain Descriptors / Indicators: Sore;Guarding Pain Intervention(s): Monitored during session;Repositioned;Premedicated before session     Hand Dominance Right   Extremity/Trunk Assessment Upper Extremity Assessment Upper Extremity Assessment: RUE deficits/detail RUE Deficits / Details: s/p recent humeral fx RUE: Unable to fully assess due to immobilization RUE Coordination: decreased gross motor   Lower Extremity Assessment Lower Extremity Assessment: Defer to PT evaluation   Cervical / Trunk Assessment Cervical / Trunk Assessment: Normal   Communication Communication Communication: HOH;Other (comment)(with B aids)   Cognition Arousal/Alertness: Awake/alert Behavior During Therapy: WFL for tasks assessed/performed Overall Cognitive Status: Within Functional Limits for tasks assessed                                     General Comments  Resting SpO2 93% on RA, HR 103;  ambulatory SpO2 95% on RA, HR 138    Exercises     Shoulder Instructions      Home Living Family/patient expects to be discharged to:: Private residence Living Arrangements: Spouse/significant other Available Help at Discharge: Family;Available 24 hours/day Type of Home: House Home Access: Stairs to enter Entergy CorporationEntrance Stairs-Number of Steps: 1+1 Entrance Stairs-Rails: None Home Layout: One level     Bathroom Shower/Tub: Chief Strategy OfficerTub/shower unit   Bathroom Toilet: Standard(with 3 in 1 over)     Home Equipment: Environmental consultantWalker - 2 wheels;Bedside commode;Other  (comment);Walker - 4 wheels;Hand held shower head(walking stick)   Additional Comments: Readers for vision      Prior Functioning/Environment Level of Independence: Independent        Comments: Indep to stand in shower. Community ambulator without DME, drives; does majority of cooking, cleaning, errands. H/o MS and multiple falls        OT Problem List:        OT Treatment/Interventions:      OT Goals(Current goals can be found in the care plan section) Acute Rehab OT Goals Patient Stated Goal: Rail installed on steps into home  OT Frequency:     Barriers to D/C:            Co-evaluation   Reason for Co-Treatment: For patient/therapist safety;To address functional/ADL transfers PT goals addressed during session: Mobility/safety with mobility;Balance;Proper use of DME        AM-PAC OT "6 Clicks" Daily Activity     Outcome Measure Help from another person eating meals?: None Help from another person taking care of personal grooming?: None Help from another person toileting, which includes using toliet, bedpan, or urinal?: None Help from another person bathing (including washing, rinsing, drying)?: None Help from another person to put on and taking off regular upper body clothing?: None Help from another person to put on and taking off regular lower body clothing?: None 6 Click Score: 24   End of Session Equipment Utilized During Treatment: Gait belt;Rolling walker  Activity Tolerance: Patient tolerated treatment well Patient left: in chair;with call bell/phone within reach;with chair alarm set  OT Visit Diagnosis: Other abnormalities of gait and mobility (R26.89);Pain;History of falling (Z91.81)                Time: [id number] OT Time Calculation (min): 29 min Charges:  OT General Charges $OT Visit: 1 Visit OT Evaluation $OT Eval Moderate Complexity: 1 Mod  Catherine RoundJulie Hailea Hubbard, OTR/L Acute Rehabilitation Services Pager: [phone] Office:  [phone]  Catherine Hubbard, Catherine Hubbard 04/15/2019, 1:23 PM

## 2019-04-15 NOTE — Evaluation (Signed)
Physical Therapy Evaluation Patient Details Name: Catherine Hubbard MRN: [id number] DOB: 1961-07-07 Today's Date: 04/15/2019   History of Present Illness  Pt is a 58 y.o. female admitted 04/14/19 continued chest pain after recent falls (04/12/19; at that time imaging crevical spine and head negative for acute abnormality). Pt found to have anterior L 2nd and R 4-6 rib fxs, manubrium fx. Of note, pt also with recent R proximal humerus fx (~6 wks ago, nonoperative). PMH includes multiple sclerosis, HTN, asthma.    Clinical Impression  Pt presents with an overall decrease in functional mobility secondary to above. PTA, pt independent, drives and lives with husband. Today, pt ambulating well with RW at supervision-level. Educ re: fall risk reduction, DME recommendations, importance of mobility. Pt endorses multiple falls, many of which occur on steps into home (have reached out to Education officer, museum for potential resources for Chief Financial Officer). Also discussed recommendation for outpatient PT follow-up to address higher level balance deficits and pt's progressive neuromuscular disorder; pt reports she has not been able to afford this in the past. Pt would benefit from continued acute PT services to maximize functional mobility and independence prior to d/c home.     Follow Up Recommendations Outpatient PT;Supervision for mobility/OOB(has been recommended outpatient PT before, pt worried about expense)    Equipment Recommendations  None recommended by PT    Recommendations for Other Services       Precautions / Restrictions Precautions Precautions: Fall Precaution Comments: Sternal precautions for comfort (manubrium/rib fxs) Restrictions Other Position/Activity Restrictions: Recent RUE fx, reportedly only order is no overhead lifting/movement      Mobility  Bed Mobility               General bed mobility comments: Received seated in recliner  Transfers Overall transfer level:  Modified independent Equipment used: None;Rolling walker (2 wheeled)                Ambulation/Gait Ambulation/Gait assistance: Supervision Gait Distance (Feet): 250 Feet Assistive device: Rolling walker (2 wheeled) Gait Pattern/deviations: Step-through pattern;Decreased stride length   Gait velocity interpretation: 1.31 - 2.62 ft/sec, indicative of limited community ambulator General Gait Details: Pt initially planning to walk without DME, but prefers to use RW due to feeling "off" with pain meds. Slow, steady gait with RW at supervision-level due to fall risk; good safety awareness with RW  Stairs            Wheelchair Mobility    Modified Rankin (Stroke Patients Only)       Balance Overall balance assessment: Needs assistance   Sitting balance-Leahy Scale: Good       Standing balance-Leahy Scale: Fair                               Pertinent Vitals/Pain Pain Assessment: Faces Faces Pain Scale: Hurts little more Pain Location: R shoulder, R-side ribs Pain Descriptors / Indicators: Sore;Guarding Pain Intervention(s): Monitored during session    Home Living Family/patient expects to be discharged to:: Private residence Living Arrangements: Spouse/significant other Available Help at Discharge: Family;Available 24 hours/day Type of Home: House Home Access: Stairs to enter Entrance Stairs-Rails: None Entrance Stairs-Number of Steps: 1+1 Home Layout: One level Home Equipment: Walker - 2 wheels;Bedside commode;Other (comment);Walker - 4 wheels(walking stick) Additional Comments: Readers for vision    Prior Function Level of Independence: Independent         Comments: Indep to stand in shower. Community ambulator  without DME, drives; does majority of cooking, cleaning, errands. H/o MS and multiple falls     Hand Dominance   Dominant Hand: Right    Extremity/Trunk Assessment   Upper Extremity Assessment Upper Extremity Assessment: RUE  deficits/detail RUE Deficits / Details: s/p recent humeral fx    Lower Extremity Assessment Lower Extremity Assessment: Generalized weakness       Communication   Communication: No difficulties  Cognition Arousal/Alertness: Awake/alert Behavior During Therapy: WFL for tasks assessed/performed Overall Cognitive Status: Within Functional Limits for tasks assessed                                        General Comments General comments (skin integrity, edema, etc.): Resting SpO2 93% on RA, HR 103; ambulatory SpO2 95% on RA, HR 138    Exercises     Assessment/Plan    PT Assessment Patient needs continued PT services  PT Problem List Decreased strength;Decreased activity tolerance;Decreased balance;Decreased mobility;Decreased knowledge of use of DME       PT Treatment Interventions DME instruction;Gait training;Stair training;Functional mobility training;Therapeutic activities;Therapeutic exercise;Balance training;Patient/family education    PT Goals (Current goals can be found in the Care Plan section)  Acute Rehab PT Goals Patient Stated Goal: Rail installed on steps into home PT Goal Formulation: With patient Time For Goal Achievement: 04/29/19 Potential to Achieve Goals: Good    Frequency Min 3X/week   Barriers to discharge        Co-evaluation PT/OT/SLP Co-Evaluation/Treatment: Yes Reason for Co-Treatment: For patient/therapist safety;To address functional/ADL transfers PT goals addressed during session: Mobility/safety with mobility;Balance;Proper use of DME         AM-PAC PT "6 Clicks" Mobility  Outcome Measure Help needed turning from your back to your side while in a flat bed without using bedrails?: None Help needed moving from lying on your back to sitting on the side of a flat bed without using bedrails?: None Help needed moving to and from a bed to a chair (including a wheelchair)?: None Help needed standing up from a chair using your  arms (e.g., wheelchair or bedside chair)?: A Little Help needed to walk in hospital room?: A Little Help needed climbing 3-5 steps with a railing? : A Little 6 Click Score: 21    End of Session Equipment Utilized During Treatment: Gait belt Activity Tolerance: Patient tolerated treatment well Patient left: in chair;with call bell/phone within reach;with chair alarm set Nurse Communication: Mobility status PT Visit Diagnosis: Other abnormalities of gait and mobility (R26.89);Repeated falls (R29.6)    Time: [id number] PT Time Calculation (min) (ACUTE ONLY): 30 min   Charges:   PT Evaluation $PT Eval Moderate Complexity: 1 Mod     Ina HomesJaclyn Tracina Beaumont, PT, DPT Acute Rehabilitation Services  Pager [phone] Office [phone]  Malachy ChamberJaclyn L Evona Westra 04/15/2019, 12:52 PM

## 2019-04-16 MED ORDER — MAGNESIUM CITRATE PO SOLN: 1.0000 | Freq: Once | ORAL | Status: DC | PRN

## 2019-04-16 NOTE — Discharge Instructions (Signed)
Rib Fracture ° °A rib fracture is a break or crack in one of the bones of the ribs. The ribs are like a cage that goes around your upper chest. A broken or cracked rib is often painful, but most do not cause other problems. Most rib fractures usually heal on their own in 1-3 months. °Follow these instructions at home: °Managing pain, stiffness, and swelling °· If directed, apply ice to the injured area. °? Put ice in a plastic bag. °? Place a towel between your skin and the bag. °? Leave the ice on for 20 minutes, 2-3 times a day. °· Take over-the-counter and prescription medicines only as told by your doctor. °Activity °· Avoid activities that cause pain to the injured area. Protect your injured area. °· Slowly increase activity as told by your doctor. °General instructions °· Do deep breathing as told by your doctor. You may be told to: °? Take deep breaths many times a day. °? Cough many times a day while hugging a pillow. °? Use a device (incentive spirometer) to do deep breathing many times a day. °· Drink enough fluid to keep your pee (urine) clear or pale yellow. °· Do not wear a rib belt or binder. These do not allow you to breathe deeply. °· Keep all follow-up visits as told by your doctor. This is important. °Contact a doctor if: °· You have a fever. °Get help right away if: °· You have trouble breathing. °· You are short of breath. °· You cannot stop coughing. °· You cough up thick or bloody spit (sputum). °· You feel sick to your stomach (nauseous), throw up (vomit), or have belly (abdominal) pain. °· Your pain gets worse and medicine does not help. °Summary °· A rib fracture is a break or crack in one of the bones of the ribs. °· Apply ice to the injured area and take medicines for pain as told by your doctor. °· Take deep breaths and cough many times a day. Hug a pillow every time you cough. °This information is not intended to replace advice given to you by your health care provider. Make sure you  discuss any questions you have with your health care provider. °Document Released: 05/15/2008 Document Revised: 07/19/2017 Document Reviewed: 11/06/2016 °Elsevier Patient Education © 2020 [employer] ° °

## 2019-04-16 NOTE — Consult Note (Signed)
   [facility] CM Inpatient Consult   04/16/2019  Catherine Hubbard [birth year] [id number]    Patient screened forhighrisk score of24% for unplanned readmissionwith 2hospitalizations and 9 ED visits in the past 6 months, andto check if potential [facility] services are needed [employer] benefits.  Per MD's history and physical on8/25/20 reveals as follows:    Catherine Hubbard is a 58yo female PMH HTN and multiple sclerosis, who was transferred from [facility] to [facility] for admission to the trauma service after suffering a ground level fall. Fell at home wearing socks on a hardwood floor 8/22; was on her way to the ED when she slipped again outside and fell backwards, striking her head on a boulder. CT head/neck were negative. She also sustained an inner lower lip laceration; this was repaired by EDP and she was discharged home.  Due to persistent pain in her chest she returned to [facility]. CT chest/ abdomen/ pelvis was obtained and revealed a manubrium fracture as well as multiple bilateral rib fractures (anterior left 2nd and right 4-6). Transferred to [facility] for admission.  Called to speak with patient to identify possible discharge needs and was able to speak with her briefly. HIPAA obtained. She denies any needs with medications (self managed), pharmacy (uses [facility], [place]); able to drive, has help at home when needed. Phone call was cut during conversation and several attempts to call back patient (w/ nurses' station attempts to reconnect) but was unsuccessful.  Review of medical record and PT note show that patient is independent, drives, and lives with husband at home prior to admission. Therapy recommending Outpatient PT to address higher level balance deficits and progressive neuromuscular disorder due to multiple falls; and has provided education re: fall risk reduction, DME recommendations, importance of mobility.    [place]  [place] with [employer] [facility] [employer], listed as providing transition of care follow-up.  Patient may benefit from [facility] calls. Will follow with [facility] calls-up to check for continued recoveryafter discharge.  Patient will likely transition to home with Outpatient PT Rehab per therapy recommendation.  Of note, [facility] Care Management services does not replace or interfere with any services that are arranged by transition of care case management or social work.    For questions and additional information, please call:  Lisvet Rasheed A. Saryah Loper, BSN, RN-BC [facility] Liaison Cell: [phone]

## 2019-04-16 NOTE — Progress Notes (Signed)
[facility] Progress Note     Subjective: CC-  Still sore but pain fairly well controlled on oral pain med regimen (tylenol, robaxin, oxycodone). Some shortness of breath with mobility. O2 sats stable mid-90's on room air. Pulling 2000 on IS. PT/OT recommending outpatient PT. Patient concerned about costs.  Objective: Vital signs in last 24 hours: Temp:  [97.9 F (36.6 C)-98.4 F (36.9 C)] 98.4 F (36.9 C) (08/27 0532) Pulse Rate:  [82-95] 90 (08/27 0532) Resp:  [17-18] 18 (08/27 0532) BP: (128-146)/(82-93) 146/88 (08/27 0532) SpO2:  [95 %-99 %] 95 % (08/27 0532) Last BM Date: 04/14/19  Intake/Output from previous day: 08/26 0701 - 08/27 0700 In: 118 [P.O.:118] Out: -  Intake/Output this shift: No intake/output data recorded.  PE: Gen:  Alert, NAD, pleasant HEENT: EOM's intact, pupils equal and round. Healing ecchymosis on chin Card:  RRR Pulm:  CTAB, no W/R/R, effort normal on room air, pulling 2000 on IS Abd: Soft, NT/ND, +BS Ext:  Calves soft and nontender, 1+ edema BLE. Significant ecchymosis noted to left upper arm Psych: A&Ox3  Neuro: moving all 4 extremities Skin: no rashes noted, warm and dry   Lab Results:  Recent Labs    04/14/19 0727  WBC 7.7  HGB 11.3*  HCT 34.9*  PLT 242   BMET Recent Labs    04/14/19 0727  NA 134*  K 3.5  CL 95*  CO2 27  GLUCOSE 97  BUN 15  CREATININE 0.55  CALCIUM 9.4   PT/INR No results for input(s): LABPROT, INR in the last 72 hours. CMP     Component Value Date/Time   NA 134 (L) 04/14/2019 0727   NA 135 08/26/2018 0959   K 3.5 04/14/2019 0727   CL 95 (L) 04/14/2019 0727   CO2 27 04/14/2019 0727   GLUCOSE 97 04/14/2019 0727   BUN 15 04/14/2019 0727   BUN 20 08/26/2018 0959   CREATININE 0.55 04/14/2019 0727   CREATININE 0.62 11/27/2018 1505   CALCIUM 9.4 04/14/2019 0727   PROT 6.9 04/14/2019 0727   PROT 6.4 08/26/2018 0959   ALBUMIN 3.9 04/14/2019 0727   ALBUMIN 3.8 08/26/2018 0959   AST  30 04/14/2019 0727   ALT 17 04/14/2019 0727   ALKPHOS 94 04/14/2019 0727   BILITOT 1.9 (H) 04/14/2019 0727   BILITOT 1.8 (H) 08/26/2018 0959   GFRNONAA >60 04/14/2019 0727   GFRAA >60 04/14/2019 0727   Lipase     Component Value Date/Time   LIPASE 117 (H) 01/06/2019 0235       Studies/Results: No results found.  Anti-infectives: Anti-infectives (From admission, onward)   None       Assessment/Plan Fall 04/11/2019 Anterior L 2nd and R 4-6 rib fxs- pain control and pulmonary toilet. No longer requiring any supplemental oxygen Manubrium fx - pain control Inner lip lac- s/p repair with vicryl sutures by EDP 8/23 Recent R proximal humerus fx - about 6 weeks out, treated nonop by Dr. Ninfa Linden. Last visit 8/12 he recommended continue sling PRN, avoid overhead activities at least 2 more weeks Multiple sclerosis HTN - home meds GERD - protonix Asthma - uses albuterol rarely at home. Will add duonebs PRN wheezing  ID -none VTE -SCDs, lovenox FEN -HH diet Foley -none Follow up -TBD  Plan -Continue therapies. Will recheck later today for possible discharge.   LOS: 2 days    Wellington Hampshire , [facility] 04/16/2019, 8:38 AM Pager: [phone] Mon-Thurs 7:00 am-4:30 pm Fri 7:00  am -11:30 AM Sat-Sun 7:00 am-11:30 am

## 2019-04-16 NOTE — Progress Notes (Signed)
Physical Therapy Treatment Patient Details Name: Catherine Hubbard MRN: [id number] DOB: May 02, 1961 Today's Date: 04/16/2019    History of Present Illness Pt is a 58 y.o. female admitted 04/14/19 continued chest pain after recent falls (04/12/19; at that time imaging crevical spine and head negative for acute abnormality). Pt found to have anterior L 2nd and R 4-6 rib fxs, manubrium fx. Of note, pt also with recent R proximal humerus fx (~6 wks ago, nonoperative). PMH includes multiple sclerosis, HTN, asthma.    PT Comments    Pt progressing well towards physical therapy goals. Ambulating 540 feet with no assistive device at supervision level. Able to perform high level balance activities without overt loss of balance. Home exercise program provided and pt performed for strengthening and balance including: standing calf raises, side stepping, hip flexion/marching, squats, and hamstring curls with hand support. Encouraged increased repetitions for higher intensity. Education re: proper footwear/grip socks when ambulating, eliminating throw rugs, installing rail for steps for fall prevention techniques. Continue to recommend neuro specific focused OPPT to address deficits and maximize functional independence.    Follow Up Recommendations  Outpatient PT;Supervision for mobility/OOB (NEURO)     Equipment Recommendations  None recommended by PT    Recommendations for Other Services       Precautions / Restrictions Precautions Precautions: Fall;Shoulder Type of Shoulder Precautions: no overhead use, sling for comfort--R shoulder Shoulder Interventions: Shoulder sling/immobilizer;For comfort Precaution Booklet Issued: No Precaution Comments: Sternal precautions for comfort (manubrium/rib fxs) Restrictions Weight Bearing Restrictions: No Other Position/Activity Restrictions: Recent RUE fx, reportedly only order is no overhead lifting/movement    Mobility  Bed Mobility Overal bed  mobility: Independent                Transfers Overall transfer level: Independent Equipment used: None                Ambulation/Gait Ambulation/Gait assistance: Supervision Gait Distance (Feet): 540 Feet Assistive device: None Gait Pattern/deviations: Step-through pattern;Decreased stride length     General Gait Details: Slow, steady gait, unable to significantly alter speeds. Able to perform stops/starts, turns, and step over obstacles without overt LOB   Stairs             Wheelchair Mobility    Modified Rankin (Stroke Patients Only)       Balance Overall balance assessment: Needs assistance   Sitting balance-Leahy Scale: Good       Standing balance-Leahy Scale: Good                              Cognition Arousal/Alertness: Awake/alert Behavior During Therapy: WFL for tasks assessed/performed Overall Cognitive Status: Within Functional Limits for tasks assessed                                        Exercises Other Exercises Other Exercises: Standing with varying levels of hand support: side stepping to right and left, squats, hamstring curls, marching, calf raises    General Comments        Pertinent Vitals/Pain Pain Assessment: Faces Faces Pain Scale: Hurts little more Pain Location: bilateral shoulder Pain Descriptors / Indicators: Sore;Guarding Pain Intervention(s): Monitored during session    Home Living                      Prior Function  PT Goals (current goals can now be found in the care plan section) Acute Rehab PT Goals Patient Stated Goal: Rail installed on steps into home Potential to Achieve Goals: Good Progress towards PT goals: Progressing toward goals    Frequency    Min 3X/week      PT Plan Current plan remains appropriate    Co-evaluation              AM-PAC PT "6 Clicks" Mobility   Outcome Measure  Help needed turning from your back to  your side while in a flat bed without using bedrails?: None Help needed moving from lying on your back to sitting on the side of a flat bed without using bedrails?: None Help needed moving to and from a bed to a chair (including a wheelchair)?: None Help needed standing up from a chair using your arms (e.g., wheelchair or bedside chair)?: None Help needed to walk in hospital room?: None Help needed climbing 3-5 steps with a railing? : A Little 6 Click Score: 23    End of Session Equipment Utilized During Treatment: Gait belt Activity Tolerance: Patient tolerated treatment well Patient left: in bed;with call bell/phone within reach Nurse Communication: Mobility status PT Visit Diagnosis: Other abnormalities of gait and mobility (R26.89);Repeated falls (R29.6)     Time: [id number] PT Time Calculation (min) (ACUTE ONLY): 37 min  Charges:  $Therapeutic Exercise: 8-22 mins $Therapeutic Activity: 8-22 mins                     Laurina Bustlearoline Breshae Belcher, PT, DPT Acute Rehabilitation Services Pager [phone] Office [phone]    [name] 04/16/2019, 4:09 PM

## 2019-04-17 MED ORDER — OXYCODONE HCL 5 MG PO TABS: 5.0000 mg | ORAL_TABLET | Freq: Four times a day (QID) | ORAL | 0 refills | Status: DC | PRN

## 2019-04-17 MED ORDER — ACETAMINOPHEN 325 MG PO TABS: 650.0000 mg | ORAL_TABLET | Freq: Four times a day (QID) | ORAL | Status: DC | PRN

## 2019-04-17 MED ORDER — DOCUSATE SODIUM 100 MG PO CAPS: 100.0000 mg | ORAL_CAPSULE | Freq: Two times a day (BID) | ORAL | 0 refills | Status: DC

## 2019-04-17 MED ORDER — GUAIFENESIN ER 600 MG PO TB12: 600.0000 mg | ORAL_TABLET | Freq: Two times a day (BID) | ORAL | 2 refills | Status: DC | PRN

## 2019-04-17 MED ORDER — POLYETHYLENE GLYCOL 3350 17 G PO PACK: 17.0000 g | PACK | Freq: Every day | ORAL | 0 refills | Status: DC | PRN

## 2019-04-17 MED ORDER — METHOCARBAMOL 500 MG PO TABS: 500.0000 mg | ORAL_TABLET | Freq: Three times a day (TID) | ORAL | 0 refills | Status: DC | PRN

## 2019-04-17 NOTE — Discharge Summary (Signed)
[facility] Discharge Summary   Patient ID: Catherine Hubbard MRN: [id number] DOB/AGE: [birth year] 59 y.o.  Admit date: 04/14/2019 Discharge date: 04/17/2019  Admitting Diagnosis: Fall 04/11/2019 Anterior L 2nd and R 4-6 rib fxs  Manubrium fx Inner lip lac Recent R proximal humerus fx Multiple sclerosis HTN GERD  Asthma   Discharge Diagnosis Patient Active Problem List   Diagnosis Date Noted  . Multiple rib fractures 04/14/2019  . Multiple fractures of ribs, bilateral, initial encounter for closed fracture 04/14/2019  . Closed 3-part fracture of proximal humerus with routine healing 04/01/2019  . Multiple sclerosis exacerbation (HCC) 11/16/2018  . Exacerbation of multiple sclerosis (HCC) 11/16/2018  . Pneumonia 09/26/2018  . Elevated troponin 09/26/2018  . Acute hypokalemia 09/25/2018  . Arthritis of carpometacarpal joint 03/27/2018  . Nocturnal leg cramps 03/26/2018  . Syncope due to orthostatic hypotension 02/12/2018  . Diarrhea 02/08/2018  . Acute lower UTI 02/08/2018  . Hematoma of scalp 02/08/2018  . Neck pain 01/20/2018  . Foreign body in esophagus   . Esophageal stricture   . Intractable nausea and vomiting 01/31/2017  . Dysphagia   . Severe protein-calorie malnutrition (HCC)   . Nausea & vomiting 01/30/2017  . Hypokalemia 01/30/2017  . Acute kidney injury (HCC) 01/30/2017  . Cystitis 01/30/2017  . Costochondritis 05/11/2016  . Trochanteric bursitis of both hips 03/28/2016  . Upper back pain 03/28/2016  . Bilateral low back pain with bilateral sciatica 07/13/2015  . Right hip pain 07/13/2015  . Lumbar radicular pain 03/21/2015  . Other fatigue 01/11/2015  . Dysesthesia 01/11/2015  . Urinary urgency 01/11/2015  . Multiple sclerosis (HCC) 10/06/2014  . Numbness 10/06/2014  . Ataxic gait 10/06/2014  . High risk medication use 10/06/2014  . Gastric bypass status for obesity 10/06/2014  . Cognitive changes 10/06/2014  . Depression with  anxiety 10/06/2014  . Restless leg 10/06/2014  . Insomnia 10/06/2014  . Essential hypertension 10/06/2014  . Difficulty hearing 01/13/2014  . Other muscle spasm 01/13/2014    Consultants None  Imaging: No results found.  Procedures None  Hospital Course:  Catherine Hubbard is a 58yo female PMH multiple sclerosis, HTN, and remote right proximal humerus fracture 6 weeks prior secondary to a fall, who was transferred from [facility] to [facility] 8/25 for admission to the trauma service after suffering a ground level fall. Workup showed a manubrium fracture as well as multiple bilateral rib fractures (anterior left 2nd and right 4-6). Patient was admitted for pain control and pulmonary toilet.  Patient worked with therapies during this admission who recommended outpatient neuro PT when medically stable for discharge. On 8/28 the patient was ambulating well, pain well controlled, vital signs stable, and felt stable for discharge home.  Patient will follow up as below and knows to call with questions or concerns.    I have personally reviewed the patients medication history on the [place] controlled substance database.    Physical Exam: Gen: Alert, NAD, pleasant HEENT: EOM's intact, pupils equal and round. Healing ecchymosis on chin Card: RRR Pulm: CTAB, no W/R/R, effort normal on room air, pulling 1750 on IS Abd: Soft, NT/ND, +BS HKF:EXMDYJ soft and nontender, 1+ edema BLE. Significant ecchymosis noted to left upper arm Psych: A&Ox3 Neuro: moving all 4 extremities Skin: no rashes noted, warm and dry   Allergies as of 04/17/2019      Reactions   Ibuprofen Other (See Comments)   Cannot take due to gastric bypass   Nsaids Other (See Comments)  Gastric bypass      Medication List    STOP taking these medications   baclofen 10 MG tablet Commonly known as: LIORESAL   busPIRone 7.5 MG tablet Commonly known as: BUSPAR   hydrochlorothiazide 25 MG tablet Commonly known as:  HYDRODIURIL   sucralfate 1 g tablet Commonly known as: CARAFATE   tiZANidine 4 MG tablet Commonly known as: Zanaflex     TAKE these medications   acetaminophen 325 MG tablet Commonly known as: TYLENOL Take 2 tablets (650 mg total) by mouth every 6 (six) hours as needed for mild pain.   D2000 Ultra Strength 50 MCG (2000 UT) Caps Generic drug: Cholecalciferol Take 1 capsule by mouth daily.   docusate sodium 100 MG capsule Commonly known as: COLACE Take 1 capsule (100 mg total) by mouth 2 (two) times daily.   DULoxetine 60 MG capsule Commonly known as: Cymbalta Take 1 capsule (60 mg total) by mouth daily.   guaiFENesin 600 MG 12 hr tablet Commonly known as: Mucinex Take 1 tablet (600 mg total) by mouth 2 (two) times daily as needed for to loosen phlegm.   hydrocortisone 1 % ointment Apply 1 application topically 2 (two) times daily.   ibuprofen 800 MG tablet Commonly known as: ADVIL Take 1 tablet (800 mg total) by mouth every 8 (eight) hours as needed. What changed: reasons to take this   lidocaine 5 % Commonly known as: Lidoderm Place 1 patch onto the skin daily. Remove & Discard patch within 12 hours or as directed by MD   methocarbamol 500 MG tablet Commonly known as: ROBAXIN Take 1 tablet (500 mg total) by mouth every 8 (eight) hours as needed for muscle spasms.   modafinil 200 MG tablet Commonly known as: PROVIGIL TAKE 1 TABLET BY MOUTH EVERY DAY   multivitamin with minerals Tabs tablet Take 1 tablet by mouth daily.   olmesartan 5 MG tablet Commonly known as: BENICAR Take 2 tablets (10 mg total) by mouth daily.   omeprazole 20 MG capsule Commonly known as: PRILOSEC Take 2 capsules (40 mg total) by mouth 2 (two) times daily.   oxybutynin 5 MG tablet Commonly known as: DITROPAN Take 5 mg by mouth every 8 (eight) hours as needed for bladder spasms.   oxyCODONE 5 MG immediate release tablet Commonly known as: Oxy IR/ROXICODONE Take 1-2 tablets (5-10 mg  total) by mouth every 6 (six) hours as needed for moderate pain or severe pain. What changed:   how much to take  when to take this  reasons to take this   polyethylene glycol 17 g packet Commonly known as: MIRALAX / GLYCOLAX Take 17 g by mouth daily as needed for mild constipation.        Follow-up Information    Saguier, Iris Pert. Call.   Specialties: Internal Medicine, Family Medicine Why: Call to arrange follow up in 2-3 weeks with your primary care physician Contact information: [place] [address] [phone]           Signed: Wellington Hampshire, [facility] 04/17/2019, 7:56 AM Pager: [phone] Mon-Thurs 7:00 am-4:30 pm Fri 7:00 am -11:30 AM Sat-Sun 7:00 am-11:30 am

## 2019-04-28 ENCOUNTER — Ambulatory Visit: Payer: Federal, State, Local not specified - PPO | Admitting: Medical

## 2019-04-28 ENCOUNTER — Other Ambulatory Visit: Payer: Self-pay

## 2019-04-28 ENCOUNTER — Encounter: Payer: Self-pay | Admitting: Medical

## 2019-04-28 VITALS — BP 120/70 | HR 70 | Temp 97.6°F | Resp 16 | Ht 65.0 in | Wt 156.4 lb

## 2019-04-28 DIAGNOSIS — I10 Essential (primary) hypertension: Secondary | ICD-10-CM

## 2019-04-28 DIAGNOSIS — S2221XA Fracture of manubrium, initial encounter for closed fracture: Secondary | ICD-10-CM | POA: Diagnosis not present

## 2019-04-28 DIAGNOSIS — S2249XD Multiple fractures of ribs, unspecified side, subsequent encounter for fracture with routine healing: Secondary | ICD-10-CM | POA: Diagnosis not present

## 2019-04-28 DIAGNOSIS — Z4802 Encounter for removal of sutures: Secondary | ICD-10-CM

## 2019-04-28 NOTE — Progress Notes (Signed)
Subjective:    Patient ID: Catherine Hubbard, female    DOB: [birth year], 58 y.o.   MRN: [id number]  HPI   Pt in for follow up.  Pt seen in ED late august and then admitted for humerus fracture.  Hospital course notes. Catherine Hubbard is a 58yo female PMH multiple sclerosis, HTN, and remote right proximal humerus fracture 6 weeks prior secondary to a fall, who was transferred from [facility] to [facility] 8/25 for admission to the trauma service after suffering a ground level fall. Workup showed a manubrium fracture as well as multiple bilateral rib fractures (anterior left 2nd and right 4-6). Patient was admitted for pain control and pulmonary toilet.  Patient worked with therapies during this admission who recommended outpatient neuro PT when medically stable for discharge. On 8/28 the patient was ambulating well, pain well controlled, vital signs stable, and felt stable for discharge home.  Patient will follow up as below and knows to call with questions or concerns.    Pt has not been to PT done yet.  Pt states has suture inside  Her buccal mucosa.The procedure was done on 04/12/2019 in ED.  On ED note alcohol abuse was factor in the fall.   She states by the time she feel she was not using any narcotics which she had for prior fracture.   I brought up with patient whether last fall was alcohol related. She does admit to drinking 2 beers at lunch. The fall occurred hours later. She also states she was not on any narcotics.  Pt has htn. She is on almosartan.       Review of Systems  Constitutional: Negative for chills, fatigue and fever.  Respiratory: Negative for cough, chest tightness, shortness of breath and wheezing.   Cardiovascular: Negative for chest pain and palpitations.  Gastrointestinal: Negative for abdominal pain.  Musculoskeletal:       Pt states pain is much less in areas of her fracture..   She does have sternum fracture depending on positions. No cardiac  type signs/symptoms associated.  Neurological: Negative for dizziness, speech difficulty, weakness and headaches.  Hematological: Negative for adenopathy. Bruises/bleeds easily.  Psychiatric/Behavioral: Negative for behavioral problems and confusion.    Past Medical History:  Diagnosis Date  . Alcohol abuse   . Asthma   . Esophagitis   . GERD (gastroesophageal reflux disease)   . Headache   . Hearing loss   . Hypertension   . Multiple sclerosis (HCC)   . Pancreatitis   . Vision abnormalities      Social History   Socioeconomic History  . Marital status: Married    Spouse name: Not on file  . Number of children: Not on file  . Years of education: Not on file  . Highest education level: Not on file  Occupational History  . Not on file  Social Needs  . Financial resource strain: Not on file  . Food insecurity    Worry: Not on file    Inability: Not on file  . Transportation needs    Medical: Not on file    Non-medical: Not on file  Tobacco Use  . Smoking status: Never Smoker  . Smokeless tobacco: Never Used  . Tobacco comment: second hand smoker when young. Heavy exposure.  Substance and Sexual Activity  . Alcohol use: Yes    Comment: Sts. she drinks beer, liquor daily, varying amts/fim  . Drug use: No  . Sexual activity: Not Currently  Lifestyle  .  Physical activity    Days per week: Not on file    Minutes per session: Not on file  . Stress: Not on file  Relationships  . Social Herbalist on phone: Not on file    Gets together: Not on file    Attends religious service: Not on file    Active member of club or organization: Not on file    Attends meetings of clubs or organizations: Not on file    Relationship status: Not on file  . Intimate partner violence    Fear of current or ex partner: Not on file    Emotionally abused: Not on file    Physically abused: Not on file    Forced sexual activity: Not on file  Other Topics Concern  . Not on file   Social History Narrative  . Not on file    Past Surgical History:  Procedure Laterality Date  . ANKLE FRACTURE SURGERY Bilateral   . CARPAL TUNNEL RELEASE Bilateral   . CHOLECYSTECTOMY    . COLONOSCOPY  11/2016   multiple  . ESOPHAGOGASTRODUODENOSCOPY  11/2016  . ESOPHAGOGASTRODUODENOSCOPY N/A 02/01/2017   Procedure: ESOPHAGOGASTRODUODENOSCOPY (EGD);  Surgeon: Gatha Mayer, MD;  Location: [facility] ENDOSCOPY;  Service: Endoscopy;  Laterality: N/A;  . ESOPHAGOGASTRODUODENOSCOPY (EGD) WITH PROPOFOL N/A 02/04/2017   Procedure: ESOPHAGOGASTRODUODENOSCOPY (EGD) WITH PROPOFOL;  Surgeon: Ladene Artist, MD;  Location: [facility] ENDOSCOPY;  Service: Endoscopy;  Laterality: N/A;  . [place] RESECTION  2011  . ULNAR NERVE REPAIR Bilateral     Family History  Problem Relation Age of Onset  . Cancer Mother   . Stroke Mother   . Cancer Father     Allergies  Allergen Reactions  . 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 Nsaids Other (See Comments)    Gastric bypass    Current Outpatient Medications on File Prior to Visit  Medication Sig Dispense Refill  . acetaminophen (TYLENOL) 325 MG tablet Take 2 tablets (650 mg total) by mouth every 6 (six) hours as needed for mild pain.    . Cholecalciferol (D2000 ULTRA STRENGTH) 50 MCG (2000 UT) CAPS Take 1 capsule by mouth daily.     . DULoxetine (CYMBALTA) 60 MG capsule Take 1 capsule (60 mg total) by mouth daily. 30 capsule 11  . modafinil (PROVIGIL) 200 MG tablet TAKE 1 TABLET BY MOUTH EVERY DAY (Patient taking differently: Take 200 mg by mouth daily. ) 30 tablet 5  . Multiple Vitamin (MULTIVITAMIN WITH MINERALS) TABS tablet Take 1 tablet by mouth daily.    Marland Kitchen olmesartan (BENICAR) 5 MG tablet Take 2 tablets (10 mg total) by mouth daily. 60 tablet 1  . omeprazole (PRILOSEC) 20 MG capsule Take 2 capsules (40 mg total) by mouth 2 (two) times daily. 60 capsule 2  . [DISCONTINUED] albuterol (PROVENTIL) (2.5  MG/3ML) 0.083% nebulizer solution Take 3 mLs (2.5 mg total) by nebulization every 6 (six) hours as needed for wheezing or shortness of breath. (Patient not taking: Reported on 01/29/2019) 150 mL 1  . [DISCONTINUED] furosemide (LASIX) 20 MG tablet Take 1 tablet (20 mg total) by mouth daily. (Patient not taking: Reported on 01/29/2019) 5 tablet 0  . [DISCONTINUED] potassium chloride SA (K-DUR) 20 MEQ tablet Take 1 tablet (20 mEq total) by mouth 2 (two) times daily. (Patient not taking: Reported on 01/29/2019) 10 tablet 0   No current facility-administered medications on file prior to visit.  BP (!) 130/92   Pulse 70   Temp 97.6 F (36.4 C) (Temporal)   Resp 16   Ht 5\' 5"  (1.651 m)   Wt 156 lb 6.4 oz (70.9 kg)   LMP 08/13/2014 Comment: very few periods  SpO2 100%   BMI 26.03 kg/m       Objective:   Physical Exam  General Mental Status- Alert. General Appearance- Not in acute distress.   Skin General: Color- Normal Color. Moisture- Normal Moisture.  Neck Carotid Arteries- Normal color. Moisture- Normal Moisture. No carotid bruits. No JVD.  Chest and Lung Exam Auscultation: Breath Sounds:-Normal.  Cardiovascular Auscultation:Rythm- Regular. Murmurs & Other Heart Sounds:Auscultation of the heart reveals- No Murmurs.  Abdomen Inspection:-Inspeection Normal. Palpation/Percussion:Note:No mass. Palpation and Percussion of the abdomen reveal- Non Tender, Non Distended + BS, no rebound or guarding.    Neurologic Cranial Nerve exam:- CN III-XII intact(No nystagmus), symmetric smile. Strength:- 5/5 equal and symmetric strength both upper and lower extremities.      Assessment & Plan:  Your blood pressure is well controlled. Continue olmesartan.  Your pain for fracture is much improved. Can use tylenol for pain. Not wanting to give any narcotics as I don't want to effect your balance with history of fall.s  Call PT so they can do balance assessment in light of your MS  history. Recommend use walker.  Flu vaccine.  Removed one suture. Absorbable but still present 16 days after placement. We discussed and you want removed.   For sternum fracture, I advised pt to be seen if pain not associated with movement or if associated cardiac type signs/symptoms. Any severe pain then ED evaluation.    Follow up in 3 weeks or as needed  25+ minutes spent with pt. 50% of time spent counseling on plan going forwared.  Esperanza RichtersEdward Breionna Punt, PA-C

## 2019-04-28 NOTE — Patient Instructions (Addendum)
Your blood pressure is well controlled. Continue olmesartan.  Your pain for fracture is much improved. Can use tylenol for pain. Not wanting to give any narcotics as I don't want to effect your balance with history of fall.s  Call PT so they can do balance assessment in light of your MS history. Recommend use walker.  Flu vaccine.  Removed one suture. Absorbable but still present 16 days after placement. We discussed and you want removed.   For sternum fracture, I advised pt to be seen if pain not associated with movement or if associated cardiac type signs/symptoms. Any severe pain then ED evaluation.  Discussed ED report of alcohol playing role in fall. Pt does not agree that is case on most recent fall. Counseled not to drink alcohol.  Follow up in 3 weeks or as needed

## 2019-04-29 ENCOUNTER — Ambulatory Visit (INDEPENDENT_AMBULATORY_CARE_PROVIDER_SITE_OTHER): Payer: Federal, State, Local not specified - PPO

## 2019-04-29 ENCOUNTER — Ambulatory Visit (INDEPENDENT_AMBULATORY_CARE_PROVIDER_SITE_OTHER): Payer: Federal, State, Local not specified - PPO | Admitting: Orthopaedic Surgery

## 2019-04-29 ENCOUNTER — Encounter: Payer: Self-pay | Admitting: Orthopaedic Surgery

## 2019-04-29 DIAGNOSIS — S42292D Other displaced fracture of upper end of left humerus, subsequent encounter for fracture with routine healing: Secondary | ICD-10-CM

## 2019-04-29 NOTE — Progress Notes (Signed)
Office Visit Note   Patient: Catherine Hubbard           Date of Birth: 09-05-1960           MRN: [id number] Visit Date: 04/29/2019              Requested by: Mackie Pai, PA-C [address] [place],  [place] [id number] PCP: Mackie Pai, PA-C   Assessment & Plan: Visit Diagnoses:  1. Closed 3-part fracture of proximal end of left humerus with routine healing, subsequent encounter     Plan: She will perform Codman, pendulum exercises as shown.  I did suggest that she wear her sling when out of the home.  She will continue work on range of motion elbow forearm wrist and hand.  Follow-up in 1 month 3 views of the right shoulder at that time.  Questions were encouraged and answered at length.  Follow-Up Instructions: Return in about 4 weeks (around 05/27/2019).   Orders:  Orders Placed This Encounter  Procedures  . XR Shoulder Right   No orders of the defined types were placed in this encounter.     Procedures: No procedures performed   Clinical Data: No additional findings.   Subjective: Chief Complaint  Patient presents with  . Left Shoulder - Follow-up    HPI Mrs. Butler-Taylor returns today almost 2 months status post right shoulder injury in which she sustained a proximal humerus fracture.  She states she was doing well until she recently fell.  She had no loss of consciousness.  She reports that she was wearing socks on hardwood floor on 822 and she slipped and fell face forward.  She was seen in the ER.  She sustained laceration to her lower lip.  Also manubrium fracture and multiple bilateral rib fractures.   Review of Systems See HPI otherwise negative.  Objective: Vital Signs: LMP 08/13/2014 Comment: very few periods  Physical Exam Constitutional:      Appearance: She is not ill-appearing or diaphoretic.  Pulmonary:     Effort: Pulmonary effort is normal.  Neurological:     Mental Status: She is alert and oriented to person,  place, and time.     Ortho Exam Fluid motion with limited internal and external rotation of the right shoulder.  No overhead activity attempted.  Full range of motion of the right elbow has full supination pronation of the right forearm.  Right hand full sensation throughout.  Radial pulse intact right hand. Specialty Comments:  No specialty comments available.  Imaging: No results found.   PMFS History: Patient Active Problem List   Diagnosis Date Noted  . Multiple rib fractures 04/14/2019  . Multiple fractures of ribs, bilateral, initial encounter for closed fracture 04/14/2019  . Closed 3-part fracture of proximal humerus with routine healing 04/01/2019  . Multiple sclerosis exacerbation ([place]) 11/16/2018  . Exacerbation of multiple sclerosis ([place]) 11/16/2018  . Pneumonia 09/26/2018  . Elevated troponin 09/26/2018  . Acute hypokalemia 09/25/2018  . Arthritis of carpometacarpal joint 03/27/2018  . Nocturnal leg cramps 03/26/2018  . Syncope due to orthostatic hypotension 02/12/2018  . Diarrhea 02/08/2018  . Acute lower UTI 02/08/2018  . Hematoma of scalp 02/08/2018  . Neck pain 01/20/2018  . Foreign body in esophagus   . Esophageal stricture   . Intractable nausea and vomiting 01/31/2017  . Dysphagia   . Severe protein-calorie malnutrition ([place])   . Nausea & vomiting 01/30/2017  . Hypokalemia 01/30/2017  . Acute kidney  injury (HCC) 01/30/2017  . Cystitis 01/30/2017  . Costochondritis 05/11/2016  . Trochanteric bursitis of both hips 03/28/2016  . Upper back pain 03/28/2016  . Bilateral low back pain with bilateral sciatica 07/13/2015  . Right hip pain 07/13/2015  . Lumbar radicular pain 03/21/2015  . Other fatigue 01/11/2015  . Dysesthesia 01/11/2015  . Urinary urgency 01/11/2015  . Multiple sclerosis (HCC) 10/06/2014  . Numbness 10/06/2014  . Ataxic gait 10/06/2014  . High risk medication use 10/06/2014  . Gastric bypass status for obesity 10/06/2014  . Cognitive  changes 10/06/2014  . Depression with anxiety 10/06/2014  . Restless leg 10/06/2014  . Insomnia 10/06/2014  . Essential hypertension 10/06/2014  . Difficulty hearing 01/13/2014  . Other muscle spasm 01/13/2014   Past Medical History:  Diagnosis Date  . Alcohol abuse   . Asthma   . Esophagitis   . GERD (gastroesophageal reflux disease)   . Headache   . Hearing loss   . Hypertension   . Multiple sclerosis (HCC)   . Pancreatitis   . Vision abnormalities     Family History  Problem Relation Age of Onset  . Cancer Mother   . Stroke Mother   . Cancer Father     Past Surgical History:  Procedure Laterality Date  . ANKLE FRACTURE SURGERY Bilateral   . CARPAL TUNNEL RELEASE Bilateral   . CHOLECYSTECTOMY    . COLONOSCOPY  11/2016   multiple  . ESOPHAGOGASTRODUODENOSCOPY  11/2016  . ESOPHAGOGASTRODUODENOSCOPY N/A 02/01/2017   Procedure: ESOPHAGOGASTRODUODENOSCOPY (EGD);  Surgeon: Iva Boop, MD;  Location[facility] ENDOSCOPY;  Service: Endoscopy;  Laterality: N/A;  . ESOPHAGOGASTRODUODENOSCOPY (EGD) WITH PROPOFOL N/A 02/04/2017   Procedure: ESOPHAGOGASTRODUODENOSCOPY (EGD) WITH PROPOFOL;  Surgeon: Meryl Dare, MD;  Location: [facility] ENDOSCOPY;  Service: Endoscopy;  Laterality: N/A;  . [place]AR[place]COP[place] GASTRIC [place]EV[place] RESECTION  2011  . ULNAR NERVE REPAIR Bilateral    Social History   Occupational History  . Not on file  Tobacco Use  . Smoking status: Never Smoker  . Smokeless tobacco: Never Used  . Tobacco comment: second hand smoker when young. Heavy exposure.  Substance and Sexual Activity  . Alcohol use: Yes    Comment: Sts. she drinks beer, liquor daily, varying amts/fim  . Drug use: No  . Sexual activity: Not Currently

## 2019-05-02 ENCOUNTER — Other Ambulatory Visit: Payer: Self-pay | Admitting: Medical

## 2019-05-06 DIAGNOSIS — K080 Exfoliation of teeth due to systemic causes: Secondary | ICD-10-CM | POA: Diagnosis not present

## 2019-05-11 ENCOUNTER — Telehealth: Payer: Self-pay | Admitting: *Deleted

## 2019-05-11 NOTE — Telephone Encounter (Addendum)
Called, LVM for pt to call.  She is past due for f/u and needs to schedule one at her earliest convenience   Pt last Ocrevus: 02/18/2019. Next: 08/24/19 at 8am. Medication via specialty pharmacy.

## 2019-05-14 NOTE — Telephone Encounter (Signed)
Called, Lvm for pt to call office to schedule f/u. Gave [facility] phone number

## 2019-05-25 ENCOUNTER — Encounter: Payer: Self-pay | Admitting: *Deleted

## 2019-05-25 NOTE — Telephone Encounter (Signed)
Called, LVM for pt to call and schedule a follow up appt. Gave [facility] phone number. Will send pt letter since unable to reach by phone.

## 2019-06-01 ENCOUNTER — Other Ambulatory Visit: Payer: Self-pay | Admitting: Medical

## 2019-06-01 DIAGNOSIS — M9903 Segmental and somatic dysfunction of lumbar region: Secondary | ICD-10-CM | POA: Diagnosis not present

## 2019-06-01 DIAGNOSIS — M9901 Segmental and somatic dysfunction of cervical region: Secondary | ICD-10-CM | POA: Diagnosis not present

## 2019-06-01 DIAGNOSIS — M9902 Segmental and somatic dysfunction of thoracic region: Secondary | ICD-10-CM | POA: Diagnosis not present

## 2019-06-01 DIAGNOSIS — M791 Myalgia, unspecified site: Secondary | ICD-10-CM | POA: Diagnosis not present

## 2019-06-01 MED ORDER — OLMESARTAN MEDOXOMIL 5 MG PO TABS: 10.0000 mg | ORAL_TABLET | Freq: Every day | ORAL | 1 refills | Status: DC

## 2019-06-01 NOTE — Telephone Encounter (Signed)
Medication Refill - Medication: olmesartan (BENICAR) 5 MG tablet   Has the patient contacted their pharmacy? Yes.   (Agent: If no, request that the patient contact the pharmacy for the refill.) (Agent: If yes, when and what did the pharmacy advise?)  Preferred Pharmacy (with phone number or street name): [id number] - [place], [place]  Agent: Please be advised that RX refills may take up to 3 business days. We ask that you follow-up with your pharmacy.

## 2019-06-02 ENCOUNTER — Other Ambulatory Visit: Payer: Self-pay

## 2019-06-02 ENCOUNTER — Encounter: Payer: Self-pay | Admitting: Family Medicine

## 2019-06-02 ENCOUNTER — Ambulatory Visit: Payer: Federal, State, Local not specified - PPO | Admitting: Family Medicine

## 2019-06-02 VITALS — BP 104/80 | HR 68 | Temp 98.2°F | Ht 63.0 in | Wt 163.0 lb

## 2019-06-02 DIAGNOSIS — F418 Other specified anxiety disorders: Secondary | ICD-10-CM

## 2019-06-02 DIAGNOSIS — R260 Ataxic gait: Secondary | ICD-10-CM

## 2019-06-02 DIAGNOSIS — G35 Multiple sclerosis: Secondary | ICD-10-CM | POA: Diagnosis not present

## 2019-06-02 DIAGNOSIS — R5383 Other fatigue: Secondary | ICD-10-CM

## 2019-06-02 DIAGNOSIS — M5136 Other intervertebral disc degeneration, lumbar region: Secondary | ICD-10-CM

## 2019-06-02 DIAGNOSIS — Z79899 Other long term (current) drug therapy: Secondary | ICD-10-CM | POA: Diagnosis not present

## 2019-06-02 DIAGNOSIS — G8929 Other chronic pain: Secondary | ICD-10-CM

## 2019-06-02 NOTE — Patient Instructions (Signed)
Please continue current treatment plan.   We will refer you to pain management for concerns of degenerative disc disease most likely contributing to back pain. 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   Follow up in 6 months   Fall Prevention in the Home, Adult Falls can cause injuries. They can happen to people of all ages. There are many things you can do to make your home safe and to help prevent falls. Ask for help when making these changes, if needed. What actions can I take to prevent falls? General Instructions  Use good lighting in all rooms. Replace any light bulbs that burn out.  Turn on the lights when you go into a dark area. Use night-lights.  Keep items that you use often in easy-to-reach places. Lower the shelves around your home if necessary.  Set up your furniture so you have a clear path. Avoid moving your furniture around.  Do not have throw rugs and other things on the floor that can make you trip.  Avoid walking on wet floors.  If any of your floors are uneven, fix them.  Add color or contrast paint or tape to clearly mark and help you see: ? Any grab bars or handrails. ? First and last steps of stairways. ? Where the edge of each step is.  If you use a stepladder: ? Make sure that it is fully opened. Do not climb a closed stepladder. ? Make sure that both sides of the stepladder are locked into place. ? Ask someone to hold the stepladder for you while you use it.  If there are any pets around you, be aware of where they are. What can I do in the bathroom?      Keep the floor dry. Clean up any water that spills onto the floor as soon as it happens.  Remove soap buildup in the tub or shower regularly.  Use non-skid mats or decals on the floor of the tub or shower.  Attach bath mats securely with double-sided, non-slip rug tape.  If you need to sit down in the shower, use a plastic, non-slip stool.   Install grab bars by the toilet and in the tub and shower. Do not use towel bars as grab bars. What can I do in the bedroom?  Make sure that you have a light by your bed that is easy to reach.  Do not use any sheets or blankets that are too big for your bed. They should not hang down onto the floor.  Have a firm chair that has side arms. You can use this for support while you get dressed. What can I do in the kitchen?  Clean up any spills right away.  If you need to reach something above you, use a strong step stool that has a grab bar.  Keep electrical cords out of the way.  Do not use floor polish or wax that makes floors slippery. If you must use wax, use non-skid floor wax. What can I do with my stairs?  Do not leave any items on the stairs.  Make sure that you have a light switch at the top of the stairs and the bottom of the stairs. If you do not have them, ask someone to add them for you.  Make sure that there are handrails on both sides of the stairs, and use them. Fix handrails that are broken or loose. Make sure that handrails  are as long as the stairways.  Install non-slip stair treads on all stairs in your home.  Avoid having throw rugs at the top or bottom of the stairs. If you do have throw rugs, attach them to the floor with carpet tape.  Choose a carpet that does not hide the edge of the steps on the stairway.  Check any carpeting to make sure that it is firmly attached to the stairs. Fix any carpet that is loose or worn. What can I do on the outside of my home?  Use bright outdoor lighting.  Regularly fix the edges of walkways and driveways and fix any cracks.  Remove anything that might make you trip as you walk through a door, such as a raised step or threshold.  Trim any bushes or trees on the path to your home.  Regularly check to see if handrails are loose or broken. Make sure that both sides of any steps have handrails.  Install guardrails along the  edges of any raised decks and porches.  Clear walking paths of anything that might make someone trip, such as tools or rocks.  Have any leaves, snow, or ice cleared regularly.  Use sand or salt on walking paths during winter.  Clean up any spills in your garage right away. This includes grease or oil spills. What other actions can I take?  Wear shoes that: ? Have a low heel. Do not wear high heels. ? Have rubber bottoms. ? Are comfortable and fit you well. ? Are closed at the toe. Do not wear open-toe sandals.  Use tools that help you move around (mobility aids) if they are needed. These include: ? Canes. ? Walkers. ? Scooters. ? Crutches.  Review your medicines with your doctor. Some medicines can make you feel dizzy. This can increase your chance of falling. Ask your doctor what other things you can do to help prevent falls. Where to find more information  Centers for Disease Control and Prevention, [facility]: https://garcia.biz/  [employer] on Aging: BrainJudge.co.uk Contact a doctor if:  You are afraid of falling at home.  You feel weak, drowsy, or dizzy at home.  You fall at home. Summary  There are many simple things that you can do to make your home safe and to help prevent falls.  Ways to make your home safe include removing tripping hazards and installing grab bars in the bathroom.  Ask for help when making these changes in your home. This information is not intended to replace advice given to you by your health care provider. Make sure you discuss any questions you have with your health care provider. Document Released: 06/02/2009 Document Revised: 11/27/2018 Document Reviewed: 03/21/2017 Elsevier Patient Education  2020 [employer].   Degenerative Disk Disease  Degenerative disk disease is a condition caused by changes that occur in the spinal disks as a person ages. Spinal disks are soft and compressible disks located between the bones of  your spine (vertebrae). These disks act like shock absorbers. Degenerative disk disease can affect the whole spine. However, the neck and lower back are most often affected. Many changes can occur in the spinal disks with aging, such as:  The spinal disks may dry and shrink.  Small tears may occur in the tough, outer covering of the disk (annulus).  The disk space may become smaller due to loss of water.  Abnormal growths in the bone (spurs) may occur. This can put pressure on the nerve roots exiting the  spinal canal, causing pain.  The spinal canal may become narrowed. What are the causes? This condition may be caused by:  Normal degeneration with age.  Injuries.  Certain activities and sports that cause damage. What increases the risk? The following factors may make you more likely to develop this condition:  Being overweight.  Having a family history of degenerative disk disease.  Smoking.  Sudden injury.  Doing work that requires heavy lifting. What are the signs or symptoms? Symptoms of this condition include:  Pain that varies in intensity. Some people have no pain, while others have severe pain. The location of the pain depends on the part of your backbone that is affected. You may have: ? Pain in your neck or arm if a disk in your neck area is affected. ? Pain in your back, buttocks, or legs if a disk in your lower back is affected.  Pain that becomes worse while bending or reaching up, or with twisting movements.  Pain that may start gradually and then get worse as time passes. It may also start after a major or minor injury.  Numbness or tingling in the arms or legs. How is this diagnosed? This condition may be diagnosed based on:  Your symptoms and medical history.  A physical exam.  Imaging tests, including: ? An X-ray of the spine. ? MRI. How is this treated? This condition may be treated with:  Medicines.  Rehabilitation exercises. These  activities aim to strengthen muscles in your back and abdomen to better support your spine. If treatments do not help to relieve your symptoms or you have severe pain, you may need surgery. Follow these instructions at home: Medicines  Take over-the-counter and prescription medicines only as told by your health care provider.  Do not drive or use heavy machinery while taking prescription pain medicine.  If you are taking prescription pain medicine, take actions to prevent or treat constipation. Your health care provider may recommend that you: ? Drink enough fluid to keep your urine pale yellow. ? Eat foods that are high in fiber, such as fresh fruits and vegetables, whole grains, and beans. ? Limit foods that are high in fat and processed sugars, such as fried or sweet foods. ? Take an over-the-counter or prescription medicine for constipation. Activity  Rest as told by your health care provider.  Ask your health care provider what activities are safe for you. Return to your normal activities as directed.  Avoid sitting for a long time without moving. Get up to take short walks every 1-2 hours. This is important to improve blood flow and breathing. Ask for help if you feel weak or unsteady.  Perform relaxation exercises as told by your health care provider.  Maintain good posture.  Do not lift anything that is heavier than 10 lb (4.5 kg), or the limit that you are told, until your health care provider says that it is safe.  Follow proper lifting and walking techniques as told by your health care provider. Managing pain, stiffness, and swelling   If directed, put ice on the painful area. Icing can help to relieve pain. ? Put ice in a plastic bag. ? Place a towel between your skin and the bag. ? Leave the ice on for 20 minutes, 2-3 times a day.  If directed, apply heat to the painful area as often as told by your health care provider. Heat can reduce the stiffness of your muscles.  Use the heat source that your  health care provider recommends, such as a moist heat pack or a heating pad. ? Place a towel between your skin and the heat source. ? Leave the heat on for 20-30 minutes. ? Remove the heat if your skin turns bright red. This is especially important if you are unable to feel pain, heat, or cold. You may have a greater risk of getting burned. General instructions  Change your sitting, standing, and sleeping habits as told by your health care provider.  Avoid sitting in the same position for long periods of time. Change positions frequently.  Lose weight or maintain a healthy weight as told by your health care provider.  Do not use any products that contain nicotine or tobacco, such as cigarettes and e-cigarettes. If you need help quitting, ask your health care provider.  Wear supportive footwear.  Keep all follow-up visits as told by your health care provider. This is important. This may include visits for physical therapy. Contact a health care provider if you:  Have pain that does not go away within 1-4 weeks.  Lose your appetite.  Lose weight without trying. Get help right away if you:  Have severe pain.  Notice weakness in your arms, hands, or legs.  Begin to lose control of your bladder or bowel movements.  Have fevers or night sweats. Summary  Degenerative disk disease is a condition caused by changes that occur in the spinal disks as a person ages.  Degenerative disk disease can affect the whole spine. However, the neck and lower back are most often affected.  Take over-the-counter and prescription medicines only as told by your health care provider. This information is not intended to replace advice given to you by your health care provider. Make sure you discuss any questions you have with your health care provider. Document Released: 06/03/2007 Document Revised: 08/01/2017 Document Reviewed: 08/01/2017 Elsevier Patient Education  2020  [employer]   Chronic Pain, Adult Chronic pain is a type of pain that lasts or keeps coming back (recurs) for at least six months. You may have chronic headaches, abdominal pain, or body pain. Chronic pain may be related to an illness, such as fibromyalgia or complex regional pain syndrome. Sometimes the cause of chronic pain is not known. Chronic pain can make it hard for you to do daily activities. If not treated, chronic pain can lead to other health problems, including anxiety and depression. Treatment depends on the cause and severity of your pain. You may need to work with a pain specialist to come up with a treatment plan. The plan may include medicine, counseling, and physical therapy. Many people benefit from a combination of two or more types of treatment to control their pain. Follow these instructions at home: Lifestyle  Consider keeping a pain diary to share with your health care providers.  Consider talking with a mental health care provider (psychologist) about how to cope with chronic pain.  Consider joining a chronic pain support group.  Try to control or lower your stress levels. Talk to your health care provider about strategies to do this. General instructions   Take over-the-counter and prescription medicines only as told by your health care provider.  Follow your treatment plan as told by your health care provider. This may include: ? Gentle, regular exercise. ? Eating a healthy diet that includes foods such as vegetables, fruits, fish, and lean meats. ? Cognitive or behavioral therapy. ? Working with a Adult nurse. ? Meditation or yoga. ? Acupuncture or  massage therapy. ? Aroma, color, light, or sound therapy. ? Local electrical stimulation. ? Shots (injections) of numbing or pain-relieving medicines into the spine or the area of pain.  Check your pain level as told by your health care provider. Ask your health care provider if you should use a pain  scale.  Learn as much as you can about how to manage your chronic pain. Ask your health care provider if an intensive pain rehabilitation program or a chronic pain specialist would be helpful.  Keep all follow-up visits as told by your health care provider. This is important. Contact a health care provider if:  Your pain gets worse.  You have new pain.  You have trouble sleeping.  You have trouble doing your normal activities.  Your pain is not controlled with treatment.  Your have side effects from pain medicine.  You feel weak. Get help right away if:  You lose feeling or have numbness in your body.  You lose control of bowel or bladder function.  Your pain suddenly gets much worse.  You develop shaking or chills.  You develop confusion.  You develop chest pain.  You have trouble breathing or shortness of breath.  You pass out.  You have thoughts about hurting yourself or others. This information is not intended to replace advice given to you by your health care provider. Make sure you discuss any questions you have with your health care provider. Document Released: 04/28/2002 Document Revised: 07/19/2017 Document Reviewed: 01/24/2016 Elsevier Patient Education  2020 [employer]

## 2019-06-02 NOTE — Progress Notes (Signed)
PATIENT: Catherine Hubbard DOB: 08/05/1961  REASON FOR VISIT: follow up HISTORY FROM: patient  Chief Complaint  Patient presents with   Follow-up    Room 1, alone.  MS f/u "worsening"     HISTORY OF PRESENT ILLNESS: Today 06/04/19 Catherine PeppersMary-Jo E Hubbard is a 58 y.o. female here today for follow up for MS. She continues Ocevus infusions. Last infusion was in June. Next is scheduled for December. She continues to have multiple falls with injuries and has been seen in ER twice since last visit in 10/2018. She has a transition in the entry way that is raised and she has tripped over this multiple times. She was seen by PT in the hospital but was not able to afford to continue outpatient. She does not feel that she can afford to have transition in floor redone to avoid height change. She was advised to restart duloxetine for concerns of shooting pains in face and numbness of hands and feet. Initially this helped but she felt that it worsened depression initially. After continuing medication she felt that it did help with pain so she continued. Depression has not worsened and she feels it is stable at this time. She was referred to psychiatry but did not pursue referral. She is in constant pain. She reports pain "all over her body." She has history of gastric sleeve and was told not to take NSAIDs. She does have trouble with brain fog and trouble recalling information.  MRI brain, cervical and thoracic spine showed stable, chronic MS in 10/2018.    HISTORY: (copied from  note on 10/28/2018)  Catherine MinersMary Hubbard is a 58 y.o. woman with MS.    Update 10/28/2018: Yesterday, she went to the kitchen after getting up.  While walking back, she was veering and felt unsteady.   She went back to bed.  A little later she got up and while walking back from her shower, she had a rotational vertigo and she fell.     She hit her head and went to the ED.   There was no LOC before or with the fall.  CT showed a  left parietal hematoma and stable volume loss and white matter changes form MS.     She has shooting pains in her face and numbness in her hands and feet.    No change in bladder and occasional urge incontinence.   She has had stomach pain since an EGD and also had a CT abdomen showing chronic DJD, chronic L1 compression deformity and old pubic rami fracture.  No free fluid or acute change.   She has had gastric sleeve surgery.      In January, copper was low normal at 72.     Update 08/26/2018: Her MS seems mostly stable.   She just had her Ocrevus infusion yesterday.   She tolerated it well and feels better since the infusion.    A few days ago, she fell reaching for her Ipad ona table and hit her forehead requiring stitches.   She still reports pain there.   No LOC.    She has not had any recent episode of syncope (last one summer 2019).  Her gait has done worse the last year, requiring her cane more.  She has occasional stumbles and rare falls.   Balance is off.   Her legs seem mildly weak, maybe right mildly worse.   She has numbness in her legs.    She notes more edema in her  legs.   She is trying to cut back on salt and she sometimes wears compression stockings.  She has urinary frequency with urge incontinence.   Some fecal incontinence.  She has a recent UTI.     Update 03/27/2018: She had syncope 2 months ago.  She was looking out the window and then woke up on the floor with her boss looking over her.    She reportedly hit her head on her way down.   She is not sure she lost consciousness but others toild her she did for just a few seconds and woke back up as soon as someone touched her head.    She also tripped 03/02/2018 and broke 2 ribs on her right when she slipped on 'goose crap'.    She was taken to [facility] and was admitted.   Of note, she had GI issues and was dehydrated and also had a UTI, hyponatemia, hypokalemia and mild hypoglycemia.     She is trying to do better with fluids.      Echo showed LVH and grade 1 diastolic dysfunction.  She feels her MS is mostly stable.   She is on Ocrevus (last one June, next one December 2019).   She stopped many of the symptomatic medications   Gait is ok.   She wears flats as low heels may have increased fall risk.  She reports fatigue but feels better than she did a couple months ago.  She is sleeping ok most nights.   She notes urinary urgency but not bad enough to take a medication for it.   Update 01/20/2018: She has been on ocrelizumab and just had her second dose of the medication.   She has tolerated it well and has not had any exacerbations.    She feels stable for the most part but has had more falls, usually due to stumbling or tripping.   Gait does not seem that different to her.    She denies any change in strength or sensation.  She is noting urinary frequency with some burning and is concerned about a urinary tract infection.  She has had these in the past.  She gets LBP helped by muscle relaxants.  The pain is midline and radiates to the proximal legs.  She also has pain in her shoulders and arms.   She notes spasms in hr neck.       She has esophagitis and is supposed to have a stretch procedure soon.      She also has a h/o gastric bypass  Update 07/22/2017:   She had her first dose of ocrelizumab in November. She tolerated it well and there were no complications. Her next infusion will be at the end of April. For the most part, she feels the MS has been stable. However, she did note some word finding difficulties last week. This is better this week.    She felt mildly stronger since the infusion.   She feels off balanced, especially if she turns fast or someone bumps into her.   She needs to hold on in the shower.  Her right leg is weaker than her left and sometimes has a foot drop, esp if walking longer distances.     She is doing Geophysicist/field seismologist yoga.   She notes spasticity in legs at night, helped by valium.     Late last month,  she noted a few days where she had trouble coming up with the right word.  She feels she is doing better than last week ans almost back to baseline.    Her bladder is about the same.     Vision is mostly stable.   She lost her prescription glasses and uses readers to read.    She notes pain, mostly in the mid back.   Yoga has not helped the pain much.    She notes sometimes bruising more easily, especially after scratching.    Mood is doing ok, helped by duloxetine and Wellbutrin.   Drinking is better.       From 10/09/2016:  MS:   She is on Rebif and tolerates it well.    However, her last injection was 08/17/2016 and she has not yet gotten refill.  She denies any definite exacerbations but she feels balance, footdrop and writing are worse.   Gait/strength/sensation:   She notes a right foot drop and over last year the left leg is also bothering her more.   She reports poor balance .   She does not need a cane.  She has spasticity in both legs, worse on the right and she sometimes gets spasms that are worse at night.      Sometimes she feels like she is being pushed to the right as she walks.   She has tingling in her hands and face.  Baclofen has helped spasticity some.   Cyclobenzaprine has not helped spasms.     Bladder:   She reports urinary frequency and occasional incontinence.   She feels bladder function is worse with recent bladder infection.   stable.,  She was placed on Cephalexin and then switched to doxycycline.   She felt she did better at first on cephalexin but then did worse again.   Cx and Sensitivity shows E. Coli resistance to cephalosporines (also to Bactrim and Cipro) and she is sensitive to tetracycline and nitrofurantoin and gentamicin and meropenem.       Vision:swallow:   She feels this is stable.   She has diplopia laying on either side but not when sitting up.   Also some she reads a while or when very tired.     Dysphagia:   She notes  dysphagia, especially with 'sticky' pills.   With EGD, she had some capsules stucjk in esophagus and some med's were discontinued.    Strictures are being stretched.    Mood/EtOH/Cognition:   Cymbalta was stopped and she was placed on Wellbutrin and Prozac but is doing worse.   The switch was made due to difficulty swallowing.    She still sometimes feels moderately depressed.     She has some apathy.   She drinks less alcohol than last year   She has mild difficulty with cognition, mostly occasional difficulty with short-term memory and verbal fluency (often gets syllables reversed).  She has trouble completing complicated tasks.  .  Fatigue:   She has fatigue and takes Provigil but it was stopped.  She was tried on Ritalin or Adderall once but she did not like the way that she felt when she took it.    Sleep:   She is sleeping poorly due to more pain.   She has some RLS but has not taken anything in maonths for it.   She notes difficulty falling asleep more than with sleep maintenance.   MS History:   She was diagnosed with multiple sclerosis in 2009. Before that time, she had noted some gait ataxia but had not had any imaging  studies. In 2009, due to progressive hearing loss, she had an MRI of the brain and  then had a lumbar puncture that also confirmed the diagnosis.  I have some of her early MRI reports there are several foci noted in the cervical and thoracic spinal cord in the brain, there were multiple hyperintense foci, mostly in the periventricular deep white matter many of these were contrast enhancing on 07/26/2008. There was another enhancing lesion adjacent to C2 in the spinal cord.  Initially, she was placed on Avonex. She did not feel good and she stopped. She then tried Copaxone but also stopped after a while. She moved here in 2014 and saw Dr. Shelia Media at [facility]. She was placed on Rebif. She tolerates Rebif well.      MRI reports from 12/04/2012  showed multiple foci in the spinal  cord at C2, C3-C4, and C4-C5. There were also foci at T4, T7, T9 and T10. The MRI of the brain showed 2 nonenhancing foci not present from studies in 2011. She reports having an MRI at [facility] 2015 but we do not have those films.  REVIEW OF SYSTEMS: Out of a complete 14 system review of symptoms, the patient complains only of the following symptoms, chronic pain, gait difficulty, numbness, weakness and all other reviewed systems are negative.   ALLERGIES: Allergies  Allergen Reactions   Ibuprofen Other (See Comments)    Cannot take due to gastric bypass   Nsaids Other (See Comments)    Gastric bypass    HOME MEDICATIONS: Outpatient Medications Prior to Visit  Medication Sig Dispense Refill   Cholecalciferol (D2000 ULTRA STRENGTH) 50 MCG (2000 UT) CAPS Take 1 capsule by mouth daily.      DULoxetine (CYMBALTA) 60 MG capsule Take 1 capsule (60 mg total) by mouth daily. 30 capsule 11   modafinil (PROVIGIL) 200 MG tablet TAKE 1 TABLET BY MOUTH EVERY DAY (Patient taking differently: Take 200 mg by mouth daily. ) 30 tablet 5   Multiple Vitamin (MULTIVITAMIN WITH MINERALS) TABS tablet Take 1 tablet by mouth daily.     ondansetron (ZOFRAN-ODT) 8 MG disintegrating tablet TAKE 1 TABLET (8 MG TOTAL) BY MOUTH EVERY 8 (EIGHT) HOURS AS NEEDED FOR NAUSEA OR VOMITING.     acetaminophen (TYLENOL) 325 MG tablet Take 2 tablets (650 mg total) by mouth every 6 (six) hours as needed for mild pain.     olmesartan (BENICAR) 5 MG tablet Take 2 tablets (10 mg total) by mouth daily. 60 tablet 1   omeprazole (PRILOSEC) 20 MG capsule Take 2 capsules (40 mg total) by mouth 2 (two) times daily. 60 capsule 2   No facility-administered medications prior to visit.     PAST MEDICAL HISTORY: Past Medical History:  Diagnosis Date   Alcohol abuse    Asthma    Esophagitis    GERD (gastroesophageal reflux disease)    Headache    Hearing loss    Hypertension    Multiple sclerosis ([place])     Pancreatitis    Vision abnormalities     PAST SURGICAL HISTORY: Past Surgical History:  Procedure Laterality Date   ANKLE FRACTURE SURGERY Bilateral    CARPAL TUNNEL RELEASE Bilateral    CHOLECYSTECTOMY     COLONOSCOPY  11/2016   multiple   ESOPHAGOGASTRODUODENOSCOPY  11/2016   ESOPHAGOGASTRODUODENOSCOPY N/A 02/01/2017   Procedure: ESOPHAGOGASTRODUODENOSCOPY (EGD);  Surgeon: Gatha Mayer, MD;  Location: [facility] ENDOSCOPY;  Service: Endoscopy;  Laterality: N/A;   ESOPHAGOGASTRODUODENOSCOPY (EGD) WITH PROPOFOL  N/A 02/04/2017   Procedure: ESOPHAGOGASTRODUODENOSCOPY (EGD) WITH PROPOFOL;  Surgeon: Meryl DareStark, Malcolm T, MD;  [facility];  Service: Endoscopy;  Laterality: N/A;   LAPAROSCOPIC GASTRIC SLEEVE RESECTION  2011   ULNAR NERVE REPAIR Bilateral     FAMILY HISTORY: Family History  Problem Relation Age of Onset   Cancer Mother    Stroke Mother    Cancer Father     SOCIAL HISTORY: Social History   Socioeconomic History   Marital status: Married    Spouse name: Not on file   Number of children: Not on file   Years of education: Not on file   Highest education level: Not on file  Occupational History   Not on file  Social Needs   Financial resource strain: Not on file   Food insecurity    Worry: Not on file    Inability: Not on file   Transportation needs    Medical: Not on file    Non-medical: Not on file  Tobacco Use   Smoking status: Never Smoker   Smokeless tobacco: Never Used   Tobacco comment: second hand smoker when young. Heavy exposure.  Substance and Sexual Activity   Alcohol use: Yes    Comment: Sts. she drinks beer, liquor daily, varying amts/fim   Drug use: No   Sexual activity: Not Currently  Lifestyle   Physical activity    Days per week: Not on file    Minutes per session: Not on file   Stress: Not on file  Relationships   Social connections    Talks on phone: Not on file    Gets together: Not on file     Attends religious service: Not on file    Active member of club or organization: Not on file    Attends meetings of clubs or organizations: Not on file    Relationship status: Not on file   Intimate partner violence    Fear of current or ex partner: Not on file    Emotionally abused: Not on file    Physically abused: Not on file    Forced sexual activity: Not on file  Other Topics Concern   Not on file  Social History Narrative   Not on file      PHYSICAL EXAM  Vitals:   06/02/19 1510  BP: 104/80  Pulse: 68  Temp: 98.2 F (36.8 C)  Weight: 163 lb (73.9 kg)  Height: 5\' 3"  (1.6 m)   Body mass index is 28.87 kg/m.  Generalized: Well developed, in no acute distress  Cardiology: normal rate and rhythm, no murmur noted Neurological examination  Mentation: Alert oriented to time, place, history taking. Follows all commands speech and language fluent Cranial nerve II-XII: Pupils were equal round reactive to light. Extraocular movements were full, visual field were full on confrontational test. Facial sensation and strength were normal. Uvula tongue midline. Head turning and shoulder shrug  were normal and symmetric. Motor: The motor testing reveals 5 over 5 strength of all 4 extremities. Good symmetric motor tone is noted throughout.  Sensory: Sensory testing is intact to soft touch on all 4 extremities. No evidence of extinction is noted.  Coordination: Cerebellar testing reveals good finger-nose-finger and heel-to-shin bilaterally.  Gait and station: Gait is mildly wide but stable. Romberg is negative. No drift is seen.  Reflexes: Deep tendon reflexes are symmetric and normal bilaterally.   DIAGNOSTIC DATA (LABS, IMAGING, TESTING) - I reviewed patient records, labs, notes, testing and imaging myself where  available.  No flowsheet data found.   Lab Results  Component Value Date   WBC 5.4 06/02/2019   HGB 11.1 06/02/2019   HCT 32.5 (L) 06/02/2019   MCV 94 06/02/2019    PLT 290 06/02/2019      Component Value Date/Time   NA 140 06/02/2019 1549   K 5.4 (H) 06/02/2019 1549   CL 98 06/02/2019 1549   CO2 28 06/02/2019 1549   GLUCOSE 85 06/02/2019 1549   GLUCOSE 97 04/14/2019 0727   BUN 15 06/02/2019 1549   CREATININE 0.76 06/02/2019 1549   CREATININE 0.62 11/27/2018 1505   CALCIUM 9.7 06/02/2019 1549   PROT 6.8 06/02/2019 1549   ALBUMIN 4.3 06/02/2019 1549   AST 29 06/02/2019 1549   ALT 13 06/02/2019 1549   ALKPHOS 114 06/02/2019 1549   BILITOT 0.5 06/02/2019 1549   GFRNONAA 87 06/02/2019 1549   GFRAA 100 06/02/2019 1549   No results found for: CHOL, HDL, LDLCALC, LDLDIRECT, TRIG, CHOLHDL No results found for: ZOXW9U Lab Results  Component Value Date   VITAMINB12 1,149 (H) 08/28/2018   Lab Results  Component Value Date   TSH 0.973 08/26/2018       ASSESSMENT AND PLAN 58 y.o. year old female  has a past medical history of Alcohol abuse, Asthma, Esophagitis, GERD (gastroesophageal reflux disease), Headache, Hearing loss, Hypertension, Multiple sclerosis (HCC), Pancreatitis, and Vision abnormalities. here with     ICD-10-CM   1. Multiple sclerosis (HCC)  G35 CMP    CBC with Differential/Platelets    Ambulatory referral to Pain Clinic  2. Depression with anxiety  F41.8   3. High risk medication use  Z79.899   4. Ataxic gait  R26.0   5. Other fatigue  R53.83   6. Degenerative disc disease, lumbar  M51.36 Ambulatory referral to Pain Clinic  7. Other chronic pain  G89.29 Ambulatory referral to Pain Clinic    Alliene is doing fairly well overall from an MS perspective.  She is tolerating Ocrevus infusions well.  We will update lab work today.  We have discussed multiple chronic pain concerns as well as anxiety and depression.  I have advised that she follow up with PCP regarding anxiety.  I will place a pain management referral today for concerns of degenerative disc disease.  We have also discussed continuing physical therapy.  Fall  precautions given.  She will follow-up with Dr. Purvis Sheffield in 6 months, sooner if needed.  She verbalizes understanding and agreement with this plan.   Orders Placed This Encounter  Procedures   CMP   CBC with Differential/Platelets   Ambulatory referral to Pain Clinic    Referral Priority:   Routine    Referral Type:   Consultation    Referral Reason:   Specialty Services Required    Requested Specialty:   Pain Medicine    Number of Visits Requested:   1     No orders of the defined types were placed in this encounter.     I spent 15 minutes with the patient. 50% of this time was spent counseling and educating patient on plan of care and medications.    Shawnie Dapper, FNP-C 06/04/2019, 10:33 AM [facility] [address], [address] [place], [place] [address] [phone]

## 2019-06-03 ENCOUNTER — Telehealth: Payer: Self-pay | Admitting: *Deleted

## 2019-06-03 LAB — CBC WITH DIFFERENTIAL/PLATELET
Basophils Absolute: 0.1 10*3/uL (ref 0.0–0.2)
Basos: 1 %
EOS (ABSOLUTE): 0.3 10*3/uL (ref 0.0–0.4)
Eos: 5 %
Hematocrit: 32.5 % — ABNORMAL LOW (ref 34.0–46.6)
Hemoglobin: 11.1 g/dL (ref 11.1–15.9)
Immature Grans (Abs): 0 10*3/uL (ref 0.0–0.1)
Immature Granulocytes: 0 %
Lymphocytes Absolute: 1.3 10*3/uL (ref 0.7–3.1)
Lymphs: 25 %
MCH: 32 pg (ref 26.6–33.0)
MCHC: 34.2 g/dL (ref 31.5–35.7)
MCV: 94 fL (ref 79–97)
Monocytes Absolute: 0.9 10*3/uL (ref 0.1–0.9)
Monocytes: 17 %
Neutrophils Absolute: 2.8 10*3/uL (ref 1.4–7.0)
Neutrophils: 52 %
Platelets: 290 10*3/uL (ref 150–450)
RBC: 3.47 x10E6/uL — ABNORMAL LOW (ref 3.77–5.28)
RDW: 13.4 % (ref 11.7–15.4)
WBC: 5.4 10*3/uL (ref 3.4–10.8)

## 2019-06-03 LAB — COMPREHENSIVE METABOLIC PANEL
ALT: 13 IU/L (ref 0–32)
AST: 29 IU/L (ref 0–40)
Albumin/Globulin Ratio: 1.7 (ref 1.2–2.2)
Albumin: 4.3 g/dL (ref 3.8–4.9)
Alkaline Phosphatase: 114 IU/L (ref 39–117)
BUN/Creatinine Ratio: 20 (ref 9–23)
BUN: 15 mg/dL (ref 6–24)
Bilirubin Total: 0.5 mg/dL (ref 0.0–1.2)
CO2: 28 mmol/L (ref 20–29)
Calcium: 9.7 mg/dL (ref 8.7–10.2)
Chloride: 98 mmol/L (ref 96–106)
Creatinine, Ser: 0.76 mg/dL (ref 0.57–1.00)
GFR calc Af Amer: 100 mL/min/{1.73_m2} (ref >59)
GFR calc non Af Amer: 87 mL/min/{1.73_m2} (ref >59)
Globulin, Total: 2.5 g/dL (ref 1.5–4.5)
Glucose: 85 mg/dL (ref 65–99)
Potassium: 5.4 mmol/L — ABNORMAL HIGH (ref 3.5–5.2)
Sodium: 140 mmol/L (ref 134–144)
Total Protein: 6.8 g/dL (ref 6.0–8.5)

## 2019-06-03 NOTE — Telephone Encounter (Signed)
LVM requesting call back for lab results.  

## 2019-06-03 NOTE — Telephone Encounter (Signed)
Patient returned call and I advised that her labs are stable. Her potassium read high. This could be due to complications with the blood sample. She stated she is no longer taking potassium. I advised her the NP would have her repeat lab with PCP to verify reading. She stated she will do that. She then asked if NP has talked to Dr Felecia Shelling about a medication they discussed in her office visit. I advised her I don't see any mention in her note, so I will send this back to NP, and she'll get a call later. She verbalized understanding, appreciation.

## 2019-06-04 ENCOUNTER — Encounter: Payer: Self-pay | Admitting: Family Medicine

## 2019-06-04 NOTE — Telephone Encounter (Signed)
Yes. Please let her know that we do not have any recommendations at this time. I have sent pain management referral. She may also talk with PCP.

## 2019-06-04 NOTE — Telephone Encounter (Signed)
LVM informing patient that Catherine Hubbard advised she doesn't have any further recommendations at this time. She has sent in referral for pain clinic, and patient may discuss with her PCP. Left number for questions.

## 2019-06-14 ENCOUNTER — Encounter (HOSPITAL_BASED_OUTPATIENT_CLINIC_OR_DEPARTMENT_OTHER): Payer: Self-pay | Admitting: Emergency Medicine

## 2019-06-14 ENCOUNTER — Emergency Department (HOSPITAL_BASED_OUTPATIENT_CLINIC_OR_DEPARTMENT_OTHER)
Admission: EM | Admit: 2019-06-14 | Discharge: 2019-06-14 | Disposition: A | Discharge status: Home / Self Care | Payer: Federal, State, Local not specified - PPO | Attending: Emergency Medicine | Admitting: Emergency Medicine

## 2019-06-14 ENCOUNTER — Other Ambulatory Visit: Payer: Self-pay

## 2019-06-14 ENCOUNTER — Emergency Department (HOSPITAL_BASED_OUTPATIENT_CLINIC_OR_DEPARTMENT_OTHER): Payer: Federal, State, Local not specified - PPO

## 2019-06-14 DIAGNOSIS — I10 Essential (primary) hypertension: Secondary | ICD-10-CM | POA: Insufficient documentation

## 2019-06-14 DIAGNOSIS — J45909 Unspecified asthma, uncomplicated: Secondary | ICD-10-CM | POA: Insufficient documentation

## 2019-06-14 DIAGNOSIS — Y999 Unspecified external cause status: Secondary | ICD-10-CM | POA: Diagnosis not present

## 2019-06-14 DIAGNOSIS — F10220 Alcohol dependence with intoxication, uncomplicated: Secondary | ICD-10-CM | POA: Diagnosis not present

## 2019-06-14 DIAGNOSIS — Z886 Allergy status to analgesic agent status: Secondary | ICD-10-CM | POA: Diagnosis not present

## 2019-06-14 DIAGNOSIS — S0121XA Laceration without foreign body of nose, initial encounter: Secondary | ICD-10-CM

## 2019-06-14 DIAGNOSIS — Y908 Blood alcohol level of 240 mg/100 ml or more: Secondary | ICD-10-CM | POA: Insufficient documentation

## 2019-06-14 DIAGNOSIS — Y92008 Other place in unspecified non-institutional (private) residence as the place of occurrence of the external cause: Secondary | ICD-10-CM | POA: Diagnosis not present

## 2019-06-14 DIAGNOSIS — S0083XA Contusion of other part of head, initial encounter: Secondary | ICD-10-CM | POA: Diagnosis not present

## 2019-06-14 DIAGNOSIS — Z79899 Other long term (current) drug therapy: Secondary | ICD-10-CM | POA: Diagnosis not present

## 2019-06-14 DIAGNOSIS — W19XXXA Unspecified fall, initial encounter: Secondary | ICD-10-CM | POA: Diagnosis not present

## 2019-06-14 DIAGNOSIS — Y9389 Activity, other specified: Secondary | ICD-10-CM | POA: Diagnosis not present

## 2019-06-14 DIAGNOSIS — Y92009 Unspecified place in unspecified non-institutional (private) residence as the place of occurrence of the external cause: Secondary | ICD-10-CM

## 2019-06-14 DIAGNOSIS — S0990XA Unspecified injury of head, initial encounter: Secondary | ICD-10-CM | POA: Diagnosis not present

## 2019-06-14 DIAGNOSIS — G35 Multiple sclerosis: Secondary | ICD-10-CM | POA: Diagnosis not present

## 2019-06-14 LAB — ETHANOL: Alcohol, Ethyl (B): 322 mg/dL (ref <10)

## 2019-06-14 MED ORDER — TETANUS-DIPHTH-ACELL PERTUSSIS 5-2.5-18.5 LF-MCG/0.5 IM SUSP: 0.5000 mL | Freq: Once | INTRAMUSCULAR | Status: DC

## 2019-06-14 MED ORDER — ACETAMINOPHEN 325 MG PO TABS
650.0000 mg | ORAL_TABLET | Freq: Once | ORAL | Status: AC
  Administered 2019-06-14: 03:00:00 650 mg via ORAL
  Filled 2019-06-14: qty 2

## 2019-06-14 MED ORDER — VITAMIN B-1 100 MG PO TABS
100.0000 mg | ORAL_TABLET | Freq: Once | ORAL | Status: AC
  Administered 2019-06-14: 100 mg via ORAL
  Filled 2019-06-14: qty 1

## 2019-06-14 MED ORDER — FOLIC ACID 1 MG PO TABS
1.0000 mg | ORAL_TABLET | Freq: Once | ORAL | Status: AC
  Administered 2019-06-14: 1 mg via ORAL
  Filled 2019-06-14: qty 1

## 2019-06-14 NOTE — ED Triage Notes (Signed)
Pt arrives after a fall at home around 2200 that caused hematoma to L forehead and laceration to nose. Pt unaware of cause of fall. Hx ETOH abuse and multiple falls. Denies syncope or injury to any other of body.

## 2019-06-14 NOTE — ED Notes (Signed)
Pt requesting tylenol for pain, EDP notified.

## 2019-06-14 NOTE — ED Notes (Signed)
Provided update to patient's spouse with her consent

## 2019-06-14 NOTE — ED Provider Notes (Signed)
[facility] Provider Note: Catherine Hubbard Catherine Zapanta, MD, FACEP  CSN: [id number] MRN: [id number] ARRIVAL: 06/14/19 at 0127 ROOM: MH10/MH10   CHIEF COMPLAINT  Head Injury   HISTORY OF PRESENT ILLNESS  06/14/19 1:47 AM Catherine Hubbard is a 58 y.o. female with history of alcohol abuse, multiple sclerosis and frequent falls.  She fell after getting up from a chair to go to bed about 10 PM yesterday evening.  She struck her face.  She has multiple bruises to her face and a laceration to the bridge of her nose.  She rates associated pain is a 9 out of 10, worse with palpation.  She denies injury elsewhere.  She has not been vomiting.  She did not lose consciousness.  She has chronic edema of her lower legs which is less prominent than usual.   Past Medical History:  Diagnosis Date  . Alcohol abuse   . Asthma   . Esophagitis   . GERD (gastroesophageal reflux disease)   . Headache   . Hearing loss   . Hypertension   . Multiple sclerosis (HCC)   . Pancreatitis   . Vision abnormalities     Past Surgical History:  Procedure Laterality Date  . ANKLE FRACTURE SURGERY Bilateral   . CARPAL TUNNEL RELEASE Bilateral   . CHOLECYSTECTOMY    . COLONOSCOPY  11/2016   multiple  . ESOPHAGOGASTRODUODENOSCOPY  11/2016  . ESOPHAGOGASTRODUODENOSCOPY N/A 02/01/2017   Procedure: ESOPHAGOGASTRODUODENOSCOPY (EGD);  Surgeon: Iva BoopGessner, Carl E, MD;  [facility];  Service: Endoscopy;  Laterality: N/A;  . ESOPHAGOGASTRODUODENOSCOPY (EGD) WITH PROPOFOL N/A 02/04/2017   Procedure: ESOPHAGOGASTRODUODENOSCOPY (EGD) WITH PROPOFOL;  Surgeon: Meryl DareStark, Malcolm T, MD;  [facility];  Service: Endoscopy;  Laterality: N/A;  . LAPAROSCOPIC GASTRIC SLEEVE RESECTION  2011  . ULNAR NERVE REPAIR Bilateral     Family History  Problem Relation Age of Onset  . Cancer Mother   . Stroke Mother   . Cancer Father     Social History   Tobacco Use  . Smoking status: Never Smoker  . Smokeless  tobacco: Never Used  . Tobacco comment: second hand smoker when young. Heavy exposure.  Substance Use Topics  . Alcohol use: Yes    Comment: Sts. she drinks beer, liquor daily, varying amts/fim  . Drug use: No    Prior to Admission medications   Medication Sig Start Date End Date Taking? Authorizing Provider  baclofen (LIORESAL) 10 MG tablet Take 10 mg by mouth 3 (three) times daily. 06/01/19   [provider]  Cholecalciferol (D2000 ULTRA STRENGTH) 50 MCG (2000 UT) CAPS Take 1 capsule by mouth daily.     [provider]  DULoxetine (CYMBALTA) 60 MG capsule Take 1 capsule (60 mg total) by mouth daily. 08/26/18   Sater, Pearletha Furlichard A, MD  modafinil (PROVIGIL) 200 MG tablet TAKE 1 TABLET BY MOUTH EVERY DAY Patient taking differently: Take 200 mg by mouth daily.  01/05/19   Sater, Pearletha Furlichard A, MD  Multiple Vitamin (MULTIVITAMIN WITH MINERALS) TABS tablet Take 1 tablet by mouth daily.    [provider]  ondansetron (ZOFRAN-ODT) 8 MG disintegrating tablet TAKE 1 TABLET (8 MG TOTAL) BY MOUTH EVERY 8 (EIGHT) HOURS AS NEEDED FOR NAUSEA OR VOMITING. 05/04/19   [provider]  albuterol (PROVENTIL) (2.5 MG/3ML) 0.083% nebulizer solution Take 3 mLs (2.5 mg total) by nebulization every 6 (six) hours as needed for wheezing or shortness of breath. Patient not taking: Reported on 01/29/2019 10/31/18 03/07/19  Saguier, Ramon Dredge, PA-C  furosemide (LASIX) 20 MG tablet Take 1 tablet (20 mg total) by mouth daily. Patient not taking: Reported on 01/29/2019 01/05/19 03/07/19  Benjiman Core, MD  potassium chloride SA (K-DUR) 20 MEQ tablet Take 1 tablet (20 mEq total) by mouth 2 (two) times daily. Patient not taking: Reported on 01/29/2019 01/05/19 03/07/19  Benjiman Core, MD    Allergies Ibuprofen and Nsaids   REVIEW OF SYSTEMS  Negative except as noted here or in the History of Present Illness.   PHYSICAL EXAMINATION  Initial Vital Signs Blood pressure 135/85, pulse 92,  temperature 98.3 F (36.8 C), resp. rate 18, height 5\' 3"  (1.6 m), last menstrual period 08/13/2014, SpO2 97 %.  Examination General: Well-developed, well-nourished female in no acute distress; appearance consistent with age of record HENT: normocephalic; no hemotympanum; multiple ecchymoses with laceration to bridge of nose:    Eyes: pupils equal, round and reactive to light; extraocular muscles intact Neck: supple; nontender Heart: regular rate and rhythm Lungs: clear to auscultation bilaterally Abdomen: soft; nondistended; nontender; bowel sounds present Extremities: No deformity; full range of motion; pulses normal; +1 edema of lower legs Neurologic: Awake, alert; dysarthria; motor function intact in all extremities and symmetric; no facial droop; hard of hearing Skin: Warm and dry Psychiatric: Normal mood and affect   RESULTS  Summary of this visit's results, reviewed and interpreted by myself:   EKG Interpretation  Date/Time:    Ventricular Rate:    PR Interval:    QRS Duration:   QT Interval:    QTC Calculation:   R Axis:     Text Interpretation:        Laboratory Studies: Results for orders placed or performed during the hospital encounter of 06/14/19 (from the past 24 hour(s))  Ethanol     Status: Abnormal   Collection Time: 06/14/19  1:59 AM  Result Value Ref Range   Alcohol, Ethyl (B) 322 (HH) <10 mg/dL   Imaging Studies: Ct Head Wo Contrast  Result Date: 06/14/2019 CLINICAL DATA:  59 year old female EXAM: CT HEAD WITHOUT CONTRAST TECHNIQUE: Contiguous axial images were obtained from the base of the skull through the vertex without intravenous contrast. COMPARISON:  With fall. Head CT dated 04/12/2019 FINDINGS: Brain: Mild age-related atrophy. Moderate to advanced chronic microvascular ischemic changes or white matter disease. There is no acute intracranial hemorrhage. No mass effect or midline shift. No extra-axial fluid collection. Vascular: No hyperdense  vessel or unexpected calcification. Skull: Normal. Negative for fracture or focal lesion. Sinuses/Orbits: No acute finding. Other: Left forehead hematoma. IMPRESSION: 1. No acute intracranial hemorrhage. 2. Age-related atrophy and chronic microvascular ischemic changes. Electronically Signed   By: 04/14/2019 M.D.   On: 06/14/2019 02:54    ED COURSE and MDM  Nursing notes, initial and subsequent vitals signs, including pulse oximetry, reviewed and interpreted by myself.  Vitals:   06/14/19 0143 06/14/19 0145  BP: 135/85   Pulse: 92   Resp: 18   Temp: 98.3 F (36.8 C)   SpO2: 97%   Height:  5\' 3"  (1.6 m)   Medications  Tdap (BOOSTRIX) injection 0.5 mL (0.5 mLs Intramuscular Not Given 06/14/19 0157)  thiamine (VITAMIN B-1) tablet 100 mg (has no administration in time range)  folic acid (FOLVITE) tablet 1 mg (has no administration in time range)  acetaminophen (TYLENOL) tablet 650 mg (has no administration in time range)   Patient advised of blood alcohol level and need to limit her drinking.  PROCEDURES  Procedures  LACERATION REPAIR Performed by: Karen Chafe Jaylah Goodlow Authorized by: Karen Chafe Mechille Varghese Consent: Verbal consent obtained. Risks and benefits: risks, benefits and alternatives were discussed Consent given by: patient Patient identity confirmed: provided demographic data Prepped and Draped in normal sterile fashion Wound explored  Laceration Location: Nose  Laceration Length: 1 cm  No Foreign Bodies seen or palpated  Anesthesia: None Irrigation method: syringe Amount of cleaning: standard  Skin closure: Dermabond  Patient tolerance: Patient tolerated the procedure well with no immediate complications.   ED DIAGNOSES     ICD-10-CM   1. Alcohol intoxication in active alcoholic without complication ([place])  W29.562   2. Fall in home, initial encounter  W19.XXXA    Y92.009   3. Laceration of nose, initial encounter  S01.21XA   4. Contusion of face, initial  encounter  S00.83XA        Shanon Rosser, MD 06/14/19 [phone]

## 2019-06-29 ENCOUNTER — Encounter
Payer: Federal, State, Local not specified - PPO | Attending: Physical Medicine and Rehabilitation | Admitting: Physical Medicine and Rehabilitation

## 2019-06-29 ENCOUNTER — Other Ambulatory Visit: Payer: Self-pay

## 2019-06-29 ENCOUNTER — Encounter: Payer: Self-pay | Admitting: Physical Medicine and Rehabilitation

## 2019-06-29 VITALS — BP 165/116 | HR 81 | Temp 97.9°F | Ht 65.0 in | Wt 168.0 lb

## 2019-06-29 DIAGNOSIS — R5382 Chronic fatigue, unspecified: Secondary | ICD-10-CM | POA: Insufficient documentation

## 2019-06-29 DIAGNOSIS — G8929 Other chronic pain: Secondary | ICD-10-CM | POA: Diagnosis not present

## 2019-06-29 DIAGNOSIS — M545 Low back pain: Secondary | ICD-10-CM | POA: Diagnosis not present

## 2019-06-29 DIAGNOSIS — G894 Chronic pain syndrome: Secondary | ICD-10-CM | POA: Insufficient documentation

## 2019-06-29 DIAGNOSIS — G9332 Myalgic encephalomyelitis/chronic fatigue syndrome: Secondary | ICD-10-CM

## 2019-06-29 MED ORDER — METHYLPHENIDATE HCL 5 MG PO TABS: 5.0000 mg | ORAL_TABLET | Freq: Every day | ORAL | 0 refills | Status: DC

## 2019-06-29 NOTE — Patient Instructions (Signed)
Start Ritalin every morning to help maximize energy/motivation Keep a daily log of goals and check them off every day Maintain social connections in your life. Continue exercising with your neighbors. Walk at least 15 minutes every day, preferably outside.  Look for local organizations and programs that can add meaning and friendship to your life.

## 2019-06-29 NOTE — Progress Notes (Addendum)
Subjective:    Patient ID: Catherine Hubbard, female    DOB: October 25, 1960, 58 y.o.   MRN: [id number]  HPI   Catherine Hubbard is a 58 y.o. female with history of alcohol abuse, multiple sclerosis and frequent falls, presenting as a new patient to our practice today.  Recent visit to the ED on 10/25: She fell after getting up from a chair to go to bed on 10/24 and was taken to the ED.  She struck her face during the fall and was found to have multiple bruises to her face and a laceration to the bridge of her nose.  She did not lose consciousness.    She believes her fall was due to her right leg weakness. She has been to PT before and knows what exercises to do, but her pain limits her ability to perform them. She cannot describe where her pan is worse. She does state that she often sleeps only once or twice per night. She has been started on Modafinil by neurology to help increase her energy and is not sure if it helps.   She is saddened by her relationship with her husband, whom she states uses the computer all day playing games with his friend from [place]. He only stops to have meals. She has tried to reach out to him to exercise with her to work out videos, to walk in the mall, but he is disinterested. She says she has given up on him. She has reached out to her neighbors to walk together and to start doing exercise videos together. She is a social person and needs to be with other people.   Pain Inventory Average Pain 8 Pain Right Now 8 My pain is constant  In the last 24 hours, has pain interfered with the following? General activity 10 Relation with others 10 Enjoyment of life 10 What TIME of day is your pain at its worst? all Sleep (in general) Poor  Pain is worse with: walking, bending, sitting and standing Pain improves with: pacing activities and TENS Relief from Meds: no pain medication  Mobility walk with assistance use a walker ability to climb steps?  no  do you drive?  yes  Function I need assistance with the following:  dressing and household duties  Neuro/Psych bladder control problems bowel control problems weakness numbness tremor tingling trouble walking spasms dizziness confusion depression anxiety loss of taste or smell  Prior Studies new  Physicians involved in your care new   Family History  Problem Relation Age of Onset  . Cancer Mother   . Stroke Mother   . Cancer Father    Social History   Socioeconomic History  . Marital status: Married    Spouse name: Not on file  . Number of children: Not on file  . Years of education: Not on file  . Highest education level: Not on file  Occupational History  . Not on file  Social Needs  . Financial resource strain: Not on file  . Food insecurity    Worry: Not on file    Inability: Not on file  . Transportation needs    Medical: Not on file    Non-medical: Not on file  Tobacco Use  . Smoking status: Never Smoker  . Smokeless tobacco: Never Used  . Tobacco comment: second hand smoker when young. Heavy exposure.  Substance and Sexual Activity  . Alcohol use: Yes    Comment: Sts. she drinks beer, liquor daily, varying  amts/fim  . Drug use: No  . Sexual activity: Not Currently  Lifestyle  . Physical activity    Days per week: Not on file    Minutes per session: Not on file  . Stress: Not on file  Relationships  . Social Musician on phone: Not on file    Gets together: Not on file    Attends religious service: Not on file    Active member of club or organization: Not on file    Attends meetings of clubs or organizations: Not on file    Relationship status: Not on file  Other Topics Concern  . Not on file  Social History Narrative  . Not on file   Past Surgical History:  Procedure Laterality Date  . ANKLE FRACTURE SURGERY Bilateral   . CARPAL TUNNEL RELEASE Bilateral   . CHOLECYSTECTOMY    . COLONOSCOPY  11/2016   multiple  .  ESOPHAGOGASTRODUODENOSCOPY  11/2016  . ESOPHAGOGASTRODUODENOSCOPY N/A 02/01/2017   Procedure: ESOPHAGOGASTRODUODENOSCOPY (EGD);  Surgeon: Iva Boop, MD;  [facility] ENDOSCOPY;  Service: Endoscopy;  Laterality: N/A;  . ESOPHAGOGASTRODUODENOSCOPY (EGD) WITH PROPOFOL N/A 02/04/2017   Procedure: ESOPHAGOGASTRODUODENOSCOPY (EGD) WITH PROPOFOL;  Surgeon: Meryl Dare, MD;  Location: [facility] ENDOSCOPY;  Service: Endoscopy;  Laterality: N/A;  . LAPAROSCOPIC GASTRIC SLEEVE RESECTION  2011  . ULNAR NERVE REPAIR Bilateral    Past Medical History:  Diagnosis Date  . Alcohol abuse   . Asthma   . Esophagitis   . GERD (gastroesophageal reflux disease)   . Headache   . Hearing loss   . Hypertension   . Multiple sclerosis (HCC)   . Pancreatitis   . Vision abnormalities    BP (!) 165/116   Pulse 81   Temp 97.9 F (36.6 C)   Ht 5\' 5"  (1.651 m)   Wt 168 lb (76.2 kg)   LMP 08/13/2014 Comment: very few periods  SpO2 96%   BMI 27.96 kg/m   Opioid Risk Score:   Fall Risk Score:  `1  Depression screen PHQ 2/9  Depression screen PHQ 2/9 06/29/2019  Decreased Interest 3  Down, Depressed, Hopeless 3  PHQ - 2 Score 6  Altered sleeping 3  Tired, decreased energy 3  Change in appetite 3  Feeling bad or failure about yourself  3  Trouble concentrating 3  Moving slowly or fidgety/restless 3  Suicidal thoughts 3  PHQ-9 Score 27  Difficult doing work/chores Extremely dIfficult    Review of Systems  Constitutional: Positive for unexpected weight change.  Eyes: Negative.   Cardiovascular: Positive for leg swelling.  Gastrointestinal: Positive for constipation and diarrhea.  Endocrine: Negative.   Genitourinary:       Incontinence  Musculoskeletal: Positive for arthralgias, back pain, gait problem, neck pain and neck stiffness.       Spasms   Skin: Positive for rash.  Allergic/Immunologic: Negative.   Neurological: Positive for tremors, weakness and numbness.       Tingling   Hematological: Bruises/bleeds easily.  Psychiatric/Behavioral: Positive for confusion and dysphoric mood. The patient is nervous/anxious.   All other systems reviewed and are negative.      Objective:   Physical Exam Gen: no distress, normal appearing HEENT: oral mucosa pink and moist, NCAT Cardio: Reg rate Chest: normal effort, normal rate of breathing Abd: soft, non-distended Ext: no edema Skin: intact Neuro/Musculoskeletal: Able to walk in office with walker without falls. Able to stand for 5 minutes during conversation with me.  Able to bend done and touch her toes with FROM. Diffuse tenderness along spine with gentle palpation. 5/5 strength in arms with the exception of 4+5 in right shoulder abduction and elbow flexion. 4-/5 in bilateral legs due to diffuse weakness. No focal motor or sensory deficits.  Range of motion of neck limited to 40 degrees bilateral, tingling in neck reproduced with neck flexion.  Psych: pleasant, normal affect, often loses train of thought during conversation and recognizes this, mildly frustrated about it.     Assessment & Plan:  Catherine Hubbard is a 58 y.o. female with history of alcohol abuse, multiple sclerosis and frequent falls, presenting as a new patient to our practice today.  Mrs. Butler-Taylor has diffuse chronic pain and reduced quality of life due to a lack of purpose and motivation in her life. She is saddened by her relationship with her husband but still motivated to make relationships with her neighbors and to be engaged in life. She is also experiencing increased cognitive deficits, likely due to a combination of MS, chronic pain, and depressed mood.  We discussed the following plan: Start Ritalin 5mg  every morning to help maximize energy/motivation Keep a daily log of goals and check them off every day Maintain social connections in your life. Continue exercising with your neighbors.  Walk at least 15 minutes every day,  preferably outside.  Look for local organizations and programs that can add meaning and friendship to your life.  Make an effort to do activities that you enjoy, such as going to the [employer].  Thirty minutes of face to face patient care time were spent during this visit. All questions were encouraged and answered. Follow up with me in 4 weeks.

## 2019-07-02 ENCOUNTER — Telehealth: Payer: Self-pay | Admitting: *Deleted

## 2019-07-02 NOTE — Telephone Encounter (Signed)
Prior authorization submitted via covermymeds for methylphenidate 5 mg tablets.  Approved 06/02/2019-07/01/2020

## 2019-07-03 IMAGING — CT CT ANGIO CHEST
2 of 8 series · 18 of 36 positions shown · IV contrast (iopamidol)
Comparison: 05/12/2018

CLINICAL DATA: Chest pain, cough, and body aches for 1 week.
History of hypertension, gastroesophageal reflux disease, MS,
asthma.

EXAM:
CT ANGIOGRAPHY CHEST WITH CONTRAST
TECHNIQUE: Multidetector CT imaging of the chest was performed using the
standard protocol during bolus administration of intravenous
contrast. Multiplanar CT image reconstructions and MIPs were
obtained to evaluate the vascular anatomy.
CONTRAST:  76mL 92E08D-5N5 IOPAMIDOL (92E08D-5N5) INJECTION 76%

[Series 6: pe thins · axial · 0.69mm/px · z∈[-175,+74]mm · 17 of 279 slices shown]
[im 15/279  lung]
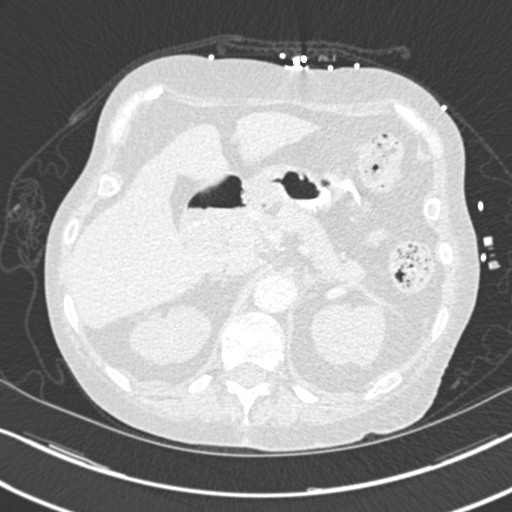
[im 30/279  mediastinal]
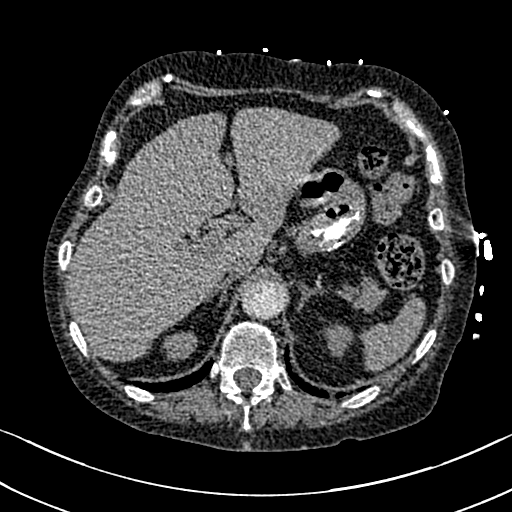
[im 44/279  lung]
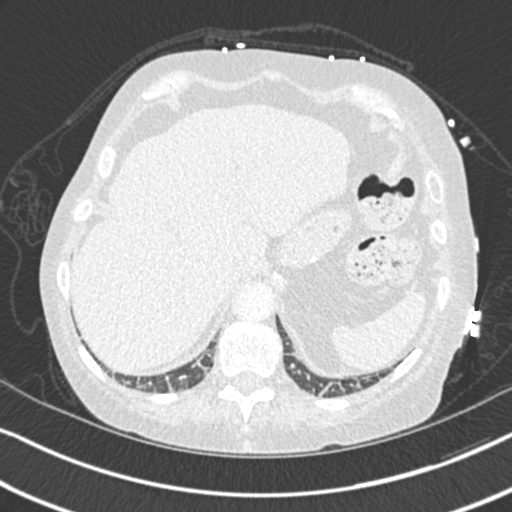
[im 59/279  mediastinal]
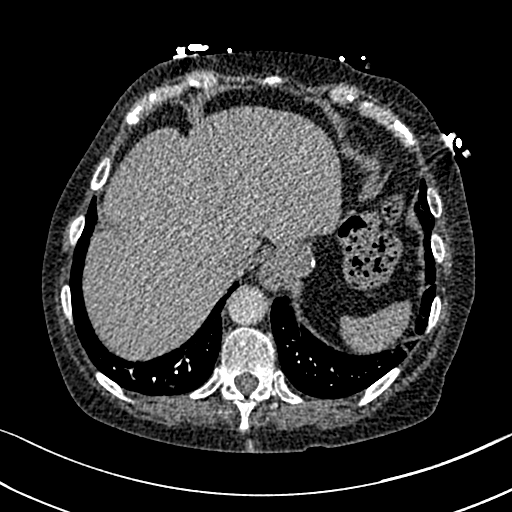
[im 74/279  lung]
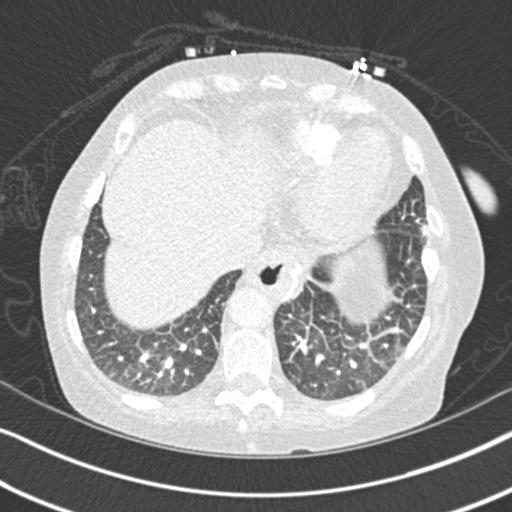
[im 88/279  mediastinal]
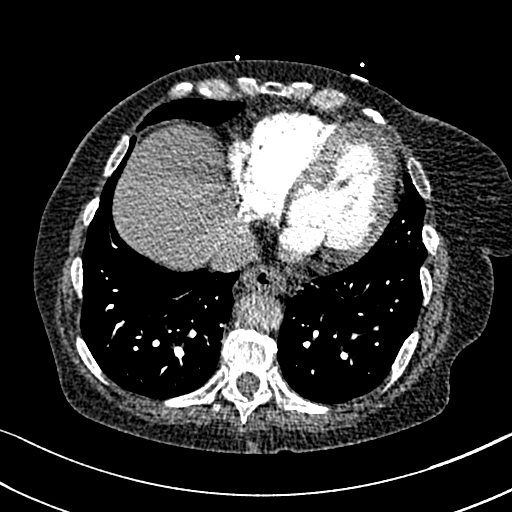
[im 103/279  lung]
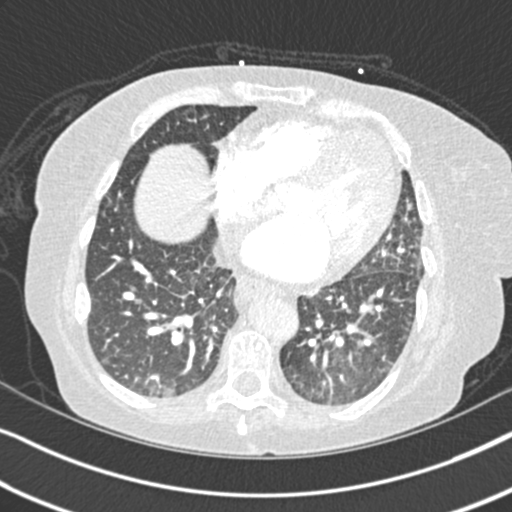
[im 118/279  mediastinal]
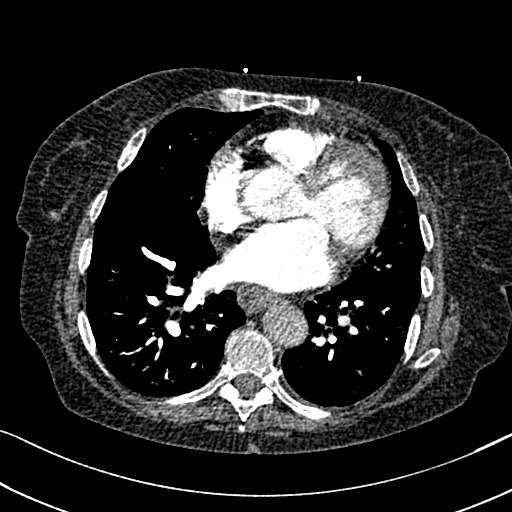
[im 147/279  lung]
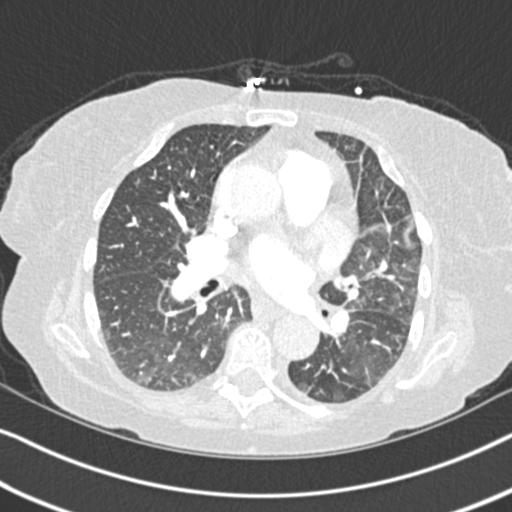
[im 161/279  mediastinal]
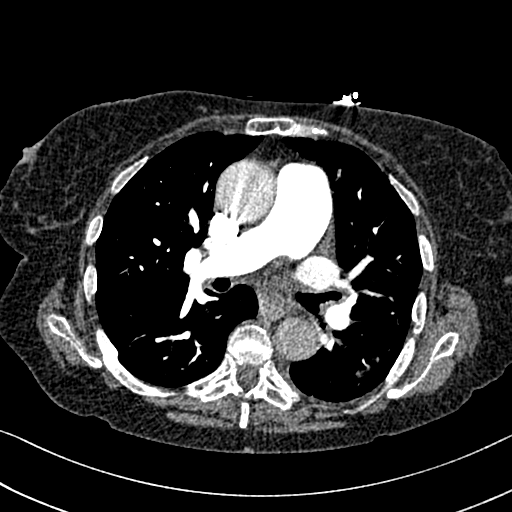
[im 176/279  lung]
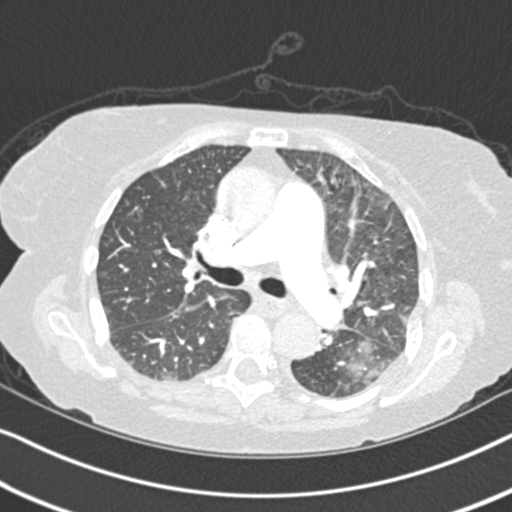
[im 191/279  mediastinal]
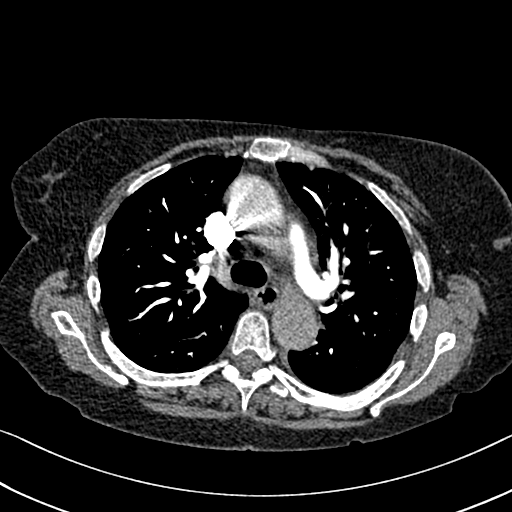
[im 205/279  lung]
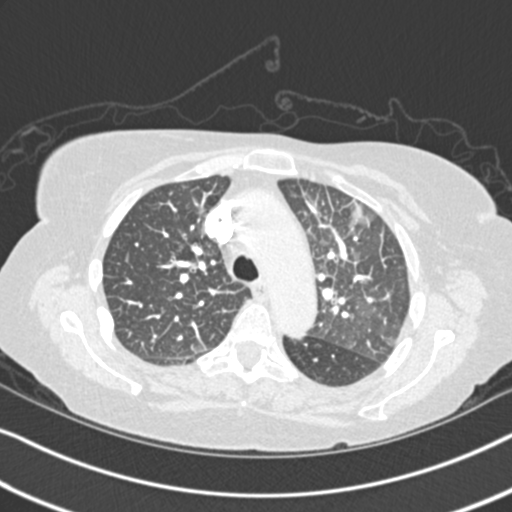
[im 220/279  mediastinal]
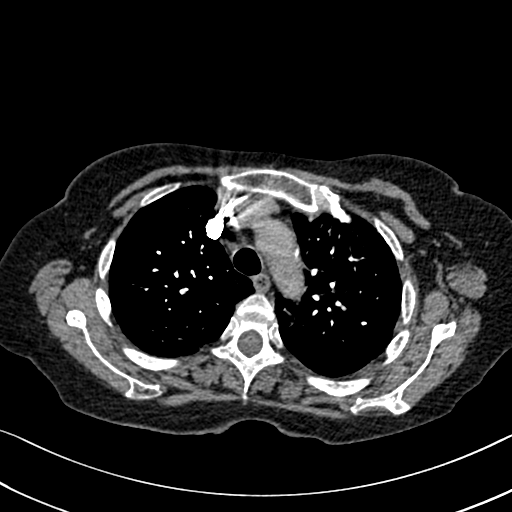
[im 235/279  lung]
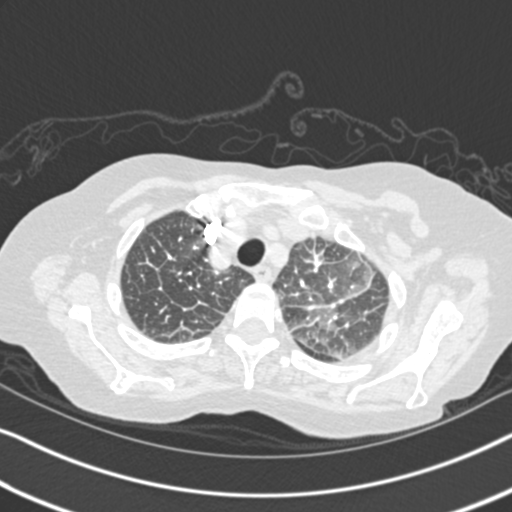
[im 249/279  mediastinal]
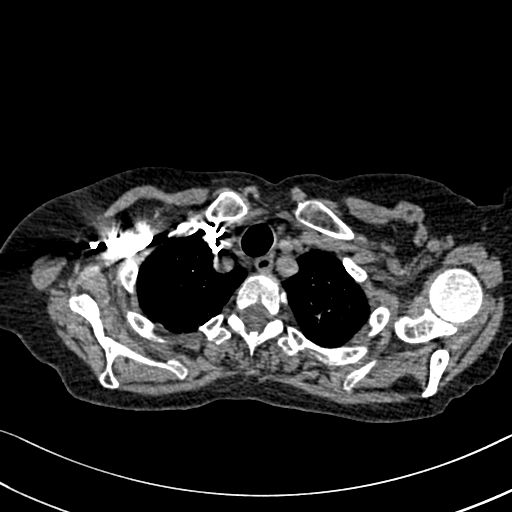
[im 264/279  lung]
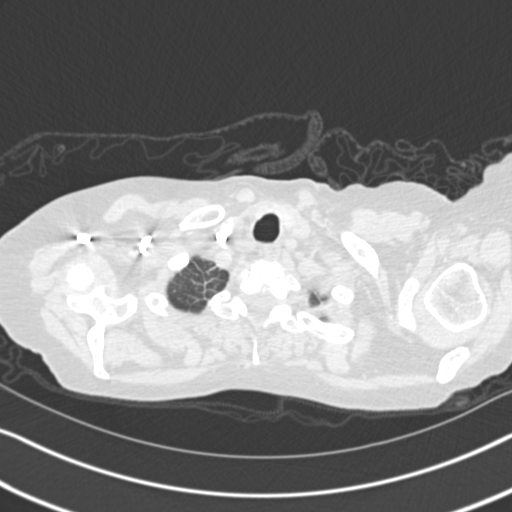

[Series 7: pe coronal mpr · coronal · 0.59mm/px · 1 of 127 slices shown]
[im 64/127  mediastinal]
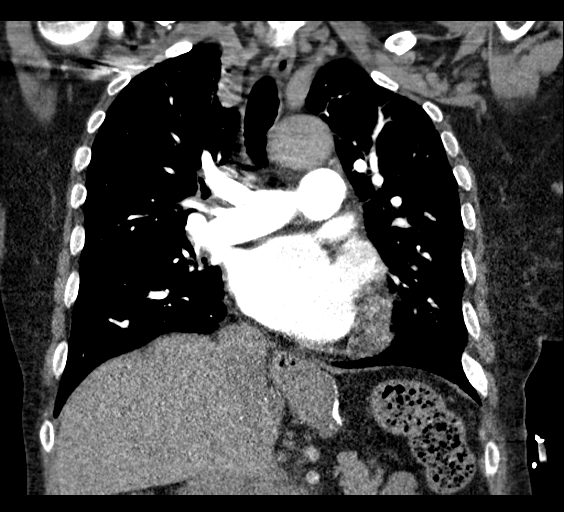

[18 of 36 positions shown; findings below may reference images not displayed]

FINDINGS: Cardiovascular: Good opacification of the central and segmental
pulmonary arteries. No focal filling defects. No evidence of
significant pulmonary embolus. Cardiac enlargement with left heart
prominence. No pericardial effusions. Normal caliber thoracic aorta.
No aortic aneurysm.

Mediastinum/Nodes: Esophagus is decompressed. Small esophageal
hiatal hernia. Postoperative changes consistent with a gastric
sleeve procedure. This is incompletely included. No significant
lymphadenopathy in the chest.

Lungs/Pleura: There is diffuse interstitial pattern to the lungs
with edema or infiltration throughout the interlobular septa.
Additional airspace infiltration throughout the left lung and in the
right lung base. This is most likely to represent edema although
multifocal pneumonia would be another possibility. No pleural
effusions. No pneumothorax. Airways are patent.

Upper Abdomen: No acute abnormalities are demonstrated.

Musculoskeletal: Mild degenerative changes in the spine. Old
bilateral rib fractures.

Review of the MIP images confirms the above findings.
IMPRESSION: 1. No evidence of significant pulmonary embolus.
2. Diffuse interstitial and airspace disease throughout both lungs
most likely to represent edema although multifocal pneumonia would
be another possibility.

## 2019-07-08 ENCOUNTER — Other Ambulatory Visit: Payer: Self-pay | Admitting: Neurology

## 2019-07-08 ENCOUNTER — Other Ambulatory Visit: Payer: Self-pay | Admitting: Medical

## 2019-07-09 ENCOUNTER — Other Ambulatory Visit: Payer: Self-pay

## 2019-07-09 ENCOUNTER — Encounter: Payer: Self-pay | Admitting: Medical

## 2019-07-09 ENCOUNTER — Ambulatory Visit (INDEPENDENT_AMBULATORY_CARE_PROVIDER_SITE_OTHER): Payer: Federal, State, Local not specified - PPO | Admitting: Medical

## 2019-07-09 VITALS — Temp 98.2°F | Ht 65.0 in | Wt 157.0 lb

## 2019-07-09 DIAGNOSIS — J329 Chronic sinusitis, unspecified: Secondary | ICD-10-CM

## 2019-07-09 DIAGNOSIS — J3489 Other specified disorders of nose and nasal sinuses: Secondary | ICD-10-CM

## 2019-07-09 DIAGNOSIS — R05 Cough: Secondary | ICD-10-CM

## 2019-07-09 DIAGNOSIS — R059 Cough, unspecified: Secondary | ICD-10-CM

## 2019-07-09 MED ORDER — BENZONATATE 100 MG PO CAPS: 100.0000 mg | ORAL_CAPSULE | Freq: Three times a day (TID) | ORAL | 0 refills | Status: DC | PRN

## 2019-07-09 MED ORDER — FLUTICASONE PROPIONATE 50 MCG/ACT NA SUSP: 2.0000 | Freq: Every day | NASAL | 1 refills | Status: DC

## 2019-07-09 MED ORDER — DOXYCYCLINE HYCLATE 100 MG PO TABS: 100.0000 mg | ORAL_TABLET | Freq: Two times a day (BID) | ORAL | 0 refills | Status: DC

## 2019-07-09 NOTE — Progress Notes (Signed)
   Subjective:    Patient ID: Catherine Hubbard, female    DOB: April 01, 1961, 58 y.o.   MRN: [id number]  HPI  Virtual Visit via Telephone Note  I connected with Catherine Hubbard on 07/09/19 at  9:40 AM EST by telephone and verified that I am speaking with the correct person using two identifiers.  Location: Patient: home Provider: office   I discussed the limitations, risks, security and privacy concerns of performing an evaluation and management service by telephone and the availability of in person appointments. I also discussed with the patient that there may be a patient responsible charge related to this service. The patient expressed understanding and agreed to proceed.   History of Present Illness: Pt in states she thinks has sinus infection. Last 3 days has runny nose with some colored chunks of mucus. When blows nose states green color to mucus.Pt has pain or maxillary and frontal sinus. No fever, no chills or sweats. Pt has no unusual body aches. No acute change of smell.  Pt only goes to grocery store.     Observations/Objective: General- pleasant, normal speech, oriented but sounds nasal congested. heent- she reports sinus pain on self palpation.  Assessment and Plan: Signs and symptoms described which may be sinus infection but can't rule out covid entirely. She does have some intermittent contact with elderly neighbors so do think best to start doxycycline for sinus infection, benzonatate cough tabs and flonase nasal spray. Put in covid test to be done at drive thru center. Pt will stay at home quarantine pending test results and clinical response to treatment. Expressed understanding on quarantine.  Follow up date to be determined after covid test result review.  Mackie Pai, PA-C  Follow Up Instructions:    I discussed the assessment and treatment plan with the patient. The patient was provided an opportunity to ask questions and all were answered.  The patient agreed with the plan and demonstrated an understanding of the instructions.   The patient was advised to call back or seek an in-person evaluation if the symptoms worsen or if the condition fails to improve as anticipated.  I provided 25 minutes of non-face-to-face time during this encounter.   Mackie Pai, PA-C   Review of Systems  Constitutional: Negative for chills, fatigue and fever.  HENT: Positive for congestion, sinus pressure and sinus pain. Negative for facial swelling, nosebleeds and sore throat.   Respiratory: Positive for cough. Negative for choking, shortness of breath and wheezing.   Cardiovascular: Negative for chest pain and palpitations.  Gastrointestinal: Negative for abdominal pain.  Musculoskeletal: Negative for back pain.  Neurological: Negative for dizziness and light-headedness.  Hematological: Negative for adenopathy. Does not bruise/bleed easily.  Psychiatric/Behavioral: Negative for behavioral problems.       Objective:   Physical Exam        Assessment & Plan:

## 2019-07-09 NOTE — Patient Instructions (Addendum)
Signs and symptoms described which may be sinus infection but can't rule out covid entirely. She does have some intermittent contact with elderly neighbors so do think best to start doxycycline for sinus infection, benzonatate cough tabs and flonase nasal spray. Put in covid test to be done at drive thru center. Pt will stay at home quarantine pending test results and clinical response to treatment. Expressed understanding on quarantine.  Follow up date to be determined after covid test result review.

## 2019-07-10 ENCOUNTER — Other Ambulatory Visit: Payer: Self-pay

## 2019-07-10 DIAGNOSIS — Z20822 Contact with and (suspected) exposure to covid-19: Secondary | ICD-10-CM

## 2019-07-12 LAB — NOVEL CORONAVIRUS, NAA: SARS-CoV-2, NAA: NOT DETECTED

## 2019-07-20 DIAGNOSIS — M9901 Segmental and somatic dysfunction of cervical region: Secondary | ICD-10-CM | POA: Diagnosis not present

## 2019-07-20 DIAGNOSIS — M791 Myalgia, unspecified site: Secondary | ICD-10-CM | POA: Diagnosis not present

## 2019-07-20 DIAGNOSIS — M9903 Segmental and somatic dysfunction of lumbar region: Secondary | ICD-10-CM | POA: Diagnosis not present

## 2019-07-20 DIAGNOSIS — M9902 Segmental and somatic dysfunction of thoracic region: Secondary | ICD-10-CM | POA: Diagnosis not present

## 2019-07-23 DIAGNOSIS — K209 Esophagitis, unspecified without bleeding: Secondary | ICD-10-CM | POA: Diagnosis not present

## 2019-07-23 DIAGNOSIS — K2210 Ulcer of esophagus without bleeding: Secondary | ICD-10-CM | POA: Diagnosis not present

## 2019-07-23 DIAGNOSIS — K228 Other specified diseases of esophagus: Secondary | ICD-10-CM | POA: Diagnosis not present

## 2019-07-23 DIAGNOSIS — K222 Esophageal obstruction: Secondary | ICD-10-CM | POA: Diagnosis not present

## 2019-07-23 DIAGNOSIS — K449 Diaphragmatic hernia without obstruction or gangrene: Secondary | ICD-10-CM | POA: Diagnosis not present

## 2019-07-28 ENCOUNTER — Encounter: Payer: Self-pay | Admitting: Physical Medicine and Rehabilitation

## 2019-07-28 ENCOUNTER — Other Ambulatory Visit: Payer: Self-pay

## 2019-07-28 ENCOUNTER — Encounter
Payer: Federal, State, Local not specified - PPO | Attending: Physical Medicine and Rehabilitation | Admitting: Physical Medicine and Rehabilitation

## 2019-07-28 VITALS — BP 109/66 | HR 92 | Temp 97.9°F | Ht 65.0 in | Wt 157.0 lb

## 2019-07-28 DIAGNOSIS — F330 Major depressive disorder, recurrent, mild: Secondary | ICD-10-CM

## 2019-07-28 DIAGNOSIS — G894 Chronic pain syndrome: Secondary | ICD-10-CM | POA: Insufficient documentation

## 2019-07-28 DIAGNOSIS — G8929 Other chronic pain: Secondary | ICD-10-CM | POA: Insufficient documentation

## 2019-07-28 DIAGNOSIS — M545 Low back pain: Secondary | ICD-10-CM | POA: Diagnosis not present

## 2019-07-28 DIAGNOSIS — R5382 Chronic fatigue, unspecified: Secondary | ICD-10-CM | POA: Diagnosis not present

## 2019-07-28 DIAGNOSIS — R2689 Other abnormalities of gait and mobility: Secondary | ICD-10-CM | POA: Insufficient documentation

## 2019-07-28 DIAGNOSIS — R4584 Anhedonia: Secondary | ICD-10-CM

## 2019-07-28 MED ORDER — FLUOXETINE HCL 20 MG PO CAPS: 20.0000 mg | ORAL_CAPSULE | Freq: Every day | ORAL | 3 refills | Status: DC

## 2019-07-28 MED ORDER — LUMBAR BACK BRACE/SUPPORT PAD MISC: 1.0000 "application " | Freq: Once | 0 refills | Status: AC

## 2019-07-28 NOTE — Progress Notes (Signed)
Subjective:    Patient ID: Catherine Hubbard, female    DOB: [birth year], 58 y.o.   MRN: [id number]  HPI  Catherine Hubbard presents for a follow-up visit. Her lower back pain has been aggravating her. Her MRI of her lumbar spine from 4/19 showed a L1 compression fracture and sacral compression fracture. Her pain was worst with extension and is present in her bilateral lower back. She uses no bracing. She has been doing her HEP every day. She has had steroid injections in the past that did provide her with relief. She would be interested in getting another injection.   She is also experiencing blurry vision and gas. She says she eats a lot of ham and salt.    She has been taking the Ritalin on mornings when she needs to be focused, not every morning. She feels that her attention is much better on those days. She feels a little less friendly when she takes the medication.   She also sees Dr. Leilani Merl and was prescribed Cymbalta and Prozac which she found to be helpful for her depression. She is still on the Cymbalta. She is still having anhedonia and is no longer getting the Prozac.    Pain Inventory Average Pain 6 Pain Right Now 3 My pain is sharp, stabbing, tingling and aching  In the last 24 hours, has pain interfered with the following? General activity 6 Relation with others 6 Enjoyment of life 6 What TIME of day is your pain at its worst? daytime, evening Sleep (in general) Fair  Pain is worse with: unsure Pain improves with: pacing activities and TENS Relief from Meds: 0  Mobility walk without assistance use a walker ability to climb steps?  no do you drive?  yes transfers alone  Function not employed: date last employed .  Neuro/Psych bladder control problems bowel control problems weakness numbness tremor tingling trouble walking spasms dizziness confusion depression anxiety  Prior Studies Any changes since last visit?  no  Physicians involved in your  care Any changes since last visit?  no   Family History  Problem Relation Age of Onset  . Cancer Mother   . Stroke Mother   . Cancer Father    Social History   Socioeconomic History  . Marital status: Married    Spouse name: Not on file  . Number of children: Not on file  . Years of education: Not on file  . Highest education level: Not on file  Occupational History  . Not on file  Social Needs  . Financial resource strain: Not on file  . Food insecurity    Worry: Not on file    Inability: Not on file  . Transportation needs    Medical: Not on file    Non-medical: Not on file  Tobacco Use  . Smoking status: Never Smoker  . Smokeless tobacco: Never Used  . Tobacco comment: second hand smoker when young. Heavy exposure.  Substance and Sexual Activity  . Alcohol use: Yes    Comment: Sts. she drinks beer, liquor daily, varying amts/fim  . Drug use: No  . Sexual activity: Not Currently  Lifestyle  . Physical activity    Days per week: Not on file    Minutes per session: Not on file  . Stress: Not on file  Relationships  . Social Musician on phone: Not on file    Gets together: Not on file    Attends religious service: Not on  file    Active member of club or organization: Not on file    Attends meetings of clubs or organizations: Not on file    Relationship status: Not on file  Other Topics Concern  . Not on file  Social History Narrative  . Not on file   Past Surgical History:  Procedure Laterality Date  . ANKLE FRACTURE SURGERY Bilateral   . CARPAL TUNNEL RELEASE Bilateral   . CHOLECYSTECTOMY    . COLONOSCOPY  11/2016   multiple  . ESOPHAGOGASTRODUODENOSCOPY  11/2016  . ESOPHAGOGASTRODUODENOSCOPY N/A 02/01/2017   Procedure: ESOPHAGOGASTRODUODENOSCOPY (EGD);  Surgeon: Iva Boop, MD;  Location: [facility] ENDOSCOPY;  Service: Endoscopy;  Laterality: N/A;  . ESOPHAGOGASTRODUODENOSCOPY (EGD) WITH PROPOFOL N/A 02/04/2017   Procedure:  ESOPHAGOGASTRODUODENOSCOPY (EGD) WITH PROPOFOL;  Surgeon: Meryl Dare, MD;  Location: [facility] ENDOSCOPY;  Service: Endoscopy;  Laterality: N/A;  . LAPAROSCOPIC GASTRIC SLEEVE RESECTION  2011  . ULNAR NERVE REPAIR Bilateral    Past Medical History:  Diagnosis Date  . Alcohol abuse   . Asthma   . Esophagitis   . GERD (gastroesophageal reflux disease)   . Headache   . Hearing loss   . Hypertension   . Multiple sclerosis (HCC)   . Pancreatitis   . Vision abnormalities    BP 109/66   Pulse 92   Temp 97.9 F (36.6 C)   Ht 5\' 5"  (1.651 m)   Wt 157 lb (71.2 kg)   LMP 08/13/2014 Comment: very few periods  SpO2 95%   BMI 26.13 kg/m   Opioid Risk Score:   Fall Risk Score:  `1  Depression screen PHQ 2/9  Depression screen PHQ 2/9 06/29/2019  Decreased Interest 3  Down, Depressed, Hopeless 3  PHQ - 2 Score 6  Altered sleeping 3  Tired, decreased energy 3  Change in appetite 3  Feeling bad or failure about yourself  3  Trouble concentrating 3  Moving slowly or fidgety/restless 3  Suicidal thoughts 3  PHQ-9 Score 27  Difficult doing work/chores Extremely dIfficult    Review of Systems  Constitutional: Positive for appetite change.  HENT: Negative.   Gastrointestinal: Positive for abdominal pain, constipation, diarrhea and nausea.  Genitourinary: Positive for difficulty urinating.  Musculoskeletal: Positive for arthralgias, back pain, gait problem, myalgias and neck pain.       Spasms   Skin: Negative.   Neurological: Positive for dizziness, tremors, weakness and numbness.       Tingling  Psychiatric/Behavioral: Positive for confusion and dysphoric mood. The patient is nervous/anxious.   All other systems reviewed and are negative.      Objective:   Physical Exam  Gen: no distress, normal appearing HEENT: oral mucosa pink and moist, NCAT Cardio: Reg rate Chest: normal effort, normal rate of breathing Abd: soft, non-distended Ext: no edema Skin: intact  Neuro/Musculoskeletal: Able to walk in office without falls, not using the walker this time. Able to stand for 5 minutes during conversation with me. Able to bend done and touch her toes with FROM. Diffuse tenderness along spine with gentle palpation. Extension and oblique extension reproduce pain in lumbar spine. 5/5 strength in arms with the exception of 4+5 in right shoulder abduction and elbow flexion. 4-/5 in bilateral legs due to diffuse weakness. No focal motor or sensory deficits.  Range of motion of neck limited to 40 degrees bilateral, tingling in neck reproduced with neck flexion.  Psych: pleasant, normal affect, attention is significantly improved from last visit, more verbosity as  well.        Assessment & Plan:  Catherine E Butler-Tayloris a 58 y.o.femalewith history of alcohol abuse, multiple sclerosis and frequent falls, presenting as a new patient to our practice today.  Catherine Hubbard has diffuse chronic pain and reduced quality of life due to a lack of purpose and motivation in her life. She is saddened by her relationship with her husband but still motivated to make relationships with her neighbors and to be engaged in life. She is also experiencing  cognitive deficits, likely due to a combination of MS, chronic pain, and depressed mood.  12/8: The Ritalin has really helped with her attention and energy and has helped her to be more active. She is taking it in a responsible way, only on mornings when she needs additional focus. She is still experiencing anhedonia.   We discussed the following plan: Continue Ritalin 5mg  as needed in the mornings to help maximize energy/motivation. Commended her reduction in alcohol use. Will prescribe Prozac for depression with anhedonia. She has been on this medication with the Cymbalta before and had a very good response. I advised her regarding the potential interaction between the medications given their same mechanism of action and  discussed warning signs she should look our for.  Keep a daily log of goals and check them off every day Maintain social connections in your life. Continue exercising with your neighbors.  Walk at least 15 minutes every day, preferably outside.  Look for local organizations and programs that can add meaning and friendship to your life.  Make an effort to do activities that you enjoy, such as going to the [employer].  Extension-based back pain: I will order a new MRI of her lumbar spine given persistent pain and prior MRI was over a year ago.This will help guide possible repeat epidural injection, which gave her relief in the past.  -Will also provide lumbar support brace for support -Quad cane to improve walking and safety, especially with stairs.  Forty minutes of face to face patient care time were spent during this visit. All questions were encouraged and answered. Follow up with me in 4 weeks to discuss MRI results, assess progress with Prozac, quad cane, and lumbar support brace.

## 2019-08-03 ENCOUNTER — Other Ambulatory Visit: Payer: Self-pay | Admitting: Neurology

## 2019-08-10 ENCOUNTER — Other Ambulatory Visit: Payer: Self-pay

## 2019-08-10 ENCOUNTER — Ambulatory Visit (HOSPITAL_COMMUNITY)
Admission: RE | Admit: 2019-08-10 | Discharge: 2019-08-10 | Disposition: A | Discharge status: Home / Self Care | Payer: Federal, State, Local not specified - PPO | Source: Ambulatory Visit | Attending: Physical Medicine and Rehabilitation | Admitting: Physical Medicine and Rehabilitation

## 2019-08-10 DIAGNOSIS — R2689 Other abnormalities of gait and mobility: Secondary | ICD-10-CM

## 2019-08-10 DIAGNOSIS — M545 Low back pain: Secondary | ICD-10-CM | POA: Diagnosis not present

## 2019-08-25 ENCOUNTER — Encounter
Payer: Federal, State, Local not specified - PPO | Attending: Physical Medicine and Rehabilitation | Admitting: Physical Medicine and Rehabilitation

## 2019-08-25 ENCOUNTER — Other Ambulatory Visit: Payer: Self-pay

## 2019-08-25 ENCOUNTER — Encounter: Payer: Self-pay | Admitting: Physical Medicine and Rehabilitation

## 2019-08-25 VITALS — BP 134/92 | HR 77 | Temp 97.8°F | Ht 65.0 in | Wt 157.0 lb

## 2019-08-25 DIAGNOSIS — M545 Low back pain: Secondary | ICD-10-CM | POA: Diagnosis not present

## 2019-08-25 DIAGNOSIS — R5382 Chronic fatigue, unspecified: Secondary | ICD-10-CM

## 2019-08-25 DIAGNOSIS — R2689 Other abnormalities of gait and mobility: Secondary | ICD-10-CM | POA: Diagnosis not present

## 2019-08-25 DIAGNOSIS — F330 Major depressive disorder, recurrent, mild: Secondary | ICD-10-CM | POA: Diagnosis not present

## 2019-08-25 DIAGNOSIS — R4584 Anhedonia: Secondary | ICD-10-CM

## 2019-08-25 DIAGNOSIS — M48062 Spinal stenosis, lumbar region with neurogenic claudication: Secondary | ICD-10-CM

## 2019-08-25 DIAGNOSIS — G9332 Myalgic encephalomyelitis/chronic fatigue syndrome: Secondary | ICD-10-CM

## 2019-08-25 DIAGNOSIS — G894 Chronic pain syndrome: Secondary | ICD-10-CM | POA: Diagnosis not present

## 2019-08-25 DIAGNOSIS — G8929 Other chronic pain: Secondary | ICD-10-CM | POA: Insufficient documentation

## 2019-08-25 MED ORDER — VENLAFAXINE HCL ER 37.5 MG PO CP24: 37.5000 mg | ORAL_CAPSULE | Freq: Every day | ORAL | 1 refills | Status: DC

## 2019-08-25 NOTE — Progress Notes (Signed)
Subjective:    Patient ID: Catherine Hubbard, female    DOB: 11-08-1960, 59 y.o.   MRN: [id number]  HPI  [name] presents for a follow-up visit.   Lower back pain: Her lower back pain has been aggravating her. Her pain is worst with extension and is present in her bilateral lower back. Since last visit she has been using a lumbar support brace which does provide some relief. She has been doing her HEP every day. She has had steroid injections in the past that did provide her with relief. She would be interested in getting another injection. She has pain in bilateral lower extremities as well in the anterior and lateral distributions. This pain is worse with movement-walking, standing, and when she lays flat.    Other symptoms: She is also experiencing blurry vision and gas. She says she eats a lot of ham and salt.     Poor energy: She has been taking the Ritalin on mornings when she needs to be focused, not every morning. She feels that her attention is much better on those days. She feels a little less friendly when she takes the medication.    Depression: She say a psychiatrist in the past and was prescribed Prozac, Effexor, and Cymbalta which she found to be helpful for her depression. Last visit we restarted her on Prozac and this did provide some relief. She has not been following with the psychiatrist any more and does not want to at this time.    Pain Inventory Average Pain 7 Pain Right Now 6 My pain is constant, sharp, burning, dull, stabbing, tingling and aching  In the last 24 hours, has pain interfered with the following? General activity 9 Relation with others 9 Enjoyment of life 10 What TIME of day is your pain at its worst? . Sleep (in general) Poor  Pain is worse with: walking, bending, sitting, inactivity, standing and some activites Pain improves with: na Relief from Meds: na  Mobility walk without assistance use a walker do you drive?   yes  Function not employed: date last employed 2020 I need assistance with the following:  meal prep, household duties and shopping  Neuro/Psych bladder control problems bowel control problems weakness numbness tremor tingling trouble walking spasms dizziness depression anxiety loss of taste or smell  Prior Studies Any changes since last visit?  no  Physicians involved in your care Any changes since last visit?  no   Family History  Problem Relation Age of Onset  . Cancer Mother   . Stroke Mother   . Cancer Father    Social History   Socioeconomic History  . Marital status: Married    Spouse name: Not on file  . Number of children: Not on file  . Years of education: Not on file  . Highest education level: Not on file  Occupational History  . Not on file  Tobacco Use  . Smoking status: Never Smoker  . Smokeless tobacco: Never Used  . Tobacco comment: second hand smoker when young. Heavy exposure.  Substance and Sexual Activity  . Alcohol use: Yes    Comment: Sts. she drinks beer, liquor daily, varying amts/fim  . Drug use: No  . Sexual activity: Not Currently  Other Topics Concern  . Not on file  Social History Narrative  . Not on file   Social Determinants of Health   Financial Resource Strain:   . Difficulty of Paying Living Expenses: Not on file  Food  Insecurity:   . Worried About Programme researcher, broadcasting/film/video in the Last Year: Not on file  . Ran Out of Food in the Last Year: Not on file  Transportation Needs:   . Lack of Transportation (Medical): Not on file  . Lack of Transportation (Non-Medical): Not on file  Physical Activity:   . Days of Exercise per Week: Not on file  . Minutes of Exercise per Session: Not on file  Stress:   . Feeling of Stress : Not on file  Social Connections:   . Frequency of Communication with Friends and Family: Not on file  . Frequency of Social Gatherings with Friends and Family: Not on file  . Attends Religious  Services: Not on file  . Active Member of Clubs or Organizations: Not on file  . Attends Banker Meetings: Not on file  . Marital Status: Not on file   Past Surgical History:  Procedure Laterality Date  . ANKLE FRACTURE SURGERY Bilateral   . CARPAL TUNNEL RELEASE Bilateral   . CHOLECYSTECTOMY    . COLONOSCOPY  11/2016   multiple  . ESOPHAGOGASTRODUODENOSCOPY  11/2016  . ESOPHAGOGASTRODUODENOSCOPY N/A 02/01/2017   Procedure: ESOPHAGOGASTRODUODENOSCOPY (EGD);  Surgeon: Iva Boop, MD;  Location: [facility] ENDOSCOPY;  Service: Endoscopy;  Laterality: N/A;  . ESOPHAGOGASTRODUODENOSCOPY (EGD) WITH PROPOFOL N/A 02/04/2017   Procedure: ESOPHAGOGASTRODUODENOSCOPY (EGD) WITH PROPOFOL;  Surgeon: Meryl Dare, MD;  Location: [facility] ENDOSCOPY;  Service: Endoscopy;  Laterality: N/A;  . LAPAROSCOPIC GASTRIC SLEEVE RESECTION  2011  . ULNAR NERVE REPAIR Bilateral    Past Medical History:  Diagnosis Date  . Alcohol abuse   . Asthma   . Esophagitis   . GERD (gastroesophageal reflux disease)   . Headache   . Hearing loss   . Hypertension   . Multiple sclerosis (HCC)   . Pancreatitis   . Vision abnormalities    BP (!) 134/92   Pulse 77   Temp 97.8 F (36.6 C)   Ht 5\' 5"  (1.651 m)   Wt 157 lb (71.2 kg)   LMP 08/13/2014 Comment: very few periods  SpO2 97%   BMI 26.13 kg/m   Opioid Risk Score:   Fall Risk Score:  `1  Depression screen PHQ 2/9  Depression screen PHQ 2/9 06/29/2019  Decreased Interest 3  Down, Depressed, Hopeless 3  PHQ - 2 Score 6  Altered sleeping 3  Tired, decreased energy 3  Change in appetite 3  Feeling bad or failure about yourself  3  Trouble concentrating 3  Moving slowly or fidgety/restless 3  Suicidal thoughts 3  PHQ-9 Score 27  Difficult doing work/chores Extremely dIfficult    Review of Systems  Constitutional: Positive for appetite change and unexpected weight change.  HENT: Negative.   Eyes: Negative.   Respiratory: Positive for  wheezing.   Cardiovascular: Negative.   Gastrointestinal: Positive for constipation and diarrhea.  Endocrine: Negative.   Genitourinary: Positive for difficulty urinating and dysuria.  Musculoskeletal: Positive for arthralgias, back pain, gait problem and myalgias.  Skin: Negative.   Allergic/Immunologic: Negative.   Neurological: Positive for dizziness, tremors, weakness and numbness.  Hematological: Negative.   Psychiatric/Behavioral: Positive for dysphoric mood. The patient is nervous/anxious.   All other systems reviewed and are negative.      Objective:   Physical Exam Gen: no distress, normal appearing HEENT: oral mucosa pink and moist, NCAT Cardio: Reg rate Chest: normal effort, normal rate of breathing Abd: soft, non-distended Ext: no edema Skin: intact Neuro/Musculoskeletal:Able  to walk in office without falls, not using the walker this time. Able to stand for 5 minutes during conversation with me. Able to bend down and touch her toes with FROM. Diffuse tenderness along spine with gentle palpation. Extension and oblique extension reproduce pain in lumbar spine. 5/5 strength in arms with the exception of 4+5 in right shoulder abduction and elbow flexion. 4-/5 in bilateral legs due to diffuse weakness. No focal motor or sensory deficits. + Slump test bilaterally (worse on left).  Range of motion of neck limited to 40 degrees bilateral, tingling in neck reproduced with neck flexion. Psych: pleasant, normal affect, attention is significantly improved from last visit, more verbosity as well.        Assessment & Plan:  Neilah Fulwider Butler-Tayloris a 60 y.o.femalewith history of alcohol abuse, multiple sclerosis and frequent falls, presenting for follow-up for her diffuse chronic pain and depression.  Mrs. Butler-Taylor has diffuse chronic pain and reduced quality of life due to a lack of purpose and motivation in her life. She is saddened by her relationship with her husband  but still motivated to make relationships with her neighbors and to be engaged in life. She is also experiencing cognitive deficits, likely due to a combination of MS, chronic pain, and depressed mood.  1) Poor energy: The Ritalin has really helped with her attention and energy and has helped her to be more active. She is taking it in a responsible way, only on mornings when she needs additional focus. She is still experiencing anhedonia.   We discussed the following plan: Continue Ritalin5mg as needed in the mornings to help maximize energy/motivation.  Commended her reduction in alcohol use.  2) Depression: Continue Prozac for depression with anhedonia. She has been on this medication with the Cymbalta before and had a very good response. I advised her regarding the potential interaction between the medications given their same mechanism of action and discussed warning signs she should look our for. Will add Effexor since she had good response with combined treatment with these medications in the past. Started on 37.5mg  daily, can consider increase to 75mg  at next visit (her prior dose) if well tolerated.   Keep a daily log of goals and check them off every day  Maintain social connections in your life. Continue exercising with your neighbors.   Walk at least 15 minutes every day, preferably outside.   Look for local organizations and programs that can add meaning and friendship to your life.  Make an effort to do activities that you enjoy, such as going to the .   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  -Continue lumbar brace for support -Quad cane to improve walking and safety, especially with stairs. -Referral to Dr. [employer] for L4-L5 epidural steroid injection. Although recent MRI shows mild-moderate stenosis, she has symptoms of pain that is worst with extension-based activities and has poor extension on exam with positive Slump test  (worse on left). She has benefited from epidural steroid injections in the past.    All questions were encouraged and answered. Follow up with me in4 weeks.

## 2019-09-01 ENCOUNTER — Other Ambulatory Visit: Payer: Self-pay | Admitting: Neurology

## 2019-09-02 ENCOUNTER — Other Ambulatory Visit: Payer: Self-pay | Admitting: Neurology

## 2019-09-07 ENCOUNTER — Other Ambulatory Visit: Payer: Self-pay | Admitting: Medical

## 2019-09-10 ENCOUNTER — Encounter: Payer: Self-pay | Admitting: Physical Medicine & Rehabilitation

## 2019-09-10 ENCOUNTER — Encounter: Payer: Federal, State, Local not specified - PPO | Admitting: Physical Medicine & Rehabilitation

## 2019-09-10 ENCOUNTER — Other Ambulatory Visit: Payer: Self-pay

## 2019-09-10 VITALS — BP 165/107 | HR 79 | Temp 97.9°F | Ht 65.0 in | Wt 161.0 lb

## 2019-09-10 DIAGNOSIS — G8929 Other chronic pain: Secondary | ICD-10-CM | POA: Diagnosis not present

## 2019-09-10 DIAGNOSIS — R5382 Chronic fatigue, unspecified: Secondary | ICD-10-CM | POA: Diagnosis not present

## 2019-09-10 DIAGNOSIS — M5416 Radiculopathy, lumbar region: Secondary | ICD-10-CM | POA: Diagnosis not present

## 2019-09-10 DIAGNOSIS — R2689 Other abnormalities of gait and mobility: Secondary | ICD-10-CM | POA: Diagnosis not present

## 2019-09-10 DIAGNOSIS — M545 Low back pain: Secondary | ICD-10-CM | POA: Diagnosis not present

## 2019-09-10 DIAGNOSIS — G894 Chronic pain syndrome: Secondary | ICD-10-CM | POA: Diagnosis not present

## 2019-09-10 NOTE — Progress Notes (Signed)
  PROCEDURE RECORD [facility]   Name: Catherine Hubbard DOB:12/28/1960 MRN: [id number]  Date:09/10/2019  Physician: Claudette Laws, MD    Nurse/CMA: Jyles Sontag, CMA  Allergies:  Allergies  Allergen Reactions  . 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 Nsaids Other (See Comments)    Gastric bypass    Consent Signed: Yes.    Is patient diabetic? No.  CBG today?   Pregnant: No. LMP: Patient's last menstrual period was 08/13/2014. (age 107-55)  Anticoagulants: no Anti-inflammatory: no Antibiotics: no  Procedure: right L4-5 transforaminal epidural steroid injection  Position: Prone Start Time: 1:20pm  End Time: 1:20pm  Fluoro Time: 23s  RN/CMA Catharina Pica, CMA Jannice Beitzel, CMA    Time 1:05pm 1:28pm    BP 165/107 167/105    Pulse 79 78    Respirations 14 14    O2 Sat 98 98    S/S 6 6    Pain Level 8/10 7/10     D/C home with husband , patient A & O X 3, D/C instructions reviewed, and sits independently.

## 2019-09-10 NOTE — Progress Notes (Signed)
Right L4-5 Lumbar transforaminal epidural steroid injection under fluoroscopic guidance with contrast enhancement  Indication: R>L Lumbosacral radiculitis is not relieved by medication management or other conservative care and interfering with self-care and mobility.   Informed consent was obtained after describing risk and benefits of the procedure with the patient, this includes bleeding, bruising, infection, paralysis and medication side effects.  The patient wishes to proceed and has given written consent.  Patient was placed in prone position.  The lumbar area was marked and prepped with Betadine.  It was entered with a 25-gauge 1-1/2 inch needle and one mL of 1% lidocaine was injected into the skin and subcutaneous tissue.  Then a 22-gauge 3.5 spinal needle was inserted into the Right L4-5  intervertebral foramen under AP, lateral, and oblique view.  Once needle tip was within the foramen on lateral views an dnor exceeding 6 o clock position on th epedical on AP viewed Isovue 200 was inected x 47ml Then a solution containing one mL of 10 mg per mL dexamethasone and 2 mL of 1% lidocaine was injected.  The patient tolerated procedure well.  Post procedure instructions were given.  Please see post procedure form.

## 2019-09-10 NOTE — Patient Instructions (Signed)

## 2019-09-18 ENCOUNTER — Other Ambulatory Visit: Payer: Self-pay | Admitting: Neurology

## 2019-09-22 ENCOUNTER — Encounter: Payer: Federal, State, Local not specified - PPO | Admitting: Physical Medicine and Rehabilitation

## 2019-09-30 DIAGNOSIS — G35 Multiple sclerosis: Secondary | ICD-10-CM | POA: Diagnosis not present

## 2019-10-05 ENCOUNTER — Other Ambulatory Visit: Payer: Self-pay | Admitting: *Deleted

## 2019-10-05 DIAGNOSIS — M9903 Segmental and somatic dysfunction of lumbar region: Secondary | ICD-10-CM | POA: Diagnosis not present

## 2019-10-05 DIAGNOSIS — M791 Myalgia, unspecified site: Secondary | ICD-10-CM | POA: Diagnosis not present

## 2019-10-05 DIAGNOSIS — M9901 Segmental and somatic dysfunction of cervical region: Secondary | ICD-10-CM | POA: Diagnosis not present

## 2019-10-05 DIAGNOSIS — M9902 Segmental and somatic dysfunction of thoracic region: Secondary | ICD-10-CM | POA: Diagnosis not present

## 2019-10-05 MED ORDER — SODIUM CHLORIDE 0.9 % IV SOLN: 600.0000 mg | INTRAVENOUS | 1 refills | Status: DC

## 2019-10-05 MED ORDER — OCREVUS 300 MG/10ML IV SOLN: 600.0000 mg | INTRAVENOUS | 1 refills | Status: DC

## 2019-10-05 NOTE — Telephone Encounter (Signed)
Order placed

## 2019-10-05 NOTE — Addendum Note (Signed)
Addended by: Guy Begin on: 10/05/2019 10:39 AM   Modules accepted: Orders

## 2019-10-06 ENCOUNTER — Encounter
Payer: Federal, State, Local not specified - PPO | Attending: Physical Medicine and Rehabilitation | Admitting: Physical Medicine and Rehabilitation

## 2019-10-06 ENCOUNTER — Encounter: Payer: Self-pay | Admitting: Physical Medicine and Rehabilitation

## 2019-10-06 ENCOUNTER — Other Ambulatory Visit: Payer: Self-pay

## 2019-10-06 VITALS — BP 168/103 | HR 74 | Temp 97.7°F | Ht 65.0 in | Wt 160.0 lb

## 2019-10-06 DIAGNOSIS — R5382 Chronic fatigue, unspecified: Secondary | ICD-10-CM

## 2019-10-06 DIAGNOSIS — G894 Chronic pain syndrome: Secondary | ICD-10-CM | POA: Insufficient documentation

## 2019-10-06 DIAGNOSIS — G8929 Other chronic pain: Secondary | ICD-10-CM | POA: Insufficient documentation

## 2019-10-06 DIAGNOSIS — F330 Major depressive disorder, recurrent, mild: Secondary | ICD-10-CM | POA: Diagnosis not present

## 2019-10-06 DIAGNOSIS — M545 Low back pain: Secondary | ICD-10-CM | POA: Diagnosis not present

## 2019-10-06 DIAGNOSIS — G35 Multiple sclerosis: Secondary | ICD-10-CM

## 2019-10-06 DIAGNOSIS — G9332 Myalgic encephalomyelitis/chronic fatigue syndrome: Secondary | ICD-10-CM

## 2019-10-06 DIAGNOSIS — R2689 Other abnormalities of gait and mobility: Secondary | ICD-10-CM | POA: Insufficient documentation

## 2019-10-06 DIAGNOSIS — M5416 Radiculopathy, lumbar region: Secondary | ICD-10-CM

## 2019-10-06 MED ORDER — VENLAFAXINE HCL ER 37.5 MG PO CP24: 37.5000 mg | ORAL_CAPSULE | Freq: Every day | ORAL | 1 refills | Status: DC

## 2019-10-06 MED ORDER — DICLOFENAC SODIUM 1 % EX GEL: 4.0000 g | Freq: Four times a day (QID) | CUTANEOUS | 1 refills | Status: DC

## 2019-10-06 MED ORDER — FLUOXETINE HCL 20 MG PO CAPS: 20.0000 mg | ORAL_CAPSULE | Freq: Every day | ORAL | 3 refills | Status: DC

## 2019-10-06 NOTE — Progress Notes (Signed)
Subjective:    Patient ID: Catherine Hubbard, female    DOB: 1960/12/12, 59 y.o.   MRN: [id number]  HPI Catherine Hubbard presents for a follow-up visit.    Lower back pain:  08/25/19: Her lower back pain is worst with extension and is present in her bilateral lower back. Since last visit she has been using a lumbar support brace which does provide some relief. She has been doing her HEP every day. She has had steroid injections in the past that did provide her with relief. She has pain in bilateral lower extremities as well in the anterior and lateral distributions. This pain is worse with movement-walking, standing, and when she lays flat.  10/06/19: On 1/21 she had a right sided L4-L5 epidural steroid injection with Dr. Letta Pate. Her back pain is now much improved.    Bilateral hip pain: She has had some bilateral hip pain. She has been doing exercises at home for this. But she is now able to walk a lot more. She sometimes goes to the grocery store for 2 hours at a time to do her shopping as well as get exercise in walking. She still has difficulty with stairs.    Poor energy:  2/16: Her energy is now much improved and she has stopped taking the Ritalin.    Depression:  1/05: She say a psychiatrist in the past and was prescribed Prozac, Effexor, and Cymbalta which she found to be helpful for her depression. Last visit we restarted her on Prozac and this did provide some relief. She has not been following with the psychiatrist any more and does not want to at this time.   2/16: Her mood is much improved with these medications and she is not experiencing any adverse side effects. Her friends tell her she is like a new person and she has excellent energy. 1 month ago she says she felt she could not return to work but now she feels that she may be able to.   Left CMC joint pain: She notes increased swelling in this joint compared to the opposite side and it can sometimes pain her a lot. She has a  listed allergy to NSAIDs but said this was gastritis from ibuprofen more than 10 years ago. She has since taken ibuprofen without issues. Has never had any imaging of her hand or splint or steroid injection.   Pain Inventory Average Pain 4 Pain Right Now 4 My pain is sharp, stabbing, tingling and aching  In the last 24 hours, has pain interfered with the following? General activity 1 Relation with others 1 Enjoyment of life 1 What TIME of day is your pain at its worst? varies with activity Sleep (in general) Poor  Pain is worse with: bending, inactivity, standing and some activites Pain improves with: heat/ice, pacing activities and TENS Relief from Meds: n/a  Mobility walk without assistance walk with assistance use a cane use a walker how many minutes can you walk? 15-20 ability to climb steps?  no do you drive?  yes  Function not employed: date last employed . I need assistance with the following:  household duties  Neuro/Psych bladder control problems bowel control problems weakness numbness tremor tingling trouble walking spasms depression anxiety  Prior Studies Any changes since last visit?  no  Physicians involved in your care Any changes since last visit?  no   Family History  Problem Relation Age of Onset  . Cancer Mother   . Stroke Mother   .  Cancer Father    Social History   Socioeconomic History  . Marital status: Married    Spouse name: Not on file  . Number of children: Not on file  . Years of education: Not on file  . Highest education level: Not on file  Occupational History  . Not on file  Tobacco Use  . Smoking status: Never Smoker  . Smokeless tobacco: Never Used  . Tobacco comment: second hand smoker when young. Heavy exposure.  Substance and Sexual Activity  . Alcohol use: Yes    Comment: Sts. she drinks beer, liquor daily, varying amts/fim  . Drug use: No  . Sexual activity: Not Currently  Other Topics Concern  . Not on  file  Social History Narrative  . Not on file   Social Determinants of Health   Financial Resource Strain:   . Difficulty of Paying Living Expenses: Not on file  Food Insecurity:   . Worried About Programme researcher, broadcasting/film/video in the Last Year: Not on file  . Ran Out of Food in the Last Year: Not on file  Transportation Needs:   . Lack of Transportation (Medical): Not on file  . Lack of Transportation (Non-Medical): Not on file  Physical Activity:   . Days of Exercise per Week: Not on file  . Minutes of Exercise per Session: Not on file  Stress:   . Feeling of Stress : Not on file  Social Connections:   . Frequency of Communication with Friends and Family: Not on file  . Frequency of Social Gatherings with Friends and Family: Not on file  . Attends Religious Services: Not on file  . Active Member of Clubs or Organizations: Not on file  . Attends Banker Meetings: Not on file  . Marital Status: Not on file   Past Surgical History:  Procedure Laterality Date  . ANKLE FRACTURE SURGERY Bilateral   . CARPAL TUNNEL RELEASE Bilateral   . CHOLECYSTECTOMY    . COLONOSCOPY  11/2016   multiple  . ESOPHAGOGASTRODUODENOSCOPY  11/2016  . ESOPHAGOGASTRODUODENOSCOPY N/A 02/01/2017   Procedure: ESOPHAGOGASTRODUODENOSCOPY (EGD);  Surgeon: Iva Boop, MD;  Location[facility] ENDOSCOPY;  Service: Endoscopy;  Laterality: N/A;  . ESOPHAGOGASTRODUODENOSCOPY (EGD) WITH PROPOFOL N/A 02/04/2017   Procedure: ESOPHAGOGASTRODUODENOSCOPY (EGD) WITH PROPOFOL;  Surgeon: Meryl Dare, MD;  Location: [facility] ENDOSCOPY;  Service: Endoscopy;  Laterality: N/A;  . LAPAROSCOPIC GASTRIC SLEEVE RESECTION  2011  . ULNAR NERVE REPAIR Bilateral    Past Medical History:  Diagnosis Date  . Alcohol abuse   . Asthma   . Esophagitis   . GERD (gastroesophageal reflux disease)   . Headache   . Hearing loss   . Hypertension   . Multiple sclerosis (HCC)   . Pancreatitis   . Vision abnormalities    LMP 08/13/2014  Comment: very few periods  Opioid Risk Score:   Fall Risk Score:  `1  Depression screen PHQ 2/9  Depression screen PHQ 2/9 06/29/2019  Decreased Interest 3  Down, Depressed, Hopeless 3  PHQ - 2 Score 6  Altered sleeping 3  Tired, decreased energy 3  Change in appetite 3  Feeling bad or failure about yourself  3  Trouble concentrating 3  Moving slowly or fidgety/restless 3  Suicidal thoughts 3  PHQ-9 Score 27  Difficult doing work/chores Extremely dIfficult    Review of Systems  Constitutional: Positive for diaphoresis.  HENT: Negative.   Eyes: Negative.   Gastrointestinal: Positive for constipation and diarrhea.  Endocrine: Negative.   Genitourinary: Positive for difficulty urinating.  Musculoskeletal: Positive for arthralgias, back pain, gait problem and neck pain.       Spasms  Skin: Negative.   Allergic/Immunologic: Negative.   Neurological: Positive for dizziness, tremors, weakness and numbness.       Tingling   Psychiatric/Behavioral: Positive for dysphoric mood. The patient is nervous/anxious.        Objective:   Physical Exam Gen: no distress, normal appearing HEENT: oral mucosa pink and moist, NCAT Cardio: Reg rate Chest: normal effort, normal rate of breathing Abd: soft, non-distended Ext: no edema Skin: intact Neuro/Musculoskeletal: Able to walk in office without falls, not using the walker this time. Able to stand for 5 minutes during conversation with me. Able to bend down and touch her toes with FROM. Diffuse tenderness along spine with gentle palpation. Extension and oblique extension reproduce pain in lumbar spine. 5/5 strength in arms with the exception of 4+5 in right shoulder abduction and elbow flexion. 4-/5 in bilateral legs due to diffuse weakness. No focal motor or sensory deficits. + Slump test is now negative bilaterally.  Has increased swelling at left [facility] joint with increased tenderness to palpation compared to opposite side.  Range of  motion of neck limited to 40 degrees bilateral, tingling in neck reproduced with neck flexion.  Psych: pleasant, normal affect, attention is significantly improved from last visit.         Assessment & Plan:  Catherine Hubbard is a 59 y.o. female with history of alcohol abuse, multiple sclerosis and frequent falls, presenting for follow-up for her diffuse chronic pain and depression. She was last seen 1 month prior.    1) Poor energy: The Ritalin has really helped with her attention and energy and has helped her to be more active. Her energy is tremendously improved and she no longer requires the Ritalin which she has stopped taking on her own.    2) Depression: Continue Prozac and Effexor for depression. Mood is much improved!   Keep a daily log of goals and check them off every day   Maintain social connections in your life. Continue exercising with your neighbors.    Walk at least 15 minutes every day, preferably outside.    Look for local organizations and programs that can add meaning and friendship to your life.    Make an effort to do activities that you enjoy, such as going to the [employer].     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  -I have personally reviewed Lumbar MRI which shows mild-moderate stenosis. On 1/21 she had a right sided L4-L5 epidural steroid injection with Dr. Wynn Banker with great improvement in symptoms.   4) Bilateral greater trochanteric pain syndrome: Continue HEP. Educated regarding etiology and treatment of condition.  5) Impaired mobility and ADLs secondary to Multiple Sclerosis, Major Depression Disorder, L4-L5 lumbar stenosis: Patient's overall mobility and mental health are much improved. She has been trying to obtain disability for financial support and I provided her with the address and phone number of the social security office and advised her that I am happy to help fill out any paperwork she needs. That  being said, given her improved physical and mental health and her social nature, I advised her that returning to work on a part-time basis may be very good for her, improving her quality of life, physical function, giving her purpose, and improving her financial situation. She is agreeable to this and feels  that she may be able to return to work now and will look for jobs that interest her and that will not be too strenuous for her (avoiding lifting heavy weights or climbing stairs).   6) Left CMC arthritis: Prescribed diclofenac gel and advised that patient may apply up to 4 times per day. If this does not provide relief asked that patient call our office and I can order an XR and provide a referral for fitting for a splint. If this does not provide relief, we can consider corticosteroid injection.     All questions were encouraged and answered. Follow up with me in 3 months.

## 2019-10-06 NOTE — Patient Instructions (Signed)
Social Medical laboratory scientific officer in [place]: Address: [address] [place], [place], [place] [address] Phone: [phone]

## 2019-10-10 ENCOUNTER — Other Ambulatory Visit: Payer: Self-pay | Admitting: Neurology

## 2019-10-12 ENCOUNTER — Telehealth: Payer: Self-pay

## 2019-10-12 ENCOUNTER — Other Ambulatory Visit: Payer: Self-pay | Admitting: Medical

## 2019-10-12 NOTE — Telephone Encounter (Signed)
Started a PA for pts Modanfinil 200mg  on CMM.  Awaiting response.   Key: [id number]   FYI: when filling out a PA for this patient on CMM make sure to remove the - dashes. Also the last name is combined .

## 2019-10-12 NOTE — Telephone Encounter (Signed)
Received approval sent to pts pharmacy.  Received confirmation fax.

## 2019-10-22 DIAGNOSIS — K449 Diaphragmatic hernia without obstruction or gangrene: Secondary | ICD-10-CM | POA: Diagnosis not present

## 2019-10-22 DIAGNOSIS — K2210 Ulcer of esophagus without bleeding: Secondary | ICD-10-CM | POA: Diagnosis not present

## 2019-10-22 DIAGNOSIS — K228 Other specified diseases of esophagus: Secondary | ICD-10-CM | POA: Diagnosis not present

## 2019-10-22 DIAGNOSIS — K222 Esophageal obstruction: Secondary | ICD-10-CM | POA: Diagnosis not present

## 2019-10-22 DIAGNOSIS — K9171 Accidental puncture and laceration of a digestive system organ or structure during a digestive system procedure: Secondary | ICD-10-CM | POA: Diagnosis not present

## 2019-10-26 ENCOUNTER — Other Ambulatory Visit: Payer: Self-pay | Admitting: Medical

## 2019-11-26 ENCOUNTER — Telehealth: Payer: Self-pay | Admitting: Medical

## 2019-11-26 MED ORDER — ALBUTEROL SULFATE (2.5 MG/3ML) 0.083% IN NEBU: 2.5000 mg | INHALATION_SOLUTION | Freq: Four times a day (QID) | RESPIRATORY_TRACT | 1 refills | Status: DC | PRN

## 2019-11-26 NOTE — Telephone Encounter (Signed)
Medication sent.

## 2019-11-26 NOTE — Telephone Encounter (Signed)
Medication:albuterol (PROVENTIL) (2.5 MG/3ML) 0.083% nebulizer solution [[id number]]   Has the patient contacted their pharmacy? No. (If no, request that the patient contact the pharmacy for the refill.) (If yes, when and what did the pharmacy advise?)  Preferred Pharmacy (with phone number or street name):  [id number] [place], [place] Phone:  [phone]  Fax:  [phone]      Agent: Please be advised that RX refills may take up to 3 business days. We ask that you follow-up with your pharmacy.

## 2019-12-01 ENCOUNTER — Telehealth: Payer: Self-pay | Admitting: Medical

## 2019-12-01 NOTE — Telephone Encounter (Signed)
Patient is requesting THE PRO Air inhaler, patient go the wrong medication sent in last week.  [id number] [place], [place] - [address] [place] [address]  Phone:  [phone] Fax:  [phone]  DEA #:  [id number]     Please advise

## 2019-12-02 ENCOUNTER — Encounter: Payer: Self-pay | Admitting: Neurology

## 2019-12-02 ENCOUNTER — Telehealth: Payer: Self-pay | Admitting: *Deleted

## 2019-12-02 ENCOUNTER — Telehealth: Payer: Self-pay | Admitting: Medical

## 2019-12-02 ENCOUNTER — Ambulatory Visit: Payer: Federal, State, Local not specified - PPO | Admitting: Neurology

## 2019-12-02 ENCOUNTER — Other Ambulatory Visit: Payer: Self-pay

## 2019-12-02 VITALS — BP 145/97 | HR 88 | Temp 97.9°F | Ht 65.0 in | Wt 166.0 lb

## 2019-12-02 DIAGNOSIS — R3915 Urgency of urination: Secondary | ICD-10-CM

## 2019-12-02 DIAGNOSIS — G35 Multiple sclerosis: Secondary | ICD-10-CM

## 2019-12-02 DIAGNOSIS — Z79899 Other long term (current) drug therapy: Secondary | ICD-10-CM

## 2019-12-02 DIAGNOSIS — F418 Other specified anxiety disorders: Secondary | ICD-10-CM | POA: Diagnosis not present

## 2019-12-02 DIAGNOSIS — F1011 Alcohol abuse, in remission: Secondary | ICD-10-CM | POA: Insufficient documentation

## 2019-12-02 DIAGNOSIS — R260 Ataxic gait: Secondary | ICD-10-CM | POA: Diagnosis not present

## 2019-12-02 MED ORDER — OXYBUTYNIN CHLORIDE 5 MG PO TABS: 5.0000 mg | ORAL_TABLET | Freq: Two times a day (BID) | ORAL | 11 refills | Status: DC

## 2019-12-02 MED ORDER — ALBUTEROL SULFATE HFA 108 (90 BASE) MCG/ACT IN AERS: 2.0000 | INHALATION_SPRAY | Freq: Four times a day (QID) | RESPIRATORY_TRACT | 2 refills | Status: DC | PRN

## 2019-12-02 NOTE — Progress Notes (Signed)
[facility]  PATIENT: Catherine Hubbard DOB: [birth year]   _________________________________   HISTORICAL  CHIEF COMPLAINT:  Chief Complaint  Patient presents with  . Follow-up    RM 12, alone. Last seen 06/02/2019 by AL,NP.   . Multiple Sclerosis    On Ocrevus. Last: 09/2019. She was late getting her Emogene Morgan (was supposed to get 07/2019). Next one in August 2021.     HISTORY OF PRESENT ILLNESS:  Catherine Hubbard is a 59 y.o. woman with relapsing remitting MS.    Update 12/02/2019: She feels she has done well on Ocrevus and denies any new exacerbations.   She is walking reasonably well not needing a cane and has no recent falls.   Strength is mildly weaker in her left leg but hands more symmetric.      She denies numbness.   Vision is doing well.    She feels bladder function is unchanged with urinary urgency and incontinence.    Last year, she had a fall and developed a left parietal lobe hematoma.    She reports she is rarely drinking ETOH now.    Update 10/28/2018: Yesterday, she went to the kitchen after getting up.  While walking back, she was veering and felt unsteady.   She went back to bed.  A little later she got up and while walking back from her shower, she had a rotational vertigo and she fell.     She hit her head and went to the ED.   There was no LOC before or with the fall.  CT showed a left parietal hematoma and stable volume loss and white matter changes form MS.     She has shooting pains in her face and numbness in her hands and feet.    No change in bladder and occasional urge incontinence.   She has had stomach pain since an EGD and also had a CT abdomen showing chronic DJD, chronic L1 compression deformity and old pubic rami fracture.  No free fluid or acute change.   She has had gastric sleeve surgery.      In January, copper was low normal at 72.     Update 08/26/2018: Her MS seems mostly stable.   She just had her Ocrevus infusion  yesterday.   She tolerated it well and feels better since the infusion.    A few days ago, she fell reaching for her Ipad ona table and hit her forehead requiring stitches.   She still reports pain there.   No LOC.    She has not had any recent episode of syncope (last one summer 2019).  Her gait has done worse the last year, requiring her cane more.  She has occasional stumbles and rare falls.   Balance is off.   Her legs seem mildly weak, maybe right mildly worse.   She has numbness in her legs.    She notes more edema in her legs.   She is trying to cut back on salt and she sometimes wears compression stockings.  She has urinary frequency with urge incontinence.   Some fecal incontinence.  She has a recent UTI.     Update 03/27/2018: She had syncope 2 months ago.  She was looking out the window and then woke up on the floor with her boss looking over her.    She reportedly hit her head on her way down.   She is not sure she lost consciousness but others toild her she  did for just a few seconds and woke back up as soon as someone touched her head.    She also tripped 03/02/2018 and broke 2 ribs on her right when she slipped on 'goose crap'.    She was taken to [facility] and was admitted.   Of note, she had GI issues and was dehydrated and also had a UTI, hyponatemia, hypokalemia and mild hypoglycemia.     She is trying to do better with fluids.     Echo showed LVH and grade 1 diastolic dysfunction.  She feels her MS is mostly stable.   She is on Ocrevus (last one June, next one December 2019).   She stopped many of the symptomatic medications   Gait is ok.   She wears flats as low heels may have increased fall risk.  She reports fatigue but feels better than she did a couple months ago.  She is sleeping ok most nights.   She notes urinary urgency but not bad enough to take a medication for it.   Update 01/20/2018: She has been on ocrelizumab and just had her second dose of the medication.   She has  tolerated it well and has not had any exacerbations.    She feels stable for the most part but has had more falls, usually due to stumbling or tripping.   Gait does not seem that different to her.    She denies any change in strength or sensation.  She is noting urinary frequency with some burning and is concerned about a urinary tract infection.  She has had these in the past.  She gets LBP helped by muscle relaxants.  The pain is midline and radiates to the proximal legs.  She also has pain in her shoulders and arms.   She notes spasms in hr neck.       She has esophagitis and is supposed to have a stretch procedure soon.      She also has a h/o gastric bypass  Update 07/22/2017:   She had her first dose of ocrelizumab in November. She tolerated it well and there were no complications. Her next infusion will be at the end of April. For the most part, she feels the MS has been stable. However, she did note some word finding difficulties last week. This is better this week.    She felt mildly stronger since the infusion.   She feels off balanced, especially if she turns fast or someone bumps into her.   She needs to hold on in the shower.  Her right leg is weaker than her left and sometimes has a foot drop, esp if walking longer distances.     She is doing Geophysicist/field seismologist yoga.   She notes spasticity in legs at night, helped by valium.    Late last month,  she noted a few days where she had trouble coming up with the right word.   She feels she is doing better than last week ans almost back to baseline.    Her bladder is about the same.     Vision is mostly stable.   She lost her prescription glasses and uses readers to read.    She notes pain, mostly in the mid back.   Yoga has not helped the pain much.    She notes sometimes bruising more easily, especially after scratching.    Mood is doing ok, helped by duloxetine and Wellbutrin.   Drinking is better.  From 10/09/2016:  MS:   She is on Rebif  and tolerates it well.    However, her last injection was 08/17/2016 and she has not yet gotten refill.  She denies any definite exacerbations but she feels balance, footdrop and writing are worse.   Gait/strength/sensation:   She notes a right foot drop and over last year the left leg is also bothering her more.   She reports poor balance .   She does not need a cane.  She has spasticity in both legs, worse on the right and she sometimes gets spasms that are worse at night.      Sometimes she feels like she is being pushed to the right as she walks.   She has tingling in her hands and face.  Baclofen has helped spasticity some.   Cyclobenzaprine has not helped spasms.     Bladder:   She reports urinary frequency and occasional incontinence.   She feels bladder function is worse with recent bladder infection.   stable.,  She was placed on Cephalexin and then switched to doxycycline.   She felt she did better at first on cephalexin but then did worse again.   Cx and Sensitivity shows E. Coli resistance to cephalosporines (also to Bactrim and Cipro) and she is sensitive to tetracycline and nitrofurantoin and gentamicin and meropenem.       Vision:swallow:   She feels this is stable.   She has diplopia laying on either side but not when sitting up.   Also some she reads a while or when very tired.     Dysphagia:   She notes dysphagia, especially with 'sticky' pills.   With EGD, she had some capsules stucjk in esophagus and some med's were discontinued.    Strictures are being stretched.    Mood/EtOH/Cognition:   Cymbalta was stopped and she was placed on Wellbutrin and Prozac but is doing worse.   The switch was made due to difficulty swallowing.    She still sometimes feels moderately depressed.     She has some apathy.   She drinks less alcohol than last year   She has mild difficulty with cognition, mostly occasional difficulty with short-term memory and verbal fluency (often gets syllables reversed).   She has trouble completing complicated tasks.  .  Fatigue:   She has fatigue and takes Provigil but it was stopped.  She was tried on Ritalin or Adderall once but she did not like the way that she felt when she took it.    Sleep:   She is sleeping poorly due to more pain.   She has some RLS but has not taken anything in maonths for it.   She notes difficulty falling asleep more than with sleep maintenance.   MS History:   She was diagnosed with multiple sclerosis in 2009. Before that time, she had noted some gait ataxia but had not had any imaging studies. In 2009, due to progressive hearing loss, she had an MRI of the brain and  then had a lumbar puncture that also confirmed the diagnosis.  I have some of her early MRI reports there are several foci noted in the cervical and thoracic spinal cord in the brain, there were multiple hyperintense foci, mostly in the periventricular deep white matter many of these were contrast enhancing on 07/26/2008. There was another enhancing lesion adjacent to C2 in the spinal cord.  Initially, she was placed on Avonex. She did not feel good and she  stopped. She then tried Copaxone but also stopped after a while. She moved here in 2014 and saw Dr. Shelia Media at [facility]. She was placed on Rebif. She tolerates Rebif well.      MRI reports from 12/04/2012  showed multiple foci in the spinal cord at C2, C3-C4, and C4-C5. There were also foci at T4, T7, T9 and T10. The MRI of the brain showed 2 nonenhancing foci not present from studies in 2011. She reports having an MRI at [facility] 2015 but we do not have those films.   REVIEW OF SYSTEMS:  Constitutional: No fevers, chills, sweats, or change in appetite.    She reports fatigue and insomnia Eyes: see above.  No eye pain  But some light sensitivity. Ear, nose and throat: No hearing loss, ear pain, nasal congestion, sore throat.   tinnitus Cardiovascular: No chest pain, palpitations Respiratory: No shortness of breath at  rest or with exertion.   Some wheezes, once on asthma med's Gastrointestinal: No nausea, vomiting, diarrhea, abdominal pain, fecal incontinence.  She has esophagitis Genitourinary: see above Musculoskeletal: see above  She also notes neck pain Integumentary: No rash, pruritus, skin lesions Neurological: as above Psychiatric: as above Endocrine: No palpitations, diaphoresis, change in appetite, change in weigh or increased thirst.  Often feels cold Hematologic/Lymphatic: No anemia, purpura, petechiae. Allergic/Immunologic: No itchy/runny eyes, nasal congestion, recent allergic reactions, rashes  ALLERGIES: Allergies  Allergen Reactions  . 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 Nsaids Other (See Comments)    Gastric bypass    HOME MEDICATIONS:  Current Outpatient Medications:  .  albuterol (PROVENTIL) (2.5 MG/3ML) 0.083% nebulizer solution, Take 3 mLs (2.5 mg total) by nebulization every 6 (six) hours as needed for wheezing or shortness of breath., Disp: 150 mL, Rfl: 1 .  baclofen (LIORESAL) 10 MG tablet, TAKE 1 TABLET BY MOUTH THREE TIMES A DAY, Disp: 90 tablet, Rfl: 5 .  Cholecalciferol (D2000 ULTRA STRENGTH) 50 MCG (2000 UT) CAPS, Take 1 capsule by mouth daily. , Disp: , Rfl:  .  diclofenac Sodium (VOLTAREN) 1 % GEL, Apply 4 g topically 4 (four) times daily., Disp: 4 g, Rfl: 1 .  DULoxetine (CYMBALTA) 60 MG capsule, TAKE 1 CAPSULE BY MOUTH EVERY DAY, Disp: 30 capsule, Rfl: 11 .  FLUoxetine (PROZAC) 20 MG capsule, Take 1 capsule (20 mg total) by mouth daily., Disp: 90 capsule, Rfl: 3 .  fluticasone (FLONASE) 50 MCG/ACT nasal spray, SPRAY 2 SPRAYS INTO EACH NOSTRIL EVERY DAY, Disp: 16 mL, Rfl: 1 .  hydrochlorothiazide (HYDRODIURIL) 25 MG tablet, TAKE 1 TABLET BY MOUTH EVERY DAY, Disp: 30 tablet, Rfl: 0 .  modafinil (PROVIGIL) 200 MG tablet, TAKE 1 TABLET BY MOUTH EVERY DAY, Disp: 30 tablet, Rfl: 5 .  Multiple Vitamin (MULTIVITAMIN WITH MINERALS) TABS  tablet, Take 1 tablet by mouth daily., Disp: , Rfl:  .  OCREVUS 300 MG/10ML injection, Inject 20 mLs (600 mg total) into the vein every 6 (six) months., Disp: 20 mL, Rfl: 1 .  olmesartan (BENICAR) 5 MG tablet, TAKE 2 TABLETS BY MOUTH EVERY DAY, Disp: 60 tablet, Rfl: 1 .  ondansetron (ZOFRAN-ODT) 8 MG disintegrating tablet, TAKE 1 TABLET (8 MG TOTAL) BY MOUTH EVERY 8 (EIGHT) HOURS AS NEEDED FOR NAUSEA OR VOMITING., Disp: 17 tablet, Rfl: 1 .  venlafaxine XR (EFFEXOR XR) 37.5 MG 24 hr capsule, Take 1 capsule (37.5 mg total) by mouth daily with breakfast., Disp: 30 capsule, Rfl: 1 .  oxybutynin (DITROPAN) 5 MG tablet, Take 1 tablet (5 mg total) by mouth 2 (two) times daily., Disp: 60 tablet, Rfl: 11  PAST MEDICAL HISTORY: Past Medical History:  Diagnosis Date  . Alcohol abuse   . Asthma   . Esophagitis   . GERD (gastroesophageal reflux disease)   . Headache   . Hearing loss   . Hypertension   . Multiple sclerosis (HCC)   . Pancreatitis   . Vision abnormalities     PAST SURGICAL HISTORY: Past Surgical History:  Procedure Laterality Date  . ANKLE FRACTURE SURGERY Bilateral   . CARPAL TUNNEL RELEASE Bilateral   . CHOLECYSTECTOMY    . COLONOSCOPY  11/2016   multiple  . ESOPHAGOGASTRODUODENOSCOPY  11/2016  . ESOPHAGOGASTRODUODENOSCOPY N/A 02/01/2017   Procedure: ESOPHAGOGASTRODUODENOSCOPY (EGD);  Surgeon: Iva Boop, MD;  Location[facility] ENDOSCOPY;  Service: Endoscopy;  Laterality: N/A;  . ESOPHAGOGASTRODUODENOSCOPY (EGD) WITH PROPOFOL N/A 02/04/2017   Procedure: ESOPHAGOGASTRODUODENOSCOPY (EGD) WITH PROPOFOL;  Surgeon: Meryl Dare, MD;  Location: [facility] ENDOSCOPY;  Service: Endoscopy;  Laterality: N/A;  . LAPAROSCOPIC GASTRIC SLEEVE RESECTION  2011  . ULNAR NERVE REPAIR Bilateral     FAMILY HISTORY: Family History  Problem Relation Age of Onset  . Cancer Mother   . Stroke Mother   . Cancer Father     SOCIAL HISTORY:  Social History   Socioeconomic History  . Marital  status: Married    Spouse name: Not on file  . Number of children: Not on file  . Years of education: Not on file  . Highest education level: Not on file  Occupational History  . Not on file  Tobacco Use  . Smoking status: Never Smoker  . Smokeless tobacco: Never Used  . Tobacco comment: second hand smoker when young. Heavy exposure.  Substance and Sexual Activity  . Alcohol use: Yes    Comment: Sts. she drinks beer, liquor daily, varying amts/fim  . Drug use: No  . Sexual activity: Not Currently  Other Topics Concern  . Not on file  Social History Narrative  . Not on file   Social Determinants of Health   Financial Resource Strain:   . Difficulty of Paying Living Expenses:   Food Insecurity:   . Worried About Programme researcher, broadcasting/film/video in the Last Year:   . Barista in the Last Year:   Transportation Needs:   . Freight forwarder (Medical):   Marland Kitchen Lack of Transportation (Non-Medical):   Physical Activity:   . Days of Exercise per Week:   . Minutes of Exercise per Session:   Stress:   . Feeling of Stress :   Social Connections:   . Frequency of Communication with Friends and Family:   . Frequency of Social Gatherings with Friends and Family:   . Attends Religious Services:   . Active Member of Clubs or Organizations:   . Attends Banker Meetings:   Marland Kitchen Marital Status:   Intimate Partner Violence:   . Fear of Current or Ex-Partner:   . Emotionally Abused:   Marland Kitchen Physically Abused:   . Sexually Abused:      PHYSICAL EXAM  Vitals:   12/02/19 0954  BP: (!) 145/97  Pulse: 88  Temp: 97.9 F (36.6 C)  Weight: 166 lb (75.3 kg)  Height:  (1.651 m)    Body mass index is 27.62 kg/m.   General: The patient is well-developed and well-nourished and in no acute distress.  Neck/HEENT:   She has mild tenderness in the cervical paraspinal muscles.      Extremities:  Mild pedal edema   Neurologic Exam  Mental status: The patient is alert and  oriented x 3 at the time of the examination. The patient has apparent normal recent and remote memory, with an apparently normal attention span and concentration ability.   Speech is normal.  Cranial nerves: Extraocular movements are full.  . She reports reduced sensation in the right face.  Facial strength was normal.  The trapezius strength is normal.      Motor:  Muscle bulk is normal.   Tone is normal in arms and mildly increased in the legs..  Strength is 5/5 in the arms and legs.  Sensory: She has symmetric sensation to touch and vibration in the arms and proximal legs.  Coordination: Cerebellar testing shows good finger-nose-finger and right heel-to-shin but mildly reduced left heel-to-shin.  Gait and station: Station is normal.  Her gait is mildly wide.  No foot drop.  Tandem gait is poor.  Romberg is negative..  Reflexes: Deep tendon reflexes are symmetric and mildly increased in legs bilaterally.         ASSESSMENT AND PLAN  Multiple sclerosis (HCC) - Plan: IgG, IgA, IgM, CBC with Differential/Platelet  High risk medication use - Plan: IgG, IgA, IgM, CBC with Differential/Platelet  Ataxic gait  Depression with anxiety  Urinary urgency  History of alcohol abuse   1.    For MS, continue Ocrevus.  We will check lab work for IgG/IgM and CBC with differential. 2.    Continue baclofen and Cymbalta.. 3.     DMV form filled out.. 4.    She will return to see me in 4 months or sooner if she has new or worsening neurologic symptoms.  45-minute office visit with the majority of the time spent face-to-face for history and physical, discussion/counseling and decision-making.  Additional time with record review and documentation.    Donyea Beverlin A. Epimenio Foot, MD, PhD, FAAN Certified in Neurology, Clinical Neurophysiology, Sleep Medicine, Pain Medicine and Neuroimaging Director, [facility] at [facility]  [facility] [address]  [address] [place], [place] [address] [phone]

## 2019-12-02 NOTE — Telephone Encounter (Signed)
Rx proair sent to pt pharmacy. 

## 2019-12-02 NOTE — Telephone Encounter (Signed)
Will you sen dit ProAir inhaler instead of albuterol

## 2019-12-02 NOTE — Telephone Encounter (Signed)
Called and LVM for pt to call office back. If she calls, she needs to come to our office to complete labs that Dr. Epimenio Foot ordered today at visit. She left without completing

## 2019-12-02 NOTE — Telephone Encounter (Signed)
-----   Message from Asa Lente, MD sent at 12/02/2019 10:51 AM EDT ----- Regarding: labs Although I walked her to the lab waiting room, she left without getting her lab work done.  Could you please give her a call so she comes in.  Thank you

## 2019-12-07 NOTE — Telephone Encounter (Signed)
Called, LVM for pt to call office.  Please relay that she missed doing her labs that Dr. Epimenio Foot ordered when she came in last week. He would like her to stop by at her earliest convenience to complete these.

## 2019-12-24 ENCOUNTER — Other Ambulatory Visit: Payer: Self-pay | Admitting: Physical Medicine and Rehabilitation

## 2019-12-24 ENCOUNTER — Other Ambulatory Visit: Payer: Self-pay | Admitting: Medical

## 2019-12-25 DIAGNOSIS — Z01818 Encounter for other preprocedural examination: Secondary | ICD-10-CM | POA: Diagnosis not present

## 2019-12-25 DIAGNOSIS — H25812 Combined forms of age-related cataract, left eye: Secondary | ICD-10-CM | POA: Diagnosis not present

## 2019-12-25 DIAGNOSIS — H2511 Age-related nuclear cataract, right eye: Secondary | ICD-10-CM | POA: Diagnosis not present

## 2019-12-25 DIAGNOSIS — H2512 Age-related nuclear cataract, left eye: Secondary | ICD-10-CM | POA: Diagnosis not present

## 2019-12-27 ENCOUNTER — Other Ambulatory Visit: Payer: Self-pay | Admitting: Medical

## 2020-01-01 ENCOUNTER — Ambulatory Visit: Payer: Federal, State, Local not specified - PPO | Admitting: Physical Medicine and Rehabilitation

## 2020-01-14 DIAGNOSIS — H2511 Age-related nuclear cataract, right eye: Secondary | ICD-10-CM | POA: Diagnosis not present

## 2020-01-19 NOTE — Telephone Encounter (Signed)
I called pt again to discuss. No answer, left a message asking her to call us back. Please see Emma's note from 12/07/19 and relay this to her. Pt is also due for a f/u in August of 2021 so please schedule this for her. I will also send pt a mychart message.

## 2020-01-27 ENCOUNTER — Other Ambulatory Visit: Payer: Self-pay

## 2020-01-27 ENCOUNTER — Encounter: Payer: Self-pay | Admitting: Physical Medicine and Rehabilitation

## 2020-01-27 ENCOUNTER — Encounter
Payer: Federal, State, Local not specified - PPO | Attending: Physical Medicine and Rehabilitation | Admitting: Physical Medicine and Rehabilitation

## 2020-01-27 VITALS — BP 144/93 | HR 86 | Temp 97.5°F | Ht 65.0 in | Wt 161.0 lb

## 2020-01-27 DIAGNOSIS — M48062 Spinal stenosis, lumbar region with neurogenic claudication: Secondary | ICD-10-CM | POA: Insufficient documentation

## 2020-01-27 DIAGNOSIS — M5416 Radiculopathy, lumbar region: Secondary | ICD-10-CM

## 2020-01-27 DIAGNOSIS — R5382 Chronic fatigue, unspecified: Secondary | ICD-10-CM | POA: Diagnosis not present

## 2020-01-27 DIAGNOSIS — G894 Chronic pain syndrome: Secondary | ICD-10-CM | POA: Diagnosis not present

## 2020-01-27 DIAGNOSIS — G9332 Myalgic encephalomyelitis/chronic fatigue syndrome: Secondary | ICD-10-CM

## 2020-01-27 DIAGNOSIS — G35 Multiple sclerosis: Secondary | ICD-10-CM | POA: Diagnosis not present

## 2020-01-27 MED ORDER — FLUOXETINE HCL 40 MG PO CAPS
40.0000 mg | ORAL_CAPSULE | Freq: Every day | ORAL | 3 refills | Status: DC
Start: 2020-01-27 — End: 2020-07-27

## 2020-01-27 NOTE — Progress Notes (Signed)
Subjective:    Patient ID: Catherine Hubbard, female    DOB: 06-13-1961, 59 y.o.   MRN: [id number]  HPI  [name] presents for a 3 month follow-up visit.    Lower back pain:  08/25/19: Her lower back pain is worst with extension and is present in her bilateral lower back. Since last visit she has been using a lumbar support brace which does provide some relief. She has been doing her HEP every day. She has had steroid injections in the past that did provide her with relief. She has pain in bilateral lower extremities as well in the anterior and lateral distributions. This pain is worse with movement-walking, standing, and when she lays flat.   10/06/19: On 1/21 she had a right sided L4-L5 epidural steroid injection with Dr. Wynn Banker. Her back pain is now much improved.    Bilateral hip pain: She has had some bilateral hip pain. She has been doing exercises at home for this. But she is now able to walk a lot more. She sometimes goes to the grocery store for 2 hours at a time to do her shopping as well as get exercise in walking. She still has difficulty with stairs.    Poor energy:  2/16: Her energy is now much improved and she has stopped taking the Ritalin.    Depression:  1/05: She say a psychiatrist in the past and was prescribed Prozac, Effexor, and Cymbalta which she found to be helpful for her depression. Last visit we restarted her on Prozac and this did provide some relief. She has not been following with the psychiatrist any more and does not want to at this time.    2/16: Her mood is much improved with these medications and she is not experiencing any adverse side effects. Her friends tell her she is like a new person and she has excellent energy. 1 month ago she says she felt she could not return to work but now she feels that she may be able to.    Left CMC joint pain: She notes increased swelling in this joint compared to the opposite side and it can sometimes pain her a  lot. She has a listed allergy to NSAIDs but said this was gastritis from ibuprofen more than 10 years ago. She has since taken ibuprofen without issues. Has never had any imaging of her hand or splint or steroid injection.   6/9: She was dong really well except for a month ago, when her back pain and lowe leg cramping returned. She feels soreness in both legs as well as in her back. She would be interested in repeat steroid injection. She feels like her fatigue has worsened and she is sleeping 16 hours per day. She does not have interest in anything. She is most bored in the afternoons. Her husband tries to watch TV with her in the afternoons to stimulate her. She used to like to talk to her neighbors but she has had some issues of her own. Her roommate is a godfearing woman and she does not want to be preached to from her.    Pain Inventory Average Pain 5 Pain Right Now 5 My pain is sharp, burning, stabbing, tingling and aching  In the last 24 hours, has pain interfered with the following? General activity 0 Relation with others 0 Enjoyment of life 0 What TIME of day is your pain at its worst? n/a Sleep (in general) Poor  Pain is worse with: unsure  Pain improves with: n/a Relief from Meds: no pain meds  Mobility walk without assistance walk with assistance use a cane use a walker ability to climb steps?  no do you drive?  yes  Function not employed: date last employed . I need assistance with the following:  household duties and shopping  Neuro/Psych bladder control problems tremor tingling trouble walking spasms dizziness depression anxiety  Prior Studies Any changes since last visit?  no  Physicians involved in your care Any changes since last visit?  no   Family History  Problem Relation Age of Onset  . Cancer Mother   . Stroke Mother   . Cancer Father    Social History   Socioeconomic History  . Marital status: Married    Spouse name: Not on file  .  Number of children: Not on file  . Years of education: Not on file  . Highest education level: Not on file  Occupational History  . Not on file  Tobacco Use  . Smoking status: Never Smoker  . Smokeless tobacco: Never Used  . Tobacco comment: second hand smoker when young. Heavy exposure.  Substance and Sexual Activity  . Alcohol use: Yes    Comment: Sts. she drinks beer, liquor daily, varying amts/fim  . Drug use: No  . Sexual activity: Not Currently  Other Topics Concern  . Not on file  Social History Narrative  . Not on file   Social Determinants of Health   Financial Resource Strain:   . Difficulty of Paying Living Expenses:   Food Insecurity:   . Worried About Programme researcher, broadcasting/film/video in the Last Year:   . Barista in the Last Year:   Transportation Needs:   . Freight forwarder (Medical):   Marland Kitchen Lack of Transportation (Non-Medical):   Physical Activity:   . Days of Exercise per Week:   . Minutes of Exercise per Session:   Stress:   . Feeling of Stress :   Social Connections:   . Frequency of Communication with Friends and Family:   . Frequency of Social Gatherings with Friends and Family:   . Attends Religious Services:   . Active Member of Clubs or Organizations:   . Attends Banker Meetings:   Marland Kitchen Marital Status:    Past Surgical History:  Procedure Laterality Date  . ANKLE FRACTURE SURGERY Bilateral   . CARPAL TUNNEL RELEASE Bilateral   . CHOLECYSTECTOMY    . COLONOSCOPY  11/2016   multiple  . ESOPHAGOGASTRODUODENOSCOPY  11/2016  . ESOPHAGOGASTRODUODENOSCOPY N/A 02/01/2017   Procedure: ESOPHAGOGASTRODUODENOSCOPY (EGD);  Surgeon: Iva Boop, MD;  Location: [facility] ENDOSCOPY;  Service: Endoscopy;  Laterality: N/A;  . ESOPHAGOGASTRODUODENOSCOPY (EGD) WITH PROPOFOL N/A 02/04/2017   Procedure: ESOPHAGOGASTRODUODENOSCOPY (EGD) WITH PROPOFOL;  Surgeon: Meryl Dare, MD;  Location: [facility] ENDOSCOPY;  Service: Endoscopy;  Laterality: N/A;  .  LAPAROSCOPIC GASTRIC SLEEVE RESECTION  2011  . ULNAR NERVE REPAIR Bilateral    Past Medical History:  Diagnosis Date  . Alcohol abuse   . Asthma   . Esophagitis   . GERD (gastroesophageal reflux disease)   . Headache   . Hearing loss   . Hypertension   . Multiple sclerosis (HCC)   . Pancreatitis   . Vision abnormalities    BP (!) 144/93   Pulse 86   Temp (!) 97.5 F (36.4 C)   Ht 5\' 5"  (1.651 m)   Wt 161 lb (73 kg)   LMP 08/13/2014  Comment: very few periods  SpO2 95%   BMI 26.79 kg/m   Opioid Risk Score:   Fall Risk Score:  `1  Depression screen PHQ 2/9  Depression screen PHQ 2/9 06/29/2019  Decreased Interest 3  Down, Depressed, Hopeless 3  PHQ - 2 Score 6  Altered sleeping 3  Tired, decreased energy 3  Change in appetite 3  Feeling bad or failure about yourself  3  Trouble concentrating 3  Moving slowly or fidgety/restless 3  Suicidal thoughts 3  PHQ-9 Score 27  Difficult doing work/chores Extremely dIfficult  Some recent data might be hidden    Review of Systems  Constitutional: Positive for appetite change.  Gastrointestinal: Positive for constipation and diarrhea.  Musculoskeletal: Positive for arthralgias, back pain, gait problem and myalgias.       Spasms  Skin: Positive for rash.  Neurological: Positive for dizziness and tremors.       Tingling  Psychiatric/Behavioral: Positive for dysphoric mood. The patient is nervous/anxious.   All other systems reviewed and are negative.      Objective:   Physical Exam  Gen: no distress, normal appearing HEENT: oral mucosa pink and moist, NCAT Cardio: Reg rate Chest: normal effort, normal rate of breathing Abd: soft, non-distended Ext: no edema Skin: intact Neuro/Musculoskeletal: Able to walk in office without falls, not using the walker this time. Able to stand for 5 minutes during conversation with me. Able to bend down and touch her toes with FROM. Diffuse tenderness along spine with gentle  palpation. Extension and oblique extension reproduce pain in lumbar spine. 5/5 strength in arms with the exception of 4+5 in right shoulder abduction and elbow flexion. 4-/5 in bilateral legs due to diffuse weakness. No focal motor or sensory deficits. + Slump test is now negative bilaterally.  Has increased swelling at left [facility] joint with increased tenderness to palpation compared to opposite side.  Range of motion of neck limited to 40 degrees bilateral, tingling in neck reproduced with neck flexion.  Psych: pleasant, normal affect, attention is significantly improved from last visit.         Assessment & Plan:  SRI CLEGG is a 59 y.o. female with history of alcohol abuse, multiple sclerosis and frequent falls, presenting for follow-up for her diffuse chronic pain and depression. She was last seen 3 months prior.    1) Poor energy: The Ritalin has really helped with her attention and energy and has helped her to be more active. Her energy is tremendously improved and she no longer requires the Ritalin which she has stopped taking on her own.    2) Depression: Continue Prozac and Effexor for depression.She is experiencing worsening anhedonia and excessive sleep. Increase Prozac to 40mg .    Keep a daily log of goals and check them off every day.   Maintain social connections in your life. Continue exercising with your neighbors.    Walk at least 15 minutes every day, preferably outside.    Look for local organizations and programs that can add meaning and friendship to your life.    Make an effort to do activities that you enjoy, such as going to the , shopping, [employer] (cost is an issue here), consider return to work in a sedentary job.      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  -I have personally reviewed Lumbar MRI which shows mild-moderate stenosis. On 1/21 she had a right sided L4-L5 epidural steroid injection with Dr. 2/21  with  great improvement in symptoms. Pain has returned in last month. Will refer for repeat injection.    4) Bilateral greater trochanteric pain syndrome: Continue HEP. Educated regarding etiology and treatment of condition.   5) Impaired mobility and ADLs secondary to Multiple Sclerosis, Major Depression Disorder, L4-L5 lumbar stenosis: Patient's overall mobility and mental health are have regressed a little since last visit. She has been trying to obtain disability for financial support and I provided her with the address and phone number of the social security office and advised her that I am happy to help fill out any paperwork she needs. That being said, I advised her that returning to work on a part-time basis may be very good for her, improving her quality of life, physical function, giving her purpose, and improving her financial situation. She is agreeable to this and feels that she may be able to return to work and will look for jobs that interest her and that will not be too strenuous for her (avoiding lifting heavy weights or climbing stairs). She is still nervous about falling at work.   6) Left CMC arthritis: Prescribed diclofenac gel and advised that patient may apply up to 4 times per day. If this does not provide relief asked that patient call our office and I can order an XR and provide a referral for fitting for a splint. If this does not provide relief, we can consider corticosteroid injection.     All questions were encouraged and answered. Follow up with me in 1 month post epidural steroid injection.

## 2020-01-28 ENCOUNTER — Telehealth: Payer: Self-pay

## 2020-01-28 NOTE — Telephone Encounter (Signed)
Patient husband called stating she is having continous pain. Please advise.

## 2020-01-28 NOTE — Telephone Encounter (Signed)
I will call him back now; thank you!

## 2020-02-09 ENCOUNTER — Encounter: Payer: Self-pay | Admitting: Physical Medicine & Rehabilitation

## 2020-02-09 ENCOUNTER — Other Ambulatory Visit: Payer: Self-pay | Admitting: Neurology

## 2020-02-09 ENCOUNTER — Encounter: Payer: Federal, State, Local not specified - PPO | Admitting: Physical Medicine & Rehabilitation

## 2020-02-09 ENCOUNTER — Other Ambulatory Visit: Payer: Self-pay | Admitting: Medical

## 2020-02-09 ENCOUNTER — Other Ambulatory Visit: Payer: Self-pay

## 2020-02-09 VITALS — BP 157/105 | HR 83 | Temp 97.2°F | Ht 65.0 in | Wt 162.0 lb

## 2020-02-09 DIAGNOSIS — M48062 Spinal stenosis, lumbar region with neurogenic claudication: Secondary | ICD-10-CM | POA: Diagnosis not present

## 2020-02-09 DIAGNOSIS — G35 Multiple sclerosis: Secondary | ICD-10-CM | POA: Diagnosis not present

## 2020-02-09 DIAGNOSIS — M5416 Radiculopathy, lumbar region: Secondary | ICD-10-CM

## 2020-02-09 DIAGNOSIS — G894 Chronic pain syndrome: Secondary | ICD-10-CM | POA: Diagnosis not present

## 2020-02-09 DIAGNOSIS — R5382 Chronic fatigue, unspecified: Secondary | ICD-10-CM | POA: Diagnosis not present

## 2020-02-09 NOTE — Patient Instructions (Signed)

## 2020-02-09 NOTE — Progress Notes (Signed)
  PROCEDURE RECORD [facility]   Name: Catherine Hubbard DOB:Apr 08, 1961 MRN: [id number]  Date:02/09/2020  Physician: Claudette Laws, MD    Nurse/CMA:Citlali Gautney RN  Allergies:  Allergies  Allergen Reactions  . 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 Nsaids Other (See Comments)    Gastric bypass    Consent Signed: Yes.    Is patient diabetic? No.  CBG today?   Pregnant: No. LMP: Patient's last menstrual period was 08/13/2014. (age 21-55)  Anticoagulants: no Anti-inflammatory: no Antibiotics: no  Procedure:right lumbar 4-5 transforaminal steroid injection Position: Prone Start Time: 12:22  End Time:12:25  Fluoro Time: 23 sec  RN/CMA Designer, multimedia    Time 11:59 12:32    BP 157/105 135/87    Pulse 83 91    Respirations 14 14    O2 Sat 96 98    S/S 6 6    Pain Level 8/10 unsure     D/C home with husband, patient A & O X 3, D/C instructions reviewed, and sits independently.

## 2020-02-09 NOTE — Progress Notes (Signed)
Right L4-5 Lumbar transforaminal epidural steroid injection under fluoroscopic guidance with contrast enhancement  Indication: R>L Lumbosacral radiculitis is not relieved by medication management or other conservative care and interfering with self-care and mobility.   Informed consent was obtained after describing risk and benefits of the procedure with the patient, this includes bleeding, bruising, infection, paralysis and medication side effects.  The patient wishes to proceed and has given written consent.  Patient was placed in prone position.  The lumbar area was marked and prepped with Betadine.  It was entered with a 25-gauge 1-1/2 inch needle and one mL of 1% lidocaine was injected into the skin and subcutaneous tissue.  Then a 22-gauge 3.5 spinal needle was inserted into the Right L4-5  intervertebral foramen under AP, lateral, and oblique view.  Once needle tip was within the foramen on lateral views an dnor exceeding 6 o clock position on th epedical on AP viewed Isovue 200 was inected x 18ml Then a solution containing one mL of 10 mg per mL dexamethasone and 2 mL of 1% lidocaine was injected.  The patient tolerated procedure well.  Post procedure instructions were given.  Please see post procedure form.  May consider retroneural approach

## 2020-02-18 ENCOUNTER — Other Ambulatory Visit: Payer: Self-pay

## 2020-02-18 ENCOUNTER — Ambulatory Visit (HOSPITAL_BASED_OUTPATIENT_CLINIC_OR_DEPARTMENT_OTHER)
Admission: RE | Admit: 2020-02-18 | Discharge: 2020-02-18 | Disposition: A | Discharge status: Home / Self Care | Payer: Federal, State, Local not specified - PPO | Source: Ambulatory Visit | Attending: Medical | Admitting: Medical

## 2020-02-18 ENCOUNTER — Encounter: Payer: Self-pay | Admitting: Medical

## 2020-02-18 ENCOUNTER — Ambulatory Visit: Payer: Federal, State, Local not specified - PPO | Admitting: Medical

## 2020-02-18 VITALS — BP 130/80 | HR 87 | Temp 98.4°F | Resp 18 | Ht 65.0 in | Wt 166.4 lb

## 2020-02-18 DIAGNOSIS — R0602 Shortness of breath: Secondary | ICD-10-CM | POA: Diagnosis not present

## 2020-02-18 DIAGNOSIS — R0600 Dyspnea, unspecified: Secondary | ICD-10-CM

## 2020-02-18 DIAGNOSIS — R062 Wheezing: Secondary | ICD-10-CM | POA: Diagnosis not present

## 2020-02-18 DIAGNOSIS — I10 Essential (primary) hypertension: Secondary | ICD-10-CM | POA: Diagnosis not present

## 2020-02-18 LAB — CBC WITH DIFFERENTIAL/PLATELET
Basophils Absolute: 0.1 10*3/uL (ref 0.0–0.1)
Basophils Relative: 2.2 % (ref 0.0–3.0)
Eosinophils Absolute: 0.2 10*3/uL (ref 0.0–0.7)
Eosinophils Relative: 4.7 % (ref 0.0–5.0)
HCT: 35.5 % — ABNORMAL LOW (ref 36.0–46.0)
Hemoglobin: 12 g/dL (ref 12.0–15.0)
Lymphocytes Relative: 29.1 % (ref 12.0–46.0)
Lymphs Abs: 1.5 10*3/uL (ref 0.7–4.0)
MCHC: 33.9 g/dL (ref 30.0–36.0)
MCV: 99.9 fl (ref 78.0–100.0)
Monocytes Absolute: 0.8 10*3/uL (ref 0.1–1.0)
Monocytes Relative: 16 % — ABNORMAL HIGH (ref 3.0–12.0)
Neutro Abs: 2.4 10*3/uL (ref 1.4–7.7)
Neutrophils Relative %: 48 % (ref 43.0–77.0)
Platelets: 320 10*3/uL (ref 150.0–400.0)
RBC: 3.55 Mil/uL — ABNORMAL LOW (ref 3.87–5.11)
RDW: 13.5 % (ref 11.5–15.5)
WBC: 5 10*3/uL (ref 4.0–10.5)

## 2020-02-18 LAB — COMPREHENSIVE METABOLIC PANEL
ALT: 14 U/L (ref 0–35)
AST: 24 U/L (ref 0–37)
Albumin: 4.2 g/dL (ref 3.5–5.2)
Alkaline Phosphatase: 65 U/L (ref 39–117)
BUN: 8 mg/dL (ref 6–23)
CO2: 30 mEq/L (ref 19–32)
Calcium: 9.7 mg/dL (ref 8.4–10.5)
Chloride: 92 mEq/L — ABNORMAL LOW (ref 96–112)
Creatinine, Ser: 0.67 mg/dL (ref 0.40–1.20)
GFR: 89.89 mL/min (ref >60.00)
Glucose, Bld: 80 mg/dL (ref 70–99)
Potassium: 5.8 mEq/L — ABNORMAL HIGH (ref 3.5–5.1)
Sodium: 129 mEq/L — ABNORMAL LOW (ref 135–145)
Total Bilirubin: 0.9 mg/dL (ref 0.2–1.2)
Total Protein: 6.6 g/dL (ref 6.0–8.3)

## 2020-02-18 LAB — LIPID PANEL
Cholesterol: 152 mg/dL (ref 0–200)
HDL: 90.4 mg/dL (ref >39.00)
LDL Cholesterol: 41 mg/dL (ref 0–99)
NonHDL: 61.92
Total CHOL/HDL Ratio: 2
Triglycerides: 107 mg/dL (ref 0.0–149.0)
VLDL: 21.4 mg/dL (ref 0.0–40.0)

## 2020-02-18 LAB — BRAIN NATRIURETIC PEPTIDE: Pro B Natriuretic peptide (BNP): 79 pg/mL (ref 0.0–100.0)

## 2020-02-18 MED ORDER — BUDESONIDE-FORMOTEROL FUMARATE 80-4.5 MCG/ACT IN AERO
2.0000 | INHALATION_SPRAY | Freq: Two times a day (BID) | RESPIRATORY_TRACT | 3 refills | Status: DC
Start: 2020-02-18 — End: 2020-07-11

## 2020-02-18 MED ORDER — PREDNISONE 10 MG PO TABS
ORAL_TABLET | ORAL | 0 refills | Status: DC
Start: 2020-02-18 — End: 2020-06-01

## 2020-02-18 NOTE — Patient Instructions (Addendum)
You have recent dyspnea and wheezing.  History of asthma in past and some response to albuterol neb treatments.  Will get chest x-ray today, CMP, CBC, and a BNP.  Prescribe Symbicort inhaler to use 2 inhalations twice daily.  Use albuterol if needed.  We will see how you respond to Symbicort and follow lab results.  Providing you with a 6-day taper dose of prednisone to use if needed.  Follow chest x-ray and labs to make sure no CHF or infectious component.  Follow-up date to be determined after studies.  Presently difficult to say whether or not you can travel until we are evaluate studies and assess your response to treatment.

## 2020-02-18 NOTE — Progress Notes (Signed)
Subjective:    Patient ID: Catherine Hubbard, female    DOB: 10/15/1960, 59 y.o.   MRN: [id number]  HPI  Pt in for some wheezing recently. Feeling some sob.  Pt states last 1-2 weeks happening more frequently. Thinks correlated with humidity.   No history smoking. Pt states no second hand exposure.  Hx of asthma. She has nebulizer at home. She does respond most of time.   When she has wheezing feel some light headed.   Hx of htn. Some time since I last saw her. She is fasting.     Review of Systems  Constitutional: Negative for chills, fatigue and fever.  Respiratory: Negative for cough, chest tightness, shortness of breath and wheezing.   Cardiovascular: Negative for chest pain and palpitations.  Gastrointestinal: Negative for abdominal pain.  Genitourinary: Negative for dysuria, flank pain and frequency.  Musculoskeletal: Negative for back pain.  Skin: Negative for rash.  Neurological:       When sob/wheezing transient dizziness.   Hematological: Negative for adenopathy. Does not bruise/bleed easily.  Psychiatric/Behavioral: Negative for behavioral problems and confusion.    Past Medical History:  Diagnosis Date   Alcohol abuse    Asthma    Esophagitis    GERD (gastroesophageal reflux disease)    Headache    Hearing loss    Hypertension    Multiple sclerosis (HCC)    Pancreatitis    Vision abnormalities      Social History   Socioeconomic History   Marital status: Married    Spouse name: Not on file   Number of children: Not on file   Years of education: Not on file   Highest education level: Not on file  Occupational History   Not on file  Tobacco Use   Smoking status: Never Smoker   Smokeless tobacco: Never Used   Tobacco comment: second hand smoker when young. Heavy exposure.  Vaping Use   Vaping Use: Never used  Substance and Sexual Activity   Alcohol use: Yes    Comment: Sts. she drinks beer, liquor daily, varying  amts/fim   Drug use: No   Sexual activity: Not Currently  Other Topics Concern   Not on file  Social History Narrative   Not on file   Social Determinants of Health   Financial Resource Strain:    Difficulty of Paying Living Expenses:   Food Insecurity:    Worried About Programme researcher, broadcasting/film/video in the Last Year:    Barista in the Last Year:   Transportation Needs:    Freight forwarder (Medical):    Lack of Transportation (Non-Medical):   Physical Activity:    Days of Exercise per Week:    Minutes of Exercise per Session:   Stress:    Feeling of Stress :   Social Connections:    Frequency of Communication with Friends and Family:    Frequency of Social Gatherings with Friends and Family:    Attends Religious Services:    Active Member of Clubs or Organizations:    Attends Engineer, structural:    Marital Status:   Intimate Partner Violence:    Fear of Current or Ex-Partner:    Emotionally Abused:    Physically Abused:    Sexually Abused:     Past Surgical History:  Procedure Laterality Date   ANKLE FRACTURE SURGERY Bilateral    CARPAL TUNNEL RELEASE Bilateral    CHOLECYSTECTOMY     COLONOSCOPY  11/2016  multiple   ESOPHAGOGASTRODUODENOSCOPY  11/2016   ESOPHAGOGASTRODUODENOSCOPY N/A 02/01/2017   Procedure: ESOPHAGOGASTRODUODENOSCOPY (EGD);  Surgeon: Iva Boop, MD;  Location[facility] ENDOSCOPY;  Service: Endoscopy;  Laterality: N/A;   ESOPHAGOGASTRODUODENOSCOPY (EGD) WITH PROPOFOL N/A 02/04/2017   Procedure: ESOPHAGOGASTRODUODENOSCOPY (EGD) WITH PROPOFOL;  Surgeon: Meryl Dare, MD;  Location: [facility] ENDOSCOPY;  Service: Endoscopy;  Laterality: N/A;   LAPAROSCOPIC GASTRIC SLEEVE RESECTION  2011   ULNAR NERVE REPAIR Bilateral     Family History  Problem Relation Age of Onset   Cancer Mother    Stroke Mother    Cancer Father     Allergies  Allergen Reactions   Ibuprofen Other (See Comments)    Cannot take  due to gastric bypass   Nsaids Other (See Comments)    Gastric bypass    Current Outpatient Medications on File Prior to Visit  Medication Sig Dispense Refill   albuterol (PROVENTIL) (2.5 MG/3ML) 0.083% nebulizer solution Take 3 mLs (2.5 mg total) by nebulization every 6 (six) hours as needed for wheezing or shortness of breath. 150 mL 1   albuterol (VENTOLIN HFA) 108 (90 Base) MCG/ACT inhaler TAKE 2 PUFFS BY MOUTH EVERY 6 HOURS AS NEEDED FOR WHEEZE OR SHORTNESS OF BREATH 90 g 2   baclofen (LIORESAL) 10 MG tablet TAKE 1 TABLET BY MOUTH THREE TIMES A DAY 90 tablet 5   Cholecalciferol (D2000 ULTRA STRENGTH) 50 MCG (2000 UT) CAPS Take 1 capsule by mouth daily.      diclofenac Sodium (VOLTAREN) 1 % GEL APPLY 4 G TOPICALLY 4 (FOUR) TIMES DAILY. 400 g 1   DULoxetine (CYMBALTA) 60 MG capsule TAKE 1 CAPSULE BY MOUTH EVERY DAY 30 capsule 11   FLUoxetine (PROZAC) 40 MG capsule Take 1 capsule (40 mg total) by mouth daily. 30 capsule 3   fluticasone (FLONASE) 50 MCG/ACT nasal spray SPRAY 2 SPRAYS INTO EACH NOSTRIL EVERY DAY 16 mL 3   hydrochlorothiazide (HYDRODIURIL) 25 MG tablet TAKE 1 TABLET BY MOUTH EVERY DAY 30 tablet 0   modafinil (PROVIGIL) 200 MG tablet TAKE 1 TABLET BY MOUTH EVERY DAY 30 tablet 3   Multiple Vitamin (MULTIVITAMIN WITH MINERALS) TABS tablet Take 1 tablet by mouth daily.     OCREVUS 300 MG/10ML injection Inject 20 mLs (600 mg total) into the vein every 6 (six) months. 20 mL 1   olmesartan (BENICAR) 5 MG tablet TAKE 2 TABLETS BY MOUTH EVERY DAY 60 tablet 1   ondansetron (ZOFRAN-ODT) 8 MG disintegrating tablet TAKE 1 TABLET (8 MG TOTAL) BY MOUTH EVERY 8 (EIGHT) HOURS AS NEEDED FOR NAUSEA OR VOMITING. 17 tablet 1   oxybutynin (DITROPAN) 5 MG tablet Take 1 tablet (5 mg total) by mouth 2 (two) times daily. 60 tablet 11   venlafaxine XR (EFFEXOR XR) 37.5 MG 24 hr capsule Take 1 capsule (37.5 mg total) by mouth daily with breakfast. 30 capsule 1   No current  facility-administered medications on file prior to visit.    BP (!) 141/97 (BP Location: Left Arm, Patient Position: Sitting, Cuff Size: Large)    Pulse 87    Temp 98.4 F (36.9 C) (Temporal)    Resp 18    Ht 5\' 5"  (1.651 m)    Wt 166 lb 6.4 oz (75.5 kg)    LMP 08/13/2014 Comment: very few periods   SpO2 99%    BMI 27.69 kg/m       Objective:   Physical Exam  General- No acute distress. Pleasant patient. Neck- Full range of  motion, no jvd Lungs- Clear, even and unlabored. Heart- regular rate and rhythm. Neurologic- CNII- XII grossly intact. Lower ext- no edema. Negative homans signs calfs symmetric.      Assessment & Plan:  [phone]  You have recent dyspnea and wheezing.  History of asthma in past and some response to albuterol neb treatments.  Will get chest x-ray today, CMP, CBC, and a BNP.  Prescribe Symbicort inhaler to use 2 inhalations twice daily.  Use albuterol if needed.  We will see how you respond to Symbicort and follow lab results.  Providing you with a 6-day taper dose of prednisone to use if needed.  Follow chest x-ray and labs to make sure no CHF or infectious component.  Follow-up date to be determined after studies.  Presently difficult to say whether or not you can travel until we are evaluate studies and assess your response to treatment.  [name], PA-C   Time spent with patient today was 30  minutes which consisted of chart review, discussing diagnosis, work up, treatment and documentation.

## 2020-02-19 ENCOUNTER — Telehealth: Payer: Self-pay | Admitting: Medical

## 2020-02-19 ENCOUNTER — Telehealth: Payer: Self-pay

## 2020-02-19 ENCOUNTER — Other Ambulatory Visit: Payer: Self-pay | Admitting: Medical

## 2020-02-19 MED ORDER — SODIUM POLYSTYRENE SULFONATE 15 GM/60ML PO SUSP: ORAL | 0 refills | Status: DC

## 2020-02-19 NOTE — Telephone Encounter (Signed)
Rx sent to [facility] since [facility] does not have rx.

## 2020-02-19 NOTE — Telephone Encounter (Signed)
Sent to [facility] and notifed pt.

## 2020-02-19 NOTE — Telephone Encounter (Signed)
Can and clarify powder they give him will include same sig. Not sure about dosing with powder. Want her to get same amount daily as I rx'd with solution.

## 2020-02-19 NOTE — Telephone Encounter (Signed)
Notified pt rx kayexalate sent to [facility] [address]. Called after hours.

## 2020-02-19 NOTE — Telephone Encounter (Signed)
See telephone encounter.

## 2020-02-19 NOTE — Telephone Encounter (Signed)
Called pharmacy-they states the kayexalate 15mg /60 mg liquid is on backorder. When speaking to pharmacist I asked about the powder and she states they do not have that as well. She mentioned we would need to send it to another pharmacy and see if they have it such as [facility]. She did mention they have a lb jar of the powder 15g is approx 4 tsps. However this is not exactly practical however if she needed it now and urgently they could fill that for her.

## 2020-02-24 ENCOUNTER — Other Ambulatory Visit: Payer: Self-pay | Admitting: Emergency Medicine

## 2020-02-24 ENCOUNTER — Other Ambulatory Visit: Payer: Federal, State, Local not specified - PPO

## 2020-02-24 DIAGNOSIS — I10 Essential (primary) hypertension: Secondary | ICD-10-CM

## 2020-02-26 ENCOUNTER — Encounter
Payer: Federal, State, Local not specified - PPO | Attending: Physical Medicine and Rehabilitation | Admitting: Physical Medicine and Rehabilitation

## 2020-02-26 DIAGNOSIS — M48062 Spinal stenosis, lumbar region with neurogenic claudication: Secondary | ICD-10-CM | POA: Insufficient documentation

## 2020-02-26 DIAGNOSIS — R5382 Chronic fatigue, unspecified: Secondary | ICD-10-CM | POA: Insufficient documentation

## 2020-02-26 DIAGNOSIS — G35 Multiple sclerosis: Secondary | ICD-10-CM | POA: Insufficient documentation

## 2020-02-26 DIAGNOSIS — G894 Chronic pain syndrome: Secondary | ICD-10-CM | POA: Insufficient documentation

## 2020-02-26 DIAGNOSIS — M5416 Radiculopathy, lumbar region: Secondary | ICD-10-CM | POA: Insufficient documentation

## 2020-03-12 ENCOUNTER — Other Ambulatory Visit: Payer: Self-pay | Admitting: Medical

## 2020-03-29 ENCOUNTER — Telehealth: Payer: Self-pay | Admitting: *Deleted

## 2020-03-29 NOTE — Telephone Encounter (Signed)
LVM for pt to call office. She is due for a follow up appt. 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 Mendel Ryder is requesting this.

## 2020-03-31 NOTE — Telephone Encounter (Addendum)
Called pt again at [phone]. Spoke with pt. She was not at home to look at schedule to make appt. Requesting I call her back Monday. She tried to call yesterday but could not get through on the phones.  She does not have access to [employer].  She spoke with Freddi Starr, RN in infusion suite and they obtained updated consent form for Ocrevus.   I followed up with Liane, RN who said they did not discuss consent form, this still needs to be done. She is scheduled for 04/13/20 for infusion next. Aware I will be calling pt Monday

## 2020-04-01 ENCOUNTER — Emergency Department (HOSPITAL_COMMUNITY): Payer: Federal, State, Local not specified - PPO

## 2020-04-01 ENCOUNTER — Emergency Department (HOSPITAL_COMMUNITY)
Admission: EM | Admit: 2020-04-01 | Discharge: 2020-04-02 | Disposition: A | Discharge status: Home / Self Care | Payer: Federal, State, Local not specified - PPO | Attending: Emergency Medicine | Admitting: Emergency Medicine

## 2020-04-01 ENCOUNTER — Encounter (HOSPITAL_COMMUNITY): Payer: Self-pay

## 2020-04-01 DIAGNOSIS — R29810 Facial weakness: Secondary | ICD-10-CM | POA: Diagnosis not present

## 2020-04-01 DIAGNOSIS — J45909 Unspecified asthma, uncomplicated: Secondary | ICD-10-CM | POA: Insufficient documentation

## 2020-04-01 DIAGNOSIS — Y999 Unspecified external cause status: Secondary | ICD-10-CM | POA: Diagnosis not present

## 2020-04-01 DIAGNOSIS — W19XXXA Unspecified fall, initial encounter: Secondary | ICD-10-CM | POA: Insufficient documentation

## 2020-04-01 DIAGNOSIS — R4781 Slurred speech: Secondary | ICD-10-CM | POA: Diagnosis not present

## 2020-04-01 DIAGNOSIS — Y929 Unspecified place or not applicable: Secondary | ICD-10-CM | POA: Diagnosis not present

## 2020-04-01 DIAGNOSIS — Z7951 Long term (current) use of inhaled steroids: Secondary | ICD-10-CM | POA: Insufficient documentation

## 2020-04-01 DIAGNOSIS — Z79899 Other long term (current) drug therapy: Secondary | ICD-10-CM | POA: Insufficient documentation

## 2020-04-01 DIAGNOSIS — S0101XA Laceration without foreign body of scalp, initial encounter: Secondary | ICD-10-CM | POA: Diagnosis not present

## 2020-04-01 DIAGNOSIS — S0990XA Unspecified injury of head, initial encounter: Secondary | ICD-10-CM | POA: Diagnosis not present

## 2020-04-01 DIAGNOSIS — S0181XA Laceration without foreign body of other part of head, initial encounter: Secondary | ICD-10-CM | POA: Insufficient documentation

## 2020-04-01 DIAGNOSIS — I10 Essential (primary) hypertension: Secondary | ICD-10-CM | POA: Insufficient documentation

## 2020-04-01 DIAGNOSIS — Y939 Activity, unspecified: Secondary | ICD-10-CM | POA: Diagnosis not present

## 2020-04-01 DIAGNOSIS — S199XXA Unspecified injury of neck, initial encounter: Secondary | ICD-10-CM | POA: Diagnosis not present

## 2020-04-01 NOTE — ED Triage Notes (Addendum)
Pt BIB [facility] for eval of fall w/ head injury. Not anticoagulated. Hx of MS, heavy +ETOH use, negative LOC. c-collar in place EMS. Pt w/ R sided facial droop that EMS reports family on scene states is baseline as well as some slurred speech, which family also states is baseline

## 2020-04-02 MED ORDER — OXYCODONE-ACETAMINOPHEN 5-325 MG PO TABS
1.0000 | ORAL_TABLET | Freq: Once | ORAL | Status: AC
  Administered 2020-04-02: 1 via ORAL
  Filled 2020-04-02: qty 1

## 2020-04-02 MED ORDER — LIDOCAINE HCL (PF) 1 % IJ SOLN
30.0000 mL | Freq: Once | INTRAMUSCULAR | Status: AC
  Administered 2020-04-02: 30 mL via INTRADERMAL
  Filled 2020-04-02: qty 30

## 2020-04-02 NOTE — ED Notes (Signed)
Patient given discharge instructions. Questions were answered. Patient verbalized understanding of discharge instructions and care at home.  

## 2020-04-02 NOTE — ED Provider Notes (Signed)
..Laceration Repair  Date/Time: 04/02/2020 11:42 AM Performed by: Gailen Shelter, PA Authorized by: Gailen Shelter, PA   Consent:    Consent obtained:  Verbal   Consent given by:  Patient   Risks discussed:  Infection, need for additional repair, pain, poor cosmetic result and poor wound healing   Alternatives discussed:  No treatment and delayed treatment Universal protocol:    Procedure explained and questions answered to patient or proxy's satisfaction: yes     Relevant documents present and verified: yes     Test results available and properly labeled: yes     Imaging studies available: yes     Required blood products, implants, devices, and special equipment available: yes     Site/side marked: yes     Immediately prior to procedure, a time out was called: yes     Patient identity confirmed:  Verbally with patient Anesthesia (see MAR for exact dosages):    Anesthesia method:  Local infiltration   Local anesthetic:  Lidocaine 1% w/o epi Laceration details:    Location:  Scalp   Scalp location:  Frontal   Length (cm):  3 Repair type:    Repair type:  Intermediate Exploration:    Hemostasis achieved with:  Direct pressure   Wound extent: no foreign bodies/material noted and no tendon damage noted     Contaminated: no   Treatment:    Area cleansed with:  Saline   Amount of cleaning:  Standard   Irrigation solution:  Sterile saline   Irrigation volume:  200   Irrigation method:  Pressure wash   Visualized foreign bodies/material removed: no   Skin repair:    Repair method:  Sutures   Suture size:  4-0   Suture material:  Prolene   Suture technique:  Simple interrupted   Number of sutures:  4 Approximation:    Approximation:  Close Post-procedure details:    Dressing:  Antibiotic ointment and non-adherent dressing   Patient tolerance of procedure:  Tolerated well, no immediate complications .Marland KitchenLaceration Repair  Date/Time: 04/02/2020 11:51 AM Performed by:  Gailen Shelter, PA Authorized by: Gailen Shelter, PA   Consent:    Consent obtained:  Verbal   Consent given by:  Patient   Risks discussed:  Infection, need for additional repair, pain, poor cosmetic result and poor wound healing   Alternatives discussed:  No treatment and delayed treatment Universal protocol:    Procedure explained and questions answered to patient or proxy's satisfaction: yes     Relevant documents present and verified: yes     Test results available and properly labeled: yes     Imaging studies available: yes     Required blood products, implants, devices, and special equipment available: yes     Site/side marked: yes     Immediately prior to procedure, a time out was called: yes     Patient identity confirmed:  Verbally with patient Anesthesia (see MAR for exact dosages):    Anesthesia method:  Local infiltration   Local anesthetic:  Lidocaine 1% w/o epi Laceration details:    Location:  Scalp   Scalp location:  Frontal   Length (cm):  3 Repair type:    Repair type:  Intermediate Exploration:    Hemostasis achieved with:  Direct pressure   Wound extent: no foreign bodies/material noted and no tendon damage noted     Contaminated: no   Treatment:    Area cleansed with:  Saline   Amount  of cleaning:  Standard   Irrigation solution:  Sterile saline   Irrigation method:  Pressure wash   Visualized foreign bodies/material removed: no   Skin repair:    Repair method:  Sutures   Suture size:  4-0   Suture material:  Prolene   Suture technique:  Simple interrupted   Number of sutures:  3 Approximation:    Approximation:  Close Post-procedure details:    Dressing:  Antibiotic ointment and non-adherent dressing   Patient tolerance of procedure:  Tolerated well, no immediate complications   Hemostasis achieved with direct pressure.  Patient tolerated procedure well for both lacerations.  Both lacerations were well irrigated and cleansed prior to repair.   Patient given good wound care precautions, follow-up instructions and dressed by nursing staff.   Solon Augusta [place], [place] 04/02/20 1152    Sabino Donovan, MD 04/02/20 1440

## 2020-04-02 NOTE — ED Provider Notes (Signed)
[facility] EMERGENCY DEPARTMENT Provider Note   CSN: [id number] Arrival date & time: 04/01/20  1915     History Chief Complaint  Patient presents with  . Fall  . Head Injury    Catherine Hubbard is a 59 y.o. female.  The history is provided by the patient.  Fall The current episode started 12 to 24 hours ago. The problem occurs rarely. The problem has been resolved. Associated symptoms include headaches. Pertinent negatives include no chest pain and no shortness of breath. Nothing aggravates the symptoms. Nothing relieves the symptoms. She has tried nothing for the symptoms. The treatment provided mild relief.  Head Injury Associated symptoms: headache and neck pain   Associated symptoms: no nausea and no vomiting        Past Medical History:  Diagnosis Date  . Alcohol abuse   . Asthma   . Esophagitis   . GERD (gastroesophageal reflux disease)   . Headache   . Hearing loss   . Hypertension   . Multiple sclerosis (HCC)   . Pancreatitis   . Vision abnormalities     Patient Active Problem List   Diagnosis Date Noted  . History of alcohol abuse 12/02/2019  . Multiple rib fractures 04/14/2019  . Multiple fractures of ribs, bilateral, initial encounter for closed fracture 04/14/2019  . Closed 3-part fracture of proximal humerus with routine healing 04/01/2019  . Multiple sclerosis exacerbation (HCC) 11/16/2018  . Exacerbation of multiple sclerosis (HCC) 11/16/2018  . Pneumonia 09/26/2018  . Elevated troponin 09/26/2018  . Acute hypokalemia 09/25/2018  . Arthritis of carpometacarpal joint 03/27/2018  . Nocturnal leg cramps 03/26/2018  . Syncope due to orthostatic hypotension 02/12/2018  . Diarrhea 02/08/2018  . Acute lower UTI 02/08/2018  . Hematoma of scalp 02/08/2018  . Neck pain 01/20/2018  . Foreign body in esophagus   . Esophageal stricture   . Intractable nausea and vomiting 01/31/2017  . Dysphagia   . Severe protein-calorie  malnutrition (HCC)   . Nausea & vomiting 01/30/2017  . Hypokalemia 01/30/2017  . Acute kidney injury (HCC) 01/30/2017  . Cystitis 01/30/2017  . Costochondritis 05/11/2016  . Trochanteric bursitis of both hips 03/28/2016  . Upper back pain 03/28/2016  . Bilateral low back pain with bilateral sciatica 07/13/2015  . Right hip pain 07/13/2015  . Lumbar radicular pain 03/21/2015  . Other fatigue 01/11/2015  . Dysesthesia 01/11/2015  . Urinary urgency 01/11/2015  . Multiple sclerosis (HCC) 10/06/2014  . Numbness 10/06/2014  . Ataxic gait 10/06/2014  . High risk medication use 10/06/2014  . Gastric bypass status for obesity 10/06/2014  . Cognitive changes 10/06/2014  . Depression with anxiety 10/06/2014  . Restless leg 10/06/2014  . Insomnia 10/06/2014  . Essential hypertension 10/06/2014  . Difficulty hearing 01/13/2014  . Other muscle spasm 01/13/2014    Past Surgical History:  Procedure Laterality Date  . ANKLE FRACTURE SURGERY Bilateral   . CARPAL TUNNEL RELEASE Bilateral   . CHOLECYSTECTOMY    . COLONOSCOPY  11/2016   multiple  . ESOPHAGOGASTRODUODENOSCOPY  11/2016  . ESOPHAGOGASTRODUODENOSCOPY N/A 02/01/2017   Procedure: ESOPHAGOGASTRODUODENOSCOPY (EGD);  Surgeon: Iva Boop, MD;  Location: [facility] ENDOSCOPY;  Service: Endoscopy;  Laterality: N/A;  . ESOPHAGOGASTRODUODENOSCOPY (EGD) WITH PROPOFOL N/A 02/04/2017   Procedure: ESOPHAGOGASTRODUODENOSCOPY (EGD) WITH PROPOFOL;  Surgeon: Meryl Dare, MD;  Location: [facility] ENDOSCOPY;  Service: Endoscopy;  Laterality: N/A;  . LAPAROSCOPIC GASTRIC SLEEVE RESECTION  2011  . ULNAR NERVE REPAIR Bilateral  OB History   No obstetric history on file.     Family History  Problem Relation Age of Onset  . Cancer Mother   . Stroke Mother   . Cancer Father     Social History   Tobacco Use  . Smoking status: Never Smoker  . Smokeless tobacco: Never Used  . Tobacco comment: second hand smoker when young. Heavy exposure.    Vaping Use  . Vaping Use: Never used  Substance Use Topics  . Alcohol use: Yes    Comment: Sts. she drinks beer, liquor daily, varying amts/fim  . Drug use: No    Home Medications Prior to Admission medications   Medication Sig Start Date End Date Taking? Authorizing Provider  albuterol (PROVENTIL) (2.5 MG/3ML) 0.083% nebulizer solution Take 3 mLs (2.5 mg total) by nebulization every 6 (six) hours as needed for wheezing or shortness of breath. 11/26/19   Saguier, Ramon Dredge, PA-C  albuterol (VENTOLIN HFA) 108 (90 Base) MCG/ACT inhaler TAKE 2 PUFFS BY MOUTH EVERY 6 HOURS AS NEEDED FOR WHEEZE OR SHORTNESS OF BREATH 02/09/20   Saguier, Ramon Dredge, PA-C  baclofen (LIORESAL) 10 MG tablet TAKE 1 TABLET BY MOUTH THREE TIMES A DAY 09/01/19   Lomax, Amy, NP  budesonide-formoterol (SYMBICORT) 80-4.5 MCG/ACT inhaler Inhale 2 puffs into the lungs 2 (two) times daily. 02/18/20   Saguier, Ramon Dredge, PA-C  Cholecalciferol (D2000 ULTRA STRENGTH) 50 MCG (2000 UT) CAPS Take 1 capsule by mouth daily.     [provider]  diclofenac Sodium (VOLTAREN) 1 % GEL APPLY 4 G TOPICALLY 4 (FOUR) TIMES DAILY. 12/24/19   Raulkar, Drema Pry, MD  DULoxetine (CYMBALTA) 60 MG capsule TAKE 1 CAPSULE BY MOUTH EVERY DAY 09/02/19   Lomax, Amy, NP  FLUoxetine (PROZAC) 40 MG capsule Take 1 capsule (40 mg total) by mouth daily. 01/27/20   Raulkar, Drema Pry, MD  fluticasone (FLONASE) 50 MCG/ACT nasal spray SPRAY 2 SPRAYS INTO EACH NOSTRIL EVERY DAY 12/24/19   Saguier, Ramon Dredge, PA-C  hydrochlorothiazide (HYDRODIURIL) 25 MG tablet TAKE 1 TABLET BY MOUTH EVERY DAY 07/09/19   Saguier, Ramon Dredge, PA-C  modafinil (PROVIGIL) 200 MG tablet TAKE 1 TABLET BY MOUTH EVERY DAY 02/09/20   Sater, Pearletha Furl, MD  Multiple Vitamin (MULTIVITAMIN WITH MINERALS) TABS tablet Take 1 tablet by mouth daily.    [provider]  OCREVUS 300 MG/10ML injection Inject 20 mLs (600 mg total) into the vein every 6 (six) months. 10/05/19   Lomax, Amy, NP  olmesartan (BENICAR)  5 MG tablet TAKE 2 TABLETS BY MOUTH EVERY DAY 03/14/20   Saguier, Ramon Dredge, PA-C  ondansetron (ZOFRAN-ODT) 8 MG disintegrating tablet TAKE 1 TABLET (8 MG TOTAL) BY MOUTH EVERY 8 (EIGHT) HOURS AS NEEDED FOR NAUSEA OR VOMITING. 09/08/19   Saguier, Ramon Dredge, PA-C  oxybutynin (DITROPAN) 5 MG tablet Take 1 tablet (5 mg total) by mouth 2 (two) times daily. 12/02/19   Sater, Pearletha Furl, MD  predniSONE (DELTASONE) 10 MG tablet Standard 6 day taper 02/18/20   Saguier, Ramon Dredge, PA-C  sodium polystyrene (KAYEXALATE) 15 GM/60ML suspension 60 ml po daily for 3 days. 02/19/20   Saguier, Ramon Dredge, PA-C  venlafaxine XR (EFFEXOR XR) 37.5 MG 24 hr capsule Take 1 capsule (37.5 mg total) by mouth daily with breakfast. 10/06/19   Raulkar, Drema Pry, MD    Allergies    Ibuprofen and Nsaids  Review of Systems   Review of Systems  Constitutional: Negative for chills and fever.  HENT: Negative for congestion and rhinorrhea.   Respiratory: Negative  for cough and shortness of breath.   Cardiovascular: Negative for chest pain and palpitations.  Gastrointestinal: Negative for diarrhea, nausea and vomiting.  Genitourinary: Negative for difficulty urinating and dysuria.  Musculoskeletal: Positive for neck pain. Negative for arthralgias and back pain.  Skin: Positive for wound. Negative for rash.  Neurological: Positive for headaches. Negative for light-headedness.    Physical Exam Updated Vital Signs BP (!) 155/92 (BP Location: Right Arm)   Pulse 81   Temp 98.8 F (37.1 C)   Resp 18   Ht 5\' 5"  (1.651 m) Comment: Simultaneous filing. User may not have seen previous data.  Wt 72.6 kg Comment: Simultaneous filing. User may not have seen previous data.  LMP 08/13/2014 Comment: very few periods  SpO2 97%   BMI 26.63 kg/m   Physical Exam Vitals and nursing note reviewed. Exam conducted with a chaperone present.  Constitutional:      General: She is not in acute distress.    Appearance: Normal appearance.  HENT:     Head:  Normocephalic.      Nose: No rhinorrhea.  Eyes:     General:        Right eye: No discharge.        Left eye: No discharge.     Conjunctiva/sclera: Conjunctivae normal.  Cardiovascular:     Rate and Rhythm: Normal rate and regular rhythm.  Pulmonary:     Effort: Pulmonary effort is normal. No respiratory distress.     Breath sounds: No stridor.  Abdominal:     General: Abdomen is flat. There is no distension.     Palpations: Abdomen is soft.  Musculoskeletal:        General: No tenderness or signs of injury.  Skin:    General: Skin is warm and dry.  Neurological:     General: No focal deficit present.     Mental Status: She is alert. Mental status is at baseline.     Motor: No weakness.  Psychiatric:        Mood and Affect: Mood normal.        Behavior: Behavior normal.     ED Results / Procedures / Treatments   Labs (all labs ordered are listed, but only abnormal results are displayed) Labs Reviewed - No data to display  EKG None  Radiology CT Head Wo Contrast  Result Date: 04/01/2020 CLINICAL DATA:  Fall head trauma EXAM: CT HEAD WITHOUT CONTRAST TECHNIQUE: Contiguous axial images were obtained from the base of the skull through the vertex without intravenous contrast. COMPARISON:  April 12, 2019 FINDINGS: Brain: No evidence of acute territorial infarction, hemorrhage, hydrocephalus,extra-axial collection or mass lesion/mass effect. There is dilatation the ventricles and sulci consistent with age-related atrophy. Low-attenuation changes in the deep white matter consistent with small vessel ischemia. Vascular: No hyperdense vessel or unexpected calcification. Skull: The skull is intact. No fracture or focal lesion identified. Sinuses/Orbits: The visualized paranasal sinuses and mastoid air cells are clear. The orbits and globes intact. Other: Soft tissue laceration seen over the right frontal skull. Cervical spine: Alignment: Physiologic Skull base and vertebrae: Visualized  skull base is intact. No atlanto-occipital dissociation. The vertebral body heights are well maintained. No fracture or pathologic osseous lesion seen. Soft tissues and spinal canal: The visualized paraspinal soft tissues are unremarkable. No prevertebral soft tissue swelling is seen. The spinal canal is grossly unremarkable, no large epidural collection or significant canal narrowing. Disc levels: cervical spine spondylosis is seen with disc osteophyte complex uncovertebral  osteophytes with neural foraminal narrowing. Upper chest: The lung apices are clear. Thoracic inlet is within normal limits. Other: None IMPRESSION: No acute intracranial abnormality. Soft tissue swelling with a laceration overlying the right frontal skull. Findings consistent with age related atrophy and chronic small vessel ischemia No acute fracture or malalignment of the spine. Electronically Signed   By: Jonna Clark M.D.   On: 04/01/2020 20:16   CT Cervical Spine Wo Contrast  Result Date: 04/01/2020 CLINICAL DATA:  Fall head trauma EXAM: CT HEAD WITHOUT CONTRAST TECHNIQUE: Contiguous axial images were obtained from the base of the skull through the vertex without intravenous contrast. COMPARISON:  April 12, 2019 FINDINGS: Brain: No evidence of acute territorial infarction, hemorrhage, hydrocephalus,extra-axial collection or mass lesion/mass effect. There is dilatation the ventricles and sulci consistent with age-related atrophy. Low-attenuation changes in the deep white matter consistent with small vessel ischemia. Vascular: No hyperdense vessel or unexpected calcification. Skull: The skull is intact. No fracture or focal lesion identified. Sinuses/Orbits: The visualized paranasal sinuses and mastoid air cells are clear. The orbits and globes intact. Other: Soft tissue laceration seen over the right frontal skull. Cervical spine: Alignment: Physiologic Skull base and vertebrae: Visualized skull base is intact. No atlanto-occipital  dissociation. The vertebral body heights are well maintained. No fracture or pathologic osseous lesion seen. Soft tissues and spinal canal: The visualized paraspinal soft tissues are unremarkable. No prevertebral soft tissue swelling is seen. The spinal canal is grossly unremarkable, no large epidural collection or significant canal narrowing. Disc levels: cervical spine spondylosis is seen with disc osteophyte complex uncovertebral osteophytes with neural foraminal narrowing. Upper chest: The lung apices are clear. Thoracic inlet is within normal limits. Other: None IMPRESSION: No acute intracranial abnormality. Soft tissue swelling with a laceration overlying the right frontal skull. Findings consistent with age related atrophy and chronic small vessel ischemia No acute fracture or malalignment of the spine. Electronically Signed   By: Jonna Clark M.D.   On: 04/01/2020 20:16    Procedures Procedures (including critical care time)  Medications Ordered in ED Medications  lidocaine (PF) (XYLOCAINE) 1 % injection 30 mL (30 mLs Intradermal Given by Other 04/02/20 0809)  oxyCODONE-acetaminophen (PERCOCET/ROXICET) 5-325 MG per tablet 1 tablet (1 tablet Oral Given 04/02/20 1610)    ED Course  I have reviewed the triage vital signs and the nursing notes.  Pertinent labs & imaging results that were available during my care of the patient were reviewed by me and considered in my medical decision making (see chart for details).    MDM Rules/Calculators/A&P                          Fall while intoxicated, CT scans done as part of triage work-up, negative for signs of intracranial injury, negative for cervical spine injury.  I reviewed these images as well.  I went to clear her collar at bedside but she still has significant midline tenderness, normal neurologic exam normal strength, mentating normally.  2 lacerations as documented above, repaired by PA wylder fondaw,, patient tolerated procedure well and  was discharged home in a cervical collar to follow-up with neurosurgery for reevaluation of her spine in 1 to 2 weeks. Final Clinical Impression(s) / ED Diagnoses Final diagnoses:  Injury of head, initial encounter  Fall, initial encounter  Facial laceration, initial encounter    Rx / DC Orders ED Discharge Orders    None       Cherlynn Perches  C, MD 04/02/20 [phone]

## 2020-04-04 NOTE — Telephone Encounter (Signed)
Tried calling pt. Phone rang, unable to leave message.

## 2020-04-05 NOTE — Telephone Encounter (Signed)
Tried calling patient again. LVM for her to call office back.

## 2020-04-07 ENCOUNTER — Encounter
Payer: Federal, State, Local not specified - PPO | Attending: Physical Medicine and Rehabilitation | Admitting: Physical Medicine and Rehabilitation

## 2020-04-07 DIAGNOSIS — G894 Chronic pain syndrome: Secondary | ICD-10-CM | POA: Insufficient documentation

## 2020-04-07 DIAGNOSIS — G35 Multiple sclerosis: Secondary | ICD-10-CM | POA: Insufficient documentation

## 2020-04-07 DIAGNOSIS — R5382 Chronic fatigue, unspecified: Secondary | ICD-10-CM | POA: Insufficient documentation

## 2020-04-07 DIAGNOSIS — M48062 Spinal stenosis, lumbar region with neurogenic claudication: Secondary | ICD-10-CM | POA: Insufficient documentation

## 2020-04-07 DIAGNOSIS — M5416 Radiculopathy, lumbar region: Secondary | ICD-10-CM | POA: Insufficient documentation

## 2020-04-13 DIAGNOSIS — G35 Multiple sclerosis: Secondary | ICD-10-CM | POA: Diagnosis not present

## 2020-04-13 NOTE — Telephone Encounter (Signed)
Pt here today for Ocrevus infusion. She signed updated consent form. I faxed back to [employer] at [phone]. Received fax confirmation. Scheduled follow up appt for pt 04/14/20 at 930am with Dr. Epimenio Foot. Gave her appt card reminder. Asked she check in by 9am. Pt verbalized understanding.

## 2020-04-14 ENCOUNTER — Ambulatory Visit: Payer: Self-pay | Admitting: Neurology

## 2020-05-31 NOTE — Progress Notes (Signed)
PATIENT: Catherine Hubbard DOB: [birth year]  REASON FOR VISIT: follow up HISTORY FROM: patient  Chief Complaint  Patient presents with  . Follow-up    rm 1  . Multiple Sclerosis    Pt said her legs and hip have been painful and sore     HISTORY OF PRESENT ILLNESS: Today 06/01/20 Catherine Hubbard is a 59 y.o. female here today for follow up for RRMS. She continues Ocrevus infusions. Last infusion was 04/13/2020.   She denies new or exacerbating symptoms. Gait is fairly stable. She fell about a month ago. She feels that her legs got weak and she fell forward. She uses her cane or walker. Strength waxes and wanes. She takes Baclofen up to three times daily for spasms. She continues modafinil 200mg  daily. She continues oxybutynin for urinary frequency and feel that it helps.   She continues to have constant pain. Hips and low back are worst areas. She is seeing pain management who has started Effexor and Prozac. They also ordered two ESI that were helpful. Last [facility] 01/2020. She has missed last two follow up visits with pain management. Cymbalta was discontinued by PCP. Mood is stable. Sleep waxes and wanes. Usually she is sleeping fairly well but feels she has been clenching her teeth more over the past week. She has to drive her roommate to work in the mornings. She denies regularly ETOH. She may have had 6 beers over the past 3 months.    HISTORY: (copied from Dr 02/2020 note on 12/02/2019)  12/04/2019 is a 59 y.o. woman with relapsing remitting MS.    Update 12/02/2019: She feels she has done well on Ocrevus and denies any new exacerbations.   She is walking reasonably well not needing a cane and has no recent falls.   Strength is mildly weaker in her left leg but hands more symmetric.      She denies numbness.   Vision is doing well.    She feels bladder function is unchanged with urinary urgency and incontinence.    Last year, she had a fall and developed a left  parietal lobe hematoma.    She reports she is rarely drinking ETOH now.    Update 10/28/2018: Yesterday, she went to the kitchen after getting up.  While walking back, she was veering and felt unsteady.   She went back to bed.  A little later she got up and while walking back from her shower, she had a rotational vertigo and she fell.     She hit her head and went to the ED.   There was no LOC before or with the fall.  CT showed a left parietal hematoma and stable volume loss and white matter changes form MS.     She has shooting pains in her face and numbness in her hands and feet.    No change in bladder and occasional urge incontinence.   She has had stomach pain since an EGD and also had a CT abdomen showing chronic DJD, chronic L1 compression deformity and old pubic rami fracture.  No free fluid or acute change.   She has had gastric sleeve surgery.      In January, copper was low normal at 72.     Update 08/26/2018: Her MS seems mostly stable.   She just had her Ocrevus infusion yesterday.   She tolerated it well and feels better since the infusion.    A few days ago, she fell  reaching for her Ipad ona table and hit her forehead requiring stitches.   She still reports pain there.   No LOC.    She has not had any recent episode of syncope (last one summer 2019).  Her gait has done worse the last year, requiring her cane more.  She has occasional stumbles and rare falls.   Balance is off.   Her legs seem mildly weak, maybe right mildly worse.   She has numbness in her legs.    She notes more edema in her legs.   She is trying to cut back on salt and she sometimes wears compression stockings.  She has urinary frequency with urge incontinence.   Some fecal incontinence.  She has a recent UTI.     Update 03/27/2018: She had syncope 2 months ago.  She was looking out the window and then woke up on the floor with her boss looking over her.    She reportedly hit her head on her way down.   She is not  sure she lost consciousness but others toild her she did for just a few seconds and woke back up as soon as someone touched her head.    She also tripped 03/02/2018 and broke 2 ribs on her right when she slipped on 'goose crap'.    She was taken to [facility] and was admitted.   Of note, she had GI issues and was dehydrated and also had a UTI, hyponatemia, hypokalemia and mild hypoglycemia.     She is trying to do better with fluids.     Echo showed LVH and grade 1 diastolic dysfunction.  She feels her MS is mostly stable.   She is on Ocrevus (last one June, next one December 2019).   She stopped many of the symptomatic medications   Gait is ok.   She wears flats as low heels may have increased fall risk.  She reports fatigue but feels better than she did a couple months ago.  She is sleeping ok most nights.   She notes urinary urgency but not bad enough to take a medication for it.   Update 01/20/2018: She has been on ocrelizumab and just had her second dose of the medication.   She has tolerated it well and has not had any exacerbations.    She feels stable for the most part but has had more falls, usually due to stumbling or tripping.   Gait does not seem that different to her.    She denies any change in strength or sensation.  She is noting urinary frequency with some burning and is concerned about a urinary tract infection.  She has had these in the past.  She gets LBP helped by muscle relaxants.  The pain is midline and radiates to the proximal legs.  She also has pain in her shoulders and arms.   She notes spasms in hr neck.       She has esophagitis and is supposed to have a stretch procedure soon.      She also has a h/o gastric bypass  Update 07/22/2017:   She had her first dose of ocrelizumab in November. She tolerated it well and there were no complications. Her next infusion will be at the end of April. For the most part, she feels the MS has been stable. However, she did note some  word finding difficulties last week. This is better this week.    She felt mildly stronger since  the infusion.   She feels off balanced, especially if she turns fast or someone bumps into her.   She needs to hold on in the shower.  Her right leg is weaker than her left and sometimes has a foot drop, esp if walking longer distances.     She is doing Geophysicist/field seismologist yoga.   She notes spasticity in legs at night, helped by valium.    Late last month,  she noted a few days where she had trouble coming up with the right word.   She feels she is doing better than last week ans almost back to baseline.    Her bladder is about the same.     Vision is mostly stable.   She lost her prescription glasses and uses readers to read.    She notes pain, mostly in the mid back.   Yoga has not helped the pain much.    She notes sometimes bruising more easily, especially after scratching.    Mood is doing ok, helped by duloxetine and Wellbutrin.   Drinking is better.       From 10/09/2016:  MS:   She is on Rebif and tolerates it well.    However, her last injection was 08/17/2016 and she has not yet gotten refill.  She denies any definite exacerbations but she feels balance, footdrop and writing are worse.   Gait/strength/sensation:   She notes a right foot drop and over last year the left leg is also bothering her more.   She reports poor balance .   She does not need a cane.  She has spasticity in both legs, worse on the right and she sometimes gets spasms that are worse at night.      Sometimes she feels like she is being pushed to the right as she walks.   She has tingling in her hands and face.  Baclofen has helped spasticity some.   Cyclobenzaprine has not helped spasms.     Bladder:   She reports urinary frequency and occasional incontinence.   She feels bladder function is worse with recent bladder infection.   stable.,  She was placed on Cephalexin and then switched to doxycycline.   She felt she did better  at first on cephalexin but then did worse again.   Cx and Sensitivity shows E. Coli resistance to cephalosporines (also to Bactrim and Cipro) and she is sensitive to tetracycline and nitrofurantoin and gentamicin and meropenem.       Vision:swallow:   She feels this is stable.   She has diplopia laying on either side but not when sitting up.   Also some she reads a while or when very tired.     Dysphagia:   She notes dysphagia, especially with 'sticky' pills.   With EGD, she had some capsules stucjk in esophagus and some med's were discontinued.    Strictures are being stretched.    Mood/EtOH/Cognition:   Cymbalta was stopped and she was placed on Wellbutrin and Prozac but is doing worse.   The switch was made due to difficulty swallowing.    She still sometimes feels moderately depressed.     She has some apathy.   She drinks less alcohol than last year   She has mild difficulty with cognition, mostly occasional difficulty with short-term memory and verbal fluency (often gets syllables reversed).  She has trouble completing complicated tasks.  .  Fatigue:   She has fatigue and takes Provigil but it was  stopped.  She was tried on Ritalin or Adderall once but she did not like the way that she felt when she took it.    Sleep:   She is sleeping poorly due to more pain.   She has some RLS but has not taken anything in maonths for it.   She notes difficulty falling asleep more than with sleep maintenance.   MS History:   She was diagnosed with multiple sclerosis in 2009. Before that time, she had noted some gait ataxia but had not had any imaging studies. In 2009, due to progressive hearing loss, she had an MRI of the brain and  then had a lumbar puncture that also confirmed the diagnosis.  I have some of her early MRI reports there are several foci noted in the cervical and thoracic spinal cord in the brain, there were multiple hyperintense foci, mostly in the periventricular deep white matter many of  these were contrast enhancing on 07/26/2008. There was another enhancing lesion adjacent to C2 in the spinal cord.  Initially, she was placed on Avonex. She did not feel good and she stopped. She then tried Copaxone but also stopped after a while. She moved here in 2014 and saw Dr. Renne Crigler at [facility]. She was placed on Rebif. She tolerates Rebif well.      MRI reports from 12/04/2012  showed multiple foci in the spinal cord at C2, C3-C4, and C4-C5. There were also foci at T4, T7, T9 and T10. The MRI of the brain showed 2 nonenhancing foci not present from studies in 2011. She reports having an MRI at [facility] 2015 but we do not have those films.    REVIEW OF SYSTEMS: Out of a complete 14 system review of symptoms, the patient complains only of the following symptoms, chronic pain, falls, imbalance, abnormal gait, anxiety, and all other reviewed systems are negative.   ALLERGIES: Allergies  Allergen Reactions  . 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 Nsaids Other (See Comments)    Gastric bypass    HOME MEDICATIONS: Outpatient Medications Prior to Visit  Medication Sig Dispense Refill  . albuterol (PROVENTIL) (2.5 MG/3ML) 0.083% nebulizer solution Take 3 mLs (2.5 mg total) by nebulization every 6 (six) hours as needed for wheezing or shortness of breath. 150 mL 1  . albuterol (VENTOLIN HFA) 108 (90 Base) MCG/ACT inhaler TAKE 2 PUFFS BY MOUTH EVERY 6 HOURS AS NEEDED FOR WHEEZE OR SHORTNESS OF BREATH 90 g 2  . budesonide-formoterol (SYMBICORT) 80-4.5 MCG/ACT inhaler Inhale 2 puffs into the lungs 2 (two) times daily. 1 Inhaler 3  . Cholecalciferol (D2000 ULTRA STRENGTH) 50 MCG (2000 UT) CAPS Take 1 capsule by mouth daily.     . diclofenac Sodium (VOLTAREN) 1 % GEL APPLY 4 G TOPICALLY 4 (FOUR) TIMES DAILY. (Patient taking differently: Apply 4 g topically 4 (four) times daily. PRN) 400 g 1  . FLUoxetine (PROZAC) 40 MG capsule Take 1 capsule (40 mg total) by  mouth daily. 30 capsule 3  . fluticasone (FLONASE) 50 MCG/ACT nasal spray SPRAY 2 SPRAYS INTO EACH NOSTRIL EVERY DAY 16 mL 3  . modafinil (PROVIGIL) 200 MG tablet TAKE 1 TABLET BY MOUTH EVERY DAY 30 tablet 3  . Multiple Vitamin (MULTIVITAMIN WITH MINERALS) TABS tablet Take 1 tablet by mouth daily.    . OCREVUS 300 MG/10ML injection Inject 20 mLs (600 mg total) into the vein every 6 (six) months. 20 mL 1  .  olmesartan (BENICAR) 5 MG tablet TAKE 2 TABLETS BY MOUTH EVERY DAY 60 tablet 5  . ondansetron (ZOFRAN-ODT) 8 MG disintegrating tablet TAKE 1 TABLET (8 MG TOTAL) BY MOUTH EVERY 8 (EIGHT) HOURS AS NEEDED FOR NAUSEA OR VOMITING. 17 tablet 1  . oxybutynin (DITROPAN) 5 MG tablet Take 1 tablet (5 mg total) by mouth 2 (two) times daily. 60 tablet 11  . venlafaxine XR (EFFEXOR XR) 37.5 MG 24 hr capsule Take 1 capsule (37.5 mg total) by mouth daily with breakfast. 30 capsule 1  . baclofen (LIORESAL) 10 MG tablet TAKE 1 TABLET BY MOUTH THREE TIMES A DAY 90 tablet 5  . DULoxetine (CYMBALTA) 60 MG capsule TAKE 1 CAPSULE BY MOUTH EVERY DAY 30 capsule 11  . hydrochlorothiazide (HYDRODIURIL) 25 MG tablet TAKE 1 TABLET BY MOUTH EVERY DAY 30 tablet 0  . predniSONE (DELTASONE) 10 MG tablet Standard 6 day taper 21 tablet 0  . sodium polystyrene (KAYEXALATE) 15 GM/60ML suspension 60 ml po daily for 3 days. 180 mL 0   No facility-administered medications prior to visit.    PAST MEDICAL HISTORY: Past Medical History:  Diagnosis Date  . Alcohol abuse   . Asthma   . Esophagitis   . GERD (gastroesophageal reflux disease)   . Headache   . Hearing loss   . Hypertension   . Multiple sclerosis (HCC)   . Pancreatitis   . Vision abnormalities     PAST SURGICAL HISTORY: Past Surgical History:  Procedure Laterality Date  . ANKLE FRACTURE SURGERY Bilateral   . CARPAL TUNNEL RELEASE Bilateral   . CHOLECYSTECTOMY    . COLONOSCOPY  11/2016   multiple  . ESOPHAGOGASTRODUODENOSCOPY  11/2016  .  ESOPHAGOGASTRODUODENOSCOPY N/A 02/01/2017   Procedure: ESOPHAGOGASTRODUODENOSCOPY (EGD);  Surgeon: Iva Boop, MD;  Location: [facility] ENDOSCOPY;  Service: Endoscopy;  Laterality: N/A;  . ESOPHAGOGASTRODUODENOSCOPY (EGD) WITH PROPOFOL N/A 02/04/2017   Procedure: ESOPHAGOGASTRODUODENOSCOPY (EGD) WITH PROPOFOL;  Surgeon: Meryl Dare, MD;  Location[facility] ENDOSCOPY;  Service: Endoscopy;  Laterality: N/A;  . LAPAROSCOPIC GASTRIC SLEEVE RESECTION  2011  . ULNAR NERVE REPAIR Bilateral     FAMILY HISTORY: Family History  Problem Relation Age of Onset  . Cancer Mother   . Stroke Mother   . Cancer Father     SOCIAL HISTORY: Social History   Socioeconomic History  . Marital status: Married    Spouse name: Not on file  . Number of children: Not on file  . Years of education: Not on file  . Highest education level: Not on file  Occupational History  . Not on file  Tobacco Use  . Smoking status: Never Smoker  . Smokeless tobacco: Never Used  . Tobacco comment: second hand smoker when young. Heavy exposure.  Vaping Use  . Vaping Use: Never used  Substance and Sexual Activity  . Alcohol use: Yes    Comment: Sts. she drinks beer, liquor daily, varying amts/fim  . Drug use: No  . Sexual activity: Not Currently  Other Topics Concern  . Not on file  Social History Narrative  . Not on file   Social Determinants of Health   Financial Resource Strain:   . Difficulty of Paying Living Expenses: Not on file  Food Insecurity:   . Worried About Programme researcher, broadcasting/film/video in the Last Year: Not on file  . Ran Out of Food in the Last Year: Not on file  Transportation Needs:   . Lack of Transportation (Medical): Not on file  .  Lack of Transportation (Non-Medical): Not on file  Physical Activity:   . Days of Exercise per Week: Not on file  . Minutes of Exercise per Session: Not on file  Stress:   . Feeling of Stress : Not on file  Social Connections:   . Frequency of Communication with Friends and  Family: Not on file  . Frequency of Social Gatherings with Friends and Family: Not on file  . Attends Religious Services: Not on file  . Active Member of Clubs or Organizations: Not on file  . Attends Banker Meetings: Not on file  . Marital Status: Not on file  Intimate Partner Violence:   . Fear of Current or Ex-Partner: Not on file  . Emotionally Abused: Not on file  . Physically Abused: Not on file  . Sexually Abused: Not on file      PHYSICAL EXAM  Vitals:   06/01/20 0942  BP: (!) 130/91  Pulse: 76  Weight: 160 lb (72.6 kg)  Height: 5\' 3"  (1.6 m)   Body mass index is 28.34 kg/m.  Generalized: Well developed, in no acute distress  Cardiology: normal rate and rhythm, no murmur noted Respiratory: clear to auscultation bilaterally  Neurological examination  Mentation: Alert oriented to time, place, history taking. Follows all commands speech and language fluent Cranial nerve II-XII: Pupils were equal round reactive to light. Extraocular movements were full, visual field were full on confrontational test. Facial sensation and strength were normal. Uvula tongue midline. Head turning and shoulder shrug  were normal and symmetric. Motor: The motor testing reveals 5 over 5 strength of all 4 extremities. Good symmetric motor tone is noted throughout.  Sensory: Sensory testing is intact to soft touch on all 4 extremities. No evidence of extinction is noted.  Gait and station: Gait is slightly wide. Tandem not attempted today. She is using four pronged cane    DIAGNOSTIC DATA (LABS, IMAGING, TESTING) - I reviewed patient records, labs, notes, testing and imaging myself where available.  No flowsheet data found.   Lab Results  Component Value Date   WBC 5.0 02/18/2020   HGB 12.0 02/18/2020   HCT 35.5 (L) 02/18/2020   MCV 99.9 02/18/2020   PLT 320.0 02/18/2020      Component Value Date/Time   NA 129 (L) 02/18/2020 1123   NA 140 06/02/2019 1549   K 5.8 (H)  02/18/2020 1123   CL 92 (L) 02/18/2020 1123   CO2 30 02/18/2020 1123   GLUCOSE 80 02/18/2020 1123   BUN 8 02/18/2020 1123   BUN 15 06/02/2019 1549   CREATININE 0.67 02/18/2020 1123   CREATININE 0.62 11/27/2018 1505   CALCIUM 9.7 02/18/2020 1123   PROT 6.6 02/18/2020 1123   PROT 6.8 06/02/2019 1549   ALBUMIN 4.2 02/18/2020 1123   ALBUMIN 4.3 06/02/2019 1549   AST 24 02/18/2020 1123   ALT 14 02/18/2020 1123   ALKPHOS 65 02/18/2020 1123   BILITOT 0.9 02/18/2020 1123   BILITOT 0.5 06/02/2019 1549   GFRNONAA 87 06/02/2019 1549   GFRAA 100 06/02/2019 1549   Lab Results  Component Value Date   CHOL 152 02/18/2020   HDL 90.40 02/18/2020   LDLCALC 41 02/18/2020   TRIG 107.0 02/18/2020   CHOLHDL 2 02/18/2020   No results found for: HGBA1C Lab Results  Component Value Date   VITAMINB12 1,149 (H) 08/28/2018   Lab Results  Component Value Date   TSH 0.973 08/26/2018       ASSESSMENT AND  PLAN 59 y.o. year old female  has a past medical history of Alcohol abuse, Asthma, Esophagitis, GERD (gastroesophageal reflux disease), Headache, Hearing loss, Hypertension, Multiple sclerosis (HCC), Pancreatitis, and Vision abnormalities. here with     ICD-10-CM   1. Relapsing remitting multiple sclerosis (HCC)  G35 CBC with Differential/Platelets    IgG, IgA, IgM  2. High risk medication use  Z79.899 CBC with Differential/Platelets    IgG, IgA, IgM  3. Ataxic gait  R26.0   4. Depression with anxiety  F41.8   5. Urinary urgency  R39.15   6. Other fatigue  R53.83   7. History of alcohol abuse  F10.11   8. Other chronic pain  G89.29     Catherine is doing fairly well today. She continues to have chronic pain of low back and hips. She is seeing pain management. She has missed the past two visits. I have encouraged her to call pain management and discuss. ESI's have been helpful. She will continue Ocrevus every 6 months. We will update labs today. She will continue baclofen, modafinil and  oxybutynin as prescribed. She will continue close follow up with PCP. Follow up with Dr Epimenio Foot in 6 months. She verbalizes understanding and agreement with this plan.    Orders Placed This Encounter  Procedures  . CBC with Differential/Platelets  . IgG, IgA, IgM     Meds ordered this encounter  Medications  . baclofen (LIORESAL) 10 MG tablet    Sig: Take 1 tablet (10 mg total) by mouth 3 (three) times daily.    Dispense:  90 tablet    Refill:  5    Order Specific Question:   Supervising Provider    Answer:   Anson Fret [id number]      I spent 30 minutes with the patient. 50% of this time was spent counseling and educating patient on plan of care and medications.     Shawnie Dapper, FNP-C 06/01/2020, 10:29 AM [facility] [address], [address] [place], [place] [address] [phone]

## 2020-05-31 NOTE — Patient Instructions (Addendum)
Continue current treatment plan   I will update labs today   Follow up with Dr Epimenio Foot in 6 months   Multiple Sclerosis Multiple sclerosis (MS) is a disease of the brain, spinal cord, and optic nerves (central nervous system). It causes the body's disease-fighting (immune) system to destroy the protective covering (myelin sheath) around nerves in the brain. When this happens, signals (nerve impulses) going to and from the brain and spinal cord do not get sent properly or may not get sent at all. There are several types of MS:  Relapsing-remitting MS. This is the most common type. This causes sudden attacks of symptoms. After an attack, you may recover completely until the next attack, or some symptoms may remain permanently.  Secondary progressive MS. This usually develops after the onset of relapsing-remitting MS. Similar to relapsing-remitting MS, this type also causes sudden attacks of symptoms. Attacks may be less frequent, but symptoms slowly get worse (progress) over time.  Primary progressive MS. This causes symptoms that steadily progress over time. This type of MS does not cause sudden attacks of symptoms. The age of onset of MS varies, but it often develops between 46-27 years of age. MS is a lifelong (chronic) condition. There is no cure, but treatment can help slow down the progression of the disease. What are the causes? The cause of this condition is not known. What increases the risk? You are more likely to develop this condition if:  You are a woman.  You have a relative with MS. However, the condition is not passed from parent to child (inherited).  You have a lack (deficiency) of vitamin D.  You smoke. MS is more common in the [place] than in the [place]. What are the signs or symptoms? Relapsing-remitting and secondary progressive MS cause symptoms to occur in episodes or attacks that may last weeks to months. There may be long periods  between attacks in which there are almost no symptoms. Primary progressive MS causes symptoms to steadily progress after they develop. Symptoms of MS vary because of the many different ways it affects the central nervous system. The main symptoms include:  Vision problems and eye pain.  Numbness.  Weakness.  Inability to move your arms, hands, feet, or legs (paralysis).  Balance problems.  Shaking that you cannot control (tremors).  Muscle spasms.  Problems with thinking (cognitive changes). MS can also cause symptoms that are associated with the disease, but are not always the direct result of an MS attack. They may include:  Inability to control urination or bowel movements (incontinence).  Headaches.  Fatigue.  Inability to tolerate heat.  Emotional changes.  Depression.  Pain. How is this diagnosed? This condition is diagnosed based on:  Your symptoms.  A neurological exam. This involves checking central nervous system function, such as nerve function, reflexes, and coordination.  MRIs of the brain and spinal cord.  Lab tests, including a lumbar puncture that tests the fluid that surrounds the brain and spinal cord (cerebrospinal fluid).  Tests to measure the electrical activity of the brain in response to stimulation (evoked potentials). How is this treated? There is no cure for MS, but medicines can help decrease the number and frequency of attacks and help relieve nuisance symptoms. Treatment options may include:  Medicines that reduce the frequency of attacks. These medicines may be given by injection, by mouth (orally), or through an IV.  Medicines that reduce inflammation (steroids). These may provide short-term relief of symptoms.  Medicines to help control pain, depression, fatigue, or incontinence.  Vitamin D, if you have a deficiency.  Using devices to help you move around (assistive devices), such as braces, a cane, or a walker.  Physical  therapy to strengthen and stretch your muscles.  Occupational therapy to help you with everyday tasks.  Alternative or complementary treatments such as exercise, massage, or acupuncture. Follow these instructions at home:  Take over-the-counter and prescription medicines only as told by your health care provider.  Do not drive or use heavy machinery while taking prescription pain medicine.  Use assistive devices as recommended by your physical therapist or your health care provider.  Exercise as directed by your health care provider.  Return to your normal activities as told by your health care provider. Ask your health care provider what activities are safe for you.  Reach out for support. Share your feelings with friends, family, or a support group.  Keep all follow-up visits as told by your health care provider and therapists. This is important. Where to find more information  National Multiple Sclerosis Society: https://www.nationalmssociety.org Contact a health care provider if:  You feel depressed.  You develop new pain or numbness.  You have tremors.  You have problems with sexual function. Get help right away if:  You develop paralysis.  You develop numbness.  You have problems with your bladder or bowel function.  You develop double vision.  You lose vision in one or both eyes.  You develop suicidal thoughts.  You develop severe confusion. If you ever feel like you may hurt yourself or others, or have thoughts about taking your own life, get help right away. You can go to your nearest emergency department or call:  Your local emergency services (911 in the [place]  A suicide crisis helpline, such as the [facility] at 575-690-9330. This is open 24 hours a day. Summary  Multiple sclerosis (MS) is a disease of the central nervous system that causes the body's immune system to destroy the protective covering (myelin sheath) around  nerves in the brain.  There are 3 types of MS: relapsing-remitting, secondary progressive, and primary progressive. Relapsing-remitting and secondary progressive MS cause symptoms to occur in episodes or attacks that may last weeks to months. Primary progressive MS causes symptoms to steadily progress after they develop.  There is no cure for MS, but medicines can help decrease the number and frequency of attacks and help relieve nuisance symptoms. Treatment may also include physical or occupational therapy.  If you develop numbness, paralysis, vision problems, or other neurological symptoms, get help right away. This information is not intended to replace advice given to you by your health care provider. Make sure you discuss any questions you have with your health care provider. Document Revised: 07/19/2017 Document Reviewed: 10/15/2016 Elsevier Patient Education  2020 [employer].

## 2020-06-01 ENCOUNTER — Ambulatory Visit: Payer: Federal, State, Local not specified - PPO | Admitting: Family Medicine

## 2020-06-01 ENCOUNTER — Encounter: Payer: Self-pay | Admitting: Family Medicine

## 2020-06-01 VITALS — BP 130/91 | HR 76 | Ht 63.0 in | Wt 160.0 lb

## 2020-06-01 DIAGNOSIS — R3915 Urgency of urination: Secondary | ICD-10-CM

## 2020-06-01 DIAGNOSIS — F418 Other specified anxiety disorders: Secondary | ICD-10-CM | POA: Diagnosis not present

## 2020-06-01 DIAGNOSIS — Z79899 Other long term (current) drug therapy: Secondary | ICD-10-CM

## 2020-06-01 DIAGNOSIS — F1011 Alcohol abuse, in remission: Secondary | ICD-10-CM

## 2020-06-01 DIAGNOSIS — G35 Multiple sclerosis: Secondary | ICD-10-CM | POA: Diagnosis not present

## 2020-06-01 DIAGNOSIS — G8929 Other chronic pain: Secondary | ICD-10-CM

## 2020-06-01 DIAGNOSIS — R260 Ataxic gait: Secondary | ICD-10-CM

## 2020-06-01 DIAGNOSIS — R5383 Other fatigue: Secondary | ICD-10-CM

## 2020-06-01 MED ORDER — BACLOFEN 10 MG PO TABS: 10.0000 mg | ORAL_TABLET | Freq: Three times a day (TID) | ORAL | 5 refills | Status: DC

## 2020-06-01 NOTE — Progress Notes (Signed)
I have read the note, and I agree with the clinical assessment and plan.  Iasha Mccalister A. Coline Calkin, MD, PhD, FAAN Certified in Neurology, Clinical Neurophysiology, Sleep Medicine, Pain Medicine and Neuroimaging  [facility] [address], [address] [place], [place] [address] [phone]  

## 2020-06-02 LAB — CBC WITH DIFFERENTIAL/PLATELET
Basophils Absolute: 0.1 10*3/uL (ref 0.0–0.2)
Basos: 1 %
EOS (ABSOLUTE): 0.2 10*3/uL (ref 0.0–0.4)
Eos: 3 %
Hematocrit: 34.7 % (ref 34.0–46.6)
Hemoglobin: 11.7 g/dL (ref 11.1–15.9)
Immature Grans (Abs): 0 10*3/uL (ref 0.0–0.1)
Immature Granulocytes: 0 %
Lymphocytes Absolute: 1.2 10*3/uL (ref 0.7–3.1)
Lymphs: 22 %
MCH: 33.1 pg — ABNORMAL HIGH (ref 26.6–33.0)
MCHC: 33.7 g/dL (ref 31.5–35.7)
MCV: 98 fL — ABNORMAL HIGH (ref 79–97)
Monocytes Absolute: 1.2 10*3/uL — ABNORMAL HIGH (ref 0.1–0.9)
Monocytes: 21 %
Neutrophils Absolute: 3 10*3/uL (ref 1.4–7.0)
Neutrophils: 53 %
Platelets: 285 10*3/uL (ref 150–450)
RBC: 3.54 x10E6/uL — ABNORMAL LOW (ref 3.77–5.28)
RDW: 12.8 % (ref 11.7–15.4)
WBC: 5.7 10*3/uL (ref 3.4–10.8)

## 2020-06-02 LAB — IGG, IGA, IGM
IgA/Immunoglobulin A, Serum: 280 mg/dL (ref 87–352)
IgG (Immunoglobin G), Serum: 868 mg/dL (ref 586–1602)
IgM (Immunoglobulin M), Srm: 169 mg/dL (ref 26–217)

## 2020-06-07 DIAGNOSIS — K222 Esophageal obstruction: Secondary | ICD-10-CM | POA: Diagnosis not present

## 2020-06-07 DIAGNOSIS — K208 Other esophagitis without bleeding: Secondary | ICD-10-CM | POA: Diagnosis not present

## 2020-06-07 DIAGNOSIS — R1310 Dysphagia, unspecified: Secondary | ICD-10-CM | POA: Diagnosis not present

## 2020-06-07 DIAGNOSIS — K209 Esophagitis, unspecified without bleeding: Secondary | ICD-10-CM | POA: Diagnosis not present

## 2020-06-15 ENCOUNTER — Other Ambulatory Visit: Payer: Self-pay

## 2020-07-09 ENCOUNTER — Other Ambulatory Visit: Payer: Self-pay | Admitting: Physical Medicine and Rehabilitation

## 2020-07-09 ENCOUNTER — Other Ambulatory Visit: Payer: Self-pay | Admitting: Medical

## 2020-07-11 ENCOUNTER — Other Ambulatory Visit: Payer: Self-pay | Admitting: Medical

## 2020-07-13 ENCOUNTER — Telehealth: Payer: Self-pay

## 2020-07-13 NOTE — Telephone Encounter (Signed)
Refill request sent from pharmacy for Venlafaxine HCL ER 37.5 mg. Patient last seen 02/09/2020

## 2020-07-13 NOTE — Telephone Encounter (Signed)
Patient needs appointment prior to script

## 2020-07-20 NOTE — Telephone Encounter (Signed)
Left message for patient to call back  

## 2020-07-27 ENCOUNTER — Other Ambulatory Visit: Payer: Self-pay | Admitting: Physical Medicine and Rehabilitation

## 2020-07-27 MED ORDER — FLUOXETINE HCL 40 MG PO CAPS: 40.0000 mg | ORAL_CAPSULE | Freq: Every day | ORAL | 3 refills | Status: DC

## 2020-07-27 MED ORDER — VENLAFAXINE HCL ER 37.5 MG PO CP24: 37.5000 mg | ORAL_CAPSULE | Freq: Every day | ORAL | 1 refills | Status: DC

## 2020-07-27 NOTE — Telephone Encounter (Addendum)
Patient called back and made an appt. The next available appt 08/24/2020. Will be out of Effexor and Prozac by then. Request one fill. [facility]

## 2020-07-27 NOTE — Telephone Encounter (Signed)
I have sent both! 

## 2020-07-28 DIAGNOSIS — Z20822 Contact with and (suspected) exposure to covid-19: Secondary | ICD-10-CM | POA: Diagnosis not present

## 2020-07-28 NOTE — Telephone Encounter (Signed)
Patient notified

## 2020-08-24 ENCOUNTER — Encounter: Payer: Self-pay | Admitting: Physical Medicine and Rehabilitation

## 2020-08-24 ENCOUNTER — Other Ambulatory Visit: Payer: Self-pay | Admitting: Medical

## 2020-08-24 ENCOUNTER — Other Ambulatory Visit: Payer: Self-pay

## 2020-08-24 ENCOUNTER — Encounter
Payer: Federal, State, Local not specified - PPO | Attending: Physical Medicine and Rehabilitation | Admitting: Physical Medicine and Rehabilitation

## 2020-08-24 VITALS — BP 133/81 | HR 89 | Temp 97.8°F | Ht 63.0 in | Wt 169.8 lb

## 2020-08-24 DIAGNOSIS — L299 Pruritus, unspecified: Secondary | ICD-10-CM

## 2020-08-24 DIAGNOSIS — F1010 Alcohol abuse, uncomplicated: Secondary | ICD-10-CM | POA: Diagnosis not present

## 2020-08-24 DIAGNOSIS — R4584 Anhedonia: Secondary | ICD-10-CM | POA: Diagnosis not present

## 2020-08-24 DIAGNOSIS — F329 Major depressive disorder, single episode, unspecified: Secondary | ICD-10-CM | POA: Insufficient documentation

## 2020-08-24 DIAGNOSIS — Z79899 Other long term (current) drug therapy: Secondary | ICD-10-CM | POA: Diagnosis not present

## 2020-08-24 DIAGNOSIS — M1990 Unspecified osteoarthritis, unspecified site: Secondary | ICD-10-CM | POA: Diagnosis not present

## 2020-08-24 DIAGNOSIS — M25551 Pain in right hip: Secondary | ICD-10-CM | POA: Insufficient documentation

## 2020-08-24 DIAGNOSIS — G8929 Other chronic pain: Secondary | ICD-10-CM | POA: Diagnosis not present

## 2020-08-24 DIAGNOSIS — W19XXXS Unspecified fall, sequela: Secondary | ICD-10-CM | POA: Insufficient documentation

## 2020-08-24 DIAGNOSIS — R296 Repeated falls: Secondary | ICD-10-CM | POA: Insufficient documentation

## 2020-08-24 DIAGNOSIS — G35 Multiple sclerosis: Secondary | ICD-10-CM | POA: Diagnosis not present

## 2020-08-24 DIAGNOSIS — M25552 Pain in left hip: Secondary | ICD-10-CM | POA: Insufficient documentation

## 2020-08-24 DIAGNOSIS — R450 Nervousness: Secondary | ICD-10-CM | POA: Diagnosis not present

## 2020-08-24 DIAGNOSIS — M545 Low back pain, unspecified: Secondary | ICD-10-CM | POA: Diagnosis not present

## 2020-08-24 DIAGNOSIS — M7918 Myalgia, other site: Secondary | ICD-10-CM

## 2020-08-24 DIAGNOSIS — M48062 Spinal stenosis, lumbar region with neurogenic claudication: Secondary | ICD-10-CM | POA: Insufficient documentation

## 2020-08-24 DIAGNOSIS — M5416 Radiculopathy, lumbar region: Secondary | ICD-10-CM | POA: Insufficient documentation

## 2020-08-24 MED ORDER — AMITRIPTYLINE HCL 10 MG PO TABS: 10.0000 mg | ORAL_TABLET | Freq: Every day | ORAL | 1 refills | Status: DC

## 2020-08-24 MED ORDER — MELOXICAM 7.5 MG PO TABS: 7.5000 mg | ORAL_TABLET | Freq: Every day | ORAL | 0 refills | Status: DC

## 2020-08-24 NOTE — Progress Notes (Signed)
Subjective:    Patient ID: Catherine Hubbard, female    DOB: 1961/05/01, 60 y.o.   MRN: [id number]  HPI  [name] presents for follow-up of lumbar radicular pain and cervical spine pain.  She currently has pain radiating from her neck down to her ankles.   The pain is constant, irritating, burning stabbing.  She threw things in her home last night due to her pain.   She has pain radiating into her legs, as well as swelling.   Her frustration level has been very high recently.   She would be interested in a repeat ESI and her pain is now present in the left lower extremity as well.   Prior history:  Lower back pain:  08/25/19: Her lower back pain is worst with extension and is present in her bilateral lower back. Since last visit she has been using a lumbar support brace which does provide some relief. She has been doing her HEP every day. She has had steroid injections in the past that did provide her with relief. She has pain in bilateral lower extremities as well in the anterior and lateral distributions. This pain is worse with movement-walking, standing, and when she lays flat.   10/06/19: On 1/21 she had a right sided L4-L5 epidural steroid injection with Dr. Wynn Hubbard. Her back pain is now much improved.    Bilateral hip pain: She has had some bilateral hip pain. She has been doing exercises at home for this. But she is now able to walk a lot more. She sometimes goes to the grocery store for 2 hours at a time to do her shopping as well as get exercise in walking. She still has difficulty with stairs.    Poor energy:  2/16: Her energy is now much improved and she has stopped taking the Ritalin.    Depression:  1/05: She say a psychiatrist in the past and was prescribed Prozac, Effexor, and Cymbalta which she found to be helpful for her depression. Last visit we restarted her on Prozac and this did provide some relief. She has not been following with the psychiatrist any  more and does not want to at this time.    2/16: Her mood is much improved with these medications and she is not experiencing any adverse side effects. Her friends tell her she is like a new person and she has excellent energy. 1 month ago she says she felt she could not return to work but now she feels that she may be able to.    Left CMC joint pain: She notes increased swelling in this joint compared to the opposite side and it can sometimes pain her a lot. She has a listed allergy to NSAIDs but said this was gastritis from ibuprofen more than 10 years ago. She has since taken ibuprofen without issues. Has never had any imaging of her hand or splint or steroid injection.   6/9: She was dong really well except for a month ago, when her back pain and lowe leg cramping returned. She feels soreness in both legs as well as in her back. She would be interested in repeat steroid injection. She feels like her fatigue has worsened and she is sleeping 16 hours per day. She does not have interest in anything. She is most bored in the afternoons. Her husband tries to watch TV with her in the afternoons to stimulate her. She used to like to talk to her neighbors but she has had  some issues of her own. Her roommate is a godfearing woman and she does not want to be preached to from her.    Pain Inventory Average Pain 7 Pain Right Now 10 My pain is constant, sharp, burning, stabbing, tingling and aching  In the last 24 hours, has pain interfered with the following? General activity 0 Relation with others 10 Enjoyment of life 10 What TIME of day is your pain at its worst? night Sleep (in general) Poor  Pain is worse with: unsure Pain improves with: medication Relief from Meds: no pain meds         Family History  Problem Relation Age of Onset  . Cancer Mother   . Stroke Mother   . Cancer Father    Social History   Socioeconomic History  . Marital status: Married    Spouse name: Not on  file  . Number of children: Not on file  . Years of education: Not on file  . Highest education level: Not on file  Occupational History  . Not on file  Tobacco Use  . Smoking status: Never Smoker  . Smokeless tobacco: Never Used  . Tobacco comment: second hand smoker when young. Heavy exposure.  Vaping Use  . Vaping Use: Never used  Substance and Sexual Activity  . Alcohol use: Yes    Comment: Sts. she drinks beer, liquor daily, varying amts/fim  . Drug use: No  . Sexual activity: Not Currently  Other Topics Concern  . Not on file  Social History Narrative  . Not on file   Social Determinants of Health   Financial Resource Strain: Not on file  Food Insecurity: Not on file  Transportation Needs: Not on file  Physical Activity: Not on file  Stress: Not on file  Social Connections: Not on file   Past Surgical History:  Procedure Laterality Date  . ANKLE FRACTURE SURGERY Bilateral   . CARPAL TUNNEL RELEASE Bilateral   . CHOLECYSTECTOMY    . COLONOSCOPY  11/2016   multiple  . ESOPHAGOGASTRODUODENOSCOPY  11/2016  . ESOPHAGOGASTRODUODENOSCOPY N/A 02/01/2017   Procedure: ESOPHAGOGASTRODUODENOSCOPY (EGD);  Surgeon: Iva Boop, MD;  Location: [facility] ENDOSCOPY;  Service: Endoscopy;  Laterality: N/A;  . ESOPHAGOGASTRODUODENOSCOPY (EGD) WITH PROPOFOL N/A 02/04/2017   Procedure: ESOPHAGOGASTRODUODENOSCOPY (EGD) WITH PROPOFOL;  Surgeon: Meryl Dare, MD;  Location: [facility] ENDOSCOPY;  Service: Endoscopy;  Laterality: N/A;  . LAPAROSCOPIC GASTRIC SLEEVE RESECTION  2011  . ULNAR NERVE REPAIR Bilateral    Past Medical History:  Diagnosis Date  . Alcohol abuse   . Asthma   . Esophagitis   . GERD (gastroesophageal reflux disease)   . Headache   . Hearing loss   . Hypertension   . Multiple sclerosis (HCC)   . Pancreatitis   . Vision abnormalities    LMP 08/13/2014 Comment: very few periods  Opioid Risk Score:   Fall Risk Score:  `1  Depression screen PHQ 2/9  Depression  screen [facility] 2/9 02/09/2020 06/29/2019  Decreased Interest 3 3  Down, Depressed, Hopeless 3 3  PHQ - 2 Score 6 6  Altered sleeping - 3  Tired, decreased energy - 3  Change in appetite - 3  Feeling bad or failure about yourself  - 3  Trouble concentrating - 3  Moving slowly or fidgety/restless - 3  Suicidal thoughts - 3  PHQ-9 Score - 27  Difficult doing work/chores - Extremely dIfficult  Some recent data might be hidden    Review of Systems  Constitutional: Positive for appetite change.  Gastrointestinal: Positive for constipation and diarrhea.  Musculoskeletal: Positive for arthralgias, back pain, gait problem and myalgias.       Spasms  Skin: Positive for rash.  Neurological: Positive for dizziness and tremors.       Tingling  Psychiatric/Behavioral: Positive for dysphoric mood. The patient is nervous/anxious.   All other systems reviewed and are negative.      Objective:   Physical Exam  Gen: no distress, normal appearing HEENT: oral mucosa pink and moist, NCAT Cardio: Reg rate Chest: normal effort, normal rate of breathing Abd: soft, non-distended Ext: bilateral lower extremity edema Skin: bruising left arm Neuro/Musculoskeletal: Able to walk in office without falls with RW.  Remains sitting most of this session in contrast to last session. Able to bend down and touch her toes with FROM. Diffuse tenderness along spine with gentle palpation. Extension and oblique extension reproduce pain in lumbar spine. 5/5 strength in bilateral arms. 4-/5 in bilateral legs due to diffuse weakness. No focal motor or sensory deficits. + Slump test in bilateral lower extremities.   Has increased swelling at left [facility] joint with increased tenderness to palpation compared to opposite side.  Range of motion of neck limited to 40 degrees bilateral, tingling in neck reproduced with neck flexion.  Psych: pleasant, normal affect, attention is significantly improved from last visit.         Assessment & Plan:  SHYLYNN BRUNING is a 60 y.o. female with history of alcohol abuse, multiple sclerosis and frequent falls, presenting for follow-up for her diffuse chronic pain and depression., cervical and lumbar spine pain.    1) Poor energy: The Ritalin has really helped with her attention and energy and has helped her to be more active. Her energy is tremendously improved and she no longer requires the Ritalin which she has stopped taking on her own.    2) Depression: Continue Prozac and Effexor for depression.She is experiencing worsening anhedonia and excessive sleep. Increase Prozac to 40mg .   -Has become more severe. Add Amitriptyline 10mg  HS.    Keep a daily log of goals and check them off every day.   Maintain social connections in your life. Continue exercising with your neighbors.    Walk at least 15 minutes every day, preferably outside.    Look for local organizations and programs that can add meaning and friendship to your life.    Make an effort to do activities that you enjoy, such as going to the , shopping, [employer] (cost is an issue here), consider return to work in a sedentary job.      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  -I have personally reviewed Lumbar MRI which shows mild-moderate stenosis. On 1/21 she had a right sided L4-L5 epidural steroid injection with Dr. [employer] with great improvement in symptoms.  Will refer for repeat injection.  -Discussed Meloxicam as an option to consider in the future.    4) Bilateral greater trochanteric pain syndrome: Continue HEP. Educated regarding etiology and treatment of condition.   5) Impaired mobility and ADLs secondary to Multiple Sclerosis, Major Depression Disorder, L4-L5 lumbar stenosis: Patient's overall mobility and mental health are have regressed a little since last visit. She has been trying to obtain disability for financial support and I provided her with the  address and phone number of the social security office and advised her that I am happy to help fill out any paperwork she needs. That being  said, I advised her that returning to work on a part-time basis may be very good for her, improving her quality of life, physical function, giving her purpose, and improving her financial situation. She is agreeable to this and feels that she may be able to return to work and will look for jobs that interest her and that will not be too strenuous for her (avoiding lifting heavy weights or climbing stairs). She is still nervous about falling at work.   6) Left CMC arthritis: Prescribed diclofenac gel and advised that patient may apply up to 4 times per day. If this does not provide relief asked that patient call our office and I can order an XR and provide a referral for fitting for a splint. If this does not provide relief, we can consider corticosteroid injection.   7) Alcohol abuse: She drinks at least 3 alcoholic beverages per day. She would like to quit. Discussed some of the risks of alcohol use. Made goal to decrease to 2 drinks per day. Recommended replacing third drink with Roobois tea.   8) Falls: related to alcohol use. If we can get this under control, the falls will decrease.   9) Disrupted circadian rhythm: Sometimes sleeps at 5am and sleeps during the day. Discussed sleep aides.     All questions were encouraged and answered. Follow up with me in 1 month post epidural steroid injection.   40 minutes spent in discussion of cervical and lumbar pain, repeat ESI injections, that she needs a driver, not on any anticoagulation, starting meloxicam for pain, discussing risk factors, reviewing kidney function, discussing alcohol abuse and making goal to decrease drinks to 2 per day, discussing depression, right shoulder fracture, disrupted sleep, depression

## 2020-08-24 NOTE — Patient Instructions (Addendum)
Roobois tea  Sarna lotion

## 2020-08-27 DIAGNOSIS — Z1152 Encounter for screening for COVID-19: Secondary | ICD-10-CM | POA: Diagnosis not present

## 2020-09-08 ENCOUNTER — Other Ambulatory Visit: Payer: Self-pay

## 2020-09-08 ENCOUNTER — Encounter: Payer: Self-pay | Admitting: Physical Medicine & Rehabilitation

## 2020-09-08 ENCOUNTER — Encounter: Payer: Federal, State, Local not specified - PPO | Admitting: Physical Medicine & Rehabilitation

## 2020-09-08 VITALS — BP 121/85 | HR 86 | Temp 98.3°F | Ht 63.0 in | Wt 171.0 lb

## 2020-09-08 DIAGNOSIS — M5416 Radiculopathy, lumbar region: Secondary | ICD-10-CM

## 2020-09-08 DIAGNOSIS — G35 Multiple sclerosis: Secondary | ICD-10-CM | POA: Diagnosis not present

## 2020-09-08 DIAGNOSIS — M1990 Unspecified osteoarthritis, unspecified site: Secondary | ICD-10-CM | POA: Diagnosis not present

## 2020-09-08 DIAGNOSIS — G8929 Other chronic pain: Secondary | ICD-10-CM | POA: Diagnosis not present

## 2020-09-08 DIAGNOSIS — M545 Low back pain, unspecified: Secondary | ICD-10-CM | POA: Diagnosis not present

## 2020-09-08 DIAGNOSIS — F329 Major depressive disorder, single episode, unspecified: Secondary | ICD-10-CM | POA: Diagnosis not present

## 2020-09-08 DIAGNOSIS — R4584 Anhedonia: Secondary | ICD-10-CM | POA: Diagnosis not present

## 2020-09-08 DIAGNOSIS — Z79899 Other long term (current) drug therapy: Secondary | ICD-10-CM | POA: Diagnosis not present

## 2020-09-08 DIAGNOSIS — L299 Pruritus, unspecified: Secondary | ICD-10-CM | POA: Diagnosis not present

## 2020-09-08 DIAGNOSIS — M48062 Spinal stenosis, lumbar region with neurogenic claudication: Secondary | ICD-10-CM | POA: Diagnosis not present

## 2020-09-08 DIAGNOSIS — R296 Repeated falls: Secondary | ICD-10-CM | POA: Diagnosis not present

## 2020-09-08 DIAGNOSIS — M25551 Pain in right hip: Secondary | ICD-10-CM | POA: Diagnosis not present

## 2020-09-08 DIAGNOSIS — M7918 Myalgia, other site: Secondary | ICD-10-CM | POA: Diagnosis not present

## 2020-09-08 DIAGNOSIS — R450 Nervousness: Secondary | ICD-10-CM | POA: Diagnosis not present

## 2020-09-08 DIAGNOSIS — M25552 Pain in left hip: Secondary | ICD-10-CM | POA: Diagnosis not present

## 2020-09-08 DIAGNOSIS — F1010 Alcohol abuse, uncomplicated: Secondary | ICD-10-CM | POA: Diagnosis not present

## 2020-09-08 NOTE — Progress Notes (Signed)
Bilateral L4-5 Lumbar transforaminal epidural steroid injection under fluoroscopic guidance with contrast enhancement  Indication: R>L Lumbosacral radiculitis is not relieved by medication management or other conservative care and interfering with self-care and mobility.   Informed consent was obtained after describing risk and benefits of the procedure with the patient, this includes bleeding, bruising, infection, paralysis and medication side effects.  The patient wishes to proceed and has given written consent.  Patient was placed in prone position.  The lumbar area was marked and prepped with Betadine.  It was entered with a 25-gauge 1-1/2 inch needle and one mL of 1% lidocaine was injected into the skin and subcutaneous tissue.  Then a 22-gauge 3.5 spinal needle was inserted into the LeftL4-5  intervertebral foramen under AP, lateral, and oblique view.  Once needle tip was within the foramen on lateral views nor exceeding 6 o clock position on th epedical on AP viewed Isovue 200 was inected x 53ml Then a solution containing one mL of 10 mg per mL dexamethasone and 2 mL of 1% lidocaine was injected.THe same procedure was completed on the RIght side at L4-5 using same needle and technique .  The patient tolerated procedure well.  Post procedure instructions were given.  Please see post procedure form.

## 2020-09-08 NOTE — Progress Notes (Signed)
  PROCEDURE RECORD [facility]   Name: Catherine Hubbard DOB:12-07-1960 MRN: [id number]  Date:09/08/2020  Physician: Claudette Laws, MD    Nurse/CMA: Leroy Kennedy  Allergies:  Allergies  Allergen Reactions  . 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 Nsaids Other (See Comments)    Gastric bypass    Consent Signed: Yes.    Is patient diabetic? No.  CBG today?   Pregnant: No. LMP: Patient's last menstrual period was 08/13/2014. (age 33-55)  Anticoagulants: no Anti-inflammatory: yes (Mobic) Antibiotics: no  Procedure: Bilateral L4-L5 Epidural Steroid Injection Position: Prone Start Time:1:48pm End Time:1:56pm  Fluoro Time: 41  RN/CMA Nedra Hai, CAM Nedra Hai, CMA    Time 1:30pm 2:04pm  `  BP 121/85 113/77    Pulse 86 76    Respirations 16 16    O2 Sat 93 96    S/S 6 6    Pain Level 4/10 4/10     D/C home with husband, patient A & O X 3, D/C instructions reviewed, and sits independently.

## 2020-09-08 NOTE — Patient Instructions (Signed)

## 2020-09-15 ENCOUNTER — Encounter: Payer: Self-pay | Admitting: Physical Medicine and Rehabilitation

## 2020-09-15 ENCOUNTER — Encounter: Payer: Federal, State, Local not specified - PPO | Admitting: Physical Medicine and Rehabilitation

## 2020-09-15 ENCOUNTER — Other Ambulatory Visit: Payer: Self-pay

## 2020-09-15 VITALS — BP 155/106 | HR 79 | Temp 98.9°F | Ht 65.0 in | Wt 173.0 lb

## 2020-09-15 DIAGNOSIS — M5416 Radiculopathy, lumbar region: Secondary | ICD-10-CM | POA: Diagnosis not present

## 2020-09-15 DIAGNOSIS — M48062 Spinal stenosis, lumbar region with neurogenic claudication: Secondary | ICD-10-CM | POA: Diagnosis not present

## 2020-09-15 DIAGNOSIS — R296 Repeated falls: Secondary | ICD-10-CM | POA: Diagnosis not present

## 2020-09-15 DIAGNOSIS — M545 Low back pain, unspecified: Secondary | ICD-10-CM | POA: Diagnosis not present

## 2020-09-15 DIAGNOSIS — M7918 Myalgia, other site: Secondary | ICD-10-CM | POA: Diagnosis not present

## 2020-09-15 DIAGNOSIS — M1990 Unspecified osteoarthritis, unspecified site: Secondary | ICD-10-CM | POA: Diagnosis not present

## 2020-09-15 DIAGNOSIS — F329 Major depressive disorder, single episode, unspecified: Secondary | ICD-10-CM | POA: Diagnosis not present

## 2020-09-15 DIAGNOSIS — M25552 Pain in left hip: Secondary | ICD-10-CM | POA: Diagnosis not present

## 2020-09-15 DIAGNOSIS — R450 Nervousness: Secondary | ICD-10-CM | POA: Diagnosis not present

## 2020-09-15 DIAGNOSIS — F1010 Alcohol abuse, uncomplicated: Secondary | ICD-10-CM | POA: Diagnosis not present

## 2020-09-15 DIAGNOSIS — G8929 Other chronic pain: Secondary | ICD-10-CM | POA: Diagnosis not present

## 2020-09-15 DIAGNOSIS — Z79899 Other long term (current) drug therapy: Secondary | ICD-10-CM | POA: Diagnosis not present

## 2020-09-15 DIAGNOSIS — L299 Pruritus, unspecified: Secondary | ICD-10-CM | POA: Diagnosis not present

## 2020-09-15 DIAGNOSIS — R4584 Anhedonia: Secondary | ICD-10-CM | POA: Diagnosis not present

## 2020-09-15 DIAGNOSIS — G35 Multiple sclerosis: Secondary | ICD-10-CM | POA: Diagnosis not present

## 2020-09-15 DIAGNOSIS — M25551 Pain in right hip: Secondary | ICD-10-CM | POA: Diagnosis not present

## 2020-09-15 MED ORDER — MELOXICAM 7.5 MG PO TABS: 7.5000 mg | ORAL_TABLET | Freq: Every day | ORAL | 1 refills | Status: DC

## 2020-09-15 NOTE — Patient Instructions (Signed)
Foods that may reduce pain: 1) Ginger 2) Blueberries 3) Salmon 4) Pumpkin seeds 5) dark chocolate 6) turmeric 7) tart cherries 8) virgin olive oil 9) chilli peppers 10) mint  Link to further information on diet for chronic pain: http://www.bray.com/    Turmeric to reduce inflammation-can be used in cooking or taken as a supplement.  Benefits of turmeric:  -Highly anti-inflammatory  -Increases antioxidants  -Improves memory, attention, brain disease  -Lowers risk of heart disease  -May help prevent cancer  -Decreases pain  -Alleviates depression  -Delays aging and decreases risk of chronic disease  -Consume with black pepper to increase absorption    Turmeric Milk Recipe:  1 cup milk  1 tsp turmeric  1 tsp cinnamon  1 tsp grated ginger (optional)  Black pepper (boosts the anti-inflammatory properties of turmeric).  1 tsp honey

## 2020-09-27 ENCOUNTER — Telehealth: Payer: Self-pay | Admitting: Family Medicine

## 2020-09-27 NOTE — Telephone Encounter (Signed)
Pt is getting Ocrevus through our [employer]. When this PA form comes it should go to them.

## 2020-09-27 NOTE — Telephone Encounter (Signed)
Catherine Hubbard @ [facility] has called asking the RN be aware that he is sending over a PA request and a Nursing Prescription form re: Ocrevus for pt.  If tehre are questions their call back # is [phone] xt [phone]

## 2020-10-03 ENCOUNTER — Other Ambulatory Visit: Payer: Self-pay | Admitting: Physical Medicine and Rehabilitation

## 2020-10-17 DIAGNOSIS — G35 Multiple sclerosis: Secondary | ICD-10-CM | POA: Diagnosis not present

## 2020-10-18 ENCOUNTER — Encounter: Payer: Self-pay | Admitting: Physical Medicine and Rehabilitation

## 2020-10-18 ENCOUNTER — Encounter
Payer: Federal, State, Local not specified - PPO | Attending: Physical Medicine and Rehabilitation | Admitting: Physical Medicine and Rehabilitation

## 2020-10-18 ENCOUNTER — Other Ambulatory Visit: Payer: Self-pay

## 2020-10-18 VITALS — BP 148/87 | HR 71 | Temp 98.1°F | Ht 65.0 in | Wt 176.2 lb

## 2020-10-18 DIAGNOSIS — M7918 Myalgia, other site: Secondary | ICD-10-CM | POA: Diagnosis not present

## 2020-10-18 DIAGNOSIS — M4802 Spinal stenosis, cervical region: Secondary | ICD-10-CM | POA: Diagnosis not present

## 2020-10-18 DIAGNOSIS — R2689 Other abnormalities of gait and mobility: Secondary | ICD-10-CM | POA: Insufficient documentation

## 2020-10-18 DIAGNOSIS — F1011 Alcohol abuse, in remission: Secondary | ICD-10-CM | POA: Diagnosis not present

## 2020-10-18 NOTE — Patient Instructions (Addendum)
Foods that help with pain:  1) Ginger 2) Blueberries 3) Salmon 4) Pumpkin seeds 5) dark chocolate 6) turmeric 7) tart cherries 8) virgin olive oil 9) chilli peppers 10) mint 11) garlic  Link to further information on diet for chronic pain: http://www.bray.com/

## 2020-10-18 NOTE — Progress Notes (Signed)
Subjective:    Patient ID: Catherine Hubbard, female    DOB: 08-12-1961, 60 y.o.   MRN: [id number]  HPI  [name] presents for follow-up of lumbar radicular pain and cervical spine pain.  1) Cervical myofascial pain syndrome: -the trigger point injections helped last time. -She continues to have tension and would like to repeat these next time.    2) Impaired mobility and ADLs -She has difficulty lifting weights and this makes it hard for her to find a job.   3) Cervical spinal stenosis: She currently has pain radiating from her neck down to her ankles.  -The pain is constant, irritating, burning stabbing. -The Meloxicam is helping. She does not need a refill.   4) Alcohol abuse: -Has led to falls in the past.    Prior history: She threw things in her home last night due to her pain.   She has pain radiating into her legs, as well as swelling.   Her frustration level has been very high recently.   She would be interested in a repeat ESI and her pain is now present in the left lower extremity as well.   Prior history:  Lower back pain:  08/25/19: Her lower back pain is worst with extension and is present in her bilateral lower back. Since last visit she has been using a lumbar support brace which does provide some relief. She has been doing her HEP every day. She has had steroid injections in the past that did provide her with relief. She has pain in bilateral lower extremities as well in the anterior and lateral distributions. This pain is worse with movement-walking, standing, and when she lays flat.   10/06/19: On 1/21 she had a right sided L4-L5 epidural steroid injection with Dr. Wynn Banker. Her back pain is now much improved.    Bilateral hip pain: She has had some bilateral hip pain. She has been doing exercises at home for this. But she is now able to walk a lot more. She sometimes goes to the grocery store for 2 hours at a time to do her shopping as well as get  exercise in walking. She still has difficulty with stairs.    Poor energy:  2/16: Her energy is now much improved and she has stopped taking the Ritalin.    Depression:  1/05: She say a psychiatrist in the past and was prescribed Prozac, Effexor, and Cymbalta which she found to be helpful for her depression. Last visit we restarted her on Prozac and this did provide some relief. She has not been following with the psychiatrist any more and does not want to at this time.    2/16: Her mood is much improved with these medications and she is not experiencing any adverse side effects. Her friends tell her she is like a new person and she has excellent energy. 1 month ago she says she felt she could not return to work but now she feels that she may be able to.    Left CMC joint pain: She notes increased swelling in this joint compared to the opposite side and it can sometimes pain her a lot. She has a listed allergy to NSAIDs but said this was gastritis from ibuprofen more than 10 years ago. She has since taken ibuprofen without issues. Has never had any imaging of her hand or splint or steroid injection.   6/9: She was dong really well except for a month ago, when her back pain and  lowe leg cramping returned. She feels soreness in both legs as well as in her back. She would be interested in repeat steroid injection. She feels like her fatigue has worsened and she is sleeping 16 hours per day. She does not have interest in anything. She is most bored in the afternoons. Her husband tries to watch TV with her in the afternoons to stimulate her. She used to like to talk to her neighbors but she has had some issues of her own. Her roommate is a godfearing woman and she does not want to be preached to from her.    Pain Inventory Average Pain 5 Pain Right Now 3 My pain is sharp, burning, dull, stabbing, tingling and aching  In the last 24 hours, has pain interfered with the following? General activity  4 Relation with others 4 Enjoyment of life 4 What TIME of day is your pain at its worst? daytime Sleep (in general) Fair  Pain is worse with: walking, bending, inactivity and unsure Pain improves with: pacing activities, medication, TENS and injections Relief from Meds: 8         Family History  Problem Relation Age of Onset  . Cancer Mother   . Stroke Mother   . Cancer Father    Social History   Socioeconomic History  . Marital status: Married    Spouse name: Not on file  . Number of children: Not on file  . Years of education: Not on file  . Highest education level: Not on file  Occupational History  . Not on file  Tobacco Use  . Smoking status: Never Smoker  . Smokeless tobacco: Never Used  . Tobacco comment: second hand smoker when young. Heavy exposure.  Vaping Use  . Vaping Use: Never used  Substance and Sexual Activity  . Alcohol use: Yes    Comment: Sts. she drinks beer, liquor daily, varying amts/fim  . Drug use: No  . Sexual activity: Not Currently  Other Topics Concern  . Not on file  Social History Narrative  . Not on file   Social Determinants of Health   Financial Resource Strain: Not on file  Food Insecurity: Not on file  Transportation Needs: Not on file  Physical Activity: Not on file  Stress: Not on file  Social Connections: Not on file   Past Surgical History:  Procedure Laterality Date  . ANKLE FRACTURE SURGERY Bilateral   . CARPAL TUNNEL RELEASE Bilateral   . CHOLECYSTECTOMY    . COLONOSCOPY  11/2016   multiple  . ESOPHAGOGASTRODUODENOSCOPY  11/2016  . ESOPHAGOGASTRODUODENOSCOPY N/A 02/01/2017   Procedure: ESOPHAGOGASTRODUODENOSCOPY (EGD);  Surgeon: Iva Boop, MD;  Location[facility] ENDOSCOPY;  Service: Endoscopy;  Laterality: N/A;  . ESOPHAGOGASTRODUODENOSCOPY (EGD) WITH PROPOFOL N/A 02/04/2017   Procedure: ESOPHAGOGASTRODUODENOSCOPY (EGD) WITH PROPOFOL;  Surgeon: Meryl Dare, MD;  Location: [facility] ENDOSCOPY;  Service:  Endoscopy;  Laterality: N/A;  . LAPAROSCOPIC GASTRIC SLEEVE RESECTION  2011  . ULNAR NERVE REPAIR Bilateral    Past Medical History:  Diagnosis Date  . Alcohol abuse   . Asthma   . Esophagitis   . GERD (gastroesophageal reflux disease)   . Headache   . Hearing loss   . Hypertension   . Multiple sclerosis (HCC)   . Pancreatitis   . Vision abnormalities    BP (!) 148/87   Pulse 71   Temp 98.1 F (36.7 C)   Ht 5\' 5"  (1.651 m)   Wt 176 lb 3.2 oz (79.9  kg)   LMP 08/13/2014 Comment: very few periods  SpO2 95%   BMI 29.32 kg/m   Opioid Risk Score:   Fall Risk Score:  `1  Depression screen PHQ 2/9  Depression screen [facility] 2/9 08/24/2020 02/09/2020 06/29/2019  Decreased Interest 1 3 3   Down, Depressed, Hopeless 1 3 3   PHQ - 2 Score 2 6 6   Altered sleeping - - 3  Tired, decreased energy - - 3  Change in appetite - - 3  Feeling bad or failure about yourself  - - 3  Trouble concentrating - - 3  Moving slowly or fidgety/restless - - 3  Suicidal thoughts - - 3  PHQ-9 Score - - 27  Difficult doing work/chores - - Extremely dIfficult  Some recent data might be hidden    Review of Systems  Constitutional: Positive for appetite change.  Gastrointestinal: Positive for constipation and diarrhea.  Musculoskeletal: Positive for arthralgias, back pain, gait problem and myalgias.       Spasms  Skin: Positive for rash.  Neurological: Positive for dizziness and tremors.       Tingling  Psychiatric/Behavioral: Positive for dysphoric mood. The patient is nervous/anxious.   All other systems reviewed and are negative.      Objective:   Physical Exam Gen: no distress, normal appearing HEENT: oral mucosa pink and moist, NCAT Cardio: Reg rate Chest: normal effort, normal rate of breathing Abd: soft, non-distended Ext: bilateral lower extremity edema Skin: bruising left arm Neuro/Musculoskeletal: Able to walk in office without falls with RW.  Remains sitting most of this session in  contrast to last session. Able to bend down and touch her toes with FROM. Diffuse tenderness along spine with gentle palpation. Extension and oblique extension reproduce pain in lumbar spine. 5/5 strength in bilateral arms. 4-/5 in bilateral legs due to diffuse weakness. No focal motor or sensory deficits. + Slump test in bilateral lower extremities.   Has increased swelling at left [facility] joint with increased tenderness to palpation compared to opposite side.  Range of motion of neck limited to 40 degrees bilateral, tingling in neck reproduced with neck flexion.  Psych: pleasant, normal affect, attention is significantly improved from last visit.        Assessment & Plan:  Catherine Hubbard is a 60 y.o. female with history of alcohol abuse, multiple sclerosis and frequent falls, presenting for follow-up for her diffuse chronic pain and depression., cervical and lumbar spine pain.    1) Poor energy: The Ritalin has really helped with her attention and energy and has helped her to be more active. Her energy is tremendously improved and she no longer requires the Ritalin which she has stopped taking on her own.    2) Depression: Continue Prozac and Effexor for depression.She is experiencing worsening anhedonia and excessive sleep. Increase Prozac to 40mg .   -Has become more severe. Add Amitriptyline 10mg  HS.    Keep a daily log of goals and check them off every day.   Maintain social connections in your life. Continue exercising with your neighbors.    Walk at least 15 minutes every day, preferably outside.    Look for local organizations and programs that can add meaning and friendship to your life.    Make an effort to do activities that you enjoy, such as going to the [facility], shopping, [employer] (cost is an issue here), consider return to work in a sedentary job.      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  -I have personally reviewed Lumbar MRI which  shows mild-moderate stenosis. On 1/21 she had a right sided L4-L5 epidural steroid injection with Dr. Wynn Banker with great improvement in symptoms.  Will refer for repeat injection.  -Discussed Meloxicam as an option to consider in the future.    4) Bilateral greater trochanteric pain syndrome: Continue HEP. Educated regarding etiology and treatment of condition.   5) Impaired mobility and ADLs secondary to Multiple Sclerosis, Major Depression Disorder, L4-L5 lumbar stenosis: Patient's overall mobility and mental health are have regressed a little since last visit. She has been trying to obtain disability for financial support and I provided her with the address and phone number of the social security office and advised her that I am happy to help fill out any paperwork she needs. That being said, I advised her that returning to work on a part-time basis may be very good for her, improving her quality of life, physical function, giving her purpose, and improving her financial situation. She is agreeable to this and feels that she may be able to return to work and will look for jobs that interest her and that will not be too strenuous for her (avoiding lifting heavy weights or climbing stairs). She is still nervous about falling at work. -Encouraged to continue looking for work.    6) Left CMC arthritis: Prescribed diclofenac gel and advised that patient may apply up to 4 times per day. If this does not provide relief asked that patient call our office and I can order an XR and provide a referral for fitting for a splint. If this does not provide relief, we can consider corticosteroid injection.   7) Alcohol abuse: She drinks at least 3 alcoholic beverages per day. She would like to quit. Discussed some of the risks of alcohol use. Made goal to decrease to 2 drinks per day. Recommended replacing third drink with Roobois tea.   8) Falls: related to alcohol use. If we can get this under control, the falls  will decrease.   9) Disrupted circadian rhythm: Sometimes sleeps at 5am and sleeps during the day. Discussed sleep aides.   10) Cervical myofascial pain syndrome: -Repeat trigger point injections next visit.  11) Cervical spinal stenosis: -Continue Meloxicam, taking as needed.  -Keep up the turmeric.  -Discussed current symptoms of pain and history of pain.  -Discussed benefits of exercise in reducing pain. -Discussed following foods that may reduce pain: 1) Ginger 2) Blueberries 3) Salmon 4) Pumpkin seeds 5) dark chocolate 6) turmeric 7) tart cherries 8) virgin olive oil 9) chilli Hubbard 10) mint 11) red wine 12) garlic  Link to further information on diet for chronic pain: [email or web address]     All questions were encouraged and answered. Follow up with me in mid-April.

## 2020-11-02 ENCOUNTER — Other Ambulatory Visit: Payer: Self-pay | Admitting: Physical Medicine and Rehabilitation

## 2020-11-02 ENCOUNTER — Other Ambulatory Visit: Payer: Self-pay | Admitting: Neurology

## 2020-11-03 ENCOUNTER — Telehealth: Payer: Self-pay | Admitting: *Deleted

## 2020-11-03 NOTE — Telephone Encounter (Signed)
Submitted PA modafinil on CMM. Key: [id number]. Received instant approval effecitve 10/04/2020 through 11/03/2021.

## 2020-11-08 DIAGNOSIS — H903 Sensorineural hearing loss, bilateral: Secondary | ICD-10-CM | POA: Diagnosis not present

## 2020-11-30 ENCOUNTER — Emergency Department (HOSPITAL_BASED_OUTPATIENT_CLINIC_OR_DEPARTMENT_OTHER): Payer: Federal, State, Local not specified - PPO

## 2020-11-30 ENCOUNTER — Ambulatory Visit: Payer: Federal, State, Local not specified - PPO | Admitting: Neurology

## 2020-11-30 ENCOUNTER — Encounter (HOSPITAL_BASED_OUTPATIENT_CLINIC_OR_DEPARTMENT_OTHER): Payer: Self-pay | Admitting: *Deleted

## 2020-11-30 ENCOUNTER — Other Ambulatory Visit: Payer: Self-pay

## 2020-11-30 ENCOUNTER — Emergency Department (HOSPITAL_BASED_OUTPATIENT_CLINIC_OR_DEPARTMENT_OTHER)
Admission: EM | Admit: 2020-11-30 | Discharge: 2020-11-30 | Disposition: A | Discharge status: Home / Self Care | Payer: Federal, State, Local not specified - PPO | Attending: Emergency Medicine | Admitting: Emergency Medicine

## 2020-11-30 ENCOUNTER — Encounter: Payer: Self-pay | Admitting: Neurology

## 2020-11-30 DIAGNOSIS — M546 Pain in thoracic spine: Secondary | ICD-10-CM | POA: Diagnosis not present

## 2020-11-30 DIAGNOSIS — Z79899 Other long term (current) drug therapy: Secondary | ICD-10-CM | POA: Insufficient documentation

## 2020-11-30 DIAGNOSIS — M545 Low back pain, unspecified: Secondary | ICD-10-CM | POA: Diagnosis not present

## 2020-11-30 DIAGNOSIS — J45909 Unspecified asthma, uncomplicated: Secondary | ICD-10-CM | POA: Insufficient documentation

## 2020-11-30 DIAGNOSIS — W06XXXA Fall from bed, initial encounter: Secondary | ICD-10-CM | POA: Diagnosis not present

## 2020-11-30 DIAGNOSIS — I10 Essential (primary) hypertension: Secondary | ICD-10-CM | POA: Diagnosis not present

## 2020-11-30 DIAGNOSIS — S22060A Wedge compression fracture of T7-T8 vertebra, initial encounter for closed fracture: Secondary | ICD-10-CM | POA: Diagnosis not present

## 2020-11-30 DIAGNOSIS — Z7722 Contact with and (suspected) exposure to environmental tobacco smoke (acute) (chronic): Secondary | ICD-10-CM | POA: Insufficient documentation

## 2020-11-30 DIAGNOSIS — S22068A Other fracture of T7-T8 thoracic vertebra, initial encounter for closed fracture: Secondary | ICD-10-CM | POA: Diagnosis not present

## 2020-11-30 DIAGNOSIS — Z7951 Long term (current) use of inhaled steroids: Secondary | ICD-10-CM | POA: Insufficient documentation

## 2020-11-30 DIAGNOSIS — S299XXA Unspecified injury of thorax, initial encounter: Secondary | ICD-10-CM | POA: Diagnosis not present

## 2020-11-30 MED ORDER — OXYCODONE HCL 5 MG PO TABS
2.5000 mg | ORAL_TABLET | Freq: Once | ORAL | Status: AC
  Administered 2020-11-30: 2.5 mg via ORAL
  Filled 2020-11-30: qty 1

## 2020-11-30 MED ORDER — ACETAMINOPHEN 500 MG PO TABS
1000.0000 mg | ORAL_TABLET | Freq: Once | ORAL | Status: AC
  Administered 2020-11-30: 1000 mg via ORAL
  Filled 2020-11-30: qty 2

## 2020-11-30 MED ORDER — OXYCODONE HCL 5 MG PO TABS: 2.5000 mg | ORAL_TABLET | Freq: Four times a day (QID) | ORAL | 0 refills | Status: AC | PRN

## 2020-11-30 NOTE — ED Provider Notes (Signed)
[facility] EMERGENCY DEPARTMENT Provider Note  CSN: [id number] Arrival date & time: 11/30/20 0043  Chief Complaint(s) Fall  HPI Catherine Hubbard is a 60 y.o. female here with mid back pain for 4 days that began after she rolled out of bed and landed on the floor.  Pain has been constant since onset and persistent.  Moderate to severe nature.  Exacerbated with movement and palpation of the mid back.  Patient reports a history of urinary incontinence without any change.  She denies any bowel incontinence.  No lower extremity weakness or loss of sensation.  Patient has been trying home medications with minimal relief.  Denies any other physical complaints or injuries.  HPI  Past Medical History Past Medical History:  Diagnosis Date  . Alcohol abuse   . Asthma   . Esophagitis   . GERD (gastroesophageal reflux disease)   . Headache   . Hearing loss   . Hypertension   . Multiple sclerosis (HCC)   . Pancreatitis   . Vision abnormalities    Patient Active Problem List   Diagnosis Date Noted  . History of alcohol abuse 12/02/2019  . Multiple rib fractures 04/14/2019  . Multiple fractures of ribs, bilateral, initial encounter for closed fracture 04/14/2019  . Closed 3-part fracture of proximal humerus with routine healing 04/01/2019  . Multiple sclerosis exacerbation (HCC) 11/16/2018  . Exacerbation of multiple sclerosis (HCC) 11/16/2018  . Pneumonia 09/26/2018  . Elevated troponin 09/26/2018  . Acute hypokalemia 09/25/2018  . Arthritis of carpometacarpal joint 03/27/2018  . Nocturnal leg cramps 03/26/2018  . Syncope due to orthostatic hypotension 02/12/2018  . Diarrhea 02/08/2018  . Acute lower UTI 02/08/2018  . Hematoma of scalp 02/08/2018  . Neck pain 01/20/2018  . Foreign body in esophagus   . Esophageal stricture   . Intractable nausea and vomiting 01/31/2017  . Dysphagia   . Severe protein-calorie malnutrition (HCC)   . Nausea & vomiting 01/30/2017  .  Hypokalemia 01/30/2017  . Acute kidney injury (HCC) 01/30/2017  . Cystitis 01/30/2017  . Costochondritis 05/11/2016  . Trochanteric bursitis of both hips 03/28/2016  . Upper back pain 03/28/2016  . Bilateral low back pain with bilateral sciatica 07/13/2015  . Right hip pain 07/13/2015  . Lumbar radicular pain 03/21/2015  . Other fatigue 01/11/2015  . Dysesthesia 01/11/2015  . Urinary urgency 01/11/2015  . Multiple sclerosis (HCC) 10/06/2014  . Numbness 10/06/2014  . Ataxic gait 10/06/2014  . High risk medication use 10/06/2014  . Gastric bypass status for obesity 10/06/2014  . Cognitive changes 10/06/2014  . Depression with anxiety 10/06/2014  . Restless leg 10/06/2014  . Insomnia 10/06/2014  . Essential hypertension 10/06/2014  . Difficulty hearing 01/13/2014  . Other muscle spasm 01/13/2014   Home Medication(s) Prior to Admission medications   Medication Sig Start Date End Date Taking? Authorizing Provider  oxyCODONE (ROXICODONE) 5 MG immediate release tablet Take 0.5-1 tablets (2.5-5 mg total) by mouth every 6 (six) hours as needed for up to 5 days for severe pain. 11/30/20 12/05/20 Yes Pamala Hayman, Amadeo Garnet, MD  albuterol (PROVENTIL) (2.5 MG/3ML) 0.083% nebulizer solution Take 3 mLs (2.5 mg total) by nebulization every 6 (six) hours as needed for wheezing or shortness of breath. 11/26/19   Saguier, Ramon Dredge, PA-C  albuterol (VENTOLIN HFA) 108 (90 Base) MCG/ACT inhaler TAKE 2 PUFFS BY MOUTH EVERY 6 HOURS AS NEEDED FOR WHEEZE OR SHORTNESS OF BREATH 02/09/20   Saguier, Ramon Dredge, PA-C  amitriptyline (ELAVIL) 10 MG tablet TAKE 1  TABLET BY MOUTH EVERYDAY AT BEDTIME 11/02/20   Raulkar, Drema Pry, MD  baclofen (LIORESAL) 10 MG tablet Take 1 tablet (10 mg total) by mouth 3 (three) times daily. 06/01/20   Lomax, Amy, NP  budesonide-formoterol (SYMBICORT) 80-4.5 MCG/ACT inhaler TAKE 2 PUFFS BY MOUTH TWICE A DAY 07/11/20   Saguier, Ramon Dredge, PA-C  Cholecalciferol (D2000 ULTRA STRENGTH) 50 MCG (2000  UT) CAPS Take 1 capsule by mouth daily.     [provider]  diclofenac Sodium (VOLTAREN) 1 % GEL APPLY 4 G TOPICALLY 4 (FOUR) TIMES DAILY. Patient taking differently: Apply 4 g topically 4 (four) times daily. PRN 12/24/19   Raulkar, Drema Pry, MD  FLUoxetine (PROZAC) 40 MG capsule Take 1 capsule (40 mg total) by mouth daily. 07/27/20   Raulkar, Drema Pry, MD  fluticasone (FLONASE) 50 MCG/ACT nasal spray Place 2 sprays into both nostrils daily. 08/25/20   Saguier, Ramon Dredge, PA-C  meloxicam (MOBIC) 7.5 MG tablet TAKE 1 TABLET BY MOUTH EVERY DAY 11/02/20   Raulkar, Drema Pry, MD  modafinil (PROVIGIL) 200 MG tablet TAKE 1 TABLET BY MOUTH EVERY DAY 11/02/20   Sater, Pearletha Furl, MD  Multiple Vitamin (MULTIVITAMIN WITH MINERALS) TABS tablet Take 1 tablet by mouth daily.    [provider]  OCREVUS 300 MG/10ML injection Inject 20 mLs (600 mg total) into the vein every 6 (six) months. 10/05/19   Lomax, Amy, NP  olmesartan (BENICAR) 5 MG tablet TAKE 2 TABLETS BY MOUTH EVERY DAY 07/11/20   Saguier, Ramon Dredge, PA-C  omeprazole (PRILOSEC) 20 MG capsule Take by mouth. 07/09/20   [provider]  ondansetron (ZOFRAN-ODT) 8 MG disintegrating tablet TAKE 1 TABLET (8 MG TOTAL) BY MOUTH EVERY 8 (EIGHT) HOURS AS NEEDED FOR NAUSEA OR VOMITING. 09/08/19   Saguier, Ramon Dredge, PA-C  oxybutynin (DITROPAN) 5 MG tablet Take 1 tablet (5 mg total) by mouth 2 (two) times daily. 12/02/19   Sater, Pearletha Furl, MD  venlafaxine XR (EFFEXOR-XR) 37.5 MG 24 hr capsule TAKE 1 CAPSULE BY MOUTH DAILY WITH BREAKFAST. 10/04/20   Raulkar, Drema Pry, MD                                                                                                                                    Past Surgical History Past Surgical History:  Procedure Laterality Date  . ANKLE FRACTURE SURGERY Bilateral   . CARPAL TUNNEL RELEASE Bilateral   . CHOLECYSTECTOMY    . COLONOSCOPY  11/2016   multiple  . ESOPHAGOGASTRODUODENOSCOPY  11/2016  .  ESOPHAGOGASTRODUODENOSCOPY N/A 02/01/2017   Procedure: ESOPHAGOGASTRODUODENOSCOPY (EGD);  Surgeon: Iva Boop, MD;  Location: [facility] ENDOSCOPY;  Service: Endoscopy;  Laterality: N/A;  . ESOPHAGOGASTRODUODENOSCOPY (EGD) WITH PROPOFOL N/A 02/04/2017   Procedure: ESOPHAGOGASTRODUODENOSCOPY (EGD) WITH PROPOFOL;  Surgeon: Meryl Dare, MD;  Location: [facility] ENDOSCOPY;  Service: Endoscopy;  Laterality: N/A;  . LAPAROSCOPIC GASTRIC SLEEVE RESECTION  2011  . ULNAR NERVE REPAIR Bilateral  Family History Family History  Problem Relation Age of Onset  . Cancer Mother   . Stroke Mother   . Cancer Father     Social History Social History   Tobacco Use  . Smoking status: Never Smoker  . Smokeless tobacco: Never Used  . Tobacco comment: second hand smoker when young. Heavy exposure.  Vaping Use  . Vaping Use: Never used  Substance Use Topics  . Alcohol use: Yes    Comment: Sts. she drinks beer, liquor daily, varying amts/fim  . Drug use: No   Allergies Ibuprofen and Nsaids  Review of Systems Review of Systems All other systems are reviewed and are negative for acute change except as noted in the HPI  Physical Exam Vital Signs  I have reviewed the triage vital signs BP 118/86 (BP Location: Left Arm)   Pulse 72   Temp 98.8 F (37.1 C) (Oral)   Resp 20   Ht 5\' 5"  (1.651 m)   Wt 77.1 kg   LMP 08/13/2014 Comment: very few periods  SpO2 98%   BMI 28.29 kg/m   Physical Exam Vitals reviewed.  Constitutional:      General: She is not in acute distress.    Appearance: She is well-developed. She is not diaphoretic.  HENT:     Head: Normocephalic and atraumatic.     Right Ear: External ear normal.     Left Ear: External ear normal.     Nose: Nose normal.  Eyes:     General: No scleral icterus.    Conjunctiva/sclera: Conjunctivae normal.  Neck:     Trachea: Phonation normal.  Cardiovascular:     Rate and Rhythm: Normal rate and regular rhythm.  Pulmonary:     Effort:  Pulmonary effort is normal. No respiratory distress.     Breath sounds: No stridor.  Abdominal:     General: There is no distension.  Musculoskeletal:        General: Normal range of motion.     Cervical back: Normal range of motion.     Thoracic back: Spasms, tenderness and bony tenderness present.       Back:  Neurological:     Mental Status: She is alert and oriented to person, place, and time.     Comments: Spine Exam: Strength: 5/5 throughout LE bilaterally Sensation: Intact to light touch in proximal and distal LE bilaterally Reflexes: 1+ quadriceps and achilles reflexes   Psychiatric:        Behavior: Behavior normal.     ED Results and Treatments Labs (all labs ordered are listed, but only abnormal results are displayed) Labs Reviewed - No data to display                                                                                                                       EKG  EKG Interpretation  Date/Time:    Ventricular Rate:    PR Interval:    QRS Duration:   QT Interval:  QTC Calculation:   R Axis:     Text Interpretation:        Radiology DG Thoracic Spine 2 View  Result Date: 11/30/2020 CLINICAL DATA:  Mid back pain, fall EXAM: THORACIC SPINE 2 VIEWS COMPARISON:  MRI 11/16/2018 FINDINGS: Normal thoracic kyphosis. There is an age indeterminate anterior wedge compression fracture of T8 with approximatelyl 30-40% loss of height. Remaining vertebral body height has been preserved. No listhesis. Mild endplate changes are noted at T11-12 in keeping with changes of mild degenerative disc disease. Remaining intervertebral disc heights are preserved. The paraspinal soft tissues are unremarkable. Chronic compression deformity of L1 is again noted. IMPRESSION: Age indeterminate T8 compression fracture with 30-40% loss of height. Correlation for point tenderness may be helpful. If desired, this could be better aged with MRI examination. In Electronically Signed   By:  Helyn NumbersAshesh  Parikh MD   On: 11/30/2020 02:41   DG Lumbar Spine 2-3 Views  Result Date: 11/30/2020 CLINICAL DATA:  Mid back pain EXAM: LUMBAR SPINE - 2-3 VIEW COMPARISON:  CT 11/24/2017 FINDINGS: Normal lumbar lordosis. No listhesis. Anterior wedge compression fracture of L1, asymmetrically more severe involving the left vertebral body is again seen and appears stable since prior examination. Remaining vertebral body height has been preserved. There is intervertebral disc space narrowing and endplate remodeling of T12-L4 in keeping with changes of mild to moderate degenerative disc disease, slightly progressive since prior examination. The paraspinal soft tissues are unremarkable. IMPRESSION: Stable remote compression deformity of L1. No acute fracture or listhesis of the lumbar spine. Electronically Signed   By: Helyn NumbersAshesh  Parikh MD   On: 11/30/2020 02:44    Pertinent labs & imaging results that were available during my care of the patient were reviewed by me and considered in my medical decision making (see chart for details).  Medications Ordered in ED Medications  acetaminophen (TYLENOL) tablet 1,000 mg (has no administration in time range)  acetaminophen (TYLENOL) tablet 1,000 mg (1,000 mg Oral Given 11/30/20 0223)  oxyCODONE (Oxy IR/ROXICODONE) immediate release tablet 2.5 mg (2.5 mg Oral Given 11/30/20 [id number])                                                                                                                                    Procedures Procedures  (including critical care time)  Medical Decision Making / ED Course I have reviewed the nursing notes for this encounter and the patient's prior records (if available in EHR or on provided paperwork).   Catherine Hubbard was evaluated in Emergency Department on 11/30/2020 for the symptoms described in the history of present illness. She was evaluated in the context of the global COVID-19 pandemic, which necessitated consideration that  the patient might be at risk for infection with the SARS-CoV-2 virus that causes COVID-19. Institutional protocols and algorithms that pertain to the evaluation of patients at risk for COVID-19 are in a state of rapid change  based on information released by regulatory bodies including the CDC and federal and state organizations. These policies and algorithms were followed during the patient's care in the ED.   Clinical Course as of 11/30/20 0528  Wed Nov 30, 2020  2831 Work-up notable for T8 compression fracture. No neuro deficits in lower extremities. Patient provided with pain meds. TLSO ordered. Recommend follow-up with neurosurgery. [PC]    Clinical Course User Index [PC] Madelena Maturin, Amadeo Garnet, MD     Final Clinical Impression(s) / ED Diagnoses Final diagnoses:  Compression fracture of T8 vertebra, initial encounter [facility])   The patient appears reasonably screened and/or stabilized for discharge and I doubt any other medical condition or other [facility] requiring further screening, evaluation, or treatment in the ED at this time prior to discharge. Safe for discharge with strict return precautions.  Disposition: Discharge  Condition: Good  I have discussed the results, Dx and Tx plan with the patient/family who expressed understanding and agree(s) with the plan. Discharge instructions discussed at length. The patient/family was given strict return precautions who verbalized understanding of the instructions. No further questions at time of discharge.    ED Discharge Orders         Ordered    oxyCODONE (ROXICODONE) 5 MG immediate release tablet  Every 6 hours PRN        11/30/20 0527          [facility] narcotic database reviewed and no active prescriptions noted.   Follow Up: Marisue Brooklyn [address] [place] [address] [phone]  Call  to schedule an appointment for close follow up for additional pain control  Lisbeth Renshaw,  MD [address] [place] [address] [phone]  Call  to schedule an appointment for close follow up      This chart was dictated using voice recognition software.  Despite best efforts to proofread,  errors can occur which can change the documentation meaning.   Nira Conn, MD 11/30/20 [phone]

## 2020-11-30 NOTE — Discharge Instructions (Addendum)
For pain control you may take at 1000 mg of Tylenol every 8 hours scheduled.  In addition you can take 0.5 to 1 tablet of Oxyodone every 6 hours for pain not controlled with the scheduled Tylenol.

## 2020-11-30 NOTE — Progress Notes (Deleted)
Ordered TLSO for pt.

## 2020-11-30 NOTE — ED Triage Notes (Signed)
C/o fall x 4 days ago c/o back pain , ETOh today

## 2020-11-30 NOTE — ED Notes (Signed)
Patient transported to X-ray 

## 2020-11-30 NOTE — ED Notes (Signed)
ED Provider at bedside. 

## 2020-11-30 NOTE — Progress Notes (Signed)
Orthopedic Tech Progress Note Patient Details:  Catherine Hubbard 05-Jul-1961 [id number] Ordered TLSO brace Patient ID: [name], female   DOB: 1961/05/02, 61 y.o.   MRN: [id number]   [name] 11/30/2020, 3:11 AM

## 2020-12-01 ENCOUNTER — Encounter: Payer: Self-pay | Admitting: Physical Medicine and Rehabilitation

## 2020-12-01 ENCOUNTER — Other Ambulatory Visit: Payer: Self-pay

## 2020-12-01 ENCOUNTER — Encounter
Payer: Federal, State, Local not specified - PPO | Attending: Physical Medicine and Rehabilitation | Admitting: Physical Medicine and Rehabilitation

## 2020-12-01 ENCOUNTER — Telehealth: Payer: Self-pay | Admitting: Medical

## 2020-12-01 VITALS — BP 145/94 | HR 85 | Temp 98.7°F | Ht 65.0 in | Wt 175.0 lb

## 2020-12-01 DIAGNOSIS — S22000A Wedge compression fracture of unspecified thoracic vertebra, initial encounter for closed fracture: Secondary | ICD-10-CM | POA: Diagnosis not present

## 2020-12-01 DIAGNOSIS — F1011 Alcohol abuse, in remission: Secondary | ICD-10-CM | POA: Diagnosis not present

## 2020-12-01 NOTE — Telephone Encounter (Signed)
Pt called and lvm to return call 

## 2020-12-01 NOTE — Progress Notes (Addendum)
Subjective:    Patient ID: Catherine Hubbard, female    DOB: 02-14-1961, 60 y.o.   MRN: [id number]  HPI  [name] presents for follow-up of lumbar radicular pain and cervical spine pain.  1) Cervical myofascial pain syndrome: -the trigger point injections helped last time. -She continues to have tension and would like to repeat these next time.    2) Impaired mobility and ADLs -She has difficulty lifting weights and this makes it hard for her to find a job.   3) Cervical spinal stenosis: She currently has pain radiating from her neck down to her ankles.  -The pain is constant, irritating, burning stabbing. -The Meloxicam is helping. She does not need a refill.   4) Alcohol abuse: -Has led to falls in the past.   5) spinal compression fracture: -fell off her bed and suffered a spinal compression fracture -she is wearing a spinal orthosis -she was prescribed oxycodone and used this once yesterday -she takes vitamin D every day.     Prior history: She threw things in her home last night due to her pain.   She has pain radiating into her legs, as well as swelling.   Her frustration level has been very high recently.   She would be interested in a repeat ESI and her pain is now present in the left lower extremity as well.   Prior history:  Lower back pain:  08/25/19: Her lower back pain is worst with extension and is present in her bilateral lower back. Since last visit she has been using a lumbar support brace which does provide some relief. She has been doing her HEP every day. She has had steroid injections in the past that did provide her with relief. She has pain in bilateral lower extremities as well in the anterior and lateral distributions. This pain is worse with movement-walking, standing, and when she lays flat.   10/06/19: On 1/21 she had a right sided L4-L5 epidural steroid injection with Dr. Wynn Banker. Her back pain is now much improved.    Bilateral hip  pain: She has had some bilateral hip pain. She has been doing exercises at home for this. But she is now able to walk a lot more. She sometimes goes to the grocery store for 2 hours at a time to do her shopping as well as get exercise in walking. She still has difficulty with stairs.    Poor energy:  2/16: Her energy is now much improved and she has stopped taking the Ritalin.    Depression:  1/05: She say a psychiatrist in the past and was prescribed Prozac, Effexor, and Cymbalta which she found to be helpful for her depression. Last visit we restarted her on Prozac and this did provide some relief. She has not been following with the psychiatrist any more and does not want to at this time.    2/16: Her mood is much improved with these medications and she is not experiencing any adverse side effects. Her friends tell her she is like a new person and she has excellent energy. 1 month ago she says she felt she could not return to work but now she feels that she may be able to.    Left CMC joint pain: She notes increased swelling in this joint compared to the opposite side and it can sometimes pain her a lot. She has a listed allergy to NSAIDs but said this was gastritis from ibuprofen more than 10 years ago.  She has since taken ibuprofen without issues. Has never had any imaging of her hand or splint or steroid injection.   6/9: She was dong really well except for a month ago, when her back pain and lowe leg cramping returned. She feels soreness in both legs as well as in her back. She would be interested in repeat steroid injection. She feels like her fatigue has worsened and she is sleeping 16 hours per day. She does not have interest in anything. She is most bored in the afternoons. Her husband tries to watch TV with her in the afternoons to stimulate her. She used to like to talk to her neighbors but she has had some issues of her own. Her roommate is a godfearing woman and she does not want to be  preached to from her.    Pain Inventory Average Pain 6 Pain Right Now 9 My pain is sharp, burning, dull, stabbing, tingling and aching  In the last 24 hours, has pain interfered with the following? General activity 10 Relation with others 10 Enjoyment of life 10 What TIME of day is your pain at its worst? all Sleep (in general) Poor  Pain is worse with: walking, bending, sitting, inactivity, standing and some activites Pain improves with: medication Relief from Meds: 2         Family History  Problem Relation Age of Onset  . Cancer Mother   . Stroke Mother   . Cancer Father    Social History   Socioeconomic History  . Marital status: Married    Spouse name: Not on file  . Number of children: Not on file  . Years of education: Not on file  . Highest education level: Not on file  Occupational History  . Not on file  Tobacco Use  . Smoking status: Never Smoker  . Smokeless tobacco: Never Used  . Tobacco comment: second hand smoker when young. Heavy exposure.  Vaping Use  . Vaping Use: Never used  Substance and Sexual Activity  . Alcohol use: Yes    Comment: Sts. she drinks beer, liquor daily, varying amts/fim  . Drug use: No  . Sexual activity: Not Currently  Other Topics Concern  . Not on file  Social History Narrative  . Not on file   Social Determinants of Health   Financial Resource Strain: Not on file  Food Insecurity: Not on file  Transportation Needs: Not on file  Physical Activity: Not on file  Stress: Not on file  Social Connections: Not on file   Past Surgical History:  Procedure Laterality Date  . ANKLE FRACTURE SURGERY Bilateral   . CARPAL TUNNEL RELEASE Bilateral   . CHOLECYSTECTOMY    . COLONOSCOPY  11/2016   multiple  . ESOPHAGOGASTRODUODENOSCOPY  11/2016  . ESOPHAGOGASTRODUODENOSCOPY N/A 02/01/2017   Procedure: ESOPHAGOGASTRODUODENOSCOPY (EGD);  Surgeon: Iva Boop, MD;  Location: [facility] ENDOSCOPY;  Service: Endoscopy;   Laterality: N/A;  . ESOPHAGOGASTRODUODENOSCOPY (EGD) WITH PROPOFOL N/A 02/04/2017   Procedure: ESOPHAGOGASTRODUODENOSCOPY (EGD) WITH PROPOFOL;  Surgeon: Meryl Dare, MD;  Location: [facility] ENDOSCOPY;  Service: Endoscopy;  Laterality: N/A;  . LAPAROSCOPIC GASTRIC SLEEVE RESECTION  2011  . ULNAR NERVE REPAIR Bilateral    Past Medical History:  Diagnosis Date  . Alcohol abuse   . Asthma   . Esophagitis   . GERD (gastroesophageal reflux disease)   . Headache   . Hearing loss   . Hypertension   . Multiple sclerosis (HCC)   . Pancreatitis   .  Vision abnormalities    BP (!) 145/94   Pulse 85   Temp 98.7 F (37.1 C)   Ht 5\' 5"  (1.651 m)   Wt 175 lb (79.4 kg)   LMP 08/13/2014 Comment: very few periods  SpO2 98%   BMI 29.12 kg/m   Opioid Risk Score:   Fall Risk Score:  `1  Depression screen PHQ 2/9  Depression screen [employer] 2/9 08/24/2020 02/09/2020 06/29/2019  Decreased Interest 1 3 3   Down, Depressed, Hopeless 1 3 3   PHQ - 2 Score 2 6 6   Altered sleeping - - 3  Tired, decreased energy - - 3  Change in appetite - - 3  Feeling bad or failure about yourself  - - 3  Trouble concentrating - - 3  Moving slowly or fidgety/restless - - 3  Suicidal thoughts - - 3  PHQ-9 Score - - 27  Difficult doing work/chores - - Extremely dIfficult  Some recent data might be hidden    Review of Systems  Constitutional: Negative.   Eyes: Negative.   Respiratory: Negative.   Gastrointestinal: Positive for constipation and diarrhea.  Endocrine: Negative.   Genitourinary: Negative.   Musculoskeletal: Positive for arthralgias, back pain, gait problem and myalgias.       Spasms  Skin: Positive for rash.  Allergic/Immunologic: Negative.   Neurological: Positive for dizziness and tremors.       Tingling  Psychiatric/Behavioral: Positive for dysphoric mood. The patient is nervous/anxious.   All other systems reviewed and are negative.      Objective:   Physical Exam Gen: no distress, normal  appearing HEENT: oral mucosa pink and moist, NCAT Cardio: Reg rate Chest: normal effort, normal rate of breathing Abd: soft, non-distended Ext: no edema Psych: pleasant, normal affect Skin: intact Ext: bilateral lower extremity edema Skin: bruising left arm Neuro/Musculoskeletal: Able to walk in office without falls with RW.  Remains sitting most of this session in contrast to last session. Able to bend down and touch her toes with FROM. Diffuse tenderness along spine with gentle palpation. Extension and oblique extension reproduce pain in lumbar spine. 5/5 strength in bilateral arms. 4-/5 in bilateral legs due to diffuse weakness. No focal motor or sensory deficits. + Slump test in bilateral lower extremities.   Has increased swelling at left [facility] joint with increased tenderness to palpation compared to opposite side.  Range of motion of neck limited to 40 degrees bilateral, tingling in neck reproduced with neck flexion.  Psych: pleasant, normal affect, attention is significantly improved from last visit.        Assessment & Plan:  Catherine Hubbard is a 60 y.o. female with history of alcohol abuse, multiple sclerosis and frequent falls, presenting for follow-up for her diffuse chronic pain and depression., cervical and lumbar spine pain, compression fracture.    1) Poor energy: The Ritalin has really helped with her attention and energy and has helped her to be more active. Her energy is tremendously improved and she no longer requires the Ritalin which she has stopped taking on her own.    2) Depression: Continue Prozac and Effexor for depression.She is experiencing worsening anhedonia and excessive sleep. Increase Prozac to 40mg .   -Has become more severe. Add Amitriptyline 10mg  HS.    Keep a daily log of goals and check them off every day.   Maintain social connections in your life. Continue exercising with your neighbors.    Walk at least 15 minutes every day, preferably  outside.  Look for local organizations and programs that can add meaning and friendship to your life.    Make an effort to do activities that you enjoy, such as going to the [employer], shopping, [employer] (cost is an issue here), consider return to work in a sedentary job.      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  -I have personally reviewed Lumbar MRI which shows mild-moderate stenosis. On 1/21 she had a right sided L4-L5 epidural steroid injection with Dr. Wynn Banker with great improvement in symptoms.  Will refer for repeat injection.  -Discussed Meloxicam as an option to consider in the future.    4) Bilateral greater trochanteric pain syndrome: Continue HEP. Educated regarding etiology and treatment of condition.   5) Impaired mobility and ADLs secondary to Multiple Sclerosis, Major Depression Disorder, L4-L5 lumbar stenosis: Patient's overall mobility and mental health are have regressed a little since last visit. She has been trying to obtain disability for financial support and I provided her with the address and phone number of the social security office and advised her that I am happy to help fill out any paperwork she needs. That being said, I advised her that returning to work on a part-time basis may be very good for her, improving her quality of life, physical function, giving her purpose, and improving her financial situation. She is agreeable to this and feels that she may be able to return to work and will look for jobs that interest her and that will not be too strenuous for her (avoiding lifting heavy weights or climbing stairs). She is still nervous about falling at work. -Encouraged to continue looking for work.    6) Left CMC arthritis: Prescribed diclofenac gel and advised that patient may apply up to 4 times per day. If this does not provide relief asked that patient call our office and I can order an XR and provide a referral for fitting for a  splint. If this does not provide relief, we can consider corticosteroid injection.   7) Alcohol abuse: She drinks at least 3 alcoholic beverages per day. She would like to quit. Discussed some of the risks of alcohol use. Made goal to stop alcohol use given compression fracture. Recommended replacing third drink with [place] tea.  -Discussed option of support groups or disulfiram.   8) Falls: related to alcohol use. If we can get this under control, the falls will decrease.   9) Disrupted circadian rhythm: Sometimes sleeps at 5am and sleeps during the day. Discussed sleep aides.   10) Cervical myofascial pain syndrome: -Repeat trigger point injections next visit.  11) Cervical spinal stenosis: -Continue Meloxicam, taking as needed.  -Keep up the turmeric.  -Discussed current symptoms of pain and history of pain.  -Discussed benefits of exercise in reducing pain. -Discussed following foods that may reduce pain: 1) Ginger 2) Blueberries 3) Salmon 4) Pumpkin seeds 5) dark chocolate 6) turmeric 7) tart cherries 8) virgin olive oil 9) chilli Hubbard 10) mint 11) red wine 12) garlic  Link to further information on diet for chronic pain: [email or web address]     All questions were encouraged and answered. Follow up with me in mid-April.   12) Compression fracture: -a little but of goal cheese and green leafy vegetables every day.  -daily 10 minute walk outside.  -can continue oxycodone for very severe pain as prescribed by NSGY. If pain is moderate can consider restarting meloxicam -discussed risks of femur fracture -discussed natural healing process

## 2020-12-01 NOTE — Telephone Encounter (Signed)
Pt has not seen me in some time. Will you call and get her scheduled. She definitley needs 40 minute appointment slot.

## 2020-12-03 ENCOUNTER — Other Ambulatory Visit: Payer: Self-pay | Admitting: Physical Medicine and Rehabilitation

## 2020-12-16 ENCOUNTER — Telehealth: Payer: Self-pay

## 2020-12-16 ENCOUNTER — Other Ambulatory Visit: Payer: Self-pay | Admitting: Physical Medicine and Rehabilitation

## 2020-12-16 MED ORDER — MELOXICAM 7.5 MG PO TABS: 7.5000 mg | ORAL_TABLET | Freq: Every day | ORAL | 1 refills | Status: DC

## 2020-12-16 MED ORDER — AMITRIPTYLINE HCL 10 MG PO TABS: ORAL_TABLET | ORAL | 1 refills | Status: DC

## 2020-12-16 NOTE — Telephone Encounter (Signed)
Refill request for Prozac 

## 2020-12-16 NOTE — Telephone Encounter (Signed)
Refill request for Meloxicam and Amitriptyline

## 2021-01-07 ENCOUNTER — Other Ambulatory Visit: Payer: Self-pay | Admitting: Physical Medicine and Rehabilitation

## 2021-01-17 ENCOUNTER — Encounter
Payer: Federal, State, Local not specified - PPO | Attending: Physical Medicine and Rehabilitation | Admitting: Physical Medicine and Rehabilitation

## 2021-01-17 DIAGNOSIS — F1011 Alcohol abuse, in remission: Secondary | ICD-10-CM | POA: Insufficient documentation

## 2021-01-17 DIAGNOSIS — S22000A Wedge compression fracture of unspecified thoracic vertebra, initial encounter for closed fracture: Secondary | ICD-10-CM | POA: Insufficient documentation

## 2021-02-24 ENCOUNTER — Encounter: Payer: Self-pay | Admitting: Physical Medicine and Rehabilitation

## 2021-02-24 ENCOUNTER — Other Ambulatory Visit: Payer: Self-pay

## 2021-02-24 ENCOUNTER — Encounter
Payer: Federal, State, Local not specified - PPO | Attending: Physical Medicine and Rehabilitation | Admitting: Physical Medicine and Rehabilitation

## 2021-02-24 VITALS — BP 133/88 | HR 90 | Temp 98.7°F | Ht 65.0 in

## 2021-02-24 DIAGNOSIS — M7918 Myalgia, other site: Secondary | ICD-10-CM | POA: Diagnosis not present

## 2021-02-24 DIAGNOSIS — F330 Major depressive disorder, recurrent, mild: Secondary | ICD-10-CM | POA: Diagnosis not present

## 2021-02-24 DIAGNOSIS — F1011 Alcohol abuse, in remission: Secondary | ICD-10-CM | POA: Insufficient documentation

## 2021-02-24 DIAGNOSIS — M4802 Spinal stenosis, cervical region: Secondary | ICD-10-CM | POA: Diagnosis not present

## 2021-02-24 MED ORDER — MELOXICAM 7.5 MG PO TABS: 7.5000 mg | ORAL_TABLET | Freq: Every day | ORAL | 1 refills | Status: DC

## 2021-02-24 MED ORDER — DISULFIRAM 500 MG PO TABS: 1.0000 | ORAL_TABLET | Freq: Every day | ORAL | 0 refills | Status: DC

## 2021-02-24 NOTE — Progress Notes (Signed)
Subjective:    Patient ID: Catherine Hubbard, female    DOB: 1961-07-29, 60 y.o.   MRN: [id number]  HPI  Catherine Hubbard presents for follow-up of lumbar radicular pain and cervical spine pain.  1) Cervical myofascial pain syndrome: -the trigger point injections helped last time. -She continues to have tension and would like to repeat the trigger point injections next visit.   2) Impaired mobility and ADLs -She has difficulty lifting weights and this makes it hard for her to find a job.   3) Cervical spinal stenosis: She currently has pain radiating from her neck down to her ankles.  -The pain is constant, irritating, burning stabbing. -The Meloxicam is helping, she needs a refill  4) Alcohol abuse: -Has led to falls in the past.  -she is doing better.   5) spinal compression fracture: -fell off her bed and suffered a spinal compression fracture -she is wearing a spinal orthosis -she was prescribed oxycodone and used this once yesterday -she takes vitamin D every day.   6) Depression -continues to be severe     Prior history: She threw things in her home last night due to her pain.   She has pain radiating into her legs, as well as swelling.   Her frustration level has been very high recently.   She would be interested in a repeat ESI and her pain is now present in the left lower extremity as well.   Prior history:  Lower back pain:  08/25/19: Her lower back pain is worst with extension and is present in her bilateral lower back. Since last visit she has been using a lumbar support brace which does provide some relief. She has been doing her HEP every day. She has had steroid injections in the past that did provide her with relief. She has pain in bilateral lower extremities as well in the anterior and lateral distributions. This pain is worse with movement-walking, standing, and when she lays flat.   10/06/19: On 1/21 she had a right sided L4-L5 epidural steroid  injection with Dr. Wynn Banker. Her back pain is now much improved.    Bilateral hip pain: She has had some bilateral hip pain. She has been doing exercises at home for this. But she is now able to walk a lot more. She sometimes goes to the grocery store for 2 hours at a time to do her shopping as well as get exercise in walking. She still has difficulty with stairs.    Poor energy:  2/16: Her energy is now much improved and she has stopped taking the Ritalin.    Depression:  1/05: She say a psychiatrist in the past and was prescribed Prozac, Effexor, and Cymbalta which she found to be helpful for her depression. Last visit we restarted her on Prozac and this did provide some relief. She has not been following with the psychiatrist any more and does not want to at this time.    2/16: Her mood is much improved with these medications and she is not experiencing any adverse side effects. Her friends tell her she is like a new person and she has excellent energy. 1 month ago she says she felt she could not return to work but now she feels that she may be able to.    Left CMC joint pain: She notes increased swelling in this joint compared to the opposite side and it can sometimes pain her a lot. She has a listed allergy to NSAIDs  but said this was gastritis from ibuprofen more than 10 years ago. She has since taken ibuprofen without issues. Has never had any imaging of her hand or splint or steroid injection.   6/9: She was dong really well except for a month ago, when her back pain and lowe leg cramping returned. She feels soreness in both legs as well as in her back. She would be interested in repeat steroid injection. She feels like her fatigue has worsened and she is sleeping 16 hours per day. She does not have interest in anything. She is most bored in the afternoons. Her husband tries to watch TV with her in the afternoons to stimulate her. She used to like to talk to her neighbors but she has had some  issues of her own. Her roommate is a godfearing woman and she does not want to be preached to from her.    Pain Inventory Average Pain 4  Pain Right Now 8 My pain is constant, sharp, burning, stabbing, and aching  In the last 24 hours, has pain interfered with the following? General activity 4 Relation with others 4 Enjoyment of life 4 What TIME of day is your pain at its worst?  all Sleep (in general) Fair  Pain is worse with: bending and some activites Pain improves with: medication Relief from Meds: 5         Family History  Problem Relation Age of Onset   Cancer Mother    Stroke Mother    Cancer Father    Social History   Socioeconomic History   Marital status: Married    Spouse name: Not on file   Number of children: Not on file   Years of education: Not on file   Highest education level: Not on file  Occupational History   Not on file  Tobacco Use   Smoking status: Never   Smokeless tobacco: Never   Tobacco comments:    second hand smoker when young. Heavy exposure.  Vaping Use   Vaping Use: Never used  Substance and Sexual Activity   Alcohol use: Yes    Comment: Sts. she drinks beer, liquor daily, varying amts/fim   Drug use: No   Sexual activity: Not Currently  Other Topics Concern   Not on file  Social History Narrative   Not on file   Social Determinants of Health   Financial Resource Strain: Not on file  Food Insecurity: Not on file  Transportation Needs: Not on file  Physical Activity: Not on file  Stress: Not on file  Social Connections: Not on file   Past Surgical History:  Procedure Laterality Date   ANKLE FRACTURE SURGERY Bilateral    CARPAL TUNNEL RELEASE Bilateral    CHOLECYSTECTOMY     COLONOSCOPY  11/2016   multiple   ESOPHAGOGASTRODUODENOSCOPY  11/2016   ESOPHAGOGASTRODUODENOSCOPY N/A 02/01/2017   Procedure: ESOPHAGOGASTRODUODENOSCOPY (EGD);  Surgeon: Iva Boop, MD;  Location: [facility] ENDOSCOPY;  Service: Endoscopy;   Laterality: N/A;   ESOPHAGOGASTRODUODENOSCOPY (EGD) WITH PROPOFOL N/A 02/04/2017   Procedure: ESOPHAGOGASTRODUODENOSCOPY (EGD) WITH PROPOFOL;  Surgeon: Meryl Dare, MD;  Location: [facility] ENDOSCOPY;  Service: Endoscopy;  Laterality: N/A;   LAPAROSCOPIC GASTRIC SLEEVE RESECTION  2011   ULNAR NERVE REPAIR Bilateral    Past Medical History:  Diagnosis Date   Alcohol abuse    Asthma    Esophagitis    GERD (gastroesophageal reflux disease)    Headache    Hearing loss    Hypertension  Multiple sclerosis (HCC)    Pancreatitis    Vision abnormalities    BP 133/88   Pulse 90   Temp 98.7 F (37.1 C)   Ht 5\' 5"  (1.651 m)   LMP 08/13/2014 Comment: very few periods  SpO2 97%   BMI 29.12 kg/m   Opioid Risk Score:   Fall Risk Score:  `1  Depression screen PHQ 2/9  Depression screen [facility] 2/9 08/24/2020 02/09/2020 06/29/2019  Decreased Interest 1 3 3   Down, Depressed, Hopeless 1 3 3   PHQ - 2 Score 2 6 6   Altered sleeping - - 3  Tired, decreased energy - - 3  Change in appetite - - 3  Feeling bad or failure about yourself  - - 3  Trouble concentrating - - 3  Moving slowly or fidgety/restless - - 3  Suicidal thoughts - - 3  PHQ-9 Score - - 27  Difficult doing work/chores - - Extremely dIfficult  Some recent data might be hidden    Review of Systems  Constitutional: Negative.   Eyes: Negative.   Respiratory: Negative.    Endocrine: Negative.   Genitourinary: Negative.   Allergic/Immunologic: Negative.        Objective:   Physical Exam Gen: no distress, normal appearing HEENT: oral mucosa pink and moist, NCAT Cardio: Reg rate Chest: normal effort, normal rate of breathing Abd: soft, non-distended Ext: bilateral lower extremity edema Skin: bruising left arm Neuro/Musculoskeletal: Able to walk in office without falls with RW.  Remains sitting most of this session in contrast to last session. Able to bend down and touch her toes with FROM. Diffuse tenderness along spine with  gentle palpation. Extension and oblique extension reproduce pain in lumbar spine. 5/5 strength in bilateral arms. 4-/5 in bilateral legs due to diffuse weakness. No focal motor or sensory deficits. + Slump test in bilateral lower extremities.   Has increased swelling at left [facility] joint with increased tenderness to palpation compared to opposite side.  Range of motion of neck limited to 40 degrees bilateral, tingling in neck reproduced with neck flexion.  Psych: pleasant, normal affect, attention is significantly improved from last visit.        Assessment & Plan:  Catherine Hubbard is a 61 y.o. female with history of alcohol abuse, multiple sclerosis and frequent falls, presenting for follow-up for her diffuse chronic pain and depression., cervical and lumbar spine pain, compression fracture.    1) Poor energy: The Ritalin has really helped with her attention and energy and has helped her to be more active. Her energy is tremendously improved and she no longer requires the Ritalin which she has stopped taking on her own.    2) Depression: Continue Prozac and Effexor for depression.She is experiencing worsening anhedonia and excessive sleep. Increase Prozac to 40mg .   -Has become more severe. Add Amitriptyline 10mg  HS.    Keep a daily log of goals and check them off every day.   Maintain social connections in your life. Continue exercising with your neighbors.    Walk at least 15 minutes every day, preferably outside.    Look for local organizations and programs that can add meaning and friendship to your life.    Make an effort to do activities that you enjoy, such as going to the , shopping, [employer] (cost is an issue here), consider return to work in a sedentary job.      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  -I have personally reviewed  Lumbar MRI which shows mild-moderate stenosis. On 1/21 she had a right sided L4-L5 epidural steroid  injection with Dr. Wynn Banker with great improvement in symptoms.  Will refer for repeat injection.  -Discussed Meloxicam as an option to consider in the future.    4) Bilateral greater trochanteric pain syndrome: Continue HEP. Educated regarding etiology and treatment of condition.   5) Impaired mobility and ADLs secondary to Multiple Sclerosis, Major Depression Disorder, L4-L5 lumbar stenosis: Patient's overall mobility and mental health are have regressed a little since last visit. She has been trying to obtain disability for financial support and I provided her with the address and phone number of the social security office and advised her that I am happy to help fill out any paperwork she needs. That being said, I advised her that returning to work on a part-time basis may be very good for her, improving her quality of life, physical function, giving her purpose, and improving her financial situation. She is agreeable to this and feels that she may be able to return to work and will look for jobs that interest her and that will not be too strenuous for her (avoiding lifting heavy weights or climbing stairs). She is still nervous about falling at work. -Encouraged to continue looking for work.    6) Left CMC arthritis: Prescribed diclofenac gel and advised that patient may apply up to 4 times per day. If this does not provide relief asked that patient call our office and I can order an XR and provide a referral for fitting for a splint. If this does not provide relief, we can consider corticosteroid injection.   7) Alcohol abuse: She drinks at least 3 alcoholic beverages per day. She would like to quit. Discussed some of the risks of alcohol use. Made goal to stop alcohol use given compression fracture. Recommended replacing third drink with Ro[place]ois tea.  -Discussed option of support groups or disulfiram.  -prescribed disulfiram  8) Falls: related to alcohol use. If we can get this under control,  the falls will decrease.   9) Disrupted circadian rhythm: Sometimes sleeps at 5am and sleeps during the day. Discussed sleep aides.   10) Cervical myofascial pain syndrome: -Repeat trigger point injections next visit.  11) Cervical spinal stenosis: -Continue Meloxicam, taking as needed.  -Keep up the turmeric.  -Discussed current symptoms of pain and history of pain.  -Discussed following foods that may reduce pain: 1) Ginger (especially studied for arthritis)- reduce leukotriene production to decrease inflammation 2) Blueberries- high in phytonutrients that decrease inflammation 3) Salmon- marine omega-3s reduce joint swelling and pain 4) Pumpkin seeds- reduce inflammation 5) dark chocolate- reduces inflammation 6) turmeric- reduces inflammation 7) tart cherries - reduce pain and stiffness 8) extra virgin olive oil - its compound olecanthal helps to block prostaglandins  9) chili Hubbard- can be eaten or applied topically via capsaicin 10) mint- helpful for headache, muscle aches, joint pain, and itching 11) garlic- reduces inflammation  Link to further information on diet for chronic pain: http://[email or web address].[email or web address]   Link to further information on diet for chronic pain: [email or web address].[email or web address]     All questions were encouraged and answered. Follow up with me in mid-April.   12) Compression fracture: -a little but of goal cheese and green leafy vegetables every day.  -daily 10 minute walk outside.  -can continue oxycodone for very severe pain as prescribed by NSGY. If pain is moderate can consider restarting meloxicam -discussed risks of femur fracture -discussed natural healing process

## 2021-03-07 ENCOUNTER — Other Ambulatory Visit: Payer: Self-pay | Admitting: Neurology

## 2021-03-16 DIAGNOSIS — N3001 Acute cystitis with hematuria: Secondary | ICD-10-CM | POA: Diagnosis not present

## 2021-03-16 DIAGNOSIS — R309 Painful micturition, unspecified: Secondary | ICD-10-CM | POA: Diagnosis not present

## 2021-04-02 ENCOUNTER — Other Ambulatory Visit: Payer: Self-pay | Admitting: Physical Medicine and Rehabilitation

## 2021-04-03 ENCOUNTER — Other Ambulatory Visit: Payer: Self-pay | Admitting: Medical

## 2021-04-15 ENCOUNTER — Other Ambulatory Visit: Payer: Self-pay | Admitting: Neurology

## 2021-04-21 ENCOUNTER — Ambulatory Visit: Payer: Federal, State, Local not specified - PPO | Admitting: Physical Medicine and Rehabilitation

## 2021-05-01 DIAGNOSIS — G35 Multiple sclerosis: Secondary | ICD-10-CM | POA: Diagnosis not present

## 2021-05-03 ENCOUNTER — Other Ambulatory Visit: Payer: Self-pay | Admitting: *Deleted

## 2021-05-04 MED ORDER — FLUOXETINE HCL 40 MG PO CAPS: ORAL_CAPSULE | ORAL | 3 refills | Status: DC

## 2021-05-15 ENCOUNTER — Ambulatory Visit: Payer: Federal, State, Local not specified - PPO | Attending: Internal Medicine

## 2021-05-15 ENCOUNTER — Other Ambulatory Visit (HOSPITAL_BASED_OUTPATIENT_CLINIC_OR_DEPARTMENT_OTHER): Payer: Self-pay

## 2021-05-15 DIAGNOSIS — Z23 Encounter for immunization: Secondary | ICD-10-CM

## 2021-05-15 MED ORDER — INFLUENZA VAC SPLIT QUAD 0.5 ML IM SUSY
PREFILLED_SYRINGE | INTRAMUSCULAR | 0 refills | Status: DC
  Filled 2021-05-15: qty 0.5, 1d supply, fill #0

## 2021-05-15 NOTE — Progress Notes (Signed)
   Covid-19 Vaccination Clinic  Name:  SHATHA HOOSER    MRN: [id number] DOB: 03-23-1961  05/15/2021  Ms. Butler-Taylor was observed post Covid-19 immunization for 15 minutes without incident. She was provided with Vaccine Information Sheet and instruction to access the V-Safe system.   Ms. Stlouis was instructed to call 911 with any severe reactions post vaccine: Difficulty breathing  Swelling of face and throat  A fast heartbeat  A bad rash all over body  Dizziness and weakness

## 2021-05-16 ENCOUNTER — Other Ambulatory Visit: Payer: Self-pay | Admitting: Physical Medicine and Rehabilitation

## 2021-05-22 ENCOUNTER — Other Ambulatory Visit (HOSPITAL_BASED_OUTPATIENT_CLINIC_OR_DEPARTMENT_OTHER): Payer: Self-pay

## 2021-05-22 MED ORDER — COVID-19MRNA BIVAL VACC PFIZER 30 MCG/0.3ML IM SUSP
INTRAMUSCULAR | 0 refills | Status: DC
  Filled 2021-05-22: qty 0.3, 1d supply, fill #0

## 2021-05-23 ENCOUNTER — Other Ambulatory Visit: Payer: Self-pay

## 2021-05-23 ENCOUNTER — Other Ambulatory Visit: Payer: Self-pay | Admitting: Medical

## 2021-05-23 ENCOUNTER — Encounter: Payer: Self-pay | Admitting: Medical

## 2021-05-23 ENCOUNTER — Ambulatory Visit: Payer: Federal, State, Local not specified - PPO | Admitting: Medical

## 2021-05-23 VITALS — BP 155/85 | HR 100 | Resp 18 | Ht 65.0 in | Wt 178.0 lb

## 2021-05-23 DIAGNOSIS — I10 Essential (primary) hypertension: Secondary | ICD-10-CM

## 2021-05-23 DIAGNOSIS — F418 Other specified anxiety disorders: Secondary | ICD-10-CM

## 2021-05-23 DIAGNOSIS — F1011 Alcohol abuse, in remission: Secondary | ICD-10-CM

## 2021-05-23 DIAGNOSIS — T148XXA Other injury of unspecified body region, initial encounter: Secondary | ICD-10-CM

## 2021-05-23 DIAGNOSIS — M542 Cervicalgia: Secondary | ICD-10-CM | POA: Diagnosis not present

## 2021-05-23 DIAGNOSIS — M5412 Radiculopathy, cervical region: Secondary | ICD-10-CM

## 2021-05-23 DIAGNOSIS — R748 Abnormal levels of other serum enzymes: Secondary | ICD-10-CM | POA: Diagnosis not present

## 2021-05-23 MED ORDER — GABAPENTIN 100 MG PO CAPS: 100.0000 mg | ORAL_CAPSULE | Freq: Three times a day (TID) | ORAL | 0 refills | Status: DC

## 2021-05-23 MED ORDER — METHYLPREDNISOLONE 4 MG PO TABS: ORAL_TABLET | ORAL | 0 refills | Status: DC

## 2021-05-23 NOTE — Progress Notes (Signed)
Subjective:    Patient ID: Catherine Hubbard, female    DOB: Apr 30, 1961, 60 y.o.   MRN: [id number]  HPI  Pt in for follow up.  Last time I saw pt was 02/18/2020.  Pt has some pain on both sides of her neck. Pain in neck that radiates down her left arm. She states when she tilts neck back will case pain to radiates toward her shoulder and down her arm. Recent radicular pain occurring for 2 weeks.    On review ct spine imaging. April 12, 2019  Disc levels: cervical spine spondylosis is seen with disc osteophyte complex uncovertebral osteophytes with neural foraminal narrowing.  Pt has tried ice, heat and tinze unit to left shoulder.    Hx of thoracic compression fracture.   On review of Physical med rehab office note in April reads.  HpI  [name] presents for follow-up of lumbar radicular pain and cervical spine pain.   1) Cervical myofascial pain syndrome: -the trigger point injections helped last time. -She continues to have tension and would like to repeat these next time.     2) Impaired mobility and ADLs -She has difficulty lifting weights and this makes it hard for her to find a job.    3) Cervical spinal stenosis: She currently has pain radiating from her neck down to her ankles.  -The pain is constant, irritating, burning stabbing. -The Meloxicam is helping. She does not need a refill.    4) Alcohol abuse: -Has led to falls in the past.    5) spinal compression fracture: -fell off her bed and suffered a spinal compression fracture -she is wearing a spinal orthosis -she was prescribed oxycodone and used this once yesterday -she takes vitamin D every day.  Hx of depression- pt has not seen her psychiatrist in more than a year. Physcial med placed her back on prozac. Pt states this does help.   Recent rt forearm brusise after slight bump injury to area 2 weeks ago.   Review of Systems  Constitutional:  Negative for chills, fatigue and fever.   Respiratory:  Negative for chest tightness and shortness of breath.   Cardiovascular:  Negative for chest pain and palpitations.  Gastrointestinal:  Negative for abdominal pain.  Musculoskeletal:  Positive for neck pain. Negative for back pain.  Skin:  Negative for rash.  Neurological:  Negative for dizziness, light-headedness and headaches.       Left upper ext radicular pain.  Hematological:  Negative for adenopathy. Does not bruise/bleed easily.  Psychiatric/Behavioral:  Positive for dysphoric mood. Negative for agitation, decreased concentration, self-injury and suicidal ideas. The patient is not nervous/anxious.    Past Medical History:  Diagnosis Date   Alcohol abuse    Asthma    Esophagitis    GERD (gastroesophageal reflux disease)    Headache    Hearing loss    Hypertension    Multiple sclerosis (HCC)    Pancreatitis    Vision abnormalities      Social History   Socioeconomic History   Marital status: Married    Spouse name: Not on file   Number of children: Not on file   Years of education: Not on file   Highest education level: Not on file  Occupational History   Not on file  Tobacco Use   Smoking status: Never   Smokeless tobacco: Never   Tobacco comments:    second hand smoker when young. Heavy exposure.  Vaping Use   Vaping  Use: Never used  Substance and Sexual Activity   Alcohol use: Yes    Comment: Sts. she drinks beer, liquor daily, varying amts/fim   Drug use: No   Sexual activity: Not Currently  Other Topics Concern   Not on file  Social History Narrative   Not on file   Social Determinants of Health   Financial Resource Strain: Not on file  Food Insecurity: Not on file  Transportation Needs: Not on file  Physical Activity: Not on file  Stress: Not on file  Social Connections: Not on file  Intimate Partner Violence: Not on file    Past Surgical History:  Procedure Laterality Date   ANKLE FRACTURE SURGERY Bilateral    CARPAL TUNNEL  RELEASE Bilateral    CHOLECYSTECTOMY     COLONOSCOPY  11/2016   multiple   ESOPHAGOGASTRODUODENOSCOPY  11/2016   ESOPHAGOGASTRODUODENOSCOPY N/A 02/01/2017   Procedure: ESOPHAGOGASTRODUODENOSCOPY (EGD);  Surgeon: Iva Boop, MD;  Location: [facility] ENDOSCOPY;  Service: Endoscopy;  Laterality: N/A;   ESOPHAGOGASTRODUODENOSCOPY (EGD) WITH PROPOFOL N/A 02/04/2017   Procedure: ESOPHAGOGASTRODUODENOSCOPY (EGD) WITH PROPOFOL;  Surgeon: Meryl Dare, MD;  Location: [facility] ENDOSCOPY;  Service: Endoscopy;  Laterality: N/A;   LAPAROSCOPIC GASTRIC SLEEVE RESECTION  2011   ULNAR NERVE REPAIR Bilateral     Family History  Problem Relation Age of Onset   Cancer Mother    Stroke Mother    Cancer Father     Allergies  Allergen Reactions   Ibuprofen Other (See Comments)    Cannot take due to gastric bypass   Nsaids Other (See Comments)    Gastric bypass    Current Outpatient Medications on File Prior to Visit  Medication Sig Dispense Refill   albuterol (PROVENTIL) (2.5 MG/3ML) 0.083% nebulizer solution Take 3 mLs (2.5 mg total) by nebulization every 6 (six) hours as needed for wheezing or shortness of breath. 150 mL 1   albuterol (VENTOLIN HFA) 108 (90 Base) MCG/ACT inhaler TAKE 2 PUFFS BY MOUTH EVERY 6 HOURS AS NEEDED FOR WHEEZE OR SHORTNESS OF BREATH 90 g 2   amitriptyline (ELAVIL) 10 MG tablet TAKE 1 TABLET BY MOUTH EVERYDAY AT BEDTIME 30 tablet 1   baclofen (LIORESAL) 10 MG tablet Take 1 tablet (10 mg total) by mouth 3 (three) times daily. 90 tablet 5   budesonide-formoterol (SYMBICORT) 80-4.5 MCG/ACT inhaler TAKE 2 PUFFS BY MOUTH TWICE A DAY 10.2 each 3   Cholecalciferol (D2000 ULTRA STRENGTH) 50 MCG (2000 UT) CAPS Take 1 capsule by mouth daily.      COVID-19 mRNA bivalent vaccine, Pfizer, injection Inject into the muscle. 0.3 mL 0   diclofenac Sodium (VOLTAREN) 1 % GEL APPLY 4 G TOPICALLY 4 (FOUR) TIMES DAILY. (Patient taking differently: Apply 4 g topically 4 (four) times daily. PRN) 400 g 1    Disulfiram 500 MG TABS TAKE 1 TABLET (500 MG TOTAL) BY MOUTH DAILY. 30 tablet 0   FLUoxetine (PROZAC) 20 MG capsule Take 1 capsule by mouth daily.     FLUoxetine (PROZAC) 40 MG capsule TAKE 1 CAPSULE BY MOUTH EVERY DAY 30 capsule 3   fluticasone (FLONASE) 50 MCG/ACT nasal spray Place 2 sprays into both nostrils daily. 16 mL 12   influenza vac split quadrivalent PF (FLUARIX) 0.5 ML injection Inject into the muscle. 0.5 mL 0   meloxicam (MOBIC) 7.5 MG tablet Take 1 tablet (7.5 mg total) by mouth daily. 30 tablet 1   modafinil (PROVIGIL) 200 MG tablet TAKE 1 TABLET BY MOUTH EVERY DAY 30  tablet 3   Multiple Vitamin (MULTIVITAMIN WITH MINERALS) TABS tablet Take 1 tablet by mouth daily.     OCREVUS 300 MG/10ML injection Inject 20 mLs (600 mg total) into the vein every 6 (six) months. 20 mL 1   olmesartan (BENICAR) 5 MG tablet TAKE 2 TABLETS BY MOUTH EVERY DAY 60 tablet 5   omeprazole (PRILOSEC) 20 MG capsule Take by mouth.     omeprazole (PRILOSEC) 20 MG capsule Take 2 capsules by mouth 2 (two) times daily.     ondansetron (ZOFRAN-ODT) 8 MG disintegrating tablet TAKE 1 TABLET (8 MG TOTAL) BY MOUTH EVERY 8 (EIGHT) HOURS AS NEEDED FOR NAUSEA OR VOMITING. 17 tablet 1   oxybutynin (DITROPAN) 5 MG tablet TAKE 1 TABLET BY MOUTH TWICE A DAY 60 tablet 2   venlafaxine XR (EFFEXOR-XR) 37.5 MG 24 hr capsule TAKE 1 CAPSULE BY MOUTH DAILY WITH BREAKFAST. 30 capsule 1   No current facility-administered medications on file prior to visit.    BP (!) 155/85   Pulse 100   Resp 18   Ht 5\' 5"  (1.651 m)   Wt 178 lb (80.7 kg)   LMP 08/13/2014 Comment: very few periods  SpO2 93%   BMI 29.62 kg/m       Objective:   Physical Exam  General Mental Status- Alert. General Appearance- Not in acute distress.   Skin General: Color- Normal Color. Moisture- Normal Moisture.  Neck Carotid Arteries- Normal color. Moisture- Normal Moisture. No carotid bruits. No JVD. Mid cervial spine tenderness.   Chest and Lung  Exam Auscultation: Breath Sounds:-Normal.  Cardiovascular Auscultation:Rythm- Regular. Murmurs & Other Heart Sounds:Auscultation of the heart reveals- No Murmurs. . Abdomen Inspection:-Inspeection Normal. Palpation/Percussion:Note:No mass. Palpation and Percussion of the abdomen reveal- Non Tender, Non Distended + BS, no rebound or guarding.    Neurologic Cranial Nerve exam:- CN III-XII intact(No nystagmus), symmetric smile. Strength:- 5/5 equal and symmetric strength both upper and lower extremities.       Assessment & Plan:   Patient Instructions  History of recent neck pain with radicular pain down left upper extremity intermittently.  Tilting head backwards.  Induce radiating features.  Prior CT neck in 2020 indicated osteophytes and neuroforaminal narrowing.  Will prescribe gabapentin.  Max dose will be 3 times daily.  However start using just 1 tablet at night and titrate up to twice daily in 1 week.  Then after 2 weeks can titrate up to 3 times daily.  Rx advisement given.  Also decided to go ahead and give you a 6-day course of tapered Medrol 4 mg dose.  History of depression and anxiety.  Improved some when you started back on the Prozac prescribed by physical medicine.  If this is not adequate please let me know and would want you to get back in with your psychiatrist.  History of hypertension and blood pressure moderate elevated when rechecked.  Today at 155/85.  Checking blood pressure daily because pain.  In addition already having the radicular pain recently.  Continue current blood pressure medication and check blood pressure daily.  Give me update on BP readings in 5 days.  Then will decide if needs additional medication needs to be added.  Recent bruising after bump to right forearm.  Could be normal variant.  Do want to get CBC to assess platelets today.  History of elevated liver enzymes.  Want to get metabolic panel today.  Follow-up in 3 weeks or sooner if  needed.   Time spent with patient  today was  41 minutes which consisted of chart review, discussing diagnosis, work up treatment and documentation.

## 2021-05-23 NOTE — Patient Instructions (Addendum)
History of recent neck pain with radicular pain down left upper extremity intermittently.  Tilting head backwards.  Induce radiating features.  Prior CT neck in 2020 indicated osteophytes and neuroforaminal narrowing.  Will prescribe gabapentin.  Max dose will be 3 times daily.  However start using just 1 tablet at night and titrate up to twice daily in 1 week.  Then after 2 weeks can titrate up to 3 times daily.  Rx advisement given.  Also decided to go ahead and give you a 6-day course of tapered Medrol 4 mg dose.  Placed referral to sports medicine for evaluation and treatment of neck pain .  History of depression and anxiety.  Improved some when you started back on the Prozac prescribed by physical medicine.  If this is not adequate please let me know and would want you to get back in with your psychiatrist.  History of hypertension and blood pressure moderate elevated when rechecked.  Today at 155/85.  Checking blood pressure daily because pain.  In addition already having the radicular pain recently.  Continue current blood pressure medication and check blood pressure daily.  Give me update on BP readings in 5 days.  Then will decide if needs additional medication needs to be added.  Recent bruising after bump to right forearm.  Could be normal variant.  Do want to get CBC to assess platelets today.  History of elevated liver enzymes.  Want to get metabolic panel today.  Follow-up in 3 weeks or sooner if needed.   Removed sports med referral when saw pt seeing physical medicine in 2 weeks.

## 2021-05-24 ENCOUNTER — Encounter (HOSPITAL_BASED_OUTPATIENT_CLINIC_OR_DEPARTMENT_OTHER): Payer: Self-pay

## 2021-05-24 ENCOUNTER — Telehealth: Payer: Self-pay

## 2021-05-24 ENCOUNTER — Other Ambulatory Visit: Payer: Self-pay

## 2021-05-24 ENCOUNTER — Emergency Department (HOSPITAL_BASED_OUTPATIENT_CLINIC_OR_DEPARTMENT_OTHER): Payer: Federal, State, Local not specified - PPO

## 2021-05-24 ENCOUNTER — Emergency Department (HOSPITAL_BASED_OUTPATIENT_CLINIC_OR_DEPARTMENT_OTHER)
Admission: EM | Admit: 2021-05-24 | Discharge: 2021-05-24 | Disposition: A | Discharge status: Home / Self Care | Payer: Federal, State, Local not specified - PPO | Attending: Emergency Medicine | Admitting: Emergency Medicine

## 2021-05-24 DIAGNOSIS — I10 Essential (primary) hypertension: Secondary | ICD-10-CM | POA: Diagnosis not present

## 2021-05-24 DIAGNOSIS — S2231XA Fracture of one rib, right side, initial encounter for closed fracture: Secondary | ICD-10-CM | POA: Diagnosis not present

## 2021-05-24 DIAGNOSIS — J45909 Unspecified asthma, uncomplicated: Secondary | ICD-10-CM | POA: Insufficient documentation

## 2021-05-24 DIAGNOSIS — Z79899 Other long term (current) drug therapy: Secondary | ICD-10-CM | POA: Insufficient documentation

## 2021-05-24 DIAGNOSIS — R000 Tachycardia, unspecified: Secondary | ICD-10-CM | POA: Diagnosis not present

## 2021-05-24 DIAGNOSIS — R0981 Nasal congestion: Secondary | ICD-10-CM | POA: Insufficient documentation

## 2021-05-24 DIAGNOSIS — Z7951 Long term (current) use of inhaled steroids: Secondary | ICD-10-CM | POA: Insufficient documentation

## 2021-05-24 DIAGNOSIS — R519 Headache, unspecified: Secondary | ICD-10-CM | POA: Diagnosis not present

## 2021-05-24 DIAGNOSIS — R059 Cough, unspecified: Secondary | ICD-10-CM | POA: Diagnosis not present

## 2021-05-24 DIAGNOSIS — N3001 Acute cystitis with hematuria: Secondary | ICD-10-CM | POA: Diagnosis not present

## 2021-05-24 DIAGNOSIS — Y900 Blood alcohol level of less than 20 mg/100 ml: Secondary | ICD-10-CM | POA: Insufficient documentation

## 2021-05-24 DIAGNOSIS — R0602 Shortness of breath: Secondary | ICD-10-CM | POA: Diagnosis not present

## 2021-05-24 DIAGNOSIS — D72829 Elevated white blood cell count, unspecified: Secondary | ICD-10-CM | POA: Diagnosis not present

## 2021-05-24 DIAGNOSIS — R109 Unspecified abdominal pain: Secondary | ICD-10-CM | POA: Diagnosis not present

## 2021-05-24 LAB — URINALYSIS, ROUTINE W REFLEX MICROSCOPIC
Bilirubin Urine: NEGATIVE
Glucose, UA: NEGATIVE mg/dL
Ketones, ur: 5 mg/dL — AB
Nitrite: POSITIVE — AB
Protein, ur: NEGATIVE mg/dL
Specific Gravity, Urine: 1.015 (ref 1.005–1.030)
pH: 6.5 (ref 5.0–8.0)

## 2021-05-24 LAB — COMPREHENSIVE METABOLIC PANEL
ALT: 10 U/L (ref 0–35)
ALT: 13 U/L (ref 0–44)
AST: 23 U/L (ref 0–37)
AST: 25 U/L (ref 15–41)
Albumin: 4.3 g/dL (ref 3.5–5.2)
Albumin: 4.1 g/dL (ref 3.5–5.0)
Alkaline Phosphatase: 90 U/L (ref 39–117)
Alkaline Phosphatase: 91 U/L (ref 38–126)
Anion gap: 11 (ref 5–15)
BUN: 10 mg/dL (ref 6–23)
BUN: 14 mg/dL (ref 6–20)
CO2: 29 mEq/L (ref 19–32)
CO2: 23 mmol/L (ref 22–32)
Calcium: 9.9 mg/dL (ref 8.4–10.5)
Calcium: 9.4 mg/dL (ref 8.9–10.3)
Chloride: 97 mEq/L (ref 96–112)
Chloride: 98 mmol/L (ref 98–111)
Creatinine, Ser: 0.72 mg/dL (ref 0.40–1.20)
Creatinine, Ser: 0.63 mg/dL (ref 0.44–1.00)
GFR, Estimated: >60 mL/min (ref >60)
GFR: 90.57 mL/min (ref >60.00)
Glucose, Bld: 86 mg/dL (ref 70–99)
Glucose, Bld: 114 mg/dL — ABNORMAL HIGH (ref 70–99)
Potassium: 4.6 mEq/L (ref 3.5–5.1)
Potassium: 3.8 mmol/L (ref 3.5–5.1)
Sodium: 136 mEq/L (ref 135–145)
Sodium: 132 mmol/L — ABNORMAL LOW (ref 135–145)
Total Bilirubin: 1 mg/dL (ref 0.2–1.2)
Total Bilirubin: 1.5 mg/dL — ABNORMAL HIGH (ref 0.3–1.2)
Total Protein: 6.9 g/dL (ref 6.0–8.3)
Total Protein: 7.4 g/dL (ref 6.5–8.1)

## 2021-05-24 LAB — CBC WITH DIFFERENTIAL/PLATELET
Abs Immature Granulocytes: 0.03 10*3/uL (ref 0.00–0.07)
Basophils Absolute: 0.2 10*3/uL — ABNORMAL HIGH (ref 0.0–0.1)
Basophils Absolute: 0.1 10*3/uL (ref 0.0–0.1)
Basophils Relative: 0.9 % (ref 0.0–3.0)
Basophils Relative: 1 %
Eosinophils Absolute: 0.2 10*3/uL (ref 0.0–0.7)
Eosinophils Absolute: 0 10*3/uL (ref 0.0–0.5)
Eosinophils Relative: 1.1 % (ref 0.0–5.0)
Eosinophils Relative: 0 %
HCT: 35.1 % — ABNORMAL LOW (ref 36.0–46.0)
HCT: 33.9 % — ABNORMAL LOW (ref 36.0–46.0)
Hemoglobin: 11.5 g/dL — ABNORMAL LOW (ref 12.0–15.0)
Hemoglobin: 11.3 g/dL — ABNORMAL LOW (ref 12.0–15.0)
Immature Granulocytes: 0 %
Lymphocytes Relative: 3.2 % — ABNORMAL LOW (ref 12.0–46.0)
Lymphocytes Relative: 6 %
Lymphs Abs: 0.7 10*3/uL (ref 0.7–4.0)
Lymphs Abs: 0.5 10*3/uL — ABNORMAL LOW (ref 0.7–4.0)
MCH: 30.1 pg (ref 26.0–34.0)
MCHC: 32.8 g/dL (ref 30.0–36.0)
MCHC: 33.3 g/dL (ref 30.0–36.0)
MCV: 92 fl (ref 78.0–100.0)
MCV: 90.4 fL (ref 80.0–100.0)
Monocytes Absolute: 1 10*3/uL (ref 0.1–1.0)
Monocytes Absolute: 0.3 10*3/uL (ref 0.1–1.0)
Monocytes Relative: 4.5 % (ref 3.0–12.0)
Monocytes Relative: 4 %
Neutro Abs: 20 10*3/uL — ABNORMAL HIGH (ref 1.4–7.7)
Neutro Abs: 7.9 10*3/uL — ABNORMAL HIGH (ref 1.7–7.7)
Neutrophils Relative %: 90.3 % — ABNORMAL HIGH (ref 43.0–77.0)
Neutrophils Relative %: 89 %
Platelets: 356 10*3/uL (ref 150.0–400.0)
Platelets: 362 10*3/uL (ref 150–400)
RBC: 3.82 Mil/uL — ABNORMAL LOW (ref 3.87–5.11)
RBC: 3.75 MIL/uL — ABNORMAL LOW (ref 3.87–5.11)
RDW: 13.8 % (ref 11.5–15.5)
RDW: 13.4 % (ref 11.5–15.5)
WBC: 22.2 10*3/uL (ref 4.0–10.5)
WBC: 8.8 10*3/uL (ref 4.0–10.5)
nRBC: 0 % (ref 0.0–0.2)

## 2021-05-24 LAB — RAPID URINE DRUG SCREEN, HOSP PERFORMED
Amphetamines: NOT DETECTED
Barbiturates: NOT DETECTED
Benzodiazepines: NOT DETECTED
Cocaine: NOT DETECTED
Opiates: NOT DETECTED
Tetrahydrocannabinol: NOT DETECTED

## 2021-05-24 LAB — LACTIC ACID, PLASMA: Lactic Acid, Venous: 1.1 mmol/L (ref 0.5–1.9)

## 2021-05-24 LAB — URINALYSIS, MICROSCOPIC (REFLEX)

## 2021-05-24 LAB — GAMMA GT: GGT: 25 U/L (ref 7–51)

## 2021-05-24 LAB — ETHANOL: Alcohol, Ethyl (B): <10 mg/dL (ref <10)

## 2021-05-24 LAB — APTT: aPTT: 27 seconds (ref 24–36)

## 2021-05-24 LAB — PROTIME-INR
INR: 1.1 (ref 0.8–1.2)
Prothrombin Time: 14.1 seconds (ref 11.4–15.2)

## 2021-05-24 MED ORDER — CEPHALEXIN 500 MG PO CAPS: 500.0000 mg | ORAL_CAPSULE | Freq: Two times a day (BID) | ORAL | 0 refills | Status: AC

## 2021-05-24 MED ORDER — LACTATED RINGERS IV BOLUS
1000.0000 mL | Freq: Once | INTRAVENOUS | Status: AC
  Administered 2021-05-24: 1000 mL via INTRAVENOUS

## 2021-05-24 NOTE — Telephone Encounter (Signed)
CRITICAL VALUE STICKER  CRITICAL VALUE: WBC 22.2  RECEIVER (on-site recipient of call): Rosita Kea, RMA  DATE & TIME NOTIFIED: 05/24/2021 at 11:00 am   MESSENGER (representative from lab): Luellen Pucker   MD NOTIFIED: [name], PA-C  TIME OF NOTIFICATION: 11:05 am   RESPONSE:

## 2021-05-24 NOTE — ED Provider Notes (Signed)
[facility] EMERGENCY DEPARTMENT Provider Note   CSN: [id number] Arrival date & time: 05/24/21  1431     History Chief Complaint  Patient presents with   Abnormal Lab    Catherine Hubbard is a 60 y.o. female with history of multiple sclerosis, alcohol abuse, asthma, and hypertension who presents with abnormal labs from PCP.  Patient presented for annual follow-up to her primary care yesterday and got routine lab work, resulted with a elevated white blood cell count of 22,000.  Patient was called with the results and was questioned about signs/symptoms of infections.  She states that she has had headache, nasal congestion, coughing, and shortness of breath for the past 2 to 3 weeks.  No aggravating or alleviating factors.  She also states that she has been feeling fatigued and "out of it" for the past month or so.  She originally thought that her symptoms were due to allergies and she was taking over-the-counter medications and Symbicort.  She is also complaining of bilateral flank pain.  Denies dysuria, hematuria, increased urinary frequency or urgency.   Abnormal Lab     Past Medical History:  Diagnosis Date   Alcohol abuse    Asthma    Esophagitis    GERD (gastroesophageal reflux disease)    Headache    Hearing loss    Hypertension    Multiple sclerosis (HCC)    Pancreatitis    Vision abnormalities     Patient Active Problem List   Diagnosis Date Noted   History of alcohol abuse 12/02/2019   Multiple rib fractures 04/14/2019   Multiple fractures of ribs, bilateral, initial encounter for closed fracture 04/14/2019   Closed 3-part fracture of proximal humerus with routine healing 04/01/2019   Multiple sclerosis exacerbation (HCC) 11/16/2018   Exacerbation of multiple sclerosis (HCC) 11/16/2018   Pneumonia 09/26/2018   Elevated troponin 09/26/2018   Acute hypokalemia 09/25/2018   Arthritis of carpometacarpal joint 03/27/2018   Nocturnal leg cramps  03/26/2018   Syncope due to orthostatic hypotension 02/12/2018   Diarrhea 02/08/2018   Acute lower UTI 02/08/2018   Hematoma of scalp 02/08/2018   Neck pain 01/20/2018   Foreign body in esophagus    Esophageal stricture    Intractable nausea and vomiting 01/31/2017   Dysphagia    Severe protein-calorie malnutrition (HCC)    Nausea & vomiting 01/30/2017   Hypokalemia 01/30/2017   Acute kidney injury (HCC) 01/30/2017   Cystitis 01/30/2017   Costochondritis 05/11/2016   Trochanteric bursitis of both hips 03/28/2016   Upper back pain 03/28/2016   Bilateral low back pain with bilateral sciatica 07/13/2015   Right hip pain 07/13/2015   Lumbar radicular pain 03/21/2015   Other fatigue 01/11/2015   Dysesthesia 01/11/2015   Urinary urgency 01/11/2015   Multiple sclerosis (HCC) 10/06/2014   Numbness 10/06/2014   Ataxic gait 10/06/2014   High risk medication use 10/06/2014   Gastric bypass status for obesity 10/06/2014   Cognitive changes 10/06/2014   Depression with anxiety 10/06/2014   Restless leg 10/06/2014   Insomnia 10/06/2014   Essential hypertension 10/06/2014   Difficulty hearing 01/13/2014   Other muscle spasm 01/13/2014    Past Surgical History:  Procedure Laterality Date   ANKLE FRACTURE SURGERY Bilateral    CARPAL TUNNEL RELEASE Bilateral    CHOLECYSTECTOMY     COLONOSCOPY  11/2016   multiple   ESOPHAGOGASTRODUODENOSCOPY  11/2016   ESOPHAGOGASTRODUODENOSCOPY N/A 02/01/2017   Procedure: ESOPHAGOGASTRODUODENOSCOPY (EGD);  Surgeon: Iva Boop, MD;  Location: [facility] ENDOSCOPY;  Service: Endoscopy;  Laterality: N/A;   ESOPHAGOGASTRODUODENOSCOPY (EGD) WITH PROPOFOL N/A 02/04/2017   Procedure: ESOPHAGOGASTRODUODENOSCOPY (EGD) WITH PROPOFOL;  Surgeon: Meryl Dare, MD;  Location: [facility] ENDOSCOPY;  Service: Endoscopy;  Laterality: N/A;   LAPAROSCOPIC GASTRIC SLEEVE RESECTION  2011   ULNAR NERVE REPAIR Bilateral      OB History   No obstetric history on file.      Family History  Problem Relation Age of Onset   Cancer Mother    Stroke Mother    Cancer Father     Social History   Tobacco Use   Smoking status: Never   Smokeless tobacco: Never   Tobacco comments:    second hand smoker when young. Heavy exposure.  Vaping Use   Vaping Use: Never used  Substance Use Topics   Alcohol use: Yes    Comment: daily   Drug use: No    Home Medications Prior to Admission medications   Medication Sig Start Date End Date Taking? Authorizing Provider  albuterol (PROVENTIL) (2.5 MG/3ML) 0.083% nebulizer solution Take 3 mLs (2.5 mg total) by nebulization every 6 (six) hours as needed for wheezing or shortness of breath. 11/26/19   Saguier, Ramon Dredge, PA-C  albuterol (VENTOLIN HFA) 108 (90 Base) MCG/ACT inhaler TAKE 2 PUFFS BY MOUTH EVERY 6 HOURS AS NEEDED FOR WHEEZE OR SHORTNESS OF BREATH 05/23/21   Saguier, Ramon Dredge, PA-C  amitriptyline (ELAVIL) 10 MG tablet TAKE 1 TABLET BY MOUTH EVERYDAY AT BEDTIME 12/16/20   Raulkar, Drema Pry, MD  baclofen (LIORESAL) 10 MG tablet Take 1 tablet (10 mg total) by mouth 3 (three) times daily. 06/01/20   Lomax, Amy, NP  budesonide-formoterol (SYMBICORT) 80-4.5 MCG/ACT inhaler TAKE 2 PUFFS BY MOUTH TWICE A DAY 07/11/20   Saguier, Ramon Dredge, PA-C  cephALEXin (KEFLEX) 500 MG capsule Take 1 capsule (500 mg total) by mouth 2 (two) times daily for 7 days. 05/24/21 05/31/21 Yes Manha Amato T, PA-C  Cholecalciferol (D2000 ULTRA STRENGTH) 50 MCG (2000 UT) CAPS Take 1 capsule by mouth daily.     [provider]  COVID-19 mRNA bivalent vaccine, Pfizer, injection Inject into the muscle. 05/15/21   Judyann Munson, MD  diclofenac Sodium (VOLTAREN) 1 % GEL APPLY 4 G TOPICALLY 4 (FOUR) TIMES DAILY. Patient taking differently: Apply 4 g topically 4 (four) times daily. PRN 12/24/19   Raulkar, Drema Pry, MD  Disulfiram 500 MG TABS TAKE 1 TABLET (500 MG TOTAL) BY MOUTH DAILY. 05/16/21   Raulkar, Drema Pry, MD  FLUoxetine (PROZAC) 20 MG  capsule Take 1 capsule by mouth daily. 03/13/17   [provider]  FLUoxetine (PROZAC) 40 MG capsule TAKE 1 CAPSULE BY MOUTH EVERY DAY 05/04/21   Raulkar, Drema Pry, MD  fluticasone (FLONASE) 50 MCG/ACT nasal spray Place 2 sprays into both nostrils daily. 08/25/20   Saguier, Ramon Dredge, PA-C  gabapentin (NEURONTIN) 100 MG capsule Take 1 capsule (100 mg total) by mouth 3 (three) times daily. 05/23/21   Saguier, Ramon Dredge, PA-C  influenza vac split quadrivalent PF (FLUARIX) 0.5 ML injection Inject into the muscle. 05/15/21   Judyann Munson, MD  meloxicam (MOBIC) 7.5 MG tablet Take 1 tablet (7.5 mg total) by mouth daily. 02/24/21   Horton Chin, MD  methylPREDNISolone (MEDROL) 4 MG tablet Standard 6 day taper 05/23/21   Saguier, Ramon Dredge, PA-C  modafinil (PROVIGIL) 200 MG tablet TAKE 1 TABLET BY MOUTH EVERY DAY 11/02/20   Sater, Pearletha Furl, MD  Multiple Vitamin (MULTIVITAMIN WITH MINERALS) TABS tablet Take  1 tablet by mouth daily.    [provider]  OCREVUS 300 MG/10ML injection Inject 20 mLs (600 mg total) into the vein every 6 (six) months. 10/05/19   Lomax, Amy, NP  olmesartan (BENICAR) 5 MG tablet TAKE 2 TABLETS BY MOUTH EVERY DAY 04/03/21   Saguier, Ramon Dredge, PA-C  omeprazole (PRILOSEC) 20 MG capsule Take by mouth. 07/09/20   [provider]  omeprazole (PRILOSEC) 20 MG capsule Take 2 capsules by mouth 2 (two) times daily. 09/20/20   [provider]  ondansetron (ZOFRAN-ODT) 8 MG disintegrating tablet TAKE 1 TABLET (8 MG TOTAL) BY MOUTH EVERY 8 (EIGHT) HOURS AS NEEDED FOR NAUSEA OR VOMITING. 09/08/19   Saguier, Ramon Dredge, PA-C  oxybutynin (DITROPAN) 5 MG tablet TAKE 1 TABLET BY MOUTH TWICE A DAY 04/17/21   Sater, Pearletha Furl, MD  venlafaxine XR (EFFEXOR-XR) 37.5 MG 24 hr capsule TAKE 1 CAPSULE BY MOUTH DAILY WITH BREAKFAST. 01/09/21   Raulkar, Drema Pry, MD    Allergies    Ibuprofen and Nsaids  Review of Systems   Review of Systems  Constitutional:  Negative for chills and fever.   HENT:  Positive for congestion.   Eyes:  Negative for visual disturbance.  Respiratory:  Negative for shortness of breath.   Cardiovascular:  Negative for chest pain.  Gastrointestinal:  Positive for constipation and nausea. Negative for abdominal pain, diarrhea and vomiting.  Genitourinary:  Positive for flank pain. Negative for dysuria, frequency, hematuria and urgency.  Neurological:  Positive for headaches.   Physical Exam Updated Vital Signs BP (!) 152/101   Pulse 81   Temp 98.4 F (36.9 C) (Oral)   Resp 16   Ht 5\' 5"  (1.651 m)   Wt 81.2 kg   LMP 08/13/2014 Comment: very few periods  SpO2 95%   BMI 29.79 kg/m   Physical Exam Vitals and nursing note reviewed.  Constitutional:      Appearance: Normal appearance.  HENT:     Head: Normocephalic and atraumatic.  Eyes:     Conjunctiva/sclera: Conjunctivae normal.  Cardiovascular:     Rate and Rhythm: Normal rate and regular rhythm.  Pulmonary:     Effort: Pulmonary effort is normal. No respiratory distress.     Breath sounds: Normal breath sounds.  Abdominal:     General: There is no distension.     Palpations: Abdomen is soft.     Tenderness: There is no abdominal tenderness. There is no right CVA tenderness, left CVA tenderness, guarding or rebound.  Skin:    General: Skin is warm and dry.  Neurological:     General: No focal deficit present.     Mental Status: She is alert.    ED Results / Procedures / Treatments   Labs (all labs ordered are listed, but only abnormal results are displayed) Labs Reviewed  COMPREHENSIVE METABOLIC PANEL - Abnormal; Notable for the following components:      Result Value   Sodium 132 (*)    Glucose, Bld 114 (*)    Total Bilirubin 1.5 (*)    All other components within normal limits  CBC WITH DIFFERENTIAL/PLATELET - Abnormal; Notable for the following components:   RBC 3.75 (*)    Hemoglobin 11.3 (*)    HCT 33.9 (*)    Neutro Abs 7.9 (*)    Lymphs Abs 0.5 (*)    All other  components within normal limits  URINALYSIS, ROUTINE W REFLEX MICROSCOPIC - Abnormal; Notable for the following components:   APPearance HAZY (*)  Hgb urine dipstick SMALL (*)    Ketones, ur 5 (*)    Nitrite POSITIVE (*)    Leukocytes,Ua SMALL (*)    All other components within normal limits  URINALYSIS, MICROSCOPIC (REFLEX) - Abnormal; Notable for the following components:   Bacteria, UA MANY (*)    All other components within normal limits  LACTIC ACID, PLASMA  PROTIME-INR  APTT  RAPID URINE DRUG SCREEN, HOSP PERFORMED  ETHANOL    EKG EKG Interpretation  Date/Time:  Wednesday May 24 2021 16:09:16 EDT Ventricular Rate:  91 PR Interval:  174 QRS Duration: 113 QT Interval:  415 QTC Calculation: 511 R Axis:   -44 Text Interpretation: Sinus rhythm Ventricular premature complex PVC otherwise no sig change from previous Confirmed by Arby Barrette [phone]) on 05/24/2021 5:28:13 PM  Radiology DG Chest Port 1 View  Result Date: 05/24/2021 CLINICAL DATA:  Questionable sepsis. Elevated white blood cell count. EXAM: PORTABLE CHEST 1 VIEW COMPARISON:  Chest x-ray 02/18/2020, CT chest 04/14/2019 FINDINGS: The heart and mediastinal contours are within normal limits. No focal consolidation. No pulmonary edema. No pleural effusion. No pneumothorax. No acute osseous abnormality. Old healed right rib fractures. Old healed left rib fractures. IMPRESSION: No active disease. Electronically Signed   By: Tish Frederickson M.D.   On: 05/24/2021 16:18    Procedures Procedures   Medications Ordered in ED Medications  lactated ringers bolus 1,000 mL (0 mLs Intravenous Stopped 05/24/21 1723)    ED Course  I have reviewed the triage vital signs and the nursing notes.  Pertinent labs & imaging results that were available during my care of the patient were reviewed by me and considered in my medical decision making (see chart for details).    MDM Rules/Calculators/A&P                            Patient is 60 y/o female with history of multiple sclerosis, alcohol abuse, asthma, and hypertension who presents with leukocytosis of 22,000 from PCP yesterday.  Patient states that she has had nasal congestion, cough, shortness of breath for the past 3 weeks.  She had tried over-the-counter medications and Symbicort without relief.  She also endorses flank pain, no dysuria, hematuria, increased urinary frequency or urgency.  On evaluation patient is afebrile and tachycardic to 103.  Work-up based on septic presentation due to leukocytosis and tachycardia, without obvious source of infection.  Patient reports last alcoholic drink several days ago, she takes disulfiram.  I am not concerned at this time for acute alcohol withdrawal, but plan to obtain ethanol level and urine drug screen to rule out.   5:39pm On repeat lab work patient has normal white blood cell count.  Urinalysis showed urinary tract infection with microscopic hematuria.  All other lab work unremarkable.  On reevaluation patient is stable, not tachycardic, and in no acute distress.  She is not requiring admission or inpatient treatment for symptoms at this time.  Plan to discharge home with antibiotics and PCP follow up.  Discussed reasons to return to the emergency department.  Patient agreeable to plan.  Patient discussed and seen in conjunction with attending physician Dr. Donnald Garre who agrees with above plan.   Final Clinical Impression(s) / ED Diagnoses Final diagnoses:  Acute cystitis with hematuria    Rx / DC Orders ED Discharge Orders          Ordered    cephALEXin (KEFLEX) 500 MG capsule  2 times  daily        05/24/21 1914             Jeanella Flattery 05/24/21 Madaline Savage, MD 06/05/21 [phone]

## 2021-05-24 NOTE — ED Notes (Signed)
Pt denies fever. 

## 2021-05-24 NOTE — Discharge Instructions (Addendum)
You are seen in the emergency department today after abnormal labs.    We repeated these labs and your white blood cell count is actually in a normal range.  We did however find on your urine specimen that you do have a urinary tract infection.  I am prescribing antibiotics for you to take by mouth twice daily for the next 7 days.  While you are taking this I recommend taking a probiotic or eating foods that have probiotics in it, like yogurt.  For your headache I would recommend taking ibuprofen more routinely, or trying Excedrin.  Please follow the instructions on the bottle for dosing.  Make sure that you are drinking lots of water and getting adequate rest.  Continue to monitor how you're doing and return to the ER for new or worsening symptoms such as headaches or fevers. It has been a pleasure seeing and caring for you today and I hope you start feeling better soon!

## 2021-05-24 NOTE — ED Notes (Signed)
Pt ambulatory with steady gait 

## 2021-05-24 NOTE — ED Notes (Signed)
ED Provider at bedside. 

## 2021-05-24 NOTE — ED Triage Notes (Signed)
Pt states she was seen by PCP yesterday-was called today and advised to come to ED due to abnormal labs-NAD-steady/slow gait

## 2021-05-24 NOTE — ED Notes (Signed)
Unsuccessful IV attempt, labs were drawn. PT tolerated well.

## 2021-05-26 ENCOUNTER — Other Ambulatory Visit: Payer: Self-pay | Admitting: Medical

## 2021-05-26 ENCOUNTER — Telehealth: Payer: Self-pay | Admitting: Medical

## 2021-05-26 MED ORDER — ALBUTEROL SULFATE HFA 108 (90 BASE) MCG/ACT IN AERS: INHALATION_SPRAY | RESPIRATORY_TRACT | 2 refills | Status: DC

## 2021-05-26 NOTE — Telephone Encounter (Signed)
Rx sent 

## 2021-05-26 NOTE — Telephone Encounter (Signed)
Medication: albuterol (VENTOLIN HFA) 108 (90 Base) MCG/ACT inhaler  Has the patient contacted their pharmacy? Yes.   (If no, request that the patient contact the pharmacy for the refill.) (If yes, when and what did the pharmacy advise?)  Preferred Pharmacy (with phone number or street name): [id number], [place] - [address]  [address] [place] [address]  Phone:  [phone]  Fax:  [phone]   Agent: Please be advised that RX refills may take up to 3 business days. We ask that you follow-up with your pharmacy.

## 2021-06-03 ENCOUNTER — Telehealth: Payer: Self-pay | Admitting: Medical

## 2021-06-03 NOTE — Telephone Encounter (Signed)
Need 10:40 appointment blocked on next day she is scheduled to see me. Pt has multiple medical problems come in to see me sporadically. Many ED visits and occasional hospitalization.   Historically always ending up needing  close to or over 40 minutes with her.

## 2021-06-05 NOTE — Telephone Encounter (Signed)
I ended up extending pt's appt to 40 mins on 06/13/2021 and moving the 10:20 pt to 10:40 so you would have enough time with this pt.  I then fixed pt modifiers so each visit with her would be 40 mins here on out.

## 2021-06-05 NOTE — Telephone Encounter (Signed)
1040 slot blocked by JL

## 2021-06-06 ENCOUNTER — Encounter: Payer: Self-pay | Admitting: Physical Medicine and Rehabilitation

## 2021-06-06 ENCOUNTER — Encounter
Payer: Federal, State, Local not specified - PPO | Attending: Physical Medicine and Rehabilitation | Admitting: Physical Medicine and Rehabilitation

## 2021-06-06 ENCOUNTER — Other Ambulatory Visit: Payer: Self-pay

## 2021-06-06 VITALS — BP 128/87 | HR 86 | Temp 97.8°F | Ht 65.0 in | Wt 177.0 lb

## 2021-06-06 DIAGNOSIS — M4802 Spinal stenosis, cervical region: Secondary | ICD-10-CM

## 2021-06-06 DIAGNOSIS — F332 Major depressive disorder, recurrent severe without psychotic features: Secondary | ICD-10-CM

## 2021-06-06 DIAGNOSIS — G43701 Chronic migraine without aura, not intractable, with status migrainosus: Secondary | ICD-10-CM

## 2021-06-06 MED ORDER — AMITRIPTYLINE HCL 10 MG PO TABS: ORAL_TABLET | ORAL | 1 refills | Status: DC

## 2021-06-06 NOTE — Patient Instructions (Signed)
Foods to alleviate migraine:  1) dark leafy greens 2) avocado 3) tuna 4) samon and mackerel 5) beans and legumes Supplements that can be helpful: feverfew, B12, and magnesium  Foods to avoid in migraine: 1) Excessive (or irregular timing) coffee 2) red wine 3) aged cheeses 4) chocolate 5) citrus fruits 6) aspartame and other artifical sweeteners 7) yeast 8) MSG (in processed foods) 9) processed and cured meats 10) nuts and certain seeds 11) chicken livers and other organ meats 12) dairy products like buttermilk, sour cream, and yogurt 13) dried fruits like dates, figs, and raisins 14) garlic 15) onions 16) potato chips 17) pickled foods like olives and sauerkraut 18) some fresh fruits like ripe banana, papaya, red plums, raspberries, kiwi, pineapple 19) tomato-based products  Recommend to keep a migraine diary: rate daily the severity of your headache (1-10) and what foods you eat that day to help determine patterns.   

## 2021-06-06 NOTE — Progress Notes (Signed)
Subjective:    Patient ID: Catherine Hubbard, female    DOB: August 20, 1961, 60 y.o.   MRN: [id number]  HPI  Catherine Hubbard presents for follow-up of lumbar radicular pain and cervical spine pain.  1) Cervical myofascial pain syndrome: -the trigger point injections helped last time. -She continues to have tension and would like to repeat the trigger point injections next visit.   2) Impaired mobility and ADLs -She has difficulty lifting weights and this makes it hard for her to find a job.   3) Cervical spinal stenosis: She currently has pain radiating from her neck down to her ankles.  -The pain is constant, irritating, burning stabbing. -The Meloxicam is helping- she takes when pain is getting worse.  -having pain radiating down her left arm -very bothersome to her -worse when she leans forward to watch TV and when she leans neck backward Radiates down to the lateral aspect of her arm.   4) Alcohol abuse: -Has led to falls in the past.  -she is doing better.   5) spinal compression fracture: -fell off her bed and suffered a spinal compression fracture -she is wearing a spinal orthosis -she was prescribed oxycodone and used this once yesterday -she takes vitamin D every day.   6) Depression -fluctuates in severity -still taking effexor and Prozac.  -has had recent deaths in her family.      Prior history: She threw things in her home last night due to her pain.   She has pain radiating into her legs, as well as swelling.   Her frustration level has been very high recently.   She would be interested in a repeat ESI and her pain is now present in the left lower extremity as well.   Prior history:  Lower back pain:  08/25/19: Her lower back pain is worst with extension and is present in her bilateral lower back. Since last visit she has been using a lumbar support brace which does provide some relief. She has been doing her HEP every day. She has had steroid  injections in the past that did provide her with relief. She has pain in bilateral lower extremities as well in the anterior and lateral distributions. This pain is worse with movement-walking, standing, and when she lays flat.   10/06/19: On 1/21 she had a right sided L4-L5 epidural steroid injection with Dr. Wynn Banker. Her back pain is now much improved.    Bilateral hip pain: She has had some bilateral hip pain. She has been doing exercises at home for this. But she is now able to walk a lot more. She sometimes goes to the grocery store for 2 hours at a time to do her shopping as well as get exercise in walking. She still has difficulty with stairs.    Poor energy:  2/16: Her energy is now much improved and she has stopped taking the Ritalin.    Depression:  1/05: She say a psychiatrist in the past and was prescribed Prozac, Effexor, and Cymbalta which she found to be helpful for her depression. Last visit we restarted her on Prozac and this did provide some relief. She has not been following with the psychiatrist any more and does not want to at this time.    2/16: Her mood is much improved with these medications and she is not experiencing any adverse side effects. Her friends tell her she is like a new person and she has excellent energy. 1 month ago she says  she felt she could not return to work but now she feels that she may be able to.    Left CMC joint pain: She notes increased swelling in this joint compared to the opposite side and it can sometimes pain her a lot. She has a listed allergy to NSAIDs but said this was gastritis from ibuprofen more than 10 years ago. She has since taken ibuprofen without issues. Has never had any imaging of her hand or splint or steroid injection.   6/9: She was dong really well except for a month ago, when her back pain and lowe leg cramping returned. She feels soreness in both legs as well as in her back. She would be interested in repeat steroid  injection. She feels like her fatigue has worsened and she is sleeping 16 hours per day. She does not have interest in anything. She is most bored in the afternoons. Her husband tries to watch TV with her in the afternoons to stimulate her. She used to like to talk to her neighbors but she has had some issues of her own. Her roommate is a godfearing woman and she does not want to be preached to from her.    Pain Inventory Average Pain 3  Pain Right Now 8 My pain is intermittent, sharp, burning, and aching  In the last 24 hours, has pain interfered with the following? General activity 3 Relation with others 3 Enjoyment of life 3 What TIME of day is your pain at its worst? morning,daytime, evening, night, anytime Sleep (in general) Fair  Pain is worse with: sitting Pain improves with: heat/ice and TENS Relief from Meds: 2         Family History  Problem Relation Age of Onset   Cancer Mother    Stroke Mother    Cancer Father    Social History   Socioeconomic History   Marital status: Married    Spouse name: Not on file   Number of children: Not on file   Years of education: Not on file   Highest education level: Not on file  Occupational History   Not on file  Tobacco Use   Smoking status: Never   Smokeless tobacco: Never   Tobacco comments:    second hand smoker when young. Heavy exposure.  Vaping Use   Vaping Use: Never used  Substance and Sexual Activity   Alcohol use: Yes    Comment: daily   Drug use: No   Sexual activity: Not Currently  Other Topics Concern   Not on file  Social History Narrative   Not on file   Social Determinants of Health   Financial Resource Strain: Not on file  Food Insecurity: Not on file  Transportation Needs: Not on file  Physical Activity: Not on file  Stress: Not on file  Social Connections: Not on file   Past Surgical History:  Procedure Laterality Date   ANKLE FRACTURE SURGERY Bilateral    CARPAL TUNNEL RELEASE  Bilateral    CHOLECYSTECTOMY     COLONOSCOPY  11/2016   multiple   ESOPHAGOGASTRODUODENOSCOPY  11/2016   ESOPHAGOGASTRODUODENOSCOPY N/A 02/01/2017   Procedure: ESOPHAGOGASTRODUODENOSCOPY (EGD);  Surgeon: Iva Boop, MD;  Location: [facility] ENDOSCOPY;  Service: Endoscopy;  Laterality: N/A;   ESOPHAGOGASTRODUODENOSCOPY (EGD) WITH PROPOFOL N/A 02/04/2017   Procedure: ESOPHAGOGASTRODUODENOSCOPY (EGD) WITH PROPOFOL;  Surgeon: Meryl Dare, MD;  Location: [facility] ENDOSCOPY;  Service: Endoscopy;  Laterality: N/A;   LAPAROSCOPIC GASTRIC SLEEVE RESECTION  2011   ULNAR  NERVE REPAIR Bilateral    Past Medical History:  Diagnosis Date   Alcohol abuse    Asthma    Esophagitis    GERD (gastroesophageal reflux disease)    Headache    Hearing loss    Hypertension    Multiple sclerosis (HCC)    Pancreatitis    Vision abnormalities    BP 128/87   Pulse 86   Temp 97.8 F (36.6 C)   Ht 5\' 5"  (1.651 m)   Wt 177 lb (80.3 kg)   LMP 08/13/2014 Comment: very few periods  SpO2 98%   BMI 29.45 kg/m   Opioid Risk Score:   Fall Risk Score:  `1  Depression screen PHQ 2/9  Depression screen [facility] 2/9 06/06/2021 02/24/2021 08/24/2020 02/09/2020 06/29/2019  Decreased Interest 1 1 1 3 3   Down, Depressed, Hopeless 1 1 1 3 3   PHQ - 2 Score 2 2 2 6 6   Altered sleeping - - - - 3  Tired, decreased energy - - - - 3  Change in appetite - - - - 3  Feeling bad or failure about yourself  - - - - 3  Trouble concentrating - - - - 3  Moving slowly or fidgety/restless - - - - 3  Suicidal thoughts - - - - 3  PHQ-9 Score - - - - 27  Difficult doing work/chores - - - - Extremely dIfficult  Some recent data might be hidden    Review of Systems  Constitutional: Negative.   Eyes: Negative.   Respiratory: Negative.    Endocrine: Negative.   Genitourinary: Negative.   Musculoskeletal:  Positive for neck pain.       Left arm  Allergic/Immunologic: Negative.       Objective:   Physical Exam Gen: no distress, normal  appearing HEENT: oral mucosa pink and moist, NCAT Cardio: Reg rate Chest: normal effort, normal rate of breathing Abd: soft, non-distended Ext: no edema Psych: pleasant, normal affect Ext: bilateral lower extremity edema Skin: bruising left arm Neuro/Musculoskeletal: Able to walk in office without falls with RW.  Remains sitting most of this session in contrast to last session. Able to bend down and touch her toes with FROM. Diffuse tenderness along spine with gentle palpation. Extension and oblique extension reproduce pain in lumbar spine. 5/5 strength in bilateral arms. 4-/5 in bilateral legs due to diffuse weakness. No focal motor or sensory deficits. + Slump test in bilateral lower extremities.   Has increased swelling at left [facility] joint with increased tenderness to palpation compared to opposite side.  Range of motion of neck limited to 40 degrees bilateral, tingling in neck reproduced with neck flexion.  Psych: pleasant, normal affect, attention is significantly improved from last visit.        Assessment & Plan:  KHYLER URDA is a 60 y.o. female with history of alcohol abuse, multiple sclerosis and frequent falls, presenting for follow-up for her diffuse chronic pain and depression., cervical and lumbar spine pain, compression fracture.    1) Poor energy: The Ritalin has really helped with her attention and energy and has helped her to be more active. Her energy is tremendously improved and she no longer requires the Ritalin which she has stopped taking on her own.    2) Depression: Continue Prozac and Effexor for depression.She is experiencing worsening anhedonia and excessive sleep. Increase Prozac to 40mg .   -Has become more severe. Add Amitriptyline 10mg  HS.    Keep a daily log of goals  and check them off every day.   Maintain social connections in your life. Continue exercising with your neighbors.    Walk at least 15 minutes every day, preferably outside.    Look  for local organizations and programs that can add meaning and friendship to your life.    Make an effort to do activities that you enjoy, such as going to the [employer], shopping, [employer] (cost is an issue here), consider return to work in a sedentary job.      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  -I have personally reviewed Lumbar MRI which shows mild-moderate stenosis. On 1/21 she had a right sided L4-L5 epidural steroid injection with Dr. Wynn Banker with great improvement in symptoms.  Will refer for repeat injection.  -Discussed Meloxicam as an option to consider in the future.    4) Bilateral greater trochanteric pain syndrome: Continue HEP. Educated regarding etiology and treatment of condition.   5) Impaired mobility and ADLs secondary to Multiple Sclerosis, Major Depression Disorder, L4-L5 lumbar stenosis: Patient's overall mobility and mental health are have regressed a little since last visit. She has been trying to obtain disability for financial support and I provided her with the address and phone number of the social security office and advised her that I am happy to help fill out any paperwork she needs. That being said, I advised her that returning to work on a part-time basis may be very good for her, improving her quality of life, physical function, giving her purpose, and improving her financial situation. She is agreeable to this and feels that she may be able to return to work and will look for jobs that interest her and that will not be too strenuous for her (avoiding lifting heavy weights or climbing stairs). She is still nervous about falling at work. -Encouraged to continue looking for work.    6) Left CMC arthritis: Prescribed diclofenac gel and advised that patient may apply up to 4 times per day. If this does not provide relief asked that patient call our office and I can order an XR and provide a referral for fitting for a splint. If this  does not provide relief, we can consider corticosteroid injection.   7) Alcohol abuse: She drinks at least 3 alcoholic beverages per day. She would like to quit. Discussed some of the risks of alcohol use. Made goal to stop alcohol use given compression fracture. Recommended replacing third drink with Ro[place]ois tea.  -Discussed option of support groups or disulfiram.  -prescribed disulfiram  8) Falls: related to alcohol use. If we can get this under control, the falls will decrease.   9) Disrupted circadian rhythm: Sometimes sleeps at 5am and sleeps during the day. Discussed sleep aides.   10) Cervical myofascial pain syndrome: -Repeat trigger point injections next visit.  11) Cervical spinal stenosis: -Continue Meloxicam, taking as needed.  -Keep up the turmeric.  -cervical MRI ordered.  -Discussed current symptoms of pain and history of pain.  -Discussed following foods that may reduce pain: 1) Ginger (especially studied for arthritis)- reduce leukotriene production to decrease inflammation 2) Blueberries- high in phytonutrients that decrease inflammation 3) Salmon- marine omega-3s reduce joint swelling and pain 4) Pumpkin seeds- reduce inflammation 5) dark chocolate- reduces inflammation 6) turmeric- reduces inflammation 7) tart cherries - reduce pain and stiffness 8) extra virgin olive oil - its compound olecanthal helps to block prostaglandins  9) chili Hubbard- can be eaten or applied topically via capsaicin 10)  mint- helpful for headache, muscle aches, joint pain, and itching 11) garlic- reduces inflammation  Link to further information on diet for chronic pain: [email or web address]   Link to further information on diet for chronic pain: [email or web address]     All questions were encouraged and answered. Follow up with me in mid-April.    12) Compression fracture: -a little but of goal cheese and green leafy vegetables every day.  -daily 10 minute walk outside.  -can continue oxycodone for very severe pain as prescribed by NSGY. If pain is moderate can consider restarting meloxicam -discussed risks of femur fracture -discussed natural healing process  13) Headaches: -waking up every day with headaches .Foods to alleviate migraine:  1) dark leafy greens 2) avocado 3) tuna 4) samon and mackerel 5) beans and legumes Supplements that can be helpful: feverfew, B12, and magnesium  Foods to avoid in migraine: 1) Excessive (or irregular timing) coffee 2) red wine 3) aged cheeses 4) chocolate 5) citrus fruits 6) aspartame and other artifical sweeteners 7) yeast 8) MSG (in processed foods) 9) processed and cured meats 10) nuts and certain seeds 11) chicken livers and other organ meats 12) dairy products like buttermilk, sour cream, and yogurt 13) dried fruits like dates, figs, and raisins 14) garlic 15) onions 16) potato chips 17) pickled foods like olives and sauerkraut 18) some fresh fruits like ripe banana, papaya, red plums, raspberries, kiwi, pineapple 19) tomato-based products  Recommend to keep a migraine diary: rate daily the severity of your headache (1-10) and what foods you eat that day to help determine patterns.    -bilateral occipital nerve blocks next visit.  -refilled amitripytline

## 2021-06-12 ENCOUNTER — Other Ambulatory Visit: Payer: Self-pay | Admitting: Medical

## 2021-06-12 ENCOUNTER — Other Ambulatory Visit: Payer: Self-pay

## 2021-06-12 MED ORDER — BACLOFEN 10 MG PO TABS: 10.0000 mg | ORAL_TABLET | Freq: Three times a day (TID) | ORAL | 3 refills | Status: DC

## 2021-06-13 ENCOUNTER — Ambulatory Visit: Payer: Federal, State, Local not specified - PPO | Admitting: Medical

## 2021-07-11 ENCOUNTER — Other Ambulatory Visit: Payer: Self-pay | Admitting: *Deleted

## 2021-07-12 MED ORDER — VENLAFAXINE HCL ER 37.5 MG PO CP24: 37.5000 mg | ORAL_CAPSULE | Freq: Every day | ORAL | 1 refills | Status: DC

## 2021-07-15 ENCOUNTER — Other Ambulatory Visit: Payer: Self-pay | Admitting: Medical

## 2021-07-15 ENCOUNTER — Other Ambulatory Visit: Payer: Self-pay | Admitting: Neurology

## 2021-07-21 ENCOUNTER — Other Ambulatory Visit: Payer: Self-pay | Admitting: Neurology

## 2021-07-22 ENCOUNTER — Ambulatory Visit
Admission: RE | Admit: 2021-07-22 | Discharge: 2021-07-22 | Disposition: A | Discharge status: Home / Self Care | Payer: Federal, State, Local not specified - PPO | Source: Ambulatory Visit | Attending: Physical Medicine and Rehabilitation | Admitting: Physical Medicine and Rehabilitation

## 2021-07-22 ENCOUNTER — Other Ambulatory Visit: Payer: Self-pay

## 2021-07-22 DIAGNOSIS — M4802 Spinal stenosis, cervical region: Secondary | ICD-10-CM

## 2021-07-22 DIAGNOSIS — M542 Cervicalgia: Secondary | ICD-10-CM | POA: Diagnosis not present

## 2021-07-24 ENCOUNTER — Encounter: Payer: Federal, State, Local not specified - PPO | Admitting: Physical Medicine and Rehabilitation

## 2021-07-24 ENCOUNTER — Other Ambulatory Visit: Payer: Self-pay

## 2021-08-29 ENCOUNTER — Ambulatory Visit: Payer: Federal, State, Local not specified - PPO | Admitting: Neurology

## 2021-08-29 ENCOUNTER — Encounter: Payer: Self-pay | Admitting: Neurology

## 2021-09-01 ENCOUNTER — Other Ambulatory Visit: Payer: Self-pay | Admitting: Medical

## 2021-09-25 ENCOUNTER — Other Ambulatory Visit: Payer: Self-pay | Admitting: Physical Medicine and Rehabilitation

## 2021-09-25 ENCOUNTER — Ambulatory Visit: Payer: Federal, State, Local not specified - PPO | Admitting: Physical Medicine and Rehabilitation

## 2021-10-05 ENCOUNTER — Other Ambulatory Visit: Payer: Self-pay | Admitting: Physical Medicine and Rehabilitation

## 2021-10-13 ENCOUNTER — Other Ambulatory Visit: Payer: Self-pay | Admitting: Medical

## 2021-10-16 ENCOUNTER — Telehealth: Payer: Self-pay | Admitting: *Deleted

## 2021-10-16 NOTE — Telephone Encounter (Signed)
LVM for pt asking she call office back to schedule appt. She needs appt before next Ocrevus infusion which she currently has scheduled for 11/13/21.   She is past due for appt since she was last seen 06/01/20. Dr. Epimenio Foot wants to see her at least every 6 months. Looks like she no showed 11/30/20 and 08/29/21.

## 2021-10-18 NOTE — Telephone Encounter (Signed)
Liane asked me to call the patient again to get her in before her infusion date, but there was no answer. I left a voice mail. ?

## 2021-10-19 ENCOUNTER — Other Ambulatory Visit: Payer: Self-pay | Admitting: Physical Medicine and Rehabilitation

## 2021-10-23 ENCOUNTER — Encounter: Payer: Self-pay | Admitting: Family Medicine

## 2021-10-23 ENCOUNTER — Ambulatory Visit: Payer: Federal, State, Local not specified - PPO | Admitting: Family Medicine

## 2021-10-23 VITALS — BP 180/114 | HR 71 | Ht 63.0 in | Wt 167.0 lb

## 2021-10-23 DIAGNOSIS — G8929 Other chronic pain: Secondary | ICD-10-CM

## 2021-10-23 DIAGNOSIS — Z79899 Other long term (current) drug therapy: Secondary | ICD-10-CM | POA: Diagnosis not present

## 2021-10-23 DIAGNOSIS — G35 Multiple sclerosis: Secondary | ICD-10-CM

## 2021-10-23 DIAGNOSIS — R5383 Other fatigue: Secondary | ICD-10-CM | POA: Diagnosis not present

## 2021-10-23 DIAGNOSIS — R3915 Urgency of urination: Secondary | ICD-10-CM | POA: Diagnosis not present

## 2021-10-23 DIAGNOSIS — I10 Essential (primary) hypertension: Secondary | ICD-10-CM

## 2021-10-23 DIAGNOSIS — F1011 Alcohol abuse, in remission: Secondary | ICD-10-CM

## 2021-10-23 NOTE — Patient Instructions (Signed)
Below is our plan: ? ?We will continue Ocrevus infusions. Next infusion 11/13/2021. Continue oxybutynin 5mg  1-2 times daily. Hold modafinil until you know that BP is well managed. This medication can make your BP elevated.  ? ?Please make sure you are staying well hydrated. I recommend 50-60 ounces daily. Well balanced diet and regular exercise encouraged. Consistent sleep schedule with 6-8 hours recommended.  ? ?Please continue follow up with care team as directed.  ? ?Follow up with Dr Felecia Shelling in 6 months  ? ?You may receive a survey regarding today's visit. I encourage you to leave honest feed back as I do use this information to improve patient care. Thank you for seeing me today!  ? ? ?

## 2021-10-23 NOTE — Progress Notes (Signed)
PATIENT: Catherine Hubbard DOB: [birth year]  REASON FOR VISIT: follow up HISTORY FROM: patient  Chief Complaint  Patient presents with   Follow-up    Pt alone, rm 10. Pt states overall stable. DMT: Ocrevus infusion next dose due 3/27.      HISTORY OF PRESENT ILLNESS: 10/23/21 ALL:  Catherine Hubbard is a 61 y.o. female here today for follow up for RRMS. Last seen 05/2020. She continues Ocrevus infusions. Last infusion was 04/2021. Next infusion scheduled 11/13/2021. Last MRI brain 10/2018 stable from 2018. MRI cervical spine was stable 07/2021.   She denies new or exacerbating symptoms. Gait is fairly stable. She denies falls. She is not using an assistive device, today, but has one at home if needed. Strength waxes and wanes. Lower extremities are weaker, left > right. She takes Baclofen up to three times daily for spasms.   Mood is stable. She is taking venlafaxine, amitriptyline (reports not taking every day) and fluoxetine managed by PCP/pain management. Sleep waxes and wanes. She reports being in a table pool league and plays on Sunday's. She has a harder time sleeping after she has played pool. She continues modafinil 200mg  daily, PRN. Last filled for 30 tablets 02/04/2021. She continues oxybutynin 5mg , prescribed BID but usually takes once daily, for urinary frequency and feel that it helps.   She continues to have constant pain. Hips and low back are worst areas. She is followed by pain management, Dr Ranell Patrick.   BP is elevated, today. She contributes this to being out last night playing pool and not getting enough sleep. She endorses drinking fireball and beer while playing pool. She has not taken her BP meds today. She reports readings are usually less than 140/90. She is asymptomatic with the exception of a headache.   HISTORY: (copied from Dr Garth Bigness note on 12/02/2019)  Catherine Hubbard is a 61 y.o. woman with relapsing remitting MS.     Update 12/02/2019: She feels she  has done well on Ocrevus and denies any new exacerbations.   She is walking reasonably well not needing a cane and has no recent falls.   Strength is mildly weaker in her left leg but hands more symmetric.      She denies numbness.   Vision is doing well.    She feels bladder function is unchanged with urinary urgency and incontinence.     Last year, she had a fall and developed a left parietal lobe hematoma.     She reports she is rarely drinking ETOH now.     Update 10/28/2018: Yesterday, she went to the kitchen after getting up.  While walking back, she was veering and felt unsteady.   She went back to bed.  A little later she got up and while walking back from her shower, she had a rotational vertigo and she fell.     She hit her head and went to the ED.   There was no LOC before or with the fall.  CT showed a left parietal hematoma and stable volume loss and white matter changes form MS.     She has shooting pains in her face and numbness in her hands and feet.    No change in bladder and occasional urge incontinence.    She has had stomach pain since an EGD and also had a CT abdomen showing chronic DJD, chronic L1 compression deformity and old pubic rami fracture.  No free fluid or acute change.   She  has had gastric sleeve surgery.      In January, copper was low normal at 72.      Update 08/26/2018: Her MS seems mostly stable.   She just had her Ocrevus infusion yesterday.   She tolerated it well and feels better since the infusion.    A few days ago, she fell reaching for her Ipad ona table and hit her forehead requiring stitches.   She still reports pain there.   No LOC.    She has not had any recent episode of syncope (last one summer 2019).   Her gait has done worse the last year, requiring her cane more.  She has occasional stumbles and rare falls.   Balance is off.   Her legs seem mildly weak, maybe right mildly worse.   She has numbness in her legs.    She notes more edema in her legs.    She is trying to cut back on salt and she sometimes wears compression stockings.  She has urinary frequency with urge incontinence.   Some fecal incontinence.  She has a recent UTI.      Update 03/27/2018: She had syncope 2 months ago.  She was looking out the window and then woke up on the floor with her boss looking over her.    She reportedly hit her head on her way down.   She is not sure she lost consciousness but others toild her she did for just a few seconds and woke back up as soon as someone touched her head.    She also tripped 03/02/2018 and broke 2 ribs on her right when she slipped on 'goose crap'.    She was taken to [facility] and was admitted.   Of note, she had GI issues and was dehydrated and also had a UTI, hyponatemia, hypokalemia and mild hypoglycemia.     She is trying to do better with fluids.      Echo showed LVH and grade 1 diastolic dysfunction.   She feels her MS is mostly stable.   She is on Ocrevus (last one June, next one December 2019).   She stopped many of the symptomatic medications   Gait is ok.   She wears flats as low heels may have increased fall risk.  She reports fatigue but feels better than she did a couple months ago.  She is sleeping ok most nights.   She notes urinary urgency but not bad enough to take a medication for it.     Update 01/20/2018: She has been on ocrelizumab and just had her second dose of the medication.   She has tolerated it well and has not had any exacerbations.    She feels stable for the most part but has had more falls, usually due to stumbling or tripping.   Gait does not seem that different to her.    She denies any change in strength or sensation.  She is noting urinary frequency with some burning and is concerned about a urinary tract infection.  She has had these in the past.   She gets LBP helped by muscle relaxants.  The pain is midline and radiates to the proximal legs.  She also has pain in her shoulders and arms.   She notes  spasms in hr neck.        She has esophagitis and is supposed to have a stretch procedure soon.      She also has a h/o gastric  bypass   Update 07/22/2017:   She had her first dose of ocrelizumab in November. She tolerated it well and there were no complications. Her next infusion will be at the end of April. For the most part, she feels the MS has been stable. However, she did note some word finding difficulties last week. This is better this week.    She felt mildly stronger since the infusion.   She feels off balanced, especially if she turns fast or someone bumps into her.   She needs to hold on in the shower.  Her right leg is weaker than her left and sometimes has a foot drop, esp if walking longer distances.     She is doing Geophysicist/field seismologist yoga.   She notes spasticity in legs at night, helped by valium.     Late last month,  she noted a few days where she had trouble coming up with the right word.   She feels she is doing better than last week ans almost back to baseline.    Her bladder is about the same.     Vision is mostly stable.   She lost her prescription glasses and uses readers to read.     She notes pain, mostly in the mid back.   Yoga has not helped the pain much.    She notes sometimes bruising more easily, especially after scratching.    Mood is doing ok, helped by duloxetine and Wellbutrin.   Drinking is better.         From 10/09/2016:   MS:   She is on Rebif and tolerates it well.    However, her last injection was 08/17/2016 and she has not yet gotten refill.  She denies any definite exacerbations but she feels balance, footdrop and writing are worse.    Gait/strength/sensation:   She notes a right foot drop and over last year the left leg is also bothering her more.   She reports poor balance .   She does not need a cane.  She has spasticity in both legs, worse on the right and she sometimes gets spasms that are worse at night.      Sometimes she feels like she is being  pushed to the right as she walks.   She has tingling in her hands and face.  Baclofen has helped spasticity some.   Cyclobenzaprine has not helped spasms.     Bladder:   She reports urinary frequency and occasional incontinence.   She feels bladder function is worse with recent bladder infection.   stable.,  She was placed on Cephalexin and then switched to doxycycline.   She felt she did better at first on cephalexin but then did worse again.   Cx and Sensitivity shows E. Coli resistance to cephalosporines (also to Bactrim and Cipro) and she is sensitive to tetracycline and nitrofurantoin and gentamicin and meropenem.        Vision:swallow:   She feels this is stable.   She has diplopia laying on either side but not when sitting up.   Also some she reads a while or when very tired.      Dysphagia:   She notes dysphagia, especially with 'sticky' pills.   With EGD, she had some capsules stucjk in esophagus and some med's were discontinued.    Strictures are being stretched.     Mood/EtOH/Cognition:   Cymbalta was stopped and she was placed on Wellbutrin and Prozac but is doing worse.  The switch was made due to difficulty swallowing.    She still sometimes feels moderately depressed.     She has some apathy.   She drinks less alcohol than last year   She has mild difficulty with cognition, mostly occasional difficulty with short-term memory and verbal fluency (often gets syllables reversed).  She has trouble completing complicated tasks.  .   Fatigue:   She has fatigue and takes Provigil but it was stopped.  She was tried on Ritalin or Adderall once but she did not like the way that she felt when she took it.     Sleep:   She is sleeping poorly due to more pain.   She has some RLS but has not taken anything in maonths for it.   She notes difficulty falling asleep more than with sleep maintenance.    MS History:   She was diagnosed with multiple sclerosis in 2009. Before that time, she had noted some  gait ataxia but had not had any imaging studies. In 2009, due to progressive hearing loss, she had an MRI of the brain and  then had a lumbar puncture that also confirmed the diagnosis.  I have some of her early MRI reports there are several foci noted in the cervical and thoracic spinal cord in the brain, there were multiple hyperintense foci, mostly in the periventricular deep white matter many of these were contrast enhancing on 07/26/2008. There was another enhancing lesion adjacent to C2 in the spinal cord.  Initially, she was placed on Avonex. She did not feel good and she stopped. She then tried Copaxone but also stopped after a while. She moved here in 2014 and saw Dr. Shelia Media at [facility]. She was placed on Rebif. She tolerates Rebif well.      MRI reports from 12/04/2012  showed multiple foci in the spinal cord at C2, C3-C4, and C4-C5. There were also foci at T4, T7, T9 and T10. The MRI of the brain showed 2 nonenhancing foci not present from studies in 2011. She reports having an MRI at [facility] 2015 but we do not have those films.     REVIEW OF SYSTEMS: Out of a complete 14 system review of symptoms, the patient complains only of the following symptoms, chronic pain, falls, imbalance, abnormal gait, anxiety, headaches and all other reviewed systems are negative.   ALLERGIES: Allergies  Allergen Reactions   Ibuprofen Other (See Comments)    Cannot take due to gastric bypass   Nsaids Other (See Comments)    Gastric bypass    HOME MEDICATIONS: Outpatient Medications Prior to Visit  Medication Sig Dispense Refill   albuterol (PROVENTIL) (2.5 MG/3ML) 0.083% nebulizer solution Take 3 mLs (2.5 mg total) by nebulization every 6 (six) hours as needed for wheezing or shortness of breath. 150 mL 1   albuterol (VENTOLIN HFA) 108 (90 Base) MCG/ACT inhaler TAKE 2 PUFFS BY MOUTH EVERY 6 HOURS AS NEEDED FOR WHEEZE OR SHORTNESS OF BREATH 54 each 2   amitriptyline (ELAVIL) 10 MG tablet TAKE 1  TABLET BY MOUTH EVERYDAY AT BEDTIME 90 tablet 1   baclofen (LIORESAL) 10 MG tablet Take 1 tablet (10 mg total) by mouth 3 (three) times daily. 90 tablet 3   budesonide-formoterol (SYMBICORT) 80-4.5 MCG/ACT inhaler TAKE 2 PUFFS BY MOUTH TWICE A DAY 10.2 each 3   Cholecalciferol (D2000 ULTRA STRENGTH) 50 MCG (2000 UT) CAPS Take 1 capsule by mouth daily.      COVID-19 mRNA bivalent vaccine,  Pfizer, injection Inject into the muscle. 0.3 mL 0   diclofenac Sodium (VOLTAREN) 1 % GEL APPLY 4 G TOPICALLY 4 (FOUR) TIMES DAILY. (Patient taking differently: Apply 4 g topically 4 (four) times daily. PRN) 400 g 1   FLUoxetine (PROZAC) 40 MG capsule TAKE 1 CAPSULE BY MOUTH EVERY DAY 30 capsule 3   fluticasone (FLONASE) 50 MCG/ACT nasal spray SPRAY 2 SPRAYS INTO EACH NOSTRIL EVERY DAY 16 mL 12   gabapentin (NEURONTIN) 100 MG capsule TAKE 1 CAPSULE (100 MG TOTAL) BY MOUTH 3 TIMES A DAY 90 capsule 0   influenza vac split quadrivalent PF (FLUARIX) 0.5 ML injection Inject into the muscle. 0.5 mL 0   meloxicam (MOBIC) 7.5 MG tablet Take 1 tablet (7.5 mg total) by mouth daily. 30 tablet 1   modafinil (PROVIGIL) 200 MG tablet TAKE 1 TABLET BY MOUTH EVERY DAY 30 tablet 3   Multiple Vitamin (MULTIVITAMIN WITH MINERALS) TABS tablet Take 1 tablet by mouth daily.     OCREVUS 300 MG/10ML injection Inject 20 mLs (600 mg total) into the vein every 6 (six) months. 20 mL 1   olmesartan (BENICAR) 5 MG tablet TAKE 2 TABLETS BY MOUTH EVERY DAY 60 tablet 5   omeprazole (PRILOSEC) 20 MG capsule Take by mouth.     oxybutynin (DITROPAN) 5 MG tablet TAKE 1 TABLET BY MOUTH TWICE A DAY 60 tablet 1   venlafaxine XR (EFFEXOR-XR) 37.5 MG 24 hr capsule TAKE 1 CAPSULE BY MOUTH DAILY WITH BREAKFAST. PATIENT NEEDS APPOINTMENT BEFORE ANY FURTHER REFILLS 90 capsule 1   Disulfiram 500 MG TABS TAKE 1 TABLET (500 MG TOTAL) BY MOUTH DAILY. 30 tablet 0   FLUoxetine (PROZAC) 20 MG capsule Take 1 capsule by mouth daily.     No facility-administered  medications prior to visit.    PAST MEDICAL HISTORY: Past Medical History:  Diagnosis Date   Alcohol abuse    Asthma    Esophagitis    GERD (gastroesophageal reflux disease)    Headache    Hearing loss    Hypertension    Multiple sclerosis ([place])    Pancreatitis    Vision abnormalities     PAST SURGICAL HISTORY: Past Surgical History:  Procedure Laterality Date   ANKLE FRACTURE SURGERY Bilateral    CARPAL TUNNEL RELEASE Bilateral    CHOLECYSTECTOMY     COLONOSCOPY  11/2016   multiple   ESOPHAGOGASTRODUODENOSCOPY  11/2016   ESOPHAGOGASTRODUODENOSCOPY N/A 02/01/2017   Procedure: ESOPHAGOGASTRODUODENOSCOPY (EGD);  Surgeon: Gatha Mayer, MD;  Location: [facility] ENDOSCOPY;  Service: Endoscopy;  Laterality: N/A;   ESOPHAGOGASTRODUODENOSCOPY (EGD) WITH PROPOFOL N/A 02/04/2017   Procedure: ESOPHAGOGASTRODUODENOSCOPY (EGD) WITH PROPOFOL;  Surgeon: Ladene Artist, MD;  Location: [facility] ENDOSCOPY;  Service: Endoscopy;  Laterality: N/A;   LAPAROSCOPIC GASTRIC SLEEVE RESECTION  2011   ULNAR NERVE REPAIR Bilateral     FAMILY HISTORY: Family History  Problem Relation Age of Onset   Cancer Mother    Stroke Mother    Cancer Father     SOCIAL HISTORY: Social History   Socioeconomic History   Marital status: Married    Spouse name: Not on file   Number of children: Not on file   Years of education: Not on file   Highest education level: Not on file  Occupational History   Not on file  Tobacco Use   Smoking status: Never   Smokeless tobacco: Never   Tobacco comments:    second hand smoker when young. Heavy exposure.  Vaping Use  Vaping Use: Never used  Substance and Sexual Activity   Alcohol use: Yes    Comment: daily   Drug use: No   Sexual activity: Not Currently  Other Topics Concern   Not on file  Social History Narrative   Not on file   Social Determinants of Health   Financial Resource Strain: Not on file  Food Insecurity: Not on file  Transportation Needs: Not  on file  Physical Activity: Not on file  Stress: Not on file  Social Connections: Not on file  Intimate Partner Violence: Not on file      PHYSICAL EXAM  Vitals:   10/23/21 0855 10/23/21 0912  BP: (!) 191/114 (!) 180/114  Pulse: 71   Weight: 167 lb (75.8 kg)   Height: 5\' 3"  (1.6 m)     Body mass index is 29.58 kg/m.  Generalized: Well developed, in no acute distress  Cardiology: normal rate and rhythm, no murmur noted Respiratory: clear to auscultation bilaterally  Neurological examination  Mentation: Alert oriented to time, place, history taking. Follows all commands speech and language fluent Cranial nerve II-XII: Pupils were equal round reactive to light. Extraocular movements were full, visual field were full on confrontational test. Facial sensation and strength were normal. Head turning and shoulder shrug  were normal and symmetric. Motor: The motor testing reveals 5 over 5 strength of all 4 extremities. Good symmetric motor tone is noted throughout.  Sensory: Sensory testing is intact to soft touch on all 4 extremities. No evidence of extinction is noted.  Gait and station: Able to push slowly to standing position. Gait is slightly wide and arthritic. Tandem very unsteady, no assistive device   DIAGNOSTIC DATA (LABS, IMAGING, TESTING) - I reviewed patient records, labs, notes, testing and imaging myself where available.  No flowsheet data found.   Lab Results  Component Value Date   WBC 8.8 05/24/2021   HGB 11.3 (L) 05/24/2021   HCT 33.9 (L) 05/24/2021   MCV 90.4 05/24/2021   PLT 362 05/24/2021      Component Value Date/Time   NA 132 (L) 05/24/2021 1505   NA 140 06/02/2019 1549   K 3.8 05/24/2021 1505   CL 98 05/24/2021 1505   CO2 23 05/24/2021 1505   GLUCOSE 114 (H) 05/24/2021 1505   BUN 14 05/24/2021 1505   BUN 15 06/02/2019 1549   CREATININE 0.63 05/24/2021 1505   CREATININE 0.62 11/27/2018 1505   CALCIUM 9.4 05/24/2021 1505   PROT 7.4 05/24/2021  1505   PROT 6.8 06/02/2019 1549   ALBUMIN 4.1 05/24/2021 1505   ALBUMIN 4.3 06/02/2019 1549   AST 25 05/24/2021 1505   ALT 13 05/24/2021 1505   ALKPHOS 91 05/24/2021 1505   BILITOT 1.5 (H) 05/24/2021 1505   BILITOT 0.5 06/02/2019 1549   GFRNONAA >60 05/24/2021 1505   GFRAA 100 06/02/2019 1549   Lab Results  Component Value Date   CHOL 152 02/18/2020   HDL 90.40 02/18/2020   LDLCALC 41 02/18/2020   TRIG 107.0 02/18/2020   CHOLHDL 2 02/18/2020   No results found for: HGBA1C Lab Results  Component Value Date   VITAMINB12 1,149 (H) 08/28/2018   Lab Results  Component Value Date   TSH 0.973 08/26/2018       ASSESSMENT AND PLAN 61 y.o. year old female  has a past medical history of Alcohol abuse, Asthma, Esophagitis, GERD (gastroesophageal reflux disease), Headache, Hearing loss, Hypertension, Multiple sclerosis (HCC), Pancreatitis, and Vision abnormalities. here with  ICD-10-CM   1. Relapsing remitting multiple sclerosis (HCC)  G35 CBC with Differential/Platelets    IgG, IgA, IgM    2. High risk medication use  Z79.899     3. Urinary urgency  R39.15     4. Other fatigue  R53.83     5. Other chronic pain  G89.29     6. History of alcohol abuse  F10.11     7. Hypertension, unspecified type  I10        Catherine is doing fairly well today. She will continue Ocrevus every 6 months. We will update labs today. She will continue baclofen, modafinil and oxybutynin as prescribed. I have asked her to hold modafinil until BP readings are less than 140/90. She is asymptomatic with the exception of a headache. She was encouraged to check BP once she has taken BP medications at home, today. If reading remains elevated or if headache does not resolve she will contact PCP. Red flag warnings and when to [facility] emergency medical attention reviewed in office and education provided in AVS. Healthy lifestyle habits encouraged. She will continue close follow up with PCP and pain management  as directed. Follow up with Dr Felecia Shelling in 6 months. She verbalizes understanding and agreement with this plan.    Orders Placed This Encounter  Procedures   CBC with Differential/Platelets   IgG, IgA, IgM     No orders of the defined types were placed in this encounter.     I spent 30 minutes with the patient. 50% of this time was spent counseling and educating patient on plan of care and medications.     Debbora Presto, FNP-C 10/23/2021, 9:56 AM [facility] [address] [place],  [address] [phone]

## 2021-10-24 LAB — CBC WITH DIFFERENTIAL/PLATELET
Basophils Absolute: 0.1 10*3/uL (ref 0.0–0.2)
Basos: 2 %
EOS (ABSOLUTE): 0.3 10*3/uL (ref 0.0–0.4)
Eos: 5 %
Hematocrit: 35.3 % (ref 34.0–46.6)
Hemoglobin: 11.3 g/dL (ref 11.1–15.9)
Immature Grans (Abs): 0 10*3/uL (ref 0.0–0.1)
Immature Granulocytes: 0 %
Lymphocytes Absolute: 1.3 10*3/uL (ref 0.7–3.1)
Lymphs: 20 %
MCH: 29 pg (ref 26.6–33.0)
MCHC: 32 g/dL (ref 31.5–35.7)
MCV: 91 fL (ref 79–97)
Monocytes Absolute: 0.9 10*3/uL (ref 0.1–0.9)
Monocytes: 13 %
Neutrophils Absolute: 3.9 10*3/uL (ref 1.4–7.0)
Neutrophils: 60 %
Platelets: 322 10*3/uL (ref 150–450)
RBC: 3.89 x10E6/uL (ref 3.77–5.28)
RDW: 14.4 % (ref 11.7–15.4)
WBC: 6.6 10*3/uL (ref 3.4–10.8)

## 2021-10-24 LAB — IGG, IGA, IGM
IgA/Immunoglobulin A, Serum: 303 mg/dL (ref 87–352)
IgG (Immunoglobin G), Serum: 987 mg/dL (ref 586–1602)
IgM (Immunoglobulin M), Srm: 188 mg/dL (ref 26–217)

## 2021-11-06 ENCOUNTER — Encounter: Payer: Federal, State, Local not specified - PPO | Admitting: Physical Medicine and Rehabilitation

## 2021-11-06 ENCOUNTER — Other Ambulatory Visit: Payer: Self-pay

## 2021-11-06 VITALS — BP 166/107 | HR 90 | Ht 63.0 in | Wt 167.0 lb

## 2021-11-06 DIAGNOSIS — M5481 Occipital neuralgia: Secondary | ICD-10-CM

## 2021-11-06 NOTE — Progress Notes (Signed)
Patient left- unable to receive procedure due to elevated blood pressure- did not take medication this morning ?

## 2021-11-13 DIAGNOSIS — G35 Multiple sclerosis: Secondary | ICD-10-CM | POA: Diagnosis not present

## 2021-11-29 DIAGNOSIS — M5414 Radiculopathy, thoracic region: Secondary | ICD-10-CM | POA: Diagnosis not present

## 2021-11-29 DIAGNOSIS — M791 Myalgia, unspecified site: Secondary | ICD-10-CM | POA: Diagnosis not present

## 2021-11-29 DIAGNOSIS — M9905 Segmental and somatic dysfunction of pelvic region: Secondary | ICD-10-CM | POA: Diagnosis not present

## 2021-11-29 DIAGNOSIS — M5417 Radiculopathy, lumbosacral region: Secondary | ICD-10-CM | POA: Diagnosis not present

## 2021-11-29 DIAGNOSIS — M9903 Segmental and somatic dysfunction of lumbar region: Secondary | ICD-10-CM | POA: Diagnosis not present

## 2021-11-29 DIAGNOSIS — M9901 Segmental and somatic dysfunction of cervical region: Secondary | ICD-10-CM | POA: Diagnosis not present

## 2021-11-29 DIAGNOSIS — M62830 Muscle spasm of back: Secondary | ICD-10-CM | POA: Diagnosis not present

## 2021-12-02 ENCOUNTER — Encounter (HOSPITAL_COMMUNITY): Payer: Self-pay | Admitting: Emergency Medicine

## 2021-12-02 ENCOUNTER — Emergency Department (HOSPITAL_COMMUNITY): Payer: Federal, State, Local not specified - PPO

## 2021-12-02 ENCOUNTER — Other Ambulatory Visit: Payer: Self-pay

## 2021-12-02 ENCOUNTER — Emergency Department (HOSPITAL_COMMUNITY)
Admission: EM | Admit: 2021-12-02 | Discharge: 2021-12-03 | Disposition: A | Discharge status: Home / Self Care | Payer: Federal, State, Local not specified - PPO | Attending: Emergency Medicine | Admitting: Emergency Medicine

## 2021-12-02 ENCOUNTER — Other Ambulatory Visit: Payer: Self-pay | Admitting: Medical

## 2021-12-02 DIAGNOSIS — R58 Hemorrhage, not elsewhere classified: Secondary | ICD-10-CM | POA: Diagnosis not present

## 2021-12-02 DIAGNOSIS — W01198A Fall on same level from slipping, tripping and stumbling with subsequent striking against other object, initial encounter: Secondary | ICD-10-CM | POA: Diagnosis not present

## 2021-12-02 DIAGNOSIS — Y990 Civilian activity done for income or pay: Secondary | ICD-10-CM | POA: Insufficient documentation

## 2021-12-02 DIAGNOSIS — R609 Edema, unspecified: Secondary | ICD-10-CM | POA: Diagnosis not present

## 2021-12-02 DIAGNOSIS — M25422 Effusion, left elbow: Secondary | ICD-10-CM | POA: Diagnosis not present

## 2021-12-02 DIAGNOSIS — M25552 Pain in left hip: Secondary | ICD-10-CM | POA: Insufficient documentation

## 2021-12-02 DIAGNOSIS — R600 Localized edema: Secondary | ICD-10-CM | POA: Diagnosis not present

## 2021-12-02 DIAGNOSIS — S5002XA Contusion of left elbow, initial encounter: Secondary | ICD-10-CM | POA: Diagnosis not present

## 2021-12-02 DIAGNOSIS — M25522 Pain in left elbow: Secondary | ICD-10-CM | POA: Diagnosis not present

## 2021-12-02 DIAGNOSIS — S0990XA Unspecified injury of head, initial encounter: Secondary | ICD-10-CM | POA: Diagnosis not present

## 2021-12-02 DIAGNOSIS — S0083XA Contusion of other part of head, initial encounter: Secondary | ICD-10-CM | POA: Diagnosis not present

## 2021-12-02 DIAGNOSIS — S0003XA Contusion of scalp, initial encounter: Secondary | ICD-10-CM | POA: Diagnosis not present

## 2021-12-02 DIAGNOSIS — M47812 Spondylosis without myelopathy or radiculopathy, cervical region: Secondary | ICD-10-CM | POA: Diagnosis not present

## 2021-12-02 DIAGNOSIS — W19XXXA Unspecified fall, initial encounter: Secondary | ICD-10-CM | POA: Diagnosis not present

## 2021-12-02 MED ORDER — FENTANYL CITRATE PF 50 MCG/ML IJ SOSY
50.0000 ug | PREFILLED_SYRINGE | Freq: Once | INTRAMUSCULAR | Status: AC
  Administered 2021-12-02: 50 ug via INTRAVENOUS
  Filled 2021-12-02: qty 1

## 2021-12-02 MED ORDER — OXYCODONE-ACETAMINOPHEN 5-325 MG PO TABS: 1.0000 | ORAL_TABLET | Freq: Four times a day (QID) | ORAL | 0 refills | Status: DC | PRN

## 2021-12-02 MED ORDER — METOCLOPRAMIDE HCL 5 MG/ML IJ SOLN
10.0000 mg | Freq: Once | INTRAMUSCULAR | Status: AC
Start: 2021-12-02 — End: 2021-12-02
  Administered 2021-12-02: 10 mg via INTRAVENOUS
  Filled 2021-12-02: qty 2

## 2021-12-02 NOTE — ED Triage Notes (Addendum)
Pt to triage via [facility] from her job.  Pt tripped over curb while leaving work and hit head on pavement.  L forehead hematoma/abrasion.  L pupil sluggish and larger than R but husband reports glaucoma and recent cataract surgery. ? ?Denies LOC.  Denies nausea/vomiting.  Denies blurred vision.  No blood thinners.  Alert and oriented. ? ?Reports L elbow pain and headache, dizziness, and chronic lower back pain.   ? ?EMS- ?IV-20g L hand ?C-collar ?

## 2021-12-02 NOTE — Discharge Instructions (Addendum)
Follow-up with orthopedic surgery regarding your possible elbow fracture.  Recommend no weightbearing on your left arm for now.  Please call their clinic on Monday morning to request close follow-up appointment.  Recommend taking Tylenol and Motrin for pain control.  For breakthrough pain can take the prescribed Percocet.  Note this does contain some Tylenol.  This can make you drowsy should not be taken while driving or operating heavy machinery. ?

## 2021-12-03 ENCOUNTER — Telehealth: Payer: Self-pay | Admitting: Surgery

## 2021-12-03 ENCOUNTER — Telehealth: Payer: Self-pay

## 2021-12-03 ENCOUNTER — Telehealth (HOSPITAL_BASED_OUTPATIENT_CLINIC_OR_DEPARTMENT_OTHER): Payer: Self-pay | Admitting: Emergency Medicine

## 2021-12-03 MED ORDER — OXYCODONE-ACETAMINOPHEN 5-325 MG PO TABS: 1.0000 | ORAL_TABLET | Freq: Four times a day (QID) | ORAL | 0 refills | Status: DC | PRN

## 2021-12-03 MED ORDER — OXYCODONE-ACETAMINOPHEN 5-325 MG PO TABS: 1.0000 | ORAL_TABLET | Freq: Four times a day (QID) | ORAL | 0 refills | Status: AC | PRN

## 2021-12-03 NOTE — Progress Notes (Signed)
Orthopedic Tech Progress Note ?Patient Details:  ?Catherine Hubbard ?[birth year] ?[id number] ? ?Ortho Devices ?Type of Ortho Device: Arm sling, Post (long arm) splint ?Ortho Device/Splint Location: lue ?Ortho Device/Splint Interventions: Ordered, Application, Adjustment, Removal ?  ?Post Interventions ?Patient Tolerated: Well ?Instructions Provided: Care of device, Adjustment of device ? ?[name] ?12/03/2021, 1:44 AM ? ?

## 2021-12-03 NOTE — Telephone Encounter (Signed)
Note created to change pharmacy, see encounter documentation from yesterday ?

## 2021-12-03 NOTE — Telephone Encounter (Signed)
Husband called in to state that [facility] does not have percocet in the strength ordered. Messaged Dr Stevie Kern about changing it to another pharmacy. Explained tohusband that only provider seeing patient can change it.   Call back number  [facility] [phone].  ?

## 2021-12-03 NOTE — Telephone Encounter (Signed)
Encounter to change pharmacy

## 2021-12-03 NOTE — Telephone Encounter (Signed)
Dr Stevie Kern came in to work at [employer]. Called him for sending pain medication to pharmacy. He is going to send it soon. Let oncoming CM know and notified patient spouse.  ?

## 2021-12-04 DIAGNOSIS — M25522 Pain in left elbow: Secondary | ICD-10-CM | POA: Diagnosis not present

## 2021-12-04 NOTE — ED Provider Notes (Signed)
?[facility] EMERGENCY DEPARTMENT ?Provider Note ? ? ?CSN: [id number] ?Arrival date & time: 12/02/21  1805 ? ?  ? ?History ? ?Chief Complaint  ?Patient presents with  ? Fall  ? ? ?Catherine Hubbard is a 61 y.o. female.  Presents to the emergency room with concern for fall.  Patient tripped on a curb while leaving work, which she hit her head on pavement.  His LOC.  States that she is having pain in her left elbow and left hip.  No nausea or vomiting, states pain is currently moderate to severe, worse with movement.  Has been able to bear weight on her legs since the fall.  Not on blood thinner. ? ?HPI ? ?  ? ?Home Medications ?Prior to Admission medications   ?Medication Sig Start Date End Date Taking? Authorizing Provider  ?albuterol (PROVENTIL) (2.5 MG/3ML) 0.083% nebulizer solution Take 3 mLs (2.5 mg total) by nebulization every 6 (six) hours as needed for wheezing or shortness of breath. 11/26/19   Saguier, Ramon Dredge, PA-C  ?albuterol (VENTOLIN HFA) 108 (90 Base) MCG/ACT inhaler TAKE 2 PUFFS BY MOUTH EVERY 6 HOURS AS NEEDED FOR WHEEZE OR SHORTNESS OF BREATH 05/26/21   Saguier, Ramon Dredge, PA-C  ?amitriptyline (ELAVIL) 10 MG tablet TAKE 1 TABLET BY MOUTH EVERYDAY AT BEDTIME 10/20/21   Raulkar, Drema Pry, MD  ?baclofen (LIORESAL) 10 MG tablet Take 1 tablet (10 mg total) by mouth 3 (three) times daily. 06/12/21   Lomax, Amy, NP  ?budesonide-formoterol (SYMBICORT) 80-4.5 MCG/ACT inhaler TAKE 2 PUFFS BY MOUTH TWICE A DAY 06/12/21   Saguier, Ramon Dredge, PA-C  ?Cholecalciferol (D2000 ULTRA STRENGTH) 50 MCG (2000 UT) CAPS Take 1 capsule by mouth daily.     [provider]  ?COVID-19 mRNA bivalent vaccine, Pfizer, injection Inject into the muscle. 05/15/21   Judyann Munson, MD  ?diclofenac Sodium (VOLTAREN) 1 % GEL APPLY 4 G TOPICALLY 4 (FOUR) TIMES DAILY. ?Patient taking differently: Apply 4 g topically 4 (four) times daily. PRN 12/24/19   Raulkar, Drema Pry, MD  ?FLUoxetine (PROZAC) 40 MG capsule TAKE 1  CAPSULE BY MOUTH EVERY DAY 05/04/21   Raulkar, Drema Pry, MD  ?fluticasone (FLONASE) 50 MCG/ACT nasal spray SPRAY 2 SPRAYS INTO EACH NOSTRIL EVERY DAY 10/16/21   Saguier, Ramon Dredge, PA-C  ?gabapentin (NEURONTIN) 100 MG capsule TAKE 1 CAPSULE (100 MG TOTAL) BY MOUTH 3 TIMES A DAY 10/16/21   Saguier, Ramon Dredge, PA-C  ?influenza vac split quadrivalent PF (FLUARIX) 0.5 ML injection Inject into the muscle. 05/15/21   Judyann Munson, MD  ?meloxicam (MOBIC) 7.5 MG tablet Take 1 tablet (7.5 mg total) by mouth daily. 02/24/21   Raulkar, Drema Pry, MD  ?modafinil (PROVIGIL) 200 MG tablet TAKE 1 TABLET BY MOUTH EVERY DAY 11/02/20   Sater, Pearletha Furl, MD  ?Multiple Vitamin (MULTIVITAMIN WITH MINERALS) TABS tablet Take 1 tablet by mouth daily.    [provider]  ?OCREVUS 300 MG/10ML injection Inject 20 mLs (600 mg total) into the vein every 6 (six) months. 10/05/19   Lomax, Amy, NP  ?olmesartan (BENICAR) 5 MG tablet TAKE 2 TABLETS BY MOUTH EVERY DAY 04/03/21   Saguier, Ramon Dredge, PA-C  ?omeprazole (PRILOSEC) 20 MG capsule Take by mouth. 07/09/20   [provider]  ?oxybutynin (DITROPAN) 5 MG tablet TAKE 1 TABLET BY MOUTH TWICE A DAY 07/17/21   Sater, Pearletha Furl, MD  ?oxyCODONE-acetaminophen (PERCOCET/ROXICET) 5-325 MG tablet Take 1 tablet by mouth every 6 (six) hours as needed for up to 3 days for severe pain.  12/03/21 12/06/21  Milagros Loll, MD  ?venlafaxine XR (EFFEXOR-XR) 37.5 MG 24 hr capsule TAKE 1 CAPSULE BY MOUTH DAILY WITH BREAKFAST. PATIENT NEEDS APPOINTMENT BEFORE ANY FURTHER REFILLS 10/20/21   Raulkar, Drema Pry, MD  ?   ? ?Allergies    ?Ibuprofen and Nsaids   ? ?Review of Systems   ?Review of Systems  ?Constitutional:  Negative for chills and fever.  ?HENT:  Negative for ear pain and sore throat.   ?Eyes:  Negative for pain and visual disturbance.  ?Respiratory:  Negative for cough and shortness of breath.   ?Cardiovascular:  Negative for chest pain and palpitations.  ?Gastrointestinal:  Negative for abdominal pain  and vomiting.  ?Genitourinary:  Negative for dysuria and hematuria.  ?Musculoskeletal:  Positive for arthralgias. Negative for back pain.  ?Skin:  Negative for color change and rash.  ?Neurological:  Negative for seizures and syncope.  ?All other systems reviewed and are negative. ? ?Physical Exam ?Updated Vital Signs ?BP 128/74 (BP Location: Right Arm)   Pulse 88   Temp 98.4 ?F (36.9 ?C) (Oral)   Resp 18   LMP 08/13/2014 Comment: very few periods  SpO2 96%  ?Physical Exam ?Vitals and nursing note reviewed.  ?Constitutional:   ?   General: She is not in acute distress. ?   Appearance: She is well-developed.  ?HENT:  ?   Head: Normocephalic.  ?   Comments: Mild forehead hematoma/abrasion. ?Eyes:  ?   Conjunctiva/sclera: Conjunctivae normal.  ?Cardiovascular:  ?   Rate and Rhythm: Normal rate and regular rhythm.  ?   Heart sounds: No murmur heard. ?Pulmonary:  ?   Effort: Pulmonary effort is normal. No respiratory distress.  ?   Breath sounds: Normal breath sounds.  ?Abdominal:  ?   Palpations: Abdomen is soft.  ?   Tenderness: There is no abdominal tenderness.  ?Musculoskeletal:     ?   General: No swelling.  ?   Cervical back: Neck supple.  ?   Comments: Back: no C, T, L spine TTP, no step off or deformity ?RUE: no TTP throughout, no deformity, normal joint ROM, radial pulse intact, distal sensation and motor intact ?LUE: Some tenderness to the elbow, no deformity, normal joint ROM, radial pulse intact, distal sensation and motor intact ?RLE:  no TTP throughout, no deformity, normal joint ROM, distal pulse, sensation and motor intact ?LLE: Some tenderness to the hip, no deformity, normal joint ROM, distal pulse, sensation and motor intact  ?Skin: ?   General: Skin is warm and dry.  ?   Capillary Refill: Capillary refill takes less than 2 seconds.  ?Neurological:  ?   Mental Status: She is alert.  ?Psychiatric:     ?   Mood and Affect: Mood normal.  ? ? ?ED Results / Procedures / Treatments   ?Labs ?(all labs  ordered are listed, but only abnormal results are displayed) ?Labs Reviewed - No data to display ? ?EKG ?None ? ?Radiology ?DG Elbow Complete Left ? ?Result Date: 12/02/2021 ?CLINICAL DATA:  fall EXAM: LEFT ELBOW - COMPLETE 3+ VIEW COMPARISON:  None. FINDINGS: There is no evidence of fracture or dislocation. Vague cortical irregularity of the radial neck on lateral view could represent a nondisplaced fracture. Joint effusion. There is no evidence of arthropathy or other focal bone abnormality. Subcutaneus soft tissue edema. IMPRESSION: Left elbow joint effusion suggestive of an underlying occult fracture. Vague cortical irregularity of the radial neck on lateral view could represent a nondisplaced fracture. No  definite acute displaced fracture or dislocation. Electronically Signed   By: Tish Frederickson M.D.   On: 12/02/2021 18:56  ? ?CT Head Wo Contrast ? ?Result Date: 12/02/2021 ?CLINICAL DATA:  Larey Seat, hit head, left frontal scalp hematoma EXAM: CT HEAD WITHOUT CONTRAST TECHNIQUE: Contiguous axial images were obtained from the base of the skull through the vertex without intravenous contrast. RADIATION DOSE REDUCTION: This exam was performed according to the departmental dose-optimization program which includes automated exposure control, adjustment of the mA and/or kV according to patient size and/or use of iterative reconstruction technique. COMPARISON:  06/14/2019 FINDINGS: Brain: Hypodensities throughout the periventricular and subcortical white matter consistent with chronic small vessel ischemic change. No evidence of acute infarct or hemorrhage. Lateral ventricles and remaining midline structures are unremarkable. No acute extra-axial fluid collections. No mass effect. Vascular: No hyperdense vessel or unexpected calcification. Skull: Large left frontal scalp hematoma. No underlying fracture. Remainder of the calvarium is unremarkable. Sinuses/Orbits: Minimal mucosal thickening within the maxillary sinuses.  Remaining paranasal sinuses are clear. Other: None. IMPRESSION: 1. Large left frontal scalp hematoma.  No underlying fracture. 2. No acute intracranial process. Electronically Signed   By: Maxwell Caul.D.

## 2021-12-05 ENCOUNTER — Emergency Department (HOSPITAL_BASED_OUTPATIENT_CLINIC_OR_DEPARTMENT_OTHER)
Admission: EM | Admit: 2021-12-05 | Discharge: 2021-12-05 | Disposition: A | Discharge status: Home / Self Care | Payer: Federal, State, Local not specified - PPO | Attending: Emergency Medicine | Admitting: Emergency Medicine

## 2021-12-05 ENCOUNTER — Other Ambulatory Visit: Payer: Self-pay

## 2021-12-05 ENCOUNTER — Emergency Department (HOSPITAL_BASED_OUTPATIENT_CLINIC_OR_DEPARTMENT_OTHER): Payer: Federal, State, Local not specified - PPO

## 2021-12-05 ENCOUNTER — Encounter (HOSPITAL_BASED_OUTPATIENT_CLINIC_OR_DEPARTMENT_OTHER): Payer: Self-pay | Admitting: Emergency Medicine

## 2021-12-05 DIAGNOSIS — S0990XA Unspecified injury of head, initial encounter: Secondary | ICD-10-CM | POA: Diagnosis not present

## 2021-12-05 DIAGNOSIS — R519 Headache, unspecified: Secondary | ICD-10-CM

## 2021-12-05 DIAGNOSIS — I10 Essential (primary) hypertension: Secondary | ICD-10-CM | POA: Insufficient documentation

## 2021-12-05 DIAGNOSIS — M4316 Spondylolisthesis, lumbar region: Secondary | ICD-10-CM | POA: Diagnosis not present

## 2021-12-05 DIAGNOSIS — M5442 Lumbago with sciatica, left side: Secondary | ICD-10-CM | POA: Diagnosis not present

## 2021-12-05 DIAGNOSIS — S0083XA Contusion of other part of head, initial encounter: Secondary | ICD-10-CM | POA: Insufficient documentation

## 2021-12-05 DIAGNOSIS — S0003XA Contusion of scalp, initial encounter: Secondary | ICD-10-CM | POA: Diagnosis not present

## 2021-12-05 DIAGNOSIS — J45909 Unspecified asthma, uncomplicated: Secondary | ICD-10-CM | POA: Insufficient documentation

## 2021-12-05 DIAGNOSIS — M48061 Spinal stenosis, lumbar region without neurogenic claudication: Secondary | ICD-10-CM | POA: Diagnosis not present

## 2021-12-05 DIAGNOSIS — W010XXA Fall on same level from slipping, tripping and stumbling without subsequent striking against object, initial encounter: Secondary | ICD-10-CM | POA: Diagnosis not present

## 2021-12-05 DIAGNOSIS — M5432 Sciatica, left side: Secondary | ICD-10-CM

## 2021-12-05 MED ORDER — HYDROCODONE-ACETAMINOPHEN 5-325 MG PO TABS
1.0000 | ORAL_TABLET | Freq: Once | ORAL | Status: AC
  Administered 2021-12-05: 1 via ORAL
  Filled 2021-12-05: qty 1

## 2021-12-05 MED ORDER — PREDNISONE 20 MG PO TABS: 40.0000 mg | ORAL_TABLET | Freq: Every day | ORAL | 0 refills | Status: DC

## 2021-12-05 MED ORDER — PREDNISONE 50 MG PO TABS
60.0000 mg | ORAL_TABLET | Freq: Once | ORAL | Status: AC
  Administered 2021-12-05: 60 mg via ORAL
  Filled 2021-12-05: qty 1

## 2021-12-05 NOTE — ED Triage Notes (Signed)
Pt reports fall on the 15th. Pt seen at [facility] on day of fall. Pt here today for reevaluation of her left leg and hip pain.  ?

## 2021-12-05 NOTE — ED Provider Notes (Signed)
?[facility] EMERGENCY DEPARTMENT ?Provider Note ? ? ?CSN: [id number] ?Arrival date & time: 12/05/21  1432 ? ?  ? ?History ?Chief Complaint  ?Patient presents with  ? Leg Pain  ? ? ?Catherine Hubbard is a 61 y.o. female with history of alcohol use disorder, MS, hypertension, GERD, asthma, sciatica presents the emergency department for evaluation of left-sided lower back pain radiating to her anterior thigh down to the knee since Saturday.  On Saturday, the patient had a mechanical fall where she hit her head on some concrete.  She was evaluated in the emergency department with CT imaging.  She reports that since then, she has had this pain in her left lower back has been radiating around to the front of her thigh.  She denies any urinary or fecal incontinence.  Denies any urinary retention.  She denies any IV drug use ever or any fevers.  She does not denies any chest pain or shortness of breath.  She reports that she still is having some headaches as well as some lightheadedness given her recent fall.  She denies any blurry vision.  She denies any numbness or tingling or weakness down her legs. She has not followed up with an orthopedic provider about her elbow yet.  ? ? ?Leg Pain ?Associated symptoms: back pain   ?Associated symptoms: no fever   ? ?  ? ?Home Medications ?Prior to Admission medications   ?Medication Sig Start Date End Date Taking? Authorizing Provider  ?albuterol (PROVENTIL) (2.5 MG/3ML) 0.083% nebulizer solution Take 3 mLs (2.5 mg total) by nebulization every 6 (six) hours as needed for wheezing or shortness of breath. 11/26/19   Saguier, Ramon Dredge, PA-C  ?albuterol (VENTOLIN HFA) 108 (90 Base) MCG/ACT inhaler TAKE 2 PUFFS BY MOUTH EVERY 6 HOURS AS NEEDED FOR WHEEZE OR SHORTNESS OF BREATH 05/26/21   Saguier, Ramon Dredge, PA-C  ?amitriptyline (ELAVIL) 10 MG tablet TAKE 1 TABLET BY MOUTH EVERYDAY AT BEDTIME 10/20/21   Raulkar, Drema Pry, MD  ?baclofen (LIORESAL) 10 MG tablet Take 1 tablet (10 mg  total) by mouth 3 (three) times daily. 06/12/21   Lomax, Amy, NP  ?budesonide-formoterol (SYMBICORT) 80-4.5 MCG/ACT inhaler TAKE 2 PUFFS BY MOUTH TWICE A DAY 06/12/21   Saguier, Ramon Dredge, PA-C  ?Cholecalciferol (D2000 ULTRA STRENGTH) 50 MCG (2000 UT) CAPS Take 1 capsule by mouth daily.     [provider]  ?COVID-19 mRNA bivalent vaccine, Pfizer, injection Inject into the muscle. 05/15/21   Judyann Munson, MD  ?diclofenac Sodium (VOLTAREN) 1 % GEL APPLY 4 G TOPICALLY 4 (FOUR) TIMES DAILY. ?Patient taking differently: Apply 4 g topically 4 (four) times daily. PRN 12/24/19   Raulkar, Drema Pry, MD  ?FLUoxetine (PROZAC) 40 MG capsule TAKE 1 CAPSULE BY MOUTH EVERY DAY 05/04/21   Raulkar, Drema Pry, MD  ?fluticasone (FLONASE) 50 MCG/ACT nasal spray SPRAY 2 SPRAYS INTO EACH NOSTRIL EVERY DAY 10/16/21   Saguier, Ramon Dredge, PA-C  ?gabapentin (NEURONTIN) 100 MG capsule TAKE 1 CAPSULE (100 MG TOTAL) BY MOUTH 3 TIMES A DAY 10/16/21   Saguier, Ramon Dredge, PA-C  ?influenza vac split quadrivalent PF (FLUARIX) 0.5 ML injection Inject into the muscle. 05/15/21   Judyann Munson, MD  ?meloxicam (MOBIC) 7.5 MG tablet Take 1 tablet (7.5 mg total) by mouth daily. 02/24/21   Raulkar, Drema Pry, MD  ?modafinil (PROVIGIL) 200 MG tablet TAKE 1 TABLET BY MOUTH EVERY DAY 11/02/20   Sater, Pearletha Furl, MD  ?Multiple Vitamin (MULTIVITAMIN WITH MINERALS) TABS tablet Take 1 tablet by mouth  daily.    [provider]  ?OCREVUS 300 MG/10ML injection Inject 20 mLs (600 mg total) into the vein every 6 (six) months. 10/05/19   Lomax, Amy, NP  ?olmesartan (BENICAR) 5 MG tablet TAKE 2 TABLETS BY MOUTH EVERY DAY 12/04/21   Saguier, Ramon Dredge, PA-C  ?omeprazole (PRILOSEC) 20 MG capsule Take by mouth. 07/09/20   [provider]  ?oxybutynin (DITROPAN) 5 MG tablet TAKE 1 TABLET BY MOUTH TWICE A DAY 07/17/21   Sater, Pearletha Furl, MD  ?oxyCODONE-acetaminophen (PERCOCET/ROXICET) 5-325 MG tablet Take 1 tablet by mouth every 6 (six) hours as needed for up to 3  days for severe pain. 12/03/21 12/06/21  Milagros Loll, MD  ?venlafaxine XR (EFFEXOR-XR) 37.5 MG 24 hr capsule TAKE 1 CAPSULE BY MOUTH DAILY WITH BREAKFAST. PATIENT NEEDS APPOINTMENT BEFORE ANY FURTHER REFILLS 10/20/21   Raulkar, Drema Pry, MD  ?   ? ?Allergies    ?Ibuprofen and Nsaids   ? ?Review of Systems   ?Review of Systems  ?Constitutional:  Negative for chills and fever.  ?HENT:  Negative for rhinorrhea and sore throat.   ?Eyes:  Negative for visual disturbance.  ?Genitourinary:  Negative for dysuria and hematuria.  ?Musculoskeletal:  Positive for back pain.  ?Neurological:  Positive for light-headedness and headaches. Negative for dizziness, weakness and numbness.  ? ?Physical Exam ?Updated Vital Signs ?BP (!) 139/96 (BP Location: Right Arm)   Pulse 95   Temp 98 ?F (36.7 ?C) (Oral)   Resp 17   LMP 08/13/2014 Comment: very few periods  SpO2 99%  ?Physical Exam ?Vitals and nursing note reviewed.  ?Constitutional:   ?   General: She is not in acute distress. ?   Appearance: She is not toxic-appearing.  ?HENT:  ?   Head:  ?   Comments: Patient has a moderately sized soft hematoma with overlying abrasion noted to the mid left forehead.  Mildly tender to touch.  No battle signs. ?   Right Ear: Tympanic membrane, ear canal and external ear normal.  ?   Left Ear: Tympanic membrane, ear canal and external ear normal.  ?   Ears:  ?   Comments: Small amount of cerumen removed from the right ear.  No hemotympanums bilaterally. ?   Nose: Nose normal. No rhinorrhea.  ?   Mouth/Throat:  ?   Mouth: Mucous membranes are moist.  ?Eyes:  ?   General: No visual field deficit or scleral icterus. ?   Extraocular Movements: Extraocular movements intact.  ?   Pupils: Pupils are equal, round, and reactive to light.  ?   Comments: Bilateral periorbital hematomas.  PERRLA.  EOMI without pain.  ?Cardiovascular:  ?   Rate and Rhythm: Normal rate and regular rhythm.  ?Pulmonary:  ?   Effort: Pulmonary effort is normal.  ?   Breath  sounds: Normal breath sounds.  ?Abdominal:  ?   General: Bowel sounds are normal.  ?   Palpations: Abdomen is soft.  ?   Tenderness: There is no abdominal tenderness. There is no guarding or rebound.  ?Musculoskeletal:     ?   General: Tenderness and signs of injury present. No swelling.  ?   Cervical back: Normal range of motion. No tenderness.  ?   Right lower leg: No edema.  ?   Left lower leg: No edema.  ?   Comments: Patient's left arm is still in Ace wrap splint and sling.  Her cap refill and sensation is intact.  She  is still able to ambulate her fingers. ? ?Back-patient has no midline cervical, thoracic, or lumbar tenderness palpation.  Patient does have some mild tenderness palpation of the left lower paraspinal lumbar, mainly over the left buttock.  No step-off or deformity palpated.  No overlying skin changes noted.  Positive straight leg raise.  Positive contralateral straight leg raise.  She has palpable DP and PT pulses bilaterally.  Compartments are soft.  No edema.  She does have some varicosities noted to bilateral legs which she reports are not new.  Sensations intact bilaterally.  Strength equal bilaterally.  ?Skin: ?   General: Skin is warm and dry.  ?Neurological:  ?   General: No focal deficit present.  ?   Mental Status: She is alert and oriented to person, place, and time. Mental status is at baseline.  ?   Cranial Nerves: Cranial nerves 2-12 are intact. No dysarthria or facial asymmetry.  ?   Coordination: Finger-Nose-Finger Test normal.  ?   Comments: Patient's alert and oriented x3.  She answers questions appropriate with appropriate speech.  No facial droop noted.  Cranial nerves II through XII are intact.  Unable to assess pronator drift due to the patient's cast her left arm.  She does report equal sensation in her bilateral hands, legs, and face.  She has normal finger-nose with her right hand, again unable to assess left.  ? ? ?ED Results / Procedures / Treatments   ?Labs ?(all labs  ordered are listed, but only abnormal results are displayed) ?Labs Reviewed - No data to display ? ?EKG ?None ? ?Radiology ?DG Elbow Complete Left ? ?Result Date: 12/02/2021 ?CLINICAL DATA:  fall EXAM: LEFT ELB

## 2021-12-05 NOTE — Discharge Instructions (Addendum)
You are seen today for evaluation of your low left back pain.  Your CT imaging just shows chronic stable changes.  No acute fractures were noted.  This is likely sciatic pain.  I am included more information on sciatic pain in the sister paperwork.  For this, I am prescribing you prednisone that she will take once daily for the next 5 days.  You can also take Tylenol as needed for pain.  You can apply ice and/or heat to the area, whichever feels better for you.  If you are unable to control your bowel or bladder, have a fever, have not urinated in over 12 hours, please return to the nearest emergency department for reevaluation. ? ?Contact a doctor if: ?You have pain that: ?Wakes you up when you are sleeping. ?Gets worse when you lie down. ?Is worse than the pain you have had in the past. ?Lasts longer than 4 weeks. ?You lose weight without trying. ?Get help right away if: ?You cannot control when you pee (urinate) or poop (have a bowel movement). ?You have weakness in any of these areas and it gets worse: ?Lower back. ?The area between your hip bones. ?Butt. ?Legs. ?You have redness or swelling of your back. ?You have a burning feeling when you pee. ?

## 2021-12-11 DIAGNOSIS — M25522 Pain in left elbow: Secondary | ICD-10-CM | POA: Diagnosis not present

## 2021-12-11 DIAGNOSIS — M25552 Pain in left hip: Secondary | ICD-10-CM | POA: Diagnosis not present

## 2021-12-13 DIAGNOSIS — S0083XA Contusion of other part of head, initial encounter: Secondary | ICD-10-CM | POA: Diagnosis not present

## 2021-12-13 DIAGNOSIS — R296 Repeated falls: Secondary | ICD-10-CM | POA: Diagnosis not present

## 2021-12-16 DIAGNOSIS — M25552 Pain in left hip: Secondary | ICD-10-CM | POA: Diagnosis not present

## 2021-12-18 ENCOUNTER — Ambulatory Visit: Payer: Federal, State, Local not specified - PPO | Admitting: Medical

## 2021-12-18 ENCOUNTER — Telehealth: Payer: Self-pay

## 2021-12-18 VITALS — BP 136/80 | HR 80 | Temp 98.2°F | Resp 18 | Ht 63.0 in | Wt 164.0 lb

## 2021-12-18 DIAGNOSIS — R7989 Other specified abnormal findings of blood chemistry: Secondary | ICD-10-CM | POA: Diagnosis not present

## 2021-12-18 DIAGNOSIS — F418 Other specified anxiety disorders: Secondary | ICD-10-CM | POA: Diagnosis not present

## 2021-12-18 DIAGNOSIS — T148XXA Other injury of unspecified body region, initial encounter: Secondary | ICD-10-CM

## 2021-12-18 DIAGNOSIS — F1010 Alcohol abuse, uncomplicated: Secondary | ICD-10-CM

## 2021-12-18 DIAGNOSIS — D649 Anemia, unspecified: Secondary | ICD-10-CM

## 2021-12-18 DIAGNOSIS — I10 Essential (primary) hypertension: Secondary | ICD-10-CM

## 2021-12-18 DIAGNOSIS — R748 Abnormal levels of other serum enzymes: Secondary | ICD-10-CM

## 2021-12-18 DIAGNOSIS — E875 Hyperkalemia: Secondary | ICD-10-CM

## 2021-12-18 DIAGNOSIS — Z8781 Personal history of (healed) traumatic fracture: Secondary | ICD-10-CM

## 2021-12-18 MED ORDER — OLMESARTAN MEDOXOMIL 5 MG PO TABS: ORAL_TABLET | ORAL | 3 refills | Status: DC

## 2021-12-18 NOTE — Telephone Encounter (Signed)
Spoke with [facility] stated patient doesn't have celebrex or flexeril on profile  ?

## 2021-12-18 NOTE — Patient Instructions (Addendum)
For recent pelvic fracture and various other injuries continue celebrex, flexeril and norco. Please call me back and give me dose of celebrex and flexeril that you are on as  I can't see those in computer system. Follow orthopedist tx plan. ? ?Htn- bp well controlled. Continue olmesartan 10 mg daily. Will get cmp and lipid panel today. ? ?Depression and anxiety relatively up and down since falls and injuries. Continue current effexor and prozac. Continue with pyschiatrist. ? ?Elevate liver enzymes. Follow cbc and cmp. ? ? ?Follow up 3 months or sooner if needed. ? ? ?

## 2021-12-18 NOTE — Progress Notes (Signed)
? ?Subjective:  ? ? Patient ID: Catherine Hubbard, female    DOB: 08-14-1961, 61 y.o.   MRN: [id number] ? ?HPI ? ?Pt in for follow up. ? ?Pt had fall recently. ? ?Arrival date & time: 12/02/21  1805 ?  ?  ?History ?  ?   ?Chief Complaint  ?Patient presents with  ? Fall  ?  ?  ?Catherine Hubbard is a 61 y.o. female.  Presents to the emergency room with concern for fall.  Patient tripped on a curb while leaving work, which she hit her head on pavement.  His LOC.  States that she is having pain in her left elbow and left hip.  No nausea or vomiting, states pain is currently moderate to severe, worse with movement.  Has been able to bear weight on her legs since the fall.  Not on blood thinner. ? ? ?HENT:  ?   Head: Normocephalic.  ?   Comments: Mild forehead hematoma/abrasion. ? ? ?Musculoskeletal:     ?   General: No swelling.  ?   Cervical back: Neck supple.  ?   Comments: Back: no C, T, L spine TTP, no step off or deformity ?RUE: no TTP throughout, no deformity, normal joint ROM, radial pulse intact, distal sensation and motor intact ?LUE: Some tenderness to the elbow, no deformity, normal joint ROM, radial pulse intact, distal sensation and motor intact ?RLE:  no TTP throughout, no deformity, normal joint ROM, distal pulse, sensation and motor intact ?LLE: Some tenderness to the hip, no deformity, normal joint ROM, distal pulse, sensation and motor intact  ? ?Medical Decision Making ?Amount and/or Complexity of Data Reviewed ?Radiology: ordered. ?  ?Risk ?Prescription drug management. ?  ? A/P ? ?"61 year old lady presented to ER with concern for mechanical fall, head trauma, hip pain, elbow pain.  CT head, C-spine negative for acute traumatic pathology.  Plain film of left hip negative.  Plain film of left elbow concerning for possible occult elbow fracture.  She does have associated tenderness on exam, will place in posterior long-arm splint, sling, recommend nonweightbearing and close outpatient  orthopedic surgery for follow-up regarding the possibility of occult nondisplaced elbow fracture.  Patient and husband demonstrated understanding.  Her pain is well controlled at this time, discharged home." ? ?CT head, ct lumbar, xray of hip, CT cervical and  xray elbow sine 12-02-21. ? ?Of all these areas pt told did have pelvic fracture by mri. This one was done at orthopedist office. Told small fracture seen. Not requiring surgery per pt. ? ?Pt given celebrex fo pain. Flexeril and norco. ? ?Pt was getting ocyxodone at [facility]. But now on noro. ? ?  ?History of elevated liver enzymes.  Want to get metabolic panel today. ? ?Hx of htn- pt on benicar 5 mg daily.  ? ?Depression and anxiety. Pt is on prozac and effexor. Also was on elavil at night. ? ?With recent falls pt stats not result of alcohol use/had not been drinking per pt report. ? ? ? ?Past Medical History:  ?Diagnosis Date  ? Alcohol abuse   ? Asthma   ? Esophagitis   ? GERD (gastroesophageal reflux disease)   ? Headache   ? Hearing loss   ? Hypertension   ? Multiple sclerosis ([place])   ? Pancreatitis   ? Vision abnormalities   ? ?  ?Social History  ? ?Socioeconomic History  ? Marital status: Married  ?  Spouse name: Not on file  ?  Number of children: Not on file  ? Years of education: Not on file  ? Highest education level: Not on file  ?Occupational History  ? Not on file  ?Tobacco Use  ? Smoking status: Never  ? Smokeless tobacco: Never  ? Tobacco comments:  ?  second hand smoker when young. Heavy exposure.  ?Vaping Use  ? Vaping Use: Never used  ?Substance and Sexual Activity  ? Alcohol use: Yes  ?  Comment: daily  ? Drug use: No  ? Sexual activity: Not Currently  ?Other Topics Concern  ? Not on file  ?Social History Narrative  ? Not on file  ? ?Social Determinants of Health  ? ?Financial Resource Strain: Not on file  ?Food Insecurity: Not on file  ?Transportation Needs: Not on file  ?Physical Activity: Not on file  ?Stress: Not on file   ?Social Connections: Not on file  ?Intimate Partner Violence: Not on file  ? ? ?Past Surgical History:  ?Procedure Laterality Date  ? ANKLE FRACTURE SURGERY Bilateral   ? CARPAL TUNNEL RELEASE Bilateral   ? CHOLECYSTECTOMY    ? COLONOSCOPY  11/2016  ? multiple  ? ESOPHAGOGASTRODUODENOSCOPY  11/2016  ? ESOPHAGOGASTRODUODENOSCOPY N/A 02/01/2017  ? Procedure: ESOPHAGOGASTRODUODENOSCOPY (EGD);  Surgeon: Gatha Mayer, MD;  Location: [facility] ENDOSCOPY;  Service: Endoscopy;  Laterality: N/A;  ? ESOPHAGOGASTRODUODENOSCOPY (EGD) WITH PROPOFOL N/A 02/04/2017  ? Procedure: ESOPHAGOGASTRODUODENOSCOPY (EGD) WITH PROPOFOL;  Surgeon: Ladene Artist, MD;  Location: [facility] ENDOSCOPY;  Service: Endoscopy;  Laterality: N/A;  ? [place] RESECTION  2011  ? ULNAR NERVE REPAIR Bilateral   ? ? ?Family History  ?Problem Relation Age of Onset  ? Cancer Mother   ? Stroke Mother   ? Cancer Father   ? ? ?Allergies  ?Allergen Reactions  ? Ibuprofen Other (See Comments)  ?  Cannot take due to gastric bypass  ? Nsaids Other (See Comments)  ?  Gastric bypass  ? ? ?Current Outpatient Medications on File Prior to Visit  ?Medication Sig Dispense Refill  ? albuterol (PROVENTIL) (2.5 MG/3ML) 0.083% nebulizer solution Take 3 mLs (2.5 mg total) by nebulization every 6 (six) hours as needed for wheezing or shortness of breath. 150 mL 1  ? albuterol (VENTOLIN HFA) 108 (90 Base) MCG/ACT inhaler TAKE 2 PUFFS BY MOUTH EVERY 6 HOURS AS NEEDED FOR WHEEZE OR SHORTNESS OF BREATH 54 each 2  ? amitriptyline (ELAVIL) 10 MG tablet TAKE 1 TABLET BY MOUTH EVERYDAY AT BEDTIME 90 tablet 1  ? baclofen (LIORESAL) 10 MG tablet Take 1 tablet (10 mg total) by mouth 3 (three) times daily. 90 tablet 3  ? budesonide-formoterol (SYMBICORT) 80-4.5 MCG/ACT inhaler TAKE 2 PUFFS BY MOUTH TWICE A DAY 10.2 each 3  ? Cholecalciferol (D2000 ULTRA STRENGTH) 50 MCG (2000 UT) CAPS Take 1 capsule by mouth daily.     ? COVID-19 mRNA bivalent vaccine, Pfizer, injection Inject into  the muscle. 0.3 mL 0  ? diclofenac Sodium (VOLTAREN) 1 % GEL APPLY 4 G TOPICALLY 4 (FOUR) TIMES DAILY. (Patient taking differently: Apply 4 g topically 4 (four) times daily. PRN) 400 g 1  ? FLUoxetine (PROZAC) 40 MG capsule TAKE 1 CAPSULE BY MOUTH EVERY DAY 30 capsule 3  ? fluticasone (FLONASE) 50 MCG/ACT nasal spray SPRAY 2 SPRAYS INTO EACH NOSTRIL EVERY DAY 16 mL 12  ? gabapentin (NEURONTIN) 100 MG capsule TAKE 1 CAPSULE (100 MG TOTAL) BY MOUTH 3 TIMES A DAY 90 capsule 0  ? influenza vac split quadrivalent  PF (FLUARIX) 0.5 ML injection Inject into the muscle. 0.5 mL 0  ? meloxicam (MOBIC) 7.5 MG tablet Take 1 tablet (7.5 mg total) by mouth daily. 30 tablet 1  ? modafinil (PROVIGIL) 200 MG tablet TAKE 1 TABLET BY MOUTH EVERY DAY 30 tablet 3  ? Multiple Vitamin (MULTIVITAMIN WITH MINERALS) TABS tablet Take 1 tablet by mouth daily.    ? OCREVUS 300 MG/10ML injection Inject 20 mLs (600 mg total) into the vein every 6 (six) months. 20 mL 1  ? olmesartan (BENICAR) 5 MG tablet TAKE 2 TABLETS BY MOUTH EVERY DAY 60 tablet 5  ? omeprazole (PRILOSEC) 20 MG capsule Take by mouth.    ? oxybutynin (DITROPAN) 5 MG tablet TAKE 1 TABLET BY MOUTH TWICE A DAY 60 tablet 1  ? predniSONE (DELTASONE) 20 MG tablet Take 2 tablets (40 mg total) by mouth daily. 10 tablet 0  ? venlafaxine XR (EFFEXOR-XR) 37.5 MG 24 hr capsule TAKE 1 CAPSULE BY MOUTH DAILY WITH BREAKFAST. PATIENT NEEDS APPOINTMENT BEFORE ANY FURTHER REFILLS 90 capsule 1  ? ?No current facility-administered medications on file prior to visit.  ? ? ?BP 136/80   Pulse 80   Temp 98.2 ?F (36.8 ?C)   Resp 18   Ht 5\' 3"  (1.6 m)   Wt 164 lb (74.4 kg)   LMP 08/13/2014 Comment: very few periods  SpO2 100%   BMI 29.05 kg/m?  ?  ? ? ?Review of Systems  ?Constitutional:  Negative for chills, fatigue and fever.  ?Respiratory:  Negative for cough, chest tightness, shortness of breath and wheezing.   ?Cardiovascular:  Negative for chest pain and palpitations.  ?Gastrointestinal:   Negative for abdominal pain, constipation and nausea.  ?Genitourinary:  Negative for dysuria, flank pain, hematuria and urgency.  ?Musculoskeletal:  Negative for back pain, joint swelling, myalgias and neck stiffness.  ?

## 2021-12-19 ENCOUNTER — Other Ambulatory Visit: Payer: Self-pay | Admitting: Physical Medicine and Rehabilitation

## 2021-12-19 LAB — COMPREHENSIVE METABOLIC PANEL
ALT: 14 U/L (ref 0–35)
AST: 24 U/L (ref 0–37)
Albumin: 4.2 g/dL (ref 3.5–5.2)
Alkaline Phosphatase: 223 U/L — ABNORMAL HIGH (ref 39–117)
BUN: 13 mg/dL (ref 6–23)
CO2: 28 mEq/L (ref 19–32)
Calcium: 9.9 mg/dL (ref 8.4–10.5)
Chloride: 102 mEq/L (ref 96–112)
Creatinine, Ser: 0.73 mg/dL (ref 0.40–1.20)
GFR: 88.72 mL/min (ref >60.00)
Glucose, Bld: 86 mg/dL (ref 70–99)
Potassium: 5.7 mEq/L — ABNORMAL HIGH (ref 3.5–5.1)
Sodium: 137 mEq/L (ref 135–145)
Total Bilirubin: 0.7 mg/dL (ref 0.2–1.2)
Total Protein: 6.8 g/dL (ref 6.0–8.3)

## 2021-12-19 LAB — CBC WITH DIFFERENTIAL/PLATELET
Basophils Absolute: 0.1 10*3/uL (ref 0.0–0.1)
Basophils Relative: 0.7 % (ref 0.0–3.0)
Eosinophils Absolute: 0.2 10*3/uL (ref 0.0–0.7)
Eosinophils Relative: 2.9 % (ref 0.0–5.0)
HCT: 31.5 % — ABNORMAL LOW (ref 36.0–46.0)
Hemoglobin: 10.5 g/dL — ABNORMAL LOW (ref 12.0–15.0)
Lymphocytes Relative: 14.8 % (ref 12.0–46.0)
Lymphs Abs: 1 10*3/uL (ref 0.7–4.0)
MCHC: 33.4 g/dL (ref 30.0–36.0)
MCV: 89.8 fl (ref 78.0–100.0)
Monocytes Absolute: 0.9 10*3/uL (ref 0.1–1.0)
Monocytes Relative: 12.6 % — ABNORMAL HIGH (ref 3.0–12.0)
Neutro Abs: 4.7 10*3/uL (ref 1.4–7.7)
Neutrophils Relative %: 69 % (ref 43.0–77.0)
Platelets: 465 10*3/uL — ABNORMAL HIGH (ref 150.0–400.0)
RBC: 3.51 Mil/uL — ABNORMAL LOW (ref 3.87–5.11)
RDW: 16.7 % — ABNORMAL HIGH (ref 11.5–15.5)
WBC: 6.9 10*3/uL (ref 4.0–10.5)

## 2021-12-19 LAB — LIPID PANEL
Cholesterol: 135 mg/dL (ref 0–200)
HDL: 71.7 mg/dL (ref >39.00)
LDL Cholesterol: 50 mg/dL (ref 0–99)
NonHDL: 63.77
Total CHOL/HDL Ratio: 2
Triglycerides: 67 mg/dL (ref 0.0–149.0)
VLDL: 13.4 mg/dL (ref 0.0–40.0)

## 2021-12-19 LAB — GAMMA GT: GGT: 24 U/L (ref 7–51)

## 2021-12-19 NOTE — Telephone Encounter (Signed)
Pt called and lvm to return  

## 2021-12-20 ENCOUNTER — Other Ambulatory Visit (HOSPITAL_BASED_OUTPATIENT_CLINIC_OR_DEPARTMENT_OTHER): Payer: Self-pay

## 2021-12-20 MED ORDER — SODIUM POLYSTYRENE SULFONATE 15 GM/60ML PO SUSP
15.0000 g | Freq: Every day | ORAL | 0 refills | Status: DC
  Filled 2021-12-20: qty 240, 4d supply, fill #0

## 2021-12-20 NOTE — Addendum Note (Signed)
Addended by: Gwenevere Abbot on: 12/20/2021 11:26 AM ? ? Modules accepted: Orders ? ?

## 2021-12-21 ENCOUNTER — Other Ambulatory Visit: Payer: Self-pay | Admitting: Physical Medicine and Rehabilitation

## 2021-12-21 NOTE — Telephone Encounter (Signed)
Pt called and lvm to return call 

## 2021-12-29 ENCOUNTER — Other Ambulatory Visit (HOSPITAL_BASED_OUTPATIENT_CLINIC_OR_DEPARTMENT_OTHER): Payer: Self-pay

## 2022-01-09 ENCOUNTER — Telehealth: Payer: Self-pay

## 2022-01-09 ENCOUNTER — Other Ambulatory Visit: Payer: Self-pay | Admitting: Neurology

## 2022-01-09 MED ORDER — MELOXICAM 7.5 MG PO TABS: 7.5000 mg | ORAL_TABLET | Freq: Every day | ORAL | 2 refills | Status: DC

## 2022-01-09 NOTE — Telephone Encounter (Signed)
Last OV was on 10/23/21.  Next OV is scheduled for 05/01/22.  Last RX was written on 02/04/21 for 30 tabs.   [place] Drug Database has been reviewed.

## 2022-01-09 NOTE — Addendum Note (Signed)
Addended by: Sharlet Salina on: 01/09/2022 04:03 PM   Modules accepted: Orders

## 2022-01-09 NOTE — Telephone Encounter (Signed)
The pharmacy in [place] sent a request for a refill on Meloxicam.

## 2022-01-10 ENCOUNTER — Telehealth: Payer: Self-pay | Admitting: *Deleted

## 2022-01-10 DIAGNOSIS — M5414 Radiculopathy, thoracic region: Secondary | ICD-10-CM | POA: Diagnosis not present

## 2022-01-10 DIAGNOSIS — M9903 Segmental and somatic dysfunction of lumbar region: Secondary | ICD-10-CM | POA: Diagnosis not present

## 2022-01-10 DIAGNOSIS — M9905 Segmental and somatic dysfunction of pelvic region: Secondary | ICD-10-CM | POA: Diagnosis not present

## 2022-01-10 DIAGNOSIS — M5417 Radiculopathy, lumbosacral region: Secondary | ICD-10-CM | POA: Diagnosis not present

## 2022-01-10 NOTE — Telephone Encounter (Signed)
Submitted PA Modafinil on CMM. Key: [id number]. Received instant approval effective 12/11/2021 through 01/10/2023.   FYI-When entering pt name on PA form on CMM, it has to be listed as "Ronna Polio' in order to locate the pt.

## 2022-01-31 DIAGNOSIS — Z0289 Encounter for other administrative examinations: Secondary | ICD-10-CM

## 2022-02-05 ENCOUNTER — Other Ambulatory Visit: Payer: Self-pay | Admitting: Physical Medicine and Rehabilitation

## 2022-02-05 ENCOUNTER — Telehealth: Payer: Self-pay | Admitting: *Deleted

## 2022-02-05 ENCOUNTER — Telehealth: Payer: Self-pay

## 2022-02-05 ENCOUNTER — Other Ambulatory Visit: Payer: Self-pay | Admitting: Medical

## 2022-02-05 MED ORDER — FLUOXETINE HCL 40 MG PO CAPS: 40.0000 mg | ORAL_CAPSULE | Freq: Every day | ORAL | 3 refills | Status: DC

## 2022-02-05 NOTE — Telephone Encounter (Signed)
Gave completed/signed DMV form back to medical records to process for pt. 

## 2022-02-05 NOTE — Telephone Encounter (Signed)
Pharmacy is sending a refill request for a 90 day supply of Fluoxetine. Would you like to refill for 90 day supply?

## 2022-02-05 NOTE — Addendum Note (Signed)
Addended by: Sharlet Salina on: 02/05/2022 03:36 PM   Modules accepted: Orders

## 2022-02-05 NOTE — Telephone Encounter (Signed)
Attempted to send prescription but was unable due to the contraindication warnings

## 2022-02-08 ENCOUNTER — Telehealth: Payer: Self-pay | Admitting: *Deleted

## 2022-02-08 NOTE — Telephone Encounter (Signed)
Pt DMV form faxed on 02/08/2022

## 2022-02-14 ENCOUNTER — Telehealth: Payer: Self-pay

## 2022-02-14 NOTE — Telephone Encounter (Signed)
Received a prescriber service form from [facility] requesting renewal on provider service form.  I have completed and faxed to [place]. Fax # [phone], confirmation received.

## 2022-02-16 ENCOUNTER — Telehealth: Payer: Self-pay | Admitting: Medical

## 2022-02-16 ENCOUNTER — Other Ambulatory Visit: Payer: Self-pay | Admitting: Neurology

## 2022-02-16 ENCOUNTER — Other Ambulatory Visit (HOSPITAL_BASED_OUTPATIENT_CLINIC_OR_DEPARTMENT_OTHER): Payer: Self-pay

## 2022-02-16 MED ORDER — BUDESONIDE-FORMOTEROL FUMARATE 160-4.5 MCG/ACT IN AERO
2.0000 | INHALATION_SPRAY | Freq: Two times a day (BID) | RESPIRATORY_TRACT | 12 refills | Status: DC
  Filled 2022-02-16: qty 10.2, 30d supply, fill #0

## 2022-02-16 NOTE — Telephone Encounter (Signed)
Pt  notified and stated she would call back if the mediation is too expensive

## 2022-02-16 NOTE — Addendum Note (Signed)
Addended by: Gwenevere Abbot on: 02/16/2022 12:24 PM   Modules accepted: Orders

## 2022-02-16 NOTE — Telephone Encounter (Signed)
Pt called stating that her cost on her Symbicort is going to $183 and wanted to look into sending in the generic form to keep the cost down.  Medication:   budesonide-formoterol (SYMBICORT) 80-4.5 MCG/ACT inhaler [[id number]]   Has the patient contacted their pharmacy? Yes.   (If no, request that the patient contact the pharmacy for the refill.) (If yes, when and what did the pharmacy advise?)  Preferred Pharmacy (with phone number or street name):   [id number] [place], [place] - [address]  [address] [place] [address]  Phone:  [phone]  Fax:  [phone]   Agent: Please be advised that RX refills may take up to 3 business days. We ask that you follow-up with your pharmacy.

## 2022-02-19 NOTE — Telephone Encounter (Signed)
Verify Drug Registry For Modafinil 200 Mg Tablet Last Filled: 01/10/2022 Quantity: 30 tablets for 30 days Last appointment: 10/23/2021 Next appointment: 05/01/2022

## 2022-02-23 ENCOUNTER — Other Ambulatory Visit (HOSPITAL_BASED_OUTPATIENT_CLINIC_OR_DEPARTMENT_OTHER): Payer: Self-pay

## 2022-03-28 ENCOUNTER — Other Ambulatory Visit: Payer: Self-pay

## 2022-03-28 ENCOUNTER — Encounter (HOSPITAL_BASED_OUTPATIENT_CLINIC_OR_DEPARTMENT_OTHER): Payer: Self-pay

## 2022-03-28 ENCOUNTER — Emergency Department (HOSPITAL_BASED_OUTPATIENT_CLINIC_OR_DEPARTMENT_OTHER): Payer: Federal, State, Local not specified - PPO

## 2022-03-28 ENCOUNTER — Telehealth: Payer: Self-pay

## 2022-03-28 ENCOUNTER — Observation Stay (HOSPITAL_BASED_OUTPATIENT_CLINIC_OR_DEPARTMENT_OTHER)
Admission: EM | Admit: 2022-03-28 | Discharge: 2022-03-30 | Disposition: A | Discharge status: Home / Self Care | Payer: Federal, State, Local not specified - PPO | Attending: Internal Medicine | Admitting: Internal Medicine

## 2022-03-28 ENCOUNTER — Encounter (HOSPITAL_COMMUNITY): Payer: Self-pay

## 2022-03-28 DIAGNOSIS — E876 Hypokalemia: Secondary | ICD-10-CM | POA: Insufficient documentation

## 2022-03-28 DIAGNOSIS — Z79899 Other long term (current) drug therapy: Secondary | ICD-10-CM | POA: Insufficient documentation

## 2022-03-28 DIAGNOSIS — I10 Essential (primary) hypertension: Secondary | ICD-10-CM | POA: Diagnosis not present

## 2022-03-28 DIAGNOSIS — B9620 Unspecified Escherichia coli [E. coli] as the cause of diseases classified elsewhere: Secondary | ICD-10-CM | POA: Diagnosis not present

## 2022-03-28 DIAGNOSIS — R0781 Pleurodynia: Secondary | ICD-10-CM | POA: Diagnosis not present

## 2022-03-28 DIAGNOSIS — R296 Repeated falls: Secondary | ICD-10-CM | POA: Diagnosis not present

## 2022-03-28 DIAGNOSIS — W050XXA Fall from non-moving wheelchair, initial encounter: Secondary | ICD-10-CM | POA: Diagnosis not present

## 2022-03-28 DIAGNOSIS — K219 Gastro-esophageal reflux disease without esophagitis: Secondary | ICD-10-CM | POA: Insufficient documentation

## 2022-03-28 DIAGNOSIS — N390 Urinary tract infection, site not specified: Secondary | ICD-10-CM | POA: Insufficient documentation

## 2022-03-28 DIAGNOSIS — S5012XA Contusion of left forearm, initial encounter: Secondary | ICD-10-CM | POA: Insufficient documentation

## 2022-03-28 DIAGNOSIS — J45909 Unspecified asthma, uncomplicated: Secondary | ICD-10-CM | POA: Insufficient documentation

## 2022-03-28 DIAGNOSIS — E878 Other disorders of electrolyte and fluid balance, not elsewhere classified: Secondary | ICD-10-CM | POA: Diagnosis present

## 2022-03-28 DIAGNOSIS — S5011XA Contusion of right forearm, initial encounter: Secondary | ICD-10-CM | POA: Diagnosis not present

## 2022-03-28 DIAGNOSIS — E871 Hypo-osmolality and hyponatremia: Principal | ICD-10-CM | POA: Insufficient documentation

## 2022-03-28 DIAGNOSIS — G35 Multiple sclerosis: Secondary | ICD-10-CM | POA: Diagnosis present

## 2022-03-28 DIAGNOSIS — F1010 Alcohol abuse, uncomplicated: Secondary | ICD-10-CM | POA: Diagnosis not present

## 2022-03-28 DIAGNOSIS — Y92009 Unspecified place in unspecified non-institutional (private) residence as the place of occurrence of the external cause: Secondary | ICD-10-CM

## 2022-03-28 LAB — BASIC METABOLIC PANEL
Anion gap: 9 (ref 5–15)
Anion gap: 8 (ref 5–15)
BUN: 13 mg/dL (ref 8–23)
BUN: 10 mg/dL (ref 8–23)
CO2: 28 mmol/L (ref 22–32)
CO2: 28 mmol/L (ref 22–32)
Calcium: 8.6 mg/dL — ABNORMAL LOW (ref 8.9–10.3)
Calcium: 9.1 mg/dL (ref 8.9–10.3)
Chloride: 84 mmol/L — ABNORMAL LOW (ref 98–111)
Chloride: 94 mmol/L — ABNORMAL LOW (ref 98–111)
Creatinine, Ser: 0.78 mg/dL (ref 0.44–1.00)
Creatinine, Ser: 0.7 mg/dL (ref 0.44–1.00)
GFR, Estimated: >60 mL/min (ref >60)
GFR, Estimated: >60 mL/min (ref >60)
Glucose, Bld: 83 mg/dL (ref 70–99)
Glucose, Bld: 91 mg/dL (ref 70–99)
Potassium: 3.5 mmol/L (ref 3.5–5.1)
Potassium: 5.6 mmol/L — ABNORMAL HIGH (ref 3.5–5.1)
Sodium: 121 mmol/L — ABNORMAL LOW (ref 135–145)
Sodium: 130 mmol/L — ABNORMAL LOW (ref 135–145)

## 2022-03-28 LAB — CBC WITH DIFFERENTIAL/PLATELET
Abs Immature Granulocytes: 0.04 10*3/uL (ref 0.00–0.07)
Basophils Absolute: 0 10*3/uL (ref 0.0–0.1)
Basophils Relative: 0 %
Eosinophils Absolute: 0.1 10*3/uL (ref 0.0–0.5)
Eosinophils Relative: 1 %
HCT: 26.5 % — ABNORMAL LOW (ref 36.0–46.0)
Hemoglobin: 9.1 g/dL — ABNORMAL LOW (ref 12.0–15.0)
Immature Granulocytes: 0 %
Lymphocytes Relative: 13 %
Lymphs Abs: 1.2 10*3/uL (ref 0.7–4.0)
MCH: 29.1 pg (ref 26.0–34.0)
MCHC: 34.3 g/dL (ref 30.0–36.0)
MCV: 84.7 fL (ref 80.0–100.0)
Monocytes Absolute: 1.2 10*3/uL — ABNORMAL HIGH (ref 0.1–1.0)
Monocytes Relative: 13 %
Neutro Abs: 6.6 10*3/uL (ref 1.7–7.7)
Neutrophils Relative %: 73 %
Platelets: 298 10*3/uL (ref 150–400)
RBC: 3.13 MIL/uL — ABNORMAL LOW (ref 3.87–5.11)
RDW: 14.9 % (ref 11.5–15.5)
WBC: 9.3 10*3/uL (ref 4.0–10.5)
nRBC: 0 % (ref 0.0–0.2)

## 2022-03-28 LAB — COMPREHENSIVE METABOLIC PANEL
ALT: 12 U/L (ref 0–44)
AST: 35 U/L (ref 15–41)
Albumin: 3.6 g/dL (ref 3.5–5.0)
Alkaline Phosphatase: 84 U/L (ref 38–126)
Anion gap: 11 (ref 5–15)
BUN: 15 mg/dL (ref 8–23)
CO2: 26 mmol/L (ref 22–32)
Calcium: 8.4 mg/dL — ABNORMAL LOW (ref 8.9–10.3)
Chloride: 83 mmol/L — ABNORMAL LOW (ref 98–111)
Creatinine, Ser: 0.95 mg/dL (ref 0.44–1.00)
GFR, Estimated: >60 mL/min (ref >60)
Glucose, Bld: 91 mg/dL (ref 70–99)
Potassium: 2.6 mmol/L — CL (ref 3.5–5.1)
Sodium: 120 mmol/L — ABNORMAL LOW (ref 135–145)
Total Bilirubin: 0.4 mg/dL (ref 0.3–1.2)
Total Protein: 6.6 g/dL (ref 6.5–8.1)

## 2022-03-28 LAB — URINALYSIS, MICROSCOPIC (REFLEX)

## 2022-03-28 LAB — BRAIN NATRIURETIC PEPTIDE: B Natriuretic Peptide: 79.7 pg/mL (ref 0.0–100.0)

## 2022-03-28 LAB — URINALYSIS, ROUTINE W REFLEX MICROSCOPIC
Bilirubin Urine: NEGATIVE
Glucose, UA: NEGATIVE mg/dL
Ketones, ur: NEGATIVE mg/dL
Leukocytes,Ua: NEGATIVE
Nitrite: POSITIVE — AB
Protein, ur: NEGATIVE mg/dL
Specific Gravity, Urine: <=1.005 (ref 1.005–1.030)
pH: 5 (ref 5.0–8.0)

## 2022-03-28 LAB — PROTIME-INR
INR: 1 (ref 0.8–1.2)
Prothrombin Time: 13.1 seconds (ref 11.4–15.2)

## 2022-03-28 LAB — SODIUM, URINE, RANDOM: Sodium, Ur: <10 mmol/L

## 2022-03-28 LAB — CBG MONITORING, ED: Glucose-Capillary: 74 mg/dL (ref 70–99)

## 2022-03-28 LAB — ETHANOL: Alcohol, Ethyl (B): 208 mg/dL — ABNORMAL HIGH (ref <10)

## 2022-03-28 LAB — PHOSPHORUS: Phosphorus: 2.7 mg/dL (ref 2.5–4.6)

## 2022-03-28 LAB — MAGNESIUM: Magnesium: 1.4 mg/dL — ABNORMAL LOW (ref 1.7–2.4)

## 2022-03-28 MED ORDER — THIAMINE HCL 100 MG/ML IJ SOLN: 100.0000 mg | Freq: Every day | INTRAMUSCULAR | Status: DC

## 2022-03-28 MED ORDER — ONDANSETRON HCL 4 MG/2ML IJ SOLN: 4.0000 mg | Freq: Four times a day (QID) | INTRAMUSCULAR | Status: DC | PRN

## 2022-03-28 MED ORDER — ALBUTEROL SULFATE (2.5 MG/3ML) 0.083% IN NEBU
2.5000 mg | INHALATION_SOLUTION | Freq: Four times a day (QID) | RESPIRATORY_TRACT | Status: DC | PRN
Start: 2022-03-28 — End: 2022-03-30

## 2022-03-28 MED ORDER — LORAZEPAM 1 MG PO TABS: 0.0000 mg | ORAL_TABLET | Freq: Two times a day (BID) | ORAL | Status: DC

## 2022-03-28 MED ORDER — LORAZEPAM 2 MG/ML IJ SOLN: 0.0000 mg | Freq: Four times a day (QID) | INTRAMUSCULAR | Status: AC

## 2022-03-28 MED ORDER — FOLIC ACID 1 MG PO TABS
1.0000 mg | ORAL_TABLET | Freq: Every day | ORAL | Status: DC
  Administered 2022-03-29 – 2022-03-30 (×2): 1 mg via ORAL
  Filled 2022-03-28 (×2): qty 1

## 2022-03-28 MED ORDER — POTASSIUM CHLORIDE IN NACL 20-0.9 MEQ/L-% IV SOLN
INTRAVENOUS | Status: DC
  Filled 2022-03-28 (×2): qty 1000

## 2022-03-28 MED ORDER — ACETAMINOPHEN 325 MG PO TABS
650.0000 mg | ORAL_TABLET | Freq: Four times a day (QID) | ORAL | Status: DC | PRN
  Administered 2022-03-29 (×2): 650 mg via ORAL
  Filled 2022-03-28 (×2): qty 2

## 2022-03-28 MED ORDER — POTASSIUM CHLORIDE 10 MEQ/100ML IV SOLN
10.0000 meq | Freq: Once | INTRAVENOUS | Status: AC
  Administered 2022-03-28: 10 meq via INTRAVENOUS
  Filled 2022-03-28: qty 100

## 2022-03-28 MED ORDER — FLUTICASONE FUROATE-VILANTEROL 200-25 MCG/ACT IN AEPB
1.0000 | INHALATION_SPRAY | Freq: Every day | RESPIRATORY_TRACT | Status: DC
  Administered 2022-03-29 – 2022-03-30 (×2): 1 via RESPIRATORY_TRACT
  Filled 2022-03-28: qty 28

## 2022-03-28 MED ORDER — IRBESARTAN 150 MG PO TABS
75.0000 mg | ORAL_TABLET | Freq: Every day | ORAL | Status: DC
  Administered 2022-03-29: 75 mg via ORAL
  Filled 2022-03-28: qty 1

## 2022-03-28 MED ORDER — ONDANSETRON HCL 4 MG PO TABS: 4.0000 mg | ORAL_TABLET | Freq: Four times a day (QID) | ORAL | Status: DC | PRN

## 2022-03-28 MED ORDER — MAGNESIUM OXIDE -MG SUPPLEMENT 400 (240 MG) MG PO TABS
400.0000 mg | ORAL_TABLET | Freq: Once | ORAL | Status: AC
  Administered 2022-03-28: 400 mg via ORAL
  Filled 2022-03-28: qty 1

## 2022-03-28 MED ORDER — ACETAMINOPHEN 650 MG RE SUPP: 650.0000 mg | Freq: Four times a day (QID) | RECTAL | Status: DC | PRN

## 2022-03-28 MED ORDER — POTASSIUM CHLORIDE CRYS ER 20 MEQ PO TBCR
40.0000 meq | EXTENDED_RELEASE_TABLET | Freq: Once | ORAL | Status: AC
  Administered 2022-03-28: 40 meq via ORAL
  Filled 2022-03-28: qty 2

## 2022-03-28 MED ORDER — SODIUM ZIRCONIUM CYCLOSILICATE 10 G PO PACK
10.0000 g | PACK | Freq: Once | ORAL | Status: AC
Start: 2022-03-29 — End: 2022-03-29
  Administered 2022-03-29: 10 g via ORAL
  Filled 2022-03-28: qty 1

## 2022-03-28 MED ORDER — MAGNESIUM SULFATE 2 GM/50ML IV SOLN
2.0000 g | Freq: Once | INTRAVENOUS | Status: AC
  Administered 2022-03-28: 2 g via INTRAVENOUS
  Filled 2022-03-28: qty 50

## 2022-03-28 MED ORDER — PANTOPRAZOLE SODIUM 40 MG PO TBEC
40.0000 mg | DELAYED_RELEASE_TABLET | Freq: Every day | ORAL | Status: DC
  Administered 2022-03-29 – 2022-03-30 (×2): 40 mg via ORAL
  Filled 2022-03-28 (×2): qty 1

## 2022-03-28 MED ORDER — VENLAFAXINE HCL ER 37.5 MG PO CP24
37.5000 mg | ORAL_CAPSULE | Freq: Every day | ORAL | Status: DC
  Administered 2022-03-29 – 2022-03-30 (×2): 37.5 mg via ORAL
  Filled 2022-03-28 (×2): qty 1

## 2022-03-28 MED ORDER — HYDROCODONE-ACETAMINOPHEN 5-325 MG PO TABS
1.0000 | ORAL_TABLET | Freq: Once | ORAL | Status: AC
  Administered 2022-03-28: 1 via ORAL
  Filled 2022-03-28: qty 1

## 2022-03-28 MED ORDER — THIAMINE HCL 100 MG PO TABS
100.0000 mg | ORAL_TABLET | Freq: Every day | ORAL | Status: DC
  Administered 2022-03-28: 100 mg via ORAL
  Filled 2022-03-28: qty 1

## 2022-03-28 MED ORDER — POTASSIUM CHLORIDE IN NACL 40-0.9 MEQ/L-% IV SOLN: INTRAVENOUS | Status: DC

## 2022-03-28 MED ORDER — LORAZEPAM 1 MG PO TABS: 1.0000 mg | ORAL_TABLET | ORAL | Status: DC | PRN

## 2022-03-28 MED ORDER — LORAZEPAM 2 MG/ML IJ SOLN: 1.0000 mg | INTRAMUSCULAR | Status: DC | PRN

## 2022-03-28 MED ORDER — FLUOXETINE HCL 20 MG PO CAPS
40.0000 mg | ORAL_CAPSULE | Freq: Every day | ORAL | Status: DC
  Administered 2022-03-29 – 2022-03-30 (×2): 40 mg via ORAL
  Filled 2022-03-28 (×2): qty 2

## 2022-03-28 MED ORDER — ENOXAPARIN SODIUM 40 MG/0.4ML IJ SOSY
40.0000 mg | PREFILLED_SYRINGE | INTRAMUSCULAR | Status: DC
Start: 2022-03-29 — End: 2022-03-30
  Administered 2022-03-29 – 2022-03-30 (×2): 40 mg via SUBCUTANEOUS
  Filled 2022-03-28 (×2): qty 0.4

## 2022-03-28 MED ORDER — SODIUM CHLORIDE 0.9 % IV SOLN: Freq: Once | INTRAVENOUS | Status: AC

## 2022-03-28 MED ORDER — LORAZEPAM 1 MG PO TABS: 0.0000 mg | ORAL_TABLET | Freq: Four times a day (QID) | ORAL | Status: AC

## 2022-03-28 MED ORDER — POLYETHYLENE GLYCOL 3350 17 G PO PACK: 17.0000 g | PACK | Freq: Every day | ORAL | Status: DC | PRN

## 2022-03-28 MED ORDER — THIAMINE HCL 100 MG PO TABS
100.0000 mg | ORAL_TABLET | Freq: Every day | ORAL | Status: DC
  Administered 2022-03-29 – 2022-03-30 (×2): 100 mg via ORAL
  Filled 2022-03-28 (×2): qty 1

## 2022-03-28 MED ORDER — POTASSIUM CHLORIDE CRYS ER 20 MEQ PO TBCR
40.0000 meq | EXTENDED_RELEASE_TABLET | Freq: Two times a day (BID) | ORAL | Status: DC
Start: 2022-03-28 — End: 2022-03-29
  Administered 2022-03-28: 40 meq via ORAL
  Filled 2022-03-28: qty 2

## 2022-03-28 MED ORDER — ADULT MULTIVITAMIN W/MINERALS CH
1.0000 | ORAL_TABLET | Freq: Every day | ORAL | Status: DC
  Administered 2022-03-29 – 2022-03-30 (×2): 1 via ORAL
  Filled 2022-03-28 (×2): qty 1

## 2022-03-28 MED ORDER — HYDRALAZINE HCL 20 MG/ML IJ SOLN: 10.0000 mg | Freq: Four times a day (QID) | INTRAMUSCULAR | Status: DC | PRN

## 2022-03-28 MED ORDER — LORAZEPAM 2 MG/ML IJ SOLN: 0.0000 mg | Freq: Two times a day (BID) | INTRAMUSCULAR | Status: DC

## 2022-03-28 MED ORDER — AMITRIPTYLINE HCL 10 MG PO TABS: 10.0000 mg | ORAL_TABLET | Freq: Every evening | ORAL | Status: DC | PRN

## 2022-03-28 MED ORDER — FENTANYL CITRATE PF 50 MCG/ML IJ SOSY
50.0000 ug | PREFILLED_SYRINGE | Freq: Once | INTRAMUSCULAR | Status: AC
  Administered 2022-03-28: 50 ug via INTRAVENOUS
  Filled 2022-03-28: qty 1

## 2022-03-28 NOTE — ED Notes (Signed)
Pt provided microwaveable meal and diet sprite, per her request. Call bell within reach, no further needs expressed.  Will continue to monitor.

## 2022-03-28 NOTE — ED Notes (Signed)
Patient transported to X-ray 

## 2022-03-28 NOTE — H&P (Incomplete)
History and Physical    Patient: Catherine Hubbard MRN: [id number] DOA: 03/28/2022  Date of Service: the patient was seen and examined on 03/28/2022  Patient coming from: Home  Chief Complaint:  Chief Complaint  Patient presents with   Fall    HPI:   61 year old female with past medical history of alcohol abuse, relapsing remitting multiple sclerosis (follows with [facility], on Ocrevus infusions), gastroesophageal reflux disease, asthma, hypertension depression, anxiety disorder who presented to [facility] as a transfer from [facility] emergency department after presenting status post fall found to have multiple electrolyte derangements.  Patient explains that she has been feeling a generalized malaise and weakness over the past several days.  The evening of 8/8 the patient states that she was using her walker at approximately 10 PM when she felt that her legs were giving out and she fell to the ground.  The following morning she states that she fell to the ground from her wheelchair attempting to transfer into her own bed.  During that the second fall patient suffered some abrasions to her bilateral forearms.  Patient denies any focal weakness, slurred speech, visual changes, headaches or facial droop.  Patient presented to [facility] emergency department for evaluation.  Upon evaluation in the emergency department x-ray survey revealed no evidence of acute fracture with the exception of an age-indeterminate compression fracture of T8.  Workup did identify substantial hyponatremia of 120 with substantial hypokalemia of 2.6 and substantial hypomagnesemia of 1.4.  EDP discussed case with the hospitalist group and patient was accepted for transfer to [facility] to the progressive unit for correction of patient's multiple electrolyte derangements as well as monitoring for alcohol withdrawal.  Review of Systems: Review of Systems   Constitutional:  Positive for malaise/fatigue.  Musculoskeletal:  Positive for falls.  Neurological:  Positive for weakness.  All other systems reviewed and are negative.    Past Medical History:  Diagnosis Date   Alcohol abuse    Asthma    Esophagitis    GERD (gastroesophageal reflux disease)    Headache    Hearing loss    Hypertension    Multiple sclerosis ([place])    Pancreatitis    Vision abnormalities     Past Surgical History:  Procedure Laterality Date   ANKLE FRACTURE SURGERY Bilateral    CARPAL TUNNEL RELEASE Bilateral    CHOLECYSTECTOMY     COLONOSCOPY  11/2016   multiple   ESOPHAGOGASTRODUODENOSCOPY  11/2016   ESOPHAGOGASTRODUODENOSCOPY N/A 02/01/2017   Procedure: ESOPHAGOGASTRODUODENOSCOPY (EGD);  Surgeon: Gatha Mayer, MD;  Location: [facility] ENDOSCOPY;  Service: Endoscopy;  Laterality: N/A;   ESOPHAGOGASTRODUODENOSCOPY (EGD) WITH PROPOFOL N/A 02/04/2017   Procedure: ESOPHAGOGASTRODUODENOSCOPY (EGD) WITH PROPOFOL;  Surgeon: Ladene Artist, MD;  Location: [facility] ENDOSCOPY;  Service: Endoscopy;  Laterality: N/A;   LAPAROSCOPIC GASTRIC SLEEVE RESECTION  2011   ULNAR NERVE REPAIR Bilateral     Social History:  reports that she has never smoked. She has never used smokeless tobacco. She reports current alcohol use. She reports that she does not use drugs.  Allergies  Allergen Reactions   Ibuprofen Other (See Comments)    Cannot take due to gastric bypass   Nsaids Other (See Comments)    Gastric bypass    Family History  Problem Relation Age of Onset   Cancer Mother    Stroke Mother    Cancer Father     Prior to Admission medications   Medication Sig  Start Date End Date Taking? Authorizing Provider  modafinil (PROVIGIL) 200 MG tablet TAKE 1 TABLET BY MOUTH EVERY DAY 02/19/22   Sater, Pearletha Furl, MD  albuterol (PROVENTIL) (2.5 MG/3ML) 0.083% nebulizer solution Take 3 mLs (2.5 mg total) by nebulization every 6 (six) hours as needed for wheezing or shortness of  breath. 11/26/19   Saguier, Ramon Dredge, PA-C  albuterol (VENTOLIN HFA) 108 (90 Base) MCG/ACT inhaler TAKE 2 PUFFS BY MOUTH EVERY 6 HOURS AS NEEDED FOR WHEEZE OR SHORTNESS OF BREATH 05/26/21   Saguier, Ramon Dredge, PA-C  amitriptyline (ELAVIL) 10 MG tablet TAKE 1 TABLET BY MOUTH EVERYDAY AT BEDTIME Patient taking differently: Take 10 mg by mouth at bedtime as needed for sleep. 10/20/21   Raulkar, Drema Pry, MD  baclofen (LIORESAL) 10 MG tablet Take 1 tablet (10 mg total) by mouth 3 (three) times daily. 06/12/21   Lomax, Amy, NP  budesonide-formoterol (SYMBICORT) 160-4.5 MCG/ACT inhaler Inhale 2 puffs by mouth into the lungs 2 (two) times daily. 02/16/22   Saguier, Ramon Dredge, PA-C  budesonide-formoterol (SYMBICORT) 80-4.5 MCG/ACT inhaler TAKE 2 PUFFS BY MOUTH TWICE A DAY 02/05/22   Saguier, Ramon Dredge, PA-C  Cholecalciferol (D2000 ULTRA STRENGTH) 50 MCG (2000 UT) CAPS Take 1 capsule by mouth daily.     [provider]  COVID-19 mRNA bivalent vaccine, Pfizer, injection Inject into the muscle. 05/15/21   Judyann Munson, MD  diclofenac Sodium (VOLTAREN) 1 % GEL APPLY 4 G TOPICALLY 4 (FOUR) TIMES DAILY. Patient taking differently: Apply 4 g topically 4 (four) times daily. PRN 12/24/19   Raulkar, Drema Pry, MD  FLUoxetine (PROZAC) 40 MG capsule TAKE 1 CAPSULE BY MOUTH EVERY DAY 12/21/21   Raulkar, Drema Pry, MD  FLUoxetine (PROZAC) 40 MG capsule Take 1 capsule (40 mg total) by mouth daily. 02/05/22 02/05/23  Raulkar, Drema Pry, MD  fluticasone (FLONASE) 50 MCG/ACT nasal spray SPRAY 2 SPRAYS INTO EACH NOSTRIL EVERY DAY 10/16/21   Saguier, Ramon Dredge, PA-C  gabapentin (NEURONTIN) 100 MG capsule TAKE 1 CAPSULE (100 MG TOTAL) BY MOUTH 3 TIMES A DAY 10/16/21   Saguier, Ramon Dredge, PA-C  influenza vac split quadrivalent PF (FLUARIX) 0.5 ML injection Inject into the muscle. 05/15/21   Judyann Munson, MD  meloxicam (MOBIC) 7.5 MG tablet Take 1 tablet (7.5 mg total) by mouth daily. 01/09/22   Raulkar, Drema Pry, MD  Multiple Vitamin  (MULTIVITAMIN WITH MINERALS) TABS tablet Take 1 tablet by mouth daily.    [provider]  OCREVUS 300 MG/10ML injection Inject 20 mLs (600 mg total) into the vein every 6 (six) months. 10/05/19   Lomax, Amy, NP  olmesartan (BENICAR) 5 MG tablet TAKE 2 TABLETS BY MOUTH EVERY DAY 12/04/21   Saguier, Ramon Dredge, PA-C  olmesartan (BENICAR) 5 MG tablet 2 tab po daily 12/18/21   Saguier, Ramon Dredge, PA-C  omeprazole (PRILOSEC) 20 MG capsule Take by mouth. 07/09/20   [provider]  oxybutynin (DITROPAN) 5 MG tablet TAKE 1 TABLET BY MOUTH TWICE A DAY 07/17/21   Sater, Pearletha Furl, MD  predniSONE (DELTASONE) 20 MG tablet Take 2 tablets (40 mg total) by mouth daily. 12/05/21   Achille Rich, PA-C  sodium polystyrene (KAYEXALATE) 15 GM/60ML suspension Take 60 mLs (15 grams total) by mouth daily for 4 days. 12/20/21   Saguier, Ramon Dredge, PA-C  venlafaxine XR (EFFEXOR-XR) 37.5 MG 24 hr capsule TAKE 1 CAPSULE BY MOUTH DAILY WITH BREAKFAST. PATIENT NEEDS APPOINTMENT BEFORE ANY FURTHER REFILLS 10/20/21   Raulkar, Drema Pry, MD    Physical Exam:  Vitals:  03/28/22 1556 03/28/22 1754 03/28/22 1800 03/28/22 2021  BP:  136/76 121/70 (!) 144/84  Pulse:  69 67 68  Resp:   19 19  Temp: 97.7 F (36.5 C)  98 F (36.7 C) 97.8 F (36.6 C)  TempSrc: Oral   Oral  SpO2:  92% 97% 99%  Weight:      Height:        *** Constitutional: Awake alert and oriented x3, no associated distress.   Skin: no rashes, no lesions, good skin turgor noted. Eyes: Pupils are equally reactive to light.  No evidence of scleral icterus or conjunctival pallor.  ENMT: Moist mucous membranes noted.  Posterior pharynx clear of any exudate or lesions.   Neck: normal, supple, no masses, no thyromegaly.  No evidence of jugular venous distension.   Respiratory: clear to auscultation bilaterally, no wheezing, no crackles. Normal respiratory effort. No accessory muscle use.  Cardiovascular: Regular rate and rhythm, no murmurs / rubs / gallops.  No extremity edema. 2+ pedal pulses. No carotid bruits.  Chest:   Nontender without crepitus or deformity.   Back:   Nontender without crepitus or deformity. Abdomen: Abdomen is soft and nontender.  No evidence of intra-abdominal masses.  Positive bowel sounds noted in all quadrants.   Musculoskeletal: No joint deformity upper and lower extremities. Good ROM, no contractures. Normal muscle tone.  Neurologic: CN 2-12 grossly intact. Sensation intact.  Patient moving all 4 extremities spontaneously.  Patient is following all commands.  Patient is responsive to verbal stimuli.   Psychiatric: Patient exhibits normal mood with appropriate affect.  Patient seems to possess insight as to their current situation.    Data Reviewed:  I have personally reviewed and interpreted labs, imaging.  Significant findings are:  Chemistry revealing sodium 120, potassium 2.6, chloride 83, bicarbonate 26, BUN 15, creatinine 0.95. CBC revealing white blood cell count of 9.3, hemoglobin 9.1, hematocrit 26.5, platelet count 298 BNP 79.7 Magnesium 1.4 Urinalysis positive for nitrites with cloudy appearance with microscopy revealing 6-10 white blood cells per high-power field and many bacteria.  EKG: Personally reviewed.  Rhythm is normal sinus rhythm with heart rate of 74 bpm.  Notably prolonged QTc of 523 ms.  No dynamic ST segment changes appreciated.   *** Assessment and Plan: No notes have been filed under this hospital service. Service: Hospitalist      Code Status:  {Palliative Code status:23503}  code status decision has been confirmed with: *** Family Communication: ***   Consults: ***  Severity of Illness:  {Observation/Inpatient:21159}  Author:  Marinda Elk MD  03/28/2022 10:46 PM

## 2022-03-28 NOTE — ED Notes (Signed)
Pt ambulated BR using walker. Gait steady. EDP notified

## 2022-03-28 NOTE — Progress Notes (Signed)
Pt arrived to unit. Vitals stable. Pt on tele. Notified provider.

## 2022-03-28 NOTE — ED Notes (Signed)
[facility] at bedside 

## 2022-03-28 NOTE — ED Notes (Signed)
Attempted report x1, receiving RN unavailable at this time.  °

## 2022-03-28 NOTE — Progress Notes (Signed)
Lab called pt K+ 5.6. Stopped NS K+ drip. Notified provider. Provider wanted ECG done.

## 2022-03-28 NOTE — ED Provider Notes (Signed)
[facility] EMERGENCY DEPARTMENT Provider Note   CSN: [id number] Arrival date & time: 03/28/22  5277     History  Chief Complaint  Patient presents with   Fall    Catherine Hubbard is a 61 y.o. female.  HPI 61 year old female with a history of MS presents after a fall.  She fell twice last night.  The first time she was using her walker at around 10 PM and it felt like her legs gave out and she fell.  She was trying to get in bed from her wheelchair later this morning and then she fell again trying to transfer to the bed.  She feels tired and overall weak.  She has suffered bruising and some abrasions to her bilateral forearms though she states they do not feel painful and she is not concerned of any breaks.  Her primary complaint of pain is in her right ribs.  She did not hit her head, lose consciousness or pass out.  No dizziness preceding this.  No chest pain besides the right lateral ribs.  No shortness of breath.  No neck or back pain.  Also endorses bilateral lower extremity edema for about 3 days.  Today seems to be improved though they are still swollen compared to baseline.  She does endorse drinking alcohol last night.  In addition to MS she has multiple other comorbidities which includes alcohol abuse, GERD/esophagitis, hypertension and pancreatitis.  Home Medications Prior to Admission medications   Medication Sig Start Date End Date Taking? Authorizing Provider  modafinil (PROVIGIL) 200 MG tablet TAKE 1 TABLET BY MOUTH EVERY DAY 02/19/22   Sater, Pearletha Furl, MD  albuterol (PROVENTIL) (2.5 MG/3ML) 0.083% nebulizer solution Take 3 mLs (2.5 mg total) by nebulization every 6 (six) hours as needed for wheezing or shortness of breath. 11/26/19   Saguier, Ramon Dredge, PA-C  albuterol (VENTOLIN HFA) 108 (90 Base) MCG/ACT inhaler TAKE 2 PUFFS BY MOUTH EVERY 6 HOURS AS NEEDED FOR WHEEZE OR SHORTNESS OF BREATH 05/26/21   Saguier, Ramon Dredge, PA-C  amitriptyline (ELAVIL) 10 MG tablet  TAKE 1 TABLET BY MOUTH EVERYDAY AT BEDTIME 10/20/21   Raulkar, Drema Pry, MD  baclofen (LIORESAL) 10 MG tablet Take 1 tablet (10 mg total) by mouth 3 (three) times daily. 06/12/21   Lomax, Amy, NP  budesonide-formoterol (SYMBICORT) 160-4.5 MCG/ACT inhaler Inhale 2 puffs by mouth into the lungs 2 (two) times daily. 02/16/22   Saguier, Ramon Dredge, PA-C  budesonide-formoterol (SYMBICORT) 80-4.5 MCG/ACT inhaler TAKE 2 PUFFS BY MOUTH TWICE A DAY 02/05/22   Saguier, Ramon Dredge, PA-C  Cholecalciferol (D2000 ULTRA STRENGTH) 50 MCG (2000 UT) CAPS Take 1 capsule by mouth daily.     [provider]  COVID-19 mRNA bivalent vaccine, Pfizer, injection Inject into the muscle. 05/15/21   Judyann Munson, MD  diclofenac Sodium (VOLTAREN) 1 % GEL APPLY 4 G TOPICALLY 4 (FOUR) TIMES DAILY. Patient taking differently: Apply 4 g topically 4 (four) times daily. PRN 12/24/19   Raulkar, Drema Pry, MD  FLUoxetine (PROZAC) 40 MG capsule TAKE 1 CAPSULE BY MOUTH EVERY DAY 12/21/21   Raulkar, Drema Pry, MD  FLUoxetine (PROZAC) 40 MG capsule Take 1 capsule (40 mg total) by mouth daily. 02/05/22 02/05/23  Raulkar, Drema Pry, MD  fluticasone (FLONASE) 50 MCG/ACT nasal spray SPRAY 2 SPRAYS INTO EACH NOSTRIL EVERY DAY 10/16/21   Saguier, Ramon Dredge, PA-C  gabapentin (NEURONTIN) 100 MG capsule TAKE 1 CAPSULE (100 MG TOTAL) BY MOUTH 3 TIMES A DAY 10/16/21   Saguier, Ramon Dredge, PA-C  influenza vac split quadrivalent PF (FLUARIX) 0.5 ML injection Inject into the muscle. 05/15/21   Judyann Munson, MD  meloxicam (MOBIC) 7.5 MG tablet Take 1 tablet (7.5 mg total) by mouth daily. 01/09/22   Raulkar, Drema Pry, MD  Multiple Vitamin (MULTIVITAMIN WITH MINERALS) TABS tablet Take 1 tablet by mouth daily.    [provider]  OCREVUS 300 MG/10ML injection Inject 20 mLs (600 mg total) into the vein every 6 (six) months. 10/05/19   Lomax, Amy, NP  olmesartan (BENICAR) 5 MG tablet TAKE 2 TABLETS BY MOUTH EVERY DAY 12/04/21   Saguier, Ramon Dredge, PA-C  olmesartan  (BENICAR) 5 MG tablet 2 tab po daily 12/18/21   Saguier, Ramon Dredge, PA-C  omeprazole (PRILOSEC) 20 MG capsule Take by mouth. 07/09/20   [provider]  oxybutynin (DITROPAN) 5 MG tablet TAKE 1 TABLET BY MOUTH TWICE A DAY 07/17/21   Sater, Pearletha Furl, MD  predniSONE (DELTASONE) 20 MG tablet Take 2 tablets (40 mg total) by mouth daily. 12/05/21   Achille Rich, PA-C  sodium polystyrene (KAYEXALATE) 15 GM/60ML suspension Take 60 mLs (15 grams total) by mouth daily for 4 days. 12/20/21   Saguier, Ramon Dredge, PA-C  venlafaxine XR (EFFEXOR-XR) 37.5 MG 24 hr capsule TAKE 1 CAPSULE BY MOUTH DAILY WITH BREAKFAST. PATIENT NEEDS APPOINTMENT BEFORE ANY FURTHER REFILLS 10/20/21   Raulkar, Drema Pry, MD      Allergies    Ibuprofen and Nsaids    Review of Systems   Review of Systems  Constitutional:  Positive for fatigue.  Respiratory:  Negative for shortness of breath.   Cardiovascular:  Positive for leg swelling. Negative for chest pain.  Gastrointestinal:  Negative for abdominal pain.  Musculoskeletal:  Negative for back pain and neck pain.  Neurological:  Positive for weakness. Negative for syncope, light-headedness and headaches.    Physical Exam Updated Vital Signs BP 138/79   Pulse 72   Temp 98.4 F (36.9 C) (Oral)   Resp 18   Ht 5\' 3"  (1.6 m)   Wt 81.3 kg   LMP 08/13/2014 Comment: very few periods  SpO2 94%   BMI 31.76 kg/m  Physical Exam Vitals and nursing note reviewed.  Constitutional:      Appearance: She is well-developed.  HENT:     Head: Normocephalic and atraumatic.  Eyes:     Extraocular Movements: Extraocular movements intact.     Pupils: Pupils are equal, round, and reactive to light.  Cardiovascular:     Rate and Rhythm: Normal rate and regular rhythm.     Pulses:          Radial pulses are 2+ on the right side and 2+ on the left side.       Dorsalis pedis pulses are 2+ on the left side.     Heart sounds: Normal heart sounds.  Pulmonary:     Effort: Pulmonary effort  is normal.     Breath sounds: Normal breath sounds.    Chest:    Abdominal:     Palpations: Abdomen is soft.     Tenderness: There is no abdominal tenderness.  Musculoskeletal:     Cervical back: No tenderness. No spinous process tenderness.     Thoracic back: No tenderness.     Lumbar back: No tenderness.     Right lower leg: Edema present.     Left lower leg: Edema present.     Comments: Bilateral lower extremity pitting edema from feet to ankles to a few centimeters above the  ankles.  No obvious cellulitis. Both dorsal forearms have significant bruising and each has a small superficial abrasion.  Skin:    General: Skin is warm and dry.  Neurological:     Mental Status: She is alert and oriented to person, place, and time.     Comments: CN 3-12 grossly intact. 5/5 strength in all 4 extremities. Grossly normal sensation. Normal finger to nose.      ED Results / Procedures / Treatments   Labs (all labs ordered are listed, but only abnormal results are displayed) Labs Reviewed  COMPREHENSIVE METABOLIC PANEL - Abnormal; Notable for the following components:      Result Value   Sodium 120 (*)    Potassium 2.6 (*)    Chloride 83 (*)    Calcium 8.4 (*)    All other components within normal limits  CBC WITH DIFFERENTIAL/PLATELET - Abnormal; Notable for the following components:   RBC 3.13 (*)    Hemoglobin 9.1 (*)    HCT 26.5 (*)    Monocytes Absolute 1.2 (*)    All other components within normal limits  ETHANOL - Abnormal; Notable for the following components:   Alcohol, Ethyl (B) 208 (*)    All other components within normal limits  URINALYSIS, ROUTINE W REFLEX MICROSCOPIC - Abnormal; Notable for the following components:   APPearance CLOUDY (*)    Hgb urine dipstick TRACE (*)    Nitrite POSITIVE (*)    All other components within normal limits  MAGNESIUM - Abnormal; Notable for the following components:   Magnesium 1.4 (*)    All other components within normal limits   URINALYSIS, MICROSCOPIC (REFLEX) - Abnormal; Notable for the following components:   Bacteria, UA MANY (*)    All other components within normal limits  BASIC METABOLIC PANEL - Abnormal; Notable for the following components:   Sodium 121 (*)    Chloride 84 (*)    Calcium 8.6 (*)    All other components within normal limits  URINE CULTURE  BRAIN NATRIURETIC PEPTIDE  PROTIME-INR  PHOSPHORUS  SODIUM, URINE, RANDOM  BASIC METABOLIC PANEL  CBG MONITORING, ED    EKG EKG Interpretation  Date/Time:  Wednesday March 28 2022 11:50:42 EDT Ventricular Rate:  74 PR Interval:  40 QRS Duration: 121 QT Interval:  471 QTC Calculation: 523 R Axis:   50 Text Interpretation: Sinus rhythm Short PR interval Nonspecific intraventricular conduction delay similar to earlier in the day Confirmed by Pricilla Loveless [phone]) on 03/28/2022 12:04:02 PM  Radiology DG Ribs Unilateral W/Chest Right  Result Date: 03/28/2022 CLINICAL DATA:  Fall, right rib pain EXAM: RIGHT RIBS AND CHEST - 3+ VIEW COMPARISON:  05/24/2021 FINDINGS: There are chronic fracture deformities of the right second through eighth ribs. No acute fracture is seen involving the right ribs. There is a age indeterminate compression fracture of T8. There is no evidence of pneumothorax or pleural effusion. Both lungs are clear. Heart size and mediastinal contours are stable. IMPRESSION: 1. No acute fracture identified involving the right ribs. Chronic fracture deformities of the right second through eighth ribs. 2. Age indeterminate compression fracture of T8. Correlate with point tenderness. Electronically Signed   By: Duanne Guess D.O.   On: 03/28/2022 09:32    Procedures .Critical Care  Performed by: Pricilla Loveless, MD Authorized by: Pricilla Loveless, MD   Critical care provider statement:    Critical care time (minutes):  40   Critical care time was exclusive of:  Separately billable procedures and  treating other patients   Critical  care was necessary to treat or prevent imminent or life-threatening deterioration of the following conditions:  Metabolic crisis   Critical care was time spent personally by me on the following activities:  Development of treatment plan with patient or surrogate, discussions with consultants, evaluation of patient's response to treatment, examination of patient, ordering and review of laboratory studies, ordering and review of radiographic studies, ordering and performing treatments and interventions, pulse oximetry, re-evaluation of patient's condition and review of old charts     Medications Ordered in ED Medications  LORazepam (ATIVAN) injection 0-4 mg (0 mg Intravenous Hold 03/28/22 1337)    Or  LORazepam (ATIVAN) tablet 0-4 mg ( Oral See Alternative 03/28/22 1337)  LORazepam (ATIVAN) injection 0-4 mg (has no administration in time range)    Or  LORazepam (ATIVAN) tablet 0-4 mg (has no administration in time range)  thiamine (VITAMIN B1) tablet 100 mg (100 mg Oral Given 03/28/22 1343)    Or  thiamine (VITAMIN B1) injection 100 mg ( Intravenous See Alternative 03/28/22 1343)  0.9 % NaCl with KCl 20 mEq/ L  infusion ( Intravenous New Bag/Given 03/28/22 1408)  potassium chloride SA (KLOR-CON M) CR tablet 40 mEq (40 mEq Oral Given 03/28/22 1408)  fentaNYL (SUBLIMAZE) injection 50 mcg (50 mcg Intravenous Given 03/28/22 0910)  potassium chloride SA (KLOR-CON M) CR tablet 40 mEq (40 mEq Oral Given 03/28/22 1028)  potassium chloride 10 mEq in 100 mL IVPB (0 mEq Intravenous Stopped 03/28/22 1232)  magnesium sulfate IVPB 2 g 50 mL (0 g Intravenous Stopped 03/28/22 1233)  0.9 %  sodium chloride infusion (0 mLs Intravenous Stopped 03/28/22 1233)    ED Course/ Medical Decision Making/ A&P                           Medical Decision Making Amount and/or Complexity of Data Reviewed Labs: ordered. Radiology: ordered.  Risk OTC drugs. Prescription drug management. Decision regarding hospitalization.   Patient's  CXR viewed/interpreted by myself. No pneumothorax. No obvious fracture ribs. Otherwise has superficial trauma to arms.  Is not altered. No focal weakness. Doubt stroke/CNS emergency. Labwork shows hypokalemia and hypomagnesemia. Repletion started for both. However she also has significant hyponatremia down to 120. She received 125 mL of NS to help with IV potassium, otherwise fluids held. Has ankle swelling but otherwise doesn't appear volume overloaded. Is not significantly dry either. I suspect this is beer/nutritional.   She will be placed on CIWA with her ETOH abuse history. Discussed with Dr. Robb Matar who will admit to progressive unit.         Final Clinical Impression(s) / ED Diagnoses Final diagnoses:  Hyponatremia  Hypokalemia  Hypomagnesemia    Rx / DC Orders ED Discharge Orders     None         Pricilla Loveless, MD 03/28/22 1513

## 2022-03-28 NOTE — Assessment & Plan Note (Signed)
Continuing home regimen of daily PPI therapy.  

## 2022-03-28 NOTE — ED Notes (Signed)
Lab notified to add-on sodium, urine, random to previously collected urine sample.

## 2022-03-28 NOTE — Assessment & Plan Note (Addendum)
.   Patient has longstanding history of alcohol abuse . Patient is at high risk of impending alcohol withdrawal . Initiating CIWA protocol with tapering regimen of scheduled benzodiazepine . Patient will be administered additional doses of as needed benzodiazepines for evidence of withdrawal . Counseling patient on cessation

## 2022-03-28 NOTE — Telephone Encounter (Signed)
Pt in ED.  

## 2022-03-28 NOTE — Telephone Encounter (Signed)
Caller Name Harshita Bernales Caller Phone Number [phone] Patient Name Raeana Blinn Patient DOB [birth year] Call Type Message Only Information Provided Reason for Call Request for General Office Information Initial Comment Caller states she wants to leave a message for Dr. Dahlia Client. about her legs, ankles and feet has swollen really bad, not bad when walking around but when she lays down its bad. Additional Comment Caller states she would like a prescription called in and she declines triage. Disp. Time Disposition Final User 03/28/2022 12:16:31 AM General Information Provided Yes Maxine Glenn Call Closed By: Maxine Glenn Transaction Date/Time: 03/28/2022 12:12:07 AM (ET)

## 2022-03-28 NOTE — Assessment & Plan Note (Signed)
.   Resume patients home regimen of oral antihypertensives . Titrate antihypertensive regimen as necessary to achieve adequate BP control . PRN intravenous antihypertensives for excessively elevated blood pressure   

## 2022-03-28 NOTE — ED Triage Notes (Signed)
States had 2 falls today when trying to get up from wheelchair. C/o right rib pain. Denies hitting head, LOC or thinner use. Bruising to bilateral arms w/ abrasions.  Also states bilateral foot pain "for a while", edema noted.

## 2022-03-28 NOTE — Progress Notes (Signed)
Pt stated that she couldn't find her hearing aid. RT looked in her bed and on the floor and wasn't able to find her hearing aid.

## 2022-03-28 NOTE — Assessment & Plan Note (Signed)
   No clinical evidence of MS flare  Continue outpatient follow-up with neurology

## 2022-03-28 NOTE — Progress Notes (Signed)
Plan of Care Note for accepted transfer   Patient: Catherine Hubbard MRN: [id number]   DOA: 03/28/2022  Facility requesting transfer: [facility].  Requesting Provider: Pricilla Loveless, MD. Reason for transfer: Hyponatremia, hypokalemia, hypomagnesemia Facility course:  Per Dr. Criss Alvine: "  Fall      Catherine Hubbard is a 61 y.o. female.   HPI 61 year old female with a history of MS presents after a fall.  She fell twice last night.  The first time she was using her walker at around 10 PM and it felt like her legs gave out and she fell.  She was trying to get in bed from her wheelchair later this morning and then she fell again trying to transfer to the bed.  She feels tired and overall weak.  She has suffered bruising and some abrasions to her bilateral forearms though she states they do not feel painful and she is not concerned of any breaks.  Her primary complaint of pain is in her right ribs.  She did not hit her head, lose consciousness or pass out.  No dizziness preceding this.  No chest pain besides the right lateral ribs.  No shortness of breath.  No neck or back pain.   Also endorses bilateral lower extremity edema for about 3 days.  Today seems to be improved though they are still swollen compared to baseline.  She does endorse drinking alcohol last night.   In addition to MS she has multiple other comorbidities which includes alcohol abuse, GERD/esophagitis, hypertension and pancreatitis."   Urinalysis, Microscopic (reflex) [[id number]] (Abnormal)   Collected: 03/28/22 1110   Updated: 03/28/22 1150    RBC / HPF 6-10 RBC/hpf   WBC, UA 6-10 WBC/hpf   Bacteria, UA MANY Abnormal    Squamous Epithelial / LPF 0-5  Urinalysis, Routine w reflex microscopic Urine, Catheterized [[id number]] (Abnormal)   Collected: 03/28/22 1110   Updated: 03/28/22 1149   Specimen Source: Urine, Catheterized    Color, Urine YELLOW   APPearance CLOUDY Abnormal    Specific Gravity,  Urine <=1.005   pH 5.0   Glucose, UA NEGATIVE mg/dL   Hgb urine dipstick TRACE Abnormal    Bilirubin Urine NEGATIVE   Ketones, ur NEGATIVE mg/dL   Protein, ur NEGATIVE mg/dL   Nitrite POSITIVE Abnormal    Leukocytes,Ua NEGATIVE  Brain natriuretic peptide [[id number]]   Collected: 03/28/22 0845   Updated: 03/28/22 0933   Specimen Type: Blood    B Natriuretic Peptide 79.7 pg/mL  Magnesium [[id number]] (Abnormal)   Collected: 03/28/22 0845   Updated: 03/28/22 0921   Specimen Type: Blood    Magnesium 1.4 Low  mg/dL  Comprehensive metabolic panel [[id number]] (Abnormal)   Collected: 03/28/22 0845   Updated: 03/28/22 0919   Specimen Type: Blood    Sodium 120 Low  mmol/L   Potassium 2.6 Low Panic   mmol/L   Chloride 83 Low  mmol/L   CO2 26 mmol/L   Glucose, Bld 91 mg/dL   BUN 15 mg/dL   Creatinine, Ser 1.85 mg/dL   Calcium 8.4 Low  mg/dL   Total Protein 6.6 g/dL   Albumin 3.6 g/dL   AST 35 U/L   ALT 12 U/L   Alkaline Phosphatase 84 U/L   Total Bilirubin 0.4 mg/dL   GFR, Estimated >63 mL/min   Anion gap 11  Ethanol [[id number]] (Abnormal)   Collected: 03/28/22 0845   Updated: 03/28/22 0911   Specimen Type: Blood    Alcohol, Ethyl (B) 208  High  mg/dL  CBC with Differential [[id number]] (Abnormal)   Collected: 03/28/22 0845   Updated: 03/28/22 0857   Specimen Type: Blood    WBC 9.3 K/uL   RBC 3.13 Low  MIL/uL   Hemoglobin 9.1 Low  g/dL   HCT 38.4 Low  %   MCV 84.7 fL   MCH 29.1 pg   MCHC 34.3 g/dL   RDW 66.5 %   Platelets 298 K/uL   nRBC 0.0 %   Neutrophils Relative % 73 %   Neutro Abs 6.6 K/uL   Lymphocytes Relative 13 %   Lymphs Abs 1.2 K/uL   Monocytes Relative 13 %   Monocytes Absolute 1.2 High  K/uL   Eosinophils Relative 1 %   Eosinophils Absolute 0.1 K/uL   Basophils Relative 0 %   Basophils Absolute 0.0 K/uL   Immature Granulocytes 0 %   Abs Immature Granulocytes 0.04 K/uL  CBG monitoring, ED [[id number]]   Collected: 03/28/22 0846   Updated: 03/28/22  0848   Specimen Type: Blood    Glucose-Capillary 74 mg/dL    Plan of care: The patient is accepted for admission to Progressive unit, at [facility]..   Author: Bobette Mo, MD 03/28/2022  Check [email or web address] for on-call coverage.  Nursing staff, Please call [facility] Admits [facility] Consults [facility]-Wide number on Amion as soon as patient's arrival, so appropriate admitting provider can evaluate the pt.

## 2022-03-29 DIAGNOSIS — E871 Hypo-osmolality and hyponatremia: Secondary | ICD-10-CM | POA: Diagnosis not present

## 2022-03-29 LAB — CBC WITH DIFFERENTIAL/PLATELET
Abs Immature Granulocytes: 0.02 10*3/uL (ref 0.00–0.07)
Basophils Absolute: 0 10*3/uL (ref 0.0–0.1)
Basophils Relative: 1 %
Eosinophils Absolute: 0.1 10*3/uL (ref 0.0–0.5)
Eosinophils Relative: 2 %
HCT: 30.9 % — ABNORMAL LOW (ref 36.0–46.0)
Hemoglobin: 9.8 g/dL — ABNORMAL LOW (ref 12.0–15.0)
Immature Granulocytes: 0 %
Lymphocytes Relative: 11 %
Lymphs Abs: 0.7 10*3/uL (ref 0.7–4.0)
MCH: 28.7 pg (ref 26.0–34.0)
MCHC: 31.7 g/dL (ref 30.0–36.0)
MCV: 90.6 fL (ref 80.0–100.0)
Monocytes Absolute: 1.1 10*3/uL — ABNORMAL HIGH (ref 0.1–1.0)
Monocytes Relative: 18 %
Neutro Abs: 4.4 10*3/uL (ref 1.7–7.7)
Neutrophils Relative %: 68 %
Platelets: 308 10*3/uL (ref 150–400)
RBC: 3.41 MIL/uL — ABNORMAL LOW (ref 3.87–5.11)
RDW: 15.6 % — ABNORMAL HIGH (ref 11.5–15.5)
WBC: 6.5 10*3/uL (ref 4.0–10.5)
nRBC: 0 % (ref 0.0–0.2)

## 2022-03-29 LAB — BASIC METABOLIC PANEL
Anion gap: 11 (ref 5–15)
Anion gap: 7 (ref 5–15)
BUN: 9 mg/dL (ref 8–23)
BUN: 9 mg/dL (ref 8–23)
CO2: 24 mmol/L (ref 22–32)
CO2: 28 mmol/L (ref 22–32)
Calcium: 8.6 mg/dL — ABNORMAL LOW (ref 8.9–10.3)
Calcium: 8.7 mg/dL — ABNORMAL LOW (ref 8.9–10.3)
Chloride: 93 mmol/L — ABNORMAL LOW (ref 98–111)
Chloride: 94 mmol/L — ABNORMAL LOW (ref 98–111)
Creatinine, Ser: 0.77 mg/dL (ref 0.44–1.00)
Creatinine, Ser: 0.69 mg/dL (ref 0.44–1.00)
GFR, Estimated: >60 mL/min (ref >60)
GFR, Estimated: >60 mL/min (ref >60)
Glucose, Bld: 82 mg/dL (ref 70–99)
Glucose, Bld: 94 mg/dL (ref 70–99)
Potassium: 5.1 mmol/L (ref 3.5–5.1)
Potassium: 4.6 mmol/L (ref 3.5–5.1)
Sodium: 128 mmol/L — ABNORMAL LOW (ref 135–145)
Sodium: 129 mmol/L — ABNORMAL LOW (ref 135–145)

## 2022-03-29 LAB — VITAMIN B12: Vitamin B-12: 682 pg/mL (ref 180–914)

## 2022-03-29 LAB — MAGNESIUM: Magnesium: 1.9 mg/dL (ref 1.7–2.4)

## 2022-03-29 LAB — FOLATE: Folate: >40 ng/mL (ref >5.9)

## 2022-03-29 LAB — CK: Total CK: 199 U/L (ref 38–234)

## 2022-03-29 LAB — CORTISOL: Cortisol, Plasma: 7 ug/dL

## 2022-03-29 LAB — TSH: TSH: 2.55 u[IU]/mL (ref 0.350–4.500)

## 2022-03-29 LAB — HIV ANTIBODY (ROUTINE TESTING W REFLEX): HIV Screen 4th Generation wRfx: NONREACTIVE

## 2022-03-29 MED ORDER — SODIUM CHLORIDE 0.9 % IV SOLN
1.0000 g | INTRAVENOUS | Status: DC
  Administered 2022-03-29: 1 g via INTRAVENOUS
  Filled 2022-03-29 (×2): qty 10

## 2022-03-29 MED ORDER — ALUM & MAG HYDROXIDE-SIMETH 200-200-20 MG/5ML PO SUSP
30.0000 mL | ORAL | Status: DC | PRN
  Administered 2022-03-29: 30 mL via ORAL
  Filled 2022-03-29: qty 30

## 2022-03-29 NOTE — Assessment & Plan Note (Signed)
Replacing with intravenous magnesium sulfate Monitoring magnesium levels with serial chemistries.   

## 2022-03-29 NOTE — Assessment & Plan Note (Addendum)
   Patient initially presented to the emergency department with substantial hypokalemia of 2.6 however upon arrival to the medical floor patient is now 5.6 and is hyperkalemic  Holding intravenous potassium chloride supplementation upon arrival to medical floor  EKG reveals no dynamic change  Administering a dose of Lokelma  Obtaining a creatine kinase level considering patient's traumatic injury  Monitoring patient on telemetry  Monitoring potassium levels closely with serial chemistries

## 2022-03-29 NOTE — Evaluation (Signed)
Physical Therapy Evaluation Patient Details Name: Catherine Hubbard MRN: [id number] DOB: June 12, 1961 Today's Date: 03/29/2022  History of Present Illness  61 year old female with past medical history of alcohol abuse, relapsing remitting multiple sclerosis (follows with [facility], on Ocrevus infusions), gastroesophageal reflux disease, asthma, hypertension depression, anxiety disorder who presented status post fall found to have multiple electrolyte derangements.  Clinical Impression  The patient  is seated in recliner, spouse  present. Patient ambulated x 20' x 2 using Rw and min guard. Gait steady. Patient should progress to return home with spouse support. Patient agreeable to HHPT. Pt admitted with above diagnosis.  Pt currently with functional limitations due to the deficits listed below (see PT Problem List). Pt will benefit from skilled PT to increase their independence and safety with mobility to allow discharge to the venue listed below.        Recommendations for follow up therapy are one component of a multi-disciplinary discharge planning process, led by the attending physician.  Recommendations may be updated based on patient status, additional functional criteria and insurance authorization.  Follow Up Recommendations Home health PT      Assistance Recommended at Discharge Intermittent Supervision/Assistance  Patient can return home with the following  A little help with bathing/dressing/bathroom;Help with stairs or ramp for entrance;Assist for transportation;Assistance with cooking/housework;A little help with walking and/or transfers    Equipment Recommendations None recommended by PT  Recommendations for Other Services       Functional Status Assessment Patient has had a recent decline in their functional status and demonstrates the ability to make significant improvements in function in a reasonable and predictable amount of time.     Precautions /  Restrictions Precautions Precautions: Fall Restrictions Weight Bearing Restrictions: No      Mobility  Bed Mobility               General bed mobility comments: in recliner    Transfers   Equipment used: Rolling walker (2 wheels) Transfers: Sit to/from Stand Sit to Stand: Min guard           General transfer comment: min guard from recliner and toilet using rail    Ambulation/Gait Ambulation/Gait assistance: Min guard Gait Distance (Feet): 20 Feet (x 2) Assistive device: Rolling walker (2 wheels) Gait Pattern/deviations: Step-through pattern, Decreased stride length Gait velocity: decr     General Gait Details: patient steady with RW, manages to turn and move around objects.  Stairs            Wheelchair Mobility    Modified Rankin (Stroke Patients Only)       Balance Overall balance assessment: Needs assistance, History of Falls Sitting-balance support: Feet supported, No upper extremity supported Sitting balance-Leahy Scale: Good     Standing balance support: During functional activity, Single extremity supported Standing balance-Leahy Scale: Fair                               Pertinent Vitals/Pain Pain Assessment Pain Location: forearm abrasions Pain Descriptors / Indicators: Grimacing, Guarding Pain Intervention(s): Monitored during session    Home Living Family/patient expects to be discharged to:: Private residence Living Arrangements: Spouse/significant other Available Help at Discharge: Family;Available 24 hours/day Type of Home: House Home Access: Stairs to enter Entrance Stairs-Rails: None Entrance Stairs-Number of Steps: 1+1   Home Layout: One level Home Equipment: Teacher, English as a foreign language (2 wheels);Rollator (4 wheels);Hand held shower head;Wheelchair - manual;Grab bars - tub/shower  Prior Function Prior Level of Function : Independent/Modified Independent             Mobility Comments: uses walker  and wc intermitently as needed ADLs Comments: modified independent, stands in shower     Hand Dominance   Dominant Hand: Right    Extremity/Trunk Assessment   Upper Extremity Assessment Upper Extremity Assessment: Overall WFL for tasks assessed    Lower Extremity Assessment Lower Extremity Assessment: Generalized weakness    Cervical / Trunk Assessment Cervical / Trunk Assessment: Normal  Communication   Communication: HOH  Cognition Arousal/Alertness: Awake/alert Behavior During Therapy: WFL for tasks assessed/performed Overall Cognitive Status: Within Functional Limits for tasks assessed                                          General Comments      Exercises     Assessment/Plan    PT Assessment Patient needs continued PT services  PT Problem List Decreased strength;Decreased mobility;Decreased activity tolerance;Decreased knowledge of use of DME;Decreased safety awareness       PT Treatment Interventions DME instruction;Therapeutic activities;Gait training;Therapeutic exercise;Patient/family education;Functional mobility training    PT Goals (Current goals can be found in the Care Plan section)  Acute Rehab PT Goals Patient Stated Goal: agreeable to ambulate PT Goal Formulation: With patient/family Time For Goal Achievement: 04/12/22 Potential to Achieve Goals: Good    Frequency Min 3X/week     Co-evaluation               AM-PAC PT "6 Clicks" Mobility  Outcome Measure Help needed turning from your back to your side while in a flat bed without using bedrails?: A Little Help needed moving from lying on your back to sitting on the side of a flat bed without using bedrails?: A Little Help needed moving to and from a bed to a chair (including a wheelchair)?: A Little Help needed standing up from a chair using your arms (e.g., wheelchair or bedside chair)?: A Little Help needed to walk in hospital room?: A Little Help needed climbing  3-5 steps with a railing? : A Lot 6 Click Score: 17    End of Session   Activity Tolerance: Patient tolerated treatment well Patient left: in chair;with call bell/phone within reach;with chair alarm set Nurse Communication: Mobility status PT Visit Diagnosis: Unsteadiness on feet (R26.81);History of falling (Z91.81);Difficulty in walking, not elsewhere classified (R26.2)    Time: 1448-1856 PT Time Calculation (min) (ACUTE ONLY): 19 min   Charges:   PT Evaluation $PT Eval Low Complexity: 1 Low          Blanchard Kelch PT Acute Rehabilitation Services Office [phone] Weekend [phone]   Rada Hay 03/29/2022, 2:55 PM

## 2022-03-29 NOTE — Assessment & Plan Note (Addendum)
   Patient exhibiting modest hyponatremia with complaints of weakness and frequent falls   Etiology of hyponatremia is not entirely clear.  Patient clinically appears euvolemic.  Patient also admits to heavy alcohol use but is unwilling to quantify how much.  This is possibly a euvolemic hyponatremia secondary to excessive alcohol use  Unfortunately serum and urine osmolality was not obtained yesterday at the onset.  Obtaining TSH.  Obtaining cortisol although adrenal abnormalities are unlikely  Patient has already received a substantial amount of intravenous saline in the emergency department prior to arrival to medical floor and this has immediately been discontinued.  Trending sodium levels with serial chemistries, Target rate of correction should not exceed 8-10 in a 24-hour period

## 2022-03-29 NOTE — Progress Notes (Signed)
PROGRESS NOTE    MERL GUARDINO  PJA:[id number] DOB: Dec 19, 1960 DOA: 03/28/2022 PCP: [name], PA-C     Brief Narrative:  Catherine Hubbard is a 61 year old female with past medical history of alcohol abuse, relapsing remitting multiple sclerosis (follows with [facility], on Ocrevus infusions), gastroesophageal reflux disease, asthma, hypertension depression, anxiety disorder who presented to [facility] as a transfer from med [facility] emergency department after presenting status post fall found to have multiple electrolyte derangements.  Patient explains that she has been feeling a generalized malaise and weakness over the past several days.  The evening of 8/8 the patient states that she was using her walker at approximately 10 PM when she felt that her legs were giving out and she fell to the ground.  The following morning she states that she fell to the ground from her wheelchair attempting to transfer into her own bed.  During that the second fall patient suffered some abrasions to her bilateral forearms.  Upon evaluation in the emergency department rib x-rays revealed no evidence of acute fracture with the exception of an age-indeterminate compression fracture of T8.  Workup did identify substantial hyponatremia of 120 with substantial hypokalemia of 2.6 and substantial hypomagnesemia of 1.4.    New events last 24 hours / Subjective: Patient reports feeling crummy overall and very weak.  Also admits to intermittent swelling of her feet, states that it looks much better today  Assessment & Plan:   Principal Problem:   Hyponatremia Active Problems:   Potassium disorder   Hypomagnesemia   Frequent falls   Alcohol abuse   Essential hypertension   Relapsing remitting multiple sclerosis (HCC)   GERD without esophagitis   Hyponatremia -Appears to be euvolemic, does have history of alcohol use. ?Prozac, Effexor and ARB contributing  -TSH normal   -Sodium 120 >> 121 >> 130 >> 128 -Monitor off IVF   E Coli UTI -Rocephin   Frequent falls, hx of MS -PT OT   Alcohol abuse -CIWA   HTN -Hold ARB due to hyponatremia  Hypomagnesemia -Resolved  Hypokalemia -Resolved   DVT prophylaxis:  enoxaparin (LOVENOX) injection 40 mg Start: 03/29/22 1000  Code Status: Full Family Communication: None at bedside Disposition Plan:  Status is: Observation The patient will require care spanning > 2 midnights and should be moved to inpatient because: Continue to monitor sodium levels, needs PT OT evaluation   Antimicrobials:  Anti-infectives (From admission, onward)    None        Objective: Vitals:   03/28/22 2021 03/29/22 0044 03/29/22 0527 03/29/22 0855  BP: (!) 144/84 (!) 153/90 (!) 142/77   Pulse: 68 75 92   Resp: 19 15 17    Temp: 97.8 F (36.6 C) 98.1 F (36.7 C) 98.3 F (36.8 C)   TempSrc: Oral     SpO2: 99% 98% 99% 96%  Weight:      Height:        Intake/Output Summary (Last 24 hours) at 03/29/2022 1405 Last data filed at 03/29/2022 0504 Gross per 24 hour  Intake 656.44 ml  Output 1850 ml  Net -1193.56 ml   Filed Weights   03/28/22 0738  Weight: 81.3 kg    Examination:  General exam: Appears calm and comfortable  Respiratory system: Clear to auscultation. Respiratory effort normal. No respiratory distress. No conversational dyspnea.  Cardiovascular system: S1 & S2 heard, RRR. No murmurs. No pedal edema. Gastrointestinal system: Abdomen is nondistended, soft and nontender. Normal bowel sounds heard.  Central nervous system: Alert and oriented. No focal neurological deficits. Speech clear.  Extremities: Symmetric in appearance  Skin: No rashes, lesions or ulcers on exposed skin  Psychiatry: Judgement and insight appear normal. Mood & affect appropriate.   Data Reviewed: I have personally reviewed following labs and imaging studies  CBC: Recent Labs  Lab 03/28/22 0845 03/29/22 0358  WBC 9.3 6.5   NEUTROABS 6.6 4.4  HGB 9.1* 9.8*  HCT 26.5* 30.9*  MCV 84.7 90.6  PLT 298 308   Basic Metabolic Panel: Recent Labs  Lab 03/28/22 0845 03/28/22 1348 03/28/22 2143 03/29/22 0358 03/29/22 1006  NA 120* 121* 130* 128* 129*  K 2.6* 3.5 5.6* 5.1 4.6  CL 83* 84* 94* 93* 94*  CO2 26 28 28 24 28   GLUCOSE 91 83 91 82 94  BUN 15 13 10 9 9   CREATININE 0.95 0.78 0.70 0.77 0.69  CALCIUM 8.4* 8.6* 9.1 8.6* 8.7*  MG 1.4*  --   --  1.9  --   PHOS  --  2.7  --   --   --    GFR: Estimated Creatinine Clearance: 74.6 mL/min (by C-G formula based on SCr of 0.69 mg/dL). Liver Function Tests: Recent Labs  Lab 03/28/22 0845  AST 35  ALT 12  ALKPHOS 84  BILITOT 0.4  PROT 6.6  ALBUMIN 3.6   No results for input(s): "LIPASE", "AMYLASE" in the last 168 hours. No results for input(s): "AMMONIA" in the last 168 hours. Coagulation Profile: Recent Labs  Lab 03/28/22 1348  INR 1.0   Cardiac Enzymes: Recent Labs  Lab 03/29/22 1244  CKTOTAL 199   BNP (last 3 results) No results for input(s): "PROBNP" in the last 8760 hours. HbA1C: No results for input(s): "HGBA1C" in the last 72 hours. CBG: Recent Labs  Lab 03/28/22 0846  GLUCAP 74   Lipid Profile: No results for input(s): "CHOL", "HDL", "LDLCALC", "TRIG", "CHOLHDL", "LDLDIRECT" in the last 72 hours. Thyroid Function Tests: Recent Labs    03/29/22 1243  TSH 2.550   Anemia Panel: No results for input(s): "VITAMINB12", "FOLATE", "FERRITIN", "TIBC", "IRON", "RETICCTPCT" in the last 72 hours. Sepsis Labs: No results for input(s): "PROCALCITON", "LATICACIDVEN" in the last 168 hours.  Recent Results (from the past 240 hour(s))  Urine Culture     Status: Abnormal (Preliminary result)   Collection Time: 03/28/22 11:10 AM   Specimen: In/Out Cath Urine  Result Value Ref Range Status   Specimen Description   Final    IN/OUT CATH URINE Performed at [facility], [address] [place], [address] [place]    Special  Requests   Final    NONE Performed at [facility], [address] [place], [address] [place]    Culture (A)  Final    30,000 COLONIES/mL ESCHERICHIA COLI SUSCEPTIBILITIES TO FOLLOW Performed at [facility], [address], [place], [address] [place]    Report Status PENDING  Incomplete      Radiology Studies: DG Ribs Unilateral W/Chest Right  Result Date: 03/28/2022 CLINICAL DATA:  Fall, right rib pain EXAM: RIGHT RIBS AND CHEST - 3+ VIEW COMPARISON:  05/24/2021 FINDINGS: There are chronic fracture deformities of the right second through eighth ribs. No acute fracture is seen involving the right ribs. There is a age indeterminate compression fracture of T8. There is no evidence of pneumothorax or pleural effusion. Both lungs are clear. Heart size and mediastinal contours are stable. IMPRESSION: 1. No acute  fracture identified involving the right ribs. Chronic fracture deformities of the right second through eighth ribs. 2. Age indeterminate compression fracture of T8. Correlate with point tenderness. Electronically Signed   By: Duanne Guess D.O.   On: 03/28/2022 09:32      Scheduled Meds:  enoxaparin (LOVENOX) injection  40 mg Subcutaneous Q24H   FLUoxetine  40 mg Oral Daily   fluticasone furoate-vilanterol  1 puff Inhalation Daily   folic acid  1 mg Oral Daily   irbesartan  75 mg Oral Daily   LORazepam  0-4 mg Intravenous Q6H   Or   LORazepam  0-4 mg Oral Q6H   [START ON 03/30/2022] LORazepam  0-4 mg Intravenous Q12H   Or   [START ON 03/30/2022] LORazepam  0-4 mg Oral Q12H   multivitamin with minerals  1 tablet Oral Daily   pantoprazole  40 mg Oral Daily   thiamine  100 mg Oral Daily   Or   thiamine  100 mg Intravenous Daily   venlafaxine XR  37.5 mg Oral Q breakfast   Continuous Infusions:   LOS: 0 days     Noralee Stain, DO [facility] 03/29/2022, 2:05 PM   Available via Epic secure chat 7am-7pm After these hours, please refer to  coverage provider listed on [email or web address]

## 2022-03-29 NOTE — Assessment & Plan Note (Signed)
   Patient has longstanding history of frequent falls and numerous injuries  Causes likely multifactorial secondary to bouts of hyponatremia in addition to the likely development of peripheral polyneuropathy in the setting of ongoing alcohol use  PT evaluation ordered  Correcting electrolyte abnormalities as above

## 2022-03-29 NOTE — Evaluation (Signed)
Occupational Therapy Evaluation Patient Details Name: Catherine Hubbard MRN: [id number] DOB: 09/28/1960 Today's Date: 03/29/2022   History of Present Illness 61 year old female with past medical history of alcohol abuse, relapsing remitting multiple sclerosis (follows with [facility], on Ocrevus infusions), gastroesophageal reflux disease, asthma, hypertension depression, anxiety disorder who presented status post fall found to have multiple electrolyte derangements.   Clinical Impression   Catherine Hubbard is a 61 year old woman who presents mostly stiff and in pain from fall. She was supervision to transfer to edge of bed and min guard to stand and transfer to chair. She needed increased assistance for LB ADLs due to rib pain. She has family assistance and all needed DME at home. She will need increased assistance at home until discomfort and soreness subsides but do not expect she will need OT services. Will follow acutely to make sure she maintains physical abilities while in hospital.      Recommendations for follow up therapy are one component of a multi-disciplinary discharge planning process, led by the attending physician.  Recommendations may be updated based on patient status, additional functional criteria and insurance authorization.   Follow Up Recommendations  No OT follow up    Assistance Recommended at Discharge Intermittent Supervision/Assistance  Patient can return home with the following A little help with bathing/dressing/bathroom;Assistance with cooking/housework    Functional Status Assessment  Patient has had a recent decline in their functional status and demonstrates the ability to make significant improvements in function in a reasonable and predictable amount of time.  Equipment Recommendations  None recommended by OT    Recommendations for Other Services       Precautions / Restrictions Precautions Precautions:  Fall Restrictions Weight Bearing Restrictions: No      Mobility Bed Mobility Overal bed mobility: Needs Assistance Bed Mobility: Supine to Sit     Supine to sit: Supervision, HOB elevated          Transfers Overall transfer level: Needs assistance Equipment used: Rolling walker (2 wheels) Transfers: Sit to/from Stand, Bed to chair/wheelchair/BSC Sit to Stand: Min guard     Step pivot transfers: Min guard            Balance Overall balance assessment: History of Falls                                         ADL either performed or assessed with clinical judgement   ADL Overall ADL's : Needs assistance/impaired Eating/Feeding: Independent   Grooming: Set up;Sitting   Upper Body Bathing: Set up;Sitting   Lower Body Bathing: Minimal assistance;Sit to/from stand Lower Body Bathing Details (indicate cue type and reason): difficulty reaching feet due to rib pain Upper Body Dressing : Set up;Sitting   Lower Body Dressing: Moderate assistance;Sit to/from stand Lower Body Dressing Details (indicate cue type and reason): difficulty reaching feet due to rib pain Toilet Transfer: Min guard;BSC/3in1;Rolling walker (2 wheels)   Toileting- Clothing Manipulation and Hygiene: Supervision/safety;Sit to/from stand       Functional mobility during ADLs: Min guard;Rolling walker (2 wheels)       Vision Baseline Vision/History: 1 Wears glasses Ability to See in Adequate Light: 1 Impaired Patient Visual Report: No change from baseline       Perception     Praxis      Pertinent Vitals/Pain Pain Assessment Pain Assessment: 0-10 Pain Score: 9  Pain Location: right ribs Pain Descriptors / Indicators: Grimacing, Guarding Pain Intervention(s): Monitored during session     Hand Dominance Right   Extremity/Trunk Assessment Upper Extremity Assessment Upper Extremity Assessment: Overall WFL for tasks assessed (WFL ROM, 5/5 elbow strength but unable to  test shoulders. Abrasions to bilateral forearms. Reports pain in right shoulder)       Cervical / Trunk Assessment Cervical / Trunk Assessment: Normal   Communication Communication Communication: HOH   Cognition Arousal/Alertness: Awake/alert Behavior During Therapy: WFL for tasks assessed/performed Overall Cognitive Status: Within Functional Limits for tasks assessed                                       General Comments       Exercises     Shoulder Instructions      Home Living Family/patient expects to be discharged to:: Private residence Living Arrangements: Spouse/significant other Available Help at Discharge: Family;Available 24 hours/day (roommates as well) Type of Home: House Home Access: Stairs to enter [employer] of Steps: 1+1 Entrance Stairs-Rails: None Home Layout: One level     Bathroom Shower/Tub: Chief Strategy Officer: Standard     Home Equipment: Teacher, English as a foreign language (2 wheels);Rollator (4 wheels);Hand held shower head;Wheelchair - manual;Grab bars - tub/shower          Prior Functioning/Environment Prior Level of Function : Independent/Modified Independent             Mobility Comments: uses walker and wc intermitently as needed ADLs Comments: modified independent, stands in shower        OT Problem List: Decreased activity tolerance;Impaired balance (sitting and/or standing);Pain;Decreased strength;Decreased knowledge of use of DME or AE      OT Treatment/Interventions: Self-care/ADL training;Therapeutic exercise;DME and/or AE instruction;Therapeutic activities;Balance training;Patient/family education    OT Goals(Current goals can be found in the care plan section) Acute Rehab OT Goals Patient Stated Goal: less pain Time For Goal Achievement: 04/12/22 Potential to Achieve Goals: Good  OT Frequency: Min 2X/week    Co-evaluation              AM-PAC OT "6 Clicks" Daily Activity      Outcome Measure Help from another person eating meals?: None Help from another person taking care of personal grooming?: A Little Help from another person toileting, which includes using toliet, bedpan, or urinal?: A Little Help from another person bathing (including washing, rinsing, drying)?: A Little Help from another person to put on and taking off regular upper body clothing?: A Little Help from another person to put on and taking off regular lower body clothing?: A Lot 6 Click Score: 18   End of Session Equipment Utilized During Treatment: Rolling walker (2 wheels) Nurse Communication: Mobility status  Activity Tolerance: Patient tolerated treatment well Patient left: in chair;with call bell/phone within reach;with chair alarm set  OT Visit Diagnosis: History of falling (Z91.81)                Time: 1030-1047 OT Time Calculation (min): 17 min Charges:  OT General Charges $OT Visit: 1 Visit OT Evaluation $OT Eval Low Complexity: 1 Low  Catherine Hubbard, OTR/L Acute Care Rehab Services  Office [phone] Pager: [phone]   Kelli Churn 03/29/2022, 12:55 PM

## 2022-03-30 DIAGNOSIS — E871 Hypo-osmolality and hyponatremia: Secondary | ICD-10-CM | POA: Diagnosis not present

## 2022-03-30 LAB — URINE CULTURE: Culture: 30000 — AB

## 2022-03-30 LAB — BASIC METABOLIC PANEL
Anion gap: 8 (ref 5–15)
BUN: 9 mg/dL (ref 8–23)
CO2: 28 mmol/L (ref 22–32)
Calcium: 8.8 mg/dL — ABNORMAL LOW (ref 8.9–10.3)
Chloride: 97 mmol/L — ABNORMAL LOW (ref 98–111)
Creatinine, Ser: 0.7 mg/dL (ref 0.44–1.00)
GFR, Estimated: >60 mL/min (ref >60)
Glucose, Bld: 91 mg/dL (ref 70–99)
Potassium: 4.6 mmol/L (ref 3.5–5.1)
Sodium: 133 mmol/L — ABNORMAL LOW (ref 135–145)

## 2022-03-30 LAB — MAGNESIUM: Magnesium: 1.9 mg/dL (ref 1.7–2.4)

## 2022-03-30 MED ORDER — CEPHALEXIN 500 MG PO CAPS
500.0000 mg | ORAL_CAPSULE | Freq: Two times a day (BID) | ORAL | Status: DC
  Administered 2022-03-30: 500 mg via ORAL
  Filled 2022-03-30: qty 1

## 2022-03-30 MED ORDER — CEPHALEXIN 500 MG PO CAPS: 500.0000 mg | ORAL_CAPSULE | Freq: Two times a day (BID) | ORAL | 0 refills | Status: AC

## 2022-03-30 NOTE — Progress Notes (Signed)
Patient discharged home.  Discharge instructions explained, patient verbalizes understanding 

## 2022-03-30 NOTE — TOC Transition Note (Signed)
Transition of Care [facility]) - CM/SW Discharge Note   Patient Details  Name: Catherine Hubbard MRN: [id number] Date of Birth: [birth year]  [facility] CM/SW Contact:  Golda Acre, RN Phone Number: 03/30/2022, 1:25 PM   Clinical Narrative:    Spoke with husband informed him of the hhc no agency will take the bcbs federal [employer].  Will call the pcp and get outpt orders for pt and ot if the patient will agree to go.   Final next level of care: Home/Self Care Barriers to Discharge: No Home Care Agency will accept this patient   Patient Goals and CMS Choice Patient states their goals for this hospitalization and ongoing recovery are:: i want to go back home CMS Medicare.gov Compare Post Acute Care list provided to:: Patient Choice offered to / list presented to : Patient  Discharge Placement                       Discharge Plan and Services   Discharge Planning Services: CM Consult                                 Social Determinants of Health (SDOH) Interventions     Readmission Risk Interventions     No data to display

## 2022-03-30 NOTE — Discharge Summary (Signed)
Physician Discharge Summary  Catherine Hubbard FYB:[id number] DOB: 09/02/1960 DOA: 03/28/2022  PCP: [name], PA-C  Admit date: 03/28/2022 Discharge date: 03/30/2022  Admitted From: Home Disposition:  Home with home health   Recommendations for Outpatient Follow-up:  Follow up with PCP in 1 week. Recheck BMP for Na level.  Follow up with [facility] Clinic to ask about weaning down/off Prozac and Effexor   Discharge Condition: Stable CODE STATUS: Full  Diet recommendation:  Diet Orders (From admission, onward)     Start     Ordered   03/29/22 0938  Diet regular Room service appropriate? Yes; Fluid consistency: Thin  Diet effective now       Question Answer Comment  Room service appropriate? Yes   Fluid consistency: Thin      03/29/22 5277           Brief/Interim Summary: Catherine Hubbard is a 61 year old female with past medical history of alcohol abuse, relapsing remitting multiple sclerosis (follows with [facility], on Ocrevus infusions), gastroesophageal reflux disease, asthma, hypertension depression, anxiety disorder who presented to [facility] as a transfer from med [facility] emergency department after presenting status post fall found to have multiple electrolyte derangements.  Patient explains that she has been feeling a generalized malaise and weakness over the past several days.  The evening of 8/8 the patient states that she was using her walker at approximately 10 PM when she felt that her legs were giving out and she fell to the ground.  The following morning she states that she fell to the ground from her wheelchair attempting to transfer into her own bed.  During that the second fall patient suffered some abrasions to her bilateral forearms.  Upon evaluation in the emergency department rib x-rays revealed no evidence of acute fracture with the exception of an age-indeterminate compression fracture of T8.  Workup did identify substantial  hyponatremia of 120 with substantial hypokalemia of 2.6 and substantial hypomagnesemia of 1.4.    Patient initially received IV fluids with rapid improvement of sodium.  IV fluid was stopped and sodium levels continued to improve.  Hyponatremia was thought to be secondary to potomania with alcohol use, or medication effects due to Prozac, Effexor, ARB use.  Patient counseled on alcohol cessation.  ARB discontinued.  Patient to follow-up with outpatient for weaning down or off Prozac and Effexor.  Antibiotic switched to Keflex for UTI treatment.  PT OT recommended home health therapies.  Discharge Diagnoses:   Principal Problem:   Hyponatremia Active Problems:   Potassium disorder   Hypomagnesemia   Frequent falls   Alcohol abuse   Essential hypertension   Relapsing remitting multiple sclerosis (HCC)   GERD without esophagitis   Hyponatremia -Appears to be euvolemic, does have history of alcohol use. ?Prozac, Effexor and ARB contributing  -TSH normal  -Sodium 120 >> 121 >> 130 >> 128 >> 133   E Coli UTI -Rocephin --> Keflex   Frequent falls, hx of MS -PT OT recommend home health   Alcohol abuse -Encourage cessation   HTN -Hold ARB due to hyponatremia   Hypomagnesemia -Resolved   Hypokalemia -Resolved  Discharge Instructions  Discharge Instructions     Call MD for:  difficulty breathing, headache or visual disturbances   Complete by: As directed    Call MD for:  extreme fatigue   Complete by: As directed    Call MD for:  persistant dizziness or light-headedness   Complete by: As directed  Call MD for:  persistant nausea and vomiting   Complete by: As directed    Call MD for:  severe uncontrolled pain   Complete by: As directed    Call MD for:  temperature >100.4   Complete by: As directed    Discharge instructions   Complete by: As directed    You were cared for by a hospitalist during your hospital stay. If you have any questions about your discharge  medications or the care you received while you were in the hospital after you are discharged, you can call the unit and ask to speak with the hospitalist on call if the hospitalist that took care of you is not available. Once you are discharged, your primary care physician will handle any further medical issues. Please note that NO REFILLS for any discharge medications will be authorized once you are discharged, as it is imperative that you return to your primary care physician (or establish a relationship with a primary care physician if you do not have one) for your aftercare needs so that they can reassess your need for medications and monitor your lab values.   Increase activity slowly   Complete by: As directed       Allergies as of 03/30/2022       Reactions   Ibuprofen Other (See Comments)   Cannot take due to gastric bypass   Nsaids Other (See Comments)   Gastric bypass        Medication List     STOP taking these medications    Fluarix Quadrivalent 0.5 ML injection Generic drug: influenza vac split quadrivalent PF   olmesartan 5 MG tablet Commonly known as: [employer] manager COVID-19 Vac Bivalent injection Generic drug: COVID-19 mRNA bivalent vaccine (Pfizer)   predniSONE 20 MG tablet Commonly known as: DELTASONE   sodium polystyrene 15 GM/60ML suspension Commonly known as: KAYEXALATE       TAKE these medications    acetaminophen 500 MG tablet Commonly known as: TYLENOL Take 500 mg by mouth daily as needed (pain).   albuterol (2.5 MG/3ML) 0.083% nebulizer solution Commonly known as: PROVENTIL Take 3 mLs (2.5 mg total) by nebulization every 6 (six) hours as needed for wheezing or shortness of breath. What changed: when to take this   albuterol 108 (90 Base) MCG/ACT inhaler Commonly known as: VENTOLIN HFA TAKE 2 PUFFS BY MOUTH EVERY 6 HOURS AS NEEDED FOR WHEEZE OR SHORTNESS OF BREATH What changed:  how much to take how to take this when to take  this reasons to take this additional instructions   amitriptyline 10 MG tablet Commonly known as: ELAVIL TAKE 1 TABLET BY MOUTH EVERYDAY AT BEDTIME What changed: See the new instructions.   baclofen 10 MG tablet Commonly known as: LIORESAL Take 1 tablet (10 mg total) by mouth 3 (three) times daily. What changed:  when to take this reasons to take this   bismuth subsalicylate 262 MG/15ML suspension Commonly known as: PEPTO BISMOL Take 30 mLs by mouth daily as needed (acid reflux).   budesonide-formoterol 80-4.5 MCG/ACT inhaler Commonly known as: SYMBICORT TAKE 2 PUFFS BY MOUTH TWICE A DAY What changed:  See the new instructions. Another medication with the same name was removed. Continue taking this medication, and follow the directions you see here.   cephALEXin 500 MG capsule Commonly known as: KEFLEX Take 1 capsule (500 mg total) by mouth every 12 (twelve) hours for 8 doses.   D2000 Ultra Strength 50 MCG (2000 UT) Caps  Generic drug: Cholecalciferol Take 2,000 Units by mouth daily.   diclofenac Sodium 1 % Gel Commonly known as: VOLTAREN APPLY 4 G TOPICALLY 4 (FOUR) TIMES DAILY. What changed:  how much to take when to take this reasons to take this   EXCEDRIN PO Take 1 tablet by mouth as needed (headache).   FLUoxetine 40 MG capsule Commonly known as: PROZAC TAKE 1 CAPSULE BY MOUTH EVERY DAY What changed: how much to take   fluticasone 50 MCG/ACT nasal spray Commonly known as: FLONASE SPRAY 2 SPRAYS INTO EACH NOSTRIL EVERY DAY What changed: See the new instructions.   gabapentin 100 MG capsule Commonly known as: NEURONTIN TAKE 1 CAPSULE (100 MG TOTAL) BY MOUTH 3 TIMES A DAY What changed: See the new instructions.   meloxicam 7.5 MG tablet Commonly known as: Mobic Take 1 tablet (7.5 mg total) by mouth daily.   modafinil 200 MG tablet Commonly known as: PROVIGIL TAKE 1 TABLET BY MOUTH EVERY DAY   multivitamin with minerals Tabs tablet Take 1 tablet  by mouth daily.   Ocrevus 300 MG/10ML injection Generic drug: ocrelizumab Inject 20 mLs (600 mg total) into the vein every 6 (six) months. What changed: how much to take   omeprazole 20 MG capsule Commonly known as: PRILOSEC Take 40 mg by mouth 2 (two) times daily before a meal.   oxybutynin 5 MG tablet Commonly known as: DITROPAN TAKE 1 TABLET BY MOUTH TWICE A DAY What changed:  when to take this reasons to take this   TUMS PO Take 2 tablets by mouth 2 (two) times daily as needed (acid reflux).   venlafaxine XR 37.5 MG 24 hr capsule Commonly known as: EFFEXOR-XR TAKE 1 CAPSULE BY MOUTH DAILY WITH BREAKFAST. PATIENT NEEDS APPOINTMENT BEFORE ANY FURTHER REFILLS What changed: See the new instructions.        Allergies  Allergen Reactions   Ibuprofen Other (See Comments)    Cannot take due to gastric bypass   Nsaids Other (See Comments)    Gastric bypass     Procedures/Studies: DG Ribs Unilateral W/Chest Right  Result Date: 03/28/2022 CLINICAL DATA:  Fall, right rib pain EXAM: RIGHT RIBS AND CHEST - 3+ VIEW COMPARISON:  05/24/2021 FINDINGS: There are chronic fracture deformities of the right second through eighth ribs. No acute fracture is seen involving the right ribs. There is a age indeterminate compression fracture of T8. There is no evidence of pneumothorax or pleural effusion. Both lungs are clear. Heart size and mediastinal contours are stable. IMPRESSION: 1. No acute fracture identified involving the right ribs. Chronic fracture deformities of the right second through eighth ribs. 2. Age indeterminate compression fracture of T8. Correlate with point tenderness. Electronically Signed   By: Duanne Guess D.O.   On: 03/28/2022 09:32      Discharge Exam: Vitals:   03/30/22 0759 03/30/22 0840  BP: (!) 163/101   Pulse: 91   Resp:    Temp:    SpO2:  99%    General: Pt is alert, awake, not in acute distress Cardiovascular: RRR, S1/S2 +, no edema Respiratory:  CTA bilaterally, no wheezing, no rhonchi, no respiratory distress, no conversational dyspnea  Abdominal: Soft, NT, ND, bowel sounds + Extremities: no edema, no cyanosis Psych: Normal mood and affect, stable judgement and insight     The results of significant diagnostics from this hospitalization (including imaging, microbiology, ancillary and laboratory) are listed below for reference.     Microbiology: Recent Results (from the past 240 hour(s))  Urine Culture     Status: Abnormal   Collection Time: 03/28/22 11:10 AM   Specimen: In/Out Cath Urine  Result Value Ref Range Status   Specimen Description   Final    IN/OUT CATH URINE Performed at [facility], [address] [place], [place] [address]    Special Requests   Final    NONE Performed at [facility], [address] [place], [place] [address]    Culture 30,000 COLONIES/mL ESCHERICHIA COLI (A)  Final   Report Status 03/30/2022 FINAL  Final   Organism ID, Bacteria ESCHERICHIA COLI (A)  Final      Susceptibility   Escherichia coli - MIC*    AMPICILLIN 8 SENSITIVE Sensitive     CEFAZOLIN <=4 SENSITIVE Sensitive     CEFEPIME <=0.12 SENSITIVE Sensitive     CEFTRIAXONE <=0.25 SENSITIVE Sensitive     CIPROFLOXACIN <=0.25 SENSITIVE Sensitive     GENTAMICIN <=1 SENSITIVE Sensitive     IMIPENEM <=0.25 SENSITIVE Sensitive     NITROFURANTOIN <=16 SENSITIVE Sensitive     TRIMETH/SULFA <=20 SENSITIVE Sensitive     AMPICILLIN/SULBACTAM 4 SENSITIVE Sensitive     PIP/TAZO <=4 SENSITIVE Sensitive     * 30,000 COLONIES/mL ESCHERICHIA COLI     Labs: BNP (last 3 results) Recent Labs    03/28/22 0845  BNP 79.7   Basic Metabolic Panel: Recent Labs  Lab 03/28/22 0845 03/28/22 1348 03/28/22 2143 03/29/22 0358 03/29/22 1006 03/30/22 0328  NA 120* 121* 130* 128* 129* 133*  K 2.6* 3.5 5.6* 5.1 4.6 4.6  CL 83* 84* 94* 93* 94* 97*  CO2 26 28 28 24 28 28   GLUCOSE 91 83 91 82 94 91  BUN 15 13 10 9 9 9    CREATININE 0.95 0.78 0.77 0.69 0.70  CALCIUM 8.4* 8.6* 9.1 8.6* 8.7* 8.8*  MG 1.4*  --   --  1.9  --  1.9  PHOS  --  2.7  --   --   --   --    Liver Function Tests: Recent Labs  Lab 03/28/22 0845  AST 35  ALT 12  ALKPHOS 84  BILITOT 0.4  PROT 6.6  ALBUMIN 3.6   No results for input(s): "LIPASE", "AMYLASE" in the last 168 hours. No results for input(s): "AMMONIA" in the last 168 hours. CBC: Recent Labs  Lab 03/28/22 0845 03/29/22 0358  WBC 9.3 6.5  NEUTROABS 6.6 4.4  HGB 9.1* 9.8*  HCT 26.5* 30.9*  MCV 84.7 90.6  PLT 298 308   Cardiac Enzymes: Recent Labs  Lab 03/29/22 1244  CKTOTAL 199   BNP: Invalid input(s): "POCBNP" CBG: Recent Labs  Lab 03/28/22 0846  GLUCAP 74   D-Dimer No results for input(s): "DDIMER" in the last 72 hours. Hgb A1c No results for input(s): "HGBA1C" in the last 72 hours. Lipid Profile No results for input(s): "CHOL", "HDL", "LDLCALC", "TRIG", "CHOLHDL", "LDLDIRECT" in the last 72 hours. Thyroid function studies Recent Labs    03/29/22 1243  TSH 2.550   Anemia work up Recent Labs    03/29/22 1243  VITAMINB12 682  FOLATE >40.0   Urinalysis    Component Value Date/Time   COLORURINE YELLOW 03/28/2022 1110   APPEARANCEUR CLOUDY (A) 03/28/2022 1110   LABSPEC <=1.005 03/28/2022 1110   PHURINE 5.0 03/28/2022 1110   GLUCOSEU NEGATIVE 03/28/2022 1110   HGBUR TRACE (A) 03/28/2022 1110   BILIRUBINUR NEGATIVE 03/28/2022 1110   KETONESUR NEGATIVE 03/28/2022 1110  PROTEINUR NEGATIVE 03/28/2022 1110   NITRITE POSITIVE (A) 03/28/2022 1110   LEUKOCYTESUR NEGATIVE 03/28/2022 1110   Sepsis Labs Recent Labs  Lab 03/28/22 0845 03/29/22 0358  WBC 9.3 6.5   Microbiology Recent Results (from the past 240 hour(s))  Urine Culture     Status: Abnormal   Collection Time: 03/28/22 11:10 AM   Specimen: In/Out Cath Urine  Result Value Ref Range Status   Specimen Description   Final    IN/OUT CATH URINE Performed at [facility], [address] [place], [place] [address]    Special Requests   Final    NONE Performed at [facility], [address] [place], [place] [address]    Culture 30,000 COLONIES/mL ESCHERICHIA COLI (A)  Final   Report Status 03/30/2022 FINAL  Final   Organism ID, Bacteria ESCHERICHIA COLI (A)  Final      Susceptibility   Escherichia coli - MIC*    AMPICILLIN 8 SENSITIVE Sensitive     CEFAZOLIN <=4 SENSITIVE Sensitive     CEFEPIME <=0.12 SENSITIVE Sensitive     CEFTRIAXONE <=0.25 SENSITIVE Sensitive     CIPROFLOXACIN <=0.25 SENSITIVE Sensitive     GENTAMICIN <=1 SENSITIVE Sensitive     IMIPENEM <=0.25 SENSITIVE Sensitive     NITROFURANTOIN <=16 SENSITIVE Sensitive     TRIMETH/SULFA <=20 SENSITIVE Sensitive     AMPICILLIN/SULBACTAM 4 SENSITIVE Sensitive     PIP/TAZO <=4 SENSITIVE Sensitive     * 30,000 COLONIES/mL ESCHERICHIA COLI     Patient was seen and examined on the day of discharge and was found to be in stable condition. Time coordinating discharge: 25 minutes including assessment and coordination of care, as well as examination of the patient.   SIGNED:  Noralee Stain, DO [facility] 03/30/2022, 10:21 AM

## 2022-03-30 NOTE — TOC Initial Note (Signed)
Transition of Care [facility]) - Initial/Assessment Note    Patient Details  Name: Catherine Hubbard MRN: [id number] Date of Birth: 10-Apr-1961  Transition of Care [facility]) CM/SW Contact:    Golda Acre, RN Phone Number: 03/30/2022, 8:13 AM  Clinical Narrative:                 Resources for op and inpatient drug and alcohol treatments added to the dc instructions.  Expected Discharge Plan: Home/Self Care Barriers to Discharge: Continued Medical Work up   Patient Goals and CMS Choice Patient states their goals for this hospitalization and ongoing recovery are:: i want to go back home CMS Medicare.gov Compare Post Acute Care list provided to:: Patient Choice offered to / list presented to : Patient  Expected Discharge Plan and Services Expected Discharge Plan: Home/Self Care   Discharge Planning Services: CM Consult   Living arrangements for the past 2 months: Single Family Home                                      Prior Living Arrangements/Services Living arrangements for the past 2 months: Single Family Home Lives with:: Spouse Patient language and need for interpreter reviewed:: Yes Do you feel safe going back to the place where you live?: Yes            Criminal Activity/Legal Involvement Pertinent to Current Situation/Hospitalization: No - Comment as needed  Activities of Daily Living Home Assistive Devices/Equipment: Wheelchair    Permission Sought/Granted                  Emotional Assessment Appearance:: Appears stated age Attitude/Demeanor/Rapport: Engaged Affect (typically observed): Calm Orientation: : Oriented to Place, Oriented to Self, Oriented to  Time, Oriented to Situation Alcohol / Substance Use: Alcohol Use Psych Involvement: No (comment)  Admission diagnosis:  Hypokalemia [E87.6] Hypomagnesemia [E83.42] Hyponatremia [E87.1] Patient Active Problem List   Diagnosis Date Noted   Hyponatremia 03/28/2022   Frequent falls  03/28/2022   Hypomagnesemia 03/28/2022   Alcohol abuse 03/28/2022   GERD without esophagitis 03/28/2022   History of alcohol abuse 12/02/2019   Multiple rib fractures 04/14/2019   Multiple fractures of ribs, bilateral, initial encounter for closed fracture 04/14/2019   Closed 3-part fracture of proximal humerus with routine healing 04/01/2019   Multiple sclerosis exacerbation (HCC) 11/16/2018   Exacerbation of multiple sclerosis (HCC) 11/16/2018   Pneumonia 09/26/2018   Elevated troponin 09/26/2018   Acute hypokalemia 09/25/2018   Arthritis of carpometacarpal joint 03/27/2018   Nocturnal leg cramps 03/26/2018   Syncope due to orthostatic hypotension 02/12/2018   Diarrhea 02/08/2018   Acute lower UTI 02/08/2018   Hematoma of scalp 02/08/2018   Neck pain 01/20/2018   Foreign body in esophagus    Esophageal stricture    Intractable nausea and vomiting 01/31/2017   Dysphagia    Severe protein-calorie malnutrition (HCC)    Nausea & vomiting 01/30/2017   Potassium disorder 01/30/2017   Cystitis 01/30/2017   Costochondritis 05/11/2016   Trochanteric bursitis of both hips 03/28/2016   Upper back pain 03/28/2016   Mild intermittent asthma without complication 11/10/2015   Bilateral low back pain with bilateral sciatica 07/13/2015   Right hip pain 07/13/2015   Lumbar radicular pain 03/21/2015   Other fatigue 01/11/2015   Dysesthesia 01/11/2015   Urinary urgency 01/11/2015   Relapsing remitting multiple sclerosis (HCC) 10/06/2014   Numbness 10/06/2014  Ataxic gait 10/06/2014   High risk medication use 10/06/2014   Gastric bypass status for obesity 10/06/2014   Cognitive changes 10/06/2014   Depression with anxiety 10/06/2014   Restless leg 10/06/2014   Insomnia 10/06/2014   Essential hypertension 10/06/2014   Difficulty hearing 01/13/2014   Other muscle spasm 01/13/2014   PCP:  [name], PA-C Pharmacy:   [id number] [place], [place] - [address] [place] [address] Phone: [phone] Fax: [phone]  [facility] [address] [place] [address] Phone: [phone] Fax: [phone]  [facility] [place] - [place], [place] - [address] [place] [address] Phone: [phone] Fax: [phone]  [facility] [address] [place], [place] - [address] [place] [address] Phone: [phone] Fax: [phone]  [id number] [place], [place] - [address] AT [facility] [address] [place] [address] Phone: [phone] Fax: [phone]  [id number] - [place], [place] - [address] AT [facility] [address] [facility] [address] Phone: [phone] Fax: [phone]     Social Determinants of Health (SDOH) Interventions    Readmission Risk Interventions     No data to display

## 2022-03-30 NOTE — TOC Progression Note (Addendum)
Transition of Care [facility]) - Progression Note    Patient Details  Name: Catherine Hubbard MRN: [id number] Date of Birth: 06-02-1961  Transition of Care [facility]) CM/SW Contact  Golda Acre, RN Phone Number: 03/30/2022, 12:55 PM  Clinical Narrative:    Hhc agencies called for hhc but denied to follow: [facility], [facility], [facility].  None will take this patient's insurance Md made aware of.   Expected Discharge Plan: Home/Self Care Barriers to Discharge: No Home Care Agency will accept this patient  Expected Discharge Plan and Services Expected Discharge Plan: Home/Self Care   Discharge Planning Services: CM Consult   Living arrangements for the past 2 months: Single Family Home Expected Discharge Date: 03/30/22                                     Social Determinants of Health (SDOH) Interventions    Readmission Risk Interventions     No data to display

## 2022-04-06 ENCOUNTER — Encounter: Payer: Self-pay | Admitting: Family Medicine

## 2022-04-06 ENCOUNTER — Ambulatory Visit: Payer: Federal, State, Local not specified - PPO | Admitting: Family Medicine

## 2022-04-06 VITALS — BP 144/98 | HR 81 | Temp 98.2°F | Resp 18 | Ht 63.0 in | Wt 166.2 lb

## 2022-04-06 DIAGNOSIS — F1011 Alcohol abuse, in remission: Secondary | ICD-10-CM

## 2022-04-06 DIAGNOSIS — E871 Hypo-osmolality and hyponatremia: Secondary | ICD-10-CM | POA: Diagnosis not present

## 2022-04-06 DIAGNOSIS — N390 Urinary tract infection, site not specified: Secondary | ICD-10-CM

## 2022-04-06 DIAGNOSIS — E878 Other disorders of electrolyte and fluid balance, not elsewhere classified: Secondary | ICD-10-CM

## 2022-04-06 DIAGNOSIS — I10 Essential (primary) hypertension: Secondary | ICD-10-CM

## 2022-04-06 DIAGNOSIS — S51819D Laceration without foreign body of unspecified forearm, subsequent encounter: Secondary | ICD-10-CM | POA: Insufficient documentation

## 2022-04-06 LAB — CBC WITH DIFFERENTIAL/PLATELET
Basophils Absolute: 0.1 10*3/uL (ref 0.0–0.1)
Basophils Relative: 2 % (ref 0.0–3.0)
Eosinophils Absolute: 0.2 10*3/uL (ref 0.0–0.7)
Eosinophils Relative: 2.7 % (ref 0.0–5.0)
HCT: 32 % — ABNORMAL LOW (ref 36.0–46.0)
Hemoglobin: 10.3 g/dL — ABNORMAL LOW (ref 12.0–15.0)
Lymphocytes Relative: 19.1 % (ref 12.0–46.0)
Lymphs Abs: 1.2 10*3/uL (ref 0.7–4.0)
MCHC: 32.2 g/dL (ref 30.0–36.0)
MCV: 89.1 fl (ref 78.0–100.0)
Monocytes Absolute: 1 10*3/uL (ref 0.1–1.0)
Monocytes Relative: 15.3 % — ABNORMAL HIGH (ref 3.0–12.0)
Neutro Abs: 3.8 10*3/uL (ref 1.4–7.7)
Neutrophils Relative %: 60.9 % (ref 43.0–77.0)
Platelets: 448 10*3/uL — ABNORMAL HIGH (ref 150.0–400.0)
RBC: 3.6 Mil/uL — ABNORMAL LOW (ref 3.87–5.11)
RDW: 16.6 % — ABNORMAL HIGH (ref 11.5–15.5)
WBC: 6.3 10*3/uL (ref 4.0–10.5)

## 2022-04-06 LAB — POC URINALSYSI DIPSTICK (AUTOMATED)
Blood, UA: NEGATIVE
Glucose, UA: NEGATIVE
Ketones, UA: NEGATIVE
Leukocytes, UA: NEGATIVE
Nitrite, UA: NEGATIVE
Protein, UA: NEGATIVE
Spec Grav, UA: 1.01 (ref 1.010–1.025)
Urobilinogen, UA: 0.2 E.U./dL
pH, UA: 7 (ref 5.0–8.0)

## 2022-04-06 LAB — COMPREHENSIVE METABOLIC PANEL
ALT: 10 U/L (ref 0–35)
AST: 25 U/L (ref 0–37)
Albumin: 4.2 g/dL (ref 3.5–5.2)
Alkaline Phosphatase: 93 U/L (ref 39–117)
BUN: 9 mg/dL (ref 6–23)
CO2: 29 mEq/L (ref 19–32)
Calcium: 9.8 mg/dL (ref 8.4–10.5)
Chloride: 98 mEq/L (ref 96–112)
Creatinine, Ser: 0.76 mg/dL (ref 0.40–1.20)
GFR: 84.36 mL/min (ref >60.00)
Glucose, Bld: 97 mg/dL (ref 70–99)
Potassium: 4.6 mEq/L (ref 3.5–5.1)
Sodium: 135 mEq/L (ref 135–145)
Total Bilirubin: 0.9 mg/dL (ref 0.2–1.2)
Total Protein: 6.8 g/dL (ref 6.0–8.3)

## 2022-04-06 LAB — MAGNESIUM: Magnesium: 1.5 mg/dL (ref 1.5–2.5)

## 2022-04-06 MED ORDER — AMLODIPINE BESYLATE 5 MG PO TABS: 5.0000 mg | ORAL_TABLET | Freq: Every day | ORAL | 1 refills | Status: DC

## 2022-04-06 MED ORDER — MUPIROCIN 2 % EX OINT: 1.0000 | TOPICAL_OINTMENT | Freq: Two times a day (BID) | CUTANEOUS | 2 refills | Status: DC

## 2022-04-06 NOTE — Progress Notes (Signed)
Established Patient Office Visit  Subjective   Patient ID: Catherine Hubbard, female    DOB: 1961/05/26  Age: 61 y.o. MRN: [id number]  Chief Complaint  Patient presents with   Hospitalization Follow-up    HPI Pt is here f/u hosp 8/9-8/11 for hyponatremia , falls.  She fell 2 x at home   Patient Active Problem List   Diagnosis Date Noted   Skin tear of forearm without complication, subsequent encounter 04/06/2022   Hyponatremia 03/28/2022   Frequent falls 03/28/2022   Hypomagnesemia 03/28/2022   Alcohol abuse 03/28/2022   GERD without esophagitis 03/28/2022   History of alcohol abuse 12/02/2019   Multiple rib fractures 04/14/2019   Multiple fractures of ribs, bilateral, initial encounter for closed fracture 04/14/2019   Closed 3-part fracture of proximal humerus with routine healing 04/01/2019   Multiple sclerosis exacerbation (HCC) 11/16/2018   Exacerbation of multiple sclerosis (HCC) 11/16/2018   Pneumonia 09/26/2018   Elevated troponin 09/26/2018   Acute hypokalemia 09/25/2018   Arthritis of carpometacarpal joint 03/27/2018   Nocturnal leg cramps 03/26/2018   Syncope due to orthostatic hypotension 02/12/2018   Diarrhea 02/08/2018   Urinary tract infection without hematuria 02/08/2018   Hematoma of scalp 02/08/2018   Neck pain 01/20/2018   Foreign body in esophagus    Esophageal stricture    Intractable nausea and vomiting 01/31/2017   Dysphagia    Severe protein-calorie malnutrition (HCC)    Nausea & vomiting 01/30/2017   Potassium disorder 01/30/2017   Cystitis 01/30/2017   Costochondritis 05/11/2016   Trochanteric bursitis of both hips 03/28/2016   Upper back pain 03/28/2016   Mild intermittent asthma without complication 11/10/2015   Bilateral low back pain with bilateral sciatica 07/13/2015   Right hip pain 07/13/2015   Lumbar radicular pain 03/21/2015   Other fatigue 01/11/2015   Dysesthesia 01/11/2015   Urinary urgency 01/11/2015   Relapsing  remitting multiple sclerosis (HCC) 10/06/2014   Numbness 10/06/2014   Ataxic gait 10/06/2014   High risk medication use 10/06/2014   Gastric bypass status for obesity 10/06/2014   Cognitive changes 10/06/2014   Depression with anxiety 10/06/2014   Restless leg 10/06/2014   Insomnia 10/06/2014   Essential hypertension 10/06/2014   Difficulty hearing 01/13/2014   Other muscle spasm 01/13/2014   Past Medical History:  Diagnosis Date   Alcohol abuse    Asthma    Esophagitis    GERD (gastroesophageal reflux disease)    Headache    Hearing loss    Hypertension    Multiple sclerosis (HCC)    Pancreatitis    Vision abnormalities    Past Surgical History:  Procedure Laterality Date   ANKLE FRACTURE SURGERY Bilateral    CARPAL TUNNEL RELEASE Bilateral    CHOLECYSTECTOMY     COLONOSCOPY  11/2016   multiple   ESOPHAGOGASTRODUODENOSCOPY  11/2016   ESOPHAGOGASTRODUODENOSCOPY N/A 02/01/2017   Procedure: ESOPHAGOGASTRODUODENOSCOPY (EGD);  Surgeon: Iva Boop, MD;  Location: [facility] ENDOSCOPY;  Service: Endoscopy;  Laterality: N/A;   ESOPHAGOGASTRODUODENOSCOPY (EGD) WITH PROPOFOL N/A 02/04/2017   Procedure: ESOPHAGOGASTRODUODENOSCOPY (EGD) WITH PROPOFOL;  Surgeon: Meryl Dare, MD;  Location: [facility] ENDOSCOPY;  Service: Endoscopy;  Laterality: N/A;   LAPAROSCOPIC GASTRIC SLEEVE RESECTION  2011   ULNAR NERVE REPAIR Bilateral    Social History   Tobacco Use   Smoking status: Never   Smokeless tobacco: Never   Tobacco comments:    second hand smoker when young. Heavy exposure.  Vaping Use   Vaping Use: Never  used  Substance Use Topics   Alcohol use: Yes    Comment: daily   Drug use: No   Social History   Socioeconomic History   Marital status: Married    Spouse name: Not on file   Number of children: Not on file   Years of education: Not on file   Highest education level: Not on file  Occupational History   Not on file  Tobacco Use   Smoking status: Never   Smokeless  tobacco: Never   Tobacco comments:    second hand smoker when young. Heavy exposure.  Vaping Use   Vaping Use: Never used  Substance and Sexual Activity   Alcohol use: Yes    Comment: daily   Drug use: No   Sexual activity: Not Currently  Other Topics Concern   Not on file  Social History Narrative   Not on file   Social Determinants of Health   Financial Resource Strain: Not on file  Food Insecurity: Not on file  Transportation Needs: Not on file  Physical Activity: Not on file  Stress: Not on file  Social Connections: Not on file  Intimate Partner Violence: Not on file   Family Status  Relation Name Status   Mother  Deceased   Father  Deceased   Family History  Problem Relation Age of Onset   Cancer Mother    Stroke Mother    Cancer Father    Allergies  Allergen Reactions   Ibuprofen Other (See Comments)    Cannot take due to gastric bypass   Nsaids Other (See Comments)    Gastric bypass      Review of Systems  Constitutional:  Negative for fever and malaise/fatigue.  HENT:  Negative for congestion.   Eyes:  Negative for blurred vision.  Respiratory:  Negative for shortness of breath.   Cardiovascular:  Negative for chest pain, palpitations and leg swelling.  Gastrointestinal:  Negative for abdominal pain, blood in stool and nausea.  Genitourinary:  Negative for dysuria and frequency.  Musculoskeletal:  Negative for falls.  Skin:  Negative for rash.       Bruising and skin tears b/l arms   Neurological:  Negative for dizziness, loss of consciousness and headaches.  Endo/Heme/Allergies:  Negative for environmental allergies.  Psychiatric/Behavioral:  Negative for depression. The patient is not nervous/anxious.       Objective:     BP (!) 144/98 (BP Location: Left Arm, Patient Position: Sitting, Cuff Size: Normal)   Pulse 81   Temp 98.2 F (36.8 C) (Oral)   Resp 18   Ht 5\' 3"  (1.6 m)   Wt 166 lb 3.2 oz (75.4 kg)   LMP 08/13/2014 Comment: very  few periods  SpO2 98%   BMI 29.44 kg/m  BP Readings from Last 3 Encounters:  04/06/22 (!) 144/98  03/30/22 (!) 163/101  12/18/21 136/80   Wt Readings from Last 3 Encounters:  04/06/22 166 lb 3.2 oz (75.4 kg)  03/28/22 179 lb 4.8 oz (81.3 kg)  12/18/21 164 lb (74.4 kg)   SpO2 Readings from Last 3 Encounters:  04/06/22 98%  03/30/22 99%  12/18/21 100%      Physical Exam Vitals and nursing note reviewed.  Constitutional:      Appearance: She is well-developed.  HENT:     Head: Normocephalic and atraumatic.  Eyes:     Conjunctiva/sclera: Conjunctivae normal.  Neck:     Thyroid: No thyromegaly.     Vascular: No carotid bruit  or JVD.  Cardiovascular:     Rate and Rhythm: Normal rate and regular rhythm.     Heart sounds: Normal heart sounds. No murmur heard. Pulmonary:     Effort: Pulmonary effort is normal. No respiratory distress.     Breath sounds: Normal breath sounds. No wheezing or rales.  Chest:     Chest wall: No tenderness.  Musculoskeletal:     Cervical back: Normal range of motion and neck supple.  Skin:    Findings: Bruising, erythema and lesion present.  Neurological:     Mental Status: She is alert and oriented to person, place, and time.      Results for orders placed or performed in visit on 04/06/22  POCT Urinalysis Dipstick (Automated)  Result Value Ref Range   Color, UA Yellow    Clarity, UA clear    Glucose, UA Negative Negative   Bilirubin, UA small    Ketones, UA Negative    Spec Grav, UA 1.010 1.010 - 1.025   Blood, UA Negative    pH, UA 7.0 5.0 - 8.0   Protein, UA Negative Negative   Urobilinogen, UA 0.2 0.2 or 1.0 E.U./dL   Nitrite, UA Negative    Leukocytes, UA Negative Negative    Last CBC Lab Results  Component Value Date   WBC 6.5 03/29/2022   HGB 9.8 (L) 03/29/2022   HCT 30.9 (L) 03/29/2022   MCV 90.6 03/29/2022   MCH 28.7 03/29/2022   RDW 15.6 (H) 03/29/2022   PLT 308 03/29/2022   Last metabolic panel Lab Results   Component Value Date   GLUCOSE 91 03/30/2022   NA 133 (L) 03/30/2022   K 4.6 03/30/2022   CL 97 (L) 03/30/2022   CO2 28 03/30/2022   BUN 9 03/30/2022   CREATININE 0.70 03/30/2022   GFRNONAA >60 03/30/2022   CALCIUM 8.8 (L) 03/30/2022   PHOS 2.7 03/28/2022   PROT 6.6 03/28/2022   ALBUMIN 3.6 03/28/2022   LABGLOB 2.5 06/02/2019   AGRATIO 1.7 06/02/2019   BILITOT 0.4 03/28/2022   ALKPHOS 84 03/28/2022   AST 35 03/28/2022   ALT 12 03/28/2022   ANIONGAP 8 03/30/2022   Last lipids Lab Results  Component Value Date   CHOL 135 12/18/2021   HDL 71.70 12/18/2021   LDLCALC 50 12/18/2021   TRIG 67.0 12/18/2021   CHOLHDL 2 12/18/2021   Last hemoglobin A1c No results found for: "HGBA1C" Last thyroid functions Lab Results  Component Value Date   TSH 2.550 03/29/2022   T4TOTAL 5.2 08/26/2018   Last vitamin D No results found for: "25OHVITD2", "25OHVITD3", "VD25OH" Last vitamin B12 and Folate Lab Results  Component Value Date   VITAMINB12 682 03/29/2022   FOLATE >40.0 03/29/2022      The 10-year ASCVD risk score (Arnett DK, et al., 2019) is: 4%    Assessment & Plan:   Problem List Items Addressed This Visit       Unprioritized   Potassium disorder   Relevant Orders   CBC with Differential/Platelet   Comprehensive metabolic panel   Magnesium   POCT Urinalysis Dipstick (Automated) (Completed)   Urinary tract infection without hematuria    ua normal today      Relevant Medications   mupirocin ointment (BACTROBAN) 2 %   Other Relevant Orders   POCT Urinalysis Dipstick (Automated) (Completed)   Skin tear of forearm without complication, subsequent encounter   Relevant Medications   mupirocin ointment (BACTROBAN) 2 %   Hyponatremia - Primary  Chemistry      Component Value Date/Time   NA 133 (L) 03/30/2022 0328   NA 140 06/02/2019 1549   K 4.6 03/30/2022 0328   CL 97 (L) 03/30/2022 0328   CO2 28 03/30/2022 0328   BUN 9 03/30/2022 0328   BUN 15  06/02/2019 1549   CREATININE 0.70 03/30/2022 0328   CREATININE 0.62 11/27/2018 1505      Component Value Date/Time   CALCIUM 8.8 (L) 03/30/2022 0328   ALKPHOS 84 03/28/2022 0845   AST 35 03/28/2022 0845   ALT 12 03/28/2022 0845   BILITOT 0.4 03/28/2022 0845   BILITOT 0.5 06/02/2019 1549     prozac and effexor as well as ARB could have been contributing Arb stopped  Pt will f/u pain management about prozac and effexor        Relevant Orders   CBC with Differential/Platelet   Comprehensive metabolic panel   Magnesium   POCT Urinalysis Dipstick (Automated) (Completed)   Hypomagnesemia    Resolved on d/c  Recheck labs today      Relevant Orders   CBC with Differential/Platelet   Comprehensive metabolic panel   Magnesium   History of alcohol abuse    May have contributed to the hyponatremia but pt states she had not been drinking       Essential hypertension    Poorly controlled will alter medications, encouraged DASH diet, minimize caffeine and obtain adequate sleep. Report concerning symptoms and follow up as directed and as needed      Relevant Medications   amLODipine (NORVASC) 5 MG tablet    Return in about 3 weeks (around 04/27/2022), or if symptoms worsen or fail to improve, for Edward , hypertension.    Donato Schultz, DO

## 2022-04-06 NOTE — Assessment & Plan Note (Signed)
Poorly controlled will alter medications, encouraged DASH diet, minimize caffeine and obtain adequate sleep. Report concerning symptoms and follow up as directed and as needed 

## 2022-04-06 NOTE — Assessment & Plan Note (Signed)
Resolved on d/c  Recheck labs today

## 2022-04-06 NOTE — Assessment & Plan Note (Addendum)
Chemistry      Component Value Date/Time   NA 133 (L) 03/30/2022 0328   NA 140 06/02/2019 1549   K 4.6 03/30/2022 0328   CL 97 (L) 03/30/2022 0328   CO2 28 03/30/2022 0328   BUN 9 03/30/2022 0328   BUN 15 06/02/2019 1549   CREATININE 0.70 03/30/2022 0328   CREATININE 0.62 11/27/2018 1505      Component Value Date/Time   CALCIUM 8.8 (L) 03/30/2022 0328   ALKPHOS 84 03/28/2022 0845   AST 35 03/28/2022 0845   ALT 12 03/28/2022 0845   BILITOT 0.4 03/28/2022 0845   BILITOT 0.5 06/02/2019 1549     prozac and effexor as well as ARB could have been contributing Arb stopped  Pt will f/u pain management about prozac and effexor

## 2022-04-06 NOTE — Assessment & Plan Note (Signed)
ua normal today 

## 2022-04-06 NOTE — Patient Instructions (Signed)
Hyponatremia Hyponatremia is when the amount of salt (sodium) in a person's blood is too low. When sodium levels are low, the cells absorb extra water, which causes them to swell. The swelling happens throughout the body, but it mostly affects the brain. What are the causes? This condition may be caused by: Certain medical conditions, such as: Heart, kidney, or liver problems. Thyroid problems. Adrenal gland problems. Metabolic conditions, such as Addison's disease or syndrome of inappropriate antidiuresis (SIAD). Excessive vomiting, diarrhea, or sweating. Certain medicines or illegal drugs. Fluids given through an IV. What increases the risk? You are more likely to develop this condition if you: Have certain medical conditions such as heart, kidney, or liver failure. Have a medical condition that causes frequent or excessive diarrhea. Participate in intense physical activities, such as marathon running. Take certain medicines that affect the sodium and fluid balance in the blood. Some of these medicine types include: Diuretics. NSAIDs, such as ibuprofen. Some opioid pain medicines. Some antidepressants. Some seizure prevention medicines. What are the signs or symptoms? Symptoms of this condition include: Headache. Nausea and vomiting. Being very tired (lethargic). Muscle weakness and cramping. Loss of appetite. Feeling weak or light-headed. Severe symptoms of this condition include: Confusion. Agitation. Having a rapid heart rate. Fainting. Seizures. Coma. How is this diagnosed? This condition is diagnosed based on: A physical exam. Your medical history. Tests, including: Blood tests. Urine tests. How is this treated? Treatment for this condition depends on the cause. Treatment may include: Getting fluids through an IV that is inserted into one of your veins. Medicines to correct the sodium imbalance. If medicines are causing the condition, the medicines will need to  be adjusted. Limiting your water or fluid intake to get the correct sodium balance, in certain cases. Monitoring in the hospital to closely watch your symptoms for improvement. Follow these instructions at home:  Take over-the-counter and prescription medicines only as told by your health care provider. Many medicines can make this condition worse. Talk with your health care provider about any medicines that you are currently taking. Do not drink alcohol. Keep all follow-up visits. This is important. Contact a health care provider if: You develop worsening nausea, fatigue, headache, confusion, or weakness. Your symptoms go away and then return. Get help right away if: You have a seizure. You faint. You have ongoing diarrhea or vomiting. Summary Hyponatremia is when the amount of salt (sodium) in your blood is too low. When sodium levels are low, your cells absorb extra water, which causes them to swell. The swelling happens throughout the body, but it mostly affects the brain. Treatment for this condition depends on the cause. It may include receiving IV fluids, taking or adjusting medicines, limiting fluid intake, and monitoring in the hospital. This information is not intended to replace advice given to you by your health care provider. Make sure you discuss any questions you have with your health care provider. Document Revised: 02/14/2021 Document Reviewed: 02/14/2021 Elsevier Patient Education  2023 [employer]  

## 2022-04-06 NOTE — Assessment & Plan Note (Signed)
May have contributed to the hyponatremia but pt states she had not been drinking

## 2022-04-18 ENCOUNTER — Telehealth: Payer: Self-pay | Admitting: Family Medicine

## 2022-04-18 DIAGNOSIS — G35 Multiple sclerosis: Secondary | ICD-10-CM

## 2022-04-18 MED ORDER — OCREVUS 300 MG/10ML IV SOLN: 600.0000 mg | INTRAVENOUS | 1 refills | Status: DC

## 2022-04-18 NOTE — Telephone Encounter (Signed)
Called back and spoke w/ Ginger. They are needing refill for Ocrevus. She transferred me to pharmacist to provide VO. Spoke w/ Tresa Endo and provided VO for Ocrevus 600mg  IV q 6 mos. Informed Kim/[facility] via teams that rx called in.

## 2022-04-18 NOTE — Telephone Encounter (Signed)
Tiffany @ [facility] is asking for a call back on pt's Rx for OCREVUS 300 MG/10ML injection the call back # is [phone]

## 2022-04-23 ENCOUNTER — Other Ambulatory Visit: Payer: Self-pay | Admitting: Neurology

## 2022-04-25 ENCOUNTER — Other Ambulatory Visit: Payer: Self-pay | Admitting: Neurology

## 2022-04-25 DIAGNOSIS — G35 Multiple sclerosis: Secondary | ICD-10-CM

## 2022-04-26 ENCOUNTER — Encounter
Payer: Federal, State, Local not specified - PPO | Attending: Physical Medicine and Rehabilitation | Admitting: Physical Medicine and Rehabilitation

## 2022-04-26 VITALS — BP 137/90 | HR 95 | Ht 63.0 in | Wt 163.8 lb

## 2022-04-26 DIAGNOSIS — M5481 Occipital neuralgia: Secondary | ICD-10-CM | POA: Insufficient documentation

## 2022-04-26 MED ORDER — BUPIVACAINE HCL (PF) 0.5 % IJ SOLN
4.0000 mL | Freq: Once | INTRAMUSCULAR | Status: AC
  Administered 2022-04-26: 4 mL

## 2022-04-26 NOTE — Progress Notes (Signed)
Occipital nerve block procedure note: bilateral greater and lesser occipital nerves  Written consent was obtained for the patient. Location for greater and lesser occipital nerves was determined using bony landmarks and points of maximal sensitivity.  The areas were cleaned with alcohol, vapocoolant spray applied, needle drawback performed, and a total of 4 cc of 0.5% Marcaine was injected to both these sites.  There were no complications from the procedure, and it was well tolerated.   

## 2022-04-27 ENCOUNTER — Encounter: Payer: Self-pay | Admitting: Medical

## 2022-04-27 ENCOUNTER — Ambulatory Visit: Payer: Federal, State, Local not specified - PPO | Admitting: Medical

## 2022-04-27 VITALS — BP 140/80 | HR 100 | Resp 18 | Ht 63.0 in | Wt 164.2 lb

## 2022-04-27 DIAGNOSIS — E878 Other disorders of electrolyte and fluid balance, not elsewhere classified: Secondary | ICD-10-CM | POA: Diagnosis not present

## 2022-04-27 DIAGNOSIS — E871 Hypo-osmolality and hyponatremia: Secondary | ICD-10-CM | POA: Diagnosis not present

## 2022-04-27 DIAGNOSIS — M2042 Other hammer toe(s) (acquired), left foot: Secondary | ICD-10-CM

## 2022-04-27 DIAGNOSIS — L84 Corns and callosities: Secondary | ICD-10-CM

## 2022-04-27 DIAGNOSIS — M542 Cervicalgia: Secondary | ICD-10-CM | POA: Diagnosis not present

## 2022-04-27 DIAGNOSIS — I10 Essential (primary) hypertension: Secondary | ICD-10-CM

## 2022-04-27 NOTE — Progress Notes (Signed)
Subjective:    Patient ID: Catherine Hubbard, female    DOB: [birth year], 61 y.o.   MRN: [id number]  HPI  Pt in for follow up. Seen by Dr. Laury Axon recently. She saw pt post hospitalization.   Seen last visit for potassium issues, hx of uti, forearm skin tears, low sodium, low magnesium and htn   On labs done past visit   K, mg and NA both normal.   Urine the other day was clear. No culture done. No uti signs or symptoms reported.  Pt notes skin tears now healed.  Htn- bp borderline controlled. With her machine gets borderline readings.  Alcohol abuse history. She states yesteray and day before 2 beers each day.    Review of Systems  Constitutional:  Negative for chills, fatigue and fever.  HENT:  Negative for congestion, ear pain and facial swelling.   Respiratory:  Negative for chest tightness and wheezing.   Cardiovascular:  Negative for chest pain and palpitations.  Gastrointestinal:  Negative for abdominal pain and nausea.  Genitourinary:  Negative for dysuria and frequency.  Musculoskeletal:  Negative for back pain, gait problem and joint swelling.  Skin:        See foot exam and hpi.  Neurological:  Negative for dizziness, seizures and syncope.  Psychiatric/Behavioral:  Negative for behavioral problems, dysphoric mood and sleep disturbance. The patient is not nervous/anxious.     Past Medical History:  Diagnosis Date   Alcohol abuse    Asthma    Esophagitis    GERD (gastroesophageal reflux disease)    Headache    Hearing loss    Hypertension    Multiple sclerosis (HCC)    Pancreatitis    Vision abnormalities      Social History   Socioeconomic History   Marital status: Married    Spouse name: Not on file   Number of children: Not on file   Years of education: Not on file   Highest education level: Not on file  Occupational History   Not on file  Tobacco Use   Smoking status: Never   Smokeless tobacco: Never   Tobacco comments:    second  hand smoker when young. Heavy exposure.  Vaping Use   Vaping Use: Never used  Substance and Sexual Activity   Alcohol use: Yes    Comment: daily   Drug use: No   Sexual activity: Not Currently  Other Topics Concern   Not on file  Social History Narrative   Not on file   Social Determinants of Health   Financial Resource Strain: Not on file  Food Insecurity: Not on file  Transportation Needs: Not on file  Physical Activity: Not on file  Stress: Not on file  Social Connections: Not on file  Intimate Partner Violence: Not on file    Past Surgical History:  Procedure Laterality Date   ANKLE FRACTURE SURGERY Bilateral    CARPAL TUNNEL RELEASE Bilateral    CHOLECYSTECTOMY     COLONOSCOPY  11/2016   multiple   ESOPHAGOGASTRODUODENOSCOPY  11/2016   ESOPHAGOGASTRODUODENOSCOPY N/A 02/01/2017   Procedure: ESOPHAGOGASTRODUODENOSCOPY (EGD);  Surgeon: Iva Boop, MD;  Location: [facility] ENDOSCOPY;  Service: Endoscopy;  Laterality: N/A;   ESOPHAGOGASTRODUODENOSCOPY (EGD) WITH PROPOFOL N/A 02/04/2017   Procedure: ESOPHAGOGASTRODUODENOSCOPY (EGD) WITH PROPOFOL;  Surgeon: Meryl Dare, MD;  Location: [facility] ENDOSCOPY;  Service: Endoscopy;  Laterality: N/A;   LAPAROSCOPIC GASTRIC SLEEVE RESECTION  2011   ULNAR NERVE REPAIR Bilateral  Family History  Problem Relation Age of Onset   Cancer Mother    Stroke Mother    Cancer Father     Allergies  Allergen Reactions   Ibuprofen Other (See Comments)    Cannot take due to gastric bypass   Nsaids Other (See Comments)    Gastric bypass    Current Outpatient Medications on File Prior to Visit  Medication Sig Dispense Refill   acetaminophen (TYLENOL) 500 MG tablet Take 500 mg by mouth daily as needed (pain).     albuterol (PROVENTIL) (2.5 MG/3ML) 0.083% nebulizer solution Take 3 mLs (2.5 mg total) by nebulization every 6 (six) hours as needed for wheezing or shortness of breath. (Patient taking differently: Take 2.5 mg by nebulization as  needed for wheezing or shortness of breath.) 150 mL 1   albuterol (VENTOLIN HFA) 108 (90 Base) MCG/ACT inhaler TAKE 2 PUFFS BY MOUTH EVERY 6 HOURS AS NEEDED FOR WHEEZE OR SHORTNESS OF BREATH (Patient taking differently: Inhale 1-2 puffs into the lungs 2 (two) times daily as needed for wheezing or shortness of breath.) 54 each 2   amitriptyline (ELAVIL) 10 MG tablet TAKE 1 TABLET BY MOUTH EVERYDAY AT BEDTIME (Patient taking differently: Take 10 mg by mouth at bedtime as needed for sleep.) 90 tablet 1   amLODipine (NORVASC) 5 MG tablet Take 1 tablet (5 mg total) by mouth daily. 90 tablet 1   Aspirin-Acetaminophen-Caffeine (EXCEDRIN PO) Take 1 tablet by mouth as needed (headache).     baclofen (LIORESAL) 10 MG tablet Take 1 tablet (10 mg total) by mouth 3 (three) times daily. (Patient taking differently: Take 10 mg by mouth 2 (two) times daily as needed for muscle spasms.) 90 tablet 3   bismuth subsalicylate (PEPTO BISMOL) 262 MG/15ML suspension Take 30 mLs by mouth daily as needed (acid reflux).     budesonide-formoterol (SYMBICORT) 80-4.5 MCG/ACT inhaler TAKE 2 PUFFS BY MOUTH TWICE A DAY (Patient taking differently: Inhale 2 puffs into the lungs as needed (SOB).) 10.2 each 3   Calcium Carbonate Antacid (TUMS PO) Take 2 tablets by mouth 2 (two) times daily as needed (acid reflux).     Cholecalciferol (D2000 ULTRA STRENGTH) 50 MCG (2000 UT) CAPS Take 2,000 Units by mouth daily.     diclofenac Sodium (VOLTAREN) 1 % GEL APPLY 4 G TOPICALLY 4 (FOUR) TIMES DAILY. (Patient taking differently: Apply 1 Application topically 4 (four) times daily as needed (pain).) 400 g 1   FLUoxetine (PROZAC) 40 MG capsule TAKE 1 CAPSULE BY MOUTH EVERY DAY (Patient taking differently: Take 40 mg by mouth daily.) 30 capsule 3   fluticasone (FLONASE) 50 MCG/ACT nasal spray SPRAY 2 SPRAYS INTO EACH NOSTRIL EVERY DAY (Patient taking differently: Place 2 sprays into both nostrils as needed for allergies.) 16 mL 12   gabapentin  (NEURONTIN) 100 MG capsule TAKE 1 CAPSULE (100 MG TOTAL) BY MOUTH 3 TIMES A DAY (Patient taking differently: Take 100 mg by mouth daily as needed (nerve pain).) 90 capsule 0   meloxicam (MOBIC) 7.5 MG tablet Take 1 tablet (7.5 mg total) by mouth daily. 30 tablet 2   modafinil (PROVIGIL) 200 MG tablet TAKE 1 TABLET BY MOUTH EVERY DAY (Patient taking differently: Take 200 mg by mouth daily.) 30 tablet 0   Multiple Vitamin (MULTIVITAMIN WITH MINERALS) TABS tablet Take 1 tablet by mouth daily.     mupirocin ointment (BACTROBAN) 2 % Apply 1 Application topically 2 (two) times daily. 22 g 2   OCREVUS 300 MG/10ML injection Inject  20 mLs (600 mg total) into the vein every 6 (six) months. 20 mL 1   omeprazole (PRILOSEC) 20 MG capsule Take 40 mg by mouth 2 (two) times daily before a meal.     oxybutynin (DITROPAN) 5 MG tablet TAKE 1 TABLET BY MOUTH TWICE A DAY 60 tablet 5   venlafaxine XR (EFFEXOR-XR) 37.5 MG 24 hr capsule TAKE 1 CAPSULE BY MOUTH DAILY WITH BREAKFAST. PATIENT NEEDS APPOINTMENT BEFORE ANY FURTHER REFILLS (Patient taking differently: Take 37.5 mg by mouth daily.) 90 capsule 1   No current facility-administered medications on file prior to visit.    BP (!) 140/80   Pulse 100   Resp 18   Ht 5\' 3"  (1.6 m)   Wt 164 lb 3.2 oz (74.5 kg)   LMP 08/13/2014 Comment: very few periods  SpO2 100%   BMI 29.09 kg/m        Objective:   Physical Exam  General Mental Status- Alert. General Appearance- Not in acute distress.   Skin General: Color- Normal Color. Moisture- Normal Moisture.  Neck Carotid Arteries- Normal color. Moisture- Normal Moisture. No carotid bruits. No JVD.  Chest and Lung Exam Auscultation: Breath Sounds:-Normal.  Cardiovascular Auscultation:Rythm- Regular. Murmurs & Other Heart Sounds:Auscultation of the heart reveals- No Murmurs.  Abdomen Inspection:-Inspeection Normal. Palpation/Percussion:Note:No mass. Palpation and Percussion of the abdomen reveal- Non  Tender, Non Distended + BS, no rebound or guarding.   Neurologic Cranial Nerve exam:- CN III-XII intact(No nystagmus), symmetric smile. Strength:- 5/5 equal and symmetric strength both upper and lower extremities.   Left foot- some hammer toe appearance. Raises area mayb corn or callosu.    Assessment & Plan:   Patient Instructions  Low sodium now in range. Thought on DC summmary maybe prozac, effexor, arb and alcohol use. Though no severe alcohol use described. ARB stopped. Presently continue prozac and effexor prescribed by pain management. Get their opinon on follow up if they want to cut back on dosing. Please try to stop drinking alcohol completely.   Mag and potassium now in range.   Skin tears on forearms healed.  Left foot 3rd toe lesion. Present for 2 years. Will refer to podiatrist.  Htn bp is borderlne controlled today. I want you to get new bp cuff and check bp daily. Update me on readings in one week. Want to see bp closer to 130/80 so may advise increase to 10 mg dose.  Follow up in 3 month or sooner if needed.     08/15/2014, PA-C

## 2022-04-27 NOTE — Patient Instructions (Addendum)
Low sodium now in range. Thought on DC summmary maybe prozac, effexor, arb and alcohol use. Though no severe alcohol use described. ARB stopped. Presently continue prozac and effexor prescribed by pain management. Get their opinion on follow up if they want to cut back on dosing. Please try to stop drinking alcohol completely.   Mag and potassium now in range.   Skin tears on forearms healed.  Left foot 3rd toe lesion. Present for 2 years. Will refer to podiatrist.  Htn bp is borderlne controlled today. I want you to get new bp cuff and check bp daily. Update me on readings in one week. Want to see bp closer to 130/80 so may advise increase to 10 mg dose.  Follow up in 3 month or sooner if needed.  Will put in future cmp to be done middle of November.

## 2022-05-01 ENCOUNTER — Ambulatory Visit: Payer: Federal, State, Local not specified - PPO | Admitting: Neurology

## 2022-05-01 ENCOUNTER — Telehealth: Payer: Self-pay | Admitting: Neurology

## 2022-05-01 ENCOUNTER — Encounter: Payer: Self-pay | Admitting: Neurology

## 2022-05-01 VITALS — BP 148/106 | HR 108 | Ht 63.0 in | Wt 164.0 lb

## 2022-05-01 DIAGNOSIS — R5383 Other fatigue: Secondary | ICD-10-CM | POA: Diagnosis not present

## 2022-05-01 DIAGNOSIS — Z79899 Other long term (current) drug therapy: Secondary | ICD-10-CM | POA: Diagnosis not present

## 2022-05-01 DIAGNOSIS — F1011 Alcohol abuse, in remission: Secondary | ICD-10-CM

## 2022-05-01 DIAGNOSIS — G35 Multiple sclerosis: Secondary | ICD-10-CM

## 2022-05-01 DIAGNOSIS — R3915 Urgency of urination: Secondary | ICD-10-CM

## 2022-05-01 DIAGNOSIS — R260 Ataxic gait: Secondary | ICD-10-CM | POA: Diagnosis not present

## 2022-05-01 MED ORDER — BACLOFEN 10 MG PO TABS: 10.0000 mg | ORAL_TABLET | Freq: Three times a day (TID) | ORAL | 11 refills | Status: DC | PRN

## 2022-05-01 MED ORDER — MODAFINIL 200 MG PO TABS: 200.0000 mg | ORAL_TABLET | Freq: Every day | ORAL | 5 refills | Status: DC

## 2022-05-01 MED ORDER — OXYBUTYNIN CHLORIDE 5 MG PO TABS: 5.0000 mg | ORAL_TABLET | Freq: Two times a day (BID) | ORAL | 11 refills | Status: DC

## 2022-05-01 NOTE — Telephone Encounter (Signed)
BCBS fed sent to GI

## 2022-05-01 NOTE — Progress Notes (Signed)
[facility]  PATIENT: Catherine Hubbard DOB: November 02, 1960   _________________________________   HISTORICAL  CHIEF COMPLAINT:  Chief Complaint  Patient presents with   Follow-up    Pt alone, rm 1. Pt states they are overall doing ok DMT Ocrevus. 05/14/22 is next schedule ocrevus. She states come the 5 mth time frame she notices that she is little more off balance.     HISTORY OF PRESENT ILLNESS:  Catherine Hubbard is a 61 y.o. woman with relapsing remitting MS.    Update 05/01/2022: Sheis on Ocrevus and denies any new exacerbations. She is tolerating it well.   She does feel worse with more weakness and reduced energy the last month of each cycle.   Her next infusion is next week.   She is walking reasonably well not needing a cane and has no recent falls.  She can do stairs carefully and needs to hold the rail.  She has mild weakness in her legs.    She denies numbness.   Vision is doing well.    She feels bladder function is unchanged with urinary urgency and no recent incontinence.  .    She reports being fatigued.  Modafinil helps some.  Mood has been stable.  She reports she is rarely drinking ETOH now.      MS History:   She was diagnosed with multiple sclerosis in 2009. Before that time, she had noted some gait ataxia but had not had any imaging studies. In 2009, due to progressive hearing loss, she had an MRI of the brain and  then had a lumbar puncture that also confirmed the diagnosis.  I have some of her early MRI reports there are several foci noted in the cervical and thoracic spinal cord in the brain, there were multiple hyperintense foci, mostly in the periventricular deep white matter many of these were contrast enhancing on 07/26/2008. There was another enhancing lesion adjacent to C2 in the spinal cord.  Initially, she was placed on Avonex. She did not feel good and she stopped. She then tried Copaxone but also stopped after a while. She moved here in  2014 and saw Dr. Renne Crigler at [facility]. She was placed on Rebif. She tolerates Rebif well.        IMAGING: MRI from 12/04/2012  showed multiple foci in the spinal cord at C2, C3-C4, and C4-C5. There were also foci at T4, T7, T9 and T10. The MRI of the brain showed 2 nonenhancing foci not present from studies in 2011. She reports having an MRI at [facility] 2015 but we do not have those films.  MRI cervical spine 07/22/2021 shows foci at C2, C4 and C5, seen previously.   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   MRI brain 11/16/2018 showed MS lesion with no progression compared to 2018.     REVIEW OF SYSTEMS:  Constitutional: No fevers, chills, sweats, or change in appetite.    She reports fatigue and insomnia Eyes: see above.  No eye pain  But some light sensitivity. Ear, nose and throat: No hearing loss, ear pain, nasal congestion, sore throat.   tinnitus Cardiovascular: No chest pain, palpitations Respiratory:  No shortness of breath at rest or with exertion.   Some wheezes, once on asthma med's Gastrointestinal: No nausea, vomiting, diarrhea, abdominal pain, fecal incontinence.  She has esophagitis Genitourinary:  see above Musculoskeletal:  see above  She also notes neck pain Integumentary: No rash, pruritus, skin lesions  Neurological: as above Psychiatric: as above Endocrine: No palpitations, diaphoresis, change in appetite, change in weigh or increased thirst.  Often feels cold Hematologic/Lymphatic:  No anemia, purpura, petechiae. Allergic/Immunologic: No itchy/runny eyes, nasal congestion, recent allergic reactions, rashes  ALLERGIES: Allergies  Allergen Reactions   Ibuprofen Other (See Comments)    Cannot take due to gastric bypass   Nsaids Other (See Comments)    Gastric bypass    HOME MEDICATIONS:  Current Outpatient Medications:    acetaminophen (TYLENOL) 500 MG tablet, Take 500 mg by mouth daily as needed (pain)., Disp: , Rfl:    albuterol (PROVENTIL)  (2.5 MG/3ML) 0.083% nebulizer solution, Take 3 mLs (2.5 mg total) by nebulization every 6 (six) hours as needed for wheezing or shortness of breath. (Patient taking differently: Take 2.5 mg by nebulization as needed for wheezing or shortness of breath.), Disp: 150 mL, Rfl: 1   albuterol (VENTOLIN HFA) 108 (90 Base) MCG/ACT inhaler, TAKE 2 PUFFS BY MOUTH EVERY 6 HOURS AS NEEDED FOR WHEEZE OR SHORTNESS OF BREATH (Patient taking differently: Inhale 1-2 puffs into the lungs 2 (two) times daily as needed for wheezing or shortness of breath.), Disp: 54 each, Rfl: 2   amitriptyline (ELAVIL) 10 MG tablet, TAKE 1 TABLET BY MOUTH EVERYDAY AT BEDTIME (Patient taking differently: Take 10 mg by mouth at bedtime as needed for sleep.), Disp: 90 tablet, Rfl: 1   amLODipine (NORVASC) 5 MG tablet, Take 1 tablet (5 mg total) by mouth daily., Disp: 90 tablet, Rfl: 1   Aspirin-Acetaminophen-Caffeine (EXCEDRIN PO), Take 1 tablet by mouth as needed (headache)., Disp: , Rfl:    bismuth subsalicylate (PEPTO BISMOL) 262 MG/15ML suspension, Take 30 mLs by mouth daily as needed (acid reflux)., Disp: , Rfl:    budesonide-formoterol (SYMBICORT) 80-4.5 MCG/ACT inhaler, TAKE 2 PUFFS BY MOUTH TWICE A DAY (Patient taking differently: Inhale 2 puffs into the lungs as needed (SOB).), Disp: 10.2 each, Rfl: 3   Calcium Carbonate Antacid (TUMS PO), Take 2 tablets by mouth 2 (two) times daily as needed (acid reflux)., Disp: , Rfl:    Cholecalciferol (D2000 ULTRA STRENGTH) 50 MCG (2000 UT) CAPS, Take 2,000 Units by mouth daily., Disp: , Rfl:    diclofenac Sodium (VOLTAREN) 1 % GEL, APPLY 4 G TOPICALLY 4 (FOUR) TIMES DAILY. (Patient taking differently: Apply 1 Application topically 4 (four) times daily as needed (pain).), Disp: 400 g, Rfl: 1   FLUoxetine (PROZAC) 40 MG capsule, TAKE 1 CAPSULE BY MOUTH EVERY DAY (Patient taking differently: Take 40 mg by mouth daily.), Disp: 30 capsule, Rfl: 3   fluticasone (FLONASE) 50 MCG/ACT nasal spray, SPRAY  2 SPRAYS INTO EACH NOSTRIL EVERY DAY (Patient taking differently: Place 2 sprays into both nostrils as needed for allergies.), Disp: 16 mL, Rfl: 12   gabapentin (NEURONTIN) 100 MG capsule, TAKE 1 CAPSULE (100 MG TOTAL) BY MOUTH 3 TIMES A DAY (Patient taking differently: Take 100 mg by mouth daily as needed (nerve pain).), Disp: 90 capsule, Rfl: 0   meloxicam (MOBIC) 7.5 MG tablet, Take 1 tablet (7.5 mg total) by mouth daily., Disp: 30 tablet, Rfl: 2   Multiple Vitamin (MULTIVITAMIN WITH MINERALS) TABS tablet, Take 1 tablet by mouth daily., Disp: , Rfl:    mupirocin ointment (BACTROBAN) 2 %, Apply 1 Application topically 2 (two) times daily., Disp: 22 g, Rfl: 2   OCREVUS 300 MG/10ML injection, Inject 20 mLs (600 mg total) into the vein every 6 (six) months., Disp: 20 mL, Rfl: 1   omeprazole (PRILOSEC) 20  MG capsule, Take 40 mg by mouth 2 (two) times daily before a meal., Disp: , Rfl:    venlafaxine XR (EFFEXOR-XR) 37.5 MG 24 hr capsule, TAKE 1 CAPSULE BY MOUTH DAILY WITH BREAKFAST. PATIENT NEEDS APPOINTMENT BEFORE ANY FURTHER REFILLS (Patient taking differently: Take 37.5 mg by mouth daily.), Disp: 90 capsule, Rfl: 1   baclofen (LIORESAL) 10 MG tablet, Take 1 tablet (10 mg total) by mouth 3 (three) times daily as needed for muscle spasms., Disp: 90 tablet, Rfl: 11   modafinil (PROVIGIL) 200 MG tablet, Take 1 tablet (200 mg total) by mouth daily., Disp: 30 tablet, Rfl: 5   oxybutynin (DITROPAN) 5 MG tablet, Take 1 tablet (5 mg total) by mouth 2 (two) times daily., Disp: 60 tablet, Rfl: 11  PAST MEDICAL HISTORY: Past Medical History:  Diagnosis Date   Alcohol abuse    Asthma    Esophagitis    GERD (gastroesophageal reflux disease)    Headache    Hearing loss    Hypertension    Multiple sclerosis (HCC)    Pancreatitis    Vision abnormalities     PAST SURGICAL HISTORY: Past Surgical History:  Procedure Laterality Date   ANKLE FRACTURE SURGERY Bilateral    CARPAL TUNNEL RELEASE Bilateral     CHOLECYSTECTOMY     COLONOSCOPY  11/2016   multiple   ESOPHAGOGASTRODUODENOSCOPY  11/2016   ESOPHAGOGASTRODUODENOSCOPY N/A 02/01/2017   Procedure: ESOPHAGOGASTRODUODENOSCOPY (EGD);  Surgeon: Iva Boop, MD;  Location: [facility] ENDOSCOPY;  Service: Endoscopy;  Laterality: N/A;   ESOPHAGOGASTRODUODENOSCOPY (EGD) WITH PROPOFOL N/A 02/04/2017   Procedure: ESOPHAGOGASTRODUODENOSCOPY (EGD) WITH PROPOFOL;  Surgeon: Meryl Dare, MD;  Location: [facility] ENDOSCOPY;  Service: Endoscopy;  Laterality: N/A;   LAPAROSCOPIC GASTRIC SLEEVE RESECTION  2011   ULNAR NERVE REPAIR Bilateral     FAMILY HISTORY: Family History  Problem Relation Age of Onset   Cancer Mother    Stroke Mother    Cancer Father     SOCIAL HISTORY:  Social History   Socioeconomic History   Marital status: Married    Spouse name: Not on file   Number of children: Not on file   Years of education: Not on file   Highest education level: Not on file  Occupational History   Not on file  Tobacco Use   Smoking status: Never   Smokeless tobacco: Never   Tobacco comments:    second hand smoker when young. Heavy exposure.  Vaping Use   Vaping Use: Never used  Substance and Sexual Activity   Alcohol use: Yes    Comment: daily   Drug use: No   Sexual activity: Not Currently  Other Topics Concern   Not on file  Social History Narrative   Not on file   Social Determinants of Health   Financial Resource Strain: Not on file  Food Insecurity: Not on file  Transportation Needs: Not on file  Physical Activity: Not on file  Stress: Not on file  Social Connections: Not on file  Intimate Partner Violence: Not on file     PHYSICAL EXAM  Vitals:   05/01/22 1046  BP: (!) 148/106  Pulse: (!) 108  Weight: 164 lb (74.4 kg)  Height: 5\' 3"  (1.6 m)    Body mass index is 29.05 kg/m.   General: The patient is well-developed and well-nourished and in no acute distress.     Neck/HEENT:   She has mild tenderness in the  cervical paraspinal muscles.      Extremities:  Mild pedal edema   Neurologic Exam  Mental status: The patient is alert and oriented x 3 at the time of the examination. The patient has apparent normal recent and remote memory, with an apparently normal attention span and concentration ability.   Speech is normal.  Cranial nerves: Extraocular movements are full.  . She reports reduced sensation in the right face.  Facial strength was normal.  The trapezius strength is normal.      Motor:  Muscle bulk is normal.   Tone is normal in arms and mildly increased in the legs..  Strength is 5/5 in the arms and legs.  Sensory: She has symmetric sensation to touch and vibration in the arms and proximal legs.  Coordination: Cerebellar testing shows good finger-nose-finger and right heel-to-shin but mildly reduced left heel-to-shin.  Gait and station: Station is normal.  The gait is wide.  She has a poor tandem gait and just does 1 step before needing to hold onto.  Romberg is negative..  Reflexes: Deep tendon reflexes are symmetric and mildly increased in legs bilaterally.   No ankle clonus      ASSESSMENT AND PLAN  Relapsing remitting multiple sclerosis (HCC) - Plan: CD20 B Cells, IgG, IgA, IgM, CBC with Differential/Platelet, MR BRAIN WO CONTRAST  High risk medication use - Plan: CD20 B Cells, IgG, IgA, IgM, CBC with Differential/Platelet  Ataxic gait - Plan: MR BRAIN WO CONTRAST  Other fatigue  Urinary urgency  History of alcohol abuse   1.    For MS, continue Ocrevus.  We will check lab work for IgG/IgM and CBC with differential and CD20 . 2.    Continue baclofen and Provigil and oxybutynon. 3.    MRI brain to determine if any progression and consider a different DMT if occurring 4.    She will return to see me in 4 months or sooner if she has new or worsening neurologic symptoms.  45-minute office visit with the majority of the time spent face-to-face for history and physical,  discussion/counseling and decision-making.  Additional time with record review and documentation.    Karys Meckley A. Epimenio Foot, MD, PhD, FAAN Certified in Neurology, Clinical Neurophysiology, Sleep Medicine, Pain Medicine and Neuroimaging Director, [facility]  [facility] [address] [place], [place] [address] [phone]

## 2022-05-03 LAB — CBC WITH DIFFERENTIAL/PLATELET
Basophils Absolute: 0.1 10*3/uL (ref 0.0–0.2)
Basos: 2 %
EOS (ABSOLUTE): 0.4 10*3/uL (ref 0.0–0.4)
Eos: 4 %
Hematocrit: 33.2 % — ABNORMAL LOW (ref 34.0–46.6)
Hemoglobin: 10.8 g/dL — ABNORMAL LOW (ref 11.1–15.9)
Immature Grans (Abs): 0 10*3/uL (ref 0.0–0.1)
Immature Granulocytes: 0 %
Lymphocytes Absolute: 1.5 10*3/uL (ref 0.7–3.1)
Lymphs: 17 %
MCH: 28.1 pg (ref 26.6–33.0)
MCHC: 32.5 g/dL (ref 31.5–35.7)
MCV: 86 fL (ref 79–97)
Monocytes Absolute: 1.5 10*3/uL — ABNORMAL HIGH (ref 0.1–0.9)
Monocytes: 17 %
Neutrophils Absolute: 5.3 10*3/uL (ref 1.4–7.0)
Neutrophils: 60 %
Platelets: 508 10*3/uL — ABNORMAL HIGH (ref 150–450)
RBC: 3.85 x10E6/uL (ref 3.77–5.28)
RDW: 14.6 % (ref 11.7–15.4)
WBC: 8.8 10*3/uL (ref 3.4–10.8)

## 2022-05-03 LAB — CD20 B CELLS
% CD19-B Cells: 0 % — ABNORMAL LOW (ref 4.6–22.1)
% CD20-B Cells: 0 % — ABNORMAL LOW (ref 5.0–22.3)

## 2022-05-03 LAB — IGG, IGA, IGM
IgA/Immunoglobulin A, Serum: 289 mg/dL (ref 87–352)
IgG (Immunoglobin G), Serum: 898 mg/dL (ref 586–1602)
IgM (Immunoglobulin M), Srm: 168 mg/dL (ref 26–217)

## 2022-05-14 DIAGNOSIS — G35 Multiple sclerosis: Secondary | ICD-10-CM | POA: Diagnosis not present

## 2022-05-21 ENCOUNTER — Telehealth: Payer: Self-pay

## 2022-05-21 NOTE — Telephone Encounter (Signed)
Caller Name Dannisha Eckmann Caller Phone Number [phone] Patient Name Catherine Hubbard Patient DOB 03-Nov-1960 Call Type Message Only Information Provided Reason for Call Request to Schedule Office Appointment Initial Comment Caller needs to get an appt. Her husband has come down with shingles and poison ivy. She said the other day she was all scratchy on her chest. Dots on the front of her chest. Patient request to speak to RN No Additional Comment Declined triage. Disp. Time Disposition Final User 05/20/2022 7:16:34 PM General Information Provided Yes Tyler Deis Call Closed By: Tyler Deis Transaction Date/Time: 05/20/2022 7:12:12 PM (ET)

## 2022-05-21 NOTE — Telephone Encounter (Signed)
Pt called and lvm to return call to schedule an appt 

## 2022-05-24 ENCOUNTER — Other Ambulatory Visit: Payer: Self-pay | Admitting: Physical Medicine and Rehabilitation

## 2022-05-24 ENCOUNTER — Other Ambulatory Visit: Payer: Self-pay

## 2022-05-24 MED ORDER — MELOXICAM 7.5 MG PO TABS: 7.5000 mg | ORAL_TABLET | Freq: Every day | ORAL | 2 refills | Status: DC

## 2022-06-21 ENCOUNTER — Encounter
Payer: Federal, State, Local not specified - PPO | Attending: Physical Medicine and Rehabilitation | Admitting: Physical Medicine and Rehabilitation

## 2022-06-21 DIAGNOSIS — M5481 Occipital neuralgia: Secondary | ICD-10-CM | POA: Diagnosis not present

## 2022-06-21 MED ORDER — DICLOFENAC SODIUM 1 % EX GEL
4.0000 g | Freq: Four times a day (QID) | CUTANEOUS | 1 refills | Status: DC
Start: 2022-06-21 — End: 2024-04-26

## 2022-06-21 MED ORDER — BUPIVACAINE HCL (PF) 0.5 % IJ SOLN
4.0000 mL | Freq: Once | INTRAMUSCULAR | Status: AC
  Administered 2022-06-21: 4 mL

## 2022-06-21 NOTE — Progress Notes (Signed)
Occipital nerve block procedure note: bilateral greater and lesser occipital nerves  Written consent was obtained for the patient. Location for greater and lesser occipital nerves was determined using bony landmarks and points of maximal sensitivity.  The areas were cleaned with alcohol, vapocoolant spray applied, needle drawback performed, and a total of 4 cc of 0.5% Marcaine was injected to both these sites.  There were no complications from the procedure, and it was well tolerated.   

## 2022-07-30 ENCOUNTER — Telehealth: Payer: Self-pay | Admitting: Medical

## 2022-07-30 NOTE — Telephone Encounter (Signed)
Pt stated she has been dealing with swelling all the way up her shins. Scheduled her pcp's first opening on Wed and attempted to transfer to triage for nurse eval. Pt disconnected.

## 2022-08-01 ENCOUNTER — Encounter: Payer: Self-pay | Admitting: Medical

## 2022-08-01 ENCOUNTER — Ambulatory Visit (HOSPITAL_BASED_OUTPATIENT_CLINIC_OR_DEPARTMENT_OTHER)
Admission: RE | Admit: 2022-08-01 | Discharge: 2022-08-01 | Disposition: A | Discharge status: Home / Self Care | Payer: Federal, State, Local not specified - PPO | Source: Ambulatory Visit | Attending: Medical | Admitting: Medical

## 2022-08-01 ENCOUNTER — Other Ambulatory Visit (HOSPITAL_BASED_OUTPATIENT_CLINIC_OR_DEPARTMENT_OTHER): Payer: Self-pay

## 2022-08-01 ENCOUNTER — Ambulatory Visit: Payer: Federal, State, Local not specified - PPO | Admitting: Medical

## 2022-08-01 VITALS — BP 130/80 | HR 78 | Temp 98.2°F | Resp 18 | Ht 63.0 in | Wt 171.0 lb

## 2022-08-01 DIAGNOSIS — I10 Essential (primary) hypertension: Secondary | ICD-10-CM

## 2022-08-01 DIAGNOSIS — R600 Localized edema: Secondary | ICD-10-CM | POA: Diagnosis not present

## 2022-08-01 DIAGNOSIS — Z23 Encounter for immunization: Secondary | ICD-10-CM

## 2022-08-01 DIAGNOSIS — K449 Diaphragmatic hernia without obstruction or gangrene: Secondary | ICD-10-CM | POA: Diagnosis not present

## 2022-08-01 LAB — COMPREHENSIVE METABOLIC PANEL
ALT: 11 U/L (ref 0–35)
AST: 25 U/L (ref 0–37)
Albumin: 4 g/dL (ref 3.5–5.2)
Alkaline Phosphatase: 84 U/L (ref 39–117)
BUN: 8 mg/dL (ref 6–23)
CO2: 35 mEq/L — ABNORMAL HIGH (ref 19–32)
Calcium: 9.5 mg/dL (ref 8.4–10.5)
Chloride: 89 mEq/L — ABNORMAL LOW (ref 96–112)
Creatinine, Ser: 0.6 mg/dL (ref 0.40–1.20)
GFR: 96.42 mL/min (ref >60.00)
Glucose, Bld: 89 mg/dL (ref 70–99)
Potassium: 4.1 mEq/L (ref 3.5–5.1)
Sodium: 132 mEq/L — ABNORMAL LOW (ref 135–145)
Total Bilirubin: 0.8 mg/dL (ref 0.2–1.2)
Total Protein: 6.5 g/dL (ref 6.0–8.3)

## 2022-08-01 LAB — BRAIN NATRIURETIC PEPTIDE: Pro B Natriuretic peptide (BNP): 174 pg/mL — ABNORMAL HIGH (ref 0.0–100.0)

## 2022-08-01 MED ORDER — AREXVY 120 MCG/0.5ML IM SUSR
INTRAMUSCULAR | 0 refills | Status: DC
  Filled 2022-08-01: qty 0.5, 1d supply, fill #0

## 2022-08-01 NOTE — Progress Notes (Signed)
Subjective:    Patient ID: Catherine Hubbard, female    DOB: 08-09-1961, 61 y.o.   MRN: [id number]  HPI Pt in with bilateral lower ext swelling complaint. Her legs area more swollen than usual.  In the past 3 yrs ago mild bnp elevation. Pt cxr in 05-2021 showed not chf. Edema/swelling worse at end of the day. In the morning swelling is down. No sob on sleeping. 02 sat is 100%. Weight is about 6 lb more than September.   Htn- pt was on amlodipine 5 mg daily. Pt tells me she is no longer on that but did restart olmesartan. This was dc'd by hosptial in the summer.   Review of Systems  Constitutional:  Negative for chills, fatigue and fever.  Respiratory:  Negative for cough, chest tightness, shortness of breath and wheezing.   Cardiovascular:  Negative for chest pain and palpitations.  Gastrointestinal:  Negative for abdominal pain, constipation and nausea.  Genitourinary:  Negative for difficulty urinating, dysuria, flank pain and frequency.  Musculoskeletal:  Negative for back pain, gait problem, joint swelling and neck stiffness.  Skin:  Negative for rash.  Neurological:  Negative for dizziness, speech difficulty, weakness, numbness and headaches.  Hematological:  Negative for adenopathy. Does not bruise/bleed easily.  Psychiatric/Behavioral:  Negative for behavioral problems and decreased concentration.    Past Medical History:  Diagnosis Date   Alcohol abuse    Asthma    Esophagitis    GERD (gastroesophageal reflux disease)    Headache    Hearing loss    Hypertension    Multiple sclerosis (HCC)    Pancreatitis    Vision abnormalities      Social History   Socioeconomic History   Marital status: Married    Spouse name: Not on file   Number of children: Not on file   Years of education: Not on file   Highest education level: Not on file  Occupational History   Not on file  Tobacco Use   Smoking status: Never   Smokeless tobacco: Never   Tobacco comments:     second hand smoker when young. Heavy exposure.  Vaping Use   Vaping Use: Never used  Substance and Sexual Activity   Alcohol use: Yes    Comment: daily   Drug use: No   Sexual activity: Not Currently  Other Topics Concern   Not on file  Social History Narrative   Not on file   Social Determinants of Health   Financial Resource Strain: Not on file  Food Insecurity: Not on file  Transportation Needs: Not on file  Physical Activity: Not on file  Stress: Not on file  Social Connections: Not on file  Intimate Partner Violence: Not on file    Past Surgical History:  Procedure Laterality Date   ANKLE FRACTURE SURGERY Bilateral    CARPAL TUNNEL RELEASE Bilateral    CHOLECYSTECTOMY     COLONOSCOPY  11/2016   multiple   ESOPHAGOGASTRODUODENOSCOPY  11/2016   ESOPHAGOGASTRODUODENOSCOPY N/A 02/01/2017   Procedure: ESOPHAGOGASTRODUODENOSCOPY (EGD);  Surgeon: Iva Boop, MD;  Location: [facility] ENDOSCOPY;  Service: Endoscopy;  Laterality: N/A;   ESOPHAGOGASTRODUODENOSCOPY (EGD) WITH PROPOFOL N/A 02/04/2017   Procedure: ESOPHAGOGASTRODUODENOSCOPY (EGD) WITH PROPOFOL;  Surgeon: Meryl Dare, MD;  Location: [facility] ENDOSCOPY;  Service: Endoscopy;  Laterality: N/A;   LAPAROSCOPIC GASTRIC SLEEVE RESECTION  2011   ULNAR NERVE REPAIR Bilateral     Family History  Problem Relation Age of Onset   Cancer Mother  Stroke Mother    Cancer Father     Allergies  Allergen Reactions   Ibuprofen Other (See Comments)    Cannot take due to gastric bypass   Nsaids Other (See Comments)    Gastric bypass    Current Outpatient Medications on File Prior to Visit  Medication Sig Dispense Refill   acetaminophen (TYLENOL) 500 MG tablet Take 500 mg by mouth daily as needed (pain).     albuterol (PROVENTIL) (2.5 MG/3ML) 0.083% nebulizer solution Take 3 mLs (2.5 mg total) by nebulization every 6 (six) hours as needed for wheezing or shortness of breath. (Patient taking differently: Take 2.5 mg by  nebulization as needed for wheezing or shortness of breath.) 150 mL 1   albuterol (VENTOLIN HFA) 108 (90 Base) MCG/ACT inhaler TAKE 2 PUFFS BY MOUTH EVERY 6 HOURS AS NEEDED FOR WHEEZE OR SHORTNESS OF BREATH (Patient taking differently: Inhale 1-2 puffs into the lungs 2 (two) times daily as needed for wheezing or shortness of breath.) 54 each 2   amitriptyline (ELAVIL) 10 MG tablet TAKE 1 TABLET BY MOUTH EVERYDAY AT BEDTIME (Patient taking differently: Take 10 mg by mouth at bedtime as needed for sleep.) 90 tablet 1   amLODipine (NORVASC) 5 MG tablet Take 1 tablet (5 mg total) by mouth daily. 90 tablet 1   Aspirin-Acetaminophen-Caffeine (EXCEDRIN PO) Take 1 tablet by mouth as needed (headache).     baclofen (LIORESAL) 10 MG tablet Take 1 tablet (10 mg total) by mouth 3 (three) times daily as needed for muscle spasms. 90 tablet 11   bismuth subsalicylate (PEPTO BISMOL) 262 MG/15ML suspension Take 30 mLs by mouth daily as needed (acid reflux).     budesonide-formoterol (SYMBICORT) 80-4.5 MCG/ACT inhaler TAKE 2 PUFFS BY MOUTH TWICE A DAY (Patient taking differently: Inhale 2 puffs into the lungs as needed (SOB).) 10.2 each 3   Calcium Carbonate Antacid (TUMS PO) Take 2 tablets by mouth 2 (two) times daily as needed (acid reflux).     Cholecalciferol (D2000 ULTRA STRENGTH) 50 MCG (2000 UT) CAPS Take 2,000 Units by mouth daily.     diclofenac Sodium (VOLTAREN) 1 % GEL Apply 4 g topically 4 (four) times daily. 400 g 1   FLUoxetine (PROZAC) 40 MG capsule TAKE 1 CAPSULE BY MOUTH EVERY DAY (Patient taking differently: Take 40 mg by mouth daily.) 30 capsule 3   fluticasone (FLONASE) 50 MCG/ACT nasal spray SPRAY 2 SPRAYS INTO EACH NOSTRIL EVERY DAY (Patient taking differently: Place 2 sprays into both nostrils as needed for allergies.) 16 mL 12   gabapentin (NEURONTIN) 100 MG capsule TAKE 1 CAPSULE (100 MG TOTAL) BY MOUTH 3 TIMES A DAY (Patient taking differently: Take 100 mg by mouth daily as needed (nerve pain).)  90 capsule 0   meloxicam (MOBIC) 7.5 MG tablet Take 1 tablet (7.5 mg total) by mouth daily. 30 tablet 2   modafinil (PROVIGIL) 200 MG tablet Take 1 tablet (200 mg total) by mouth daily. 30 tablet 5   Multiple Vitamin (MULTIVITAMIN WITH MINERALS) TABS tablet Take 1 tablet by mouth daily.     mupirocin ointment (BACTROBAN) 2 % Apply 1 Application topically 2 (two) times daily. 22 g 2   OCREVUS 300 MG/10ML injection Inject 20 mLs (600 mg total) into the vein every 6 (six) months. 20 mL 1   omeprazole (PRILOSEC) 20 MG capsule Take 40 mg by mouth 2 (two) times daily before a meal.     oxybutynin (DITROPAN) 5 MG tablet Take 1 tablet (5 mg  total) by mouth 2 (two) times daily. 60 tablet 11   venlafaxine XR (EFFEXOR-XR) 37.5 MG 24 hr capsule Take 1 capsule (37.5 mg total) by mouth daily. 90 capsule 1   No current facility-administered medications on file prior to visit.    BP (!) 140/84   Pulse 78   Temp 98.2 F (36.8 C)   Resp 18   Ht 5\' 3"  (1.6 m)   Wt 171 lb (77.6 kg)   LMP 08/13/2014 Comment: very few periods  SpO2 100%   BMI 30.29 kg/m        Objective:   Physical Exam General Mental Status- Alert. General Appearance- Not in acute distress.   Skin General: Color- Normal Color. Moisture- Normal Moisture.  Neck Carotid Arteries- Normal color. Moisture- Normal Moisture. No carotid bruits. No JVD.  Chest and Lung Exam Auscultation: Breath Sounds:-Normal.  Cardiovascular Auscultation:Rythm- Regular. Murmurs & Other Heart Sounds:Auscultation of the heart reveals- No Murmurs.  Abdomen Inspection:-Inspeection Normal. Palpation/Percussion:Note:No mass. Palpation and Percussion of the abdomen reveal- Non Tender, Non Distended + BS, no rebound or guarding.  Neurologic Cranial Nerve exam:- CN III-XII intact(No nystagmus), symmetric smile. Strength:- 5/5 equal and symmetric strength both upper and lower extremities.   Lower ext- 1 + pedal edema bilaterally. Symmetric. Negtive  homans signs.      Assessment & Plan:   Patient Instructions  Htn- your bp is better on recheck. You report you stopped amlodipine and restarted olmesartan. I don't see that on your old med list though [facility] note in past stated stop ARB. Very important that you let me know which bp med you are using so can document/update the med list.  For bilateral symmetric lower ext edema will get cmp, bnp and cxr. You may have dependant edema. But need to evaluate if any chf. If no chf advise compression socks and elevated legs. May need low diuretic to use on occasion.  Follow up date to be determined after lab review and xray review.   08/15/2014, PA-C

## 2022-08-01 NOTE — Patient Instructions (Addendum)
Htn- your bp is better on recheck. You report you stopped amlodipine and restarted olmesartan. I don't see that on your old med list though [facility] note in past stated stop ARB. Very important that you let me know which bp med you are using so can document/update the med list.  For bilateral symmetric lower ext edema will get cmp, bnp and cxr. You may have dependant edema. But need to evaluate if any chf. If no chf advise compression socks and elevated legs. May need low diuretic to use on occasion.  Follow up date to be determined after lab review and xray review.

## 2022-08-01 NOTE — Addendum Note (Signed)
Addended by: Maximino Sarin on: 08/01/2022 11:39 AM   Modules accepted: Orders

## 2022-08-02 ENCOUNTER — Telehealth: Payer: Self-pay | Admitting: Medical

## 2022-08-02 NOTE — Telephone Encounter (Signed)
Pt called back and lvm to return call

## 2022-08-02 NOTE — Telephone Encounter (Signed)
Pt was requesting to go over labs.

## 2022-08-03 NOTE — Addendum Note (Signed)
Addended by: Gwenevere Abbot on: 08/03/2022 05:26 PM   Modules accepted: Orders

## 2022-08-06 ENCOUNTER — Other Ambulatory Visit: Payer: Self-pay | Admitting: Medical

## 2022-09-07 ENCOUNTER — Ambulatory Visit: Payer: Federal, State, Local not specified - PPO | Admitting: Medical

## 2022-09-07 ENCOUNTER — Encounter: Payer: Self-pay | Admitting: Medical

## 2022-09-07 ENCOUNTER — Other Ambulatory Visit: Payer: Federal, State, Local not specified - PPO

## 2022-09-07 VITALS — BP 140/70 | HR 83 | Resp 18 | Ht 63.0 in | Wt 167.0 lb

## 2022-09-07 DIAGNOSIS — E871 Hypo-osmolality and hyponatremia: Secondary | ICD-10-CM

## 2022-09-07 DIAGNOSIS — R300 Dysuria: Secondary | ICD-10-CM

## 2022-09-07 DIAGNOSIS — R609 Edema, unspecified: Secondary | ICD-10-CM | POA: Diagnosis not present

## 2022-09-07 LAB — COMPREHENSIVE METABOLIC PANEL
ALT: 10 U/L (ref 0–35)
AST: 22 U/L (ref 0–37)
Albumin: 4 g/dL (ref 3.5–5.2)
Alkaline Phosphatase: 79 U/L (ref 39–117)
BUN: 5 mg/dL — ABNORMAL LOW (ref 6–23)
CO2: 27 mEq/L (ref 19–32)
Calcium: 9.6 mg/dL (ref 8.4–10.5)
Chloride: 99 mEq/L (ref 96–112)
Creatinine, Ser: 0.69 mg/dL (ref 0.40–1.20)
GFR: 93.16 mL/min (ref >60.00)
Glucose, Bld: 92 mg/dL (ref 70–99)
Potassium: 4.2 mEq/L (ref 3.5–5.1)
Sodium: 137 mEq/L (ref 135–145)
Total Bilirubin: 0.6 mg/dL (ref 0.2–1.2)
Total Protein: 6.7 g/dL (ref 6.0–8.3)

## 2022-09-07 LAB — POC URINALSYSI DIPSTICK (AUTOMATED)
Bilirubin, UA: NEGATIVE
Blood, UA: NEGATIVE
Glucose, UA: NEGATIVE
Ketones, UA: NEGATIVE
Nitrite, UA: POSITIVE
Protein, UA: NEGATIVE
Spec Grav, UA: <=1.005 — AB (ref 1.010–1.025)
Urobilinogen, UA: 0.2 E.U./dL
pH, UA: 5 (ref 5.0–8.0)

## 2022-09-07 MED ORDER — CIPROFLOXACIN HCL 250 MG PO TABS: 250.0000 mg | ORAL_TABLET | Freq: Two times a day (BID) | ORAL | 0 refills | Status: AC

## 2022-09-07 MED ORDER — ONDANSETRON HCL 4 MG PO TABS: 4.0000 mg | ORAL_TABLET | Freq: Three times a day (TID) | ORAL | 0 refills | Status: DC | PRN

## 2022-09-07 MED ORDER — GABAPENTIN 100 MG PO CAPS: 100.0000 mg | ORAL_CAPSULE | Freq: Three times a day (TID) | ORAL | 1 refills | Status: DC

## 2022-09-07 NOTE — Patient Instructions (Addendum)
Dysuria intermittent over past mont. Concern urinary tract infection. I am prescribing  cipro antibiotic  250 mg twice daily for 3 days. Hydrate well. I am sending out a urine culture. During the interim if your signs and symptoms worsen rather than improving please notify us. We will notify your when the culture results are back.  Dependant edema recently. Responded elevation as you lay in bed and compression socks. Exam good/close to normal wit no obvious edema. Bnp, cxr last month good. Therefore labs not indicated.  Jerrye Bushy- can continue otc proton pump inhibitor omeprazole and for associated nauseu rx zofran.  Follow up date to be determined after urine culture review.

## 2022-09-07 NOTE — Addendum Note (Signed)
Addended by: Anabel Halon on: 09/07/2022 08:30 AM   Modules accepted: Orders

## 2022-09-07 NOTE — Addendum Note (Signed)
Addended by: Anabel Halon on: 09/07/2022 08:31 AM   Modules accepted: Orders

## 2022-09-07 NOTE — Progress Notes (Signed)
Subjective:    Patient ID: Catherine Hubbard, female    DOB: 02-15-1961, 62 y.o.   MRN: [id number]  HPI Pt in today reporting urinary symptoms intermittent for one month. States she has pain when urinated or after urinating. At one point about 3-4 weeks ago odor to urine.    Dysuria- yes Frequent urination-yes earlier in month but not recently. Hesitancy-no Suprapubic pressure-mild Fever-no chills-no Nausea-no Vomiting-no CVA pain-no History of UTI-occasional.  Gross hematuria- no   Pt has bilateral swelling of legs. She has had this intermittentl in the past. She has been lying in bed a lot recently and wearing compression socks. Seems to have resolved presently.  Prior echo in past showed 2019.  IMPRESSIONS     1. The left ventricle has normal systolic function of 40-98%. The cavity  size was normal. There is mildly increased left ventricular wall  thickness. Echo evidence of pseudonormalization in diastolic relaxation.   2. The right ventricle has normal systolic function. The cavity was  normal. There is no increase in right ventricular wall thickness.   3. Left atrial size was severely dilated.   4. The mitral valve is normal in structure.   5. The tricuspid valve is normal in structure.   6. The aortic valve is normal in structure.   7. The pulmonic valve was normal in structure.   8. The ascending aorta and aortic root are normal in size and structure.    07-2022 bnp was low and cxr did not show chf type findings.    Review of Systems  Constitutional:  Negative for chills, fatigue and fever.  Respiratory:  Negative for cough, chest tightness, shortness of breath and wheezing.   Gastrointestinal:  Negative for abdominal pain, blood in stool, nausea and vomiting.       Intermittent gerd. Can continue otc ppi and for associated nausea rx zofran.  Genitourinary:  Positive for dysuria and frequency. Negative for urgency.       Frequent early in month.   Musculoskeletal:  Negative for back pain, myalgias and neck stiffness.  Neurological:  Negative for dizziness, numbness and headaches.  Hematological:  Negative for adenopathy. Does not bruise/bleed easily.  Psychiatric/Behavioral:  Negative for behavioral problems and confusion.    Past Medical History:  Diagnosis Date   Alcohol abuse    Asthma    Esophagitis    GERD (gastroesophageal reflux disease)    Headache    Hearing loss    Hypertension    Multiple sclerosis ([place])    Pancreatitis    Vision abnormalities      Social History   Socioeconomic History   Marital status: Married    Spouse name: Not on file   Number of children: Not on file   Years of education: Not on file   Highest education level: Not on file  Occupational History   Not on file  Tobacco Use   Smoking status: Never   Smokeless tobacco: Never   Tobacco comments:    second hand smoker when young. Heavy exposure.  Vaping Use   Vaping Use: Never used  Substance and Sexual Activity   Alcohol use: Yes    Comment: daily   Drug use: No   Sexual activity: Not Currently  Other Topics Concern   Not on file  Social History Narrative   Not on file   Social Determinants of Health   Financial Resource Strain: Not on file  Food Insecurity: Not on file  Transportation Needs:  Not on file  Physical Activity: Not on file  Stress: Not on file  Social Connections: Not on file  Intimate Partner Violence: Not on file    Past Surgical History:  Procedure Laterality Date   ANKLE FRACTURE SURGERY Bilateral    CARPAL TUNNEL RELEASE Bilateral    CHOLECYSTECTOMY     COLONOSCOPY  11/2016   multiple   ESOPHAGOGASTRODUODENOSCOPY  11/2016   ESOPHAGOGASTRODUODENOSCOPY N/A 02/01/2017   Procedure: ESOPHAGOGASTRODUODENOSCOPY (EGD);  Surgeon: Gatha Mayer, MD;  Location: [facility] ENDOSCOPY;  Service: Endoscopy;  Laterality: N/A;   ESOPHAGOGASTRODUODENOSCOPY (EGD) WITH PROPOFOL N/A 02/04/2017   Procedure:  ESOPHAGOGASTRODUODENOSCOPY (EGD) WITH PROPOFOL;  Surgeon: Ladene Artist, MD;  Location: [facility] ENDOSCOPY;  Service: Endoscopy;  Laterality: N/A;   LAPAROSCOPIC GASTRIC SLEEVE RESECTION  2011   ULNAR NERVE REPAIR Bilateral     Family History  Problem Relation Age of Onset   Cancer Mother    Stroke Mother    Cancer Father     Allergies  Allergen Reactions   Ibuprofen Other (See Comments)    Cannot take due to gastric bypass   Nsaids Other (See Comments)    Gastric bypass    Current Outpatient Medications on File Prior to Visit  Medication Sig Dispense Refill   acetaminophen (TYLENOL) 500 MG tablet Take 500 mg by mouth daily as needed (pain).     albuterol (PROVENTIL) (2.5 MG/3ML) 0.083% nebulizer solution Take 3 mLs (2.5 mg total) by nebulization every 6 (six) hours as needed for wheezing or shortness of breath. (Patient taking differently: Take 2.5 mg by nebulization as needed for wheezing or shortness of breath.) 150 mL 1   albuterol (VENTOLIN HFA) 108 (90 Base) MCG/ACT inhaler TAKE 2 PUFFS BY MOUTH EVERY 6 HOURS AS NEEDED FOR WHEEZE OR SHORTNESS OF BREATH (Patient taking differently: Inhale 1-2 puffs into the lungs 2 (two) times daily as needed for wheezing or shortness of breath.) 54 each 2   amitriptyline (ELAVIL) 10 MG tablet TAKE 1 TABLET BY MOUTH EVERYDAY AT BEDTIME (Patient taking differently: Take 10 mg by mouth at bedtime as needed for sleep.) 90 tablet 1   amLODipine (NORVASC) 5 MG tablet Take 1 tablet (5 mg total) by mouth daily. 90 tablet 1   Aspirin-Acetaminophen-Caffeine (EXCEDRIN PO) Take 1 tablet by mouth as needed (headache).     baclofen (LIORESAL) 10 MG tablet Take 1 tablet (10 mg total) by mouth 3 (three) times daily as needed for muscle spasms. 90 tablet 11   bismuth subsalicylate (PEPTO BISMOL) 262 MG/15ML suspension Take 30 mLs by mouth daily as needed (acid reflux).     budesonide-formoterol (SYMBICORT) 80-4.5 MCG/ACT inhaler TAKE 2 PUFFS BY MOUTH TWICE A DAY 10.2  each 3   Calcium Carbonate Antacid (TUMS PO) Take 2 tablets by mouth 2 (two) times daily as needed (acid reflux).     Cholecalciferol (D2000 ULTRA STRENGTH) 50 MCG (2000 UT) CAPS Take 2,000 Units by mouth daily.     diclofenac Sodium (VOLTAREN) 1 % GEL Apply 4 g topically 4 (four) times daily. 400 g 1   FLUoxetine (PROZAC) 40 MG capsule TAKE 1 CAPSULE BY MOUTH EVERY DAY (Patient taking differently: Take 40 mg by mouth daily.) 30 capsule 3   fluticasone (FLONASE) 50 MCG/ACT nasal spray SPRAY 2 SPRAYS INTO EACH NOSTRIL EVERY DAY (Patient taking differently: Place 2 sprays into both nostrils as needed for allergies.) 16 mL 12   gabapentin (NEURONTIN) 100 MG capsule TAKE 1 CAPSULE (100 MG TOTAL) BY MOUTH 3  TIMES A DAY (Patient taking differently: Take 100 mg by mouth daily as needed (nerve pain).) 90 capsule 0   meloxicam (MOBIC) 7.5 MG tablet Take 1 tablet (7.5 mg total) by mouth daily. 30 tablet 2   modafinil (PROVIGIL) 200 MG tablet Take 1 tablet (200 mg total) by mouth daily. 30 tablet 5   Multiple Vitamin (MULTIVITAMIN WITH MINERALS) TABS tablet Take 1 tablet by mouth daily.     mupirocin ointment (BACTROBAN) 2 % Apply 1 Application topically 2 (two) times daily. 22 g 2   OCREVUS 300 MG/10ML injection Inject 20 mLs (600 mg total) into the vein every 6 (six) months. 20 mL 1   omeprazole (PRILOSEC) 20 MG capsule Take 40 mg by mouth 2 (two) times daily before a meal.     oxybutynin (DITROPAN) 5 MG tablet Take 1 tablet (5 mg total) by mouth 2 (two) times daily. 60 tablet 11   RSV vaccine recomb adjuvanted (AREXVY) 120 MCG/0.5ML injection Inject into the muscle. 0.5 mL 0   venlafaxine XR (EFFEXOR-XR) 37.5 MG 24 hr capsule Take 1 capsule (37.5 mg total) by mouth daily. 90 capsule 1   No current facility-administered medications on file prior to visit.    BP (!) 140/70   Pulse 83   Resp 18   Ht 5\' 3"  (1.6 m)   Wt 167 lb (75.8 kg)   LMP 08/13/2014 Comment: very few periods  SpO2 98%   BMI 29.58  kg/m           Objective:   Physical Exam  General- No acute distress. Pleasant patient. Neck- Full range of motion, no jvd Lungs- Clear, even and unlabored. Heart- regular rate and rhythm. Neurologic- CNII- XII grossly intact.  Abdomen- soft, nt, nd, +bs, no rebound or guardning. Faint suprapubic tenderness.  Back- no cva tenderenss. Legs- calves symmetric. Negative homans signs. No obvious edema.      Assessment & Plan:   Patient Instructions  Dysuria intermittent over past mont. Concern urinary tract infection. I am prescribing  cipro antibiotic  250 mg twice daily for 3 days. Hydrate well. I am sending out a urine culture. During the interim if your signs and symptoms worsen rather than improving please notify 08/15/2014. We will notify your when the culture results are back.  Dependant edema recently. Responded elevation as you lay in bed and compression socks. Exam good/close to normal wit no obvious edema. Bnp, cxr last month good. Therefore labs not indicated.  Follow up date to be determined after urine culture review.

## 2022-09-09 LAB — URINE CULTURE
MICRO NUMBER:: 14448134
SPECIMEN QUALITY:: ADEQUATE

## 2022-09-10 MED ORDER — CIPROFLOXACIN HCL 250 MG PO TABS: 250.0000 mg | ORAL_TABLET | Freq: Two times a day (BID) | ORAL | 0 refills | Status: AC

## 2022-09-10 NOTE — Addendum Note (Signed)
Addended by: Anabel Halon on: 09/10/2022 09:19 AM   Modules accepted: Orders

## 2022-09-21 ENCOUNTER — Encounter: Payer: Self-pay | Admitting: Physical Medicine and Rehabilitation

## 2022-09-21 ENCOUNTER — Encounter
Payer: Federal, State, Local not specified - PPO | Attending: Physical Medicine and Rehabilitation | Admitting: Physical Medicine and Rehabilitation

## 2022-09-21 VITALS — BP 147/91 | HR 77 | Ht 63.0 in | Wt 166.0 lb

## 2022-09-21 DIAGNOSIS — M5481 Occipital neuralgia: Secondary | ICD-10-CM | POA: Diagnosis not present

## 2022-09-21 MED ORDER — BUPIVACAINE HCL 0.5 % IJ SOLN
4.0000 mL | Freq: Once | INTRAMUSCULAR | Status: AC
  Administered 2022-09-21: 4 mL

## 2022-09-21 NOTE — Progress Notes (Signed)
Occipital nerve block procedure note: bilateral greater and lesser occipital nerves  Written consent was obtained for the patient. Location for greater and lesser occipital nerves was determined using bony landmarks and points of maximal sensitivity.  The areas were cleaned with alcohol, vapocoolant spray applied, needle drawback performed, and a total of 4 cc of 0.5% Marcaine was injected to both these sites.  There were no complications from the procedure, and it was well tolerated.

## 2022-10-06 ENCOUNTER — Other Ambulatory Visit: Payer: Self-pay | Admitting: Physical Medicine and Rehabilitation

## 2022-10-09 ENCOUNTER — Other Ambulatory Visit: Payer: Self-pay | Admitting: Medical

## 2022-10-11 ENCOUNTER — Ambulatory Visit: Payer: Federal, State, Local not specified - PPO | Admitting: Medical

## 2022-10-11 ENCOUNTER — Encounter: Payer: Self-pay | Admitting: Medical

## 2022-10-11 ENCOUNTER — Ambulatory Visit (HOSPITAL_BASED_OUTPATIENT_CLINIC_OR_DEPARTMENT_OTHER)
Admission: RE | Admit: 2022-10-11 | Discharge: 2022-10-11 | Disposition: A | Discharge status: Home / Self Care | Payer: Federal, State, Local not specified - PPO | Source: Ambulatory Visit | Attending: Medical | Admitting: Medical

## 2022-10-11 VITALS — BP 138/80 | HR 78 | Temp 98.2°F | Resp 18 | Ht 63.0 in | Wt 170.0 lb

## 2022-10-11 DIAGNOSIS — L089 Local infection of the skin and subcutaneous tissue, unspecified: Secondary | ICD-10-CM | POA: Diagnosis not present

## 2022-10-11 DIAGNOSIS — I10 Essential (primary) hypertension: Secondary | ICD-10-CM | POA: Diagnosis not present

## 2022-10-11 DIAGNOSIS — R600 Localized edema: Secondary | ICD-10-CM | POA: Insufficient documentation

## 2022-10-11 DIAGNOSIS — Z1231 Encounter for screening mammogram for malignant neoplasm of breast: Secondary | ICD-10-CM

## 2022-10-11 DIAGNOSIS — B49 Unspecified mycosis: Secondary | ICD-10-CM | POA: Diagnosis not present

## 2022-10-11 DIAGNOSIS — M25562 Pain in left knee: Secondary | ICD-10-CM

## 2022-10-11 DIAGNOSIS — T148XXA Other injury of unspecified body region, initial encounter: Secondary | ICD-10-CM | POA: Diagnosis not present

## 2022-10-11 DIAGNOSIS — S2231XA Fracture of one rib, right side, initial encounter for closed fracture: Secondary | ICD-10-CM | POA: Diagnosis not present

## 2022-10-11 DIAGNOSIS — M25462 Effusion, left knee: Secondary | ICD-10-CM | POA: Diagnosis not present

## 2022-10-11 DIAGNOSIS — D649 Anemia, unspecified: Secondary | ICD-10-CM

## 2022-10-11 LAB — CBC WITH DIFFERENTIAL/PLATELET
Basophils Absolute: 0.1 10*3/uL (ref 0.0–0.1)
Basophils Relative: 1.4 % (ref 0.0–3.0)
Eosinophils Absolute: 0.4 10*3/uL (ref 0.0–0.7)
Eosinophils Relative: 4.2 % (ref 0.0–5.0)
HCT: 27.3 % — ABNORMAL LOW (ref 36.0–46.0)
Hemoglobin: 8.7 g/dL — ABNORMAL LOW (ref 12.0–15.0)
Lymphocytes Relative: 24.1 % (ref 12.0–46.0)
Lymphs Abs: 2.2 10*3/uL (ref 0.7–4.0)
MCHC: 31.8 g/dL (ref 30.0–36.0)
MCV: 80.4 fl (ref 78.0–100.0)
Monocytes Absolute: 1.2 10*3/uL — ABNORMAL HIGH (ref 0.1–1.0)
Monocytes Relative: 13.3 % — ABNORMAL HIGH (ref 3.0–12.0)
Neutro Abs: 5.2 10*3/uL (ref 1.4–7.7)
Neutrophils Relative %: 57 % (ref 43.0–77.0)
Platelets: 445 10*3/uL — ABNORMAL HIGH (ref 150.0–400.0)
RBC: 3.39 Mil/uL — ABNORMAL LOW (ref 3.87–5.11)
RDW: 19 % — ABNORMAL HIGH (ref 11.5–15.5)
WBC: 9.1 10*3/uL (ref 4.0–10.5)

## 2022-10-11 LAB — COMPREHENSIVE METABOLIC PANEL
ALT: 10 U/L (ref 0–35)
AST: 19 U/L (ref 0–37)
Albumin: 4.1 g/dL (ref 3.5–5.2)
Alkaline Phosphatase: 78 U/L (ref 39–117)
BUN: 14 mg/dL (ref 6–23)
CO2: 32 mEq/L (ref 19–32)
Calcium: 9.9 mg/dL (ref 8.4–10.5)
Chloride: 99 mEq/L (ref 96–112)
Creatinine, Ser: 0.65 mg/dL (ref 0.40–1.20)
GFR: 94.45 mL/min (ref >60.00)
Glucose, Bld: 86 mg/dL (ref 70–99)
Potassium: 4.5 mEq/L (ref 3.5–5.1)
Sodium: 139 mEq/L (ref 135–145)
Total Bilirubin: 0.6 mg/dL (ref 0.2–1.2)
Total Protein: 6.5 g/dL (ref 6.0–8.3)

## 2022-10-11 LAB — BRAIN NATRIURETIC PEPTIDE: Pro B Natriuretic peptide (BNP): 186 pg/mL — ABNORMAL HIGH (ref 0.0–100.0)

## 2022-10-11 MED ORDER — NYSTATIN 100000 UNIT/GM EX CREA: 1.0000 | TOPICAL_CREAM | Freq: Two times a day (BID) | CUTANEOUS | 1 refills | Status: DC

## 2022-10-11 NOTE — Progress Notes (Signed)
Subjective:    Patient ID: Catherine Hubbard, female    DOB: 01-29-1961, 62 y.o.   MRN: [id number]  HPI  Pt in for follow up.  Pt has recent pedal edema. Pt weight is 170 lb which is in her historical range. Some minimal shortness of breath off and on. Pt has no shortness of breath on sleeping. Lays on side when she sleeps.  Pt has compression socks. This morning diffucult to put on.   On review pt does have pedal edema in past. Bnp mild elevated. Last cxr  IMPRESSION: No active cardiopulmonary disease.    Recent rt and left groin rash and rt side abdomen rash. In past woud get rash intermittently but worse recently.  Pt has htn- bp is high initially. But on recheck 138/80. On olmasartan.   Lt knee pain- front aspect. Has been hurting for past 2 weeks. Pain on and off.e  Past Medical History:  Diagnosis Date   Alcohol abuse    Asthma    Esophagitis    GERD (gastroesophageal reflux disease)    Headache    Hearing loss    Hypertension    Multiple sclerosis ([place])    Pancreatitis    Vision abnormalities      Social History   Socioeconomic History   Marital status: Married    Spouse name: Not on file   Number of children: Not on file   Years of education: Not on file   Highest education level: Not on file  Occupational History   Not on file  Tobacco Use   Smoking status: Never   Smokeless tobacco: Never   Tobacco comments:    second hand smoker when young. Heavy exposure.  Vaping Use   Vaping Use: Never used  Substance and Sexual Activity   Alcohol use: Yes    Comment: daily   Drug use: No   Sexual activity: Not Currently  Other Topics Concern   Not on file  Social History Narrative   Not on file   Social Determinants of Health   Financial Resource Strain: Not on file  Food Insecurity: Not on file  Transportation Needs: Not on file  Physical Activity: Not on file  Stress: Not on file  Social Connections: Not on file  Intimate Partner  Violence: Not on file    Past Surgical History:  Procedure Laterality Date   ANKLE FRACTURE SURGERY Bilateral    CARPAL TUNNEL RELEASE Bilateral    CHOLECYSTECTOMY     COLONOSCOPY  11/2016   multiple   ESOPHAGOGASTRODUODENOSCOPY  11/2016   ESOPHAGOGASTRODUODENOSCOPY N/A 02/01/2017   Procedure: ESOPHAGOGASTRODUODENOSCOPY (EGD);  Surgeon: Gatha Mayer, MD;  Location: [facility] ENDOSCOPY;  Service: Endoscopy;  Laterality: N/A;   ESOPHAGOGASTRODUODENOSCOPY (EGD) WITH PROPOFOL N/A 02/04/2017   Procedure: ESOPHAGOGASTRODUODENOSCOPY (EGD) WITH PROPOFOL;  Surgeon: Ladene Artist, MD;  Location: [facility] ENDOSCOPY;  Service: Endoscopy;  Laterality: N/A;   LAPAROSCOPIC GASTRIC SLEEVE RESECTION  2011   ULNAR NERVE REPAIR Bilateral     Family History  Problem Relation Age of Onset   Cancer Mother    Stroke Mother    Cancer Father     Allergies  Allergen Reactions   Ibuprofen Other (See Comments)    Cannot take due to gastric bypass   Nsaids Other (See Comments)    Gastric bypass    Current Outpatient Medications on File Prior to Visit  Medication Sig Dispense Refill   acetaminophen (TYLENOL) 500 MG tablet Take 500 mg by mouth daily  as needed (pain).     albuterol (PROVENTIL) (2.5 MG/3ML) 0.083% nebulizer solution Take 3 mLs (2.5 mg total) by nebulization every 6 (six) hours as needed for wheezing or shortness of breath. (Patient taking differently: Take 2.5 mg by nebulization as needed for wheezing or shortness of breath.) 150 mL 1   albuterol (VENTOLIN HFA) 108 (90 Base) MCG/ACT inhaler TAKE 2 PUFFS BY MOUTH EVERY 6 HOURS AS NEEDED FOR WHEEZE OR SHORTNESS OF BREATH (Patient taking differently: Inhale 1-2 puffs into the lungs 2 (two) times daily as needed for wheezing or shortness of breath.) 54 each 2   amitriptyline (ELAVIL) 10 MG tablet TAKE 1 TABLET BY MOUTH EVERYDAY AT BEDTIME (Patient taking differently: Take 10 mg by mouth at bedtime as needed for sleep.) 90 tablet 1   amLODipine (NORVASC) 5  MG tablet Take 1 tablet (5 mg total) by mouth daily. 90 tablet 1   Aspirin-Acetaminophen-Caffeine (EXCEDRIN PO) Take 1 tablet by mouth as needed (headache).     baclofen (LIORESAL) 10 MG tablet Take 1 tablet (10 mg total) by mouth 3 (three) times daily as needed for muscle spasms. 90 tablet 11   bismuth subsalicylate (PEPTO BISMOL) 262 MG/15ML suspension Take 30 mLs by mouth daily as needed (acid reflux).     budesonide-formoterol (SYMBICORT) 80-4.5 MCG/ACT inhaler TAKE 2 PUFFS BY MOUTH TWICE A DAY 10.2 each 3   Calcium Carbonate Antacid (TUMS PO) Take 2 tablets by mouth 2 (two) times daily as needed (acid reflux).     Cholecalciferol (D2000 ULTRA STRENGTH) 50 MCG (2000 UT) CAPS Take 2,000 Units by mouth daily.     diclofenac Sodium (VOLTAREN) 1 % GEL Apply 4 g topically 4 (four) times daily. 400 g 1   FLUoxetine (PROZAC) 40 MG capsule TAKE 1 CAPSULE BY MOUTH EVERY DAY (Patient taking differently: Take 40 mg by mouth daily.) 30 capsule 3   fluticasone (FLONASE) 50 MCG/ACT nasal spray SPRAY 2 SPRAYS INTO EACH NOSTRIL EVERY DAY (Patient taking differently: Place 2 sprays into both nostrils as needed for allergies.) 16 mL 12   gabapentin (NEURONTIN) 100 MG capsule TAKE 1 CAPSULE BY MOUTH THREE TIMES A DAY 90 capsule 1   meloxicam (MOBIC) 7.5 MG tablet Take 1 tablet (7.5 mg total) by mouth daily. 30 tablet 2   modafinil (PROVIGIL) 200 MG tablet Take 1 tablet (200 mg total) by mouth daily. 30 tablet 5   Multiple Vitamin (MULTIVITAMIN WITH MINERALS) TABS tablet Take 1 tablet by mouth daily.     mupirocin ointment (BACTROBAN) 2 % Apply 1 Application topically 2 (two) times daily. 22 g 2   OCREVUS 300 MG/10ML injection Inject 20 mLs (600 mg total) into the vein every 6 (six) months. 20 mL 1   omeprazole (PRILOSEC) 20 MG capsule Take 40 mg by mouth 2 (two) times daily before a meal.     ondansetron (ZOFRAN) 4 MG tablet Take 1 tablet (4 mg total) by mouth every 8 (eight) hours as needed for nausea or  vomiting. 20 tablet 0   oxybutynin (DITROPAN) 5 MG tablet Take 1 tablet (5 mg total) by mouth 2 (two) times daily. 60 tablet 11   RSV vaccine recomb adjuvanted (AREXVY) 120 MCG/0.5ML injection Inject into the muscle. 0.5 mL 0   venlafaxine XR (EFFEXOR-XR) 37.5 MG 24 hr capsule TAKE 1 CAPSULE BY MOUTH EVERY DAY 90 capsule 1   No current facility-administered medications on file prior to visit.    BP 138/80 Comment: checked twice espac  Pulse 78  Temp 98.2 F (36.8 C)   Resp 18   Ht 5' 3"$  (1.6 m)   Wt 170 lb (77.1 kg)   LMP 08/13/2014 Comment: very few periods  SpO2 99%   BMI 30.11 kg/m     Review of Systems  Constitutional:  Negative for chills, fatigue and fever.  Respiratory:  Negative for cough, chest tightness, shortness of breath and wheezing.   Cardiovascular:  Negative for chest pain and palpitations.  Gastrointestinal:  Negative for abdominal pain.  Genitourinary:  Negative for dysuria.  Musculoskeletal:  Positive for arthralgias. Negative for back pain and myalgias.       Pedal edema.  Lt knee pain.  Skin:  Positive for rash.  Neurological:  Negative for dizziness, speech difficulty and light-headedness.  Hematological:  Negative for adenopathy.  Psychiatric/Behavioral:  Negative for behavioral problems and dysphoric mood.        None reported.    Past Medical History:  Diagnosis Date   Alcohol abuse    Asthma    Esophagitis    GERD (gastroesophageal reflux disease)    Headache    Hearing loss    Hypertension    Multiple sclerosis ([place])    Pancreatitis    Vision abnormalities      Social History   Socioeconomic History   Marital status: Married    Spouse name: Not on file   Number of children: Not on file   Years of education: Not on file   Highest education level: Not on file  Occupational History   Not on file  Tobacco Use   Smoking status: Never   Smokeless tobacco: Never   Tobacco comments:    second hand smoker when young. Heavy  exposure.  Vaping Use   Vaping Use: Never used  Substance and Sexual Activity   Alcohol use: Yes    Comment: daily   Drug use: No   Sexual activity: Not Currently  Other Topics Concern   Not on file  Social History Narrative   Not on file   Social Determinants of Health   Financial Resource Strain: Not on file  Food Insecurity: Not on file  Transportation Needs: Not on file  Physical Activity: Not on file  Stress: Not on file  Social Connections: Not on file  Intimate Partner Violence: Not on file    Past Surgical History:  Procedure Laterality Date   ANKLE FRACTURE SURGERY Bilateral    CARPAL TUNNEL RELEASE Bilateral    CHOLECYSTECTOMY     COLONOSCOPY  11/2016   multiple   ESOPHAGOGASTRODUODENOSCOPY  11/2016   ESOPHAGOGASTRODUODENOSCOPY N/A 02/01/2017   Procedure: ESOPHAGOGASTRODUODENOSCOPY (EGD);  Surgeon: Gatha Mayer, MD;  Location: [facility] ENDOSCOPY;  Service: Endoscopy;  Laterality: N/A;   ESOPHAGOGASTRODUODENOSCOPY (EGD) WITH PROPOFOL N/A 02/04/2017   Procedure: ESOPHAGOGASTRODUODENOSCOPY (EGD) WITH PROPOFOL;  Surgeon: Ladene Artist, MD;  Location: [facility] ENDOSCOPY;  Service: Endoscopy;  Laterality: N/A;   LAPAROSCOPIC GASTRIC SLEEVE RESECTION  2011   ULNAR NERVE REPAIR Bilateral     Family History  Problem Relation Age of Onset   Cancer Mother    Stroke Mother    Cancer Father     Allergies  Allergen Reactions   Ibuprofen Other (See Comments)    Cannot take due to gastric bypass   Nsaids Other (See Comments)    Gastric bypass    Current Outpatient Medications on File Prior to Visit  Medication Sig Dispense Refill   acetaminophen (TYLENOL) 500 MG tablet Take 500 mg by mouth  daily as needed (pain).     albuterol (PROVENTIL) (2.5 MG/3ML) 0.083% nebulizer solution Take 3 mLs (2.5 mg total) by nebulization every 6 (six) hours as needed for wheezing or shortness of breath. (Patient taking differently: Take 2.5 mg by nebulization as needed for wheezing or  shortness of breath.) 150 mL 1   albuterol (VENTOLIN HFA) 108 (90 Base) MCG/ACT inhaler TAKE 2 PUFFS BY MOUTH EVERY 6 HOURS AS NEEDED FOR WHEEZE OR SHORTNESS OF BREATH (Patient taking differently: Inhale 1-2 puffs into the lungs 2 (two) times daily as needed for wheezing or shortness of breath.) 54 each 2   amitriptyline (ELAVIL) 10 MG tablet TAKE 1 TABLET BY MOUTH EVERYDAY AT BEDTIME (Patient taking differently: Take 10 mg by mouth at bedtime as needed for sleep.) 90 tablet 1   amLODipine (NORVASC) 5 MG tablet Take 1 tablet (5 mg total) by mouth daily. 90 tablet 1   Aspirin-Acetaminophen-Caffeine (EXCEDRIN PO) Take 1 tablet by mouth as needed (headache).     baclofen (LIORESAL) 10 MG tablet Take 1 tablet (10 mg total) by mouth 3 (three) times daily as needed for muscle spasms. 90 tablet 11   bismuth subsalicylate (PEPTO BISMOL) 262 MG/15ML suspension Take 30 mLs by mouth daily as needed (acid reflux).     budesonide-formoterol (SYMBICORT) 80-4.5 MCG/ACT inhaler TAKE 2 PUFFS BY MOUTH TWICE A DAY 10.2 each 3   Calcium Carbonate Antacid (TUMS PO) Take 2 tablets by mouth 2 (two) times daily as needed (acid reflux).     Cholecalciferol (D2000 ULTRA STRENGTH) 50 MCG (2000 UT) CAPS Take 2,000 Units by mouth daily.     diclofenac Sodium (VOLTAREN) 1 % GEL Apply 4 g topically 4 (four) times daily. 400 g 1   FLUoxetine (PROZAC) 40 MG capsule TAKE 1 CAPSULE BY MOUTH EVERY DAY (Patient taking differently: Take 40 mg by mouth daily.) 30 capsule 3   fluticasone (FLONASE) 50 MCG/ACT nasal spray SPRAY 2 SPRAYS INTO EACH NOSTRIL EVERY DAY (Patient taking differently: Place 2 sprays into both nostrils as needed for allergies.) 16 mL 12   gabapentin (NEURONTIN) 100 MG capsule TAKE 1 CAPSULE BY MOUTH THREE TIMES A DAY 90 capsule 1   meloxicam (MOBIC) 7.5 MG tablet Take 1 tablet (7.5 mg total) by mouth daily. 30 tablet 2   modafinil (PROVIGIL) 200 MG tablet Take 1 tablet (200 mg total) by mouth daily. 30 tablet 5    Multiple Vitamin (MULTIVITAMIN WITH MINERALS) TABS tablet Take 1 tablet by mouth daily.     mupirocin ointment (BACTROBAN) 2 % Apply 1 Application topically 2 (two) times daily. 22 g 2   OCREVUS 300 MG/10ML injection Inject 20 mLs (600 mg total) into the vein every 6 (six) months. 20 mL 1   omeprazole (PRILOSEC) 20 MG capsule Take 40 mg by mouth 2 (two) times daily before a meal.     ondansetron (ZOFRAN) 4 MG tablet Take 1 tablet (4 mg total) by mouth every 8 (eight) hours as needed for nausea or vomiting. 20 tablet 0   oxybutynin (DITROPAN) 5 MG tablet Take 1 tablet (5 mg total) by mouth 2 (two) times daily. 60 tablet 11   RSV vaccine recomb adjuvanted (AREXVY) 120 MCG/0.5ML injection Inject into the muscle. 0.5 mL 0   venlafaxine XR (EFFEXOR-XR) 37.5 MG 24 hr capsule TAKE 1 CAPSULE BY MOUTH EVERY DAY 90 capsule 1   No current facility-administered medications on file prior to visit.    BP 138/80 Comment: checked twice espac  Pulse  78   Temp 98.2 F (36.8 C)   Resp 18   Ht 5' 3"$  (1.6 m)   Wt 170 lb (77.1 kg)   LMP 08/13/2014 Comment: very few periods  SpO2 99%   BMI 30.11 kg/m        Objective:   Physical Exam  General Mental Status- Alert. General Appearance- Not in acute distress.   Skin General: Color- Normal Color. Moisture- Normal Moisture.  Neck Carotid Arteries- Normal color. Moisture- Normal Moisture. No carotid bruits. No JVD.  Chest and Lung Exam Auscultation: Breath Sounds:-Normal.  Cardiovascular Auscultation:Rythm- Regular. Murmurs & Other Heart Sounds:Auscultation of the heart reveals- No Murmurs.  Abdomen Inspection:-Inspeection Normal. Palpation/Percussion:Note:No mass. Palpation and Percussion of the abdomen reveal- Non Tender, Non Distended + BS, no rebound or guarding.  Neurologic Cranial Nerve exam:- CN III-XII intact(No nystagmus), symmetric smile. Strength:- 5/5 equal and symmetric strength both upper and lower extremities.   Lower ext-  1-2+ symmetric pedal edema. Negative homans sign. No redness of warmth.   Left knee- anterior aspect tender to palpation. Tender over lateral and medial menscus region.   Left groin area- slight pinkish rash with small shall broken down area 3 mm x 5 mm approxiamte. Culture over that area. No yellow dc. No white dc.     Assessment & Plan:   Patient Instructions  1. Pedal edema Will assess if chf. Recommend elevate legs and use compression socks. If any asymetric swelling or pain behind knee let me know and would get [place] lower ext. - Comp Met (CMET) - B Nat Peptide - DG Chest 2 View; Future  2. Wound infection. Likely fungal infection Nystatin cream follow cbc and wound cuture. If area worsens despite tx let us know. - Wound culture - CBC w/Diff   3. Essential hypertension Continue olmesartan  4. Breast cancer screening by mammogram - MM 3D SCREEN BREAST BILATERAL; Future  5. Acute pain of left knee - DG Knee Complete 4 Views Left; Future   Follow up in 10 days or sooner if needed   Mackie Pai, PA-C   Time spent with patient today was 40  minutes which consisted of chart review, discussing diagnoses, work up, treatment and documentation.

## 2022-10-11 NOTE — Patient Instructions (Signed)
1. Pedal edema Will assess if chf. Recommend elevate legs and use compression socks. If any asymetric swelling or pain behind knee let me know and would get [place] lower ext. - Comp Met (CMET) - B Nat Peptide - DG Chest 2 View; Future  2. Wound infection. Likely fungal infection Nystatin cream follow cbc and wound cuture. If area worsens despite tx let us know. - Wound culture - CBC w/Diff   3. Essential hypertension Continue olmesartan  4. Breast cancer screening by mammogram - MM 3D SCREEN BREAST BILATERAL; Future  5. Acute pain of left knee - DG Knee Complete 4 Views Left; Future   Follow up in 10 days or sooner if needed

## 2022-10-11 NOTE — Addendum Note (Signed)
Addended by: Anabel Halon on: 10/11/2022 05:41 PM   Modules accepted: Orders

## 2022-10-15 LAB — WOUND CULTURE
MICRO NUMBER:: 14600975
RESULT:: NO GROWTH
SPECIMEN QUALITY:: ADEQUATE

## 2022-10-16 ENCOUNTER — Inpatient Hospital Stay (HOSPITAL_BASED_OUTPATIENT_CLINIC_OR_DEPARTMENT_OTHER): Admission: RE | Admit: 2022-10-16 | Payer: Federal, State, Local not specified - PPO | Source: Ambulatory Visit

## 2022-10-18 ENCOUNTER — Telehealth (HOSPITAL_BASED_OUTPATIENT_CLINIC_OR_DEPARTMENT_OTHER): Payer: Self-pay

## 2022-10-22 ENCOUNTER — Ambulatory Visit: Payer: Federal, State, Local not specified - PPO | Admitting: Medical

## 2022-10-23 ENCOUNTER — Other Ambulatory Visit: Payer: Self-pay | Admitting: Medical

## 2022-10-23 ENCOUNTER — Other Ambulatory Visit: Payer: Self-pay | Admitting: Physical Medicine and Rehabilitation

## 2022-10-29 ENCOUNTER — Ambulatory Visit: Payer: Federal, State, Local not specified - PPO | Admitting: Medical

## 2022-11-01 ENCOUNTER — Encounter: Payer: Self-pay | Admitting: Neurology

## 2022-11-01 ENCOUNTER — Ambulatory Visit: Payer: Federal, State, Local not specified - PPO | Admitting: Neurology

## 2022-11-01 ENCOUNTER — Telehealth: Payer: Self-pay | Admitting: Neurology

## 2022-11-01 ENCOUNTER — Telehealth: Payer: Self-pay

## 2022-11-01 VITALS — BP 124/85 | HR 102 | Ht 63.0 in | Wt 168.6 lb

## 2022-11-01 DIAGNOSIS — R260 Ataxic gait: Secondary | ICD-10-CM | POA: Diagnosis not present

## 2022-11-01 DIAGNOSIS — G35 Multiple sclerosis: Secondary | ICD-10-CM

## 2022-11-01 DIAGNOSIS — Z79899 Other long term (current) drug therapy: Secondary | ICD-10-CM | POA: Diagnosis not present

## 2022-11-01 DIAGNOSIS — G8929 Other chronic pain: Secondary | ICD-10-CM

## 2022-11-01 DIAGNOSIS — F1011 Alcohol abuse, in remission: Secondary | ICD-10-CM

## 2022-11-01 MED ORDER — MODAFINIL 200 MG PO TABS: 200.0000 mg | ORAL_TABLET | Freq: Every day | ORAL | 5 refills | Status: DC

## 2022-11-01 NOTE — Telephone Encounter (Signed)
[employer] federal NPR sent to [facility] [phone] 

## 2022-11-01 NOTE — Telephone Encounter (Signed)
No show letter

## 2022-11-01 NOTE — Progress Notes (Signed)
[facility]  PATIENT: Catherine Hubbard DOB: 1961-08-15   _________________________________   HISTORICAL  CHIEF COMPLAINT:  Chief Complaint  Patient presents with   Follow-up    Pt in room 11,here for MS follow up. Pt states dong well. No concerns.    HISTORY OF PRESENT ILLNESS:  Catherine Hubbard is a 62 y.o. woman with relapsing remitting MS.    Update 11/01/2022: Sheis on Ocrevus and denies any new exacerbations. She is tolerating it well.   At last visit, CD20 count was 0 and IgG and IgM were normal.      She denies exacerbation though feels her gait is worse - she feels more related to knee pain than her MS.      She is walking without her cane and has no recent falls.  She can do stairs carefully and needs to hold the rail.  She has mild weakness in her legs.    She denies numbness but ha mild tingling at times in hands.   Vision is doing well.    She feels bladder function is unchanged with urinary urgency and no recent incontinence.  Marland Kitchen She will take oxybutynin of going out of the house for a while.    She reports being fatigued.  Modafinil helps some.  Mood has been stable.  She reports she is rarely drinking ETOH now.     Sleep varies.      MS History:   She was diagnosed with multiple sclerosis in 2009. Before that time, she had noted some gait ataxia but had not had any imaging studies. In 2009, due to progressive hearing loss, she had an MRI of the brain and  then had a lumbar puncture that also confirmed the diagnosis.  I have some of her early MRI reports there are several foci noted in the cervical and thoracic spinal cord in the brain, there were multiple hyperintense foci, mostly in the periventricular deep white matter many of these were contrast enhancing on 07/26/2008. There was another enhancing lesion adjacent to C2 in the spinal cord.  Initially, she was placed on Avonex. She did not feel good and she stopped. She then tried Copaxone but also  stopped after a while. She moved here in 2014 and saw Dr. Shelia Media at [facility] She was placed on Rebif. She tolerates Rebif well.        IMAGING: MRI from 12/04/2012  showed multiple foci in the spinal cord at C2, C3-C4, and C4-C5. There were also foci at T4, T7, T9 and T10. The MRI of the brain showed 2 nonenhancing foci not present from studies in 2011. She reports having an MRI at [facility] 2015 but we do not have those films.  MRI cervical spine 07/22/2021 shows foci at C2, C4 and C5, seen previously.   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   MRI brain 11/16/2018 showed MS lesion with no progression compared to 2018.     REVIEW OF SYSTEMS:  Constitutional: No fevers, chills, sweats, or change in appetite.    She reports fatigue and insomnia Eyes: see above.  No eye pain  But some light sensitivity. Ear, nose and throat: No hearing loss, ear pain, nasal congestion, sore throat.   tinnitus Cardiovascular: No chest pain, palpitations Respiratory:  No shortness of breath at rest or with exertion.   Some wheezes, once on asthma med's Gastrointestinal: No nausea, vomiting, diarrhea, abdominal pain, fecal incontinence.  She has esophagitis Genitourinary:  see above Musculoskeletal:  see above  She also notes neck pain Integumentary: No rash, pruritus, skin lesions Neurological: as above Psychiatric: as above Endocrine: No palpitations, diaphoresis, change in appetite, change in weigh or increased thirst.  Often feels cold Hematologic/Lymphatic:  No anemia, purpura, petechiae. Allergic/Immunologic: No itchy/runny eyes, nasal congestion, recent allergic reactions, rashes  ALLERGIES: Allergies  Allergen Reactions   Ibuprofen Other (See Comments)    Cannot take due to gastric bypass   Nsaids Other (See Comments)    Gastric bypass    HOME MEDICATIONS:  Current Outpatient Medications:    acetaminophen (TYLENOL) 500 MG tablet, Take 500 mg by mouth daily as needed  (pain)., Disp: , Rfl:    albuterol (PROVENTIL) (2.5 MG/3ML) 0.083% nebulizer solution, Take 3 mLs (2.5 mg total) by nebulization every 6 (six) hours as needed for wheezing or shortness of breath. (Patient taking differently: Take 2.5 mg by nebulization as needed for wheezing or shortness of breath.), Disp: 150 mL, Rfl: 1   albuterol (VENTOLIN HFA) 108 (90 Base) MCG/ACT inhaler, TAKE 2 PUFFS BY MOUTH EVERY 6 HOURS AS NEEDED FOR WHEEZE OR SHORTNESS OF BREATH, Disp: 54 g, Rfl: 1   amitriptyline (ELAVIL) 10 MG tablet, TAKE 1 TABLET BY MOUTH EVERYDAY AT BEDTIME, Disp: 30 tablet, Rfl: 5   Aspirin-Acetaminophen-Caffeine (EXCEDRIN PO), Take 1 tablet by mouth as needed (headache)., Disp: , Rfl:    baclofen (LIORESAL) 10 MG tablet, Take 1 tablet (10 mg total) by mouth 3 (three) times daily as needed for muscle spasms., Disp: 90 tablet, Rfl: 11   bismuth subsalicylate (PEPTO BISMOL) 262 MG/15ML suspension, Take 30 mLs by mouth daily as needed (acid reflux)., Disp: , Rfl:    budesonide-formoterol (SYMBICORT) 80-4.5 MCG/ACT inhaler, TAKE 2 PUFFS BY MOUTH TWICE A DAY, Disp: 10.2 each, Rfl: 3   Calcium Carbonate Antacid (TUMS PO), Take 2 tablets by mouth 2 (two) times daily as needed (acid reflux)., Disp: , Rfl:    Cholecalciferol (D2000 ULTRA STRENGTH) 50 MCG (2000 UT) CAPS, Take 2,000 Units by mouth daily., Disp: , Rfl:    diclofenac Sodium (VOLTAREN) 1 % GEL, Apply 4 g topically 4 (four) times daily., Disp: 400 g, Rfl: 1   fluticasone (FLONASE) 50 MCG/ACT nasal spray, SPRAY 2 SPRAYS INTO EACH NOSTRIL EVERY DAY, Disp: 48 mL, Rfl: 3   gabapentin (NEURONTIN) 100 MG capsule, TAKE 1 CAPSULE BY MOUTH THREE TIMES A DAY, Disp: 90 capsule, Rfl: 1   meloxicam (MOBIC) 7.5 MG tablet, Take 1 tablet (7.5 mg total) by mouth daily., Disp: 30 tablet, Rfl: 2   modafinil (PROVIGIL) 200 MG tablet, Take 1 tablet (200 mg total) by mouth daily., Disp: 30 tablet, Rfl: 5   Multiple Vitamin (MULTIVITAMIN WITH MINERALS) TABS tablet, Take 1  tablet by mouth daily., Disp: , Rfl:    mupirocin ointment (BACTROBAN) 2 %, Apply 1 Application topically 2 (two) times daily., Disp: 22 g, Rfl: 2   nystatin cream (MYCOSTATIN), Apply 1 Application topically 2 (two) times daily., Disp: 30 g, Rfl: 1   OCREVUS 300 MG/10ML injection, Inject 20 mLs (600 mg total) into the vein every 6 (six) months., Disp: 20 mL, Rfl: 1   omeprazole (PRILOSEC) 20 MG capsule, Take 40 mg by mouth 2 (two) times daily before a meal., Disp: , Rfl:    ondansetron (ZOFRAN) 4 MG tablet, Take 1 tablet (4 mg total) by mouth every 8 (eight) hours as needed for nausea or vomiting., Disp: 20 tablet, Rfl: 0   oxybutynin (DITROPAN) 5  MG tablet, Take 1 tablet (5 mg total) by mouth 2 (two) times daily., Disp: 60 tablet, Rfl: 11   RSV vaccine recomb adjuvanted (AREXVY) 120 MCG/0.5ML injection, Inject into the muscle., Disp: 0.5 mL, Rfl: 0   venlafaxine XR (EFFEXOR-XR) 37.5 MG 24 hr capsule, TAKE 1 CAPSULE BY MOUTH EVERY DAY, Disp: 90 capsule, Rfl: 1   amLODipine (NORVASC) 5 MG tablet, Take 1 tablet (5 mg total) by mouth daily. (Patient not taking: Reported on 11/01/2022), Disp: 90 tablet, Rfl: 1   FLUoxetine (PROZAC) 40 MG capsule, TAKE 1 CAPSULE BY MOUTH EVERY DAY (Patient not taking: Reported on 11/01/2022), Disp: 30 capsule, Rfl: 3  PAST MEDICAL HISTORY: Past Medical History:  Diagnosis Date   Alcohol abuse    Asthma    Esophagitis    GERD (gastroesophageal reflux disease)    Headache    Hearing loss    Hypertension    Multiple sclerosis ([place])    Pancreatitis    Vision abnormalities     PAST SURGICAL HISTORY: Past Surgical History:  Procedure Laterality Date   ANKLE FRACTURE SURGERY Bilateral    CARPAL TUNNEL RELEASE Bilateral    CHOLECYSTECTOMY     COLONOSCOPY  11/2016   multiple   ESOPHAGOGASTRODUODENOSCOPY  11/2016   ESOPHAGOGASTRODUODENOSCOPY N/A 02/01/2017   Procedure: ESOPHAGOGASTRODUODENOSCOPY (EGD);  Surgeon: Gatha Mayer, MD;  Location[facility] ENDOSCOPY;   Service: Endoscopy;  Laterality: N/A;   ESOPHAGOGASTRODUODENOSCOPY (EGD) WITH PROPOFOL N/A 02/04/2017   Procedure: ESOPHAGOGASTRODUODENOSCOPY (EGD) WITH PROPOFOL;  Surgeon: Ladene Artist, MD;  Location: [facility] ENDOSCOPY;  Service: Endoscopy;  Laterality: N/A;   LAPAROSCOPIC GASTRIC SLEEVE RESECTION  2011   ULNAR NERVE REPAIR Bilateral     FAMILY HISTORY: Family History  Problem Relation Age of Onset   Cancer Mother    Stroke Mother    Cancer Father     SOCIAL HISTORY:  Social History   Socioeconomic History   Marital status: Married    Spouse name: Not on file   Number of children: Not on file   Years of education: Not on file   Highest education level: Not on file  Occupational History   Not on file  Tobacco Use   Smoking status: Never   Smokeless tobacco: Never   Tobacco comments:    second hand smoker when young. Heavy exposure.  Vaping Use   Vaping Use: Never used  Substance and Sexual Activity   Alcohol use: Yes    Comment: daily   Drug use: No   Sexual activity: Not Currently  Other Topics Concern   Not on file  Social History Narrative   Not on file   Social Determinants of Health   Financial Resource Strain: Not on file  Food Insecurity: Not on file  Transportation Needs: Not on file  Physical Activity: Not on file  Stress: Not on file  Social Connections: Not on file  Intimate Partner Violence: Not on file     PHYSICAL EXAM  Vitals:   11/01/22 1053  BP: 124/85  Pulse: (!) 102  Weight: 168 lb 9.6 oz (76.5 kg)  Height: '5\' 3"'$  (1.6 m)    Body mass index is 29.87 kg/m.   General: The patient is well-developed and well-nourished and in no acute distress.     Neck/HEENT:   She has mild tenderness in the cervical paraspinal muscles.      Extremities:  Mild pedal edema   Neurologic Exam  Mental status: The patient is alert and oriented x 3  at the time of the examination. The patient has apparent normal recent and remote memory, with an  apparently normal attention span and concentration ability.   Speech is normal.  Cranial nerves: Extraocular movements are full.  . She reports reduced sensation in the right face.  Facial strength was normal.  The trapezius strength is normal.      Motor:  Muscle bulk is normal.   Tone is normal in arms and mildly increased in the legs..  Strength is 5/5 in the arms and legs.  Sensory: She has symmetric sensation to touch and vibration in the arms and proximal legs.  Coordination: Cerebellar testing shows good finger-nose-finger and right heel-to-shin but mildly reduced left heel-to-shin.  Gait and station: Station is normal.  The gait is wide and arthritic.   Cannot tandem.  Romberg is negative..  Reflexes: Deep tendon reflexes are symmetric and mildly increased in legs bilaterally.   No ankle clonus      ASSESSMENT AND PLAN  Relapsing remitting multiple sclerosis (HCC)  High risk medication use  Ataxic gait  History of alcohol abuse  Other chronic pain   1.    For MS, continue Ocrevus.  IgG/IgM and CBC with differential and CD20 were all good last visit.   Recheck labs next visit . 2.    Continue baclofen, Provigil and oxybutynon. 3.    MRI brain to determine if any progression and consider a different DMT if occurring 4.    She will return to see [place] in 6 months or sooner if she has new or worsening neurologic symptoms.    Calyn Rubi A. Felecia Shelling, MD, PhD, FAAN Certified in Neurology, Clinical Neurophysiology, Sleep Medicine, Pain Medicine and Neuroimaging Director, [place] at [facility] [address] [place], [place] [address] [phone]

## 2022-11-05 ENCOUNTER — Encounter: Payer: Self-pay | Admitting: Medical

## 2022-11-05 ENCOUNTER — Ambulatory Visit: Payer: Federal, State, Local not specified - PPO | Admitting: Medical

## 2022-11-05 VITALS — BP 130/75 | HR 82 | Resp 18 | Ht 63.0 in | Wt 169.2 lb

## 2022-11-05 DIAGNOSIS — M25562 Pain in left knee: Secondary | ICD-10-CM | POA: Diagnosis not present

## 2022-11-05 DIAGNOSIS — D649 Anemia, unspecified: Secondary | ICD-10-CM

## 2022-11-05 DIAGNOSIS — B49 Unspecified mycosis: Secondary | ICD-10-CM | POA: Diagnosis not present

## 2022-11-05 DIAGNOSIS — R5383 Other fatigue: Secondary | ICD-10-CM | POA: Diagnosis not present

## 2022-11-05 DIAGNOSIS — R7989 Other specified abnormal findings of blood chemistry: Secondary | ICD-10-CM

## 2022-11-05 DIAGNOSIS — K222 Esophageal obstruction: Secondary | ICD-10-CM

## 2022-11-05 DIAGNOSIS — I10 Essential (primary) hypertension: Secondary | ICD-10-CM

## 2022-11-05 DIAGNOSIS — G8929 Other chronic pain: Secondary | ICD-10-CM

## 2022-11-05 DIAGNOSIS — Z1211 Encounter for screening for malignant neoplasm of colon: Secondary | ICD-10-CM

## 2022-11-05 MED ORDER — OLMESARTAN MEDOXOMIL 5 MG PO TABS: ORAL_TABLET | ORAL | 3 refills | Status: DC

## 2022-11-05 MED ORDER — NYSTATIN 100000 UNIT/GM EX CREA: 1.0000 | TOPICAL_CREAM | Freq: Two times a day (BID) | CUTANEOUS | 2 refills | Status: DC

## 2022-11-05 NOTE — Progress Notes (Signed)
Subjective:    Patient ID: Catherine Hubbard, female    DOB: [birth year], 62 y.o.   MRN: [id number]  HPI Pt in for follow up.  Last visti AVS below in "   " 1. Pedal edema Will assess if chf. Recommend elevate legs and use compression socks. If any asymetric swelling or pain behind knee let me know and would get [place] lower ext. - Comp Met (CMET) - B Nat Peptide - DG Chest 2 View; Future   2. Wound infection. Likely fungal infection Nystatin cream follow cbc and wound cuture. If area worsens despite tx let us know. - Wound culture - CBC w/Diff     3. Essential hypertension Continue olmesartan   4. Breast cancer screening by mammogram - MM 3D SCREEN BREAST BILATERAL; Future   5. Acute pain of left knee - DG Knee Complete 4 Views Left; Future     "  She update me that her both rt and left unguinal rash will resolve with nystatin cream.   Htn- pt is mild elevated initially. But better on recheck.   Pedal  edema hx- last cxr did not show chf. Bmp was mild elevatd.  Left knee pain that persists. Pain has bee present since last visit. Can be severe at times. Had offered sport med referral last result note. Did not get all back.   Last xray showed.    IMPRESSION: 1. Small left knee effusion. No fracture or dislocation.  Hx of anemia. No black stools. Occasional bright red blood stools when she constipated. When strains will see bright red blood on paper. In patient I had placed colonoscopy in 2020 but did not get in with GI MD. She also has history of esophageal stenosis. Pt was seen by GI MD in [place]. Pt states recenlty feeling like having hard time swollowing.         Review of Systems  Constitutional:  Negative for chills, fatigue and fever.  HENT:  Negative for congestion, drooling and ear discharge.   Respiratory:  Negative for cough, chest tightness, shortness of breath and wheezing.   Cardiovascular:  Negative for chest pain and palpitations.   Gastrointestinal:  Negative for abdominal pain, blood in stool and nausea.  Genitourinary:  Negative for dysuria.  Musculoskeletal:  Negative for back pain.  Skin:  Negative for rash.  Neurological:  Negative for dizziness and light-headedness.  Hematological:  Negative for adenopathy. Does not bruise/bleed easily.  Psychiatric/Behavioral:  Negative for behavioral problems and decreased concentration.     Past Medical History:  Diagnosis Date   Alcohol abuse    Asthma    Esophagitis    GERD (gastroesophageal reflux disease)    Headache    Hearing loss    Hypertension    Multiple sclerosis ([place])    Pancreatitis    Vision abnormalities      Social History   Socioeconomic History   Marital status: Married    Spouse name: Not on file   Number of children: Not on file   Years of education: Not on file   Highest education level: Not on file  Occupational History   Not on file  Tobacco Use   Smoking status: Never   Smokeless tobacco: Never   Tobacco comments:    second hand smoker when young. Heavy exposure.  Vaping Use   Vaping Use: Never used  Substance and Sexual Activity   Alcohol use: Yes    Comment: daily   Drug use: No  Sexual activity: Not Currently  Other Topics Concern   Not on file  Social History Narrative   Not on file   Social Determinants of Health   Financial Resource Strain: Not on file  Food Insecurity: Not on file  Transportation Needs: Not on file  Physical Activity: Not on file  Stress: Not on file  Social Connections: Not on file  Intimate Partner Violence: Not on file    Past Surgical History:  Procedure Laterality Date   ANKLE FRACTURE SURGERY Bilateral    CARPAL TUNNEL RELEASE Bilateral    CHOLECYSTECTOMY     COLONOSCOPY  11/2016   multiple   ESOPHAGOGASTRODUODENOSCOPY  11/2016   ESOPHAGOGASTRODUODENOSCOPY N/A 02/01/2017   Procedure: ESOPHAGOGASTRODUODENOSCOPY (EGD);  Surgeon: Gatha Mayer, MD;  Location: [facility] ENDOSCOPY;   Service: Endoscopy;  Laterality: N/A;   ESOPHAGOGASTRODUODENOSCOPY (EGD) WITH PROPOFOL N/A 02/04/2017   Procedure: ESOPHAGOGASTRODUODENOSCOPY (EGD) WITH PROPOFOL;  Surgeon: Ladene Artist, MD;  Location: [facility] ENDOSCOPY;  Service: Endoscopy;  Laterality: N/A;   LAPAROSCOPIC GASTRIC SLEEVE RESECTION  2011   ULNAR NERVE REPAIR Bilateral     Family History  Problem Relation Age of Onset   Cancer Mother    Stroke Mother    Cancer Father     Allergies  Allergen Reactions   Ibuprofen Other (See Comments)    Cannot take due to gastric bypass   Nsaids Other (See Comments)    Gastric bypass    Current Outpatient Medications on File Prior to Visit  Medication Sig Dispense Refill   acetaminophen (TYLENOL) 500 MG tablet Take 500 mg by mouth daily as needed (pain).     albuterol (PROVENTIL) (2.5 MG/3ML) 0.083% nebulizer solution Take 3 mLs (2.5 mg total) by nebulization every 6 (six) hours as needed for wheezing or shortness of breath. (Patient taking differently: Take 2.5 mg by nebulization as needed for wheezing or shortness of breath.) 150 mL 1   albuterol (VENTOLIN HFA) 108 (90 Base) MCG/ACT inhaler TAKE 2 PUFFS BY MOUTH EVERY 6 HOURS AS NEEDED FOR WHEEZE OR SHORTNESS OF BREATH 54 g 1   amitriptyline (ELAVIL) 10 MG tablet TAKE 1 TABLET BY MOUTH EVERYDAY AT BEDTIME 30 tablet 5   Aspirin-Acetaminophen-Caffeine (EXCEDRIN PO) Take 1 tablet by mouth as needed (headache).     baclofen (LIORESAL) 10 MG tablet Take 1 tablet (10 mg total) by mouth 3 (three) times daily as needed for muscle spasms. 90 tablet 11   bismuth subsalicylate (PEPTO BISMOL) 262 MG/15ML suspension Take 30 mLs by mouth daily as needed (acid reflux).     budesonide-formoterol (SYMBICORT) 80-4.5 MCG/ACT inhaler TAKE 2 PUFFS BY MOUTH TWICE A DAY 10.2 each 3   Calcium Carbonate Antacid (TUMS PO) Take 2 tablets by mouth 2 (two) times daily as needed (acid reflux).     Cholecalciferol (D2000 ULTRA STRENGTH) 50 MCG (2000 UT) CAPS Take  2,000 Units by mouth daily.     diclofenac Sodium (VOLTAREN) 1 % GEL Apply 4 g topically 4 (four) times daily. 400 g 1   fluticasone (FLONASE) 50 MCG/ACT nasal spray SPRAY 2 SPRAYS INTO EACH NOSTRIL EVERY DAY 48 mL 3   gabapentin (NEURONTIN) 100 MG capsule TAKE 1 CAPSULE BY MOUTH THREE TIMES A DAY 90 capsule 1   meloxicam (MOBIC) 7.5 MG tablet Take 1 tablet (7.5 mg total) by mouth daily. 30 tablet 2   modafinil (PROVIGIL) 200 MG tablet Take 1 tablet (200 mg total) by mouth daily. 30 tablet 5   Multiple Vitamin (MULTIVITAMIN WITH MINERALS)  TABS tablet Take 1 tablet by mouth daily.     mupirocin ointment (BACTROBAN) 2 % Apply 1 Application topically 2 (two) times daily. 22 g 2   nystatin cream (MYCOSTATIN) Apply 1 Application topically 2 (two) times daily. 30 g 1   OCREVUS 300 MG/10ML injection Inject 20 mLs (600 mg total) into the vein every 6 (six) months. 20 mL 1   omeprazole (PRILOSEC) 20 MG capsule Take 40 mg by mouth 2 (two) times daily before a meal.     ondansetron (ZOFRAN) 4 MG tablet Take 1 tablet (4 mg total) by mouth every 8 (eight) hours as needed for nausea or vomiting. 20 tablet 0   oxybutynin (DITROPAN) 5 MG tablet Take 1 tablet (5 mg total) by mouth 2 (two) times daily. 60 tablet 11   RSV vaccine recomb adjuvanted (AREXVY) 120 MCG/0.5ML injection Inject into the muscle. 0.5 mL 0   venlafaxine XR (EFFEXOR-XR) 37.5 MG 24 hr capsule TAKE 1 CAPSULE BY MOUTH EVERY DAY 90 capsule 1   No current facility-administered medications on file prior to visit.    BP 130/75   Pulse 82   Resp 18   Ht 5\' 3"  (1.6 m)   Wt 169 lb 3.2 oz (76.7 kg)   LMP 08/13/2014 Comment: very few periods  SpO2 99%   BMI 29.97 kg/m        Objective:   Physical Exam  General Mental Status- Alert. General Appearance- Not in acute distress.   Skin General: Color- Normal Color. Moisture- Normal Moisture.  Neck Carotid Arteries- Normal color. Moisture- Normal Moisture. No carotid bruits. No  JVD.  Chest and Lung Exam Auscultation: Breath Sounds:-Normal.  Cardiovascular Auscultation:Rythm- Regular. Murmurs & Other Heart Sounds:Auscultation of the heart reveals- No Murmurs.  Abdomen Inspection:-Inspeection Normal. Palpation/Percussion:Note:No mass. Palpation and Percussion of the abdomen reveal- Non Tender, Non Distended + BS, no rebound or guarding.   Neurologic Cranial Nerve exam:- CN III-XII intact(No nystagmus), symmetric smile. Strength:- 5/5 equal and symmetric strength both upper and lower extremities.    Lower ext- lower extremity no pedal edema presently. Negative homans signs.     Assessment & Plan:   Patient Instructions  1. Hypertension, unspecified type Bp controlled. Contine olmesartan  2. Chronic pain of left knee Refer to sports med MD - Ambulatory referral to Sports Medicine  3. Fungal infection Continue nysatatin as needed.  4. Esophageal stricture Placed referral to GI. - Ambulatory referral to Gastroenterology  5. Anemia, unspecified type Get labs and follow. Advise if need to start iron. - CBC w/Diff - Iron, TIBC and Ferritin Panel - Fecal occult blood, imunochemical([facility]); Future  6. Elevated brain natriuretic peptide (BNP) level(on review recent pedal edema controlled. - B Nat Peptide - Comp Met (CMET)  7. Other fatigue  - B12  8. Encounter for screening colonoscopy - Ambulatory referral to Gastroenterology   [facility] number. Notified pt they will be calling tomorrow.  Follow up date to be determined after lab review.     Mackie Pai, PA-C   Time spent with patient today was  43 minutes which consisted of chart revdiew, discussing diagnosis, work up treatment and documentation.

## 2022-11-05 NOTE — Patient Instructions (Addendum)
1. Hypertension, unspecified type Bp controlled. Contine olmesartan  2. Chronic pain of left knee Refer to sports med MD - Ambulatory referral to Sports Medicine  3. Fungal infection Continue nysatatin as needed. Refilled today.  4. Esophageal stricture Placed referral to GI. - Ambulatory referral to Gastroenterology  5. Anemia, unspecified type Get labs and follow. Advise if need to start iron. - CBC w/Diff - Iron, TIBC and Ferritin Panel - Fecal occult blood, imunochemical([facility]); Future  6. Elevated brain natriuretic peptide (BNP) level(on review recent pedal edema controlled. - B Nat Peptide - Comp Met (CMET)  7. Other fatigue  - B12  8. Encounter for screening colonoscopy - Ambulatory referral to Gastroenterology   [facility] number. Notified pt they will be calling tomorrow.  Follow up date to be determined after lab review.

## 2022-11-06 ENCOUNTER — Telehealth: Payer: Self-pay | Admitting: Licensed Clinical Social Worker

## 2022-11-06 LAB — COMPREHENSIVE METABOLIC PANEL
ALT: 10 U/L (ref 0–35)
AST: 29 U/L (ref 0–37)
Albumin: 3.9 g/dL (ref 3.5–5.2)
Alkaline Phosphatase: 71 U/L (ref 39–117)
BUN: 11 mg/dL (ref 6–23)
CO2: 31 mEq/L (ref 19–32)
Calcium: 9.8 mg/dL (ref 8.4–10.5)
Chloride: 96 mEq/L (ref 96–112)
Creatinine, Ser: 0.68 mg/dL (ref 0.40–1.20)
GFR: 93.38 mL/min (ref >60.00)
Glucose, Bld: 91 mg/dL (ref 70–99)
Potassium: 4.4 mEq/L (ref 3.5–5.1)
Sodium: 138 mEq/L (ref 135–145)
Total Bilirubin: 0.7 mg/dL (ref 0.2–1.2)
Total Protein: 6.6 g/dL (ref 6.0–8.3)

## 2022-11-06 LAB — CBC WITH DIFFERENTIAL/PLATELET
Basophils Absolute: 0 10*3/uL (ref 0.0–0.1)
Basophils Relative: 0.9 % (ref 0.0–3.0)
Eosinophils Absolute: 0.4 10*3/uL (ref 0.0–0.7)
Eosinophils Relative: 7.6 % — ABNORMAL HIGH (ref 0.0–5.0)
HCT: 28.2 % — ABNORMAL LOW (ref 36.0–46.0)
Hemoglobin: 9.1 g/dL — ABNORMAL LOW (ref 12.0–15.0)
Lymphocytes Relative: 22.2 % (ref 12.0–46.0)
Lymphs Abs: 1.1 10*3/uL (ref 0.7–4.0)
MCHC: 32.1 g/dL (ref 30.0–36.0)
MCV: 79.5 fl (ref 78.0–100.0)
Monocytes Absolute: 0.8 10*3/uL (ref 0.1–1.0)
Monocytes Relative: 16.9 % — ABNORMAL HIGH (ref 3.0–12.0)
Neutro Abs: 2.6 10*3/uL (ref 1.4–7.7)
Neutrophils Relative %: 52.4 % (ref 43.0–77.0)
Platelets: 386 10*3/uL (ref 150.0–400.0)
RBC: 3.55 Mil/uL — ABNORMAL LOW (ref 3.87–5.11)
RDW: 17.1 % — ABNORMAL HIGH (ref 11.5–15.5)
WBC: 5 10*3/uL (ref 4.0–10.5)

## 2022-11-06 LAB — IRON,TIBC AND FERRITIN PANEL
%SAT: 3 % (calc) — ABNORMAL LOW (ref 16–45)
Ferritin: 5 ng/mL — ABNORMAL LOW (ref 16–288)
Iron: 15 ug/dL — ABNORMAL LOW (ref 45–160)
TIBC: 450 mcg/dL (calc) (ref 250–450)

## 2022-11-06 LAB — BRAIN NATRIURETIC PEPTIDE: Pro B Natriuretic peptide (BNP): 115 pg/mL — ABNORMAL HIGH (ref 0.0–100.0)

## 2022-11-06 LAB — VITAMIN B12: Vitamin B-12: 394 pg/mL (ref 211–911)

## 2022-11-06 NOTE — Patient Outreach (Signed)
  Care Coordination   11/06/2022 Name: Catherine Hubbard MRN: [id number] DOB: 13-Nov-1960   Care Coordination Outreach Attempts:  An unsuccessful telephone outreach was attempted today to offer the patient information about available care coordination services as a benefit of their health plan.   Follow Up Plan:  Additional outreach attempts will be made to offer the patient care coordination information and services.   Encounter Outcome:  No Answer   Care Coordination Interventions:  No, not indicated    Casimer Lanius, [place] [phone]

## 2022-11-07 ENCOUNTER — Ambulatory Visit: Payer: Federal, State, Local not specified - PPO | Admitting: Family Medicine

## 2022-11-09 MED ORDER — IRON (FERROUS SULFATE) 325 (65 FE) MG PO TABS: ORAL_TABLET | ORAL | 3 refills | Status: DC

## 2022-11-09 NOTE — Addendum Note (Signed)
Addended by: Anabel Halon on: 11/09/2022 12:52 PM   Modules accepted: Orders

## 2022-11-12 ENCOUNTER — Telehealth: Payer: Self-pay | Admitting: Licensed Clinical Social Worker

## 2022-11-12 DIAGNOSIS — G35 Multiple sclerosis: Secondary | ICD-10-CM | POA: Diagnosis not present

## 2022-11-12 NOTE — Patient Outreach (Signed)
  Care Coordination   11/12/2022 Name: Catherine Hubbard MRN: [id number] DOB: September 30, 1960   Care Coordination Outreach Attempts:  A second unsuccessful outreach was attempted today to offer the patient with information about available care coordination services as a benefit of their health plan.     Follow Up Plan:  Additional outreach attempts will be made to offer the patient care coordination information and services.   Encounter Outcome:  No Answer   Care Coordination Interventions:  No, not indicated    Casimer Lanius, [place] [phone]

## 2022-11-13 ENCOUNTER — Other Ambulatory Visit: Payer: Self-pay | Admitting: Medical

## 2022-11-21 ENCOUNTER — Telehealth: Payer: Self-pay | Admitting: Licensed Clinical Social Worker

## 2022-11-21 ENCOUNTER — Ambulatory Visit: Payer: Federal, State, Local not specified - PPO | Admitting: Family Medicine

## 2022-11-21 NOTE — Patient Outreach (Signed)
  Care Coordination   11/21/2022 Name: MANVITHA BRAMAN MRN: [id number] DOB: Feb 11, 1961   Care Coordination Outreach Attempts:  A third unsuccessful outreach was attempted today to offer the patient with information about available care coordination services as a benefit of their health plan.   Follow Up Plan:  No further outreach attempts will be made at this time. We have been unable to contact the patient to offer or enroll patient in care coordination services  Encounter Outcome:  No Answer   Care Coordination Interventions:  No, not indicated    Casimer Lanius, [place] [phone]

## 2022-11-22 ENCOUNTER — Ambulatory Visit: Payer: Federal, State, Local not specified - PPO | Admitting: Family Medicine

## 2022-11-27 ENCOUNTER — Encounter
Payer: Federal, State, Local not specified - PPO | Attending: Physical Medicine and Rehabilitation | Admitting: Physical Medicine and Rehabilitation

## 2022-11-27 VITALS — BP 144/96 | HR 81 | Ht 63.0 in | Wt 176.0 lb

## 2022-11-27 DIAGNOSIS — M5481 Occipital neuralgia: Secondary | ICD-10-CM

## 2022-11-27 MED ORDER — BUPIVACAINE HCL (PF) 0.5 % IJ SOLN
4.0000 mL | Freq: Once | INTRAMUSCULAR | Status: AC
Start: 2022-11-27 — End: 2022-11-27
  Administered 2022-11-27: 4 mL

## 2022-11-27 NOTE — Patient Instructions (Signed)

## 2022-11-27 NOTE — Progress Notes (Signed)
Occipital nerve block procedure note: bilateral greater and lesser occipital nerves  Written consent was obtained for the patient. Location for greater and lesser occipital nerves was determined using bony landmarks and points of maximal sensitivity.  The areas were cleaned with alcohol, vapocoolant spray applied, needle drawback performed, and a total of 4 cc of 0.5% Marcaine was injected to both these sites.  There were no complications from the procedure, and it was well tolerated.   

## 2022-11-27 NOTE — Progress Notes (Signed)
Subjective:    Patient ID: Catherine Hubbard, female    DOB: 11/23/1960, 62 y.o.   MRN: [id number]  HPI  [name] presents for follow-up of lumbar radicular pain and cervical spine pain.  1) Cervical myofascial pain syndrome: -the trigger point injections helped previously -She continues to have tension and would like to repeat the trigger point injections next visit.   2) Impaired mobility and ADLs -She has difficulty lifting weights and this makes it hard for her to find a job.   3) Cervical spinal stenosis: She currently has pain radiating from her neck down to her ankles.  -The pain is constant, irritating, burning stabbing. -The Meloxicam is helping- she takes when pain is getting worse.  -having pain radiating down her left arm -very bothersome to her -worse when she leans forward to watch TV and when she leans neck backward Radiates down to the lateral aspect of her arm.  -somewhat interested in cervical traction device -has seen PT before  4) Alcohol abuse: -Has led to falls in the past.  -she is doing better.   5) spinal compression fracture: -fell off her bed and suffered a spinal compression fracture -she is wearing a spinal orthosis -she was prescribed oxycodone and used this once yesterday -she takes vitamin D every day.   6) Depression -fluctuates in severity -still taking effexor and Prozac.  -has had recent deaths in her family.      Prior history: She threw things in her home last night due to her pain.   She has pain radiating into her legs, as well as swelling.   Her frustration level has been very high recently.   She would be interested in a repeat ESI and her pain is now present in the left lower extremity as well.   Prior history:  Lower back pain:  08/25/19: Her lower back pain is worst with extension and is present in her bilateral lower back. Since last visit she has been using a lumbar support brace which does provide some  relief. She has been doing her HEP every day. She has had steroid injections in the past that did provide her with relief. She has pain in bilateral lower extremities as well in the anterior and lateral distributions. This pain is worse with movement-walking, standing, and when she lays flat.   10/06/19: On 1/21 she had a right sided L4-L5 epidural steroid injection with Dr. Wynn Banker. Her back pain is now much improved.    Bilateral hip pain: She has had some bilateral hip pain. She has been doing exercises at home for this. But she is now able to walk a lot more. She sometimes goes to the grocery store for 2 hours at a time to do her shopping as well as get exercise in walking. She still has difficulty with stairs.    Poor energy:  2/16: Her energy is now much improved and she has stopped taking the Ritalin.    Depression:  1/05: She say a psychiatrist in the past and was prescribed Prozac, Effexor, and Cymbalta which she found to be helpful for her depression. Last visit we restarted her on Prozac and this did provide some relief. She has not been following with the psychiatrist any more and does not want to at this time.    2/16: Her mood is much improved with these medications and she is not experiencing any adverse side effects. Her friends tell her she is like a new person and  she has excellent energy. 1 month ago she says she felt she could not return to work but now she feels that she may be able to.    Left CMC joint pain: She notes increased swelling in this joint compared to the opposite side and it can sometimes pain her a lot. She has a listed allergy to NSAIDs but said this was gastritis from ibuprofen more than 10 years ago. She has since taken ibuprofen without issues. Has never had any imaging of her hand or splint or steroid injection.   6/9: She was dong really well except for a month ago, when her back pain and lowe leg cramping returned. She feels soreness in both legs as well  as in her back. She would be interested in repeat steroid injection. She feels like her fatigue has worsened and she is sleeping 16 hours per day. She does not have interest in anything. She is most bored in the afternoons. Her husband tries to watch TV with her in the afternoons to stimulate her. She used to like to talk to her neighbors but she has had some issues of her own. Her roommate is a godfearing woman and she does not want to be preached to from her.    Pain Inventory Average Pain 3  Pain Right Now 8 My pain is intermittent, sharp, burning, and aching  In the last 24 hours, has pain interfered with the following? General activity 3 Relation with others 3 Enjoyment of life 3 What TIME of day is your pain at its worst? morning,daytime, evening, night, anytime Sleep (in general) Fair  Pain is worse with: sitting Pain improves with: heat/ice and TENS Relief from Meds: 2         Family History  Problem Relation Age of Onset   Cancer Mother    Stroke Mother    Cancer Father    Social History   Socioeconomic History   Marital status: Married    Spouse name: Not on file   Number of children: Not on file   Years of education: Not on file   Highest education level: Not on file  Occupational History   Not on file  Tobacco Use   Smoking status: Never   Smokeless tobacco: Never   Tobacco comments:    second hand smoker when young. Heavy exposure.  Vaping Use   Vaping Use: Never used  Substance and Sexual Activity   Alcohol use: Yes    Comment: daily   Drug use: No   Sexual activity: Not Currently  Other Topics Concern   Not on file  Social History Narrative   Not on file   Social Determinants of Health   Financial Resource Strain: Not on file  Food Insecurity: Not on file  Transportation Needs: Not on file  Physical Activity: Not on file  Stress: Not on file  Social Connections: Not on file   Past Surgical History:  Procedure Laterality Date    ANKLE FRACTURE SURGERY Bilateral    CARPAL TUNNEL RELEASE Bilateral    CHOLECYSTECTOMY     COLONOSCOPY  11/2016   multiple   ESOPHAGOGASTRODUODENOSCOPY  11/2016   ESOPHAGOGASTRODUODENOSCOPY N/A 02/01/2017   Procedure: ESOPHAGOGASTRODUODENOSCOPY (EGD);  Surgeon: Iva BoopGessner, Carl E, MD;  [facility];  Service: Endoscopy;  Laterality: N/A;   ESOPHAGOGASTRODUODENOSCOPY (EGD) WITH PROPOFOL N/A 02/04/2017   Procedure: ESOPHAGOGASTRODUODENOSCOPY (EGD) WITH PROPOFOL;  Surgeon: Meryl DareStark, Malcolm T, MD;  [facility];  Service: Endoscopy;  Laterality: N/A;  LAPAROSCOPIC GASTRIC SLEEVE RESECTION  2011   ULNAR NERVE REPAIR Bilateral    Past Medical History:  Diagnosis Date   Alcohol abuse    Asthma    Esophagitis    GERD (gastroesophageal reflux disease)    Headache    Hearing loss    Hypertension    Multiple sclerosis (HCC)    Pancreatitis    Vision abnormalities    LMP 08/13/2014 Comment: very few periods  Opioid Risk Score:   Fall Risk Score:  `1  Depression screen [place] 2/9     09/21/2022   10:53 AM 08/01/2022   10:38 AM 04/27/2022   11:21 AM 11/06/2021    9:07 AM 06/06/2021    9:16 AM 02/24/2021    3:04 PM 08/24/2020    9:50 AM  Depression screen [facility] 2/9  Decreased Interest 0 0 0 0 1 1 1   Down, Depressed, Hopeless 0 0 0 0 1 1 1   PHQ - 2 Score 0 0 0 0 2 2 2     Review of Systems  Constitutional: Negative.   Eyes: Negative.   Respiratory: Negative.    Endocrine: Negative.   Genitourinary: Negative.   Musculoskeletal:  Positive for neck pain.       Left arm  Allergic/Immunologic: Negative.       Objective:   Physical Exam Gen: no distress, normal appearing HEENT: oral mucosa pink and moist, NCAT Cardio: Reg rate Chest: normal effort, normal rate of breathing Abd: soft, non-distended Ext: no edema Psych: pleasant, normal affect Ext: bilateral lower extremity edema Skin: bruising left arm Neuro/Musculoskeletal: Able to walk in office without falls with RW.   Remains sitting most of this session in contrast to last session. Able to bend down and touch her toes with FROM. Diffuse tenderness along spine with gentle palpation. Extension and oblique extension reproduce pain in lumbar spine. 5/5 strength in bilateral arms. 4-/5 in bilateral legs due to diffuse weakness. No focal motor or sensory deficits. + Slump test in bilateral lower extremities.   Has increased swelling at left [facility] joint with increased tenderness to palpation compared to opposite side.  Range of motion of neck limited to 40 degrees bilateral, tingling in neck reproduced with neck flexion.  Psych: pleasant, normal affect, attention is significantly improved from last visit.        Assessment & Plan:  KELAHNI VATTER is a 62 y.o. female with history of alcohol abuse, multiple sclerosis and frequent falls, presenting for follow-up for her diffuse chronic pain and depression., cervical and lumbar spine pain, compression fracture.    1) Poor energy: The Ritalin has really helped with her attention and energy and has helped her to be more active. Her energy is tremendously improved and she no longer requires the Ritalin which she has stopped taking on her own.    2) Depression: Continue Prozac and Effexor for depression.She is experiencing worsening anhedonia and excessive sleep. Increase Prozac to 40mg .   -Has become more severe. Add Amitriptyline 10mg  HS.    Keep a daily log of goals and check them off every day.   Maintain social connections in your life. Continue exercising with your neighbors.    Walk at least 15 minutes every day, preferably outside.    Look for local organizations and programs that can add meaning and friendship to your life.    Make an effort to do activities that you enjoy, such as going to the [employer], shopping, [employer] (cost is an issue here),  consider return to work in a sedentary job.      3) L4-L5 lumbar stenosis with neurogenic claudication  left>right.  -I have personally reviewed Lumbar MRI which shows mild-moderate stenosis. On 1/21 she had a right sided L4-L5 epidural steroid injection with Dr. Wynn Banker with great improvement in symptoms.  Will refer for repeat injection.  -Discussed Meloxicam as an option to consider in the future.  -Discussed current symptoms of pain and history of pain.  -Discussed benefits of exercise in reducing pain. -Discussed following foods that may reduce pain: 1) Ginger (especially studied for arthritis)- reduce leukotriene production to decrease inflammation 2) Blueberries- high in phytonutrients that decrease inflammation 3) Salmon- marine omega-3s reduce joint swelling and pain 4) Pumpkin seeds- reduce inflammation 5) dark chocolate- reduces inflammation 6) turmeric- reduces inflammation 7) tart cherries - reduce pain and stiffness 8) extra virgin olive oil - its compound olecanthal helps to block prostaglandins  9) chili Hubbard- can be eaten or applied topically via capsaicin 10) mint- helpful for headache, muscle aches, joint pain, and itching 11) garlic- reduces inflammation  Link to further information on diet for chronic pain: [email or web address].[email or web address]    4) Bilateral greater trochanteric pain syndrome: Continue HEP. Educated regarding etiology and treatment of condition.   5) Impaired mobility and ADLs secondary to Multiple Sclerosis, Major Depression Disorder, L4-L5 lumbar stenosis: Patient's overall mobility and mental health are have regressed a little since last visit. She has been trying to obtain disability for financial support and I provided her with the address and phone number of the social security office and advised her that I am happy to help fill out any paperwork she needs. That being said, I advised her that returning to work on a part-time basis may be very good for her, improving her quality of life,  physical function, giving her purpose, and improving her financial situation. She is agreeable to this and feels that she may be able to return to work and will look for jobs that interest her and that will not be too strenuous for her (avoiding lifting heavy weights or climbing stairs). She is still nervous about falling at work. -Encouraged to continue looking for work.    6) Left CMC arthritis: Prescribed diclofenac gel and advised that patient may apply up to 4 times per day. If this does not provide relief asked that patient call our office and I can order an XR and provide a referral for fitting for a splint. If this does not provide relief, we can consider corticosteroid injection.   7) Alcohol abuse: She drinks at least 3 alcoholic beverages per day. She would like to quit. Discussed some of the risks of alcohol use. Made goal to stop alcohol use given compression fracture. Recommended replacing third drink with Ro[place] tea.  -Discussed option of support groups or disulfiram.  -prescribed disulfiram  8) Falls: related to alcohol use. If we can get this under control, the falls will decrease.   9) Disrupted circadian rhythm: Sometimes sleeps at 5am and sleeps during the day. Discussed sleep aides.   10) Cervical myofascial pain syndrome: -Repeat trigger point injections next visit.  11) Cervical spinal stenosis: -discussed physical therapy -continue sit and be fit -continue Meloxicam, taking as needed.  -Keep up the turmeric.  -cervical MRI ordered.  -Discussed current symptoms of pain and history of pain.  -Discussed following foods that may reduce pain: 1) Ginger (especially studied for arthritis)- reduce leukotriene production to decrease inflammation 2) Blueberries- high in phytonutrients  that decrease inflammation 3) Salmon- marine omega-3s reduce joint swelling and pain 4) Pumpkin seeds- reduce inflammation 5) dark chocolate- reduces inflammation 6) turmeric- reduces  inflammation 7) tart cherries - reduce pain and stiffness 8) extra virgin olive oil - its compound olecanthal helps to block prostaglandins  9) chili Hubbard- can be eaten or applied topically via capsaicin 10) mint- helpful for headache, muscle aches, joint pain, and itching 11) garlic- reduces inflammation  Link to further information on diet for chronic pain: [email or web address].[email or web address]   Link to further information on diet for chronic pain: [email or web address].[email or web address]     All questions were encouraged and answered. Follow up with me in mid-April.   12) Compression fracture: -a little but of goal cheese and green leafy vegetables every day.  -daily 10 minute walk outside.  -can continue oxycodone for very severe pain as prescribed by NSGY. If pain is moderate can consider restarting meloxicam -discussed risks of femur fracture -discussed natural healing process  13) Headaches: -waking up every day with headaches .Foods to alleviate migraine:  1) dark leafy greens 2) avocado 3) tuna 4) samon and mackerel 5) beans and legumes Supplements that can be helpful: feverfew, B12, and magnesium  Foods to avoid in migraine: 1) Excessive (or irregular timing) coffee 2) red wine 3) aged cheeses 4) chocolate 5) citrus fruits 6) aspartame and other artifical sweeteners 7) yeast 8) MSG (in processed foods) 9) processed and cured meats 10) nuts and certain seeds 11) chicken livers and other organ meats 12) dairy products like buttermilk, sour cream, and yogurt 13) dried fruits like dates, figs, and raisins 14) garlic 15) onions 16) potato chips 17) pickled foods like olives and sauerkraut 18) some fresh fruits like ripe banana, papaya, red plums, raspberries, kiwi, pineapple 19) tomato-based products  Recommend to keep a migraine diary: rate daily the  severity of your headache (1-10) and what foods you eat that day to help determine patterns.    -bilateral occipital nerve blocks next visit.  -refilled amitripytline

## 2022-11-29 ENCOUNTER — Ambulatory Visit: Payer: Federal, State, Local not specified - PPO | Admitting: Family Medicine

## 2022-11-29 ENCOUNTER — Other Ambulatory Visit: Payer: Self-pay

## 2022-11-29 ENCOUNTER — Encounter: Payer: Self-pay | Admitting: Family Medicine

## 2022-11-29 VITALS — BP 130/90 | Ht 63.0 in | Wt 176.0 lb

## 2022-11-29 DIAGNOSIS — E2839 Other primary ovarian failure: Secondary | ICD-10-CM | POA: Insufficient documentation

## 2022-11-29 DIAGNOSIS — D509 Iron deficiency anemia, unspecified: Secondary | ICD-10-CM

## 2022-11-29 DIAGNOSIS — M170 Bilateral primary osteoarthritis of knee: Secondary | ICD-10-CM

## 2022-11-29 DIAGNOSIS — M25872 Other specified joint disorders, left ankle and foot: Secondary | ICD-10-CM | POA: Diagnosis not present

## 2022-11-29 DIAGNOSIS — T148XXA Other injury of unspecified body region, initial encounter: Secondary | ICD-10-CM | POA: Diagnosis not present

## 2022-11-29 DIAGNOSIS — S32010A Wedge compression fracture of first lumbar vertebra, initial encounter for closed fracture: Secondary | ICD-10-CM

## 2022-11-29 DIAGNOSIS — M25562 Pain in left knee: Secondary | ICD-10-CM

## 2022-11-29 DIAGNOSIS — M19171 Post-traumatic osteoarthritis, right ankle and foot: Secondary | ICD-10-CM

## 2022-11-29 NOTE — Assessment & Plan Note (Signed)
Acutely occurring.  She did not fall on the right side which likely exacerbated the pain on the left.  She has more of an effusion on the left side. -Counseled on home exercise therapy and supportive care. -Right knee x-ray. -Can consider injection or physical therapy

## 2022-11-29 NOTE — Assessment & Plan Note (Signed)
Acute on chronic in nature.  She reports having surgery from a dislocation of the left ankle. -Counseled on home exercise therapy and supportive care. -X-ray. -Could consider injection, physical therapy, orthotics

## 2022-11-29 NOTE — Assessment & Plan Note (Signed)
Acutely occurring in the soft tissue overlying the right knee that occurred after a fall 2 weeks ago.  Is likely complicating her lower extremity swelling on the right side. -Counseled on supportive care. -Can consider aspiration

## 2022-11-29 NOTE — Patient Instructions (Signed)
Nice to meet you Please try heat on the hematoma and ice on the knee  We'll call with the results from today  Please schedule a bone density  We have made a referral to the hematologist   Please send me a message in MyChart with any questions or updates.  Please see me back on Monday for aspiration and injection.   --Dr. Jordan Likes

## 2022-11-29 NOTE — Progress Notes (Signed)
Catherine Hubbard - 62 y.o. female MRN [id number]  Date of birth: 09/27/1960  SUBJECTIVE:  Including CC & ROS.  No chief complaint on file.   Catherine Hubbard is a 61 y.o. female that is presenting with bilateral knee pain with lower extremity swelling and ankle pain.  She had a fall on her right knee about 2 weeks ago.  She also reports significant swelling in her ankles.  She also has left knee pain.  She reports a history of ankle fractures with surgery.    Review of Systems See HPI   HISTORY: Past Medical, Surgical, Social, and Family History Reviewed & Updated per EMR.   Pertinent Historical Findings include:  Past Medical History:  Diagnosis Date   Alcohol abuse    Asthma    Esophagitis    GERD (gastroesophageal reflux disease)    Headache    Hearing loss    Hypertension    Multiple sclerosis    Pancreatitis    Vision abnormalities     Past Surgical History:  Procedure Laterality Date   ANKLE FRACTURE SURGERY Bilateral    CARPAL TUNNEL RELEASE Bilateral    CHOLECYSTECTOMY     COLONOSCOPY  11/2016   multiple   ESOPHAGOGASTRODUODENOSCOPY  11/2016   ESOPHAGOGASTRODUODENOSCOPY N/A 02/01/2017   Procedure: ESOPHAGOGASTRODUODENOSCOPY (EGD);  Surgeon: Iva Boop, MD;  Location: [facility] ENDOSCOPY;  Service: Endoscopy;  Laterality: N/A;   ESOPHAGOGASTRODUODENOSCOPY (EGD) WITH PROPOFOL N/A 02/04/2017   Procedure: ESOPHAGOGASTRODUODENOSCOPY (EGD) WITH PROPOFOL;  Surgeon: Meryl Dare, MD;  Location: [facility] ENDOSCOPY;  Service: Endoscopy;  Laterality: N/A;   LAPAROSCOPIC GASTRIC SLEEVE RESECTION  2011   ULNAR NERVE REPAIR Bilateral      PHYSICAL EXAM:  VS: BP (!) 130/90 (BP Location: Left Arm, Patient Position: Sitting)   Ht 5\' 3"  (1.6 m)   Wt 176 lb (79.8 kg)   LMP 08/13/2014 Comment: very few periods  BMI 31.18 kg/m  Physical Exam Gen: NAD, alert, cooperative with exam, well-appearing MSK:  Neurovascularly intact    Limited ultrasound: Right and left  knee pain:  Left knee: Mild effusion the suprapatellar pouch. Normal-appearing quadricep and patellar tendon. Degenerative changes appreciated of the medial and lateral meniscus.  Right knee: Trace effusion. Normal-appearing quadricep tendon. Fairly large hematoma appreciated in the subcutaneous that measures 7.6 cm in the longitudinal plane.   Summary: Findings consistent with degenerative changes of the knee with a soft tissue hematoma on the right  Ultrasound and interpretation by Clare Gandy, MD    ASSESSMENT & PLAN:   Compression fracture of L1 lumbar vertebra Acute on chronic in nature.  Independent review of the CT lumbar spine from 12/05/2021 shows a chronic compression of the L1 vertebra.  She reports this occurred when she just fell from a standing height. -Counseled on home exercise therapy and supportive care. -DEXA scan. -Can consider additional lab testing such as a vitamin D, PTh, etc  Post-traumatic osteoarthritis of right ankle Acute on chronic in nature.  Has a history of fracture with surgery.  Does have swelling significant around the ankles. -On home exercise therapy and supportive care. -X-ray. -Could consider injection or orthotics.  Primary osteoarthritis of both knees Acutely occurring.  She did not fall on the right side which likely exacerbated the pain on the left.  She has more of an effusion on the left side. -Counseled on home exercise therapy and supportive care. -Right knee x-ray. -Can consider injection or physical therapy  Estrogen deficiency Check bone density.  Hematoma Acutely occurring in the soft tissue overlying the right knee that occurred after a fall 2 weeks ago.  Is likely complicating her lower extremity swelling on the right side. -Counseled on supportive care. -Can consider aspiration  Impingement syndrome of left ankle Acute on chronic in nature.  She reports having surgery from a dislocation of the left  ankle. -Counseled on home exercise therapy and supportive care. -X-ray. -Could consider injection, physical therapy, orthotics   Iron deficiency anemia Acute on chronic in nature.  This is been failing over several years. -Counseled on supportive care. - Referral to otology for consideration of IV iron infusion

## 2022-11-29 NOTE — Assessment & Plan Note (Signed)
Acute on chronic in nature.  Independent review of the CT lumbar spine from 12/05/2021 shows a chronic compression of the L1 vertebra.  She reports this occurred when she just fell from a standing height. -Counseled on home exercise therapy and supportive care. -DEXA scan. -Can consider additional lab testing such as a vitamin D, PTh, etc

## 2022-11-29 NOTE — Assessment & Plan Note (Signed)
Check bone density. 

## 2022-11-29 NOTE — Assessment & Plan Note (Signed)
Acute on chronic in nature.  Has a history of fracture with surgery.  Does have swelling significant around the ankles. -On home exercise therapy and supportive care. -X-ray. -Could consider injection or orthotics.

## 2022-11-29 NOTE — Assessment & Plan Note (Signed)
Acute on chronic in nature.  This is been failing over several years. -Counseled on supportive care. - Referral to otology for consideration of IV iron infusion

## 2022-11-30 ENCOUNTER — Ambulatory Visit (HOSPITAL_BASED_OUTPATIENT_CLINIC_OR_DEPARTMENT_OTHER)
Admission: RE | Admit: 2022-11-30 | Discharge: 2022-11-30 | Disposition: A | Discharge status: Home / Self Care | Payer: Federal, State, Local not specified - PPO | Source: Ambulatory Visit | Attending: Family Medicine | Admitting: Family Medicine

## 2022-11-30 DIAGNOSIS — M19171 Post-traumatic osteoarthritis, right ankle and foot: Secondary | ICD-10-CM | POA: Diagnosis not present

## 2022-11-30 DIAGNOSIS — M1711 Unilateral primary osteoarthritis, right knee: Secondary | ICD-10-CM | POA: Diagnosis not present

## 2022-11-30 DIAGNOSIS — M25872 Other specified joint disorders, left ankle and foot: Secondary | ICD-10-CM

## 2022-11-30 DIAGNOSIS — M170 Bilateral primary osteoarthritis of knee: Secondary | ICD-10-CM | POA: Diagnosis not present

## 2022-11-30 DIAGNOSIS — M25571 Pain in right ankle and joints of right foot: Secondary | ICD-10-CM | POA: Diagnosis not present

## 2022-11-30 DIAGNOSIS — M25572 Pain in left ankle and joints of left foot: Secondary | ICD-10-CM | POA: Diagnosis not present

## 2022-12-03 ENCOUNTER — Encounter: Payer: Self-pay | Admitting: *Deleted

## 2022-12-03 ENCOUNTER — Ambulatory Visit: Payer: Federal, State, Local not specified - PPO | Admitting: Family Medicine

## 2022-12-03 ENCOUNTER — Other Ambulatory Visit: Payer: Self-pay

## 2022-12-03 VITALS — BP 120/70 | Ht 64.0 in | Wt 176.0 lb

## 2022-12-03 DIAGNOSIS — M170 Bilateral primary osteoarthritis of knee: Secondary | ICD-10-CM

## 2022-12-03 DIAGNOSIS — T148XXA Other injury of unspecified body region, initial encounter: Secondary | ICD-10-CM

## 2022-12-03 MED ORDER — TRIAMCINOLONE ACETONIDE 40 MG/ML IJ SUSP
40.0000 mg | Freq: Once | INTRAMUSCULAR | Status: AC
Start: 2022-12-03 — End: 2022-12-03
  Administered 2022-12-03: 40 mg via INTRA_ARTICULAR

## 2022-12-03 NOTE — Assessment & Plan Note (Signed)
Acute on chronic in nature.  Degenerative changes appreciated on imaging.  - Bilateral injections today. -Could consider gel injections or physical therapy

## 2022-12-03 NOTE — Progress Notes (Signed)
Catherine Hubbard - 62 y.o. female MRN [id number]  Date of birth: Jan 14, 1961  SUBJECTIVE:  Including CC & ROS.  No chief complaint on file.   Catherine Hubbard is a 62 y.o. female that is following up for her knee pain and right leg hematoma and swelling.    Review of Systems See HPI   HISTORY: Past Medical, Surgical, Social, and Family History Reviewed & Updated per EMR.   Pertinent Historical Findings include:  Past Medical History:  Diagnosis Date   Alcohol abuse    Asthma    Esophagitis    GERD (gastroesophageal reflux disease)    Headache    Hearing loss    Hypertension    Multiple sclerosis    Pancreatitis    Vision abnormalities     Past Surgical History:  Procedure Laterality Date   ANKLE FRACTURE SURGERY Bilateral    CARPAL TUNNEL RELEASE Bilateral    CHOLECYSTECTOMY     COLONOSCOPY  11/2016   multiple   ESOPHAGOGASTRODUODENOSCOPY  11/2016   ESOPHAGOGASTRODUODENOSCOPY N/A 02/01/2017   Procedure: ESOPHAGOGASTRODUODENOSCOPY (EGD);  Surgeon: Iva Boop, MD;  Location: [facility] ENDOSCOPY;  Service: Endoscopy;  Laterality: N/A;   ESOPHAGOGASTRODUODENOSCOPY (EGD) WITH PROPOFOL N/A 02/04/2017   Procedure: ESOPHAGOGASTRODUODENOSCOPY (EGD) WITH PROPOFOL;  Surgeon: Meryl Dare, MD;  Location: [facility] ENDOSCOPY;  Service: Endoscopy;  Laterality: N/A;   LAPAROSCOPIC GASTRIC SLEEVE RESECTION  2011   ULNAR NERVE REPAIR Bilateral      PHYSICAL EXAM:  VS: BP 120/70   Ht 5\' 4"  (1.626 m)   Wt 176 lb (79.8 kg)   LMP 08/13/2014 Comment: very few periods  BMI 30.21 kg/m  Physical Exam Gen: NAD, alert, cooperative with exam, well-appearing MSK:  Neurovascularly intact     Aspiration/Injection Procedure Note Catherine Hubbard May 23, 1961  Procedure: Injection Indications: Right knee pain  Procedure Details Consent: Risks of procedure as well as the alternatives and risks of each were explained to the (patient/caregiver).  Consent for procedure  obtained. Time Out: Verified patient identification, verified procedure, site/side was marked, verified correct patient position, special equipment/implants available, medications/allergies/relevent history reviewed, required imaging and test results available.  Performed.  The area was cleaned with iodine and alcohol swabs.    The right knee superior lateral suprapatellar pouch was injected using 4 cc of 1% lidocaine and 0.4 cc of 8.4% sodium bicarbonate on a 22-gauge 1-1/2 inch needle.  The syringe was switched to mixture containing 1 cc's of 40 mg Kenalog and 4 cc's of 0.25% bupivacaine was injected.  Ultrasound was used. Images were obtained in long views showing the injection.     A sterile dressing was applied.  Patient did tolerate procedure well.   Aspiration/Injection Procedure Note Catherine Hubbard 1960-12-27  Procedure: Injection Indications: Left knee pain  Procedure Details Consent: Risks of procedure as well as the alternatives and risks of each were explained to the (patient/caregiver).  Consent for procedure obtained. Time Out: Verified patient identification, verified procedure, site/side was marked, verified correct patient position, special equipment/implants available, medications/allergies/relevent history reviewed, required imaging and test results available.  Performed.  The area was cleaned with iodine and alcohol swabs.    The left knee superior lateral suprapatellar pouch was injected using 4 cc of 1% lidocaine and 0.4 cc of 8.4% sodium bicarbonate on a 22-gauge 1-1/2 inch needle.  The syringe was switched to mixture containing 1 cc's of 40 mg Kenalog and 4 cc's of 0.25% bupivacaine was injected.  Ultrasound was used. Images  were obtained in long views showing the injection.     A sterile dressing was applied.  Patient did tolerate procedure well.   Aspiration/Injection Procedure Note Catherine Hubbard 23-Apr-1961  Procedure:  Aspiration Indications: Right lower leg hematoma  Procedure Details Consent: Risks of procedure as well as the alternatives and risks of each were explained to the (patient/caregiver).  Consent for procedure obtained. Time Out: Verified patient identification, verified procedure, site/side was marked, verified correct patient position, special equipment/implants available, medications/allergies/relevent history reviewed, required imaging and test results available.  Performed.  The area was cleaned with iodine and alcohol swabs.    The right lower leg hematoma on the anterior proximal tibia was injected at the proximal portion with 9 cc of 1% lidocaine and 0.9 cc of 8.4% sodium bicarbonate on a 25-gauge 1-1/2 inch needle.  The distal portion of the hematoma was injected with 9 cc of 1% lidocaine and 0.9 cc of 8.4% sodium bicarbonate on a 25-gauge 1-1/2 inch needle.  An 18-gauge 1-1/2 inch needle was inserted at the distal portion of the hematoma.  An 18-gauge 1-1/2 inch needle was inserted at the proximal portion and irrigated with 100 cc of normal saline.  Ultrasound was used. Images were obtained in long views showing the injection.    Amount of Fluid Aspirated:  Character of Fluid: bloody and red colored Fluid was sent for: n/a  A sterile dressing was applied.  Patient did tolerate procedure well.     ASSESSMENT & PLAN:   Hematoma Acutely occurring after recent fall.  She is having swelling in the right lower leg does appear to be associated with a hematoma as opposed to a blood clot. -Counseled on supportive care. -Aspiration today. -May need to consider Doppler testing  Primary osteoarthritis of both knees Acute on chronic in nature.  Degenerative changes appreciated on imaging.  - Bilateral injections today. -Could consider gel injections or physical therapy

## 2022-12-03 NOTE — Addendum Note (Signed)
Addended by: Merrilyn Puma on: 12/03/2022 12:03 PM   Modules accepted: Orders

## 2022-12-03 NOTE — Patient Instructions (Signed)
Good to see you Please use heat on the hematoma and ice on the knees   Please send me a message in MyChart with any questions or updates.  Please see me back in 4 days .   --Dr. Jordan Likes

## 2022-12-03 NOTE — Assessment & Plan Note (Signed)
Acutely occurring after recent fall.  She is having swelling in the right lower leg does appear to be associated with a hematoma as opposed to a blood clot. -Counseled on supportive care. -Aspiration today. -May need to consider Doppler testing

## 2022-12-04 ENCOUNTER — Telehealth (HOSPITAL_BASED_OUTPATIENT_CLINIC_OR_DEPARTMENT_OTHER): Payer: Self-pay

## 2022-12-05 ENCOUNTER — Encounter: Payer: Federal, State, Local not specified - PPO | Admitting: Family

## 2022-12-05 ENCOUNTER — Other Ambulatory Visit: Payer: Self-pay | Admitting: Family

## 2022-12-05 ENCOUNTER — Other Ambulatory Visit: Payer: Federal, State, Local not specified - PPO

## 2022-12-05 DIAGNOSIS — D649 Anemia, unspecified: Secondary | ICD-10-CM

## 2022-12-06 ENCOUNTER — Telehealth (HOSPITAL_BASED_OUTPATIENT_CLINIC_OR_DEPARTMENT_OTHER): Payer: Self-pay

## 2022-12-07 ENCOUNTER — Other Ambulatory Visit: Payer: Self-pay

## 2022-12-07 ENCOUNTER — Ambulatory Visit: Payer: Federal, State, Local not specified - PPO | Admitting: Family Medicine

## 2022-12-07 ENCOUNTER — Inpatient Hospital Stay (HOSPITAL_BASED_OUTPATIENT_CLINIC_OR_DEPARTMENT_OTHER): Payer: Federal, State, Local not specified - PPO | Admitting: Family

## 2022-12-07 ENCOUNTER — Inpatient Hospital Stay: Payer: Federal, State, Local not specified - PPO | Attending: Hematology & Oncology

## 2022-12-07 ENCOUNTER — Encounter: Payer: Self-pay | Admitting: Family

## 2022-12-07 DIAGNOSIS — D509 Iron deficiency anemia, unspecified: Secondary | ICD-10-CM | POA: Diagnosis not present

## 2022-12-07 DIAGNOSIS — D649 Anemia, unspecified: Secondary | ICD-10-CM

## 2022-12-07 LAB — CBC WITH DIFFERENTIAL (CANCER CENTER ONLY)
Abs Immature Granulocytes: 0.04 10*3/uL (ref 0.00–0.07)
Basophils Absolute: 0 10*3/uL (ref 0.0–0.1)
Basophils Relative: 0 %
Eosinophils Absolute: 0.4 10*3/uL (ref 0.0–0.5)
Eosinophils Relative: 4 %
HCT: 31.7 % — ABNORMAL LOW (ref 36.0–46.0)
Hemoglobin: 9.6 g/dL — ABNORMAL LOW (ref 12.0–15.0)
Immature Granulocytes: 0 %
Lymphocytes Relative: 19 %
Lymphs Abs: 1.9 10*3/uL (ref 0.7–4.0)
MCH: 25.4 pg — ABNORMAL LOW (ref 26.0–34.0)
MCHC: 30.3 g/dL (ref 30.0–36.0)
MCV: 83.9 fL (ref 80.0–100.0)
Monocytes Absolute: 1.6 10*3/uL — ABNORMAL HIGH (ref 0.1–1.0)
Monocytes Relative: 15 %
Neutro Abs: 6.2 10*3/uL (ref 1.7–7.7)
Neutrophils Relative %: 62 %
Platelet Count: 428 10*3/uL — ABNORMAL HIGH (ref 150–400)
RBC: 3.78 MIL/uL — ABNORMAL LOW (ref 3.87–5.11)
RDW: 18.2 % — ABNORMAL HIGH (ref 11.5–15.5)
WBC Count: 10.1 10*3/uL (ref 4.0–10.5)
nRBC: 0 % (ref 0.0–0.2)

## 2022-12-07 LAB — CMP (CANCER CENTER ONLY)
ALT: 9 U/L (ref 0–44)
AST: 17 U/L (ref 15–41)
Albumin: 3.9 g/dL (ref 3.5–5.0)
Alkaline Phosphatase: 108 U/L (ref 38–126)
Anion gap: 9 (ref 5–15)
BUN: 14 mg/dL (ref 8–23)
CO2: 35 mmol/L — ABNORMAL HIGH (ref 22–32)
Calcium: 9 mg/dL (ref 8.9–10.3)
Chloride: 97 mmol/L — ABNORMAL LOW (ref 98–111)
Creatinine: 0.72 mg/dL (ref 0.44–1.00)
GFR, Estimated: >60 mL/min (ref >60)
Glucose, Bld: 91 mg/dL (ref 70–99)
Potassium: 3.7 mmol/L (ref 3.5–5.1)
Sodium: 141 mmol/L (ref 135–145)
Total Bilirubin: 0.5 mg/dL (ref 0.3–1.2)
Total Protein: 6.5 g/dL (ref 6.5–8.1)

## 2022-12-07 LAB — FERRITIN: Ferritin: 13 ng/mL (ref 11–307)

## 2022-12-07 LAB — RETICULOCYTES
Immature Retic Fract: 9.7 % (ref 2.3–15.9)
RBC.: 3.76 MIL/uL — ABNORMAL LOW (ref 3.87–5.11)
Retic Count, Absolute: 67.3 10*3/uL (ref 19.0–186.0)
Retic Ct Pct: 1.8 % (ref 0.4–3.1)

## 2022-12-07 LAB — LACTATE DEHYDROGENASE: LDH: 192 U/L (ref 98–192)

## 2022-12-07 NOTE — Progress Notes (Unsigned)
Hematology/Oncology Consultation   Name: Catherine Hubbard      MRN: [id number]    Location: Room/bed info not found  Date: 12/07/2022 Time:3:09 PM   REFERRING PHYSICIAN: Clare Gandy, MD   REASON FOR CONSULT:  Iron deficiency anemia    DIAGNOSIS: Iron deficiency anemia secondary to malabsorption, s/p gastric sleeve surgery in 2011  HISTORY OF PRESENT ILLNESS: Ms. Catherine Hubbard is a pleasant 62 yo caucasian female with recently diagnosed iron deficiency anemia.  She had gastric sleeve surgery back in 2011.  She has not noted any obvious blood loss. No abnormal bruising, no petechiae.  She is symptomatic with fatigue, SOB with exertion and dizziness at times.  No known familial history of anemia.  She states that she needs to have an EGD and colonoscopy but can not schedule until her previous GI office send them her records.  No history of diabetes or thyroid disease.  No personal history of cancer. Both her mother and father had lung cancer.  She is due for her mammogram and will call to reschedule.  No fever, chills, n/v, cough, rash, dizziness, SOB, chest pain, palpitations, abdominal pain or changes in bowel or bladder habits.  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  She uses a walker when ambulating long distances for added support.  Chronic swelling in her lower extremities stable. Pedal pulses are 1+.  She fell 3 weeks ago  No smoking or recreational drug use.  ETOH use around 1-2 six packs of beer per week as well as a few shots of liquor.  Appetite comes and goes. Hydration is good. Weight is stable at 174 lbs.  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   ROS: All other 10 point review of systems is negative.   PAST MEDICAL HISTORY:   Past Medical History:  Diagnosis Date   Alcohol abuse    Asthma    Esophagitis    GERD (gastroesophageal reflux disease)    Headache    Hearing loss    Hypertension    Multiple sclerosis    Pancreatitis     Vision abnormalities     ALLERGIES: Allergies  Allergen Reactions   Ibuprofen Other (See Comments)    Cannot take due to gastric bypass   Nsaids Other (See Comments)    Gastric bypass      MEDICATIONS:  Current Outpatient Medications on File Prior to Visit  Medication Sig Dispense Refill   acetaminophen (TYLENOL) 500 MG tablet Take 500 mg by mouth daily as needed (pain).     albuterol (PROVENTIL) (2.5 MG/3ML) 0.083% nebulizer solution Take 3 mLs (2.5 mg total) by nebulization every 6 (six) hours as needed for wheezing or shortness of breath. (Patient taking differently: Take 2.5 mg by nebulization as needed for wheezing or shortness of breath.) 150 mL 1   albuterol (VENTOLIN HFA) 108 (90 Base) MCG/ACT inhaler TAKE 2 PUFFS BY MOUTH EVERY 6 HOURS AS NEEDED FOR WHEEZE OR SHORTNESS OF BREATH 54 g 1   amitriptyline (ELAVIL) 10 MG tablet TAKE 1 TABLET BY MOUTH EVERYDAY AT BEDTIME 30 tablet 5   Aspirin-Acetaminophen-Caffeine (EXCEDRIN PO) Take 1 tablet by mouth as needed (headache).     baclofen (LIORESAL) 10 MG tablet Take 1 tablet (10 mg total) by mouth 3 (three) times daily as needed for muscle spasms. 90 tablet 11   bismuth subsalicylate (PEPTO BISMOL) 262 MG/15ML suspension Take 30 mLs by mouth daily as needed (acid reflux).     budesonide-formoterol (SYMBICORT) 80-4.5  MCG/ACT inhaler TAKE 2 PUFFS BY MOUTH TWICE A DAY 10.2 each 3   Calcium Carbonate Antacid (TUMS PO) Take 2 tablets by mouth 2 (two) times daily as needed (acid reflux).     Cholecalciferol (D2000 ULTRA STRENGTH) 50 MCG (2000 UT) CAPS Take 2,000 Units by mouth daily.     diclofenac Sodium (VOLTAREN) 1 % GEL Apply 4 g topically 4 (four) times daily. 400 g 1   fluticasone (FLONASE) 50 MCG/ACT nasal spray SPRAY 2 SPRAYS INTO EACH NOSTRIL EVERY DAY 48 mL 3   gabapentin (NEURONTIN) 100 MG capsule TAKE 1 CAPSULE BY MOUTH THREE TIMES A DAY 90 capsule 1   Iron, Ferrous Sulfate, 325 (65 Fe) MG TABS 1 tab po bid 60 tablet 3   meloxicam  (MOBIC) 7.5 MG tablet Take 1 tablet (7.5 mg total) by mouth daily. 30 tablet 2   modafinil (PROVIGIL) 200 MG tablet Take 1 tablet (200 mg total) by mouth daily. 30 tablet 5   Multiple Vitamin (MULTIVITAMIN WITH MINERALS) TABS tablet Take 1 tablet by mouth daily.     mupirocin ointment (BACTROBAN) 2 % Apply 1 Application topically 2 (two) times daily. 22 g 2   nystatin cream (MYCOSTATIN) Apply 1 Application topically 2 (two) times daily. 30 g 1   nystatin cream (MYCOSTATIN) Apply 1 Application topically 2 (two) times daily. 30 g 2   OCREVUS 300 MG/10ML injection Inject 20 mLs (600 mg total) into the vein every 6 (six) months. 20 mL 1   olmesartan (BENICAR) 5 MG tablet 2 tab po q day 180 tablet 3   omeprazole (PRILOSEC) 20 MG capsule Take 40 mg by mouth 2 (two) times daily before a meal.     ondansetron (ZOFRAN) 4 MG tablet TAKE 1 TABLET BY MOUTH EVERY 8 HOURS AS NEEDED FOR NAUSEA AND VOMITING 20 tablet 0   oxybutynin (DITROPAN) 5 MG tablet Take 1 tablet (5 mg total) by mouth 2 (two) times daily. 60 tablet 11   RSV vaccine recomb adjuvanted (AREXVY) 120 MCG/0.5ML injection Inject into the muscle. 0.5 mL 0   venlafaxine XR (EFFEXOR-XR) 37.5 MG 24 hr capsule TAKE 1 CAPSULE BY MOUTH EVERY DAY 90 capsule 1   No current facility-administered medications on file prior to visit.     PAST SURGICAL HISTORY Past Surgical History:  Procedure Laterality Date   ANKLE FRACTURE SURGERY Bilateral    CARPAL TUNNEL RELEASE Bilateral    CHOLECYSTECTOMY     COLONOSCOPY  11/2016   multiple   ESOPHAGOGASTRODUODENOSCOPY  11/2016   ESOPHAGOGASTRODUODENOSCOPY N/A 02/01/2017   Procedure: ESOPHAGOGASTRODUODENOSCOPY (EGD);  Surgeon: Iva Boop, MD;  Location: [facility] ENDOSCOPY;  Service: Endoscopy;  Laterality: N/A;   ESOPHAGOGASTRODUODENOSCOPY (EGD) WITH PROPOFOL N/A 02/04/2017   Procedure: ESOPHAGOGASTRODUODENOSCOPY (EGD) WITH PROPOFOL;  Surgeon: Meryl Dare, MD;  Location: [facility] ENDOSCOPY;  Service: Endoscopy;   Laterality: N/A;   LAPAROSCOPIC GASTRIC SLEEVE RESECTION  2011   ULNAR NERVE REPAIR Bilateral     FAMILY HISTORY: Family History  Problem Relation Age of Onset   Cancer Mother    Stroke Mother    Cancer Father     SOCIAL HISTORY:  reports that she has never smoked. She has never used smokeless tobacco. She reports current alcohol use. She reports that she does not use drugs.  PERFORMANCE STATUS: The patient's performance status is 1 - Symptomatic but completely ambulatory  PHYSICAL EXAM: Most Recent Vital Signs: Last menstrual period 08/13/2014. LMP 08/13/2014 Comment: very few periods  General Appearance:  Alert, cooperative, no distress, appears stated age  Head:    Normocephalic, without obvious abnormality, atraumatic  Eyes:    PERRL, conjunctiva/corneas clear, EOM's intact, fundi    benign, both eyes        Throat:   Lips, mucosa, and tongue normal; teeth and gums normal  Neck:   Supple, symmetrical, trachea midline, no adenopathy;    thyroid:  no enlargement/tenderness/nodules; no carotid   bruit or JVD  Back:     Symmetric, no curvature, ROM normal, no CVA tenderness  Lungs:     Clear to auscultation bilaterally, respirations unlabored  Chest Wall:    No tenderness or deformity   Heart:    Regular rate and rhythm, S1 and S2 normal, no murmur, rub   or gallop     Abdomen:     Soft, non-tender, bowel sounds active all four quadrants,    no masses, no organomegaly        Extremities:   Extremities normal, atraumatic, no cyanosis or edema  Pulses:   2+ and symmetric all extremities  Skin:   Skin color, texture, turgor normal, no rashes or lesions  Lymph nodes:   Cervical, supraclavicular, and axillary nodes normal  Neurologic:   CNII-XII intact, normal strength, sensation and reflexes    throughout    LABORATORY DATA:  Results for orders placed or performed in visit on 12/07/22 (from the past 48 hour(s))  CBC with Differential (Cancer Center Only)     Status:  Abnormal   Collection Time: 12/07/22  2:57 PM  Result Value Ref Range   WBC Count 10.1 4.0 - 10.5 K/uL   RBC 3.78 (L) 3.87 - 5.11 MIL/uL   Hemoglobin 9.6 (L) 12.0 - 15.0 g/dL   HCT 16.1 (L) 09.6 - 04.5 %   MCV 83.9 80.0 - 100.0 fL   MCH 25.4 (L) 26.0 - 34.0 pg   MCHC 30.3 30.0 - 36.0 g/dL   RDW 40.9 (H) 81.1 - 91.4 %   Platelet Count 428 (H) 150 - 400 K/uL   nRBC 0.0 0.0 - 0.2 %   Neutrophils Relative % 62 %   Neutro Abs 6.2 1.7 - 7.7 K/uL   Lymphocytes Relative 19 %   Lymphs Abs 1.9 0.7 - 4.0 K/uL   Monocytes Relative 15 %   Monocytes Absolute 1.6 (H) 0.1 - 1.0 K/uL   Eosinophils Relative 4 %   Eosinophils Absolute 0.4 0.0 - 0.5 K/uL   Basophils Relative 0 %   Basophils Absolute 0.0 0.0 - 0.1 K/uL   Immature Granulocytes 0 %   Abs Immature Granulocytes 0.04 0.00 - 0.07 K/uL    Comment: Performed at [facility] at [facility], [address], [place], [place] [address]  Reticulocytes     Status: Abnormal   Collection Time: 12/07/22  2:58 PM  Result Value Ref Range   Retic Ct Pct 1.8 0.4 - 3.1 %   RBC. 3.76 (L) 3.87 - 5.11 MIL/uL   Retic Count, Absolute 67.3 19.0 - 186.0 K/uL   Immature Retic Fract 9.7 2.3 - 15.9 %    Comment: Performed at [facility] at [facility], [address], [place], [place] [address]      RADIOGRAPHY: No results found.     PATHOLOGY: None  ASSESSMENT/PLAN: Ms. Hollman is a pleasant 62 yo caucasian female with recently diagnosed iron deficiency anemia.   We will get her set up for one  dose of IV iron this week.  Follow-up in 6 weeks.   All questions were answered. The patient knows to call the clinic with any problems, questions or concerns. We can certainly see the patient much sooner if necessary.   Eileen Stanford, NP

## 2022-12-09 LAB — ERYTHROPOIETIN: Erythropoietin: 47.5 m[IU]/mL — ABNORMAL HIGH (ref 2.6–18.5)

## 2022-12-10 ENCOUNTER — Telehealth (HOSPITAL_BASED_OUTPATIENT_CLINIC_OR_DEPARTMENT_OTHER): Payer: Self-pay

## 2022-12-10 ENCOUNTER — Ambulatory Visit: Payer: Federal, State, Local not specified - PPO | Admitting: Family Medicine

## 2022-12-10 ENCOUNTER — Encounter: Payer: Self-pay | Admitting: Family

## 2022-12-10 LAB — IRON AND IRON BINDING CAPACITY (CC-WL,HP ONLY)
Iron: 29 ug/dL (ref 28–170)
Saturation Ratios: 6 % — ABNORMAL LOW (ref 10.4–31.8)
TIBC: 480 ug/dL — ABNORMAL HIGH (ref 250–450)
UIBC: 451 ug/dL — ABNORMAL HIGH (ref 148–442)

## 2022-12-11 ENCOUNTER — Telehealth: Payer: Self-pay

## 2022-12-11 MED ORDER — AMITRIPTYLINE HCL 10 MG PO TABS: ORAL_TABLET | ORAL | 1 refills | Status: DC

## 2022-12-11 NOTE — Telephone Encounter (Signed)
Pharmacy requesting a 90 supply. Approved with one refill as Dr. Carlis Abbott gave patient 5 refills.

## 2022-12-12 ENCOUNTER — Other Ambulatory Visit: Payer: Federal, State, Local not specified - PPO

## 2022-12-17 ENCOUNTER — Other Ambulatory Visit: Payer: Self-pay

## 2022-12-17 ENCOUNTER — Other Ambulatory Visit (HOSPITAL_BASED_OUTPATIENT_CLINIC_OR_DEPARTMENT_OTHER): Payer: Self-pay | Admitting: Medical

## 2022-12-17 ENCOUNTER — Inpatient Hospital Stay: Payer: Federal, State, Local not specified - PPO

## 2022-12-17 VITALS — BP 124/72 | HR 87 | Temp 97.8°F | Resp 17

## 2022-12-17 DIAGNOSIS — D509 Iron deficiency anemia, unspecified: Secondary | ICD-10-CM | POA: Diagnosis not present

## 2022-12-17 DIAGNOSIS — D508 Other iron deficiency anemias: Secondary | ICD-10-CM

## 2022-12-17 DIAGNOSIS — Z1231 Encounter for screening mammogram for malignant neoplasm of breast: Secondary | ICD-10-CM

## 2022-12-17 MED ORDER — MELOXICAM 7.5 MG PO TABS: 7.5000 mg | ORAL_TABLET | Freq: Every day | ORAL | 2 refills | Status: DC

## 2022-12-17 MED ORDER — SODIUM CHLORIDE 0.9 % IV SOLN: Freq: Once | INTRAVENOUS | Status: AC

## 2022-12-17 MED ORDER — SODIUM CHLORIDE 0.9 % IV SOLN
1000.0000 mg | Freq: Once | INTRAVENOUS | Status: AC
  Administered 2022-12-17: 1000 mg via INTRAVENOUS
  Filled 2022-12-17: qty 10

## 2022-12-17 NOTE — Patient Instructions (Signed)

## 2022-12-20 ENCOUNTER — Encounter: Payer: Self-pay | Admitting: Family Medicine

## 2022-12-20 ENCOUNTER — Ambulatory Visit: Payer: Federal, State, Local not specified - PPO | Admitting: Family Medicine

## 2022-12-20 VITALS — BP 120/78 | Ht 64.0 in | Wt 176.0 lb

## 2022-12-20 DIAGNOSIS — T148XXA Other injury of unspecified body region, initial encounter: Secondary | ICD-10-CM | POA: Diagnosis not present

## 2022-12-20 DIAGNOSIS — M170 Bilateral primary osteoarthritis of knee: Secondary | ICD-10-CM | POA: Diagnosis not present

## 2022-12-20 NOTE — Assessment & Plan Note (Signed)
Doing well since the injections.  - counseled on home exercise therapy and supportive care - could consider gel injections or PT

## 2022-12-20 NOTE — Assessment & Plan Note (Signed)
Doing well since the aspiration. Still having swelling in her lower legs.  - counseled on compression.

## 2022-12-20 NOTE — Progress Notes (Signed)
  Catherine Hubbard - 62 y.o. female MRN [id number]  Date of birth: 03/27/1961  SUBJECTIVE:  Including CC & ROS.  No chief complaint on file.   Catherine Hubbard is a 62 y.o. female that is  following up for her knee pain and hematoma. She is doing well. Having ongoing swelling in her legs.    Review of Systems See HPI   HISTORY: Past Medical, Surgical, Social, and Family History Reviewed & Updated per EMR.   Pertinent Historical Findings include:  Past Medical History:  Diagnosis Date   Alcohol abuse    Asthma    Esophagitis    GERD (gastroesophageal reflux disease)    Headache    Hearing loss    Hypertension    Multiple sclerosis (HCC)    Pancreatitis    Vision abnormalities     Past Surgical History:  Procedure Laterality Date   ANKLE FRACTURE SURGERY Bilateral    CARPAL TUNNEL RELEASE Bilateral    CHOLECYSTECTOMY     COLONOSCOPY  11/2016   multiple   ESOPHAGOGASTRODUODENOSCOPY  11/2016   ESOPHAGOGASTRODUODENOSCOPY N/A 02/01/2017   Procedure: ESOPHAGOGASTRODUODENOSCOPY (EGD);  Surgeon: Iva Boop, MD;  Location: [facility] ENDOSCOPY;  Service: Endoscopy;  Laterality: N/A;   ESOPHAGOGASTRODUODENOSCOPY (EGD) WITH PROPOFOL N/A 02/04/2017   Procedure: ESOPHAGOGASTRODUODENOSCOPY (EGD) WITH PROPOFOL;  Surgeon: Meryl Dare, MD;  Location: [facility] ENDOSCOPY;  Service: Endoscopy;  Laterality: N/A;   LAPAROSCOPIC GASTRIC SLEEVE RESECTION  2011   ULNAR NERVE REPAIR Bilateral      PHYSICAL EXAM:  VS: BP 120/78 (BP Location: Left Arm, Patient Position: Sitting)   Ht 5\' 4"  (1.626 m)   Wt 176 lb (79.8 kg)   LMP 08/13/2014 Comment: very few periods  BMI 30.21 kg/m  Physical Exam Gen: NAD, alert, cooperative with exam, well-appearing MSK:  Neurovascularly intact       ASSESSMENT & PLAN:   Primary osteoarthritis of both knees Doing well since the injections.  - counseled on home exercise therapy and supportive care - could consider gel injections or PT    Hematoma Doing well since the aspiration. Still having swelling in her lower legs.  - counseled on compression.

## 2022-12-25 ENCOUNTER — Other Ambulatory Visit: Payer: Self-pay | Admitting: Medical

## 2022-12-27 ENCOUNTER — Inpatient Hospital Stay (HOSPITAL_BASED_OUTPATIENT_CLINIC_OR_DEPARTMENT_OTHER): Admission: RE | Admit: 2022-12-27 | Payer: Federal, State, Local not specified - PPO | Source: Ambulatory Visit

## 2022-12-27 NOTE — Telephone Encounter (Signed)
Rx refill sent to pharmacy. Zofran.

## 2023-01-01 ENCOUNTER — Telehealth (HOSPITAL_BASED_OUTPATIENT_CLINIC_OR_DEPARTMENT_OTHER): Payer: Self-pay

## 2023-01-02 ENCOUNTER — Telehealth: Payer: Self-pay | Admitting: Pharmacy Technician

## 2023-01-02 ENCOUNTER — Encounter (HOSPITAL_COMMUNITY): Payer: Self-pay | Admitting: Pharmacy Technician

## 2023-01-02 NOTE — Telephone Encounter (Signed)
Patient Advocate Encounter  Prior Authorization for Modafinil 200MG  tablets  has been approved.    PA# [id number] Insurance [employer] Electronic PA Form Effective dates: 01/02/2023 through 01/02/2024

## 2023-01-02 NOTE — Telephone Encounter (Signed)
error 

## 2023-01-02 NOTE — Telephone Encounter (Signed)
Patient Advocate Encounter   Received notification that prior authorization for Modafinil 200MG  tablets is required.   PA submitted on 01/02/2023 [facility] Insurance [employer] Electronic PA Form Status is pending

## 2023-01-03 ENCOUNTER — Encounter
Payer: Federal, State, Local not specified - PPO | Attending: Physical Medicine and Rehabilitation | Admitting: Physical Medicine and Rehabilitation

## 2023-01-03 VITALS — BP 137/92 | HR 76 | Ht 64.0 in | Wt 173.0 lb

## 2023-01-03 DIAGNOSIS — S32000S Wedge compression fracture of unspecified lumbar vertebra, sequela: Secondary | ICD-10-CM

## 2023-01-03 DIAGNOSIS — M5481 Occipital neuralgia: Secondary | ICD-10-CM

## 2023-01-03 DIAGNOSIS — I10 Essential (primary) hypertension: Secondary | ICD-10-CM | POA: Diagnosis not present

## 2023-01-03 DIAGNOSIS — S8001XA Contusion of right knee, initial encounter: Secondary | ICD-10-CM | POA: Diagnosis not present

## 2023-01-03 MED ORDER — BUPIVACAINE HCL (PF) 0.5 % IJ SOLN
4.0000 mL | Freq: Once | INTRAMUSCULAR | Status: AC
Start: 2023-01-03 — End: 2023-01-03
  Administered 2023-01-03: 4 mL

## 2023-01-03 NOTE — Progress Notes (Signed)
Subjective:    Patient ID: Catherine Hubbard, female    DOB: [birth year], 62 y.o.   MRN: [id number]  HPI  [name] presents for follow-up of lumbar radicular pain and cervical spine pain, and occipital neuralgia  1) Cervical myofascial pain syndrome: -the trigger point injections helped previously -She continues to have tension and would like to repeat the trigger point injections next visit.   2) Impaired mobility and ADLs -She has difficulty lifting weights and this makes it hard for her to find a job.   3) Cervical spinal stenosis: She currently has pain radiating from her neck down to her ankles.  -The pain is constant, irritating, burning stabbing. -The Meloxicam is helping- she takes when pain is getting worse.  -having pain radiating down her left arm -very bothersome to her -worse when she leans forward to watch TV and when she leans neck backward Radiates down to the lateral aspect of her arm.  -somewhat interested in cervical traction device -has seen PT before  4) Alcohol abuse: -Has led to falls in the past.  -she is doing better.   5) spinal compression fracture: -fell off her bed and suffered a spinal compression fracture -she is wearing a spinal orthosis -she was prescribed oxycodone and used this once yesterday -she takes vitamin D every day.  -interested in trying zynex  6) Depression -fluctuates in severity -still taking effexor and Prozac.  -has had recent deaths in her family.   7) Right knee hematoma -has required bilateral steroid injections  Prior history: She threw things in her home last night due to her pain.   She has pain radiating into her legs, as well as swelling.   Her frustration level has been very high recently.   She would be interested in a repeat ESI and her pain is now present in the left lower extremity as well.   Prior history:  Lower back pain:  08/25/19: Her lower back pain is worst with extension and is  present in her bilateral lower back. Since last visit she has been using a lumbar support brace which does provide some relief. She has been doing her HEP every day. She has had steroid injections in the past that did provide her with relief. She has pain in bilateral lower extremities as well in the anterior and lateral distributions. This pain is worse with movement-walking, standing, and when she lays flat.   10/06/19: On 1/21 she had a right sided L4-L5 epidural steroid injection with Dr. Wynn Banker. Her back pain is now much improved.    Bilateral hip pain: She has had some bilateral hip pain. She has been doing exercises at home for this. But she is now able to walk a lot more. She sometimes goes to the grocery store for 2 hours at a time to do her shopping as well as get exercise in walking. She still has difficulty with stairs.    Poor energy:  2/16: Her energy is now much improved and she has stopped taking the Ritalin.    Depression:  1/05: She say a psychiatrist in the past and was prescribed Prozac, Effexor, and Cymbalta which she found to be helpful for her depression. Last visit we restarted her on Prozac and this did provide some relief. She has not been following with the psychiatrist any more and does not want to at this time.    2/16: Her mood is much improved with these medications and she is not experiencing any  adverse side effects. Her friends tell her she is like a new person and she has excellent energy. 1 month ago she says she felt she could not return to work but now she feels that she may be able to.    Left CMC joint pain: She notes increased swelling in this joint compared to the opposite side and it can sometimes pain her a lot. She has a listed allergy to NSAIDs but said this was gastritis from ibuprofen more than 10 years ago. She has since taken ibuprofen without issues. Has never had any imaging of her hand or splint or steroid injection.   6/9: She was dong really  well except for a month ago, when her back pain and lowe leg cramping returned. She feels soreness in both legs as well as in her back. She would be interested in repeat steroid injection. She feels like her fatigue has worsened and she is sleeping 16 hours per day. She does not have interest in anything. She is most bored in the afternoons. Her husband tries to watch TV with her in the afternoons to stimulate her. She used to like to talk to her neighbors but she has had some issues of her own. Her roommate is a godfearing woman and she does not want to be preached to from her.    Pain Inventory Average Pain 3  Pain Right Now 8 My pain is intermittent, sharp, burning, and aching  In the last 24 hours, has pain interfered with the following? General activity 3 Relation with others 3 Enjoyment of life 3 What TIME of day is your pain at its worst? morning,daytime, evening, night, anytime Sleep (in general) Fair  Pain is worse with: sitting Pain improves with: heat/ice and TENS Relief from Meds: 2         Family History  Problem Relation Age of Onset   Cancer Mother    Stroke Mother    Cancer Father    Social History   Socioeconomic History   Marital status: Married    Spouse name: Not on file   Number of children: Not on file   Years of education: Not on file   Highest education level: Not on file  Occupational History   Not on file  Tobacco Use   Smoking status: Never   Smokeless tobacco: Never   Tobacco comments:    second hand smoker when young. Heavy exposure.  Vaping Use   Vaping Use: Never used  Substance and Sexual Activity   Alcohol use: Yes    Alcohol/week: 6.0 standard drinks of alcohol    Types: 6 Cans of beer per week    Comment: daily, couple of shots ( weekly depending on day)   Drug use: No   Sexual activity: Not Currently  Other Topics Concern   Not on file  Social History Narrative   Not on file   Social Determinants of Health   Financial  Resource Strain: Not on file  Food Insecurity: Not on file  Transportation Needs: Not on file  Physical Activity: Not on file  Stress: Not on file  Social Connections: Not on file   Past Surgical History:  Procedure Laterality Date   ANKLE FRACTURE SURGERY Bilateral    CARPAL TUNNEL RELEASE Bilateral    CHOLECYSTECTOMY     COLONOSCOPY  11/2016   multiple   ESOPHAGOGASTRODUODENOSCOPY  11/2016   ESOPHAGOGASTRODUODENOSCOPY N/A 02/01/2017   Procedure: ESOPHAGOGASTRODUODENOSCOPY (EGD);  Surgeon: Iva Boop, MD;  Location:  [facility] END[facility];  Service: Endoscopy;  Laterality: N/A;   ESOPHAGOGASTRODUODENOSCOPY (EGD) WITH PROPOFOL N/A 02/04/2017   Procedure: ESOPHAGOGASTRODUODENOSCOPY (EGD) WITH PROPOFOL;  Surgeon: Meryl Dare, MD;  Location: [facility] ENDOSCOPY;  Service: Endoscopy;  Laterality: N/A;   LAPAROSCOPIC GASTRIC SLEEVE RESECTION  2011   ULNAR NERVE REPAIR Bilateral    Past Medical History:  Diagnosis Date   Alcohol abuse    Asthma    Esophagitis    GERD (gastroesophageal reflux disease)    Headache    Hearing loss    Hypertension    Multiple sclerosis (HCC)    Pancreatitis    Vision abnormalities    BP (!) 137/92   Pulse 76   Ht 5\' 4"  (1.626 m)   Wt 173 lb (78.5 kg)   LMP 08/13/2014 Comment: very few periods  SpO2 98%   BMI 29.70 kg/m   Opioid Risk Score:   Fall Risk Score:  `1  Depression screen [facility] 2/9     09/21/2022   10:53 AM 08/01/2022   10:38 AM 04/27/2022   11:21 AM 11/06/2021    9:07 AM 06/06/2021    9:16 AM 02/24/2021    3:04 PM 08/24/2020    9:50 AM  Depression screen PHQ 2/9  Decreased Interest 0 0 0 0 1 1 1   Down, Depressed, Hopeless 0 0 0 0 1 1 1   PHQ - 2 Score 0 0 0 0 2 2 2     Review of Systems  Constitutional: Negative.   Eyes: Negative.   Respiratory: Negative.    Endocrine: Negative.   Genitourinary: Negative.   Musculoskeletal:  Positive for neck pain.       Left arm  Allergic/Immunologic: Negative.        Objective:   Physical  Exam  General: Alert and oriented x 3, No apparent distress HEENT: Head is normocephalic, atraumatic, PERRLA, EOMI, sclera anicteric, oral mucosa pink and moist, dentition intact, ext ear canals clear,  Neck: Supple without JVD or lymphadenopathy Heart: Reg rate and rhythm. No murmurs rubs or gallops Chest: CTA bilaterally without wheezes, rales, or rhonchi; no distress Abdomen: Soft, non-tender, non-distended, bowel sounds positive. Extremities: No clubbing, cyanosis, or edema. Pulses are 2+ Psych: Pt's affect is appropriate. Pt is cooperative Skin: Clean and intact without signs of breakdown Ext: bilateral lower extremity edema Skin: bruising left arm Neuro/Musculoskeletal: Able to walk in office without falls with RW.  Remains sitting most of this session in contrast to last session. Able to bend down and touch her toes with FROM. Diffuse tenderness along spine with gentle palpation. Extension and oblique extension reproduce pain in lumbar spine. 5/5 strength in bilateral arms. 4-/5 in bilateral legs due to diffuse weakness. No focal motor or sensory deficits. + Slump test in bilateral lower extremities.   Has increased swelling at left [facility] joint with increased tenderness to palpation compared to opposite side.  Range of motion of neck limited to 40 degrees bilateral, tingling in neck reproduced with neck flexion.  Psych: pleasant, normal affect, attention is significantly improved from last visit.        Assessment & Plan:  Catherine Hubbard is a 62 y.o. female with history of alcohol abuse, multiple sclerosis and frequent falls, presenting for follow-up for her diffuse chronic pain and depression., cervical and lumbar spine pain, compression fracture.    1) Poor energy: The Ritalin has really helped with her attention and energy and has helped her to be more active. Her energy is tremendously improved  and she no longer requires the Ritalin which she has stopped taking on her own.     2) Depression: Continue Prozac and Effexor for depression.She is experiencing worsening anhedonia and excessive sleep. Increase Prozac to 40mg .   -Has become more severe. Add Amitriptyline 10mg  HS.    Keep a daily log of goals and check them off every day.   Maintain social connections in your life. Continue exercising with your neighbors.    Walk at least 15 minutes every day, preferably outside.    Look for local organizations and programs that can add meaning and friendship to your life.    Make an effort to do activities that you enjoy, such as going to the [employer], shopping, [employer] (cost is an issue here), consider return to work in a sedentary job.      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  -I have personally reviewed Lumbar MRI which shows mild-moderate stenosis. On 1/21 she had a right sided L4-L5 epidural steroid injection with Dr. Wynn Banker with great improvement in symptoms.  Will refer for repeat injection.  -Discussed Meloxicam as an option to consider in the future.  -Discussed current symptoms of pain and history of pain.  -Discussed benefits of exercise in reducing pain. -Discussed following foods that may reduce pain: 1) Ginger (especially studied for arthritis)- reduce leukotriene production to decrease inflammation 2) Blueberries- high in phytonutrients that decrease inflammation 3) Salmon- marine omega-3s reduce joint swelling and pain 4) Pumpkin seeds- reduce inflammation 5) dark chocolate- reduces inflammation 6) turmeric- reduces inflammation 7) tart cherries - reduce pain and stiffness 8) extra virgin olive oil - its compound olecanthal helps to block prostaglandins  9) chili Hubbard- can be eaten or applied topically via capsaicin 10) mint- helpful for headache, muscle aches, joint pain, and itching 11) garlic- reduces inflammation  Link to further information on diet for chronic pain:  [email or web address]    4) Bilateral greater trochanteric pain syndrome: Continue HEP. Educated regarding etiology and treatment of condition.   5) Impaired mobility and ADLs secondary to Multiple Sclerosis, Major Depression Disorder, L4-L5 lumbar stenosis: Patient's overall mobility and mental health are have regressed a little since last visit. She has been trying to obtain disability for financial support and I provided her with the address and phone number of the social security office and advised her that I am happy to help fill out any paperwork she needs. That being said, I advised her that returning to work on a part-time basis may be very good for her, improving her quality of life, physical function, giving her purpose, and improving her financial situation. She is agreeable to this and feels that she may be able to return to work and will look for jobs that interest her and that will not be too strenuous for her (avoiding lifting heavy weights or climbing stairs). She is still nervous about falling at work. -Encouraged to continue looking for work.    6) Left CMC arthritis: Prescribed diclofenac gel and advised that patient may apply up to 4 times per day. If this does not provide relief asked that patient call our office and I can order an XR and provide a referral for fitting for a splint. If this does not provide relief, we can consider corticosteroid injection.   7) Alcohol abuse: She drinks at least 3 alcoholic beverages per day. She would like to quit. Discussed some of the risks of alcohol use. Made goal to stop alcohol use given  compression fracture. Recommended replacing third drink with Roobois tea.  -Discussed option of support groups or disulfiram.  -prescribed disulfiram  8) Falls: related to alcohol use. If we can get this under control, the falls will decrease.   9) Disrupted circadian rhythm: Sometimes  sleeps at 5am and sleeps during the day. Discussed sleep aides.   10) Cervical myofascial pain syndrome: -Repeat trigger point injections next visit.  11) Cervical spinal stenosis: -discussed physical therapy -continue sit and be fit -continue Meloxicam, taking as needed.  -Keep up the turmeric.  -cervical MRI ordered.  -Discussed current symptoms of pain and history of pain.  -Discussed following foods that may reduce pain: 1) Ginger (especially studied for arthritis)- reduce leukotriene production to decrease inflammation 2) Blueberries- high in phytonutrients that decrease inflammation 3) Salmon- marine omega-3s reduce joint swelling and pain 4) Pumpkin seeds- reduce inflammation 5) dark chocolate- reduces inflammation 6) turmeric- reduces inflammation 7) tart cherries - reduce pain and stiffness 8) extra virgin olive oil - its compound olecanthal helps to block prostaglandins  9) chili Hubbard- can be eaten or applied topically via capsaicin 10) mint- helpful for headache, muscle aches, joint pain, and itching 11) garlic- reduces inflammation  Link to further information on diet for chronic pain: [email or web address]   Link to further information on diet for chronic pain: [email or web address]     All questions were encouraged and answered. Follow up with me in mid-April.   12) Compression fracture: -a little but of goal cheese and green leafy vegetables every day.  -daily 10 minute walk outside.  -can continue oxycodone for very severe pain as prescribed by NSGY. If pain is moderate can consider restarting meloxicam -discussed risks of femur fracture -discussed natural healing process Prescribed Zynex Nexwave -continue amitriptyline  13) Headaches: -waking up every day with headaches .Foods to alleviate migraine:  1) dark leafy  greens 2) avocado 3) tuna 4) samon and mackerel 5) beans and legumes Supplements that can be helpful: feverfew, B12, and magnesium  Foods to avoid in migraine: 1) Excessive (or irregular timing) coffee 2) red wine 3) aged cheeses 4) chocolate 5) citrus fruits 6) aspartame and other artifical sweeteners 7) yeast 8) MSG (in processed foods) 9) processed and cured meats 10) nuts and certain seeds 11) chicken livers and other organ meats 12) dairy products like buttermilk, sour cream, and yogurt 13) dried fruits like dates, figs, and raisins 14) garlic 15) onions 16) potato chips 17) pickled foods like olives and sauerkraut 18) some fresh fruits like ripe banana, papaya, red plums, raspberries, kiwi, pineapple 19) tomato-based products  Recommend to keep a migraine diary: rate daily the severity of your headache (1-10) and what foods you eat that day to help determine patterns.    Occipital nerve block procedure note:  Written consent was obtained for the patient. Location for greater and lesser occipital nerves was determined using bony landmarks and points of maximal sensitivity.  The areas were cleaned with alcohol, vapocoolant spray applied, needle drawback performed, and a total of 4 cc of 0.5% Marcaine was injected to both these sites.  There were no complications from the procedure, and it was well tolerated.    -continue amitripytline  20) Hematoma of right knee: -discussed recent bilateral steroid injections  21. HTN: -Advised checking BP daily at home and logging results to bring into follow-up appointment with PCP and myself. -Reviewed BP meds today.  -Advised regarding healthy foods that can help lower blood pressure and provided with a list: 1)  citrus foods- high in vitamins and minerals 2) salmon and other fatty fish - reduces inflammation and oxylipins 3) swiss chard (leafy green)- high level of nitrates 4) pumpkin seeds- one of the best natural sources of  magnesium 5) Beans and lentils- high in fiber, magnesium, and potassium 6) Berries- high in flavonoids 7) Amaranth (whole grain, can be cooked similarly to rice and oats)- high in magnesium and fiber 8) Pistachios- even more effective at reducing BP than other nuts 9) Carrots- high in phenolic compounds that relax blood vessels and reduce inflammation 10) Celery- contain phthalides that relax tissues of arterial walls 11) Tomatoes- can also improve cholesterol and reduce risk of heart disease 12) Broccoli- good source of magnesium, calcium, and potassium 13) Greek yogurt: high in potassium and calcium 14) Herbs and spices: Celery seed, cilantro, saffron, lemongrass, black cumin, ginseng, cinnamon, cardamom, sweet basil, and ginger 15) Chia and flax seeds- also help to lower cholesterol and blood sugar 16) Beets- high levels of nitrates that relax blood vessels  17) spinach and bananas- high in potassium  -Provided lise of supplements that can help with hypertension:  1) magnesium: one high quality brand is Bioptemizers since it contains all 7 types of magnesium, otherwise over the counter magnesium gluconate 400mg  is a good option 2) B vitamins 3) vitamin D 4) potassium 5) CoQ10 6) L-arginine 7) Vitamin C 8) Beetroot -Educated that goal BP is 120/80. -Made goal to incorporate some of the above foods into diet.

## 2023-01-18 ENCOUNTER — Inpatient Hospital Stay (HOSPITAL_BASED_OUTPATIENT_CLINIC_OR_DEPARTMENT_OTHER): Payer: Federal, State, Local not specified - PPO | Admitting: Family

## 2023-01-18 ENCOUNTER — Other Ambulatory Visit: Payer: Self-pay

## 2023-01-18 ENCOUNTER — Inpatient Hospital Stay: Payer: Federal, State, Local not specified - PPO | Attending: Hematology & Oncology

## 2023-01-18 ENCOUNTER — Encounter: Payer: Self-pay | Admitting: Family

## 2023-01-18 VITALS — BP 161/91 | HR 85 | Temp 98.9°F | Resp 19 | Ht 64.0 in | Wt 172.1 lb

## 2023-01-18 DIAGNOSIS — D508 Other iron deficiency anemias: Secondary | ICD-10-CM | POA: Diagnosis not present

## 2023-01-18 DIAGNOSIS — D509 Iron deficiency anemia, unspecified: Secondary | ICD-10-CM | POA: Insufficient documentation

## 2023-01-18 DIAGNOSIS — D649 Anemia, unspecified: Secondary | ICD-10-CM

## 2023-01-18 LAB — CBC WITH DIFFERENTIAL (CANCER CENTER ONLY)
Abs Immature Granulocytes: 0.02 10*3/uL (ref 0.00–0.07)
Basophils Absolute: 0.1 10*3/uL (ref 0.0–0.1)
Basophils Relative: 1 %
Eosinophils Absolute: 0.7 10*3/uL — ABNORMAL HIGH (ref 0.0–0.5)
Eosinophils Relative: 9 %
HCT: 37.7 % (ref 36.0–46.0)
Hemoglobin: 12.1 g/dL (ref 12.0–15.0)
Immature Granulocytes: 0 %
Lymphocytes Relative: 22 %
Lymphs Abs: 1.8 10*3/uL (ref 0.7–4.0)
MCH: 28.5 pg (ref 26.0–34.0)
MCHC: 32.1 g/dL (ref 30.0–36.0)
MCV: 88.9 fL (ref 80.0–100.0)
Monocytes Absolute: 0.9 10*3/uL (ref 0.1–1.0)
Monocytes Relative: 11 %
Neutro Abs: 4.6 10*3/uL (ref 1.7–7.7)
Neutrophils Relative %: 57 %
Platelet Count: 269 10*3/uL (ref 150–400)
RBC: 4.24 MIL/uL (ref 3.87–5.11)
RDW: 20.7 % — ABNORMAL HIGH (ref 11.5–15.5)
WBC Count: 8.1 10*3/uL (ref 4.0–10.5)
nRBC: 0 % (ref 0.0–0.2)

## 2023-01-18 LAB — RETICULOCYTES
Immature Retic Fract: 8 % (ref 2.3–15.9)
RBC.: 4.19 MIL/uL (ref 3.87–5.11)
Retic Count, Absolute: 47.8 10*3/uL (ref 19.0–186.0)
Retic Ct Pct: 1.1 % (ref 0.4–3.1)

## 2023-01-18 LAB — FERRITIN: Ferritin: 132 ng/mL (ref 11–307)

## 2023-01-18 LAB — IRON AND IRON BINDING CAPACITY (CC-WL,HP ONLY)
Iron: 155 ug/dL (ref 28–170)
Saturation Ratios: 50 % — ABNORMAL HIGH (ref 10.4–31.8)
TIBC: 312 ug/dL (ref 250–450)
UIBC: 157 ug/dL (ref 148–442)

## 2023-01-18 NOTE — Progress Notes (Signed)
Hematology and Oncology Follow Up Visit  [name] [id number] 05-29-1961 62 y.o. 01/18/2023   Principle Diagnosis:  Iron deficiency anemia secondary to malabsorption, s/p gastric sleeve surgery in 2011   Current Therapy:   IV iron as indicated    Interim History:  Ms. Catherine Hubbard is here today for follow-up. She is symptomatic with fatigue.  She states that she felt good for several days after her infusion but then starrted feeling tired again.  She has chronic swelling in her knee and lower extremities and plans to stop and make an appointment with sports medicine on her way out today. She states that she has had fluid drained from her knees in the past.  No falls or syncope reported.  Neuropathy in her feet unchanged from baseline.  No fever, chills, n/v, rash, dizziness, SOB, chest pain, palpitations, abdominal pain or changes in bowel or bladder habits.  She has a dry cough that comes and goes with her allergies.  Appetite and hydration are good. Weight is stable at 172 lbs.   ECOG Performance Status: 1 - Symptomatic but completely ambulatory  Medications:  Allergies as of 01/18/2023       Reactions   Ibuprofen Other (See Comments)   Cannot take due to gastric bypass   Nsaids Other (See Comments)   Gastric bypass        Medication List        Accurate as of Jan 18, 2023 12:51 PM. If you have any questions, ask your nurse or doctor.          acetaminophen 500 MG tablet Commonly known as: TYLENOL Take 500 mg by mouth daily as needed (pain).   ACIDOPHILUS LACTOBACILLUS PO Take by mouth.   Ventolin HFA 108 (90 Base) MCG/ACT inhaler Generic drug: albuterol Inhale into the lungs. What changed: Another medication with the same name was changed. Make sure you understand how and when to take each.   albuterol (2.5 MG/3ML) 0.083% nebulizer solution Commonly known as: PROVENTIL Take 3 mLs (2.5 mg total) by nebulization every 6 (six) hours as needed  for wheezing or shortness of breath. What changed: when to take this   ALOE VERA JUICE PO Take by mouth.   amitriptyline 10 MG tablet Commonly known as: ELAVIL TAKE 1 TABLET BY MOUTH EVERYDAY AT BEDTIME   Arexvy 120 MCG/0.5ML injection Generic drug: RSV vaccine recomb adjuvanted Inject into the muscle.   baclofen 10 MG tablet Commonly known as: LIORESAL Take 1 tablet (10 mg total) by mouth 3 (three) times daily as needed for muscle spasms.   bismuth subsalicylate 262 MG/15ML suspension Commonly known as: PEPTO BISMOL Take 30 mLs by mouth daily as needed (acid reflux).   budesonide-formoterol 80-4.5 MCG/ACT inhaler Commonly known as: SYMBICORT TAKE 2 PUFFS BY MOUTH TWICE A DAY   buPROPion 100 MG tablet Commonly known as: WELLBUTRIN Take by mouth.   chlorthalidone 25 MG tablet Commonly known as: HYGROTON Take by mouth.   Cyanocobalamin 1000 MCG Tbcr Take by mouth.   D2000 Ultra Strength 50 MCG (2000 UT) Caps Generic drug: Cholecalciferol Take 2,000 Units by mouth daily.   diclofenac Sodium 1 % Gel Commonly known as: VOLTAREN Apply 4 g topically 4 (four) times daily.   DULoxetine 60 MG capsule Commonly known as: CYMBALTA Take by mouth.   EXCEDRIN PO Take 1 tablet by mouth as needed (headache).   FLUoxetine 20 MG capsule Commonly known as: PROZAC Take by mouth.   fluticasone 50 MCG/ACT nasal spray Commonly  known as: FLONASE SPRAY 2 SPRAYS INTO EACH NOSTRIL EVERY DAY   gabapentin 100 MG capsule Commonly known as: NEURONTIN TAKE 1 CAPSULE BY MOUTH THREE TIMES A DAY   HYDROcodone-acetaminophen 5-325 MG tablet Commonly known as: NORCO/VICODIN Take by mouth.   Iron (Ferrous Sulfate) 325 (65 Fe) MG Tabs 1 tab po bid   losartan 25 MG tablet Commonly known as: COZAAR Take by mouth.   meloxicam 7.5 MG tablet Commonly known as: Mobic Take 1 tablet (7.5 mg total) by mouth daily.   modafinil 200 MG tablet Commonly known as: PROVIGIL Take 1 tablet (200  mg total) by mouth daily.   multivitamin with minerals Tabs tablet Take 1 tablet by mouth daily.   mupirocin ointment 2 % Commonly known as: BACTROBAN Apply 1 Application topically 2 (two) times daily.   nystatin cream Commonly known as: MYCOSTATIN Apply 1 Application topically 2 (two) times daily.   Ocrevus 300 MG/10ML injection Generic drug: ocrelizumab Inject 20 mLs (600 mg total) into the vein every 6 (six) months.   olmesartan 20 MG tablet Commonly known as: BENICAR Take by mouth.   olmesartan 5 MG tablet Commonly known as: BENICAR 2 tab po q day   OMEGA-3 COMPLEX PO Take by mouth.   omeprazole 20 MG capsule Commonly known as: PRILOSEC Take 40 mg by mouth 2 (two) times daily before a meal.   ondansetron 4 MG tablet Commonly known as: ZOFRAN TAKE 1 TABLET BY MOUTH EVERY 8 HOURS AS NEEDED FOR NAUSEA AND VOMITING   ondansetron 8 MG disintegrating tablet Commonly known as: ZOFRAN-ODT Take by mouth.   oxybutynin 5 MG tablet Commonly known as: DITROPAN Take 1 tablet (5 mg total) by mouth 2 (two) times daily.   potassium chloride SA 20 MEQ tablet Commonly known as: KLOR-CON M Take by mouth.   Rebif Rebidose 44 MCG/0.5ML Soaj Generic drug: Interferon Beta-1a Inject into the skin.   sucralfate 1 GM/10ML suspension Commonly known as: CARAFATE Take by mouth.   THIAMINE PO Take by mouth.   traMADol 50 MG tablet Commonly known as: ULTRAM   TUMS PO Take 2 tablets by mouth 2 (two) times daily as needed (acid reflux).   valsartan 80 MG tablet Commonly known as: DIOVAN Take by mouth.   venlafaxine XR 37.5 MG 24 hr capsule Commonly known as: EFFEXOR-XR TAKE 1 CAPSULE BY MOUTH EVERY DAY        Allergies:  Allergies  Allergen Reactions   Ibuprofen Other (See Comments)    Cannot take due to gastric bypass   Nsaids Other (See Comments)    Gastric bypass    Past Medical History, Surgical history, Social history, and Family History were reviewed and  updated.  Review of Systems: All other 10 point review of systems is negative.   Physical Exam:  vitals were not taken for this visit.   Wt Readings from Last 3 Encounters:  01/03/23 173 lb (78.5 kg)  12/20/22 176 lb (79.8 kg)  12/03/22 176 lb (79.8 kg)    Ocular: Sclerae unicteric, pupils equal, round and reactive to light Ear-nose-throat: Oropharynx clear, dentition fair Lymphatic: No cervical or supraclavicular adenopathy Lungs no rales or rhonchi, good excursion bilaterally Heart regular rate and rhythm, no murmur appreciated Abd soft, nontender, positive bowel sounds MSK no focal spinal tenderness, no joint edema Neuro: non-focal, well-oriented, appropriate affect Breasts: Deferred   Lab Results  Component Value Date   WBC 8.1 01/18/2023   HGB 12.1 01/18/2023   HCT 37.7 01/18/2023  MCV 88.9 01/18/2023   PLT 269 01/18/2023   Lab Results  Component Value Date   FERRITIN 13 12/07/2022   IRON 29 12/07/2022   TIBC 480 (H) 12/07/2022   UIBC 451 (H) 12/07/2022   IRONPCTSAT 6 (L) 12/07/2022   Lab Results  Component Value Date   RETICCTPCT 1.1 01/18/2023   RBC 4.24 01/18/2023   RBC 4.19 01/18/2023   No results found for: "KPAFRELGTCHN", "LAMBDASER", "KAPLAMBRATIO" Lab Results  Component Value Date   IGGSERUM 898 05/01/2022   IGMSERUM 168 05/01/2022   No results found for: "TOTALPROTELP", "ALBUMINELP", "A1GS", "A2GS", "BETS", "BETA2SER", "GAMS", "MSPIKE", "SPEI"   Chemistry      Component Value Date/Time   NA 141 12/07/2022 1457   NA 140 06/02/2019 1549   K 3.7 12/07/2022 1457   CL 97 (L) 12/07/2022 1457   CO2 35 (H) 12/07/2022 1457   BUN 14 12/07/2022 1457   BUN 15 06/02/2019 1549   CREATININE 0.72 12/07/2022 1457   CREATININE 0.62 11/27/2018 1505      Component Value Date/Time   CALCIUM 9.0 12/07/2022 1457   ALKPHOS 108 12/07/2022 1457   AST 17 12/07/2022 1457   ALT 9 12/07/2022 1457   BILITOT 0.5 12/07/2022 1457       Impression and Plan: Ms.  Parreira is a pleasant 62 yo caucasian female with recently diagnosed iron deficiency anemia.   Iron studies are pending.  Follow-up in 3 months.    Eileen Stanford, NP 5/31/202412:51 PM

## 2023-01-18 NOTE — Addendum Note (Signed)
Addended by: Lenn Sink I on: 01/18/2023 01:35 PM   Modules accepted: Orders

## 2023-01-22 ENCOUNTER — Other Ambulatory Visit: Payer: Self-pay | Admitting: Medical

## 2023-01-30 ENCOUNTER — Ambulatory Visit
Admission: EM | Admit: 2023-01-30 | Discharge: 2023-01-30 | Disposition: A | Discharge status: Home / Self Care | Payer: Federal, State, Local not specified - PPO | Attending: Urgent Care | Admitting: Urgent Care

## 2023-01-30 DIAGNOSIS — R300 Dysuria: Secondary | ICD-10-CM | POA: Diagnosis not present

## 2023-01-30 DIAGNOSIS — N3001 Acute cystitis with hematuria: Secondary | ICD-10-CM | POA: Diagnosis not present

## 2023-01-30 DIAGNOSIS — F1010 Alcohol abuse, uncomplicated: Secondary | ICD-10-CM | POA: Diagnosis not present

## 2023-01-30 DIAGNOSIS — R600 Localized edema: Secondary | ICD-10-CM

## 2023-01-30 LAB — POCT URINALYSIS DIP (MANUAL ENTRY)
Bilirubin, UA: NEGATIVE
Glucose, UA: NEGATIVE mg/dL
Ketones, POC UA: NEGATIVE mg/dL
Nitrite, UA: POSITIVE — AB
Protein Ur, POC: NEGATIVE mg/dL
Spec Grav, UA: 1.015 (ref 1.010–1.025)
Urobilinogen, UA: 0.2 E.U./dL
pH, UA: 6 (ref 5.0–8.0)

## 2023-01-30 MED ORDER — CEPHALEXIN 500 MG PO CAPS: 500.0000 mg | ORAL_CAPSULE | Freq: Three times a day (TID) | ORAL | 0 refills | Status: DC

## 2023-01-30 NOTE — Discharge Instructions (Signed)

## 2023-01-30 NOTE — ED Provider Notes (Signed)
[facility]  Note:  This document was prepared using Conservation officer, historic buildings and may include unintentional dictation errors.  MRN: [id number] DOB: February 01, 1961  Subjective:   Catherine Hubbard is a 62 y.o. female presenting for 1 month history of persistent dysuria, urinary frequency, urinary urgency, malodorous urine. Has a history of UTI, cystitis. Takes oxybutynin regularly.  Has about 32-48 ounces of water daily. Has 1 cup of coffee daily. Has 1-5 diet sodas daily. Has 3-12 drinks of alcohol daily (mix of beers, liquor). Has a history of alcohol abuse. Over the past month has had lower leg swelling. Has improved over the month but wanted to mention it.  Patient eats a lot of high-protein foods, smoothies and yogurt, shakes that she makes, protein shakes.  No chest pain, dyspnea apart from her normal asthma. Takes Symbicort for her asthma. Has MS. No history of CKD, congestive heart failure.   No current facility-administered medications for this encounter.  Current Outpatient Medications:    acetaminophen (TYLENOL) 500 MG tablet, Take 500 mg by mouth daily as needed (pain)., Disp: , Rfl:    albuterol (PROVENTIL) (2.5 MG/3ML) 0.083% nebulizer solution, Take 3 mLs (2.5 mg total) by nebulization every 6 (six) hours as needed for wheezing or shortness of breath. (Patient taking differently: Take 2.5 mg by nebulization as needed for wheezing or shortness of breath.), Disp: 150 mL, Rfl: 1   albuterol (VENTOLIN HFA) 108 (90 Base) MCG/ACT inhaler, Inhale into the lungs., Disp: , Rfl:    ALOE VERA JUICE PO, Take by mouth., Disp: , Rfl:    amitriptyline (ELAVIL) 10 MG tablet, TAKE 1 TABLET BY MOUTH EVERYDAY AT BEDTIME, Disp: 90 tablet, Rfl: 1   Aspirin-Acetaminophen-Caffeine (EXCEDRIN PO), Take 1 tablet by mouth as needed (headache)., Disp: , Rfl:    baclofen (LIORESAL) 10 MG tablet, Take 1 tablet (10 mg total) by mouth 3 (three) times daily as needed for muscle  spasms., Disp: 90 tablet, Rfl: 11   bismuth subsalicylate (PEPTO BISMOL) 262 MG/15ML suspension, Take 30 mLs by mouth daily as needed (acid reflux)., Disp: , Rfl:    budesonide-formoterol (SYMBICORT) 80-4.5 MCG/ACT inhaler, TAKE 2 PUFFS BY MOUTH TWICE A DAY, Disp: 10.2 each, Rfl: 3   buPROPion (WELLBUTRIN) 100 MG tablet, Take by mouth., Disp: , Rfl:    Calcium Carbonate Antacid (TUMS PO), Take 2 tablets by mouth 2 (two) times daily as needed (acid reflux)., Disp: , Rfl:    chlorthalidone (HYGROTON) 25 MG tablet, Take by mouth., Disp: , Rfl:    Cholecalciferol (D2000 ULTRA STRENGTH) 50 MCG (2000 UT) CAPS, Take 2,000 Units by mouth daily., Disp: , Rfl:    Cyanocobalamin 1000 MCG TBCR, Take by mouth., Disp: , Rfl:    DHA-EPA-Vitamin E (OMEGA-3 COMPLEX PO), Take by mouth., Disp: , Rfl:    diclofenac Sodium (VOLTAREN) 1 % GEL, Apply 4 g topically 4 (four) times daily., Disp: 400 g, Rfl: 1   DULoxetine (CYMBALTA) 60 MG capsule, Take by mouth., Disp: , Rfl:    fluticasone (FLONASE) 50 MCG/ACT nasal spray, SPRAY 2 SPRAYS INTO EACH NOSTRIL EVERY DAY, Disp: 48 mL, Rfl: 3   gabapentin (NEURONTIN) 100 MG capsule, TAKE 1 CAPSULE BY MOUTH THREE TIMES A DAY, Disp: 90 capsule, Rfl: 1   Iron, Ferrous Sulfate, 325 (65 Fe) MG TABS, 1 tab po bid, Disp: 60 tablet, Rfl: 3   meloxicam (MOBIC) 7.5 MG tablet, Take 1 tablet (7.5 mg total) by mouth daily., Disp: 30 tablet, Rfl:  2   modafinil (PROVIGIL) 200 MG tablet, Take 1 tablet (200 mg total) by mouth daily., Disp: 30 tablet, Rfl: 5   Multiple Vitamin (MULTIVITAMIN WITH MINERALS) TABS tablet, Take 1 tablet by mouth daily., Disp: , Rfl:    mupirocin ointment (BACTROBAN) 2 %, Apply 1 Application topically 2 (two) times daily., Disp: 22 g, Rfl: 2   nystatin cream (MYCOSTATIN), Apply 1 Application topically 2 (two) times daily., Disp: 30 g, Rfl: 1   OCREVUS 300 MG/10ML injection, Inject 20 mLs (600 mg total) into the vein every 6 (six) months., Disp: 20 mL, Rfl: 1    olmesartan (BENICAR) 20 MG tablet, Take by mouth., Disp: , Rfl:    olmesartan (BENICAR) 5 MG tablet, 2 tab po q day, Disp: 180 tablet, Rfl: 3   omeprazole (PRILOSEC) 20 MG capsule, Take 40 mg by mouth 2 (two) times daily before a meal., Disp: , Rfl:    ondansetron (ZOFRAN) 4 MG tablet, TAKE 1 TABLET BY MOUTH EVERY 8 HOURS AS NEEDED FOR NAUSEA AND VOMITING, Disp: 16 tablet, Rfl: 1   ondansetron (ZOFRAN-ODT) 8 MG disintegrating tablet, Take by mouth., Disp: , Rfl:    oxybutynin (DITROPAN) 5 MG tablet, Take 1 tablet (5 mg total) by mouth 2 (two) times daily., Disp: 60 tablet, Rfl: 11   RSV vaccine recomb adjuvanted (AREXVY) 120 MCG/0.5ML injection, Inject into the muscle., Disp: 0.5 mL, Rfl: 0   Thiamine HCl (THIAMINE PO), Take by mouth., Disp: , Rfl:    venlafaxine XR (EFFEXOR-XR) 37.5 MG 24 hr capsule, TAKE 1 CAPSULE BY MOUTH EVERY DAY, Disp: 90 capsule, Rfl: 1   Allergies  Allergen Reactions   Ibuprofen Other (See Comments)    Cannot take due to gastric bypass   Nsaids Other (See Comments)    Can not take due to Gastric bypass    Past Medical History:  Diagnosis Date   Alcohol abuse    Asthma    Esophagitis    GERD (gastroesophageal reflux disease)    Headache    Hearing loss    Hypertension    Multiple sclerosis (HCC)    Pancreatitis    Vision abnormalities      Past Surgical History:  Procedure Laterality Date   ANKLE FRACTURE SURGERY Bilateral    CARPAL TUNNEL RELEASE Bilateral    CHOLECYSTECTOMY     COLONOSCOPY  11/2016   multiple   ESOPHAGOGASTRODUODENOSCOPY  11/2016   ESOPHAGOGASTRODUODENOSCOPY N/A 02/01/2017   Procedure: ESOPHAGOGASTRODUODENOSCOPY (EGD);  Surgeon: Iva Boop, MD;  Location: [facility] ENDOSCOPY;  Service: Endoscopy;  Laterality: N/A;   ESOPHAGOGASTRODUODENOSCOPY (EGD) WITH PROPOFOL N/A 02/04/2017   Procedure: ESOPHAGOGASTRODUODENOSCOPY (EGD) WITH PROPOFOL;  Surgeon: Meryl Dare, MD;  Location: [facility] ENDOSCOPY;  Service: Endoscopy;  Laterality: N/A;    LAPAROSCOPIC GASTRIC SLEEVE RESECTION  2011   ULNAR NERVE REPAIR Bilateral     Family History  Problem Relation Age of Onset   Cancer Mother    Stroke Mother    Cancer Father     Social History   Tobacco Use   Smoking status: Never   Smokeless tobacco: Never   Tobacco comments:    second hand smoker when young. Heavy exposure.  Vaping Use   Vaping Use: Never used  Substance Use Topics   Alcohol use: Yes    Alcohol/week: 6.0 standard drinks of alcohol    Types: 6 Cans of beer per week    Comment: daily, couple of shots ( weekly depending on day)   Drug use:  No    ROS   Objective:   Vitals: BP (!) 167/96 (BP Location: Right Arm)   Pulse 66   Temp 98.2 F (36.8 C) (Oral)   Resp 20   LMP 08/13/2014 Comment: very few periods  SpO2 97%   Physical Exam Constitutional:      General: She is not in acute distress.    Appearance: Normal appearance. She is well-developed. She is not ill-appearing, toxic-appearing or diaphoretic.  HENT:     Head: Normocephalic and atraumatic.     Nose: Nose normal.     Mouth/Throat:     Mouth: Mucous membranes are moist.  Eyes:     General: No scleral icterus.       Right eye: No discharge.        Left eye: No discharge.     Extraocular Movements: Extraocular movements intact.     Conjunctiva/sclera: Conjunctivae normal.  Cardiovascular:     Rate and Rhythm: Normal rate and regular rhythm.     Heart sounds: Normal heart sounds. No murmur heard.    No friction rub. No gallop.  Pulmonary:     Effort: Pulmonary effort is normal. No respiratory distress.     Breath sounds: No stridor. No wheezing, rhonchi or rales.  Chest:     Chest wall: No tenderness.  Abdominal:     General: Bowel sounds are normal. There is no distension.     Palpations: Abdomen is soft. There is no mass.     Tenderness: There is no abdominal tenderness. There is no right CVA tenderness, left CVA tenderness, guarding or rebound.  Musculoskeletal:     Right  lower leg: Edema (1+ over distal calves extending to proximal feet) present.     Left lower leg: Edema (1+ over distal calves extending to proximal feet) present.  Skin:    General: Skin is warm and dry.  Neurological:     General: No focal deficit present.     Mental Status: She is alert and oriented to person, place, and time.  Psychiatric:        Mood and Affect: Mood normal.        Behavior: Behavior normal.        Thought Content: Thought content normal.        Judgment: Judgment normal.    Results for orders placed or performed during the hospital encounter of 01/30/23 (from the past 24 hour(s))  POCT urinalysis dipstick     Status: Abnormal   Collection Time: 01/30/23  1:11 PM  Result Value Ref Range   Color, UA yellow yellow   Clarity, UA cloudy (A) clear   Glucose, UA negative negative mg/dL   Bilirubin, UA negative negative   Ketones, POC UA negative negative mg/dL   Spec Grav, UA 1.610 9.604 - 1.025   Blood, UA trace-intact (A) negative   pH, UA 6.0 5.0 - 8.0   Protein Ur, POC negative negative mg/dL   Urobilinogen, UA 0.2 0.2 or 1.0 E.U./dL   Nitrite, UA Positive (A) Negative   Leukocytes, UA Large (3+) (A) Negative   Recent Results (from the past 2160 hour(s))  CBC w/Diff     Status: Abnormal   Collection Time: 11/05/22  2:52 PM  Result Value Ref Range   WBC 5.0 4.0 - 10.5 K/uL   RBC 3.55 (L) 3.87 - 5.11 Mil/uL   Hemoglobin 9.1 (L) 12.0 - 15.0 g/dL   HCT 54.0 (L) 98.1 - 19.1 %   MCV 79.5  78.0 - 100.0 fl   MCHC 32.1 30.0 - 36.0 g/dL   RDW 16.1 (H) 09.6 - 04.5 %   Platelets 386.0 150.0 - 400.0 K/uL   Neutrophils Relative % 52.4 43.0 - 77.0 %   Lymphocytes Relative 22.2 12.0 - 46.0 %   Monocytes Relative 16.9 (H) 3.0 - 12.0 %   Eosinophils Relative 7.6 (H) 0.0 - 5.0 %   Basophils Relative 0.9 0.0 - 3.0 %   Neutro Abs 2.6 1.4 - 7.7 K/uL   Lymphs Abs 1.1 0.7 - 4.0 K/uL   Monocytes Absolute 0.8 0.1 - 1.0 K/uL   Eosinophils Absolute 0.4 0.0 - 0.7 K/uL    Basophils Absolute 0.0 0.0 - 0.1 K/uL  Iron, TIBC and Ferritin Panel     Status: Abnormal   Collection Time: 11/05/22  2:52 PM  Result Value Ref Range   Iron 15 (L) 45 - 160 mcg/dL   TIBC 409 811 - 914 mcg/dL (calc)   %SAT 3 (L) 16 - 45 % (calc)   Ferritin 5 (L) 16 - 288 ng/mL  B Nat Peptide     Status: Abnormal   Collection Time: 11/05/22  2:52 PM  Result Value Ref Range   Pro B Natriuretic peptide (BNP) 115.0 (H) 0.0 - 100.0 pg/mL  Comp Met (CMET)     Status: None   Collection Time: 11/05/22  2:52 PM  Result Value Ref Range   Sodium 138 135 - 145 mEq/L   Potassium 4.4 3.5 - 5.1 mEq/L   Chloride 96 96 - 112 mEq/L   CO2 31 19 - 32 mEq/L   Glucose, Bld 91 70 - 99 mg/dL   BUN 11 6 - 23 mg/dL   Creatinine, Ser 7.82 0.40 - 1.20 mg/dL   Total Bilirubin 0.7 0.2 - 1.2 mg/dL   Alkaline Phosphatase 71 39 - 117 U/L   AST 29 0 - 37 U/L   ALT 10 0 - 35 U/L   Total Protein 6.6 6.0 - 8.3 g/dL   Albumin 3.9 3.5 - 5.2 g/dL   GFR 95.62 >13.08 mL/min    Comment: Calculated using the CKD-EPI Creatinine Equation (2021)   Calcium 9.8 8.4 - 10.5 mg/dL  M57     Status: None   Collection Time: 11/05/22  2:52 PM  Result Value Ref Range   Vitamin B-12 394 211 - 911 pg/mL  CBC with Differential (Cancer Center Only)     Status: Abnormal   Collection Time: 12/07/22  2:57 PM  Result Value Ref Range   WBC Count 10.1 4.0 - 10.5 K/uL   RBC 3.78 (L) 3.87 - 5.11 MIL/uL   Hemoglobin 9.6 (L) 12.0 - 15.0 g/dL   HCT 84.6 (L) 96.2 - 95.2 %   MCV 83.9 80.0 - 100.0 fL   MCH 25.4 (L) 26.0 - 34.0 pg   MCHC 30.3 30.0 - 36.0 g/dL   RDW 84.1 (H) 32.4 - 40.1 %   Platelet Count 428 (H) 150 - 400 K/uL   nRBC 0.0 0.0 - 0.2 %   Neutrophils Relative % 62 %   Neutro Abs 6.2 1.7 - 7.7 K/uL   Lymphocytes Relative 19 %   Lymphs Abs 1.9 0.7 - 4.0 K/uL   Monocytes Relative 15 %   Monocytes Absolute 1.6 (H) 0.1 - 1.0 K/uL   Eosinophils Relative 4 %   Eosinophils Absolute 0.4 0.0 - 0.5 K/uL   Basophils Relative 0 %    Basophils Absolute 0.0 0.0 - 0.1 K/uL  Immature Granulocytes 0 %   Abs Immature Granulocytes 0.04 0.00 - 0.07 K/uL    Comment: Performed at [facility] at [facility], [address], [place], [place] [address]  CMP (Cancer Center only)     Status: Abnormal   Collection Time: 12/07/22  2:57 PM  Result Value Ref Range   Sodium 141 135 - 145 mmol/L   Potassium 3.7 3.5 - 5.1 mmol/L   Chloride 97 (L) 98 - 111 mmol/L   CO2 35 (H) 22 - 32 mmol/L   Glucose, Bld 91 70 - 99 mg/dL    Comment: Glucose reference range applies only to samples taken after fasting for at least 8 hours.   BUN 14 8 - 23 mg/dL   Creatinine 6.04 5.40 - 1.00 mg/dL   Calcium 9.0 8.9 - 98.1 mg/dL   Total Protein 6.5 6.5 - 8.1 g/dL   Albumin 3.9 3.5 - 5.0 g/dL   AST 17 15 - 41 U/L   ALT 9 0 - 44 U/L   Alkaline Phosphatase 108 38 - 126 U/L   Total Bilirubin 0.5 0.3 - 1.2 mg/dL   GFR, Estimated >19 >14 mL/min    Comment: (NOTE) Calculated using the CKD-EPI Creatinine Equation (2021)    Anion gap 9 5 - 15    Comment: Performed at [facility] at [facility], [address] [place], [place] [address]  Ferritin     Status: None   Collection Time: 12/07/22  2:57 PM  Result Value Ref Range   Ferritin 13 11 - 307 ng/mL    Comment: Performed at [facility], [address], [place], [place] [address]  Iron and Iron Binding Capacity ([facility] only)     Status: Abnormal   Collection Time: 12/07/22  2:57 PM  Result Value Ref Range   Iron 29 28 - 170 ug/dL   TIBC 865 (H) 784 - 696 ug/dL   Saturation Ratios 6 (L) 10.4 - 31.8 %   UIBC 451 (H) 148 - 442 ug/dL    Comment: Performed at [facility], [address], [place], [place] [address]  Erythropoietin     Status: Abnormal   Collection Time: 12/07/22  2:57 PM  Result Value Ref Range   Erythropoietin 47.5 (H) 2.6 - 18.5 mIU/mL    Comment: (NOTE) [employer] UniCel DxI  800 Immunoassay System Values obtained with different assay methods or kits cannot be used interchangeably. Results cannot be interpreted as absolute evidence of the presence or absence of malignant disease. Performed At: [facility] [address] [place], [place] [id number] Jolene Schimke MD UV:[id number]   Reticulocytes     Status: Abnormal   Collection Time: 12/07/22  2:58 PM  Result Value Ref Range   Retic Ct Pct 1.8 0.4 - 3.1 %   RBC. 3.76 (L) 3.87 - 5.11 MIL/uL   Retic Count, Absolute 67.3 19.0 - 186.0 K/uL   Immature Retic Fract 9.7 2.3 - 15.9 %    Comment: Performed at [facility] at [facility], [address], [place], [place] [address]  Lactate dehydrogenase (LDH)     Status: None   Collection Time: 12/07/22  2:58 PM  Result Value Ref Range   LDH 192 98 - 192 U/L    Comment: Performed at [facility] at [facility], [address] [place], [place] [address]  CBC  with Differential (Cancer Center Only)     Status: Abnormal   Collection Time: 01/18/23 12:38 PM  Result Value Ref Range   WBC Count 8.1 4.0 - 10.5 K/uL   RBC 4.24 3.87 - 5.11 MIL/uL   Hemoglobin 12.1 12.0 - 15.0 g/dL   HCT 60.4 54.0 - 98.1 %   MCV 88.9 80.0 - 100.0 fL   MCH 28.5 26.0 - 34.0 pg   MCHC 32.1 30.0 - 36.0 g/dL   RDW 19.1 (H) 47.8 - 29.5 %   Platelet Count 269 150 - 400 K/uL   nRBC 0.0 0.0 - 0.2 %   Neutrophils Relative % 57 %   Neutro Abs 4.6 1.7 - 7.7 K/uL   Lymphocytes Relative 22 %   Lymphs Abs 1.8 0.7 - 4.0 K/uL   Monocytes Relative 11 %   Monocytes Absolute 0.9 0.1 - 1.0 K/uL   Eosinophils Relative 9 %   Eosinophils Absolute 0.7 (H) 0.0 - 0.5 K/uL   Basophils Relative 1 %   Basophils Absolute 0.1 0.0 - 0.1 K/uL   Immature Granulocytes 0 %   Abs Immature Granulocytes 0.02 0.00 - 0.07 K/uL    Comment: Performed at [facility] at [facility], [address] [place], [place] [address]  Ferritin      Status: None   Collection Time: 01/18/23 12:38 PM  Result Value Ref Range   Ferritin 132 11 - 307 ng/mL    Comment: Performed at [facility], [address], [place], [place] [address]  Iron and Iron Binding Capacity ([facility] only)     Status: Abnormal   Collection Time: 01/18/23 12:38 PM  Result Value Ref Range   Iron 155 28 - 170 ug/dL   TIBC 469 629 - 528 ug/dL   Saturation Ratios 50 (H) 10.4 - 31.8 %   UIBC 157 148 - 442 ug/dL    Comment: Performed at [facility], [address], [place], [place] [address]  Reticulocytes     Status: None   Collection Time: 01/18/23 12:38 PM  Result Value Ref Range   Retic Ct Pct 1.1 0.4 - 3.1 %   RBC. 4.19 3.87 - 5.11 MIL/uL   Retic Count, Absolute 47.8 19.0 - 186.0 K/uL   Immature Retic Fract 8.0 2.3 - 15.9 %    Comment: Performed at [facility] at [facility], [address] [place], [place] [address]  POCT urinalysis dipstick     Status: Abnormal   Collection Time: 01/30/23  1:11 PM  Result Value Ref Range   Color, UA yellow yellow   Clarity, UA cloudy (A) clear   Glucose, UA negative negative mg/dL   Bilirubin, UA negative negative   Ketones, POC UA negative negative mg/dL   Spec Grav, UA 7.253 6.644 - 1.025   Blood, UA trace-intact (A) negative   pH, UA 6.0 5.0 - 8.0   Protein Ur, POC negative negative mg/dL   Urobilinogen, UA 0.2 0.2 or 1.0 E.U./dL   Nitrite, UA Positive (A) Negative   Leukocytes, UA Large (3+) (A) Negative     Assessment and Plan :   PDMP not reviewed this encounter.  1. Acute cystitis with hematuria   2. Dysuria   3. Alcohol abuse   4. Peripheral edema     Start Keflex to cover for acute cystitis, urine culture pending.  Recommended aggressive hydration, limiting urinary irritants.  Discussed need to recheck  with PCP and seek help with the alcohol abuse.  Suspect the peripheral edema is related to lack of physical activity  and her drinking.  Recheck with PCP.  Otherwise, patient has a clear cardiopulmonary exam.  Low suspicion for CHF, AKI.  Counseled patient on potential for adverse effects with medications prescribed/recommended today, ER and return-to-clinic precautions discussed, patient verbalized understanding.     Wallis Bamberg, PA-C 01/30/23 1320

## 2023-01-30 NOTE — ED Triage Notes (Signed)
Pt c/o dysuria, freq, foul smelling urine x 1 month-also c/o bilat LE swelling x 3 weeks-NAD-steady gait

## 2023-01-30 NOTE — ED Notes (Signed)
Waiting for urine sample-pt drinking water

## 2023-02-01 LAB — URINE CULTURE: Culture: >=100000 — AB

## 2023-02-14 ENCOUNTER — Encounter: Payer: Federal, State, Local not specified - PPO | Admitting: Physical Medicine and Rehabilitation

## 2023-03-13 ENCOUNTER — Encounter: Payer: Self-pay | Admitting: Sports Medicine

## 2023-03-13 ENCOUNTER — Ambulatory Visit
Admission: RE | Admit: 2023-03-13 | Discharge: 2023-03-13 | Disposition: A | Discharge status: Home / Self Care | Payer: Federal, State, Local not specified - PPO | Source: Ambulatory Visit | Attending: Sports Medicine | Admitting: Sports Medicine

## 2023-03-13 ENCOUNTER — Ambulatory Visit: Payer: Federal, State, Local not specified - PPO | Admitting: Sports Medicine

## 2023-03-13 VITALS — BP 176/102 | Ht 64.0 in | Wt 165.0 lb

## 2023-03-13 DIAGNOSIS — M25562 Pain in left knee: Secondary | ICD-10-CM | POA: Diagnosis not present

## 2023-03-13 DIAGNOSIS — M25561 Pain in right knee: Secondary | ICD-10-CM | POA: Diagnosis not present

## 2023-03-13 DIAGNOSIS — M170 Bilateral primary osteoarthritis of knee: Secondary | ICD-10-CM

## 2023-03-13 MED ORDER — METHYLPREDNISOLONE ACETATE 40 MG/ML IJ SUSP
40.0000 mg | Freq: Once | INTRAMUSCULAR | Status: AC
Start: 2023-03-13 — End: 2023-03-13
  Administered 2023-03-13: 40 mg via INTRA_ARTICULAR

## 2023-03-13 NOTE — Assessment & Plan Note (Addendum)
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  She has not had any recent falls or trauma.  She is not consistently tried 1 therapy or multiple therapies at the same time. -Previous x-rays show some narrowing of the joint space and minor osteophyte formation but these were nonweightbearing. - We will order repeat x-rays to include weightbearing view.  I suspect her arthritis is worse than shown on previous imaging. - Cortisone injection bilaterally today.  Consent obtained.  No complications.  Patient tolerated well. - We will set her up with physical therapy. - Discussed multifaceted use of ice, Voltaren gel, scheduled Tylenol, and as needed NSAIDs sparingly. - Will follow-up with her in 1 month.

## 2023-03-13 NOTE — Progress Notes (Signed)
Catherine Hubbard - 62 y.o. female MRN [id number]  Date of birth: May 07, 1961  PCP: [name], PA-C  Subjective:  No chief complaint on file.  Bilateral knee pain  HPI: Past Medical, Surgical, Social, and Family History Reviewed & Updated per EMR.   Patient is a 62 y.o. female here for follow-up on bilateral knee pain.  She received benefit from her previous steroid injections in April.  She has been mobile at home with a walker and has not had any falls or direct trauma to the knees.  Her pain relief lasted up until 1 week ago when her left greater than right knees started aching and interfering with her daily activities.  She has tried different topical formulations, 1 at a time, without combination as well as intermittent ibuprofen, and Tylenol use when the pain is unbearable.  She has not been icing her knees.  She has not done any previous physical therapy specifically for her knees.  She denies any infections, knee warmth, edema, fevers, chills.  Past Medical History:  Diagnosis Date   Alcohol abuse    Asthma    Esophagitis    GERD (gastroesophageal reflux disease)    Headache    Hearing loss    Hypertension    Multiple sclerosis (HCC)    Pancreatitis    Vision abnormalities     Current Outpatient Medications on File Prior to Visit  Medication Sig Dispense Refill   acetaminophen (TYLENOL) 500 MG tablet Take 500 mg by mouth daily as needed (pain).     albuterol (PROVENTIL) (2.5 MG/3ML) 0.083% nebulizer solution Take 3 mLs (2.5 mg total) by nebulization every 6 (six) hours as needed for wheezing or shortness of breath. (Patient taking differently: Take 2.5 mg by nebulization as needed for wheezing or shortness of breath.) 150 mL 1   albuterol (VENTOLIN HFA) 108 (90 Base) MCG/ACT inhaler Inhale into the lungs.     ALOE VERA JUICE PO Take by mouth.     amitriptyline (ELAVIL) 10 MG tablet TAKE 1 TABLET BY MOUTH EVERYDAY AT BEDTIME 90 tablet 1    Aspirin-Acetaminophen-Caffeine (EXCEDRIN PO) Take 1 tablet by mouth as needed (headache).     baclofen (LIORESAL) 10 MG tablet Take 1 tablet (10 mg total) by mouth 3 (three) times daily as needed for muscle spasms. 90 tablet 11   bismuth subsalicylate (PEPTO BISMOL) 262 MG/15ML suspension Take 30 mLs by mouth daily as needed (acid reflux).     budesonide-formoterol (SYMBICORT) 80-4.5 MCG/ACT inhaler TAKE 2 PUFFS BY MOUTH TWICE A DAY 10.2 each 3   buPROPion (WELLBUTRIN) 100 MG tablet Take by mouth.     Calcium Carbonate Antacid (TUMS PO) Take 2 tablets by mouth 2 (two) times daily as needed (acid reflux).     cephALEXin (KEFLEX) 500 MG capsule Take 1 capsule (500 mg total) by mouth 3 (three) times daily. 21 capsule 0   chlorthalidone (HYGROTON) 25 MG tablet Take by mouth.     Cholecalciferol (D2000 ULTRA STRENGTH) 50 MCG (2000 UT) CAPS Take 2,000 Units by mouth daily.     Cyanocobalamin 1000 MCG TBCR Take by mouth.     DHA-EPA-Vitamin E (OMEGA-3 COMPLEX PO) Take by mouth.     diclofenac Sodium (VOLTAREN) 1 % GEL Apply 4 g topically 4 (four) times daily. 400 g 1   DULoxetine (CYMBALTA) 60 MG capsule Take by mouth.     fluticasone (FLONASE) 50 MCG/ACT nasal spray SPRAY 2 SPRAYS INTO EACH NOSTRIL EVERY DAY 48 mL 3  gabapentin (NEURONTIN) 100 MG capsule TAKE 1 CAPSULE BY MOUTH THREE TIMES A DAY 90 capsule 1   Iron, Ferrous Sulfate, 325 (65 Fe) MG TABS 1 tab po bid 60 tablet 3   meloxicam (MOBIC) 7.5 MG tablet Take 1 tablet (7.5 mg total) by mouth daily. 30 tablet 2   modafinil (PROVIGIL) 200 MG tablet Take 1 tablet (200 mg total) by mouth daily. 30 tablet 5   Multiple Vitamin (MULTIVITAMIN WITH MINERALS) TABS tablet Take 1 tablet by mouth daily.     mupirocin ointment (BACTROBAN) 2 % Apply 1 Application topically 2 (two) times daily. 22 g 2   nystatin cream (MYCOSTATIN) Apply 1 Application topically 2 (two) times daily. 30 g 1   OCREVUS 300 MG/10ML injection Inject 20 mLs (600 mg total) into the  vein every 6 (six) months. 20 mL 1   olmesartan (BENICAR) 20 MG tablet Take by mouth.     olmesartan (BENICAR) 5 MG tablet 2 tab po q day 180 tablet 3   omeprazole (PRILOSEC) 20 MG capsule Take 40 mg by mouth 2 (two) times daily before a meal.     ondansetron (ZOFRAN) 4 MG tablet TAKE 1 TABLET BY MOUTH EVERY 8 HOURS AS NEEDED FOR NAUSEA AND VOMITING 16 tablet 1   ondansetron (ZOFRAN-ODT) 8 MG disintegrating tablet Take by mouth.     oxybutynin (DITROPAN) 5 MG tablet Take 1 tablet (5 mg total) by mouth 2 (two) times daily. 60 tablet 11   RSV vaccine recomb adjuvanted (AREXVY) 120 MCG/0.5ML injection Inject into the muscle. 0.5 mL 0   Thiamine HCl (THIAMINE PO) Take by mouth.     venlafaxine XR (EFFEXOR-XR) 37.5 MG 24 hr capsule TAKE 1 CAPSULE BY MOUTH EVERY DAY 90 capsule 1   No current facility-administered medications on file prior to visit.    Past Surgical History:  Procedure Laterality Date   ANKLE FRACTURE SURGERY Bilateral    CARPAL TUNNEL RELEASE Bilateral    CHOLECYSTECTOMY     COLONOSCOPY  11/2016   multiple   ESOPHAGOGASTRODUODENOSCOPY  11/2016   ESOPHAGOGASTRODUODENOSCOPY N/A 02/01/2017   Procedure: ESOPHAGOGASTRODUODENOSCOPY (EGD);  Surgeon: Iva Boop, MD;  Location: [facility] ENDOSCOPY;  Service: Endoscopy;  Laterality: N/A;   ESOPHAGOGASTRODUODENOSCOPY (EGD) WITH PROPOFOL N/A 02/04/2017   Procedure: ESOPHAGOGASTRODUODENOSCOPY (EGD) WITH PROPOFOL;  Surgeon: Meryl Dare, MD;  Location: [facility] ENDOSCOPY;  Service: Endoscopy;  Laterality: N/A;   LAPAROSCOPIC GASTRIC SLEEVE RESECTION  2011   ULNAR NERVE REPAIR Bilateral     Allergies  Allergen Reactions   Ibuprofen Other (See Comments)    Cannot take due to gastric bypass   Nsaids Other (See Comments)    Can not take due to Gastric bypass        Objective:  Physical Exam: VS: BP:(!) 154/85  HR: bpm  TEMP: ( )  RESP:   HT:5\' 4"  (162.6 cm)   WT:165 lb (74.8 kg)  BMI:28.31  Gen: NAD, comfortable in exam room MSK:  Gait is slow and antalgic with walker. Respiratory: normal work of breathing on room air Skin: No rashes, abrasions, or ecchymosis Bilateral Knees: Inspection with chronic arthritic changes and marked atrophy of the quadriceps muscles, no erythema or effusion. ROM full in flexion and extension with crepitus in both directions and pain. Palpation w/ out warmth, no effusion present, no posterior tenderness Bilateral medial and lateral joint line tenderness to palpation  No tenderness over Gerdy's tubercle, pes bursa, or tibial tuberosity No patellar tenderness Patellar and quadriceps tendons unremarkable.  Hamstring and quadriceps strength is 4/5.   Procedure:  Risks and benefits of procedure were discussed with patient. After informed written consent timeout was performed, patient was seated on exam table.  Lateral knees were prepped with alcohol swab and utilizing lateral approach, patient's right knee was injected intraarticularly with 3:1 lidocaine:depomedrol x2. Patient tolerated the procedure well without immediate complications.  Bandages were placed.   Assessment & Plan:   Primary osteoarthritis of both knees - Malena Edman has marked atrophy of her bilateral quads, and is using a walker to get around.  She has not had any recent falls or trauma.  She is not consistently tried 1 therapy or multiple therapies at the same time. - We will order repeat x-rays to include weightbearing view.  I suspect her arthritis is worse than shown on previous imaging. - Cortisone injection bilaterally today.  Consent obtained.  No complications.  Patient tolerated well. - We will set her up with physical therapy. - Discussed multifaceted use of ice, Voltaren gel, scheduled Tylenol, and as needed NSAIDs sparingly. - Will follow-up with her in 1 month.    Rica Mote MD [facility] Sports Medicine Fellow   Patient seen and evaluated with the sports medicine fellow.  I agree with the above plan of  care.  We will get standing knee x-rays to evaluate degree of osteoarthritis present.  Knees are reinjected today with cortisone.  Follow-up in 4 weeks.  This is her second set of cortisone injections over the past 3 months and if she gets only limited benefit from today's injection then consider Visco supplementation.

## 2023-03-18 ENCOUNTER — Telehealth: Payer: Self-pay

## 2023-03-18 NOTE — Telephone Encounter (Signed)
-----   Message from [facility] sent at 03/14/2023  5:35 AM EDT ----- Pt had very high bp at specialist office. Can you get her in with someone to recheck this week or next week. If she is symptomatic advise emergency dept evaluation. ----- Message ----- From: Ralene Cork, DO Sent: 03/13/2023   2:33 PM EDT To: [name], PA-C

## 2023-03-18 NOTE — Telephone Encounter (Signed)
Pt called to schedule NV BP check -- lvm to return call and schedule

## 2023-03-25 ENCOUNTER — Other Ambulatory Visit: Payer: Self-pay | Admitting: Physical Medicine and Rehabilitation

## 2023-03-27 MED FILL — Bupivacaine HCl Preservative Free (PF) Inj 0.5%: INTRAMUSCULAR | Qty: 6 | Status: AC

## 2023-04-09 ENCOUNTER — Ambulatory Visit: Payer: Federal, State, Local not specified - PPO | Admitting: Sports Medicine

## 2023-04-10 ENCOUNTER — Encounter: Payer: Self-pay | Admitting: Sports Medicine

## 2023-04-12 ENCOUNTER — Encounter: Payer: Self-pay | Admitting: Physical Medicine and Rehabilitation

## 2023-04-15 ENCOUNTER — Other Ambulatory Visit (HOSPITAL_COMMUNITY): Payer: Self-pay

## 2023-04-15 ENCOUNTER — Other Ambulatory Visit: Payer: Self-pay | Admitting: *Deleted

## 2023-04-15 ENCOUNTER — Other Ambulatory Visit (HOSPITAL_BASED_OUTPATIENT_CLINIC_OR_DEPARTMENT_OTHER): Payer: Self-pay

## 2023-04-15 ENCOUNTER — Ambulatory Visit (INDEPENDENT_AMBULATORY_CARE_PROVIDER_SITE_OTHER): Payer: Federal, State, Local not specified - PPO | Admitting: Medical

## 2023-04-15 ENCOUNTER — Encounter: Payer: Self-pay | Admitting: Medical

## 2023-04-15 ENCOUNTER — Ambulatory Visit (HOSPITAL_BASED_OUTPATIENT_CLINIC_OR_DEPARTMENT_OTHER)
Admission: RE | Admit: 2023-04-15 | Discharge: 2023-04-15 | Disposition: A | Discharge status: Home / Self Care | Payer: Federal, State, Local not specified - PPO | Source: Ambulatory Visit | Attending: Medical | Admitting: Medical

## 2023-04-15 VITALS — BP 159/95 | HR 80 | Temp 99.3°F | Resp 18 | Ht 64.0 in | Wt 172.8 lb

## 2023-04-15 DIAGNOSIS — R300 Dysuria: Secondary | ICD-10-CM

## 2023-04-15 DIAGNOSIS — I10 Essential (primary) hypertension: Secondary | ICD-10-CM

## 2023-04-15 DIAGNOSIS — N390 Urinary tract infection, site not specified: Secondary | ICD-10-CM | POA: Diagnosis not present

## 2023-04-15 DIAGNOSIS — M546 Pain in thoracic spine: Secondary | ICD-10-CM | POA: Diagnosis not present

## 2023-04-15 DIAGNOSIS — R600 Localized edema: Secondary | ICD-10-CM | POA: Insufficient documentation

## 2023-04-15 DIAGNOSIS — R7989 Other specified abnormal findings of blood chemistry: Secondary | ICD-10-CM

## 2023-04-15 DIAGNOSIS — Z79899 Other long term (current) drug therapy: Secondary | ICD-10-CM

## 2023-04-15 DIAGNOSIS — G35 Multiple sclerosis: Secondary | ICD-10-CM

## 2023-04-15 LAB — POCT URINALYSIS DIPSTICK
.: 0.2
Bilirubin, UA: NEGATIVE
Blood, UA: NEGATIVE
Glucose, UA: NEGATIVE
Ketones, UA: NEGATIVE
Nitrite, UA: POSITIVE
Protein, UA: NEGATIVE
Spec Grav, UA: <=1.005 — AB (ref 1.010–1.025)
pH, UA: 6.5 (ref 5.0–8.0)

## 2023-04-15 MED ORDER — CEPHALEXIN 500 MG PO CAPS
500.0000 mg | ORAL_CAPSULE | Freq: Two times a day (BID) | ORAL | 0 refills | Status: DC
  Filled 2023-04-15: qty 20, 10d supply, fill #0

## 2023-04-15 MED ORDER — CEPHALEXIN 500 MG PO CAPS: 500.0000 mg | ORAL_CAPSULE | Freq: Two times a day (BID) | ORAL | 0 refills | Status: DC

## 2023-04-15 NOTE — Progress Notes (Signed)
Subjective:    Patient ID: Catherine Hubbard, female    DOB: 1961-05-03, 62 y.o.   MRN: [id number]  HPI   Discussed the use of AI scribe software for clinical note transcription with the patient, who gave verbal consent to proceed.  History of Present Illness   The patient, with a history of recurrent urinary tract infections, presents with symptoms suggestive of another UTI. She reports experiencing intermittent burning, itching, and an unusual odor in her urine for over a month. She denies any foul smell. The patient has a history of E. Coli infection two months ago, treated with Keflex, after which she felt better but not completely resolved. She has been self-medicating with Azo Standard for pain relief.  The patient also reports upper back pain, initially on the left side, which has now spread to the mid-thoracic area and is starting to appear on the right side. The pain started a few weeks ago and has been managed with diclofenac/voltaren geland a TENS unit. upcoming appt pain management.  Additionally, the patient has been experiencing chronic intermittent pedal edema, which she has been managing with an over-the-counter diuretic short course but now finished. She denies any associated shortness of breath, except in high humidity conditions, which she manages with an asthma inhaler and Symbicort. She has no difficulty sleeping flat on her back. She has a history of slightly elevated BNP levels and is due for a follow-up with cardiology and a repeat echocardiogram.       Review of Systems  Constitutional:  Negative for chills, fatigue and fever.  Respiratory:  Negative for cough, chest tightness, shortness of breath and wheezing.        See hpi.  Cardiovascular:  Negative for chest pain and palpitations.  Gastrointestinal:  Negative for abdominal pain.  Genitourinary:  Positive for dysuria and frequency.       See hpi.  Musculoskeletal:  Negative for back pain and neck pain.   Skin:  Negative for rash.  Neurological:  Negative for dizziness, speech difficulty, weakness, light-headedness and numbness.  Hematological:  Negative for adenopathy.  Psychiatric/Behavioral:  Negative for behavioral problems and dysphoric mood. The patient is not nervous/anxious.     Past Medical History:  Diagnosis Date   Alcohol abuse    Asthma    Esophagitis    GERD (gastroesophageal reflux disease)    Headache    Hearing loss    Hypertension    Multiple sclerosis (HCC)    Pancreatitis    Vision abnormalities      Social History   Socioeconomic History   Marital status: Married    Spouse name: Not on file   Number of children: Not on file   Years of education: Not on file   Highest education level: Not on file  Occupational History   Not on file  Tobacco Use   Smoking status: Never   Smokeless tobacco: Never   Tobacco comments:    second hand smoker when young. Heavy exposure.  Vaping Use   Vaping status: Never Used  Substance and Sexual Activity   Alcohol use: Yes    Alcohol/week: 6.0 standard drinks of alcohol    Types: 6 Cans of beer per week    Comment: daily, couple of shots ( weekly depending on day)   Drug use: No   Sexual activity: Not Currently  Other Topics Concern   Not on file  Social History Narrative   Not on file   Social Determinants of  Health   Financial Resource Strain: Not on file  Food Insecurity: Not on file  Transportation Needs: Not on file  Physical Activity: Not on file  Stress: Not on file  Social Connections: Not on file  Intimate Partner Violence: Not on file    Past Surgical History:  Procedure Laterality Date   ANKLE FRACTURE SURGERY Bilateral    CARPAL TUNNEL RELEASE Bilateral    CHOLECYSTECTOMY     COLONOSCOPY  11/2016   multiple   ESOPHAGOGASTRODUODENOSCOPY  11/2016   ESOPHAGOGASTRODUODENOSCOPY N/A 02/01/2017   Procedure: ESOPHAGOGASTRODUODENOSCOPY (EGD);  Surgeon: Iva Boop, MD;  Location: [facility] ENDOSCOPY;   Service: Endoscopy;  Laterality: N/A;   ESOPHAGOGASTRODUODENOSCOPY (EGD) WITH PROPOFOL N/A 02/04/2017   Procedure: ESOPHAGOGASTRODUODENOSCOPY (EGD) WITH PROPOFOL;  Surgeon: Meryl Dare, MD;  Location: [facility] ENDOSCOPY;  Service: Endoscopy;  Laterality: N/A;   LAPAROSCOPIC GASTRIC SLEEVE RESECTION  2011   ULNAR NERVE REPAIR Bilateral     Family History  Problem Relation Age of Onset   Cancer Mother    Stroke Mother    Cancer Father     Allergies  Allergen Reactions   Ibuprofen Other (See Comments)    Cannot take due to gastric bypass   Nsaids Other (See Comments)    Can not take due to Gastric bypass    Current Outpatient Medications on File Prior to Visit  Medication Sig Dispense Refill   acetaminophen (TYLENOL) 500 MG tablet Take 500 mg by mouth daily as needed (pain).     albuterol (PROVENTIL) (2.5 MG/3ML) 0.083% nebulizer solution Take 3 mLs (2.5 mg total) by nebulization every 6 (six) hours as needed for wheezing or shortness of breath. (Patient taking differently: Take 2.5 mg by nebulization as needed for wheezing or shortness of breath.) 150 mL 1   albuterol (VENTOLIN HFA) 108 (90 Base) MCG/ACT inhaler Inhale into the lungs.     ALOE VERA JUICE PO Take by mouth.     amitriptyline (ELAVIL) 10 MG tablet TAKE 1 TABLET BY MOUTH EVERYDAY AT BEDTIME 90 tablet 1   Aspirin-Acetaminophen-Caffeine (EXCEDRIN PO) Take 1 tablet by mouth as needed (headache).     baclofen (LIORESAL) 10 MG tablet Take 1 tablet (10 mg total) by mouth 3 (three) times daily as needed for muscle spasms. 90 tablet 11   bismuth subsalicylate (PEPTO BISMOL) 262 MG/15ML suspension Take 30 mLs by mouth daily as needed (acid reflux).     budesonide-formoterol (SYMBICORT) 80-4.5 MCG/ACT inhaler TAKE 2 PUFFS BY MOUTH TWICE A DAY 10.2 each 3   Calcium Carbonate Antacid (TUMS PO) Take 2 tablets by mouth 2 (two) times daily as needed (acid reflux).     Cholecalciferol (D2000 ULTRA STRENGTH) 50 MCG (2000 UT) CAPS Take  2,000 Units by mouth daily.     Cyanocobalamin 1000 MCG TBCR Take by mouth.     diclofenac Sodium (VOLTAREN) 1 % GEL Apply 4 g topically 4 (four) times daily. 400 g 1   fluticasone (FLONASE) 50 MCG/ACT nasal spray SPRAY 2 SPRAYS INTO EACH NOSTRIL EVERY DAY 48 mL 3   gabapentin (NEURONTIN) 100 MG capsule TAKE 1 CAPSULE BY MOUTH THREE TIMES A DAY 90 capsule 1   Iron, Ferrous Sulfate, 325 (65 Fe) MG TABS 1 tab po bid 60 tablet 3   meloxicam (MOBIC) 7.5 MG tablet Take 1 tablet (7.5 mg total) by mouth daily. 30 tablet 2   modafinil (PROVIGIL) 200 MG tablet Take 1 tablet (200 mg total) by mouth daily. 30 tablet 5   Multiple  Vitamin (MULTIVITAMIN WITH MINERALS) TABS tablet Take 1 tablet by mouth daily.     nystatin cream (MYCOSTATIN) Apply 1 Application topically 2 (two) times daily. 30 g 1   OCREVUS 300 MG/10ML injection Inject 20 mLs (600 mg total) into the vein every 6 (six) months. 20 mL 1   omeprazole (PRILOSEC) 20 MG capsule Take 40 mg by mouth 2 (two) times daily before a meal.     ondansetron (ZOFRAN) 4 MG tablet TAKE 1 TABLET BY MOUTH EVERY 8 HOURS AS NEEDED FOR NAUSEA AND VOMITING 16 tablet 1   ondansetron (ZOFRAN-ODT) 8 MG disintegrating tablet Take by mouth.     oxybutynin (DITROPAN) 5 MG tablet Take 1 tablet (5 mg total) by mouth 2 (two) times daily. 60 tablet 11   buPROPion (WELLBUTRIN) 100 MG tablet Take by mouth. (Patient not taking: Reported on 04/15/2023)     chlorthalidone (HYGROTON) 25 MG tablet Take by mouth. (Patient not taking: Reported on 04/15/2023)     DHA-EPA-Vitamin E (OMEGA-3 COMPLEX PO) Take by mouth. (Patient not taking: Reported on 04/15/2023)     DULoxetine (CYMBALTA) 60 MG capsule Take by mouth. (Patient not taking: Reported on 04/15/2023)     mupirocin ointment (BACTROBAN) 2 % Apply 1 Application topically 2 (two) times daily. (Patient not taking: Reported on 04/15/2023) 22 g 2   olmesartan (BENICAR) 5 MG tablet 2 tab po q day (Patient not taking: Reported on 04/15/2023) 180  tablet 3   RSV vaccine recomb adjuvanted (AREXVY) 120 MCG/0.5ML injection Inject into the muscle. (Patient not taking: Reported on 04/15/2023) 0.5 mL 0   Thiamine HCl (THIAMINE PO) Take by mouth. (Patient not taking: Reported on 04/15/2023)     venlafaxine XR (EFFEXOR-XR) 37.5 MG 24 hr capsule TAKE 1 CAPSULE BY MOUTH EVERY DAY (Patient not taking: Reported on 04/15/2023) 90 capsule 1   No current facility-administered medications on file prior to visit.    BP (!) 159/95 (BP Location: Left Arm, Patient Position: Sitting, Cuff Size: Large)   Pulse 80   Temp 99.3 F (37.4 C) (Oral)   Resp 18   Ht 5\' 4"  (1.626 m)   Wt 172 lb 12.8 oz (78.4 kg)   LMP 08/13/2014 Comment: very few periods  SpO2 98%   BMI 29.66 kg/m     .     Objective:   Physical Exam  General Mental Status- Alert. General Appearance- Not in acute distress.    Skin General: Color- Normal Color. Moisture- Normal Moisture.   Neck Carotid Arteries- Normal color. Moisture- Normal Moisture. No carotid bruits. No JVD.   Chest and Lung Exam Auscultation: Breath Sounds:-Normal.   Cardiovascular Auscultation:Rythm- Regular. Murmurs & Other Heart Sounds:Auscultation of the heart reveals- No Murmurs.   Abdomen Inspection:-Inspeection Normal. Palpation/Percussion:Note:No mass. Palpation and Percussion of the abdomen reveal- Non Tender, Non Distended + BS, no rebound or guarding.     Neurologic Cranial Nerve exam:- CN III-XII intact(No nystagmus), symmetric smile. Strength:- 5/5 equal and symmetric strength both upper and lower extremities.      Lower ext- lower extremity 1+ symmetric pedal edema presently. Negative homans signs.      Assessment & Plan:  Assessment and Plan    Urinary Tract Infection Recurrent symptoms for over a month with a history of E. Coli UTI two months ago. Urinalysis showed large leukocytes and nitrites. -Send urine for culture. -Prescribe Keflex 500mg  BID for 10 days, pending culture  results.  Upper Back Pain Pain in the left upper back near the  lower trapezius area, which has moved to the mid-thoracic spine. Patient has an upcoming appointment with pain management. -Advise patient to follow up with pain management. -If pain worsens before the appointment, patient should contact the office.  Chronic Intermittent Pedal Edema Symmetric lower extremity swelling, possibly dependent edema. History of mildly elevated BNP. Last echocardiogram was four years ago. -Order metabolic panel, BNP, and chest x-ray. -Refer to cardiology for repeat echocardiogram. -Depending on results, consider low dose Lasix or hydrochlorothiazide.       Htn- Bp is high today. I want you to take extra benicar 10 mg dose when you get home. - by tomorrow will review labs and make decision on diuretic.  Follow up in date to be determined after lab review. Ask you send me my chart update on bp readings in one week.   Time spent with patient today was  45 minutes which consisted of chart revdiew, discussing diagnosis, work up treatment and documentation.   [name], PA-C

## 2023-04-15 NOTE — Patient Instructions (Addendum)
Urinary Tract Infection Recurrent symptoms for over a month with a history of E. Coli UTI two months ago. Urinalysis showed large leukocytes and nitrites. -Send urine for culture. -Prescribe Keflex 500mg  BID for 10 days, pending culture results.  Upper Back Pain Pain in the left upper back near the lower trapezius area, which has moved to the mid-thoracic spine. Patient has an upcoming appointment with pain management. -Advise patient to follow up with pain management. -If pain worsens before the appointment, patient should contact the office.  Chronic Intermittent Pedal Edema Symmetric lower extremity swelling, possibly dependent edema. History of mildly elevated BNP. Last echocardiogram was four years ago. -Order metabolic panel, BNP, and chest x-ray. -Refer to cardiology for repeat echocardiogram. -Depending on results, consider low dose Lasix or hydrochlorothiazide.  Htn- Bp is high today. I want you to take extra benicar 10 mg dose when you get home. - by tomorrow will review labs and make decision on diuretic.  Follow up in date to be determined after lab review. Ask you send me my chart update on bp readings in one week.

## 2023-04-16 ENCOUNTER — Telehealth: Payer: Self-pay | Admitting: Medical

## 2023-04-16 ENCOUNTER — Other Ambulatory Visit: Payer: Self-pay | Admitting: Neurology

## 2023-04-16 DIAGNOSIS — N390 Urinary tract infection, site not specified: Secondary | ICD-10-CM

## 2023-04-16 DIAGNOSIS — G35 Multiple sclerosis: Secondary | ICD-10-CM

## 2023-04-16 LAB — COMPREHENSIVE METABOLIC PANEL
ALT: 11 U/L (ref 0–35)
AST: 30 U/L (ref 0–37)
Albumin: 4 g/dL (ref 3.5–5.2)
Alkaline Phosphatase: 76 U/L (ref 39–117)
BUN: 8 mg/dL (ref 6–23)
CO2: 33 mEq/L — ABNORMAL HIGH (ref 19–32)
Calcium: 9.8 mg/dL (ref 8.4–10.5)
Chloride: 97 mEq/L (ref 96–112)
Creatinine, Ser: 0.69 mg/dL (ref 0.40–1.20)
GFR: 92.76 mL/min (ref >60.00)
Glucose, Bld: 95 mg/dL (ref 70–99)
Potassium: 4.6 meq/L (ref 3.5–5.1)
Sodium: 139 mEq/L (ref 135–145)
Total Bilirubin: 1.2 mg/dL (ref 0.2–1.2)
Total Protein: 6.6 g/dL (ref 6.0–8.3)

## 2023-04-16 LAB — BRAIN NATRIURETIC PEPTIDE: Pro B Natriuretic peptide (BNP): 225 pg/mL — ABNORMAL HIGH (ref 0.0–100.0)

## 2023-04-16 MED ORDER — CEPHALEXIN 500 MG PO CAPS: 500.0000 mg | ORAL_CAPSULE | Freq: Two times a day (BID) | ORAL | 0 refills | Status: DC

## 2023-04-16 NOTE — Addendum Note (Signed)
Addended by: Maximino Sarin on: 04/16/2023 04:03 PM   Modules accepted: Orders

## 2023-04-16 NOTE — Telephone Encounter (Signed)
Per PCP VO sent in Keflex 500 mg BID x 10 days to pharmacy  [facility]

## 2023-04-16 NOTE — Telephone Encounter (Signed)
Pt wants medication sent to [facility] on [address]

## 2023-04-16 NOTE — Telephone Encounter (Signed)
Pt said that her prescription for keflex was cancelled at both pharmacies. Please send prescription to [facility]. Please advise why prescriptions were canceled and if she needs something else

## 2023-04-16 NOTE — Telephone Encounter (Signed)
Pt called and lvm to return call in regards to correct pharmacy

## 2023-04-16 NOTE — Telephone Encounter (Signed)
Last seen on 11/01/22 Follow up scheduled on 05/20/23

## 2023-04-17 ENCOUNTER — Encounter: Payer: Self-pay | Admitting: Medical

## 2023-04-17 ENCOUNTER — Encounter: Payer: Self-pay | Admitting: *Deleted

## 2023-04-17 LAB — URINE CULTURE
MICRO NUMBER:: 15381129
SPECIMEN QUALITY:: ADEQUATE

## 2023-04-18 ENCOUNTER — Other Ambulatory Visit: Payer: Self-pay | Admitting: Physical Medicine and Rehabilitation

## 2023-04-18 ENCOUNTER — Ambulatory Visit (HOSPITAL_BASED_OUTPATIENT_CLINIC_OR_DEPARTMENT_OTHER)
Admission: RE | Admit: 2023-04-18 | Discharge: 2023-04-18 | Disposition: A | Discharge status: Home / Self Care | Payer: Federal, State, Local not specified - PPO | Source: Ambulatory Visit | Attending: Medical | Admitting: Medical

## 2023-04-18 ENCOUNTER — Encounter (HOSPITAL_BASED_OUTPATIENT_CLINIC_OR_DEPARTMENT_OTHER): Payer: Self-pay

## 2023-04-18 ENCOUNTER — Other Ambulatory Visit (INDEPENDENT_AMBULATORY_CARE_PROVIDER_SITE_OTHER): Payer: Self-pay

## 2023-04-18 DIAGNOSIS — Z1231 Encounter for screening mammogram for malignant neoplasm of breast: Secondary | ICD-10-CM | POA: Insufficient documentation

## 2023-04-18 DIAGNOSIS — Z79899 Other long term (current) drug therapy: Secondary | ICD-10-CM | POA: Diagnosis not present

## 2023-04-18 DIAGNOSIS — G35 Multiple sclerosis: Secondary | ICD-10-CM

## 2023-04-18 DIAGNOSIS — Z0289 Encounter for other administrative examinations: Secondary | ICD-10-CM

## 2023-04-19 ENCOUNTER — Inpatient Hospital Stay: Payer: Federal, State, Local not specified - PPO

## 2023-04-19 ENCOUNTER — Inpatient Hospital Stay: Payer: Federal, State, Local not specified - PPO | Admitting: Family

## 2023-04-19 LAB — IGG, IGA, IGM
IgA/Immunoglobulin A, Serum: 238 mg/dL (ref 87–352)
IgG (Immunoglobin G), Serum: 825 mg/dL (ref 586–1602)
IgM (Immunoglobulin M), Srm: 155 mg/dL (ref 26–217)

## 2023-04-23 ENCOUNTER — Inpatient Hospital Stay: Payer: Federal, State, Local not specified - PPO

## 2023-04-23 ENCOUNTER — Ambulatory Visit: Payer: Federal, State, Local not specified - PPO | Admitting: Family

## 2023-04-24 ENCOUNTER — Ambulatory Visit: Payer: Federal, State, Local not specified - PPO | Admitting: Physical Therapy

## 2023-04-25 ENCOUNTER — Other Ambulatory Visit (HOSPITAL_COMMUNITY): Payer: Self-pay

## 2023-04-25 ENCOUNTER — Telehealth: Payer: Self-pay

## 2023-04-25 NOTE — Telephone Encounter (Signed)
*  [facility]  Pharmacy Patient Advocate Encounter  *PA renewal request   Received notification from CoverMyMeds that prior authorization for Ocrevus 300MG /10ML solution  is required/requested.   Insurance verification completed.   The patient is insured through [facility] .   Per test claim: PA required; PA submitted to [facility] via CoverMyMeds Key/confirmation #/EOC [id number] Status is pending   *Previous PA approval expires 05/20/2023

## 2023-04-25 NOTE — Telephone Encounter (Signed)
Additional questions have populated and submitted to plan.

## 2023-04-26 ENCOUNTER — Other Ambulatory Visit: Payer: Self-pay

## 2023-04-26 ENCOUNTER — Encounter: Payer: Self-pay | Admitting: Oncology

## 2023-04-26 ENCOUNTER — Inpatient Hospital Stay: Payer: Federal, State, Local not specified - PPO | Admitting: Oncology

## 2023-04-26 ENCOUNTER — Inpatient Hospital Stay: Payer: Federal, State, Local not specified - PPO | Attending: Family

## 2023-04-26 VITALS — BP 141/89 | HR 80 | Temp 98.2°F | Resp 18 | Ht 64.0 in | Wt 170.4 lb

## 2023-04-26 DIAGNOSIS — Z9884 Bariatric surgery status: Secondary | ICD-10-CM | POA: Diagnosis not present

## 2023-04-26 DIAGNOSIS — D72829 Elevated white blood cell count, unspecified: Secondary | ICD-10-CM

## 2023-04-26 DIAGNOSIS — K909 Intestinal malabsorption, unspecified: Secondary | ICD-10-CM | POA: Insufficient documentation

## 2023-04-26 DIAGNOSIS — D509 Iron deficiency anemia, unspecified: Secondary | ICD-10-CM | POA: Insufficient documentation

## 2023-04-26 DIAGNOSIS — D508 Other iron deficiency anemias: Secondary | ICD-10-CM | POA: Diagnosis not present

## 2023-04-26 LAB — CBC WITH DIFFERENTIAL (CANCER CENTER ONLY)
Abs Immature Granulocytes: 0.06 10*3/uL (ref 0.00–0.07)
Basophils Absolute: 0.1 10*3/uL (ref 0.0–0.1)
Basophils Relative: 1 %
Eosinophils Absolute: 0.4 10*3/uL (ref 0.0–0.5)
Eosinophils Relative: 3 %
HCT: 38.3 % (ref 36.0–46.0)
Hemoglobin: 13 g/dL (ref 12.0–15.0)
Immature Granulocytes: 0 %
Lymphocytes Relative: 11 %
Lymphs Abs: 1.5 10*3/uL (ref 0.7–4.0)
MCH: 33.2 pg (ref 26.0–34.0)
MCHC: 33.9 g/dL (ref 30.0–36.0)
MCV: 97.7 fL (ref 80.0–100.0)
Monocytes Absolute: 1.5 10*3/uL — ABNORMAL HIGH (ref 0.1–1.0)
Monocytes Relative: 11 %
Neutro Abs: 10 10*3/uL — ABNORMAL HIGH (ref 1.7–7.7)
Neutrophils Relative %: 74 %
Platelet Count: 285 10*3/uL (ref 150–400)
RBC: 3.92 MIL/uL (ref 3.87–5.11)
RDW: 13 % (ref 11.5–15.5)
WBC Count: 13.6 10*3/uL — ABNORMAL HIGH (ref 4.0–10.5)
nRBC: 0 % (ref 0.0–0.2)

## 2023-04-26 LAB — RETICULOCYTES
Immature Retic Fract: 9.3 % (ref 2.3–15.9)
RBC.: 3.92 MIL/uL (ref 3.87–5.11)
Retic Count, Absolute: 58.4 10*3/uL (ref 19.0–186.0)
Retic Ct Pct: 1.5 % (ref 0.4–3.1)

## 2023-04-26 LAB — FERRITIN: Ferritin: 37 ng/mL (ref 11–307)

## 2023-04-26 NOTE — Progress Notes (Signed)
Hematology and Oncology Follow Up Visit  Catherine Hubbard [id number] 07-14-1961 62 y.o. 04/26/2023   Principle Diagnosis:  Iron deficiency anemia secondary to malabsorption, s/p gastric sleeve surgery in 2011   Current Therapy:   IV iron as indicated    Interim History:  Catherine Hubbard is here today for follow-up.  She last received Monoferric on 12/17/2022.  She continues to report fatigue.  She also reports dyspnea with exertion.  She tells me that she is currently on Keflex for a UTI.  He has her baseline neuropathy which is unchanged.  Denies fevers, chills, headaches, dizziness, chest pain, shortness of breath at rest.  Denies bleeding.  Weight remains stable.  Medications:  Allergies as of 04/26/2023       Reactions   Ibuprofen Other (See Comments)   Cannot take due to gastric bypass   Nsaids Other (See Comments)   Can not take due to Gastric bypass        Medication List        Accurate as of April 26, 2023  4:09 PM. If you have any questions, ask your nurse or doctor.          STOP taking these medications    Arexvy 120 MCG/0.5ML injection Generic drug: RSV vaccine recomb adjuvanted Stopped by: Clenton Pare   buPROPion 100 MG tablet Commonly known as: WELLBUTRIN Stopped by: Clenton Pare   cephALEXin 500 MG capsule Commonly known as: Keflex Stopped by: Clenton Pare   OMEGA-3 COMPLEX PO Stopped by: Clenton Pare   venlafaxine XR 37.5 MG 24 hr capsule Commonly known as: EFFEXOR-XR Stopped by: Clenton Pare       TAKE these medications    acetaminophen 500 MG tablet Commonly known as: TYLENOL Take 500 mg by mouth daily as needed (pain).   Ventolin HFA 108 (90 Base) MCG/ACT inhaler Generic drug: albuterol Inhale into the lungs. What changed: Another medication with the same name was changed. Make sure you understand how and when to take each.   albuterol (2.5 MG/3ML) 0.083% nebulizer solution Commonly known as:  PROVENTIL Take 3 mLs (2.5 mg total) by nebulization every 6 (six) hours as needed for wheezing or shortness of breath. What changed: when to take this   ALOE VERA JUICE PO Take by mouth.   amitriptyline 10 MG tablet Commonly known as: ELAVIL TAKE 1 TABLET BY MOUTH EVERYDAY AT BEDTIME   baclofen 10 MG tablet Commonly known as: LIORESAL Take 1 tablet (10 mg total) by mouth 3 (three) times daily as needed for muscle spasms.   bismuth subsalicylate 262 MG/15ML suspension Commonly known as: PEPTO BISMOL Take 30 mLs by mouth daily as needed (acid reflux).   budesonide-formoterol 80-4.5 MCG/ACT inhaler Commonly known as: SYMBICORT TAKE 2 PUFFS BY MOUTH TWICE A DAY   chlorthalidone 25 MG tablet Commonly known as: HYGROTON Take by mouth.   Cyanocobalamin 1000 MCG Tbcr Take by mouth.   D2000 Ultra Strength 50 MCG (2000 UT) Caps Generic drug: Cholecalciferol Take 2,000 Units by mouth daily.   diclofenac Sodium 1 % Gel Commonly known as: VOLTAREN Apply 4 g topically 4 (four) times daily.   DULoxetine 60 MG capsule Commonly known as: CYMBALTA Take by mouth.   EXCEDRIN PO Take 1 tablet by mouth as needed (headache).   fluticasone 50 MCG/ACT nasal spray Commonly known as: FLONASE SPRAY 2 SPRAYS INTO EACH NOSTRIL EVERY DAY   gabapentin 100 MG capsule Commonly known as: NEURONTIN TAKE 1 CAPSULE BY MOUTH THREE TIMES A DAY  Iron (Ferrous Sulfate) 325 (65 Fe) MG Tabs 1 tab po bid   meloxicam 7.5 MG tablet Commonly known as: Mobic Take 1 tablet (7.5 mg total) by mouth daily.   modafinil 200 MG tablet Commonly known as: PROVIGIL Take 1 tablet (200 mg total) by mouth daily.   multivitamin with minerals Tabs tablet Take 1 tablet by mouth daily.   mupirocin ointment 2 % Commonly known as: BACTROBAN Apply 1 Application topically 2 (two) times daily.   nystatin cream Commonly known as: MYCOSTATIN Apply 1 Application topically 2 (two) times daily.   Ocrevus 300 MG/10ML  injection Generic drug: ocrelizumab INFUSE 600MG  INTRAVENOUSLY AS DIRECTED EVERY 6 MONTHS.   olmesartan 5 MG tablet Commonly known as: BENICAR 2 tab po q day   omeprazole 20 MG capsule Commonly known as: PRILOSEC Take 40 mg by mouth 2 (two) times daily before a meal.   ondansetron 4 MG tablet Commonly known as: ZOFRAN TAKE 1 TABLET BY MOUTH EVERY 8 HOURS AS NEEDED FOR NAUSEA AND VOMITING   ondansetron 8 MG disintegrating tablet Commonly known as: ZOFRAN-ODT Take by mouth.   oxybutynin 5 MG tablet Commonly known as: DITROPAN Take 1 tablet (5 mg total) by mouth 2 (two) times daily.   THIAMINE PO Take by mouth.   TUMS PO Take 2 tablets by mouth 2 (two) times daily as needed (acid reflux).        Allergies:  Allergies  Allergen Reactions   Ibuprofen Other (See Comments)    Cannot take due to gastric bypass   Nsaids Other (See Comments)    Can not take due to Gastric bypass    Past Medical History, Surgical history, Social history, and Family History were reviewed and updated.  Review of Systems: All other 10 point review of systems is negative.   Physical Exam:  height is 5\' 4"  (1.626 m) and weight is 77.3 kg. Her oral temperature is 98.2 F (36.8 C). Her blood pressure is 141/89 (abnormal) and her pulse is 80. Her respiration is 18 and oxygen saturation is 98%.   Wt Readings from Last 3 Encounters:  04/26/23 77.3 kg  04/15/23 78.4 kg  03/13/23 74.8 kg    Ocular: Sclerae unicteric, pupils equal, round and reactive to light Ear-nose-throat: Oropharynx clear, dentition fair Lymphatic: No cervical or supraclavicular adenopathy Lungs no rales or rhonchi, good excursion bilaterally Heart regular rate and rhythm, no murmur appreciated Abd soft, nontender, positive bowel sounds MSK no focal spinal tenderness, no joint edema Neuro: non-focal, well-oriented, appropriate affect Breasts: Deferred   Lab Results  Component Value Date   WBC 13.6 (H) 04/26/2023    HGB 13.0 04/26/2023   HCT 38.3 04/26/2023   MCV 97.7 04/26/2023   PLT 285 04/26/2023   Lab Results  Component Value Date   FERRITIN 132 01/18/2023   IRON 155 01/18/2023   TIBC 312 01/18/2023   UIBC 157 01/18/2023   IRONPCTSAT 50 (H) 01/18/2023   Lab Results  Component Value Date   RETICCTPCT 1.5 04/26/2023   RBC 3.92 04/26/2023   No results found for: "KPAFRELGTCHN", "LAMBDASER", "KAPLAMBRATIO" Lab Results  Component Value Date   IGGSERUM 825 04/18/2023   IGMSERUM 155 04/18/2023   No results found for: "TOTALPROTELP", "ALBUMINELP", "A1GS", "A2GS", "BETS", "BETA2SER", "GAMS", "MSPIKE", "SPEI"   Chemistry      Component Value Date/Time   NA 139 04/15/2023 1649   NA 140 06/02/2019 1549   K 4.6 04/15/2023 1649   CL 97 04/15/2023 1649   CO2  33 (H) 04/15/2023 1649   BUN 8 04/15/2023 1649   BUN 15 06/02/2019 1549   CREATININE 0.69 04/15/2023 1649   CREATININE 0.72 12/07/2022 1457   CREATININE 0.62 11/27/2018 1505      Component Value Date/Time   CALCIUM 9.8 04/15/2023 1649   ALKPHOS 76 04/15/2023 1649   AST 30 04/15/2023 1649   AST 17 12/07/2022 1457   ALT 11 04/15/2023 1649   ALT 9 12/07/2022 1457   BILITOT 1.2 04/15/2023 1649   BILITOT 0.5 12/07/2022 1457       Impression and Plan: Ms. Zamor is a pleasant 62 yo caucasian female with recently diagnosed iron deficiency anemia.  CBC from today reviewed with the patient.  Her hemoglobin is normal at 13.0.  Ferritin and iron studies are pending.  We will follow-up on this.  Noted to have leukocytosis on her CBC today.  No fever.  She is on Keflex for UTI.  Will monitor her white blood cell count and if persistently elevated, will consider additional workup.  Follow-up in 3 to 4 months with repeat lab work.  Clenton Pare, NP 9/6/20244:09 PM

## 2023-04-29 ENCOUNTER — Other Ambulatory Visit (HOSPITAL_COMMUNITY): Payer: Self-pay

## 2023-04-29 ENCOUNTER — Encounter: Payer: Self-pay | Admitting: Family

## 2023-04-29 LAB — IRON AND IRON BINDING CAPACITY (CC-WL,HP ONLY)
Iron: 100 ug/dL (ref 28–170)
Saturation Ratios: 31 % (ref 10.4–31.8)
TIBC: 319 ug/dL (ref 250–450)
UIBC: 219 ug/dL (ref 148–442)

## 2023-04-29 NOTE — Telephone Encounter (Signed)
Pharmacy Patient Advocate Encounter  Received notification from [facility] that Prior Authorization for Ocrevus 300MG /10ML solution has been APPROVED from 03-27-2023 to 04-25-2025. Ran test claim, Copay is $255.00. Quantity approved 300 mg per 90 days. Insurance states 0 (zero) retail fills available. This test claim was processed through [facility]- copay amounts may vary at other pharmacies due to pharmacy/plan contracts, or as the patient moves through the different stages of their insurance plan.   PA #/Case ID/Reference #: [id number]

## 2023-04-30 ENCOUNTER — Ambulatory Visit: Payer: Federal, State, Local not specified - PPO | Attending: Sports Medicine

## 2023-04-30 DIAGNOSIS — M25562 Pain in left knee: Secondary | ICD-10-CM | POA: Insufficient documentation

## 2023-04-30 DIAGNOSIS — M25561 Pain in right knee: Secondary | ICD-10-CM | POA: Insufficient documentation

## 2023-04-30 DIAGNOSIS — R252 Cramp and spasm: Secondary | ICD-10-CM | POA: Insufficient documentation

## 2023-04-30 NOTE — Therapy (Deleted)
OUTPATIENT PHYSICAL THERAPY LOWER EXTREMITY EVALUATION   Patient Name: Catherine Hubbard MRN: [id number] DOB:May 16, 1961, 62 y.o., female Today's Date: 04/30/2023  END OF SESSION:   Past Medical History:  Diagnosis Date   Alcohol abuse    Asthma    Esophagitis    GERD (gastroesophageal reflux disease)    Headache    Hearing loss    Hypertension    Multiple sclerosis (HCC)    Pancreatitis    Vision abnormalities    Past Surgical History:  Procedure Laterality Date   ANKLE FRACTURE SURGERY Bilateral    CARPAL TUNNEL RELEASE Bilateral    CHOLECYSTECTOMY     COLONOSCOPY  11/2016   multiple   ESOPHAGOGASTRODUODENOSCOPY  11/2016   ESOPHAGOGASTRODUODENOSCOPY N/A 02/01/2017   Procedure: ESOPHAGOGASTRODUODENOSCOPY (EGD);  Surgeon: Iva Boop, MD;  Location: [facility] ENDOSCOPY;  Service: Endoscopy;  Laterality: N/A;   ESOPHAGOGASTRODUODENOSCOPY (EGD) WITH PROPOFOL N/A 02/04/2017   Procedure: ESOPHAGOGASTRODUODENOSCOPY (EGD) WITH PROPOFOL;  Surgeon: Meryl Dare, MD;  Location: [facility] ENDOSCOPY;  Service: Endoscopy;  Laterality: N/A;   LAPAROSCOPIC GASTRIC SLEEVE RESECTION  2011   ULNAR NERVE REPAIR Bilateral    Patient Active Problem List   Diagnosis Date Noted   Primary osteoarthritis of both knees 11/29/2022   Hematoma 11/29/2022   Post-traumatic osteoarthritis of right ankle 11/29/2022   Impingement syndrome of left ankle 11/29/2022   Iron deficiency anemia 11/29/2022   Compression fracture of L1 lumbar vertebra (HCC) 11/29/2022   Estrogen deficiency 11/29/2022   Skin tear of forearm without complication, subsequent encounter 04/06/2022   Hyponatremia 03/28/2022   Frequent falls 03/28/2022   Hypomagnesemia 03/28/2022   Alcohol abuse 03/28/2022   GERD without esophagitis 03/28/2022   History of alcohol abuse 12/02/2019   Multiple rib fractures 04/14/2019   Multiple fractures of ribs, bilateral, initial encounter for closed fracture 04/14/2019   Closed 3-part fracture  of proximal humerus with routine healing 04/01/2019   Multiple sclerosis exacerbation (HCC) 11/16/2018   Exacerbation of multiple sclerosis (HCC) 11/16/2018   Elevated troponin 09/26/2018   Acute hypokalemia 09/25/2018   Arthritis of carpometacarpal joint 03/27/2018   Nocturnal leg cramps 03/26/2018   Syncope due to orthostatic hypotension 02/12/2018   Diarrhea 02/08/2018   Urinary tract infection without hematuria 02/08/2018   Hematoma of scalp 02/08/2018   Neck pain 01/20/2018   Foreign body in esophagus    Esophageal stricture    Intractable nausea and vomiting 01/31/2017   Dysphagia    Severe protein-calorie malnutrition (HCC)    Potassium disorder 01/30/2017   Cystitis 01/30/2017   Costochondritis 05/11/2016   Trochanteric bursitis of both hips 03/28/2016   Mild intermittent asthma without complication 11/10/2015   Bilateral low back pain with bilateral sciatica 07/13/2015   Lumbar radicular pain 03/21/2015   Other fatigue 01/11/2015   Dysesthesia 01/11/2015   Urinary urgency 01/11/2015   Relapsing remitting multiple sclerosis (HCC) 10/06/2014   Numbness 10/06/2014   Ataxic gait 10/06/2014   High risk medication use 10/06/2014   Gastric bypass status for obesity 10/06/2014   Cognitive changes 10/06/2014   Depression with anxiety 10/06/2014   Restless leg 10/06/2014   Insomnia 10/06/2014   Essential hypertension 10/06/2014   Difficulty hearing 01/13/2014   Other muscle spasm 01/13/2014    PCP: ***  REFERRING PROVIDER: ***  REFERRING DIAG: ***  THERAPY DIAG:  No diagnosis found.  Rationale for Evaluation and Treatment: {HABREHAB:27488}  ONSET DATE: ***  SUBJECTIVE:   SUBJECTIVE STATEMENT: ***  PERTINENT HISTORY: *** PAIN:  Are  you having pain? {OPRCPAIN:27236}  PRECAUTIONS: {Therapy precautions:24002}  RED FLAGS: {PT Red Flags:29287}   WEIGHT BEARING RESTRICTIONS: {Yes ***/No:24003}  FALLS:  Has patient fallen in last 6 months?  {fallsyesno:27318}  LIVING ENVIRONMENT: Lives with: {OPRC lives with:25569::"lives with their family"} Lives in: {Lives in:25570} Stairs: {opstairs:27293} Has following equipment at home: {Assistive devices:23999}  OCCUPATION: ***  PLOF: {PLOF:24004}  PATIENT GOALS: ***  NEXT MD VISIT: ***  OBJECTIVE:   DIAGNOSTIC FINDINGS: ***  PATIENT SURVEYS:  {rehab surveys:24030}  COGNITION: Overall cognitive status: {cognition:24006}     SENSATION: {sensation:27233}  EDEMA:  {edema:24020}  MUSCLE LENGTH: Hamstrings: Right *** deg; Left *** deg Thomas test: Right *** deg; Left *** deg  POSTURE: {posture:25561}  PALPATION: ***  LOWER EXTREMITY ROM:  {AROM/PROM:27142} ROM Right eval Left eval  Hip flexion    Hip extension    Hip abduction    Hip adduction    Hip internal rotation    Hip external rotation    Knee flexion    Knee extension    Ankle dorsiflexion    Ankle plantarflexion    Ankle inversion    Ankle eversion     (Blank rows = not tested)  LOWER EXTREMITY MMT:  MMT Right eval Left eval  Hip flexion    Hip extension    Hip abduction    Hip adduction    Hip internal rotation    Hip external rotation    Knee flexion    Knee extension    Ankle dorsiflexion    Ankle plantarflexion    Ankle inversion    Ankle eversion     (Blank rows = not tested)  LOWER EXTREMITY SPECIAL TESTS:  {LEspecialtests:26242}  FUNCTIONAL TESTS:  {Functional tests:24029}  GAIT: Distance walked: *** Assistive device utilized: {Assistive devices:23999} Level of assistance: {Levels of assistance:24026} Comments: ***   TODAY'S TREATMENT:                                                                                                                              DATE: ***    PATIENT EDUCATION:  Education details: *** Person educated: {Person educated:25204} Education method: {Education Method:25205} Education comprehension: {Education  Comprehension:25206}  HOME EXERCISE PROGRAM: ***  ASSESSMENT:  CLINICAL IMPRESSION: Patient is a *** y.o. *** who was seen today for physical therapy evaluation and treatment for ***.   OBJECTIVE IMPAIRMENTS: {opptimpairments:25111}.   ACTIVITY LIMITATIONS: {activitylimitations:27494}  PARTICIPATION LIMITATIONS: {participationrestrictions:25113}  PERSONAL FACTORS: {Personal factors:25162} are also affecting patient's functional outcome.   REHAB POTENTIAL: {rehabpotential:25112}  CLINICAL DECISION MAKING: {clinical decision making:25114}  EVALUATION COMPLEXITY: {Evaluation complexity:25115}   GOALS: Goals reviewed with patient? {yes/no:20286}  SHORT TERM GOALS: Target date: *** *** Baseline: Goal status: INITIAL  2.  *** Baseline:  Goal status: INITIAL  3.  *** Baseline:  Goal status: INITIAL  4.  *** Baseline:  Goal status: INITIAL  5.  *** Baseline:  Goal status: INITIAL  6.  *** Baseline:  Goal status: INITIAL  LONG TERM GOALS: Target date: ***  *** Baseline:  Goal status: INITIAL  2.  *** Baseline:  Goal status: INITIAL  3.  *** Baseline:  Goal status: INITIAL  4.  *** Baseline:  Goal status: INITIAL  5.  *** Baseline:  Goal status: INITIAL  6.  *** Baseline:  Goal status: INITIAL   PLAN:  PT FREQUENCY: {rehab frequency:25116}  PT DURATION: {rehab duration:25117}  PLANNED INTERVENTIONS: {rehab planned interventions:25118::"Therapeutic exercises","Therapeutic activity","Neuromuscular re-education","Balance training","Gait training","Patient/Family education","Self Care","Joint mobilization"}  PLAN FOR NEXT SESSION: ***   Jiovani Mccammon L Aleida Crandell, PT 04/30/2023, 8:18 AM

## 2023-05-07 ENCOUNTER — Ambulatory Visit: Payer: Federal, State, Local not specified - PPO | Admitting: Sports Medicine

## 2023-05-10 ENCOUNTER — Encounter
Payer: Federal, State, Local not specified - PPO | Attending: Physical Medicine and Rehabilitation | Admitting: Physical Medicine and Rehabilitation

## 2023-05-10 ENCOUNTER — Encounter: Payer: Self-pay | Admitting: Physical Medicine and Rehabilitation

## 2023-05-10 VITALS — BP 148/109 | HR 83 | Ht 64.0 in | Wt 163.0 lb

## 2023-05-10 DIAGNOSIS — M7918 Myalgia, other site: Secondary | ICD-10-CM | POA: Diagnosis not present

## 2023-05-10 MED ORDER — SODIUM CHLORIDE (PF) 0.9 % IJ SOLN
2.0000 mL | INTRAMUSCULAR | Status: DC | PRN
Start: 2023-05-10 — End: 2024-04-28

## 2023-05-10 MED ORDER — LIDOCAINE HCL 1 % IJ SOLN
3.0000 mL | Freq: Once | INTRAMUSCULAR | Status: AC
Start: 2023-05-10 — End: 2023-05-10
  Administered 2023-05-10: 3 mL

## 2023-05-10 NOTE — Progress Notes (Signed)

## 2023-05-13 DIAGNOSIS — G35 Multiple sclerosis: Secondary | ICD-10-CM | POA: Diagnosis not present

## 2023-05-14 NOTE — Therapy (Addendum)
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    PT Start Time 1313    PT Stop Time 1355    PT Time Calculation (min) 42 min    Activity Tolerance Patient tolerated treatment well    Behavior During Therapy WFL for tasks assessed/performed             Past Medical History:  Diagnosis Date   Alcohol abuse    Asthma    Esophagitis    GERD (gastroesophageal reflux disease)    Headache    Hearing loss    Hypertension    Multiple sclerosis (HCC)    Pancreatitis    Vision abnormalities    Past Surgical History:  Procedure Laterality Date   ANKLE FRACTURE SURGERY Bilateral    CARPAL TUNNEL RELEASE Bilateral    CHOLECYSTECTOMY     COLONOSCOPY  11/2016   multiple   ESOPHAGOGASTRODUODENOSCOPY  11/2016   ESOPHAGOGASTRODUODENOSCOPY N/A 02/01/2017   Procedure: ESOPHAGOGASTRODUODENOSCOPY (EGD);  Surgeon: Iva Boop, MD;  Location: [facility] ENDOSCOPY;  Service: Endoscopy;  Laterality: N/A;   ESOPHAGOGASTRODUODENOSCOPY (EGD) WITH PROPOFOL N/A 02/04/2017   Procedure: ESOPHAGOGASTRODUODENOSCOPY (EGD) WITH PROPOFOL;  Surgeon: Meryl Dare, MD;  Location: [facility] ENDOSCOPY;  Service: Endoscopy;  Laterality: N/A;   LAPAROSCOPIC GASTRIC SLEEVE RESECTION  2011   ULNAR NERVE REPAIR Bilateral    Patient Active Problem List   Diagnosis Date Noted   Primary osteoarthritis of both knees 11/29/2022   Hematoma 11/29/2022   Post-traumatic osteoarthritis of right ankle 11/29/2022   Impingement syndrome of left ankle 11/29/2022   Iron deficiency anemia 11/29/2022   Compression fracture of L1 lumbar vertebra (HCC) 11/29/2022   Estrogen deficiency 11/29/2022   Skin tear of  forearm without complication, subsequent encounter 04/06/2022   Hyponatremia 03/28/2022   Frequent falls 03/28/2022   Hypomagnesemia 03/28/2022   Alcohol abuse 03/28/2022   GERD without esophagitis 03/28/2022   History of alcohol abuse 12/02/2019   Multiple rib fractures 04/14/2019   Multiple fractures of ribs, bilateral, initial encounter for closed fracture 04/14/2019   Closed 3-part fracture of proximal humerus with routine healing 04/01/2019   Multiple sclerosis exacerbation (HCC) 11/16/2018   Exacerbation of multiple sclerosis (HCC) 11/16/2018   Elevated troponin 09/26/2018   Acute hypokalemia 09/25/2018   Arthritis of carpometacarpal joint 03/27/2018   Nocturnal leg cramps 03/26/2018   Syncope due to orthostatic hypotension 02/12/2018   Diarrhea 02/08/2018   Urinary tract infection without hematuria 02/08/2018   Hematoma of scalp 02/08/2018   Neck pain 01/20/2018   Foreign body in esophagus    Esophageal stricture    Intractable nausea and vomiting 01/31/2017   Dysphagia    Severe protein-calorie malnutrition (HCC)    Potassium disorder 01/30/2017   Cystitis 01/30/2017   Costochondritis 05/11/2016   Trochanteric bursitis of both hips 03/28/2016   Mild intermittent asthma without complication 11/10/2015   Bilateral low back pain with bilateral sciatica 07/13/2015   Lumbar radicular pain 03/21/2015   Other fatigue 01/11/2015   Dysesthesia 01/11/2015   Urinary urgency 01/11/2015   Relapsing remitting multiple sclerosis (HCC) 10/06/2014   Numbness 10/06/2014   Ataxic gait 10/06/2014   High  risk medication use 10/06/2014   Gastric bypass status for obesity 10/06/2014   Cognitive changes 10/06/2014   Depression with anxiety 10/06/2014   Restless leg 10/06/2014   Insomnia 10/06/2014   Essential hypertension 10/06/2014   Difficulty hearing 01/13/2014   Other muscle spasm 01/13/2014    PCP: Marisue Brooklyn   REFERRING PROVIDER: Ralene Cork,  DO   REFERRING DIAG: M17.0 (ICD-10-CM) - Primary osteoarthritis of both knees   THERAPY DIAG:  Acute pain of left knee  Acute pain of right knee  Cramp and spasm  Rationale for Evaluation and Treatment: Rehabilitation  ONSET DATE: a few months ago  SUBJECTIVE:   SUBJECTIVE STATEMENT: The left knee is worse. Sitting is no problem. Pain comes and goes. Popping in both knees. Worse pain with flexing knee after it is extended. She has intermittent edema to where her lower legs are bigger than her thighs. This has been happening for the last year.   PERTINENT HISTORY: MS, asthma, HTN PAIN:  Are you having pain? Yes: NPRS scale: 6/10 Pain location: front of knee L>R Pain description: feels hot down left lower leg to toes Aggravating factors: knee flexion after extension Relieving factors: sitting  PRECAUTIONS: None and Other: MS  RED FLAGS: None   WEIGHT BEARING RESTRICTIONS: No  FALLS:  Has patient fallen in last 6 months? No  LIVING ENVIRONMENT: Lives with: lives with their family Lives in: House/apartment Stairs: Yes: External: 1 steps; none Has following equipment at home: Single point cane and Walker - 2 wheeled  OCCUPATION: retired  PLOF: Independent with basic ADLs  PATIENT GOALS: get her knee pain to go away  NEXT MD VISIT:   OBJECTIVE:   DIAGNOSTIC FINDINGS: XRAYS Left: IMPRESSION: 1. No fracture or acute finding. 2. Degenerative/arthropathic changes primarily involving the patellofemoral joint space compartment. No joint effusion.  Right: IMPRESSION: 1. Mild patellofemoral degenerative/arthropathic changes. No fracture or acute finding.  PATIENT SURVEYS:  LEFS 31 / 80 = 38.8 %  COGNITION: Overall cognitive status: Within functional limits for tasks assessed     SENSATION: WFL  EDEMA:  Mild edema L knee compared to R  MUSCLE LENGTH: HS: mild Quads: B tightness ITB: B tightness Piriformis: B mild tightness   POSTURE:    Left genu  valgus PALPATION: VLO B, B ITB, medial left soleus  LOWER EXTREMITY ROM:  Active ROM Right eval Left eval  Hip flexion    Hip extension    Hip abduction    Hip adduction    Hip internal rotation    Hip external rotation    Knee flexion 128 127  Knee extension 0 0  Ankle dorsiflexion    Ankle plantarflexion    Ankle inversion    Ankle eversion     (Blank rows = not tested)  LOWER EXTREMITY MMT: ABD/ADD tested in hooklying  MMT Right eval Left eval  Hip flexion 4 4  Hip extension 4+ 4+  Hip abduction 5 4+  Hip adduction 5 5  Hip internal rotation    Hip external rotation    Knee flexion 4- 4+  Knee extension 5 5  Ankle dorsiflexion 5 5  Ankle plantarflexion    Ankle inversion    Ankle eversion     (Blank rows = not tested)  LOWER EXTREMITY SPECIAL TESTS:  Knee special tests: Patellafemoral grind test: positive   FUNCTIONAL TESTS:  5 times sit to stand: 28.4 sec with B UE support  and 9/10 pain in L knee  GAIT: Distance walked: 20 Assistive device utilized: Single point cane Level of assistance: Modified independence Comments: wide BOS, decreased step length and gait speed   TODAY'S TREATMENT:                                                                                                                              DATE:   05/15/23 See pt ed and HEP   PATIENT EDUCATION:  Education details: PT eval findings, anticipated POC, initial HEP, and role of DN  Person educated: Patient Education method: Explanation, Demonstration, and Handouts Education comprehension: verbalized understanding and returned demonstration  HOME EXERCISE PROGRAM: Access Code: [email or web address] URL: [email or web address] Date: 05/15/2023 Prepared by: Raynelle Fanning  Exercises - Supine Quad Set  - 1 x daily - 7 x weekly - 2 sets - 10 reps - 5 sec hold - Active Straight Leg Raise with Quad Set  - 1 x daily - 3-4 x weekly - 1-2 sets - 10 reps - Standing Hip Abduction with Counter  Support  - 1 x daily - 3-4 x weekly - 1-3 sets - 10 reps - Supine Quadriceps Stretch with Strap on Table  - 2 x daily - 7 x weekly - 1 sets - 3 reps - 30 sec hold  ASSESSMENT:  CLINICAL IMPRESSION: Patient is a 62 y.o. female who was seen today for physical therapy evaluation and treatment for B knee pain, L> R starting a few months ago of unknown reason.  She has tightness in B ITB and quads, and pain in distal VLO. She has pain in the left knee with knee flex and extension with crepitis. Pain is affecting her ability to walk and climb stairs. She will benefit from skilled PT to address these deficits.  .   OBJECTIVE IMPAIRMENTS: Abnormal gait, decreased strength, increased edema, increased muscle spasms, impaired flexibility, postural dysfunction, and pain.   ACTIVITY LIMITATIONS: squatting, stairs, transfers, and locomotion level  PARTICIPATION LIMITATIONS:  N/A  PERSONAL FACTORS: 1 comorbidity: MS and high copay  are also affecting patient's functional outcome.   REHAB POTENTIAL: Good  CLINICAL DECISION MAKING: Stable/uncomplicated  EVALUATION COMPLEXITY: Low   SHORT TERM GOALS: Target date: 06/12/2023    Ind with initial HEP Baseline: Goal status: INITIAL  2.  Decreased knee pain by 25% with ADLS Baseline:  Goal status: INITIAL   LONG TERM GOALS: Target date: 07/10/2023   Ind with advanced HEP and its progression Baseline:  Goal status: INITIAL  2. IImproved  LEFS by 40/80 showing functional improvement Baseline: 31 / 80 = 38.8 % Goal status: INITIAL   3.  Improved LE strength to 5/5 to improve function Baseline:  Goal status: INITIAL  4.  Decreased pain in the R knee by >=75% with ADLs. Baseline:  Goal status: INITIAL   PLAN:  PT FREQUENCY: every other week for 4 visits total due to high copay  PT DURATION: 8 weeks  PLANNED INTERVENTIONS: Therapeutic exercises, Therapeutic activity, Neuromuscular re-education,  Balance training, Gait training,  Patient/Family education, Self Care, Joint mobilization, Dry Needling, Electrical stimulation, Cryotherapy, Moist heat, Taping, Ionotophoresis 4mg /ml Dexamethasone, and Manual therapy  PLAN FOR NEXT SESSION: Tape for medial glide of left patella, DN to lateral quads, LE strength   Solon Palm, PT  05/15/2023, 2:23 PM   PHYSICAL THERAPY DISCHARGE SUMMARY  Visits from Start of Care: 1  Current functional level related to goals / functional outcomes: Unknown as patient never returned for f/u visits.   Remaining deficits: See above   Education / Equipment: HEP   Patient agrees to discharge. Patient goals were not met. Patient is being discharged due to not returning since the last visit. Patient cancelled or did not show for her f/u appointments.   Solon Palm, PT 07/03/23 11:30 AM [facility] [address] [place], [place] [address] [phone]  Fax: [phone]

## 2023-05-15 ENCOUNTER — Other Ambulatory Visit: Payer: Self-pay

## 2023-05-15 ENCOUNTER — Ambulatory Visit: Payer: Federal, State, Local not specified - PPO | Admitting: Physical Therapy

## 2023-05-15 DIAGNOSIS — R252 Cramp and spasm: Secondary | ICD-10-CM | POA: Diagnosis not present

## 2023-05-15 DIAGNOSIS — M25562 Pain in left knee: Secondary | ICD-10-CM

## 2023-05-15 DIAGNOSIS — M25561 Pain in right knee: Secondary | ICD-10-CM | POA: Diagnosis not present

## 2023-05-20 ENCOUNTER — Encounter: Payer: Self-pay | Admitting: Neurology

## 2023-05-20 ENCOUNTER — Ambulatory Visit: Payer: Federal, State, Local not specified - PPO | Admitting: Neurology

## 2023-05-20 VITALS — BP 147/95 | HR 84 | Ht 65.0 in | Wt 163.0 lb

## 2023-05-20 DIAGNOSIS — R260 Ataxic gait: Secondary | ICD-10-CM | POA: Diagnosis not present

## 2023-05-20 DIAGNOSIS — G35 Multiple sclerosis: Secondary | ICD-10-CM

## 2023-05-20 DIAGNOSIS — F1011 Alcohol abuse, in remission: Secondary | ICD-10-CM | POA: Diagnosis not present

## 2023-05-20 DIAGNOSIS — R3915 Urgency of urination: Secondary | ICD-10-CM

## 2023-05-20 DIAGNOSIS — Z79899 Other long term (current) drug therapy: Secondary | ICD-10-CM

## 2023-05-20 DIAGNOSIS — R5383 Other fatigue: Secondary | ICD-10-CM

## 2023-05-20 MED ORDER — MODAFINIL 200 MG PO TABS: ORAL_TABLET | ORAL | 5 refills | Status: DC

## 2023-05-20 NOTE — Progress Notes (Signed)
[facility]  PATIENT: Catherine Hubbard DOB: November 03, 1960   _________________________________   HISTORICAL  CHIEF COMPLAINT:  Chief Complaint  Patient presents with   Follow-up    Pt in room 10. Here for MS follow up. Pt reports doing well. Pt said she felt MS hug 3 times this year. Otherwise doing well. Pt blood pressure elevated x 1.    HISTORY OF PRESENT ILLNESS:  Catherine Hubbard is a 62 y.o. woman with relapsing remitting MS.    Update 05/20/2023: She is on Ocrevus and denies any new exacerbations. She is tolerating it well.   Last year, CD20 count was 0 and IgG and IgM were normal.      She denies exacerbation though feels her gait is worse - she feels more related to knee pain than her MS.     She is walking without her cane.  She has no recent falls.  She uses the banister on stairs.  She has mild weakness in her legs.    She denies numbness but ha mild tingling at times in hands.     Vision is doing well.      She feels bladder function is unchanged with urinary urgency and no recent incontinence.  . She takes oxybutynin if she could be out of the house.  She reports being fatigued.  Modafinil helps some.  She did better on 1-1/2 pills a day instead of 1 pill a day.  Mood has been stable.  She reports she is rarely drinking ETOH now.     Sleep varies.    She has needed to do DMV forms in the past even though we stated she did not need serial evaluations.   If we get a form, we can fill it   MS History:   She was diagnosed with multiple sclerosis in 2009. Before that time, she had noted some gait ataxia but had not had any imaging studies. In 2009, due to progressive hearing loss, she had an MRI of the brain and  then had a lumbar puncture that also confirmed the diagnosis.  I have some of her early MRI reports there are several foci noted in the cervical and thoracic spinal cord in the brain, there were multiple hyperintense foci, mostly in the  periventricular deep white matter many of these were contrast enhancing on 07/26/2008. There was another enhancing lesion adjacent to C2 in the spinal cord.  Initially, she was placed on Avonex. She did not feel good and she stopped. She then tried Copaxone but also stopped after a while. She moved here in 2014 and saw Dr. Renne Crigler at [facility] She was placed on Rebif. She tolerates Rebif well.        IMAGING: MRI from 12/04/2012  showed multiple foci in the spinal cord at C2, C3-C4, and C4-C5. There were also foci at T4, T7, T9 and T10. The MRI of the brain showed 2 nonenhancing foci not present from studies in 2011. She reports having an MRI at [facility] 2015 but we do not have those films.  MRI cervical spine 07/22/2021 shows foci at C2, C4 and C5, seen previously.   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   MRI brain 11/16/2018 showed MS lesion with no progression compared to 2018.     REVIEW OF SYSTEMS:  Constitutional: No fevers, chills, sweats, or change in appetite.    She reports fatigue and insomnia Eyes: see above.  No eye pain  But some light sensitivity. Ear, nose and throat: No hearing loss, ear pain, nasal congestion, sore throat.   tinnitus Cardiovascular: No chest pain, palpitations Respiratory:  No shortness of breath at rest or with exertion.   Some wheezes, once on asthma med's Gastrointestinal: No nausea, vomiting, diarrhea, abdominal pain, fecal incontinence.  She has esophagitis Genitourinary:  see above Musculoskeletal:  see above  She also notes neck pain Integumentary: No rash, pruritus, skin lesions Neurological: as above Psychiatric: as above Endocrine: No palpitations, diaphoresis, change in appetite, change in weigh or increased thirst.  Often feels cold Hematologic/Lymphatic:  No anemia, purpura, petechiae. Allergic/Immunologic: No itchy/runny eyes, nasal congestion, recent allergic reactions, rashes  ALLERGIES: Allergies  Allergen Reactions    Ibuprofen Other (See Comments)    Cannot take due to gastric bypass   Nsaids Other (See Comments)    Can not take due to Gastric bypass    HOME MEDICATIONS:  Current Outpatient Medications:    acetaminophen (TYLENOL) 500 MG tablet, Take 500 mg by mouth daily as needed (pain)., Disp: , Rfl:    albuterol (PROVENTIL) (2.5 MG/3ML) 0.083% nebulizer solution, Take 3 mLs (2.5 mg total) by nebulization every 6 (six) hours as needed for wheezing or shortness of breath. (Patient taking differently: Take 2.5 mg by nebulization as needed for wheezing or shortness of breath.), Disp: 150 mL, Rfl: 1   albuterol (VENTOLIN HFA) 108 (90 Base) MCG/ACT inhaler, Inhale into the lungs., Disp: , Rfl:    amitriptyline (ELAVIL) 10 MG tablet, TAKE 1 TABLET BY MOUTH EVERYDAY AT BEDTIME, Disp: 90 tablet, Rfl: 1   Ascorbic Acid (VITAMIN C PO), Take by mouth. 1 tablet daily, Disp: , Rfl:    Aspirin-Acetaminophen-Caffeine (EXCEDRIN PO), Take 1 tablet by mouth as needed (headache)., Disp: , Rfl:    B Complex Vitamins (VITAMIN B COMPLEX PO), Take by mouth. 1 daily, Disp: , Rfl:    baclofen (LIORESAL) 10 MG tablet, Take 1 tablet (10 mg total) by mouth 3 (three) times daily as needed for muscle spasms., Disp: 90 tablet, Rfl: 11   bismuth subsalicylate (PEPTO BISMOL) 262 MG/15ML suspension, Take 30 mLs by mouth daily as needed (acid reflux)., Disp: , Rfl:    budesonide-formoterol (SYMBICORT) 80-4.5 MCG/ACT inhaler, TAKE 2 PUFFS BY MOUTH TWICE A DAY, Disp: 10.2 each, Rfl: 3   Calcium Carbonate Antacid (TUMS PO), Take 2 tablets by mouth 2 (two) times daily as needed (acid reflux)., Disp: , Rfl:    Cholecalciferol (D2000 ULTRA STRENGTH) 50 MCG (2000 UT) CAPS, Take 2,000 Units by mouth daily., Disp: , Rfl:    fluticasone (FLONASE) 50 MCG/ACT nasal spray, SPRAY 2 SPRAYS INTO EACH NOSTRIL EVERY DAY, Disp: 48 mL, Rfl: 3   gabapentin (NEURONTIN) 100 MG capsule, TAKE 1 CAPSULE BY MOUTH THREE TIMES A DAY, Disp: 90 capsule, Rfl: 1    IRON CR PO, Take by mouth. 1 tablet daily, Disp: , Rfl:    Iron, Ferrous Sulfate, 325 (65 Fe) MG TABS, 1 tab po bid, Disp: 60 tablet, Rfl: 3   meloxicam (MOBIC) 7.5 MG tablet, Take 1 tablet (7.5 mg total) by mouth daily., Disp: 30 tablet, Rfl: 2   Multiple Vitamin (MULTIVITAMIN WITH MINERALS) TABS tablet, Take 1 tablet by mouth daily., Disp: , Rfl:    nystatin cream (MYCOSTATIN), Apply 1 Application topically 2 (two) times daily., Disp: 30 g, Rfl: 1   ocrelizumab (OCREVUS) 300 MG/10ML injection, INFUSE 600MG  INTRAVENOUSLY AS DIRECTED EVERY 6 MONTHS., Disp: 20 mL, Rfl: 1   olmesartan (BENICAR)  5 MG tablet, 2 tab po q day, Disp: 180 tablet, Rfl: 3   omeprazole (PRILOSEC) 20 MG capsule, Take 40 mg by mouth 2 (two) times daily before a meal., Disp: , Rfl:    ondansetron (ZOFRAN) 4 MG tablet, TAKE 1 TABLET BY MOUTH EVERY 8 HOURS AS NEEDED FOR NAUSEA AND VOMITING, Disp: 16 tablet, Rfl: 1   ondansetron (ZOFRAN-ODT) 8 MG disintegrating tablet, Take by mouth., Disp: , Rfl:    oxybutynin (DITROPAN) 5 MG tablet, Take 1 tablet (5 mg total) by mouth 2 (two) times daily., Disp: 60 tablet, Rfl: 11   Probiotic Product (PROBIOTIC DAILY PO), Take by mouth. 1 daily, Disp: , Rfl:    Thiamine HCl (THIAMINE PO), Take by mouth., Disp: , Rfl:    ALOE VERA JUICE PO, Take by mouth. (Patient not taking: Reported on 05/15/2023), Disp: , Rfl:    chlorthalidone (HYGROTON) 25 MG tablet, Take by mouth. (Patient not taking: Reported on 05/20/2023), Disp: , Rfl:    Cyanocobalamin 1000 MCG TBCR, Take by mouth. (Patient not taking: Reported on 05/20/2023), Disp: , Rfl:    diclofenac Sodium (VOLTAREN) 1 % GEL, Apply 4 g topically 4 (four) times daily. (Patient not taking: Reported on 05/20/2023), Disp: 400 g, Rfl: 1   DULoxetine (CYMBALTA) 60 MG capsule, Take by mouth. (Patient not taking: Reported on 05/20/2023), Disp: , Rfl:    modafinil (PROVIGIL) 200 MG tablet, Take one po qAM and 1/2 po qPM, Disp: 45 tablet, Rfl: 5   mupirocin  ointment (BACTROBAN) 2 %, Apply 1 Application topically 2 (two) times daily. (Patient not taking: Reported on 05/20/2023), Disp: 22 g, Rfl: 2  Current Facility-Administered Medications:    sodium chloride (PF) 0.9 % injection 2 mL, 2 mL, Intravenous, PRN,   PAST MEDICAL HISTORY: Past Medical History:  Diagnosis Date   Alcohol abuse    Asthma    Esophagitis    GERD (gastroesophageal reflux disease)    Headache    Hearing loss    Hypertension    Multiple sclerosis (HCC)    Pancreatitis    Vision abnormalities     PAST SURGICAL HISTORY: Past Surgical History:  Procedure Laterality Date   ANKLE FRACTURE SURGERY Bilateral    CARPAL TUNNEL RELEASE Bilateral    CHOLECYSTECTOMY     COLONOSCOPY  11/2016   multiple   ESOPHAGOGASTRODUODENOSCOPY  11/2016   ESOPHAGOGASTRODUODENOSCOPY N/A 02/01/2017   Procedure: ESOPHAGOGASTRODUODENOSCOPY (EGD);  Surgeon: Iva Boop, MD;  Location[facility] ENDOSCOPY;  Service: Endoscopy;  Laterality: N/A;   ESOPHAGOGASTRODUODENOSCOPY (EGD) WITH PROPOFOL N/A 02/04/2017   Procedure: ESOPHAGOGASTRODUODENOSCOPY (EGD) WITH PROPOFOL;  Surgeon: Meryl Dare, MD;  Location: [facility] ENDOSCOPY;  Service: Endoscopy;  Laterality: N/A;   LAPAROSCOPIC GASTRIC SLEEVE RESECTION  2011   ULNAR NERVE REPAIR Bilateral     FAMILY HISTORY: Family History  Problem Relation Age of Onset   Cancer Mother    Stroke Mother    Cancer Father     SOCIAL HISTORY:  Social History   Socioeconomic History   Marital status: Married    Spouse name: Not on file   Number of children: Not on file   Years of education: Not on file   Highest education level: Not on file  Occupational History   Not on file  Tobacco Use   Smoking status: Never   Smokeless tobacco: Never   Tobacco comments:    second hand smoker when young. Heavy exposure.  Vaping Use   Vaping status: Never Used  Substance  and Sexual Activity   Alcohol use: Yes    Alcohol/week: 6.0 standard drinks of alcohol     Types: 6 Cans of beer per week    Comment: daily, couple of shots ( weekly depending on day)   Drug use: No   Sexual activity: Not Currently  Other Topics Concern   Not on file  Social History Narrative   Not on file   Social Determinants of Health   Financial Resource Strain: Not on file  Food Insecurity: Not on file  Transportation Needs: Not on file  Physical Activity: Not on file  Stress: Not on file  Social Connections: Not on file  Intimate Partner Violence: Not on file     PHYSICAL EXAM  Vitals:   05/20/23 1052  BP: (!) 147/95  Pulse: 84  Weight: 163 lb (73.9 kg)  Height: 5\' 5"  (1.651 m)    Body mass index is 27.12 kg/m.   General: The patient is well-developed and well-nourished and in no acute distress.     Neck/HEENT:   She has mild tenderness in the cervical paraspinal muscles.      Extremities:  Mild pedal edema   Neurologic Exam  Mental status: The patient is alert and oriented x 3 at the time of the examination. The patient has apparent normal recent and remote memory, with an apparently normal attention span and concentration ability.   Speech is normal.  Cranial nerves: Extraocular movements are full.  . She reports reduced sensation in the right face.  Facial strength was normal.  The trapezius strength is normal.      Motor:  Muscle bulk is normal.   Tone is normal in arms and mildly increased in the legs..  Strength is 5/5 in the arms and legs.  Sensory: She has symmetric sensation to touch and vibration in the arms and legs/feet.  Coordination: Cerebellar testing shows good finger-nose-finger and right heel-to-shin but mildly reduced left heel-to-shin (but has knee pain).  Gait and station: Station is normal.  The gait is wide and arthritic.   She is unable to do a tandem gait.   Romberg is negative..  Reflexes: Deep tendon reflexes are symmetric and mildly increased in legs bilaterally.   No ankle clonus      ASSESSMENT AND  PLAN  Relapsing remitting multiple sclerosis (HCC) - Plan: MR BRAIN WO CONTRAST  High risk medication use  Ataxic gait - Plan: MR BRAIN WO CONTRAST  History of alcohol abuse  Other fatigue  Urinary urgency   1.    For MS, continue Ocrevus, next infusion in March 2025.  IgG/IgM were fine 03/2023. 2.    Continue baclofen, Provigil and oxybutynin. 3.    MRI brain to determine if any progression and consider a different DMT if occurring 4.   We discussed a grab bar for her shower. 5.   She will return to see [place] in 6 months or sooner if she has new or worsening neurologic symptoms.    Kiaraliz Rafuse A. Epimenio Foot, MD, PhD, FAAN Certified in Neurology, Clinical Neurophysiology, Sleep Medicine, Pain Medicine and Neuroimaging Director, [facility] at [facility]  [facility] [address], [address] [place], [place] [address] [phone]

## 2023-05-29 ENCOUNTER — Ambulatory Visit: Payer: Federal, State, Local not specified - PPO | Admitting: Physical Therapy

## 2023-06-07 ENCOUNTER — Encounter: Payer: Self-pay | Admitting: Medical

## 2023-06-07 NOTE — Telephone Encounter (Signed)
Please advise referral for GI closed

## 2023-06-09 ENCOUNTER — Ambulatory Visit
Admission: EM | Admit: 2023-06-09 | Discharge: 2023-06-09 | Disposition: A | Discharge status: Home / Self Care | Payer: Federal, State, Local not specified - PPO | Attending: Internal Medicine | Admitting: Internal Medicine

## 2023-06-09 ENCOUNTER — Encounter: Payer: Self-pay | Admitting: Emergency Medicine

## 2023-06-09 DIAGNOSIS — G8929 Other chronic pain: Secondary | ICD-10-CM

## 2023-06-09 DIAGNOSIS — M25562 Pain in left knee: Secondary | ICD-10-CM

## 2023-06-09 MED ORDER — KETOROLAC TROMETHAMINE 30 MG/ML IJ SOLN
30.0000 mg | Freq: Once | INTRAMUSCULAR | Status: AC
  Administered 2023-06-09: 30 mg via INTRAMUSCULAR

## 2023-06-09 NOTE — Discharge Instructions (Addendum)
You were given a Toradol injection in clinic today. Do not take any over the counter NSAID's such as Advil, ibuprofen, Aleve, or naproxen for 24 hours. You may take tylenol if needed.  Ace wrap to the knee as needed for pain/swelling and support of the joint.  Please elevate and ice as you needed.  Please follow-up with your PCP or orthopedist in 2 to 3 days for recheck.  Please go to the ER for any worsening symptoms.  I hope you feel better soon!

## 2023-06-09 NOTE — ED Provider Notes (Signed)
[facility]    CSN: [id number] Arrival date & time: 06/09/23  1020      History   Chief Complaint Chief Complaint  Patient presents with   Knee Pain    HPI Catherine Hubbard is a 62 y.o. female presents for evaluation of knee pain.  Patient states she has chronic bilateral knee pain since a fall about 8 months ago.  She has been following with her PCP/orthopedics for this and is also currently in physical therapy.  She states 5 days ago she got out of bed and when she went to put weight on her left knee she developed pain.  She states it seems to be improving but she does have sharp pain with certain movements.  No pain at rest.  Denies any injury such as fall.  No bruising, swelling, numbness or tingling.  No history of surgeries to the knee in the past.  She did have an x-ray of her knee in July 2024 showing no fracture with degenerative arthropathic changes primarily involving the patellofemoral joint space compartment.  She has been taking ibuprofen, using topical Voltaren, and heat with minimal improvement.  She has been using her walker/cane for ambulation.  No other concerns at this time   Knee Pain   Past Medical History:  Diagnosis Date   Alcohol abuse    Asthma    Esophagitis    GERD (gastroesophageal reflux disease)    Headache    Hearing loss    Hypertension    Multiple sclerosis (HCC)    Pancreatitis    Vision abnormalities     Patient Active Problem List   Diagnosis Date Noted   Primary osteoarthritis of both knees 11/29/2022   Hematoma 11/29/2022   Post-traumatic osteoarthritis of right ankle 11/29/2022   Impingement syndrome of left ankle 11/29/2022   Iron deficiency anemia 11/29/2022   Compression fracture of L1 lumbar vertebra (HCC) 11/29/2022   Estrogen deficiency 11/29/2022   Skin tear of forearm without complication, subsequent encounter 04/06/2022   Hyponatremia 03/28/2022   Frequent falls 03/28/2022   Hypomagnesemia 03/28/2022    Alcohol abuse 03/28/2022   GERD without esophagitis 03/28/2022   History of alcohol abuse 12/02/2019   Multiple rib fractures 04/14/2019   Multiple fractures of ribs, bilateral, initial encounter for closed fracture 04/14/2019   Closed 3-part fracture of proximal humerus with routine healing 04/01/2019   Multiple sclerosis exacerbation (HCC) 11/16/2018   Exacerbation of multiple sclerosis (HCC) 11/16/2018   Elevated troponin 09/26/2018   Acute hypokalemia 09/25/2018   Arthritis of carpometacarpal joint 03/27/2018   Nocturnal leg cramps 03/26/2018   Syncope due to orthostatic hypotension 02/12/2018   Diarrhea 02/08/2018   Urinary tract infection without hematuria 02/08/2018   Hematoma of scalp 02/08/2018   Neck pain 01/20/2018   Foreign body in esophagus    Esophageal stricture    Intractable nausea and vomiting 01/31/2017   Dysphagia    Severe protein-calorie malnutrition (HCC)    Potassium disorder 01/30/2017   Cystitis 01/30/2017   Costochondritis 05/11/2016   Trochanteric bursitis of both hips 03/28/2016   Mild intermittent asthma without complication 11/10/2015   Bilateral low back pain with bilateral sciatica 07/13/2015   Lumbar radicular pain 03/21/2015   Other fatigue 01/11/2015   Dysesthesia 01/11/2015   Urinary urgency 01/11/2015   Relapsing remitting multiple sclerosis (HCC) 10/06/2014   Numbness 10/06/2014   Ataxic gait 10/06/2014   High risk medication use 10/06/2014   Gastric bypass status for obesity 10/06/2014  Cognitive changes 10/06/2014   Depression with anxiety 10/06/2014   Restless leg 10/06/2014   Insomnia 10/06/2014   Essential hypertension 10/06/2014   Difficulty hearing 01/13/2014   Other muscle spasm 01/13/2014    Past Surgical History:  Procedure Laterality Date   ANKLE FRACTURE SURGERY Bilateral    CARPAL TUNNEL RELEASE Bilateral    CHOLECYSTECTOMY     COLONOSCOPY  11/2016   multiple   ESOPHAGOGASTRODUODENOSCOPY  11/2016    ESOPHAGOGASTRODUODENOSCOPY N/A 02/01/2017   Procedure: ESOPHAGOGASTRODUODENOSCOPY (EGD);  Surgeon: Iva Boop, MD;  Location: [facility] ENDOSCOPY;  Service: Endoscopy;  Laterality: N/A;   ESOPHAGOGASTRODUODENOSCOPY (EGD) WITH PROPOFOL N/A 02/04/2017   Procedure: ESOPHAGOGASTRODUODENOSCOPY (EGD) WITH PROPOFOL;  Surgeon: Meryl Dare, MD;  Location: [facility] ENDOSCOPY;  Service: Endoscopy;  Laterality: N/A;   LAPAROSCOPIC GASTRIC SLEEVE RESECTION  2011   ULNAR NERVE REPAIR Bilateral     OB History   No obstetric history on file.      Home Medications    Prior to Admission medications   Medication Sig Start Date End Date Taking? Authorizing Provider  acetaminophen (TYLENOL) 500 MG tablet Take 500 mg by mouth daily as needed (pain).    [provider]  albuterol (PROVENTIL) (2.5 MG/3ML) 0.083% nebulizer solution Take 3 mLs (2.5 mg total) by nebulization every 6 (six) hours as needed for wheezing or shortness of breath. Patient taking differently: Take 2.5 mg by nebulization as needed for wheezing or shortness of breath. 11/26/19   Saguier, Ramon Dredge, PA-C  albuterol (VENTOLIN HFA) 108 (90 Base) MCG/ACT inhaler Inhale into the lungs. 06/29/16   [provider]  ALOE VERA JUICE PO Take by mouth. Patient not taking: Reported on 05/15/2023 01/13/14   [provider]  amitriptyline (ELAVIL) 10 MG tablet TAKE 1 TABLET BY MOUTH EVERYDAY AT BEDTIME 03/25/23   Raulkar, Drema Pry, MD  Ascorbic Acid (VITAMIN C PO) Take by mouth. 1 tablet daily    [provider]  Aspirin-Acetaminophen-Caffeine (EXCEDRIN PO) Take 1 tablet by mouth as needed (headache).    [provider]  B Complex Vitamins (VITAMIN B COMPLEX PO) Take by mouth. 1 daily    [provider]  baclofen (LIORESAL) 10 MG tablet Take 1 tablet (10 mg total) by mouth 3 (three) times daily as needed for muscle spasms. 05/01/22   Sater, Pearletha Furl, MD  bismuth subsalicylate (PEPTO BISMOL) 262 MG/15ML suspension  Take 30 mLs by mouth daily as needed (acid reflux).    [provider]  budesonide-formoterol (SYMBICORT) 80-4.5 MCG/ACT inhaler TAKE 2 PUFFS BY MOUTH TWICE A DAY 08/06/22   Saguier, Ramon Dredge, PA-C  Calcium Carbonate Antacid (TUMS PO) Take 2 tablets by mouth 2 (two) times daily as needed (acid reflux).    [provider]  chlorthalidone (HYGROTON) 25 MG tablet Take by mouth. Patient not taking: Reported on 05/20/2023 04/25/18   [provider]  Cholecalciferol (D2000 ULTRA STRENGTH) 50 MCG (2000 UT) CAPS Take 2,000 Units by mouth daily.    [provider]  Cyanocobalamin 1000 MCG TBCR Take by mouth. Patient not taking: Reported on 05/20/2023 01/13/14   [provider]  diclofenac Sodium (VOLTAREN) 1 % GEL Apply 4 g topically 4 (four) times daily. Patient not taking: Reported on 05/20/2023 06/21/22   Horton Chin, MD  DULoxetine (CYMBALTA) 60 MG capsule Take by mouth. Patient not taking: Reported on 05/20/2023 08/26/18   [provider]  fluticasone (FLONASE) 50 MCG/ACT nasal spray SPRAY 2 SPRAYS INTO EACH NOSTRIL EVERY DAY 10/23/22  Saguier, Ramon Dredge, PA-C  gabapentin (NEURONTIN) 100 MG capsule TAKE 1 CAPSULE BY MOUTH THREE TIMES A DAY 10/09/22   Saguier, Ramon Dredge, PA-C  IRON CR PO Take by mouth. 1 tablet daily    [provider]  Iron, Ferrous Sulfate, 325 (65 Fe) MG TABS 1 tab po bid 11/09/22   Saguier, Ramon Dredge, PA-C  meloxicam (MOBIC) 7.5 MG tablet Take 1 tablet (7.5 mg total) by mouth daily. 12/17/22   Raulkar, Drema Pry, MD  modafinil (PROVIGIL) 200 MG tablet Take one po qAM and 1/2 po qPM 05/20/23   Sater, Pearletha Furl, MD  Multiple Vitamin (MULTIVITAMIN WITH MINERALS) TABS tablet Take 1 tablet by mouth daily.    [provider]  mupirocin ointment (BACTROBAN) 2 % Apply 1 Application topically 2 (two) times daily. Patient not taking: Reported on 05/20/2023 04/06/22   Zola Button, Grayling Congress, DO  nystatin cream (MYCOSTATIN) Apply 1  Application topically 2 (two) times daily. 10/11/22   Saguier, Ramon Dredge, PA-C  ocrelizumab (OCREVUS) 300 MG/10ML injection INFUSE 600MG  INTRAVENOUSLY AS DIRECTED EVERY 6 MONTHS. 04/16/23   Sater, Pearletha Furl, MD  olmesartan (BENICAR) 5 MG tablet 2 tab po q day 11/05/22   Saguier, Ramon Dredge, PA-C  omeprazole (PRILOSEC) 20 MG capsule Take 40 mg by mouth 2 (two) times daily before a meal. 07/09/20   [provider]  ondansetron (ZOFRAN) 4 MG tablet TAKE 1 TABLET BY MOUTH EVERY 8 HOURS AS NEEDED FOR NAUSEA AND VOMITING 01/22/23   Saguier, Ramon Dredge, PA-C  ondansetron (ZOFRAN-ODT) 8 MG disintegrating tablet Take by mouth. 11/28/16   [provider]  oxybutynin (DITROPAN) 5 MG tablet Take 1 tablet (5 mg total) by mouth 2 (two) times daily. 05/01/22   Sater, Pearletha Furl, MD  Probiotic Product (PROBIOTIC DAILY PO) Take by mouth. 1 daily    [provider]  Thiamine HCl (THIAMINE PO) Take by mouth. 01/13/14   [provider]    Family History Family History  Problem Relation Age of Onset   Cancer Mother    Stroke Mother    Cancer Father     Social History Social History   Tobacco Use   Smoking status: Never   Smokeless tobacco: Never   Tobacco comments:    second hand smoker when young. Heavy exposure.  Vaping Use   Vaping status: Never Used  Substance Use Topics   Alcohol use: Yes    Alcohol/week: 6.0 standard drinks of alcohol    Types: 6 Cans of beer per week    Comment: daily, couple of shots ( weekly depending on day)   Drug use: No     Allergies   Ibuprofen and Nsaids   Review of Systems Review of Systems  Musculoskeletal:        Chronic left knee pain     Physical Exam Triage Vital Signs ED Triage Vitals  Encounter Vitals Group     BP 06/09/23 1034 109/73     Systolic BP Percentile --      Diastolic BP Percentile --      Pulse Rate 06/09/23 1034 83     Resp 06/09/23 1034 18     Temp 06/09/23 1034 98 F (36.7 C)     Temp Source 06/09/23 1034  Oral     SpO2 06/09/23 1034 96 %     Weight --      Height --      Head Circumference --      Peak Flow --  Pain Score 06/09/23 1032 10     Pain Loc --      Pain Education --      Exclude from Growth Chart --    No data found.  Updated Vital Signs BP 109/73 (BP Location: Right Arm)   Pulse 83   Temp 98 F (36.7 C) (Oral)   Resp 18   LMP 08/13/2014 Comment: very few periods  SpO2 96%   Visual Acuity Right Eye Distance:   Left Eye Distance:   Bilateral Distance:    Right Eye Near:   Left Eye Near:    Bilateral Near:     Physical Exam Vitals and nursing note reviewed.  Constitutional:      General: She is not in acute distress.    Appearance: Normal appearance. She is not ill-appearing.  HENT:     Head: Normocephalic and atraumatic.  Eyes:     Pupils: Pupils are equal, round, and reactive to light.  Cardiovascular:     Rate and Rhythm: Normal rate.  Pulmonary:     Effort: Pulmonary effort is normal.  Musculoskeletal:     Left knee: No swelling, deformity, effusion, erythema, ecchymosis, lacerations, bony tenderness or crepitus. Normal range of motion. Tenderness present over the medial joint line, lateral joint line and patellar tendon. Normal alignment and normal patellar mobility. Normal pulse.     Comments: Positive varus and valgus stress test.  Full extension of knee with pain with discomfort.  Skin:    General: Skin is warm and dry.  Neurological:     General: No focal deficit present.     Mental Status: She is alert and oriented to person, place, and time.  Psychiatric:        Mood and Affect: Mood normal.        Behavior: Behavior normal.      UC Treatments / Results  Labs (all labs ordered are listed, but only abnormal results are displayed) Labs Reviewed - No data to display ontains abnormal data Comp Met (CMET) Order: [id number] Status: Final result     Visible to patient: Yes (seen)     Next appt: 06/12/2023 at 11:00 AM in  Rehabilitation (RIDDLES,JULIE, PT)     Dx: Pedal edema; Hypertension, unspecifie...   0 Result Notes     3 Patient Communications          Component Ref Range & Units 1 mo ago 6 mo ago 7 mo ago 8 mo ago 9 mo ago 10 mo ago 1 yr ago  Sodium 135 - 145 mEq/L 139 141 R 138 139 137 132 Low  135  Potassium 3.5 - 5.1 mEq/L 4.6 3.7 R 4.4 4.5 4.2 4.1 4.6  Chloride 96 - 112 mEq/L 97 97 Low  R 96 99 99 89 Low  98  CO2 19 - 32 mEq/L 33 High  35 High  R 31 32 27 35 High  29  Glucose, Bld 70 - 99 mg/dL 95 91 CM 91 86 92 89 97  BUN 6 - 23 mg/dL 8 14 R 11 14 5  Low  8 9  Creatinine, Ser 0.40 - 1.20 mg/dL 4.09 8.11 R 9.14 7.82 9.56 0.60 0.76  Total Bilirubin 0.2 - 1.2 mg/dL 1.2 0.5 R 0.7 0.6 0.6 0.8 0.9  Alkaline Phosphatase 39 - 117 U/L 76 108 R 71 78 79 84 93  AST 0 - 37 U/L 30 17 R 29 19 22 25 25   ALT 0 - 35 U/L 11 9 R  10 10 10 11 10   Total Protein 6.0 - 8.3 g/dL 6.6 6.5 R 6.6 6.5 6.7 6.5 6.8  Albumin 3.5 - 5.2 g/dL 4.0 3.9 R 3.9 4.1 4.0 4.0 4.2  GFR >60.00 mL/min 92.76  93.38 CM 94.45 CM 93.16 CM 96.42 CM 84.36 CM  Comment: Calculated using the CKD-EPI Creatinine Equation (2021)  Calcium 8.4 - 10.5 mg/dL 9.8 9.0 R 9.8 9.9 9.6 9.5 9.8  Resulting Agency [place] [facility] [place] [facility] [place] [facility] [place] [facility] [place] [facility] [place] [facility]         Specimen Collected: 04/15/23 16:49 Last Resulted: 04/16/23 10:51      EKG   Radiology No results found.  Procedures Procedures (including critical care time)  Medications Ordered in UC Medications  ketorolac (TORADOL) 30 MG/ML injection 30 mg (30 mg Intramuscular Given 06/09/23 1101)    Initial Impression / Assessment and Plan / UC Course  I have reviewed the triage vital signs and the nursing notes.  Pertinent labs & imaging results that were available during my care of the patient were reviewed by me and considered in my medical decision making (see chart for details).     I reviewed exam and  symptoms with patient.  No red flags.  Patient presenting with acute on chronic left knee pain.  No new injury.  She is already in physical therapy and following with orthopedics.  Will do Toradol injection for pain/inflammation.  She was monitored for 10 minutes after injection with no reaction noted and tolerated well.  She was instructed no NSAIDs for 24 hours and she verbalized understanding.  Will do Ace wrap for compression.  Discussed RICE therapy.  Patient to follow-up with her PCP or orthopedist in 2 to 3 days for recheck.  Strict ER precautions reviewed. Final Clinical Impressions(s) / UC Diagnoses   Final diagnoses:  Chronic pain of left knee     Discharge Instructions      You were given a Toradol injection in clinic today. Do not take any over the counter NSAID's such as Advil, ibuprofen, Aleve, or naproxen for 24 hours. You may take tylenol if needed.  Ace wrap to the knee as needed for pain/swelling and support of the joint.  Please elevate and ice as you needed.  Please follow-up with your PCP or orthopedist in 2 to 3 days for recheck.  Please go to the ER for any worsening symptoms.  I hope you feel better soon!      ED Prescriptions   None    PDMP not reviewed this encounter.   Radford Pax, NP 06/09/23 [phone]

## 2023-06-09 NOTE — ED Triage Notes (Signed)
Pt reports had issues with knees for "while". Esp after a fall, 8 months ago. Pt c/o left knee pain that is ongoing. Reports had cortisone injections and currently in physical therapy for her knee pain. Pt reports that pain is just worse, having trouble walking due to it dragging and having to use her cane or walker.  Using Voltaren, old muscle relaxer, ice and heat.

## 2023-06-12 ENCOUNTER — Other Ambulatory Visit: Payer: Self-pay

## 2023-06-12 ENCOUNTER — Emergency Department (HOSPITAL_BASED_OUTPATIENT_CLINIC_OR_DEPARTMENT_OTHER)
Admission: EM | Admit: 2023-06-12 | Discharge: 2023-06-12 | Disposition: A | Discharge status: Home / Self Care | Payer: Federal, State, Local not specified - PPO | Attending: Emergency Medicine | Admitting: Emergency Medicine

## 2023-06-12 ENCOUNTER — Emergency Department (HOSPITAL_BASED_OUTPATIENT_CLINIC_OR_DEPARTMENT_OTHER): Payer: Federal, State, Local not specified - PPO

## 2023-06-12 ENCOUNTER — Ambulatory Visit: Payer: Federal, State, Local not specified - PPO | Attending: Sports Medicine | Admitting: Physical Therapy

## 2023-06-12 ENCOUNTER — Encounter (HOSPITAL_BASED_OUTPATIENT_CLINIC_OR_DEPARTMENT_OTHER): Payer: Self-pay | Admitting: Emergency Medicine

## 2023-06-12 DIAGNOSIS — M85862 Other specified disorders of bone density and structure, left lower leg: Secondary | ICD-10-CM | POA: Diagnosis not present

## 2023-06-12 DIAGNOSIS — M25462 Effusion, left knee: Secondary | ICD-10-CM | POA: Diagnosis not present

## 2023-06-12 DIAGNOSIS — Z7951 Long term (current) use of inhaled steroids: Secondary | ICD-10-CM | POA: Insufficient documentation

## 2023-06-12 DIAGNOSIS — I10 Essential (primary) hypertension: Secondary | ICD-10-CM | POA: Insufficient documentation

## 2023-06-12 DIAGNOSIS — J4520 Mild intermittent asthma, uncomplicated: Secondary | ICD-10-CM | POA: Diagnosis not present

## 2023-06-12 DIAGNOSIS — M25562 Pain in left knee: Secondary | ICD-10-CM | POA: Diagnosis not present

## 2023-06-12 DIAGNOSIS — M1712 Unilateral primary osteoarthritis, left knee: Secondary | ICD-10-CM | POA: Diagnosis not present

## 2023-06-12 DIAGNOSIS — G8929 Other chronic pain: Secondary | ICD-10-CM | POA: Diagnosis not present

## 2023-06-12 MED ORDER — ACETAMINOPHEN 500 MG PO TABS
1000.0000 mg | ORAL_TABLET | Freq: Once | ORAL | Status: AC
  Administered 2023-06-12: 1000 mg via ORAL
  Filled 2023-06-12: qty 2

## 2023-06-12 MED ORDER — KETOROLAC TROMETHAMINE 60 MG/2ML IM SOLN
15.0000 mg | Freq: Once | INTRAMUSCULAR | Status: AC
  Administered 2023-06-12: 15 mg via INTRAMUSCULAR
  Filled 2023-06-12: qty 2

## 2023-06-12 NOTE — ED Triage Notes (Signed)
Pt states left knee pain has had problems and has been seen/treated for, states was feeling better then got worse, seen at [facility] on Sunday, treated with some relief.

## 2023-06-12 NOTE — ED Notes (Signed)
Patient did not want d/c vital signs taken.

## 2023-06-12 NOTE — ED Provider Notes (Signed)
[facility] Provider Note  CSN: [id number] Arrival date & time: 06/12/23 0155  Chief Complaint(s) Knee Pain  HPI Catherine Hubbard is a 62 y.o. female with past medical history as below, significant for chronic knee pain, alcohol abuse, esophagitis, osteoarthritis, depression anxiety, MS who presents to the ED with complaint of left knee pain  She reports recurrent knee pain, reports she has seen sports medicine in the past and they typically give her a shot due to injection she feels better for few months and the pain comes back.  Denies any recent falls or injuries.  No fevers or chills.  No IV drug use.  She was seen in urgent care few days ago, given Toradol which did improve her symptoms.  Symptoms have gradually returned over the last couple days.  She is taking OTC Tylenol and Motrin intermittently for the pain.  Observed walking to treatment area from triage with steady gait  Past Medical History Past Medical History:  Diagnosis Date   Alcohol abuse    Asthma    Esophagitis    GERD (gastroesophageal reflux disease)    Headache    Hearing loss    Hypertension    Multiple sclerosis (HCC)    Pancreatitis    Vision abnormalities    Patient Active Problem List   Diagnosis Date Noted   Primary osteoarthritis of both knees 11/29/2022   Hematoma 11/29/2022   Post-traumatic osteoarthritis of right ankle 11/29/2022   Impingement syndrome of left ankle 11/29/2022   Iron deficiency anemia 11/29/2022   Compression fracture of L1 lumbar vertebra (HCC) 11/29/2022   Estrogen deficiency 11/29/2022   Skin tear of forearm without complication, subsequent encounter 04/06/2022   Hyponatremia 03/28/2022   Frequent falls 03/28/2022   Hypomagnesemia 03/28/2022   Alcohol abuse 03/28/2022   GERD without esophagitis 03/28/2022   History of alcohol abuse 12/02/2019   Multiple rib fractures 04/14/2019   Multiple fractures of ribs, bilateral,  initial encounter for closed fracture 04/14/2019   Closed 3-part fracture of proximal humerus with routine healing 04/01/2019   Multiple sclerosis exacerbation (HCC) 11/16/2018   Exacerbation of multiple sclerosis (HCC) 11/16/2018   Elevated troponin 09/26/2018   Acute hypokalemia 09/25/2018   Arthritis of carpometacarpal joint 03/27/2018   Nocturnal leg cramps 03/26/2018   Syncope due to orthostatic hypotension 02/12/2018   Diarrhea 02/08/2018   Urinary tract infection without hematuria 02/08/2018   Hematoma of scalp 02/08/2018   Neck pain 01/20/2018   Foreign body in esophagus    Esophageal stricture    Intractable nausea and vomiting 01/31/2017   Dysphagia    Severe protein-calorie malnutrition (HCC)    Potassium disorder 01/30/2017   Cystitis 01/30/2017   Costochondritis 05/11/2016   Trochanteric bursitis of both hips 03/28/2016   Mild intermittent asthma without complication 11/10/2015   Bilateral low back pain with bilateral sciatica 07/13/2015   Lumbar radicular pain 03/21/2015   Other fatigue 01/11/2015   Dysesthesia 01/11/2015   Urinary urgency 01/11/2015   Relapsing remitting multiple sclerosis (HCC) 10/06/2014   Numbness 10/06/2014   Ataxic gait 10/06/2014   High risk medication use 10/06/2014   Gastric bypass status for obesity 10/06/2014   Cognitive changes 10/06/2014   Depression with anxiety 10/06/2014   Restless leg 10/06/2014   Insomnia 10/06/2014   Essential hypertension 10/06/2014   Difficulty hearing 01/13/2014   Other muscle spasm 01/13/2014   Home Medication(s) Prior to Admission medications   Medication Sig Start Date End Date  Taking? Authorizing Provider  acetaminophen (TYLENOL) 500 MG tablet Take 500 mg by mouth daily as needed (pain).    [provider]  albuterol (PROVENTIL) (2.5 MG/3ML) 0.083% nebulizer solution Take 3 mLs (2.5 mg total) by nebulization every 6 (six) hours as needed for wheezing or shortness of breath. Patient taking  differently: Take 2.5 mg by nebulization as needed for wheezing or shortness of breath. 11/26/19   Saguier, Ramon Dredge, PA-C  albuterol (VENTOLIN HFA) 108 (90 Base) MCG/ACT inhaler Inhale into the lungs. 06/29/16   [provider]  ALOE VERA JUICE PO Take by mouth. Patient not taking: Reported on 05/15/2023 01/13/14   [provider]  amitriptyline (ELAVIL) 10 MG tablet TAKE 1 TABLET BY MOUTH EVERYDAY AT BEDTIME 03/25/23   Raulkar, Drema Pry, MD  Ascorbic Acid (VITAMIN C PO) Take by mouth. 1 tablet daily    [provider]  Aspirin-Acetaminophen-Caffeine (EXCEDRIN PO) Take 1 tablet by mouth as needed (headache).    [provider]  B Complex Vitamins (VITAMIN B COMPLEX PO) Take by mouth. 1 daily    [provider]  baclofen (LIORESAL) 10 MG tablet Take 1 tablet (10 mg total) by mouth 3 (three) times daily as needed for muscle spasms. 05/01/22   Sater, Pearletha Furl, MD  bismuth subsalicylate (PEPTO BISMOL) 262 MG/15ML suspension Take 30 mLs by mouth daily as needed (acid reflux).    [provider]  budesonide-formoterol (SYMBICORT) 80-4.5 MCG/ACT inhaler TAKE 2 PUFFS BY MOUTH TWICE A DAY 08/06/22   Saguier, Ramon Dredge, PA-C  Calcium Carbonate Antacid (TUMS PO) Take 2 tablets by mouth 2 (two) times daily as needed (acid reflux).    [provider]  chlorthalidone (HYGROTON) 25 MG tablet Take by mouth. Patient not taking: Reported on 05/20/2023 04/25/18   [provider]  Cholecalciferol (D2000 ULTRA STRENGTH) 50 MCG (2000 UT) CAPS Take 2,000 Units by mouth daily.    [provider]  Cyanocobalamin 1000 MCG TBCR Take by mouth. Patient not taking: Reported on 05/20/2023 01/13/14   [provider]  diclofenac Sodium (VOLTAREN) 1 % GEL Apply 4 g topically 4 (four) times daily. Patient not taking: Reported on 05/20/2023 06/21/22   Horton Chin, MD  DULoxetine (CYMBALTA) 60 MG capsule Take by mouth. Patient not taking: Reported on  05/20/2023 08/26/18   [provider]  fluticasone (FLONASE) 50 MCG/ACT nasal spray SPRAY 2 SPRAYS INTO EACH NOSTRIL EVERY DAY 10/23/22   Saguier, Ramon Dredge, PA-C  gabapentin (NEURONTIN) 100 MG capsule TAKE 1 CAPSULE BY MOUTH THREE TIMES A DAY 10/09/22   Saguier, Ramon Dredge, PA-C  IRON CR PO Take by mouth. 1 tablet daily    [provider]  Iron, Ferrous Sulfate, 325 (65 Fe) MG TABS 1 tab po bid 11/09/22   Saguier, Ramon Dredge, PA-C  meloxicam (MOBIC) 7.5 MG tablet Take 1 tablet (7.5 mg total) by mouth daily. 12/17/22   Raulkar, Drema Pry, MD  modafinil (PROVIGIL) 200 MG tablet Take one po qAM and 1/2 po qPM 05/20/23   Sater, Pearletha Furl, MD  Multiple Vitamin (MULTIVITAMIN WITH MINERALS) TABS tablet Take 1 tablet by mouth daily.    [provider]  mupirocin ointment (BACTROBAN) 2 % Apply 1 Application topically 2 (two) times daily. Patient not taking: Reported on 05/20/2023 04/06/22   Zola Button, Grayling Congress, DO  nystatin cream (MYCOSTATIN) Apply 1 Application topically 2 (two) times daily. 10/11/22   Saguier, Ramon Dredge, PA-C  ocrelizumab (OCREVUS) 300 MG/10ML injection INFUSE 600MG  INTRAVENOUSLY AS DIRECTED EVERY  6 MONTHS. 04/16/23   Sater, Pearletha Furl, MD  olmesartan (BENICAR) 5 MG tablet 2 tab po q day 11/05/22   Saguier, Ramon Dredge, PA-C  omeprazole (PRILOSEC) 20 MG capsule Take 40 mg by mouth 2 (two) times daily before a meal. 07/09/20   [provider]  ondansetron (ZOFRAN) 4 MG tablet TAKE 1 TABLET BY MOUTH EVERY 8 HOURS AS NEEDED FOR NAUSEA AND VOMITING 01/22/23   Saguier, Ramon Dredge, PA-C  ondansetron (ZOFRAN-ODT) 8 MG disintegrating tablet Take by mouth. 11/28/16   [provider]  oxybutynin (DITROPAN) 5 MG tablet Take 1 tablet (5 mg total) by mouth 2 (two) times daily. 05/01/22   Sater, Pearletha Furl, MD  Probiotic Product (PROBIOTIC DAILY PO) Take by mouth. 1 daily    [provider]  Thiamine HCl (THIAMINE PO) Take by mouth. 01/13/14   [provider]                                                                                                                                     Past Surgical History Past Surgical History:  Procedure Laterality Date   ANKLE FRACTURE SURGERY Bilateral    CARPAL TUNNEL RELEASE Bilateral    CHOLECYSTECTOMY     COLONOSCOPY  11/2016   multiple   ESOPHAGOGASTRODUODENOSCOPY  11/2016   ESOPHAGOGASTRODUODENOSCOPY N/A 02/01/2017   Procedure: ESOPHAGOGASTRODUODENOSCOPY (EGD);  Surgeon: Iva Boop, MD;  Location[facility] ENDOSCOPY;  Service: Endoscopy;  Laterality: N/A;   ESOPHAGOGASTRODUODENOSCOPY (EGD) WITH PROPOFOL N/A 02/04/2017   Procedure: ESOPHAGOGASTRODUODENOSCOPY (EGD) WITH PROPOFOL;  Surgeon: Meryl Dare, MD;  Location: [facility] ENDOSCOPY;  Service: Endoscopy;  Laterality: N/A;   LAPAROSCOPIC GASTRIC SLEEVE RESECTION  2011   ULNAR NERVE REPAIR Bilateral    Family History Family History  Problem Relation Age of Onset   Cancer Mother    Stroke Mother    Cancer Father     Social History Social History   Tobacco Use   Smoking status: Never   Smokeless tobacco: Never   Tobacco comments:    second hand smoker when young. Heavy exposure.  Vaping Use   Vaping status: Never Used  Substance Use Topics   Alcohol use: Yes    Alcohol/week: 6.0 standard drinks of alcohol    Types: 6 Cans of beer per week    Comment: daily, couple of shots ( weekly depending on day)   Drug use: No   Allergies Ibuprofen and Nsaids  Review of Systems Review of Systems  Constitutional:  Negative for chills and fever.  Respiratory:  Negative for shortness of breath.   Cardiovascular:  Negative for chest pain.  Gastrointestinal:  Negative for abdominal pain and vomiting.  Musculoskeletal:  Positive for arthralgias.  Skin:  Negative for wound.  All other systems reviewed and are negative.   Physical Exam Vital Signs  I have reviewed the triage vital signs BP 138/87 (BP Location: Right Arm)   Pulse  73   Temp 98.7 F (37.1 C) (Oral)    Resp 20   Ht 5\' 5"  (1.651 m)   Wt 78.9 kg   LMP 08/13/2014 Comment: very few periods  SpO2 99%   BMI 28.96 kg/m  Physical Exam Vitals and nursing note reviewed.  Constitutional:      General: She is not in acute distress.    Appearance: Normal appearance. She is well-developed. She is obese. She is not ill-appearing.  HENT:     Head: Normocephalic and atraumatic.     Right Ear: External ear normal.     Left Ear: External ear normal.     Nose: Nose normal.     Mouth/Throat:     Mouth: Mucous membranes are moist.  Eyes:     General: No scleral icterus.       Right eye: No discharge.        Left eye: No discharge.  Cardiovascular:     Rate and Rhythm: Normal rate.  Pulmonary:     Effort: Pulmonary effort is normal. No respiratory distress.     Breath sounds: No stridor.  Abdominal:     General: Abdomen is flat. There is no distension.     Tenderness: There is no guarding.  Musculoskeletal:        General: No deformity.     Cervical back: No rigidity.       Legs:     Comments: Quadricep, patella, Achilles tendon intact bilateral. LE NVI Full range of motion noted to left knee  Skin:    General: Skin is warm and dry.     Coloration: Skin is not cyanotic, jaundiced or pale.  Neurological:     Mental Status: She is alert and oriented to person, place, and time.     GCS: GCS eye subscore is 4. GCS verbal subscore is 5. GCS motor subscore is 6.  Psychiatric:        Speech: Speech normal.        Behavior: Behavior normal. Behavior is cooperative.     ED Results and Treatments Labs (all labs ordered are listed, but only abnormal results are displayed) Labs Reviewed - No data to display                                                                                                                        Radiology DG Knee Complete 4 Views Left  Result Date: 06/12/2023 CLINICAL DATA:  Knee pain. EXAM: LEFT KNEE - COMPLETE 4+ VIEW COMPARISON:  03/13/2023 FINDINGS:  Bones appear osteopenic. No acute fracture or dislocation. There is a suprapatellar joint effusion. This is new compared with the previous exam. Mild tricompartment osteoarthritis identified IMPRESSION: 1. No acute fracture or dislocation. 2. Suprapatellar joint effusion. 3. Mild tricompartment osteoarthritis. 4. Osteopenia Electronically Signed   By: Signa Kell M.D.   On: 06/12/2023 05:27    Pertinent labs & imaging results that were available during my care of the patient were reviewed  by me and considered in my medical decision making (see MDM for details).  Medications Ordered in ED Medications  ketorolac (TORADOL) injection 15 mg (15 mg Intramuscular Given 06/12/23 0457)  acetaminophen (TYLENOL) tablet 1,000 mg (1,000 mg Oral Given 06/12/23 0456)                                                                                                                                     Procedures Procedures  (including critical care time)  Medical Decision Making / ED Course    Medical Decision Making:    IYANNA BAKARE is a 62 y.o. female with past medical history as below, significant for chronic knee pain, alcohol abuse, esophagitis, osteoarthritis, depression anxiety, MS who presents to the ED with complaint of left knee pain. The complaint involves an extensive differential diagnosis and also carries with it a high risk of complications and morbidity.  Serious etiology was considered. Ddx includes but is not limited to: Sprain, strain, soft tissue injury, septic joint, bursitis, ligamentous injury, tendon injury, etc.  Complete initial physical exam performed, notably the patient  was no acute distress, sitting upright, afebrile, HDS.    Reviewed and confirmed nursing documentation for past medical history, family history, social history.  Vital signs reviewed.    Clinical Course as of 06/12/23 0650  Wed Jun 12, 2023  0452 Gait steady [SG]  0548 XR stable, no fx/dislocation   [SG]    Clinical Course User Index [SG] Sloan Leiter, DO     Exam is reassuring, no evidence of infection to the affected joint.  LE NVI.  X-ray without acute fracture or dislocation.  Gait steady.  Given knee sleeve, analgesics, follow-up sports medicine.  Acute on chronic knee pain.  Patient requesting narcotic pain medication, advised her to follow with PCP/sports medicine  The patient improved significantly and was discharged in stable condition. Detailed discussions were had with the patient regarding current findings, and need for close f/u with PCP or on call doctor. The patient has been instructed to return immediately if the symptoms worsen in any way for re-evaluation. Patient verbalized understanding and is in agreement with current care plan. All questions answered prior to discharge.                   Additional history obtained: -Additional history obtained from spouse -External records from outside source obtained and reviewed including: Chart review including previous notes, labs, imaging, consultation notes including  Primary care recommendation, home medications   Lab Tests: na  EKG   EKG Interpretation Date/Time:    Ventricular Rate:    PR Interval:    QRS Duration:    QT Interval:    QTC Calculation:   R Axis:      Text Interpretation:           Imaging Studies ordered: I ordered imaging studies including knee xr I independently visualized  the following imaging with scope of interpretation limited to determining acute life threatening conditions related to emergency care; findings noted above I independently visualized and interpreted imaging. I agree with the radiologist interpretation   Medicines ordered and prescription drug management: Meds ordered this encounter  Medications   ketorolac (TORADOL) injection 15 mg   acetaminophen (TYLENOL) tablet 1,000 mg    -I have reviewed the patients home medicines and have made  adjustments as needed   Consultations Obtained: na   Cardiac Monitoring: Continuous pulse oximetry interpreted by myself, 99% on RA.    Social Determinants of Health:  Diagnosis or treatment significantly limited by social determinants of health: lives at home   Reevaluation: After the interventions noted above, I reevaluated the patient and found that they have improved  Co morbidities that complicate the patient evaluation  Past Medical History:  Diagnosis Date   Alcohol abuse    Asthma    Esophagitis    GERD (gastroesophageal reflux disease)    Headache    Hearing loss    Hypertension    Multiple sclerosis (HCC)    Pancreatitis    Vision abnormalities       Dispostion: Disposition decision including need for hospitalization was considered, and patient discharged from emergency department.    Final Clinical Impression(s) / ED Diagnoses Final diagnoses:  Chronic pain of left knee        Sloan Leiter, DO 06/12/23 [phone]

## 2023-06-12 NOTE — Discharge Instructions (Addendum)
It was a pleasure caring for you today in the emergency department.  Your x-ray shows arthritis, no signs of any fracture or dislocation  Please follow-up with sports medicine, you may take OTC acetaminophen or ibuprofen as needed for discomfort.   Please return to the emergency department for any worsening or worrisome symptoms.

## 2023-06-13 ENCOUNTER — Emergency Department (HOSPITAL_BASED_OUTPATIENT_CLINIC_OR_DEPARTMENT_OTHER): Payer: Federal, State, Local not specified - PPO

## 2023-06-13 ENCOUNTER — Encounter (HOSPITAL_BASED_OUTPATIENT_CLINIC_OR_DEPARTMENT_OTHER): Payer: Self-pay | Admitting: *Deleted

## 2023-06-13 ENCOUNTER — Emergency Department (HOSPITAL_BASED_OUTPATIENT_CLINIC_OR_DEPARTMENT_OTHER)
Admission: EM | Admit: 2023-06-13 | Discharge: 2023-06-13 | Disposition: A | Discharge status: Home / Self Care | Payer: Federal, State, Local not specified - PPO | Attending: Emergency Medicine | Admitting: Emergency Medicine

## 2023-06-13 DIAGNOSIS — F419 Anxiety disorder, unspecified: Secondary | ICD-10-CM | POA: Insufficient documentation

## 2023-06-13 DIAGNOSIS — M25462 Effusion, left knee: Secondary | ICD-10-CM | POA: Insufficient documentation

## 2023-06-13 DIAGNOSIS — M25562 Pain in left knee: Secondary | ICD-10-CM | POA: Diagnosis not present

## 2023-06-13 DIAGNOSIS — M1712 Unilateral primary osteoarthritis, left knee: Secondary | ICD-10-CM | POA: Diagnosis not present

## 2023-06-13 DIAGNOSIS — R600 Localized edema: Secondary | ICD-10-CM | POA: Insufficient documentation

## 2023-06-13 LAB — SYNOVIAL CELL COUNT + DIFF, W/ CRYSTALS
Crystals, Fluid: NONE SEEN
Other Cells-SYN: UNDETERMINED
WBC, Synovial: UNDETERMINED /mm3 (ref 0–200)

## 2023-06-13 MED ORDER — LIDOCAINE HCL (PF) 1 % IJ SOLN
30.0000 mL | Freq: Once | INTRAMUSCULAR | Status: AC
  Administered 2023-06-13: 10 mL
  Filled 2023-06-13: qty 30

## 2023-06-13 NOTE — ED Provider Notes (Signed)
[place] [facility] AT [facility] Provider Note   CSN: [id number] Arrival date & time: 06/13/23  0016     History  Chief Complaint  Patient presents with   Knee Pain    Catherine Hubbard is a 62 y.o. female.  The history is provided by the patient.  Patient with multiple medical conditions presents with ongoing left knee pain.  She has had knee pain for quite some time, but worse over the past several days.  She has been seen twice for this pain over the past week.  It is not improving.  No recent trauma.     Home Medications Prior to Admission medications   Medication Sig Start Date End Date Taking? Authorizing Provider  acetaminophen (TYLENOL) 500 MG tablet Take 500 mg by mouth daily as needed (pain).    [provider]  albuterol (PROVENTIL) (2.5 MG/3ML) 0.083% nebulizer solution Take 3 mLs (2.5 mg total) by nebulization every 6 (six) hours as needed for wheezing or shortness of breath. Patient taking differently: Take 2.5 mg by nebulization as needed for wheezing or shortness of breath. 11/26/19   Saguier, Ramon Dredge, PA-C  albuterol (VENTOLIN HFA) 108 (90 Base) MCG/ACT inhaler Inhale into the lungs. 06/29/16   [provider]  ALOE VERA JUICE PO Take by mouth. Patient not taking: Reported on 05/15/2023 01/13/14   [provider]  amitriptyline (ELAVIL) 10 MG tablet TAKE 1 TABLET BY MOUTH EVERYDAY AT BEDTIME 03/25/23   Raulkar, Drema Pry, MD  Ascorbic Acid (VITAMIN C PO) Take by mouth. 1 tablet daily    [provider]  Aspirin-Acetaminophen-Caffeine (EXCEDRIN PO) Take 1 tablet by mouth as needed (headache).    [provider]  B Complex Vitamins (VITAMIN B COMPLEX PO) Take by mouth. 1 daily    [provider]  baclofen (LIORESAL) 10 MG tablet Take 1 tablet (10 mg total) by mouth 3 (three) times daily as needed for muscle spasms. 05/01/22   Sater, Pearletha Furl, MD  bismuth subsalicylate (PEPTO BISMOL) 262  MG/15ML suspension Take 30 mLs by mouth daily as needed (acid reflux).    [provider]  budesonide-formoterol (SYMBICORT) 80-4.5 MCG/ACT inhaler TAKE 2 PUFFS BY MOUTH TWICE A DAY 08/06/22   Saguier, Ramon Dredge, PA-C  Calcium Carbonate Antacid (TUMS PO) Take 2 tablets by mouth 2 (two) times daily as needed (acid reflux).    [provider]  chlorthalidone (HYGROTON) 25 MG tablet Take by mouth. Patient not taking: Reported on 05/20/2023 04/25/18   [provider]  Cholecalciferol (D2000 ULTRA STRENGTH) 50 MCG (2000 UT) CAPS Take 2,000 Units by mouth daily.    [provider]  Cyanocobalamin 1000 MCG TBCR Take by mouth. Patient not taking: Reported on 05/20/2023 01/13/14   [provider]  diclofenac Sodium (VOLTAREN) 1 % GEL Apply 4 g topically 4 (four) times daily. Patient not taking: Reported on 05/20/2023 06/21/22   Horton Chin, MD  DULoxetine (CYMBALTA) 60 MG capsule Take by mouth. Patient not taking: Reported on 05/20/2023 08/26/18   [provider]  fluticasone (FLONASE) 50 MCG/ACT nasal spray SPRAY 2 SPRAYS INTO EACH NOSTRIL EVERY DAY 10/23/22   Saguier, Ramon Dredge, PA-C  gabapentin (NEURONTIN) 100 MG capsule TAKE 1 CAPSULE BY MOUTH THREE TIMES A DAY 10/09/22   Saguier, Ramon Dredge, PA-C  IRON CR PO Take by mouth. 1 tablet daily    [provider]  Iron, Ferrous Sulfate, 325 (65 Fe) MG TABS 1 tab po bid 11/09/22   Saguier,  Ramon Dredge, PA-C  meloxicam (MOBIC) 7.5 MG tablet Take 1 tablet (7.5 mg total) by mouth daily. 12/17/22   Raulkar, Drema Pry, MD  modafinil (PROVIGIL) 200 MG tablet Take one po qAM and 1/2 po qPM 05/20/23   Sater, Pearletha Furl, MD  Multiple Vitamin (MULTIVITAMIN WITH MINERALS) TABS tablet Take 1 tablet by mouth daily.    [provider]  mupirocin ointment (BACTROBAN) 2 % Apply 1 Application topically 2 (two) times daily. Patient not taking: Reported on 05/20/2023 04/06/22   Zola Button, Grayling Congress, DO  nystatin cream (MYCOSTATIN)  Apply 1 Application topically 2 (two) times daily. 10/11/22   Saguier, Ramon Dredge, PA-C  ocrelizumab (OCREVUS) 300 MG/10ML injection INFUSE 600MG  INTRAVENOUSLY AS DIRECTED EVERY 6 MONTHS. 04/16/23   Sater, Pearletha Furl, MD  olmesartan (BENICAR) 5 MG tablet 2 tab po q day 11/05/22   Saguier, Ramon Dredge, PA-C  omeprazole (PRILOSEC) 20 MG capsule Take 40 mg by mouth 2 (two) times daily before a meal. 07/09/20   [provider]  ondansetron (ZOFRAN) 4 MG tablet TAKE 1 TABLET BY MOUTH EVERY 8 HOURS AS NEEDED FOR NAUSEA AND VOMITING 01/22/23   Saguier, Ramon Dredge, PA-C  ondansetron (ZOFRAN-ODT) 8 MG disintegrating tablet Take by mouth. 11/28/16   [provider]  oxybutynin (DITROPAN) 5 MG tablet Take 1 tablet (5 mg total) by mouth 2 (two) times daily. 05/01/22   Sater, Pearletha Furl, MD  Probiotic Product (PROBIOTIC DAILY PO) Take by mouth. 1 daily    [provider]  Thiamine HCl (THIAMINE PO) Take by mouth. 01/13/14   [provider]      Allergies    Ibuprofen and Nsaids    Review of Systems   Review of Systems  Constitutional:  Negative for fever.  Musculoskeletal:  Positive for arthralgias and joint swelling.    Physical Exam Updated Vital Signs BP 105/77   Pulse 64   Temp 98.2 F (36.8 C)   Resp 18   LMP 08/13/2014 Comment: very few periods  SpO2 96%  Physical Exam CONSTITUTIONAL: Elderly, disheveled, anxious HEAD: Normocephalic/atraumatic NEURO: Pt is awake/alert/appropriate, moves all extremitiesx4.  No facial droop.   EXTREMITIES: pulses normal/equal, full ROM Distal pulses equal and intact.  Chronic pitting edema to bilateral lower extremities Left knee has significant tenderness but no erythema, no crepitus.  Effusion is noted SKIN: warm, color normal PSYCH: Anxious  ED Results / Procedures / Treatments   Labs (all labs ordered are listed, but only abnormal results are displayed) Labs Reviewed  SYNOVIAL CELL COUNT + DIFF, W/ CRYSTALS - Abnormal; Notable for  the following components:      Result Value   Appearance-Synovial HAZY (*)    All other components within normal limits  BODY FLUID CULTURE W GRAM STAIN  GLUCOSE, BODY FLUID OTHER            PROTEIN, BODY FLUID (OTHER)    EKG None  Radiology DG Knee Left Port  Result Date: 06/13/2023 CLINICAL DATA:  Ongoing left knee pain. EXAM: PORTABLE LEFT KNEE - 1-2 VIEW COMPARISON:  06/12/2023 FINDINGS: Diffuse bone demineralization. Mild degenerative changes with medial greater than lateral compartment narrowing and small osteophyte formation. Small left knee effusion. No evidence of acute fracture or dislocation. No focal bone lesion or bone destruction. Surgical clips in the soft tissues. IMPRESSION: Mild degenerative changes in the left knee. Small effusion. No acute displaced fractures. Electronically Signed   By: Burman Nieves M.D.   On: 06/13/2023 03:25   DG Knee Complete  4 Views Left  Result Date: 06/12/2023 CLINICAL DATA:  Knee pain. EXAM: LEFT KNEE - COMPLETE 4+ VIEW COMPARISON:  03/13/2023 FINDINGS: Bones appear osteopenic. No acute fracture or dislocation. There is a suprapatellar joint effusion. This is new compared with the previous exam. Mild tricompartment osteoarthritis identified IMPRESSION: 1. No acute fracture or dislocation. 2. Suprapatellar joint effusion. 3. Mild tricompartment osteoarthritis. 4. Osteopenia Electronically Signed   By: Signa Kell M.D.   On: 06/12/2023 05:27    Procedures .Joint Aspiration/Arthrocentesis  Date/Time: 06/13/2023 3:35 AM  Performed by: Zadie Rhine, MD Authorized by: Zadie Rhine, MD   Consent:    Consent obtained:  Written and verbal   Consent given by:  Patient and spouse   Risks, benefits, and alternatives were discussed: yes     Risks discussed:  Bleeding, pain, infection and incomplete drainage   Alternatives discussed:  No treatment Universal protocol:    Patient identity confirmed:  Provided demographic data Location:     Location:  Knee   Knee:  L knee Anesthesia:    Anesthesia method:  Local infiltration   Local anesthetic:  Lidocaine 1% w/o epi Procedure details:    Preparation: Patient was prepped and draped in usual sterile fashion     Needle gauge:  18 G   Approach:  Medial   Aspirate characteristics:  Yellow   Specimen collected: yes   Post-procedure details:    Dressing:  Sterile dressing   Procedure completion:  Tolerated well, no immediate complications     Medications Ordered in ED Medications  lidocaine (PF) (XYLOCAINE) 1 % injection 30 mL (10 mLs Other Given 06/13/23 0330)    ED Course/ Medical Decision Making/ A&P Clinical Course as of 06/13/23 2258  Thu Jun 13, 2023  0242 I had a long discussion with the husband.  He reports she has had arthrocentesis in the past.  Patient agrees as well as the spouse to proceed with his procedure.  Advised that this is a chronic issue and that following up during daylight hours is recommended [DW]  7829 Patient tolerated arthrocentesis well.  Fluid was yellow in nature, strong suspicion this was a reactive process  Labs are still pending, but low suspicion this represents septic arthritis given long history of knee pain. [DW]    Clinical Course User Index [DW] Zadie Rhine, MD                                 Medical Decision Making Amount and/or Complexity of Data Reviewed Labs: ordered. Radiology: ordered.  Risk Prescription drug management.   This patient presents to the ED for concern of left knee pain, this involves an extensive number of treatment options, and is a complaint that carries with it a high risk of complications and morbidity.  The differential diagnosis includes but is not limited to sprain, occult fracture, septic arthritis, DVT, reactive effusion, gouty arthritis  Comorbidities that complicate the patient evaluation: Patient's presentation is complicated by their history of chronic knee pain and  obesity  Social Determinants of Health: Patient's  multiple ER visits   increases the complexity of managing their presentation  Additional history obtained: Additional history obtained from spouse Records reviewed  urgent care   Imaging Studies ordered: I ordered imaging studies including X-ray left knee   I independently visualized and interpreted imaging which showed no acute fracture I agree with the radiologist interpretation   Critical Interventions:  left knee arthrocentesis  Reevaluation: After the interventions noted above, I reevaluated the patient and found that they have :improved  Complexity of problems addressed: Patient's presentation is most consistent with  exacerbation of chronic illness  Disposition: After consideration of the diagnostic results and the patient's response to treatment,  I feel that the patent would benefit from discharge   .           Final Clinical Impression(s) / ED Diagnoses Final diagnoses:  Effusion of bursa of left knee    Rx / DC Orders ED Discharge Orders     None         Zadie Rhine, MD 06/13/23 2258

## 2023-06-13 NOTE — ED Triage Notes (Signed)
Pt is here for reevaluation of ongoing left knee pain. Pt was seen at [facility] on Sunday and at [facility] yesterday.  Pt is ambulatory with a walker.

## 2023-06-14 ENCOUNTER — Ambulatory Visit: Payer: Federal, State, Local not specified - PPO | Admitting: Physician Assistant

## 2023-06-14 ENCOUNTER — Other Ambulatory Visit: Payer: Self-pay

## 2023-06-14 ENCOUNTER — Emergency Department (HOSPITAL_COMMUNITY)
Admission: EM | Admit: 2023-06-14 | Discharge: 2023-06-14 | Disposition: A | Discharge status: Home / Self Care | Payer: Federal, State, Local not specified - PPO | Attending: Emergency Medicine | Admitting: Emergency Medicine

## 2023-06-14 ENCOUNTER — Encounter: Payer: Self-pay | Admitting: Physician Assistant

## 2023-06-14 VITALS — BP 129/89 | HR 81 | Temp 98.2°F | Ht 65.0 in | Wt 170.1 lb

## 2023-06-14 DIAGNOSIS — R600 Localized edema: Secondary | ICD-10-CM

## 2023-06-14 DIAGNOSIS — M25562 Pain in left knee: Secondary | ICD-10-CM

## 2023-06-14 DIAGNOSIS — G8929 Other chronic pain: Secondary | ICD-10-CM | POA: Diagnosis not present

## 2023-06-14 DIAGNOSIS — M25462 Effusion, left knee: Secondary | ICD-10-CM | POA: Diagnosis not present

## 2023-06-14 LAB — GLUCOSE, BODY FLUID OTHER

## 2023-06-14 LAB — PROTEIN, BODY FLUID (OTHER)

## 2023-06-14 MED ORDER — FUROSEMIDE 20 MG PO TABS: 20.0000 mg | ORAL_TABLET | Freq: Every day | ORAL | 0 refills | Status: DC

## 2023-06-14 NOTE — Telephone Encounter (Signed)
Please have pt follow from various ED visits for knee pain.

## 2023-06-14 NOTE — Patient Instructions (Addendum)
[phone] 

## 2023-06-14 NOTE — Progress Notes (Signed)
Established patient visit   Patient: Catherine Hubbard   DOB: [birth year]   62 y.o. Female  MRN: [id number] Visit Date: 06/14/2023  Today's healthcare provider: Alfredia Ferguson, PA-C   Cc. L knee pain  Subjective     Pt has been seen several times over the last week in the ED for left knee pain: 10/20, 10/23, 10/24, and today.   10/24 an effusion was drained , no crystals seen, culture negative.   Pt reports continued L knee pain, bilateral peripheral edema. Denies acute sob, chest pain Medications: Outpatient Medications Prior to Visit  Medication Sig   acetaminophen (TYLENOL) 500 MG tablet Take 500 mg by mouth daily as needed (pain).   albuterol (PROVENTIL) (2.5 MG/3ML) 0.083% nebulizer solution Take 3 mLs (2.5 mg total) by nebulization every 6 (six) hours as needed for wheezing or shortness of breath. (Patient taking differently: Take 2.5 mg by nebulization as needed for wheezing or shortness of breath.)   albuterol (VENTOLIN HFA) 108 (90 Base) MCG/ACT inhaler Inhale into the lungs.   amitriptyline (ELAVIL) 10 MG tablet TAKE 1 TABLET BY MOUTH EVERYDAY AT BEDTIME   Ascorbic Acid (VITAMIN C PO) Take by mouth. 1 tablet daily   Aspirin-Acetaminophen-Caffeine (EXCEDRIN PO) Take 1 tablet by mouth as needed (headache).   B Complex Vitamins (VITAMIN B COMPLEX PO) Take by mouth. 1 daily   baclofen (LIORESAL) 10 MG tablet Take 1 tablet (10 mg total) by mouth 3 (three) times daily as needed for muscle spasms.   bismuth subsalicylate (PEPTO BISMOL) 262 MG/15ML suspension Take 30 mLs by mouth daily as needed (acid reflux).   budesonide-formoterol (SYMBICORT) 80-4.5 MCG/ACT inhaler TAKE 2 PUFFS BY MOUTH TWICE A DAY   Calcium Carbonate Antacid (TUMS PO) Take 2 tablets by mouth 2 (two) times daily as needed (acid reflux).   Cholecalciferol (D2000 ULTRA STRENGTH) 50 MCG (2000 UT) CAPS Take 2,000 Units by mouth daily.   Cyanocobalamin 1000 MCG TBCR Take by mouth.   diclofenac Sodium  (VOLTAREN) 1 % GEL Apply 4 g topically 4 (four) times daily.   fluticasone (FLONASE) 50 MCG/ACT nasal spray SPRAY 2 SPRAYS INTO EACH NOSTRIL EVERY DAY   gabapentin (NEURONTIN) 100 MG capsule TAKE 1 CAPSULE BY MOUTH THREE TIMES A DAY   IRON CR PO Take by mouth. 1 tablet daily   Iron, Ferrous Sulfate, 325 (65 Fe) MG TABS 1 tab po bid   meloxicam (MOBIC) 7.5 MG tablet Take 1 tablet (7.5 mg total) by mouth daily.   modafinil (PROVIGIL) 200 MG tablet Take one po qAM and 1/2 po qPM   Multiple Vitamin (MULTIVITAMIN WITH MINERALS) TABS tablet Take 1 tablet by mouth daily.   mupirocin ointment (BACTROBAN) 2 % Apply 1 Application topically 2 (two) times daily.   nystatin cream (MYCOSTATIN) Apply 1 Application topically 2 (two) times daily.   ocrelizumab (OCREVUS) 300 MG/10ML injection INFUSE 600MG  INTRAVENOUSLY AS DIRECTED EVERY 6 MONTHS.   olmesartan (BENICAR) 5 MG tablet 2 tab po q day   omeprazole (PRILOSEC) 20 MG capsule Take 40 mg by mouth 2 (two) times daily before a meal.   ondansetron (ZOFRAN) 4 MG tablet TAKE 1 TABLET BY MOUTH EVERY 8 HOURS AS NEEDED FOR NAUSEA AND VOMITING   oxybutynin (DITROPAN) 5 MG tablet Take 1 tablet (5 mg total) by mouth 2 (two) times daily.   Probiotic Product (PROBIOTIC DAILY PO) Take by mouth. 1 daily   Thiamine HCl (THIAMINE PO) Take by mouth.   [DISCONTINUED] ALOE  VERA JUICE PO Take by mouth. (Patient not taking: Reported on 05/15/2023)   [DISCONTINUED] chlorthalidone (HYGROTON) 25 MG tablet Take by mouth. (Patient not taking: Reported on 05/20/2023)   [DISCONTINUED] DULoxetine (CYMBALTA) 60 MG capsule Take by mouth. (Patient not taking: Reported on 05/20/2023)   [DISCONTINUED] ondansetron (ZOFRAN-ODT) 8 MG disintegrating tablet Take by mouth.   Facility-Administered Medications Prior to Visit  Medication Dose Route Frequency Provider   sodium chloride (PF) 0.9 % injection 2 mL  2 mL Intravenous PRN     Review of Systems  Constitutional:  Negative for fatigue and  fever.  Respiratory:  Negative for cough and shortness of breath.   Cardiovascular:  Positive for leg swelling. Negative for chest pain.  Gastrointestinal:  Negative for abdominal pain.  Musculoskeletal:  Positive for arthralgias and joint swelling.  Neurological:  Negative for dizziness and headaches.       Objective    BP 129/89   Pulse 81   Temp 98.2 F (36.8 C)   Ht 5\' 5"  (1.651 m)   Wt 170 lb 2 oz (77.2 kg)   LMP 08/13/2014 Comment: very few periods  SpO2 98%   BMI 28.31 kg/m    Physical Exam Constitutional:      General: She is awake.     Appearance: She is well-developed.  HENT:     Head: Normocephalic.  Eyes:     Conjunctiva/sclera: Conjunctivae normal.  Cardiovascular:     Rate and Rhythm: Normal rate and regular rhythm.     Heart sounds: Normal heart sounds.  Pulmonary:     Effort: Pulmonary effort is normal.     Breath sounds: Normal breath sounds.  Musculoskeletal:     Comments: 2+ pitting edema b/l   L knee tenderness, visible edema, minimal joint effusion. Bruising at site of arthrocentesis. No erythema, warmth  Skin:    General: Skin is warm.  Neurological:     Mental Status: She is alert and oriented to person, place, and time.  Psychiatric:        Attention and Perception: Attention normal.        Mood and Affect: Mood normal.        Speech: Speech normal.        Behavior: Behavior is cooperative.      No results found for any visits on 06/14/23.  Assessment & Plan    1. Chronic pain of left knee 2. Effusion of left knee Refer to ortho  Elevate, ice/heat. Cautioned w/ combination of nsaids at home - AMB referral to orthopedics  3. Localized edema BNP elevated in August. Repeat w/ cmp today Vitals stable, lungs cta Pt has f/u with cardiology as scheduled. Rx lasix 20 mg x 3 days.   - B Nat Peptide - Comp Met (CMET)   Return if symptoms worsen or fail to improve.       Alfredia Ferguson, PA-C  [facility] at  [facility] [place] [phone] (phone) [phone] (fax)  [facility]

## 2023-06-14 NOTE — Telephone Encounter (Signed)
Pt has appt with Rehab on 06/26/23 for knee pain

## 2023-06-14 NOTE — ED Triage Notes (Addendum)
Patient reports persistent left knee pain with swelling for several weeks , seen here yesterday for the same complaints x-ray done/needle aspiration by EDP. Ambulatory using her walker.

## 2023-06-14 NOTE — ED Provider Notes (Signed)
[place] EMERGENCY DEPARTMENT AT [facility] Provider Note   CSN: [id number] Arrival date & time: 06/14/23  0330     History  Chief Complaint  Patient presents with   Left Knee Pain/Swelling    Catherine Hubbard is a 62 y.o. female.  HPI Patient with extensive medical history presents with left knee pain.  This been an ongoing chronic issue.  No recent falls or trauma.  She has had increased swelling recently and required arthrocentesis. Patient reports yesterday during the day she had good pain control, but tonight it was worsening while trying to sleep and her husband brought her to the ER.  She has been ambulatory    Home Medications Prior to Admission medications   Medication Sig Start Date End Date Taking? Authorizing Provider  acetaminophen (TYLENOL) 500 MG tablet Take 500 mg by mouth daily as needed (pain).    [provider]  albuterol (PROVENTIL) (2.5 MG/3ML) 0.083% nebulizer solution Take 3 mLs (2.5 mg total) by nebulization every 6 (six) hours as needed for wheezing or shortness of breath. Patient taking differently: Take 2.5 mg by nebulization as needed for wheezing or shortness of breath. 11/26/19   Saguier, Ramon Dredge, PA-C  albuterol (VENTOLIN HFA) 108 (90 Base) MCG/ACT inhaler Inhale into the lungs. 06/29/16   [provider]  ALOE VERA JUICE PO Take by mouth. Patient not taking: Reported on 05/15/2023 01/13/14   [provider]  amitriptyline (ELAVIL) 10 MG tablet TAKE 1 TABLET BY MOUTH EVERYDAY AT BEDTIME 03/25/23   Raulkar, Drema Pry, MD  Ascorbic Acid (VITAMIN C PO) Take by mouth. 1 tablet daily    [provider]  Aspirin-Acetaminophen-Caffeine (EXCEDRIN PO) Take 1 tablet by mouth as needed (headache).    [provider]  B Complex Vitamins (VITAMIN B COMPLEX PO) Take by mouth. 1 daily    [provider]  baclofen (LIORESAL) 10 MG tablet Take 1 tablet (10 mg total) by mouth 3 (three) times daily as  needed for muscle spasms. 05/01/22   Sater, Pearletha Furl, MD  bismuth subsalicylate (PEPTO BISMOL) 262 MG/15ML suspension Take 30 mLs by mouth daily as needed (acid reflux).    [provider]  budesonide-formoterol (SYMBICORT) 80-4.5 MCG/ACT inhaler TAKE 2 PUFFS BY MOUTH TWICE A DAY 08/06/22   Saguier, Ramon Dredge, PA-C  Calcium Carbonate Antacid (TUMS PO) Take 2 tablets by mouth 2 (two) times daily as needed (acid reflux).    [provider]  chlorthalidone (HYGROTON) 25 MG tablet Take by mouth. Patient not taking: Reported on 05/20/2023 04/25/18   [provider]  Cholecalciferol (D2000 ULTRA STRENGTH) 50 MCG (2000 UT) CAPS Take 2,000 Units by mouth daily.    [provider]  Cyanocobalamin 1000 MCG TBCR Take by mouth. Patient not taking: Reported on 05/20/2023 01/13/14   [provider]  diclofenac Sodium (VOLTAREN) 1 % GEL Apply 4 g topically 4 (four) times daily. Patient not taking: Reported on 05/20/2023 06/21/22   Horton Chin, MD  DULoxetine (CYMBALTA) 60 MG capsule Take by mouth. Patient not taking: Reported on 05/20/2023 08/26/18   [provider]  fluticasone (FLONASE) 50 MCG/ACT nasal spray SPRAY 2 SPRAYS INTO EACH NOSTRIL EVERY DAY 10/23/22   Saguier, Ramon Dredge, PA-C  gabapentin (NEURONTIN) 100 MG capsule TAKE 1 CAPSULE BY MOUTH THREE TIMES A DAY 10/09/22   Saguier, Ramon Dredge, PA-C  IRON CR PO Take by mouth. 1 tablet daily    [provider]  Iron, Ferrous Sulfate, 325 (65 Fe)  MG TABS 1 tab po bid 11/09/22   Saguier, Ramon Dredge, PA-C  meloxicam (MOBIC) 7.5 MG tablet Take 1 tablet (7.5 mg total) by mouth daily. 12/17/22   Raulkar, Drema Pry, MD  modafinil (PROVIGIL) 200 MG tablet Take one po qAM and 1/2 po qPM 05/20/23   Sater, Pearletha Furl, MD  Multiple Vitamin (MULTIVITAMIN WITH MINERALS) TABS tablet Take 1 tablet by mouth daily.    [provider]  mupirocin ointment (BACTROBAN) 2 % Apply 1 Application topically 2 (two) times daily. Patient  not taking: Reported on 05/20/2023 04/06/22   Zola Button, Grayling Congress, DO  nystatin cream (MYCOSTATIN) Apply 1 Application topically 2 (two) times daily. 10/11/22   Saguier, Ramon Dredge, PA-C  ocrelizumab (OCREVUS) 300 MG/10ML injection INFUSE 600MG  INTRAVENOUSLY AS DIRECTED EVERY 6 MONTHS. 04/16/23   Sater, Pearletha Furl, MD  olmesartan (BENICAR) 5 MG tablet 2 tab po q day 11/05/22   Saguier, Ramon Dredge, PA-C  omeprazole (PRILOSEC) 20 MG capsule Take 40 mg by mouth 2 (two) times daily before a meal. 07/09/20   [provider]  ondansetron (ZOFRAN) 4 MG tablet TAKE 1 TABLET BY MOUTH EVERY 8 HOURS AS NEEDED FOR NAUSEA AND VOMITING 01/22/23   Saguier, Ramon Dredge, PA-C  ondansetron (ZOFRAN-ODT) 8 MG disintegrating tablet Take by mouth. 11/28/16   [provider]  oxybutynin (DITROPAN) 5 MG tablet Take 1 tablet (5 mg total) by mouth 2 (two) times daily. 05/01/22   Sater, Pearletha Furl, MD  Probiotic Product (PROBIOTIC DAILY PO) Take by mouth. 1 daily    [provider]  Thiamine HCl (THIAMINE PO) Take by mouth. 01/13/14   [provider]      Allergies    Ibuprofen and Nsaids    Review of Systems   Review of Systems  Physical Exam Updated Vital Signs BP (!) 149/82 (BP Location: Right Arm)   Pulse 73   Temp (!) 97.5 F (36.4 C) (Oral)   Resp 19   LMP 08/13/2014 Comment: very few periods  SpO2 95%  Physical Exam CONSTITUTIONAL: Elderly, disheveled HEAD: Normocephalic/atraumatic NEURO: Pt is awake/alert/appropriate, moves all extremitiesx4.  No facial droop.   EXTREMITIES: pulses normal/equal, full ROM Mild bruising noted in the left knee on the medial aspect at the site of the recent arthrocentesis.  There is no overlying erythema, no abscess or induration.  She has full range of motion of the left knee without difficulty.  There is no deformities or evidence of any recent trauma. Distal pulses are equal and intact.  There is no obvious calf tenderness SKIN: warm, color normal  ED  Results / Procedures / Treatments   Labs (all labs ordered are listed, but only abnormal results are displayed) Labs Reviewed - No data to display  EKG None  Radiology DG Knee Left Port  Result Date: 06/13/2023 CLINICAL DATA:  Ongoing left knee pain. EXAM: PORTABLE LEFT KNEE - 1-2 VIEW COMPARISON:  06/12/2023 FINDINGS: Diffuse bone demineralization. Mild degenerative changes with medial greater than lateral compartment narrowing and small osteophyte formation. Small left knee effusion. No evidence of acute fracture or dislocation. No focal bone lesion or bone destruction. Surgical clips in the soft tissues. IMPRESSION: Mild degenerative changes in the left knee. Small effusion. No acute displaced fractures. Electronically Signed   By: Burman Nieves M.D.   On: 06/13/2023 03:25    Procedures Procedures    Medications Ordered in ED Medications - No data to display  ED Course/ Medical Decision Making/ A&P  Medical Decision Making  This represents patient's fourth ER visit since October 20 for left knee pain.  This has been a chronic issue and has been seen previously by physical therapy.  I saw her last night for the same pain and performed a bedside arthrocentesis due to an effusion which she tolerated well.  Those results and did not reveal any evidence of gouty arthritis or infection.  I advised the patient and her husband via the phone that this is a chronic issue that should be managed as an outpatient and she was given referral to orthopedics        Final Clinical Impression(s) / ED Diagnoses Final diagnoses:  Chronic pain of left knee    Rx / DC Orders ED Discharge Orders     None         Zadie Rhine, MD 06/14/23 [phone]

## 2023-06-15 ENCOUNTER — Other Ambulatory Visit: Payer: Self-pay | Admitting: Medical

## 2023-06-16 LAB — BODY FLUID CULTURE W GRAM STAIN: Culture: NO GROWTH

## 2023-06-17 ENCOUNTER — Other Ambulatory Visit: Payer: Federal, State, Local not specified - PPO

## 2023-06-19 ENCOUNTER — Emergency Department (HOSPITAL_COMMUNITY): Payer: Federal, State, Local not specified - PPO

## 2023-06-19 ENCOUNTER — Observation Stay (HOSPITAL_COMMUNITY): Payer: Federal, State, Local not specified - PPO

## 2023-06-19 ENCOUNTER — Observation Stay (HOSPITAL_COMMUNITY)
Admission: EM | Admit: 2023-06-19 | Discharge: 2023-06-20 | Disposition: A | Discharge status: Home / Self Care | Payer: Federal, State, Local not specified - PPO | Attending: General Surgery | Admitting: General Surgery

## 2023-06-19 ENCOUNTER — Encounter (HOSPITAL_COMMUNITY): Payer: Self-pay

## 2023-06-19 ENCOUNTER — Other Ambulatory Visit: Payer: Self-pay

## 2023-06-19 DIAGNOSIS — R079 Chest pain, unspecified: Secondary | ICD-10-CM | POA: Diagnosis not present

## 2023-06-19 DIAGNOSIS — M19011 Primary osteoarthritis, right shoulder: Secondary | ICD-10-CM | POA: Diagnosis not present

## 2023-06-19 DIAGNOSIS — Z7982 Long term (current) use of aspirin: Secondary | ICD-10-CM | POA: Insufficient documentation

## 2023-06-19 DIAGNOSIS — J984 Other disorders of lung: Secondary | ICD-10-CM | POA: Diagnosis not present

## 2023-06-19 DIAGNOSIS — S20211A Contusion of right front wall of thorax, initial encounter: Principal | ICD-10-CM

## 2023-06-19 DIAGNOSIS — M778 Other enthesopathies, not elsewhere classified: Secondary | ICD-10-CM | POA: Diagnosis not present

## 2023-06-19 DIAGNOSIS — Z79899 Other long term (current) drug therapy: Secondary | ICD-10-CM | POA: Diagnosis not present

## 2023-06-19 DIAGNOSIS — G8911 Acute pain due to trauma: Secondary | ICD-10-CM | POA: Diagnosis not present

## 2023-06-19 DIAGNOSIS — W19XXXA Unspecified fall, initial encounter: Secondary | ICD-10-CM | POA: Diagnosis not present

## 2023-06-19 DIAGNOSIS — S299XXA Unspecified injury of thorax, initial encounter: Secondary | ICD-10-CM | POA: Diagnosis not present

## 2023-06-19 DIAGNOSIS — I10 Essential (primary) hypertension: Secondary | ICD-10-CM | POA: Diagnosis not present

## 2023-06-19 DIAGNOSIS — I700 Atherosclerosis of aorta: Secondary | ICD-10-CM | POA: Diagnosis not present

## 2023-06-19 DIAGNOSIS — M1712 Unilateral primary osteoarthritis, left knee: Secondary | ICD-10-CM | POA: Diagnosis not present

## 2023-06-19 DIAGNOSIS — K449 Diaphragmatic hernia without obstruction or gangrene: Secondary | ICD-10-CM | POA: Diagnosis not present

## 2023-06-19 DIAGNOSIS — S2001XA Contusion of right breast, initial encounter: Secondary | ICD-10-CM | POA: Diagnosis not present

## 2023-06-19 DIAGNOSIS — J45909 Unspecified asthma, uncomplicated: Secondary | ICD-10-CM | POA: Diagnosis not present

## 2023-06-19 DIAGNOSIS — R000 Tachycardia, unspecified: Secondary | ICD-10-CM | POA: Diagnosis not present

## 2023-06-19 DIAGNOSIS — S8002XA Contusion of left knee, initial encounter: Secondary | ICD-10-CM | POA: Diagnosis not present

## 2023-06-19 DIAGNOSIS — S0990XA Unspecified injury of head, initial encounter: Secondary | ICD-10-CM | POA: Diagnosis not present

## 2023-06-19 DIAGNOSIS — R918 Other nonspecific abnormal finding of lung field: Secondary | ICD-10-CM | POA: Diagnosis not present

## 2023-06-19 DIAGNOSIS — M25462 Effusion, left knee: Secondary | ICD-10-CM | POA: Diagnosis not present

## 2023-06-19 DIAGNOSIS — S8992XA Unspecified injury of left lower leg, initial encounter: Secondary | ICD-10-CM | POA: Diagnosis not present

## 2023-06-19 DIAGNOSIS — S2020XA Contusion of thorax, unspecified, initial encounter: Secondary | ICD-10-CM | POA: Diagnosis not present

## 2023-06-19 DIAGNOSIS — S42291A Other displaced fracture of upper end of right humerus, initial encounter for closed fracture: Secondary | ICD-10-CM | POA: Diagnosis not present

## 2023-06-19 DIAGNOSIS — S20219A Contusion of unspecified front wall of thorax, initial encounter: Secondary | ICD-10-CM | POA: Diagnosis present

## 2023-06-19 DIAGNOSIS — R0989 Other specified symptoms and signs involving the circulatory and respiratory systems: Secondary | ICD-10-CM | POA: Diagnosis not present

## 2023-06-19 LAB — TYPE AND SCREEN
ABO/RH(D): B POS
Antibody Screen: NEGATIVE

## 2023-06-19 LAB — CBC WITH DIFFERENTIAL/PLATELET
Abs Immature Granulocytes: 0.04 10*3/uL (ref 0.00–0.07)
Basophils Absolute: 0.1 10*3/uL (ref 0.0–0.1)
Basophils Relative: 1 %
Eosinophils Absolute: 0.2 10*3/uL (ref 0.0–0.5)
Eosinophils Relative: 2 %
HCT: 30.3 % — ABNORMAL LOW (ref 36.0–46.0)
Hemoglobin: 10.7 g/dL — ABNORMAL LOW (ref 12.0–15.0)
Immature Granulocytes: 0 %
Lymphocytes Relative: 15 %
Lymphs Abs: 1.6 10*3/uL (ref 0.7–4.0)
MCH: 33.8 pg (ref 26.0–34.0)
MCHC: 35.3 g/dL (ref 30.0–36.0)
MCV: 95.6 fL (ref 80.0–100.0)
Monocytes Absolute: 1.2 10*3/uL — ABNORMAL HIGH (ref 0.1–1.0)
Monocytes Relative: 11 %
Neutro Abs: 7.5 10*3/uL (ref 1.7–7.7)
Neutrophils Relative %: 71 %
Platelets: 268 10*3/uL (ref 150–400)
RBC: 3.17 MIL/uL — ABNORMAL LOW (ref 3.87–5.11)
RDW: 11.9 % (ref 11.5–15.5)
WBC: 10.6 10*3/uL — ABNORMAL HIGH (ref 4.0–10.5)
nRBC: 0 % (ref 0.0–0.2)

## 2023-06-19 LAB — CBC
HCT: 33.5 % — ABNORMAL LOW (ref 36.0–46.0)
Hemoglobin: 11.7 g/dL — ABNORMAL LOW (ref 12.0–15.0)
MCH: 33.4 pg (ref 26.0–34.0)
MCHC: 34.9 g/dL (ref 30.0–36.0)
MCV: 95.7 fL (ref 80.0–100.0)
Platelets: 297 10*3/uL (ref 150–400)
RBC: 3.5 MIL/uL — ABNORMAL LOW (ref 3.87–5.11)
RDW: 11.9 % (ref 11.5–15.5)
WBC: 10.1 10*3/uL (ref 4.0–10.5)
nRBC: 0 % (ref 0.0–0.2)

## 2023-06-19 LAB — BASIC METABOLIC PANEL
Anion gap: 16 — ABNORMAL HIGH (ref 5–15)
BUN: 13 mg/dL (ref 8–23)
CO2: 22 mmol/L (ref 22–32)
Calcium: 9.1 mg/dL (ref 8.9–10.3)
Chloride: 91 mmol/L — ABNORMAL LOW (ref 98–111)
Creatinine, Ser: 0.86 mg/dL (ref 0.44–1.00)
GFR, Estimated: >60 mL/min (ref >60)
Glucose, Bld: 102 mg/dL — ABNORMAL HIGH (ref 70–99)
Potassium: 2.8 mmol/L — ABNORMAL LOW (ref 3.5–5.1)
Sodium: 129 mmol/L — ABNORMAL LOW (ref 135–145)

## 2023-06-19 LAB — ABO/RH: ABO/RH(D): B POS

## 2023-06-19 LAB — TROPONIN I (HIGH SENSITIVITY)
Troponin I (High Sensitivity): 9 ng/L (ref <18)
Troponin I (High Sensitivity): 12 ng/L (ref <18)

## 2023-06-19 LAB — PROTIME-INR
INR: 1.1 (ref 0.8–1.2)
Prothrombin Time: 14 s (ref 11.4–15.2)

## 2023-06-19 LAB — HIV ANTIBODY (ROUTINE TESTING W REFLEX): HIV Screen 4th Generation wRfx: NONREACTIVE

## 2023-06-19 MED ORDER — GABAPENTIN 300 MG PO CAPS
300.0000 mg | ORAL_CAPSULE | Freq: Three times a day (TID) | ORAL | Status: DC
Start: 2023-06-19 — End: 2023-06-20
  Administered 2023-06-19 – 2023-06-20 (×4): 300 mg via ORAL
  Filled 2023-06-19 (×4): qty 1

## 2023-06-19 MED ORDER — HYDRALAZINE HCL 20 MG/ML IJ SOLN: 10.0000 mg | INTRAMUSCULAR | Status: DC | PRN

## 2023-06-19 MED ORDER — METHOCARBAMOL 1000 MG/10ML IJ SOLN
500.0000 mg | Freq: Three times a day (TID) | INTRAMUSCULAR | Status: DC
Start: 2023-06-19 — End: 2023-06-22

## 2023-06-19 MED ORDER — IOHEXOL 350 MG/ML SOLN
50.0000 mL | Freq: Once | INTRAVENOUS | Status: AC | PRN
  Administered 2023-06-19: 50 mL via INTRAVENOUS

## 2023-06-19 MED ORDER — ONDANSETRON HCL 4 MG/2ML IJ SOLN: 4.0000 mg | Freq: Four times a day (QID) | INTRAMUSCULAR | Status: DC | PRN

## 2023-06-19 MED ORDER — HYDROMORPHONE HCL 1 MG/ML IJ SOLN: 0.5000 mg | INTRAMUSCULAR | Status: DC | PRN

## 2023-06-19 MED ORDER — ONDANSETRON 4 MG PO TBDP
4.0000 mg | ORAL_TABLET | Freq: Four times a day (QID) | ORAL | Status: DC | PRN
  Administered 2023-06-19 – 2023-06-20 (×2): 4 mg via ORAL
  Filled 2023-06-19 (×2): qty 1

## 2023-06-19 MED ORDER — ACETAMINOPHEN 500 MG PO TABS
1000.0000 mg | ORAL_TABLET | Freq: Four times a day (QID) | ORAL | Status: DC
  Administered 2023-06-19 – 2023-06-20 (×5): 1000 mg via ORAL
  Filled 2023-06-19 (×5): qty 2

## 2023-06-19 MED ORDER — DOCUSATE SODIUM 100 MG PO CAPS
100.0000 mg | ORAL_CAPSULE | Freq: Two times a day (BID) | ORAL | Status: DC
Start: 2023-06-19 — End: 2023-06-20
  Administered 2023-06-19 – 2023-06-20 (×2): 100 mg via ORAL
  Filled 2023-06-19 (×2): qty 1

## 2023-06-19 MED ORDER — FENTANYL CITRATE PF 50 MCG/ML IJ SOSY
50.0000 ug | PREFILLED_SYRINGE | Freq: Once | INTRAMUSCULAR | Status: AC
  Administered 2023-06-19: 50 ug via INTRAVENOUS
  Filled 2023-06-19: qty 1

## 2023-06-19 MED ORDER — METHOCARBAMOL 500 MG PO TABS
500.0000 mg | ORAL_TABLET | Freq: Three times a day (TID) | ORAL | Status: DC
  Administered 2023-06-19 – 2023-06-20 (×3): 500 mg via ORAL
  Filled 2023-06-19 (×4): qty 1

## 2023-06-19 MED ORDER — POTASSIUM CHLORIDE 10 MEQ/100ML IV SOLN
10.0000 meq | INTRAVENOUS | Status: AC
  Administered 2023-06-19 (×2): 10 meq via INTRAVENOUS
  Filled 2023-06-19 (×2): qty 100

## 2023-06-19 MED ORDER — METOPROLOL TARTRATE 5 MG/5ML IV SOLN: 5.0000 mg | Freq: Four times a day (QID) | INTRAVENOUS | Status: DC | PRN

## 2023-06-19 MED ORDER — POLYETHYLENE GLYCOL 3350 17 G PO PACK
17.0000 g | PACK | Freq: Every day | ORAL | Status: DC | PRN
Start: 2023-06-19 — End: 2023-06-20

## 2023-06-19 MED ORDER — OXYCODONE HCL 5 MG PO TABS: 5.0000 mg | ORAL_TABLET | ORAL | Status: DC | PRN

## 2023-06-19 MED ORDER — OXYCODONE HCL 5 MG PO TABS
10.0000 mg | ORAL_TABLET | ORAL | Status: DC | PRN
  Administered 2023-06-19 – 2023-06-20 (×2): 10 mg via ORAL
  Filled 2023-06-19 (×2): qty 2

## 2023-06-19 NOTE — ED Notes (Signed)
Dr. Manus Gunningancour at bedside to assess pt.

## 2023-06-19 NOTE — Progress Notes (Signed)
OT Cancellation Note  Patient Details Name: Catherine Hubbard MRN: [id number] DOB: 02/18/1961   Cancelled Treatment:    Reason Eval/Treat Not Completed: Medical issues which prohibited therapy (Awaiting ortho orders/consult to determine pt WB status and need for further intervention. OT will follow-up with pt as able.)  06/19/2023  AB, OTR/L  Acute Rehabilitation Services  Office: [phone]   Tristan Schroeder 06/19/2023, 3:43 PM

## 2023-06-19 NOTE — ED Notes (Signed)
ED TO INPATIENT HANDOFF REPORT  ED Nurse Name and Phone #: Aggie Cosier [phone]  S Name/Age/Gender Catherine Hubbard 62 y.o. female Room/Bed: 033C/033C  Code Status   Code Status: Full Code  Home/SNF/Other Home Patient oriented to: self, place, time, and situation Is this baseline? Yes   Triage Complete: Triage complete  Chief Complaint Chest wall hematoma [S20.219A]  Triage Note Patient BIB [facility] with complaint of GLF, reports was not using walker at time of fall. EMS called for lift assist.   Patient has large bruising under right arm, per patient bruising not associated with this fall, reports unknown origin.   Denies Blood thinners  Denies head injury, denies LOC.    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 or [facility] if equivalent level of care is available:: No  Covid Evaluation: Asymptomatic - no recent exposure (last 10 days) testing not required  Diagnosis: Chest wall hematoma [884166]  Admitting Physician: TRAUMA MD [[id number]]  Attending Physician: TRAUMA MD [[id number]]  For patients discharging to extended facilities (i.e. SNF, AL, group homes or LTAC) initiate:: Discharge to SNF/Facility Placement COVID-19 Lab Testing Protocol          B Medical/Surgery History Past Medical History:  Diagnosis Date   Alcohol abuse    Asthma    Esophagitis    GERD (gastroesophageal reflux disease)    Headache    Hearing loss    Hypertension    Multiple sclerosis (HCC)    Pancreatitis    Vision abnormalities    Past Surgical History:  Procedure Laterality Date   ANKLE FRACTURE  SURGERY Bilateral    CARPAL TUNNEL RELEASE Bilateral    CHOLECYSTECTOMY     COLONOSCOPY  11/2016   multiple   ESOPHAGOGASTRODUODENOSCOPY  11/2016   ESOPHAGOGASTRODUODENOSCOPY N/A 02/01/2017   Procedure: ESOPHAGOGASTRODUODENOSCOPY (EGD);  Surgeon: Iva Boop, MD;  Location: [facility] ENDOSCOPY;  Service: Endoscopy;  Laterality: N/A;   ESOPHAGOGASTRODUODENOSCOPY (EGD) WITH PROPOFOL N/A 02/04/2017   Procedure: ESOPHAGOGASTRODUODENOSCOPY (EGD) WITH PROPOFOL;  Surgeon: Meryl Dare, MD;  Location: [facility] ENDOSCOPY;  Service: Endoscopy;  Laterality: N/A;   LAPAROSCOPIC GASTRIC SLEEVE RESECTION  2011   ULNAR NERVE REPAIR Bilateral      A IV Location/Drains/Wounds Patient Lines/Drains/Airways Status     Active Line/Drains/Airways     Name Placement date Placement time Site Days   Peripheral IV 06/19/23 20 G Anterior;Proximal;Right Forearm 06/19/23  0405  Forearm  less than 1            Intake/Output Last 24 hours No intake or output data in the 24 hours ending 06/19/23 0712  Labs/Imaging Results for orders placed or performed during the hospital encounter of 06/19/23 (from the past 48 hour(s))  Basic metabolic panel     Status: Abnormal   Collection Time: 06/19/23  2:05 AM  Result Value Ref Range   Sodium 129 (L) 135 - 145 mmol/L   Potassium 2.8 (L) 3.5 - 5.1 mmol/L   Chloride 91 (L) 98 - 111 mmol/L   CO2 22 22 - 32 mmol/L  Glucose, Bld 102 (H) 70 - 99 mg/dL    Comment: Glucose reference range applies only to samples taken after fasting for at least 8 hours.   BUN 13 8 - 23 mg/dL   Creatinine, Ser 1.61 0.44 - 1.00 mg/dL   Calcium 9.1 8.9 - 09.6 mg/dL   GFR, Estimated >04 >54 mL/min    Comment: (NOTE) Calculated using the CKD-EPI Creatinine Equation (2021)    Anion gap 16 (H) 5 - 15    Comment: Performed at [facility], [address], [place], [place] [address]  CBC     Status: Abnormal   Collection Time: 06/19/23  2:05 AM  Result Value Ref Range   WBC 10.1 4.0 -  10.5 K/uL   RBC 3.50 (L) 3.87 - 5.11 MIL/uL   Hemoglobin 11.7 (L) 12.0 - 15.0 g/dL   HCT 91.4 (L) 78.2 - 95.6 %   MCV 95.7 80.0 - 100.0 fL   MCH 33.4 26.0 - 34.0 pg   MCHC 34.9 30.0 - 36.0 g/dL   RDW 21.3 08.6 - 57.8 %   Platelets 297 150 - 400 K/uL   nRBC 0.0 0.0 - 0.2 %    Comment: Performed at [facility], [address] [place], [place] [address]  Troponin I (High Sensitivity)     Status: None   Collection Time: 06/19/23  2:05 AM  Result Value Ref Range   Troponin I (High Sensitivity) 9 <18 ng/L    Comment: (NOTE) Elevated high sensitivity troponin I (hsTnI) values and significant  changes across serial measurements may suggest ACS but many other  chronic and acute conditions are known to elevate hsTnI results.  Refer to the "Links" section for chest pain algorithms and additional  guidance. Performed at [facility], [address] [place], [place] [address]   ABO/Rh     Status: None (Preliminary result)   Collection Time: 06/19/23  2:05 AM  Result Value Ref Range   ABO/RH(D) PENDING   Troponin I (High Sensitivity)     Status: None   Collection Time: 06/19/23  4:12 AM  Result Value Ref Range   Troponin I (High Sensitivity) 12 <18 ng/L    Comment: (NOTE) Elevated high sensitivity troponin I (hsTnI) values and significant  changes across serial measurements may suggest ACS but many other  chronic and acute conditions are known to elevate hsTnI results.  Refer to the "Links" section for chest pain algorithms and additional  guidance. Performed at [facility], [address], [place], [place] [address]    DG Knee Complete 4 Views Left  Result Date: 06/19/2023 CLINICAL DATA:  Fall with bruising to left knee EXAM: LEFT KNEE - COMPLETE 4 VIEW COMPARISON:  Six days prior FINDINGS: Hyperextended appearance of the knee. There is a subtle lucency through the medial supracondylar femur seen on at least two views. Subjective osteopenia. Large knee joint  effusion. IMPRESSION: 1. Possible supracondylar femur fracture, recommend CT without contrast. 2. Knee joint effusion. Electronically Signed   By: Tiburcio Pea M.D.   On: 06/19/2023 06:40   DG Shoulder Right  Result Date: 06/19/2023 CLINICAL DATA:  Fall with right axillary bruising. EXAM: RIGHT SHOULDER - 3 VIEW COMPARISON:  Chest CT performed at the same time. FINDINGS: Remote right femoral neck fracture with healed posterior impaction. No indication of acute fracture or dislocation. Degenerative spurring at the glenohumeral and acromioclavicular joints. IMPRESSION: 1. No acute finding. 2. Remote fracture of the right  humeral neck. Electronically Signed   By: Tiburcio Pea M.D.   On: 06/19/2023 06:37   CT Chest W Contrast  Result Date: 06/19/2023 CLINICAL DATA:  62 year old female with history of blunt trauma to the right chest wall from a fall. Hematoma. EXAM: CT CHEST WITH CONTRAST TECHNIQUE: Multidetector CT imaging of the chest was performed during intravenous contrast administration. RADIATION DOSE REDUCTION: This exam was performed according to the departmental dose-optimization program which includes automated exposure control, adjustment of the mA and/or kV according to patient size and/or use of iterative reconstruction technique. CONTRAST:  50mL OMNIPAQUE IOHEXOL 350 MG/ML SOLN COMPARISON:  Chest CT 04/14/2019. FINDINGS: Cardiovascular: No abnormal high attenuation fluid within the mediastinum to suggest posttraumatic mediastinal hematoma. No evidence of posttraumatic aortic dissection/transection. Heart size is normal. There is no significant pericardial fluid, thickening or pericardial calcification. There is aortic atherosclerosis, as well as atherosclerosis of the great vessels of the mediastinum and the coronary arteries, including calcified atherosclerotic plaque in the left anterior descending coronary artery. Mediastinum/Nodes: No pathologically enlarged mediastinal or hilar lymph  nodes. Moderate-sized hiatal hernia. No axillary lymphadenopathy. Lungs/Pleura: No pneumothorax. Some patchy ground-glass attenuation is noted throughout the right lung, potentially areas of alveolar hemorrhage from mild pulmonary contusion or inflammation as sequela of aspiration. No confluent consolidative airspace disease. No definite suspicious appearing pulmonary nodules or masses are noted. No pleural effusions. Upper Abdomen: Postoperative changes of sleeve gastrectomy. Status post cholecystectomy. Musculoskeletal: Extensive soft tissue stranding throughout the right breast. In the lateral aspect of the right breast there is a large collection of material which is heterogeneous in attenuation but generally intermediate to high attenuation, likely a hematoma estimated to measure approximately 6.5 x 7.8 x 4.8 cm (estimated volume of 122 mL). Within this collection there is a serpiginous focus of high attenuation within ill-defined inferior margin, likely to reflect active extravasation. Chronic appearing compression fracture of T8 with proximally 70% loss of anterior vertebral body height. Old healed fracture of the inferior aspect of the manubrium with mild posttraumatic deformity. Multiple old healed bilateral rib fractures. No definite acute displaced fractures or aggressive appearing lytic or blastic lesions are noted in the visualized portions of the skeleton. IMPRESSION: 1. Large predominantly intermediate to high attenuation collection in the right breast likely to represent a large hematoma, with evidence of active extravasation within the collection, as detailed above. The possibility of active bleeding into a pre-existing breast mass is not excluded, but not strongly favored given the patient's history. 2. Patchy areas of ground-glass attenuation in the right lung, which may reflect mild alveolar hemorrhage from pulmonary contusion or acute inflammation in the setting of mild aspiration. No other  signs of significant acute intrathoracic trauma. 3. Moderate-sized hiatal hernia. 4. Aortic atherosclerosis, in addition to left anterior descending coronary artery disease. Please note that although the presence of coronary artery calcium documents the presence of coronary artery disease, the severity of this disease and any potential stenosis cannot be assessed on this non-gated CT examination. Assessment for potential risk factor modification, dietary therapy or pharmacologic therapy may be warranted, if clinically indicated. 5. Additional incidental findings, as above. Critical Value/emergent results were called by telephone at the time of interpretation on 06/19/2023 at 6:15 am to provider [facility], who verbally acknowledged these results. Aortic Atherosclerosis (ICD10-I70.0). Electronically Signed   By: Trudie Reed M.D.   On: 06/19/2023 06:15   DG Chest 2 View  Result Date: 06/19/2023 CLINICAL DATA:  Fall pain under the right breast EXAM:  CHEST - 2 VIEW COMPARISON:  04/15/2023 FINDINGS: Low lung volumes. Mild heterogeneous airspace disease at the bases. Normal cardiac size. No pneumothorax. Lucency beneath the right diaphragm, probable high-riding bowel. Moderate severe compression deformity of mid to lower thoracic vertebra. This is unchanged. IMPRESSION: 1. Low lung volumes with mild heterogeneous airspace disease at the bases, atelectasis versus pneumonia. 2. Lucency beneath the right diaphragm, favored to represent air-filled high-riding bowel. Electronically Signed   By: Jasmine Pang M.D.   On: 06/19/2023 03:36    Pending Labs Unresulted Labs (From admission, onward)     Start     Ordered   06/20/23 0500  CBC  Tomorrow morning,   R        06/19/23 0703   06/20/23 0500  Basic metabolic panel  Tomorrow morning,   R        06/19/23 0703   06/19/23 0702  HIV Antibody (routine testing w rflx)  (HIV Antibody (Routine testing w reflex) panel)  Once,   R        06/19/23 0703    06/19/23 0628  Type and screen [facility]  Once,   STAT       Comments: [facility]    06/19/23 0627   06/19/23 0628  CBC with Differential  Once,   STAT        06/19/23 0627   06/19/23 0628  Protime-INR  Once,   STAT        06/19/23 0627            Vitals/Pain Today's Vitals   06/19/23 0411 06/19/23 0500 06/19/23 0531 06/19/23 0650  BP:  93/71  (!) 134/107  Pulse:  82  92  Resp:  20  18  Temp:      TempSrc:      SpO2:  97%  96%  Weight: 77.2 kg     Height: 5\' 5"  (1.651 m)     PainSc:   6      Isolation Precautions No active isolations  Medications Medications  potassium chloride 10 mEq in 100 mL IVPB (10 mEq Intravenous New Bag/Given 06/19/23 0705)  acetaminophen (TYLENOL) tablet 1,000 mg (has no administration in time range)  methocarbamol (ROBAXIN) tablet 500 mg (has no administration in time range)    Or  methocarbamol (ROBAXIN) injection 500 mg (has no administration in time range)  docusate sodium (COLACE) capsule 100 mg (has no administration in time range)  polyethylene glycol (MIRALAX / GLYCOLAX) packet 17 g (has no administration in time range)  ondansetron (ZOFRAN-ODT) disintegrating tablet 4 mg (has no administration in time range)    Or  ondansetron (ZOFRAN) injection 4 mg (has no administration in time range)  metoprolol tartrate (LOPRESSOR) injection 5 mg (has no administration in time range)  hydrALAZINE (APRESOLINE) injection 10 mg (has no administration in time range)  oxyCODONE (Oxy IR/ROXICODONE) immediate release tablet 5 mg (has no administration in time range)  oxyCODONE (Oxy IR/ROXICODONE) immediate release tablet 10 mg (has no administration in time range)  HYDROmorphone (DILAUDID) injection 0.5 mg (has no administration in time range)  gabapentin (NEURONTIN) capsule 300 mg (has no administration in time range)  fentaNYL (SUBLIMAZE) injection 50 mcg (50 mcg Intravenous Given 06/19/23 0530)  iohexol  (OMNIPAQUE) 350 MG/ML injection 50 mL (50 mLs Intravenous Contrast Given 06/19/23 0541)    Mobility walks with device     R Recommendations: See Admitting Provider Note  Report given to:   Additional Notes: A&O  x4, ambulatory with devices, pt has a huge bruise to right lateral rib cage under axilla. Slight discomfort with movement to right arm. K+ 2.8, first run of potassium infusing at this time. PIV Right AC, 20 g. No family at bedside. Pt is sweet. BP has been on the softer side SBP 90s.

## 2023-06-19 NOTE — Progress Notes (Signed)
PT Cancellation Note  Patient Details Name: Catherine Hubbard MRN: [id number] DOB: 1961-07-21   Cancelled Treatment:    Reason Eval/Treat Not Completed: Other (comment) Per imaging, appears to be some concern over supracondylar femur fracture, reached out to RN to clarify if ortho will be doing a consult. Will need clarification regarding WB precautions for mobility prior to PT eval.   Nedra Hai, PT, DPT 06/19/23 8:21 AM

## 2023-06-19 NOTE — ED Notes (Signed)
Pt transported to CT ?

## 2023-06-19 NOTE — H&P (Signed)
Surgical Evaluation  Chief Complaint: Axillary hematoma  HPI: 62 year old woman with history of alcohol abuse, MS, pancreatitis, osteoarthritis, chronic knee pain, esophagitis, depression and anxiety who presented to the emergency department after a ground-level fall.  She was walking to the bathroom without her walker which she is supposed to use, and was using furniture along the wall to brace herself.  Her husband was walking behind her and caught her as she was falling and was able to lower her to the ground without hitting anything, but she did sustain a large bruise and hematoma to the right axilla where her husband had caught her.  She reports isolated pain in this area.  Denies loss of consciousness, no chest pain or shortness of breath, no abdominal pain or nausea.  Underwent a CT scan that demonstrates a large right axillary/breast hematoma with extravasation and trauma is consulted for further care.  Allergies  Allergen Reactions   Ibuprofen Other (See Comments)    Cannot take due to gastric bypass   Nsaids Other (See Comments)    Can not take due to Gastric bypass    Past Medical History:  Diagnosis Date   Alcohol abuse    Asthma    Esophagitis    GERD (gastroesophageal reflux disease)    Headache    Hearing loss    Hypertension    Multiple sclerosis (HCC)    Pancreatitis    Vision abnormalities     Past Surgical History:  Procedure Laterality Date   ANKLE FRACTURE SURGERY Bilateral    CARPAL TUNNEL RELEASE Bilateral    CHOLECYSTECTOMY     COLONOSCOPY  11/2016   multiple   ESOPHAGOGASTRODUODENOSCOPY  11/2016   ESOPHAGOGASTRODUODENOSCOPY N/A 02/01/2017   Procedure: ESOPHAGOGASTRODUODENOSCOPY (EGD);  Surgeon: Iva Boop, MD;  Location: [facility] ENDOSCOPY;  Service: Endoscopy;  Laterality: N/A;   ESOPHAGOGASTRODUODENOSCOPY (EGD) WITH PROPOFOL N/A 02/04/2017   Procedure: ESOPHAGOGASTRODUODENOSCOPY (EGD) WITH PROPOFOL;  Surgeon: Meryl Dare, MD;  Location: [facility]  ENDOSCOPY;  Service: Endoscopy;  Laterality: N/A;   LAPAROSCOPIC GASTRIC SLEEVE RESECTION  2011   ULNAR NERVE REPAIR Bilateral     Family History  Problem Relation Age of Onset   Cancer Mother    Stroke Mother    Cancer Father     Social History   Socioeconomic History   Marital status: Married    Spouse name: Not on file   Number of children: Not on file   Years of education: Not on file   Highest education level: Not on file  Occupational History   Not on file  Tobacco Use   Smoking status: Never   Smokeless tobacco: Never   Tobacco comments:    second hand smoker when young. Heavy exposure.  Vaping Use   Vaping status: Never Used  Substance and Sexual Activity   Alcohol use: Yes    Alcohol/week: 6.0 standard drinks of alcohol    Types: 6 Cans of beer per week    Comment: daily, couple of shots ( weekly depending on day)   Drug use: No   Sexual activity: Not Currently  Other Topics Concern   Not on file  Social History Narrative   Not on file   Social Determinants of Health   Financial Resource Strain: Not on file  Food Insecurity: Not on file  Transportation Needs: Not on file  Physical Activity: Not on file  Stress: Not on file  Social Connections: Not on file    Current Facility-Administered Medications on File Prior  to Encounter  Medication Dose Route Frequency Provider Last Rate Last Admin   sodium chloride (PF) 0.9 % injection 2 mL  2 mL Intravenous PRN        Current Outpatient Medications on File Prior to Encounter  Medication Sig Dispense Refill   acetaminophen (TYLENOL) 500 MG tablet Take 500 mg by mouth daily as needed (pain).     albuterol (PROVENTIL) (2.5 MG/3ML) 0.083% nebulizer solution Take 3 mLs (2.5 mg total) by nebulization every 6 (six) hours as needed for wheezing or shortness of breath. (Patient taking differently: Take 2.5 mg by nebulization as needed for wheezing or shortness of breath.) 150 mL 1   albuterol (VENTOLIN HFA) 108 (90  Base) MCG/ACT inhaler Inhale into the lungs.     amitriptyline (ELAVIL) 10 MG tablet TAKE 1 TABLET BY MOUTH EVERYDAY AT BEDTIME 90 tablet 1   Ascorbic Acid (VITAMIN C PO) Take by mouth. 1 tablet daily     Aspirin-Acetaminophen-Caffeine (EXCEDRIN PO) Take 1 tablet by mouth as needed (headache).     B Complex Vitamins (VITAMIN B COMPLEX PO) Take by mouth. 1 daily     baclofen (LIORESAL) 10 MG tablet Take 1 tablet (10 mg total) by mouth 3 (three) times daily as needed for muscle spasms. 90 tablet 11   bismuth subsalicylate (PEPTO BISMOL) 262 MG/15ML suspension Take 30 mLs by mouth daily as needed (acid reflux).     budesonide-formoterol (SYMBICORT) 80-4.5 MCG/ACT inhaler TAKE 2 PUFFS BY MOUTH TWICE A DAY 10.2 each 3   Calcium Carbonate Antacid (TUMS PO) Take 2 tablets by mouth 2 (two) times daily as needed (acid reflux).     Cholecalciferol (D2000 ULTRA STRENGTH) 50 MCG (2000 UT) CAPS Take 2,000 Units by mouth daily.     Cyanocobalamin 1000 MCG TBCR Take by mouth.     diclofenac Sodium (VOLTAREN) 1 % GEL Apply 4 g topically 4 (four) times daily. 400 g 1   fluticasone (FLONASE) 50 MCG/ACT nasal spray SPRAY 2 SPRAYS INTO EACH NOSTRIL EVERY DAY 48 mL 3   furosemide (LASIX) 20 MG tablet Take 1 tablet (20 mg total) by mouth daily. For the next 3 days 3 tablet 0   gabapentin (NEURONTIN) 100 MG capsule TAKE 1 CAPSULE BY MOUTH THREE TIMES A DAY 90 capsule 1   IRON CR PO Take by mouth. 1 tablet daily     Iron, Ferrous Sulfate, 325 (65 Fe) MG TABS 1 tab po bid 60 tablet 3   meloxicam (MOBIC) 7.5 MG tablet Take 1 tablet (7.5 mg total) by mouth daily. 30 tablet 2   modafinil (PROVIGIL) 200 MG tablet Take one po qAM and 1/2 po qPM 45 tablet 5   Multiple Vitamin (MULTIVITAMIN WITH MINERALS) TABS tablet Take 1 tablet by mouth daily.     mupirocin ointment (BACTROBAN) 2 % Apply 1 Application topically 2 (two) times daily. 22 g 2   nystatin cream (MYCOSTATIN) Apply 1 Application topically 2 (two) times daily. 30 g  1   ocrelizumab (OCREVUS) 300 MG/10ML injection INFUSE 600MG  INTRAVENOUSLY AS DIRECTED EVERY 6 MONTHS. 20 mL 1   olmesartan (BENICAR) 5 MG tablet 2 tab po q day 180 tablet 3   omeprazole (PRILOSEC) 20 MG capsule Take 40 mg by mouth 2 (two) times daily before a meal.     ondansetron (ZOFRAN) 4 MG tablet TAKE 1 TABLET BY MOUTH EVERY 8 HOURS AS NEEDED FOR NAUSEA AND VOMITING 16 tablet 1   oxybutynin (DITROPAN) 5 MG tablet Take 1 tablet (  5 mg total) by mouth 2 (two) times daily. 60 tablet 11   Probiotic Product (PROBIOTIC DAILY PO) Take by mouth. 1 daily     Thiamine HCl (THIAMINE PO) Take by mouth.      Review of Systems: a complete, 10pt review of systems was completed with pertinent positives and negatives as documented in the HPI  Physical Exam: Vitals:   06/19/23 0500 06/19/23 0650  BP: 93/71 (!) 134/107  Pulse: 82 92  Resp: 20 18  Temp:    SpO2: 97% 96%   Gen: A&Ox3, no distress  Eyes: lids and conjunctivae normal, no icterus. Pupils equally round and reactive to light.  Neck: Trachea midline, no crepitus or hematoma.  No C-spine tenderness. Chest: respiratory effort is normal.  She has an approximately 10 x 15 cm hematoma with extensive surrounding ecchymosis in the right lateral pectoral region between the axilla and the right breast which is expectedly tender Cardiovascular: RRR with palpable distal pulses, no pedal edema Gastrointestinal: soft, nondistended, nontender. Muscoloskeletal: no clubbing or cyanosis of the fingers.  Strength is symmetrical throughout.  Range of motion of bilateral upper and lower extremities normal without pain, crepitation or contracture.  Bilateral nonpitting lower extremity edema Neuro: cranial nerves grossly intact.  Sensation intact to light touch diffusely. Psych: appropriate mood and affect, normal insight/judgment intact  Skin: warm and dry      Latest Ref Rng & Units 06/19/2023    2:05 AM 04/26/2023    2:01 PM 01/18/2023   12:38 PM  CBC   WBC 4.0 - 10.5 K/uL 10.1  13.6  8.1   Hemoglobin 12.0 - 15.0 g/dL 16.1  09.6  04.5   Hematocrit 36.0 - 46.0 % 33.5  38.3  37.7   Platelets 150 - 400 K/uL 297  285  269        Latest Ref Rng & Units 06/19/2023    2:05 AM 04/15/2023    4:49 PM 12/07/2022    2:57 PM  CMP  Glucose 70 - 99 mg/dL 409  95  91   BUN 8 - 23 mg/dL 13  8  14    Creatinine 0.44 - 1.00 mg/dL 8.11  9.14  7.82   Sodium 135 - 145 mmol/L 129  139  141   Potassium 3.5 - 5.1 mmol/L 2.8  4.6  3.7   Chloride 98 - 111 mmol/L 91  97  97   CO2 22 - 32 mmol/L 22  33  35   Calcium 8.9 - 10.3 mg/dL 9.1  9.8  9.0   Total Protein 6.0 - 8.3 g/dL  6.6  6.5   Total Bilirubin 0.2 - 1.2 mg/dL  1.2  0.5   Alkaline Phos 39 - 117 U/L  76  108   AST 0 - 37 U/L  30  17   ALT 0 - 35 U/L  11  9     Lab Results  Component Value Date   INR 1.0 03/28/2022   INR 1.1 05/24/2021   INR 1.1 01/29/2019    Imaging: DG Knee Complete 4 Views Left  Result Date: 06/19/2023 CLINICAL DATA:  Fall with bruising to left knee EXAM: LEFT KNEE - COMPLETE 4 VIEW COMPARISON:  Six days prior FINDINGS: Hyperextended appearance of the knee. There is a subtle lucency through the medial supracondylar femur seen on at least two views. Subjective osteopenia. Large knee joint effusion. IMPRESSION: 1. Possible supracondylar femur fracture, recommend CT without contrast. 2. Knee joint effusion. Electronically Signed  By: Tiburcio Pea M.D.   On: 06/19/2023 06:40   DG Shoulder Right  Result Date: 06/19/2023 CLINICAL DATA:  Fall with right axillary bruising. EXAM: RIGHT SHOULDER - 3 VIEW COMPARISON:  Chest CT performed at the same time. FINDINGS: Remote right femoral neck fracture with healed posterior impaction. No indication of acute fracture or dislocation. Degenerative spurring at the glenohumeral and acromioclavicular joints. IMPRESSION: 1. No acute finding. 2. Remote fracture of the right humeral neck. Electronically Signed   By: Tiburcio Pea M.D.   On:  06/19/2023 06:37   CT Chest W Contrast  Result Date: 06/19/2023 CLINICAL DATA:  62 year old female with history of blunt trauma to the right chest wall from a fall. Hematoma. EXAM: CT CHEST WITH CONTRAST TECHNIQUE: Multidetector CT imaging of the chest was performed during intravenous contrast administration. RADIATION DOSE REDUCTION: This exam was performed according to the departmental dose-optimization program which includes automated exposure control, adjustment of the mA and/or kV according to patient size and/or use of iterative reconstruction technique. CONTRAST:  50mL OMNIPAQUE IOHEXOL 350 MG/ML SOLN COMPARISON:  Chest CT 04/14/2019. FINDINGS: Cardiovascular: No abnormal high attenuation fluid within the mediastinum to suggest posttraumatic mediastinal hematoma. No evidence of posttraumatic aortic dissection/transection. Heart size is normal. There is no significant pericardial fluid, thickening or pericardial calcification. There is aortic atherosclerosis, as well as atherosclerosis of the great vessels of the mediastinum and the coronary arteries, including calcified atherosclerotic plaque in the left anterior descending coronary artery. Mediastinum/Nodes: No pathologically enlarged mediastinal or hilar lymph nodes. Moderate-sized hiatal hernia. No axillary lymphadenopathy. Lungs/Pleura: No pneumothorax. Some patchy ground-glass attenuation is noted throughout the right lung, potentially areas of alveolar hemorrhage from mild pulmonary contusion or inflammation as sequela of aspiration. No confluent consolidative airspace disease. No definite suspicious appearing pulmonary nodules or masses are noted. No pleural effusions. Upper Abdomen: Postoperative changes of sleeve gastrectomy. Status post cholecystectomy. Musculoskeletal: Extensive soft tissue stranding throughout the right breast. In the lateral aspect of the right breast there is a large collection of material which is heterogeneous in  attenuation but generally intermediate to high attenuation, likely a hematoma estimated to measure approximately 6.5 x 7.8 x 4.8 cm (estimated volume of 122 mL). Within this collection there is a serpiginous focus of high attenuation within ill-defined inferior margin, likely to reflect active extravasation. Chronic appearing compression fracture of T8 with proximally 70% loss of anterior vertebral body height. Old healed fracture of the inferior aspect of the manubrium with mild posttraumatic deformity. Multiple old healed bilateral rib fractures. No definite acute displaced fractures or aggressive appearing lytic or blastic lesions are noted in the visualized portions of the skeleton. IMPRESSION: 1. Large predominantly intermediate to high attenuation collection in the right breast likely to represent a large hematoma, with evidence of active extravasation within the collection, as detailed above. The possibility of active bleeding into a pre-existing breast mass is not excluded, but not strongly favored given the patient's history. 2. Patchy areas of ground-glass attenuation in the right lung, which may reflect mild alveolar hemorrhage from pulmonary contusion or acute inflammation in the setting of mild aspiration. No other signs of significant acute intrathoracic trauma. 3. Moderate-sized hiatal hernia. 4. Aortic atherosclerosis, in addition to left anterior descending coronary artery disease. Please note that although the presence of coronary artery calcium documents the presence of coronary artery disease, the severity of this disease and any potential stenosis cannot be assessed on this non-gated CT examination. Assessment for potential risk factor modification, dietary therapy  or pharmacologic therapy may be warranted, if clinically indicated. 5. Additional incidental findings, as above. Critical Value/emergent results were called by telephone at the time of interpretation on 06/19/2023 at 6:15 am to  provider [facility], who verbally acknowledged these results. Aortic Atherosclerosis (ICD10-I70.0). Electronically Signed   By: Trudie Reed M.D.   On: 06/19/2023 06:15   DG Chest 2 View  Result Date: 06/19/2023 CLINICAL DATA:  Fall pain under the right breast EXAM: CHEST - 2 VIEW COMPARISON:  04/15/2023 FINDINGS: Low lung volumes. Mild heterogeneous airspace disease at the bases. Normal cardiac size. No pneumothorax. Lucency beneath the right diaphragm, probable high-riding bowel. Moderate severe compression deformity of mid to lower thoracic vertebra. This is unchanged. IMPRESSION: 1. Low lung volumes with mild heterogeneous airspace disease at the bases, atelectasis versus pneumonia. 2. Lucency beneath the right diaphragm, favored to represent air-filled high-riding bowel. Electronically Signed   By: Jasmine Pang M.D.   On: 06/19/2023 03:36     A/P: Right chest wall/axillary/breast hematoma -Trauma service for observation, repeat hemoglobin -Apply ice, pressure    Patient Active Problem List   Diagnosis Date Noted   Primary osteoarthritis of both knees 11/29/2022   Hematoma 11/29/2022   Post-traumatic osteoarthritis of right ankle 11/29/2022   Impingement syndrome of left ankle 11/29/2022   Iron deficiency anemia 11/29/2022   Compression fracture of L1 lumbar vertebra (HCC) 11/29/2022   Estrogen deficiency 11/29/2022   Skin tear of forearm without complication, subsequent encounter 04/06/2022   Hyponatremia 03/28/2022   Frequent falls 03/28/2022   Hypomagnesemia 03/28/2022   Alcohol abuse 03/28/2022   GERD without esophagitis 03/28/2022   History of alcohol abuse 12/02/2019   Multiple rib fractures 04/14/2019   Multiple fractures of ribs, bilateral, initial encounter for closed fracture 04/14/2019   Closed 3-part fracture of proximal humerus with routine healing 04/01/2019   Multiple sclerosis exacerbation (HCC) 11/16/2018   Exacerbation of multiple sclerosis (HCC)  11/16/2018   Elevated troponin 09/26/2018   Acute hypokalemia 09/25/2018   Arthritis of carpometacarpal joint 03/27/2018   Nocturnal leg cramps 03/26/2018   Syncope due to orthostatic hypotension 02/12/2018   Diarrhea 02/08/2018   Urinary tract infection without hematuria 02/08/2018   Hematoma of scalp 02/08/2018   Neck pain 01/20/2018   Foreign body in esophagus    Esophageal stricture    Intractable nausea and vomiting 01/31/2017   Dysphagia    Severe protein-calorie malnutrition (HCC)    Potassium disorder 01/30/2017   Cystitis 01/30/2017   Periumbilical hernia 11/28/2016   Dysuria 11/28/2016   Calculus of gallbladder with acute on chronic cholecystitis without obstruction 11/28/2016   Costochondritis 05/11/2016   Chronic chest wall pain 04/17/2016   Trochanteric bursitis of both hips 03/28/2016   Mild intermittent asthma without complication 11/10/2015   Bilateral low back pain with bilateral sciatica 07/13/2015   Lumbar radicular pain 03/21/2015   Other fatigue 01/11/2015   Dysesthesia 01/11/2015   Urinary urgency 01/11/2015   Relapsing remitting multiple sclerosis (HCC) 10/06/2014   Numbness 10/06/2014   Ataxic gait 10/06/2014   High risk medication use 10/06/2014   Gastric bypass status for obesity 10/06/2014   Cognitive changes 10/06/2014   Depression with anxiety 10/06/2014   Restless leg 10/06/2014   Insomnia 10/06/2014   Essential hypertension 10/06/2014   Multiple sclerosis (HCC) 02/16/2014   Difficulty hearing 01/13/2014   Other muscle spasm 01/13/2014   Hearing loss 01/13/2014       Phylliss Blakes, MD [facility]  See AMION to  contact appropriate on-call provider

## 2023-06-19 NOTE — ED Provider Notes (Addendum)
[place] [facility] DEPARTMENT AT [facility] Provider Note   CSN: [id number] Arrival date & time: 06/19/23  0118     History  Chief Complaint  Patient presents with   Catherine Hubbard    Catherine Hubbard is a 62 y.o. female.  Patient with a history of multiple sclerosis, hypertension, asthma, pancreatitis presenting with hematoma and bruising to her right chest and axilla.  States she was walking to the bathroom without her cane as she is supposed to use.  She had difficulty getting there and was trying to grab onto furniture in the wall.  Her husband was behind her and caught her underneath the arms to prevent her from falling.  She was lowered to the ground but did not fall or hit her head.  She sustained a large bruise and hematoma to her right axilla and chest wall.  Complains of pain in this area only.  Denies any direct trauma.  Denies hitting her head.  No neck or back pain.  No chest pain or shortness of breath.  No abdominal pain, nausea or vomiting.  No focal weakness, numbness or tingling.  No preceding dizziness or lightheadedness.  States she had fluid drained from her left knee last week and became acutely sore tonight while she was walking.  It is not hurting her currently.  Denies any blood thinner use  The history is provided by the patient.  Fall Pertinent negatives include no chest pain, no abdominal pain and no headaches.       Home Medications Prior to Admission medications   Medication Sig Start Date End Date Taking? Authorizing Provider  acetaminophen (TYLENOL) 500 MG tablet Take 500 mg by mouth daily as needed (pain).    [provider]  albuterol (PROVENTIL) (2.5 MG/3ML) 0.083% nebulizer solution Take 3 mLs (2.5 mg total) by nebulization every 6 (six) hours as needed for wheezing or shortness of breath. Patient taking differently: Take 2.5 mg by nebulization as needed for wheezing or shortness of breath. 11/26/19   Saguier, Ramon Dredge, PA-C   albuterol (VENTOLIN HFA) 108 (90 Base) MCG/ACT inhaler Inhale into the lungs. 06/29/16   [provider]  amitriptyline (ELAVIL) 10 MG tablet TAKE 1 TABLET BY MOUTH EVERYDAY AT BEDTIME 03/25/23   Raulkar, Drema Pry, MD  Ascorbic Acid (VITAMIN C PO) Take by mouth. 1 tablet daily    [provider]  Aspirin-Acetaminophen-Caffeine (EXCEDRIN PO) Take 1 tablet by mouth as needed (headache).    [provider]  B Complex Vitamins (VITAMIN B COMPLEX PO) Take by mouth. 1 daily    [provider]  baclofen (LIORESAL) 10 MG tablet Take 1 tablet (10 mg total) by mouth 3 (three) times daily as needed for muscle spasms. 05/01/22   Sater, Pearletha Furl, MD  bismuth subsalicylate (PEPTO BISMOL) 262 MG/15ML suspension Take 30 mLs by mouth daily as needed (acid reflux).    [provider]  budesonide-formoterol (SYMBICORT) 80-4.5 MCG/ACT inhaler TAKE 2 PUFFS BY MOUTH TWICE A DAY 08/06/22   Saguier, Ramon Dredge, PA-C  Calcium Carbonate Antacid (TUMS PO) Take 2 tablets by mouth 2 (two) times daily as needed (acid reflux).    [provider]  Cholecalciferol (D2000 ULTRA STRENGTH) 50 MCG (2000 UT) CAPS Take 2,000 Units by mouth daily.    [provider]  Cyanocobalamin 1000 MCG TBCR Take by mouth. 01/13/14   [provider]  diclofenac Sodium (VOLTAREN) 1 % GEL Apply 4 g topically 4 (four) times daily. 06/21/22  Horton Chin, MD  fluticasone (FLONASE) 50 MCG/ACT nasal spray SPRAY 2 SPRAYS INTO EACH NOSTRIL EVERY DAY 10/23/22   Saguier, Ramon Dredge, PA-C  furosemide (LASIX) 20 MG tablet Take 1 tablet (20 mg total) by mouth daily. For the next 3 days 06/14/23   Alfredia Ferguson, PA-C  gabapentin (NEURONTIN) 100 MG capsule TAKE 1 CAPSULE BY MOUTH THREE TIMES A DAY 10/09/22   Saguier, Ramon Dredge, PA-C  IRON CR PO Take by mouth. 1 tablet daily    [provider]  Iron, Ferrous Sulfate, 325 (65 Fe) MG TABS 1 tab po bid 11/09/22   Saguier, Ramon Dredge, PA-C  meloxicam  (MOBIC) 7.5 MG tablet Take 1 tablet (7.5 mg total) by mouth daily. 12/17/22   Raulkar, Drema Pry, MD  modafinil (PROVIGIL) 200 MG tablet Take one po qAM and 1/2 po qPM 05/20/23   Sater, Pearletha Furl, MD  Multiple Vitamin (MULTIVITAMIN WITH MINERALS) TABS tablet Take 1 tablet by mouth daily.    [provider]  mupirocin ointment (BACTROBAN) 2 % Apply 1 Application topically 2 (two) times daily. 04/06/22   Donato Schultz, DO  nystatin cream (MYCOSTATIN) Apply 1 Application topically 2 (two) times daily. 10/11/22   Saguier, Ramon Dredge, PA-C  ocrelizumab (OCREVUS) 300 MG/10ML injection INFUSE 600MG  INTRAVENOUSLY AS DIRECTED EVERY 6 MONTHS. 04/16/23   Sater, Pearletha Furl, MD  olmesartan (BENICAR) 5 MG tablet 2 tab po q day 11/05/22   Saguier, Ramon Dredge, PA-C  omeprazole (PRILOSEC) 20 MG capsule Take 40 mg by mouth 2 (two) times daily before a meal. 07/09/20   [provider]  ondansetron (ZOFRAN) 4 MG tablet TAKE 1 TABLET BY MOUTH EVERY 8 HOURS AS NEEDED FOR NAUSEA AND VOMITING 06/17/23   Saguier, Ramon Dredge, PA-C  oxybutynin (DITROPAN) 5 MG tablet Take 1 tablet (5 mg total) by mouth 2 (two) times daily. 05/01/22   Sater, Pearletha Furl, MD  Probiotic Product (PROBIOTIC DAILY PO) Take by mouth. 1 daily    [provider]  Thiamine HCl (THIAMINE PO) Take by mouth. 01/13/14   [provider]      Allergies    Ibuprofen and Nsaids    Review of Systems   Review of Systems  Constitutional:  Negative for activity change, appetite change and fever.  HENT:  Negative for congestion and rhinorrhea.   Respiratory:  Negative for cough and chest tightness.   Cardiovascular:  Negative for chest pain.  Gastrointestinal:  Negative for abdominal pain, nausea and vomiting.  Genitourinary:  Negative for dysuria and hematuria.  Musculoskeletal:  Positive for arthralgias and myalgias.  Skin:  Negative for rash.  Neurological:  Negative for dizziness, weakness and headaches.   all other systems are  negative except as noted in the HPI and PMH.    Physical Exam Updated Vital Signs BP 93/71   Pulse 82   Temp 98.3 F (36.8 C) (Oral)   Resp 20   Ht 5\' 5"  (1.651 m)   Wt 77.2 kg   LMP 08/13/2014 Comment: very few periods  SpO2 97%   BMI 28.32 kg/m  Physical Exam Vitals and nursing note reviewed.  Constitutional:      General: She is not in acute distress.    Appearance: She is well-developed.  HENT:     Head: Normocephalic and atraumatic.     Mouth/Throat:     Pharynx: No oropharyngeal exudate.  Eyes:     Conjunctiva/sclera: Conjunctivae normal.     Pupils: Pupils are equal, round, and reactive to light.  Neck:     Comments: No meningismus. Cardiovascular:     Rate and Rhythm: Normal rate and regular rhythm.     Heart sounds: Normal heart sounds. No murmur heard. Pulmonary:     Effort: Pulmonary effort is normal. No respiratory distress.     Breath sounds: Normal breath sounds.     Comments: Chaperone present Psychologist, educational.  Patient with large area of ecchymosis and hematoma to right axilla and lateral chest wall.  Approximately 10 cm of firm hematoma. Chest:     Chest wall: Tenderness present.  Abdominal:     Palpations: Abdomen is soft.     Tenderness: There is no abdominal tenderness. There is no guarding or rebound.  Musculoskeletal:        General: No tenderness. Normal range of motion.     Cervical back: Normal range of motion and neck supple.     Comments: Full range of motion of knees knees bilaterally without warmth erythema  Skin:    General: Skin is warm.  Neurological:     Mental Status: She is alert and oriented to person, place, and time.     Cranial Nerves: No cranial nerve deficit.     Motor: No abnormal muscle tone.     Coordination: Coordination normal.     Comments:  5/5 strength throughout. CN 2-12 intact.Equal grip strength.   Psychiatric:        Behavior: Behavior normal.     ED Results / Procedures / Treatments   Labs (all labs ordered  are listed, but only abnormal results are displayed) Labs Reviewed  BASIC METABOLIC PANEL - Abnormal; Notable for the following components:      Result Value   Sodium 129 (*)    Potassium 2.8 (*)    Chloride 91 (*)    Glucose, Bld 102 (*)    Anion gap 16 (*)    All other components within normal limits  CBC - Abnormal; Notable for the following components:   RBC 3.50 (*)    Hemoglobin 11.7 (*)    HCT 33.5 (*)    All other components within normal limits  CBC WITH DIFFERENTIAL/PLATELET - Abnormal; Notable for the following components:   WBC 10.6 (*)    RBC 3.17 (*)    Hemoglobin 10.7 (*)    HCT 30.3 (*)    Monocytes Absolute 1.2 (*)    All other components within normal limits  PROTIME-INR  HIV ANTIBODY (ROUTINE TESTING W REFLEX)  TYPE AND SCREEN  ABO/RH  TROPONIN I (HIGH SENSITIVITY)  TROPONIN I (HIGH SENSITIVITY)    EKG EKG Interpretation Date/Time:  Wednesday June 19 2023 01:42:07 EDT Ventricular Rate:  107 PR Interval:  154 QRS Duration:  100 QT Interval:  370 QTC Calculation: 493 R Axis:   180  Text Interpretation: Sinus tachycardia Left posterior fascicular block Abnormal ECG When compared with ECG of 28-Mar-2022 23:41, PREVIOUS ECG IS PRESENT new LPFB Confirmed by Glynn Octave [phone]) on 06/19/2023 3:58:42 AM  Radiology DG Knee Complete 4 Views Left  Result Date: 06/19/2023 CLINICAL DATA:  Fall with bruising to left knee EXAM: LEFT KNEE - COMPLETE 4 VIEW COMPARISON:  Six days prior FINDINGS: Hyperextended appearance of the knee. There is a subtle lucency through the medial supracondylar femur seen on at least two views. Subjective osteopenia. Large knee joint effusion. IMPRESSION: 1. Possible supracondylar femur fracture, recommend CT without contrast. 2. Knee joint effusion. Electronically Signed   By: Tiburcio Pea M.D.   On:  06/19/2023 06:40   DG Shoulder Right  Result Date: 06/19/2023 CLINICAL DATA:  Fall with right axillary bruising. EXAM: RIGHT  SHOULDER - 3 VIEW COMPARISON:  Chest CT performed at the same time. FINDINGS: Remote right femoral neck fracture with healed posterior impaction. No indication of acute fracture or dislocation. Degenerative spurring at the glenohumeral and acromioclavicular joints. IMPRESSION: 1. No acute finding. 2. Remote fracture of the right humeral neck. Electronically Signed   By: Tiburcio Pea M.D.   On: 06/19/2023 06:37   CT Chest W Contrast  Result Date: 06/19/2023 CLINICAL DATA:  62 year old female with history of blunt trauma to the right chest wall from a fall. Hematoma. EXAM: CT CHEST WITH CONTRAST TECHNIQUE: Multidetector CT imaging of the chest was performed during intravenous contrast administration. RADIATION DOSE REDUCTION: This exam was performed according to the departmental dose-optimization program which includes automated exposure control, adjustment of the mA and/or kV according to patient size and/or use of iterative reconstruction technique. CONTRAST:  50mL OMNIPAQUE IOHEXOL 350 MG/ML SOLN COMPARISON:  Chest CT 04/14/2019. FINDINGS: Cardiovascular: No abnormal high attenuation fluid within the mediastinum to suggest posttraumatic mediastinal hematoma. No evidence of posttraumatic aortic dissection/transection. Heart size is normal. There is no significant pericardial fluid, thickening or pericardial calcification. There is aortic atherosclerosis, as well as atherosclerosis of the great vessels of the mediastinum and the coronary arteries, including calcified atherosclerotic plaque in the left anterior descending coronary artery. Mediastinum/Nodes: No pathologically enlarged mediastinal or hilar lymph nodes. Moderate-sized hiatal hernia. No axillary lymphadenopathy. Lungs/Pleura: No pneumothorax. Some patchy ground-glass attenuation is noted throughout the right lung, potentially areas of alveolar hemorrhage from mild pulmonary contusion or inflammation as sequela of aspiration. No confluent  consolidative airspace disease. No definite suspicious appearing pulmonary nodules or masses are noted. No pleural effusions. Upper Abdomen: Postoperative changes of sleeve gastrectomy. Status post cholecystectomy. Musculoskeletal: Extensive soft tissue stranding throughout the right breast. In the lateral aspect of the right breast there is a large collection of material which is heterogeneous in attenuation but generally intermediate to high attenuation, likely a hematoma estimated to measure approximately 6.5 x 7.8 x 4.8 cm (estimated volume of 122 mL). Within this collection there is a serpiginous focus of high attenuation within ill-defined inferior margin, likely to reflect active extravasation. Chronic appearing compression fracture of T8 with proximally 70% loss of anterior vertebral body height. Old healed fracture of the inferior aspect of the manubrium with mild posttraumatic deformity. Multiple old healed bilateral rib fractures. No definite acute displaced fractures or aggressive appearing lytic or blastic lesions are noted in the visualized portions of the skeleton. IMPRESSION: 1. Large predominantly intermediate to high attenuation collection in the right breast likely to represent a large hematoma, with evidence of active extravasation within the collection, as detailed above. The possibility of active bleeding into a pre-existing breast mass is not excluded, but not strongly favored given the patient's history. 2. Patchy areas of ground-glass attenuation in the right lung, which may reflect mild alveolar hemorrhage from pulmonary contusion or acute inflammation in the setting of mild aspiration. No other signs of significant acute intrathoracic trauma. 3. Moderate-sized hiatal hernia. 4. Aortic atherosclerosis, in addition to left anterior descending coronary artery disease. Please note that although the presence of coronary artery calcium documents the presence of coronary artery disease, the  severity of this disease and any potential stenosis cannot be assessed on this non-gated CT examination. Assessment for potential risk factor modification, dietary therapy or pharmacologic therapy may be warranted, if  clinically indicated. 5. Additional incidental findings, as above. Critical Value/emergent results were called by telephone at the time of interpretation on 06/19/2023 at 6:15 am to provider [facility], who verbally acknowledged these results. Aortic Atherosclerosis (ICD10-I70.0). Electronically Signed   By: Trudie Reed M.D.   On: 06/19/2023 06:15   DG Chest 2 View  Result Date: 06/19/2023 CLINICAL DATA:  Fall pain under the right breast EXAM: CHEST - 2 VIEW COMPARISON:  04/15/2023 FINDINGS: Low lung volumes. Mild heterogeneous airspace disease at the bases. Normal cardiac size. No pneumothorax. Lucency beneath the right diaphragm, probable high-riding bowel. Moderate severe compression deformity of mid to lower thoracic vertebra. This is unchanged. IMPRESSION: 1. Low lung volumes with mild heterogeneous airspace disease at the bases, atelectasis versus pneumonia. 2. Lucency beneath the right diaphragm, favored to represent air-filled high-riding bowel. Electronically Signed   By: Jasmine Pang M.D.   On: 06/19/2023 03:36    Procedures .Critical Care  Performed by: Glynn Octave, MD Authorized by: Glynn Octave, MD   Critical care provider statement:    Critical care time (minutes):  35   Critical care time was exclusive of:  Separately billable procedures and treating other patients   Critical care was necessary to treat or prevent imminent or life-threatening deterioration of the following conditions:  Trauma   Critical care was time spent personally by me on the following activities:  Development of treatment plan with patient or surrogate, discussions with consultants, evaluation of patient's response to treatment, examination of patient, ordering and review of  laboratory studies, ordering and review of radiographic studies, ordering and performing treatments and interventions, pulse oximetry, re-evaluation of patient's condition, review of old charts, blood draw for specimens and obtaining history from patient or surrogate   I assumed direction of critical care for this patient from another provider in my specialty: no     Care discussed with: admitting provider       Medications Ordered in ED Medications  iohexol (OMNIPAQUE) 350 MG/ML injection 50 mL (has no administration in time range)  fentaNYL (SUBLIMAZE) injection 50 mcg (50 mcg Intravenous Given 06/19/23 0530)    ED Course/ Medical Decision Making/ A&P                                 Medical Decision Making Amount and/or Complexity of Data Reviewed Labs: ordered. Decision-making details documented in ED Course. Radiology: ordered and independent interpretation performed. Decision-making details documented in ED Course. ECG/medicine tests: ordered and independent interpretation performed. Decision-making details documented in ED Course.  Risk Prescription drug management. Decision regarding hospitalization.   Hematoma to R breast and chest wall. No direct fall or trauma. Stable vitals. Equal breath sounds.   Xray in triage is negative for rib fracture or pneumothorax. Results reviewed and interpreted by me.   Hemoglobin near baseline at 11.7. CT imaging is obtained to assess hematoma and evaluate for active bleeding.  D/w Dr. Llana Aliment. Large hematoma with active extravasation.   D/w Dr. Doylene Canard of trauma surgery. She will evaluate and likely admit. Does not feel any need to contact IR for potential embolization at this point.   BP improved from 90s back to 120s systolic.  Dr. Doylene Canard to admit to trauma service for monitoring.    Addendum: questionable supracondylar femur fracture on L knee Xray. Ordering CT knee per radiology recommendations. Updated Dr. Hillery Hunter of trauma  service.  Final Clinical Impression(s) / ED Diagnoses Final diagnoses:  Hematoma of right chest wall, initial encounter    Rx / DC Orders ED Discharge Orders     None         Marilouise Densmore, Jeannett Senior, MD 06/19/23 0981    Glynn Octave, MD 06/19/23 [phone]

## 2023-06-19 NOTE — ED Triage Notes (Signed)
Patient BIB [facility] with complaint of GLF, reports was not using [facility] at time of fall. EMS called for lift assist.   Patient has large bruising under right arm, per patient bruising not associated with this fall, reports unknown origin.   Denies Blood thinners  Denies head injury, denies LOC.

## 2023-06-19 NOTE — Evaluation (Signed)
Physical Therapy Evaluation Patient Details Name: Catherine Hubbard MRN: [id number] DOB: 27-Jun-1961 Today's Date: 06/19/2023  History of Present Illness  62yo F who presented to the ED on 06/19/23 after a fall at home. CT shows large right axillary/breast hematoma with extravasion. Team had some concern for possible occult fracture in LE, but CT without contrast showed no evidence of acute fracture. PMH alcohol abuse, asthma, hearing loss, HTN, MS, vision abnormalities, B ankle fx surgery, B carpal tunnel release, cholecystectomy, ulnar nerve repair  Clinical Impression   Pt received in bed, pleasant and cooperative but somewhat impulsive- may be her baseline- but followed commands well. Generally able to mobilize with S with RW, did need up to Min-Mod cues for safety with mobility at times but no significant unsteadiness noted when using device. Left in bed with all needs met, will likely benefit from appropriate skilled PT f/u after DC from the hospital.         If plan is discharge home, recommend the following: A little help with walking and/or transfers;Assistance with cooking/housework;Assist for transportation;A little help with bathing/dressing/bathroom;Help with stairs or ramp for entrance   Can travel by private vehicle        Equipment Recommendations None recommended by PT (well equipped)  Recommendations for Other Services       Functional Status Assessment Patient has had a recent decline in their functional status and demonstrates the ability to make significant improvements in function in a reasonable and predictable amount of time.     Precautions / Restrictions Precautions Precautions: Fall;Other (comment) Precaution Comments: large R axillary hematoma Restrictions Weight Bearing Restrictions: No      Mobility  Bed Mobility Overal bed mobility: Modified Independent             General bed mobility comments: HOB elevated, use of rails     Transfers Overall transfer level: Needs assistance Equipment used: Rolling walker (2 wheels) Transfers: Sit to/from Stand, Bed to chair/wheelchair/BSC Sit to Stand: Supervision Stand pivot transfers: Supervision         General transfer comment: general S with cues for hand placement/safety with transfers but no unsteadiness noted    Ambulation/Gait Ambulation/Gait assistance: Supervision Gait Distance (Feet): 140 Feet Assistive device: Rolling walker (2 wheels) Gait Pattern/deviations: Step-through pattern, Trunk flexed Gait velocity: decreased     General Gait Details: slow but steady with BUE support on RW, no unsteadiness noted with device  Stairs            Wheelchair Mobility     Tilt Bed    Modified Rankin (Stroke Patients Only)       Balance Overall balance assessment: History of Falls                                           Pertinent Vitals/Pain Pain Assessment Pain Assessment: 0-10 Pain Score: 4  Pain Location: area of hematoma Pain Descriptors / Indicators: Discomfort, Aching Pain Intervention(s): Limited activity within patient's tolerance, Monitored during session    Home Living Family/patient expects to be discharged to:: Private residence Living Arrangements: Spouse/significant other Available Help at Discharge: Family;Available 24 hours/day Type of Home: House Home Access: Stairs to enter Entrance Stairs-Rails: None Entrance Stairs-Number of Steps: 1+1   Home Layout: One level Home Equipment: Agricultural consultant (2 wheels);Rollator (4 wheels);BSC/3in1      Prior Function Prior Level of Function :  Independent/Modified Independent                     Extremity/Trunk Assessment   Upper Extremity Assessment Upper Extremity Assessment: Defer to OT evaluation    Lower Extremity Assessment Lower Extremity Assessment: Generalized weakness    Cervical / Trunk Assessment Cervical / Trunk Assessment:  Kyphotic  Communication   Communication Communication: Hearing impairment Cueing Techniques: Verbal cues  Cognition Arousal: Alert Behavior During Therapy: WFL for tasks assessed/performed Overall Cognitive Status: Within Functional Limits for tasks assessed                                 General Comments: somewhat impulsive but this may be her baseline, family not present to clarify        General Comments      Exercises     Assessment/Plan    PT Assessment Patient needs continued PT services  PT Problem List Decreased strength;Decreased balance;Decreased mobility;Decreased activity tolerance;Decreased coordination;Decreased safety awareness       PT Treatment Interventions DME instruction;Therapeutic activities;Gait training;Therapeutic exercise;Patient/family education;Stair training;Balance training;Functional mobility training;Neuromuscular re-education    PT Goals (Current goals can be found in the Care Plan section)  Acute Rehab PT Goals Patient Stated Goal: go home PT Goal Formulation: With patient Time For Goal Achievement: 07/03/23 Potential to Achieve Goals: Good    Frequency Min 1X/week     Co-evaluation               AM-PAC PT "6 Clicks" Mobility  Outcome Measure Help needed turning from your back to your side while in a flat bed without using bedrails?: None Help needed moving from lying on your back to sitting on the side of a flat bed without using bedrails?: None Help needed moving to and from a bed to a chair (including a wheelchair)?: None Help needed standing up from a chair using your arms (e.g., wheelchair or bedside chair)?: None Help needed to walk in hospital room?: A Little Help needed climbing 3-5 steps with a railing? : A Little 6 Click Score: 22    End of Session   Activity Tolerance: Patient tolerated treatment well Patient left: in bed;with call bell/phone within reach Nurse Communication: Mobility status PT  Visit Diagnosis: Muscle weakness (generalized) (M62.81);History of falling (Z91.81);Unsteadiness on feet (R26.81)    Time: 2130-8657 PT Time Calculation (min) (ACUTE ONLY): 26 min   Charges:   PT Evaluation $PT Eval Moderate Complexity: 1 Mod PT Treatments $Gait Training: 8-22 mins PT General Charges $$ ACUTE PT VISIT: 1 Visit        Nedra Hai, PT, DPT 06/19/23 3:49 PM

## 2023-06-19 NOTE — TOC CAGE-AID Note (Addendum)
Transition of Care [facility]) - CAGE-AID Screening   Patient Details  Name: Catherine Hubbard MRN: [id number] Date of Birth: 1961-08-15  Transition of Care [facility]) CM/SW Contact:    Katha Hamming, RN Phone Number: 06/19/2023, 8:38 PM   CAGE-AID Screening:    Have You Ever Felt You Ought to Cut Down on Your Drinking or Drug Use?: Yes Have People Annoyed You By Critizing Your Drinking Or Drug Use?: Yes Have You Felt Bad Or Guilty About Your Drinking Or Drug Use?: No Have You Ever Had a Drink or Used Drugs First Thing In The Morning to Steady Your Nerves or to Get Rid of a Hangover?: No CAGE-AID Score: 2  Substance Abuse Education Offered: Yes (pt declined)

## 2023-06-19 NOTE — Progress Notes (Signed)
PT Cancellation Note  Patient Details Name: TRIESTE WOOTERS MRN: [id number] DOB: 1961-05-23   Cancelled Treatment:    Reason Eval/Treat Not Completed: Medical issues which prohibited therapy still awaiting imaging results for fracture r/o as well as WB status. Will return when appropriate as as time/schedule allows.   Nedra Hai, PT, DPT 06/19/23 2:17 PM

## 2023-06-20 DIAGNOSIS — S2001XA Contusion of right breast, initial encounter: Secondary | ICD-10-CM | POA: Diagnosis not present

## 2023-06-20 LAB — CBC
HCT: 29.8 % — ABNORMAL LOW (ref 36.0–46.0)
Hemoglobin: 10.4 g/dL — ABNORMAL LOW (ref 12.0–15.0)
MCH: 33.5 pg (ref 26.0–34.0)
MCHC: 34.9 g/dL (ref 30.0–36.0)
MCV: 96.1 fL (ref 80.0–100.0)
Platelets: 277 10*3/uL (ref 150–400)
RBC: 3.1 MIL/uL — ABNORMAL LOW (ref 3.87–5.11)
RDW: 11.9 % (ref 11.5–15.5)
WBC: 6.8 10*3/uL (ref 4.0–10.5)
nRBC: 0 % (ref 0.0–0.2)

## 2023-06-20 LAB — BASIC METABOLIC PANEL
Anion gap: 13 (ref 5–15)
BUN: 10 mg/dL (ref 8–23)
CO2: 26 mmol/L (ref 22–32)
Calcium: 8.5 mg/dL — ABNORMAL LOW (ref 8.9–10.3)
Chloride: 92 mmol/L — ABNORMAL LOW (ref 98–111)
Creatinine, Ser: 0.74 mg/dL (ref 0.44–1.00)
GFR, Estimated: >60 mL/min (ref >60)
Glucose, Bld: 92 mg/dL (ref 70–99)
Potassium: 3.4 mmol/L — ABNORMAL LOW (ref 3.5–5.1)
Sodium: 131 mmol/L — ABNORMAL LOW (ref 135–145)

## 2023-06-20 MED ORDER — MELOXICAM 7.5 MG PO TABS
7.5000 mg | ORAL_TABLET | Freq: Every day | ORAL | Status: DC
  Administered 2023-06-20: 7.5 mg via ORAL
  Filled 2023-06-20: qty 1

## 2023-06-20 MED ORDER — ENOXAPARIN SODIUM 30 MG/0.3ML IJ SOSY: 30.0000 mg | PREFILLED_SYRINGE | Freq: Two times a day (BID) | INTRAMUSCULAR | Status: DC

## 2023-06-20 MED ORDER — OXYCODONE HCL 10 MG PO TABS: 5.0000 mg | ORAL_TABLET | Freq: Four times a day (QID) | ORAL | 0 refills | Status: DC | PRN

## 2023-06-20 MED ORDER — OXYBUTYNIN CHLORIDE 5 MG PO TABS: 5.0000 mg | ORAL_TABLET | Freq: Once | ORAL | Status: DC | PRN

## 2023-06-20 MED ORDER — AMITRIPTYLINE HCL 10 MG PO TABS
10.0000 mg | ORAL_TABLET | Freq: Every day | ORAL | Status: DC
  Filled 2023-06-20: qty 1

## 2023-06-20 MED ORDER — BACLOFEN 10 MG PO TABS: 10.0000 mg | ORAL_TABLET | Freq: Three times a day (TID) | ORAL | Status: DC | PRN

## 2023-06-20 MED ORDER — POLYETHYLENE GLYCOL 3350 17 G PO PACK: 17.0000 g | PACK | Freq: Every day | ORAL | Status: DC | PRN

## 2023-06-20 MED ORDER — PANTOPRAZOLE SODIUM 40 MG PO TBEC
40.0000 mg | DELAYED_RELEASE_TABLET | Freq: Every day | ORAL | Status: DC
  Administered 2023-06-20: 40 mg via ORAL
  Filled 2023-06-20: qty 1

## 2023-06-20 MED ORDER — DICLOFENAC SODIUM 1 % EX GEL
4.0000 g | Freq: Four times a day (QID) | CUTANEOUS | Status: DC | PRN
  Filled 2023-06-20: qty 100

## 2023-06-20 MED ORDER — ACETAMINOPHEN 500 MG PO TABS: 1000.0000 mg | ORAL_TABLET | Freq: Four times a day (QID) | ORAL | Status: AC | PRN

## 2023-06-20 MED ORDER — CALCIUM CARBONATE ANTACID 500 MG PO CHEW: 1.0000 | CHEWABLE_TABLET | Freq: Four times a day (QID) | ORAL | Status: DC | PRN

## 2023-06-20 MED ORDER — GABAPENTIN 100 MG PO CAPS: 100.0000 mg | ORAL_CAPSULE | Freq: Three times a day (TID) | ORAL | Status: DC

## 2023-06-20 MED ORDER — SIMETHICONE 80 MG PO CHEW
80.0000 mg | CHEWABLE_TABLET | Freq: Three times a day (TID) | ORAL | Status: DC | PRN
  Administered 2023-06-20: 80 mg via ORAL
  Filled 2023-06-20: qty 1

## 2023-06-20 MED ORDER — POTASSIUM CHLORIDE CRYS ER 20 MEQ PO TBCR
40.0000 meq | EXTENDED_RELEASE_TABLET | Freq: Two times a day (BID) | ORAL | Status: DC
  Administered 2023-06-20: 40 meq via ORAL
  Filled 2023-06-20: qty 2

## 2023-06-20 MED ORDER — MOMETASONE FURO-FORMOTEROL FUM 100-5 MCG/ACT IN AERO
2.0000 | INHALATION_SPRAY | Freq: Two times a day (BID) | RESPIRATORY_TRACT | Status: DC
  Administered 2023-06-20: 2 via RESPIRATORY_TRACT
  Filled 2023-06-20: qty 8.8

## 2023-06-20 NOTE — Progress Notes (Addendum)
Mobility Specialist Progress Note:    06/20/23 0918  Mobility  Activity Ambulated with assistance in room; Ambulated with assistance in hallway  Level of Assistance Contact guard assist, steadying assist  Assistive Device Front wheel walker  Distance Ambulated (ft) 45 ft  Activity Response Tolerated well  Mobility Referral Yes  $Mobility charge 1 Mobility  Mobility Specialist Start Time (ACUTE ONLY) [id number]  Mobility Specialist Stop Time (ACUTE ONLY) 0916  Mobility Specialist Time Calculation (min) (ACUTE ONLY) 11 min   Pt voided successfully, eager for mobility session. Ambulated in room and hallway with RW, pt deferred further mobility d/t drowsiness. Returned pt to bed, alarm on, call bell in reach, all needs met.   Feliciana Rossetti Mobility Specialist Please contact via Special educational needs teacher or  Rehab office at [phone]

## 2023-06-20 NOTE — TOC Transition Note (Signed)
Transition of Care [facility]) - CM/SW Discharge Note   Patient Details  Name: Catherine Hubbard MRN: [id number] Date of Birth: Jul 23, 1961  Transition of Care [facility]) CM/SW Contact:  Glennon Mac, RN Phone Number: 06/20/2023, 3:05 PM   Clinical Narrative:     62yo F who presented to the ED on 06/19/23 after a fall at home. CT shows large right axillary/breast hematoma with extravasion. Team had some concern for possible occult fracture in LE, but CT without contrast showed no evidence of acute fracture.  PTA, pt indpendent and living at home with spouse, who can provide needed assistance at dc.  PT recommending OP follow up; referral made to [facility] on [facility]. Patient has all needed DME at home.    Final next level of care: OP Rehab Barriers to Discharge: Barriers Resolved                         Discharge Plan and Services Additional resources added to the After Visit Summary for     Discharge Planning Services: CM Consult                                 Social Determinants of Health (SDOH) Interventions SDOH Screenings   Food Insecurity: No Food Insecurity (06/19/2023)  Housing: Low Risk  (06/19/2023)  Transportation Needs: No Transportation Needs (06/19/2023)  Utilities: Not At Risk (06/19/2023)  Depression (PHQ2-9): Low Risk  (05/10/2023)  Tobacco Use: Low Risk  (06/19/2023)     Readmission Risk Interventions     No data to display         Quintella Baton, RN, BSN  Trauma/Neuro ICU Case Manager [phone]

## 2023-06-20 NOTE — Plan of Care (Signed)
  Problem: Coping: Goal: Level of anxiety will decrease Outcome: Progressing   Problem: Safety: Goal: Ability to remain free from injury will improve Outcome: Progressing   Problem: Skin Integrity: Goal: Risk for impaired skin integrity will decrease Outcome: Progressing   

## 2023-06-20 NOTE — Evaluation (Signed)
Occupational Therapy Evaluation Patient Details Name: Catherine Hubbard MRN: [id number] DOB: 04-24-1961 Today's Date: 06/20/2023   History of Present Illness 61yo F who presented to the ED on 06/19/23 after a fall at home. CT shows large right axillary/breast hematoma with extravasion. Team had some concern for possible occult fracture in LE, but CT without contrast showed no evidence of acute fracture. PMH alcohol abuse, asthma, hearing loss, HTN, MS, vision abnormalities, B ankle fx surgery, B carpal tunnel release, cholecystectomy, ulnar nerve repair   Clinical Impression   Pt admitted for above, she has a R axillary hematoma but it does not restrict her strength or ROM, pain is present but delayed with use. Pt completing mobility with CGA to supervision and ADLs with CGA to setup assist. OOB assessment was limited due to onset of chest pain to which the patient stated was her bout of indigestion. Notified RN of events, based on current assessment pt not far from baseline. Acute OT to follow pt acutely to continue to promote safety and educated of falls safety. Anticipate no post acute OT needed.        If plan is discharge home, recommend the following: Help with stairs or ramp for entrance    Functional Status Assessment  Patient has had a recent decline in their functional status and demonstrates the ability to make significant improvements in function in a reasonable and predictable amount of time.  Equipment Recommendations  None recommended by OT    Recommendations for Other Services       Precautions / Restrictions        Mobility Bed Mobility Overal bed mobility: Modified Independent                  Transfers Overall transfer level: Needs assistance Equipment used: Rolling walker (2 wheels) Transfers: Sit to/from Stand Sit to Stand: Supervision                  Balance Overall balance assessment: History of Falls, Mild deficits observed, not  formally tested                                         ADL either performed or assessed with clinical judgement   ADL Overall ADL's : Needs assistance/impaired Eating/Feeding: Independent;Sitting   Grooming: Wash/dry face;Oral care;Standing;Supervision/safety   Upper Body Bathing: Sitting;Set up   Lower Body Bathing: Sitting/lateral leans;Set up   Upper Body Dressing : Sitting;Set up   Lower Body Dressing: Set up;Sitting/lateral leans   Toilet Transfer: Ambulation;Contact guard assist Toilet Transfer Details (indicate cue type and reason): based on room level ambulation Toileting- Clothing Manipulation and Hygiene: Sit to/from stand;Contact guard assist       Functional mobility during ADLs: Contact guard assist;Rolling walker (2 wheels) General ADL Comments: Pt ambulating in room with CGA to supervision, reported some chest pain from her usual indigestion and returned to bed before further intervention     Vision         Perception         Praxis         Pertinent Vitals/Pain Pain Assessment Pain Assessment: Faces Faces Pain Scale: Hurts little more Pain Location: area of hematoma Pain Descriptors / Indicators: Discomfort, Aching Pain Intervention(s): Limited activity within patient's tolerance, Monitored during session, Other (comment) (Pt asking for indigestion meds)     Extremity/Trunk Assessment Upper Extremity Assessment Upper  Extremity Assessment: Overall WFL for tasks assessed (BUEs WFL. Strong RUE despite hematoma, she reports delayed pain with excessive use)   Lower Extremity Assessment Lower Extremity Assessment: Generalized weakness   Cervical / Trunk Assessment Cervical / Trunk Assessment: Kyphotic   Communication Communication Communication: Hearing impairment (lip reads) Cueing Techniques: Verbal cues   Cognition Arousal: Alert Behavior During Therapy: WFL for tasks assessed/performed Overall Cognitive Status: Within  Functional Limits for tasks assessed                                       General Comments  Pt reports chest pains but notes it as her hx of indigestion, she became hot and feeling nauesous, assessed Bp but pt returned to a supine position (117/86). Notified RN    Exercises     Shoulder Instructions      Home Living Family/patient expects to be discharged to:: Private residence Living Arrangements: Spouse/significant other Available Help at Discharge: Family;Available 24 hours/day Type of Home: House   Entrance Stairs-Number of Steps: 1+1 Entrance Stairs-Rails: None Home Layout: One level     Bathroom Shower/Tub: Chief Strategy Officer: Standard Bathroom Accessibility: Yes   Home Equipment: Agricultural consultant (2 wheels);Rollator (4 wheels);BSC/3in1;Cane - quad          Prior Functioning/Environment Prior Level of Function : Independent/Modified Independent             Mobility Comments: Ind no AD, will use RW or cane "only if she has to" ADLs Comments: Mod i        OT Problem List: Impaired balance (sitting and/or standing);Other (comment) (hx of falling)      OT Treatment/Interventions: Self-care/ADL training;Therapeutic activities;Therapeutic exercise;Balance training;Patient/family education    OT Goals(Current goals can be found in the care plan section) Acute Rehab OT Goals Patient Stated Goal: To go home OT Goal Formulation: With patient Time For Goal Achievement: 07/04/23 Potential to Achieve Goals: Good ADL Goals Pt Will Perform Grooming: with modified independence;standing Pt Will Perform Lower Body Bathing: with modified independence;sit to/from stand Pt Will Perform Upper Body Dressing: standing;with modified independence Pt Will Perform Lower Body Dressing: with modified independence;sit to/from stand Pt Will Transfer to Toilet: with modified independence  OT Frequency: Min 1X/week    Co-evaluation               AM-PAC OT "6 Clicks" Daily Activity     Outcome Measure Help from another person eating meals?: None Help from another person taking care of personal grooming?: A Little Help from another person toileting, which includes using toliet, bedpan, or urinal?: A Little Help from another person bathing (including washing, rinsing, drying)?: A Little Help from another person to put on and taking off regular upper body clothing?: A Little Help from another person to put on and taking off regular lower body clothing?: A Little 6 Click Score: 19   End of Session Equipment Utilized During Treatment: Gait belt;Rolling walker (2 wheels) Nurse Communication: Mobility status  Activity Tolerance: Patient tolerated treatment well Patient left: in bed;with call bell/phone within reach;with bed alarm set  OT Visit Diagnosis: Unsteadiness on feet (R26.81);History of falling (Z91.81)                Time: 1124-1150 OT Time Calculation (min): 26 min Charges:  OT General Charges $OT Visit: 1 Visit OT Evaluation $OT Eval Moderate Complexity: 1 Mod OT Treatments $  Self Care/Home Management : 8-22 mins  06/20/2023  AB, OTR/L  Acute Rehabilitation Services  Office: [phone]   Tristan Schroeder 06/20/2023, 2:28 PM

## 2023-06-20 NOTE — Progress Notes (Signed)
Mobility Specialist Progress Note:    06/20/23 0906  Mobility  Activity Transferred to/from [facility]  Level of Assistance Modified independent, requires aide device or extra time  Assistive Device BSC  Distance Ambulated (ft) 2 ft  Activity Response Tolerated well  Mobility Referral Yes  $Mobility charge 1 Mobility  Mobility Specialist Start Time (ACUTE ONLY) [id number]  Mobility Specialist Stop Time (ACUTE ONLY) 0907  Mobility Specialist Time Calculation (min) (ACUTE ONLY) 2 min   Pt requested assistance B>BSC transfer. Required ModI using BSC and bed rails. Tolerated well, asx throughout. Informed pt to push call bell when finished to safely transfer back. All needs met, call bell in reach.   Feliciana Rossetti Mobility Specialist Please contact via Special educational needs teacher or  Rehab office at [phone]

## 2023-06-20 NOTE — Progress Notes (Signed)
Patient ID: Catherine Hubbard, female   DOB: 07-01-1961, 62 y.o.   MRN: [id number] [facility] Surgery Progress Note     Subjective: CC-  Right chest wall/breast hematoma sore but pain well controlled. Mobilized with therapies yesterday and denies lightheadedness or dizziness. Ongoing left knee pain/swelling. States that this is not new and she has had the knee drained multiple times.   Objective: Vital signs in last 24 hours: Temp:  [97.4 F (36.3 C)-98.6 F (37 C)] 98.6 F (37 C) (10/31 0804) Pulse Rate:  [66-81] 76 (10/31 0804) Resp:  [18] 18 (10/31 0804) BP: (102-123)/(69-77) 111/75 (10/31 0804) SpO2:  [94 %-100 %] 94 % (10/31 0804) Last BM Date : 06/19/23  Intake/Output from previous day: 10/30 0701 - 10/31 0700 In: -  Out: 450 [Urine:450] Intake/Output this shift: No intake/output data recorded.  PE: Gen:  Alert, NAD, pleasant HEENT: EOM's intact, pupils equal and round Card:  RRR, 2+ DP pulses Pulm:  CTAB, no W/R/R, rate and effort normal on room air Chest wall? Hematoma and extensive surrounding ecchymosis in the right lateral pectoral region between the axilla and the right breast, tender, no erythema or drainage Abd: Soft, NT/ND Ext:  calves soft and nontender Skin: no rashes noted, warm and dry  Lab Results:  Recent Labs    06/19/23 0654 06/20/23 0918  WBC 10.6* 6.8  HGB 10.7* 10.4*  HCT 30.3* 29.8*  PLT 268 277   BMET Recent Labs    06/19/23 0205  NA 129*  K 2.8*  CL 91*  CO2 22  GLUCOSE 102*  BUN 13  CREATININE 0.86  CALCIUM 9.1   PT/INR Recent Labs    06/19/23 0654  LABPROT 14.0  INR 1.1   CMP     Component Value Date/Time   NA 129 (L) 06/19/2023 0205   NA 140 06/02/2019 1549   K 2.8 (L) 06/19/2023 0205   CL 91 (L) 06/19/2023 0205   CO2 22 06/19/2023 0205   GLUCOSE 102 (H) 06/19/2023 0205   BUN 13 06/19/2023 0205   BUN 15 06/02/2019 1549   CREATININE 0.86 06/19/2023 0205   CREATININE 0.72 12/07/2022 1457    CREATININE 0.62 11/27/2018 1505   CALCIUM 9.1 06/19/2023 0205   PROT 6.6 04/15/2023 1649   PROT 6.8 06/02/2019 1549   ALBUMIN 4.0 04/15/2023 1649   ALBUMIN 4.3 06/02/2019 1549   AST 30 04/15/2023 1649   AST 17 12/07/2022 1457   ALT 11 04/15/2023 1649   ALT 9 12/07/2022 1457   ALKPHOS 76 04/15/2023 1649   BILITOT 1.2 04/15/2023 1649   BILITOT 0.5 12/07/2022 1457   GFRNONAA >60 06/19/2023 0205   GFRNONAA >60 12/07/2022 1457   GFRAA 100 06/02/2019 1549   Lipase     Component Value Date/Time   LIPASE 117 (H) 01/06/2019 0235       Studies/Results: CT Knee Left Wo Contrast  Result Date: 06/19/2023 CLINICAL DATA:  Knee trauma.  Occult fracture suspected. EXAM: CT OF THE LEFT KNEE WITHOUT CONTRAST TECHNIQUE: Multidetector CT imaging of the left knee was performed according to the standard protocol. Multiplanar CT image reconstructions were also generated. RADIATION DOSE REDUCTION: This exam was performed according to the departmental dose-optimization program which includes automated exposure control, adjustment of the mA and/or kV according to patient size and/or use of iterative reconstruction technique. COMPARISON:  Left knee radiographs 06/19/2023, 06/13/2023; 06/12/2023, 03/13/2023 FINDINGS: Bones/Joint/Cartilage There is diffuse decreased bone mineralization. Moderate medial coronoid joint space narrowing. Mild peripheral medial compartment  and mild-to-moderate peripheral lateral compartment degenerative osteophytes. There is a 4 mm ossicle at the posterior aspect of the medial compartment, likely a small loose body (axial image 115 and sagittal image 70). Mild patellofemoral joint space narrowing and peripheral predominantly superior and lateral patellar degenerative osteophytes. No acute fracture is seen. Ligaments Suboptimally assessed by CT. Muscles and Tendons The quadriceps and patellar tendons appear intact. Normal size and density of the regional musculature. Soft tissues There is  a moderate joint effusion with internal density of 18 Hounsfield units, which may be due to proteinaceous or hemosiderin contents. However, no layering nondependent fat level is seen to specifically indicate secondary signs of an acute fracture. Minimal synovial thickening. Mild prepatellar subcutaneous fat density may be secondary to scarring or a pressure lesion. No walled-off fluid collection is seen. IMPRESSION: 1. Within the limitations of diffuse decreased bone mineralization, no acute fracture is seen. 2. Moderate joint effusion with mildly increased internal density of 18 Hounsfield units, which may be due to proteinaceous or hemosiderin contents. However, no layering nondependent fat level is seen to specifically indicate secondary signs of an acute fracture. 3. 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 4. There is a 4 mm ossicle at the posterior aspect of the medial compartment, likely a small loose body. Electronically Signed   By: Neita Garnet M.D.   On: 06/19/2023 14:44   DG Knee Complete 4 Views Left  Result Date: 06/19/2023 CLINICAL DATA:  Fall with bruising to left knee EXAM: LEFT KNEE - COMPLETE 4 VIEW COMPARISON:  Six days prior FINDINGS: Hyperextended appearance of the knee. There is a subtle lucency through the medial supracondylar femur seen on at least two views. Subjective osteopenia. Large knee joint effusion. IMPRESSION: 1. Possible supracondylar femur fracture, recommend CT without contrast. 2. Knee joint effusion. Electronically Signed   By: Tiburcio Pea M.D.   On: 06/19/2023 06:40   DG Shoulder Right  Result Date: 06/19/2023 CLINICAL DATA:  Fall with right axillary bruising. EXAM: RIGHT SHOULDER - 3 VIEW COMPARISON:  Chest CT performed at the same time. FINDINGS: Remote right femoral neck fracture with healed posterior impaction. No indication of acute fracture or dislocation. Degenerative spurring at the glenohumeral and  acromioclavicular joints. IMPRESSION: 1. No acute finding. 2. Remote fracture of the right humeral neck. Electronically Signed   By: Tiburcio Pea M.D.   On: 06/19/2023 06:37   CT Chest W Contrast  Result Date: 06/19/2023 CLINICAL DATA:  62 year old female with history of blunt trauma to the right chest wall from a fall. Hematoma. EXAM: CT CHEST WITH CONTRAST TECHNIQUE: Multidetector CT imaging of the chest was performed during intravenous contrast administration. RADIATION DOSE REDUCTION: This exam was performed according to the departmental dose-optimization program which includes automated exposure control, adjustment of the mA and/or kV according to patient size and/or use of iterative reconstruction technique. CONTRAST:  50mL OMNIPAQUE IOHEXOL 350 MG/ML SOLN COMPARISON:  Chest CT 04/14/2019. FINDINGS: Cardiovascular: No abnormal high attenuation fluid within the mediastinum to suggest posttraumatic mediastinal hematoma. No evidence of posttraumatic aortic dissection/transection. Heart size is normal. There is no significant pericardial fluid, thickening or pericardial calcification. There is aortic atherosclerosis, as well as atherosclerosis of the great vessels of the mediastinum and the coronary arteries, including calcified atherosclerotic plaque in the left anterior descending coronary artery. Mediastinum/Nodes: No pathologically enlarged mediastinal or hilar lymph nodes. Moderate-sized hiatal hernia. No axillary lymphadenopathy. Lungs/Pleura: No pneumothorax. Some patchy ground-glass attenuation is noted throughout the right lung, potentially areas  of alveolar hemorrhage from mild pulmonary contusion or inflammation as sequela of aspiration. No confluent consolidative airspace disease. No definite suspicious appearing pulmonary nodules or masses are noted. No pleural effusions. Upper Abdomen: Postoperative changes of sleeve gastrectomy. Status post cholecystectomy. Musculoskeletal: Extensive soft  tissue stranding throughout the right breast. In the lateral aspect of the right breast there is a large collection of material which is heterogeneous in attenuation but generally intermediate to high attenuation, likely a hematoma estimated to measure approximately 6.5 x 7.8 x 4.8 cm (estimated volume of 122 mL). Within this collection there is a serpiginous focus of high attenuation within ill-defined inferior margin, likely to reflect active extravasation. Chronic appearing compression fracture of T8 with proximally 70% loss of anterior vertebral body height. Old healed fracture of the inferior aspect of the manubrium with mild posttraumatic deformity. Multiple old healed bilateral rib fractures. No definite acute displaced fractures or aggressive appearing lytic or blastic lesions are noted in the visualized portions of the skeleton. IMPRESSION: 1. Large predominantly intermediate to high attenuation collection in the right breast likely to represent a large hematoma, with evidence of active extravasation within the collection, as detailed above. The possibility of active bleeding into a pre-existing breast mass is not excluded, but not strongly favored given the patient's history. 2. Patchy areas of ground-glass attenuation in the right lung, which may reflect mild alveolar hemorrhage from pulmonary contusion or acute inflammation in the setting of mild aspiration. No other signs of significant acute intrathoracic trauma. 3. Moderate-sized hiatal hernia. 4. Aortic atherosclerosis, in addition to left anterior descending coronary artery disease. Please note that although the presence of coronary artery calcium documents the presence of coronary artery disease, the severity of this disease and any potential stenosis cannot be assessed on this non-gated CT examination. Assessment for potential risk factor modification, dietary therapy or pharmacologic therapy may be warranted, if clinically indicated. 5.  Additional incidental findings, as above. Critical Value/emergent results were called by telephone at the time of interpretation on 06/19/2023 at 6:15 am to provider [facility], who verbally acknowledged these results. Aortic Atherosclerosis (ICD10-I70.0). Electronically Signed   By: Trudie Reed M.D.   On: 06/19/2023 06:15   DG Chest 2 View  Result Date: 06/19/2023 CLINICAL DATA:  Fall pain under the right breast EXAM: CHEST - 2 VIEW COMPARISON:  04/15/2023 FINDINGS: Low lung volumes. Mild heterogeneous airspace disease at the bases. Normal cardiac size. No pneumothorax. Lucency beneath the right diaphragm, probable high-riding bowel. Moderate severe compression deformity of mid to lower thoracic vertebra. This is unchanged. IMPRESSION: 1. Low lung volumes with mild heterogeneous airspace disease at the bases, atelectasis versus pneumonia. 2. Lucency beneath the right diaphragm, favored to represent air-filled high-riding bowel. Electronically Signed   By: Jasmine Pang M.D.   On: 06/19/2023 03:36    Anti-infectives: Anti-infectives (From admission, onward)    None        Assessment/Plan Fall Right chest wall/axillary/breast hematoma -Hgb 10.4 from 10.7, stable. Continue pressure/breast binder, ice/heat Left knee effusion - CT negative for fx. Pt reports this is a chronic issue and she has had it drained multiple times, planning follow up with orthopedist  HTN Asthma MS Alcohol abuse  ID - none FEN - reg diet VTE - SCDs, start LMWH 11/1 Foley - none  Dispo - Home meds reordered. OT eval pending. Likely d/c later today after therapies. I called and updated the patient's husband.  I reviewed last 24 h vitals and pain scores, last 48  h intake and output, last 24 h labs and trends, and last 24 h imaging results.    LOS: 0 days    Franne Forts, [facility] Surgery 06/20/2023, 9:33 AM Please see Amion for pager number during day hours 7:00am-4:30pm

## 2023-06-25 ENCOUNTER — Encounter: Payer: Self-pay | Admitting: Cardiology

## 2023-06-25 ENCOUNTER — Ambulatory Visit: Payer: Federal, State, Local not specified - PPO | Admitting: Sports Medicine

## 2023-06-25 ENCOUNTER — Ambulatory Visit: Payer: Federal, State, Local not specified - PPO | Attending: Cardiology | Admitting: Cardiology

## 2023-06-25 ENCOUNTER — Other Ambulatory Visit (INDEPENDENT_AMBULATORY_CARE_PROVIDER_SITE_OTHER): Payer: Federal, State, Local not specified - PPO

## 2023-06-25 VITALS — BP 120/70 | HR 85 | Ht 63.0 in | Wt 170.0 lb

## 2023-06-25 DIAGNOSIS — M7989 Other specified soft tissue disorders: Secondary | ICD-10-CM

## 2023-06-25 DIAGNOSIS — R0609 Other forms of dyspnea: Secondary | ICD-10-CM

## 2023-06-25 DIAGNOSIS — G35 Multiple sclerosis: Secondary | ICD-10-CM

## 2023-06-25 DIAGNOSIS — D649 Anemia, unspecified: Secondary | ICD-10-CM

## 2023-06-25 DIAGNOSIS — I10 Essential (primary) hypertension: Secondary | ICD-10-CM

## 2023-06-25 MED ORDER — FUROSEMIDE 40 MG PO TABS: 40.0000 mg | ORAL_TABLET | Freq: Every day | ORAL | 3 refills | Status: DC

## 2023-06-25 MED ORDER — POTASSIUM CHLORIDE CRYS ER 20 MEQ PO TBCR: 20.0000 meq | EXTENDED_RELEASE_TABLET | Freq: Every day | ORAL | 3 refills | Status: DC

## 2023-06-25 NOTE — Progress Notes (Signed)
Cardiology Consultation:    Date:  06/25/2023   ID:  Catherine Hubbard, DOB 11/29/1960, MRN [id number]  PCP:  [name], PA-C  Cardiologist:  Gypsy Balsam, MD   Referring MD: Marisue Brooklyn   Chief Complaint  Patient presents with   Leg Swelling    History of Present Illness:    Catherine Hubbard is a 62 y.o. female who is being seen today for the evaluation of swelling of the leg at the request of [place], Ramon Dred[place], [place].  Past medical history significant for multiple sclerosis, essential hypertension, some alcohol abuse, GERD.  She was referred to [place] because of swelling of lower extremities.  Did happen especially at evening time which is worse.  During the morning With her legs are fine.  She denies having any paroxysmal nocturnal dyspnea.  Her ability to exercise is somewhat limited because of left leg pain and some repeat problem with multiple sclerosis.  She never smoked never had any heart trouble.  She does have history of hypertension but no diabetes, her cholesterol is quite reasonable.  Multiple family members with heart issue however all of them seems to be smoking.  She does not exercise on the regular basis she is not on any special diet.  Past Medical History:  Diagnosis Date   Alcohol abuse    Asthma    Esophagitis    GERD (gastroesophageal reflux disease)    Headache    Hearing loss    Hypertension    Multiple sclerosis (HCC)    Pancreatitis    Vision abnormalities     Past Surgical History:  Procedure Laterality Date   ANKLE FRACTURE SURGERY Bilateral    CARPAL TUNNEL RELEASE Bilateral    CHOLECYSTECTOMY     COLONOSCOPY  11/2016   multiple   ESOPHAGOGASTRODUODENOSCOPY  11/2016   ESOPHAGOGASTRODUODENOSCOPY N/A 02/01/2017   Procedure: ESOPHAGOGASTRODUODENOSCOPY (EGD);  Surgeon: Iva Boop, MD;  Location: [facility] ENDOSCOPY;  Service: Endoscopy;  Laterality: N/A;   ESOPHAGOGASTRODUODENOSCOPY (EGD) WITH PROPOFOL N/A 02/04/2017    Procedure: ESOPHAGOGASTRODUODENOSCOPY (EGD) WITH PROPOFOL;  Surgeon: Meryl Dare, MD;  Location: [facility] ENDOSCOPY;  Service: Endoscopy;  Laterality: N/A;   LAPAROSCOPIC GASTRIC SLEEVE RESECTION  2011   ULNAR NERVE REPAIR Bilateral     Current Medications: Current Meds  Medication Sig   acetaminophen (TYLENOL) 500 MG tablet Take 2 tablets (1,000 mg total) by mouth every 6 (six) hours as needed for mild pain (pain score 1-3).   albuterol (VENTOLIN HFA) 108 (90 Base) MCG/ACT inhaler Inhale 2 puffs into the lungs every 6 (six) hours as needed for wheezing or shortness of breath.   amitriptyline (ELAVIL) 10 MG tablet TAKE 1 TABLET BY MOUTH EVERYDAY AT BEDTIME (Patient taking differently: Take 10 mg by mouth at bedtime as needed for sleep. TAKE 1 TABLET BY MOUTH EVERYDAY AT BEDTIME)   Ascorbic Acid (VITAMIN C PO) Take 1 tablet by mouth daily. 1 tablet daily   B Complex Vitamins (VITAMIN B COMPLEX PO) Take 1 tablet by mouth daily. 1 daily   baclofen (LIORESAL) 10 MG tablet Take 1 tablet (10 mg total) by mouth 3 (three) times daily as needed for muscle spasms.   bismuth subsalicylate (PEPTO BISMOL) 262 MG/15ML suspension Take 30 mLs by mouth daily as needed for indigestion or diarrhea or loose stools (acid reflux).   budesonide-formoterol (SYMBICORT) 80-4.5 MCG/ACT inhaler TAKE 2 PUFFS BY MOUTH TWICE A DAY (Patient taking differently: Inhale 2 puffs into the lungs 2 (two) times daily  as needed (sob).)   Calcium Carbonate Antacid (TUMS PO) Take 2 tablets by mouth 2 (two) times daily as needed (acid reflux).   Cholecalciferol (D2000 ULTRA STRENGTH) 50 MCG (2000 UT) CAPS Take 2,000 Units by mouth daily.   diclofenac Sodium (VOLTAREN) 1 % GEL Apply 4 g topically 4 (four) times daily. (Patient taking differently: Apply 4 g topically 4 (four) times daily as needed (joint pain).)   fluticasone (FLONASE) 50 MCG/ACT nasal spray SPRAY 2 SPRAYS INTO EACH NOSTRIL EVERY DAY (Patient taking differently: Place 2 sprays  into both nostrils once as needed for allergies.)   gabapentin (NEURONTIN) 100 MG capsule TAKE 1 CAPSULE BY MOUTH THREE TIMES A DAY (Patient taking differently: Take 100 mg by mouth 3 (three) times daily as needed (nerve pain).)   Iron, Ferrous Sulfate, 325 (65 Fe) MG TABS 1 tab po bid (Patient taking differently: Take 1 tablet by mouth in the morning and at bedtime. 1 tab po bid)   meloxicam (MOBIC) 7.5 MG tablet Take 1 tablet (7.5 mg total) by mouth daily. (Patient taking differently: Take 7.5 mg by mouth daily as needed for pain.)   modafinil (PROVIGIL) 200 MG tablet Take one po qAM and 1/2 po qPM (Patient taking differently: Take 250 mg by mouth daily. One and one-half tablets by mouth before noon.)   Multiple Vitamin (MULTIVITAMIN WITH MINERALS) TABS tablet Take 1 tablet by mouth daily.   nystatin cream (MYCOSTATIN) Apply 1 Application topically 2 (two) times daily. (Patient taking differently: Apply 1 Application topically 2 (two) times daily as needed for dry skin.)   ocrelizumab (OCREVUS) 300 MG/10ML injection INFUSE 600MG  INTRAVENOUSLY AS DIRECTED EVERY 6 MONTHS. (Patient taking differently: Inject 600 mg into the vein every 6 (six) months.)   olmesartan (BENICAR) 5 MG tablet 2 tab po q day (Patient taking differently: Take 10 mg by mouth daily. 2 tab po q day)   oxybutynin (DITROPAN) 5 MG tablet Take 1 tablet (5 mg total) by mouth 2 (two) times daily. (Patient taking differently: Take 5 mg by mouth once as needed for bladder spasms.)   oxyCODONE 10 MG TABS Take 0.5-1 tablets (5-10 mg total) by mouth every 6 (six) hours as needed for severe pain (pain score 7-10) or moderate pain (pain score 4-6).   Probiotic Product (PROBIOTIC DAILY PO) Take 1 tablet by mouth 3 (three) times a week. 1 daily   [DISCONTINUED] Cyanocobalamin 1000 MCG TBCR Take 1 tablet by mouth daily.   [DISCONTINUED] polyethylene glycol (MIRALAX / GLYCOLAX) 17 g packet Take 17 g by mouth daily as needed (constipation).    Current Facility-Administered Medications for the 06/25/23 encounter (Office Visit) with Georgeanna Lea, MD  Medication   sodium chloride (PF) 0.9 % injection 2 mL     Allergies:   Ibuprofen and Nsaids   Social History   Socioeconomic History   Marital status: Married    Spouse name: Not on file   Number of children: Not on file   Years of education: Not on file   Highest education level: Not on file  Occupational History   Not on file  Tobacco Use   Smoking status: Never   Smokeless tobacco: Never   Tobacco comments:    second hand smoker when young. Heavy exposure.  Vaping Use   Vaping status: Never Used  Substance and Sexual Activity   Alcohol use: Yes    Alcohol/week: 6.0 standard drinks of alcohol    Types: 6 Cans of beer per week  Comment: daily, couple of shots ( weekly depending on day)   Drug use: No   Sexual activity: Not Currently  Other Topics Concern   Not on file  Social History Narrative   Not on file   Social Determinants of Health   Financial Resource Strain: Not on file  Food Insecurity: No Food Insecurity (06/19/2023)   Hunger Vital Sign    Worried About Running Out of Food in the Last Year: Never true    Ran Out of Food in the Last Year: Never true  Transportation Needs: No Transportation Needs (06/19/2023)   PRAPARE - Administrator, Civil Service (Medical): No    Lack of Transportation (Non-Medical): No  Physical Activity: Not on file  Stress: Not on file  Social Connections: Not on file     Family History: The patient's family history includes Cancer in her father and mother; Stroke in her mother. ROS:   Please see the history of present illness.    All 14 point review of systems negative except as described per history of present illness.  EKGs/Labs/Other Studies Reviewed:    The following studies were reviewed today:   EKG:  EKG Interpretation Date/Time:  Tuesday June 25 2023 15:06:34 EST Ventricular  Rate:  82 PR Interval:  138 QRS Duration:  98 QT Interval:  394 QTC Calculation: 460 R Axis:   -39  Text Interpretation: Normal sinus rhythm Left axis deviation When compared with ECG of 19-Jun-2023 01:42, Left posterior fascicular block is no longer Present ST no longer elevated in Inferior leads Non-specific change in ST segment in Anterior leads Nonspecific T wave abnormality has replaced inverted T waves in Inferior leads Nonspecific T wave abnormality no longer evident in Lateral leads Confirmed by Gypsy Balsam [phone]) on 06/25/2023 3:14:53 PM    Recent Labs: 04/15/2023: ALT 11; Pro B Natriuretic peptide (BNP) 225.0 06/20/2023: BUN 10; Creatinine, Ser 0.74; Hemoglobin 10.4; Platelets 277; Potassium 3.4; Sodium 131  Recent Lipid Panel    Component Value Date/Time   CHOL 135 12/18/2021 1544   TRIG 67.0 12/18/2021 1544   HDL 71.70 12/18/2021 1544   CHOLHDL 2 12/18/2021 1544   VLDL 13.4 12/18/2021 1544   LDLCALC 50 12/18/2021 1544    Physical Exam:    VS:  BP 120/70 (BP Location: Left Arm, Patient Position: Sitting)   Pulse 85   Ht 5\' 3"  (1.6 m)   Wt 170 lb (77.1 kg)   LMP 08/13/2014 Comment: very few periods  SpO2 (!) 85%   BMI 30.11 kg/m     Wt Readings from Last 3 Encounters:  06/25/23 170 lb (77.1 kg)  06/19/23 170 lb 3.1 oz (77.2 kg)  06/14/23 170 lb 2 oz (77.2 kg)     GEN:  Well nourished, well developed in no acute distress HEENT: Normal NECK: No JVD; No carotid bruits LYMPHATICS: No lymphadenopathy CARDIAC: RRR, no murmurs, no rubs, no gallops RESPIRATORY:  Clear to auscultation without rales, wheezing or rhonchi  ABDOMEN: Soft, non-tender, non-distended MUSCULOSKELETAL: 2+ pitting edema both lower extremities SKIN: Warm and dry NEUROLOGIC:  Alert and oriented x 3 PSYCHIATRIC:  Normal affect   ASSESSMENT:    1. Essential hypertension   2. Leg swelling   3. Multiple sclerosis (HCC)    PLAN:    In order of problems listed above:  Swelling of  lower extremities.  I suspect multifactorial.  Will give her 40 mg Lasix with 20 mg potassium, will check Chem-7 next week.  I  asked her also to not to drink too much fluids.  Just drink when she is thirsty.  I will schedule her to have echocardiogram to assess left ventricle ejection fraction also will look for evidence of right ventricle pressure and function. Snoring at night I suspect she does have obstructive sleep apnea in the future we will consider doing sleep study. Multiple sclerosis did be managed by neurologist apparently stable and quiet lately. Dyslipidemia I did review K PN which show me LDL 50 HDL 71 we will continue monitoring   Medication Adjustments/Labs and Tests Ordered: Current medicines are reviewed at length with the patient today.  Concerns regarding medicines are outlined above.  Orders Placed This Encounter  Procedures   EKG 12-Lead   No orders of the defined types were placed in this encounter.   Signed, Georgeanna Lea, MD, [facility]. 06/25/2023 3:31 PM    [facility]

## 2023-06-25 NOTE — Patient Instructions (Signed)
Medication Instructions:   START: Lasix 40mg  1 tablet daily  START: Potassium 1 tablet daily   Lab Work: 3rd Floor   [address]  Your physician recommends that you return for lab work in:   1 week You need to have labs done when you are fasting.  You can come Monday through Friday 8:00 am to 11:30AM and 1:00 to 4:00. You do not need to make an appointment as the order has already been placed. The labs you are going to have done are  BMP, ProBNP   Testing/Procedures: Your physician has requested that you have an echocardiogram. Echocardiography is a painless test that uses sound waves to create images of your heart. It provides your doctor with information about the size and shape of your heart and how well your heart's chambers and valves are working. This procedure takes approximately one hour. There are no restrictions for this procedure. Please do NOT wear cologne, perfume, aftershave, or lotions (deodorant is allowed). Please arrive 15 minutes prior to your appointment time.  Please note: We ask at that you not bring children with you during ultrasound (echo/ vascular) testing. Due to room size and safety concerns, children are not allowed in the ultrasound rooms during exams. Our front office staff cannot provide observation of children in our lobby area while testing is being conducted. An adult accompanying a patient to their appointment will only be allowed in the ultrasound room at the discretion of the ultrasound technician under special circumstances. We apologize for any inconvenience.    Follow-Up: At [facility], you and your health needs are our priority.  As part of our continuing mission to provide you with exceptional heart care, we have created designated Provider Care Teams.  These Care Teams include your primary Cardiologist (physician) and Advanced Practice Providers (APPs -  Physician Assistants and Nurse Practitioners) who all work together to provide you with  the care you need, when you need it.  We recommend signing up for the patient portal called "MyChart".  Sign up information is provided on this After Visit Summary.  MyChart is used to connect with patients for Virtual Visits (Telemedicine).  Patients are able to view lab/test results, encounter notes, upcoming appointments, etc.  Non-urgent messages can be sent to your provider as well.   To learn more about what you can do with MyChart, go to [email or web address].    Your next appointment:   6 week(s)  The format for your next appointment:   In Person  Provider:   Gypsy Balsam, MD    Other Instructions NA

## 2023-06-25 NOTE — Addendum Note (Signed)
Addended by: Baldo Ash D on: 06/25/2023 03:47 PM   Modules accepted: Orders

## 2023-06-26 ENCOUNTER — Encounter: Payer: Federal, State, Local not specified - PPO | Admitting: Physical Therapy

## 2023-06-26 ENCOUNTER — Encounter: Payer: Self-pay | Admitting: Sports Medicine

## 2023-06-26 ENCOUNTER — Ambulatory Visit: Payer: Federal, State, Local not specified - PPO | Admitting: Sports Medicine

## 2023-06-26 VITALS — BP 130/84 | Ht 63.0 in | Wt 170.0 lb

## 2023-06-26 DIAGNOSIS — M25462 Effusion, left knee: Secondary | ICD-10-CM | POA: Diagnosis not present

## 2023-06-26 LAB — IRON,TIBC AND FERRITIN PANEL
%SAT: 41 % (ref 16–45)
Ferritin: 129 ng/mL (ref 16–288)
Iron: 119 ug/dL (ref 45–160)
TIBC: 287 ug/dL (ref 250–450)

## 2023-06-26 LAB — CBC WITH DIFFERENTIAL/PLATELET
Basophils Absolute: 0.1 10*3/uL (ref 0.0–0.1)
Basophils Relative: 1.2 % (ref 0.0–3.0)
Eosinophils Absolute: 0.3 10*3/uL (ref 0.0–0.7)
Eosinophils Relative: 4.6 % (ref 0.0–5.0)
HCT: 32 % — ABNORMAL LOW (ref 36.0–46.0)
Hemoglobin: 10.9 g/dL — ABNORMAL LOW (ref 12.0–15.0)
Lymphocytes Relative: 15.7 % (ref 12.0–46.0)
Lymphs Abs: 1 10*3/uL (ref 0.7–4.0)
MCHC: 34.1 g/dL (ref 30.0–36.0)
MCV: 102.1 fL — ABNORMAL HIGH (ref 78.0–100.0)
Monocytes Absolute: 1 10*3/uL (ref 0.1–1.0)
Monocytes Relative: 15.9 % — ABNORMAL HIGH (ref 3.0–12.0)
Neutro Abs: 3.9 10*3/uL (ref 1.4–7.7)
Neutrophils Relative %: 62.6 % (ref 43.0–77.0)
Platelets: 409 10*3/uL — ABNORMAL HIGH (ref 150.0–400.0)
RBC: 3.13 Mil/uL — ABNORMAL LOW (ref 3.87–5.11)
RDW: 13.4 % (ref 11.5–15.5)
WBC: 6.2 10*3/uL (ref 4.0–10.5)

## 2023-06-26 MED ORDER — TRAMADOL HCL 50 MG PO TABS: 50.0000 mg | ORAL_TABLET | Freq: Two times a day (BID) | ORAL | 0 refills | Status: DC | PRN

## 2023-06-26 NOTE — Progress Notes (Addendum)
   Subjective:    Patient ID: Catherine Hubbard, female    DOB: [birth year], 62 y.o.   MRN: [id number]  HPI  Patient present today with persistent left knee pain and swelling.  She has had the knee aspirated and injected on multiple occasions.  Each procedure does provide temporary pain relief but her swelling soon returns.  Previous x-rays showed only moderate degenerative changes.  A recent CT scan of her knee did show an effusion but no acute fracture.  She did recently suffer a fall which exacerbated her symptoms.  She uses an Ace wrap for compression.   Review of Systems As above    Objective:   Physical Exam  Well-developed, well-nourished.  Left knee: Limited range of motion.  1-2+ effusion.  Knee is grossly stable to ligamentous exam but there is tenderness to palpation along both medial and lateral joint lines.  Positive McMurray's.      Assessment & Plan:   Left knee pain likely secondary to meniscal tear  MRI of the left knee to rule out a meniscal tear.  I will follow-up with her via telephone when results are available.  I did ask her to contact me once the MRI is complete so that I can expedite the radiologist's reading.  In the meantime, she will continue to use her Ace wrap for compression and I will trial tramadol 50 mg twice daily as needed for pain for the next 5 days.  This note was dictated using Dragon naturally speaking software and may contain errors in syntax, spelling, or content which have not been identified prior to signing this note.

## 2023-07-03 ENCOUNTER — Ambulatory Visit (HOSPITAL_BASED_OUTPATIENT_CLINIC_OR_DEPARTMENT_OTHER): Admission: RE | Admit: 2023-07-03 | Payer: Federal, State, Local not specified - PPO | Source: Ambulatory Visit

## 2023-07-04 ENCOUNTER — Ambulatory Visit: Payer: Federal, State, Local not specified - PPO | Admitting: Medical

## 2023-07-04 ENCOUNTER — Ambulatory Visit (HOSPITAL_BASED_OUTPATIENT_CLINIC_OR_DEPARTMENT_OTHER)
Admission: RE | Admit: 2023-07-04 | Discharge: 2023-07-04 | Disposition: A | Discharge status: Home / Self Care | Payer: Federal, State, Local not specified - PPO | Source: Ambulatory Visit | Attending: Medical | Admitting: Medical

## 2023-07-04 VITALS — BP 122/82 | HR 92 | Resp 18 | Ht 63.0 in | Wt 160.8 lb

## 2023-07-04 DIAGNOSIS — R252 Cramp and spasm: Secondary | ICD-10-CM | POA: Diagnosis not present

## 2023-07-04 DIAGNOSIS — I771 Stricture of artery: Secondary | ICD-10-CM | POA: Diagnosis not present

## 2023-07-04 DIAGNOSIS — I10 Essential (primary) hypertension: Secondary | ICD-10-CM | POA: Diagnosis not present

## 2023-07-04 DIAGNOSIS — R0781 Pleurodynia: Secondary | ICD-10-CM | POA: Insufficient documentation

## 2023-07-04 DIAGNOSIS — S20211D Contusion of right front wall of thorax, subsequent encounter: Secondary | ICD-10-CM | POA: Diagnosis not present

## 2023-07-04 DIAGNOSIS — K449 Diaphragmatic hernia without obstruction or gangrene: Secondary | ICD-10-CM | POA: Diagnosis not present

## 2023-07-04 DIAGNOSIS — R0609 Other forms of dyspnea: Secondary | ICD-10-CM | POA: Diagnosis not present

## 2023-07-04 LAB — CBC WITH DIFFERENTIAL/PLATELET
Basophils Absolute: 0.1 10*3/uL (ref 0.0–0.1)
Basophils Relative: 1.7 % (ref 0.0–3.0)
Eosinophils Absolute: 0.3 10*3/uL (ref 0.0–0.7)
Eosinophils Relative: 3.8 % (ref 0.0–5.0)
HCT: 38.3 % (ref 36.0–46.0)
Hemoglobin: 12.8 g/dL (ref 12.0–15.0)
Lymphocytes Relative: 19.4 % (ref 12.0–46.0)
Lymphs Abs: 1.5 10*3/uL (ref 0.7–4.0)
MCHC: 33.4 g/dL (ref 30.0–36.0)
MCV: 103.1 fL — ABNORMAL HIGH (ref 78.0–100.0)
Monocytes Absolute: 1.1 10*3/uL — ABNORMAL HIGH (ref 0.1–1.0)
Monocytes Relative: 14.3 % — ABNORMAL HIGH (ref 3.0–12.0)
Neutro Abs: 4.8 10*3/uL (ref 1.4–7.7)
Neutrophils Relative %: 60.8 % (ref 43.0–77.0)
Platelets: 421 10*3/uL — ABNORMAL HIGH (ref 150.0–400.0)
RBC: 3.72 Mil/uL — ABNORMAL LOW (ref 3.87–5.11)
RDW: 13.3 % (ref 11.5–15.5)
WBC: 7.9 10*3/uL (ref 4.0–10.5)

## 2023-07-04 LAB — MAGNESIUM: Magnesium: 1.7 mg/dL (ref 1.5–2.5)

## 2023-07-04 NOTE — Patient Instructions (Addendum)
1. Pain in rib Rt side significant pain on palpation so will get xray to evaluate if fracture seen - DG Ribs Unilateral W/Chest Right; Future  2. Hematoma of right chest wall, subsequent encounter -large bruising rt axillary and rt lower breast area but much better compared to the picture you showed me day of hospital visit. Based on size may take 1-2 months for bruise to absorb.  - CBC w/Diff  3. Essential hypertension -controlled. Continue olmesartan and lasix. Bnp and bmp labs pending. Cardiologist ordered.  4. Cramp in limb -describe in intermittent calf cramp. - Magnesium   Sinus pressure/concern for sinus infection -azithromycin 5 days antibiotic and flonase nasal spray.  Follow up date to be determined after lab and imaging review.

## 2023-07-04 NOTE — Progress Notes (Signed)
Subjective:    Patient ID: Catherine Hubbard, female    DOB: April 03, 1961, 62 y.o.   MRN: [id number]  HPI Pt in for follow up post hospitalization.  Below dc summary.  Admit date: 06/19/2023 Discharge date: 06/20/2023     Discharge Diagnosis Fall Right chest wall/axillary/breast hematoma Left knee effusion HTN Asthma MS Alcohol abuse  HPI: Catherine Hubbard is a 62 year old woman with history of alcohol abuse, MS, pancreatitis, osteoarthritis, chronic knee pain, esophagitis, depression and anxiety who presented to the emergency department after a ground-level fall.  She was walking to the bathroom without her walker which she is supposed to use, and was using furniture along the wall to brace herself.  Her husband was walking behind her and caught her as she was falling and was able to lower her to the ground without hitting anything, but she did sustain a large bruise and hematoma to the right axilla where her husband had caught her.  She reports isolated pain in this area.  Denies loss of consciousness, no chest pain or shortness of breath, no abdominal pain or nausea.  Underwent a CT scan that demonstrates a large right axillary/breast hematoma with extravasation and trauma is consulted for further care.    Hospital Course: Patient was admitted to the trauma service for observation and pain control. H/h monitored and stabilized without the need for blood transfusion. Left knee effusion noted and evaluated with CT scan which was negative for acute injury; per patient this is chronic and she has it intermittently drained with orthopedics. Patient worked with therapies during this admission. On 10/31 the patient was felt stable for discharge home with her husband.  Patient will follow up as below and knows to call with questions or concerns.   Expand All Collapse All    [facility] Discharge Summary    Patient ID: Catherine Hubbard MRN:  [id number] DOB/AGE: [birth year] 62 y.o.   Admit date: 06/19/2023 Discharge date: 06/20/2023     Discharge Diagnosis Fall Right chest wall/axillary/breast hematoma Left knee effusion HTN Asthma MS Alcohol abuse   Consultants None   Imaging:  Imaging Results (Last 48 hours)  CT Knee Left Wo Contrast   Result Date: 06/19/2023 CLINICAL DATA:  Knee trauma.  Occult fracture suspected. EXAM: CT OF THE LEFT KNEE WITHOUT CONTRAST TECHNIQUE: Multidetector CT imaging of the left knee was performed according to the standard protocol. Multiplanar CT image reconstructions were also generated. RADIATION DOSE REDUCTION: This exam was performed according to the departmental dose-optimization program which includes automated exposure control, adjustment of the mA and/or kV according to patient size and/or use of iterative reconstruction technique. COMPARISON:  Left knee radiographs 06/19/2023, 06/13/2023; 06/12/2023, 03/13/2023 FINDINGS: Bones/Joint/Cartilage There is diffuse decreased bone mineralization. Moderate medial coronoid joint space narrowing. Mild peripheral medial compartment and mild-to-moderate peripheral lateral compartment degenerative osteophytes. There is a 4 mm ossicle at the posterior aspect of the medial compartment, likely a small loose body (axial image 115 and sagittal image 70). Mild patellofemoral joint space narrowing and peripheral predominantly superior and lateral patellar degenerative osteophytes. No acute fracture is seen. Ligaments Suboptimally assessed by CT. Muscles and Tendons The quadriceps and patellar tendons appear intact. Normal size and density of the regional musculature. Soft tissues There is a moderate joint effusion with internal density of 18 Hounsfield units, which may be due to proteinaceous or hemosiderin contents. However, no layering nondependent fat level is seen to specifically indicate secondary signs of an acute fracture. Minimal synovial  thickening. Mild  prepatellar subcutaneous fat density may be secondary to scarring or a pressure lesion. No walled-off fluid collection is seen. IMPRESSION: 1. Within the limitations of diffuse decreased bone mineralization, no acute fracture is seen. 2. Moderate joint effusion with mildly increased internal density of 18 Hounsfield units, which may be due to proteinaceous or hemosiderin contents. However, no layering nondependent fat level is seen to specifically indicate secondary signs of an acute fracture. 3. 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 4. There is a 4 mm ossicle at the posterior aspect of the medial compartment, likely a small loose body. Electronically Signed   By: Neita Garnet M.D.   On: 06/19/2023 14:44    DG Knee Complete 4 Views Left   Result Date: 06/19/2023 CLINICAL DATA:  Fall with bruising to left knee EXAM: LEFT KNEE - COMPLETE 4 VIEW COMPARISON:  Six days prior FINDINGS: Hyperextended appearance of the knee. There is a subtle lucency through the medial supracondylar femur seen on at least two views. Subjective osteopenia. Large knee joint effusion. IMPRESSION: 1. Possible supracondylar femur fracture, recommend CT without contrast. 2. Knee joint effusion. Electronically Signed   By: Tiburcio Pea M.D.   On: 06/19/2023 06:40    DG Shoulder Right   Result Date: 06/19/2023 CLINICAL DATA:  Fall with right axillary bruising. EXAM: RIGHT SHOULDER - 3 VIEW COMPARISON:  Chest CT performed at the same time. FINDINGS: Remote right femoral neck fracture with healed posterior impaction. No indication of acute fracture or dislocation. Degenerative spurring at the glenohumeral and acromioclavicular joints. IMPRESSION: 1. No acute finding. 2. Remote fracture of the right humeral neck. Electronically Signed   By: Tiburcio Pea M.D.   On: 06/19/2023 06:37    CT Chest W Contrast   Result Date: 06/19/2023 CLINICAL DATA:  62 year old female with history of  blunt trauma to the right chest wall from a fall. Hematoma. EXAM: CT CHEST WITH CONTRAST TECHNIQUE: Multidetector CT imaging of the chest was performed during intravenous contrast administration. RADIATION DOSE REDUCTION: This exam was performed according to the departmental dose-optimization program which includes automated exposure control, adjustment of the mA and/or kV according to patient size and/or use of iterative reconstruction technique. CONTRAST:  50mL OMNIPAQUE IOHEXOL 350 MG/ML SOLN COMPARISON:  Chest CT 04/14/2019. FINDINGS: Cardiovascular: No abnormal high attenuation fluid within the mediastinum to suggest posttraumatic mediastinal hematoma. No evidence of posttraumatic aortic dissection/transection. Heart size is normal. There is no significant pericardial fluid, thickening or pericardial calcification. There is aortic atherosclerosis, as well as atherosclerosis of the great vessels of the mediastinum and the coronary arteries, including calcified atherosclerotic plaque in the left anterior descending coronary artery. Mediastinum/Nodes: No pathologically enlarged mediastinal or hilar lymph nodes. Moderate-sized hiatal hernia. No axillary lymphadenopathy. Lungs/Pleura: No pneumothorax. Some patchy ground-glass attenuation is noted throughout the right lung, potentially areas of alveolar hemorrhage from mild pulmonary contusion or inflammation as sequela of aspiration. No confluent consolidative airspace disease. No definite suspicious appearing pulmonary nodules or masses are noted. No pleural effusions. Upper Abdomen: Postoperative changes of sleeve gastrectomy. Status post cholecystectomy. Musculoskeletal: Extensive soft tissue stranding throughout the right breast. In the lateral aspect of the right breast there is a large collection of material which is heterogeneous in attenuation but generally intermediate to high attenuation, likely a hematoma estimated to measure approximately 6.5 x 7.8 x 4.8  cm (estimated volume of 122 mL). Within this collection there is a serpiginous focus of high attenuation within ill-defined inferior margin, likely to  reflect active extravasation. Chronic appearing compression fracture of T8 with proximally 70% loss of anterior vertebral body height. Old healed fracture of the inferior aspect of the manubrium with mild posttraumatic deformity. Multiple old healed bilateral rib fractures. No definite acute displaced fractures or aggressive appearing lytic or blastic lesions are noted in the visualized portions of the skeleton. IMPRESSION: 1. Large predominantly intermediate to high attenuation collection in the right breast likely to represent a large hematoma, with evidence of active extravasation within the collection, as detailed above. The possibility of active bleeding into a pre-existing breast mass is not excluded, but not strongly favored given the patient's history. 2. Patchy areas of ground-glass attenuation in the right lung, which may reflect mild alveolar hemorrhage from pulmonary contusion or acute inflammation in the setting of mild aspiration. No other signs of significant acute intrathoracic trauma. 3. Moderate-sized hiatal hernia. 4. Aortic atherosclerosis, in addition to left anterior descending coronary artery disease. Please note that although the presence of coronary artery calcium documents the presence of coronary artery disease, the severity of this disease and any potential stenosis cannot be assessed on this non-gated CT examination. Assessment for potential risk factor modification, dietary therapy or pharmacologic therapy may be warranted, if clinically indicated. 5. Additional incidental findings, as above. Critical Value/emergent results were called by telephone at the time of interpretation on 06/19/2023 at 6:15 am to provider [facility], who verbally acknowledged these results. Aortic Atherosclerosis (ICD10-I70.0). Electronically Signed   By:  Trudie Reed M.D.   On: 06/19/2023 06:15    DG Chest 2 View   Result Date: 06/19/2023 CLINICAL DATA:  Fall pain under the right breast EXAM: CHEST - 2 VIEW COMPARISON:  04/15/2023 FINDINGS: Low lung volumes. Mild heterogeneous airspace disease at the bases. Normal cardiac size. No pneumothorax. Lucency beneath the right diaphragm, probable high-riding bowel. Moderate severe compression deformity of mid to lower thoracic vertebra. This is unchanged. IMPRESSION: 1. Low lung volumes with mild heterogeneous airspace disease at the bases, atelectasis versus pneumonia. 2. Lucency beneath the right diaphragm, favored to represent air-filled high-riding bowel. Electronically Signed   By: Jasmine Pang M.D.   On: 06/19/2023 03:36       Procedures None   HPI: Catherine Hubbard is a 62 year old woman with history of alcohol abuse, MS, pancreatitis, osteoarthritis, chronic knee pain, esophagitis, depression and anxiety who presented to the emergency department after a ground-level fall.  She was walking to the bathroom without her walker which she is supposed to use, and was using furniture along the wall to brace herself.  Her husband was walking behind her and caught her as she was falling and was able to lower her to the ground without hitting anything, but she did sustain a large bruise and hematoma to the right axilla where her husband had caught her.  She reports isolated pain in this area.  Denies loss of consciousness, no chest pain or shortness of breath, no abdominal pain or nausea.  Underwent a CT scan that demonstrates a large right axillary/breast hematoma with extravasation and trauma is consulted for further care.      Hospital Course: Patient was admitted to the trauma service for observation and pain control. H/h monitored and stabilized without the need for blood transfusion. Left knee effusion noted and evaluated with CT scan which was negative for acute injury; per patient this is  chronic and she has it intermittently drained with orthopedics. Patient worked with therapies during this admission. On 10/31 the patient  was felt stable for discharge home with her husband.  Patient will follow up as below and knows to call with questions or concerns.     I have personally reviewed the patients medication history on the [place] controlled substance database.        Allergies as of 06/20/2023         Reactions    Ibuprofen Other (See Comments)    Cannot take due to gastric bypass    Nsaids Other (See Comments)    Can not take due to Gastric bypass            Medication List       TAKE these medications     acetaminophen 500 MG tablet Commonly known as: TYLENOL Take 2 tablets (1,000 mg total) by mouth every 6 (six) hours as needed for mild pain (pain score 1-3).    amitriptyline 10 MG tablet Commonly known as: ELAVIL TAKE 1 TABLET BY MOUTH EVERYDAY AT BEDTIME What changed: See the new instructions.    baclofen 10 MG tablet Commonly known as: LIORESAL Take 1 tablet (10 mg total) by mouth 3 (three) times daily as needed for muscle spasms.    bismuth subsalicylate 262 MG/15ML suspension Commonly known as: PEPTO BISMOL Take 30 mLs by mouth daily as needed (acid reflux).    budesonide-formoterol 80-4.5 MCG/ACT inhaler Commonly known as: SYMBICORT TAKE 2 PUFFS BY MOUTH TWICE A DAY What changed: See the new instructions.    Cyanocobalamin 1000 MCG Tbcr Take 1 tablet by mouth daily.    D2000 Ultra Strength 50 MCG (2000 UT) Caps Generic drug: Cholecalciferol Take 2,000 Units by mouth daily.    diclofenac Sodium 1 % Gel Commonly known as: VOLTAREN Apply 4 g topically 4 (four) times daily.    fluticasone 50 MCG/ACT nasal spray Commonly known as: FLONASE SPRAY 2 SPRAYS INTO EACH NOSTRIL EVERY DAY What changed: See the new instructions.    gabapentin 100 MG capsule Commonly known as: NEURONTIN TAKE 1 CAPSULE BY MOUTH THREE TIMES A DAY What changed: See  the new instructions.    Iron (Ferrous Sulfate) 325 (65 Fe) MG Tabs 1 tab po bid    meloxicam 7.5 MG tablet Commonly known as: Mobic Take 1 tablet (7.5 mg total) by mouth daily.    modafinil 200 MG tablet Commonly known as: PROVIGIL Take one po qAM and 1/2 po qPM What changed:  how much to take how to take this when to take this additional instructions    multivitamin with minerals Tabs tablet Take 1 tablet by mouth daily.    nystatin cream Commonly known as: MYCOSTATIN Apply 1 Application topically 2 (two) times daily.    Ocrevus 300 MG/10ML injection Generic drug: ocrelizumab INFUSE 600MG  INTRAVENOUSLY AS DIRECTED EVERY 6 MONTHS.    olmesartan 5 MG tablet Commonly known as: BENICAR 2 tab po q day What changed:  how much to take how to take this when to take this    oxybutynin 5 MG tablet Commonly known as: DITROPAN Take 1 tablet (5 mg total) by mouth 2 (two) times daily. What changed:  when to take this reasons to take this    Oxycodone HCl 10 MG Tabs Take 0.5-1 tablets (5-10 mg total) by mouth every 6 (six) hours as needed for severe pain (pain score 7-10) or moderate pain (pain score 4-6).    polyethylene glycol 17 g packet Commonly known as: MIRALAX / GLYCOLAX Take 17 g by mouth daily as needed (constipation).  PROBIOTIC DAILY PO Take by mouth. 1 daily    TUMS PO Take 2 tablets by mouth 2 (two) times daily as needed (acid reflux).    Ventolin HFA 108 (90 Base) MCG/ACT inhaler Generic drug: albuterol Inhale into the lungs.    VITAMIN B COMPLEX PO Take by mouth. 1 daily    VITAMIN C PO Take by mouth. 1 tablet daily                 Follow-up Information       Cortavious Nix, Kateri Mc. Schedule an appointment as soon as possible for a visit in 1 week(s).   Specialties: Internal Medicine, Family Medicine Why: Call for post-hospitalization follow up appointment with your PCP regarding right breast/ chest wall hematoma Contact  information: [address] [place] [address] [phone]         Pt shows me pictures of rt axillary and breast area day of the fall. Hematoma of rt axillary was much worse and entire rt breast was bruised. She has rt rib pain on palpation. Bruised area less tender.  Left knee pain- much improved after fluid drawn off while hospitalized. Dr. Margaretha Sheffield had ordered mri.   Pt tells me she did not fall.    Pt reports recent sinus pressure and some ear pain for past 4 days.  Htn- pt bp controlled.  She just saw cardiologist who order bmp and bnp. So I will order just cbc today.   Review of Systems  Constitutional:  Negative for chills and fatigue.  HENT:  Negative for congestion and ear pain.   Respiratory:  Negative for cough, choking and wheezing.   Cardiovascular:  Negative for chest pain and palpitations.  Gastrointestinal:  Negative for abdominal pain.  Musculoskeletal:  Negative for back pain.  Hematological:  Bruises/bleeds easily.       Large bruise exceeds described mechanism.  Psychiatric/Behavioral:  Negative for behavioral problems and confusion.     Past Medical History:  Diagnosis Date   Alcohol abuse    Asthma    Esophagitis    GERD (gastroesophageal reflux disease)    Headache    Hearing loss    Hypertension    Multiple sclerosis (HCC)    Pancreatitis    Vision abnormalities      Social History   Socioeconomic History   Marital status: Married    Spouse name: Not on file   Number of children: Not on file   Years of education: Not on file   Highest education level: Not on file  Occupational History   Not on file  Tobacco Use   Smoking status: Never   Smokeless tobacco: Never   Tobacco comments:    second hand smoker when young. Heavy exposure.  Vaping Use   Vaping status: Never Used  Substance and Sexual Activity   Alcohol use: Yes    Alcohol/week: 6.0 standard drinks of alcohol    Types: 6 Cans of beer per week     Comment: daily, couple of shots ( weekly depending on day)   Drug use: No   Sexual activity: Not Currently  Other Topics Concern   Not on file  Social History Narrative   Not on file   Social Determinants of Health   Financial Resource Strain: Not on file  Food Insecurity: No Food Insecurity (06/19/2023)   Hunger Vital Sign    Worried About Running Out of Food in the Last Year: Never true  Ran Out of Food in the Last Year: Never true  Transportation Needs: No Transportation Needs (06/19/2023)   PRAPARE - Administrator, Civil Service (Medical): No    Lack of Transportation (Non-Medical): No  Physical Activity: Not on file  Stress: Not on file  Social Connections: Not on file  Intimate Partner Violence: Not At Risk (06/19/2023)   Humiliation, Afraid, Rape, and Kick questionnaire    Fear of Current or Ex-Partner: No    Emotionally Abused: No    Physically Abused: No    Sexually Abused: No    Past Surgical History:  Procedure Laterality Date   ANKLE FRACTURE SURGERY Bilateral    CARPAL TUNNEL RELEASE Bilateral    CHOLECYSTECTOMY     COLONOSCOPY  11/2016   multiple   ESOPHAGOGASTRODUODENOSCOPY  11/2016   ESOPHAGOGASTRODUODENOSCOPY N/A 02/01/2017   Procedure: ESOPHAGOGASTRODUODENOSCOPY (EGD);  Surgeon: Iva Boop, MD;  [facility] ENDOSCOPY;  Service: Endoscopy;  Laterality: N/A;   ESOPHAGOGASTRODUODENOSCOPY (EGD) WITH PROPOFOL N/A 02/04/2017   Procedure: ESOPHAGOGASTRODUODENOSCOPY (EGD) WITH PROPOFOL;  Surgeon: Meryl Dare, MD;  Location: [facility] ENDOSCOPY;  Service: Endoscopy;  Laterality: N/A;   LAPAROSCOPIC GASTRIC SLEEVE RESECTION  2011   ULNAR NERVE REPAIR Bilateral     Family History  Problem Relation Age of Onset   Cancer Mother    Stroke Mother    Cancer Father     Allergies  Allergen Reactions   Ibuprofen Other (See Comments)    Cannot take due to gastric bypass   Nsaids Other (See Comments)    Can not take due to Gastric bypass     Current Outpatient Medications on File Prior to Visit  Medication Sig Dispense Refill   acetaminophen (TYLENOL) 500 MG tablet Take 2 tablets (1,000 mg total) by mouth every 6 (six) hours as needed for mild pain (pain score 1-3).     albuterol (VENTOLIN HFA) 108 (90 Base) MCG/ACT inhaler Inhale 2 puffs into the lungs every 6 (six) hours as needed for wheezing or shortness of breath.     amitriptyline (ELAVIL) 10 MG tablet TAKE 1 TABLET BY MOUTH EVERYDAY AT BEDTIME (Patient taking differently: Take 10 mg by mouth at bedtime as needed for sleep. TAKE 1 TABLET BY MOUTH EVERYDAY AT BEDTIME) 90 tablet 1   Ascorbic Acid (VITAMIN C PO) Take 1 tablet by mouth daily. 1 tablet daily     B Complex Vitamins (VITAMIN B COMPLEX PO) Take 1 tablet by mouth daily. 1 daily     baclofen (LIORESAL) 10 MG tablet Take 1 tablet (10 mg total) by mouth 3 (three) times daily as needed for muscle spasms. 90 tablet 11   bismuth subsalicylate (PEPTO BISMOL) 262 MG/15ML suspension Take 30 mLs by mouth daily as needed for indigestion or diarrhea or loose stools (acid reflux).     budesonide-formoterol (SYMBICORT) 80-4.5 MCG/ACT inhaler TAKE 2 PUFFS BY MOUTH TWICE A DAY (Patient taking differently: Inhale 2 puffs into the lungs 2 (two) times daily as needed (sob).) 10.2 each 3   Calcium Carbonate Antacid (TUMS PO) Take 2 tablets by mouth 2 (two) times daily as needed (acid reflux).     Cholecalciferol (D2000 ULTRA STRENGTH) 50 MCG (2000 UT) CAPS Take 2,000 Units by mouth daily.     diclofenac Sodium (VOLTAREN) 1 % GEL Apply 4 g topically 4 (four) times daily. (Patient taking differently: Apply 4 g topically 4 (four) times daily as needed (joint pain).) 400 g 1   fluticasone (FLONASE) 50 MCG/ACT nasal  spray SPRAY 2 SPRAYS INTO EACH NOSTRIL EVERY DAY (Patient taking differently: Place 2 sprays into both nostrils once as needed for allergies.) 48 mL 3   furosemide (LASIX) 40 MG tablet Take 1 tablet (40 mg total) by mouth daily. 90  tablet 3   gabapentin (NEURONTIN) 100 MG capsule TAKE 1 CAPSULE BY MOUTH THREE TIMES A DAY (Patient taking differently: Take 100 mg by mouth 3 (three) times daily as needed (nerve pain).) 90 capsule 1   Iron, Ferrous Sulfate, 325 (65 Fe) MG TABS 1 tab po bid (Patient taking differently: Take 1 tablet by mouth in the morning and at bedtime. 1 tab po bid) 60 tablet 3   meloxicam (MOBIC) 7.5 MG tablet Take 1 tablet (7.5 mg total) by mouth daily. (Patient taking differently: Take 7.5 mg by mouth daily as needed for pain.) 30 tablet 2   modafinil (PROVIGIL) 200 MG tablet Take one po qAM and 1/2 po qPM (Patient taking differently: Take 250 mg by mouth daily. One and one-half tablets by mouth before noon.) 45 tablet 5   Multiple Vitamin (MULTIVITAMIN WITH MINERALS) TABS tablet Take 1 tablet by mouth daily.     nystatin cream (MYCOSTATIN) Apply 1 Application topically 2 (two) times daily. (Patient taking differently: Apply 1 Application topically 2 (two) times daily as needed for dry skin.) 30 g 1   ocrelizumab (OCREVUS) 300 MG/10ML injection INFUSE 600MG  INTRAVENOUSLY AS DIRECTED EVERY 6 MONTHS. (Patient taking differently: Inject 600 mg into the vein every 6 (six) months.) 20 mL 1   olmesartan (BENICAR) 5 MG tablet 2 tab po q day (Patient taking differently: Take 10 mg by mouth daily. 2 tab po q day) 180 tablet 3   oxybutynin (DITROPAN) 5 MG tablet Take 1 tablet (5 mg total) by mouth 2 (two) times daily. (Patient taking differently: Take 5 mg by mouth once as needed for bladder spasms.) 60 tablet 11   oxyCODONE 10 MG TABS Take 0.5-1 tablets (5-10 mg total) by mouth every 6 (six) hours as needed for severe pain (pain score 7-10) or moderate pain (pain score 4-6). 15 tablet 0   potassium chloride SA (KLOR-CON M) 20 MEQ tablet Take 1 tablet (20 mEq total) by mouth daily. 90 tablet 3   Probiotic Product (PROBIOTIC DAILY PO) Take 1 tablet by mouth 3 (three) times a week. 1 daily     traMADol (ULTRAM) 50 MG tablet  Take 1 tablet (50 mg total) by mouth every 12 (twelve) hours as needed for severe pain (pain score 7-10). 10 tablet 0   Current Facility-Administered Medications on File Prior to Visit  Medication Dose Route Frequency Provider Last Rate Last Admin   sodium chloride (PF) 0.9 % injection 2 mL  2 mL Intravenous PRN         BP 122/82   Pulse 92   Resp 18   Ht 5\' 3"  (1.6 m)   Wt 160 lb 12.8 oz (72.9 kg)   LMP 08/13/2014 Comment: very few periods  SpO2 97%   BMI 28.48 kg/m        Objective:   Physical Exam  General Mental Status- Alert. General Appearance- Not in acute distress.   Skin General: Color- Normal Color. Moisture- Normal Moisture.  Neck Carotid Arteries- Normal color. Moisture- Normal Moisture. No carotid bruits. No JVD.  Chest and Lung Exam Auscultation: Breath Sounds:-Normal.  Cardiovascular Auscultation:Rythm- Regular. Murmurs & Other Heart Sounds:Auscultation of the heart reveals- No Murmurs.  Abdomen Inspection:-Inspeection Normal. Palpation/Percussion:Note:No mass. Palpation and  Percussion of the abdomen reveal- Non Tender, Non Distended + BS, no rebound or guarding.   Neurologic Cranial Nerve exam:- CN III-XII intact(No nystagmus), symmetric smile. Strength:- 5/5 equal and symmetric strength both upper and lower extremities.       Assessment & Plan:   Patient Instructions  1. Pain in rib Rt side significant pain on palpation so will get xray to evaluate if fracture seen - DG Ribs Unilateral W/Chest Right; Future  2. Hematoma of right chest wall, subsequent encounter -large bruising rt axillary and rt lower breast area but much better compared to the picture you showed me day of hospital visit. Based on size may take 1-2 months for bruise to absorb.  - CBC w/Diff  3. Essential hypertension -controlled. Continue olmesartan and lasix. Bnp and bmp labs pending. Cardiologist ordered.  4. Cramp in limb -describe in intermittent calf cramp. -  Magnesium   Follow up date to be determined after lab and imaging review.   Catherine Richters, PA-C

## 2023-07-05 LAB — BASIC METABOLIC PANEL
BUN/Creatinine Ratio: 23 (ref 12–28)
BUN: 31 mg/dL — ABNORMAL HIGH (ref 8–27)
CO2: 25 mmol/L (ref 20–29)
Calcium: 11 mg/dL — ABNORMAL HIGH (ref 8.7–10.3)
Chloride: 93 mmol/L — ABNORMAL LOW (ref 96–106)
Creatinine, Ser: 1.36 mg/dL — ABNORMAL HIGH (ref 0.57–1.00)
Glucose: 88 mg/dL (ref 70–99)
Potassium: 5.4 mmol/L — ABNORMAL HIGH (ref 3.5–5.2)
Sodium: 135 mmol/L (ref 134–144)
eGFR: 44 mL/min/{1.73_m2} — ABNORMAL LOW (ref >59)

## 2023-07-05 LAB — PRO B NATRIURETIC PEPTIDE: NT-Pro BNP: 448 pg/mL — ABNORMAL HIGH (ref 0–287)

## 2023-07-05 MED ORDER — AZITHROMYCIN 250 MG PO TABS
ORAL_TABLET | ORAL | 0 refills | Status: AC
Start: 2023-07-05 — End: 2023-07-10

## 2023-07-05 MED ORDER — FLUTICASONE PROPIONATE 50 MCG/ACT NA SUSP
2.0000 | Freq: Every day | NASAL | 1 refills | Status: DC
Start: 2023-07-05 — End: 2024-01-22

## 2023-07-05 MED ORDER — BENZONATATE 100 MG PO CAPS: 100.0000 mg | ORAL_CAPSULE | Freq: Three times a day (TID) | ORAL | 0 refills | Status: DC | PRN

## 2023-07-05 MED ORDER — TRAMADOL HCL 50 MG PO TABS: ORAL_TABLET | ORAL | 0 refills | Status: DC

## 2023-07-05 NOTE — Addendum Note (Signed)
Addended by: Gwenevere Abbot on: 07/05/2023 04:40 PM   Modules accepted: Orders, Level of Service

## 2023-07-09 ENCOUNTER — Encounter
Payer: Federal, State, Local not specified - PPO | Attending: Physical Medicine and Rehabilitation | Admitting: Physical Medicine and Rehabilitation

## 2023-07-09 ENCOUNTER — Encounter: Payer: Self-pay | Admitting: Physical Medicine and Rehabilitation

## 2023-07-09 VITALS — BP 123/86 | HR 83 | Ht 63.0 in | Wt 157.0 lb

## 2023-07-09 DIAGNOSIS — F411 Generalized anxiety disorder: Secondary | ICD-10-CM | POA: Diagnosis not present

## 2023-07-09 DIAGNOSIS — G6289 Other specified polyneuropathies: Secondary | ICD-10-CM | POA: Diagnosis not present

## 2023-07-09 DIAGNOSIS — M7918 Myalgia, other site: Secondary | ICD-10-CM

## 2023-07-09 NOTE — Progress Notes (Signed)

## 2023-07-09 NOTE — Addendum Note (Signed)
Addended by: Horton Chin on: 07/09/2023 03:10 PM   Modules accepted: Level of Service

## 2023-07-09 NOTE — Progress Notes (Signed)
Subjective:    Patient ID: Catherine Hubbard, female    DOB: Mar 29, 1961, 62 y.o.   MRN: [id number]  HPI  Catherine Hubbard presents for follow-up of lumbar radicular pain and cervical spine pain, and occipital neuralgia  1) Cervical myofascial pain syndrome: -the trigger point injections helped previously -She continues to have tension and would like to repeat the trigger point injections next visit.   2) Impaired mobility and ADLs -She has difficulty lifting weights and this makes it hard for her to find a job.   3) Cervical spinal stenosis: She currently has pain radiating from her neck down to her ankles.  -The pain is constant, irritating, burning stabbing. -The Meloxicam is helping- she takes when pain is getting worse.  -having pain radiating down her left arm -very bothersome to her -worse when she leans forward to watch TV and when she leans neck backward Radiates down to the lateral aspect of her arm.  -somewhat interested in cervical traction device -has seen PT before  4) Alcohol abuse: -Has led to falls in the past.  -she is doing better.   5) spinal compression fracture: -fell off her bed and suffered a spinal compression fracture -she is wearing a spinal orthosis -she was prescribed oxycodone and used this once yesterday -she takes vitamin D every day.  -interested in trying zynex  6) Depression -fluctuates in severity -still taking effexor and Prozac.  -has had recent deaths in her family.   7) Right knee hematoma -has required bilateral steroid injections  8) Anxiety: -she asks for alprazolam as experienced great benefits with this  9) Bilateral lower extremity neuropathy: -has started recently and inhibits her ability to sleep well  Prior history: She threw things in her home last night due to her pain.   She has pain radiating into her legs, as well as swelling.   Her frustration level has been very high recently.   She would be interested  in a repeat ESI and her pain is now present in the left lower extremity as well.   Prior history:  Lower back pain:  08/25/19: Her lower back pain is worst with extension and is present in her bilateral lower back. Since last visit she has been using a lumbar support brace which does provide some relief. She has been doing her HEP every day. She has had steroid injections in the past that did provide her with relief. She has pain in bilateral lower extremities as well in the anterior and lateral distributions. This pain is worse with movement-walking, standing, and when she lays flat.   10/06/19: On 1/21 she had a right sided L4-L5 epidural steroid injection with Dr. Wynn Banker. Her back pain is now much improved.    Bilateral hip pain: She has had some bilateral hip pain. She has been doing exercises at home for this. But she is now able to walk a lot more. She sometimes goes to the grocery store for 2 hours at a time to do her shopping as well as get exercise in walking. She still has difficulty with stairs.    Poor energy:  2/16: Her energy is now much improved and she has stopped taking the Ritalin.    Depression:  1/05: She say a psychiatrist in the past and was prescribed Prozac, Effexor, and Cymbalta which she found to be helpful for her depression. Last visit we restarted her on Prozac and this did provide some relief. She has not been following with the  psychiatrist any more and does not want to at this time.    2/16: Her mood is much improved with these medications and she is not experiencing any adverse side effects. Her friends tell her she is like a new person and she has excellent energy. 1 month ago she says she felt she could not return to work but now she feels that she may be able to.    Left CMC joint pain: She notes increased swelling in this joint compared to the opposite side and it can sometimes pain her a lot. She has a listed allergy to NSAIDs but said this was gastritis from  ibuprofen more than 10 years ago. She has since taken ibuprofen without issues. Has never had any imaging of her hand or splint or steroid injection.   6/9: She was dong really well except for a month ago, when her back pain and lowe leg cramping returned. She feels soreness in both legs as well as in her back. She would be interested in repeat steroid injection. She feels like her fatigue has worsened and she is sleeping 16 hours per day. She does not have interest in anything. She is most bored in the afternoons. Her husband tries to watch TV with her in the afternoons to stimulate her. She used to like to talk to her neighbors but she has had some issues of her own. Her roommate is a godfearing woman and she does not want to be preached to from her.    Pain Inventory Average Pain 3  Pain Right Now 8 My pain is intermittent, sharp, burning, and aching  In the last 24 hours, has pain interfered with the following? General activity 3 Relation with others 3 Enjoyment of life 3 What TIME of day is your pain at its worst? morning,daytime, evening, night, anytime Sleep (in general) Fair  Pain is worse with: sitting Pain improves with: heat/ice and TENS Relief from Meds: 2         Family History  Problem Relation Age of Onset   Cancer Mother    Stroke Mother    Cancer Father    Social History   Socioeconomic History   Marital status: Married    Spouse name: Not on file   Number of children: Not on file   Years of education: Not on file   Highest education level: Not on file  Occupational History   Not on file  Tobacco Use   Smoking status: Never   Smokeless tobacco: Never   Tobacco comments:    second hand smoker when young. Heavy exposure.  Vaping Use   Vaping status: Never Used  Substance and Sexual Activity   Alcohol use: Yes    Alcohol/week: 6.0 standard drinks of alcohol    Types: 6 Cans of beer per week    Comment: daily, couple of shots ( weekly depending on  day)   Drug use: No   Sexual activity: Not Currently  Other Topics Concern   Not on file  Social History Narrative   Not on file   Social Determinants of Health   Financial Resource Strain: Not on file  Food Insecurity: No Food Insecurity (06/19/2023)   Hunger Vital Sign    Worried About Running Out of Food in the Last Year: Never true    Ran Out of Food in the Last Year: Never true  Transportation Needs: No Transportation Needs (06/19/2023)   PRAPARE - Administrator, Civil Service (Medical):  No    Lack of Transportation (Non-Medical): No  Physical Activity: Not on file  Stress: Not on file  Social Connections: Not on file   Past Surgical History:  Procedure Laterality Date   ANKLE FRACTURE SURGERY Bilateral    CARPAL TUNNEL RELEASE Bilateral    CHOLECYSTECTOMY     COLONOSCOPY  11/2016   multiple   ESOPHAGOGASTRODUODENOSCOPY  11/2016   ESOPHAGOGASTRODUODENOSCOPY N/A 02/01/2017   Procedure: ESOPHAGOGASTRODUODENOSCOPY (EGD);  Surgeon: Iva Boop, MD;  Location[facility] ENDOSCOPY;  Service: Endoscopy;  Laterality: N/A;   ESOPHAGOGASTRODUODENOSCOPY (EGD) WITH PROPOFOL N/A 02/04/2017   Procedure: ESOPHAGOGASTRODUODENOSCOPY (EGD) WITH PROPOFOL;  Surgeon: Meryl Dare, MD;  Location: [facility] ENDOSCOPY;  Service: Endoscopy;  Laterality: N/A;   LAPAROSCOPIC GASTRIC SLEEVE RESECTION  2011   ULNAR NERVE REPAIR Bilateral    Past Medical History:  Diagnosis Date   Alcohol abuse    Asthma    Esophagitis    GERD (gastroesophageal reflux disease)    Headache    Hearing loss    Hypertension    Multiple sclerosis (HCC)    Pancreatitis    Vision abnormalities    BP 123/86   Pulse 83   Ht 5\' 3"  (1.6 m)   Wt 157 lb (71.2 kg)   LMP 08/13/2014 Comment: very few periods  SpO2 95%   BMI 27.81 kg/m   Opioid Risk Score:   Fall Risk Score:  `1  Depression screen [facility] 2/9     07/09/2023   11:28 AM 05/10/2023   10:57 AM 09/21/2022   10:53 AM 08/01/2022   10:38 AM 04/27/2022    11:21 AM 11/06/2021    9:07 AM 06/06/2021    9:16 AM  Depression screen PHQ 2/9  Decreased Interest 3 0 0 0 0 0 1  Down, Depressed, Hopeless 3 0 0 0 0 0 1  PHQ - 2 Score 6 0 0 0 0 0 2    Review of Systems  Constitutional: Negative.   Eyes: Negative.   Respiratory: Negative.    Endocrine: Negative.   Genitourinary: Negative.   Musculoskeletal:  Positive for neck pain.       Left arm  Allergic/Immunologic: Negative.        Objective:   Physical Exam  General: Alert and oriented x 3, No apparent distress HEENT: Head is normocephalic, atraumatic, PERRLA, EOMI, sclera anicteric, oral mucosa pink and moist, dentition intact, ext ear canals clear,  Neck: Supple without JVD or lymphadenopathy Heart: Reg rate and rhythm. No murmurs rubs or gallops Chest: CTA bilaterally without wheezes, rales, or rhonchi; no distress Abdomen: Soft, non-tender, non-distended, bowel sounds positive. Extremities: No clubbing, cyanosis, or edema. Pulses are 2+ Psych: Pt's affect is appropriate. Pt is cooperative Skin: Clean and intact without signs of breakdown Ext: bilateral lower extremity edema Skin: bruising left arm Neuro/Musculoskeletal: Able to walk in office without falls with RW.  Remains sitting most of this session in contrast to last session. Able to bend down and touch her toes with FROM. Diffuse tenderness along spine with gentle palpation. Extension and oblique extension reproduce pain in lumbar spine. 5/5 strength in bilateral arms. 4-/5 in bilateral legs due to diffuse weakness. No focal motor or sensory deficits. + Slump test in bilateral lower extremities.   Has increased swelling at left [facility] joint with increased tenderness to palpation compared to opposite side.  Range of motion of neck limited to 40 degrees bilateral, tingling in neck reproduced with neck flexion.  Psych: pleasant, normal affect,  attention is significantly improved from last visit.        Assessment & Plan:   Catherine Hubbard is a 62 y.o. female with history of alcohol abuse, multiple sclerosis and frequent falls, presenting for follow-up for her diffuse chronic pain and depression., cervical and lumbar spine pain, compression fracture.    1) Poor energy: The Ritalin has really helped with her attention and energy and has helped her to be more active. Her energy is tremendously improved and she no longer requires the Ritalin which she has stopped taking on her own.    2) Depression: Continue Prozac and Effexor for depression.She is experiencing worsening anhedonia and excessive sleep. Increase Prozac to 40mg .   -Has become more severe. Add Amitriptyline 10mg  HS.    Keep a daily log of goals and check them off every day.   Maintain social connections in your life. Continue exercising with your neighbors.    Walk at least 15 minutes every day, preferably outside.    Look for local organizations and programs that can add meaning and friendship to your life.    Make an effort to do activities that you enjoy, such as going to the [employer], shopping, [employer] (cost is an issue here), consider return to work in a sedentary job.      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  -I have personally reviewed Lumbar MRI which shows mild-moderate stenosis. On 1/21 she had a right sided L4-L5 epidural steroid injection with Dr. Wynn Banker with great improvement in symptoms.  Will refer for repeat injection.  -Discussed Meloxicam as an option to consider in the future.  -Discussed current symptoms of pain and history of pain.  -Discussed benefits of exercise in reducing pain. -Discussed following foods that may reduce pain: 1) Ginger (especially studied for arthritis)- reduce leukotriene production to decrease inflammation 2) Blueberries- high in phytonutrients that decrease inflammation 3) Salmon- marine omega-3s reduce joint swelling and pain 4) Pumpkin seeds- reduce  inflammation 5) dark chocolate- reduces inflammation 6) turmeric- reduces inflammation 7) tart cherries - reduce pain and stiffness 8) extra virgin olive oil - its compound olecanthal helps to block prostaglandins  9) chili Hubbard- can be eaten or applied topically via capsaicin 10) mint- helpful for headache, muscle aches, joint pain, and itching 11) garlic- reduces inflammation  Link to further information on diet for chronic pain: [email or web address].[email or web address]    4) Bilateral greater trochanteric pain syndrome: Continue HEP. Educated regarding etiology and treatment of condition.   5) Impaired mobility and ADLs secondary to Multiple Sclerosis, Major Depression Disorder, L4-L5 lumbar stenosis: Patient's overall mobility and mental health are have regressed a little since last visit. She has been trying to obtain disability for financial support and I provided her with the address and phone number of the social security office and advised her that I am happy to help fill out any paperwork she needs. That being said, I advised her that returning to work on a part-time basis may be very good for her, improving her quality of life, physical function, giving her purpose, and improving her financial situation. She is agreeable to this and feels that she may be able to return to work and will look for jobs that interest her and that will not be too strenuous for her (avoiding lifting heavy weights or climbing stairs). She is still nervous about falling at work. -Encouraged to continue looking for work.    6) Left CMC arthritis: Prescribed diclofenac gel and advised  that patient may apply up to 4 times per day. If this does not provide relief asked that patient call our office and I can order an XR and provide a referral for fitting for a splint. If this does not provide relief, we can consider corticosteroid injection.   7) Alcohol abuse:  She drinks at least 3 alcoholic beverages per day. She would like to quit. Discussed some of the risks of alcohol use. Made goal to stop alcohol use given compression fracture. Recommended replacing third drink with Roobois tea.  -Discussed option of support groups or disulfiram.  -prescribed disulfiram  8) Falls: related to alcohol use. If we can get this under control, the falls will decrease.   9) Disrupted circadian rhythm: Sometimes sleeps at 5am and sleeps during the day. Discussed sleep aides.   10) Cervical myofascial pain syndrome: -Repeat trigger point injections next visit.  11) Cervical spinal stenosis: -discussed physical therapy -continue sit and be fit -continue Meloxicam, taking as needed.  -Keep up the turmeric.  -cervical MRI ordered.  -Discussed current symptoms of pain and history of pain.  -Discussed following foods that may reduce pain: 1) Ginger (especially studied for arthritis)- reduce leukotriene production to decrease inflammation 2) Blueberries- high in phytonutrients that decrease inflammation 3) Salmon- marine omega-3s reduce joint swelling and pain 4) Pumpkin seeds- reduce inflammation 5) dark chocolate- reduces inflammation 6) turmeric- reduces inflammation 7) tart cherries - reduce pain and stiffness 8) extra virgin olive oil - its compound olecanthal helps to block prostaglandins  9) chili Hubbard- can be eaten or applied topically via capsaicin 10) mint- helpful for headache, muscle aches, joint pain, and itching 11) garlic- reduces inflammation  Link to further information on diet for chronic pain: http://www.bray.com/   Link to further information on diet for chronic pain: http://www.bray.com/     All questions were encouraged and answered. Follow up with me in mid-April.   12) Compression fracture: -a  little but of goal cheese and green leafy vegetables every day.  -daily 10 minute walk outside.  -can continue oxycodone for very severe pain as prescribed by NSGY. If pain is moderate can consider restarting meloxicam -discussed risks of femur fracture -discussed natural healing process Prescribed Zynex Nexwave -continue amitriptyline  13) Headaches: -waking up every day with headaches .Foods to alleviate migraine:  1) dark leafy greens 2) avocado 3) tuna 4) samon and mackerel 5) beans and legumes Supplements that can be helpful: feverfew, B12, and magnesium  Foods to avoid in migraine: 1) Excessive (or irregular timing) coffee 2) red wine 3) aged cheeses 4) chocolate 5) citrus fruits 6) aspartame and other artifical sweeteners 7) yeast 8) MSG (in processed foods) 9) processed and cured meats 10) nuts and certain seeds 11) chicken livers and other organ meats 12) dairy products like buttermilk, sour cream, and yogurt 13) dried fruits like dates, figs, and raisins 14) garlic 15) onions 16) potato chips 17) pickled foods like olives and sauerkraut 18) some fresh fruits like ripe banana, papaya, red plums, raspberries, kiwi, pineapple 19) tomato-based products  Recommend to keep a migraine diary: rate daily the severity of your headache (1-10) and what foods you eat that day to help determine patterns.    Occipital nerve block procedure note:  Written consent was obtained for the patient. Location for greater and lesser occipital nerves was determined using bony landmarks and points of maximal sensitivity.  The areas were cleaned with alcohol, vapocoolant spray applied, needle drawback performed, and a total of  4 cc of 0.5% Marcaine was injected to both these sites.  There were no complications from the procedure, and it was well tolerated.    -continue amitripytline  20) Hematoma of right knee: -discussed recent bilateral steroid injections  21. HTN: -Advised  checking BP daily at home and logging results to bring into follow-up appointment with PCP and myself. -Reviewed BP meds today.  -Advised regarding healthy foods that can help lower blood pressure and provided with a list: 1) citrus foods- high in vitamins and minerals 2) salmon and other fatty fish - reduces inflammation and oxylipins 3) swiss chard (leafy green)- high level of nitrates 4) pumpkin seeds- one of the best natural sources of magnesium 5) Beans and lentils- high in fiber, magnesium, and potassium 6) Berries- high in flavonoids 7) Amaranth (whole grain, can be cooked similarly to rice and oats)- high in magnesium and fiber 8) Pistachios- even more effective at reducing BP than other nuts 9) Carrots- high in phenolic compounds that relax blood vessels and reduce inflammation 10) Celery- contain phthalides that relax tissues of arterial walls 11) Tomatoes- can also improve cholesterol and reduce risk of heart disease 12) Broccoli- good source of magnesium, calcium, and potassium 13) Greek yogurt: high in potassium and calcium 14) Herbs and spices: Celery seed, cilantro, saffron, lemongrass, black cumin, ginseng, cinnamon, cardamom, sweet basil, and ginger 15) Chia and flax seeds- also help to lower cholesterol and blood sugar 16) Beets- high levels of nitrates that relax blood vessels  17) spinach and bananas- high in potassium  -Provided lise of supplements that can help with hypertension:  1) magnesium: one high quality brand is Bioptemizers since it contains all 7 types of magnesium, otherwise over the counter magnesium gluconate 400mg  is a good option 2) B vitamins 3) vitamin D 4) potassium 5) CoQ10 6) L-arginine 7) Vitamin C 8) Beetroot -Educated that goal BP is 120/80. -Made goal to incorporate some of the above foods into diet.    22) Neuropathy -Discussed Qutenza as an option for neuropathic pain control. Discussed that this is a capsaicin patch, stronger than  capsaicin cream. Discussed that it is currently approved for diabetic peripheral neuropathy and post-herpetic neuralgia, but that it has also shown benefit in treating other forms of neuropathy. Provided patient with link to site to learn more about the patch: https://www.clark.biz/. Discussed that the patch would be placed in office and benefits usually last 3 months. Discussed that unintended exposure to capsaicin can cause severe irritation of eyes, mucous membranes, respiratory tract, and skin, but that Qutenza is a local treatment and does not have the systemic side effects of other nerve medications. Discussed that there may be pain, itching, erythema, and decreased sensory function associated with the application of Qutenza. Side effects usually subside within 1 week. A cold pack of analgesic medications can help with these side effects. Blood pressure can also be increased due to pain associated with administration of the patch.   23) Anxiety: -referred to psych since would like to be started on alprazolam, discussed increased risk of dementia with this medication

## 2023-07-10 ENCOUNTER — Encounter: Payer: Federal, State, Local not specified - PPO | Admitting: Physical Therapy

## 2023-07-10 ENCOUNTER — Ambulatory Visit
Admission: EM | Admit: 2023-07-10 | Discharge: 2023-07-10 | Disposition: A | Discharge status: Home / Self Care | Payer: Federal, State, Local not specified - PPO | Attending: Internal Medicine | Admitting: Internal Medicine

## 2023-07-10 ENCOUNTER — Encounter (HOSPITAL_BASED_OUTPATIENT_CLINIC_OR_DEPARTMENT_OTHER): Payer: Self-pay

## 2023-07-10 ENCOUNTER — Emergency Department (HOSPITAL_BASED_OUTPATIENT_CLINIC_OR_DEPARTMENT_OTHER): Payer: Federal, State, Local not specified - PPO

## 2023-07-10 ENCOUNTER — Emergency Department (HOSPITAL_BASED_OUTPATIENT_CLINIC_OR_DEPARTMENT_OTHER)
Admission: EM | Admit: 2023-07-10 | Discharge: 2023-07-10 | Disposition: A | Discharge status: Home / Self Care | Payer: Federal, State, Local not specified - PPO

## 2023-07-10 ENCOUNTER — Telehealth: Payer: Self-pay

## 2023-07-10 ENCOUNTER — Other Ambulatory Visit: Payer: Self-pay

## 2023-07-10 DIAGNOSIS — K449 Diaphragmatic hernia without obstruction or gangrene: Secondary | ICD-10-CM | POA: Diagnosis not present

## 2023-07-10 DIAGNOSIS — N83209 Unspecified ovarian cyst, unspecified side: Secondary | ICD-10-CM

## 2023-07-10 DIAGNOSIS — R109 Unspecified abdominal pain: Secondary | ICD-10-CM | POA: Diagnosis not present

## 2023-07-10 DIAGNOSIS — R1010 Upper abdominal pain, unspecified: Secondary | ICD-10-CM

## 2023-07-10 DIAGNOSIS — K920 Hematemesis: Secondary | ICD-10-CM | POA: Diagnosis not present

## 2023-07-10 DIAGNOSIS — K209 Esophagitis, unspecified without bleeding: Secondary | ICD-10-CM | POA: Insufficient documentation

## 2023-07-10 DIAGNOSIS — N83202 Unspecified ovarian cyst, left side: Secondary | ICD-10-CM | POA: Diagnosis not present

## 2023-07-10 DIAGNOSIS — N83201 Unspecified ovarian cyst, right side: Secondary | ICD-10-CM | POA: Insufficient documentation

## 2023-07-10 DIAGNOSIS — J449 Chronic obstructive pulmonary disease, unspecified: Secondary | ICD-10-CM | POA: Diagnosis not present

## 2023-07-10 DIAGNOSIS — R079 Chest pain, unspecified: Secondary | ICD-10-CM | POA: Diagnosis not present

## 2023-07-10 DIAGNOSIS — K5730 Diverticulosis of large intestine without perforation or abscess without bleeding: Secondary | ICD-10-CM | POA: Diagnosis not present

## 2023-07-10 DIAGNOSIS — Z7951 Long term (current) use of inhaled steroids: Secondary | ICD-10-CM | POA: Insufficient documentation

## 2023-07-10 DIAGNOSIS — G6289 Other specified polyneuropathies: Secondary | ICD-10-CM

## 2023-07-10 DIAGNOSIS — R0789 Other chest pain: Secondary | ICD-10-CM | POA: Diagnosis not present

## 2023-07-10 DIAGNOSIS — I771 Stricture of artery: Secondary | ICD-10-CM | POA: Diagnosis not present

## 2023-07-10 DIAGNOSIS — S2241XA Multiple fractures of ribs, right side, initial encounter for closed fracture: Secondary | ICD-10-CM | POA: Diagnosis not present

## 2023-07-10 LAB — HEPATIC FUNCTION PANEL
ALT: 13 U/L (ref 0–44)
AST: 28 U/L (ref 15–41)
Albumin: 4.8 g/dL (ref 3.5–5.0)
Alkaline Phosphatase: 75 U/L (ref 38–126)
Bilirubin, Direct: <0.1 mg/dL (ref 0.0–0.2)
Total Bilirubin: 0.9 mg/dL (ref <1.2)
Total Protein: 7.8 g/dL (ref 6.5–8.1)

## 2023-07-10 LAB — BASIC METABOLIC PANEL
Anion gap: 14 (ref 5–15)
BUN: 26 mg/dL — ABNORMAL HIGH (ref 8–23)
CO2: 26 mmol/L (ref 22–32)
Calcium: 11.8 mg/dL — ABNORMAL HIGH (ref 8.9–10.3)
Chloride: 93 mmol/L — ABNORMAL LOW (ref 98–111)
Creatinine, Ser: 1.37 mg/dL — ABNORMAL HIGH (ref 0.44–1.00)
GFR, Estimated: 43 mL/min — ABNORMAL LOW (ref >60)
Glucose, Bld: 100 mg/dL — ABNORMAL HIGH (ref 70–99)
Potassium: 4.2 mmol/L (ref 3.5–5.1)
Sodium: 133 mmol/L — ABNORMAL LOW (ref 135–145)

## 2023-07-10 LAB — CBC
HCT: 39.3 % (ref 36.0–46.0)
Hemoglobin: 13.7 g/dL (ref 12.0–15.0)
MCH: 33.9 pg (ref 26.0–34.0)
MCHC: 34.9 g/dL (ref 30.0–36.0)
MCV: 97.3 fL (ref 80.0–100.0)
Platelets: 436 10*3/uL — ABNORMAL HIGH (ref 150–400)
RBC: 4.04 MIL/uL (ref 3.87–5.11)
RDW: 11.9 % (ref 11.5–15.5)
WBC: 11 10*3/uL — ABNORMAL HIGH (ref 4.0–10.5)
nRBC: 0 % (ref 0.0–0.2)

## 2023-07-10 LAB — TROPONIN I (HIGH SENSITIVITY)
Troponin I (High Sensitivity): 9 ng/L (ref <18)
Troponin I (High Sensitivity): 9 ng/L (ref <18)

## 2023-07-10 LAB — LIPASE, BLOOD: Lipase: 92 U/L — ABNORMAL HIGH (ref 11–51)

## 2023-07-10 MED ORDER — ONDANSETRON HCL 4 MG/2ML IJ SOLN
4.0000 mg | Freq: Once | INTRAMUSCULAR | Status: AC
  Administered 2023-07-10: 4 mg via INTRAVENOUS
  Filled 2023-07-10: qty 2

## 2023-07-10 MED ORDER — PANTOPRAZOLE SODIUM 40 MG PO TBEC: 40.0000 mg | DELAYED_RELEASE_TABLET | Freq: Every day | ORAL | 0 refills | Status: DC

## 2023-07-10 MED ORDER — MORPHINE SULFATE (PF) 4 MG/ML IV SOLN
4.0000 mg | Freq: Once | INTRAVENOUS | Status: AC
  Administered 2023-07-10: 4 mg via INTRAVENOUS
  Filled 2023-07-10: qty 1

## 2023-07-10 MED ORDER — IPRATROPIUM-ALBUTEROL 0.5-2.5 (3) MG/3ML IN SOLN
3.0000 mL | Freq: Once | RESPIRATORY_TRACT | Status: AC
  Administered 2023-07-10: 3 mL via RESPIRATORY_TRACT
  Filled 2023-07-10: qty 3

## 2023-07-10 MED ORDER — QUTENZA (4 PATCH) 8 % EX KIT: 4.0000 | PACK | Freq: Once | CUTANEOUS | 0 refills | Status: AC

## 2023-07-10 MED ORDER — ONDANSETRON HCL 4 MG PO TABS: 4.0000 mg | ORAL_TABLET | Freq: Four times a day (QID) | ORAL | 0 refills | Status: DC

## 2023-07-10 MED ORDER — IOHEXOL 300 MG/ML  SOLN
80.0000 mL | Freq: Once | INTRAMUSCULAR | Status: AC | PRN
Start: 2023-07-10 — End: 2023-07-10
  Administered 2023-07-10: 80 mL via INTRAVENOUS

## 2023-07-10 MED ORDER — PANTOPRAZOLE SODIUM 40 MG PO TBEC
40.0000 mg | DELAYED_RELEASE_TABLET | Freq: Once | ORAL | Status: AC
  Administered 2023-07-10: 40 mg via ORAL
  Filled 2023-07-10: qty 1

## 2023-07-10 MED ORDER — ONDANSETRON 4 MG PO TBDP
8.0000 mg | ORAL_TABLET | Freq: Once | ORAL | Status: AC
  Administered 2023-07-10: 8 mg via ORAL
  Filled 2023-07-10: qty 2

## 2023-07-10 NOTE — ED Triage Notes (Signed)
Pt presents to UC w/ c/o feeling "flushed" on and off for a few months. She has been having nausea and chest pains x3 days. Pt reports nausea, lightheadedness, and dizziness when up and moving and worse at night. The pain is in her chest and back. Reports burping and vomiting with bile, mucous, and specks of blood. Reports she is only able to keep sips of water down x3 days.  Hx MS, and having her esophagus stretched.  Home remedies: Peptobismol, rolaids, tums, prilosec, ginger, ice and heat Took tramadol lastnight which helped

## 2023-07-10 NOTE — ED Provider Triage Note (Signed)
Emergency Medicine Provider Triage Evaluation Note  Catherine Hubbard , a 61 y.o. female  was evaluated in triage.  Pt complains of abdominal pain.  Reports upper abdominal pain intermittent for the past 3 days.  Reports nausea with subsequent emesis.  Reports streaky type hematemesis send symptom onset.  Was seen at urgent care who told her to come to the emergency department for further assessment.  Denies fever, chills, urinary symptoms, change in bowel habits.  Review of Systems  Positive: See above Negative:   Physical Exam  BP 120/85 (BP Location: Left Arm)   Pulse 95   Temp 98 F (36.7 C) (Oral)   Resp 16   Ht 5\' 5"  (1.651 m)   Wt 72.6 kg   LMP 08/13/2014 Comment: very few periods  SpO2 98%   BMI 26.63 kg/m  Gen:   Awake, no distress   Resp:  Normal effort  MSK:   Moves extremities without difficulty  Other:  Upper abdominal tenderness.  Medical Decision Making  Medically screening exam initiated at 5:49 PM.  Appropriate orders placed.  [name] was informed that the remainder of the evaluation will be completed by another provider, this initial triage assessment does not replace that evaluation, and the importance of remaining in the ED until their evaluation is complete.     Peter Garter, [place] 07/10/23 1750

## 2023-07-10 NOTE — ED Notes (Addendum)
Patient is being discharged from the Urgent Care and sent to the Emergency Department via POV. Per Cheri Rous, NP, patient is in need of higher level of care due to abd, vomiting blood. Patient is aware and verbalizes understanding of plan of care.  Vitals:   07/10/23 1028  BP: 95/74  Pulse: 88  Resp: 16  Temp: 97.9 F (36.6 C)  SpO2: 95%

## 2023-07-10 NOTE — Discharge Instructions (Signed)
Please start the Protonix daily.  Take the Zofran as needed for nausea.  Please call and schedule follow-up appointment with the GI doctor at the number provided.  Your CT scan did show some ovarian cysts.  It is recommended that you have a repeat ultrasound to look at this in 6 months.  Please follow-up with your doctor to have this arranged.  Return to the ER for worsening symptoms.

## 2023-07-10 NOTE — ED Triage Notes (Addendum)
Pt reports hot flashes and chills, lightheadedness and dizziness, all over chest pain and back pain associated with coughing and belching, states she is dry heaving up bile with specks of blood; ongoing for 3 days.   She also presents with a large knot/bulge above her right breast that she sustained after she bumped into something when she was carrying a box three weeks ago.

## 2023-07-10 NOTE — ED Provider Notes (Signed)
[facility] Provider Note   CSN: [id number] Arrival date & time: 07/10/23  1521     History {Add pertinent medical, surgical, social history, OB history to HPI:1} Chief Complaint  Patient presents with   Multiple Complaints    Catherine Hubbard is a 62 y.o. female.  62 year old female with past medical history of MS, GERD, epilepsy, COPD, and esophagitis in the past presenting to the emergency department today after some episodes of abdominal pain, nausea, vomiting, and shortness of breath.  The patient states that this has been going now for the past few days.  She states that this has been going on now for the past few days.  She states that her vomit has had some specks of blood after she has vomited multiple times.  She reports that she has been having normal bowel movements with this.  She states that in the past she had esophagitis and a stricture and that this feels relatively similar.  She has been able to swallow.  She denies any blood in her stool or dark stools.  Denies any large-volume hematemesis.  She states that after the episode today that she Hubbard having some shortness of breath and felt that her COPD Hubbard acting up and that she Hubbard wheezing.  She denies any associated chest pain.        Home Medications Prior to Admission medications   Medication Sig Start Date End Date Taking? Authorizing Provider  acetaminophen (TYLENOL) 500 MG tablet Take 2 tablets (1,000 mg total) by mouth every 6 (six) hours as needed for mild pain (pain score 1-3). 06/20/23   Meuth, Brooke A, PA-C  albuterol (VENTOLIN HFA) 108 (90 Base) MCG/ACT inhaler Inhale 2 puffs into the lungs every 6 (six) hours as needed for wheezing or shortness of breath. 06/29/16   [provider]  amitriptyline (ELAVIL) 10 MG tablet TAKE 1 TABLET BY MOUTH EVERYDAY AT BEDTIME Patient taking differently: Take 10 mg by mouth at bedtime as needed for sleep. TAKE 1  TABLET BY MOUTH EVERYDAY AT BEDTIME 03/25/23   Raulkar, Drema Pry, MD  Ascorbic Acid (VITAMIN C PO) Take 1 tablet by mouth daily. 1 tablet daily    [provider]  azithromycin (ZITHROMAX) 250 MG tablet Take 2 tablets on day 1, then 1 tablet daily on days 2 through 5 07/05/23 07/10/23  Saguier, Ramon Dredge, PA-C  B Complex Vitamins (VITAMIN B COMPLEX PO) Take 1 tablet by mouth daily. 1 daily    [provider]  baclofen (LIORESAL) 10 MG tablet Take 1 tablet (10 mg total) by mouth 3 (three) times daily as needed for muscle spasms. 05/01/22   Sater, Pearletha Furl, MD  benzonatate (TESSALON) 100 MG capsule Take 1 capsule (100 mg total) by mouth 3 (three) times daily as needed for cough. 07/05/23   Saguier, Ramon Dredge, PA-C  bismuth subsalicylate (PEPTO BISMOL) 262 MG/15ML suspension Take 30 mLs by mouth daily as needed for indigestion or diarrhea or loose stools (acid reflux).    [provider]  budesonide-formoterol (SYMBICORT) 80-4.5 MCG/ACT inhaler TAKE 2 PUFFS BY MOUTH TWICE A DAY Patient taking differently: Inhale 2 puffs into the lungs 2 (two) times daily as needed (sob). 08/06/22   Saguier, Ramon Dredge, PA-C  Calcium Carbonate Antacid (TUMS PO) Take 2 tablets by mouth 2 (two) times daily as needed (acid reflux).    [provider]  capsaicin topical system (QUTENZA, 4 PATCH,) 8 % Apply 4 patches topically once for  1 dose. 07/25/23 07/25/23  Horton Chin, MD  Cholecalciferol (D2000 ULTRA STRENGTH) 50 MCG (2000 UT) CAPS Take 2,000 Units by mouth daily.    [provider]  diclofenac Sodium (VOLTAREN) 1 % GEL Apply 4 g topically 4 (four) times daily. Patient taking differently: Apply 4 g topically 4 (four) times daily as needed (joint pain). 06/21/22   Raulkar, Drema Pry, MD  fluticasone (FLONASE) 50 MCG/ACT nasal spray SPRAY 2 SPRAYS INTO EACH NOSTRIL EVERY DAY Patient taking differently: Place 2 sprays into both nostrils once as needed for allergies. 10/23/22   Saguier,  Ramon Dredge, PA-C  fluticasone (FLONASE) 50 MCG/ACT nasal spray Place 2 sprays into both nostrils daily. 07/05/23   Saguier, Ramon Dredge, PA-C  furosemide (LASIX) 40 MG tablet Take 1 tablet (40 mg total) by mouth daily. 06/25/23 09/23/23  Georgeanna Lea, MD  gabapentin (NEURONTIN) 100 MG capsule TAKE 1 CAPSULE BY MOUTH THREE TIMES A DAY Patient taking differently: Take 100 mg by mouth 3 (three) times daily as needed (nerve pain). 10/09/22   Saguier, Ramon Dredge, PA-C  Iron, Ferrous Sulfate, 325 (65 Fe) MG TABS 1 tab po bid Patient taking differently: Take 1 tablet by mouth in the morning and at bedtime. 1 tab po bid 11/09/22   Saguier, Ramon Dredge, PA-C  meloxicam (MOBIC) 7.5 MG tablet Take 1 tablet (7.5 mg total) by mouth daily. Patient taking differently: Take 7.5 mg by mouth daily as needed for pain. 12/17/22   Raulkar, Drema Pry, MD  modafinil (PROVIGIL) 200 MG tablet Take one po qAM and 1/2 po qPM Patient taking differently: Take 250 mg by mouth daily. One and one-half tablets by mouth before noon. 05/20/23   Sater, Pearletha Furl, MD  Multiple Vitamin (MULTIVITAMIN WITH MINERALS) TABS tablet Take 1 tablet by mouth daily.    [provider]  nystatin cream (MYCOSTATIN) Apply 1 Application topically 2 (two) times daily. Patient taking differently: Apply 1 Application topically 2 (two) times daily as needed for dry skin. 10/11/22   Saguier, Ramon Dredge, PA-C  ocrelizumab (OCREVUS) 300 MG/10ML injection INFUSE 600MG  INTRAVENOUSLY AS DIRECTED EVERY 6 MONTHS. Patient taking differently: Inject 600 mg into the vein every 6 (six) months. 04/16/23   Sater, Pearletha Furl, MD  olmesartan (BENICAR) 5 MG tablet 2 tab po q day Patient taking differently: Take 10 mg by mouth daily. 2 tab po q day 11/05/22   Saguier, Ramon Dredge, PA-C  oxybutynin (DITROPAN) 5 MG tablet Take 1 tablet (5 mg total) by mouth 2 (two) times daily. Patient taking differently: Take 5 mg by mouth once as needed for bladder spasms. 05/01/22   Sater, Pearletha Furl, MD   potassium chloride SA (KLOR-CON M) 20 MEQ tablet Take 1 tablet (20 mEq total) by mouth daily. 06/25/23   Georgeanna Lea, MD  Probiotic Product (PROBIOTIC DAILY PO) Take 1 tablet by mouth 3 (three) times a week. 1 daily    [provider]  traMADol (ULTRAM) 50 MG tablet 1 tab po bid prn severe pain 07/05/23   Saguier, Ramon Dredge, PA-C      Allergies    Ibuprofen and Nsaids    Review of Systems   Review of Systems  Respiratory:  Positive for shortness of breath.   Gastrointestinal:  Positive for abdominal pain.  All other systems reviewed and are negative.   Physical Exam Updated Vital Signs BP 120/85 (BP Location: Left Arm)   Pulse 95   Temp 98 F (36.7 C) (Oral)   Resp 16   Ht 5'  5" (1.651 m)   Wt 72.6 kg   LMP 08/13/2014 Comment: very few periods  SpO2 98%   BMI 26.63 kg/m  Physical Exam Vitals and nursing note reviewed.   Gen: Chronically ill appearing, NAD Eyes: PERRL, EOMI HEENT: no oropharyngeal swelling Neck: trachea midline Resp: clear to auscultation bilaterally Card: RRR, no murmurs, rubs, or gallops Abd: Tender over the epigastrium with no guarding or rebound Extremities: no calf tenderness, no edema Vascular: 2+ radial pulses bilaterally, 2+ DP pulses bilaterally Skin: no rashes Psyc: acting appropriately   ED Results / Procedures / Treatments   Labs (all labs ordered are listed, but only abnormal results are displayed) Labs Reviewed  CBC - Abnormal; Notable for the following components:      Result Value   WBC 11.0 (*)    Platelets 436 (*)    All other components within normal limits  BASIC METABOLIC PANEL - Abnormal; Notable for the following components:   Sodium 133 (*)    Chloride 93 (*)    Glucose, Bld 100 (*)    BUN 26 (*)    Creatinine, Ser 1.37 (*)    Calcium 11.8 (*)    GFR, Estimated 43 (*)    All other components within normal limits  LIPASE, BLOOD - Abnormal; Notable for the following components:   Lipase 92 (*)    All  other components within normal limits  HEPATIC FUNCTION PANEL  URINALYSIS, ROUTINE W REFLEX MICROSCOPIC  TROPONIN I (HIGH SENSITIVITY)  TROPONIN I (HIGH SENSITIVITY)    EKG EKG Interpretation Date/Time:  Wednesday July 10 2023 15:51:01 EST Ventricular Rate:  97 PR Interval:  156 QRS Duration:  94 QT Interval:  366 QTC Calculation: 464 R Axis:   174  Text Interpretation: *** Suspect arm lead reversal, interpretation assumes no reversal Normal sinus rhythm Right ventricular hypertrophy Possible Lateral infarct , age undetermined Abnormal ECG When compared with ECG of 25-Jun-2023 15:06, Similar in morphology to previous EKGs Confirmed by Beckey Downing [phone]) on 07/10/2023 3:57:22 PM  Radiology DG Chest 2 View  Result Date: 07/10/2023 CLINICAL DATA:  Chest pain for 2 days. EXAM: CHEST - 2 VIEW COMPARISON:  07/04/2023 FINDINGS: The cardiomediastinal contours are unchanged. Tortuous descending aorta. Hiatal hernia. Pulmonary vasculature is normal. No consolidation, pleural effusion, or pneumothorax. Again seen remote right rib fractures. No acute osseous abnormalities are seen. Chronic compression deformities of the midthoracic spine and thoracolumbar junction. IMPRESSION: 1. No acute findings. 2. Hiatal hernia. Electronically Signed   By: Narda Rutherford M.D.   On: 07/10/2023 19:29    Procedures Procedures  {Document cardiac monitor, telemetry assessment procedure when appropriate:1}  Medications Ordered in ED Medications  ipratropium-albuterol (DUONEB) 0.5-2.5 (3) MG/3ML nebulizer solution 3 mL (has no administration in time range)  morphine (PF) 4 MG/ML injection 4 mg (has no administration in time range)  ondansetron (ZOFRAN) injection 4 mg (has no administration in time range)  ondansetron (ZOFRAN-ODT) disintegrating tablet 8 mg (8 mg Oral Given 07/10/23 1752)  iohexol (OMNIPAQUE) 300 MG/ML solution 80 mL (80 mLs Intravenous Contrast Given 07/10/23 1838)    ED Course/ Medical  Decision Making/ A&P   {   Click here for ABCD2, HEART and other calculatorsREFRESH Note before signing :1}                              Medical Decision Making 62 year old female with past medical history of MS, GERD, esophagitis, and COPD presenting  to the emergency department today with abdominal pain and reports of small-volume hematemesis.  Over the evaluate patient here with basic labs Wels and EKG, chest x-ray, and troponin for further evaluation for ACS, pulm edema, pulmonary infiltrates, or pneumothorax.  Will obtain LFTs and lipase to evaluate for hepatobiliary pathology or pancreatitis.  This will also evaluate for anemia given the small-volume hematemesis.  I will also obtain a CT scan of her abdomen given her age to evaluate for malignancy, hepatobiliary pathology, pancreatitis, colitis, diverticulitis, or other intra-abdominal pathology.  I will give her morphine and Zofran for symptoms.  Will give her a DuoNeb here for the shortness of breath.  I will reevaluate for ultimate disposition.  The patient's labs are reassuring overall.  Her creatinine is stable compared to your most recent value in our system.  The patient's hemoglobin is stable.  There are findings consistent with esophagitis on her CT scan.  She will be on Protonix here.  Amount and/or Complexity of Data Reviewed Labs: ordered. Radiology: ordered.  Risk Prescription drug management.   ***  {Document critical care time when appropriate:1} {Document review of labs and clinical decision tools ie heart score, Chads2Vasc2 etc:1}  {Document your independent review of radiology images, and any outside records:1} {Document your discussion with family members, caretakers, and with consultants:1} {Document social determinants of health affecting pt's care:1} {Document your decision making why or why not admission, treatments were needed:1} Final Clinical Impression(s) / ED Diagnoses Final diagnoses:  None    Rx / DC  Orders ED Discharge Orders     None

## 2023-07-10 NOTE — ED Provider Notes (Addendum)
[facility]    CSN: [id number] Arrival date & time: 07/10/23  4098      History   Chief Complaint No chief complaint on file.   HPI Catherine Hubbard is a 62 y.o. female with a complex medical history including multiple sclerosis, gastric bypass surgery, EtOH presents with upper abdominal pain and vomiting x 4 days.  Reports she has been having a constant upper abdominal pain that she describes as a burning twisting pain that radiates through towards her back.  She reports episodes of vomiting with blood.  Endorses chills but denies any fevers, constipation, diarrhea.  Reports has a history of narrow esophagus that has required stretching in the past and states she is overdue for this.  She does have a history of GERD and does take Prilosec and Tums which has not helped her symptoms.  Abdominal surgeries include gastric bypass and cholecystectomy.  She is not on blood thinning medications.  She states last night the pain was so severe it woke her out of her sleep causing her to scream in pain.  She considered going to the ER last night but her husband would not take her.  No other concerns at this time.  HPI  Past Medical History:  Diagnosis Date   Alcohol abuse    Asthma    Esophagitis    GERD (gastroesophageal reflux disease)    Headache    Hearing loss    Hypertension    Multiple sclerosis (HCC)    Pancreatitis    Vision abnormalities     Patient Active Problem List   Diagnosis Date Noted   Leg swelling 06/25/2023   Chest wall hematoma 06/19/2023   Primary osteoarthritis of both knees 11/29/2022   Hematoma 11/29/2022   Post-traumatic osteoarthritis of right ankle 11/29/2022   Impingement syndrome of left ankle 11/29/2022   Iron deficiency anemia 11/29/2022   Compression fracture of L1 lumbar vertebra (HCC) 11/29/2022   Estrogen deficiency 11/29/2022   Skin tear of forearm without complication, subsequent encounter 04/06/2022   Hyponatremia 03/28/2022    Frequent falls 03/28/2022   Hypomagnesemia 03/28/2022   Alcohol abuse 03/28/2022   GERD without esophagitis 03/28/2022   History of alcohol abuse 12/02/2019   Multiple rib fractures 04/14/2019   Multiple fractures of ribs, bilateral, initial encounter for closed fracture 04/14/2019   Closed 3-part fracture of proximal humerus with routine healing 04/01/2019   Multiple sclerosis exacerbation (HCC) 11/16/2018   Exacerbation of multiple sclerosis (HCC) 11/16/2018   Elevated troponin 09/26/2018   Acute hypokalemia 09/25/2018   Arthritis of carpometacarpal joint 03/27/2018   Nocturnal leg cramps 03/26/2018   Syncope due to orthostatic hypotension 02/12/2018   Diarrhea 02/08/2018   Urinary tract infection without hematuria 02/08/2018   Hematoma of scalp 02/08/2018   Neck pain 01/20/2018   Urine frequency 02/26/2017   Foreign body in esophagus    Esophageal stricture    Intractable nausea and vomiting 01/31/2017   Dysphagia    Severe protein-calorie malnutrition (HCC)    Potassium disorder 01/30/2017   Cystitis 01/30/2017   Periumbilical hernia 11/28/2016   Dysuria 11/28/2016   Calculus of gallbladder with acute on chronic cholecystitis without obstruction 11/28/2016   Costochondritis 05/11/2016   Chronic chest wall pain 04/17/2016   Trochanteric bursitis of both hips 03/28/2016   Mild intermittent asthma without complication 11/10/2015   Screening for colon cancer 11/10/2015   Routine history and physical examination of adult 11/10/2015   Bilateral low back pain with bilateral sciatica  07/13/2015   Lumbar radicular pain 03/21/2015   Other fatigue 01/11/2015   Dysesthesia 01/11/2015   Urinary urgency 01/11/2015   Relapsing remitting multiple sclerosis (HCC) 10/06/2014   Numbness 10/06/2014   Ataxic gait 10/06/2014   High risk medication use 10/06/2014   Gastric bypass status for obesity 10/06/2014   Cognitive changes 10/06/2014   Depression with anxiety 10/06/2014    Restless leg 10/06/2014   Insomnia 10/06/2014   Essential hypertension 10/06/2014   Postoperative state 10/06/2014   Multiple sclerosis (HCC) 02/16/2014   Difficulty hearing 01/13/2014   Other muscle spasm 01/13/2014   Hearing loss 01/13/2014    Past Surgical History:  Procedure Laterality Date   ANKLE FRACTURE SURGERY Bilateral    CARPAL TUNNEL RELEASE Bilateral    CHOLECYSTECTOMY     COLONOSCOPY  11/2016   multiple   ESOPHAGOGASTRODUODENOSCOPY  11/2016   ESOPHAGOGASTRODUODENOSCOPY N/A 02/01/2017   Procedure: ESOPHAGOGASTRODUODENOSCOPY (EGD);  Surgeon: Iva Boop, MD;  Location: [facility] ENDOSCOPY;  Service: Endoscopy;  Laterality: N/A;   ESOPHAGOGASTRODUODENOSCOPY (EGD) WITH PROPOFOL N/A 02/04/2017   Procedure: ESOPHAGOGASTRODUODENOSCOPY (EGD) WITH PROPOFOL;  Surgeon: Meryl Dare, MD;  Location: [facility] ENDOSCOPY;  Service: Endoscopy;  Laterality: N/A;   LAPAROSCOPIC GASTRIC SLEEVE RESECTION  2011   ULNAR NERVE REPAIR Bilateral     OB History   No obstetric history on file.      Home Medications    Prior to Admission medications   Medication Sig Start Date End Date Taking? Authorizing Provider  acetaminophen (TYLENOL) 500 MG tablet Take 2 tablets (1,000 mg total) by mouth every 6 (six) hours as needed for mild pain (pain score 1-3). 06/20/23   Meuth, Brooke A, PA-C  albuterol (VENTOLIN HFA) 108 (90 Base) MCG/ACT inhaler Inhale 2 puffs into the lungs every 6 (six) hours as needed for wheezing or shortness of breath. 06/29/16   [provider]  amitriptyline (ELAVIL) 10 MG tablet TAKE 1 TABLET BY MOUTH EVERYDAY AT BEDTIME Patient taking differently: Take 10 mg by mouth at bedtime as needed for sleep. TAKE 1 TABLET BY MOUTH EVERYDAY AT BEDTIME 03/25/23   Raulkar, Drema Pry, MD  Ascorbic Acid (VITAMIN C PO) Take 1 tablet by mouth daily. 1 tablet daily    [provider]  azithromycin (ZITHROMAX) 250 MG tablet Take 2 tablets on day 1, then 1 tablet daily on days 2  through 5 07/05/23 07/10/23  Saguier, Ramon Dredge, PA-C  B Complex Vitamins (VITAMIN B COMPLEX PO) Take 1 tablet by mouth daily. 1 daily    [provider]  baclofen (LIORESAL) 10 MG tablet Take 1 tablet (10 mg total) by mouth 3 (three) times daily as needed for muscle spasms. 05/01/22   Sater, Pearletha Furl, MD  benzonatate (TESSALON) 100 MG capsule Take 1 capsule (100 mg total) by mouth 3 (three) times daily as needed for cough. 07/05/23   Saguier, Ramon Dredge, PA-C  bismuth subsalicylate (PEPTO BISMOL) 262 MG/15ML suspension Take 30 mLs by mouth daily as needed for indigestion or diarrhea or loose stools (acid reflux).    [provider]  budesonide-formoterol (SYMBICORT) 80-4.5 MCG/ACT inhaler TAKE 2 PUFFS BY MOUTH TWICE A DAY Patient taking differently: Inhale 2 puffs into the lungs 2 (two) times daily as needed (sob). 08/06/22   Saguier, Ramon Dredge, PA-C  Calcium Carbonate Antacid (TUMS PO) Take 2 tablets by mouth 2 (two) times daily as needed (acid reflux).    [provider]  capsaicin topical system (QUTENZA, 4 PATCH,) 8 % Apply 4 patches topically  once for 1 dose. 07/25/23 07/25/23  Horton Chin, MD  Cholecalciferol (D2000 ULTRA STRENGTH) 50 MCG (2000 UT) CAPS Take 2,000 Units by mouth daily.    [provider]  diclofenac Sodium (VOLTAREN) 1 % GEL Apply 4 g topically 4 (four) times daily. Patient taking differently: Apply 4 g topically 4 (four) times daily as needed (joint pain). 06/21/22   Raulkar, Drema Pry, MD  fluticasone (FLONASE) 50 MCG/ACT nasal spray SPRAY 2 SPRAYS INTO EACH NOSTRIL EVERY DAY Patient taking differently: Place 2 sprays into both nostrils once as needed for allergies. 10/23/22   Saguier, Ramon Dredge, PA-C  fluticasone (FLONASE) 50 MCG/ACT nasal spray Place 2 sprays into both nostrils daily. 07/05/23   Saguier, Ramon Dredge, PA-C  furosemide (LASIX) 40 MG tablet Take 1 tablet (40 mg total) by mouth daily. 06/25/23 09/23/23  Georgeanna Lea, MD  gabapentin  (NEURONTIN) 100 MG capsule TAKE 1 CAPSULE BY MOUTH THREE TIMES A DAY Patient taking differently: Take 100 mg by mouth 3 (three) times daily as needed (nerve pain). 10/09/22   Saguier, Ramon Dredge, PA-C  Iron, Ferrous Sulfate, 325 (65 Fe) MG TABS 1 tab po bid Patient taking differently: Take 1 tablet by mouth in the morning and at bedtime. 1 tab po bid 11/09/22   Saguier, Ramon Dredge, PA-C  meloxicam (MOBIC) 7.5 MG tablet Take 1 tablet (7.5 mg total) by mouth daily. Patient taking differently: Take 7.5 mg by mouth daily as needed for pain. 12/17/22   Raulkar, Drema Pry, MD  modafinil (PROVIGIL) 200 MG tablet Take one po qAM and 1/2 po qPM Patient taking differently: Take 250 mg by mouth daily. One and one-half tablets by mouth before noon. 05/20/23   Sater, Pearletha Furl, MD  Multiple Vitamin (MULTIVITAMIN WITH MINERALS) TABS tablet Take 1 tablet by mouth daily.    [provider]  nystatin cream (MYCOSTATIN) Apply 1 Application topically 2 (two) times daily. Patient taking differently: Apply 1 Application topically 2 (two) times daily as needed for dry skin. 10/11/22   Saguier, Ramon Dredge, PA-C  ocrelizumab (OCREVUS) 300 MG/10ML injection INFUSE 600MG  INTRAVENOUSLY AS DIRECTED EVERY 6 MONTHS. Patient taking differently: Inject 600 mg into the vein every 6 (six) months. 04/16/23   Sater, Pearletha Furl, MD  olmesartan (BENICAR) 5 MG tablet 2 tab po q day Patient taking differently: Take 10 mg by mouth daily. 2 tab po q day 11/05/22   Saguier, Ramon Dredge, PA-C  oxybutynin (DITROPAN) 5 MG tablet Take 1 tablet (5 mg total) by mouth 2 (two) times daily. Patient taking differently: Take 5 mg by mouth once as needed for bladder spasms. 05/01/22   Sater, Pearletha Furl, MD  potassium chloride SA (KLOR-CON M) 20 MEQ tablet Take 1 tablet (20 mEq total) by mouth daily. 06/25/23   Georgeanna Lea, MD  Probiotic Product (PROBIOTIC DAILY PO) Take 1 tablet by mouth 3 (three) times a week. 1 daily    [provider]  traMADol  (ULTRAM) 50 MG tablet 1 tab po bid prn severe pain 07/05/23   Saguier, Ramon Dredge, PA-C    Family History Family History  Problem Relation Age of Onset   Cancer Mother    Stroke Mother    Cancer Father     Social History Social History   Tobacco Use   Smoking status: Never   Smokeless tobacco: Never   Tobacco comments:    second hand smoker when young. Heavy exposure.  Vaping Use   Vaping status: Never Used  Substance Use Topics  Alcohol use: Yes    Alcohol/week: 6.0 standard drinks of alcohol    Types: 6 Cans of beer per week    Comment: daily, couple of shots ( weekly depending on day)   Drug use: No     Allergies   Ibuprofen and Nsaids   Review of Systems Review of Systems  Constitutional:  Positive for chills.  Gastrointestinal:  Positive for abdominal pain and vomiting.     Physical Exam Triage Vital Signs ED Triage Vitals  Encounter Vitals Group     BP 07/10/23 1028 95/74     Systolic BP Percentile --      Diastolic BP Percentile --      Pulse Rate 07/10/23 1028 88     Resp 07/10/23 1028 16     Temp 07/10/23 1028 97.9 F (36.6 C)     Temp Source 07/10/23 1028 Oral     SpO2 07/10/23 1028 95 %     Weight --      Height --      Head Circumference --      Peak Flow --      Pain Score 07/10/23 1032 8     Pain Loc --      Pain Education --      Exclude from Growth Chart --    No data found.  Updated Vital Signs BP 95/74 (BP Location: Right Arm)   Pulse 88   Temp 97.9 F (36.6 C) (Oral)   Resp 16   LMP 08/13/2014 Comment: very few periods  SpO2 95%   Visual Acuity Right Eye Distance:   Left Eye Distance:   Bilateral Distance:    Right Eye Near:   Left Eye Near:    Bilateral Near:     Physical Exam Vitals and nursing note reviewed.  Constitutional:      General: She is not in acute distress.    Appearance: Normal appearance. She is not ill-appearing.  HENT:     Head: Normocephalic and atraumatic.  Eyes:     Pupils: Pupils are  equal, round, and reactive to light.  Cardiovascular:     Rate and Rhythm: Normal rate.  Pulmonary:     Effort: Pulmonary effort is normal.  Abdominal:     Tenderness: There is abdominal tenderness in the right upper quadrant and left upper quadrant.     Comments: Significant tenderness with palpation to right upper and left upper quadrants.  Skin:    General: Skin is warm and dry.  Neurological:     General: No focal deficit present.     Mental Status: She is alert and oriented to person, place, and time.  Psychiatric:        Mood and Affect: Mood normal.        Behavior: Behavior normal.      UC Treatments / Results  Labs (all labs ordered are listed, but only abnormal results are displayed) Labs Reviewed - No data to display  EKG   Radiology No results found.  Procedures Procedures (including critical care time)  Medications Ordered in UC Medications - No data to display  Initial Impression / Assessment and Plan / UC Course  I have reviewed the triage vital signs and the nursing notes.  Pertinent labs & imaging results that were available during my care of the patient were reviewed by me and considered in my medical decision making (see chart for details).     Reviewed exam and symptoms with patient.  Discussed  limitations and abilities of urgent care.  Patient presenting with vomiting blood and severe abdominal pain x 4 days.  Given her symptoms and exam I advise she go to the ER for further workup.  She is in agreement with plan will go POV to the emergency room.  She was instructed to pull over and call 911 for any worsening symptoms that occur in transit and she verbalized understanding. Final Clinical Impressions(s) / UC Diagnoses   Final diagnoses:  Pain of upper abdomen  Hematemesis, unspecified whether nausea present     Discharge Instructions      Please go to the ER for further workup of your symptoms    ED Prescriptions   None    PDMP not  reviewed this encounter.   Radford Pax, NP 07/10/23 1058    Radford Pax, NP 07/10/23 1058

## 2023-07-10 NOTE — Telephone Encounter (Signed)
Rx sent to specialty pharmacy

## 2023-07-10 NOTE — Discharge Instructions (Addendum)
Please go to the ER for further workup of your symptoms  

## 2023-07-11 ENCOUNTER — Telehealth: Payer: Self-pay

## 2023-07-11 ENCOUNTER — Telehealth: Payer: Self-pay | Admitting: Gastroenterology

## 2023-07-11 DIAGNOSIS — I10 Essential (primary) hypertension: Secondary | ICD-10-CM

## 2023-07-11 NOTE — Telephone Encounter (Signed)
Good afternoon Dr. Glee Arvin MD PM  The following patient is being referred to [place] after an ED visit esophagitis. She has prior history with [facility]. She no longer wants to continue her care there because she is not happy with them. Records are available with care everywhere. Please review and advise of scheduling. Thank you.

## 2023-07-11 NOTE — Telephone Encounter (Signed)
Spoke with pt regarding Lab results per Dr. Vanetta Shawl note. She will decrease potassium to and come back for BMP in 1 week. Routed to PCP.

## 2023-07-12 ENCOUNTER — Telehealth: Payer: Self-pay

## 2023-07-12 MED ORDER — HYDROCODONE-ACETAMINOPHEN 5-325 MG PO TABS: ORAL_TABLET | ORAL | 0 refills | Status: DC

## 2023-07-12 NOTE — Addendum Note (Signed)
Addended by: Gwenevere Abbot on: 07/12/2023 01:15 PM   Modules accepted: Orders

## 2023-07-12 NOTE — Telephone Encounter (Signed)
PA submitted for [facility]

## 2023-07-15 NOTE — Telephone Encounter (Signed)
Left VM to advise of the denial for transfer request.

## 2023-07-17 ENCOUNTER — Emergency Department (HOSPITAL_COMMUNITY)
Admission: EM | Admit: 2023-07-17 | Discharge: 2023-07-17 | Disposition: A | Discharge status: Home / Self Care | Payer: Federal, State, Local not specified - PPO | Attending: Emergency Medicine | Admitting: Emergency Medicine

## 2023-07-17 ENCOUNTER — Emergency Department (HOSPITAL_COMMUNITY): Payer: Federal, State, Local not specified - PPO

## 2023-07-17 ENCOUNTER — Other Ambulatory Visit: Payer: Self-pay

## 2023-07-17 DIAGNOSIS — G35 Multiple sclerosis: Secondary | ICD-10-CM | POA: Insufficient documentation

## 2023-07-17 DIAGNOSIS — R059 Cough, unspecified: Secondary | ICD-10-CM | POA: Diagnosis not present

## 2023-07-17 DIAGNOSIS — R042 Hemoptysis: Secondary | ICD-10-CM | POA: Diagnosis not present

## 2023-07-17 DIAGNOSIS — S2241XA Multiple fractures of ribs, right side, initial encounter for closed fracture: Secondary | ICD-10-CM | POA: Diagnosis not present

## 2023-07-17 DIAGNOSIS — M4802 Spinal stenosis, cervical region: Secondary | ICD-10-CM | POA: Diagnosis not present

## 2023-07-17 DIAGNOSIS — R29898 Other symptoms and signs involving the musculoskeletal system: Secondary | ICD-10-CM

## 2023-07-17 DIAGNOSIS — M50221 Other cervical disc displacement at C4-C5 level: Secondary | ICD-10-CM | POA: Diagnosis not present

## 2023-07-17 DIAGNOSIS — Z20822 Contact with and (suspected) exposure to covid-19: Secondary | ICD-10-CM | POA: Insufficient documentation

## 2023-07-17 DIAGNOSIS — R29818 Other symptoms and signs involving the nervous system: Secondary | ICD-10-CM | POA: Diagnosis not present

## 2023-07-17 DIAGNOSIS — M50222 Other cervical disc displacement at C5-C6 level: Secondary | ICD-10-CM | POA: Diagnosis not present

## 2023-07-17 DIAGNOSIS — K449 Diaphragmatic hernia without obstruction or gangrene: Secondary | ICD-10-CM | POA: Diagnosis not present

## 2023-07-17 DIAGNOSIS — R9082 White matter disease, unspecified: Secondary | ICD-10-CM | POA: Diagnosis not present

## 2023-07-17 DIAGNOSIS — R531 Weakness: Secondary | ICD-10-CM | POA: Diagnosis not present

## 2023-07-17 DIAGNOSIS — R42 Dizziness and giddiness: Secondary | ICD-10-CM | POA: Diagnosis not present

## 2023-07-17 DIAGNOSIS — G9389 Other specified disorders of brain: Secondary | ICD-10-CM | POA: Diagnosis not present

## 2023-07-17 DIAGNOSIS — M47812 Spondylosis without myelopathy or radiculopathy, cervical region: Secondary | ICD-10-CM | POA: Diagnosis not present

## 2023-07-17 LAB — COMPREHENSIVE METABOLIC PANEL
ALT: 12 U/L (ref 0–44)
AST: 23 U/L (ref 15–41)
Albumin: 4 g/dL (ref 3.5–5.0)
Alkaline Phosphatase: 61 U/L (ref 38–126)
Anion gap: 9 (ref 5–15)
BUN: 35 mg/dL — ABNORMAL HIGH (ref 8–23)
CO2: 26 mmol/L (ref 22–32)
Calcium: 10 mg/dL (ref 8.9–10.3)
Chloride: 101 mmol/L (ref 98–111)
Creatinine, Ser: 1.58 mg/dL — ABNORMAL HIGH (ref 0.44–1.00)
GFR, Estimated: 37 mL/min — ABNORMAL LOW (ref >60)
Glucose, Bld: 98 mg/dL (ref 70–99)
Potassium: 4.5 mmol/L (ref 3.5–5.1)
Sodium: 136 mmol/L (ref 135–145)
Total Bilirubin: 0.5 mg/dL (ref <1.2)
Total Protein: 6.7 g/dL (ref 6.5–8.1)

## 2023-07-17 LAB — CBC
HCT: 37.2 % (ref 36.0–46.0)
Hemoglobin: 12.5 g/dL (ref 12.0–15.0)
MCH: 33.9 pg (ref 26.0–34.0)
MCHC: 33.6 g/dL (ref 30.0–36.0)
MCV: 100.8 fL — ABNORMAL HIGH (ref 80.0–100.0)
Platelets: 345 10*3/uL (ref 150–400)
RBC: 3.69 MIL/uL — ABNORMAL LOW (ref 3.87–5.11)
RDW: 11.8 % (ref 11.5–15.5)
WBC: 9.7 10*3/uL (ref 4.0–10.5)
nRBC: 0 % (ref 0.0–0.2)

## 2023-07-17 LAB — RESP PANEL BY RT-PCR (RSV, FLU A&B, COVID)  RVPGX2
Influenza A by PCR: NEGATIVE
Influenza B by PCR: NEGATIVE
Resp Syncytial Virus by PCR: NEGATIVE
SARS Coronavirus 2 by RT PCR: NEGATIVE

## 2023-07-17 MED ORDER — LORAZEPAM 2 MG/ML IJ SOLN
1.0000 mg | Freq: Once | INTRAMUSCULAR | Status: AC
  Administered 2023-07-17: 1 mg via INTRAVENOUS
  Filled 2023-07-17: qty 1

## 2023-07-17 MED ORDER — GADOBUTROL 1 MMOL/ML IV SOLN
7.0000 mL | Freq: Once | INTRAVENOUS | Status: AC | PRN
  Administered 2023-07-17: 7 mL via INTRAVENOUS

## 2023-07-17 NOTE — ED Provider Notes (Signed)
[place] EMERGENCY DEPARTMENT AT [facility] Provider Note   CSN: [id number] Arrival date & time: 07/17/23  0234     History  Chief Complaint  Patient presents with   Dizziness    Catherine Hubbard is a 62 y.o. female.  Patient complains of difficulty holding onto things for the past 2 days.  Patient reports that when she holds something in her hand it drops out.  Patient states that she has also had some double vision.  Patient reports she has a history of MS she is followed by Dr. Iran Ouch.  Patient reports that she has been doing well with her treatments.  The history is provided by the patient. No language interpreter was used.  Dizziness Quality:  Lightheadedness Severity:  Moderate Onset quality:  Gradual Timing:  Constant Progression:  Worsening Chronicity:  New Context: not with loss of consciousness   Relieved by:  Nothing Worsened by:  Nothing Ineffective treatments:  None tried Associated symptoms: weakness   Associated symptoms: no syncope   Risk factors: no new medications        Home Medications Prior to Admission medications   Medication Sig Start Date End Date Taking? Authorizing Provider  acetaminophen (TYLENOL) 500 MG tablet Take 2 tablets (1,000 mg total) by mouth every 6 (six) hours as needed for mild pain (pain score 1-3). 06/20/23   Meuth, Brooke A, PA-C  albuterol (VENTOLIN HFA) 108 (90 Base) MCG/ACT inhaler Inhale 2 puffs into the lungs every 6 (six) hours as needed for wheezing or shortness of breath. 06/29/16   [provider]  amitriptyline (ELAVIL) 10 MG tablet TAKE 1 TABLET BY MOUTH EVERYDAY AT BEDTIME Patient taking differently: Take 10 mg by mouth at bedtime as needed for sleep. TAKE 1 TABLET BY MOUTH EVERYDAY AT BEDTIME 03/25/23   Raulkar, Drema Pry, MD  Ascorbic Acid (VITAMIN C PO) Take 1 tablet by mouth daily. 1 tablet daily    [provider]  B Complex Vitamins (VITAMIN B COMPLEX PO) Take 1 tablet by  mouth daily. 1 daily    [provider]  baclofen (LIORESAL) 10 MG tablet Take 1 tablet (10 mg total) by mouth 3 (three) times daily as needed for muscle spasms. 05/01/22   Sater, Pearletha Furl, MD  benzonatate (TESSALON) 100 MG capsule Take 1 capsule (100 mg total) by mouth 3 (three) times daily as needed for cough. 07/05/23   Saguier, Ramon Dredge, PA-C  bismuth subsalicylate (PEPTO BISMOL) 262 MG/15ML suspension Take 30 mLs by mouth daily as needed for indigestion or diarrhea or loose stools (acid reflux).    [provider]  budesonide-formoterol (SYMBICORT) 80-4.5 MCG/ACT inhaler TAKE 2 PUFFS BY MOUTH TWICE A DAY Patient taking differently: Inhale 2 puffs into the lungs 2 (two) times daily as needed (sob). 08/06/22   Saguier, Ramon Dredge, PA-C  Calcium Carbonate Antacid (TUMS PO) Take 2 tablets by mouth 2 (two) times daily as needed (acid reflux).    [provider]  capsaicin topical system (QUTENZA, 4 PATCH,) 8 % Apply 4 patches topically once for 1 dose. 07/25/23 07/25/23  Horton Chin, MD  Cholecalciferol (D2000 ULTRA STRENGTH) 50 MCG (2000 UT) CAPS Take 2,000 Units by mouth daily.    [provider]  diclofenac Sodium (VOLTAREN) 1 % GEL Apply 4 g topically 4 (four) times daily. Patient taking differently: Apply 4 g topically 4 (four) times daily as needed (joint pain). 06/21/22   Raulkar, Drema Pry, MD  fluticasone (FLONASE) 50 MCG/ACT nasal spray  SPRAY 2 SPRAYS INTO EACH NOSTRIL EVERY DAY Patient taking differently: Place 2 sprays into both nostrils once as needed for allergies. 10/23/22   Saguier, Ramon Dredge, PA-C  fluticasone (FLONASE) 50 MCG/ACT nasal spray Place 2 sprays into both nostrils daily. 07/05/23   Saguier, Ramon Dredge, PA-C  furosemide (LASIX) 40 MG tablet Take 1 tablet (40 mg total) by mouth daily. 06/25/23 09/23/23  Georgeanna Lea, MD  gabapentin (NEURONTIN) 100 MG capsule TAKE 1 CAPSULE BY MOUTH THREE TIMES A DAY Patient taking differently: Take 100 mg by  mouth 3 (three) times daily as needed (nerve pain). 10/09/22   Saguier, Ramon Dredge, PA-C  HYDROcodone-acetaminophen [facility]) 5-325 MG tablet 1 tab po q 6 hours prn severe pain 07/12/23   Saguier, Ramon Dredge, PA-C  Iron, Ferrous Sulfate, 325 (65 Fe) MG TABS 1 tab po bid Patient taking differently: Take 1 tablet by mouth in the morning and at bedtime. 1 tab po bid 11/09/22   Saguier, Ramon Dredge, PA-C  meloxicam (MOBIC) 7.5 MG tablet Take 1 tablet (7.5 mg total) by mouth daily. Patient taking differently: Take 7.5 mg by mouth daily as needed for pain. 12/17/22   Raulkar, Drema Pry, MD  modafinil (PROVIGIL) 200 MG tablet Take one po qAM and 1/2 po qPM Patient taking differently: Take 250 mg by mouth daily. One and one-half tablets by mouth before noon. 05/20/23   Sater, Pearletha Furl, MD  Multiple Vitamin (MULTIVITAMIN WITH MINERALS) TABS tablet Take 1 tablet by mouth daily.    [provider]  nystatin cream (MYCOSTATIN) Apply 1 Application topically 2 (two) times daily. Patient taking differently: Apply 1 Application topically 2 (two) times daily as needed for dry skin. 10/11/22   Saguier, Ramon Dredge, PA-C  ocrelizumab (OCREVUS) 300 MG/10ML injection INFUSE 600MG  INTRAVENOUSLY AS DIRECTED EVERY 6 MONTHS. Patient taking differently: Inject 600 mg into the vein every 6 (six) months. 04/16/23   Sater, Pearletha Furl, MD  olmesartan (BENICAR) 5 MG tablet 2 tab po q day Patient taking differently: Take 10 mg by mouth daily. 2 tab po q day 11/05/22   Saguier, Ramon Dredge, PA-C  ondansetron (ZOFRAN) 4 MG tablet Take 1 tablet (4 mg total) by mouth every 6 (six) hours. 07/10/23   Durwin Glaze, MD  oxybutynin (DITROPAN) 5 MG tablet Take 1 tablet (5 mg total) by mouth 2 (two) times daily. Patient taking differently: Take 5 mg by mouth once as needed for bladder spasms. 05/01/22   Sater, Pearletha Furl, MD  pantoprazole (PROTONIX) 40 MG tablet Take 1 tablet (40 mg total) by mouth daily. 07/10/23   Durwin Glaze, MD  potassium chloride SA  (KLOR-CON M) 20 MEQ tablet Take 1 tablet (20 mEq total) by mouth daily. 06/25/23   Georgeanna Lea, MD  Probiotic Product (PROBIOTIC DAILY PO) Take 1 tablet by mouth 3 (three) times a week. 1 daily    [provider]  traMADol (ULTRAM) 50 MG tablet 1 tab po bid prn severe pain 07/05/23   Saguier, Ramon Dredge, PA-C      Allergies    Ibuprofen and Nsaids    Review of Systems   Review of Systems  Cardiovascular:  Negative for syncope.  Neurological:  Positive for dizziness and weakness.  All other systems reviewed and are negative.   Physical Exam Updated Vital Signs BP 126/85   Pulse (!) 50   Temp 97.6 F (36.4 C) (Axillary)   Resp 18   Ht 5\' 5"  (1.651 m)   Wt 72.6 kg   LMP 08/13/2014  Comment: very few periods  SpO2 100%   BMI 26.63 kg/m  Physical Exam Vitals and nursing note reviewed.  Constitutional:      Appearance: She is well-developed.  HENT:     Head: Normocephalic.     Nose: Nose normal.     Mouth/Throat:     Mouth: Mucous membranes are moist.  Eyes:     Extraocular Movements: Extraocular movements intact.     Pupils: Pupils are equal, round, and reactive to light.  Cardiovascular:     Rate and Rhythm: Normal rate.  Pulmonary:     Effort: Pulmonary effort is normal.  Abdominal:     General: There is no distension.  Musculoskeletal:        General: Normal range of motion.     Cervical back: Normal range of motion.  Skin:    General: Skin is warm.  Neurological:     General: No focal deficit present.     Mental Status: She is alert and oriented to person, place, and time.  Psychiatric:        Mood and Affect: Mood normal.     ED Results / Procedures / Treatments   Labs (all labs ordered are listed, but only abnormal results are displayed) Labs Reviewed  CBC - Abnormal; Notable for the following components:      Result Value   RBC 3.69 (*)    MCV 100.8 (*)    All other components within normal limits  COMPREHENSIVE METABOLIC PANEL -  Abnormal; Notable for the following components:   BUN 35 (*)    Creatinine, Ser 1.58 (*)    GFR, Estimated 37 (*)    All other components within normal limits  RESP PANEL BY RT-PCR (RSV, FLU A&B, COVID)  [id number]    EKG None  Radiology MR Cervical Spine W and Wo Contrast  Result Date: 07/17/2023 CLINICAL DATA:  Multiple sclerosis. EXAM: MRI CERVICAL SPINE WITHOUT AND WITH CONTRAST TECHNIQUE: Multiplanar and multiecho pulse sequences of the cervical spine, to include the craniocervical junction and cervicothoracic junction, were obtained without and with intravenous contrast. CONTRAST:  7mL GADAVIST GADOBUTROL 1 MMOL/ML IV SOLN COMPARISON:  MRI of the cervical spine July 22, 2021. FINDINGS: Alignment: Small anterolisthesis at C3-4, C4-5, C7-T1 and T1-2. Vertebrae: No fracture, evidence of discitis, or bone lesion. Cord: T2 hyperintense lesions within the cord centrally at the level of C2, on the right side at the level of C3-4, in the posterior column at the level of C4-5, left posterolateral at the level of C5 and centrally at the level of C5-6. The lesion at the level of C5 on the left side is new since prior MRI. No enhancing lesion identified. Posterior Fossa, vertebral arteries, paraspinal tissues: A 1.4 cm right thyroid lobe nodule. No follow-up imaging is recommended. Disc levels: C1-2: Bilateral joint effusion. C2-3: Facet degenerative changes resulting in mild left neural foraminal narrowing. No spinal canal stenosis. C3-4: Uncovertebral and facet degenerative changes resulting mild right neural foraminal narrowing. No spinal canal stenosis. C4-5: Mild disc bulge, uncovertebral and facet degenerative changes resulting in moderate right neural foraminal narrowing. No significant spinal canal stenosis. C5-6: Disc bulge with superimposed small left central disc protrusion, uncovertebral and facet degenerative changes. Findings result in moderate right and severe left neural foraminal narrowing.  No significant spinal canal stenosis. C6-7: Small right paracentral disc protrusion, uncovertebral and facet degenerative changes. No significant spinal canal or neural foraminal stenosis. C7-T1: Small right paracentral disc protrusion, uncovertebral and facet degenerative  changes resulting in mild bilateral neural foraminal narrowing. IMPRESSION: 1. Multiple T2 hyperintense lesions within the cervical spinal cord, consistent with the clinical diagnosis of multiple sclerosis. The lesion at the level of C5 on the left side is new since prior MRI. No enhancing lesion identified. 2. Degenerative changes of the cervical spine with moderate right and severe left neural foraminal narrowing at C5-6. Electronically Signed   By: Baldemar Lenis M.D.   On: 07/17/2023 10:44   MR Brain W and Wo Contrast  Result Date: 07/17/2023 CLINICAL DATA:  Neuro deficit, acute, stroke suspected. Multiple sclerosis. EXAM: MRI HEAD WITHOUT AND WITH CONTRAST TECHNIQUE: Multiplanar, multiecho pulse sequences of the brain and surrounding structures were obtained without and with intravenous contrast. CONTRAST:  7mL GADAVIST GADOBUTROL 1 MMOL/ML IV SOLN COMPARISON:  MRI of the brain August 04, 2017. FINDINGS: Brain: No acute infarction, hemorrhage, hydrocephalus, extra-axial collection or mass lesion. No focus of abnormal contrast enhancement identified. Scattered and confluent foci of T2 hyperintensity are seen within the white matter of the cerebral hemispheres including deep, juxta cortical periventricular as well as corpus callosum, progressed when compared to prior MRI. Multiple faint hyperintensity are seen within the pons and middle cerebellar peduncles on axial T2 images, not well seen in the other sequences. 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 Areas of encephalomalacia and gliosis in the lateral left temporal lobe and right frontal lobe suggestive of chronic infarcts. No focus of abnormal contrast  enhancement identified. Vascular: Normal flow voids. Skull and upper cervical spine: Normal marrow signal. Sinuses/Orbits: Small fluid level within the left maxillary sinus. Bilateral lens surgery. Bilateral mastoid effusion. Other: None. IMPRESSION: 1. No acute intracranial abnormality. 2. Progression of chronic white matter disease consistent with the clinical diagnosis of multiple sclerosis. No enhancing lesion identified. 3. Areas of encephalomalacia and gliosis in the lateral left temporal lobe and right frontal lobe suggestive of chronic infarcts. 4. 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 5. Small fluid level within the left maxillary sinus. Correlate for acute sinusitis. 6. Bilateral mastoid effusion. Electronically Signed   By: Baldemar Lenis M.D.   On: 07/17/2023 10:08   DG Chest 2 View  Result Date: 07/17/2023 CLINICAL DATA:  Cough, hemoptysis EXAM: CHEST - 2 VIEW COMPARISON:  07/10/2023 FINDINGS: Lungs are clear. No pneumothorax or pleural effusion. Small hiatal hernia. Cardiac size within normal limits. Pulmonary vascularity is normal. Multiple healed right rib fractures noted. Stable L1 compression deformity IMPRESSION: 1. No active cardiopulmonary disease. 2. Small hiatal hernia. Electronically Signed   By: Helyn Numbers M.D.   On: 07/17/2023 04:02    Procedures Procedures    Medications Ordered in ED Medications  LORazepam (ATIVAN) injection 1 mg (1 mg Intravenous Given 07/17/23 0759)  gadobutrol (GADAVIST) 1 MMOL/ML injection 7 mL (7 mLs Intravenous Contrast Given 07/17/23 0907)    ED Course/ Medical Decision Making/ A&P                                 Medical Decision Making Pt complains of having double vision on and off for the past 2 days.  Patient reports when she looks to the side she feels like she is seeing double.  Patient reports that when she is holding onto things they drop out of her hand.  Patient reports she is able to grip but then  things drop  Amount and/or Complexity of Data Reviewed  Labs: ordered. Decision-making details documented in ED Course.    Details: Labs ordered reviewed and interpreted, hemoglobin is 12.5 UA is negative Radiology: ordered and independent interpretation performed. Decision-making details documented in ED Course.    Details: MRI brain and MRI cervical spine obtained.Marland Kitchen MRI brain and cervical spine show multiple MS lesions. Discussion of management or test interpretation with external provider(s): I discussed the patient with Dr. Jerrell Belfast neurology on-call.  He reviewed patient's treatment he advised patient should follow-up outpatient with her neurologist at [facility] neurology.  He does not feel patient would benefit from steroids.  Risk Prescription drug management.           Final Clinical Impression(s) / ED Diagnoses Final diagnoses:  MS (multiple sclerosis) (HCC)  Hand weakness    Rx / DC Orders ED Discharge Orders     None      An After Visit Summary was printed and given to the patient.    Elson Areas, [place] 07/17/23 1440    Vanetta Mulders, MD 07/17/23 1531

## 2023-07-17 NOTE — ED Triage Notes (Addendum)
Pt arrives to ED c/o dizziness and bilateral arm twitching x 2 days. PT states that she has intermittent "tick" with hands throughout day with occasional weakness. Pt also endorses cough with pink sputum. Pt denies HA, vision changes, SOB or CP

## 2023-07-22 ENCOUNTER — Other Ambulatory Visit: Payer: Self-pay | Admitting: Physical Medicine and Rehabilitation

## 2023-07-22 ENCOUNTER — Telehealth: Payer: Self-pay | Admitting: Neurology

## 2023-07-22 NOTE — Telephone Encounter (Signed)
The patient never scheduled the MRI that Dr. Epimenio Foot ordered in September, but on November 27 she was to the ED and she had a MRI brain and cervical done. She went to the ED because she was having a MS exasperation very bad. She is confused because they told her they were not able to finish the MRI, but there are results for both in her chart. She asked if Dr. Epimenio Foot and give her a call to go over the MRI results and talk about what happened and see about coming in for an appointment.

## 2023-07-22 NOTE — Telephone Encounter (Signed)
Dr. Epimenio Foot- can you review results? I am happy to call her back

## 2023-07-22 NOTE — Telephone Encounter (Signed)
Called pt and relayed Dr. Bonnita Hollow message. She verbalized understanding.  Scheduled sooner appt for 08/12/23 at 9am. Cx April 2025 appt.

## 2023-07-23 ENCOUNTER — Ambulatory Visit (HOSPITAL_BASED_OUTPATIENT_CLINIC_OR_DEPARTMENT_OTHER)
Admission: RE | Admit: 2023-07-23 | Discharge: 2023-07-23 | Disposition: A | Discharge status: Home / Self Care | Payer: Federal, State, Local not specified - PPO | Source: Ambulatory Visit | Attending: Cardiology | Admitting: Cardiology

## 2023-07-23 ENCOUNTER — Encounter
Payer: Federal, State, Local not specified - PPO | Attending: Physical Medicine and Rehabilitation | Admitting: Physical Medicine and Rehabilitation

## 2023-07-23 DIAGNOSIS — R0609 Other forms of dyspnea: Secondary | ICD-10-CM | POA: Insufficient documentation

## 2023-07-23 DIAGNOSIS — Z79899 Other long term (current) drug therapy: Secondary | ICD-10-CM | POA: Insufficient documentation

## 2023-07-23 LAB — ECHOCARDIOGRAM COMPLETE
AR max vel: 2.37 cm2
AV Area VTI: 2 cm2
AV Area mean vel: 2.19 cm2
AV Mean grad: 4 mm[Hg]
AV Peak grad: 6 mm[Hg]
Ao pk vel: 1.22 m/s
Area-P 1/2: 1.98 cm2
Calc EF: 74.5 %
MV VTI: 2.48 cm2
P 1/2 time: 1723 ms
S' Lateral: 2.3 cm
Single Plane A2C EF: 77.3 %
Single Plane A4C EF: 69.2 %

## 2023-07-23 NOTE — Progress Notes (Signed)
Left message to discuss possible side effects of Meloxicam

## 2023-07-25 ENCOUNTER — Telehealth: Payer: Self-pay

## 2023-07-25 ENCOUNTER — Other Ambulatory Visit: Payer: Self-pay | Admitting: Neurology

## 2023-07-25 ENCOUNTER — Encounter: Payer: Federal, State, Local not specified - PPO | Admitting: Physical Medicine and Rehabilitation

## 2023-07-25 NOTE — Telephone Encounter (Signed)
Catherine Hubbard called to report that she does not take the Mobic anymore. She rarely takes ibuprofen.   Call back phone [phone].

## 2023-07-25 NOTE — Telephone Encounter (Signed)
Last seen on 05/20/23 Follow up scheduled on 08/12/23

## 2023-07-25 NOTE — Telephone Encounter (Signed)
Left message on My Chart with normal Echo results per Dr. Krasowski's note. Routed to PCP.  

## 2023-07-26 ENCOUNTER — Inpatient Hospital Stay: Payer: Federal, State, Local not specified - PPO | Admitting: Hematology & Oncology

## 2023-07-26 ENCOUNTER — Inpatient Hospital Stay: Payer: Federal, State, Local not specified - PPO | Attending: Family

## 2023-07-29 ENCOUNTER — Other Ambulatory Visit: Payer: Self-pay | Admitting: Medical

## 2023-07-30 ENCOUNTER — Telehealth: Payer: Self-pay | Admitting: Medical

## 2023-07-30 MED ORDER — ALBUTEROL SULFATE (2.5 MG/3ML) 0.083% IN NEBU: 2.5000 mg | INHALATION_SOLUTION | Freq: Four times a day (QID) | RESPIRATORY_TRACT | 0 refills | Status: DC | PRN

## 2023-07-30 MED ORDER — ALBUTEROL SULFATE HFA 108 (90 BASE) MCG/ACT IN AERS: 2.0000 | INHALATION_SPRAY | Freq: Four times a day (QID) | RESPIRATORY_TRACT | 1 refills | Status: DC | PRN

## 2023-07-30 NOTE — Telephone Encounter (Signed)
Rx refill sent to pharmacy. 

## 2023-07-30 NOTE — Telephone Encounter (Signed)
Pt called back to say that she needs her albuterol (PROVENTIL) (2.5 MG/3ML) 0.083% nebulizer solution [[id number]]. Please send to [facility] on [address].

## 2023-07-30 NOTE — Addendum Note (Signed)
Addended by: Gwenevere Abbot on: 07/30/2023 05:07 PM   Modules accepted: Orders

## 2023-07-31 ENCOUNTER — Ambulatory Visit (HOSPITAL_BASED_OUTPATIENT_CLINIC_OR_DEPARTMENT_OTHER): Payer: Federal, State, Local not specified - PPO

## 2023-08-03 ENCOUNTER — Encounter (HOSPITAL_COMMUNITY): Payer: Self-pay

## 2023-08-03 ENCOUNTER — Emergency Department (HOSPITAL_COMMUNITY): Payer: Federal, State, Local not specified - PPO

## 2023-08-03 ENCOUNTER — Emergency Department (HOSPITAL_COMMUNITY)
Admission: EM | Admit: 2023-08-03 | Discharge: 2023-08-03 | Disposition: A | Discharge status: Home / Self Care | Payer: Federal, State, Local not specified - PPO | Attending: Emergency Medicine | Admitting: Emergency Medicine

## 2023-08-03 ENCOUNTER — Other Ambulatory Visit: Payer: Self-pay

## 2023-08-03 DIAGNOSIS — R059 Cough, unspecified: Secondary | ICD-10-CM | POA: Diagnosis not present

## 2023-08-03 DIAGNOSIS — I959 Hypotension, unspecified: Secondary | ICD-10-CM | POA: Diagnosis not present

## 2023-08-03 DIAGNOSIS — N390 Urinary tract infection, site not specified: Secondary | ICD-10-CM

## 2023-08-03 DIAGNOSIS — R42 Dizziness and giddiness: Secondary | ICD-10-CM | POA: Insufficient documentation

## 2023-08-03 DIAGNOSIS — H43399 Other vitreous opacities, unspecified eye: Secondary | ICD-10-CM | POA: Insufficient documentation

## 2023-08-03 DIAGNOSIS — R0602 Shortness of breath: Secondary | ICD-10-CM | POA: Diagnosis not present

## 2023-08-03 DIAGNOSIS — I6782 Cerebral ischemia: Secondary | ICD-10-CM | POA: Diagnosis not present

## 2023-08-03 DIAGNOSIS — I771 Stricture of artery: Secondary | ICD-10-CM | POA: Diagnosis not present

## 2023-08-03 DIAGNOSIS — R000 Tachycardia, unspecified: Secondary | ICD-10-CM | POA: Diagnosis not present

## 2023-08-03 DIAGNOSIS — G9389 Other specified disorders of brain: Secondary | ICD-10-CM | POA: Diagnosis not present

## 2023-08-03 DIAGNOSIS — R4182 Altered mental status, unspecified: Secondary | ICD-10-CM | POA: Diagnosis not present

## 2023-08-03 DIAGNOSIS — R442 Other hallucinations: Secondary | ICD-10-CM | POA: Diagnosis not present

## 2023-08-03 DIAGNOSIS — R0689 Other abnormalities of breathing: Secondary | ICD-10-CM | POA: Diagnosis not present

## 2023-08-03 LAB — COMPREHENSIVE METABOLIC PANEL
ALT: 12 U/L (ref 0–44)
AST: 24 U/L (ref 15–41)
Albumin: 3.5 g/dL (ref 3.5–5.0)
Alkaline Phosphatase: 62 U/L (ref 38–126)
Anion gap: 9 (ref 5–15)
BUN: 27 mg/dL — ABNORMAL HIGH (ref 8–23)
CO2: 26 mmol/L (ref 22–32)
Calcium: 9.6 mg/dL (ref 8.9–10.3)
Chloride: 102 mmol/L (ref 98–111)
Creatinine, Ser: 1.15 mg/dL — ABNORMAL HIGH (ref 0.44–1.00)
GFR, Estimated: 54 mL/min — ABNORMAL LOW (ref >60)
Glucose, Bld: 94 mg/dL (ref 70–99)
Potassium: 3.9 mmol/L (ref 3.5–5.1)
Sodium: 137 mmol/L (ref 135–145)
Total Bilirubin: 1.4 mg/dL — ABNORMAL HIGH (ref <1.2)
Total Protein: 6.1 g/dL — ABNORMAL LOW (ref 6.5–8.1)

## 2023-08-03 LAB — I-STAT VENOUS BLOOD GAS, ED
Acid-Base Excess: 1 mmol/L (ref 0.0–2.0)
Bicarbonate: 27.6 mmol/L (ref 20.0–28.0)
Calcium, Ion: 1.23 mmol/L (ref 1.15–1.40)
HCT: 32 % — ABNORMAL LOW (ref 36.0–46.0)
Hemoglobin: 10.9 g/dL — ABNORMAL LOW (ref 12.0–15.0)
O2 Saturation: 95 %
Potassium: 3.9 mmol/L (ref 3.5–5.1)
Sodium: 139 mmol/L (ref 135–145)
TCO2: 29 mmol/L (ref 22–32)
pCO2, Ven: 51.4 mm[Hg] (ref 44–60)
pH, Ven: 7.337 (ref 7.25–7.43)
pO2, Ven: 81 mm[Hg] — ABNORMAL HIGH (ref 32–45)

## 2023-08-03 LAB — URINALYSIS, ROUTINE W REFLEX MICROSCOPIC
Bilirubin Urine: NEGATIVE
Glucose, UA: NEGATIVE mg/dL
Hgb urine dipstick: NEGATIVE
Ketones, ur: NEGATIVE mg/dL
Nitrite: POSITIVE — AB
Protein, ur: NEGATIVE mg/dL
Specific Gravity, Urine: 1.012 (ref 1.005–1.030)
pH: 5 (ref 5.0–8.0)

## 2023-08-03 LAB — CBC WITH DIFFERENTIAL/PLATELET
Abs Immature Granulocytes: 0.07 10*3/uL (ref 0.00–0.07)
Basophils Absolute: 0.1 10*3/uL (ref 0.0–0.1)
Basophils Relative: 1 %
Eosinophils Absolute: 0.2 10*3/uL (ref 0.0–0.5)
Eosinophils Relative: 2 %
HCT: 35.4 % — ABNORMAL LOW (ref 36.0–46.0)
Hemoglobin: 12 g/dL (ref 12.0–15.0)
Immature Granulocytes: 1 %
Lymphocytes Relative: 4 %
Lymphs Abs: 0.6 10*3/uL — ABNORMAL LOW (ref 0.7–4.0)
MCH: 33.9 pg (ref 26.0–34.0)
MCHC: 33.9 g/dL (ref 30.0–36.0)
MCV: 100 fL (ref 80.0–100.0)
Monocytes Absolute: 0.9 10*3/uL (ref 0.1–1.0)
Monocytes Relative: 7 %
Neutro Abs: 12.3 10*3/uL — ABNORMAL HIGH (ref 1.7–7.7)
Neutrophils Relative %: 85 %
Platelets: 254 10*3/uL (ref 150–400)
RBC: 3.54 MIL/uL — ABNORMAL LOW (ref 3.87–5.11)
RDW: 11.7 % (ref 11.5–15.5)
WBC: 14.2 10*3/uL — ABNORMAL HIGH (ref 4.0–10.5)
nRBC: 0 % (ref 0.0–0.2)

## 2023-08-03 LAB — RAPID URINE DRUG SCREEN, HOSP PERFORMED
Amphetamines: NOT DETECTED
Barbiturates: NOT DETECTED
Benzodiazepines: NOT DETECTED
Cocaine: NOT DETECTED
Opiates: NOT DETECTED
Tetrahydrocannabinol: NOT DETECTED

## 2023-08-03 LAB — ETHANOL: Alcohol, Ethyl (B): <10 mg/dL (ref <10)

## 2023-08-03 MED ORDER — CEPHALEXIN 500 MG PO CAPS: 500.0000 mg | ORAL_CAPSULE | Freq: Four times a day (QID) | ORAL | 0 refills | Status: DC

## 2023-08-03 MED ORDER — SODIUM CHLORIDE 0.9 % IV BOLUS
1000.0000 mL | Freq: Once | INTRAVENOUS | Status: AC
  Administered 2023-08-03: 1000 mL via INTRAVENOUS

## 2023-08-03 MED ORDER — CEPHALEXIN 250 MG PO CAPS
500.0000 mg | ORAL_CAPSULE | Freq: Once | ORAL | Status: AC
  Administered 2023-08-03: 500 mg via ORAL
  Filled 2023-08-03: qty 2

## 2023-08-03 NOTE — ED Notes (Signed)
Went and spoke to [place], [place] about Pt's BP. Labs and fluid as noted.  Called lab and added on to labs already sent down.

## 2023-08-03 NOTE — ED Notes (Signed)
PT was given a warm blanket.

## 2023-08-03 NOTE — ED Provider Notes (Signed)
[place] EMERGENCY DEPARTMENT AT [facility] Provider Note   CSN: [id number] Arrival date & time: 08/03/23  1214     History  Chief Complaint  Patient presents with   Shortness of Breath   Visual Field Change    Catherine Hubbard is a 62 y.o. female.  HPI   62 year old female presents to the emergency department with multiple complaints.  She is a difficult and transient historian.  Apparently she originally came here after having shortness of breath and intermittently productive cough, this appears to have been going on over the past week.  Patient also complains of multiple floaters in the eyes. She sometimes feels off balance when walking, especially when changing positions. It is mention by the husband that she is a daily alcohol drinker, she admits to intermittent alcohol use, denies any today.  No other drug use.  She has no headache, chest pain, acute neurosymptoms.  Home Medications Prior to Admission medications   Medication Sig Start Date End Date Taking? Authorizing Provider  acetaminophen (TYLENOL) 500 MG tablet Take 2 tablets (1,000 mg total) by mouth every 6 (six) hours as needed for mild pain (pain score 1-3). 06/20/23   Meuth, Brooke A, PA-C  albuterol (PROVENTIL) (2.5 MG/3ML) 0.083% nebulizer solution Take 3 mLs (2.5 mg total) by nebulization every 6 (six) hours as needed for wheezing or shortness of breath. 07/30/23   Saguier, Ramon Dredge, PA-C  albuterol (VENTOLIN HFA) 108 (90 Base) MCG/ACT inhaler Inhale 2 puffs into the lungs every 6 (six) hours as needed for wheezing or shortness of breath. 07/30/23   Saguier, Ramon Dredge, PA-C  amitriptyline (ELAVIL) 10 MG tablet TAKE 1 TABLET BY MOUTH EVERYDAY AT BEDTIME Patient taking differently: Take 10 mg by mouth at bedtime as needed for sleep. TAKE 1 TABLET BY MOUTH EVERYDAY AT BEDTIME 03/25/23   Raulkar, Drema Pry, MD  Ascorbic Acid (VITAMIN C PO) Take 1 tablet by mouth daily. 1 tablet daily    [provider]  B Complex Vitamins (VITAMIN B COMPLEX PO) Take 1 tablet by mouth daily. 1 daily    [provider]  baclofen (LIORESAL) 10 MG tablet TAKE 1 TABLET BY MOUTH THREE TIMES A DAY AS NEEDED FOR MUSCLE SPASMS 07/25/23   Sater, Pearletha Furl, MD  benzonatate (TESSALON) 100 MG capsule Take 1 capsule (100 mg total) by mouth 3 (three) times daily as needed for cough. 07/05/23   Saguier, Ramon Dredge, PA-C  bismuth subsalicylate (PEPTO BISMOL) 262 MG/15ML suspension Take 30 mLs by mouth daily as needed for indigestion or diarrhea or loose stools (acid reflux).    [provider]  budesonide-formoterol (SYMBICORT) 80-4.5 MCG/ACT inhaler TAKE 2 PUFFS BY MOUTH TWICE A DAY 07/29/23   Saguier, Ramon Dredge, PA-C  Calcium Carbonate Antacid (TUMS PO) Take 2 tablets by mouth 2 (two) times daily as needed (acid reflux).    [provider]  Cholecalciferol (D2000 ULTRA STRENGTH) 50 MCG (2000 UT) CAPS Take 2,000 Units by mouth daily.    [provider]  diclofenac Sodium (VOLTAREN) 1 % GEL Apply 4 g topically 4 (four) times daily. Patient taking differently: Apply 4 g topically 4 (four) times daily as needed (joint pain). 06/21/22   Raulkar, Drema Pry, MD  fluticasone (FLONASE) 50 MCG/ACT nasal spray SPRAY 2 SPRAYS INTO EACH NOSTRIL EVERY DAY Patient taking differently: Place 2 sprays into both nostrils once as needed for allergies. 10/23/22   Saguier, Ramon Dredge, PA-C  fluticasone (FLONASE) 50 MCG/ACT nasal spray Place 2 sprays into  both nostrils daily. 07/05/23   Saguier, Ramon Dredge, PA-C  furosemide (LASIX) 40 MG tablet Take 1 tablet (40 mg total) by mouth daily. 06/25/23 09/23/23  Georgeanna Lea, MD  gabapentin (NEURONTIN) 100 MG capsule TAKE 1 CAPSULE BY MOUTH THREE TIMES A DAY Patient taking differently: Take 100 mg by mouth 3 (three) times daily as needed (nerve pain). 10/09/22   Saguier, Ramon Dredge, PA-C  HYDROcodone-acetaminophen [facility]) 5-325 MG tablet 1 tab po q 6 hours prn severe pain 07/12/23    Saguier, Ramon Dredge, PA-C  Iron, Ferrous Sulfate, 325 (65 Fe) MG TABS 1 tab po bid Patient taking differently: Take 1 tablet by mouth in the morning and at bedtime. 1 tab po bid 11/09/22   Saguier, Ramon Dredge, PA-C  meloxicam (MOBIC) 7.5 MG tablet Take 1 tablet (7.5 mg total) by mouth daily as needed for pain. 07/23/23   Raulkar, Drema Pry, MD  modafinil (PROVIGIL) 200 MG tablet Take one po qAM and 1/2 po qPM Patient taking differently: Take 250 mg by mouth daily. One and one-half tablets by mouth before noon. 05/20/23   Sater, Pearletha Furl, MD  Multiple Vitamin (MULTIVITAMIN WITH MINERALS) TABS tablet Take 1 tablet by mouth daily.    [provider]  nystatin cream (MYCOSTATIN) Apply 1 Application topically 2 (two) times daily. Patient taking differently: Apply 1 Application topically 2 (two) times daily as needed for dry skin. 10/11/22   Saguier, Ramon Dredge, PA-C  ocrelizumab (OCREVUS) 300 MG/10ML injection INFUSE 600MG  INTRAVENOUSLY AS DIRECTED EVERY 6 MONTHS. Patient taking differently: Inject 600 mg into the vein every 6 (six) months. 04/16/23   Sater, Pearletha Furl, MD  olmesartan (BENICAR) 5 MG tablet 2 tab po q day Patient taking differently: Take 10 mg by mouth daily. 2 tab po q day 11/05/22   Saguier, Ramon Dredge, PA-C  ondansetron (ZOFRAN) 4 MG tablet Take 1 tablet (4 mg total) by mouth every 6 (six) hours. 07/10/23   Durwin Glaze, MD  oxybutynin (DITROPAN) 5 MG tablet TAKE 1 TABLET BY MOUTH TWICE A DAY 07/25/23   Sater, Pearletha Furl, MD  pantoprazole (PROTONIX) 40 MG tablet Take 1 tablet (40 mg total) by mouth daily. 07/10/23   Durwin Glaze, MD  potassium chloride SA (KLOR-CON M) 20 MEQ tablet Take 1 tablet (20 mEq total) by mouth daily. 06/25/23   Georgeanna Lea, MD  Probiotic Product (PROBIOTIC DAILY PO) Take 1 tablet by mouth 3 (three) times a week. 1 daily    [provider]  traMADol (ULTRAM) 50 MG tablet 1 tab po bid prn severe pain 07/05/23   Saguier, Ramon Dredge, PA-C      Allergies     Ibuprofen and Nsaids    Review of Systems   Review of Systems  Constitutional:  Negative for fever.  Respiratory:  Positive for cough and shortness of breath.   Cardiovascular:  Negative for chest pain.  Gastrointestinal:  Negative for abdominal pain, diarrhea and vomiting.  Skin:  Negative for rash.  Neurological:  Positive for light-headedness. Negative for headaches.    Physical Exam Updated Vital Signs BP 105/75   Pulse 71   Temp 98.4 F (36.9 C) (Oral)   Resp (!) 24   LMP 08/13/2014 Comment: very few periods  SpO2 99%  Physical Exam Vitals and nursing note reviewed.  Constitutional:      General: She is not in acute distress.    Appearance: Normal appearance.  HENT:     Head: Normocephalic.     Mouth/Throat:  Mouth: Mucous membranes are moist.  Eyes:     Pupils: Pupils are equal, round, and reactive to light.  Cardiovascular:     Rate and Rhythm: Normal rate.  Pulmonary:     Effort: Pulmonary effort is normal. No respiratory distress.     Breath sounds: Examination of the right-lower field reveals decreased breath sounds. Examination of the left-lower field reveals decreased breath sounds. Decreased breath sounds present.  Abdominal:     Palpations: Abdomen is soft.     Tenderness: There is no abdominal tenderness.  Musculoskeletal:     Right lower leg: No edema.     Left lower leg: No edema.  Skin:    General: Skin is warm.  Neurological:     Mental Status: She is alert and oriented to person, place, and time. Mental status is at baseline.  Psychiatric:        Mood and Affect: Mood normal.     ED Results / Procedures / Treatments   Labs (all labs ordered are listed, but only abnormal results are displayed) Labs Reviewed  CBC WITH DIFFERENTIAL/PLATELET - Abnormal; Notable for the following components:      Result Value   WBC 14.2 (*)    RBC 3.54 (*)    HCT 35.4 (*)    Neutro Abs 12.3 (*)    Lymphs Abs 0.6 (*)    All other components within  normal limits  COMPREHENSIVE METABOLIC PANEL - Abnormal; Notable for the following components:   BUN 27 (*)    Creatinine, Ser 1.15 (*)    Total Protein 6.1 (*)    Total Bilirubin 1.4 (*)    GFR, Estimated 54 (*)    All other components within normal limits  ETHANOL  URINALYSIS, ROUTINE W REFLEX MICROSCOPIC  I-STAT VENOUS BLOOD GAS, ED    EKG EKG Interpretation Date/Time:  Saturday August 03 2023 12:25:59 EST Ventricular Rate:  81 PR Interval:  170 QRS Duration:  88 QT Interval:  386 QTC Calculation: 448 R Axis:   -57  Text Interpretation: Sinus rhythm LAD, consider left anterior fascicular block Similar to previous Confirmed by Coralee Pesa [phone]) on 08/03/2023 2:24:35 PM  Radiology No results found.  Procedures Procedures    Medications Ordered in ED Medications  sodium chloride 0.9 % bolus 1,000 mL (1,000 mLs Intravenous New Bag/Given 08/03/23 1356)    ED Course/ Medical Decision Making/ A&P                                 Medical Decision Making Amount and/or Complexity of Data Reviewed Labs: ordered. Radiology: ordered.   62 year old female presents emergency department multiple complaints.  The main 1 being intermittent shortness of breath and productive cough as well as sometimes feeling lightheaded/dizzy with position changes.  Report from EMS is that she was acting odd and erratic on their arrival.  Possible history of daily alcohol abuse.  Vitals are stable on my evaluation, lung sounds are diminished at bases but clear.  She is a difficult/transient historian.  Hard to stay focused on a single complaint.  Will pursue a general workup and reevaluate.  Patient pending results and re eval.        Final Clinical Impression(s) / ED Diagnoses Final diagnoses:  None    Rx / DC Orders ED Discharge Orders     None         Rozelle Logan, DO 08/03/23 1529

## 2023-08-03 NOTE — ED Triage Notes (Signed)
Pt from home with ems c.o sob and visual changes. Per EMS report pt was doing a home neb treatment walking around her house acting very odd/erratic when EMS arrived to her house. Pt also c.o seeing white spots in her vision. Pt denies daily alcohol use but pts husband told EMS she drinks daily but he does not know how much because the pt hides it. Pt was hypotensive with EMS initially SBP 70s, last BP 103/67. Pt arrives to ED alert, answers questions appropriately.

## 2023-08-03 NOTE — ED Notes (Signed)
Called lab to add on ethanol

## 2023-08-03 NOTE — ED Notes (Signed)
New IV started as the old IV was not flowing.

## 2023-08-03 NOTE — Discharge Instructions (Signed)
Return for any problem.  ?

## 2023-08-03 NOTE — ED Provider Notes (Signed)
Patient seen after prior ED provider.  Workup suggest possible UTI.  Will treat.  Patient understands need to complete full course of antibiotics.  Patient feels improved after ED evaluation.  She is comfortable with plan for discharge home.  Importance of close follow-up is stressed.  Strict return precautions given and understood.   Wynetta Fines, MD 08/03/23 [phone]

## 2023-08-05 ENCOUNTER — Telehealth: Payer: Self-pay | Admitting: *Deleted

## 2023-08-05 NOTE — Transitions of Care (Post Inpatient/ED Visit) (Signed)
   08/05/2023  Name: Catherine Hubbard MRN: [id number] DOB: 09-Aug-1961  Today's TOC FU Call Status: Today's TOC FU Call Status:: Unsuccessful Call (1st Attempt) Unsuccessful Call (1st Attempt) Date: 08/05/23  Attempted to reach the patient regarding the most recent Inpatient/ED visit.  Follow Up Plan: Additional outreach attempts will be made to reach the patient to complete the Transitions of Care (Post Inpatient/ED visit) call.   Signature Shloma Roggenkamp, [facility]

## 2023-08-06 ENCOUNTER — Ambulatory Visit: Payer: Federal, State, Local not specified - PPO | Attending: Cardiology | Admitting: Cardiology

## 2023-08-06 ENCOUNTER — Encounter: Payer: Self-pay | Admitting: Cardiology

## 2023-08-06 VITALS — BP 100/70 | HR 75 | Ht 63.0 in | Wt 156.0 lb

## 2023-08-06 DIAGNOSIS — F1010 Alcohol abuse, uncomplicated: Secondary | ICD-10-CM | POA: Diagnosis not present

## 2023-08-06 DIAGNOSIS — G35D Multiple sclerosis, unspecified: Secondary | ICD-10-CM

## 2023-08-06 DIAGNOSIS — G35 Multiple sclerosis: Secondary | ICD-10-CM

## 2023-08-06 DIAGNOSIS — I10 Essential (primary) hypertension: Secondary | ICD-10-CM

## 2023-08-06 DIAGNOSIS — R0609 Other forms of dyspnea: Secondary | ICD-10-CM | POA: Diagnosis not present

## 2023-08-06 NOTE — Progress Notes (Signed)
Cardiology Office Note:    Date:  08/06/2023   ID:  Catherine Hubbard, DOB 12/29/1960, MRN [id number]  PCP:  [name], PA-C  Cardiologist:  Gypsy Balsam, MD    Referring MD: Marisue Brooklyn   Chief Complaint  Patient presents with   Follow-up    History of Present Illness:    Catherine Hubbard is a 62 y.o. female past medical history significant for multiple sclerosis, EtOH abuse, GERD she was referred to [place] because of swelling of lower extremities.  Workup so far included echocardiogram which showed preserved ejection fraction, she did have some diastolic dysfunction, proBNP was elevated.  She was put on small dose of diuretic she said she is doing fine.  However on the physical exam I still see at least 1+ swelling both legs.  Denies have any chest pain tightness squeezing pressure burning chest.  Recently she did have urinary tract infection and was very sick with this but seems to recover completely from it  Past Medical History:  Diagnosis Date   Alcohol abuse    Asthma    Esophagitis    GERD (gastroesophageal reflux disease)    Headache    Hearing loss    Hypertension    Multiple sclerosis (HCC)    Pancreatitis    Vision abnormalities     Past Surgical History:  Procedure Laterality Date   ANKLE FRACTURE SURGERY Bilateral    CARPAL TUNNEL RELEASE Bilateral    CHOLECYSTECTOMY     COLONOSCOPY  11/2016   multiple   ESOPHAGOGASTRODUODENOSCOPY  11/2016   ESOPHAGOGASTRODUODENOSCOPY N/A 02/01/2017   Procedure: ESOPHAGOGASTRODUODENOSCOPY (EGD);  Surgeon: Iva Boop, MD;  Location: [facility] ENDOSCOPY;  Service: Endoscopy;  Laterality: N/A;   ESOPHAGOGASTRODUODENOSCOPY (EGD) WITH PROPOFOL N/A 02/04/2017   Procedure: ESOPHAGOGASTRODUODENOSCOPY (EGD) WITH PROPOFOL;  Surgeon: Meryl Dare, MD;  Location: [facility] ENDOSCOPY;  Service: Endoscopy;  Laterality: N/A;   LAPAROSCOPIC GASTRIC SLEEVE RESECTION  2011   ULNAR NERVE REPAIR Bilateral     Current  Medications: Current Meds  Medication Sig   acetaminophen (TYLENOL) 500 MG tablet Take 2 tablets (1,000 mg total) by mouth every 6 (six) hours as needed for mild pain (pain score 1-3).   albuterol (PROVENTIL) (2.5 MG/3ML) 0.083% nebulizer solution Take 3 mLs (2.5 mg total) by nebulization every 6 (six) hours as needed for wheezing or shortness of breath.   albuterol (VENTOLIN HFA) 108 (90 Base) MCG/ACT inhaler Inhale 2 puffs into the lungs every 6 (six) hours as needed for wheezing or shortness of breath.   Ascorbic Acid (VITAMIN C PO) Take 1 tablet by mouth daily. 1 tablet daily   B Complex Vitamins (VITAMIN B COMPLEX PO) Take 1 tablet by mouth daily. 1 daily   baclofen (LIORESAL) 10 MG tablet TAKE 1 TABLET BY MOUTH THREE TIMES A DAY AS NEEDED FOR MUSCLE SPASMS (Patient taking differently: Take 10 mg by mouth 3 (three) times daily as needed for muscle spasms.)   benzonatate (TESSALON) 100 MG capsule Take 1 capsule (100 mg total) by mouth 3 (three) times daily as needed for cough.   bismuth subsalicylate (PEPTO BISMOL) 262 MG/15ML suspension Take 30 mLs by mouth daily as needed for indigestion or diarrhea or loose stools (acid reflux).   budesonide-formoterol (SYMBICORT) 80-4.5 MCG/ACT inhaler TAKE 2 PUFFS BY MOUTH TWICE A DAY (Patient taking differently: Inhale 2 puffs into the lungs 2 (two) times daily.)   Calcium Carbonate Antacid (TUMS PO) Take 2 tablets by mouth 2 (two) times daily  as needed (acid reflux).   cephALEXin (KEFLEX) 500 MG capsule Take 1 capsule (500 mg total) by mouth 4 (four) times daily.   Cholecalciferol (D2000 ULTRA STRENGTH) 50 MCG (2000 UT) CAPS Take 2,000 Units by mouth daily.   diclofenac Sodium (VOLTAREN) 1 % GEL Apply 4 g topically 4 (four) times daily. (Patient taking differently: Apply 4 g topically 4 (four) times daily as needed (joint pain).)   fluticasone (FLONASE) 50 MCG/ACT nasal spray SPRAY 2 SPRAYS INTO EACH NOSTRIL EVERY DAY (Patient taking differently: Place 2  sprays into both nostrils once as needed for allergies.)   fluticasone (FLONASE) 50 MCG/ACT nasal spray Place 2 sprays into both nostrils daily.   furosemide (LASIX) 40 MG tablet Take 1 tablet (40 mg total) by mouth daily.   gabapentin (NEURONTIN) 100 MG capsule TAKE 1 CAPSULE BY MOUTH THREE TIMES A DAY (Patient taking differently: Take 100 mg by mouth 3 (three) times daily as needed (nerve pain).)   HYDROcodone-acetaminophen (NORCO) 5-325 MG tablet 1 tab po q 6 hours prn severe pain (Patient taking differently: Take 1 tablet by mouth every 6 (six) hours as needed for moderate pain (pain score 4-6) or severe pain (pain score 7-10). 1 tab po q 6 hours prn severe pain)   Iron, Ferrous Sulfate, 325 (65 Fe) MG TABS 1 tab po bid (Patient taking differently: Take 1 tablet by mouth in the morning and at bedtime. 1 tab po bid)   modafinil (PROVIGIL) 200 MG tablet Take one po qAM and 1/2 po qPM (Patient taking differently: Take 250 mg by mouth daily. One and one-half tablets by mouth before noon.)   Multiple Vitamin (MULTIVITAMIN WITH MINERALS) TABS tablet Take 1 tablet by mouth daily.   nystatin cream (MYCOSTATIN) Apply 1 Application topically 2 (two) times daily. (Patient taking differently: Apply 1 Application topically 2 (two) times daily as needed for dry skin.)   ocrelizumab (OCREVUS) 300 MG/10ML injection INFUSE 600MG  INTRAVENOUSLY AS DIRECTED EVERY 6 MONTHS. (Patient taking differently: Inject 600 mg into the vein every 6 (six) months.)   olmesartan (BENICAR) 5 MG tablet 2 tab po q day (Patient taking differently: Take 10 mg by mouth daily. 2 tab po q day)   ondansetron (ZOFRAN) 4 MG tablet Take 1 tablet (4 mg total) by mouth every 6 (six) hours.   oxybutynin (DITROPAN) 5 MG tablet TAKE 1 TABLET BY MOUTH TWICE A DAY   pantoprazole (PROTONIX) 40 MG tablet Take 1 tablet (40 mg total) by mouth daily.   potassium chloride SA (KLOR-CON M) 20 MEQ tablet Take 1 tablet (20 mEq total) by mouth daily.    Probiotic Product (PROBIOTIC DAILY PO) Take 1 tablet by mouth 3 (three) times a week. 1 daily   traMADol (ULTRAM) 50 MG tablet 1 tab po bid prn severe pain (Patient taking differently: Take 50 mg by mouth 2 (two) times daily as needed for moderate pain (pain score 4-6) or severe pain (pain score 7-10). 1 tab po bid prn severe pain)   [DISCONTINUED] amitriptyline (ELAVIL) 10 MG tablet TAKE 1 TABLET BY MOUTH EVERYDAY AT BEDTIME (Patient taking differently: Take 10 mg by mouth at bedtime as needed for sleep. TAKE 1 TABLET BY MOUTH EVERYDAY AT BEDTIME)   [DISCONTINUED] meloxicam (MOBIC) 7.5 MG tablet Take 1 tablet (7.5 mg total) by mouth daily as needed for pain.   Current Facility-Administered Medications for the 08/06/23 encounter (Office Visit) with Georgeanna Lea, MD  Medication   sodium chloride (PF) 0.9 % injection 2  mL     Allergies:   Ibuprofen and Nsaids   Social History   Socioeconomic History   Marital status: Married    Spouse name: Not on file   Number of children: Not on file   Years of education: Not on file   Highest education level: Not on file  Occupational History   Not on file  Tobacco Use   Smoking status: Never   Smokeless tobacco: Never   Tobacco comments:    second hand smoker when young. Heavy exposure.  Vaping Use   Vaping status: Never Used  Substance and Sexual Activity   Alcohol use: Yes    Alcohol/week: 6.0 standard drinks of alcohol    Types: 6 Cans of beer per week    Comment: daily, couple of shots ( weekly depending on day)   Drug use: No   Sexual activity: Not Currently  Other Topics Concern   Not on file  Social History Narrative   Not on file   Social Drivers of Health   Financial Resource Strain: Not on file  Food Insecurity: No Food Insecurity (06/19/2023)   Hunger Vital Sign    Worried About Running Out of Food in the Last Year: Never true    Ran Out of Food in the Last Year: Never true  Transportation Needs: No Transportation  Needs (06/19/2023)   PRAPARE - Administrator, Civil Service (Medical): No    Lack of Transportation (Non-Medical): No  Physical Activity: Not on file  Stress: Not on file  Social Connections: Not on file     Family History: The patient's family history includes Cancer in her father and mother; Stroke in her mother. ROS:   Please see the history of present illness.    All 14 point review of systems negative except as described per history of present illness  EKGs/Labs/Other Studies Reviewed:         Recent Labs: 07/04/2023: Magnesium 1.7; NT-Pro BNP 448 08/03/2023: ALT 12; BUN 27; Creatinine, Ser 1.15; Hemoglobin 10.9; Platelets 254; Potassium 3.9; Sodium 139  Recent Lipid Panel    Component Value Date/Time   CHOL 135 12/18/2021 1544   TRIG 67.0 12/18/2021 1544   HDL 71.70 12/18/2021 1544   CHOLHDL 2 12/18/2021 1544   VLDL 13.4 12/18/2021 1544   LDLCALC 50 12/18/2021 1544    Physical Exam:    VS:  BP 100/70 (BP Location: Right Arm, Patient Position: Sitting)   Pulse 75   Ht 5\' 3"  (1.6 m)   Wt 156 lb (70.8 kg)   LMP 08/13/2014 Comment: very few periods  SpO2 98%   BMI 27.63 kg/m     Wt Readings from Last 3 Encounters:  08/06/23 156 lb (70.8 kg)  07/17/23 160 lb 0.9 oz (72.6 kg)  07/10/23 160 lb (72.6 kg)     GEN:  Well nourished, well developed in no acute distress HEENT: Normal NECK: No JVD; No carotid bruits LYMPHATICS: No lymphadenopathy CARDIAC: RRR, no murmurs, no rubs, no gallops RESPIRATORY:  Clear to auscultation without rales, wheezing or rhonchi  ABDOMEN: Soft, non-tender, non-distended MUSCULOSKELETAL:  No edema; No deformity  SKIN: Warm and dry LOWER EXTREMITIES: 1+ swelling NEUROLOGIC:  Alert and oriented x 3 PSYCHIATRIC:  Normal affect   ASSESSMENT:    1. Dyspnea on exertion   2. Essential hypertension   3. Multiple sclerosis (HCC)   4. Alcohol abuse    PLAN:    In order of problems listed above:  Swelling  of lower  extremities.  I will check proBNP and Chem-7 again I would like to see if there is any room to give her a little more diuretics however concern is her blood pressure being low.  Will continue monitoring.  Good news ejection fraction is preserved. I was worried about potentially having a cardiomyopathy which likely is not the case. Essential hypertension we have always the problem today blood pressure being a little low.  Will continue monitoring in the future we may be forced to cut down her olmesartan. Alcohol abuse she says she is doing fine from that aspect   Medication Adjustments/Labs and Tests Ordered: Current medicines are reviewed at length with the patient today.  Concerns regarding medicines are outlined above.  Orders Placed This Encounter  Procedures   Basic metabolic panel   Pro b natriuretic peptide (BNP)   Medication changes: No orders of the defined types were placed in this encounter.   Signed, Georgeanna Lea, MD, [facility] 08/06/2023 12:06 PM    [facility]

## 2023-08-06 NOTE — Patient Instructions (Signed)
Medication Instructions:  Your physician recommends that you continue on your current medications as directed. Please refer to the Current Medication list given to you today.  *If you need a refill on your cardiac medications before your next appointment, please call your pharmacy*   Lab Work: 3rd Floor   [address]  Your physician recommends that you return for lab work in:  when lab open  You need to have labs done when you are fasting.  You can come Monday through Friday 8:00 am to 11:30AM and 1:00 to 4:00. You do not need to make an appointment as the order has already been placed.    Testing/Procedures: None Ordered   Follow-Up: At [facility], you and your health needs are our priority.  As part of our continuing mission to provide you with exceptional heart care, we have created designated Provider Care Teams.  These Care Teams include your primary Cardiologist (physician) and Advanced Practice Providers (APPs -  Physician Assistants and Nurse Practitioners) who all work together to provide you with the care you need, when you need it.  We recommend signing up for the patient portal called "MyChart".  Sign up information is provided on this After Visit Summary.  MyChart is used to connect with patients for Virtual Visits (Telemedicine).  Patients are able to view lab/test results, encounter notes, upcoming appointments, etc.  Non-urgent messages can be sent to your provider as well.   To learn more about what you can do with MyChart, go to [email or web address].    Your next appointment:   4 month(s)  The format for your next appointment:   In Person  Provider:   Gypsy Balsam, MD    Other Instructions NA

## 2023-08-07 ENCOUNTER — Telehealth: Payer: Self-pay | Admitting: *Deleted

## 2023-08-07 NOTE — Transitions of Care (Post Inpatient/ED Visit) (Signed)
   08/07/2023  Name: Catherine Hubbard MRN: [id number] DOB: [birth year]  Today's TOC FU Call Status: Today's TOC FU Call Status:: Unsuccessful Call (2nd Attempt) Unsuccessful Call (2nd Attempt) Date: 08/07/23  Attempted to reach the patient regarding the most recent Inpatient/ED visit.  Follow Up Plan: No further outreach attempts will be made at this time. We have been unable to contact the patient.  Signature Teiara Baria, [facility]

## 2023-08-09 ENCOUNTER — Telehealth: Payer: Self-pay | Admitting: Cardiology

## 2023-08-09 ENCOUNTER — Ambulatory Visit: Payer: Federal, State, Local not specified - PPO | Admitting: Medical

## 2023-08-09 VITALS — BP 116/80 | HR 74 | Temp 98.0°F | Resp 18 | Ht 63.0 in | Wt 156.6 lb

## 2023-08-09 DIAGNOSIS — R300 Dysuria: Secondary | ICD-10-CM | POA: Diagnosis not present

## 2023-08-09 DIAGNOSIS — I10 Essential (primary) hypertension: Secondary | ICD-10-CM

## 2023-08-09 DIAGNOSIS — R0609 Other forms of dyspnea: Secondary | ICD-10-CM

## 2023-08-09 DIAGNOSIS — F419 Anxiety disorder, unspecified: Secondary | ICD-10-CM | POA: Diagnosis not present

## 2023-08-09 DIAGNOSIS — G8929 Other chronic pain: Secondary | ICD-10-CM

## 2023-08-09 DIAGNOSIS — M25562 Pain in left knee: Secondary | ICD-10-CM | POA: Diagnosis not present

## 2023-08-09 DIAGNOSIS — D72829 Elevated white blood cell count, unspecified: Secondary | ICD-10-CM

## 2023-08-09 DIAGNOSIS — G629 Polyneuropathy, unspecified: Secondary | ICD-10-CM

## 2023-08-09 DIAGNOSIS — K219 Gastro-esophageal reflux disease without esophagitis: Secondary | ICD-10-CM | POA: Diagnosis not present

## 2023-08-09 LAB — CBC WITH DIFFERENTIAL/PLATELET
Basophils Absolute: 0.1 10*3/uL (ref 0.0–0.1)
Basophils Relative: 1.2 % (ref 0.0–3.0)
Eosinophils Absolute: 0.4 10*3/uL (ref 0.0–0.7)
Eosinophils Relative: 5.2 % — ABNORMAL HIGH (ref 0.0–5.0)
HCT: 36.6 % (ref 36.0–46.0)
Hemoglobin: 12.4 g/dL (ref 12.0–15.0)
Lymphocytes Relative: 17.3 % (ref 12.0–46.0)
Lymphs Abs: 1.4 10*3/uL (ref 0.7–4.0)
MCHC: 33.7 g/dL (ref 30.0–36.0)
MCV: 99.3 fL (ref 78.0–100.0)
Monocytes Absolute: 1 10*3/uL (ref 0.1–1.0)
Monocytes Relative: 12.6 % — ABNORMAL HIGH (ref 3.0–12.0)
Neutro Abs: 5.2 10*3/uL (ref 1.4–7.7)
Neutrophils Relative %: 63.7 % (ref 43.0–77.0)
Platelets: 321 10*3/uL (ref 150.0–400.0)
RBC: 3.69 Mil/uL — ABNORMAL LOW (ref 3.87–5.11)
RDW: 12.9 % (ref 11.5–15.5)
WBC: 8.2 10*3/uL (ref 4.0–10.5)

## 2023-08-09 LAB — COMPREHENSIVE METABOLIC PANEL
ALT: 11 U/L (ref 0–35)
AST: 23 U/L (ref 0–37)
Albumin: 4.4 g/dL (ref 3.5–5.2)
Alkaline Phosphatase: 74 U/L (ref 39–117)
BUN: 21 mg/dL (ref 6–23)
CO2: 32 meq/L (ref 19–32)
Calcium: 10.3 mg/dL (ref 8.4–10.5)
Chloride: 98 meq/L (ref 96–112)
Creatinine, Ser: 0.78 mg/dL (ref 0.40–1.20)
GFR: 81.01 mL/min (ref >60.00)
Glucose, Bld: 89 mg/dL (ref 70–99)
Potassium: 4.9 meq/L (ref 3.5–5.1)
Sodium: 139 meq/L (ref 135–145)
Total Bilirubin: 1 mg/dL (ref 0.2–1.2)
Total Protein: 6.7 g/dL (ref 6.0–8.3)

## 2023-08-09 LAB — VITAMIN B12: Vitamin B-12: 322 pg/mL (ref 211–911)

## 2023-08-09 LAB — BRAIN NATRIURETIC PEPTIDE: Pro B Natriuretic peptide (BNP): 70 pg/mL (ref 0.0–100.0)

## 2023-08-09 MED ORDER — FLUCONAZOLE 150 MG PO TABS: 150.0000 mg | ORAL_TABLET | Freq: Every day | ORAL | 0 refills | Status: DC

## 2023-08-09 MED ORDER — PANTOPRAZOLE SODIUM 40 MG PO TBEC: 40.0000 mg | DELAYED_RELEASE_TABLET | Freq: Every day | ORAL | 3 refills | Status: DC

## 2023-08-09 MED ORDER — BUSPIRONE HCL 7.5 MG PO TABS: 7.5000 mg | ORAL_TABLET | Freq: Two times a day (BID) | ORAL | 0 refills | Status: DC

## 2023-08-09 MED ORDER — BUDESONIDE-FORMOTEROL FUMARATE 160-4.5 MCG/ACT IN AERO: 2.0000 | INHALATION_SPRAY | Freq: Two times a day (BID) | RESPIRATORY_TRACT | 3 refills | Status: DC

## 2023-08-09 NOTE — Telephone Encounter (Signed)
Pt is at [facility] at [employer] and they cannot see labs.  ASAP

## 2023-08-09 NOTE — Patient Instructions (Signed)
Anxiety Reports benefit from Ativan during MRI procedures. Daily stressors present. Discussed options for daily anxiety management. -Start Buspar 7.5mg  twice daily for baseline anxiety. -may add on ativan in future if needed. But if so will give low number and low dose. Rx advisement given.   Left Knee Pain Reports severe pain, using Hydrocodone 5-325mg  sparingly. MRI scheduled for 08/11/2023. -Continue current to follow up with sports med MD and pain management.  Urinary Tract Infection Recently treated with 7-day course of Keflex, reports some residual symptoms. -Collect urine sample for culture. -Prescribe Diflucan to be taken on 08/11/2023 in case of yeast infection post-antibiotic treatment.  Chronic Obstructive Pulmonary Disease (COPD) Reports increased use of inhalers over the past month, but overall improvement since ED visit. -increase symbicort. 160-4.5 2 inh twice daily. -Confirm patient has albuterol solution and nebulizer machine for use as needed.  Peripheral Neuropathy Reports sudden, severe pain in both feet. -b12 and b1 level  Hypertension Reports cardiologist suggested changes to blood pressure medication due to readings around 100/70. -Order metabolic panel and BNP as previously suggested by cardiologist. -Discuss potential changes to blood pressure medication regimen with cardiologist after lab results are available.   Follow up date to be determined after lab review.

## 2023-08-09 NOTE — Progress Notes (Signed)
Subjective:    Patient ID: Catherine Hubbard, female    DOB: 14-Jul-1961, 62 y.o.   MRN: [id number]  HPI Discussed the use of AI scribe software for clinical note transcription with the patient, who gave verbal consent to proceed.  History of Present Illness   The patient, with a history of anxiety, urinary tract infection (UTI), and left knee pain, presents with multiple concerns.    She reports that the Protonix has been effective in managing her reflux. States need refill.    However, she has been experiencing increased anxiety, particularly during medical procedures such as MRIs. She has found relief from Ativan, which was administered during these procedures, and is interested in exploring options for managing her anxiety on a more regular basis.  The patient also reports a recent UTI, for which she was prescribed Keflex. However, she has also noticed some discomfort in the urethral area, which she describes as itching and burning.  In addition to these concerns, the patient has been experiencing left knee pain. She has been prescribed hydrocodone 5-325, which she uses sparingly when the pain becomes unbearable. An MRI of the left knee has been scheduled to further investigate this issue. Pt being followed by sports med and pain managemtnent.  The patient also reports increased use of her asthma inhaler and nebulizer over the past month, although she notes that her breathing has improved since her last emergency department visit. She admits over the months requiring more regular use of inhalers. She is using symbicort 80 1 inhalation twice daily and albuterol as back up.      She also describes a shooting pain in the bottom of her feet, which she describes as nerve-like in nature. This pain is sudden and transient, causing her feet to tighten up into a ball.        Review of Systems  Constitutional:  Negative for fatigue and fever.  HENT:  Negative for congestion and  drooling.   Respiratory:  Positive for shortness of breath and wheezing. Negative for cough and chest tightness.   Cardiovascular:  Negative for chest pain and palpitations.  Gastrointestinal:  Negative for abdominal pain and blood in stool.  Genitourinary:  Positive for dysuria. Negative for decreased urine volume, difficulty urinating, menstrual problem, vaginal discharge and vaginal pain.       Slight itching reported. See hpi.  Musculoskeletal:  Negative for back pain.  Neurological:  Negative for dizziness and weakness.  Hematological:  Negative for adenopathy. Does not bruise/bleed easily.  Psychiatric/Behavioral:  Negative for agitation, self-injury and suicidal ideas. The patient is nervous/anxious.     Past Medical History:  Diagnosis Date   Alcohol abuse    Asthma    Esophagitis    GERD (gastroesophageal reflux disease)    Headache    Hearing loss    Hypertension    Multiple sclerosis (HCC)    Pancreatitis    Vision abnormalities      Social History   Socioeconomic History   Marital status: Married    Spouse name: Not on file   Number of children: Not on file   Years of education: Not on file   Highest education level: Not on file  Occupational History   Not on file  Tobacco Use   Smoking status: Never   Smokeless tobacco: Never   Tobacco comments:    second hand smoker when young. Heavy exposure.  Vaping Use   Vaping status: Never Used  Substance and Sexual Activity  Alcohol use: Yes    Alcohol/week: 6.0 standard drinks of alcohol    Types: 6 Cans of beer per week    Comment: daily, couple of shots ( weekly depending on day)   Drug use: No   Sexual activity: Not Currently  Other Topics Concern   Not on file  Social History Narrative   Not on file   Social Drivers of Health   Financial Resource Strain: Not on file  Food Insecurity: No Food Insecurity (06/19/2023)   Hunger Vital Sign    Worried About Running Out of Food in the Last Year: Never  true    Ran Out of Food in the Last Year: Never true  Transportation Needs: No Transportation Needs (06/19/2023)   PRAPARE - Administrator, Civil Service (Medical): No    Lack of Transportation (Non-Medical): No  Physical Activity: Not on file  Stress: Not on file  Social Connections: Not on file  Intimate Partner Violence: Not At Risk (06/19/2023)   Humiliation, Afraid, Rape, and Kick questionnaire    Fear of Current or Ex-Partner: No    Emotionally Abused: No    Physically Abused: No    Sexually Abused: No    Past Surgical History:  Procedure Laterality Date   ANKLE FRACTURE SURGERY Bilateral    CARPAL TUNNEL RELEASE Bilateral    CHOLECYSTECTOMY     COLONOSCOPY  11/2016   multiple   ESOPHAGOGASTRODUODENOSCOPY  11/2016   ESOPHAGOGASTRODUODENOSCOPY N/A 02/01/2017   Procedure: ESOPHAGOGASTRODUODENOSCOPY (EGD);  Surgeon: Iva Boop, MD;  Location: [facility] ENDOSCOPY;  Service: Endoscopy;  Laterality: N/A;   ESOPHAGOGASTRODUODENOSCOPY (EGD) WITH PROPOFOL N/A 02/04/2017   Procedure: ESOPHAGOGASTRODUODENOSCOPY (EGD) WITH PROPOFOL;  Surgeon: Meryl Dare, MD;  Location: [facility] END[facility]COPY;  Service: Endoscopy;  Laterality: N/A;   LAPAROSCOPIC GASTRIC SLEEVE RESECTION  2011   ULNAR NERVE REPAIR Bilateral     Family History  Problem Relation Age of Onset   Cancer Mother    Stroke Mother    Cancer Father     Allergies  Allergen Reactions   Ibuprofen Other (See Comments)    Cannot take due to gastric bypass   Nsaids Other (See Comments)    Can not take due to Gastric bypass    Current Outpatient Medications on File Prior to Visit  Medication Sig Dispense Refill   acetaminophen (TYLENOL) 500 MG tablet Take 2 tablets (1,000 mg total) by mouth every 6 (six) hours as needed for mild pain (pain score 1-3).     albuterol (PROVENTIL) (2.5 MG/3ML) 0.083% nebulizer solution Take 3 mLs (2.5 mg total) by nebulization every 6 (six) hours as needed for wheezing or shortness of  breath. 150 mL 0   albuterol (VENTOLIN HFA) 108 (90 Base) MCG/ACT inhaler Inhale 2 puffs into the lungs every 6 (six) hours as needed for wheezing or shortness of breath. 18 g 1   Ascorbic Acid (VITAMIN C PO) Take 1 tablet by mouth daily. 1 tablet daily     B Complex Vitamins (VITAMIN B COMPLEX PO) Take 1 tablet by mouth daily. 1 daily     baclofen (LIORESAL) 10 MG tablet TAKE 1 TABLET BY MOUTH THREE TIMES A DAY AS NEEDED FOR MUSCLE SPASMS (Patient taking differently: Take 10 mg by mouth 3 (three) times daily as needed for muscle spasms.) 270 tablet 0   benzonatate (TESSALON) 100 MG capsule Take 1 capsule (100 mg total) by mouth 3 (three) times daily as needed for cough. 30 capsule 0  bismuth subsalicylate (PEPTO BISMOL) 262 MG/15ML suspension Take 30 mLs by mouth daily as needed for indigestion or diarrhea or loose stools (acid reflux).     budesonide-formoterol (SYMBICORT) 80-4.5 MCG/ACT inhaler TAKE 2 PUFFS BY MOUTH TWICE A DAY (Patient taking differently: Inhale 2 puffs into the lungs 2 (two) times daily.) 30.6 each 1   Calcium Carbonate Antacid (TUMS PO) Take 2 tablets by mouth 2 (two) times daily as needed (acid reflux).     cephALEXin (KEFLEX) 500 MG capsule Take 1 capsule (500 mg total) by mouth 4 (four) times daily. 28 capsule 0   Cholecalciferol (D2000 ULTRA STRENGTH) 50 MCG (2000 UT) CAPS Take 2,000 Units by mouth daily.     diclofenac Sodium (VOLTAREN) 1 % GEL Apply 4 g topically 4 (four) times daily. (Patient taking differently: Apply 4 g topically 4 (four) times daily as needed (joint pain).) 400 g 1   fluticasone (FLONASE) 50 MCG/ACT nasal spray SPRAY 2 SPRAYS INTO EACH NOSTRIL EVERY DAY (Patient taking differently: Place 2 sprays into both nostrils once as needed for allergies.) 48 mL 3   fluticasone (FLONASE) 50 MCG/ACT nasal spray Place 2 sprays into both nostrils daily. 16 g 1   furosemide (LASIX) 40 MG tablet Take 1 tablet (40 mg total) by mouth daily. 90 tablet 3   gabapentin  (NEURONTIN) 100 MG capsule TAKE 1 CAPSULE BY MOUTH THREE TIMES A DAY (Patient taking differently: Take 100 mg by mouth 3 (three) times daily as needed (nerve pain).) 90 capsule 1   HYDROcodone-acetaminophen (NORCO) 5-325 MG tablet 1 tab po q 6 hours prn severe pain (Patient taking differently: Take 1 tablet by mouth every 6 (six) hours as needed for moderate pain (pain score 4-6) or severe pain (pain score 7-10). 1 tab po q 6 hours prn severe pain) 30 tablet 0   Iron, Ferrous Sulfate, 325 (65 Fe) MG TABS 1 tab po bid (Patient taking differently: Take 1 tablet by mouth in the morning and at bedtime. 1 tab po bid) 60 tablet 3   modafinil (PROVIGIL) 200 MG tablet Take one po qAM and 1/2 po qPM (Patient taking differently: Take 250 mg by mouth daily. One and one-half tablets by mouth before noon.) 45 tablet 5   Multiple Vitamin (MULTIVITAMIN WITH MINERALS) TABS tablet Take 1 tablet by mouth daily.     nystatin cream (MYCOSTATIN) Apply 1 Application topically 2 (two) times daily. (Patient taking differently: Apply 1 Application topically 2 (two) times daily as needed for dry skin.) 30 g 1   ocrelizumab (OCREVUS) 300 MG/10ML injection INFUSE 600MG  INTRAVENOUSLY AS DIRECTED EVERY 6 MONTHS. (Patient taking differently: Inject 600 mg into the vein every 6 (six) months.) 20 mL 1   olmesartan (BENICAR) 5 MG tablet 2 tab po q day (Patient taking differently: Take 10 mg by mouth daily. 2 tab po q day) 180 tablet 3   ondansetron (ZOFRAN) 4 MG tablet Take 1 tablet (4 mg total) by mouth every 6 (six) hours. 12 tablet 0   oxybutynin (DITROPAN) 5 MG tablet TAKE 1 TABLET BY MOUTH TWICE A DAY 180 tablet 0   pantoprazole (PROTONIX) 40 MG tablet Take 1 tablet (40 mg total) by mouth daily. 30 tablet 0   potassium chloride SA (KLOR-CON M) 20 MEQ tablet Take 1 tablet (20 mEq total) by mouth daily. 90 tablet 3   Probiotic Product (PROBIOTIC DAILY PO) Take 1 tablet by mouth 3 (three) times a week. 1 daily     traMADol (ULTRAM) 50  MG tablet 1 tab po bid prn severe pain (Patient taking differently: Take 50 mg by mouth 2 (two) times daily as needed for moderate pain (pain score 4-6) or severe pain (pain score 7-10). 1 tab po bid prn severe pain) 10 tablet 0   Current Facility-Administered Medications on File Prior to Visit  Medication Dose Route Frequency Provider Last Rate Last Admin   sodium chloride (PF) 0.9 % injection 2 mL  2 mL Intravenous PRN         BP 116/80   Pulse 74   Temp 98 F (36.7 C)   Resp 18   Ht 5\' 3"  (1.6 m)   Wt 156 lb 9.6 oz (71 kg)   LMP 08/13/2014 Comment: very few periods  SpO2 100%   BMI 27.74 kg/m        Objective:   Physical Exam  General Mental Status- Alert. General Appearance- Not in acute distress.   Skin General: Color- Normal Color. Moisture- Normal Moisture.  Neck Carotid Arteries- Normal color. Moisture- Normal Moisture. No carotid bruits. No JVD.  Chest and Lung Exam Auscultation: Breath Sounds:-Normal.  Cardiovascular Auscultation:Rythm- Regular. Murmurs & Other Heart Sounds:Auscultation of the heart reveals- No Murmurs.  Abdomen Inspection:-Inspeection Normal. Palpation/Percussion:Note:No mass. Palpation and Percussion of the abdomen reveal- Non Tender, Non Distended + BS, no rebound or guarding.   Neurologic Cranial Nerve exam:- CN III-XII intact(No nystagmus), symmetric smile. Strength:- 5/5 equal and symmetric strength both upper and lower extremities.   Lower ext- no pedal edema, negative homans sign.calfs symmetric.    Assessment & Plan:  Assessment and Plan    Anxiety Reports benefit from Ativan during MRI procedures. Daily stressors present. Discussed options for daily anxiety management. -Start Buspar 7.5mg  twice daily for baseline anxiety. -may add on ativan in future if needed. But if so will give low number and low dose. Rx advisement given.   Left Knee Pain Reports severe pain, using Hydrocodone 5-325mg  sparingly. MRI scheduled for  08/11/2023. -Continue current to follow up with sports med MD and pain management.  Urinary Tract Infection Recently treated with 7-day course of Keflex, reports some residual symptoms. -Collect urine sample for culture. -Prescribe Diflucan to be taken on 08/11/2023 in case of yeast infection post-antibiotic treatment.  Chronic Obstructive Pulmonary Disease (COPD) Reports increased use of inhalers over the past month, but overall improvement since ED visit. -increase symbicort. 160-4.5 2 inh twice daily. -Confirm patient has albuterol solution and nebulizer machine for use as needed.  Peripheral Neuropathy Reports sudden, severe pain in both feet. -b12 and b1 level  Hypertension Reports cardiologist suggested changes to blood pressure medication due to readings around 100/70. -Order metabolic panel and BNP as previously suggested by cardiologist. -Discuss potential changes to blood pressure medication regimen with cardiologist after lab results are available.   Follow up date to be determined after lab review.       [name], PA-C   Time spent with patient today was 42  minutes which consisted of chart revdiew, discussing diagnosis, work up treatment and documentation.

## 2023-08-11 ENCOUNTER — Ambulatory Visit (HOSPITAL_BASED_OUTPATIENT_CLINIC_OR_DEPARTMENT_OTHER)
Admission: RE | Admit: 2023-08-11 | Discharge: 2023-08-11 | Disposition: A | Discharge status: Home / Self Care | Payer: Federal, State, Local not specified - PPO | Source: Ambulatory Visit | Attending: Sports Medicine | Admitting: Sports Medicine

## 2023-08-11 DIAGNOSIS — S83282A Other tear of lateral meniscus, current injury, left knee, initial encounter: Secondary | ICD-10-CM | POA: Diagnosis not present

## 2023-08-11 DIAGNOSIS — M1712 Unilateral primary osteoarthritis, left knee: Secondary | ICD-10-CM | POA: Diagnosis not present

## 2023-08-11 DIAGNOSIS — S83242A Other tear of medial meniscus, current injury, left knee, initial encounter: Secondary | ICD-10-CM | POA: Diagnosis not present

## 2023-08-11 DIAGNOSIS — M25462 Effusion, left knee: Secondary | ICD-10-CM | POA: Insufficient documentation

## 2023-08-11 LAB — URINE CULTURE
MICRO NUMBER:: 15876788
Result:: NO GROWTH
SPECIMEN QUALITY:: ADEQUATE

## 2023-08-11 NOTE — Progress Notes (Unsigned)
[facility]  PATIENT: Catherine Hubbard DOB: May 16, 1961   _________________________________   HISTORICAL  CHIEF COMPLAINT:  No chief complaint on file.   HISTORY OF PRESENT ILLNESS:  Catherine Hubbard is a 62 y.o. woman with relapsing remitting MS.    Update 08/12/2023: ***She presented to the emergency room on 07/17/2023 due to difficulty holding onto items for the previous 2 days.  Additionally she noted some double vision.  MRI of the brain and cervical spine was reported as showing progression.  There was also report of a new lesion at C5 compared to previous MRIs.  On my review, no definite progression compared to 2020 in the brain.  She has had 2 small strokes since 2020 in the left temporal and right frontal lobe but they were present on the CT scan from 2023.  In the cervical spine, there is possibly a new lesion posterolaterally to the left at C5.  As there were no acute lesions on the MRI, steroids were not recommended  She is on Ocrevus and denies any new exacerbations. She is tolerating it well.   Last year, CD20 count was 0 and IgG and IgM were normal.      She denies exacerbation though feels her gait is worse - she feels more related to knee pain than her MS.     She is walking without her cane.  She has no recent falls.  She uses the banister on stairs.  She has mild weakness in her legs.    She denies numbness but ha mild tingling at times in hands.     Vision is doing well.      She feels bladder function is unchanged with urinary urgency and no recent incontinence.  . She takes oxybutynin if she could be out of the house.  She reports being fatigued.  Modafinil helps some.  She did better on 1-1/2 pills a day instead of 1 pill a day.  Mood has been stable.  She reports she is rarely drinking ETOH now.     Sleep varies.    She has needed to do DMV forms in the past even though we stated she did not need serial evaluations.   If we get a form, we  can fill it   MS History:   She was diagnosed with multiple sclerosis in 2009. Before that time, she had noted some gait ataxia but had not had any imaging studies. In 2009, due to progressive hearing loss, she had an MRI of the brain and  then had a lumbar puncture that also confirmed the diagnosis.  I have some of her early MRI reports there are several foci noted in the cervical and thoracic spinal cord in the brain, there were multiple hyperintense foci, mostly in the periventricular deep white matter many of these were contrast enhancing on 07/26/2008. There was another enhancing lesion adjacent to C2 in the spinal cord.  Initially, she was placed on Avonex. She did not feel good and she stopped. She then tried Copaxone but also stopped after a while. She moved here in 2014 and saw Dr. Renne Crigler at [facility]. She was placed on Rebif. She tolerates Rebif well.        IMAGING: MRI from 12/04/2012  showed multiple foci in the spinal cord at C2, C3-C4, and C4-C5. There were also foci at T4, T7, T9 and T10. The MRI of the brain showed 2 nonenhancing foci not present from studies in 2011. She reports  having an MRI at [facility] 2015 but we do not have those films.  MRI cervical spine 07/22/2021 shows foci at C2, C4 and C5, seen previously.   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   MRI brain 11/16/2018 showed MS lesion with no progression compared to 2018.    MRI of the brain 07/17/2023 showed MS lesions in the cerebral hemispheres very slightly progressed compared to the 2020 MRI.  Additional foci noted in the pons and adjacent to C2 in the spinal cord.  There also were small chronic infarctions In the left lateral temporal lobe and right frontal lobe, not clearly seen on the previous MRI from 2020 but present on the CT scan from 12/06/2018 2020.  No acute findings.  MRI of the cervical spine 07/17/2023 showed T2 hyperintense foci centrally at C2, to the right adjacent to C3-C4, posteriorly  adjacent to C4-C5, posteriorly to the left adjacent to C5 and centrally adjacent to C5-C6.  None of these appear to be acute.  There was mild spinal stenosis at C5-C6.  No nerve root compression.   REVIEW OF SYSTEMS:  Constitutional: No fevers, chills, sweats, or change in appetite.    She reports fatigue and insomnia Eyes: see above.  No eye pain  But some light sensitivity. Ear, nose and throat: No hearing loss, ear pain, nasal congestion, sore throat.   tinnitus Cardiovascular: No chest pain, palpitations Respiratory:  No shortness of breath at rest or with exertion.   Some wheezes, once on asthma med's Gastrointestinal: No nausea, vomiting, diarrhea, abdominal pain, fecal incontinence.  She has esophagitis Genitourinary:  see above Musculoskeletal:  see above  She also notes neck pain Integumentary: No rash, pruritus, skin lesions Neurological: as above Psychiatric: as above Endocrine: No palpitations, diaphoresis, change in appetite, change in weigh or increased thirst.  Often feels cold Hematologic/Lymphatic:  No anemia, purpura, petechiae. Allergic/Immunologic: No itchy/runny eyes, nasal congestion, recent allergic reactions, rashes  ALLERGIES: Allergies  Allergen Reactions   Ibuprofen Other (See Comments)    Cannot take due to gastric bypass   Nsaids Other (See Comments)    Can not take due to Gastric bypass    HOME MEDICATIONS:  Current Outpatient Medications:    acetaminophen (TYLENOL) 500 MG tablet, Take 2 tablets (1,000 mg total) by mouth every 6 (six) hours as needed for mild pain (pain score 1-3)., Disp: , Rfl:    albuterol (PROVENTIL) (2.5 MG/3ML) 0.083% nebulizer solution, Take 3 mLs (2.5 mg total) by nebulization every 6 (six) hours as needed for wheezing or shortness of breath., Disp: 150 mL, Rfl: 0   albuterol (VENTOLIN HFA) 108 (90 Base) MCG/ACT inhaler, Inhale 2 puffs into the lungs every 6 (six) hours as needed for wheezing or shortness of breath., Disp: 18 g,  Rfl: 1   Ascorbic Acid (VITAMIN C PO), Take 1 tablet by mouth daily. 1 tablet daily, Disp: , Rfl:    B Complex Vitamins (VITAMIN B COMPLEX PO), Take 1 tablet by mouth daily. 1 daily, Disp: , Rfl:    baclofen (LIORESAL) 10 MG tablet, TAKE 1 TABLET BY MOUTH THREE TIMES A DAY AS NEEDED FOR MUSCLE SPASMS (Patient taking differently: Take 10 mg by mouth 3 (three) times daily as needed for muscle spasms.), Disp: 270 tablet, Rfl: 0   benzonatate (TESSALON) 100 MG capsule, Take 1 capsule (100 mg total) by mouth 3 (three) times daily as needed for cough., Disp: 30 capsule, Rfl: 0   bismuth subsalicylate (PEPTO BISMOL)  262 MG/15ML suspension, Take 30 mLs by mouth daily as needed for indigestion or diarrhea or loose stools (acid reflux)., Disp: , Rfl:    budesonide-formoterol (SYMBICORT) 160-4.5 MCG/ACT inhaler, Inhale 2 puffs into the lungs 2 (two) times daily., Disp: 1 each, Rfl: 3   budesonide-formoterol (SYMBICORT) 80-4.5 MCG/ACT inhaler, TAKE 2 PUFFS BY MOUTH TWICE A DAY (Patient taking differently: Inhale 2 puffs into the lungs 2 (two) times daily.), Disp: 30.6 each, Rfl: 1   busPIRone (BUSPAR) 7.5 MG tablet, Take 1 tablet (7.5 mg total) by mouth 2 (two) times daily., Disp: 60 tablet, Rfl: 0   Calcium Carbonate Antacid (TUMS PO), Take 2 tablets by mouth 2 (two) times daily as needed (acid reflux)., Disp: , Rfl:    cephALEXin (KEFLEX) 500 MG capsule, Take 1 capsule (500 mg total) by mouth 4 (four) times daily., Disp: 28 capsule, Rfl: 0   Cholecalciferol (D2000 ULTRA STRENGTH) 50 MCG (2000 UT) CAPS, Take 2,000 Units by mouth daily., Disp: , Rfl:    diclofenac Sodium (VOLTAREN) 1 % GEL, Apply 4 g topically 4 (four) times daily. (Patient taking differently: Apply 4 g topically 4 (four) times daily as needed (joint pain).), Disp: 400 g, Rfl: 1   fluconazole (DIFLUCAN) 150 MG tablet, Take 1 tablet (150 mg total) by mouth daily., Disp: 1 tablet, Rfl: 0   fluticasone (FLONASE) 50 MCG/ACT nasal spray, SPRAY 2 SPRAYS  INTO EACH NOSTRIL EVERY DAY (Patient taking differently: Place 2 sprays into both nostrils once as needed for allergies.), Disp: 48 mL, Rfl: 3   fluticasone (FLONASE) 50 MCG/ACT nasal spray, Place 2 sprays into both nostrils daily., Disp: 16 g, Rfl: 1   furosemide (LASIX) 40 MG tablet, Take 1 tablet (40 mg total) by mouth daily., Disp: 90 tablet, Rfl: 3   gabapentin (NEURONTIN) 100 MG capsule, TAKE 1 CAPSULE BY MOUTH THREE TIMES A DAY (Patient taking differently: Take 100 mg by mouth 3 (three) times daily as needed (nerve pain).), Disp: 90 capsule, Rfl: 1   HYDROcodone-acetaminophen (NORCO) 5-325 MG tablet, 1 tab po q 6 hours prn severe pain (Patient taking differently: Take 1 tablet by mouth every 6 (six) hours as needed for moderate pain (pain score 4-6) or severe pain (pain score 7-10). 1 tab po q 6 hours prn severe pain), Disp: 30 tablet, Rfl: 0   Iron, Ferrous Sulfate, 325 (65 Fe) MG TABS, 1 tab po bid (Patient taking differently: Take 1 tablet by mouth in the morning and at bedtime. 1 tab po bid), Disp: 60 tablet, Rfl: 3   modafinil (PROVIGIL) 200 MG tablet, Take one po qAM and 1/2 po qPM (Patient taking differently: Take 250 mg by mouth daily. One and one-half tablets by mouth before noon.), Disp: 45 tablet, Rfl: 5   Multiple Vitamin (MULTIVITAMIN WITH MINERALS) TABS tablet, Take 1 tablet by mouth daily., Disp: , Rfl:    nystatin cream (MYCOSTATIN), Apply 1 Application topically 2 (two) times daily. (Patient taking differently: Apply 1 Application topically 2 (two) times daily as needed for dry skin.), Disp: 30 g, Rfl: 1   ocrelizumab (OCREVUS) 300 MG/10ML injection, INFUSE 600MG  INTRAVENOUSLY AS DIRECTED EVERY 6 MONTHS. (Patient taking differently: Inject 600 mg into the vein every 6 (six) months.), Disp: 20 mL, Rfl: 1   olmesartan (BENICAR) 5 MG tablet, 2 tab po q day (Patient taking differently: Take 10 mg by mouth daily. 2 tab po q day), Disp: 180 tablet, Rfl: 3   ondansetron (ZOFRAN) 4 MG  tablet, Take 1  tablet (4 mg total) by mouth every 6 (six) hours., Disp: 12 tablet, Rfl: 0   oxybutynin (DITROPAN) 5 MG tablet, TAKE 1 TABLET BY MOUTH TWICE A DAY, Disp: 180 tablet, Rfl: 0   pantoprazole (PROTONIX) 40 MG tablet, Take 1 tablet (40 mg total) by mouth daily., Disp: 30 tablet, Rfl: 0   pantoprazole (PROTONIX) 40 MG tablet, Take 1 tablet (40 mg total) by mouth daily., Disp: 30 tablet, Rfl: 3   potassium chloride SA (KLOR-CON M) 20 MEQ tablet, Take 1 tablet (20 mEq total) by mouth daily., Disp: 90 tablet, Rfl: 3   Probiotic Product (PROBIOTIC DAILY PO), Take 1 tablet by mouth 3 (three) times a week. 1 daily, Disp: , Rfl:    traMADol (ULTRAM) 50 MG tablet, 1 tab po bid prn severe pain (Patient taking differently: Take 50 mg by mouth 2 (two) times daily as needed for moderate pain (pain score 4-6) or severe pain (pain score 7-10). 1 tab po bid prn severe pain), Disp: 10 tablet, Rfl: 0  Current Facility-Administered Medications:    sodium chloride (PF) 0.9 % injection 2 mL, 2 mL, Intravenous, PRN,   PAST MEDICAL HISTORY: Past Medical History:  Diagnosis Date   Alcohol abuse    Asthma    Esophagitis    GERD (gastroesophageal reflux disease)    Headache    Hearing loss    Hypertension    Multiple sclerosis (HCC)    Pancreatitis    Vision abnormalities     PAST SURGICAL HISTORY: Past Surgical History:  Procedure Laterality Date   ANKLE FRACTURE SURGERY Bilateral    CARPAL TUNNEL RELEASE Bilateral    CHOLECYSTECTOMY     COLONOSCOPY  11/2016   multiple   ESOPHAGOGASTRODUODENOSCOPY  11/2016   ESOPHAGOGASTRODUODENOSCOPY N/A 02/01/2017   Procedure: ESOPHAGOGASTRODUODENOSCOPY (EGD);  Surgeon: Iva Boop, MD;  Location: [facility] ENDOSCOPY;  Service: Endoscopy;  Laterality: N/A;   ESOPHAGOGASTRODUODENOSCOPY (EGD) WITH PROPOFOL N/A 02/04/2017   Procedure: ESOPHAGOGASTRODUODENOSCOPY (EGD) WITH PROPOFOL;  Surgeon: Meryl Dare, MD;  Location: [facility] ENDOSCOPY;  Service: Endoscopy;   Laterality: N/A;   LAPAROSCOPIC GASTRIC SLEEVE RESECTION  2011   ULNAR NERVE REPAIR Bilateral     FAMILY HISTORY: Family History  Problem Relation Age of Onset   Cancer Mother    Stroke Mother    Cancer Father     SOCIAL HISTORY:  Social History   Socioeconomic History   Marital status: Married    Spouse name: Not on file   Number of children: Not on file   Years of education: Not on file   Highest education level: Not on file  Occupational History   Not on file  Tobacco Use   Smoking status: Never   Smokeless tobacco: Never   Tobacco comments:    second hand smoker when young. Heavy exposure.  Vaping Use   Vaping status: Never Used  Substance and Sexual Activity   Alcohol use: Yes    Alcohol/week: 6.0 standard drinks of alcohol    Types: 6 Cans of beer per week    Comment: daily, couple of shots ( weekly depending on day)   Drug use: No   Sexual activity: Not Currently  Other Topics Concern   Not on file  Social History Narrative   Not on file   Social Drivers of Health   Financial Resource Strain: Not on file  Food Insecurity: No Food Insecurity (06/19/2023)   Hunger Vital Sign    Worried About Running Out of Food in  the Last Year: Never true    Ran Out of Food in the Last Year: Never true  Transportation Needs: No Transportation Needs (06/19/2023)   PRAPARE - Administrator, Civil Service (Medical): No    Lack of Transportation (Non-Medical): No  Physical Activity: Not on file  Stress: Not on file  Social Connections: Not on file  Intimate Partner Violence: Not At Risk (06/19/2023)   Humiliation, Afraid, Rape, and Kick questionnaire    Fear of Current or Ex-Partner: No    Emotionally Abused: No    Physically Abused: No    Sexually Abused: No     PHYSICAL EXAM  There were no vitals filed for this visit.   There is no height or weight on file to calculate BMI.   General: The patient is well-developed and well-nourished and in no  acute distress.     Neck/HEENT:   She has mild tenderness in the cervical paraspinal muscles.      Extremities:  Mild pedal edema   Neurologic Exam  Mental status: The patient is alert and oriented x 3 at the time of the examination. The patient has apparent normal recent and remote memory, with an apparently normal attention span and concentration ability.   Speech is normal.  Cranial nerves: Extraocular movements are full.  . She reports reduced sensation in the right face.  Facial strength was normal.  The trapezius strength is normal.      Motor:  Muscle bulk is normal.   Tone is normal in arms and mildly increased in the legs..  Strength is 5/5 in the arms and legs.  Sensory: She has symmetric sensation to touch and vibration in the arms and legs/feet.  Coordination: Cerebellar testing shows good finger-nose-finger and right heel-to-shin but mildly reduced left heel-to-shin (but has knee pain).  Gait and station: Station is normal.  The gait is wide and arthritic.   She is unable to do a tandem gait.   Romberg is negative..  Reflexes: Deep tendon reflexes are symmetric and mildly increased in legs bilaterally.   No ankle clonus      ASSESSMENT AND PLAN  No diagnosis found.   1.    For MS, continue Ocrevus, next infusion in March 2025.  IgG/IgM were fine 03/2023. 2.    Continue baclofen, Provigil and oxybutynin. 3.    MRI brain to determine if any progression and consider a different DMT if occurring 4.   We discussed a grab bar for her shower. 5.   She will return to see [place] in 6 months or sooner if she has new or worsening neurologic symptoms.    Amaura Authier A. Epimenio Foot, MD, PhD, FAAN Certified in Neurology, Clinical Neurophysiology, Sleep Medicine, Pain Medicine and Neuroimaging Director, [facility]  [facility] [address] [place], [place] [address] [phone]

## 2023-08-12 ENCOUNTER — Ambulatory Visit: Payer: Federal, State, Local not specified - PPO | Admitting: Neurology

## 2023-08-12 ENCOUNTER — Encounter: Payer: Self-pay | Admitting: Neurology

## 2023-08-12 ENCOUNTER — Encounter: Payer: Self-pay | Admitting: Medical

## 2023-08-12 VITALS — BP 101/67 | HR 68 | Ht 64.0 in | Wt 155.5 lb

## 2023-08-12 DIAGNOSIS — R260 Ataxic gait: Secondary | ICD-10-CM

## 2023-08-12 DIAGNOSIS — R5383 Other fatigue: Secondary | ICD-10-CM | POA: Diagnosis not present

## 2023-08-12 DIAGNOSIS — G8929 Other chronic pain: Secondary | ICD-10-CM

## 2023-08-12 DIAGNOSIS — R3915 Urgency of urination: Secondary | ICD-10-CM

## 2023-08-12 DIAGNOSIS — G35 Multiple sclerosis: Secondary | ICD-10-CM | POA: Diagnosis not present

## 2023-08-12 DIAGNOSIS — Z79899 Other long term (current) drug therapy: Secondary | ICD-10-CM

## 2023-08-14 LAB — VITAMIN B1: Vitamin B1 (Thiamine): 21 nmol/L (ref 8–30)

## 2023-08-17 ENCOUNTER — Encounter: Payer: Self-pay | Admitting: Medical

## 2023-08-17 ENCOUNTER — Encounter: Payer: Self-pay | Admitting: Neurology

## 2023-08-17 DIAGNOSIS — I639 Cerebral infarction, unspecified: Secondary | ICD-10-CM

## 2023-08-20 NOTE — Telephone Encounter (Signed)
 I called with the results of the MRI.  I got voicemail left message.  I will try to call later this week.  I took a look at the last MRI and compared it to her previous 2 or 3 MRIs.  The 1 focus felt to be a stroke probably represents a larger MS lesion and appears to be present on previous studies.  Another focus in the left temporal lobe is more consistent with stroke.  It can be seen on a CT scan from 2023 but was not present on MRI from earlier.  I was going to discuss these results and recommend that we check an MRI of the head and neck to determine if there is any significant stenosis that could be playing a role.  Additionally, consider rhythm monitor.

## 2023-08-22 NOTE — Telephone Encounter (Signed)
 I called again and got voicemail.  I will try to reach her again later this week.

## 2023-08-24 ENCOUNTER — Ambulatory Visit
Admission: EM | Admit: 2023-08-24 | Discharge: 2023-08-24 | Disposition: A | Discharge status: Home / Self Care | Payer: Federal, State, Local not specified - PPO | Attending: Family Medicine | Admitting: Family Medicine

## 2023-08-24 DIAGNOSIS — R350 Frequency of micturition: Secondary | ICD-10-CM | POA: Diagnosis not present

## 2023-08-24 DIAGNOSIS — N3001 Acute cystitis with hematuria: Secondary | ICD-10-CM

## 2023-08-24 LAB — POCT URINALYSIS DIP (MANUAL ENTRY)
Bilirubin, UA: NEGATIVE
Glucose, UA: NEGATIVE mg/dL
Ketones, POC UA: NEGATIVE mg/dL
Nitrite, UA: POSITIVE — AB
Protein Ur, POC: NEGATIVE mg/dL
Spec Grav, UA: 1.015 (ref 1.010–1.025)
Urobilinogen, UA: 0.2 U/dL
pH, UA: 6.5 (ref 5.0–8.0)

## 2023-08-24 MED ORDER — CEPHALEXIN 500 MG PO CAPS: 500.0000 mg | ORAL_CAPSULE | Freq: Two times a day (BID) | ORAL | 0 refills | Status: DC

## 2023-08-24 NOTE — ED Triage Notes (Signed)
 Patient presents with symptoms of low abdomen pain, frequent urination, cloudy urine and vaginal odor x day 4.

## 2023-08-24 NOTE — Discharge Instructions (Signed)
Please start cephalexin to address an urinary tract infection. Make sure you hydrate very well with plain water and a quantity of 64 ounces of water a day.  Please limit drinks that are considered urinary irritants such as soda, sweet tea, coffee, energy drinks, alcohol.  These can worsen your urinary and genital symptoms but also be the source of them.  I will let you know about your urine culture results through MyChart to see if we need to prescribe or change your antibiotics based off of those results.

## 2023-08-24 NOTE — ED Provider Notes (Signed)
 [facility]  Note:  This document was prepared using Conservation officer, historic buildings and may include unintentional dictation errors.  MRN: [id number] DOB: 10/14/1960  Subjective:   Catherine Hubbard is a 63 y.o. female presenting for 4-day history of recurrent urinary frequency, cloudy urine, lower abdominal pelvic pressure, malodorous urine.  Has a history of UTIs.  Was treated for one and December.  Urine culture was equivocal.  No fever, frank hematuria, flank pain, CVA tenderness.   Current Facility-Administered Medications:    sodium chloride  (PF) 0.9 % injection 2 mL, 2 mL, Intravenous, PRN,   Current Outpatient Medications:    acetaminophen  (TYLENOL ) 500 MG tablet, Take 2 tablets (1,000 mg total) by mouth every 6 (six) hours as needed for mild pain (pain score 1-3)., Disp: , Rfl:    albuterol  (PROVENTIL ) (2.5 MG/3ML) 0.083% nebulizer solution, Take 3 mLs (2.5 mg total) by nebulization every 6 (six) hours as needed for wheezing or shortness of breath., Disp: 150 mL, Rfl: 0   albuterol  (VENTOLIN  HFA) 108 (90 Base) MCG/ACT inhaler, Inhale 2 puffs into the lungs every 6 (six) hours as needed for wheezing or shortness of breath., Disp: 18 g, Rfl: 1   Ascorbic Acid (VITAMIN C PO), Take 1 tablet by mouth daily. 1 tablet daily, Disp: , Rfl:    B Complex Vitamins (VITAMIN B COMPLEX PO), Take 1 tablet by mouth daily. 1 daily, Disp: , Rfl:    baclofen  (LIORESAL ) 10 MG tablet, TAKE 1 TABLET BY MOUTH THREE TIMES A DAY AS NEEDED FOR MUSCLE SPASMS (Patient taking differently: Take 10 mg by mouth 3 (three) times daily as needed for muscle spasms.), Disp: 270 tablet, Rfl: 0   benzonatate  (TESSALON ) 100 MG capsule, Take 1 capsule (100 mg total) by mouth 3 (three) times daily as needed for cough., Disp: 30 capsule, Rfl: 0   bismuth  subsalicylate (PEPTO BISMOL) 262 MG/15ML suspension, Take 30 mLs by mouth daily as needed for indigestion or diarrhea or loose stools (acid  reflux)., Disp: , Rfl:    budesonide -formoterol  (SYMBICORT ) 160-4.5 MCG/ACT inhaler, Inhale 2 puffs into the lungs 2 (two) times daily., Disp: 1 each, Rfl: 3   budesonide -formoterol  (SYMBICORT ) 80-4.5 MCG/ACT inhaler, TAKE 2 PUFFS BY MOUTH TWICE A DAY (Patient taking differently: Inhale 2 puffs into the lungs 2 (two) times daily.), Disp: 30.6 each, Rfl: 1   busPIRone  (BUSPAR ) 7.5 MG tablet, Take 1 tablet (7.5 mg total) by mouth 2 (two) times daily., Disp: 60 tablet, Rfl: 0   Calcium  Carbonate Antacid (TUMS PO), Take 2 tablets by mouth 2 (two) times daily as needed (acid reflux)., Disp: , Rfl:    cephALEXin  (KEFLEX ) 500 MG capsule, Take 1 capsule (500 mg total) by mouth 4 (four) times daily. (Patient not taking: Reported on 08/12/2023), Disp: 28 capsule, Rfl: 0   Cholecalciferol (D2000 ULTRA STRENGTH) 50 MCG (2000 UT) CAPS, Take 2,000 Units by mouth daily., Disp: , Rfl:    diclofenac  Sodium (VOLTAREN ) 1 % GEL, Apply 4 g topically 4 (four) times daily. (Patient taking differently: Apply 4 g topically 4 (four) times daily as needed (joint pain).), Disp: 400 g, Rfl: 1   fluconazole  (DIFLUCAN ) 150 MG tablet, Take 1 tablet (150 mg total) by mouth daily., Disp: 1 tablet, Rfl: 0   fluticasone  (FLONASE ) 50 MCG/ACT nasal spray, SPRAY 2 SPRAYS INTO EACH NOSTRIL EVERY DAY (Patient taking differently: Place 2 sprays into both nostrils once as needed for allergies.), Disp: 48 mL, Rfl: 3   fluticasone  (  FLONASE ) 50 MCG/ACT nasal spray, Place 2 sprays into both nostrils daily. (Patient not taking: Reported on 08/12/2023), Disp: 16 g, Rfl: 1   furosemide  (LASIX ) 40 MG tablet, Take 1 tablet (40 mg total) by mouth daily., Disp: 90 tablet, Rfl: 3   gabapentin  (NEURONTIN ) 100 MG capsule, TAKE 1 CAPSULE BY MOUTH THREE TIMES A DAY (Patient taking differently: Take 100 mg by mouth 3 (three) times daily as needed (nerve pain).), Disp: 90 capsule, Rfl: 1   HYDROcodone -acetaminophen  (NORCO) 5-325 MG tablet, 1 tab po q 6 hours prn  severe pain (Patient taking differently: Take 1 tablet by mouth every 6 (six) hours as needed for moderate pain (pain score 4-6) or severe pain (pain score 7-10). 1 tab po q 6 hours prn severe pain), Disp: 30 tablet, Rfl: 0   Iron , Ferrous Sulfate , 325 (65 Fe) MG TABS, 1 tab po bid (Patient taking differently: Take 1 tablet by mouth in the morning and at bedtime. 1 tab po bid), Disp: 60 tablet, Rfl: 3   modafinil  (PROVIGIL ) 200 MG tablet, Take one po qAM and 1/2 po qPM (Patient taking differently: Take 250 mg by mouth daily. One and one-half tablets by mouth before noon.), Disp: 45 tablet, Rfl: 5   Multiple Vitamin (MULTIVITAMIN WITH MINERALS) TABS tablet, Take 1 tablet by mouth daily., Disp: , Rfl:    nystatin  cream (MYCOSTATIN ), Apply 1 Application topically 2 (two) times daily. (Patient taking differently: Apply 1 Application topically 2 (two) times daily as needed for dry skin.), Disp: 30 g, Rfl: 1   ocrelizumab  (OCREVUS ) 300 MG/10ML injection, INFUSE 600MG  INTRAVENOUSLY AS DIRECTED EVERY 6 MONTHS. (Patient taking differently: Inject 600 mg into the vein every 6 (six) months.), Disp: 20 mL, Rfl: 1   olmesartan  (BENICAR ) 5 MG tablet, 2 tab po q day (Patient taking differently: Take 10 mg by mouth daily. 2 tab po q day), Disp: 180 tablet, Rfl: 3   ondansetron  (ZOFRAN ) 4 MG tablet, Take 1 tablet (4 mg total) by mouth every 6 (six) hours., Disp: 12 tablet, Rfl: 0   oxybutynin  (DITROPAN ) 5 MG tablet, TAKE 1 TABLET BY MOUTH TWICE A DAY, Disp: 180 tablet, Rfl: 0   pantoprazole  (PROTONIX ) 40 MG tablet, Take 1 tablet (40 mg total) by mouth daily., Disp: 30 tablet, Rfl: 0   pantoprazole  (PROTONIX ) 40 MG tablet, Take 1 tablet (40 mg total) by mouth daily., Disp: 30 tablet, Rfl: 3   potassium chloride  SA (KLOR-CON  M) 20 MEQ tablet, Take 1 tablet (20 mEq total) by mouth daily., Disp: 90 tablet, Rfl: 3   Probiotic Product (PROBIOTIC DAILY PO), Take 1 tablet by mouth 3 (three) times a week. 1 daily, Disp: , Rfl:     traMADol  (ULTRAM ) 50 MG tablet, 1 tab po bid prn severe pain (Patient not taking: Reported on 08/12/2023), Disp: 10 tablet, Rfl: 0   Allergies  Allergen Reactions   Ibuprofen  Other (See Comments)    Cannot take due to gastric bypass   Nsaids Other (See Comments)    Can not take due to Gastric bypass    Past Medical History:  Diagnosis Date   Alcohol abuse    Asthma    Esophagitis    GERD (gastroesophageal reflux disease)    Headache    Hearing loss    Hypertension    Multiple sclerosis (HCC)    Pancreatitis    Vision abnormalities      Past Surgical History:  Procedure Laterality Date   ANKLE FRACTURE SURGERY Bilateral  CARPAL TUNNEL RELEASE Bilateral    CHOLECYSTECTOMY     COLONOSCOPY  11/2016   multiple   ESOPHAGOGASTRODUODENOSCOPY  11/2016   ESOPHAGOGASTRODUODENOSCOPY N/A 02/01/2017   Procedure: ESOPHAGOGASTRODUODENOSCOPY (EGD);  Surgeon: Avram Lupita BRAVO, MD;  Location: [facility] ENDOSCOPY;  Service: Endoscopy;  Laterality: N/A;   ESOPHAGOGASTRODUODENOSCOPY (EGD) WITH PROPOFOL  N/A 02/04/2017   Procedure: ESOPHAGOGASTRODUODENOSCOPY (EGD) WITH PROPOFOL ;  Surgeon: Aneita Gwendlyn DASEN, MD;  Location: [facility] ENDOSCOPY;  Service: Endoscopy;  Laterality: N/A;   LAPAROSCOPIC GASTRIC SLEEVE RESECTION  2011   ULNAR NERVE REPAIR Bilateral     Family History  Problem Relation Age of Onset   Cancer Mother    Stroke Mother    Cancer Father     Social History   Tobacco Use   Smoking status: Never   Smokeless tobacco: Never   Tobacco comments:    second hand smoker when young. Heavy exposure.  Vaping Use   Vaping status: Never Used  Substance Use Topics   Alcohol use: Yes    Alcohol/week: 6.0 standard drinks of alcohol    Types: 6 Cans of beer per week    Comment: daily, couple of shots ( weekly depending on day)   Drug use: No    ROS   Objective:   Vitals: BP 138/82 (BP Location: Right Arm)   Pulse 68   Temp 97.7 F (36.5 C) (Oral) Comment: drank cold water  Resp 16    Ht 5' 3 (1.6 m)   Wt 154 lb (69.9 kg)   LMP 08/13/2014 Comment: very few periods  SpO2 94%   BMI 27.28 kg/m   Physical Exam Constitutional:      General: She is not in acute distress.    Appearance: Normal appearance. She is well-developed. She is not ill-appearing, toxic-appearing or diaphoretic.  HENT:     Head: Normocephalic and atraumatic.     Nose: Nose normal.     Mouth/Throat:     Mouth: Mucous membranes are moist.     Pharynx: Oropharynx is clear.  Eyes:     General: No scleral icterus.       Right eye: No discharge.        Left eye: No discharge.     Extraocular Movements: Extraocular movements intact.     Conjunctiva/sclera: Conjunctivae normal.  Cardiovascular:     Rate and Rhythm: Normal rate.  Pulmonary:     Effort: Pulmonary effort is normal.  Abdominal:     General: Bowel sounds are normal. There is no distension.     Palpations: Abdomen is soft. There is no mass.     Tenderness: There is no abdominal tenderness. There is no right CVA tenderness, left CVA tenderness, guarding or rebound.  Skin:    General: Skin is warm and dry.  Neurological:     General: No focal deficit present.     Mental Status: She is alert and oriented to person, place, and time.  Psychiatric:        Mood and Affect: Mood normal.        Behavior: Behavior normal.        Thought Content: Thought content normal.        Judgment: Judgment normal.     Results for orders placed or performed during the hospital encounter of 08/24/23 (from the past 24 hours)  POCT urinalysis dipstick     Status: Abnormal   Collection Time: 08/24/23  9:45 AM  Result Value Ref Range   Color, UA yellow yellow  Clarity, UA cloudy (A) clear   Glucose, UA negative negative mg/dL   Bilirubin, UA negative negative   Ketones, POC UA negative negative mg/dL   Spec Grav, UA 8.984 8.989 - 1.025   Blood, UA trace-intact (A) negative   pH, UA 6.5 5.0 - 8.0   Protein Ur, POC negative negative mg/dL    Urobilinogen, UA 0.2 0.2 or 1.0 E.U./dL   Nitrite, UA Positive (A) Negative   Leukocytes, UA Small (1+) (A) Negative    Assessment and Plan :   PDMP not reviewed this encounter.  1. Acute cystitis with hematuria   2. Urinary frequency    Start cephalexin  to cover for acute cystitis, urine culture pending.  Recommended aggressive hydration, limiting urinary irritants. Counseled patient on potential for adverse effects with medications prescribed/recommended today, ER and return-to-clinic precautions discussed, patient verbalized understanding.    Christopher Savannah, [place] 08/24/23 [phone]

## 2023-08-26 LAB — URINE CULTURE: Culture: >=100000 — AB

## 2023-08-26 NOTE — Addendum Note (Signed)
 Addended by: Despina Arias A on: 08/26/2023 04:10 PM   Modules accepted: Orders

## 2023-08-26 NOTE — Telephone Encounter (Signed)
 I spoke to her about the imaging results.  The left temporal lobe stroke did not appear to be present on the CT scan from March 2020 though can be seen on the studies from 2023 on.  I advised her to take a baby aspirin a day.  We will check an MR angiogram of the neck and head to determine if there is any blockage that will require intervention.

## 2023-08-26 NOTE — Telephone Encounter (Signed)
Pt returning call. Requesting call back.  

## 2023-08-27 ENCOUNTER — Other Ambulatory Visit (HOSPITAL_BASED_OUTPATIENT_CLINIC_OR_DEPARTMENT_OTHER): Payer: Self-pay

## 2023-08-27 MED ORDER — COVID-19 MRNA VAC-TRIS(PFIZER) 30 MCG/0.3ML IM SUSY
0.3000 mL | PREFILLED_SYRINGE | Freq: Once | INTRAMUSCULAR | 0 refills | Status: AC
  Filled 2023-08-27: qty 0.3, 1d supply, fill #0

## 2023-08-27 MED ORDER — INFLUENZA VIRUS VACC SPLIT PF (FLUZONE) 0.5 ML IM SUSY
0.5000 mL | PREFILLED_SYRINGE | Freq: Once | INTRAMUSCULAR | 0 refills | Status: AC
  Filled 2023-08-27: qty 0.5, 1d supply, fill #0

## 2023-08-28 ENCOUNTER — Encounter: Payer: Self-pay | Admitting: Neurology

## 2023-08-28 ENCOUNTER — Telehealth: Payer: Self-pay | Admitting: Neurology

## 2023-08-28 ENCOUNTER — Other Ambulatory Visit: Payer: Self-pay | Admitting: Neurology

## 2023-08-28 MED ORDER — ALPRAZOLAM 0.5 MG PO TABS: ORAL_TABLET | ORAL | 0 refills | Status: DC

## 2023-08-28 NOTE — Telephone Encounter (Signed)
 Pt states she was told to reach out before her MRI to have medication sent for her claustrophobia. MRI is scheduled for 09/11/23

## 2023-08-29 ENCOUNTER — Other Ambulatory Visit: Payer: Federal, State, Local not specified - PPO

## 2023-08-29 ENCOUNTER — Ambulatory Visit: Payer: Federal, State, Local not specified - PPO | Admitting: Family Medicine

## 2023-08-29 ENCOUNTER — Encounter: Payer: Self-pay | Admitting: Family Medicine

## 2023-08-29 ENCOUNTER — Ambulatory Visit: Payer: Federal, State, Local not specified - PPO | Admitting: Hematology & Oncology

## 2023-08-29 VITALS — BP 90/62 | Ht 63.0 in | Wt 154.0 lb

## 2023-08-29 DIAGNOSIS — S83289A Other tear of lateral meniscus, current injury, unspecified knee, initial encounter: Secondary | ICD-10-CM

## 2023-08-29 DIAGNOSIS — M1712 Unilateral primary osteoarthritis, left knee: Secondary | ICD-10-CM | POA: Diagnosis not present

## 2023-08-29 DIAGNOSIS — M25562 Pain in left knee: Secondary | ICD-10-CM

## 2023-08-29 DIAGNOSIS — G8929 Other chronic pain: Secondary | ICD-10-CM

## 2023-08-29 DIAGNOSIS — M25462 Effusion, left knee: Secondary | ICD-10-CM | POA: Diagnosis not present

## 2023-08-29 DIAGNOSIS — S83242D Other tear of medial meniscus, current injury, left knee, subsequent encounter: Secondary | ICD-10-CM | POA: Diagnosis not present

## 2023-08-29 NOTE — Progress Notes (Signed)
 CHIEF COMPLAINT: No chief complaint on file.  _____________________________________________________________ SUBJECTIVE  HPI  Pt is a 63 y.o. female here for follow-up, review MRI  Last seen 06/26/2023 for persistent left knee pain and swelling.  Note reviewed: -Has had several aspiration and CS injections, offering temporary relief but swelling returns.  Prior x-rays with only moderate degenerative changes.  Knee CT showed effusion, no acute fracture -Exam yielded concern for meniscal tear, MRI knee was ordered at that time  MRI knee completed 08/11/2023: MENISCI Medial meniscus:  Horizontal tear of the body and posterior horn. Lateral meniscus:  Small radial tear of the midbody.   LIGAMENTS Cruciates:  Intact ACL and PCL. Collaterals: Medial collateral ligament is intact. Lateral collateral ligament complex is intact.   CARTILAGE Patellofemoral: Extensive full-thickness cartilage loss over the patella and trochlea. Medial: Diffuse cartilage thinning with scattered areas of partial-thickness cartilage loss. Lateral: Diffuse cartilage thinning. Focal near full-thickness cartilage defect over the central weight-bearing lateral femoral condyle. Joint: Small to moderate joint effusion. 4 mm intra-articular body in the posterior joint space (series 8, image 16).  Popliteal Fossa:  No Baker cyst. Intact popliteus tendon. Extensor Mechanism: Intact quadriceps tendon and patellar tendon. Intact medial and lateral patellar retinaculum. Intact MPFL. Bones: No acute fracture or dislocation. No suspicious bone lesion. Ganglion cyst near the insertion of the medial gastrocnemius tendon with reactive marrow edema in the adjacent posterior medial femoral condyle.   IMPRESSION: 1. Horizontal tear of the medial meniscus body and posterior horn. 2. Small radial tear of the lateral meniscus midbody. 3. 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 4. Small to moderate  joint effusion with 4 mm intra-articular body in the posterior joint space. -- Since her visit, swelling has returned, but not as significant as before Primarily located around the knee cap Pain feels like burning/throbbing. Other times when transitioning/rolling over in bed, or puts her knee on the bed pain will be much more severe  Sleeps with a pillow in between her knees When she rolls onto her left side, if there is anything on her feet such as booties and it would cause some drag the pain would exacerbate the pain and cause severe pain  Pain will come and go Feels like pain is worsening Joint giving way, feels like it can catch/lock, can hear clicking Feels that ADLs have not been functional despite using a cane. Ambulation has been very painful. Overall feels that knee is unstable even when she has no pain Therapies tried: PT was going well for some time but got to a point where the pain was actually getting worse. Has not been doing PT since. Also using voltaren  gel. Somet days medications seem to help, other times it does not seem to do anything. Has been using 1/2 tab norco provided by PCP  ------------------------------------------------------------------------------------------------------ Past Medical History:  Diagnosis Date   Alcohol abuse    Asthma    Esophagitis    GERD (gastroesophageal reflux disease)    Headache    Hearing loss    Hypertension    Multiple sclerosis (HCC)    Pancreatitis    Vision abnormalities     Past Surgical History:  Procedure Laterality Date   ANKLE FRACTURE SURGERY Bilateral    CARPAL TUNNEL RELEASE Bilateral    CHOLECYSTECTOMY     COLONOSCOPY  11/2016   multiple   ESOPHAGOGASTRODUODENOSCOPY  11/2016   ESOPHAGOGASTRODUODENOSCOPY N/A 02/01/2017   Procedure: ESOPHAGOGASTRODUODENOSCOPY (EGD);  Surgeon: Avram Lupita BRAVO, MD;  Location: [facility] ENDOSCOPY;  Service: Endoscopy;  Laterality: N/A;   ESOPHAGOGASTRODUODENOSCOPY (EGD) WITH PROPOFOL  N/A  02/04/2017   Procedure: ESOPHAGOGASTRODUODENOSCOPY (EGD) WITH PROPOFOL ;  Surgeon: Aneita Gwendlyn DASEN, MD;  Location: [facility] ENDOSCOPY;  Service: Endoscopy;  Laterality: N/A;   LAPAROSCOPIC GASTRIC SLEEVE RESECTION  2011   ULNAR NERVE REPAIR Bilateral       Outpatient Encounter Medications as of 08/29/2023  Medication Sig   acetaminophen  (TYLENOL ) 500 MG tablet Take 2 tablets (1,000 mg total) by mouth every 6 (six) hours as needed for mild pain (pain score 1-3).   albuterol  (PROVENTIL ) (2.5 MG/3ML) 0.083% nebulizer solution Take 3 mLs (2.5 mg total) by nebulization every 6 (six) hours as needed for wheezing or shortness of breath.   albuterol  (VENTOLIN  HFA) 108 (90 Base) MCG/ACT inhaler Inhale 2 puffs into the lungs every 6 (six) hours as needed for wheezing or shortness of breath.   ALPRAZolam  (XANAX ) 0.5 MG tablet Take 2 or 3 pills before the MRI.  You must have a driver.   Ascorbic Acid (VITAMIN C PO) Take 1 tablet by mouth daily. 1 tablet daily   B Complex Vitamins (VITAMIN B COMPLEX PO) Take 1 tablet by mouth daily. 1 daily   baclofen  (LIORESAL ) 10 MG tablet TAKE 1 TABLET BY MOUTH THREE TIMES A DAY AS NEEDED FOR MUSCLE SPASMS (Patient taking differently: Take 10 mg by mouth 3 (three) times daily as needed for muscle spasms.)   benzonatate  (TESSALON ) 100 MG capsule Take 1 capsule (100 mg total) by mouth 3 (three) times daily as needed for cough.   bismuth  subsalicylate (PEPTO BISMOL) 262 MG/15ML suspension Take 30 mLs by mouth daily as needed for indigestion or diarrhea or loose stools (acid reflux).   budesonide -formoterol  (SYMBICORT ) 160-4.5 MCG/ACT inhaler Inhale 2 puffs into the lungs 2 (two) times daily.   budesonide -formoterol  (SYMBICORT ) 80-4.5 MCG/ACT inhaler TAKE 2 PUFFS BY MOUTH TWICE A DAY (Patient taking differently: Inhale 2 puffs into the lungs 2 (two) times daily.)   busPIRone  (BUSPAR ) 7.5 MG tablet Take 1 tablet (7.5 mg total) by mouth 2 (two) times daily.   Calcium  Carbonate Antacid  (TUMS PO) Take 2 tablets by mouth 2 (two) times daily as needed (acid reflux).   cephALEXin  (KEFLEX ) 500 MG capsule Take 1 capsule (500 mg total) by mouth 2 (two) times daily.   Cholecalciferol (D2000 ULTRA STRENGTH) 50 MCG (2000 UT) CAPS Take 2,000 Units by mouth daily.   diclofenac  Sodium (VOLTAREN ) 1 % GEL Apply 4 g topically 4 (four) times daily. (Patient taking differently: Apply 4 g topically 4 (four) times daily as needed (joint pain).)   fluconazole  (DIFLUCAN ) 150 MG tablet Take 1 tablet (150 mg total) by mouth daily.   fluticasone  (FLONASE ) 50 MCG/ACT nasal spray SPRAY 2 SPRAYS INTO EACH NOSTRIL EVERY DAY (Patient taking differently: Place 2 sprays into both nostrils once as needed for allergies.)   fluticasone  (FLONASE ) 50 MCG/ACT nasal spray Place 2 sprays into both nostrils daily. (Patient not taking: Reported on 08/12/2023)   furosemide  (LASIX ) 40 MG tablet Take 1 tablet (40 mg total) by mouth daily.   gabapentin  (NEURONTIN ) 100 MG capsule TAKE 1 CAPSULE BY MOUTH THREE TIMES A DAY (Patient taking differently: Take 100 mg by mouth 3 (three) times daily as needed (nerve pain).)   HYDROcodone -acetaminophen  (NORCO) 5-325 MG tablet 1 tab po q 6 hours prn severe pain (Patient taking differently: Take 1 tablet by mouth every 6 (six) hours as needed for moderate pain (pain score 4-6) or severe pain (pain score 7-10).  1 tab po q 6 hours prn severe pain)   Iron , Ferrous Sulfate , 325 (65 Fe) MG TABS 1 tab po bid (Patient taking differently: Take 1 tablet by mouth in the morning and at bedtime. 1 tab po bid)   modafinil  (PROVIGIL ) 200 MG tablet Take one po qAM and 1/2 po qPM (Patient taking differently: Take 250 mg by mouth daily. One and one-half tablets by mouth before noon.)   Multiple Vitamin (MULTIVITAMIN WITH MINERALS) TABS tablet Take 1 tablet by mouth daily.   nystatin  cream (MYCOSTATIN ) Apply 1 Application topically 2 (two) times daily. (Patient taking differently: Apply 1 Application  topically 2 (two) times daily as needed for dry skin.)   ocrelizumab  (OCREVUS ) 300 MG/10ML injection INFUSE 600MG  INTRAVENOUSLY AS DIRECTED EVERY 6 MONTHS. (Patient taking differently: Inject 600 mg into the vein every 6 (six) months.)   olmesartan  (BENICAR ) 5 MG tablet 2 tab po q day (Patient taking differently: Take 10 mg by mouth daily. 2 tab po q day)   ondansetron  (ZOFRAN ) 4 MG tablet Take 1 tablet (4 mg total) by mouth every 6 (six) hours.   oxybutynin  (DITROPAN ) 5 MG tablet TAKE 1 TABLET BY MOUTH TWICE A DAY   pantoprazole  (PROTONIX ) 40 MG tablet Take 1 tablet (40 mg total) by mouth daily.   pantoprazole  (PROTONIX ) 40 MG tablet Take 1 tablet (40 mg total) by mouth daily.   potassium chloride  SA (KLOR-CON  M) 20 MEQ tablet Take 1 tablet (20 mEq total) by mouth daily.   Probiotic Product (PROBIOTIC DAILY PO) Take 1 tablet by mouth 3 (three) times a week. 1 daily   traMADol  (ULTRAM ) 50 MG tablet 1 tab po bid prn severe pain (Patient not taking: Reported on 08/12/2023)   Facility-Administered Encounter Medications as of 08/29/2023  Medication   sodium chloride  (PF) 0.9 % injection 2 mL    ------------------------------------------------------------------------------------------------------  _____________________________________________________________ OBJECTIVE  PHYSICAL EXAM  Today's Vitals   08/29/23 0933  BP: 90/62  Weight: 154 lb (69.9 kg)  Height: 5' 3 (1.6 m)   There is no height or weight on file to calculate BMI.   reviewed  General: A+Ox3, no acute distress, well-nourished, appropriate affect CV: pulses 2+ regular, nondiaphoretic, no peripheral edema, cap refill <2sec Lungs: no audible wheezing, non-labored breathing, bilateral chest rise/fall, nontachypneic Skin: warm, well-perfused, non-icteric, no susp lesions or rashes Neuro: Sensation intact, muscle tone wnl, no atrophy Psych: no signs of depression or anxiety MSK:   L Knee: Visible effusion, + fluid  wave Limited flexion/extension NTTP over the quad tendon, medial fem condyle, lat fem condyle, tibial tuberostiy, fibular head, pes anserine bursa, gerdy's tubercle TTP medial jt line, lateral jt line, patella, posterior fossa, negative homan Neg anterior and posterior drawer Neg lachman Neg sag sign Negative varus stress Negative valgus stress Gait antalgic  _____________________________________________________________ ASSESSMENT/PLAN Diagnoses and all orders for this visit:  Radial tear of lateral meniscus Other tear of medial meniscus of left knee, unspecified whether old or current tear, subsequent encounter Osteoarthritis of left knee, unspecified osteoarthritis type Knee effusion, left Chronic pain of left knee -     Ambulatory referral to Orthopedic Surgery  Majority of today's visit discussing MRI findings, nature of patient's meniscal pathology. Mary-Jo expresses concern regarding progressive worsening of ADLs, persistent knee effusion, poor quality of life and concerns of falling, and is interested in speaking with orthopedics to explore surgical options for management. Referral sent today. All questions answered. Return precautions discussed. Patient verbalized understanding and is in agreement with  plan. Anticipate return to clinic after orthopedics consult  Electronically signed by: Benedict LELON Bumps, MD 08/29/2023 7:41 AM

## 2023-08-30 ENCOUNTER — Other Ambulatory Visit: Payer: Self-pay | Admitting: Medical

## 2023-09-03 DIAGNOSIS — M25562 Pain in left knee: Secondary | ICD-10-CM | POA: Diagnosis not present

## 2023-09-10 DIAGNOSIS — M1712 Unilateral primary osteoarthritis, left knee: Secondary | ICD-10-CM | POA: Diagnosis not present

## 2023-09-11 ENCOUNTER — Ambulatory Visit (HOSPITAL_BASED_OUTPATIENT_CLINIC_OR_DEPARTMENT_OTHER)
Admission: RE | Admit: 2023-09-11 | Discharge: 2023-09-11 | Disposition: A | Discharge status: Home / Self Care | Payer: Federal, State, Local not specified - PPO | Source: Ambulatory Visit | Attending: Neurology | Admitting: Neurology

## 2023-09-11 DIAGNOSIS — I639 Cerebral infarction, unspecified: Secondary | ICD-10-CM | POA: Diagnosis not present

## 2023-09-11 DIAGNOSIS — H539 Unspecified visual disturbance: Secondary | ICD-10-CM | POA: Diagnosis not present

## 2023-09-11 MED ORDER — GADOBUTROL 1 MMOL/ML IV SOLN
7.0000 mL | Freq: Once | INTRAVENOUS | Status: AC | PRN
  Administered 2023-09-11: 7 mL via INTRAVENOUS

## 2023-09-12 ENCOUNTER — Other Ambulatory Visit: Payer: Self-pay | Admitting: Medical

## 2023-09-16 ENCOUNTER — Ambulatory Visit: Payer: Self-pay | Admitting: Medical

## 2023-09-16 NOTE — Telephone Encounter (Signed)
Copied from CRM [id number]. Topic: Clinical - Red Word Triage >> Sep 16, 2023 12:09 PM Catherine Hubbard wrote: Red Word that prompted transfer to Nurse Triage: Patient is experiencing UTI symptoms, currently feeling dizzy and lightheaded. Present since Friday.   Chief Complaint: UTI Symptoms: burning with urination, malodorous urine, dizzy/lightheaded Pertinent Negatives: Patient denies blood in urine Disposition: [] ED /[] Urgent Care (no appt availability in office) / [x] Appointment(In office/virtual)/ []  [facility]/ [] Home Care/ [] Refused Recommended Disposition /[] [facility]/ []  Follow-up with PCP Additional Notes: Patient reported having UTI symptoms and dizziness. She stated the UTI is a recurrent problem. She was just treated for a UTI a couple weeks ago by urgent care. Her symptoms resolved for about a week and then returned on Friday. Patient also stated that she has mild dizziness which began yesterday. Offered appt for today different provider, and patient declined because she only wants to see Dr. Alvira Monday. Appointment scheduled for 1/28.   Reason for Disposition  [1] MODERATE dizziness (e.g., interferes with normal activities) AND [2] has been evaluated by doctor (or NP/PA) for this  Bad or foul-smelling urine  Answer Assessment - Initial Assessment Questions 1. SYMPTOM: "What's the main symptom you're concerned about?" (e.g., frequency, incontinence) Burning when urinating      2. ONSET: "When did the  symptoms  start?"     Friday  3. CAUSE: "What do you think is causing the symptoms?"     Possible UTI  4. OTHER SYMPTOMS: "Do you have any other symptoms?" (e.g., blood in urine, fever, flank pain, pain with urination)     Lightheaded/dizzy, urine odor smells like meat  Answer Assessment - Initial Assessment Questions 1. DESCRIPTION: "Describe your dizziness."     Mild dizziness  2. LIGHTHEADED: "Do you feel lightheaded?" (e.g., somewhat faint, woozy, weak  upon standing)     Yes  3. SEVERITY: "How bad is it?"  "Do you feel like you are going to faint?" "Can you stand and walk?"   - MILD: Feels slightly dizzy, but walking normally.   - MODERATE: Feels unsteady when walking, but not falling; interferes with normal activities (e.g., school, work).   - SEVERE: Unable to walk without falling, or requires assistance to walk without falling; feels like passing out now.      Mild dizziness. Uses the wall for support when walking but does not feel faint.  4. ONSET:  "When did the dizziness begin?"     Yesterday  5. AGGRAVATING FACTORS: "Does anything make it worse?" (e.g., standing, change in head position)     Worse with standing and walking around  Protocols used: Urinary Symptoms-A-AH, Dizziness - Lightheadedness-A-AH

## 2023-09-16 NOTE — Telephone Encounter (Signed)
No openings today  Pt called and lvm to return call to advise ED or UC

## 2023-09-17 ENCOUNTER — Encounter: Payer: Self-pay | Admitting: Medical

## 2023-09-17 ENCOUNTER — Ambulatory Visit (HOSPITAL_BASED_OUTPATIENT_CLINIC_OR_DEPARTMENT_OTHER)
Admission: RE | Admit: 2023-09-17 | Discharge: 2023-09-17 | Disposition: A | Discharge status: Home / Self Care | Payer: Federal, State, Local not specified - PPO | Source: Ambulatory Visit | Attending: Medical | Admitting: Medical

## 2023-09-17 ENCOUNTER — Ambulatory Visit: Payer: Federal, State, Local not specified - PPO | Admitting: Medical

## 2023-09-17 VITALS — BP 124/64 | HR 60 | Ht 63.0 in | Wt 156.8 lb

## 2023-09-17 DIAGNOSIS — R062 Wheezing: Secondary | ICD-10-CM | POA: Insufficient documentation

## 2023-09-17 DIAGNOSIS — R5383 Other fatigue: Secondary | ICD-10-CM

## 2023-09-17 DIAGNOSIS — R0609 Other forms of dyspnea: Secondary | ICD-10-CM

## 2023-09-17 DIAGNOSIS — R350 Frequency of micturition: Secondary | ICD-10-CM

## 2023-09-17 DIAGNOSIS — R8290 Unspecified abnormal findings in urine: Secondary | ICD-10-CM

## 2023-09-17 DIAGNOSIS — G35 Multiple sclerosis: Secondary | ICD-10-CM

## 2023-09-17 DIAGNOSIS — R7989 Other specified abnormal findings of blood chemistry: Secondary | ICD-10-CM

## 2023-09-17 DIAGNOSIS — R300 Dysuria: Secondary | ICD-10-CM | POA: Diagnosis not present

## 2023-09-17 DIAGNOSIS — N3000 Acute cystitis without hematuria: Secondary | ICD-10-CM

## 2023-09-17 LAB — CBC WITH DIFFERENTIAL/PLATELET
Basophils Absolute: 0.1 10*3/uL (ref 0.0–0.1)
Basophils Relative: 1 % (ref 0.0–3.0)
Eosinophils Absolute: 0.2 10*3/uL (ref 0.0–0.7)
Eosinophils Relative: 2.9 % (ref 0.0–5.0)
HCT: 36.8 % (ref 36.0–46.0)
Hemoglobin: 12.2 g/dL (ref 12.0–15.0)
Lymphocytes Relative: 20.4 % (ref 12.0–46.0)
Lymphs Abs: 1.6 10*3/uL (ref 0.7–4.0)
MCHC: 33.2 g/dL (ref 30.0–36.0)
MCV: 97.1 fL (ref 78.0–100.0)
Monocytes Absolute: 0.6 10*3/uL (ref 0.1–1.0)
Monocytes Relative: 7.8 % (ref 3.0–12.0)
Neutro Abs: 5.2 10*3/uL (ref 1.4–7.7)
Neutrophils Relative %: 67.9 % (ref 43.0–77.0)
Platelets: 356 10*3/uL (ref 150.0–400.0)
RBC: 3.79 Mil/uL — ABNORMAL LOW (ref 3.87–5.11)
RDW: 12.5 % (ref 11.5–15.5)
WBC: 7.7 10*3/uL (ref 4.0–10.5)

## 2023-09-17 LAB — POCT URINALYSIS DIPSTICK
Bilirubin, UA: NEGATIVE
Blood, UA: NEGATIVE
Glucose, UA: NEGATIVE
Ketones, UA: NEGATIVE
Nitrite, UA: NEGATIVE
Protein, UA: NEGATIVE
Spec Grav, UA: 1.01 (ref 1.010–1.025)
Urobilinogen, UA: 0.2 U/dL
pH, UA: 5 (ref 5.0–8.0)

## 2023-09-17 LAB — COMPREHENSIVE METABOLIC PANEL
ALT: 18 U/L (ref 0–35)
AST: 32 U/L (ref 0–37)
Albumin: 4.6 g/dL (ref 3.5–5.2)
Alkaline Phosphatase: 73 U/L (ref 39–117)
BUN: 20 mg/dL (ref 6–23)
CO2: 32 meq/L (ref 19–32)
Calcium: 9.8 mg/dL (ref 8.4–10.5)
Chloride: 96 meq/L (ref 96–112)
Creatinine, Ser: 0.82 mg/dL (ref 0.40–1.20)
GFR: 76.23 mL/min (ref >60.00)
Glucose, Bld: 87 mg/dL (ref 70–99)
Potassium: 4.6 meq/L (ref 3.5–5.1)
Sodium: 137 meq/L (ref 135–145)
Total Bilirubin: 0.8 mg/dL (ref 0.2–1.2)
Total Protein: 7 g/dL (ref 6.0–8.3)

## 2023-09-17 LAB — BRAIN NATRIURETIC PEPTIDE: Pro B Natriuretic peptide (BNP): 120 pg/mL — ABNORMAL HIGH (ref 0.0–100.0)

## 2023-09-17 MED ORDER — METHYLPREDNISOLONE 4 MG PO TABS: ORAL_TABLET | ORAL | 0 refills | Status: DC

## 2023-09-17 MED ORDER — NITROFURANTOIN MONOHYD MACRO 100 MG PO CAPS: 100.0000 mg | ORAL_CAPSULE | Freq: Two times a day (BID) | ORAL | 0 refills | Status: DC

## 2023-09-17 MED ORDER — BUSPIRONE HCL 7.5 MG PO TABS: 15.0000 mg | ORAL_TABLET | Freq: Two times a day (BID) | ORAL | 0 refills | Status: DC

## 2023-09-17 NOTE — Patient Instructions (Addendum)
Urinary Tract Infection (UTI) Frequent urination, urgency, and malodorous urine suggestive of UTI. Patient has a history of UTI three weeks ago with Citrobacter freundii, treated with Cephalexin. - urine culture pending. Start macrobid. Hydrate well.  Shortness of Breath(probable reactive airways) mild bnp elevation in past. some lvh and diastolic dysfunction. Patient reports feeling like she can't take a full breath, with some wheezing. She has been using Symbicort and Albuterol without significant relief. No history of smoking. - Order BNP, CBC, and metabolic panel. - Order chest x-ray. - If labs and chest x-ray are normal, consider a 6-day Medrol taper. - If no improvement by Friday morning, consider further studies including a chest CT. -please update me  friday morning early as to how you are doing. -if any severe worse sign or symptoms be seen in ED.  Dizziness Mild dizziness reported yesterday. Neurological exam was normal today. - Advise patient to report any severe dizziness or associated symptoms, and to seek emergency care if these occur.  Anxiety -recently worse and on discussion will try buspar 15 mg twice daily.  Follow up date to be determined after lab review.

## 2023-09-17 NOTE — Progress Notes (Signed)
   Subjective:    Patient ID: Catherine Hubbard, female    DOB: 12/29/1960, 63 y.o.   MRN: [id number]  HPI    Advised pcv 20 vaccine, rsv and get shingrix vaccine  Review of Systems  Constitutional:  Negative for chills, fatigue and fever.  Respiratory:  Positive for shortness of breath and wheezing. Negative for cough and chest tightness.   Cardiovascular:  Negative for chest pain and palpitations.  Gastrointestinal:  Negative for abdominal pain, blood in stool and constipation.  Genitourinary:  Positive for dysuria, frequency and urgency.  Musculoskeletal:  Negative for back pain.  Neurological:  Negative for dizziness, seizures, weakness and light-headedness.  Hematological:  Negative for adenopathy.  Psychiatric/Behavioral:  Negative for behavioral problems and confusion. The patient is nervous/anxious.        Recent mild increase anxiety. Discussed with pt will increae buspar to 15 mg bid.       Objective:   Physical Exam        Assessment & Plan:   Patient Instructions  Urinary Tract Infection (UTI) Frequent urination, urgency, and malodorous urine suggestive of UTI. Patient has a history of UTI three weeks ago with Citrobacter freundii, treated with Cephalexin. - urine culture pending. Start macrobid.  Shortness of Breath(probable reactive airways) mild bnp elevation in past. some lvh and diastolic dysfunction. Patient reports feeling like she can't take a full breath, with some wheezing. She has been using Symbicort and Albuterol without significant relief. No history of smoking. - Order BNP, CBC, and metabolic panel. - Order chest x-ray. - If labs and chest x-ray are normal, consider a 6-day Medrol taper. - If no improvement by Friday morning, consider further studies including a chest CT. -please update me  friday morning early as to how you are doing. -if any severe worse sign or symptoms be seen in ED.  Dizziness Mild dizziness reported yesterday.  Neurological exam was normal today. - Advise patient to report any severe dizziness or associated symptoms, and to seek emergency care if these occur.  Follow up date to be determined after lab review.   [name], PA-C

## 2023-09-19 ENCOUNTER — Telehealth: Payer: Self-pay | Admitting: Neurology

## 2023-09-19 LAB — URINE CULTURE
MICRO NUMBER:: 16008057
SPECIMEN QUALITY:: ADEQUATE

## 2023-09-19 NOTE — Telephone Encounter (Signed)
Pt is asking for a call with MRI results when available

## 2023-09-19 NOTE — Addendum Note (Signed)
Addended by: Gwenevere Abbot on: 09/19/2023 02:21 PM   Modules accepted: Orders

## 2023-09-19 NOTE — Telephone Encounter (Signed)
Dr. Epimenio Foot- do you have these results? Looks like she completed MR Angio head/neck 09/11/23

## 2023-09-19 NOTE — Telephone Encounter (Signed)
Called and spoke with pt. Relayed Dr. Bonnita Hollow message. She verbalized understanding and appreciation.

## 2023-09-22 ENCOUNTER — Encounter: Payer: Self-pay | Admitting: Neurology

## 2023-09-24 ENCOUNTER — Other Ambulatory Visit: Payer: Self-pay | Admitting: Medical

## 2023-09-26 ENCOUNTER — Ambulatory Visit: Payer: Federal, State, Local not specified - PPO | Admitting: Hematology & Oncology

## 2023-09-26 ENCOUNTER — Other Ambulatory Visit: Payer: Federal, State, Local not specified - PPO

## 2023-09-26 ENCOUNTER — Telehealth: Payer: Self-pay | Admitting: Medical

## 2023-09-26 NOTE — Telephone Encounter (Signed)
Pt called and lvm to return call to schedule . °

## 2023-09-26 NOTE — Telephone Encounter (Signed)
 LVM to schedule a FU appt with her provider. (Pt had med reaction).

## 2023-09-27 ENCOUNTER — Other Ambulatory Visit: Payer: Self-pay | Admitting: Medical

## 2023-10-01 ENCOUNTER — Other Ambulatory Visit: Payer: Federal, State, Local not specified - PPO

## 2023-10-01 ENCOUNTER — Ambulatory Visit: Payer: Federal, State, Local not specified - PPO | Admitting: Medical

## 2023-10-01 ENCOUNTER — Other Ambulatory Visit (HOSPITAL_BASED_OUTPATIENT_CLINIC_OR_DEPARTMENT_OTHER): Payer: Self-pay

## 2023-10-01 DIAGNOSIS — N3000 Acute cystitis without hematuria: Secondary | ICD-10-CM | POA: Diagnosis not present

## 2023-10-01 MED ORDER — PREVNAR 20 0.5 ML IM SUSY
0.5000 mL | PREFILLED_SYRINGE | Freq: Once | INTRAMUSCULAR | 0 refills | Status: AC
  Filled 2023-10-01: qty 0.5, 1d supply, fill #0

## 2023-10-03 ENCOUNTER — Encounter: Payer: Self-pay | Admitting: Medical

## 2023-10-03 LAB — URINE CULTURE
MICRO NUMBER:: 16069338
SPECIMEN QUALITY:: ADEQUATE

## 2023-10-04 MED ORDER — AMPICILLIN 500 MG PO CAPS
500.0000 mg | ORAL_CAPSULE | Freq: Three times a day (TID) | ORAL | 0 refills | Status: DC
Start: 2023-10-04 — End: 2024-01-22

## 2023-10-04 NOTE — Addendum Note (Signed)
Addended by: Gwenevere Abbot on: 10/04/2023 12:56 PM   Modules accepted: Orders

## 2023-10-14 DIAGNOSIS — M1712 Unilateral primary osteoarthritis, left knee: Secondary | ICD-10-CM | POA: Diagnosis not present

## 2023-10-15 ENCOUNTER — Encounter: Payer: Self-pay | Admitting: *Deleted

## 2023-10-21 DIAGNOSIS — M1712 Unilateral primary osteoarthritis, left knee: Secondary | ICD-10-CM | POA: Diagnosis not present

## 2023-10-21 DIAGNOSIS — R1314 Dysphagia, pharyngoesophageal phase: Secondary | ICD-10-CM | POA: Diagnosis not present

## 2023-10-21 DIAGNOSIS — K219 Gastro-esophageal reflux disease without esophagitis: Secondary | ICD-10-CM | POA: Diagnosis not present

## 2023-10-21 DIAGNOSIS — K222 Esophageal obstruction: Secondary | ICD-10-CM | POA: Diagnosis not present

## 2023-10-23 ENCOUNTER — Other Ambulatory Visit: Payer: Self-pay | Admitting: Neurology

## 2023-10-25 DIAGNOSIS — K449 Diaphragmatic hernia without obstruction or gangrene: Secondary | ICD-10-CM | POA: Diagnosis not present

## 2023-10-25 DIAGNOSIS — Z9884 Bariatric surgery status: Secondary | ICD-10-CM | POA: Diagnosis not present

## 2023-10-25 DIAGNOSIS — K2289 Other specified disease of esophagus: Secondary | ICD-10-CM | POA: Diagnosis not present

## 2023-10-25 DIAGNOSIS — Z9889 Other specified postprocedural states: Secondary | ICD-10-CM | POA: Diagnosis not present

## 2023-10-25 DIAGNOSIS — K222 Esophageal obstruction: Secondary | ICD-10-CM | POA: Diagnosis not present

## 2023-10-25 DIAGNOSIS — K219 Gastro-esophageal reflux disease without esophagitis: Secondary | ICD-10-CM | POA: Diagnosis not present

## 2023-10-25 DIAGNOSIS — R1314 Dysphagia, pharyngoesophageal phase: Secondary | ICD-10-CM | POA: Diagnosis not present

## 2023-10-28 DIAGNOSIS — M1712 Unilateral primary osteoarthritis, left knee: Secondary | ICD-10-CM | POA: Diagnosis not present

## 2023-10-29 NOTE — Telephone Encounter (Signed)
 Received message in epic that pt has not read message. I spoke w/ Holly/[facility] today. She received updated insurance info from husband.

## 2023-11-11 ENCOUNTER — Encounter: Payer: Self-pay | Admitting: *Deleted

## 2023-11-12 ENCOUNTER — Encounter: Payer: Self-pay | Admitting: Neurology

## 2023-11-13 NOTE — Telephone Encounter (Signed)
 [facility]. [facility] will not accept pt med from pt/office either. Has to be delivered to them from specialty pharmacy.

## 2023-11-13 NOTE — Telephone Encounter (Signed)
 Per Jeanice Lim, Estate manager/land agent spoke w/ pt who requested to be referred out. Waiting on speak with Tylene Fantasia about this and how to best proceed

## 2023-11-13 NOTE — Telephone Encounter (Addendum)
 Spoke w/ Butch Penny. She spoke with [facility] and pt will infuse with them tomorrow. Pt will then be referred out to another site for future infusions at that point. [facility] manager spoke w/ pt and this was the agreement per Tylene Fantasia.

## 2023-11-13 NOTE — Telephone Encounter (Signed)
 Spoke w/ Holly/Kim with [facility] who state pt is the one who has to speak with pharmacy to give permission to ship the medication.   Jeanice Lim is also following up with their manager, Aundra Millet, to see if she has spoken with the pt. I am waiting on response from [facility] on this.  Update: Per Brooke Pace states she tried calling pt twice and cannot get a hold of her, unable to LVM. She is traveling but will call pt again once off plane. She is also reaching out to their legal team to clarify HIPAA regulations for their company.   [phone]. Spoke w/ Gwenlyn Found. Transferred me to dedicated team that handles pt prescriptions. Spoke w/ [place].  Showing first order of Ocrevus placed for pt was 09/2020.  During their new pt enrollment process, they ask pt if she consents for order to be placed by doctor on behalf of her and she gave consent for this. Order placed 11/05/23 and spoke with [facility].  Once med shipped, cannot return or exchange med.  I called [facility] at [phone]. Spoke w/ Production designer, theatre/television/film. Cannot accept outside med, has to delivered to them by specialty pharmacy.

## 2023-11-14 DIAGNOSIS — G35 Multiple sclerosis: Secondary | ICD-10-CM | POA: Diagnosis not present

## 2023-11-14 NOTE — Telephone Encounter (Signed)
 Per Bre, pt successfully infused today with Ocrevus and agreeable to be referred to [facility].  Faxed orders to them at [phone], received fax confirmation.

## 2023-11-14 NOTE — Telephone Encounter (Signed)
 Pt coming at 11am today for infusion. Will be infused by Bre. I provided Bre info for [facility]/directions from pt home ot [facility] to provide pt when she comes. Asked to let her know we will try and transition to this site for future infusions and will call if they cannot take her for any reason to discuss alternate plan.

## 2023-11-17 ENCOUNTER — Other Ambulatory Visit: Payer: Self-pay | Admitting: Medical

## 2023-11-20 ENCOUNTER — Ambulatory Visit: Payer: Federal, State, Local not specified - PPO | Admitting: Cardiology

## 2023-11-25 ENCOUNTER — Ambulatory Visit: Payer: Federal, State, Local not specified - PPO | Admitting: Neurology

## 2023-11-28 ENCOUNTER — Other Ambulatory Visit: Payer: Self-pay | Admitting: Medical

## 2023-11-28 ENCOUNTER — Other Ambulatory Visit: Payer: Self-pay | Admitting: Neurology

## 2023-12-02 NOTE — Telephone Encounter (Signed)
 Last seen on 08/12/23 No 6 month follow up scheduled    Dispensed Days Supply Quantity Provider Pharmacy  BACLOFEN 10 MG TABLET 10/23/2023 90 270 each Sater, Sherida Dimmer, MD [facility] [phone] - J...      Too soon to refill

## 2023-12-03 ENCOUNTER — Other Ambulatory Visit: Payer: Self-pay | Admitting: Neurology

## 2023-12-03 ENCOUNTER — Other Ambulatory Visit: Payer: Self-pay | Admitting: *Deleted

## 2023-12-03 MED ORDER — MODAFINIL 200 MG PO TABS: ORAL_TABLET | ORAL | 2 refills | Status: DC

## 2023-12-03 NOTE — Telephone Encounter (Signed)
 Last seen 08/12/23 and no f/u scheduled currently. Per notes, Dr. Godwin Lat wanted to see her back in 6 months (around 02/10/24).   I called pt at [phone] and LVM to schedule follow up.   Last refilled modafinil 10/23/23 #45.

## 2023-12-04 NOTE — Telephone Encounter (Signed)
 Took call from phone room and spoke w/ pt. Scheduled 6 month f/u 02/10/24 at 1pm with Dr. Godwin Lat.

## 2023-12-23 ENCOUNTER — Telehealth: Payer: Self-pay | Admitting: Pharmacist

## 2023-12-23 NOTE — Telephone Encounter (Signed)
 Pharmacy Patient Advocate Encounter  Received notification from [facility] that Prior Authorization for Modafinil  200MG  tablets has been APPROVED from 11/23/2023 to 12/22/2024   PA #/Case ID/Reference #: [id number]

## 2023-12-24 NOTE — Telephone Encounter (Addendum)
 Called [facility] at [phone] to get update on referral. Spoke w/ [place]. They have been trying to reach pt. Reached out last yesterday. Mailed letter/sent text/LVM. Triedx4.  She is at risk for them to close case out d/t being unreachable.  After six attempts, they close case out. I placed on hold and tried to reach pt at [phone]. LVM for pt to call office today to discuss.

## 2023-12-25 ENCOUNTER — Other Ambulatory Visit: Payer: Self-pay | Admitting: Medical

## 2023-12-31 ENCOUNTER — Encounter: Payer: Self-pay | Admitting: Medical

## 2023-12-31 MED ORDER — NYSTATIN 100000 UNIT/GM EX CREA: 1.0000 | TOPICAL_CREAM | Freq: Two times a day (BID) | CUTANEOUS | 1 refills | Status: AC

## 2024-01-07 ENCOUNTER — Encounter: Attending: Physical Medicine and Rehabilitation | Admitting: Physical Medicine and Rehabilitation

## 2024-01-07 NOTE — Telephone Encounter (Signed)
 Called [facility] at [phone] to check on status of referral. Spoke w/ Felicity. They are still unable to reach pt currently.

## 2024-01-09 NOTE — Telephone Encounter (Signed)
 Called and spoke w/ pt. She will call [facility] back today at [phone]. She had tried calling a couple times but had wrong number written down it sounds like. She will call back if she has any further trouble reaching them.

## 2024-01-10 ENCOUNTER — Telehealth: Payer: Self-pay | Admitting: Neurology

## 2024-01-10 NOTE — Telephone Encounter (Signed)
 [facility] has tried reaching out to pt 5 times with no luck, they are asking if RN can contact pt to have her call them

## 2024-01-14 NOTE — Telephone Encounter (Signed)
I called and LVM for pt to call office

## 2024-01-15 ENCOUNTER — Encounter: Payer: Self-pay | Admitting: Medical

## 2024-01-15 ENCOUNTER — Other Ambulatory Visit: Payer: Self-pay | Admitting: Neurology

## 2024-01-15 ENCOUNTER — Other Ambulatory Visit: Payer: Self-pay | Admitting: Medical

## 2024-01-15 NOTE — Telephone Encounter (Signed)
 LVM for pt to call office

## 2024-01-15 NOTE — Telephone Encounter (Signed)
 Follow up scheduled on 02/10/24

## 2024-01-17 ENCOUNTER — Other Ambulatory Visit: Payer: Self-pay | Admitting: Medical

## 2024-01-20 ENCOUNTER — Encounter: Attending: Physical Medicine and Rehabilitation | Admitting: Physical Medicine and Rehabilitation

## 2024-01-20 ENCOUNTER — Encounter: Payer: Self-pay | Admitting: Physical Medicine and Rehabilitation

## 2024-01-20 VITALS — BP 127/85 | HR 80 | Ht 63.0 in | Wt 127.2 lb

## 2024-01-20 DIAGNOSIS — G629 Polyneuropathy, unspecified: Secondary | ICD-10-CM | POA: Insufficient documentation

## 2024-01-20 DIAGNOSIS — M7918 Myalgia, other site: Secondary | ICD-10-CM | POA: Insufficient documentation

## 2024-01-20 DIAGNOSIS — M791 Myalgia, unspecified site: Secondary | ICD-10-CM | POA: Insufficient documentation

## 2024-01-20 DIAGNOSIS — F411 Generalized anxiety disorder: Secondary | ICD-10-CM | POA: Insufficient documentation

## 2024-01-20 DIAGNOSIS — M48062 Spinal stenosis, lumbar region with neurogenic claudication: Secondary | ICD-10-CM | POA: Insufficient documentation

## 2024-01-20 MED ORDER — MELOXICAM 15 MG PO TABS: 15.0000 mg | ORAL_TABLET | Freq: Every day | ORAL | 2 refills | Status: DC

## 2024-01-20 NOTE — Progress Notes (Addendum)
 Subjective:    Patient ID: Catherine Hubbard, female    DOB: Apr 14, 1961, 63 y.o.   MRN: [id number]  HPI  Catherine Hubbard presents for follow-up of lumbar radicular pain and cervical spine pain, and occipital neuralgia  1) Cervical myofascial pain syndrome: -the trigger point injections helped previously -She continues to have tension and would like to repeat next visit  2) Impaired mobility and ADLs -She has difficulty lifting weights and this makes it hard for her to find a job.   3) Cervical spinal stenosis: She currently has pain radiating from her neck down to her ankles.  -The pain is constant, irritating, burning stabbing. -The Meloxicam  is helping- she takes when pain is getting worse.  -having pain radiating down her left arm -very bothersome to her -worse when she leans forward to watch TV and when she leans neck backward Radiates down to the lateral aspect of her arm.  -somewhat interested in cervical traction device -has seen PT before  4) Alcohol abuse: -Has led to falls in the past.  -she is doing better.   5) spinal compression fracture: -fell off her bed and suffered a spinal compression fracture -she is wearing a spinal orthosis -she was prescribed oxycodone  and used this once yesterday -she takes vitamin D  every day.  -interested in trying zynex  6) Depression -fluctuates in severity -still taking effexor  and Prozac .  -has had recent deaths in her family.   7) Right knee hematoma -has required bilateral steroid injections  8) Anxiety: -she asks for alprazolam  as experienced great benefits with this  9) Bilateral lower extremity neuropathy: -has started recently and inhibits her ability to sleep well  10) Bilateral trigger finger: -would like trigger finger injections  Prior history: She threw things in her home last night due to her pain.   She has pain radiating into her legs, as well as swelling.   Her frustration level has been very  high recently.   She would be interested in a repeat ESI and her pain is now present in the left lower extremity as well.   Prior history:  Lower back pain:  08/25/19: Her lower back pain is worst with extension and is present in her bilateral lower back. Since last visit she has been using a lumbar support brace which does provide some relief. She has been doing her HEP every day. She has had steroid injections in the past that did provide her with relief. She has pain in bilateral lower extremities as well in the anterior and lateral distributions. This pain is worse with movement-walking, standing, and when she lays flat.   10/06/19: On 1/21 she had a right sided L4-L5 epidural steroid injection with Dr. Sharl Davies. Her back pain is now much improved.    Bilateral hip pain: She has had some bilateral hip pain. She has been doing exercises at home for this. But she is now able to walk a lot more. She sometimes goes to the grocery store for 2 hours at a time to do her shopping as well as get exercise in walking. She still has difficulty with stairs.    Poor energy:  2/16: Her energy is now much improved and she has stopped taking the Ritalin .    Depression:  1/05: She say a psychiatrist in the past and was prescribed Prozac , Effexor , and Cymbalta  which she found to be helpful for her depression. Last visit we restarted her on Prozac  and this did provide some relief. She has  not been following with the psychiatrist any more and does not want to at this time.    2/16: Her mood is much improved with these medications and she is not experiencing any adverse side effects. Her friends tell her she is like a new person and she has excellent energy. 1 month ago she says she felt she could not return to work but now she feels that she may be able to.    Left CMC joint pain: She notes increased swelling in this joint compared to the opposite side and it can sometimes pain her a lot. She has a listed allergy  to NSAIDs but said this was gastritis from ibuprofen  more than 10 years ago. She has since taken ibuprofen  without issues. Has never had any imaging of her hand or splint or steroid injection.   6/9: She was dong really well except for a month ago, when her back pain and lowe leg cramping returned. She feels soreness in both legs as well as in her back. She would be interested in repeat steroid injection. She feels like her fatigue has worsened and she is sleeping 16 hours per day. She does not have interest in anything. She is most bored in the afternoons. Her husband tries to watch TV with her in the afternoons to stimulate her. She used to like to talk to her neighbors but she has had some issues of her own. Her roommate is a godfearing woman and she does not want to be preached to from her.    Pain Inventory Average Pain 3  Pain Right Now 8 My pain is intermittent, sharp, burning, stabbing, tingling, and aching  In the last 24 hours, has pain interfered with the following? General activity 8 Relation with others 8 Enjoyment of life 8 What TIME of day is your pain at its worst? morning,daytime, evening, night, anytime Sleep (in general) Fair  Pain is worse with: inactivity and unsure Pain improves with: rest, heat/ice, and medication Relief from Meds: 4         Family History  Problem Relation Age of Onset   Cancer Mother    Stroke Mother    Cancer Father    Social History   Socioeconomic History   Marital status: Married    Spouse name: Not on file   Number of children: Not on file   Years of education: Not on file   Highest education level: Not on file  Occupational History   Not on file  Tobacco Use   Smoking status: Never   Smokeless tobacco: Never   Tobacco comments:    second hand smoker when young. Heavy exposure.  Vaping Use   Vaping status: Never Used  Substance and Sexual Activity   Alcohol use: Yes    Alcohol/week: 6.0 standard drinks of alcohol     Types: 6 Cans of beer per week    Comment: daily, couple of shots ( weekly depending on day)   Drug use: No   Sexual activity: Not Currently  Other Topics Concern   Not on file  Social History Narrative   Not on file   Social Drivers of Health   Financial Resource Strain: Not on file  Food Insecurity: Low Risk  (10/21/2023)   Received from [facility]   Hunger Vital Sign    Worried About Running Out of Food in the Last Year: Never true    Ran Out of Food in the Last Year: Never true  Transportation Needs:  No Transportation Needs (10/21/2023)   Received from [facility]   Transportation    In the past 12 months, has lack of reliable transportation kept you from medical appointments, meetings, work or from getting things needed for daily living? : No  Physical Activity: Not on file  Stress: Not on file  Social Connections: Not on file   Past Surgical History:  Procedure Laterality Date   ANKLE FRACTURE SURGERY Bilateral    CARPAL TUNNEL RELEASE Bilateral    CHOLECYSTECTOMY     COLONOSCOPY  11/2016   multiple   ESOPHAGOGASTRODUODENOSCOPY  11/2016   ESOPHAGOGASTRODUODENOSCOPY N/A 02/01/2017   Procedure: ESOPHAGOGASTRODUODENOSCOPY (EGD);  Surgeon: Kenney Peacemaker, MD;  Location: [facility] ENDOSCOPY;  Service: Endoscopy;  Laterality: N/A;   ESOPHAGOGASTRODUODENOSCOPY (EGD) WITH PROPOFOL  N/A 02/04/2017   Procedure: ESOPHAGOGASTRODUODENOSCOPY (EGD) WITH PROPOFOL ;  Surgeon: Asencion Blacksmith, MD;  Location: [facility] ENDOSCOPY;  Service: Endoscopy;  Laterality: N/A;   LAPAROSCOPIC GASTRIC SLEEVE RESECTION  2011   ULNAR NERVE REPAIR Bilateral    Past Medical History:  Diagnosis Date   Alcohol abuse    Asthma    Esophagitis    GERD (gastroesophageal reflux disease)    Headache    Hearing loss    Hypertension    Multiple sclerosis (HCC)    Pancreatitis    Vision abnormalities    BP 127/85 (BP Location: Left Arm, Patient Position: Sitting)   Pulse 80   Ht 5\' 3"  (1.6 m)   Wt 127 lb 3.2 oz  (57.7 kg)   LMP 08/13/2014 Comment: very few periods  SpO2 96%   BMI 22.53 kg/m   Opioid Risk Score:   Fall Risk Score:  `1  Depression screen [facility] 2/9     01/20/2024    1:15 PM 07/09/2023   11:28 AM 05/10/2023   10:57 AM 09/21/2022   10:53 AM 08/01/2022   10:38 AM 04/27/2022   11:21 AM 11/06/2021    9:07 AM  Depression screen PHQ 2/9  Decreased Interest 0 3 0 0 0 0 0  Down, Depressed, Hopeless 0 3 0 0 0 0 0  PHQ - 2 Score 0 6 0 0 0 0 0    Review of Systems  Constitutional: Negative.   Eyes: Negative.   Respiratory: Negative.    Endocrine: Negative.   Genitourinary: Negative.   Musculoskeletal:  Positive for neck pain.       Left arm  Allergic/Immunologic: Negative.        Objective:   Physical Exam  General: Alert and oriented x 3, No apparent distress HEENT: Head is normocephalic, atraumatic, PERRLA, EOMI, sclera anicteric, oral mucosa pink and moist, dentition intact, ext ear canals clear,  Neck: Supple without JVD or lymphadenopathy Heart: Reg rate and rhythm. No murmurs rubs or gallops Chest: CTA bilaterally without wheezes, rales, or rhonchi; no distress Abdomen: Soft, non-tender, non-distended, bowel sounds positive. Extremities: No clubbing, cyanosis, or edema. Pulses are 2+ Psych: Pt's affect is appropriate. Pt is cooperative Skin: Clean and intact without signs of breakdown Ext: bilateral lower extremity edema Skin: bruising left arm Neuro/Musculoskeletal: Able to walk in office without falls with RW.  Remains sitting most of this session in contrast to last session. Able to bend down and touch her toes with FROM. Diffuse tenderness along spine with gentle palpation. Extension and oblique extension reproduce pain in lumbar spine. 5/5 strength in bilateral arms. 4-/5 in bilateral legs due to diffuse weakness. No focal motor or sensory deficits. + Slump test in bilateral  lower extremities.   Has increased swelling at left [facility] joint with increased tenderness to  palpation compared to opposite side.  Range of motion of neck limited to 40 degrees bilateral, tingling in neck reproduced with neck flexion. B/l trigger fingers in her thumbs Tightness in upper back muscles Psych: pleasant, normal affect, attention is significantly improved from last visit.        Assessment & Plan:  Catherine Hubbard is a 63 y.o. female with history of alcohol abuse, multiple sclerosis and frequent falls, presenting for follow-up for her diffuse chronic pain and depression., cervical and lumbar spine pain, compression fracture.    1) Poor energy: The Ritalin  has really helped with her attention and energy and has helped her to be more active. Her energy is tremendously improved and she no longer requires the Ritalin  which she has stopped taking on her own.    2) Depression: Continue Prozac  and Effexor  for depression.She is experiencing worsening anhedonia and excessive sleep. Increase Prozac  to 40mg .   -Has become more severe. Add Amitriptyline  10mg  HS.    Keep a daily log of goals and check them off every day.   Maintain social connections in your life. Continue exercising with your neighbors.    Walk at least 15 minutes every day, preferably outside.    Look for local organizations and programs that can add meaning and friendship to your life.    Make an effort to do activities that you enjoy, such as going to the [employer], shopping, [employer] (cost is an issue here), consider return to work in a sedentary job.      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  -refilled meloxicam  -I have personally reviewed Lumbar MRI which shows mild-moderate stenosis. On 1/21 she had a right sided L4-L5 epidural steroid injection with Dr. Sharl Davies with great improvement in symptoms.  Will refer for repeat injection.  -Discussed Meloxicam  as an option to consider in the future.  -Discussed current symptoms of pain and history of pain.  -Discussed  benefits of exercise in reducing pain. -Discussed following foods that may reduce pain: 1) Ginger (especially studied for arthritis)- reduce leukotriene production to decrease inflammation 2) Blueberries- high in phytonutrients that decrease inflammation 3) Salmon- marine omega-3s reduce joint swelling and pain 4) Pumpkin seeds- reduce inflammation 5) dark chocolate- reduces inflammation 6) turmeric- reduces inflammation 7) tart cherries - reduce pain and stiffness 8) extra virgin olive oil - its compound olecanthal helps to block prostaglandins  9) chili peppers- can be eaten or applied topically via capsaicin  10) mint- helpful for headache, muscle aches, joint pain, and itching 11) garlic- reduces inflammation  Link to further information on diet for chronic pain: [email or web address]    4) Bilateral greater trochanteric pain syndrome: Continue HEP. Educated regarding etiology and treatment of condition.   5) Impaired mobility and ADLs secondary to Multiple Sclerosis, Major Depression Disorder, L4-L5 lumbar stenosis: Patient's overall mobility and mental health are have regressed a little since last visit. She has been trying to obtain disability for financial support and I provided her with the address and phone number of the social security office and advised her that I am happy to help fill out any paperwork she needs. That being said, I advised her that returning to work on a part-time basis may be very good for her, improving her quality of life, physical function, giving her purpose, and improving her financial situation. She is agreeable to this and feels that she may be  able to return to work and will look for jobs that interest her and that will not be too strenuous for her (avoiding lifting heavy weights or climbing stairs). She is still nervous about falling at work. -Encouraged to continue looking for work.     6) Left CMC arthritis: Prescribed diclofenac  gel and advised that patient may apply up to 4 times per day. If this does not provide relief asked that patient call our office and I can order an XR and provide a referral for fitting for a splint. If this does not provide relief, we can consider corticosteroid injection.   7) Alcohol abuse: She drinks at least 3 alcoholic beverages per day. She would like to quit. Discussed some of the risks of alcohol use. Made goal to stop alcohol use given compression fracture. Recommended replacing third drink with Roobois tea.  -Discussed option of support groups or disulfiram .  -prescribed disulfiram   8) Falls: related to alcohol use. If we can get this under control, the falls will decrease.   9) Disrupted circadian rhythm: Sometimes sleeps at 5am and sleeps during the day. Discussed sleep aides.   10) Cervical myofascial pain syndrome: -Repeat trigger point injections next visit.  11) Cervical spinal stenosis: -discussed physical therapy -continue sit and be fit -continue Meloxicam , taking as needed.  -Keep up the turmeric.  -cervical MRI ordered.  -Discussed current symptoms of pain and history of pain.  -Discussed following foods that may reduce pain: 1) Ginger (especially studied for arthritis)- reduce leukotriene production to decrease inflammation 2) Blueberries- high in phytonutrients that decrease inflammation 3) Salmon- marine omega-3s reduce joint swelling and pain 4) Pumpkin seeds- reduce inflammation 5) dark chocolate- reduces inflammation 6) turmeric- reduces inflammation 7) tart cherries - reduce pain and stiffness 8) extra virgin olive oil - its compound olecanthal helps to block prostaglandins  9) chili peppers- can be eaten or applied topically via capsaicin  10) mint- helpful for headache, muscle aches, joint pain, and itching 11) garlic- reduces inflammation  Link to further information on diet for chronic pain:  [email or web address].[email or web address]   Link to further information on diet for chronic pain: [email or web address]     All questions were encouraged and answered. Follow up with me in mid-April.   12) Compression fracture: -a little but of goal cheese and green leafy vegetables every day.  -daily 10 minute walk outside.  -can continue oxycodone  for very severe pain as prescribed by NSGY. If pain is moderate can consider restarting meloxicam  -discussed risks of femur fracture -discussed natural healing process Prescribed Zynex Nexwave -continue amitriptyline   13) Headaches: -waking up every day with headaches .Foods to alleviate migraine:  1) dark leafy greens 2) avocado 3) tuna 4) samon and mackerel 5) beans and legumes Supplements that can be helpful: feverfew, B12, and magnesium   Foods to avoid in migraine: 1) Excessive (or irregular timing) coffee 2) red wine 3) aged cheeses 4) chocolate 5) citrus fruits 6) aspartame and other artifical sweeteners 7) yeast 8) MSG (in processed foods) 9) processed and cured meats 10) nuts and certain seeds 11) chicken livers and other organ meats 12) dairy products like buttermilk, sour cream, and yogurt 13) dried fruits like dates, figs, and raisins 14) garlic 15) onions 16) potato chips 17) pickled foods like olives and sauerkraut 18) some fresh fruits like ripe banana, papaya, red plums, raspberries, kiwi, pineapple 19) tomato-based products  Recommend to keep a migraine diary: rate daily the severity of your headache (1-10) and what foods you eat that  day to help determine patterns.    -continue amitripytline  20) Hematoma of right knee: -discussed recent bilateral steroid injections  21. HTN: -Advised checking BP daily at home and logging results to bring into follow-up appointment with PCP and  myself. -Reviewed BP meds today.  -Advised regarding healthy foods that can help lower blood pressure and provided with a list: 1) citrus foods- high in vitamins and minerals 2) salmon and other fatty fish - reduces inflammation and oxylipins 3) swiss chard (leafy green)- high level of nitrates 4) pumpkin seeds- one of the best natural sources of magnesium  5) Beans and lentils- high in fiber, magnesium , and potassium 6) Berries- high in flavonoids 7) Amaranth (whole grain, can be cooked similarly to rice and oats)- high in magnesium  and fiber 8) Pistachios- even more effective at reducing BP than other nuts 9) Carrots- high in phenolic compounds that relax blood vessels and reduce inflammation 10) Celery- contain phthalides that relax tissues of arterial walls 11) Tomatoes- can also improve cholesterol and reduce risk of heart disease 12) Broccoli- good source of magnesium , calcium , and potassium 13) Greek yogurt: high in potassium and calcium  14) Herbs and spices: Celery seed, cilantro, saffron, lemongrass, black cumin, ginseng, cinnamon, cardamom, sweet basil, and ginger 15) Chia and flax seeds- also help to lower cholesterol and blood sugar 16) Beets- high levels of nitrates that relax blood vessels  17) spinach and bananas- high in potassium  -Provided lise of supplements that can help with hypertension:  1) magnesium : one high quality brand is Bioptemizers since it contains all 7 types of magnesium , otherwise over the counter magnesium  gluconate 400mg  is a good option 2) B vitamins 3) vitamin D  4) potassium 5) CoQ10 6) L-arginine 7) Vitamin C 8) Beetroot -Educated that goal BP is 120/80. -Made goal to incorporate some of the above foods into diet.    22) Neuropathy, peripheral, in feet -discussed resolution with magnesium  spray  23) Anxiety: -referred to psych since would like to be started on alprazolam , discussed increased risk of dementia with this medication -discussed  that this has been stable -discussed that she was started on Buspar  and that she noticed a big difference when she first started this, discussed how se uses this prn  24) Bilateral trigger fingers in thumbs: -referred to ortho  25) Cervical myofascial pain syndrome: -trigger points scheduled for next visit

## 2024-01-20 NOTE — Telephone Encounter (Signed)
 Called pt at [phone]. LVM for pt to call.

## 2024-01-21 ENCOUNTER — Encounter: Payer: Self-pay | Admitting: *Deleted

## 2024-01-21 ENCOUNTER — Ambulatory Visit: Admitting: Medical

## 2024-01-21 NOTE — Telephone Encounter (Signed)
LVM for pt to call office again

## 2024-01-21 NOTE — Telephone Encounter (Signed)
Mailed unable to reach letter to pt

## 2024-01-22 ENCOUNTER — Ambulatory Visit: Payer: Self-pay | Admitting: Student

## 2024-01-22 ENCOUNTER — Ambulatory Visit: Admitting: Student

## 2024-01-22 ENCOUNTER — Encounter: Payer: Self-pay | Admitting: Student

## 2024-01-22 ENCOUNTER — Ambulatory Visit (HOSPITAL_BASED_OUTPATIENT_CLINIC_OR_DEPARTMENT_OTHER)
Admission: RE | Admit: 2024-01-22 | Discharge: 2024-01-22 | Disposition: A | Discharge status: Home / Self Care | Source: Ambulatory Visit | Attending: Student | Admitting: Student

## 2024-01-22 VITALS — BP 130/86 | HR 93 | Temp 98.1°F | Resp 12 | Ht 63.0 in | Wt 154.2 lb

## 2024-01-22 DIAGNOSIS — R062 Wheezing: Secondary | ICD-10-CM | POA: Insufficient documentation

## 2024-01-22 DIAGNOSIS — R300 Dysuria: Secondary | ICD-10-CM

## 2024-01-22 DIAGNOSIS — R0602 Shortness of breath: Secondary | ICD-10-CM | POA: Diagnosis not present

## 2024-01-22 DIAGNOSIS — J9811 Atelectasis: Secondary | ICD-10-CM | POA: Diagnosis not present

## 2024-01-22 LAB — POCT URINALYSIS DIP (MANUAL ENTRY)
Blood, UA: NEGATIVE
Glucose, UA: NEGATIVE mg/dL
Nitrite, UA: POSITIVE — AB
Protein Ur, POC: NEGATIVE mg/dL
Spec Grav, UA: <=1.005 — AB (ref 1.010–1.025)
Urobilinogen, UA: 0.2 U/dL
pH, UA: 6 (ref 5.0–8.0)

## 2024-01-22 MED ORDER — NITROFURANTOIN MONOHYD MACRO 100 MG PO CAPS: 100.0000 mg | ORAL_CAPSULE | Freq: Two times a day (BID) | ORAL | 0 refills | Status: AC

## 2024-01-22 MED ORDER — PREDNISONE 10 MG PO TABS: ORAL_TABLET | ORAL | 0 refills | Status: AC

## 2024-01-22 NOTE — Assessment & Plan Note (Addendum)
 Prednisone  taper. Pt using Symbicort  daily and albuterol  as needed. Advised daily allergy medication (Zyrtec or Claritin daily).Return to clinic if worsening  symptoms (dyspnea, increased sputum production, fever).

## 2024-01-22 NOTE — Addendum Note (Signed)
 Addended by: Marylou Sobers D on: 01/22/2024 04:54 PM   Modules accepted: Orders

## 2024-01-22 NOTE — Progress Notes (Addendum)
 Subjective:     Patient ID: Catherine Hubbard, female    DOB: Sep 16, 1960, 63 y.o.   MRN: [id number]  Chief Complaint  Patient presents with   urine smells   Urinary Frequency   breathing issues    Mucus    Cough    Coughing up bloody tinged phelgm    HPI Catherine Hubbard is a 63 yo female who presents for acute visit   Urinary Tract Infection: Patient complains of burning with urination, foul smelling urine, and frequency She has had symptoms for 6 days. Patient denies back pain, congestion, cough, fever, headache, rhinitis, sorethroat, and stomach ache. Patient do have a history of recurrent UTI.  Patient does not have a history of pyelonephritis.   Patient reports increased wheezing over the past few days, she believes may be related to allergies. She is also experiencing nasal congestion and frequent sneezing. +SOB, +wheezing  Patient denies fever, chills,  CP, palpitations, edema, HA, vision changes, N/V/D, abdominal pain,  rash, weight changes, and recent illness or hospitalizations.      History of Present Illness              Health Maintenance Due  Topic Date Due   Zoster Vaccines- Shingrix (1 of 2) Never done   Cervical Cancer Screening (HPV/Pap Cotest)  Never done    Past Medical History:  Diagnosis Date   Alcohol abuse    Asthma    Esophagitis    GERD (gastroesophageal reflux disease)    Headache    Hearing loss    Hypertension    Multiple sclerosis (HCC)    Pancreatitis    Vision abnormalities     Past Surgical History:  Procedure Laterality Date   ANKLE FRACTURE SURGERY Bilateral    CARPAL TUNNEL RELEASE Bilateral    CHOLECYSTECTOMY     COLONOSCOPY  11/2016   multiple   ESOPHAGOGASTRODUODENOSCOPY  11/2016   ESOPHAGOGASTRODUODENOSCOPY N/A 02/01/2017   Procedure: ESOPHAGOGASTRODUODENOSCOPY (EGD);  Surgeon: Kenney Peacemaker, MD;  Location: [facility] ENDOSCOPY;  Service: Endoscopy;  Laterality: N/A;   ESOPHAGOGASTRODUODENOSCOPY (EGD)  WITH PROPOFOL  N/A 02/04/2017   Procedure: ESOPHAGOGASTRODUODENOSCOPY (EGD) WITH PROPOFOL ;  Surgeon: Asencion Blacksmith, MD;  Location: [facility] ENDOSCOPY;  Service: Endoscopy;  Laterality: N/A;   LAPAROSCOPIC GASTRIC SLEEVE RESECTION  2011   ULNAR NERVE REPAIR Bilateral     Family History  Problem Relation Age of Onset   Cancer Mother    Stroke Mother    Cancer Father     Social History   Socioeconomic History   Marital status: Married    Spouse name: Not on file   Number of children: Not on file   Years of education: Not on file   Highest education level: Not on file  Occupational History   Not on file  Tobacco Use   Smoking status: Never   Smokeless tobacco: Never   Tobacco comments:    second hand smoker when young. Heavy exposure.  Vaping Use   Vaping status: Never Used  Substance and Sexual Activity   Alcohol use: Yes    Alcohol/week: 6.0 standard drinks of alcohol    Types: 6 Cans of beer per week    Comment: daily, couple of shots ( weekly depending on day)   Drug use: No   Sexual activity: Not Currently  Other Topics Concern   Not on file  Social History Narrative   Not on file   Social Drivers of Health   Financial Resource Strain:  Not on file  Food Insecurity: Low Risk  (10/21/2023)   Received from [facility]   Hunger Vital Sign    Worried About Running Out of Food in the Last Year: Never true    Ran Out of Food in the Last Year: Never true  Transportation Needs: No Transportation Needs (10/21/2023)   Received from [facility]    In the past 12 months, has lack of reliable transportation kept you from medical appointments, meetings, work or from getting things needed for daily living? : No  Physical Activity: Not on file  Stress: Not on file  Social Connections: Not on file  Intimate Partner Violence: Not At Risk (06/19/2023)   Humiliation, Afraid, Rape, and Kick questionnaire    Fear of Current or Ex-Partner: No    Emotionally Abused: No     Physically Abused: No    Sexually Abused: No    Outpatient Medications Prior to Visit  Medication Sig Dispense Refill   acetaminophen  (TYLENOL ) 500 MG tablet Take 2 tablets (1,000 mg total) by mouth every 6 (six) hours as needed for mild pain (pain score 1-3).     albuterol  (PROVENTIL ) (2.5 MG/3ML) 0.083% nebulizer solution Take 3 mLs (2.5 mg total) by nebulization every 6 (six) hours as needed for wheezing or shortness of breath. 150 mL 0   albuterol  (VENTOLIN  HFA) 108 (90 Base) MCG/ACT inhaler Inhale 2 puffs into the lungs every 6 (six) hours as needed for wheezing or shortness of breath. 18 g 1   Ascorbic Acid (VITAMIN C PO) Take 1 tablet by mouth daily. 1 tablet daily     B Complex Vitamins (VITAMIN B COMPLEX PO) Take 1 tablet by mouth daily. 1 daily     baclofen  (LIORESAL ) 10 MG tablet TAKE 1 TABLET BY MOUTH THREE TIMES A DAY AS NEEDED FOR MUSCLE SPASM 270 tablet 0   benzonatate  (TESSALON ) 100 MG capsule Take 1 capsule (100 mg total) by mouth 3 (three) times daily as needed for cough. 30 capsule 0   bismuth subsalicylate (PEPTO BISMOL) 262 MG/15ML suspension Take 30 mLs by mouth daily as needed for indigestion or diarrhea or loose stools (acid reflux).     budesonide -formoterol  (SYMBICORT ) 160-4.5 MCG/ACT inhaler Inhale 2 puffs into the lungs 2 (two) times daily. 1 each 3   busPIRone  (BUSPAR ) 7.5 MG tablet TAKE 2 TABLETS (15 MG TOTAL) BY MOUTH 2 (TWO) TIMES DAILY. 360 tablet 0   Calcium  Carbonate Antacid (TUMS PO) Take 2 tablets by mouth 2 (two) times daily as needed (acid reflux).     Cholecalciferol (D2000 ULTRA STRENGTH) 50 MCG (2000 UT) CAPS Take 2,000 Units by mouth daily.     diclofenac  Sodium (VOLTAREN ) 1 % GEL Apply 4 g topically 4 (four) times daily. (Patient taking differently: Apply 4 g topically 4 (four) times daily as needed (joint pain).) 400 g 1   fluconazole  (DIFLUCAN ) 150 MG tablet Take 1 tablet (150 mg total) by mouth daily. 1 tablet 0   fluticasone  (FLONASE ) 50 MCG/ACT  nasal spray SPRAY 2 SPRAYS INTO EACH NOSTRIL EVERY DAY 48 mL 3   gabapentin  (NEURONTIN ) 100 MG capsule TAKE 1 CAPSULE BY MOUTH THREE TIMES A DAY 90 capsule 1   HYDROcodone -acetaminophen  (NORCO) 5-325 MG tablet 1 tab po q 6 hours prn severe pain (Patient taking differently: Take 1 tablet by mouth every 6 (six) hours as needed for moderate pain (pain score 4-6) or severe pain (pain score 7-10). 1 tab po q 6 hours prn  severe pain) 30 tablet 0   Iron , Ferrous Sulfate , 325 (65 Fe) MG TABS 1 tab po bid (Patient taking differently: Take 1 tablet by mouth in the morning and at bedtime. 1 tab po bid) 60 tablet 3   modafinil  (PROVIGIL ) 200 MG tablet TAKE 1 TABLET BY MOUTH IN THE MORNING AND 1/2 TABLET IN THE EVENING. Call [phone] to schedule follow up around 02/10/24 for ongoing refills. 45 tablet 2   Multiple Vitamin (MULTIVITAMIN WITH MINERALS) TABS tablet Take 1 tablet by mouth daily.     nystatin  cream (MYCOSTATIN ) Apply 1 Application topically 2 (two) times daily. 30 g 1   ocrelizumab  (OCREVUS ) 300 MG/10ML injection INFUSE 600MG  INTRAVENOUSLY AS DIRECTED EVERY 6 MONTHS. (Patient taking differently: Inject 600 mg into the vein every 6 (six) months.) 20 mL 1   olmesartan  (BENICAR ) 5 MG tablet TAKE 2 TABLETS BY MOUTH EVERY DAY 180 tablet 3   ondansetron  (ZOFRAN ) 4 MG tablet Take 1 tablet (4 mg total) by mouth every 6 (six) hours. 12 tablet 0   oxybutynin  (DITROPAN ) 5 MG tablet TAKE 1 TABLET BY MOUTH TWICE A DAY 180 tablet 0   pantoprazole  (PROTONIX ) 40 MG tablet Take 1 tablet (40 mg total) by mouth daily. 30 tablet 0   pantoprazole  (PROTONIX ) 40 MG tablet Take 1 tablet (40 mg total) by mouth daily. 30 tablet 3   potassium chloride  SA (KLOR-CON  M) 20 MEQ tablet Take 1 tablet (20 mEq total) by mouth daily. 90 tablet 3   Probiotic Product (PROBIOTIC DAILY PO) Take 1 tablet by mouth 3 (three) times a week. 1 daily     traMADol  (ULTRAM ) 50 MG tablet 1 tab po bid prn severe pain 10 tablet 0   ALPRAZolam   (XANAX ) 0.5 MG tablet Take 2 or 3 pills before the MRI.  You must have a driver. 3 tablet 0   ampicillin  (PRINCIPEN) 500 MG capsule Take 1 capsule (500 mg total) by mouth 3 (three) times daily. 21 capsule 0   budesonide -formoterol  (SYMBICORT ) 80-4.5 MCG/ACT inhaler TAKE 2 PUFFS BY MOUTH TWICE A DAY 30.6 each 1   fluticasone  (FLONASE ) 50 MCG/ACT nasal spray Place 2 sprays into both nostrils daily. 16 g 1   furosemide  (LASIX ) 40 MG tablet Take 1 tablet (40 mg total) by mouth daily. 90 tablet 3   meloxicam  (MOBIC ) 15 MG tablet Take 1 tablet (15 mg total) by mouth daily. 30 tablet 2   methylPREDNISolone  (MEDROL ) 4 MG tablet Standard 6 day taper dose. 21 tablet 0   Facility-Administered Medications Prior to Visit  Medication Dose Route Frequency Provider Last Rate Last Admin   sodium chloride  (PF) 0.9 % injection 2 mL  2 mL Intravenous PRN         Allergies  Allergen Reactions   Ibuprofen  Other (See Comments)    Cannot take due to gastric bypass   Nsaids Other (See Comments)    Can not take due to Gastric bypass    ROS    See HPI Objective:     Physical Exam  General: No acute distress. Awake and conversant.  Eyes: Normal conjunctiva, anicteric. Round symmetric pupils.  ENT: No nasal discharge.  Neck: Neck is supple. No masses or thyromegaly.  Respiratory: Respirations are non-labored. + wheezing.  Skin: Warm. No rashes or ulcers.  Psych: Alert and oriented. Cooperative.  CV: RRR. No lower extremity edema.  GU: no CVA tenderness MSK:  No clubbing or cyanosis.    BP 130/86 (BP Location: Right Arm, Patient Position:  Sitting, Cuff Size: Normal)   Pulse 93   Temp 98.1 F (36.7 C) (Oral)   Resp 12   Ht 5\' 3"  (1.6 m)   Wt 154 lb 3.2 oz (69.9 kg)   LMP 08/13/2014 Comment: very few periods  SpO2 98%   BMI 27.32 kg/m  Wt Readings from Last 3 Encounters:  01/22/24 154 lb 3.2 oz (69.9 kg)  01/20/24 127 lb 3.2 oz (57.7 kg)  09/17/23 156 lb 12.8 oz (71.1 kg)       Assessment  & Plan:   Problem List Items Addressed This Visit     Dysuria - Primary   Reports Frequent urination, urgency, and malodorous urine suggestive of UTI.  - urine culture pending. Starting macrobid .  Urine culture.  Consider urogyn referral as this is 2nd UTI in 5 months       Relevant Medications   nitrofurantoin , macrocrystal-monohydrate, (MACROBID ) 100 MG capsule   SOB (shortness of breath)   Getting CBC, BNP, CMP  today. Chest Xray ordered.  Advised patient to go seek emergency care if persistent or worsening symptoms.      Relevant Orders   B Nat Peptide   CBC with Differential/Platelet   DG Chest 2 View   Comprehensive metabolic panel with GFR   Wheezing   Prednisone  taper. Pt using Symbicort  daily and albuterol  as needed. Advised daily allergy medication (Zyrtec or Claritin daily).Return to clinic if worsening  symptoms (dyspnea, increased sputum production, fever).       Relevant Medications   predniSONE  (DELTASONE ) 10 MG tablet     I have discontinued [facility]'s furosemide , ALPRAZolam , methylPREDNISolone , ampicillin , and meloxicam . I am also having her start on nitrofurantoin  (macrocrystal-monohydrate) and predniSONE . Additionally, I am having her maintain her D2000 Ultra Strength, multivitamin with minerals, bismuth subsalicylate, Calcium  Carbonate Antacid (TUMS PO), diclofenac  Sodium, Iron  (Ferrous Sulfate ), Ocrevus , Probiotic Product (PROBIOTIC DAILY PO), B Complex Vitamins (VITAMIN B COMPLEX PO), Ascorbic Acid (VITAMIN C PO), acetaminophen , potassium chloride  SA, benzonatate , traMADol , pantoprazole , ondansetron , HYDROcodone -acetaminophen , albuterol , albuterol , pantoprazole , fluconazole , budesonide -formoterol , oxybutynin , busPIRone , olmesartan , gabapentin , modafinil , nystatin  cream, baclofen , and fluticasone . We will continue to administer sodium chloride  (PF).  Meds ordered this encounter  Medications   nitrofurantoin , macrocrystal-monohydrate, (MACROBID )  100 MG capsule    Sig: Take 1 capsule (100 mg total) by mouth 2 (two) times daily for 5 days.    Dispense:  10 capsule    Refill:  0    Supervising Provider:   Randie Bustle A [[id number]]   predniSONE  (DELTASONE ) 10 MG tablet    Sig: Take 4 tablets (40 mg total) by mouth daily with breakfast for 2 days, THEN 2 tablets (20 mg total) daily with breakfast for 2 days, THEN 1 tablet (10 mg total) daily with breakfast for 2 days.    Dispense:  14 tablet    Refill:  0    Supervising Provider:   Randie Bustle A [[id number]]

## 2024-01-22 NOTE — Assessment & Plan Note (Addendum)
 Getting CBC, BNP, CMP  today. Chest Xray ordered.  Advised patient to go seek emergency care if persistent or worsening symptoms.

## 2024-01-22 NOTE — Assessment & Plan Note (Addendum)
 Reports Frequent urination, urgency, and malodorous urine suggestive of UTI.  - urine culture pending. Starting macrobid .  Urine culture.  Consider urogyn referral as this is 2nd UTI in 5 months

## 2024-01-22 NOTE — Addendum Note (Signed)
 Addended by: Marylou Sobers D on: 01/22/2024 05:08 PM   Modules accepted: Orders

## 2024-01-23 ENCOUNTER — Telehealth: Payer: Self-pay | Admitting: Student

## 2024-01-23 ENCOUNTER — Encounter: Payer: Self-pay | Admitting: Student

## 2024-01-23 LAB — COMPREHENSIVE METABOLIC PANEL WITH GFR
ALT: 11 U/L (ref 0–35)
AST: 23 U/L (ref 0–37)
Albumin: 4.6 g/dL (ref 3.5–5.2)
Alkaline Phosphatase: 70 U/L (ref 39–117)
BUN: 12 mg/dL (ref 6–23)
CO2: 25 meq/L (ref 19–32)
Calcium: 10 mg/dL (ref 8.4–10.5)
Chloride: 94 meq/L — ABNORMAL LOW (ref 96–112)
Creatinine, Ser: 0.79 mg/dL (ref 0.40–1.20)
GFR: 79.52 mL/min (ref >60.00)
Glucose, Bld: 89 mg/dL (ref 70–99)
Potassium: 4.3 meq/L (ref 3.5–5.1)
Sodium: 134 meq/L — ABNORMAL LOW (ref 135–145)
Total Bilirubin: 0.9 mg/dL (ref 0.2–1.2)
Total Protein: 7.1 g/dL (ref 6.0–8.3)

## 2024-01-23 LAB — CBC WITH DIFFERENTIAL/PLATELET
Basophils Absolute: 0 10*3/uL (ref 0.0–0.1)
Basophils Relative: 0.5 % (ref 0.0–3.0)
Eosinophils Absolute: 0.2 10*3/uL (ref 0.0–0.7)
Eosinophils Relative: 3 % (ref 0.0–5.0)
HCT: 36.8 % (ref 36.0–46.0)
Hemoglobin: 12.6 g/dL (ref 12.0–15.0)
Lymphocytes Relative: 24.1 % (ref 12.0–46.0)
Lymphs Abs: 2 10*3/uL (ref 0.7–4.0)
MCHC: 34.2 g/dL (ref 30.0–36.0)
MCV: 96.8 fl (ref 78.0–100.0)
Monocytes Absolute: 1 10*3/uL (ref 0.1–1.0)
Monocytes Relative: 11.7 % (ref 3.0–12.0)
Neutro Abs: 5 10*3/uL (ref 1.4–7.7)
Neutrophils Relative %: 60.7 % (ref 43.0–77.0)
Platelets: 422 10*3/uL — ABNORMAL HIGH (ref 150.0–400.0)
RBC: 3.8 Mil/uL — ABNORMAL LOW (ref 3.87–5.11)
RDW: 13 % (ref 11.5–15.5)
WBC: 8.2 10*3/uL (ref 4.0–10.5)

## 2024-01-23 LAB — URINE CULTURE
MICRO NUMBER:: 16538254
SPECIMEN QUALITY:: ADEQUATE

## 2024-01-23 NOTE — Telephone Encounter (Signed)
 Left message on machine to call back to schedule lab appointment. Lab specimen was not sent out at right temp and we need to recollect.

## 2024-01-23 NOTE — Telephone Encounter (Signed)
 Good morning I called this pt and left a voicemail. She needs to schedule an appointment with the lab for a BNP. If she calls back see if she can come in today if not message(timeshia) and let me know if she was rescheduled for another day.

## 2024-01-23 NOTE — Telephone Encounter (Signed)
 Copied from CRM [id number]. Topic: Appointments - Scheduling Inquiry for Clinic >> Jan 23, 2024 10:24 AM Catherine Hubbard wrote: Reason for CRM: Patient returning call to speak to Timeshia , I tried to schedule her a lab visit but she declined and asked for a phone call because she didn't understand why she needed the lab visit.

## 2024-01-27 ENCOUNTER — Encounter: Payer: Self-pay | Admitting: Neurology

## 2024-01-27 NOTE — Telephone Encounter (Signed)
 Left message on machine to call back

## 2024-01-27 NOTE — Addendum Note (Signed)
 Addended by: Marylou Sobers D on: 01/27/2024 10:26 AM   Modules accepted: Orders

## 2024-01-30 ENCOUNTER — Encounter: Payer: Self-pay | Admitting: *Deleted

## 2024-02-04 ENCOUNTER — Ambulatory Visit (INDEPENDENT_AMBULATORY_CARE_PROVIDER_SITE_OTHER): Admitting: Orthopaedic Surgery

## 2024-02-04 ENCOUNTER — Other Ambulatory Visit (INDEPENDENT_AMBULATORY_CARE_PROVIDER_SITE_OTHER)

## 2024-02-04 DIAGNOSIS — M79642 Pain in left hand: Secondary | ICD-10-CM

## 2024-02-04 DIAGNOSIS — M79641 Pain in right hand: Secondary | ICD-10-CM

## 2024-02-04 MED ORDER — METHYLPREDNISOLONE ACETATE 40 MG/ML IJ SUSP
0.3000 mg | INTRAMUSCULAR | Status: AC | PRN
  Administered 2024-02-04: .3 mg

## 2024-02-04 MED ORDER — LIDOCAINE HCL 1 % IJ SOLN
0.3000 mL | INTRAMUSCULAR | Status: AC | PRN
  Administered 2024-02-04: .3 mL

## 2024-02-04 MED ORDER — METHYLPREDNISOLONE ACETATE 40 MG/ML IJ SUSP
13.3300 mg | INTRAMUSCULAR | Status: AC | PRN
Start: 2024-02-04 — End: 2024-02-04
  Administered 2024-02-04: 13.33 mg

## 2024-02-04 MED ORDER — BUPIVACAINE HCL 0.5 % IJ SOLN
0.3000 mL | INTRAMUSCULAR | Status: AC | PRN
  Administered 2024-02-04: .3 mL

## 2024-02-04 MED ORDER — BUPIVACAINE HCL 0.5 % IJ SOLN
0.3300 mL | INTRAMUSCULAR | Status: AC | PRN
Start: 2024-02-04 — End: 2024-02-04
  Administered 2024-02-04: .33 mL

## 2024-02-04 NOTE — Progress Notes (Signed)
 Office Visit Note   Patient: Catherine Hubbard           Date of Birth: 04/19/1961           MRN: [id number] Visit Date: 02/04/2024              Requested by: Liam Redhead, MD [phone] [address] [place],  [place] [address] PCP: Sylvia Everts, PA-C   Assessment & Plan: Visit Diagnoses:  1. Bilateral hand pain     Plan: History of Present Illness Catherine Hubbard is a 63 year old female who presents with severe pain and locking in both thumbs.  She experiences throbbing pain at the base of both thumbs, which is exacerbated by activities such as opening jars, grasping, and pinching. The locking is severe, occasionally preventing thumb movement. Constant numbness and tingling are present in the affected areas. The pain disrupts her sleep, often waking her with her hand outstretched. She has attempted using supportive braces, Advil , Aleve, and topical arthritis creams like Voltaren , which have provided some relief.  Physical Exam MUSCULOSKELETAL: Severe pain with bilateral CMC grind test.  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 Chronic condition with bone-on-bone contact and bone spurs. Conservative treatments provided some relief. Injection therapy considered temporary. Surgery is definitive treatment. - Administer injection for thumb arthritis. - Consider surgical intervention if injections become ineffective.  Follow-Up Instructions: No follow-ups on file.   Orders:  Orders Placed This Encounter  Procedures   XR Hand Complete Left   XR Hand Complete Right   No orders of the defined types were placed in this encounter.     Procedures: Hand/UE Inj: R thumb CMC for osteoarthritis on 02/04/2024 12:35 PM Indications: pain Details: 25 G needle Medications: 0.3 mL lidocaine  1 %; 0.3 mg methylPREDNISolone  acetate 40 MG/ML; 0.3 mL bupivacaine  0.5 % Outcome: tolerated well,  no immediate complications Patient was prepped and draped in the usual sterile fashion.    Hand/UE Inj: L thumb CMC for osteoarthritis on 02/04/2024 12:35 PM Indications: pain Details: 25 G needle Medications: 0.3 mL lidocaine  1 %; 0.33 mL bupivacaine  0.5 %; 13.33 mg methylPREDNISolone  acetate 40 MG/ML Outcome: tolerated well, no immediate complications      Subjective: Chief Complaint  Patient presents with   Right Hand - Pain   Left Hand - Pain    HPI  Review of Systems  Constitutional: Negative.   HENT: Negative.    Eyes: Negative.   Respiratory: Negative.    Cardiovascular: Negative.   Endocrine: Negative.   Musculoskeletal: Negative.   Neurological: Negative.   Hematological: Negative.   Psychiatric/Behavioral: Negative.    All other systems reviewed and are negative.    Objective: Vital Signs: LMP 08/13/2014 Comment: very few periods  Physical Exam Vitals and nursing note reviewed.  Constitutional:      Appearance: She is well-developed.  HENT:     Head: Atraumatic.     Nose: Nose normal.   Eyes:     Extraocular Movements: Extraocular movements intact.    Cardiovascular:     Pulses: Normal pulses.  Pulmonary:     Effort: Pulmonary effort is normal.  Abdominal:     Palpations: Abdomen is soft.   Musculoskeletal:     Cervical back: Neck supple.   Skin:    General: Skin is warm.     Capillary Refill: Capillary refill takes less than 2 seconds.  Neurological:     Mental Status: She is alert. Mental status is at baseline.   Psychiatric:        Behavior: Behavior normal.        Thought Content: Thought content normal.        Judgment: Judgment normal.     Ortho Exam  Specialty Comments:  No specialty comments available.  Imaging: XR Hand Complete Left Result Date: 02/04/2024 X-rays of the left hand show advanced osteoarthritis of the [facility] joint.  XR Hand Complete Right Result Date: 02/04/2024 X-rays of the right hand show severe  osteoarthritis of thumb CMC joint.    PMFS History: Patient Active Problem List   Diagnosis Date Noted   Wheezing 01/22/2024   SOB (shortness of breath) 01/22/2024   Osteoarthritis of left knee 08/29/2023   Leg swelling 06/25/2023   Chest wall hematoma 06/19/2023   Primary osteoarthritis of both knees 11/29/2022   Hematoma 11/29/2022   Post-traumatic osteoarthritis of right ankle 11/29/2022   Impingement syndrome of left ankle 11/29/2022   Iron  deficiency anemia 11/29/2022   Compression fracture of L1 lumbar vertebra (HCC) 11/29/2022   Estrogen deficiency 11/29/2022   Skin tear of forearm without complication, subsequent encounter 04/06/2022   Hyponatremia 03/28/2022   Frequent falls 03/28/2022   Hypomagnesemia 03/28/2022   Alcohol abuse 03/28/2022   GERD without esophagitis 03/28/2022   History of alcohol abuse 12/02/2019   Multiple rib fractures 04/14/2019   Multiple fractures of ribs, bilateral, initial encounter for closed fracture 04/14/2019   Closed 3-part fracture of proximal humerus with routine healing 04/01/2019   Multiple sclerosis exacerbation (HCC) 11/16/2018   Exacerbation of multiple sclerosis (HCC) 11/16/2018   Elevated troponin 09/26/2018   Acute hypokalemia 09/25/2018   Arthritis of carpometacarpal joint 03/27/2018   Nocturnal leg cramps 03/26/2018   Syncope due to orthostatic hypotension 02/12/2018   Diarrhea 02/08/2018   Urinary tract infection without hematuria 02/08/2018   Hematoma of scalp 02/08/2018   Neck pain 01/20/2018   Urine frequency 02/26/2017   Foreign body in esophagus    Esophageal stricture    Intractable nausea and vomiting 01/31/2017   Dysphagia    Severe protein-calorie malnutrition (HCC)    Potassium disorder 01/30/2017   Cystitis 01/30/2017   Periumbilical hernia 11/28/2016   Dysuria 11/28/2016   Calculus of gallbladder with acute on chronic cholecystitis without obstruction 11/28/2016   Costochondritis 05/11/2016   Chronic  chest wall pain 04/17/2016   Trochanteric bursitis of both hips 03/28/2016   Mild intermittent asthma without complication 11/10/2015   Screening for colon cancer 11/10/2015   Routine history and physical examination of adult 11/10/2015   Bilateral low back pain with bilateral sciatica 07/13/2015   Lumbar radicular pain 03/21/2015   Other fatigue 01/11/2015   Dysesthesia 01/11/2015   Urinary urgency 01/11/2015   Relapsing remitting multiple sclerosis (HCC) 10/06/2014   Numbness 10/06/2014   Ataxic gait 10/06/2014   High risk medication use 10/06/2014   Gastric bypass status for obesity 10/06/2014   Cognitive changes 10/06/2014   Depression with anxiety 10/06/2014   Restless leg 10/06/2014   Insomnia 10/06/2014   Essential hypertension 10/06/2014   Postoperative state 10/06/2014   Multiple sclerosis (HCC) 02/16/2014   Difficulty hearing 01/13/2014   Other muscle spasm 01/13/2014   Hearing loss 01/13/2014   Past Medical History:  Diagnosis Date   Alcohol abuse    Asthma    Esophagitis    GERD (gastroesophageal reflux disease)    Headache    Hearing  loss    Hypertension    Multiple sclerosis (HCC)    Pancreatitis    Vision abnormalities     Family History  Problem Relation Age of Onset   Cancer Mother    Stroke Mother    Cancer Father     Past Surgical History:  Procedure Laterality Date   ANKLE FRACTURE SURGERY Bilateral    CARPAL TUNNEL RELEASE Bilateral    CHOLECYSTECTOMY     COLONOSCOPY  11/2016   multiple   ESOPHAGOGASTRODUODENOSCOPY  11/2016   ESOPHAGOGASTRODUODENOSCOPY N/A 02/01/2017   Procedure: ESOPHAGOGASTRODUODENOSCOPY (EGD);  Surgeon: Kenney Peacemaker, MD;  Location: [facility] ENDOSCOPY;  Service: Endoscopy;  Laterality: N/A;   ESOPHAGOGASTRODUODENOSCOPY (EGD) WITH PROPOFOL  N/A 02/04/2017   Procedure: ESOPHAGOGASTRODUODENOSCOPY (EGD) WITH PROPOFOL ;  Surgeon: Asencion Blacksmith, MD;  Location: [facility] ENDOSCOPY;  Service: Endoscopy;  Laterality: N/A;   LAPAROSCOPIC  GASTRIC SLEEVE RESECTION  2011   ULNAR NERVE REPAIR Bilateral    Social History   Occupational History   Not on file  Tobacco Use   Smoking status: Never   Smokeless tobacco: Never   Tobacco comments:    second hand smoker when young. Heavy exposure.  Vaping Use   Vaping status: Never Used  Substance and Sexual Activity   Alcohol use: Yes    Alcohol/week: 6.0 standard drinks of alcohol    Types: 6 Cans of beer per week    Comment: daily, couple of shots ( weekly depending on day)   Drug use: No   Sexual activity: Not Currently

## 2024-02-06 ENCOUNTER — Encounter: Payer: Self-pay | Admitting: Medical

## 2024-02-08 ENCOUNTER — Other Ambulatory Visit: Payer: Self-pay | Admitting: Medical

## 2024-02-10 ENCOUNTER — Ambulatory Visit: Admitting: Neurology

## 2024-02-10 ENCOUNTER — Encounter: Payer: Self-pay | Admitting: Neurology

## 2024-02-10 VITALS — BP 109/71 | HR 66 | Ht 63.0 in | Wt 150.0 lb

## 2024-02-10 DIAGNOSIS — R260 Ataxic gait: Secondary | ICD-10-CM | POA: Diagnosis not present

## 2024-02-10 DIAGNOSIS — R3915 Urgency of urination: Secondary | ICD-10-CM

## 2024-02-10 DIAGNOSIS — I639 Cerebral infarction, unspecified: Secondary | ICD-10-CM | POA: Diagnosis not present

## 2024-02-10 DIAGNOSIS — R5383 Other fatigue: Secondary | ICD-10-CM

## 2024-02-10 DIAGNOSIS — Z79899 Other long term (current) drug therapy: Secondary | ICD-10-CM

## 2024-02-10 DIAGNOSIS — G35 Multiple sclerosis: Secondary | ICD-10-CM | POA: Diagnosis not present

## 2024-02-10 NOTE — Patient Instructions (Signed)
 Please check IgG and IgM levels Please check CBC WITH Diff

## 2024-02-10 NOTE — Progress Notes (Signed)
 [facility]  PATIENT: Catherine Hubbard DOB: 1960-09-02   _________________________________   HISTORICAL  CHIEF COMPLAINT:  Chief Complaint  Patient presents with   Follow-up    Pt in room 10. Alone.Here for MS DMT: Ocrevus . Next infusion date: due late Sept 2025. No recent falls, last eye exam was Jan 2025.  No concerns. Pt need refill on hydrocodone .    HISTORY OF PRESENT ILLNESS:  Catherine Hubbard is a 63 y.o. woman with relapsing remitting MS.    Update 08/12/2023: She denies any new neurologic symptoms.      On11/27/2024 she went to the ED with hand weakness ad MRI reportedly showed progression.   However, by my review,  no definite progression compared to 2020 in the brain.  She has had 2 small strokes since 2020 in the left temporal and right frontal lobe but they were present on the CT scan from 2023.     She is on Ocrevus  and denies any new exacerbations. She is tolerating it well.  Next infusion in September. Last year, CD20 count was 0 and IgG and IgM were normal.     We discussed that with very minimal change over 2-4 years on MRI, I do not think she is failing the Ocrevus .   She may not do better on any other treatment so for now will continue with it.     She feels gait is stable and no definite worsening. She is walking without her cane at times and with it for longer distance (in car right now).  She has no recent falls.  She uses the banister on stairs.  She has mild weakness in her legs.    She denies numbness but ha mild tingling at times in hands.   Hands are sometimes clumsy  Vision is doing well.      She has urinary urgency and no recent incontinence.  . She takes oxybutynin  if she leaves the house.  She reports being fatigued.  Modafinil  helps some.  She did better on 1-1/2 pills a day instead of 1 pill a day.  Mood has been stable.  She reports she is rarely drinking ETOH now.     Sleep varies.       She has some word finding errors    She has spine pain and knee pain but tramadol  has not helped - does make her sleepy.   She rarely takes.   Hydrocodone  has not helped much eithr and she takes if knee acts up more  MS History:   She was diagnosed with multiple sclerosis in 2009. Before that time, she had noted some gait ataxia but had not had any imaging studies. In 2009, due to progressive hearing loss, she had an MRI of the brain and  then had a lumbar puncture that also confirmed the diagnosis.  I have some of her early MRI reports there are several foci noted in the cervical and thoracic spinal cord in the brain, there were multiple hyperintense foci, mostly in the periventricular deep white matter many of these were contrast enhancing on 07/26/2008. There was another enhancing lesion adjacent to C2 in the spinal cord.  Initially, she was placed on Avonex . She did not feel good and she stopped. She then tried Copaxone but also stopped after a while. She moved here in 2014 and saw Dr. Clarice at [facility]. She was placed on Rebif . She tolerates Rebif  well.        IMAGING: MRI from  12/04/2012  showed multiple foci in the spinal cord at C2, C3-C4, and C4-C5. There were also foci at T4, T7, T9 and T10. The MRI of the brain showed 2 nonenhancing foci not present from studies in 2011. She reports having an MRI at [facility] 2015 but we do not have those films.  MRI cervical spine 07/22/2021 shows foci at C2, C4 and C5, seen previously.   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   MRI brain 11/16/2018 showed MS lesion with no progression compared to 2018.    MRI of the brain 07/17/2023 showed MS lesions in the cerebral hemispheres very slightly progressed compared to the 2020 MRI.  Additional foci noted in the pons and adjacent to C2 in the spinal cord.  There also were small chronic infarctions In the left lateral temporal lobe and right frontal lobe, not clearly seen on the previous MRI from 2020 but present on the CT scan  from 12/06/2018 2020.  No acute findings.  MRI of the cervical spine 07/17/2023 showed T2 hyperintense foci centrally at C2, to the right adjacent to C3-C4, posteriorly adjacent to C4-C5, posteriorly to the left adjacent to C5 and centrally adjacent to C5-C6.  None of these appear to be acute.  There was mild spinal stenosis at C5-C6.  No nerve root compression.   REVIEW OF SYSTEMS:  Constitutional: No fevers, chills, sweats, or change in appetite.    She reports fatigue and insomnia Eyes: see above.  No eye pain  But some light sensitivity. Ear, nose and throat: No hearing loss, ear pain, nasal congestion, sore throat.   tinnitus Cardiovascular: No chest pain, palpitations Respiratory:  No shortness of breath at rest or with exertion.   Some wheezes, once on asthma med's Gastrointestinal: No nausea, vomiting, diarrhea, abdominal pain, fecal incontinence.  She has esophagitis Genitourinary:  see above Musculoskeletal:  see above  She also notes neck pain Integumentary: No rash, pruritus, skin lesions Neurological: as above Psychiatric: as above Endocrine: No palpitations, diaphoresis, change in appetite, change in weigh or increased thirst.  Often feels cold Hematologic/Lymphatic:  No anemia, purpura, petechiae. Allergic/Immunologic: No itchy/runny eyes, nasal congestion, recent allergic reactions, rashes  ALLERGIES: Allergies  Allergen Reactions   Ibuprofen  Other (See Comments)    Cannot take due to gastric bypass   Nsaids Other (See Comments)    Can not take due to Gastric bypass    HOME MEDICATIONS:  Current Outpatient Medications:    acetaminophen  (TYLENOL ) 500 MG tablet, Take 2 tablets (1,000 mg total) by mouth every 6 (six) hours as needed for mild pain (pain score 1-3)., Disp: , Rfl:    albuterol  (PROVENTIL ) (2.5 MG/3ML) 0.083% nebulizer solution, Take 3 mLs (2.5 mg total) by nebulization every 6 (six) hours as needed for wheezing or shortness of breath., Disp: 150 mL, Rfl:  0   albuterol  (VENTOLIN  HFA) 108 (90 Base) MCG/ACT inhaler, Inhale 2 puffs into the lungs every 6 (six) hours as needed for wheezing or shortness of breath., Disp: 18 g, Rfl: 1   Ascorbic Acid (VITAMIN C PO), Take 1 tablet by mouth daily. 1 tablet daily, Disp: , Rfl:    B Complex Vitamins (VITAMIN B COMPLEX PO), Take 1 tablet by mouth daily. 1 daily, Disp: , Rfl:    baclofen  (LIORESAL ) 10 MG tablet, TAKE 1 TABLET BY MOUTH THREE TIMES A DAY AS NEEDED FOR MUSCLE SPASM, Disp: 270 tablet, Rfl: 0   benzonatate  (TESSALON ) 100 MG capsule, Take 1 capsule (  100 mg total) by mouth 3 (three) times daily as needed for cough., Disp: 30 capsule, Rfl: 0   bismuth subsalicylate (PEPTO BISMOL) 262 MG/15ML suspension, Take 30 mLs by mouth daily as needed for indigestion or diarrhea or loose stools (acid reflux)., Disp: , Rfl:    budesonide -formoterol  (SYMBICORT ) 160-4.5 MCG/ACT inhaler, Inhale 2 puffs into the lungs 2 (two) times daily., Disp: 1 each, Rfl: 3   busPIRone  (BUSPAR ) 7.5 MG tablet, TAKE 2 TABLETS (15 MG TOTAL) BY MOUTH 2 (TWO) TIMES DAILY., Disp: 360 tablet, Rfl: 0   Calcium  Carbonate Antacid (TUMS PO), Take 2 tablets by mouth 2 (two) times daily as needed (acid reflux)., Disp: , Rfl:    Cholecalciferol (D2000 ULTRA STRENGTH) 50 MCG (2000 UT) CAPS, Take 2,000 Units by mouth daily., Disp: , Rfl:    diclofenac  Sodium (VOLTAREN ) 1 % GEL, Apply 4 g topically 4 (four) times daily. (Patient taking differently: Apply 4 g topically 4 (four) times daily as needed (joint pain).), Disp: 400 g, Rfl: 1   fluticasone  (FLONASE ) 50 MCG/ACT nasal spray, SPRAY 2 SPRAYS INTO EACH NOSTRIL EVERY DAY, Disp: 48 mL, Rfl: 3   gabapentin  (NEURONTIN ) 100 MG capsule, TAKE 1 CAPSULE BY MOUTH THREE TIMES A DAY, Disp: 90 capsule, Rfl: 1   HYDROcodone -acetaminophen  (NORCO) 5-325 MG tablet, 1 tab po q 6 hours prn severe pain, Disp: 30 tablet, Rfl: 0   Iron , Ferrous Sulfate , 325 (65 Fe) MG TABS, 1 tab po bid (Patient taking differently: Take  1 tablet by mouth in the morning and at bedtime. 1 tab po bid), Disp: 60 tablet, Rfl: 3   modafinil  (PROVIGIL ) 200 MG tablet, TAKE 1 TABLET BY MOUTH IN THE MORNING AND 1/2 TABLET IN THE EVENING. Call [phone] to schedule follow up around 02/10/24 for ongoing refills., Disp: 45 tablet, Rfl: 2   Multiple Vitamin (MULTIVITAMIN WITH MINERALS) TABS tablet, Take 1 tablet by mouth daily., Disp: , Rfl:    nystatin  cream (MYCOSTATIN ), Apply 1 Application topically 2 (two) times daily., Disp: 30 g, Rfl: 1   ocrelizumab  (OCREVUS ) 300 MG/10ML injection, INFUSE 600MG  INTRAVENOUSLY AS DIRECTED EVERY 6 MONTHS. (Patient taking differently: Inject 600 mg into the vein every 6 (six) months.), Disp: 20 mL, Rfl: 1   olmesartan  (BENICAR ) 5 MG tablet, TAKE 2 TABLETS BY MOUTH EVERY DAY, Disp: 180 tablet, Rfl: 3   oxybutynin  (DITROPAN ) 5 MG tablet, TAKE 1 TABLET BY MOUTH TWICE A DAY, Disp: 180 tablet, Rfl: 0   Probiotic Product (PROBIOTIC DAILY PO), Take 1 tablet by mouth 3 (three) times a week. 1 daily, Disp: , Rfl:    fluconazole  (DIFLUCAN ) 150 MG tablet, Take 1 tablet (150 mg total) by mouth daily. (Patient not taking: Reported on 02/10/2024), Disp: 1 tablet, Rfl: 0   ondansetron  (ZOFRAN ) 4 MG tablet, Take 1 tablet (4 mg total) by mouth every 6 (six) hours. (Patient not taking: Reported on 02/10/2024), Disp: 12 tablet, Rfl: 0   pantoprazole  (PROTONIX ) 40 MG tablet, Take 1 tablet (40 mg total) by mouth daily. (Patient not taking: Reported on 02/10/2024), Disp: 30 tablet, Rfl: 0   pantoprazole  (PROTONIX ) 40 MG tablet, Take 1 tablet (40 mg total) by mouth daily. (Patient not taking: Reported on 02/10/2024), Disp: 30 tablet, Rfl: 3   potassium chloride  SA (KLOR-CON  M) 20 MEQ tablet, Take 1 tablet (20 mEq total) by mouth daily. (Patient not taking: Reported on 02/10/2024), Disp: 90 tablet, Rfl: 3   traMADol  (ULTRAM ) 50 MG tablet, 1 tab po bid prn severe pain (  Patient not taking: Reported on 02/10/2024), Disp: 10 tablet, Rfl:  0  Current Facility-Administered Medications:    sodium chloride  (PF) 0.9 % injection 2 mL, 2 mL, Intravenous, PRN,   PAST MEDICAL HISTORY: Past Medical History:  Diagnosis Date   Alcohol abuse    Asthma    Esophagitis    GERD (gastroesophageal reflux disease)    Headache    Hearing loss    Hypertension    Multiple sclerosis (HCC)    Pancreatitis    Vision abnormalities     PAST SURGICAL HISTORY: Past Surgical History:  Procedure Laterality Date   ANKLE FRACTURE SURGERY Bilateral    CARPAL TUNNEL RELEASE Bilateral    CHOLECYSTECTOMY     COLONOSCOPY  11/2016   multiple   ESOPHAGOGASTRODUODENOSCOPY  11/2016   ESOPHAGOGASTRODUODENOSCOPY N/A 02/01/2017   Procedure: ESOPHAGOGASTRODUODENOSCOPY (EGD);  Surgeon: Avram Lupita BRAVO, MD;  Location: [facility] ENDOSCOPY;  Service: Endoscopy;  Laterality: N/A;   ESOPHAGOGASTRODUODENOSCOPY (EGD) WITH PROPOFOL  N/A 02/04/2017   Procedure: ESOPHAGOGASTRODUODENOSCOPY (EGD) WITH PROPOFOL ;  Surgeon: Aneita Gwendlyn DASEN, MD;  Location[facility] ENDOSCOPY;  Service: Endoscopy;  Laterality: N/A;   LAPAROSCOPIC GASTRIC SLEEVE RESECTION  2011   ULNAR NERVE REPAIR Bilateral     FAMILY HISTORY: Family History  Problem Relation Age of Onset   Cancer Mother    Stroke Mother    Cancer Father     SOCIAL HISTORY:  Social History   Socioeconomic History   Marital status: Married    Spouse name: Not on file   Number of children: Not on file   Years of education: Not on file   Highest education level: Not on file  Occupational History   Not on file  Tobacco Use   Smoking status: Never   Smokeless tobacco: Never   Tobacco comments:    second hand smoker when young. Heavy exposure.  Vaping Use   Vaping status: Never Used  Substance and Sexual Activity   Alcohol use: Yes    Alcohol/week: 6.0 standard drinks of alcohol    Types: 6 Cans of beer per week    Comment: daily, couple of shots ( weekly depending on day)   Drug use: No   Sexual activity: Not  Currently  Other Topics Concern   Not on file  Social History Narrative   Not on file   Social Drivers of Health   Financial Resource Strain: Not on file  Food Insecurity: Low Risk  (10/21/2023)   Received from [facility]   Hunger Vital Sign    Within the past 12 months, you worried that your food would run out before you got money to buy more: Never true    Within the past 12 months, the food you bought just didn't last and you didn't have money to get more. : Never true  Transportation Needs: No Transportation Needs (10/21/2023)   Received from [facility]    In the past 12 months, has lack of reliable transportation kept you from medical appointments, meetings, work or from getting things needed for daily living? : No  Physical Activity: Not on file  Stress: Not on file  Social Connections: Not on file  Intimate Partner Violence: Not At Risk (06/19/2023)   Humiliation, Afraid, Rape, and Kick questionnaire    Fear of Current or Ex-Partner: No    Emotionally Abused: No    Physically Abused: No    Sexually Abused: No     PHYSICAL EXAM  Vitals:   02/10/24  1252  BP: 109/71  Pulse: 66  Weight: 150 lb (68 kg)  Height: 5' 3 (1.6 m)     Body mass index is 26.57 kg/m.   General: The patient is well-developed and well-nourished and in no acute distress.     Neck/HEENT:   She has mild tenderness in the cervical paraspinal muscles.      Extremities:  Mild pedal edema   Neurologic Exam  Mental status: The patient is alert and oriented x 3 at the time of the examination. The patient has apparent normal recent and remote memory, with an apparently normal attention span and concentration ability.   Speech is normal.  Cranial nerves: Extraocular movements are full.  . She reports reduced sensation in the right face.  Facial strength was normal.  The trapezius strength is normal.      Motor:  Muscle bulk is normal.   Tone is normal in arms and mildly  increased in the legs..  Strength is 5/5 in the arms and legs.  Sensory: She has symmetric sensation to touch and vibration in the arms and legs/feet.  Coordination: Cerebellar testing shows good finger-nose-finger and right heel-to-shin but mildly reduced left heel-to-shin (but has knee pain).  Gait and station: Station is normal.  The gait is mildly wide and has a mildly reduced stride.  Favors left knee.   She is unable to do a tandem gait.   Romberg is negative..  Reflexes: Deep tendon reflexes are symmetric and mildly increased in legs bilaterally.   No ankle clonus      ASSESSMENT AND PLAN  Multiple sclerosis (HCC) - Plan: IgG, IgA, IgM, CBC with Differential/Platelet  High risk medication use - Plan: IgG, IgA, IgM, CBC with Differential/Platelet  Cerebrovascular accident (CVA), unspecified mechanism (HCC)  Ataxic gait  Other fatigue  Urinary urgency   1.    Continue Ocrevus , next infusion in September 2025.  IgG/IgM and CBC/Diff were fine 03/2023.  She needs these rechecked before September infusion - she prefers to do at PCP office as getting labs next week.  2.    Continue baclofen , Provigil  and oxybutynin . 3.  Stay active and exercise as tolerated.   Use cane for longer distance 4.   She will return to see us  in 6 months or sooner if she has new or worsening neurologic symptoms.    Makela Niehoff A. Vear, MD, PhD, FAAN Certified in Neurology, Clinical Neurophysiology, Sleep Medicine, Pain Medicine and Neuroimaging Director, Multiple Sclerosis Center at [facility]  [facility] [address], [address] [place], [place] [address] [phone]

## 2024-02-11 ENCOUNTER — Encounter: Payer: Self-pay | Admitting: Neurology

## 2024-02-14 ENCOUNTER — Encounter: Payer: Self-pay | Admitting: Medical

## 2024-02-17 NOTE — Addendum Note (Signed)
 Addended by: DORINA DALLAS HERO on: 02/17/2024 01:04 PM   Modules accepted: Orders

## 2024-02-17 NOTE — Telephone Encounter (Signed)
 Pt is unable to get her labs done at our office because neuro is not apart of [facility]

## 2024-02-18 ENCOUNTER — Ambulatory Visit: Admitting: Physical Medicine and Rehabilitation

## 2024-02-19 ENCOUNTER — Other Ambulatory Visit: Payer: Self-pay | Admitting: Medical

## 2024-02-20 NOTE — Telephone Encounter (Signed)
 Tried calling [facility] at [phone] x2. Number no longer in service. I called [phone] and spoke w/ Kayla. States as of 02/14/24 they could not reach pt to schedule infusion due in September. Aware we will call pt and provide phone# to call and schedule her infusion. She verbalized understanding.  I called pt at [phone]. LVM for her to call office. Please ask her to call [phone]/[facility] to schedule her infusion due in September if she calls back.

## 2024-02-20 NOTE — Telephone Encounter (Signed)
 Pt has returned call to [place], [place]

## 2024-02-24 ENCOUNTER — Encounter: Payer: Self-pay | Admitting: Medical

## 2024-02-26 ENCOUNTER — Ambulatory Visit: Admitting: Medical

## 2024-02-26 ENCOUNTER — Other Ambulatory Visit

## 2024-02-26 ENCOUNTER — Ambulatory Visit: Payer: Self-pay | Admitting: Medical

## 2024-02-26 ENCOUNTER — Encounter: Payer: Self-pay | Admitting: Medical

## 2024-02-26 VITALS — BP 135/88 | HR 100 | Ht 63.0 in | Wt 154.0 lb

## 2024-02-26 DIAGNOSIS — M542 Cervicalgia: Secondary | ICD-10-CM

## 2024-02-26 DIAGNOSIS — R7989 Other specified abnormal findings of blood chemistry: Secondary | ICD-10-CM

## 2024-02-26 DIAGNOSIS — G35 Multiple sclerosis: Secondary | ICD-10-CM | POA: Diagnosis not present

## 2024-02-26 DIAGNOSIS — M545 Low back pain, unspecified: Secondary | ICD-10-CM

## 2024-02-26 DIAGNOSIS — R350 Frequency of micturition: Secondary | ICD-10-CM | POA: Diagnosis not present

## 2024-02-26 DIAGNOSIS — R300 Dysuria: Secondary | ICD-10-CM

## 2024-02-26 DIAGNOSIS — M25562 Pain in left knee: Secondary | ICD-10-CM | POA: Diagnosis not present

## 2024-02-26 DIAGNOSIS — G8929 Other chronic pain: Secondary | ICD-10-CM

## 2024-02-26 LAB — POC URINALSYSI DIPSTICK (AUTOMATED)
Bilirubin, UA: NEGATIVE
Blood, UA: NEGATIVE
Glucose, UA: NEGATIVE
Ketones, UA: NEGATIVE
Protein, UA: NEGATIVE
Spec Grav, UA: 1.01 (ref 1.010–1.025)
Urobilinogen, UA: 0.2 U/dL
pH, UA: 6 (ref 5.0–8.0)

## 2024-02-26 MED ORDER — HYDROCODONE-ACETAMINOPHEN 5-325 MG PO TABS: ORAL_TABLET | ORAL | 0 refills | Status: DC

## 2024-02-26 MED ORDER — CEPHALEXIN 500 MG PO CAPS
500.0000 mg | ORAL_CAPSULE | Freq: Two times a day (BID) | ORAL | 0 refills | Status: DC
Start: 2024-02-26 — End: 2024-04-26

## 2024-02-26 NOTE — Progress Notes (Signed)
 Subjective:    Patient ID: Catherine Hubbard, female    DOB: 1961-06-12, 63 y.o.   MRN: [id number]  HPI  Pt in reporting urinary complaints and describes severe chronic intermittent pains.    Day and half of frequent urination with urgency, pain and odor. Chill today in office but our rooms are very cold. No fever. Hx of uri in past. On review about 60% of time approximate over past year culture grew out bacteria. Other times did not.     Pt she is requesting refill of norco. She has various intemittent area of pain. Left knee, low back and neck pain. She states 10 tab of nocro had lasted about 7 months. She will use 1/4-1/2 tab on occasion if pain is excessive.  Mri of knee past below in 08/27/2023 IMPRESSION: 1. Horizontal tear of the medial meniscus body and posterior horn. 2. Small radial tear of the lateral meniscus midbody. 3. 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 4. Small to moderate joint effusion with 4 mm intra-articular body in the posterior joint space.     Electronically Signed   By: Elsie ONEIDA Shoulder M.D.   On: 08/27/2023 10:26  IMPRESSION: 1. Multiple T2 hyperintense lesions within the cervical spinal cord, consistent with the clinical diagnosis of multiple sclerosis. The lesion at the level of C5 on the left side is new since prior MRI. No enhancing lesion identified. 2. Degenerative changes of the cervical spine with moderate right and severe left neural foraminal narrowing at C5-6.  IMPRESSION: 1. No acute osseous abnormality. 2. Chronic L1 compression fracture. 3. Lumbar disc and facet degeneration with mild-to-moderate spinal stenosis at L4-5. 4. Partially visualized right adnexal cystic lesion which has increased in size from 2020, now measuring at least 4.5 cm in diameter. Nonemergent pelvic ultrasound is recommended for further characterization.  Pt sees specialist. Not getting pain med from them. Not  under contract. -she get trigger point injections. She has  Cervical myofascial pain syndrome: -the trigger point injections helped previously -She continues to have tension and would like to repeat the trigger point injections next visit.   Review of Systems  Constitutional:  Negative for chills, fatigue and fever.  Respiratory:  Negative for cough, chest tightness and wheezing.   Cardiovascular:  Negative for chest pain and palpitations.  Gastrointestinal:  Negative for abdominal pain, blood in stool and rectal pain.  Genitourinary:  Positive for dysuria, frequency and urgency.       Odor to urine. Urge to urinate. Symptoms for day and half   Musculoskeletal:  Negative for back pain.  Skin:  Negative for rash.  Neurological:  Negative for facial asymmetry and numbness.  Hematological:  Negative for adenopathy. Does not bruise/bleed easily.  Psychiatric/Behavioral:  Negative for behavioral problems and confusion. The patient is not nervous/anxious.      Past Medical History:  Diagnosis Date   Alcohol abuse    Asthma    Esophagitis    GERD (gastroesophageal reflux disease)    Headache    Hearing loss    Hypertension    Multiple sclerosis (HCC)    Pancreatitis    Vision abnormalities      Social History   Socioeconomic History   Marital status: Married    Spouse name: Not on file   Number of children: Not on file   Years of education: Not on file   Highest education level: Not on file  Occupational History   Not on file  Tobacco Use  Smoking status: Never   Smokeless tobacco: Never   Tobacco comments:    second hand smoker when young. Heavy exposure.  Vaping Use   Vaping status: Never Used  Substance and Sexual Activity   Alcohol use: Yes    Alcohol/week: 6.0 standard drinks of alcohol    Types: 6 Cans of beer per week    Comment: daily, couple of shots ( weekly depending on day)   Drug use: No   Sexual activity: Not Currently  Other Topics Concern   Not  on file  Social History Narrative   Not on file   Social Drivers of Health   Financial Resource Strain: Not on file  Food Insecurity: Low Risk  (10/21/2023)   Received from [facility]   Hunger Vital Sign    Within the past 12 months, you worried that your food would run out before you got money to buy more: Never true    Within the past 12 months, the food you bought just didn't last and you didn't have money to get more. : Never true  Transportation Needs: No Transportation Needs (10/21/2023)   Received from [facility]    In the past 12 months, has lack of reliable transportation kept you from medical appointments, meetings, work or from getting things needed for daily living? : No  Physical Activity: Not on file  Stress: Not on file  Social Connections: Not on file  Intimate Partner Violence: Not At Risk (06/19/2023)   Humiliation, Afraid, Rape, and Kick questionnaire    Fear of Current or Ex-Partner: No    Emotionally Abused: No    Physically Abused: No    Sexually Abused: No    Past Surgical History:  Procedure Laterality Date   ANKLE FRACTURE SURGERY Bilateral    CARPAL TUNNEL RELEASE Bilateral    CHOLECYSTECTOMY     COLONOSCOPY  11/2016   multiple   ESOPHAGOGASTRODUODENOSCOPY  11/2016   ESOPHAGOGASTRODUODENOSCOPY N/A 02/01/2017   Procedure: ESOPHAGOGASTRODUODENOSCOPY (EGD);  Surgeon: Avram Lupita BRAVO, MD;  Location: [facility] ENDOSCOPY;  Service: Endoscopy;  Laterality: N/A;   ESOPHAGOGASTRODUODENOSCOPY (EGD) WITH PROPOFOL  N/A 02/04/2017   Procedure: ESOPHAGOGASTRODUODENOSCOPY (EGD) WITH PROPOFOL ;  Surgeon: Aneita Gwendlyn DASEN, MD;  Location: [facility] ENDOSCOPY;  Service: Endoscopy;  Laterality: N/A;   LAPAROSCOPIC GASTRIC SLEEVE RESECTION  2011   ULNAR NERVE REPAIR Bilateral     Family History  Problem Relation Age of Onset   Cancer Mother    Stroke Mother    Cancer Father     Allergies  Allergen Reactions   Ibuprofen  Other (See Comments)    Cannot take due  to gastric bypass   Nsaids Other (See Comments)    Can not take due to Gastric bypass    Current Outpatient Medications on File Prior to Visit  Medication Sig Dispense Refill   acetaminophen  (TYLENOL ) 500 MG tablet Take 2 tablets (1,000 mg total) by mouth every 6 (six) hours as needed for mild pain (pain score 1-3).     albuterol  (PROVENTIL ) (2.5 MG/3ML) 0.083% nebulizer solution Take 3 mLs (2.5 mg total) by nebulization every 6 (six) hours as needed for wheezing or shortness of breath. 150 mL 0   albuterol  (VENTOLIN  HFA) 108 (90 Base) MCG/ACT inhaler Inhale 2 puffs into the lungs every 6 (six) hours as needed for wheezing or shortness of breath. 18 g 1   Ascorbic Acid (VITAMIN C PO) Take 1 tablet by mouth daily. 1 tablet daily     B  Complex Vitamins (VITAMIN B COMPLEX PO) Take 1 tablet by mouth daily. 1 daily     baclofen  (LIORESAL ) 10 MG tablet TAKE 1 TABLET BY MOUTH THREE TIMES A DAY AS NEEDED FOR MUSCLE SPASM 270 tablet 0   benzonatate  (TESSALON ) 100 MG capsule Take 1 capsule (100 mg total) by mouth 3 (three) times daily as needed for cough. 30 capsule 0   bismuth subsalicylate (PEPTO BISMOL) 262 MG/15ML suspension Take 30 mLs by mouth daily as needed for indigestion or diarrhea or loose stools (acid reflux).     budesonide -formoterol  (SYMBICORT ) 160-4.5 MCG/ACT inhaler Inhale 2 puffs into the lungs 2 (two) times daily. 1 each 3   busPIRone  (BUSPAR ) 7.5 MG tablet TAKE 2 TABLETS (15 MG TOTAL) BY MOUTH 2 (TWO) TIMES DAILY. 360 tablet 0   Calcium  Carbonate Antacid (TUMS PO) Take 2 tablets by mouth 2 (two) times daily as needed (acid reflux).     Cholecalciferol (D2000 ULTRA STRENGTH) 50 MCG (2000 UT) CAPS Take 2,000 Units by mouth daily.     diclofenac  Sodium (VOLTAREN ) 1 % GEL Apply 4 g topically 4 (four) times daily. (Patient taking differently: Apply 4 g topically 4 (four) times daily as needed (joint pain).) 400 g 1   fluticasone  (FLONASE ) 50 MCG/ACT nasal spray SPRAY 2 SPRAYS INTO EACH  NOSTRIL EVERY DAY 48 mL 3   gabapentin  (NEURONTIN ) 100 MG capsule TAKE 1 CAPSULE BY MOUTH THREE TIMES A DAY 90 capsule 1   HYDROcodone -acetaminophen  (NORCO) 5-325 MG tablet 1 tab po q 6 hours prn severe pain 30 tablet 0   Iron , Ferrous Sulfate , 325 (65 Fe) MG TABS 1 tab po bid (Patient taking differently: Take 1 tablet by mouth in the morning and at bedtime. 1 tab po bid) 60 tablet 3   modafinil  (PROVIGIL ) 200 MG tablet TAKE 1 TABLET BY MOUTH IN THE MORNING AND 1/2 TABLET IN THE EVENING. Call [phone] to schedule follow up around 02/10/24 for ongoing refills. 45 tablet 2   Multiple Vitamin (MULTIVITAMIN WITH MINERALS) TABS tablet Take 1 tablet by mouth daily.     nystatin  cream (MYCOSTATIN ) Apply 1 Application topically 2 (two) times daily. 30 g 1   ocrelizumab  (OCREVUS ) 300 MG/10ML injection INFUSE 600MG  INTRAVENOUSLY AS DIRECTED EVERY 6 MONTHS. (Patient taking differently: Inject 600 mg into the vein every 6 (six) months.) 20 mL 1   olmesartan  (BENICAR ) 5 MG tablet TAKE 2 TABLETS BY MOUTH EVERY DAY 180 tablet 3   oxybutynin  (DITROPAN ) 5 MG tablet TAKE 1 TABLET BY MOUTH TWICE A DAY 180 tablet 0   pantoprazole  (PROTONIX ) 40 MG tablet TAKE 1 TABLET BY MOUTH EVERY DAY 30 tablet 3   Probiotic Product (PROBIOTIC DAILY PO) Take 1 tablet by mouth 3 (three) times a week. 1 daily     fluconazole  (DIFLUCAN ) 150 MG tablet Take 1 tablet (150 mg total) by mouth daily. (Patient not taking: Reported on 02/26/2024) 1 tablet 0   ondansetron  (ZOFRAN ) 4 MG tablet Take 1 tablet (4 mg total) by mouth every 6 (six) hours. (Patient not taking: Reported on 02/26/2024) 12 tablet 0   pantoprazole  (PROTONIX ) 40 MG tablet Take 1 tablet (40 mg total) by mouth daily. (Patient not taking: Reported on 02/26/2024) 30 tablet 0   potassium chloride  SA (KLOR-CON  M) 20 MEQ tablet Take 1 tablet (20 mEq total) by mouth daily. (Patient not taking: Reported on 02/26/2024) 90 tablet 3   traMADol  (ULTRAM ) 50 MG tablet 1 tab po bid prn severe  pain (Patient not taking:  Reported on 02/26/2024) 10 tablet 0   Current Facility-Administered Medications on File Prior to Visit  Medication Dose Route Frequency Provider Last Rate Last Admin   sodium chloride  (PF) 0.9 % injection 2 mL  2 mL Intravenous PRN         BP 135/88 (BP Location: Right Arm, Patient Position: Sitting, Cuff Size: Normal)   Pulse 100   Ht 5' 3 (1.6 m)   Wt 154 lb (69.9 kg)   LMP 08/13/2014 Comment: very few periods  SpO2 98%   BMI 27.28 kg/m        Objective:   Physical Exam  General Mental Status- Alert. General Appearance- Not in acute distress.   Skin General: Color- Normal Color. Moisture- Normal Moisture.  Neck Carotid Arteries- Normal color. Moisture- Normal Moisture. No carotid bruits. No JVD.  Chest and Lung Exam Auscultation: Breath Sounds:-Normal.  Cardiovascular Auscultation:Rythm- Regular. Murmurs & Other Heart Sounds:Auscultation of the heart reveals- No Murmurs.  Abdomen Inspection:-Inspeection Normal. Palpation/Percussion:Note:No mass. Palpation and Percussion of the abdomen reveal- faint suprapubic Tender, Non Distended + BS, no rebound or guarding.  Neurologic Cranial Nerve exam:- CN III-XII intact(No nystagmus), symmetric smile. Strength:- 5/5 equal and symmetric strength both upper and lower extremities.   Lower ext- calf symmetric, negative homans signs.    Assessment & Plan:   Patient Instructions   Dysuria (Primary) with  Frequent urination -Concern for early uti. Will rx keflex  500 mg twice daily for 7 days pending culture results. - POCT Urinalysis Dipstick (Automated) - Urine Culture  Chronic pain of left knee, Neck pain and Chronic midline low back pain without sciatica  -intermittent severe pain at times. Rarely high enough that needs norco, Sparing use over past 7 months.Will refill 10 tabs again.  -continue to see MD for trigger point injections. If needing high volume tabs routinely then defer to pain  specialist.  Future labs you had questions about(sent thru my chart) are ordered. Can get those today. Placed future labs on February 17, 2024.  MS -discussed lab neurologist wanted. Will try to order and draw in our office so you can do here rather than at specialist  Follow up appointment to be determined after lab review.   Quintessa Simmerman, PA-C   Time spent with patient today was 40  minutes which consisted of chart revdiew, discussing diagnosis, work up treatment and documentation.

## 2024-02-26 NOTE — Addendum Note (Signed)
 Addended by: DORINA DALLAS HERO on: 02/26/2024 02:23 PM   Modules accepted: Orders

## 2024-02-26 NOTE — Patient Instructions (Addendum)
 Dysuria (Primary) with  Frequent urination -Concern for early uti. Will rx keflex  500 mg twice daily for 7 days pending culture results. - POCT Urinalysis Dipstick (Automated) - Urine Culture  Chronic pain of left knee, Neck pain and Chronic midline low back pain without sciatica  -intermittent severe pain at times. Rarely high enough that needs norco, Sparing use over past 7 months.Will refill 10 tabs again.  -continue to see MD for trigger point injections. If needing high volume tabs routinely then defer to pain specialist.  Future labs you had questions about(sent thru my chart) are ordered. Can get those today. Placed future labs on February 17, 2024.  MS -discussed lab neurologist wanted. Will try to order and draw in our office so you can do here rather than at specialist  Follow up appointment to be determined after lab review.

## 2024-02-27 LAB — URINE CULTURE
MICRO NUMBER:: 16676798
SPECIMEN QUALITY:: ADEQUATE

## 2024-02-27 LAB — CBC WITH DIFFERENTIAL/PLATELET
Basophils Absolute: 0.1 K/uL (ref 0.0–0.1)
Basophils Relative: 1.3 % (ref 0.0–3.0)
Eosinophils Absolute: 0.3 K/uL (ref 0.0–0.7)
Eosinophils Relative: 3.5 % (ref 0.0–5.0)
HCT: 34.5 % — ABNORMAL LOW (ref 36.0–46.0)
Hemoglobin: 11.8 g/dL — ABNORMAL LOW (ref 12.0–15.0)
Lymphocytes Relative: 18.7 % (ref 12.0–46.0)
Lymphs Abs: 1.5 K/uL (ref 0.7–4.0)
MCHC: 34.3 g/dL (ref 30.0–36.0)
MCV: 97.1 fl (ref 78.0–100.0)
Monocytes Absolute: 1 K/uL (ref 0.1–1.0)
Monocytes Relative: 13.1 % — ABNORMAL HIGH (ref 3.0–12.0)
Neutro Abs: 5 K/uL (ref 1.4–7.7)
Neutrophils Relative %: 63.4 % (ref 43.0–77.0)
Platelets: 358 K/uL (ref 150.0–400.0)
RBC: 3.55 Mil/uL — ABNORMAL LOW (ref 3.87–5.11)
RDW: 13.1 % (ref 11.5–15.5)
WBC: 7.9 K/uL (ref 4.0–10.5)

## 2024-02-27 LAB — IGG, IGA, IGM
IgG (Immunoglobin G), Serum: 767 mg/dL (ref 600–1540)
IgM, Serum: 162 mg/dL (ref 50–300)
Immunoglobulin A: 229 mg/dL (ref 70–320)

## 2024-02-28 ENCOUNTER — Other Ambulatory Visit: Payer: Self-pay | Admitting: Medical

## 2024-02-28 ENCOUNTER — Other Ambulatory Visit

## 2024-02-28 LAB — BRAIN NATRIURETIC PEPTIDE: Pro B Natriuretic peptide (BNP): 61 pg/mL (ref 0.0–100.0)

## 2024-03-06 ENCOUNTER — Other Ambulatory Visit: Payer: Self-pay | Admitting: Neurology

## 2024-03-10 ENCOUNTER — Encounter: Attending: Physical Medicine and Rehabilitation | Admitting: Physical Medicine and Rehabilitation

## 2024-03-10 VITALS — BP 143/87 | HR 62 | Ht 63.0 in | Wt 153.0 lb

## 2024-03-10 DIAGNOSIS — M542 Cervicalgia: Secondary | ICD-10-CM | POA: Insufficient documentation

## 2024-03-10 MED ORDER — LIDOCAINE HCL 1 % IJ SOLN
5.0000 mL | Freq: Once | INTRAMUSCULAR | Status: AC
  Administered 2024-03-10: 5 mL via INTRADERMAL

## 2024-03-10 NOTE — Progress Notes (Signed)

## 2024-03-10 NOTE — Addendum Note (Signed)
 Addended by: LORILEE SVEN SQUIBB on: 03/10/2024 02:06 PM   Modules accepted: Orders

## 2024-03-10 NOTE — Patient Instructions (Signed)
 Foods that may reduce pain: 1) Ginger (especially studied for arthritis)- reduce leukotriene production to decrease inflammation 2) Blueberries- high in phytonutrients that decrease inflammation 3) Salmon- marine omega-3s reduce joint swelling and pain 4) Pumpkin seeds- reduce inflammation 5) dark chocolate- reduces inflammation 6) turmeric- reduces inflammation 7) tart cherries - reduce pain and stiffness 8) extra virgin olive oil - its compound olecanthal helps to block prostaglandins  9) chili peppers- can be eaten or applied topically via capsaicin 10) mint- helpful for headache, muscle aches, joint pain, and itching 11) garlic- reduces inflammation 12) Green tea- reduces inflammation and oxidative stress, helps with weight loss, may reduce the risk of cancer, recommend Double Green Matcha [place] of Tea daily  Link to further information on diet for chronic pain: [email or web address].[email or web address]

## 2024-03-18 ENCOUNTER — Other Ambulatory Visit: Payer: Self-pay | Admitting: Neurology

## 2024-03-18 NOTE — Telephone Encounter (Signed)
 Last seen on 02/10/24 Follow up scheduled on 09/14/24   Dispensed Days Supply Quantity Provider Pharmacy  MODAFINIL  200 MG TABLET 02/11/2024 30 45 each Sater, Charlie LABOR, MD [facility] [phone] - J...     Rx pending to be signed

## 2024-04-09 ENCOUNTER — Other Ambulatory Visit: Payer: Self-pay | Admitting: Medical

## 2024-04-18 ENCOUNTER — Other Ambulatory Visit: Payer: Self-pay | Admitting: Neurology

## 2024-04-21 ENCOUNTER — Other Ambulatory Visit: Payer: Self-pay | Admitting: Neurology

## 2024-04-21 DIAGNOSIS — G35 Multiple sclerosis: Secondary | ICD-10-CM

## 2024-04-21 NOTE — Telephone Encounter (Signed)
 Last seen on 02/10/24 Follow up scheduled on 09/09/24

## 2024-04-21 NOTE — Telephone Encounter (Signed)
 Last seen on 02/10/24 Follow up scheduled on 09/09/24   Dr.Sater was patient continue Rx? I didn't see it mentioned in last office note.

## 2024-04-23 ENCOUNTER — Encounter: Admitting: Physical Medicine & Rehabilitation

## 2024-04-25 ENCOUNTER — Emergency Department (HOSPITAL_BASED_OUTPATIENT_CLINIC_OR_DEPARTMENT_OTHER)

## 2024-04-25 ENCOUNTER — Other Ambulatory Visit: Payer: Self-pay

## 2024-04-25 ENCOUNTER — Inpatient Hospital Stay (HOSPITAL_BASED_OUTPATIENT_CLINIC_OR_DEPARTMENT_OTHER)
Admission: EM | Admit: 2024-04-25 | Discharge: 2024-04-28 | DRG: 184 | Disposition: A | Discharge status: Home / Self Care | Attending: General Surgery | Admitting: General Surgery

## 2024-04-25 ENCOUNTER — Encounter (HOSPITAL_BASED_OUTPATIENT_CLINIC_OR_DEPARTMENT_OTHER): Payer: Self-pay | Admitting: Emergency Medicine

## 2024-04-25 DIAGNOSIS — H9190 Unspecified hearing loss, unspecified ear: Secondary | ICD-10-CM | POA: Diagnosis not present

## 2024-04-25 DIAGNOSIS — I700 Atherosclerosis of aorta: Secondary | ICD-10-CM | POA: Diagnosis not present

## 2024-04-25 DIAGNOSIS — N83292 Other ovarian cyst, left side: Secondary | ICD-10-CM | POA: Diagnosis not present

## 2024-04-25 DIAGNOSIS — M48061 Spinal stenosis, lumbar region without neurogenic claudication: Secondary | ICD-10-CM | POA: Diagnosis not present

## 2024-04-25 DIAGNOSIS — R3911 Hesitancy of micturition: Secondary | ICD-10-CM | POA: Diagnosis present

## 2024-04-25 DIAGNOSIS — Z9181 History of falling: Secondary | ICD-10-CM

## 2024-04-25 DIAGNOSIS — Y92009 Unspecified place in unspecified non-institutional (private) residence as the place of occurrence of the external cause: Secondary | ICD-10-CM

## 2024-04-25 DIAGNOSIS — I10 Essential (primary) hypertension: Secondary | ICD-10-CM | POA: Diagnosis not present

## 2024-04-25 DIAGNOSIS — Z9884 Bariatric surgery status: Secondary | ICD-10-CM | POA: Diagnosis not present

## 2024-04-25 DIAGNOSIS — M5126 Other intervertebral disc displacement, lumbar region: Secondary | ICD-10-CM | POA: Diagnosis not present

## 2024-04-25 DIAGNOSIS — I712 Thoracic aortic aneurysm, without rupture, unspecified: Secondary | ICD-10-CM | POA: Diagnosis not present

## 2024-04-25 DIAGNOSIS — Z9049 Acquired absence of other specified parts of digestive tract: Secondary | ICD-10-CM | POA: Diagnosis not present

## 2024-04-25 DIAGNOSIS — W010XXA Fall on same level from slipping, tripping and stumbling without subsequent striking against object, initial encounter: Secondary | ICD-10-CM | POA: Diagnosis present

## 2024-04-25 DIAGNOSIS — R262 Difficulty in walking, not elsewhere classified: Secondary | ICD-10-CM | POA: Diagnosis not present

## 2024-04-25 DIAGNOSIS — R001 Bradycardia, unspecified: Secondary | ICD-10-CM | POA: Diagnosis not present

## 2024-04-25 DIAGNOSIS — K5730 Diverticulosis of large intestine without perforation or abscess without bleeding: Secondary | ICD-10-CM | POA: Diagnosis not present

## 2024-04-25 DIAGNOSIS — G35 Multiple sclerosis: Secondary | ICD-10-CM | POA: Diagnosis present

## 2024-04-25 DIAGNOSIS — S2241XA Multiple fractures of ribs, right side, initial encounter for closed fracture: Principal | ICD-10-CM | POA: Diagnosis present

## 2024-04-25 DIAGNOSIS — Z7951 Long term (current) use of inhaled steroids: Secondary | ICD-10-CM

## 2024-04-25 DIAGNOSIS — I77810 Thoracic aortic ectasia: Secondary | ICD-10-CM | POA: Diagnosis not present

## 2024-04-25 DIAGNOSIS — S2243XA Multiple fractures of ribs, bilateral, initial encounter for closed fracture: Secondary | ICD-10-CM | POA: Diagnosis not present

## 2024-04-25 DIAGNOSIS — Z823 Family history of stroke: Secondary | ICD-10-CM | POA: Diagnosis not present

## 2024-04-25 DIAGNOSIS — I7121 Aneurysm of the ascending aorta, without rupture: Secondary | ICD-10-CM | POA: Diagnosis not present

## 2024-04-25 DIAGNOSIS — E871 Hypo-osmolality and hyponatremia: Secondary | ICD-10-CM | POA: Diagnosis not present

## 2024-04-25 DIAGNOSIS — Y9301 Activity, walking, marching and hiking: Secondary | ICD-10-CM | POA: Diagnosis present

## 2024-04-25 DIAGNOSIS — G9389 Other specified disorders of brain: Secondary | ICD-10-CM | POA: Diagnosis not present

## 2024-04-25 DIAGNOSIS — S2249XD Multiple fractures of ribs, unspecified side, subsequent encounter for fracture with routine healing: Principal | ICD-10-CM

## 2024-04-25 DIAGNOSIS — Z888 Allergy status to other drugs, medicaments and biological substances status: Secondary | ICD-10-CM | POA: Diagnosis not present

## 2024-04-25 DIAGNOSIS — N83291 Other ovarian cyst, right side: Secondary | ICD-10-CM | POA: Diagnosis not present

## 2024-04-25 DIAGNOSIS — S0990XA Unspecified injury of head, initial encounter: Secondary | ICD-10-CM | POA: Diagnosis not present

## 2024-04-25 DIAGNOSIS — K219 Gastro-esophageal reflux disease without esophagitis: Secondary | ICD-10-CM | POA: Diagnosis not present

## 2024-04-25 DIAGNOSIS — I6782 Cerebral ischemia: Secondary | ICD-10-CM | POA: Diagnosis not present

## 2024-04-25 DIAGNOSIS — K449 Diaphragmatic hernia without obstruction or gangrene: Secondary | ICD-10-CM | POA: Diagnosis not present

## 2024-04-25 DIAGNOSIS — Z79899 Other long term (current) drug therapy: Secondary | ICD-10-CM

## 2024-04-25 DIAGNOSIS — S2249XA Multiple fractures of ribs, unspecified side, initial encounter for closed fracture: Secondary | ICD-10-CM | POA: Diagnosis present

## 2024-04-25 LAB — CBC WITH DIFFERENTIAL/PLATELET
Abs Immature Granulocytes: 0.04 K/uL (ref 0.00–0.07)
Basophils Absolute: 0.1 K/uL (ref 0.0–0.1)
Basophils Relative: 2 %
Eosinophils Absolute: 0.3 K/uL (ref 0.0–0.5)
Eosinophils Relative: 4 %
HCT: 38.1 % (ref 36.0–46.0)
Hemoglobin: 13 g/dL (ref 12.0–15.0)
Immature Granulocytes: 1 %
Lymphocytes Relative: 21 %
Lymphs Abs: 1.6 K/uL (ref 0.7–4.0)
MCH: 33 pg (ref 26.0–34.0)
MCHC: 34.1 g/dL (ref 30.0–36.0)
MCV: 96.7 fL (ref 80.0–100.0)
Monocytes Absolute: 0.9 K/uL (ref 0.1–1.0)
Monocytes Relative: 11 %
Neutro Abs: 4.6 K/uL (ref 1.7–7.7)
Neutrophils Relative %: 61 %
Platelets: 331 K/uL (ref 150–400)
RBC: 3.94 MIL/uL (ref 3.87–5.11)
RDW: 11.7 % (ref 11.5–15.5)
WBC: 7.5 K/uL (ref 4.0–10.5)
nRBC: 0 % (ref 0.0–0.2)

## 2024-04-25 LAB — COMPREHENSIVE METABOLIC PANEL WITH GFR
ALT: 13 U/L (ref 0–44)
AST: 31 U/L (ref 15–41)
Albumin: 4.5 g/dL (ref 3.5–5.0)
Alkaline Phosphatase: 64 U/L (ref 38–126)
Anion gap: 14 (ref 5–15)
BUN: 11 mg/dL (ref 8–23)
CO2: 24 mmol/L (ref 22–32)
Calcium: 9.4 mg/dL (ref 8.9–10.3)
Chloride: 95 mmol/L — ABNORMAL LOW (ref 98–111)
Creatinine, Ser: 0.87 mg/dL (ref 0.44–1.00)
GFR, Estimated: >60 mL/min (ref >60)
Glucose, Bld: 111 mg/dL — ABNORMAL HIGH (ref 70–99)
Potassium: 4 mmol/L (ref 3.5–5.1)
Sodium: 133 mmol/L — ABNORMAL LOW (ref 135–145)
Total Bilirubin: 0.5 mg/dL (ref 0.0–1.2)
Total Protein: 6.5 g/dL (ref 6.5–8.1)

## 2024-04-25 MED ORDER — MORPHINE SULFATE (PF) 4 MG/ML IV SOLN
4.0000 mg | Freq: Once | INTRAVENOUS | Status: AC
  Administered 2024-04-25: 4 mg via INTRAVENOUS
  Filled 2024-04-25: qty 1

## 2024-04-25 MED ORDER — LIDOCAINE 5 % EX PTCH
1.0000 | MEDICATED_PATCH | CUTANEOUS | Status: DC
  Administered 2024-04-25: 1 via TRANSDERMAL
  Filled 2024-04-25 (×3): qty 1

## 2024-04-25 MED ORDER — METHOCARBAMOL 500 MG PO TABS
500.0000 mg | ORAL_TABLET | Freq: Once | ORAL | Status: AC
  Administered 2024-04-25: 500 mg via ORAL
  Filled 2024-04-25: qty 1

## 2024-04-25 MED ORDER — OXYCODONE-ACETAMINOPHEN 5-325 MG PO TABS
1.0000 | ORAL_TABLET | Freq: Once | ORAL | Status: AC
  Administered 2024-04-25: 1 via ORAL
  Filled 2024-04-25: qty 1

## 2024-04-25 MED ORDER — ACETAMINOPHEN 325 MG PO TABS
650.0000 mg | ORAL_TABLET | Freq: Once | ORAL | Status: AC
  Administered 2024-04-25: 650 mg via ORAL
  Filled 2024-04-25: qty 2

## 2024-04-25 MED ORDER — MORPHINE SULFATE (PF) 2 MG/ML IV SOLN
2.0000 mg | Freq: Once | INTRAVENOUS | Status: AC
  Administered 2024-04-25: 2 mg via INTRAVENOUS
  Filled 2024-04-25: qty 1

## 2024-04-25 MED ORDER — LACTATED RINGERS IV BOLUS
500.0000 mL | Freq: Once | INTRAVENOUS | Status: AC
  Administered 2024-04-25: 500 mL via INTRAVENOUS

## 2024-04-25 NOTE — ED Triage Notes (Signed)
 Pt reports mechanical fall today, c/o R lower back pain, R rib pain.   Reports hx of falls.

## 2024-04-25 NOTE — ED Provider Notes (Incomplete)
  EMERGENCY DEPARTMENT AT [facility] Provider Note   CSN: [id number] Arrival date & time: 04/25/24  8096     Patient presents with: Catherine Hubbard Catherine Hubbard is a 63 y.o. female.  {Add pertinent medical, surgical, social history, OB history to [id number]}  Fall       Prior to Admission medications   Medication Sig Start Date End Date Taking? Authorizing Provider  acetaminophen  (TYLENOL ) 500 MG tablet Take 2 tablets (1,000 mg total) by mouth every 6 (six) hours as needed for mild pain (pain score 1-3). 06/20/23   Meuth, Brooke A, PA-C  albuterol  (PROVENTIL ) (2.5 MG/3ML) 0.083% nebulizer solution Take 3 mLs (2.5 mg total) by nebulization every 6 (six) hours as needed for wheezing or shortness of breath. 07/30/23   Saguier, [place], PA-C  albuterol  (VENTOLIN  HFA) 108 (90 Base) MCG/ACT inhaler TAKE 2 PUFFS BY MOUTH EVERY 6 HOURS AS NEEDED FOR WHEEZE OR SHORTNESS OF BREATH 02/28/24   Saguier, [place], PA-C  Ascorbic Acid (VITAMIN C PO) Take 1 tablet by mouth daily. 1 tablet daily    [provider]  B Complex Vitamins (VITAMIN B COMPLEX PO) Take 1 tablet by mouth daily. 1 daily    [provider]  baclofen  (LIORESAL ) 10 MG tablet TAKE 1 TABLET BY MOUTH THREE TIMES A DAY AS NEEDED FOR MUSCLE SPASM 04/21/24   Sater, Charlie LABOR, MD  benzonatate  (TESSALON ) 100 MG capsule Take 1 capsule (100 mg total) by mouth 3 (three) times daily as needed for cough. 07/05/23   Saguier, [place], PA-C  bismuth  subsalicylate (PEPTO BISMOL) 262 MG/15ML suspension Take 30 mLs by mouth daily as needed for indigestion or diarrhea or loose stools (acid reflux).    [provider]  budesonide -formoterol  (SYMBICORT ) 160-4.5 MCG/ACT inhaler Inhale 2 puffs into the lungs 2 (two) times daily. 08/09/23   Saguier, [place], PA-C  busPIRone  (BUSPAR ) 7.5 MG tablet TAKE 2 TABLETS (15 MG TOTAL) BY MOUTH 2 (TWO) TIMES DAILY. 04/09/24   Saguier, [place], PA-C  Calcium  Carbonate Antacid  (TUMS PO) Take 2 tablets by mouth 2 (two) times daily as needed (acid reflux).    [provider]  cephALEXin  (KEFLEX ) 500 MG capsule Take 1 capsule (500 mg total) by mouth 2 (two) times daily. 02/26/24   Saguier, [place], PA-C  Cholecalciferol (D2000 ULTRA STRENGTH) 50 MCG (2000 UT) CAPS Take 2,000 Units by mouth daily.    [provider]  diclofenac  Sodium (VOLTAREN ) 1 % GEL Apply 4 g topically 4 (four) times daily. Patient taking differently: Apply 4 g topically 4 (four) times daily as needed (joint pain). 06/21/22   Raulkar, Sven SQUIBB, MD  fluconazole  (DIFLUCAN ) 150 MG tablet Take 1 tablet (150 mg total) by mouth daily. 08/09/23   Saguier, [place], PA-C  fluticasone  (FLONASE ) 50 MCG/ACT nasal spray SPRAY 2 SPRAYS INTO EACH NOSTRIL EVERY DAY 01/17/24   Saguier, [place], PA-C  gabapentin  (NEURONTIN ) 100 MG capsule TAKE 1 CAPSULE BY MOUTH THREE TIMES A DAY 02/10/24   Saguier, [place], PA-C  HYDROcodone -acetaminophen  (NORCO) 5-325 MG tablet 1 tab po q 6 hours prn severe pain 02/26/24   Saguier, [place], PA-C  Iron , Ferrous Sulfate , 325 (65 Fe) MG TABS 1 tab po bid Patient taking differently: Take 1 tablet by mouth in the morning and at bedtime. 1 tab po bid 11/09/22   Saguier, [place], PA-C  modafinil  (PROVIGIL ) 200 MG tablet TAKE 1 TABLET BY MOUTH IN THE MORNING AND 1/2 TABLET IN THE EVENING. CALL [phone] TO SCHEDULE  FOLLOW UP AROUND 02/10/24 FOR ONGOING REFILLS. 03/18/24   Sater, Charlie LABOR, MD  Multiple Vitamin (MULTIVITAMIN WITH MINERALS) TABS tablet Take 1 tablet by mouth daily.    [provider]  nystatin  cream (MYCOSTATIN ) Apply 1 Application topically 2 (two) times daily. 12/31/23   Saguier, [place], PA-C  ocrelizumab  (OCREVUS ) 300 MG/10ML injection INFUSE 600MG  INTRAVENOUSLY EVERY 6 MONTHS 04/21/24   Sater, Charlie LABOR, MD  olmesartan  (BENICAR ) 5 MG tablet TAKE 2 TABLETS BY MOUTH EVERY DAY 11/18/23   Saguier, [place], PA-C  ondansetron  (ZOFRAN ) 4 MG tablet Take 1 tablet (4 mg total)  by mouth every 6 (six) hours. 07/10/23   Ula Prentice SAUNDERS, MD  oxybutynin  (DITROPAN ) 5 MG tablet TAKE 1 TABLET BY MOUTH TWICE A DAY 03/06/24   Sater, Charlie LABOR, MD  pantoprazole  (PROTONIX ) 40 MG tablet Take 1 tablet (40 mg total) by mouth daily. 07/10/23   Ula Prentice SAUNDERS, MD  pantoprazole  (PROTONIX ) 40 MG tablet TAKE 1 TABLET BY MOUTH EVERY DAY 02/19/24   Saguier, [place], PA-C  potassium chloride  SA (KLOR-CON  M) 20 MEQ tablet Take 1 tablet (20 mEq total) by mouth daily. 06/25/23   Krasowski, Robert J, MD  Probiotic Product (PROBIOTIC DAILY PO) Take 1 tablet by mouth 3 (three) times a week. 1 daily    [provider]    Allergies: Ibuprofen  and Nsaids    Review of Systems  Updated Vital Signs BP (!) 148/72 (BP Location: Right Arm)   Pulse (!) 55   Temp 98.1 F (36.7 C) (Oral)   Resp 20   Ht 5' 3 (1.6 m)   Wt 71.2 kg   LMP 08/13/2014 Comment: very few periods  SpO2 96%   BMI 27.81 kg/m   Physical Exam  (all labs ordered are listed, but only abnormal results are displayed) Labs Reviewed  COMPREHENSIVE METABOLIC PANEL WITH GFR - Abnormal; Notable for the following components:      Result Value   Sodium 133 (*)    Chloride 95 (*)    Glucose, Bld 111 (*)    All other components within normal limits  CBC WITH DIFFERENTIAL/PLATELET    EKG: None  Radiology: No results found.  {Document cardiac monitor, telemetry assessment procedure when appropriate:32947} Procedures   Medications Ordered in the ED  lidocaine  (LIDODERM ) 5 % 1 patch (1 patch Transdermal Patch Applied 04/25/24 1945)  oxyCODONE -acetaminophen  (PERCOCET/ROXICET) 5-325 MG per tablet 1 tablet (1 tablet Oral Given 04/25/24 1944)    Clinical Course as of 04/25/24 2035  Sat Apr 25, 2024  2024 CT Head Wo Contrast MPRESSION: 1. No acute intracranial process. Chronic ischemic changes as above.   Electronically Signed   By: Ozell Daring M.D.   On: 04/25/2024 20:17   [TY]    Clinical Course User Index [TY]  Neysa Caron PARAS, DO   {Click here for ABCD2, HEART and other calculators REFRESH Note before signing:1}                              Medical Decision Making Amount and/or Complexity of Data Reviewed Labs: ordered. Radiology: ordered. ECG/medicine tests: ordered.  Risk Prescription drug management.   ***  {Document critical care time when appropriate  Document review of labs and clinical decision tools ie CHADS2VASC2, etc  Document your independent review of radiology images and any outside records  Document your discussion with family members, caretakers and with consultants  Document social determinants of health affecting  pt's care  Document your decision making why or why not admission, treatments were needed:32947:::1}   Final diagnoses:  None    ED Discharge Orders     None

## 2024-04-25 NOTE — ED Provider Notes (Signed)
 [facility] Provider Note   CSN: [id number] Arrival date & time: 04/25/24  8096     Patient presents with: Catherine Hubbard Catherine Hubbard is a 63 y.o. female.   This is a 63 year old female presenting emergency department with right rib pain and right low back pain after mechanical fall.  States she was walking, lost balance and tripped over her foot.  Denies hitting her head no LOC.  Not on blood thinners.  Has a history of falls secondary to her MS and ambulatory dysfunction.  Walks with a cane at baseline.  Notes feels similar to prior rib fractures that she has had in the past.  No numbness tingling changes in sensation.  No chest pain or shortness of breath.  No abdominal pain.  No headache.   Fall       Prior to Admission medications   Medication Sig Start Date End Date Taking? Authorizing Provider  acetaminophen  (TYLENOL ) 500 MG tablet Take 2 tablets (1,000 mg total) by mouth every 6 (six) hours as needed for mild pain (pain score 1-3). 06/20/23   Meuth, Brooke A, PA-C  albuterol  (PROVENTIL ) (2.5 MG/3ML) 0.083% nebulizer solution Take 3 mLs (2.5 mg total) by nebulization every 6 (six) hours as needed for wheezing or shortness of breath. 07/30/23   Saguier, [place], PA-C  albuterol  (VENTOLIN  HFA) 108 (90 Base) MCG/ACT inhaler TAKE 2 PUFFS BY MOUTH EVERY 6 HOURS AS NEEDED FOR WHEEZE OR SHORTNESS OF BREATH 02/28/24   Saguier, [place], PA-C  Ascorbic Acid (VITAMIN C PO) Take 1 tablet by mouth daily. 1 tablet daily    [provider]  B Complex Vitamins (VITAMIN B COMPLEX PO) Take 1 tablet by mouth daily. 1 daily    [provider]  baclofen  (LIORESAL ) 10 MG tablet TAKE 1 TABLET BY MOUTH THREE TIMES A DAY AS NEEDED FOR MUSCLE SPASM 04/21/24   Sater, Charlie LABOR, MD  benzonatate  (TESSALON ) 100 MG capsule Take 1 capsule (100 mg total) by mouth 3 (three) times daily as needed for cough. 07/05/23   Saguier, [place], PA-C  bismuth   subsalicylate (PEPTO BISMOL) 262 MG/15ML suspension Take 30 mLs by mouth daily as needed for indigestion or diarrhea or loose stools (acid reflux).    [provider]  budesonide -formoterol  (SYMBICORT ) 160-4.5 MCG/ACT inhaler Inhale 2 puffs into the lungs 2 (two) times daily. 08/09/23   Saguier, [place], PA-C  busPIRone  (BUSPAR ) 7.5 MG tablet TAKE 2 TABLETS (15 MG TOTAL) BY MOUTH 2 (TWO) TIMES DAILY. 04/09/24   Saguier, [place], PA-C  Calcium  Carbonate Antacid (TUMS PO) Take 2 tablets by mouth 2 (two) times daily as needed (acid reflux).    [provider]  cephALEXin  (KEFLEX ) 500 MG capsule Take 1 capsule (500 mg total) by mouth 2 (two) times daily. 02/26/24   Saguier, [place], PA-C  Cholecalciferol (D2000 ULTRA STRENGTH) 50 MCG (2000 UT) CAPS Take 2,000 Units by mouth daily.    [provider]  diclofenac  Sodium (VOLTAREN ) 1 % GEL Apply 4 g topically 4 (four) times daily. Patient taking differently: Apply 4 g topically 4 (four) times daily as needed (joint pain). 06/21/22   Raulkar, Sven SQUIBB, MD  fluconazole  (DIFLUCAN ) 150 MG tablet Take 1 tablet (150 mg total) by mouth daily. 08/09/23   Saguier, [place], PA-C  fluticasone  (FLONASE ) 50 MCG/ACT nasal spray SPRAY 2 SPRAYS INTO EACH NOSTRIL EVERY DAY 01/17/24   Saguier, Dallas, PA-C  gabapentin  (NEURONTIN ) 100 MG capsule TAKE 1 CAPSULE BY  MOUTH THREE TIMES A DAY 02/10/24   Saguier, Dallas, PA-C  HYDROcodone -acetaminophen  (NORCO) 5-325 MG tablet 1 tab po q 6 hours prn severe pain 02/26/24   Saguier, [place], PA-C  Iron , Ferrous Sulfate , 325 (65 Fe) MG TABS 1 tab po bid Patient taking differently: Take 1 tablet by mouth in the morning and at bedtime. 1 tab po bid 11/09/22   Saguier, Dallas, PA-C  modafinil  (PROVIGIL ) 200 MG tablet TAKE 1 TABLET BY MOUTH IN THE MORNING AND 1/2 TABLET IN THE EVENING. CALL [phone] TO SCHEDULE FOLLOW UP AROUND 02/10/24 FOR ONGOING REFILLS. 03/18/24   Sater, Charlie LABOR, MD  Multiple Vitamin (MULTIVITAMIN WITH  MINERALS) TABS tablet Take 1 tablet by mouth daily.    [provider]  nystatin  cream (MYCOSTATIN ) Apply 1 Application topically 2 (two) times daily. 12/31/23   Saguier, [place], PA-C  ocrelizumab  (OCREVUS ) 300 MG/10ML injection INFUSE 600MG  INTRAVENOUSLY EVERY 6 MONTHS 04/21/24   Sater, Charlie LABOR, MD  olmesartan  (BENICAR ) 5 MG tablet TAKE 2 TABLETS BY MOUTH EVERY DAY 11/18/23   Saguier, Dallas, PA-C  ondansetron  (ZOFRAN ) 4 MG tablet Take 1 tablet (4 mg total) by mouth every 6 (six) hours. 07/10/23   Ula Prentice SAUNDERS, MD  oxybutynin  (DITROPAN ) 5 MG tablet TAKE 1 TABLET BY MOUTH TWICE A DAY 03/06/24   Sater, Charlie LABOR, MD  pantoprazole  (PROTONIX ) 40 MG tablet Take 1 tablet (40 mg total) by mouth daily. 07/10/23   Ula Prentice SAUNDERS, MD  pantoprazole  (PROTONIX ) 40 MG tablet TAKE 1 TABLET BY MOUTH EVERY DAY 02/19/24   Saguier, Dallas, PA-C  potassium chloride  SA (KLOR-CON  M) 20 MEQ tablet Take 1 tablet (20 mEq total) by mouth daily. 06/25/23   Krasowski, Robert J, MD  Probiotic Product (PROBIOTIC DAILY PO) Take 1 tablet by mouth 3 (three) times a week. 1 daily    [provider]    Allergies: Ibuprofen  and Nsaids    Review of Systems  Updated Vital Signs BP 115/74   Pulse (!) 54   Temp 98.1 F (36.7 C) (Oral)   Resp 20   Ht 5' 3 (1.6 m)   Wt 71.2 kg   LMP 08/13/2014 Comment: very few periods  SpO2 100%   BMI 27.81 kg/m   Physical Exam Vitals and nursing note reviewed.  Constitutional:      General: She is not in acute distress.    Appearance: She is not toxic-appearing.  HENT:     Head: Normocephalic and atraumatic.     Nose: Nose normal.  Eyes:     Conjunctiva/sclera: Conjunctivae normal.  Cardiovascular:     Rate and Rhythm: Normal rate and regular rhythm.  Pulmonary:     Effort: Pulmonary effort is normal.     Breath sounds: Normal breath sounds.  Abdominal:     General: There is no distension.     Palpations: Abdomen is soft.     Tenderness: There is no abdominal  tenderness. There is no guarding or rebound.  Musculoskeletal:     Comments: Tenderness to her right lower chest wall.  Stable.  No midline spinal tenderness.  Does have some diffuse tenderness to her right lumbar musculature.  No bony tenderness to extremities.  Equal pulses.  Skin:    Capillary Refill: Capillary refill takes less than 2 seconds.  Neurological:     General: No focal deficit present.     Mental Status: She is alert and oriented to person, place, and time.  Psychiatric:  Mood and Affect: Mood normal.        Behavior: Behavior normal.     (all labs ordered are listed, but only abnormal results are displayed) Labs Reviewed  COMPREHENSIVE METABOLIC PANEL WITH GFR - Abnormal; Notable for the following components:      Result Value   Sodium 133 (*)    Chloride 95 (*)    Glucose, Bld 111 (*)    All other components within normal limits  CBC WITH DIFFERENTIAL/PLATELET    EKG: None  Radiology: CT Lumbar Spine Wo Contrast Result Date: 04/25/2024 EXAM: CT OF THE LUMBAR SPINE WITHOUT CONTRAST 04/25/2024 08:34:00 PM TECHNIQUE: CT of the lumbar spine was performed without the administration of intravenous contrast. Multiplanar reformatted images are provided for review. Automated exposure control, iterative reconstruction, and/or weight based adjustment of the mA/kV was utilized to reduce the radiation dose to as low as reasonably achievable. COMPARISON: CT abdomen and pelvis dated 10/09/2022. Normal. CLINICAL HISTORY: Back trauma, no prior imaging (Age >= 16y). Pt reports mechanical fall today, c/o R lower back pain, R rib pain. Reports hx of falls. FINDINGS: BONES AND ALIGNMENT: No evidence of acute fracture or traumatic lithesis in the lumbar spine. Acute mildly displaced fracture of the posterior right 11th rib . Unchanged mild grade 1 retrolisthesis of L1, L2, and L3. Chronic superior endplate compression fracture of L1 with 5 mm of retropulsion of the superior endplate  causing mild spinal canal narrowing. DEGENERATIVE CHANGES: Posterior disc bulge at L3-L4 causes moderate-to-severe spinal canal narrowing. There is moderate neural foraminal narrowing on the right at L1-L2 and on the left at L5-S1. SOFT TISSUES: No acute abnormality. IMPRESSION: 1. Acute mildly displaced fracture of the posterior right 11th rib . 2. Unchanged compression fracture of L1 with 5 mm of retropulsion causing mild spinal canal narrowing. 3. Posterior disc bulge at L3-L4 causes moderate-to-severe spinal canal narrowing. Electronically signed by: Norman Gatlin MD 04/25/2024 08:55 PM EDT RP Workstation: [id number]   CT PELVIS WO CONTRAST Result Date: 04/25/2024 CLINICAL DATA:  Multiple falls, hip fracture suspected EXAM: CT PELVIS WITHOUT CONTRAST TECHNIQUE: Multidetector CT imaging of the pelvis was performed following the standard protocol without intravenous contrast. RADIATION DOSE REDUCTION: This exam was performed according to the departmental dose-optimization program which includes automated exposure control, adjustment of the mA and/or kV according to patient size and/or use of iterative reconstruction technique. COMPARISON:  07/10/2023 FINDINGS: Urinary Tract: Punctate focus of gas in the bladder lumen may reflect recent catheterization. Distal ureters are unremarkable. Bowel: No bowel obstruction or ileus. Extensive sigmoid diverticulosis without evidence of acute diverticulitis. Normal appendix is partially visualized within the right lower quadrant. No bowel wall thickening or inflammatory change. Vascular/Lymphatic: No significant vascular findings on this unenhanced exam. No pathologic adenopathy. Reproductive: Bilateral simple appearing ovarian cysts are identified, measuring 6.7 x 6.0 cm on the right and 2.9 x 2.3 cm on the left, not appreciably changed since prior study. Uterus is unremarkable. Other: No free fluid or free intraperitoneal gas. Small fat containing supraumbilical ventral  hernia. Musculoskeletal: There are no acute displaced fractures. Prior healed left superior and inferior pubic rami fractures are again noted. Mild symmetrical bilateral hip osteoarthritis. Reconstructed images demonstrate no additional findings. IMPRESSION: 1. No acute pelvic fracture. 2. Sigmoid diverticulosis without diverticulitis. 3. Stable simple appearing bilateral ovarian cysts as above. Based on previous recommendation, follow-up pelvic ultrasound is recommended November 2025 to document 1 year stability. 4. Punctate focus of gas within the bladder lumen likely due to recent  instrumentation. Electronically Signed   By: Ozell Daring M.D.   On: 04/25/2024 20:54   DG Chest Portable 1 View Result Date: 04/25/2024 CLINICAL DATA:  Tripped and fell, mid back pain EXAM: PORTABLE CHEST 1 VIEW COMPARISON:  01/22/2024 FINDINGS: Single frontal view of the chest demonstrates a stable cardiac silhouette. Continued ectasia of the thoracic aorta. No airspace disease, effusion, or pneumothorax. Multiple healed bilateral rib fractures are noted. The acute displaced right posterior ninth, tenth, and eleventh rib fracture seen on CT are more difficult to appreciate by x-ray. IMPRESSION: 1. No acute intrathoracic process. Please see corresponding CT chest exam describing right ninth through eleventh rib fractures which are difficult to visualized by x-ray. Electronically Signed   By: Ozell Daring M.D.   On: 04/25/2024 20:50   CT Chest Wo Contrast Result Date: 04/25/2024 CLINICAL DATA:  Clemens today, right lower back and right rib pain EXAM: CT CHEST WITHOUT CONTRAST TECHNIQUE: Multidetector CT imaging of the chest was performed following the standard protocol without IV contrast. RADIATION DOSE REDUCTION: This exam was performed according to the departmental dose-optimization program which includes automated exposure control, adjustment of the mA and/or kV according to patient size and/or use of iterative reconstruction  technique. COMPARISON:  06/19/2023 FINDINGS: Cardiovascular: Unenhanced imaging of the heart is unremarkable without pericardial effusion. Dilation of the ascending thoracic aorta measuring 4 cm. Assessment of the vascular lumen cannot be performed without intravenous contrast. Stable atherosclerosis of the aorta and coronary vasculature. Mediastinum/Nodes: No enlarged mediastinal or axillary lymph nodes. Thyroid  gland, trachea, and esophagus demonstrate no significant findings. Stable hiatal hernia. Lungs/Pleura: No acute airspace disease, effusion, or pneumothorax. Central airways are patent. Upper Abdomen: No acute abnormality. Musculoskeletal: There are minimally displaced acute fractures involving the right posterior ninth, tenth, and eleventh ribs. Numerous other prior healed bilateral rib fractures are also noted. Chronic T8 compression deformity unchanged. Reconstructed images demonstrate no additional findings. IMPRESSION: 1. Acute minimally displaced right posterior ninth, tenth, and eleventh rib fractures. 2. Stable hiatal hernia. 3. 4 cm ascending thoracic aortic aneurysm. Assessment of the vascular lumen is limited without IV contrast. Recommend annual imaging followup by CTA or MRA. This recommendation follows 2010 ACCF/AHA/AATS/ACR/ASA/SCA/SCAI/SIR/STS/SVM Guidelines for the Diagnosis and Management of Patients with Thoracic Aortic Disease. Circulation. 2010; 121: Z733-z630. Aortic aneurysm NOS (ICD10-I71.9) Electronically Signed   By: Ozell Daring M.D.   On: 04/25/2024 20:47   CT Head Wo Contrast Result Date: 04/25/2024 CLINICAL DATA:  Head trauma, coagulopathy EXAM: CT HEAD WITHOUT CONTRAST TECHNIQUE: Contiguous axial images were obtained from the base of the skull through the vertex without intravenous contrast. RADIATION DOSE REDUCTION: This exam was performed according to the departmental dose-optimization program which includes automated exposure control, adjustment of the mA and/or kV  according to patient size and/or use of iterative reconstruction technique. COMPARISON:  08/03/2023 FINDINGS: Brain: Stable hypodensities throughout the periventricular white matter compatible with chronic small vessel ischemic changes. 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 No signs of acute infarct or hemorrhage. The lateral ventricles and remaining midline structures are unremarkable. No acute extra-axial fluid collections. No mass effect. Vascular: No hyperdense vessel or unexpected calcification. Skull: Normal. Negative for fracture or focal lesion. Sinuses/Orbits: Moderate mucoperiosteal thickening within the left maxillary sinus. Remaining paranasal sinuses are clear. Other: None. IMPRESSION: 1. No acute intracranial process. Chronic ischemic changes as above. Electronically Signed   By: Ozell Daring M.D.   On: 04/25/2024 20:17     Procedures  Medications Ordered in the ED  lidocaine  (LIDODERM ) 5 % 1 patch (1 patch Transdermal Patch Applied 04/25/24 1945)  morphine  (PF) 2 MG/ML injection 2 mg (has no administration in time range)  lactated ringers  bolus 500 mL (has no administration in time range)  oxyCODONE -acetaminophen  (PERCOCET/ROXICET) 5-325 MG per tablet 1 tablet (1 tablet Oral Given 04/25/24 1944)  methocarbamol  (ROBAXIN ) tablet 500 mg (500 mg Oral Given 04/25/24 2101)  acetaminophen  (TYLENOL ) tablet 650 mg (650 mg Oral Given 04/25/24 2101)    Clinical Course as of 04/25/24 2120  Sat Apr 25, 2024  2024 CT Head Wo Contrast MPRESSION: 1. No acute intracranial process. Chronic ischemic changes as above.   Electronically Signed   By: Ozell Daring M.D.   On: 04/25/2024 20:17   [TY]  2100 CT Chest Wo Contrast IMPRESSION: 1. Acute minimally displaced right posterior ninth, tenth, and eleventh rib fractures. 2. Stable hiatal hernia. 3. 4 cm ascending thoracic aortic aneurysm. Assessment of the vascular lumen is limited without  IV contrast. Recommend annual imaging followup by CTA or MRA. This recommendation follows 2010 ACCF/AHA/AATS/ACR/ASA/SCA/SCAI/SIR/STS/SVM Guidelines for the Diagnosis and Management of Patients with Thoracic Aortic Disease. Circulation. 2010; 121: [id number]. Aortic aneurysm NOS (ICD10-I71.9)   Electronically Signed   By: Ozell Daring M.D.   On: 04/25/2024 20:47   [TY]  2100 CT Lumbar Spine Wo Contrast IMPRESSION: 1. Acute mildly displaced fracture of the posterior right 11th rib . 2. Unchanged compression fracture of L1 with 5 mm of retropulsion causing mild spinal canal narrowing. 3. Posterior disc bulge at L3-L4 causes moderate-to-severe spinal canal narrowing.  Electronically signed by: Norman Gatlin MD 04/25/2024 08:55 PM EDT RP Workstation: [id number]   [TY]  2120 Patient continues to have severe pain despite p.o. medications.  With her CT scans showing multiple rib fractures with her comorbidities and severe pain we will admit for further pain control and pulmonary toilet.  Case discussed with trauma surgeon, Dr. Dann who agrees to admit patient to his service. [TY]    Clinical Course User Index [TY] Neysa Caron PARAS, DO                                 Medical Decision Making This is a 63 year old female presenting emergency department for evaluation after a fall for right sided chest pain.  She is afebrile, slightly bradycardic, hypertensive.  Maintaining oxygen saturation on room air.  Does appear to be uncomfortable.  Treated with lidocaine  patch and Percocet.  Per chart review does have history of prior rib fractures.  Also has complicated past medical history that includes MS, hypertension, asthma, alcohol abuse.  Will get basic labs and CT scans to evaluate for osseous trauma.  Low suspicion for intra-abdominal pathology based off of vital signs and physical exam.  She has no leukocytosis.  No anemia.  Minor hyponatremia, no AKI.  No transaminitis to suggest hepatic  injury.  See ED course for further MDM disposition.  Amount and/or Complexity of Data Reviewed Independent Historian:     Details: Significant other bedside notes that patient at baseline mentation External Data Reviewed:     Details: Not on a blood thinner Labs: ordered. Radiology: ordered and independent interpretation performed. Decision-making details documented in ED Course.    Details: Do not appreciate obvious pneumothorax on my independent review of chest x-ray ECG/medicine tests: ordered.  Risk OTC drugs. Prescription drug management. Decision regarding hospitalization. Diagnosis or treatment significantly  limited by social determinants of health. Risk Details: Poor health literacy      Final diagnoses:  None    ED Discharge Orders     None          Neysa Caron PARAS, DO 04/25/24 2120

## 2024-04-26 ENCOUNTER — Encounter (HOSPITAL_COMMUNITY): Payer: Self-pay

## 2024-04-26 DIAGNOSIS — H9190 Unspecified hearing loss, unspecified ear: Secondary | ICD-10-CM | POA: Diagnosis present

## 2024-04-26 DIAGNOSIS — E871 Hypo-osmolality and hyponatremia: Secondary | ICD-10-CM | POA: Diagnosis not present

## 2024-04-26 DIAGNOSIS — R3911 Hesitancy of micturition: Secondary | ICD-10-CM | POA: Diagnosis present

## 2024-04-26 DIAGNOSIS — S2249XA Multiple fractures of ribs, unspecified side, initial encounter for closed fracture: Secondary | ICD-10-CM | POA: Diagnosis not present

## 2024-04-26 DIAGNOSIS — W010XXA Fall on same level from slipping, tripping and stumbling without subsequent striking against object, initial encounter: Secondary | ICD-10-CM | POA: Diagnosis present

## 2024-04-26 DIAGNOSIS — Y9301 Activity, walking, marching and hiking: Secondary | ICD-10-CM | POA: Diagnosis present

## 2024-04-26 DIAGNOSIS — Z9049 Acquired absence of other specified parts of digestive tract: Secondary | ICD-10-CM | POA: Diagnosis not present

## 2024-04-26 DIAGNOSIS — Z7951 Long term (current) use of inhaled steroids: Secondary | ICD-10-CM | POA: Diagnosis not present

## 2024-04-26 DIAGNOSIS — R262 Difficulty in walking, not elsewhere classified: Secondary | ICD-10-CM | POA: Diagnosis present

## 2024-04-26 DIAGNOSIS — I712 Thoracic aortic aneurysm, without rupture, unspecified: Secondary | ICD-10-CM | POA: Diagnosis present

## 2024-04-26 DIAGNOSIS — R001 Bradycardia, unspecified: Secondary | ICD-10-CM | POA: Diagnosis present

## 2024-04-26 DIAGNOSIS — K219 Gastro-esophageal reflux disease without esophagitis: Secondary | ICD-10-CM | POA: Diagnosis present

## 2024-04-26 DIAGNOSIS — Z888 Allergy status to other drugs, medicaments and biological substances status: Secondary | ICD-10-CM | POA: Diagnosis not present

## 2024-04-26 DIAGNOSIS — I10 Essential (primary) hypertension: Secondary | ICD-10-CM | POA: Diagnosis present

## 2024-04-26 DIAGNOSIS — Z823 Family history of stroke: Secondary | ICD-10-CM | POA: Diagnosis not present

## 2024-04-26 DIAGNOSIS — Z9181 History of falling: Secondary | ICD-10-CM | POA: Diagnosis not present

## 2024-04-26 DIAGNOSIS — Z79899 Other long term (current) drug therapy: Secondary | ICD-10-CM | POA: Diagnosis not present

## 2024-04-26 DIAGNOSIS — R269 Unspecified abnormalities of gait and mobility: Secondary | ICD-10-CM | POA: Diagnosis not present

## 2024-04-26 DIAGNOSIS — Z9884 Bariatric surgery status: Secondary | ICD-10-CM | POA: Diagnosis not present

## 2024-04-26 DIAGNOSIS — S2241XA Multiple fractures of ribs, right side, initial encounter for closed fracture: Secondary | ICD-10-CM | POA: Diagnosis not present

## 2024-04-26 DIAGNOSIS — Y92009 Unspecified place in unspecified non-institutional (private) residence as the place of occurrence of the external cause: Secondary | ICD-10-CM | POA: Diagnosis not present

## 2024-04-26 DIAGNOSIS — G35 Multiple sclerosis: Secondary | ICD-10-CM | POA: Diagnosis not present

## 2024-04-26 LAB — BASIC METABOLIC PANEL WITH GFR
Anion gap: 10 (ref 5–15)
BUN: 9 mg/dL (ref 8–23)
CO2: 27 mmol/L (ref 22–32)
Calcium: 8.9 mg/dL (ref 8.9–10.3)
Chloride: 94 mmol/L — ABNORMAL LOW (ref 98–111)
Creatinine, Ser: 0.75 mg/dL (ref 0.44–1.00)
GFR, Estimated: >60 mL/min (ref >60)
Glucose, Bld: 85 mg/dL (ref 70–99)
Potassium: 3.7 mmol/L (ref 3.5–5.1)
Sodium: 131 mmol/L — ABNORMAL LOW (ref 135–145)

## 2024-04-26 LAB — CBC
HCT: 33.8 % — ABNORMAL LOW (ref 36.0–46.0)
Hemoglobin: 11.6 g/dL — ABNORMAL LOW (ref 12.0–15.0)
MCH: 33.2 pg (ref 26.0–34.0)
MCHC: 34.3 g/dL (ref 30.0–36.0)
MCV: 96.8 fL (ref 80.0–100.0)
Platelets: 275 K/uL (ref 150–400)
RBC: 3.49 MIL/uL — ABNORMAL LOW (ref 3.87–5.11)
RDW: 11.8 % (ref 11.5–15.5)
WBC: 8.4 K/uL (ref 4.0–10.5)
nRBC: 0 % (ref 0.0–0.2)

## 2024-04-26 MED ORDER — OXYCODONE HCL 5 MG PO TABS
5.0000 mg | ORAL_TABLET | ORAL | Status: DC | PRN
  Administered 2024-04-27: 5 mg via ORAL
  Administered 2024-04-27 – 2024-04-28 (×3): 10 mg via ORAL
  Filled 2024-04-26: qty 2
  Filled 2024-04-26: qty 1
  Filled 2024-04-26 (×3): qty 2

## 2024-04-26 MED ORDER — METOPROLOL TARTRATE 5 MG/5ML IV SOLN: 5.0000 mg | Freq: Four times a day (QID) | INTRAVENOUS | Status: DC | PRN

## 2024-04-26 MED ORDER — BISMUTH SUBSALICYLATE 262 MG/15ML PO SUSP
30.0000 mL | Freq: Every day | ORAL | Status: DC | PRN
  Administered 2024-04-26: 30 mL via ORAL
  Filled 2024-04-26: qty 236

## 2024-04-26 MED ORDER — ALBUTEROL SULFATE (2.5 MG/3ML) 0.083% IN NEBU
3.0000 mL | INHALATION_SOLUTION | Freq: Four times a day (QID) | RESPIRATORY_TRACT | Status: DC
  Administered 2024-04-26 (×2): 3 mL via RESPIRATORY_TRACT
  Filled 2024-04-26 (×2): qty 3

## 2024-04-26 MED ORDER — ACETAMINOPHEN 500 MG PO TABS: 1000.0000 mg | ORAL_TABLET | Freq: Four times a day (QID) | ORAL | Status: DC | PRN

## 2024-04-26 MED ORDER — BUSPIRONE HCL 5 MG PO TABS
15.0000 mg | ORAL_TABLET | Freq: Two times a day (BID) | ORAL | Status: DC
  Administered 2024-04-26 – 2024-04-28 (×5): 15 mg via ORAL
  Filled 2024-04-26 (×5): qty 3

## 2024-04-26 MED ORDER — DOCUSATE SODIUM 100 MG PO CAPS
100.0000 mg | ORAL_CAPSULE | Freq: Two times a day (BID) | ORAL | Status: DC
  Administered 2024-04-26 – 2024-04-27 (×3): 100 mg via ORAL
  Filled 2024-04-26 (×5): qty 1

## 2024-04-26 MED ORDER — CALCIUM CARBONATE ANTACID 500 MG PO CHEW
1.0000 | CHEWABLE_TABLET | Freq: Two times a day (BID) | ORAL | Status: DC | PRN
  Administered 2024-04-26 (×3): 200 mg via ORAL
  Filled 2024-04-26 (×3): qty 1

## 2024-04-26 MED ORDER — MODAFINIL 100 MG PO TABS: 200.0000 mg | ORAL_TABLET | Freq: Every day | ORAL | Status: DC

## 2024-04-26 MED ORDER — MODAFINIL 100 MG PO TABS
200.0000 mg | ORAL_TABLET | Freq: Every day | ORAL | Status: DC
  Administered 2024-04-26 – 2024-04-28 (×3): 200 mg via ORAL
  Filled 2024-04-26 (×3): qty 2

## 2024-04-26 MED ORDER — SODIUM CHLORIDE 1 G PO TABS
1.0000 g | ORAL_TABLET | Freq: Three times a day (TID) | ORAL | Status: DC
  Administered 2024-04-26 – 2024-04-27 (×3): 1 g via ORAL
  Filled 2024-04-26 (×3): qty 1

## 2024-04-26 MED ORDER — SODIUM CHLORIDE 0.9 % IV SOLN: 250.0000 mL | INTRAVENOUS | Status: AC | PRN

## 2024-04-26 MED ORDER — ACETAMINOPHEN 500 MG PO TABS
1000.0000 mg | ORAL_TABLET | Freq: Four times a day (QID) | ORAL | Status: DC
  Administered 2024-04-26 – 2024-04-28 (×10): 1000 mg via ORAL
  Filled 2024-04-26 (×9): qty 2

## 2024-04-26 MED ORDER — SODIUM CHLORIDE 0.9% FLUSH
3.0000 mL | Freq: Two times a day (BID) | INTRAVENOUS | Status: DC
  Administered 2024-04-26 – 2024-04-28 (×6): 3 mL via INTRAVENOUS

## 2024-04-26 MED ORDER — ENOXAPARIN SODIUM 30 MG/0.3ML IJ SOSY
30.0000 mg | PREFILLED_SYRINGE | Freq: Two times a day (BID) | INTRAMUSCULAR | Status: DC
  Administered 2024-04-27 – 2024-04-28 (×3): 30 mg via SUBCUTANEOUS
  Filled 2024-04-26 (×3): qty 0.3

## 2024-04-26 MED ORDER — CALCIUM CARBONATE ANTACID 500 MG PO CHEW
1.0000 | CHEWABLE_TABLET | Freq: Four times a day (QID) | ORAL | Status: DC | PRN
  Administered 2024-04-27: 200 mg via ORAL
  Filled 2024-04-26: qty 1

## 2024-04-26 MED ORDER — ALBUTEROL SULFATE (2.5 MG/3ML) 0.083% IN NEBU
3.0000 mL | INHALATION_SOLUTION | Freq: Three times a day (TID) | RESPIRATORY_TRACT | Status: DC
  Administered 2024-04-26 – 2024-04-27 (×3): 3 mL via RESPIRATORY_TRACT
  Filled 2024-04-26 (×3): qty 3

## 2024-04-26 MED ORDER — OXYCODONE HCL 5 MG PO TABS
10.0000 mg | ORAL_TABLET | ORAL | Status: DC | PRN
  Administered 2024-04-26: 10 mg via ORAL
  Filled 2024-04-26: qty 2

## 2024-04-26 MED ORDER — MORPHINE SULFATE (PF) 4 MG/ML IV SOLN: 4.0000 mg | INTRAVENOUS | Status: DC | PRN

## 2024-04-26 MED ORDER — OXYCODONE HCL 5 MG PO TABS: 5.0000 mg | ORAL_TABLET | ORAL | Status: DC | PRN

## 2024-04-26 MED ORDER — HYDRALAZINE HCL 20 MG/ML IJ SOLN: 10.0000 mg | INTRAMUSCULAR | Status: DC | PRN

## 2024-04-26 MED ORDER — ONDANSETRON 4 MG PO TBDP: 4.0000 mg | ORAL_TABLET | Freq: Four times a day (QID) | ORAL | Status: DC | PRN

## 2024-04-26 MED ORDER — POLYETHYLENE GLYCOL 3350 17 G PO PACK: 17.0000 g | PACK | Freq: Every day | ORAL | Status: DC | PRN

## 2024-04-26 MED ORDER — IRBESARTAN 150 MG PO TABS
75.0000 mg | ORAL_TABLET | Freq: Every day | ORAL | Status: DC
  Administered 2024-04-26 – 2024-04-28 (×3): 75 mg via ORAL
  Filled 2024-04-26 (×3): qty 1

## 2024-04-26 MED ORDER — ONDANSETRON HCL 4 MG/2ML IJ SOLN
4.0000 mg | Freq: Four times a day (QID) | INTRAMUSCULAR | Status: DC | PRN
Start: 2024-04-26 — End: 2024-04-28
  Administered 2024-04-26 (×2): 4 mg via INTRAVENOUS
  Filled 2024-04-26 (×2): qty 2

## 2024-04-26 MED ORDER — PANTOPRAZOLE SODIUM 40 MG PO TBEC
40.0000 mg | DELAYED_RELEASE_TABLET | Freq: Every day | ORAL | Status: DC
  Administered 2024-04-26 – 2024-04-28 (×3): 40 mg via ORAL
  Filled 2024-04-26 (×3): qty 1

## 2024-04-26 MED ORDER — ALBUTEROL SULFATE (2.5 MG/3ML) 0.083% IN NEBU: 2.5000 mg | INHALATION_SOLUTION | Freq: Four times a day (QID) | RESPIRATORY_TRACT | Status: DC | PRN

## 2024-04-26 MED ORDER — OXYBUTYNIN CHLORIDE 5 MG PO TABS
5.0000 mg | ORAL_TABLET | Freq: Two times a day (BID) | ORAL | Status: DC
  Administered 2024-04-26 – 2024-04-28 (×5): 5 mg via ORAL
  Filled 2024-04-26 (×5): qty 1

## 2024-04-26 MED ORDER — MODAFINIL 100 MG PO TABS
100.0000 mg | ORAL_TABLET | Freq: Every day | ORAL | Status: DC
  Filled 2024-04-26: qty 1

## 2024-04-26 MED ORDER — ONDANSETRON HCL 4 MG PO TABS: 4.0000 mg | ORAL_TABLET | Freq: Four times a day (QID) | ORAL | Status: DC

## 2024-04-26 MED ORDER — SODIUM CHLORIDE 0.9% FLUSH: 3.0000 mL | INTRAVENOUS | Status: DC | PRN

## 2024-04-26 MED ORDER — BACLOFEN 10 MG PO TABS
10.0000 mg | ORAL_TABLET | Freq: Two times a day (BID) | ORAL | Status: DC
  Administered 2024-04-26 (×3): 10 mg via ORAL
  Filled 2024-04-26 (×3): qty 1

## 2024-04-26 MED ORDER — GABAPENTIN 100 MG PO CAPS
100.0000 mg | ORAL_CAPSULE | Freq: Three times a day (TID) | ORAL | Status: DC
  Administered 2024-04-26 (×3): 100 mg via ORAL
  Filled 2024-04-26 (×3): qty 1

## 2024-04-26 NOTE — Plan of Care (Signed)

## 2024-04-26 NOTE — Evaluation (Signed)
 Occupational Therapy Evaluation Patient Details Name: Catherine Hubbard MRN: [id number] DOB: 10/03/1960 Today's Date: 04/26/2024   History of Present Illness   63 y/o F presenting to ED on 9/6 with fall on R side, found to have R rib fxs 9-11    PMH includes MS     Clinical Impressions Pt ind at baseline with ADLs, at times using [facility] for mobility, lives with spouse. Pt currently with minimal R side/rib pain, needing up to min A for ADLs, mod I for bed mobility and CGA for transfers with [facility]. Pt with good ROM in RUE despite rib pain. Pt presenting with impairments listed below, will follow acutely. Anticipate no OT follow up needs at d/c.      If plan is discharge home, recommend the following:   A little help with walking and/or transfers;A little help with bathing/dressing/bathroom;Assistance with cooking/housework;Assist for transportation;Help with stairs or ramp for entrance     Functional Status Assessment   Patient has had a recent decline in their functional status and demonstrates the ability to make significant improvements in function in a reasonable and predictable amount of time.     Equipment Recommendations   Tub/shower bench     Recommendations for Other Services         Precautions/Restrictions   Precautions Precautions: Fall Precaution/Restrictions Comments: Fall risk is present, but minimal Restrictions Weight Bearing Restrictions Per Provider Order: No     Mobility Bed Mobility Overal bed mobility: Modified Independent                  Transfers Overall transfer level: Needs assistance Equipment used: Straight cane Transfers: Sit to/from Stand Sit to Stand: Contact guard assist                  Balance Overall balance assessment: Mild deficits observed, not formally tested                                         ADL either performed or assessed with clinical judgement   ADL Overall ADL's : Needs  assistance/impaired Eating/Feeding: Set up   Grooming: Set up   Upper Body Bathing: Minimal assistance   Lower Body Bathing: Minimal assistance   Upper Body Dressing : Minimal assistance   Lower Body Dressing: Minimal assistance   Toilet Transfer: Supervision/safety   Toileting- Clothing Manipulation and Hygiene: Supervision/safety       Functional mobility during ADLs: Supervision/safety;Cane       Vision   Vision Assessment?: No apparent visual deficits     Perception Perception: Not tested       Praxis Praxis: Not tested       Pertinent Vitals/Pain Pain Assessment Pain Assessment: Faces Pain Score: 3  Faces Pain Scale: Hurts a little bit Pain Location: R ribs Pain Descriptors / Indicators: Grimacing Pain Intervention(s): Limited activity within patient's tolerance, Monitored during session, Repositioned     Extremity/Trunk Assessment Upper Extremity Assessment Upper Extremity Assessment: Generalized weakness   Lower Extremity Assessment Lower Extremity Assessment: Defer to PT evaluation   Cervical / Trunk Assessment Cervical / Trunk Assessment: Normal   Communication Communication Communication: Impaired Factors Affecting Communication: Hearing impaired (hearing aids)   Cognition Arousal: Alert Behavior During Therapy: WFL for tasks assessed/performed Cognition: No family/caregiver present to determine baseline  Following commands: Intact       Cueing  General Comments   Cueing Techniques: Verbal cues  vss   Exercises     Shoulder Instructions      Home Living Family/patient expects to be discharged to:: Private residence Living Arrangements: Spouse/significant other Available Help at Discharge: Family;Available 24 hours/day Type of Home: House Home Access: Stairs to enter [employer] of Steps: 1+1 Entrance Stairs-Rails: None Home Layout: One level     Bathroom Shower/Tub:  Chief Strategy Officer: Standard Bathroom Accessibility: Yes   Home Equipment: Agricultural consultant (2 wheels);Rollator (4 wheels);BSC/3in1;Cane - quad;Wheelchair - manual          Prior Functioning/Environment Prior Level of Function : Independent/Modified Independent             Mobility Comments: Ind no AD, will use RW or cane only if she has to ADLs Comments: ind    OT Problem List: Decreased strength;Decreased range of motion;Impaired balance (sitting and/or standing);Decreased activity tolerance;Decreased safety awareness   OT Treatment/Interventions: Self-care/ADL training;Therapeutic exercise;Energy conservation;DME and/or AE instruction;Therapeutic activities;Patient/family education;Balance training      OT Goals(Current goals can be found in the care plan section)   Acute Rehab OT Goals Patient Stated Goal: none stated OT Goal Formulation: With patient Time For Goal Achievement: 05/10/24 Potential to Achieve Goals: Good ADL Goals Pt Will Perform Upper Body Dressing: with modified independence;sitting Pt Will Perform Lower Body Dressing: with modified independence;sitting/lateral leans;sit to/from stand Pt Will Transfer to Toilet: with modified independence;regular height toilet;ambulating Pt Will Perform Tub/Shower Transfer: Tub transfer;Shower transfer;with modified independence;ambulating;tub bench   OT Frequency:  Min 2X/week    Co-evaluation              AM-PAC OT 6 Clicks Daily Activity     Outcome Measure Help from another person eating meals?: A Little Help from another person taking care of personal grooming?: A Little Help from another person toileting, which includes using toliet, bedpan, or urinal?: A Little Help from another person bathing (including washing, rinsing, drying)?: A Little Help from another person to put on and taking off regular upper body clothing?: A Little Help from another person to put on and taking off  regular lower body clothing?: A Little 6 Click Score: 18   End of Session Nurse Communication: Mobility status  Activity Tolerance: Patient tolerated treatment well Patient left: in bed;with call bell/phone within reach;with bed alarm set;with family/visitor present  OT Visit Diagnosis: Unsteadiness on feet (R26.81);Other abnormalities of gait and mobility (R26.89);Muscle weakness (generalized) (M62.81)                Time: 8389-8366 OT Time Calculation (min): 23 min Charges:  OT General Charges $OT Visit: 1 Visit OT Evaluation $OT Eval Low Complexity: 1 Low OT Treatments $Self Care/Home Management : 8-22 mins  Eliezer Khawaja K, OTD, OTR/L SecureChat [facility] Acute [facility] [phone]   Laneta POUR Koonce 04/26/2024, 4:58 PM

## 2024-04-26 NOTE — Evaluation (Signed)
 Physical Therapy Evaluation Patient Details Name: Catherine Hubbard MRN: [id number] DOB: 10-17-1960 Today's Date: 04/26/2024  History of Present Illness  63 yo female admitted after  was walking at home when she tripped and fell on to her R side, resulting in R sided rib fxs  Clinical Impression  Pt admitted with above diagnosis. Lives at home with spouse, in a single-level home with a few steps to enter; Prior to admission, pt was able to walk household distances, typically without an assistive device; Presents to PT with some pain R ribs effecting activity tolerance; Able to get up to EOB, stand with her cane with min assist, walk with cane in hallway with CGA;  Mobilizing well, and able to dc to home with prn husband assist; Can consider Outpt PT, especially for balance and falls prevention in the setting of MS; Pt currently with functional limitations due to the deficits listed below (see PT Problem List). Pt will benefit from skilled PT to increase their independence and safety with mobility to allow discharge to the venue listed below.           If plan is discharge home, recommend the following: Assistance with cooking/housework;Assist for transportation;Help with stairs or ramp for entrance   Can travel by private vehicle        Equipment Recommendations None recommended by PT  Recommendations for Other Services       Functional Status Assessment Patient has had a recent decline in their functional status and demonstrates the ability to make significant improvements in function in a reasonable and predictable amount of time.     Precautions / Restrictions Precautions Precautions: Fall Precaution/Restrictions Comments: Fall risk is present, but minimal Restrictions Weight Bearing Restrictions Per Provider Order: No      Mobility  Bed Mobility Overal bed mobility: Modified Independent                  Transfers Overall transfer level: Needs  assistance Equipment used: Straight cane Transfers: Sit to/from Stand Sit to Stand: Contact guard assist           General transfer comment: Stood from bed and standard height toilet; slow rise    Ambulation/Gait Ambulation/Gait assistance: Contact guard assist, Supervision Gait Distance (Feet): 60 Feet Assistive device: Straight cane Gait Pattern/deviations: Decreased step length - right, Decreased step length - left Gait velocity: slowed     General Gait Details: Overall steady with cane; slow steps at first; no overt loss of balance  Stairs            Wheelchair Mobility     Tilt Bed    Modified Rankin (Stroke Patients Only)       Balance Overall balance assessment: Mild deficits observed, not formally tested                                           Pertinent Vitals/Pain Pain Assessment Pain Assessment: Faces Faces Pain Scale: Hurts a little bit Pain Location: R ribs Pain Descriptors / Indicators: Grimacing Pain Intervention(s): Monitored during session    Home Living Family/patient expects to be discharged to:: Private residence Living Arrangements: Spouse/significant other Available Help at Discharge: Family;Available 24 hours/day Type of Home: House Home Access: Stairs to enter Entrance Stairs-Rails: None Entrance Stairs-Number of Steps: 1+1   Home Layout: One level Home Equipment: Agricultural consultant (2 wheels);Rollator (4 wheels);BSC/3in1;Cane - quad  Prior Function Prior Level of Function : Independent/Modified Independent             Mobility Comments: Ind no AD, will use RW or cane only if she has to       Extremity/Trunk Assessment   Upper Extremity Assessment Upper Extremity Assessment: Defer to OT evaluation (little difficulty noted with simple tasks)    Lower Extremity Assessment Lower Extremity Assessment: Generalized weakness       Communication   Communication Communication:  Impaired Factors Affecting Communication: Hearing impaired    Cognition Arousal: Alert Behavior During Therapy: WFL for tasks assessed/performed   PT - Cognitive impairments: No apparent impairments                         Following commands: Intact       Cueing Cueing Techniques: Verbal cues     General Comments General comments (skin integrity, edema, etc.): incr time to void while sitting on commode    Exercises     Assessment/Plan    PT Assessment Patient needs continued PT services  PT Problem List Decreased range of motion;Decreased strength;Decreased activity tolerance;Decreased balance;Decreased mobility;Pain;Decreased knowledge of use of DME;Cardiopulmonary status limiting activity       PT Treatment Interventions DME instruction;Gait training;Stair training;Functional mobility training;Therapeutic activities;Therapeutic exercise;Balance training;Patient/family education;Manual techniques    PT Goals (Current goals can be found in the Care Plan section)  Acute Rehab PT Goals Patient Stated Goal: Be able to manage at home PT Goal Formulation: With patient Time For Goal Achievement: 05/10/24 Potential to Achieve Goals: Good    Frequency Min 3X/week     Co-evaluation               AM-PAC PT 6 Clicks Mobility  Outcome Measure Help needed turning from your back to your side while in a flat bed without using bedrails?: None Help needed moving from lying on your back to sitting on the side of a flat bed without using bedrails?: None Help needed moving to and from a bed to a chair (including a wheelchair)?: A Little Help needed standing up from a chair using your arms (e.g., wheelchair or bedside chair)?: A Little Help needed to walk in hospital room?: A Little Help needed climbing 3-5 steps with a railing? : A Little 6 Click Score: 20    End of Session Equipment Utilized During Treatment: Gait belt (at axillae) Activity Tolerance: Patient  tolerated treatment well Patient left: in chair;with call bell/phone within reach Nurse Communication: Mobility status PT Visit Diagnosis: Other abnormalities of gait and mobility (R26.89);Pain Pain - Right/Left: Right Pain - part of body:  (Ribs)    Time: 8794-8754 PT Time Calculation (min) (ACUTE ONLY): 40 min   Charges:   PT Evaluation $PT Eval Low Complexity: 1 Low PT Treatments $Gait Training: 8-22 mins $Therapeutic Activity: 8-22 mins PT General Charges $$ ACUTE PT VISIT: 1 Visit         Silvano Currier, PT  Acute Rehabilitation Services Office [phone] Secure Chat welcomed   Silvano VEAR Currier 04/26/2024, 3:33 PM

## 2024-04-26 NOTE — H&P (Signed)
 Catherine Hubbard is an 63 y.o. female.   Chief Complaint: R rib pain HPI: 63yo F with PMHx MS and other issues as below was walking at home when she tripped and fell on to her R side. No LOC and she did not hit her head. She had a lot of pain in her ribs so [facility]. She was found to have 3 R rib FXs and was having a lot of pain. I accepted her for admission.   Past Medical History:  Diagnosis Date   Alcohol abuse    Asthma    Esophagitis    GERD (gastroesophageal reflux disease)    Headache    Hearing loss    Hypertension    Multiple sclerosis (HCC)    Pancreatitis    Vision abnormalities     Past Surgical History:  Procedure Laterality Date   ANKLE FRACTURE SURGERY Bilateral    CARPAL TUNNEL RELEASE Bilateral    CHOLECYSTECTOMY     COLONOSCOPY  11/2016   multiple   ESOPHAGOGASTRODUODENOSCOPY  11/2016   ESOPHAGOGASTRODUODENOSCOPY N/A 02/01/2017   Procedure: ESOPHAGOGASTRODUODENOSCOPY (EGD);  Surgeon: Avram Lupita FORBES, MD;  Location: [facility] ENDOSCOPY;  Service: Endoscopy;  Laterality: N/A;   ESOPHAGOGASTRODUODENOSCOPY (EGD) WITH PROPOFOL  N/A 02/04/2017   Procedure: ESOPHAGOGASTRODUODENOSCOPY (EGD) WITH PROPOFOL ;  Surgeon: Aneita Gwendlyn DASEN, MD;  Location: [facility] ENDOSCOPY;  Service: Endoscopy;  Laterality: N/A;   LAPAROSCOPIC GASTRIC SLEEVE RESECTION  2011   ULNAR NERVE REPAIR Bilateral     Family History  Problem Relation Age of Onset   Cancer Mother    Stroke Mother    Cancer Father    Social History:  reports that she has never smoked. She has never used smokeless tobacco. She reports current alcohol use of about 6.0 standard drinks of alcohol per week. She reports that she does not use drugs.  Allergies:  Allergies  Allergen Reactions   Ibuprofen  Other (See Comments)    Cannot take due to gastric bypass   Nsaids Other (See Comments)    Can not take due to Gastric bypass    Facility-Administered Medications Prior to Admission   Medication Dose Route Frequency Provider Last Rate Last Admin   sodium chloride  (PF) 0.9 % injection 2 mL  2 mL Intravenous PRN        Medications Prior to Admission  Medication Sig Dispense Refill   acetaminophen  (TYLENOL ) 500 MG tablet Take 2 tablets (1,000 mg total) by mouth every 6 (six) hours as needed for mild pain (pain score 1-3).     albuterol  (PROVENTIL ) (2.5 MG/3ML) 0.083% nebulizer solution Take 3 mLs (2.5 mg total) by nebulization every 6 (six) hours as needed for wheezing or shortness of breath. 150 mL 0   albuterol  (VENTOLIN  HFA) 108 (90 Base) MCG/ACT inhaler TAKE 2 PUFFS BY MOUTH EVERY 6 HOURS AS NEEDED FOR WHEEZE OR SHORTNESS OF BREATH 18 each 1   Ascorbic Acid (VITAMIN C PO) Take 1 tablet by mouth daily. 1 tablet daily     B Complex Vitamins (VITAMIN B COMPLEX PO) Take 1 tablet by mouth daily. 1 daily     baclofen  (LIORESAL ) 10 MG tablet TAKE 1 TABLET BY MOUTH THREE TIMES A DAY AS NEEDED FOR MUSCLE SPASM 270 tablet 0   benzonatate  (TESSALON ) 100 MG capsule Take 1 capsule (100 mg total) by mouth 3 (three) times daily as needed for cough. 30 capsule 0   bismuth  subsalicylate (PEPTO BISMOL) 262 MG/15ML suspension Take 30  mLs by mouth daily as needed for indigestion or diarrhea or loose stools (acid reflux).     budesonide -formoterol  (SYMBICORT ) 160-4.5 MCG/ACT inhaler Inhale 2 puffs into the lungs 2 (two) times daily. 1 each 3   busPIRone  (BUSPAR ) 7.5 MG tablet TAKE 2 TABLETS (15 MG TOTAL) BY MOUTH 2 (TWO) TIMES DAILY. 360 tablet 0   Calcium  Carbonate Antacid (TUMS PO) Take 2 tablets by mouth 2 (two) times daily as needed (acid reflux).     cephALEXin  (KEFLEX ) 500 MG capsule Take 1 capsule (500 mg total) by mouth 2 (two) times daily. 14 capsule 0   Cholecalciferol (D2000 ULTRA STRENGTH) 50 MCG (2000 UT) CAPS Take 2,000 Units by mouth daily.     diclofenac  Sodium (VOLTAREN ) 1 % GEL Apply 4 g topically 4 (four) times daily. (Patient taking differently: Apply 4 g topically 4 (four)  times daily as needed (joint pain).) 400 g 1   fluconazole  (DIFLUCAN ) 150 MG tablet Take 1 tablet (150 mg total) by mouth daily. 1 tablet 0   fluticasone  (FLONASE ) 50 MCG/ACT nasal spray SPRAY 2 SPRAYS INTO EACH NOSTRIL EVERY DAY 48 mL 3   gabapentin  (NEURONTIN ) 100 MG capsule TAKE 1 CAPSULE BY MOUTH THREE TIMES A DAY 90 capsule 1   HYDROcodone -acetaminophen  (NORCO) 5-325 MG tablet 1 tab po q 6 hours prn severe pain 10 tablet 0   Iron , Ferrous Sulfate , 325 (65 Fe) MG TABS 1 tab po bid (Patient taking differently: Take 1 tablet by mouth in the morning and at bedtime. 1 tab po bid) 60 tablet 3   modafinil  (PROVIGIL ) 200 MG tablet TAKE 1 TABLET BY MOUTH IN THE MORNING AND 1/2 TABLET IN THE EVENING. CALL [phone] TO SCHEDULE FOLLOW UP AROUND 02/10/24 FOR ONGOING REFILLS. 45 tablet 2   Multiple Vitamin (MULTIVITAMIN WITH MINERALS) TABS tablet Take 1 tablet by mouth daily.     nystatin  cream (MYCOSTATIN ) Apply 1 Application topically 2 (two) times daily. 30 g 1   ocrelizumab  (OCREVUS ) 300 MG/10ML injection INFUSE 600MG  INTRAVENOUSLY EVERY 6 MONTHS 20 mL 1   olmesartan  (BENICAR ) 5 MG tablet TAKE 2 TABLETS BY MOUTH EVERY DAY 180 tablet 3   ondansetron  (ZOFRAN ) 4 MG tablet Take 1 tablet (4 mg total) by mouth every 6 (six) hours. 12 tablet 0   oxybutynin  (DITROPAN ) 5 MG tablet TAKE 1 TABLET BY MOUTH TWICE A DAY 180 tablet 0   pantoprazole  (PROTONIX ) 40 MG tablet Take 1 tablet (40 mg total) by mouth daily. 30 tablet 0   pantoprazole  (PROTONIX ) 40 MG tablet TAKE 1 TABLET BY MOUTH EVERY DAY 30 tablet 3   potassium chloride  SA (KLOR-CON  M) 20 MEQ tablet Take 1 tablet (20 mEq total) by mouth daily. 90 tablet 3   Probiotic Product (PROBIOTIC DAILY PO) Take 1 tablet by mouth 3 (three) times a week. 1 daily      Results for orders placed or performed during the hospital encounter of 04/25/24 (from the past 48 hours)  CBC with Differential     Status: None   Collection Time: 04/25/24  7:26 PM  Result Value  Ref Range   WBC 7.5 4.0 - 10.5 K/uL   RBC 3.94 3.87 - 5.11 MIL/uL   Hemoglobin 13.0 12.0 - 15.0 g/dL   HCT 61.8 63.9 - 53.9 %   MCV 96.7 80.0 - 100.0 fL   MCH 33.0 26.0 - 34.0 pg   MCHC 34.1 30.0 - 36.0 g/dL   RDW 88.2 88.4 - 84.4 %   Platelets 331  150 - 400 K/uL   nRBC 0.0 0.0 - 0.2 %   Neutrophils Relative % 61 %   Neutro Abs 4.6 1.7 - 7.7 K/uL   Lymphocytes Relative 21 %   Lymphs Abs 1.6 0.7 - 4.0 K/uL   Monocytes Relative 11 %   Monocytes Absolute 0.9 0.1 - 1.0 K/uL   Eosinophils Relative 4 %   Eosinophils Absolute 0.3 0.0 - 0.5 K/uL   Basophils Relative 2 %   Basophils Absolute 0.1 0.0 - 0.1 K/uL   Immature Granulocytes 1 %   Abs Immature Granulocytes 0.04 0.00 - 0.07 K/uL    Comment: Performed at [facility], [address] [place], [place] [address]  Comprehensive metabolic panel     Status: Abnormal   Collection Time: 04/25/24  7:26 PM  Result Value Ref Range   Sodium 133 (L) 135 - 145 mmol/L   Potassium 4.0 3.5 - 5.1 mmol/L   Chloride 95 (L) 98 - 111 mmol/L   CO2 24 22 - 32 mmol/L   Glucose, Bld 111 (H) 70 - 99 mg/dL    Comment: Glucose reference range applies only to samples taken after fasting for at least 8 hours.   BUN 11 8 - 23 mg/dL   Creatinine, Ser 9.12 0.44 - 1.00 mg/dL   Calcium  9.4 8.9 - 10.3 mg/dL   Total Protein 6.5 6.5 - 8.1 g/dL   Albumin 4.5 3.5 - 5.0 g/dL   AST 31 15 - 41 U/L   ALT 13 0 - 44 U/L   Alkaline Phosphatase 64 38 - 126 U/L   Total Bilirubin 0.5 0.0 - 1.2 mg/dL   GFR, Estimated >39 >39 mL/min    Comment: (NOTE) Calculated using the CKD-EPI Creatinine Equation (2021)    Anion gap 14 5 - 15    Comment: Performed at [facility], [address] [place], [place] [address]   CT Lumbar Spine Wo Contrast Result Date: 04/25/2024 EXAM: CT OF THE LUMBAR SPINE WITHOUT CONTRAST 04/25/2024 08:34:00 PM TECHNIQUE: CT of the lumbar spine was performed without the administration of intravenous contrast. Multiplanar  reformatted images are provided for review. Automated exposure control, iterative reconstruction, and/or weight based adjustment of the mA/kV was utilized to reduce the radiation dose to as low as reasonably achievable. COMPARISON: CT abdomen and pelvis dated 10/09/2022. Normal. CLINICAL HISTORY: Back trauma, no prior imaging (Age >= 16y). Pt reports mechanical fall today, c/o R lower back pain, R rib pain. Reports hx of falls. FINDINGS: BONES AND ALIGNMENT: No evidence of acute fracture or traumatic lithesis in the lumbar spine. Acute mildly displaced fracture of the posterior right 11th rib . Unchanged mild grade 1 retrolisthesis of L1, L2, and L3. Chronic superior endplate compression fracture of L1 with 5 mm of retropulsion of the superior endplate causing mild spinal canal narrowing. DEGENERATIVE CHANGES: Posterior disc bulge at L3-L4 causes moderate-to-severe spinal canal narrowing. There is moderate neural foraminal narrowing on the right at L1-L2 and on the left at L5-S1. SOFT TISSUES: No acute abnormality. IMPRESSION: 1. Acute mildly displaced fracture of the posterior right 11th rib . 2. Unchanged compression fracture of L1 with 5 mm of retropulsion causing mild spinal canal narrowing. 3. Posterior disc bulge at L3-L4 causes moderate-to-severe spinal canal narrowing. Electronically signed by: Norman Gatlin MD 04/25/2024 08:55 PM EDT RP Workstation: [id number]   CT PELVIS WO CONTRAST Result Date: 04/25/2024 CLINICAL DATA:  Multiple falls, hip fracture suspected EXAM: CT PELVIS WITHOUT CONTRAST  TECHNIQUE: Multidetector CT imaging of the pelvis was performed following the standard protocol without intravenous contrast. RADIATION DOSE REDUCTION: This exam was performed according to the departmental dose-optimization program which includes automated exposure control, adjustment of the mA and/or kV according to patient size and/or use of iterative reconstruction technique. COMPARISON:  07/10/2023 FINDINGS:  Urinary Tract: Punctate focus of gas in the bladder lumen may reflect recent catheterization. Distal ureters are unremarkable. Bowel: No bowel obstruction or ileus. Extensive sigmoid diverticulosis without evidence of acute diverticulitis. Normal appendix is partially visualized within the right lower quadrant. No bowel wall thickening or inflammatory change. Vascular/Lymphatic: No significant vascular findings on this unenhanced exam. No pathologic adenopathy. Reproductive: Bilateral simple appearing ovarian cysts are identified, measuring 6.7 x 6.0 cm on the right and 2.9 x 2.3 cm on the left, not appreciably changed since prior study. Uterus is unremarkable. Other: No free fluid or free intraperitoneal gas. Small fat containing supraumbilical ventral hernia. Musculoskeletal: There are no acute displaced fractures. Prior healed left superior and inferior pubic rami fractures are again noted. Mild symmetrical bilateral hip osteoarthritis. Reconstructed images demonstrate no additional findings. IMPRESSION: 1. No acute pelvic fracture. 2. Sigmoid diverticulosis without diverticulitis. 3. Stable simple appearing bilateral ovarian cysts as above. Based on previous recommendation, follow-up pelvic ultrasound is recommended November 2025 to document 1 year stability. 4. Punctate focus of gas within the bladder lumen likely due to recent instrumentation. Electronically Signed   By: Ozell Daring M.D.   On: 04/25/2024 20:54   DG Chest Portable 1 View Result Date: 04/25/2024 CLINICAL DATA:  Tripped and fell, mid back pain EXAM: PORTABLE CHEST 1 VIEW COMPARISON:  01/22/2024 FINDINGS: Single frontal view of the chest demonstrates a stable cardiac silhouette. Continued ectasia of the thoracic aorta. No airspace disease, effusion, or pneumothorax. Multiple healed bilateral rib fractures are noted. The acute displaced right posterior ninth, tenth, and eleventh rib fracture seen on CT are more difficult to appreciate by  x-ray. IMPRESSION: 1. No acute intrathoracic process. Please see corresponding CT chest exam describing right ninth through eleventh rib fractures which are difficult to visualized by x-ray. Electronically Signed   By: Ozell Daring M.D.   On: 04/25/2024 20:50   CT Chest Wo Contrast Result Date: 04/25/2024 CLINICAL DATA:  Clemens today, right lower back and right rib pain EXAM: CT CHEST WITHOUT CONTRAST TECHNIQUE: Multidetector CT imaging of the chest was performed following the standard protocol without IV contrast. RADIATION DOSE REDUCTION: This exam was performed according to the departmental dose-optimization program which includes automated exposure control, adjustment of the mA and/or kV according to patient size and/or use of iterative reconstruction technique. COMPARISON:  06/19/2023 FINDINGS: Cardiovascular: Unenhanced imaging of the heart is unremarkable without pericardial effusion. Dilation of the ascending thoracic aorta measuring 4 cm. Assessment of the vascular lumen cannot be performed without intravenous contrast. Stable atherosclerosis of the aorta and coronary vasculature. Mediastinum/Nodes: No enlarged mediastinal or axillary lymph nodes. Thyroid  gland, trachea, and esophagus demonstrate no significant findings. Stable hiatal hernia. Lungs/Pleura: No acute airspace disease, effusion, or pneumothorax. Central airways are patent. Upper Abdomen: No acute abnormality. Musculoskeletal: There are minimally displaced acute fractures involving the right posterior ninth, tenth, and eleventh ribs. Numerous other prior healed bilateral rib fractures are also noted. Chronic T8 compression deformity unchanged. Reconstructed images demonstrate no additional findings. IMPRESSION: 1. Acute minimally displaced right posterior ninth, tenth, and eleventh rib fractures. 2. Stable hiatal hernia. 3. 4 cm ascending thoracic aortic aneurysm. Assessment of the vascular lumen is  limited without IV contrast. Recommend  annual imaging followup by CTA or MRA. This recommendation follows 2010 ACCF/AHA/AATS/ACR/ASA/SCA/SCAI/SIR/STS/SVM Guidelines for the Diagnosis and Management of Patients with Thoracic Aortic Disease. Circulation. 2010; 121: [id number]. Aortic aneurysm NOS (ICD10-I71.9) Electronically Signed   By: Ozell Daring M.D.   On: 04/25/2024 20:47   CT Head Wo Contrast Result Date: 04/25/2024 CLINICAL DATA:  Head trauma, coagulopathy EXAM: CT HEAD WITHOUT CONTRAST TECHNIQUE: Contiguous axial images were obtained from the base of the skull through the vertex without intravenous contrast. RADIATION DOSE REDUCTION: This exam was performed according to the departmental dose-optimization program which includes automated exposure control, adjustment of the mA and/or kV according to patient size and/or use of iterative reconstruction technique. COMPARISON:  08/03/2023 FINDINGS: Brain: Stable hypodensities throughout the periventricular white matter compatible with chronic small vessel ischemic changes. 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 No signs of acute infarct or hemorrhage. The lateral ventricles and remaining midline structures are unremarkable. No acute extra-axial fluid collections. No mass effect. Vascular: No hyperdense vessel or unexpected calcification. Skull: Normal. Negative for fracture or focal lesion. Sinuses/Orbits: Moderate mucoperiosteal thickening within the left maxillary sinus. Remaining paranasal sinuses are clear. Other: None. IMPRESSION: 1. No acute intracranial process. Chronic ischemic changes as above. Electronically Signed   By: Ozell Daring M.D.   On: 04/25/2024 20:17    Review of Systems  Constitutional: Negative.   HENT: Negative.    Eyes: Negative.   Respiratory: Negative.  Negative for shortness of breath.   Cardiovascular:  Positive for chest pain.  Gastrointestinal:  Negative for abdominal pain.  Endocrine: Negative.    Genitourinary: Negative.   Musculoskeletal:  Positive for back pain.  Allergic/Immunologic: Negative.   Neurological: Negative.   Hematological: Negative.   Psychiatric/Behavioral: Negative.      Blood pressure 129/77, pulse (!) 55, temperature (!) 97.5 F (36.4 C), temperature source Oral, resp. rate 18, height 5' 3 (1.6 m), weight 71.2 kg, last menstrual period 08/13/2014, SpO2 99%. Physical Exam HENT:     Mouth/Throat:     Mouth: Mucous membranes are moist.  Eyes:     Pupils: Pupils are equal, round, and reactive to light.  Cardiovascular:     Rate and Rhythm: Normal rate and regular rhythm.  Pulmonary:     Effort: Pulmonary effort is normal.     Breath sounds: No wheezing.     Comments: R lateral rib tenderness, no crepitance Chest:     Chest wall: Tenderness present.  Abdominal:     General: Abdomen is flat.     Palpations: Abdomen is soft.     Tenderness: There is no abdominal tenderness. There is no guarding or rebound.  Musculoskeletal:     Cervical back: No tenderness.     Comments: BLE edema  Skin:    Comments: Contusions and skin tear R FA  Neurological:     Mental Status: She is alert and oriented to person, place, and time.  Psychiatric:        Mood and Affect: Mood normal.      Assessment/Plan 62yo F S/P GLF  R rib FX 9-11 MS and mobility issues at baseline  Admit for multimodal pain control, PT/OT  Dann FORBES Hummer, MD 04/26/2024, 12:41 AM

## 2024-04-26 NOTE — Plan of Care (Signed)
  Problem: Education: Goal: Knowledge of General Education information will improve Description: Including pain rating scale, medication(s)/side effects and non-pharmacologic comfort measures 04/26/2024 1413 by Gumaro Brightbill K, RN Outcome: Progressing 04/26/2024 1412 by Christianna Belmonte K, RN Outcome: Progressing 04/26/2024 1412 by Altan Kraai K, RN Outcome: Progressing   Problem: Health Behavior/Discharge Planning: Goal: Ability to manage health-related needs will improve 04/26/2024 1413 by Fredrica Capano K, RN Outcome: Progressing 04/26/2024 1412 by Nicodemus Denk K, RN Outcome: Progressing 04/26/2024 1412 by Hashim Eichhorst K, RN Outcome: Progressing   Problem: Clinical Measurements: Goal: Ability to maintain clinical measurements within normal limits will improve 04/26/2024 1413 by Solace Wendorff K, RN Outcome: Progressing 04/26/2024 1412 by Asir Bingley K, RN Outcome: Progressing 04/26/2024 1412 by Olevia Westervelt K, RN Outcome: Progressing Goal: Will remain free from infection 04/26/2024 1413 by Wilene Pharo K, RN Outcome: Progressing 04/26/2024 1412 by Alley Neils K, RN Outcome: Progressing 04/26/2024 1412 by Taheerah Guldin K, RN Outcome: Progressing Goal: Diagnostic test results will improve 04/26/2024 1413 by Alan Riles K, RN Outcome: Progressing 04/26/2024 1412 by Adena Sima K, RN Outcome: Progressing 04/26/2024 1412 by Crystalyn Delia K, RN Outcome: Progressing Goal: Respiratory complications will improve 04/26/2024 1413 by Dionta Larke K, RN Outcome: Progressing 04/26/2024 1412 by Amiir Heckard K, RN Outcome: Progressing Goal: Cardiovascular complication will be avoided 04/26/2024 1413 by Azucena Dart K, RN Outcome: Progressing 04/26/2024 1412 by Shiana Rappleye K, RN Outcome: Progressing   Problem: Activity: Goal: Risk for activity intolerance will decrease 04/26/2024 1413 by Martese Vanatta K, RN Outcome: Progressing 04/26/2024 1412 by Sherron Mapp K, RN Outcome: Progressing   Problem: Nutrition: Goal: Adequate nutrition will be  maintained 04/26/2024 1413 by Quency Tober K, RN Outcome: Progressing 04/26/2024 1412 by Sherre Dee POUR, RN Outcome: Progressing   Problem: Coping: Goal: Level of anxiety will decrease 04/26/2024 1413 by Kiarah Eckstein K, RN Outcome: Progressing 04/26/2024 1412 by Sherre Dee POUR, RN Outcome: Progressing   Problem: Pain Managment: Goal: General experience of comfort will improve and/or be controlled 04/26/2024 1413 by Yarel Kilcrease K, RN Outcome: Progressing 04/26/2024 1412 by Jemmie Ledgerwood K, RN Outcome: Progressing   Problem: Skin Integrity: Goal: Risk for impaired skin integrity will decrease 04/26/2024 1413 by Marquist Binstock K, RN Outcome: Progressing 04/26/2024 1412 by Dashel Goines K, RN Outcome: Progressing   Problem: Safety: Goal: Ability to remain free from injury will improve 04/26/2024 1413 by Jocilynn Grade K, RN Outcome: Progressing 04/26/2024 1412 by Summit Arroyave K, RN Outcome: Progressing

## 2024-04-26 NOTE — Progress Notes (Signed)
 [facility] Progress Note     Subjective: CC:  Up in the chair. Reports posterior right chest wall pain, slightly improved with meds. Pulling 1200 cc in IS. States she had an episode of spit up this AM which she attributed to acid reflux. She was given tums and protonix  for this. At baseline she lives at home with her husband and walks with a cane  Objective: Vital signs in last 24 hours: Temp:  [97.5 F (36.4 C)-98.1 F (36.7 C)] 97.7 F (36.5 C) (09/07 0845) Pulse Rate:  [51-66] 52 (09/07 1043) Resp:  [17-20] 18 (09/07 1043) BP: (110-148)/(69-99) 133/79 (09/07 0845) SpO2:  [96 %-100 %] 100 % (09/07 0845) FiO2 (%):  [21 %] 21 % (09/07 0313) Weight:  [71.2 kg] 71.2 kg (09/06 1916) Last BM Date : 04/25/24  Intake/Output from previous day: No intake/output data recorded. Intake/Output this shift: Total I/O In: 120 [P.O.:120] Out: -   PE: Gen:  Alert, NAD, pleasant Card:  Regular rate and rhythm, there is bilateral lower extremity edema without pitting  Pulm:  Normal effort ORA; there is a bruise/abrasion of her right posterior chest wall without crepitus  Abd: Soft, non-tender, non-distended Skin: warm and dry, no rashes  Psych: A&Ox3   Lab Results:  Recent Labs    04/25/24 1926 04/26/24 0444  WBC 7.5 8.4  HGB 13.0 11.6*  HCT 38.1 33.8*  PLT 331 275   BMET Recent Labs    04/25/24 1926 04/26/24 0444  NA 133* 131*  K 4.0 3.7  CL 95* 94*  CO2 24 27  GLUCOSE 111* 85  BUN 11 9  CREATININE 0.87 0.75  CALCIUM  9.4 8.9   PT/INR No results for input(s): LABPROT, INR in the last 72 hours. CMP     Component Value Date/Time   NA 131 (L) 04/26/2024 0444   NA 135 07/04/2023 0955   K 3.7 04/26/2024 0444   CL 94 (L) 04/26/2024 0444   CO2 27 04/26/2024 0444   GLUCOSE 85 04/26/2024 0444   BUN 9 04/26/2024 0444   BUN 31 (H) 07/04/2023 0955   CREATININE 0.75 04/26/2024 0444   CREATININE 0.72 12/07/2022 1457   CREATININE 0.62 11/27/2018 1505    CALCIUM  8.9 04/26/2024 0444   PROT 6.5 04/25/2024 1926   PROT 6.8 06/02/2019 1549   ALBUMIN 4.5 04/25/2024 1926   ALBUMIN 4.3 06/02/2019 1549   AST 31 04/25/2024 1926   AST 17 12/07/2022 1457   ALT 13 04/25/2024 1926   ALT 9 12/07/2022 1457   ALKPHOS 64 04/25/2024 1926   BILITOT 0.5 04/25/2024 1926   BILITOT 0.5 12/07/2022 1457   GFRNONAA >60 04/26/2024 0444   GFRNONAA >60 12/07/2022 1457   GFRAA 100 06/02/2019 1549   Lipase     Component Value Date/Time   LIPASE 92 (H) 07/10/2023 1744       Studies/Results: CT Lumbar Spine Wo Contrast Result Date: 04/25/2024 EXAM: CT OF THE LUMBAR SPINE WITHOUT CONTRAST 04/25/2024 08:34:00 PM TECHNIQUE: CT of the lumbar spine was performed without the administration of intravenous contrast. Multiplanar reformatted images are provided for review. Automated exposure control, iterative reconstruction, and/or weight based adjustment of the mA/kV was utilized to reduce the radiation dose to as low as reasonably achievable. COMPARISON: CT abdomen and pelvis dated 10/09/2022. Normal. CLINICAL HISTORY: Back trauma, no prior imaging (Age >= 16y). Pt reports mechanical fall today, c/o R lower back pain, R rib pain. Reports hx of falls. FINDINGS: BONES AND ALIGNMENT: No evidence  of acute fracture or traumatic lithesis in the lumbar spine. Acute mildly displaced fracture of the posterior right 11th rib . Unchanged mild grade 1 retrolisthesis of L1, L2, and L3. Chronic superior endplate compression fracture of L1 with 5 mm of retropulsion of the superior endplate causing mild spinal canal narrowing. DEGENERATIVE CHANGES: Posterior disc bulge at L3-L4 causes moderate-to-severe spinal canal narrowing. There is moderate neural foraminal narrowing on the right at L1-L2 and on the left at L5-S1. SOFT TISSUES: No acute abnormality. IMPRESSION: 1. Acute mildly displaced fracture of the posterior right 11th rib . 2. Unchanged compression fracture of L1 with 5 mm of  retropulsion causing mild spinal canal narrowing. 3. Posterior disc bulge at L3-L4 causes moderate-to-severe spinal canal narrowing. Electronically signed by: Norman Gatlin MD 04/25/2024 08:55 PM EDT RP Workstation: [id number]   CT PELVIS WO CONTRAST Result Date: 04/25/2024 CLINICAL DATA:  Multiple falls, hip fracture suspected EXAM: CT PELVIS WITHOUT CONTRAST TECHNIQUE: Multidetector CT imaging of the pelvis was performed following the standard protocol without intravenous contrast. RADIATION DOSE REDUCTION: This exam was performed according to the departmental dose-optimization program which includes automated exposure control, adjustment of the mA and/or kV according to patient size and/or use of iterative reconstruction technique. COMPARISON:  07/10/2023 FINDINGS: Urinary Tract: Punctate focus of gas in the bladder lumen may reflect recent catheterization. Distal ureters are unremarkable. Bowel: No bowel obstruction or ileus. Extensive sigmoid diverticulosis without evidence of acute diverticulitis. Normal appendix is partially visualized within the right lower quadrant. No bowel wall thickening or inflammatory change. Vascular/Lymphatic: No significant vascular findings on this unenhanced exam. No pathologic adenopathy. Reproductive: Bilateral simple appearing ovarian cysts are identified, measuring 6.7 x 6.0 cm on the right and 2.9 x 2.3 cm on the left, not appreciably changed since prior study. Uterus is unremarkable. Other: No free fluid or free intraperitoneal gas. Small fat containing supraumbilical ventral hernia. Musculoskeletal: There are no acute displaced fractures. Prior healed left superior and inferior pubic rami fractures are again noted. Mild symmetrical bilateral hip osteoarthritis. Reconstructed images demonstrate no additional findings. IMPRESSION: 1. No acute pelvic fracture. 2. Sigmoid diverticulosis without diverticulitis. 3. Stable simple appearing bilateral ovarian cysts as above.  Based on previous recommendation, follow-up pelvic ultrasound is recommended November 2025 to document 1 year stability. 4. Punctate focus of gas within the bladder lumen likely due to recent instrumentation. Electronically Signed   By: Ozell Daring M.D.   On: 04/25/2024 20:54   DG Chest Portable 1 View Result Date: 04/25/2024 CLINICAL DATA:  Tripped and fell, mid back pain EXAM: PORTABLE CHEST 1 VIEW COMPARISON:  01/22/2024 FINDINGS: Single frontal view of the chest demonstrates a stable cardiac silhouette. Continued ectasia of the thoracic aorta. No airspace disease, effusion, or pneumothorax. Multiple healed bilateral rib fractures are noted. The acute displaced right posterior ninth, tenth, and eleventh rib fracture seen on CT are more difficult to appreciate by x-ray. IMPRESSION: 1. No acute intrathoracic process. Please see corresponding CT chest exam describing right ninth through eleventh rib fractures which are difficult to visualized by x-ray. Electronically Signed   By: Ozell Daring M.D.   On: 04/25/2024 20:50   CT Chest Wo Contrast Result Date: 04/25/2024 CLINICAL DATA:  Clemens today, right lower back and right rib pain EXAM: CT CHEST WITHOUT CONTRAST TECHNIQUE: Multidetector CT imaging of the chest was performed following the standard protocol without IV contrast. RADIATION DOSE REDUCTION: This exam was performed according to the departmental dose-optimization program which includes automated exposure control, adjustment of  the mA and/or kV according to patient size and/or use of iterative reconstruction technique. COMPARISON:  06/19/2023 FINDINGS: Cardiovascular: Unenhanced imaging of the heart is unremarkable without pericardial effusion. Dilation of the ascending thoracic aorta measuring 4 cm. Assessment of the vascular lumen cannot be performed without intravenous contrast. Stable atherosclerosis of the aorta and coronary vasculature. Mediastinum/Nodes: No enlarged mediastinal or axillary  lymph nodes. Thyroid  gland, trachea, and esophagus demonstrate no significant findings. Stable hiatal hernia. Lungs/Pleura: No acute airspace disease, effusion, or pneumothorax. Central airways are patent. Upper Abdomen: No acute abnormality. Musculoskeletal: There are minimally displaced acute fractures involving the right posterior ninth, tenth, and eleventh ribs. Numerous other prior healed bilateral rib fractures are also noted. Chronic T8 compression deformity unchanged. Reconstructed images demonstrate no additional findings. IMPRESSION: 1. Acute minimally displaced right posterior ninth, tenth, and eleventh rib fractures. 2. Stable hiatal hernia. 3. 4 cm ascending thoracic aortic aneurysm. Assessment of the vascular lumen is limited without IV contrast. Recommend annual imaging followup by CTA or MRA. This recommendation follows 2010 ACCF/AHA/AATS/ACR/ASA/SCA/SCAI/SIR/STS/SVM Guidelines for the Diagnosis and Management of Patients with Thoracic Aortic Disease. Circulation. 2010; 121: Z733-z630. Aortic aneurysm NOS (ICD10-I71.9) Electronically Signed   By: Ozell Daring M.D.   On: 04/25/2024 20:47   CT Head Wo Contrast Result Date: 04/25/2024 CLINICAL DATA:  Head trauma, coagulopathy EXAM: CT HEAD WITHOUT CONTRAST TECHNIQUE: Contiguous axial images were obtained from the base of the skull through the vertex without intravenous contrast. RADIATION DOSE REDUCTION: This exam was performed according to the departmental dose-optimization program which includes automated exposure control, adjustment of the mA and/or kV according to patient size and/or use of iterative reconstruction technique. COMPARISON:  08/03/2023 FINDINGS: Brain: Stable hypodensities throughout the periventricular white matter compatible with chronic small vessel ischemic changes. 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 No signs of acute infarct or hemorrhage. The lateral ventricles and  remaining midline structures are unremarkable. No acute extra-axial fluid collections. No mass effect. Vascular: No hyperdense vessel or unexpected calcification. Skull: Normal. Negative for fracture or focal lesion. Sinuses/Orbits: Moderate mucoperiosteal thickening within the left maxillary sinus. Remaining paranasal sinuses are clear. Other: None. IMPRESSION: 1. No acute intracranial process. Chronic ischemic changes as above. Electronically Signed   By: Ozell Daring M.D.   On: 04/25/2024 20:17    Anti-infectives: Anti-infectives (From admission, onward)    None        Assessment/Plan  63 y/o F s/p GLF  R Rib FX 9-10 - multimodal pain control, IS  MS and mobility issues at baseline - PT/OT  Urinary hesitancy vs retention - has been able to voice once this morning, check bladder scan and monitor   FEN: Reg diet, hyponatremia (131 from 133), SLIV, low dose salt tabs  ID: none VTE: SCD's, Lovenox   Dispo: med-surg, PT/OT  Home baclofen  and gabapentin  ordered     LOS: 0 days   I reviewed nursing notes, last 24 h vitals and pain scores, last 48 h intake and output, last 24 h labs and trends, and last 24 h imaging results.  This care required moderate level of medical decision making.   Almarie Pringle, PA-C [facility] Please see Amion for pager number during day hours 7:00am-4:30pm

## 2024-04-26 NOTE — Plan of Care (Signed)
  Problem: Safety: Goal: Ability to remain free from injury will improve 04/26/2024 1413 by Janeli Lewison K, RN Outcome: Adequate for Discharge 04/26/2024 1413 by Camika Marsico K, RN Outcome: Progressing 04/26/2024 1412 by Izacc Demeyer K, RN Outcome: Progressing   Problem: Pain Managment: Goal: General experience of comfort will improve and/or be controlled 04/26/2024 1413 by Kimberlye Dilger K, RN Outcome: Adequate for Discharge 04/26/2024 1413 by Sherre Dee POUR, RN Outcome: Progressing 04/26/2024 1412 by Cutler Sunday K, RN Outcome: Progressing   Problem: Skin Integrity: Goal: Risk for impaired skin integrity will decrease 04/26/2024 1413 by Laniqua Torrens K, RN Outcome: Adequate for Discharge 04/26/2024 1413 by Courteny Egler K, RN Outcome: Progressing 04/26/2024 1412 by Carroll Ranney K, RN Outcome: Progressing

## 2024-04-26 NOTE — Progress Notes (Signed)
 Transition of Care [facility]) - CAGE-AID Screening   Patient Details  Name: Catherine Hubbard MRN: [id number] Date of Birth: [birth year]  Transition of Care [facility]) CM/SW Contact:    Bernardino Mayotte, RN Phone Number: 04/26/2024, 6:30 AM   Clinical Narrative:  Patient endorses some alcohol use, denies illicit drugs. Resources not given at this time.  CAGE-AID Screening:    Have You Ever Felt You Ought to Cut Down on Your Drinking or Drug Use?: No Have People Annoyed You By Critizing Your Drinking Or Drug Use?: No Have You Felt Bad Or Guilty About Your Drinking Or Drug Use?: No Have You Ever Had a Drink or Used Drugs First Thing In The Morning to Steady Your Nerves or to Get Rid of a Hangover?: No CAGE-AID Score: 0  Substance Abuse Education Offered: No

## 2024-04-27 LAB — BASIC METABOLIC PANEL WITH GFR
Anion gap: 9 (ref 5–15)
BUN: 5 mg/dL — ABNORMAL LOW (ref 8–23)
CO2: 29 mmol/L (ref 22–32)
Calcium: 9.4 mg/dL (ref 8.9–10.3)
Chloride: 99 mmol/L (ref 98–111)
Creatinine, Ser: 0.62 mg/dL (ref 0.44–1.00)
GFR, Estimated: >60 mL/min (ref >60)
Glucose, Bld: 94 mg/dL (ref 70–99)
Potassium: 4.2 mmol/L (ref 3.5–5.1)
Sodium: 137 mmol/L (ref 135–145)

## 2024-04-27 MED ORDER — PANTOPRAZOLE SODIUM 40 MG PO TBEC: 40.0000 mg | DELAYED_RELEASE_TABLET | Freq: Every day | ORAL | Status: DC

## 2024-04-27 MED ORDER — ALBUTEROL SULFATE (2.5 MG/3ML) 0.083% IN NEBU
3.0000 mL | INHALATION_SOLUTION | Freq: Two times a day (BID) | RESPIRATORY_TRACT | Status: DC
  Administered 2024-04-27 – 2024-04-28 (×2): 3 mL via RESPIRATORY_TRACT
  Filled 2024-04-27 (×2): qty 3

## 2024-04-27 MED ORDER — OXYCODONE HCL 5 MG PO TABS
5.0000 mg | ORAL_TABLET | Freq: Once | ORAL | Status: AC
  Administered 2024-04-27: 5 mg via ORAL
  Filled 2024-04-27: qty 1

## 2024-04-27 MED ORDER — MAGNESIUM HYDROXIDE 400 MG/5ML PO SUSP
30.0000 mL | Freq: Once | ORAL | Status: AC
  Administered 2024-04-27: 30 mL via ORAL
  Filled 2024-04-27: qty 30

## 2024-04-27 MED ORDER — GABAPENTIN 100 MG PO CAPS
200.0000 mg | ORAL_CAPSULE | Freq: Three times a day (TID) | ORAL | Status: DC
Start: 2024-04-27 — End: 2024-04-28
  Administered 2024-04-27 – 2024-04-28 (×4): 200 mg via ORAL
  Filled 2024-04-27 (×4): qty 2

## 2024-04-27 MED ORDER — POLYETHYLENE GLYCOL 3350 17 G PO PACK
17.0000 g | PACK | Freq: Two times a day (BID) | ORAL | Status: DC
  Administered 2024-04-27: 17 g via ORAL
  Filled 2024-04-27 (×3): qty 1

## 2024-04-27 MED ORDER — BACLOFEN 10 MG PO TABS
10.0000 mg | ORAL_TABLET | Freq: Three times a day (TID) | ORAL | Status: DC
  Administered 2024-04-27 – 2024-04-28 (×4): 10 mg via ORAL
  Filled 2024-04-27 (×4): qty 1

## 2024-04-27 MED ORDER — BISACODYL 5 MG PO TBEC
10.0000 mg | DELAYED_RELEASE_TABLET | Freq: Once | ORAL | Status: AC
  Administered 2024-04-27: 10 mg via ORAL
  Filled 2024-04-27: qty 2

## 2024-04-27 MED ORDER — SENNA 8.6 MG PO TABS
2.0000 | ORAL_TABLET | Freq: Once | ORAL | Status: AC
  Administered 2024-04-27: 17.2 mg via ORAL
  Filled 2024-04-27: qty 2

## 2024-04-27 NOTE — Plan of Care (Signed)

## 2024-04-27 NOTE — TOC Initial Note (Addendum)
 Transition of Care [facility]) - Initial/Assessment Note    Patient Details  Name: Catherine Hubbard MRN: [id number] Date of Birth: 1961-06-29  Transition of Care [facility]) CM/SW Contact:    Sahej Schrieber M, RN Phone Number: 04/27/2024, 3:52 PM  Clinical Narrative:                 63 y/o F presenting to ED on 9/6 with fall on R side, found to have R rib fxs 9-11.  PTA, pt independent and lives at home with spouse, who can provide needed assistance at dc.  PT recommending OP therapy and RW for home; OT recommending tub/shower bench. Patient is agreeable to OP rehab referral at [facility] location.  She is agreeable to recommended DME, and was made aware that tub bench will not be covered by insurance.  Referral to [facility] for RW and tub bench, to be delivered to bedside prior to dc. Her PCP is Dr. Dallas Maxwell.     Patient appreciative of assistance.    Expected Discharge Plan: OP Rehab Barriers to Discharge: Continued Medical Work up   Patient Goals and CMS Choice Patient states their goals for this hospitalization and ongoing recovery are:: decreased pain          Expected Discharge Plan and Services   Discharge Planning Services: CM Consult   Living arrangements for the past 2 months: Single Family Home                 DME Arranged: Walker rolling, Tub bench   Date DME Agency Contacted: 04/27/24 Time DME Agency Contacted: 1551 Representative spoke with at DME Agency: Thomasina [place]            Prior Living Arrangements/Services Living arrangements for the past 2 months: Single Family Home Lives with:: Spouse Patient language and need for interpreter reviewed:: Yes Do you feel safe going back to the place where you live?: Yes      Need for Family Participation in Patient Care: Yes (Comment) Care giver support system in place?: Yes (comment) Current home services: DME Criminal Activity/Legal Involvement Pertinent to Current Situation/Hospitalization: No - Comment as  needed  Activities of Daily Living   ADL Screening (condition at time of admission) Independently performs ADLs?: Yes (appropriate for developmental age) Is the patient deaf or have difficulty hearing?: No Does the patient have difficulty seeing, even when wearing glasses/contacts?: No Does the patient have difficulty concentrating, remembering, or making decisions?: No                 Emotional Assessment   Attitude/Demeanor/Rapport: Engaged Affect (typically observed): Accepting Orientation: : Oriented to Self, Oriented to Place, Oriented to  Time, Oriented to Situation      Admission diagnosis:  Multiple rib fractures [S22.49XA] Rib fractures [S22.49XA] Patient Active Problem List   Diagnosis Date Noted   Rib fractures 04/26/2024   Wheezing 01/22/2024   SOB (shortness of breath) 01/22/2024   Osteoarthritis of left knee 08/29/2023   Leg swelling 06/25/2023   Chest wall hematoma 06/19/2023   Primary osteoarthritis of both knees 11/29/2022   Hematoma 11/29/2022   Post-traumatic osteoarthritis of right ankle 11/29/2022   Impingement syndrome of left ankle 11/29/2022   Iron  deficiency anemia 11/29/2022   Compression fracture of L1 lumbar vertebra (HCC) 11/29/2022   Estrogen deficiency 11/29/2022   Skin tear of forearm without complication, subsequent encounter 04/06/2022   Hyponatremia 03/28/2022   Frequent falls 03/28/2022   Hypomagnesemia 03/28/2022   Alcohol abuse  03/28/2022   GERD without esophagitis 03/28/2022   History of alcohol abuse 12/02/2019   Multiple rib fractures 04/14/2019   Multiple fractures of ribs, bilateral, initial encounter for closed fracture 04/14/2019   Closed 3-part fracture of proximal humerus with routine healing 04/01/2019   Multiple sclerosis exacerbation (HCC) 11/16/2018   Exacerbation of multiple sclerosis (HCC) 11/16/2018   Elevated troponin 09/26/2018   Acute hypokalemia 09/25/2018   Arthritis of carpometacarpal joint 03/27/2018    Nocturnal leg cramps 03/26/2018   Syncope due to orthostatic hypotension 02/12/2018   Diarrhea 02/08/2018   Urinary tract infection without hematuria 02/08/2018   Hematoma of scalp 02/08/2018   Neck pain 01/20/2018   Urine frequency 02/26/2017   Foreign body in esophagus    Esophageal stricture    Intractable nausea and vomiting 01/31/2017   Dysphagia    Severe protein-calorie malnutrition (HCC)    Potassium disorder 01/30/2017   Cystitis 01/30/2017   Periumbilical hernia 11/28/2016   Dysuria 11/28/2016   Calculus of gallbladder with acute on chronic cholecystitis without obstruction 11/28/2016   Costochondritis 05/11/2016   Chronic chest wall pain 04/17/2016   Trochanteric bursitis of both hips 03/28/2016   Mild intermittent asthma without complication 11/10/2015   Screening for colon cancer 11/10/2015   Routine history and physical examination of adult 11/10/2015   Bilateral low back pain with bilateral sciatica 07/13/2015   Lumbar radicular pain 03/21/2015   Other fatigue 01/11/2015   Dysesthesia 01/11/2015   Urinary urgency 01/11/2015   Relapsing remitting multiple sclerosis (HCC) 10/06/2014   Numbness 10/06/2014   Ataxic gait 10/06/2014   High risk medication use 10/06/2014   Gastric bypass status for obesity 10/06/2014   Cognitive changes 10/06/2014   Depression with anxiety 10/06/2014   Restless leg 10/06/2014   Insomnia 10/06/2014   Essential hypertension 10/06/2014   Postoperative state 10/06/2014   Multiple sclerosis (HCC) 02/16/2014   Difficulty hearing 01/13/2014   Other muscle spasm 01/13/2014   Hearing loss 01/13/2014   PCP:  Dorina Loving, PA-C Pharmacy:   [id number] - [place], [place] - [address] [place] [address] Phone: [phone] Fax: [phone]  [id number] - [place], [place] - [address] AT [facility] [address] [place] [address] Phone:  [phone] Fax: [phone]  [facility] [place], [place] - [address] [place] [address] Phone: [phone] Fax: [phone]  [facility] - [facility] [address] [place] [address] Phone: [phone] Fax: [phone]  [facility] [address] - [place], [place] - [address] [place] [address] Phone: [phone] Fax: [phone]  [facility]) [phone] - [place], [place] - [address] AT [facility] [address] [place] [address] Phone: [phone] Fax: [phone]     Social Drivers of Health (SDOH) Social History: SDOH Screenings   Food Insecurity: Low Risk  (10/21/2023)   Received from [facility]  Housing: Low Risk  (10/21/2023)   Received from [facility]  Transportation Needs: No Transportation Needs (10/21/2023)   Received from [facility]  Utilities: Low Risk  (10/21/2023)   Received from [facility]  Depression (PHQ2-9): Low Risk  (01/20/2024)  Tobacco Use: Low Risk  (04/26/2024)   SDOH Interventions:     Readmission Risk Interventions     No data to display         Mliss ORN.  Sabria Florido, RN, BSN  Trauma/Neuro ICU Case Manager [phone]

## 2024-04-27 NOTE — Discharge Summary (Signed)
 Physician Discharge Summary  Patient ID: Catherine Hubbard MRN: [id number] DOB/AGE: 64/24/1962 64 y.o.  Admit date: 04/25/2024 Discharge date: 04/28/2024  Discharge Diagnoses Ground level fall Right 9-11 rib fractures Hyponatremia, resolved  Consultants None   Procedures None   HPI: Patient is a 63 year old female with PMH significant for MS who presented to [facility] ED s/p fall. She was ambulating with a cane and tripped and fell on her right side. Denied LOC or striking head. Complained of pain in right chest wall and found to have rib fractures. Admitted to trauma for pain control and therapies.   Hospital Course: Patient evaluated by therapies and recommended for outpatient PT and a rolling walker, which have been arranged. Hospital course complicated by hyponatremia which resolved with salt tabs. On 04/28/24 patient was stable for discharge home with her husband and follow up with PCP for pain control as outlined below.   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 What changed: Another medication with the same name was added. Make sure you understand how and when to take each.   acetaminophen  500 MG  tablet Commonly known as: TYLENOL  Take 2 tablets (1,000 mg total) by mouth every 6 (six) hours. What changed: You were already taking a medication with the same name, and this prescription was added. Make sure you understand how and when to take each.   albuterol  108 (90 Base) MCG/ACT inhaler Commonly known as: VENTOLIN  HFA TAKE 2 PUFFS BY MOUTH EVERY 6 HOURS AS NEEDED FOR WHEEZE OR SHORTNESS OF BREATH What changed: Another medication with the same name was removed. Continue taking this medication, and follow the directions you see here.   baclofen  10 MG tablet Commonly known as: LIORESAL  Take 1 tablet (10 mg total) by mouth 3 (three) times daily as needed for muscle spasms. What changed: See the new instructions.   bismuth  subsalicylate 262 MG/15ML suspension Commonly known as: PEPTO BISMOL Take 30 mLs by mouth every 4 (four) hours as needed for indigestion or diarrhea or loose stools (acid reflux).   budesonide -formoterol  160-4.5 MCG/ACT inhaler Commonly known as: SYMBICORT  Inhale 2 puffs into the lungs 2 (two) times daily. What changed:  when to take this reasons to take this   busPIRone  7.5 MG tablet Commonly known as: BUSPAR  TAKE 2 TABLETS (15 MG TOTAL) BY MOUTH 2 (TWO) TIMES DAILY.   D2000 Ultra Strength 50 MCG (2000 UT) Caps Generic drug: Cholecalciferol Take 4,000 Units by mouth daily.   docusate sodium  100 MG capsule Commonly known as: COLACE Take 1 capsule (100 mg  total) by mouth daily as needed for mild constipation.   fluticasone  50 MCG/ACT nasal spray Commonly known as: FLONASE  SPRAY 2 SPRAYS INTO EACH NOSTRIL EVERY DAY What changed: See the new instructions.   gabapentin  100 MG capsule Commonly known as: NEURONTIN  Take 2 capsules (200 mg total) by mouth 3 (three) times daily. What changed: See the new instructions.   lidocaine  5 % Commonly known as: LIDODERM  Place 1 patch onto the skin daily. Remove & Discard patch within 12 hours or as directed by MD    modafinil  200 MG tablet Commonly known as: PROVIGIL  TAKE 1 TABLET BY MOUTH IN THE MORNING AND 1/2 TABLET IN THE EVENING. CALL [phone] TO SCHEDULE FOLLOW UP AROUND 02/10/24 FOR ONGOING REFILLS. What changed: See the new instructions.   multivitamin with minerals Tabs tablet Take 1 tablet by mouth daily.   nystatin  cream Commonly known as: MYCOSTATIN  Apply 1 Application topically 2 (two) times daily. What changed:  when to take this reasons to take this   olmesartan  5 MG tablet Commonly known as: BENICAR  TAKE 2 TABLETS BY MOUTH EVERY DAY What changed: how much to take   omeprazole  20 MG capsule Commonly known as: PRILOSEC Take 20 mg by mouth at bedtime.   oxybutynin  5 MG tablet Commonly known as: DITROPAN  TAKE 1 TABLET BY MOUTH TWICE A DAY What changed:  how much to take when to take this reasons to take this   oxyCODONE  5 MG immediate release tablet Commonly known as: Oxy IR/ROXICODONE  Take 1-2 tablets (5-10 mg total) by mouth every 4 (four) hours as needed for moderate pain (pain score 4-6).   pantoprazole  40 MG tablet Commonly known as: PROTONIX  TAKE 1 TABLET BY MOUTH EVERY DAY   PROBIOTIC DAILY PO Take 1 tablet by mouth 3 (three) times a week.   TUMS PO Take 2 tablets by mouth 2 (two) times daily as needed (acid reflux).   VITAMIN B COMPLEX PO Take 1 tablet by mouth daily. 1 daily               Durable Medical Equipment  (From admission, onward)           Start     Ordered   04/28/24 0937  For home use only DME Tub bench  Once        04/28/24 0936   04/27/24 1242  For home use only DME Walker rolling  Once       Question Answer Comment  Walker: With 5 Inch Wheels   Patient needs a walker to treat with the following condition Multiple rib fractures      04/27/24 1242              Follow-up Information     Saguier, [place], PA-C. Schedule an appointment as soon as possible for a visit in 1 week(s).   Specialties: Internal  Medicine, Family Medicine Why: To follow up for pain control for rib fractures upon hospital discharge. Contact information: [address] [place] [address] [phone]         [facility] at [facility]. Call.   Specialty: Rehabilitation Why: Call ASAP to schedule outpatient physical therapy appointment; an electronic referral has been made on your behalf. Contact information: [address] [facility]. [place]  [address] [phone]                Signed: Burnard JONELLE Louder , [facility] 04/28/2024, 9:36 AM Please see  Amion for pager number during day hours 7:00am-4:30pm

## 2024-04-27 NOTE — Progress Notes (Signed)
 [facility] Progress Note     Subjective: Increased pain this AM, tried to get up on her own overnight and sounds like has been unable to get caught up from a pain control standpoint this AM. Patient also having trouble with salt tabs, causing some spasm/gagging. She lives at home with her husband and it will be just the two of them at discharge.   Objective: Vital signs in last 24 hours: Temp:  [97.7 F (36.5 C)-98.3 F (36.8 C)] 97.7 F (36.5 C) (09/08 0823) Pulse Rate:  [52-75] 55 (09/08 0823) Resp:  [16-19] 16 (09/08 0823) BP: (132-147)/(76-83) 140/79 (09/08 0823) SpO2:  [95 %-98 %] 95 % (09/08 0823) Last BM Date : 04/25/24  Intake/Output from previous day: 09/07 0701 - 09/08 0700 In: 1180 [P.O.:1180] Out: -  Intake/Output this shift: Total I/O In: 120 [P.O.:120] Out: -   PE: Gen:  Alert, NAD, pleasant Card:  Regular rate and rhythm Pulm:  Normal effort ORA Abd: Soft, non-tender, non-distended Skin: warm and dry, no rashes  Psych: A&Ox3   Lab Results:  Recent Labs    04/25/24 1926 04/26/24 0444  WBC 7.5 8.4  HGB 13.0 11.6*  HCT 38.1 33.8*  PLT 331 275   BMET Recent Labs    04/25/24 1926 04/26/24 0444  NA 133* 131*  K 4.0 3.7  CL 95* 94*  CO2 24 27  GLUCOSE 111* 85  BUN 11 9  CREATININE 0.87 0.75  CALCIUM  9.4 8.9   PT/INR No results for input(s): LABPROT, INR in the last 72 hours. CMP     Component Value Date/Time   NA 131 (L) 04/26/2024 0444   NA 135 07/04/2023 0955   K 3.7 04/26/2024 0444   CL 94 (L) 04/26/2024 0444   CO2 27 04/26/2024 0444   GLUCOSE 85 04/26/2024 0444   BUN 9 04/26/2024 0444   BUN 31 (H) 07/04/2023 0955   CREATININE 0.75 04/26/2024 0444   CREATININE 0.72 12/07/2022 1457   CREATININE 0.62 11/27/2018 1505   CALCIUM  8.9 04/26/2024 0444   PROT 6.5 04/25/2024 1926   PROT 6.8 06/02/2019 1549   ALBUMIN 4.5 04/25/2024 1926   ALBUMIN 4.3 06/02/2019 1549   AST 31 04/25/2024 1926   AST 17 12/07/2022 1457    ALT 13 04/25/2024 1926   ALT 9 12/07/2022 1457   ALKPHOS 64 04/25/2024 1926   BILITOT 0.5 04/25/2024 1926   BILITOT 0.5 12/07/2022 1457   GFRNONAA >60 04/26/2024 0444   GFRNONAA >60 12/07/2022 1457   GFRAA 100 06/02/2019 1549   Lipase     Component Value Date/Time   LIPASE 92 (H) 07/10/2023 1744       Studies/Results: CT Lumbar Spine Wo Contrast Result Date: 04/25/2024 EXAM: CT OF THE LUMBAR SPINE WITHOUT CONTRAST 04/25/2024 08:34:00 PM TECHNIQUE: CT of the lumbar spine was performed without the administration of intravenous contrast. Multiplanar reformatted images are provided for review. Automated exposure control, iterative reconstruction, and/or weight based adjustment of the mA/kV was utilized to reduce the radiation dose to as low as reasonably achievable. COMPARISON: CT abdomen and pelvis dated 10/09/2022. Normal. CLINICAL HISTORY: Back trauma, no prior imaging (Age >= 16y). Pt reports mechanical fall today, c/o R lower back pain, R rib pain. Reports hx of falls. FINDINGS: BONES AND ALIGNMENT: No evidence of acute fracture or traumatic lithesis in the lumbar spine. Acute mildly displaced fracture of the posterior right 11th rib . Unchanged mild grade 1 retrolisthesis of L1, L2, and L3. Chronic superior  endplate compression fracture of L1 with 5 mm of retropulsion of the superior endplate causing mild spinal canal narrowing. DEGENERATIVE CHANGES: Posterior disc bulge at L3-L4 causes moderate-to-severe spinal canal narrowing. There is moderate neural foraminal narrowing on the right at L1-L2 and on the left at L5-S1. SOFT TISSUES: No acute abnormality. IMPRESSION: 1. Acute mildly displaced fracture of the posterior right 11th rib . 2. Unchanged compression fracture of L1 with 5 mm of retropulsion causing mild spinal canal narrowing. 3. Posterior disc bulge at L3-L4 causes moderate-to-severe spinal canal narrowing. Electronically signed by: Norman Gatlin MD 04/25/2024 08:55 PM EDT RP  Workstation: [id number]   CT PELVIS WO CONTRAST Result Date: 04/25/2024 CLINICAL DATA:  Multiple falls, hip fracture suspected EXAM: CT PELVIS WITHOUT CONTRAST TECHNIQUE: Multidetector CT imaging of the pelvis was performed following the standard protocol without intravenous contrast. RADIATION DOSE REDUCTION: This exam was performed according to the departmental dose-optimization program which includes automated exposure control, adjustment of the mA and/or kV according to patient size and/or use of iterative reconstruction technique. COMPARISON:  07/10/2023 FINDINGS: Urinary Tract: Punctate focus of gas in the bladder lumen may reflect recent catheterization. Distal ureters are unremarkable. Bowel: No bowel obstruction or ileus. Extensive sigmoid diverticulosis without evidence of acute diverticulitis. Normal appendix is partially visualized within the right lower quadrant. No bowel wall thickening or inflammatory change. Vascular/Lymphatic: No significant vascular findings on this unenhanced exam. No pathologic adenopathy. Reproductive: Bilateral simple appearing ovarian cysts are identified, measuring 6.7 x 6.0 cm on the right and 2.9 x 2.3 cm on the left, not appreciably changed since prior study. Uterus is unremarkable. Other: No free fluid or free intraperitoneal gas. Small fat containing supraumbilical ventral hernia. Musculoskeletal: There are no acute displaced fractures. Prior healed left superior and inferior pubic rami fractures are again noted. Mild symmetrical bilateral hip osteoarthritis. Reconstructed images demonstrate no additional findings. IMPRESSION: 1. No acute pelvic fracture. 2. Sigmoid diverticulosis without diverticulitis. 3. Stable simple appearing bilateral ovarian cysts as above. Based on previous recommendation, follow-up pelvic ultrasound is recommended November 2025 to document 1 year stability. 4. Punctate focus of gas within the bladder lumen likely due to recent instrumentation.  Electronically Signed   By: Ozell Daring M.D.   On: 04/25/2024 20:54   DG Chest Portable 1 View Result Date: 04/25/2024 CLINICAL DATA:  Tripped and fell, mid back pain EXAM: PORTABLE CHEST 1 VIEW COMPARISON:  01/22/2024 FINDINGS: Single frontal view of the chest demonstrates a stable cardiac silhouette. Continued ectasia of the thoracic aorta. No airspace disease, effusion, or pneumothorax. Multiple healed bilateral rib fractures are noted. The acute displaced right posterior ninth, tenth, and eleventh rib fracture seen on CT are more difficult to appreciate by x-ray. IMPRESSION: 1. No acute intrathoracic process. Please see corresponding CT chest exam describing right ninth through eleventh rib fractures which are difficult to visualized by x-ray. Electronically Signed   By: Ozell Daring M.D.   On: 04/25/2024 20:50   CT Chest Wo Contrast Result Date: 04/25/2024 CLINICAL DATA:  Clemens today, right lower back and right rib pain EXAM: CT CHEST WITHOUT CONTRAST TECHNIQUE: Multidetector CT imaging of the chest was performed following the standard protocol without IV contrast. RADIATION DOSE REDUCTION: This exam was performed according to the departmental dose-optimization program which includes automated exposure control, adjustment of the mA and/or kV according to patient size and/or use of iterative reconstruction technique. COMPARISON:  06/19/2023 FINDINGS: Cardiovascular: Unenhanced imaging of the heart is unremarkable without pericardial effusion. Dilation of the ascending  thoracic aorta measuring 4 cm. Assessment of the vascular lumen cannot be performed without intravenous contrast. Stable atherosclerosis of the aorta and coronary vasculature. Mediastinum/Nodes: No enlarged mediastinal or axillary lymph nodes. Thyroid  gland, trachea, and esophagus demonstrate no significant findings. Stable hiatal hernia. Lungs/Pleura: No acute airspace disease, effusion, or pneumothorax. Central airways are patent. Upper  Abdomen: No acute abnormality. Musculoskeletal: There are minimally displaced acute fractures involving the right posterior ninth, tenth, and eleventh ribs. Numerous other prior healed bilateral rib fractures are also noted. Chronic T8 compression deformity unchanged. Reconstructed images demonstrate no additional findings. IMPRESSION: 1. Acute minimally displaced right posterior ninth, tenth, and eleventh rib fractures. 2. Stable hiatal hernia. 3. 4 cm ascending thoracic aortic aneurysm. Assessment of the vascular lumen is limited without IV contrast. Recommend annual imaging followup by CTA or MRA. This recommendation follows 2010 ACCF/AHA/AATS/ACR/ASA/SCA/SCAI/SIR/STS/SVM Guidelines for the Diagnosis and Management of Patients with Thoracic Aortic Disease. Circulation. 2010; 121: Z733-z630. Aortic aneurysm NOS (ICD10-I71.9) Electronically Signed   By: Ozell Daring M.D.   On: 04/25/2024 20:47   CT Head Wo Contrast Result Date: 04/25/2024 CLINICAL DATA:  Head trauma, coagulopathy EXAM: CT HEAD WITHOUT CONTRAST TECHNIQUE: Contiguous axial images were obtained from the base of the skull through the vertex without intravenous contrast. RADIATION DOSE REDUCTION: This exam was performed according to the departmental dose-optimization program which includes automated exposure control, adjustment of the mA and/or kV according to patient size and/or use of iterative reconstruction technique. COMPARISON:  08/03/2023 FINDINGS: Brain: Stable hypodensities throughout the periventricular white matter compatible with chronic small vessel ischemic changes. 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 No signs of acute infarct or hemorrhage. The lateral ventricles and remaining midline structures are unremarkable. No acute extra-axial fluid collections. No mass effect. Vascular: No hyperdense vessel or unexpected calcification. Skull: Normal. Negative for fracture or focal  lesion. Sinuses/Orbits: Moderate mucoperiosteal thickening within the left maxillary sinus. Remaining paranasal sinuses are clear. Other: None. IMPRESSION: 1. No acute intracranial process. Chronic ischemic changes as above. Electronically Signed   By: Ozell Daring M.D.   On: 04/25/2024 20:17    Anti-infectives: Anti-infectives (From admission, onward)    None        Assessment/Plan  63 y/o F s/p GLF  R Rib FX 9-10 - multimodal pain control, IS  MS and mobility issues at baseline - PT/OT  Urinary hesitancy vs retention - Pt reports some urinary leaking at baseline, has been able to urinate Hyponatremia - Na 131 yesterday, on salt tabs but struggling with keeping that down. Repeat BMET today    FEN: Reg diet ID: none VTE: SCD's, Lovenox   Dispo: 5N, work on pain control this AM and recheck BMET. Possible DC later today vs tomorrow pending improvement  Home baclofen  and gabapentin  increased this AM    LOS: 1 day   I reviewed nursing notes, last 24 h vitals and pain scores, last 48 h intake and output, last 24 h labs and trends, and last 24 h imaging results.  This care required moderate level of medical decision making.   Burnard JONELLE Louder, [facility] 04/27/2024, 9:45 AM Please see Amion for pager number during day hours 7:00am-4:30pm

## 2024-04-27 NOTE — Progress Notes (Signed)
 Physical Therapy Treatment Patient Details Name: Catherine Hubbard MRN: [id number] DOB: 1961-01-01 Today's Date: 04/27/2024   History of Present Illness 63 y/o F presenting to ED on 9/6 with fall on R side, found to have R rib fxs 9-11    PMH includes MS    PT Comments  Pt began having increased pain in the night last night and continues to have increased pain compared to yesterday per her report, however, from a mobility standpoint continues to progress. Was still able to achieve incentive spirometer goal of 1250. Ambulated 200' with RW and CGA. Recommend RW for home and instructed her to use it next 2 weeks at least instead of her cane, given her  multiple fall history, increased pain, and unilateral pain and SPC being a unilateral device. Pt in agreement. Recommend outpt PT for balance after d/c. PT will continue to follow.     If plan is discharge home, recommend the following: Assistance with cooking/housework;Assist for transportation;Help with stairs or ramp for entrance   Can travel by private vehicle        Equipment Recommendations  Rolling walker (2 wheels)    Recommendations for Other Services       Precautions / Restrictions Precautions Precautions: Fall Precaution/Restrictions Comments: h/o multiple falls Restrictions Weight Bearing Restrictions Per Provider Order: No     Mobility  Bed Mobility               General bed mobility comments: pt received in recliner. Reviewed sequencing of bed mobility verbally with pt    Transfers Overall transfer level: Needs assistance Equipment used: Rolling walker (2 wheels) Transfers: Sit to/from Stand Sit to Stand: Contact guard assist           General transfer comment: STS 3x from recliner to RW, CGA and increased time needed due to pain    Ambulation/Gait Ambulation/Gait assistance: Supervision Gait Distance (Feet): 200 Feet Assistive device: Rolling walker (2 wheels) Gait Pattern/deviations:  Decreased step length - right, Decreased step length - left, Trunk flexed Gait velocity: slowed Gait velocity interpretation: <1.8 ft/sec, indicate of risk for recurrent falls   General Gait Details: vc's for fwd gaze. Pt feels more steady and with less pain with use of RW. Given her fall history, rec RW for first 2 weeks home before returning to [facility]   Stairs             Wheelchair Mobility     Tilt Bed    Modified Rankin (Stroke Patients Only)       Balance Overall balance assessment: Needs assistance, History of Falls Sitting-balance support: No upper extremity supported, Feet supported Sitting balance-Leahy Scale: Good     Standing balance support: No upper extremity supported, During functional activity Standing balance-Leahy Scale: Fair Standing balance comment: able to maintain static stance without UE support but moving at a pace insufficient to correct for perturbation without UE support               High Level Balance Comments: sidestepping R and L x15 with hallway rail            Communication Communication Communication: Impaired Factors Affecting Communication: Hearing impaired (hearing aids)  Cognition Arousal: Alert Behavior During Therapy: WFL for tasks assessed/performed   PT - Cognitive impairments: No apparent impairments                         Following commands: Intact  Cueing Cueing Techniques: Verbal cues  Exercises      General Comments General comments (skin integrity, edema, etc.): Pulled 1250 on IS despite increased pain      Pertinent Vitals/Pain Pain Assessment Pain Assessment: Faces Faces Pain Scale: Hurts even more Pain Location: R ribs Pain Descriptors / Indicators: Grimacing, Guarding, Sore Pain Intervention(s): Limited activity within patient's tolerance, Monitored during session, Premedicated before session    Home Living                          Prior Function            PT  Goals (current goals can now be found in the care plan section) Acute Rehab PT Goals Patient Stated Goal: Be able to manage at home PT Goal Formulation: With patient Time For Goal Achievement: 05/10/24 Potential to Achieve Goals: Good Progress towards PT goals: Progressing toward goals    Frequency    Min 2X/week      PT Plan      Co-evaluation              AM-PAC PT 6 Clicks Mobility   Outcome Measure  Help needed turning from your back to your side while in a flat bed without using bedrails?: None Help needed moving from lying on your back to sitting on the side of a flat bed without using bedrails?: None Help needed moving to and from a bed to a chair (including a wheelchair)?: A Little Help needed standing up from a chair using your arms (e.g., wheelchair or bedside chair)?: A Little Help needed to walk in hospital room?: A Little Help needed climbing 3-5 steps with a railing? : A Little 6 Click Score: 20    End of Session Equipment Utilized During Treatment: Gait belt Activity Tolerance: Patient tolerated treatment well Patient left: in chair;with call bell/phone within reach Nurse Communication: Mobility status PT Visit Diagnosis: Other abnormalities of gait and mobility (R26.89);Pain Pain - Right/Left: Right Pain - part of body:  (Ribs)     Time: 8971-8943 PT Time Calculation (min) (ACUTE ONLY): 28 min  Charges:    $Gait Training: 23-37 mins PT General Charges $$ ACUTE PT VISIT: 1 Visit                     Richerd Lipoma, PT  Acute Rehab Services Secure chat preferred Office [phone]    Richerd CROME Strider Vallance 04/27/2024, 11:18 AM

## 2024-04-28 ENCOUNTER — Encounter: Payer: Self-pay | Admitting: Family

## 2024-04-28 ENCOUNTER — Other Ambulatory Visit (HOSPITAL_COMMUNITY): Payer: Self-pay

## 2024-04-28 MED ORDER — OXYCODONE HCL 5 MG PO TABS
5.0000 mg | ORAL_TABLET | ORAL | 0 refills | Status: AC | PRN
  Filled 2024-04-28: qty 30, 8d supply, fill #0

## 2024-04-28 MED ORDER — DOCUSATE SODIUM 100 MG PO CAPS: 100.0000 mg | ORAL_CAPSULE | Freq: Every day | ORAL | Status: AC | PRN

## 2024-04-28 MED ORDER — ACETAMINOPHEN 500 MG PO TABS
1000.0000 mg | ORAL_TABLET | Freq: Four times a day (QID) | ORAL | 0 refills | Status: AC
  Filled 2024-04-28: qty 30, 4d supply, fill #0

## 2024-04-28 MED ORDER — LIDOCAINE 5 % EX PTCH
1.0000 | MEDICATED_PATCH | CUTANEOUS | 0 refills | Status: AC
  Filled 2024-04-28: qty 30, 30d supply, fill #0

## 2024-04-28 MED ORDER — BACLOFEN 10 MG PO TABS
10.0000 mg | ORAL_TABLET | Freq: Three times a day (TID) | ORAL | 0 refills | Status: DC | PRN
  Filled 2024-04-28: qty 30, 10d supply, fill #0

## 2024-04-28 MED ORDER — GABAPENTIN 100 MG PO CAPS
200.0000 mg | ORAL_CAPSULE | Freq: Three times a day (TID) | ORAL | 0 refills | Status: AC
  Filled 2024-04-28: qty 30, 5d supply, fill #0

## 2024-04-28 NOTE — Plan of Care (Signed)

## 2024-04-28 NOTE — Progress Notes (Signed)
 Mobility Specialist Progress Note:    04/28/24 1248  Mobility  Activity Ambulated with assistance  Level of Assistance Contact guard assist, steadying assist  Assistive Device Front wheel walker  Distance Ambulated (ft) 200 ft  Activity Response Tolerated well  Mobility Referral Yes  Mobility visit 1 Mobility  Mobility Specialist Start Time (ACUTE ONLY) 1033  Mobility Specialist Stop Time (ACUTE ONLY) 1050  Mobility Specialist Time Calculation (min) (ACUTE ONLY) 17 min   Pt received in bed asleep but easily aroused and agreeable to mobility. No physical assistance required, contact guard for safety. Pt c/o moderate back pain, otherwise tolerated well. Returned to room w/o fault. Requested to use BR. After finishing left in bed w/ call bell and personal belongings in reach. All needs met. RN in room.  Thersia Minder Mobility Specialist  Please contact vis Secure Chat or  Rehab Office [phone]

## 2024-04-28 NOTE — Progress Notes (Addendum)
 Pt discharged home with spouse in stable condition. DC instructions and TOC meds provided to pt. No immediate questions or concerns at this time. DC'd from unit via wheelchair

## 2024-04-28 NOTE — Plan of Care (Signed)
  Problem: Acute Rehab PT Goals(only PT should resolve) Goal: Patient Will Transfer Sit To/From Stand Outcome: Completed/Met Goal: Pt Will Ambulate Outcome: Completed/Met Goal: Pt Will Go Up/Down Stairs Outcome: Completed/Met Goal: Pt/caregiver will Perform Home Exercise Program Outcome: Completed/Met   Problem: Acute Rehab OT Goals (only OT should resolve) Goal: Pt. Will Perform Upper Body Dressing Outcome: Completed/Met Goal: Pt. Will Perform Lower Body Dressing Outcome: Completed/Met Goal: Pt. Will Transfer To Toilet Outcome: Completed/Met Goal: Pt. Will Perform Tub/Shower Transfer Outcome: Completed/Met   Problem: Education: Goal: Knowledge of General Education information will improve Description: Including pain rating scale, medication(s)/side effects and non-pharmacologic comfort measures Outcome: Completed/Met   Problem: Health Behavior/Discharge Planning: Goal: Ability to manage health-related needs will improve Outcome: Completed/Met   Problem: Clinical Measurements: Goal: Ability to maintain clinical measurements within normal limits will improve Outcome: Completed/Met Goal: Will remain free from infection Outcome: Completed/Met Goal: Diagnostic test results will improve Outcome: Completed/Met Goal: Respiratory complications will improve Outcome: Completed/Met Goal: Cardiovascular complication will be avoided Outcome: Completed/Met   Problem: Activity: Goal: Risk for activity intolerance will decrease Outcome: Completed/Met   Problem: Nutrition: Goal: Adequate nutrition will be maintained Outcome: Completed/Met   Problem: Coping: Goal: Level of anxiety will decrease Outcome: Completed/Met   Problem: Elimination: Goal: Will not experience complications related to bowel motility Outcome: Completed/Met Goal: Will not experience complications related to urinary retention Outcome: Completed/Met   Problem: Pain Managment: Goal: General experience of  comfort will improve and/or be controlled Outcome: Completed/Met   Problem: Safety: Goal: Ability to remain free from injury will improve Outcome: Completed/Met   Problem: Skin Integrity: Goal: Risk for impaired skin integrity will decrease Outcome: Completed/Met

## 2024-04-28 NOTE — Progress Notes (Signed)
 RN discharged.  Volunteer service transferred to MAIN A.

## 2024-04-29 ENCOUNTER — Telehealth: Payer: Self-pay

## 2024-04-29 NOTE — Transitions of Care (Post Inpatient/ED Visit) (Signed)
   04/29/2024  Name: Catherine Hubbard MRN: [id number] DOB: 03-09-1961  Today's TOC FU Call Status: Today's TOC FU Call Status:: Unsuccessful Call (1st Attempt) Unsuccessful Call (1st Attempt) Date: 04/29/24  Attempted to reach the patient regarding the most recent Inpatient/ED visit.  Follow Up Plan: Additional outreach attempts will be made to reach the patient to complete the Transitions of Care (Post Inpatient/ED visit) call.   Medford Balboa, BSN, RN [place]  VBCI - [facility] RN Care Manager [phone]

## 2024-04-30 ENCOUNTER — Telehealth: Payer: Self-pay

## 2024-04-30 NOTE — Transitions of Care (Post Inpatient/ED Visit) (Signed)
   04/30/2024  Name: Catherine Hubbard MRN: [id number] DOB: [birth year]  Today's TOC FU Call Status: Today's TOC FU Call Status:: Unsuccessful Call (2nd Attempt) Unsuccessful Call (1st Attempt) Date: 04/29/24 Unsuccessful Call (2nd Attempt) Date: 04/30/24  Attempted to reach the patient regarding the most recent Inpatient/ED visit.  Follow Up Plan: Additional outreach attempts will be made to reach the patient to complete the Transitions of Care (Post Inpatient/ED visit) call.   Medford Balboa, BSN, RN [place]  VBCI - [facility] RN Care Manager [phone]

## 2024-05-01 ENCOUNTER — Telehealth: Payer: Self-pay

## 2024-05-01 NOTE — Transitions of Care (Post Inpatient/ED Visit) (Signed)
 05/01/2024  Name: Catherine Hubbard MRN: [id number] DOB: Sep 01, 1960  Today's TOC FU Call Status: Today's TOC FU Call Status:: Successful TOC FU Call Completed TOC FU Call Complete Date: 05/01/24 Patient's Name and Date of Birth confirmed.  Transition Care Management Follow-up Telephone Call Date of Discharge: 04/28/24 [facility] Type of Discharge: Inpatient Admission Primary Inpatient Discharge Diagnosis:: Fall; Fractured ribs How have you been since you were released from the hospital?: Same Any questions or concerns?: No  Items Reviewed: Did you receive and understand the discharge instructions provided?: Yes Medications obtained,verified, and reconciled?: Yes (Medications Reviewed) Any new allergies since your discharge?: No Dietary orders reviewed?: Yes Type of Diet Ordered:: Heart Healthy Do you have support at home?: Yes People in Home [RPT]: spouse Name of Support/Comfort Primary Source: Luke [place]  Medications Reviewed Today: Medications Reviewed Today     Reviewed by Moises Reusing, RN (Case Manager) on 05/01/24 at 1015  Med List Status: <None>   Medication Order Taking? Sig Documenting Provider Last Dose Status Informant  acetaminophen  (TYLENOL ) 500 MG tablet [id number]  Take 2 tablets (1,000 mg total) by mouth every 6 (six) hours as needed for mild pain (pain score 1-3). Doug Lyle LABOR, PA-C  Active Self, Pharmacy Records  acetaminophen  (TYLENOL ) 500 MG tablet [id number]  Take 2 tablets (1,000 mg total) by mouth every 6 (six) hours. Vicci Burnard SAUNDERS, PA-C  Active   albuterol  (VENTOLIN  HFA) 108 (90 Base) MCG/ACT inhaler [id number]  TAKE 2 PUFFS BY MOUTH EVERY 6 HOURS AS NEEDED FOR WHEEZE OR SHORTNESS OF BREATH Saguier, [place], PA-C  Active Self, Pharmacy Records  B Complex Vitamins (VITAMIN B COMPLEX PO) [id number]  Take 1 tablet by mouth daily. 1 daily [provider]  Active Self, Pharmacy Records  baclofen  (LIORESAL ) 10 MG  tablet [id number]  Take 1 tablet (10 mg total) by mouth 3 (three) times daily as needed for muscle spasms. Vicci Burnard SAUNDERS, PA-C  Active   bismuth  subsalicylate (PEPTO BISMOL) 262 MG/15ML suspension [id number]  Take 30 mLs by mouth every 4 (four) hours as needed for indigestion or diarrhea or loose stools (acid reflux). [provider]  Active Self, Pharmacy Records  budesonide -formoterol  (SYMBICORT ) 160-4.5 MCG/ACT inhaler [id number]  Inhale 2 puffs into the lungs 2 (two) times daily.  Patient taking differently: Inhale 2 puffs into the lungs 2 (two) times daily as needed (Asthma).   Saguier, [place], PA-C  Active Self, Pharmacy Records  busPIRone  (BUSPAR ) 7.5 MG tablet [id number]  TAKE 2 TABLETS (15 MG TOTAL) BY MOUTH 2 (TWO) TIMES DAILY. Saguier, [place], PA-C  Active Self, Pharmacy Records  Calcium  Carbonate Antacid (TUMS PO) [id number]  Take 2 tablets by mouth 2 (two) times daily as needed (acid reflux). [provider]  Active Self, Pharmacy Records  Cholecalciferol (D2000 ULTRA STRENGTH) 50 MCG (2000 UT) CAPS [id number]  Take 4,000 Units by mouth daily. [provider]  Active Self, Pharmacy Records  docusate sodium  (COLACE) 100 MG capsule [id number]  Take 1 capsule (100 mg total) by mouth daily as needed for mild constipation. Vicci Burnard SAUNDERS, PA-C  Active   fluticasone  (FLONASE ) 50 MCG/ACT nasal spray [id number]  SPRAY 2 SPRAYS INTO EACH NOSTRIL EVERY DAY  Patient taking differently: Place 2 sprays into both nostrils 2 (two) times daily as needed for allergies or rhinitis.   Saguier, [place], PA-C  Active Self, Pharmacy Records  gabapentin  (NEURONTIN ) 100 MG capsule [id number]  Take 2 capsules (200 mg total) by mouth 3 (three) times  daily. Vicci Burnard SAUNDERS, PA-C  Active   lidocaine  (LIDODERM ) 5 % [id number]  Place 1 patch onto the skin daily. Remove & Discard patch within 12 hours or as directed by MD Vicci Burnard SAUNDERS, PA-C  Active   modafinil  (PROVIGIL ) 200 MG tablet [id number]   TAKE 1 TABLET BY MOUTH IN THE MORNING AND 1/2 TABLET IN THE EVENING. CALL [phone] TO SCHEDULE FOLLOW UP AROUND 02/10/24 FOR ONGOING REFILLS.  Patient taking differently: Take 100-200 mg by mouth 2 (two) times daily as needed (Hypersomnia).   Sater, Charlie LABOR, MD  Active Self, Pharmacy Records  Multiple Vitamin (MULTIVITAMIN WITH MINERALS) TABS tablet [id number]  Take 1 tablet by mouth daily. [provider]  Active Self, Pharmacy Records  nystatin  cream (MYCOSTATIN ) [id number]  Apply 1 Application topically 2 (two) times daily.  Patient taking differently: Apply 1 Application topically 2 (two) times daily as needed for dry skin.   Saguier, [place], PA-C  Active Self, Pharmacy Records  olmesartan  (BENICAR ) 5 MG tablet [id number]  TAKE 2 TABLETS BY MOUTH EVERY DAY  Patient taking differently: Take 5 mg by mouth daily.   Saguier, [place], PA-C  Active Self, Pharmacy Records  oxybutynin  (DITROPAN ) 5 MG tablet [id number]  TAKE 1 TABLET BY MOUTH TWICE A DAY  Patient taking differently: Take 2.5-5 mg by mouth 2 (two) times daily as needed for bladder spasms.   Sater, Charlie LABOR, MD  Active Self, Pharmacy Records  oxyCODONE  (OXY IR/ROXICODONE ) 5 MG immediate release tablet [id number]  Take 1-2 tablets (5-10 mg total) by mouth every 4 (four) hours as needed for moderate pain (pain score 4-6). Vicci Burnard SAUNDERS, PA-C  Active   pantoprazole  (PROTONIX ) 40 MG tablet [id number]  TAKE 1 TABLET BY MOUTH EVERY DAY Saguier, [place] PA-C  Active Self, Pharmacy Records           Med Note JACKOLYN WADDELL VEAR [place] Apr 26, 2024 11:42 AM) Has not been taking, but did take a dose yesterday.  Probiotic Product (PROBIOTIC DAILY PO) [id number]  Take 1 tablet by mouth 3 (three) times a week. [provider]  Active Self, Pharmacy Records            Home Care and Equipment/Supplies: Were Home Health Services Ordered?: No (Outpatient rehab) Any new equipment or medical supplies ordered?: Yes (RW, Tub  bench) Name of Medical supply agency?: [employer] Were you able to get the equipment/medical supplies?: Yes Do you have any questions related to the use of the equipment/supplies?: No  Functional Questionnaire: Do you need assistance with bathing/showering or dressing?: No Do you need assistance with meal preparation?: No Do you need assistance with eating?: No Do you have difficulty maintaining continence: No Do you need assistance with getting out of bed/getting out of a chair/moving?: No Do you have difficulty managing or taking your medications?: No  Follow up appointments reviewed: PCP Follow-up appointment confirmed?: Yes Date of PCP follow-up appointment?: 05/06/24 Follow-up Provider: [facility] Follow-up appointment confirmed?: NA Do you need transportation to your follow-up appointment?: No Do you understand care options if your condition(s) worsen?: Yes-patient verbalized understanding  SDOH Interventions Today    Flowsheet Row Most Recent Value  SDOH Interventions   Food Insecurity Interventions Intervention Not Indicated  Housing Interventions Intervention Not Indicated  Transportation Interventions Intervention Not Indicated  Utilities Interventions Intervention Not Indicated    Goals Addressed             This Visit's Progress    VBCI  Transitions of Care (TOC) Care Plan       Problems:  Recent Hospitalization for treatment of Falls, Rib Fractures, MS Hospital or ED Adm Risk is 40%  Goal:  Over the next 30 days, the patient will not experience hospital readmission  Interventions:   Falls Interventions: Provided written and verbal education re: potential causes of falls and Fall prevention strategies Reviewed medications and discussed potential side effects of medications such as dizziness and frequent urination Advised patient of importance of notifying provider of falls Assessed for falls since last encounter Assessed patients  knowledge of fall risk prevention secondary to previously provided education Screening for signs and symptoms of depression related to chronic disease state  Referral for Outpatient PT Pain medications for rib fractures - try non narcotic interventions first Incentive spirometer multiple times daily Use assistive device when ambulating Activity as tolerated   Patient Self Care Activities:  Attend all scheduled provider appointments Call pharmacy for medication refills 3-7 days in advance of running out of medications Call provider office for new concerns or questions  Notify RN Care Manager of [facility] call rescheduling needs Participate in Transition of Care Program/Attend [facility] scheduled calls Perform all self care activities independently  Take medications as prescribed    Plan:  Telephone follow up appointment with care management team member scheduled for:  Friday September 19th at 10:30am       Discussed and offered 30 day TOC program. The patient has been provided with contact information for the care management team and has been advised to call with any health -related questions or concerns.  The patient verbalized understanding with current plan of care.  The patient is directed to their insurance card regarding availability of benefits coverage.    Medford Balboa, BSN, RN [place]  VBCI - [facility] RN Care Manager [phone]

## 2024-05-01 NOTE — Patient Instructions (Signed)
 Visit Information  Thank you for taking time to visit with me today. Please don't hesitate to contact me if I can be of assistance to you before our next scheduled telephone appointment.  Our next appointment is by telephone on Friday September 19th at 10:30am  Following is a copy of your care plan:   Goals Addressed             This Visit's Progress    VBCI Transitions of Care (TOC) Care Plan       Problems:  Recent Hospitalization for treatment of Falls, Rib Fractures, MS Hospital or ED Adm Risk is 40%  Goal:  Over the next 30 days, the patient will not experience hospital readmission  Interventions:   Falls Interventions: Provided written and verbal education re: potential causes of falls and Fall prevention strategies Reviewed medications and discussed potential side effects of medications such as dizziness and frequent urination Advised patient of importance of notifying provider of falls Assessed for falls since last encounter Assessed patients knowledge of fall risk prevention secondary to previously provided education Screening for signs and symptoms of depression related to chronic disease state  Referral for Outpatient PT Pain medications for rib fractures - try non narcotic interventions first Incentive spirometer multiple times daily Use assistive device when ambulating Activity as tolerated   Patient Self Care Activities:  Attend all scheduled provider appointments Call pharmacy for medication refills 3-7 days in advance of running out of medications Call provider office for new concerns or questions  Notify RN Care Manager of [facility] call rescheduling needs Participate in Transition of Care Program/Attend [facility] scheduled calls Perform all self care activities independently  Take medications as prescribed    Plan:  Telephone follow up appointment with care management team member scheduled for:  Friday September 19th at 10:30am        Patient verbalizes  understanding of instructions and care plan provided today and agrees to view in MyChart. Active MyChart status and patient understanding of how to access instructions and care plan via MyChart confirmed with patient.     Telephone follow up appointment with care management team member scheduled for:  Please call the care guide team at [phone] if you need to cancel or reschedule your appointment.   Please call the Suicide and Crisis Lifeline: 988 call the [place]  National Suicide Prevention Lifeline: 416-476-8526 or TTY: 2672707272 TTY 281-403-8720) to talk to a trained counselor if you are experiencing a Mental Health or Behavioral Health Crisis or need someone to talk to.  Medford Balboa, BSN, RN [place]  VBCI - [facility] RN Care Manager [phone]

## 2024-05-05 ENCOUNTER — Ambulatory Visit: Admitting: Physical Medicine & Rehabilitation

## 2024-05-06 ENCOUNTER — Ambulatory Visit: Admitting: Medical

## 2024-05-06 ENCOUNTER — Other Ambulatory Visit: Payer: Self-pay

## 2024-05-06 ENCOUNTER — Encounter: Payer: Self-pay | Admitting: Medical

## 2024-05-06 VITALS — BP 110/60 | HR 82 | Temp 98.0°F | Resp 17 | Ht 63.0 in | Wt 153.6 lb

## 2024-05-06 DIAGNOSIS — G35 Multiple sclerosis: Secondary | ICD-10-CM | POA: Diagnosis not present

## 2024-05-06 DIAGNOSIS — R7989 Other specified abnormal findings of blood chemistry: Secondary | ICD-10-CM | POA: Diagnosis not present

## 2024-05-06 DIAGNOSIS — R2681 Unsteadiness on feet: Secondary | ICD-10-CM | POA: Diagnosis not present

## 2024-05-06 DIAGNOSIS — Z23 Encounter for immunization: Secondary | ICD-10-CM | POA: Diagnosis not present

## 2024-05-06 DIAGNOSIS — Z9181 History of falling: Secondary | ICD-10-CM

## 2024-05-06 DIAGNOSIS — K219 Gastro-esophageal reflux disease without esophagitis: Secondary | ICD-10-CM | POA: Diagnosis not present

## 2024-05-06 DIAGNOSIS — E871 Hypo-osmolality and hyponatremia: Secondary | ICD-10-CM | POA: Diagnosis not present

## 2024-05-06 DIAGNOSIS — N83201 Unspecified ovarian cyst, right side: Secondary | ICD-10-CM

## 2024-05-06 DIAGNOSIS — S2249XS Multiple fractures of ribs, unspecified side, sequela: Secondary | ICD-10-CM

## 2024-05-06 MED ORDER — HYDROCODONE-ACETAMINOPHEN 5-325 MG PO TABS: 1.0000 | ORAL_TABLET | Freq: Four times a day (QID) | ORAL | 0 refills | Status: DC | PRN

## 2024-05-06 MED ORDER — PANTOPRAZOLE SODIUM 40 MG PO TBEC: 40.0000 mg | DELAYED_RELEASE_TABLET | Freq: Every day | ORAL | 3 refills | Status: AC

## 2024-05-06 NOTE — Patient Instructions (Signed)
 Right rib fractures, acute pain management Acute fractures of the 9th, 10th, and 11th ribs causing significant pain.  - Prescribed hydrocodone  with acetaminophen , 20 tablets,1 tab every 6 hours as needed for pain. - Advised against oxycodone  due to higher sedation and constipation risk. - Recommended alternating hydrocodone  with acetaminophen  and plain acetaminophen . Explained in detail) - Ordered occupational therapy home health evaluation for safety equipment.  Multiple sclerosis with gait instability and fall risk Multiple sclerosis with gait instability and increased fall risk. Recent falls led to rib fractures, necessitating careful medication management. - Continue baclofen  and gabapentin . - Coordinate with neurologist for ongoing management. - Ordered occupational therapy home health evaluation for safety equipment.  Hyponatremia Hyponatremia noted during recent hospitalization. - Order repeat sodium level and complete blood count (CBC).  Hypertension Hypertension controlled with reduced olmesartan  dose. Home blood pressure 110-130/60. - Continue olmesartan  5 mg once daily. - Monitor blood pressure. Consider discontinuing olmesartan  if systolic consistently below 110.  Gastroesophageal reflux disease (GERD) GERD symptoms controlled with pantoprazole . Insurance requires prior authorization. - Refill pantoprazole  and address insurance prior authorization if needed.  Wheezing -well controlled recently with  albuterol  as needed. No recent wheezing. - Continue albuterol  as needed.  Follow up date to be determined after lab review.

## 2024-05-06 NOTE — Progress Notes (Signed)
 Subjective:    Patient ID: Catherine Hubbard, female    DOB: 12/02/1960, 63 y.o.   MRN: [id number]  HPI Pt her for follow up. Sh had admission for fall.  04-26-2024 HPI: 63yo F with PMHx MS and other issues as below was walking at home when she tripped and fell on to her R side. No LOC and she did not hit her head. She had a lot of pain in her ribs so she went to [facility] for eval. She was found to have 3 R rib FXs and was having a lot of pain. I accepted her for admission.    Assessment/Plan 62yo F S/P GLF    MS and mobility issues at baseline   Admit for multimodal pain control, PT/OT    Discharge Diagnoses Ground level fall Right 9-11 rib fractures Hyponatremia, resolved   Consultants None    Procedures None    HPI: Patient is a 63 year old female with PMH significant for MS who presented to [facility] ED s/p fall. She was ambulating with a cane and tripped and fell on her right side. Denied LOC or striking head. Complained of pain in right chest wall and found to have rib fractures. Admitted to trauma for pain control and therapies.    Hospital Course: Patient evaluated by therapies and recommended for outpatient PT and a rolling walker, which have been arranged. Hospital course complicated by hyponatremia which resolved with salt tabs. On 04/28/24 patient was stable for discharge home with her husband and follow up with PCP for pain control as outlined below.    I or a member of my team have reviewed this patient in the Controlled Substance Database   I or a member of my team have reviewed this patient in the Controlled Substance Database    STOP taking these medications     HYDROcodone -acetaminophen  5-325 MG tablet Commonly known as: Norco     Iron  (Ferrous Sulfate ) 325 (65 Fe) MG Tabs           TAKE these medications     acetaminophen  500 MG tablet Commonly known as: TYLENOL  Take 2 tablets (1,000 mg total) by mouth every 6 (six) hours as  needed for mild pain (pain score 1-3). What changed: Another medication with the same name was added. Make sure you understand how and when to take each.    acetaminophen  500 MG tablet Commonly known as: TYLENOL  Take 2 tablets (1,000 mg total) by mouth every 6 (six) hours. What changed: You were already taking a medication with the same name, and this prescription was added. Make sure you understand how and when to take each.    albuterol  108 (90 Base) MCG/ACT inhaler Commonly known as: VENTOLIN  HFA TAKE 2 PUFFS BY MOUTH EVERY 6 HOURS AS NEEDED FOR WHEEZE OR SHORTNESS OF BREATH What changed: Another medication with the same name was removed. Continue taking this medication, and follow the directions you see here.    baclofen  10 MG tablet Commonly known as: LIORESAL  Take 1 tablet (10 mg total) by mouth 3 (three) times daily as needed for muscle spasms. What changed: See the new instructions.    bismuth  subsalicylate 262 MG/15ML suspension Commonly known as: PEPTO BISMOL Take 30 mLs by mouth every 4 (four) hours as needed for indigestion or diarrhea or loose stools (acid reflux).    budesonide -formoterol  160-4.5 MCG/ACT inhaler Commonly known as: SYMBICORT  Inhale 2 puffs into the lungs 2 (two) times daily. What changed:  when to take this reasons to take this    busPIRone  7.5 MG tablet Commonly known as: BUSPAR  TAKE 2 TABLETS (15 MG TOTAL) BY MOUTH 2 (TWO) TIMES DAILY.    D2000 Ultra Strength 50 MCG (2000 UT) Caps Generic drug: Cholecalciferol Take 4,000 Units by mouth daily.    docusate sodium  100 MG capsule Commonly known as: COLACE Take 1 capsule (100 mg total) by mouth daily as needed for mild constipation.    fluticasone  50 MCG/ACT nasal spray Commonly known as: FLONASE  SPRAY 2 SPRAYS INTO EACH NOSTRIL EVERY DAY What changed: See the new instructions.    gabapentin  100 MG capsule Commonly known as: NEURONTIN  Take 2 capsules (200 mg total) by mouth 3 (three) times  daily. What changed: See the new instructions.    lidocaine  5 % Commonly known as: LIDODERM  Place 1 patch onto the skin daily. Remove & Discard patch within 12 hours or as directed by MD    modafinil  200 MG tablet Commonly known as: PROVIGIL  TAKE 1 TABLET BY MOUTH IN THE MORNING AND 1/2 TABLET IN THE EVENING. CALL [phone] TO SCHEDULE FOLLOW UP AROUND 02/10/24 FOR ONGOING REFILLS. What changed: See the new instructions.    multivitamin with minerals Tabs tablet Take 1 tablet by mouth daily.    nystatin  cream Commonly known as: MYCOSTATIN  Apply 1 Application topically 2 (two) times daily. What changed:  when to take this reasons to take this    olmesartan  5 MG tablet Commonly known as: BENICAR  TAKE 2 TABLETS BY MOUTH EVERY DAY What changed: how much to take    omeprazole  20 MG capsule Commonly known as: PRILOSEC Take 20 mg by mouth at bedtime.    oxybutynin  5 MG tablet Commonly known as: DITROPAN  TAKE 1 TABLET BY MOUTH TWICE A DAY What changed:  how much to take when to take this reasons to take this    oxyCODONE  5 MG immediate release tablet Commonly known as: Oxy IR/ROXICODONE  Take 1-2 tablets (5-10 mg total) by mouth every 4 (four) hours as needed for moderate pain (pain score 4-6).    pantoprazole  40 MG tablet Commonly known as: PROTONIX  TAKE 1 TABLET BY MOUTH EVERY DAY    PROBIOTIC DAILY PO Take 1 tablet by mouth 3 (three) times a week.    TUMS PO Take 2 tablets by mouth 2 (two) times daily as needed (acid reflux).    VITAMIN B COMPLEX PO Take 1 tablet by mouth daily. 1 daily      Follow-up Information       Wendle Kina, PA-C. Schedule an appointment as soon as possible for a visit in 1 week(s).   Specialties: Internal Medicine, Family Medicine Why: To follow up for pain control for rib fractures upon hospital discharge. Contact information: [address] [place] [address] [phone]        Catherine Hubbard is a 63 year old female with GERD and rib fractures who presents for medication management and pain control.  She has been experiencing issues with her pantoprazole  prescription due to insurance prior authorization, although it has been effectively controlling her GERD symptoms. She has tried other medications in the past but finds pantoprazole  most effective. Stress exacerbates her symptoms, and she has a history of esophageal stenosis requiring dilation, which improved her condition until recent emotional stressors.  She sustained rib fractures on the right side, specifically the ninth, tenth, and eleventh ribs, from a fall. She was hospitalized from September 7th to  September 9th. During hospitalization, she received physical therapy and was discharged with a shower bench that was incomplete and unsuitable for her needs. For pain management, she uses hydrocodone  and Tylenol , taking half doses to manage pain without excessive sedation. She avoids oxycodone  due to its stronger sedative effects.  She also uses gabapentin  and baclofen  for her MS, prescribed by her neurologist. Her MS contributes to gait instability and falls.  She uses albuterol  and Symbicort  as needed for respiratory symptoms, with no recent wheezing reported. Her blood pressure is managed with olmesartan , which she has reduced to one tablet daily due to lower readings at home.     Review of Systems  Constitutional:  Negative for chills, fatigue and fever.  Respiratory:  Negative for cough, chest tightness and wheezing.   Cardiovascular:  Negative for chest pain and palpitations.  Gastrointestinal:  Negative for abdominal pain, blood in stool and constipation.  Genitourinary:  Negative for dysuria.  Musculoskeletal:  Negative for back pain, joint swelling and neck pain.       Rib pain  Skin:  Negative for rash.  Neurological:  Negative for dizziness, seizures and weakness.  Hematological:  Negative for  adenopathy. Does not bruise/bleed easily.  Psychiatric/Behavioral:  Negative for behavioral problems, decreased concentration and hallucinations.     Past Medical History:  Diagnosis Date   Alcohol abuse    Asthma    Esophagitis    GERD (gastroesophageal reflux disease)    Headache    Hearing loss    Hypertension    Multiple sclerosis (HCC)    Pancreatitis    Vision abnormalities      Social History   Socioeconomic History   Marital status: Married    Spouse name: Not on file   Number of children: Not on file   Years of education: Not on file   Highest education level: Not on file  Occupational History   Not on file  Tobacco Use   Smoking status: Never   Smokeless tobacco: Never   Tobacco comments:    second hand smoker when young. Heavy exposure.  Vaping Use   Vaping status: Never Used  Substance and Sexual Activity   Alcohol use: Yes    Alcohol/week: 6.0 standard drinks of alcohol    Types: 6 Cans of beer per week    Comment: daily, couple of shots ( weekly depending on day)   Drug use: No   Sexual activity: Not Currently  Other Topics Concern   Not on file  Social History Narrative   Not on file   Social Drivers of Health   Financial Resource Strain: Not on file  Food Insecurity: No Food Insecurity (05/01/2024)   Hunger Vital Sign    Worried About Running Out of Food in the Last Year: Never true    Ran Out of Food in the Last Year: Never true  Transportation Needs: No Transportation Needs (05/01/2024)   PRAPARE - Administrator, Civil Service (Medical): No    Lack of Transportation (Non-Medical): No  Physical Activity: Not on file  Stress: Not on file  Social Connections: Not on file  Intimate Partner Violence: Not At Risk (05/01/2024)   Humiliation, Afraid, Rape, and Kick questionnaire    Fear of Current or Ex-Partner: No    Emotionally Abused: No    Physically Abused: No    Sexually Abused: No    Past Surgical History:  Procedure  Laterality Date   ANKLE FRACTURE SURGERY Bilateral  CARPAL TUNNEL RELEASE Bilateral    CHOLECYSTECTOMY     COLONOSCOPY  11/2016   multiple   ESOPHAGOGASTRODUODENOSCOPY  11/2016   ESOPHAGOGASTRODUODENOSCOPY N/A 02/01/2017   Procedure: ESOPHAGOGASTRODUODENOSCOPY (EGD);  Surgeon: Avram Lupita BRAVO, MD;  Location[facility] ENDOSCOPY;  Service: Endoscopy;  Laterality: N/A;   ESOPHAGOGASTRODUODENOSCOPY (EGD) WITH PROPOFOL  N/A 02/04/2017   Procedure: ESOPHAGOGASTRODUODENOSCOPY (EGD) WITH PROPOFOL ;  Surgeon: Aneita Gwendlyn DASEN, MD;  Location: [facility] ENDOSCOPY;  Service: Endoscopy;  Laterality: N/A;   LAPAROSCOPIC GASTRIC SLEEVE RESECTION  2011   ULNAR NERVE REPAIR Bilateral     Family History  Problem Relation Age of Onset   Cancer Mother    Stroke Mother    Cancer Father     Allergies  Allergen Reactions   Ibuprofen  Other (See Comments)    Cannot take due to gastric bypass   Nsaids Other (See Comments)    Can not take due to Gastric bypass    Current Outpatient Medications on File Prior to Visit  Medication Sig Dispense Refill   acetaminophen  (TYLENOL ) 500 MG tablet Take 2 tablets (1,000 mg total) by mouth every 6 (six) hours as needed for mild pain (pain score 1-3).     acetaminophen  (TYLENOL ) 500 MG tablet Take 2 tablets (1,000 mg total) by mouth every 6 (six) hours. 30 tablet 0   albuterol  (VENTOLIN  HFA) 108 (90 Base) MCG/ACT inhaler TAKE 2 PUFFS BY MOUTH EVERY 6 HOURS AS NEEDED FOR WHEEZE OR SHORTNESS OF BREATH 18 each 1   B Complex Vitamins (VITAMIN B COMPLEX PO) Take 1 tablet by mouth daily. 1 daily     baclofen  (LIORESAL ) 10 MG tablet Take 1 tablet (10 mg total) by mouth 3 (three) times daily as needed for muscle spasms. 30 tablet 0   bismuth  subsalicylate (PEPTO BISMOL) 262 MG/15ML suspension Take 30 mLs by mouth every 4 (four) hours as needed for indigestion or diarrhea or loose stools (acid reflux).     budesonide -formoterol  (SYMBICORT ) 160-4.5 MCG/ACT inhaler Inhale 2 puffs into the lungs  2 (two) times daily. (Patient taking differently: Inhale 2 puffs into the lungs 2 (two) times daily as needed (Asthma).) 1 each 3   busPIRone  (BUSPAR ) 7.5 MG tablet TAKE 2 TABLETS (15 MG TOTAL) BY MOUTH 2 (TWO) TIMES DAILY. 360 tablet 0   Calcium  Carbonate Antacid (TUMS PO) Take 2 tablets by mouth 2 (two) times daily as needed (acid reflux).     Cholecalciferol (D2000 ULTRA STRENGTH) 50 MCG (2000 UT) CAPS Take 4,000 Units by mouth daily.     fluticasone  (FLONASE ) 50 MCG/ACT nasal spray SPRAY 2 SPRAYS INTO EACH NOSTRIL EVERY DAY (Patient taking differently: Place 2 sprays into both nostrils 2 (two) times daily as needed for allergies or rhinitis.) 48 mL 3   gabapentin  (NEURONTIN ) 100 MG capsule Take 2 capsules (200 mg total) by mouth 3 (three) times daily. 30 capsule 0   lidocaine  (LIDODERM ) 5 % Place 1 patch onto the skin daily. Remove & Discard patch within 12 hours or as directed by MD 30 patch 0   modafinil  (PROVIGIL ) 200 MG tablet TAKE 1 TABLET BY MOUTH IN THE MORNING AND 1/2 TABLET IN THE EVENING. CALL [phone] TO SCHEDULE FOLLOW UP AROUND 02/10/24 FOR ONGOING REFILLS. (Patient taking differently: Take 100-200 mg by mouth 2 (two) times daily as needed (Hypersomnia).) 45 tablet 2   Multiple Vitamin (MULTIVITAMIN WITH MINERALS) TABS tablet Take 1 tablet by mouth daily.     nystatin  cream (MYCOSTATIN ) Apply 1 Application topically 2 (two) times daily. (  Patient taking differently: Apply 1 Application topically 2 (two) times daily as needed for dry skin.) 30 g 1   olmesartan  (BENICAR ) 5 MG tablet TAKE 2 TABLETS BY MOUTH EVERY DAY (Patient taking differently: Take 5 mg by mouth daily.) 180 tablet 3   oxybutynin  (DITROPAN ) 5 MG tablet TAKE 1 TABLET BY MOUTH TWICE A DAY (Patient taking differently: Take 2.5-5 mg by mouth 2 (two) times daily as needed for bladder spasms.) 180 tablet 0   oxyCODONE  (OXY IR/ROXICODONE ) 5 MG immediate release tablet Take 1-2 tablets (5-10 mg total) by mouth every 4 (four)  hours as needed for moderate pain (pain score 4-6). 30 tablet 0   Probiotic Product (PROBIOTIC DAILY PO) Take 1 tablet by mouth 3 (three) times a week.     docusate sodium  (COLACE) 100 MG capsule Take 1 capsule (100 mg total) by mouth daily as needed for mild constipation. (Patient not taking: Reported on 05/06/2024)     No current facility-administered medications on file prior to visit.    BP 110/60   Pulse 82   Temp 98 F (36.7 C) (Oral)   Resp 17   Ht 5' 3 (1.6 m)   Wt 153 lb 9.6 oz (69.7 kg)   LMP 08/13/2014 Comment: very few periods  SpO2 97%   BMI 27.21 kg/m        Objective:   Physical Exam  General Mental Status- Alert. General Appearance- Not in acute distress.   Skin General: Color- Normal Color. Moisture- Normal Moisture.  Neck  No JVD.  Chest and Lung Exam Auscultation: Breath Sounds:-CTA  Cardiovascular Auscultation:Rythm- RRR Murmurs & Other Heart Sounds:Auscultation of the heart reveals- No Murmurs.  Abdomen Inspection:-Inspeection Normal. Palpation/Percussion:Note:No mass. Palpation and Percussion of the abdomen reveal- Non Tender, Non Distended + BS, no rebound or guarding.    Neurologic Cranial Nerve exam:- CN III-XII intact(No nystagmus), symmetric smile. Drift Test:- No drift. Romberg Exam:- Negative.  Heal to Toe Gait exam:-Normal. Finger to Nose:- Normal/Intact Strength:- 5/5 equal and symmetric strength both upper and lower extremities.   Rt lower rib- faint bruising lower ribs. Moderate tender to palpation presently.    Assessment & Plan:   Patient Instructions  Right rib fractures, acute pain management Acute fractures of the 9th, 10th, and 11th ribs causing significant pain.  - Prescribed hydrocodone  with acetaminophen , 20 tablets,1 tab every 6 hours as needed for pain. - Advised against oxycodone  due to higher sedation and constipation risk. - Recommended alternating hydrocodone  with acetaminophen  and plain acetaminophen .  Explained in detail) - Ordered occupational therapy home health evaluation for safety equipment.  Multiple sclerosis with gait instability and fall risk Multiple sclerosis with gait instability and increased fall risk. Recent falls led to rib fractures, necessitating careful medication management. - Continue baclofen  and gabapentin . - Coordinate with neurologist for ongoing management. - Ordered occupational therapy home health evaluation for safety equipment.  Hyponatremia Hyponatremia noted during recent hospitalization. - Order repeat sodium level and complete blood count (CBC).  Hypertension Hypertension controlled with reduced olmesartan  dose. Home blood pressure 110-130/60. - Continue olmesartan  5 mg once daily. - Monitor blood pressure. Consider discontinuing olmesartan  if systolic consistently below 110.  Gastroesophageal reflux disease (GERD) GERD symptoms controlled with pantoprazole . Insurance requires prior authorization. - Refill pantoprazole  and address insurance prior authorization if needed.  Wheezing -well controlled recently with  albuterol  as needed. No recent wheezing. - Continue albuterol  as needed.  Follow up date to be determined after lab review.   Donyel Nester, PA-C  I personally spent a total of 45 minutes in the care of the patient today including performing a medically appropriate exam/evaluation, counseling and educating, placing orders, and documenting clinical information in the EHR.

## 2024-05-07 ENCOUNTER — Ambulatory Visit: Payer: Self-pay | Admitting: Medical

## 2024-05-07 LAB — COMPLETE METABOLIC PANEL WITHOUT GFR
AG Ratio: 1.8 (calc) (ref 1.0–2.5)
ALT: 15 U/L (ref 6–29)
AST: 25 U/L (ref 10–35)
Albumin: 4.2 g/dL (ref 3.6–5.1)
Alkaline phosphatase (APISO): 110 U/L (ref 37–153)
BUN: 15 mg/dL (ref 7–25)
CO2: 31 mmol/L (ref 20–32)
Calcium: 10 mg/dL (ref 8.6–10.4)
Chloride: 98 mmol/L (ref 98–110)
Creat: 0.69 mg/dL (ref 0.50–1.05)
Globulin: 2.3 g/dL (ref 1.9–3.7)
Glucose, Bld: 83 mg/dL (ref 65–99)
Potassium: 4.3 mmol/L (ref 3.5–5.3)
Sodium: 138 mmol/L (ref 135–146)
Total Bilirubin: 0.4 mg/dL (ref 0.2–1.2)
Total Protein: 6.5 g/dL (ref 6.1–8.1)

## 2024-05-07 LAB — CBC WITH DIFFERENTIAL/PLATELET
Basophils Absolute: 0.1 K/uL (ref 0.0–0.1)
Basophils Relative: 1.4 % (ref 0.0–3.0)
Eosinophils Absolute: 0.7 K/uL (ref 0.0–0.7)
Eosinophils Relative: 8 % — ABNORMAL HIGH (ref 0.0–5.0)
HCT: 38.3 % (ref 36.0–46.0)
Hemoglobin: 12.9 g/dL (ref 12.0–15.0)
Lymphocytes Relative: 22.9 % (ref 12.0–46.0)
Lymphs Abs: 1.9 K/uL (ref 0.7–4.0)
MCHC: 33.7 g/dL (ref 30.0–36.0)
MCV: 97.8 fl (ref 78.0–100.0)
Monocytes Absolute: 0.9 K/uL (ref 0.1–1.0)
Monocytes Relative: 11.4 % (ref 3.0–12.0)
Neutro Abs: 4.7 K/uL (ref 1.4–7.7)
Neutrophils Relative %: 56.3 % (ref 43.0–77.0)
Platelets: 457 K/uL — ABNORMAL HIGH (ref 150.0–400.0)
RBC: 3.92 Mil/uL (ref 3.87–5.11)
RDW: 12.5 % (ref 11.5–15.5)
WBC: 8.4 K/uL (ref 4.0–10.5)

## 2024-05-08 ENCOUNTER — Telehealth: Payer: Self-pay

## 2024-05-11 ENCOUNTER — Encounter: Payer: Self-pay | Admitting: Medical

## 2024-05-12 NOTE — Addendum Note (Signed)
 Addended by: DORINA DALLAS HERO on: 05/12/2024 10:31 PM   Modules accepted: Orders

## 2024-05-14 ENCOUNTER — Telehealth: Payer: Self-pay | Admitting: Family Medicine

## 2024-05-14 NOTE — Telephone Encounter (Signed)
 MYC cxl

## 2024-05-16 ENCOUNTER — Ambulatory Visit (HOSPITAL_BASED_OUTPATIENT_CLINIC_OR_DEPARTMENT_OTHER)
Admission: RE | Admit: 2024-05-16 | Discharge: 2024-05-16 | Disposition: A | Source: Ambulatory Visit | Attending: Medical | Admitting: Medical

## 2024-05-16 DIAGNOSIS — N83201 Unspecified ovarian cyst, right side: Secondary | ICD-10-CM | POA: Insufficient documentation

## 2024-05-16 DIAGNOSIS — N83202 Unspecified ovarian cyst, left side: Secondary | ICD-10-CM | POA: Diagnosis not present

## 2024-05-16 DIAGNOSIS — N83291 Other ovarian cyst, right side: Secondary | ICD-10-CM | POA: Diagnosis not present

## 2024-05-16 DIAGNOSIS — N83292 Other ovarian cyst, left side: Secondary | ICD-10-CM | POA: Diagnosis not present

## 2024-05-18 DIAGNOSIS — G35 Multiple sclerosis: Secondary | ICD-10-CM | POA: Diagnosis not present

## 2024-05-19 ENCOUNTER — Ambulatory Visit: Admitting: Physical Medicine & Rehabilitation

## 2024-05-22 ENCOUNTER — Other Ambulatory Visit: Payer: Self-pay | Admitting: Family

## 2024-05-22 DIAGNOSIS — D259 Leiomyoma of uterus, unspecified: Secondary | ICD-10-CM

## 2024-05-22 DIAGNOSIS — N83201 Unspecified ovarian cyst, right side: Secondary | ICD-10-CM

## 2024-05-22 MED ORDER — HYDROCODONE-ACETAMINOPHEN 5-325 MG PO TABS: 1.0000 | ORAL_TABLET | Freq: Four times a day (QID) | ORAL | 0 refills | Status: AC | PRN

## 2024-05-26 ENCOUNTER — Ambulatory Visit: Admitting: Physical Medicine & Rehabilitation

## 2024-06-02 ENCOUNTER — Ambulatory Visit: Admitting: Physical Medicine & Rehabilitation

## 2024-06-02 ENCOUNTER — Other Ambulatory Visit: Payer: Self-pay | Admitting: Medical

## 2024-06-02 DIAGNOSIS — Z9884 Bariatric surgery status: Secondary | ICD-10-CM | POA: Diagnosis not present

## 2024-06-02 DIAGNOSIS — Z974 Presence of external hearing-aid: Secondary | ICD-10-CM | POA: Diagnosis not present

## 2024-06-02 DIAGNOSIS — M1990 Unspecified osteoarthritis, unspecified site: Secondary | ICD-10-CM | POA: Diagnosis not present

## 2024-06-02 DIAGNOSIS — Z9049 Acquired absence of other specified parts of digestive tract: Secondary | ICD-10-CM | POA: Diagnosis not present

## 2024-06-02 DIAGNOSIS — J45909 Unspecified asthma, uncomplicated: Secondary | ICD-10-CM | POA: Diagnosis not present

## 2024-06-02 DIAGNOSIS — Z87440 Personal history of urinary (tract) infections: Secondary | ICD-10-CM | POA: Diagnosis not present

## 2024-06-02 DIAGNOSIS — G4700 Insomnia, unspecified: Secondary | ICD-10-CM | POA: Diagnosis not present

## 2024-06-02 DIAGNOSIS — G35A Relapsing-remitting multiple sclerosis: Secondary | ICD-10-CM | POA: Diagnosis not present

## 2024-06-02 DIAGNOSIS — F419 Anxiety disorder, unspecified: Secondary | ICD-10-CM | POA: Diagnosis not present

## 2024-06-02 DIAGNOSIS — R1310 Dysphagia, unspecified: Secondary | ICD-10-CM | POA: Diagnosis not present

## 2024-06-02 DIAGNOSIS — I10 Essential (primary) hypertension: Secondary | ICD-10-CM | POA: Diagnosis not present

## 2024-06-02 DIAGNOSIS — F1010 Alcohol abuse, uncomplicated: Secondary | ICD-10-CM | POA: Diagnosis not present

## 2024-06-02 DIAGNOSIS — S2241XD Multiple fractures of ribs, right side, subsequent encounter for fracture with routine healing: Secondary | ICD-10-CM | POA: Diagnosis not present

## 2024-06-02 DIAGNOSIS — G2581 Restless legs syndrome: Secondary | ICD-10-CM | POA: Diagnosis not present

## 2024-06-02 DIAGNOSIS — D509 Iron deficiency anemia, unspecified: Secondary | ICD-10-CM | POA: Diagnosis not present

## 2024-06-02 DIAGNOSIS — K222 Esophageal obstruction: Secondary | ICD-10-CM | POA: Diagnosis not present

## 2024-06-02 DIAGNOSIS — Z9181 History of falling: Secondary | ICD-10-CM | POA: Diagnosis not present

## 2024-06-02 DIAGNOSIS — H9193 Unspecified hearing loss, bilateral: Secondary | ICD-10-CM | POA: Diagnosis not present

## 2024-06-02 DIAGNOSIS — F32A Depression, unspecified: Secondary | ICD-10-CM | POA: Diagnosis not present

## 2024-06-02 DIAGNOSIS — K219 Gastro-esophageal reflux disease without esophagitis: Secondary | ICD-10-CM | POA: Diagnosis not present

## 2024-06-02 NOTE — Telephone Encounter (Signed)
 Copied from CRM [id number]. Topic: Clinical - Prescription Issue >> Jun 02, 2024  8:50 AM Eva FALCON wrote: Reason for CRM: Pt is hoping her medication Albuterol  could be sent as soon as possible. It looks like we just received it from the Pharmacy. But was wanting me to forward another message in hopes to get it to the pharmacy faster.

## 2024-06-02 NOTE — Telephone Encounter (Signed)
No longer on med list. Please advise.  

## 2024-06-02 NOTE — Telephone Encounter (Signed)
 Rx sent to pt pharmacy

## 2024-06-04 ENCOUNTER — Ambulatory Visit

## 2024-06-08 DIAGNOSIS — K222 Esophageal obstruction: Secondary | ICD-10-CM | POA: Diagnosis not present

## 2024-06-08 DIAGNOSIS — G4700 Insomnia, unspecified: Secondary | ICD-10-CM | POA: Diagnosis not present

## 2024-06-08 DIAGNOSIS — I10 Essential (primary) hypertension: Secondary | ICD-10-CM | POA: Diagnosis not present

## 2024-06-08 DIAGNOSIS — Z9049 Acquired absence of other specified parts of digestive tract: Secondary | ICD-10-CM | POA: Diagnosis not present

## 2024-06-08 DIAGNOSIS — F32A Depression, unspecified: Secondary | ICD-10-CM | POA: Diagnosis not present

## 2024-06-08 DIAGNOSIS — F1010 Alcohol abuse, uncomplicated: Secondary | ICD-10-CM | POA: Diagnosis not present

## 2024-06-08 DIAGNOSIS — M1990 Unspecified osteoarthritis, unspecified site: Secondary | ICD-10-CM | POA: Diagnosis not present

## 2024-06-08 DIAGNOSIS — J45909 Unspecified asthma, uncomplicated: Secondary | ICD-10-CM | POA: Diagnosis not present

## 2024-06-08 DIAGNOSIS — S2241XD Multiple fractures of ribs, right side, subsequent encounter for fracture with routine healing: Secondary | ICD-10-CM | POA: Diagnosis not present

## 2024-06-08 DIAGNOSIS — H9193 Unspecified hearing loss, bilateral: Secondary | ICD-10-CM | POA: Diagnosis not present

## 2024-06-08 DIAGNOSIS — Z9884 Bariatric surgery status: Secondary | ICD-10-CM | POA: Diagnosis not present

## 2024-06-08 DIAGNOSIS — D509 Iron deficiency anemia, unspecified: Secondary | ICD-10-CM | POA: Diagnosis not present

## 2024-06-08 DIAGNOSIS — R1310 Dysphagia, unspecified: Secondary | ICD-10-CM | POA: Diagnosis not present

## 2024-06-08 DIAGNOSIS — F419 Anxiety disorder, unspecified: Secondary | ICD-10-CM | POA: Diagnosis not present

## 2024-06-08 DIAGNOSIS — G2581 Restless legs syndrome: Secondary | ICD-10-CM | POA: Diagnosis not present

## 2024-06-08 DIAGNOSIS — Z974 Presence of external hearing-aid: Secondary | ICD-10-CM | POA: Diagnosis not present

## 2024-06-08 DIAGNOSIS — G35A Relapsing-remitting multiple sclerosis: Secondary | ICD-10-CM | POA: Diagnosis not present

## 2024-06-08 DIAGNOSIS — Z87440 Personal history of urinary (tract) infections: Secondary | ICD-10-CM | POA: Diagnosis not present

## 2024-06-08 DIAGNOSIS — Z9181 History of falling: Secondary | ICD-10-CM | POA: Diagnosis not present

## 2024-06-08 DIAGNOSIS — K219 Gastro-esophageal reflux disease without esophagitis: Secondary | ICD-10-CM | POA: Diagnosis not present

## 2024-06-09 ENCOUNTER — Telehealth: Payer: Self-pay

## 2024-06-09 ENCOUNTER — Encounter: Admitting: Physical Medicine and Rehabilitation

## 2024-06-09 NOTE — Telephone Encounter (Signed)
 Copied from CRM #[id number]. Topic: Clinical - Home Health Verbal Orders >> Jun 09, 2024  8:12 AM Frederich PARAS wrote: Caller/Agency: jeff from [facility] Callback Number: [phone]  Service Requested: Physical Therapy Frequency: 1x a week for 8 weeks working on strengthen , balance gait, and endurance  Any new concerns about the patient? No, he says everything seems ok

## 2024-06-10 NOTE — Telephone Encounter (Signed)
 Called jeff and relayed the message to him also gave him approval

## 2024-06-11 ENCOUNTER — Ambulatory Visit: Admitting: Physical Medicine & Rehabilitation

## 2024-06-15 DIAGNOSIS — Z9049 Acquired absence of other specified parts of digestive tract: Secondary | ICD-10-CM | POA: Diagnosis not present

## 2024-06-15 DIAGNOSIS — Z9884 Bariatric surgery status: Secondary | ICD-10-CM | POA: Diagnosis not present

## 2024-06-15 DIAGNOSIS — Z974 Presence of external hearing-aid: Secondary | ICD-10-CM | POA: Diagnosis not present

## 2024-06-15 DIAGNOSIS — H9193 Unspecified hearing loss, bilateral: Secondary | ICD-10-CM | POA: Diagnosis not present

## 2024-06-15 DIAGNOSIS — Z9181 History of falling: Secondary | ICD-10-CM | POA: Diagnosis not present

## 2024-06-15 DIAGNOSIS — F419 Anxiety disorder, unspecified: Secondary | ICD-10-CM | POA: Diagnosis not present

## 2024-06-15 DIAGNOSIS — J45909 Unspecified asthma, uncomplicated: Secondary | ICD-10-CM | POA: Diagnosis not present

## 2024-06-15 DIAGNOSIS — R1310 Dysphagia, unspecified: Secondary | ICD-10-CM | POA: Diagnosis not present

## 2024-06-15 DIAGNOSIS — K219 Gastro-esophageal reflux disease without esophagitis: Secondary | ICD-10-CM | POA: Diagnosis not present

## 2024-06-15 DIAGNOSIS — Z87440 Personal history of urinary (tract) infections: Secondary | ICD-10-CM | POA: Diagnosis not present

## 2024-06-15 DIAGNOSIS — D509 Iron deficiency anemia, unspecified: Secondary | ICD-10-CM | POA: Diagnosis not present

## 2024-06-15 DIAGNOSIS — G4700 Insomnia, unspecified: Secondary | ICD-10-CM | POA: Diagnosis not present

## 2024-06-15 DIAGNOSIS — G35A Relapsing-remitting multiple sclerosis: Secondary | ICD-10-CM | POA: Diagnosis not present

## 2024-06-15 DIAGNOSIS — K222 Esophageal obstruction: Secondary | ICD-10-CM | POA: Diagnosis not present

## 2024-06-15 DIAGNOSIS — S2241XD Multiple fractures of ribs, right side, subsequent encounter for fracture with routine healing: Secondary | ICD-10-CM | POA: Diagnosis not present

## 2024-06-15 DIAGNOSIS — F32A Depression, unspecified: Secondary | ICD-10-CM | POA: Diagnosis not present

## 2024-06-15 DIAGNOSIS — F1010 Alcohol abuse, uncomplicated: Secondary | ICD-10-CM | POA: Diagnosis not present

## 2024-06-15 DIAGNOSIS — G2581 Restless legs syndrome: Secondary | ICD-10-CM | POA: Diagnosis not present

## 2024-06-15 DIAGNOSIS — I10 Essential (primary) hypertension: Secondary | ICD-10-CM | POA: Diagnosis not present

## 2024-06-15 DIAGNOSIS — M1990 Unspecified osteoarthritis, unspecified site: Secondary | ICD-10-CM | POA: Diagnosis not present

## 2024-06-18 ENCOUNTER — Other Ambulatory Visit (HOSPITAL_COMMUNITY)
Admission: RE | Admit: 2024-06-18 | Discharge: 2024-06-18 | Disposition: A | Discharge status: Home / Self Care | Source: Ambulatory Visit | Attending: Obstetrics & Gynecology | Admitting: Obstetrics & Gynecology

## 2024-06-18 ENCOUNTER — Ambulatory Visit (HOSPITAL_BASED_OUTPATIENT_CLINIC_OR_DEPARTMENT_OTHER): Admitting: Obstetrics & Gynecology

## 2024-06-18 ENCOUNTER — Encounter (HOSPITAL_BASED_OUTPATIENT_CLINIC_OR_DEPARTMENT_OTHER): Payer: Self-pay | Admitting: Obstetrics & Gynecology

## 2024-06-18 VITALS — BP 130/90 | HR 72 | Wt 154.8 lb

## 2024-06-18 DIAGNOSIS — Z9884 Bariatric surgery status: Secondary | ICD-10-CM

## 2024-06-18 DIAGNOSIS — Z124 Encounter for screening for malignant neoplasm of cervix: Secondary | ICD-10-CM | POA: Insufficient documentation

## 2024-06-18 DIAGNOSIS — F1011 Alcohol abuse, in remission: Secondary | ICD-10-CM | POA: Diagnosis not present

## 2024-06-18 DIAGNOSIS — I10 Essential (primary) hypertension: Secondary | ICD-10-CM

## 2024-06-18 DIAGNOSIS — G35D Multiple sclerosis, unspecified: Secondary | ICD-10-CM | POA: Diagnosis not present

## 2024-06-18 DIAGNOSIS — S2243XA Multiple fractures of ribs, bilateral, initial encounter for closed fracture: Secondary | ICD-10-CM

## 2024-06-18 DIAGNOSIS — S2243XD Multiple fractures of ribs, bilateral, subsequent encounter for fracture with routine healing: Secondary | ICD-10-CM

## 2024-06-18 DIAGNOSIS — D4959 Neoplasm of unspecified behavior of other genitourinary organ: Secondary | ICD-10-CM

## 2024-06-18 NOTE — Progress Notes (Unsigned)
 New patient Patient name: Catherine Hubbard MRN [id number]  Date of birth: Aug 16, 1961 Chief Complaint:   RLQ pain/ovarian cyst  History of Present Illness:   Catherine Hubbard is a 63 y.o. G23P2002 Caucasian female being seen today for new patient appt due to ovarian cyst that was originally noted on CT on 9/6.  Pt has ground level fall and significant right sided pain to went to the ER.  Had 3 rib fractures on the 9th - 11th rib.  Admitted for pain control with trauma service at that time.  CT of abd/pelvis showed a simple right ovarian cyst measuring 6.7 x 6.0cm and a right sided cyst measuring 2.9 x 2.3cm.  these were seen in 2024 and are stable.  Pt was not aware they were present but has some difficulty with details of her history.  Ultrasound performed 9/27 confirmed findings.  Small 1.5 x 1.1cm fibroid noted as well.  Pt reports now she feels RLQ pain and does desire removal.  Reviewed chart and prior hx.  Significant for h/o alcohol abuse and MS.  Discussed with pt today that this does add to risks with surgery.  Drinks on average 5 beverages a week.  Reports she had a beer and a shot last night but didn't finish the shot.  Highly encouraged her to minimize this intake as much as possible. Also has hx of MS followed by Dr. Vear.  On baclofen  for spasms and Ocrevus  via infusion.  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  CT of abdomen 06/2023 did not show liver abnormalities and liver enzymes were normal 04/2024.    Also hx of LE edema.  Has seen Dr. Krasowski for evaluation.  Echo showed good EF with diastolic dysfunction.  ProBNP was elevated.  Repeat normal but then again in Jan 2025 was elevated at 120.  No cardiomyopathy.  Pt does not report any SOB.    Reviewed ER records, hospital stay notes, cardiology and neurology notes from last 12 months as well as multiple CTs, MRs and lab testing.  Patient's last menstrual period was  08/13/2014.  Last pap:  Unsure of date Last mammogram: 03/2023. Results were: normal.      06/18/2024    3:39 PM 05/06/2024    3:00 PM 01/20/2024    1:15 PM 07/09/2023   11:28 AM 05/10/2023   10:57 AM  Depression screen PHQ 2/9  Decreased Interest 0 0 0 3 0  Down, Depressed, Hopeless 0 1 0 3 0  PHQ - 2 Score 0 1 0 6 0  Altered sleeping  3     Tired, decreased energy  3     Change in appetite  3     Feeling bad or failure about yourself   0     Trouble concentrating  3     Moving slowly or fidgety/restless  3     Suicidal thoughts  0     PHQ-9 Score  16     Difficult doing work/chores  Extremely dIfficult           05/06/2024    3:00 PM  GAD 7 : Generalized Anxiety Score  Nervous, Anxious, on Edge 3  Control/stop worrying 3  Worry too much - different things 3  Trouble relaxing 3  Restless 3  Easily annoyed or irritable 3  Afraid - awful might happen 0  Total GAD 7 Score 18  Anxiety Difficulty Extremely difficult  Review of Systems:   Pertinent items are noted in HPI Denies any headaches, blurred vision, fatigue, shortness of breath, chest pain, abdominal pain, abnormal vaginal discharge/itching/odor/irritation, problems with periods, bowel movements, urination, or intercourse unless otherwise stated above. Pertinent History Reviewed:  Reviewed past medical,surgical, social and family history.  Reviewed problem list, medications and allergies. Physical Assessment:   Vitals:   06/18/24 1533  BP: (!) 130/90  Pulse: 72  SpO2: 100%  Weight: 154 lb 12.8 oz (70.2 kg)  Body mass index is 27.42 kg/m.        Physical Examination:   General appearance - well appearing, and in no distress  Mental status - alert, oriented to person, place, and time  Psych:  She has a normal mood and affect but does seem to have some memory issues  Skin - warm and dry, normal color, no suspicious lesions noted  Chest - effort normal, all lung fields clear to auscultation  bilaterally  Heart - normal rate and regular rhythm  Neck:  midline trachea, no thyromegaly or nodules  Abdomen - soft, nontender, nondistended, no masses or organomegaly  Pelvic - VULVA: normal appearing vulva with no masses, tenderness or lesions   VAGINA: atrophic vaginal mucosa, no lesions CERVIX: normal appearing cervix without discharge or lesions, no CMT  Thin prep pap is obtained with HR HPV cotesting  UTERUS: uterus is felt to be normal size, shape, consistency and nontender   ADNEXA: No adnexal masses or tenderness noted.  Rectal - normal rectal, good sphincter tone, no masses felt  Extremities:  No swelling or varicosities noted  Chaperone present for exam  No results found for this or any previous visit (from the past 24 hours).  Assessment & Plan:  1. Ovarian neoplasm (Primary) - pt does desire removal.  Would recommend BSO for this - CA 125 obtained today - will plan to check coags as close to surgical time  2. Cervical cancer screening - Cytology - PAP( [facility])  3. Essential hypertension  4. Multiple fractures of ribs, bilateral, initial encounter for closed fracture - will reach out to anesthesia about timing.  Suspect will suggest healing, possible 3 months after injury  5. Multiple sclerosis exacerbation - have reached out to Dr. Vear for input prior to surgical planning  6. History of alcohol abuse - highly encouraged a little intake as possible  7.  LE Edema - have reached out to DR. Krasowski prior to surgical planning as well  8.  H/o gastric bypass   Orders Placed This Encounter  Procedures   CA 125    Meds: No orders of the defined types were placed in this encounter.   Ronal GORMAN Pinal, MD 06/18/2024 4:10 PM

## 2024-06-19 ENCOUNTER — Ambulatory Visit (HOSPITAL_BASED_OUTPATIENT_CLINIC_OR_DEPARTMENT_OTHER): Payer: Self-pay | Admitting: Obstetrics & Gynecology

## 2024-06-19 LAB — CA 125: Cancer Antigen (CA) 125: 21.4 U/mL (ref 0.0–38.1)

## 2024-06-22 ENCOUNTER — Encounter: Payer: Self-pay | Admitting: Radiology

## 2024-06-22 DIAGNOSIS — M1990 Unspecified osteoarthritis, unspecified site: Secondary | ICD-10-CM | POA: Diagnosis not present

## 2024-06-22 DIAGNOSIS — Z9181 History of falling: Secondary | ICD-10-CM | POA: Diagnosis not present

## 2024-06-22 DIAGNOSIS — F1010 Alcohol abuse, uncomplicated: Secondary | ICD-10-CM | POA: Diagnosis not present

## 2024-06-22 DIAGNOSIS — H9193 Unspecified hearing loss, bilateral: Secondary | ICD-10-CM | POA: Diagnosis not present

## 2024-06-22 DIAGNOSIS — Z9049 Acquired absence of other specified parts of digestive tract: Secondary | ICD-10-CM | POA: Diagnosis not present

## 2024-06-22 DIAGNOSIS — G35A Relapsing-remitting multiple sclerosis: Secondary | ICD-10-CM | POA: Diagnosis not present

## 2024-06-22 DIAGNOSIS — S2241XD Multiple fractures of ribs, right side, subsequent encounter for fracture with routine healing: Secondary | ICD-10-CM | POA: Diagnosis not present

## 2024-06-22 DIAGNOSIS — Z87440 Personal history of urinary (tract) infections: Secondary | ICD-10-CM | POA: Diagnosis not present

## 2024-06-22 DIAGNOSIS — R1310 Dysphagia, unspecified: Secondary | ICD-10-CM | POA: Diagnosis not present

## 2024-06-22 DIAGNOSIS — I10 Essential (primary) hypertension: Secondary | ICD-10-CM | POA: Diagnosis not present

## 2024-06-22 DIAGNOSIS — Z9884 Bariatric surgery status: Secondary | ICD-10-CM | POA: Diagnosis not present

## 2024-06-22 DIAGNOSIS — K222 Esophageal obstruction: Secondary | ICD-10-CM | POA: Diagnosis not present

## 2024-06-22 DIAGNOSIS — J45909 Unspecified asthma, uncomplicated: Secondary | ICD-10-CM | POA: Diagnosis not present

## 2024-06-22 DIAGNOSIS — G4700 Insomnia, unspecified: Secondary | ICD-10-CM | POA: Diagnosis not present

## 2024-06-22 DIAGNOSIS — F419 Anxiety disorder, unspecified: Secondary | ICD-10-CM | POA: Diagnosis not present

## 2024-06-22 DIAGNOSIS — K219 Gastro-esophageal reflux disease without esophagitis: Secondary | ICD-10-CM | POA: Diagnosis not present

## 2024-06-22 DIAGNOSIS — D509 Iron deficiency anemia, unspecified: Secondary | ICD-10-CM | POA: Diagnosis not present

## 2024-06-22 DIAGNOSIS — G2581 Restless legs syndrome: Secondary | ICD-10-CM | POA: Diagnosis not present

## 2024-06-22 DIAGNOSIS — F32A Depression, unspecified: Secondary | ICD-10-CM | POA: Diagnosis not present

## 2024-06-22 DIAGNOSIS — Z974 Presence of external hearing-aid: Secondary | ICD-10-CM | POA: Diagnosis not present

## 2024-06-22 LAB — CYTOLOGY - PAP
Comment: NEGATIVE
Diagnosis: NEGATIVE
High risk HPV: NEGATIVE

## 2024-06-24 ENCOUNTER — Telehealth: Payer: Self-pay

## 2024-06-24 NOTE — Telephone Encounter (Signed)
 [facility] faxed 06/23/24 with confirmation received

## 2024-06-25 ENCOUNTER — Telehealth: Payer: Self-pay | Admitting: *Deleted

## 2024-06-25 NOTE — Telephone Encounter (Signed)
 Faxed updated order/notes to [facility], received fax confirmation.

## 2024-07-01 ENCOUNTER — Telehealth: Payer: Self-pay | Admitting: *Deleted

## 2024-07-01 DIAGNOSIS — Z87440 Personal history of urinary (tract) infections: Secondary | ICD-10-CM | POA: Diagnosis not present

## 2024-07-01 DIAGNOSIS — J45909 Unspecified asthma, uncomplicated: Secondary | ICD-10-CM | POA: Diagnosis not present

## 2024-07-01 DIAGNOSIS — Z9049 Acquired absence of other specified parts of digestive tract: Secondary | ICD-10-CM | POA: Diagnosis not present

## 2024-07-01 DIAGNOSIS — H9193 Unspecified hearing loss, bilateral: Secondary | ICD-10-CM | POA: Diagnosis not present

## 2024-07-01 DIAGNOSIS — D509 Iron deficiency anemia, unspecified: Secondary | ICD-10-CM | POA: Diagnosis not present

## 2024-07-01 DIAGNOSIS — F1010 Alcohol abuse, uncomplicated: Secondary | ICD-10-CM | POA: Diagnosis not present

## 2024-07-01 DIAGNOSIS — K219 Gastro-esophageal reflux disease without esophagitis: Secondary | ICD-10-CM | POA: Diagnosis not present

## 2024-07-01 DIAGNOSIS — K222 Esophageal obstruction: Secondary | ICD-10-CM | POA: Diagnosis not present

## 2024-07-01 DIAGNOSIS — F32A Depression, unspecified: Secondary | ICD-10-CM | POA: Diagnosis not present

## 2024-07-01 DIAGNOSIS — Z9181 History of falling: Secondary | ICD-10-CM | POA: Diagnosis not present

## 2024-07-01 DIAGNOSIS — Z9884 Bariatric surgery status: Secondary | ICD-10-CM | POA: Diagnosis not present

## 2024-07-01 DIAGNOSIS — I10 Essential (primary) hypertension: Secondary | ICD-10-CM | POA: Diagnosis not present

## 2024-07-01 DIAGNOSIS — S2241XD Multiple fractures of ribs, right side, subsequent encounter for fracture with routine healing: Secondary | ICD-10-CM | POA: Diagnosis not present

## 2024-07-01 DIAGNOSIS — R1310 Dysphagia, unspecified: Secondary | ICD-10-CM | POA: Diagnosis not present

## 2024-07-01 DIAGNOSIS — G2581 Restless legs syndrome: Secondary | ICD-10-CM | POA: Diagnosis not present

## 2024-07-01 DIAGNOSIS — M1990 Unspecified osteoarthritis, unspecified site: Secondary | ICD-10-CM | POA: Diagnosis not present

## 2024-07-01 DIAGNOSIS — F419 Anxiety disorder, unspecified: Secondary | ICD-10-CM | POA: Diagnosis not present

## 2024-07-01 DIAGNOSIS — G35A Relapsing-remitting multiple sclerosis: Secondary | ICD-10-CM | POA: Diagnosis not present

## 2024-07-01 DIAGNOSIS — G4700 Insomnia, unspecified: Secondary | ICD-10-CM | POA: Diagnosis not present

## 2024-07-01 DIAGNOSIS — Z974 Presence of external hearing-aid: Secondary | ICD-10-CM | POA: Diagnosis not present

## 2024-07-01 NOTE — Telephone Encounter (Signed)
 Received below fax from pt insurance. [facility] here at our office now. I spoke with her about pt and letter received. She sent urgent message on her end to see if infusion was approved prior to pt receiving on 05/18/24. She will f/u via secure email with update.

## 2024-07-01 NOTE — Telephone Encounter (Signed)
 Wrote letter medical necessity. Placed in MD office for signature.

## 2024-07-02 ENCOUNTER — Ambulatory Visit
Admission: EM | Admit: 2024-07-02 | Discharge: 2024-07-02 | Disposition: A | Discharge status: Home / Self Care | Attending: Family Medicine | Admitting: Family Medicine

## 2024-07-02 DIAGNOSIS — N3001 Acute cystitis with hematuria: Secondary | ICD-10-CM | POA: Insufficient documentation

## 2024-07-02 DIAGNOSIS — R300 Dysuria: Secondary | ICD-10-CM | POA: Diagnosis not present

## 2024-07-02 LAB — POCT URINE DIPSTICK
Bilirubin, UA: NEGATIVE
Glucose, UA: NEGATIVE mg/dL
Ketones, POC UA: NEGATIVE mg/dL
Nitrite, UA: POSITIVE — AB
POC PROTEIN,UA: NEGATIVE
Spec Grav, UA: 1.015 (ref 1.010–1.025)
Urobilinogen, UA: 0.2 U/dL
pH, UA: 7.5 (ref 5.0–8.0)

## 2024-07-02 MED ORDER — CEPHALEXIN 500 MG PO CAPS: 500.0000 mg | ORAL_CAPSULE | Freq: Two times a day (BID) | ORAL | 0 refills | Status: AC

## 2024-07-02 NOTE — ED Provider Notes (Addendum)
 [facility]  Note:  This document was prepared using Conservation officer, historic buildings and may include unintentional dictation errors.  MRN: [id number] DOB: 09-13-1960  Subjective:   Catherine Hubbard is a 63 y.o. female presenting for 5-day history of persistent dysuria, malodorous urine, malaise, fatigue.  No fever, nausea, vomiting, pelvic pain, hematuria.  Has a remote history of urinary tract infections.  No current facility-administered medications for this encounter.  Current Outpatient Medications:    acetaminophen  (TYLENOL ) 500 MG tablet, Take 2 tablets (1,000 mg total) by mouth every 6 (six) hours as needed for mild pain (pain score 1-3)., Disp: , Rfl:    acetaminophen  (TYLENOL ) 500 MG tablet, Take 2 tablets (1,000 mg total) by mouth every 6 (six) hours., Disp: 30 tablet, Rfl: 0   albuterol  (PROVENTIL ) (2.5 MG/3ML) 0.083% nebulizer solution, INHALE 3 ML BY NEBULIZATION EVERY 6 HOURS AS NEEDED FOR WHEEZING OR SHORTNESS OF BREATH, Disp: 150 mL, Rfl: 0   albuterol  (VENTOLIN  HFA) 108 (90 Base) MCG/ACT inhaler, TAKE 2 PUFFS BY MOUTH EVERY 6 HOURS AS NEEDED FOR WHEEZE OR SHORTNESS OF BREATH, Disp: 18 each, Rfl: 1   B Complex Vitamins (VITAMIN B COMPLEX PO), Take 1 tablet by mouth daily. 1 daily, Disp: , Rfl:    baclofen  (LIORESAL ) 10 MG tablet, Take 1 tablet (10 mg total) by mouth 3 (three) times daily as needed for muscle spasms., Disp: 30 tablet, Rfl: 0   bismuth  subsalicylate (PEPTO BISMOL) 262 MG/15ML suspension, Take 30 mLs by mouth every 4 (four) hours as needed for indigestion or diarrhea or loose stools (acid reflux)., Disp: , Rfl:    budesonide -formoterol  (SYMBICORT ) 160-4.5 MCG/ACT inhaler, Inhale 2 puffs into the lungs 2 (two) times daily. (Patient taking differently: Inhale 2 puffs into the lungs 2 (two) times daily as needed (Asthma).), Disp: 1 each, Rfl: 3   busPIRone  (BUSPAR ) 7.5 MG tablet, TAKE 2 TABLETS (15 MG TOTAL) BY MOUTH 2 (TWO) TIMES  DAILY., Disp: 360 tablet, Rfl: 0   Calcium  Carbonate Antacid (TUMS PO), Take 2 tablets by mouth 2 (two) times daily as needed (acid reflux)., Disp: , Rfl:    Cholecalciferol (D2000 ULTRA STRENGTH) 50 MCG (2000 UT) CAPS, Take 4,000 Units by mouth daily., Disp: , Rfl:    docusate sodium  (COLACE) 100 MG capsule, Take 1 capsule (100 mg total) by mouth daily as needed for mild constipation. (Patient not taking: Reported on 05/06/2024), Disp: , Rfl:    fluticasone  (FLONASE ) 50 MCG/ACT nasal spray, SPRAY 2 SPRAYS INTO EACH NOSTRIL EVERY DAY (Patient taking differently: Place 2 sprays into both nostrils 2 (two) times daily as needed for allergies or rhinitis.), Disp: 48 mL, Rfl: 3   gabapentin  (NEURONTIN ) 100 MG capsule, Take 2 capsules (200 mg total) by mouth 3 (three) times daily., Disp: 30 capsule, Rfl: 0   HYDROcodone -acetaminophen  (NORCO/VICODIN) 5-325 MG tablet, Take 1 tablet by mouth every 6 (six) hours as needed for moderate pain (pain score 4-6)., Disp: 30 tablet, Rfl: 0   lidocaine  (LIDODERM ) 5 %, Place 1 patch onto the skin daily. Remove & Discard patch within 12 hours or as directed by MD, Disp: 30 patch, Rfl: 0   modafinil  (PROVIGIL ) 200 MG tablet, TAKE 1 TABLET BY MOUTH IN THE MORNING AND 1/2 TABLET IN THE EVENING. CALL [phone] TO SCHEDULE FOLLOW UP AROUND 02/10/24 FOR ONGOING REFILLS. (Patient taking differently: Take 100-200 mg by mouth 2 (two) times daily as needed (Hypersomnia).), Disp: 45 tablet, Rfl: 2   Multiple Vitamin (  MULTIVITAMIN WITH MINERALS) TABS tablet, Take 1 tablet by mouth daily., Disp: , Rfl:    nystatin  cream (MYCOSTATIN ), Apply 1 Application topically 2 (two) times daily. (Patient taking differently: Apply 1 Application topically 2 (two) times daily as needed for dry skin.), Disp: 30 g, Rfl: 1   olmesartan  (BENICAR ) 5 MG tablet, TAKE 2 TABLETS BY MOUTH EVERY DAY (Patient taking differently: Take 5 mg by mouth daily.), Disp: 180 tablet, Rfl: 3   oxybutynin  (DITROPAN ) 5 MG  tablet, TAKE 1 TABLET BY MOUTH TWICE A DAY (Patient taking differently: Take 2.5-5 mg by mouth 2 (two) times daily as needed for bladder spasms.), Disp: 180 tablet, Rfl: 0   oxyCODONE  (OXY IR/ROXICODONE ) 5 MG immediate release tablet, Take 1-2 tablets (5-10 mg total) by mouth every 4 (four) hours as needed for moderate pain (pain score 4-6)., Disp: 30 tablet, Rfl: 0   pantoprazole  (PROTONIX ) 40 MG tablet, Take 1 tablet (40 mg total) by mouth daily., Disp: 30 tablet, Rfl: 3   Probiotic Product (PROBIOTIC DAILY PO), Take 1 tablet by mouth 3 (three) times a week., Disp: , Rfl:    Allergies  Allergen Reactions   Ibuprofen  Other (See Comments)    Cannot take due to gastric bypass   Nsaids Other (See Comments)    Can not take due to Gastric bypass    Past Medical History:  Diagnosis Date   Alcohol abuse    Asthma    Esophagitis    GERD (gastroesophageal reflux disease)    Headache    Hearing loss    Hypertension    Multiple sclerosis    Pancreatitis    Vision abnormalities      Past Surgical History:  Procedure Laterality Date   ANKLE FRACTURE SURGERY Bilateral    CARPAL TUNNEL RELEASE Bilateral    CHOLECYSTECTOMY     COLONOSCOPY  11/2016   multiple   ESOPHAGOGASTRODUODENOSCOPY  11/2016   ESOPHAGOGASTRODUODENOSCOPY N/A 02/01/2017   Procedure: ESOPHAGOGASTRODUODENOSCOPY (EGD);  Surgeon: Avram Lupita BRAVO, MD;  Location: [facility] ENDOSCOPY;  Service: Endoscopy;  Laterality: N/A;   ESOPHAGOGASTRODUODENOSCOPY (EGD) WITH PROPOFOL  N/A 02/04/2017   Procedure: ESOPHAGOGASTRODUODENOSCOPY (EGD) WITH PROPOFOL ;  Surgeon: Aneita Gwendlyn DASEN, MD;  Location: [facility] ENDOSCOPY;  Service: Endoscopy;  Laterality: N/A;   LAPAROSCOPIC GASTRIC SLEEVE RESECTION  2011   ULNAR NERVE REPAIR Bilateral     Family History  Problem Relation Age of Onset   Cancer Mother    Stroke Mother    Cancer Father     Social History   Tobacco Use   Smoking status: Never   Smokeless tobacco: Never   Tobacco comments:     second hand smoker when young. Heavy exposure.  Vaping Use   Vaping status: Never Used  Substance Use Topics   Alcohol use: Yes    Comment: weekly   Drug use: No    ROS   Objective:   Vitals: BP (!) 156/94 (BP Location: Right Arm)   Pulse 63   Resp 20   LMP 08/13/2014 Comment: very few periods  SpO2 96%   Physical Exam Constitutional:      General: She is not in acute distress.    Appearance: Normal appearance. She is well-developed. She is not ill-appearing, toxic-appearing or diaphoretic.  HENT:     Head: Normocephalic and atraumatic.     Nose: Nose normal.     Mouth/Throat:     Mouth: Mucous membranes are moist.  Eyes:     General: No scleral icterus.  Right eye: No discharge.        Left eye: No discharge.     Extraocular Movements: Extraocular movements intact.     Conjunctiva/sclera: Conjunctivae normal.  Cardiovascular:     Rate and Rhythm: Normal rate.  Pulmonary:     Effort: Pulmonary effort is normal.  Abdominal:     General: Bowel sounds are normal. There is no distension.     Palpations: Abdomen is soft. There is no mass.     Tenderness: There is no abdominal tenderness. There is no right CVA tenderness, left CVA tenderness, guarding or rebound.  Skin:    General: Skin is warm and dry.  Neurological:     General: No focal deficit present.     Mental Status: She is alert and oriented to person, place, and time.  Psychiatric:        Mood and Affect: Mood normal.        Behavior: Behavior normal.        Thought Content: Thought content normal.        Judgment: Judgment normal.     Results for orders placed or performed during the hospital encounter of 07/02/24 (from the past 24 hours)  POCT URINE DIPSTICK     Status: Abnormal   Collection Time: 07/02/24 10:24 AM  Result Value Ref Range   Color, UA yellow yellow   Clarity, UA hazy (A) clear   Glucose, UA negative negative mg/dL   Bilirubin, UA negative negative   Ketones, POC UA negative  negative mg/dL   Spec Grav, UA 8.984 8.989 - 1.025   Blood, UA trace-intact (A) negative   pH, UA 7.5 5.0 - 8.0   POC PROTEIN,UA negative negative, trace   Urobilinogen, UA 0.2 0.2 or 1.0 E.U./dL   Nitrite, UA Positive (A) Negative   Leukocytes, UA Small (1+) (A) Negative    Assessment and Plan :   PDMP not reviewed this encounter.  1. Acute cystitis with hematuria   2. Dysuria    Start cephalexin  to cover for acute cystitis, urine culture pending.  Recommended consistent hydration, limiting urinary irritants. Counseled patient on potential for adverse effects with medications prescribed/recommended today, ER and return-to-clinic precautions discussed, patient verbalized understanding.   Christopher Savannah, PA-C 07/02/24 1037

## 2024-07-02 NOTE — Discharge Instructions (Addendum)
 Please start cephalexin  to address an urinary tract infection. Make sure you hydrate very well with plain water and a quantity of 80 ounces of water a day.  Please limit drinks that are considered urinary irritants such as fruit juices, soda, sweet tea, coffee, artifical sweetened drinks, energy drinks, alcohol.  These can worsen your urinary and genital symptoms but also be the source of them.  I will let you know about your urine culture results through MyChart to see if we need to prescribe or change your antibiotics based off of those results.

## 2024-07-02 NOTE — ED Triage Notes (Signed)
 Pt c/o dysuria, foul smelling urine, feeling tired-sx x 5-6 days-denies fever-NAD-slow gait with own cane

## 2024-07-02 NOTE — Telephone Encounter (Signed)
 Per Gina[facility], below is not a denial. They just need us  to fax requested info. I faxed letter of medical necessity/notes to [phone], received fax confirmation.

## 2024-07-04 LAB — URINE CULTURE: Culture: >=100000 — AB

## 2024-07-05 ENCOUNTER — Other Ambulatory Visit (HOSPITAL_BASED_OUTPATIENT_CLINIC_OR_DEPARTMENT_OTHER): Payer: Self-pay | Admitting: Obstetrics & Gynecology

## 2024-07-05 DIAGNOSIS — D4959 Neoplasm of unspecified behavior of other genitourinary organ: Secondary | ICD-10-CM

## 2024-07-06 ENCOUNTER — Ambulatory Visit (HOSPITAL_COMMUNITY): Payer: Self-pay

## 2024-07-06 MED ORDER — SULFAMETHOXAZOLE-TRIMETHOPRIM 800-160 MG PO TABS: 1.0000 | ORAL_TABLET | Freq: Two times a day (BID) | ORAL | 0 refills | Status: AC

## 2024-07-12 ENCOUNTER — Other Ambulatory Visit: Payer: Self-pay | Admitting: Medical

## 2024-07-13 ENCOUNTER — Ambulatory Visit: Payer: Self-pay

## 2024-07-13 DIAGNOSIS — G4700 Insomnia, unspecified: Secondary | ICD-10-CM | POA: Diagnosis not present

## 2024-07-13 DIAGNOSIS — Z9884 Bariatric surgery status: Secondary | ICD-10-CM | POA: Diagnosis not present

## 2024-07-13 DIAGNOSIS — G35A Relapsing-remitting multiple sclerosis: Secondary | ICD-10-CM | POA: Diagnosis not present

## 2024-07-13 DIAGNOSIS — F32A Depression, unspecified: Secondary | ICD-10-CM | POA: Diagnosis not present

## 2024-07-13 DIAGNOSIS — Z974 Presence of external hearing-aid: Secondary | ICD-10-CM | POA: Diagnosis not present

## 2024-07-13 DIAGNOSIS — G2581 Restless legs syndrome: Secondary | ICD-10-CM | POA: Diagnosis not present

## 2024-07-13 DIAGNOSIS — I10 Essential (primary) hypertension: Secondary | ICD-10-CM | POA: Diagnosis not present

## 2024-07-13 DIAGNOSIS — M1990 Unspecified osteoarthritis, unspecified site: Secondary | ICD-10-CM | POA: Diagnosis not present

## 2024-07-13 DIAGNOSIS — Z9049 Acquired absence of other specified parts of digestive tract: Secondary | ICD-10-CM | POA: Diagnosis not present

## 2024-07-13 DIAGNOSIS — R1310 Dysphagia, unspecified: Secondary | ICD-10-CM | POA: Diagnosis not present

## 2024-07-13 DIAGNOSIS — K219 Gastro-esophageal reflux disease without esophagitis: Secondary | ICD-10-CM | POA: Diagnosis not present

## 2024-07-13 DIAGNOSIS — K222 Esophageal obstruction: Secondary | ICD-10-CM | POA: Diagnosis not present

## 2024-07-13 DIAGNOSIS — Z87440 Personal history of urinary (tract) infections: Secondary | ICD-10-CM | POA: Diagnosis not present

## 2024-07-13 DIAGNOSIS — F1010 Alcohol abuse, uncomplicated: Secondary | ICD-10-CM | POA: Diagnosis not present

## 2024-07-13 DIAGNOSIS — H9193 Unspecified hearing loss, bilateral: Secondary | ICD-10-CM | POA: Diagnosis not present

## 2024-07-13 DIAGNOSIS — Z9181 History of falling: Secondary | ICD-10-CM | POA: Diagnosis not present

## 2024-07-13 DIAGNOSIS — J45909 Unspecified asthma, uncomplicated: Secondary | ICD-10-CM | POA: Diagnosis not present

## 2024-07-13 DIAGNOSIS — D509 Iron deficiency anemia, unspecified: Secondary | ICD-10-CM | POA: Diagnosis not present

## 2024-07-13 DIAGNOSIS — S2241XD Multiple fractures of ribs, right side, subsequent encounter for fracture with routine healing: Secondary | ICD-10-CM | POA: Diagnosis not present

## 2024-07-13 DIAGNOSIS — F419 Anxiety disorder, unspecified: Secondary | ICD-10-CM | POA: Diagnosis not present

## 2024-07-13 NOTE — Telephone Encounter (Signed)
 This RN attempted to contact patient, no answer, left voicemail message. Will place in Call Back folder.

## 2024-07-13 NOTE — Telephone Encounter (Signed)
 Nurse will attempt to contact patient.

## 2024-07-13 NOTE — Telephone Encounter (Signed)
 Answer Assessment - Initial Assessment Questions 1. REASON FOR CALL: What is the main reason for your call? or How can I best help you? Catherine Hubbard with [facility], calling back. BP 113/88, HR 120, 98.2. Reports cough, congestion, runny nose. No other symptoms, patient feels fine.  Protocols used: Information Only Call - No Triage-A-AH

## 2024-07-13 NOTE — Telephone Encounter (Signed)
 Attempted to reach patient and Physical Therapist to discuss concerns. Left VM with both to call back. Attempt #1  Copied from CRM #[id number]. Topic: Clinical - Home Health Verbal Orders >> Jul 13, 2024  9:55 AM Delon DASEN wrote: Caller/Agency: Chyrl with [facility] Callback Number: [phone] secure Service Requested: Physical Therapy Frequency: n/a Any new concerns about the patient? Yes- resting heart rate in the 120's bp was around 113/88, patient has cold symptoms, cough, congestion, runny nose

## 2024-07-13 NOTE — Telephone Encounter (Addendum)
 3rd attempt to reach patient. Unable to leave voicemail.

## 2024-07-14 ENCOUNTER — Encounter: Payer: Self-pay | Admitting: Medical

## 2024-08-02 ENCOUNTER — Telehealth: Payer: Self-pay | Admitting: Medical

## 2024-08-02 NOTE — Telephone Encounter (Signed)
 Form sent to me that that states [facility] . Form states provider name [facility]. Appears was to be sent to them and not me? Send to them [facility] or call customer service for explanation.

## 2024-08-05 ENCOUNTER — Encounter: Payer: Self-pay | Admitting: *Deleted

## 2024-08-06 DIAGNOSIS — Z0289 Encounter for other administrative examinations: Secondary | ICD-10-CM

## 2024-08-10 ENCOUNTER — Telehealth: Payer: Self-pay

## 2024-08-10 NOTE — Telephone Encounter (Signed)
 [facility] forms faxed with confirmation received

## 2024-08-26 ENCOUNTER — Other Ambulatory Visit: Payer: Self-pay | Admitting: Medical

## 2024-08-26 NOTE — Telephone Encounter (Signed)
 Rx refill sent to pharmacy.

## 2024-09-09 ENCOUNTER — Ambulatory Visit: Admitting: Family Medicine

## 2024-09-10 ENCOUNTER — Encounter: Payer: Self-pay | Admitting: Medical

## 2024-09-11 ENCOUNTER — Encounter: Payer: Self-pay | Admitting: Neurology

## 2024-09-17 ENCOUNTER — Encounter (HOSPITAL_BASED_OUTPATIENT_CLINIC_OR_DEPARTMENT_OTHER): Payer: Self-pay | Admitting: Obstetrics & Gynecology

## 2024-09-21 ENCOUNTER — Telehealth: Payer: Self-pay

## 2024-09-21 ENCOUNTER — Ambulatory Visit: Admitting: Medical

## 2024-09-21 NOTE — Telephone Encounter (Signed)
 Patient called back to confirm her availability for surgery on 10/19/24 with Dr. Cleotilde at 10 am/[facility]. Pre-Op instructions were provided by phone.

## 2024-09-25 ENCOUNTER — Ambulatory Visit: Admitting: Neurology

## 2024-09-25 ENCOUNTER — Other Ambulatory Visit: Payer: Self-pay

## 2024-09-25 ENCOUNTER — Encounter: Payer: Self-pay | Admitting: Neurology

## 2024-09-25 ENCOUNTER — Other Ambulatory Visit (HOSPITAL_BASED_OUTPATIENT_CLINIC_OR_DEPARTMENT_OTHER): Payer: Self-pay

## 2024-09-25 VITALS — BP 132/79 | HR 70 | Ht 62.0 in | Wt 154.0 lb

## 2024-09-25 DIAGNOSIS — G35C1 Active secondary progressive multiple sclerosis: Secondary | ICD-10-CM

## 2024-09-25 DIAGNOSIS — Z79899 Other long term (current) drug therapy: Secondary | ICD-10-CM

## 2024-09-25 DIAGNOSIS — G8929 Other chronic pain: Secondary | ICD-10-CM

## 2024-09-25 DIAGNOSIS — R3915 Urgency of urination: Secondary | ICD-10-CM

## 2024-09-25 DIAGNOSIS — R5383 Other fatigue: Secondary | ICD-10-CM

## 2024-09-25 DIAGNOSIS — R260 Ataxic gait: Secondary | ICD-10-CM

## 2024-09-25 MED ORDER — BACLOFEN 10 MG PO TABS
10.0000 mg | ORAL_TABLET | Freq: Three times a day (TID) | ORAL | 11 refills | Status: AC | PRN
  Filled 2024-09-25: qty 90, 30d supply, fill #0

## 2024-09-25 MED ORDER — OXYBUTYNIN CHLORIDE 5 MG PO TABS
5.0000 mg | ORAL_TABLET | Freq: Two times a day (BID) | ORAL | 0 refills | Status: AC
  Filled 2024-09-25: qty 180, 90d supply, fill #0

## 2024-09-25 MED ORDER — MODAFINIL 200 MG PO TABS
ORAL_TABLET | ORAL | 5 refills | Status: AC
  Filled 2024-09-25: qty 45, 30d supply, fill #0

## 2024-09-25 NOTE — Progress Notes (Signed)
 "  [facility]  PATIENT: Catherine Hubbard DOB: [birth year]   _________________________________   HISTORICAL  CHIEF COMPLAINT:  Chief Complaint  Patient presents with   Follow-up    Pt in room 10. Alone. Here for MS follow up    HISTORY OF PRESENT ILLNESS:  [name] is a 64 y.o. woman with relapsing remitting MS.    Update 09/25/2024: She is on Ocrevus  - next infusion is in April 2026.  She has tolerated the infusions well.  She denies any  exacerbations or new neurologic symptoms.    Last year, CD20 count was 0 and IgG and IgM were normal.     We discussed that with very minimal change over 2-4 years on MRI, I do not think she is failing the Ocrevus .   She may not do better on any other treatment so for now will continue with it.     Gait is stable.  She is stable and no definite worsening. She is walking without her cane at times and with it for longer distance (in car right now).  She has no recent falls.  She uses the banister on stairs.  She has mild weakness in her legs.    She denies numbness but ha mild tingling at times in hands.   Hands are sometimes clumsy  Vision is doing well.      She has urinary urgency and no recent incontinence.  . She takes oxybutynin  if she leaves the house.  She reports being fatigued.  Modafinil  helps some.  She did better on 1 in am and 1/2 in pm   Mood has been stable.  She reports she is rarely drinking ETOH now.     Sleep varies.       She has some word finding errors   She has spine pain and knee pain but tramadol  has not helped - does make her sleepy.    MS History:   She was diagnosed with multiple sclerosis in 2009. Before that time, she had noted some gait ataxia but had not had any imaging studies. In 2009, due to progressive hearing loss, she had an MRI of the brain and  then had a lumbar puncture that also confirmed the diagnosis.  I have some of her early MRI reports there are several foci noted in the  cervical and thoracic spinal cord in the brain, there were multiple hyperintense foci, mostly in the periventricular deep white matter many of these were contrast enhancing on 07/26/2008. There was another enhancing lesion adjacent to C2 in the spinal cord.  Initially, she was placed on Avonex . She did not feel good and she stopped. She then tried Copaxone but also stopped after a while. She moved here in 2014 and saw Dr. Clarice at [place]. She was placed on Rebif . She tolerates Rebif  well.       She was on Vumerity in 2018-2021 (drug trial and then switched to OCrevus  2022.    On 07/17/2023 she went to the ED with hand weakness ad MRI reportedly showed progression.   However, by my review,  no definite progression compared to 2020 in the brain.  She has had 2 small strokes since 2020 in the left temporal and right frontal lobe but they were present on the CT scan from 2023.       IMAGING: MRI from 12/04/2012  showed multiple foci in the spinal cord at C2, C3-C4, and C4-C5. There were also foci at T4, T7,  T9 and T10. The MRI of the brain showed 2 nonenhancing foci not present from studies in 2011. She reports having an MRI at [facility] 2015 but we do not have those films.  MRI cervical spine 07/22/2021 shows foci at C2, C4 and C5, seen previously.   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   MRI brain 11/16/2018 showed MS lesion with no progression compared to 2018.    MRI of the brain 07/17/2023 showed MS lesions in the cerebral hemispheres very slightly progressed compared to the 2020 MRI.  Additional foci noted in the pons and adjacent to C2 in the spinal cord.  There also were small chronic infarctions In the left lateral temporal lobe and right frontal lobe, not clearly seen on the previous MRI from 2020 but present on the CT scan from 12/06/2018 2020.  No acute findings.  MRI of the cervical spine 07/17/2023 showed T2 hyperintense foci centrally at C2, to the right adjacent to  C3-C4, posteriorly adjacent to C4-C5, posteriorly to the left adjacent to C5 and centrally adjacent to C5-C6.  None of these appear to be acute.  There was mild spinal stenosis at C5-C6.  No nerve root compression.   REVIEW OF SYSTEMS:  Constitutional: No fevers, chills, sweats, or change in appetite.    She reports fatigue and insomnia Eyes: see above.  No eye pain  But some light sensitivity. Ear, nose and throat: No hearing loss, ear pain, nasal congestion, sore throat.   tinnitus Cardiovascular: No chest pain, palpitations Respiratory:  No shortness of breath at rest or with exertion.   Some wheezes, once on asthma med's Gastrointestinal: No nausea, vomiting, diarrhea, abdominal pain, fecal incontinence.  She has esophagitis Genitourinary:  see above Musculoskeletal:  see above  She also notes neck pain Integumentary: No rash, pruritus, skin lesions Neurological: as above Psychiatric: as above Endocrine: No palpitations, diaphoresis, change in appetite, change in weigh or increased thirst.  Often feels cold Hematologic/Lymphatic:  No anemia, purpura, petechiae. Allergic/Immunologic: No itchy/runny eyes, nasal congestion, recent allergic reactions, rashes  ALLERGIES: Allergies  Allergen Reactions   Ibuprofen  Other (See Comments)    Cannot take due to gastric bypass   Nsaids Other (See Comments)    Can not take due to Gastric bypass    HOME MEDICATIONS:  Current Outpatient Medications:    albuterol  (PROVENTIL ) (2.5 MG/3ML) 0.083% nebulizer solution, INHALE 3 ML BY NEBULIZATION EVERY 6 HOURS AS NEEDED FOR WHEEZING OR SHORTNESS OF BREATH, Disp: 150 mL, Rfl: 0   albuterol  (VENTOLIN  HFA) 108 (90 Base) MCG/ACT inhaler, TAKE 2 PUFFS BY MOUTH EVERY 6 HOURS AS NEEDED FOR WHEEZE OR SHORTNESS OF BREATH, Disp: 18 each, Rfl: 1   B Complex Vitamins (VITAMIN B COMPLEX PO), Take 1 tablet by mouth daily. 1 daily, Disp: , Rfl:    baclofen  (LIORESAL ) 10 MG tablet, Take 1 tablet (10 mg total) by  mouth 3 (three) times daily as needed for muscle spasms., Disp: 30 tablet, Rfl: 0   bismuth  subsalicylate (PEPTO BISMOL) 262 MG/15ML suspension, Take 30 mLs by mouth every 4 (four) hours as needed for indigestion or diarrhea or loose stools (acid reflux)., Disp: , Rfl:    budesonide -formoterol  (SYMBICORT ) 160-4.5 MCG/ACT inhaler, Inhale 2 puffs into the lungs in the morning and at bedtime., Disp: 10.2 each, Rfl: 3   busPIRone  (BUSPAR ) 7.5 MG tablet, Take 2 tablets (15 mg total) by mouth 2 (two) times daily., Disp: 360 tablet, Rfl: 0   Calcium  Carbonate  Antacid (TUMS PO), Take 2 tablets by mouth 2 (two) times daily as needed (acid reflux)., Disp: , Rfl:    Cholecalciferol (D2000 ULTRA STRENGTH) 50 MCG (2000 UT) CAPS, Take 4,000 Units by mouth daily., Disp: , Rfl:    docusate sodium  (COLACE) 100 MG capsule, Take 1 capsule (100 mg total) by mouth daily as needed for mild constipation., Disp: , Rfl:    fluticasone  (FLONASE ) 50 MCG/ACT nasal spray, SPRAY 2 SPRAYS INTO EACH NOSTRIL EVERY DAY (Patient taking differently: Place 2 sprays into both nostrils 2 (two) times daily as needed for allergies or rhinitis.), Disp: 48 mL, Rfl: 3   gabapentin  (NEURONTIN ) 100 MG capsule, Take 2 capsules (200 mg total) by mouth 3 (three) times daily., Disp: 30 capsule, Rfl: 0   HYDROcodone -acetaminophen  (NORCO/VICODIN) 5-325 MG tablet, Take 1 tablet by mouth every 6 (six) hours as needed for moderate pain (pain score 4-6)., Disp: 30 tablet, Rfl: 0   lidocaine  (LIDODERM ) 5 %, Place 1 patch onto the skin daily. Remove & Discard patch within 12 hours or as directed by MD, Disp: 30 patch, Rfl: 0   modafinil  (PROVIGIL ) 200 MG tablet, TAKE 1 TABLET BY MOUTH IN THE MORNING AND 1/2 TABLET IN THE EVENING. CALL [phone] TO SCHEDULE FOLLOW UP AROUND 02/10/24 FOR ONGOING REFILLS. (Patient taking differently: Take 100-200 mg by mouth 2 (two) times daily as needed (Hypersomnia).), Disp: 45 tablet, Rfl: 2   Multiple Vitamin (MULTIVITAMIN  WITH MINERALS) TABS tablet, Take 1 tablet by mouth daily., Disp: , Rfl:    nystatin  cream (MYCOSTATIN ), Apply 1 Application topically 2 (two) times daily. (Patient taking differently: Apply 1 Application topically 2 (two) times daily as needed for dry skin.), Disp: 30 g, Rfl: 1   olmesartan  (BENICAR ) 5 MG tablet, TAKE 2 TABLETS BY MOUTH EVERY DAY (Patient taking differently: Take 5 mg by mouth daily.), Disp: 180 tablet, Rfl: 3   oxybutynin  (DITROPAN ) 5 MG tablet, TAKE 1 TABLET BY MOUTH TWICE A DAY (Patient taking differently: Take 2.5-5 mg by mouth 2 (two) times daily as needed for bladder spasms.), Disp: 180 tablet, Rfl: 0   pantoprazole  (PROTONIX ) 40 MG tablet, Take 1 tablet (40 mg total) by mouth daily., Disp: 30 tablet, Rfl: 3   Probiotic Product (PROBIOTIC DAILY PO), Take 1 tablet by mouth 3 (three) times a week., Disp: , Rfl:    acetaminophen  (TYLENOL ) 500 MG tablet, Take 2 tablets (1,000 mg total) by mouth every 6 (six) hours as needed for mild pain (pain score 1-3). (Patient not taking: Reported on 09/25/2024), Disp: , Rfl:    acetaminophen  (TYLENOL ) 500 MG tablet, Take 2 tablets (1,000 mg total) by mouth every 6 (six) hours. (Patient not taking: Reported on 09/25/2024), Disp: 30 tablet, Rfl: 0   cephALEXin  (KEFLEX ) 500 MG capsule, Take 1 capsule (500 mg total) by mouth 2 (two) times daily. (Patient not taking: Reported on 09/25/2024), Disp: 10 capsule, Rfl: 0   oxyCODONE  (OXY IR/ROXICODONE ) 5 MG immediate release tablet, Take 1-2 tablets (5-10 mg total) by mouth every 4 (four) hours as needed for moderate pain (pain score 4-6). (Patient not taking: Reported on 09/25/2024), Disp: 30 tablet, Rfl: 0  PAST MEDICAL HISTORY: Past Medical History:  Diagnosis Date   Alcohol abuse    Asthma    Esophagitis    GERD (gastroesophageal reflux disease)    Headache    Hearing loss    Hypertension    Multiple sclerosis    Pancreatitis    Vision abnormalities  PAST SURGICAL HISTORY: Past Surgical  History:  Procedure Laterality Date   ANKLE FRACTURE SURGERY Bilateral    CARPAL TUNNEL RELEASE Bilateral    CHOLECYSTECTOMY     COLONOSCOPY  11/2016   multiple   ESOPHAGOGASTRODUODENOSCOPY  11/2016   ESOPHAGOGASTRODUODENOSCOPY N/A 02/01/2017   Procedure: ESOPHAGOGASTRODUODENOSCOPY (EGD);  Surgeon: Avram Lupita BRAVO, MD;  Location: [facility] ENDOSCOPY;  Service: Endoscopy;  Laterality: N/A;   ESOPHAGOGASTRODUODENOSCOPY (EGD) WITH PROPOFOL  N/A 02/04/2017   Procedure: ESOPHAGOGASTRODUODENOSCOPY (EGD) WITH PROPOFOL ;  Surgeon: Aneita Gwendlyn DASEN, MD;  Location: [facility] ENDOSCOPY;  Service: Endoscopy;  Laterality: N/A;   LAPAROSCOPIC GASTRIC SLEEVE RESECTION  2011   ULNAR NERVE REPAIR Bilateral     FAMILY HISTORY: Family History  Problem Relation Age of Onset   Cancer Mother    Stroke Mother    Cancer Father     SOCIAL HISTORY:  Social History   Socioeconomic History   Marital status: Married    Spouse name: Not on file   Number of children: Not on file   Years of education: Not on file   Highest education level: Not on file  Occupational History   Not on file  Tobacco Use   Smoking status: Never   Smokeless tobacco: Never   Tobacco comments:    second hand smoker when young. Heavy exposure.  Vaping Use   Vaping status: Never Used  Substance and Sexual Activity   Alcohol use: Yes    Comment: weekly   Drug use: No   Sexual activity: Not Currently  Other Topics Concern   Not on file  Social History Narrative   Not on file   Social Drivers of Health   Tobacco Use: Low Risk (09/25/2024)   Patient History    Smoking Tobacco Use: Never    Smokeless Tobacco Use: Never    Passive Exposure: Not on file  Financial Resource Strain: Not on file  Food Insecurity: No Food Insecurity (05/01/2024)   Epic    Worried About Programme Researcher, Broadcasting/film/video in the Last Year: Never true    Ran Out of Food in the Last Year: Never true  Transportation Needs: No Transportation Needs (05/01/2024)   Epic    Lack  of Transportation (Medical): No    Lack of Transportation (Non-Medical): No  Physical Activity: Not on file  Stress: Not on file  Social Connections: Not on file  Intimate Partner Violence: Not At Risk (05/01/2024)   Epic    Fear of Current or Ex-Partner: No    Emotionally Abused: No    Physically Abused: No    Sexually Abused: No  Depression (PHQ2-9): Low Risk (06/18/2024)   Depression (PHQ2-9)    PHQ-2 Score: 0  Recent Concern: Depression (PHQ2-9) - High Risk (05/06/2024)   Depression (PHQ2-9)    PHQ-2 Score: 16  Alcohol Screen: Not on file  Housing: Unknown (05/01/2024)   Epic    Unable to Pay for Housing in the Last Year: No    Number of Times Moved in the Last Year: Not on file    Homeless in the Last Year: No  Utilities: Not At Risk (05/01/2024)   Epic    Threatened with loss of utilities: No  Health Literacy: Not on file     PHYSICAL EXAM  Vitals:   09/25/24 1053  BP: 132/79  Pulse: 70  Weight: 154 lb (69.9 kg)  Height: 5' 2 (1.575 m)     Body mass index is 28.17 kg/m.   General: The patient is  well-developed and well-nourished and in no acute distress.     Neck/HEENT:   She has mild tenderness in the cervical paraspinal muscles.      Extremities:  Mild pedal edema    Neurologic Exam  Mental status: The patient is alert and oriented x 3 at the time of the examination. The patient has apparent normal recent and remote memory, with an apparently normal attention span and concentration ability.   Speech is normal.  Cranial nerves: Extraocular movements are full.  . She reports reduced sensation in the right face.  Facial strength was normal.  The trapezius strength is normal.      Motor:  Muscle bulk is normal.   Tone is normal in arms and mildly increased in the legs..  Strength is 5/5 in the arms and legs.  Sensory: She has symmetric sensation to touch and vibration in the arms and legs/feet.  Coordination: Cerebellar testing shows good  finger-nose-finger and right heel-to-shin but mildly reduced left heel-to-shin (but has knee pain).  Gait and station: Station is normal.  The gait is mildly wide and has a mildly reduced stride.  Unable to do tandem walk.  Romberg is negative.  Reflexes: Deep tendon reflexes are symmetric and mildly increased in legs bilaterally.   No ankle clonus      ASSESSMENT AND PLAN  Active secondary progressive multiple sclerosis  High risk medication use  Ataxic gait  Other fatigue  Other chronic pain  Urinary urgency   1.    Continue Ocrevus , next infusion in September 2025.  IgG/IgM and CBC/Diff were fine 02/2024.  She needs these rechecked before September infusion - she prefers to do at PCP office as getting labs next week.  We also checked at that given her age around the time of the next visit we may want to also consider other options besides Ocrevus  that would have less infectious disease risk (i.e. teriflunomide or Mavenclad) 2.    Continue baclofen , Provigil  and oxybutynin . 3.  Stay active and exercise as tolerated.   Use cane for longer distance.  [employer] parking placard form filled out.   4.   She will return to see us  in 6 months or sooner if she has new or worsening neurologic symptoms.    Khylah Kendra A. Vear, MD, PhD, FAAN Certified in Neurology, Clinical Neurophysiology, Sleep Medicine, Pain Medicine and Neuroimaging Director, [facility] at [facility]  [facility] [address] [place], [place] [address] [phone] "

## 2024-09-29 ENCOUNTER — Ambulatory Visit: Admitting: Medical

## 2024-10-19 ENCOUNTER — Ambulatory Visit (HOSPITAL_COMMUNITY): Admit: 2024-10-19 | Admitting: Obstetrics & Gynecology

## 2025-03-30 ENCOUNTER — Ambulatory Visit: Admitting: Family Medicine
